# Patient Record
Sex: Female | Born: 1935 | Race: White | Hispanic: No | Smoking: Former smoker
Health system: Southern US, Community
[De-identification: ages and names within clinical notes are randomized; demographics above are authoritative.]

## PROBLEM LIST (undated history)

## (undated) DIAGNOSIS — T7840XA Allergy, unspecified, initial encounter: Secondary | ICD-10-CM

## (undated) DIAGNOSIS — J45909 Unspecified asthma, uncomplicated: Secondary | ICD-10-CM

## (undated) DIAGNOSIS — K5792 Diverticulitis of intestine, part unspecified, without perforation or abscess without bleeding: Secondary | ICD-10-CM

## (undated) DIAGNOSIS — R7881 Bacteremia: Secondary | ICD-10-CM

## (undated) DIAGNOSIS — B9620 Unspecified Escherichia coli [E. coli] as the cause of diseases classified elsewhere: Secondary | ICD-10-CM

## (undated) DIAGNOSIS — C801 Malignant (primary) neoplasm, unspecified: Secondary | ICD-10-CM

## (undated) DIAGNOSIS — S92354A Nondisplaced fracture of fifth metatarsal bone, right foot, initial encounter for closed fracture: Secondary | ICD-10-CM

## (undated) DIAGNOSIS — M069 Rheumatoid arthritis, unspecified: Secondary | ICD-10-CM

## (undated) DIAGNOSIS — J449 Chronic obstructive pulmonary disease, unspecified: Secondary | ICD-10-CM

## (undated) DIAGNOSIS — N390 Urinary tract infection, site not specified: Secondary | ICD-10-CM

## (undated) DIAGNOSIS — J189 Pneumonia, unspecified organism: Secondary | ICD-10-CM

## (undated) DIAGNOSIS — M797 Fibromyalgia: Secondary | ICD-10-CM

## (undated) DIAGNOSIS — J069 Acute upper respiratory infection, unspecified: Secondary | ICD-10-CM

## (undated) DIAGNOSIS — M1990 Unspecified osteoarthritis, unspecified site: Secondary | ICD-10-CM

## (undated) DIAGNOSIS — E2749 Other adrenocortical insufficiency: Secondary | ICD-10-CM

## (undated) DIAGNOSIS — L509 Urticaria, unspecified: Secondary | ICD-10-CM

## (undated) DIAGNOSIS — M810 Age-related osteoporosis without current pathological fracture: Secondary | ICD-10-CM

## (undated) DIAGNOSIS — H269 Unspecified cataract: Secondary | ICD-10-CM

## (undated) DIAGNOSIS — A419 Sepsis, unspecified organism: Secondary | ICD-10-CM

## (undated) HISTORY — DX: Allergy, unspecified, initial encounter: T78.40XA

## (undated) HISTORY — PX: BILATERAL CARPAL TUNNEL RELEASE: SHX6508

## (undated) HISTORY — PX: ROTATOR CUFF REPAIR: SHX139

## (undated) HISTORY — DX: Diverticulitis of intestine, part unspecified, without perforation or abscess without bleeding: K57.92

## (undated) HISTORY — DX: Acute upper respiratory infection, unspecified: J06.9

## (undated) HISTORY — DX: Urinary tract infection, site not specified: N39.0

## (undated) HISTORY — DX: Unspecified osteoarthritis, unspecified site: M19.90

## (undated) HISTORY — DX: Nondisplaced fracture of fifth metatarsal bone, right foot, initial encounter for closed fracture: S92.354A

## (undated) HISTORY — DX: Age-related osteoporosis without current pathological fracture: M81.0

## (undated) HISTORY — DX: Unspecified asthma, uncomplicated: J45.909

## (undated) HISTORY — DX: Urticaria, unspecified: L50.9

## (undated) HISTORY — DX: Malignant (primary) neoplasm, unspecified: C80.1

## (undated) HISTORY — DX: Other adrenocortical insufficiency: E27.49

## (undated) HISTORY — PX: SQUAMOUS CELL CARCINOMA EXCISION: SHX2433

## (undated) HISTORY — DX: Sepsis, unspecified organism: A41.9

## (undated) HISTORY — PX: TOTAL SHOULDER ARTHROPLASTY: SHX126

## (undated) HISTORY — DX: Rheumatoid arthritis, unspecified: M06.9

## (undated) HISTORY — PX: BACK SURGERY: SHX140

## (undated) HISTORY — PX: CERVICAL FUSION: SHX112

## (undated) HISTORY — DX: Fibromyalgia: M79.7

## (undated) HISTORY — DX: Pneumonia, unspecified organism: J18.9

## (undated) HISTORY — DX: Unspecified cataract: H26.9

---
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
## 2002-12-11 HISTORY — PX: CATARACT EXTRACTION, BILATERAL: SHX1313

## 2002-12-11 HISTORY — PX: HEEL SPUR SURGERY: SHX665

## 2003-12-12 HISTORY — PX: BILATERAL CARPAL TUNNEL RELEASE: SHX6508

## 2004-09-07 ENCOUNTER — Encounter: Admission: RE | Admit: 2004-09-07 | Discharge: 2004-09-07 | Payer: Self-pay | Admitting: Internal Medicine

## 2005-02-06 ENCOUNTER — Encounter: Admission: RE | Admit: 2005-02-06 | Discharge: 2005-02-06 | Payer: Self-pay | Admitting: Gastroenterology

## 2005-02-15 ENCOUNTER — Other Ambulatory Visit: Admission: RE | Admit: 2005-02-15 | Discharge: 2005-02-15 | Payer: Self-pay | Admitting: Obstetrics and Gynecology

## 2005-10-11 ENCOUNTER — Encounter: Admission: RE | Admit: 2005-10-11 | Discharge: 2005-10-11 | Payer: Self-pay | Admitting: Internal Medicine

## 2006-03-21 ENCOUNTER — Other Ambulatory Visit: Admission: RE | Admit: 2006-03-21 | Discharge: 2006-03-21 | Payer: Self-pay | Admitting: Obstetrics and Gynecology

## 2006-10-22 ENCOUNTER — Encounter: Admission: RE | Admit: 2006-10-22 | Discharge: 2006-10-22 | Payer: Self-pay | Admitting: Internal Medicine

## 2006-12-11 DIAGNOSIS — M797 Fibromyalgia: Secondary | ICD-10-CM

## 2006-12-11 HISTORY — DX: Fibromyalgia: M79.7

## 2007-10-24 ENCOUNTER — Encounter: Admission: RE | Admit: 2007-10-24 | Discharge: 2007-10-24 | Payer: Self-pay | Admitting: Internal Medicine

## 2009-07-21 ENCOUNTER — Encounter: Admission: RE | Admit: 2009-07-21 | Discharge: 2009-07-21 | Payer: Self-pay

## 2009-08-06 ENCOUNTER — Encounter: Admission: RE | Admit: 2009-08-06 | Discharge: 2009-08-06 | Payer: Self-pay | Admitting: Neurosurgery

## 2009-10-14 ENCOUNTER — Encounter: Admission: RE | Admit: 2009-10-14 | Discharge: 2009-10-14 | Payer: Self-pay | Admitting: Neurosurgery

## 2009-12-11 HISTORY — PX: OTHER SURGICAL HISTORY: SHX169

## 2009-12-27 ENCOUNTER — Encounter: Admission: RE | Admit: 2009-12-27 | Discharge: 2009-12-27 | Payer: Self-pay | Admitting: Rheumatology

## 2010-04-19 ENCOUNTER — Inpatient Hospital Stay (HOSPITAL_COMMUNITY): Admission: RE | Admit: 2010-04-19 | Discharge: 2010-04-22 | Payer: Self-pay | Admitting: Neurosurgery

## 2010-07-25 ENCOUNTER — Encounter: Admission: RE | Admit: 2010-07-25 | Discharge: 2010-10-23 | Payer: Self-pay | Admitting: Neurosurgery

## 2010-09-16 ENCOUNTER — Encounter
Admission: RE | Admit: 2010-09-16 | Discharge: 2010-09-16 | Payer: Self-pay | Source: Home / Self Care | Admitting: Anesthesiology

## 2010-12-08 ENCOUNTER — Ambulatory Visit (HOSPITAL_COMMUNITY)
Admission: RE | Admit: 2010-12-08 | Discharge: 2010-12-08 | Payer: Self-pay | Source: Home / Self Care | Attending: Anesthesiology | Admitting: Anesthesiology

## 2010-12-29 ENCOUNTER — Encounter
Admission: RE | Admit: 2010-12-29 | Discharge: 2011-01-10 | Payer: Self-pay | Source: Home / Self Care | Attending: Neurosurgery | Admitting: Neurosurgery

## 2011-01-12 ENCOUNTER — Encounter: Payer: Self-pay | Admitting: Physical Therapy

## 2011-01-16 ENCOUNTER — Encounter: Payer: Medicare Other | Admitting: Physical Therapy

## 2011-01-18 ENCOUNTER — Encounter: Payer: Medicare Other | Admitting: Physical Therapy

## 2011-01-23 ENCOUNTER — Encounter: Payer: Medicare Other | Admitting: Physical Therapy

## 2011-01-25 ENCOUNTER — Encounter: Payer: Medicare Other | Admitting: Physical Therapy

## 2011-01-30 ENCOUNTER — Encounter: Payer: Medicare Other | Admitting: Physical Therapy

## 2011-02-01 ENCOUNTER — Encounter: Payer: Medicare Other | Admitting: Physical Therapy

## 2011-02-28 LAB — DIFFERENTIAL
Basophils Absolute: 0.1 10*3/uL (ref 0.0–0.1)
Basophils Relative: 1 % (ref 0–1)
Eosinophils Absolute: 0.2 10*3/uL (ref 0.0–0.7)
Eosinophils Relative: 2 % (ref 0–5)
Lymphocytes Relative: 18 % (ref 12–46)
Lymphs Abs: 1.9 10*3/uL (ref 0.7–4.0)
Monocytes Absolute: 0.8 10*3/uL (ref 0.1–1.0)
Monocytes Relative: 7 % (ref 3–12)
Neutro Abs: 8 10*3/uL — ABNORMAL HIGH (ref 1.7–7.7)
Neutrophils Relative %: 73 % (ref 43–77)

## 2011-02-28 LAB — SURGICAL PCR SCREEN
MRSA, PCR: NEGATIVE
Staphylococcus aureus: NEGATIVE

## 2011-02-28 LAB — URINALYSIS, ROUTINE W REFLEX MICROSCOPIC
Bilirubin Urine: NEGATIVE
Glucose, UA: NEGATIVE mg/dL
Ketones, ur: NEGATIVE mg/dL
Leukocytes, UA: NEGATIVE
Nitrite: NEGATIVE
Protein, ur: NEGATIVE mg/dL
Specific Gravity, Urine: 1.007 (ref 1.005–1.030)
Urobilinogen, UA: 0.2 mg/dL (ref 0.0–1.0)
pH: 7.5 (ref 5.0–8.0)

## 2011-02-28 LAB — CBC
HCT: 37 % (ref 36.0–46.0)
Hemoglobin: 13 g/dL (ref 12.0–15.0)
MCHC: 35 g/dL (ref 30.0–36.0)
MCV: 96.7 fL (ref 78.0–100.0)
Platelets: 298 10*3/uL (ref 150–400)
RBC: 3.83 MIL/uL — ABNORMAL LOW (ref 3.87–5.11)
RDW: 14.9 % (ref 11.5–15.5)
WBC: 10.9 10*3/uL — ABNORMAL HIGH (ref 4.0–10.5)

## 2011-02-28 LAB — MRSA PCR SCREENING: MRSA by PCR: NEGATIVE

## 2011-02-28 LAB — COMPREHENSIVE METABOLIC PANEL
ALT: 21 U/L (ref 0–35)
AST: 26 U/L (ref 0–37)
Albumin: 4 g/dL (ref 3.5–5.2)
Alkaline Phosphatase: 69 U/L (ref 39–117)
BUN: 17 mg/dL (ref 6–23)
CO2: 36 mEq/L — ABNORMAL HIGH (ref 19–32)
Calcium: 9.5 mg/dL (ref 8.4–10.5)
Chloride: 100 mEq/L (ref 96–112)
Creatinine, Ser: 0.81 mg/dL (ref 0.4–1.2)
GFR calc Af Amer: >60 mL/min (ref >60)
GFR calc non Af Amer: >60 mL/min (ref >60)
Glucose, Bld: 102 mg/dL — ABNORMAL HIGH (ref 70–99)
Potassium: 4.1 mEq/L (ref 3.5–5.1)
Sodium: 139 mEq/L (ref 135–145)
Total Bilirubin: 0.8 mg/dL (ref 0.3–1.2)
Total Protein: 7.6 g/dL (ref 6.0–8.3)

## 2011-02-28 LAB — PROTIME-INR
INR: 0.94 (ref 0.00–1.49)
Prothrombin Time: 12.5 seconds (ref 11.6–15.2)

## 2011-02-28 LAB — URINE MICROSCOPIC-ADD ON

## 2011-02-28 LAB — NO BLOOD PRODUCTS

## 2011-02-28 LAB — APTT: aPTT: 29 seconds (ref 24–37)

## 2011-03-28 ENCOUNTER — Ambulatory Visit: Payer: Medicare Other | Attending: Neurosurgery

## 2011-03-28 DIAGNOSIS — M545 Low back pain, unspecified: Secondary | ICD-10-CM | POA: Insufficient documentation

## 2011-03-28 DIAGNOSIS — M2560 Stiffness of unspecified joint, not elsewhere classified: Secondary | ICD-10-CM | POA: Insufficient documentation

## 2011-03-28 DIAGNOSIS — IMO0001 Reserved for inherently not codable concepts without codable children: Secondary | ICD-10-CM | POA: Insufficient documentation

## 2011-03-28 DIAGNOSIS — M542 Cervicalgia: Secondary | ICD-10-CM | POA: Insufficient documentation

## 2011-03-28 DIAGNOSIS — R5381 Other malaise: Secondary | ICD-10-CM | POA: Insufficient documentation

## 2011-03-29 ENCOUNTER — Ambulatory Visit: Payer: Medicare Other | Admitting: Physical Therapy

## 2011-04-06 ENCOUNTER — Ambulatory Visit: Payer: Medicare Other

## 2011-04-11 ENCOUNTER — Ambulatory Visit: Payer: Medicare Other | Attending: Neurosurgery

## 2011-04-11 DIAGNOSIS — M545 Low back pain, unspecified: Secondary | ICD-10-CM | POA: Insufficient documentation

## 2011-04-11 DIAGNOSIS — R5381 Other malaise: Secondary | ICD-10-CM | POA: Insufficient documentation

## 2011-04-11 DIAGNOSIS — IMO0001 Reserved for inherently not codable concepts without codable children: Secondary | ICD-10-CM | POA: Insufficient documentation

## 2011-04-11 DIAGNOSIS — M542 Cervicalgia: Secondary | ICD-10-CM | POA: Insufficient documentation

## 2011-04-11 DIAGNOSIS — M2560 Stiffness of unspecified joint, not elsewhere classified: Secondary | ICD-10-CM | POA: Insufficient documentation

## 2011-04-13 ENCOUNTER — Ambulatory Visit: Payer: Medicare Other

## 2011-04-18 ENCOUNTER — Ambulatory Visit: Payer: Medicare Other

## 2011-04-20 ENCOUNTER — Ambulatory Visit: Payer: Medicare Other

## 2011-04-25 ENCOUNTER — Ambulatory Visit: Payer: Medicare Other

## 2011-04-27 ENCOUNTER — Ambulatory Visit: Payer: Medicare Other

## 2011-05-03 ENCOUNTER — Ambulatory Visit: Payer: Medicare Other

## 2011-05-04 ENCOUNTER — Ambulatory Visit: Payer: Medicare Other

## 2011-05-09 ENCOUNTER — Ambulatory Visit: Payer: Medicare Other

## 2011-05-11 ENCOUNTER — Ambulatory Visit: Payer: Medicare Other | Attending: Rheumatology

## 2011-05-11 ENCOUNTER — Encounter: Payer: Medicare Other | Admitting: Physical Therapy

## 2011-05-11 DIAGNOSIS — IMO0001 Reserved for inherently not codable concepts without codable children: Secondary | ICD-10-CM | POA: Insufficient documentation

## 2011-05-11 DIAGNOSIS — M25659 Stiffness of unspecified hip, not elsewhere classified: Secondary | ICD-10-CM | POA: Insufficient documentation

## 2011-05-11 DIAGNOSIS — M25569 Pain in unspecified knee: Secondary | ICD-10-CM | POA: Insufficient documentation

## 2011-05-11 DIAGNOSIS — R5381 Other malaise: Secondary | ICD-10-CM | POA: Insufficient documentation

## 2011-05-15 ENCOUNTER — Ambulatory Visit: Payer: Medicare Other | Attending: Neurosurgery | Admitting: Physical Therapy

## 2011-05-15 DIAGNOSIS — M542 Cervicalgia: Secondary | ICD-10-CM | POA: Insufficient documentation

## 2011-05-15 DIAGNOSIS — IMO0001 Reserved for inherently not codable concepts without codable children: Secondary | ICD-10-CM | POA: Insufficient documentation

## 2011-05-15 DIAGNOSIS — M545 Low back pain, unspecified: Secondary | ICD-10-CM | POA: Insufficient documentation

## 2011-05-15 DIAGNOSIS — R5381 Other malaise: Secondary | ICD-10-CM | POA: Insufficient documentation

## 2011-05-15 DIAGNOSIS — M2560 Stiffness of unspecified joint, not elsewhere classified: Secondary | ICD-10-CM | POA: Insufficient documentation

## 2011-05-18 ENCOUNTER — Ambulatory Visit: Payer: Medicare Other

## 2011-05-25 ENCOUNTER — Ambulatory Visit: Payer: Medicare Other

## 2011-05-29 ENCOUNTER — Ambulatory Visit: Payer: Medicare Other | Admitting: Physical Therapy

## 2011-06-01 ENCOUNTER — Ambulatory Visit: Payer: Medicare Other | Admitting: Physical Therapy

## 2011-06-05 ENCOUNTER — Ambulatory Visit: Payer: Medicare Other | Admitting: Physical Therapy

## 2011-06-08 ENCOUNTER — Ambulatory Visit: Payer: Medicare Other

## 2011-06-12 ENCOUNTER — Encounter (HOSPITAL_COMMUNITY): Payer: Medicare Other | Attending: Rheumatology

## 2011-06-12 DIAGNOSIS — M069 Rheumatoid arthritis, unspecified: Secondary | ICD-10-CM | POA: Insufficient documentation

## 2011-06-26 ENCOUNTER — Encounter (HOSPITAL_COMMUNITY): Payer: Medicare Other

## 2011-07-10 ENCOUNTER — Encounter (HOSPITAL_COMMUNITY): Payer: Medicare Other

## 2011-08-10 ENCOUNTER — Encounter (HOSPITAL_COMMUNITY): Payer: Medicare Other | Attending: Rheumatology

## 2011-08-10 DIAGNOSIS — M069 Rheumatoid arthritis, unspecified: Secondary | ICD-10-CM | POA: Insufficient documentation

## 2011-08-31 ENCOUNTER — Other Ambulatory Visit: Payer: Self-pay | Admitting: Internal Medicine

## 2011-08-31 DIAGNOSIS — Z1231 Encounter for screening mammogram for malignant neoplasm of breast: Secondary | ICD-10-CM

## 2011-09-11 ENCOUNTER — Encounter (HOSPITAL_COMMUNITY): Payer: Medicare Other | Attending: Rheumatology

## 2011-09-11 DIAGNOSIS — M069 Rheumatoid arthritis, unspecified: Secondary | ICD-10-CM | POA: Insufficient documentation

## 2011-09-19 ENCOUNTER — Ambulatory Visit
Admission: RE | Admit: 2011-09-19 | Discharge: 2011-09-19 | Disposition: A | Discharge status: Home / Self Care | Payer: Medicare Other | Source: Ambulatory Visit | Attending: Internal Medicine | Admitting: Internal Medicine

## 2011-09-19 DIAGNOSIS — Z1231 Encounter for screening mammogram for malignant neoplasm of breast: Secondary | ICD-10-CM

## 2011-10-09 ENCOUNTER — Encounter (HOSPITAL_COMMUNITY): Payer: Medicare Other

## 2011-10-31 ENCOUNTER — Other Ambulatory Visit (HOSPITAL_COMMUNITY): Payer: Self-pay | Admitting: *Deleted

## 2011-11-06 ENCOUNTER — Encounter (HOSPITAL_COMMUNITY): Payer: Medicare Other

## 2012-03-12 ENCOUNTER — Encounter: Payer: Self-pay | Admitting: Physical Therapy

## 2012-03-12 ENCOUNTER — Ambulatory Visit: Payer: Medicare Other | Attending: Rheumatology | Admitting: *Deleted

## 2012-03-12 DIAGNOSIS — IMO0001 Reserved for inherently not codable concepts without codable children: Secondary | ICD-10-CM | POA: Insufficient documentation

## 2012-03-12 DIAGNOSIS — M2560 Stiffness of unspecified joint, not elsewhere classified: Secondary | ICD-10-CM | POA: Insufficient documentation

## 2012-03-12 DIAGNOSIS — R5381 Other malaise: Secondary | ICD-10-CM | POA: Insufficient documentation

## 2012-03-12 DIAGNOSIS — M6281 Muscle weakness (generalized): Secondary | ICD-10-CM | POA: Insufficient documentation

## 2012-03-14 ENCOUNTER — Ambulatory Visit: Payer: Medicare Other | Admitting: *Deleted

## 2012-03-18 ENCOUNTER — Ambulatory Visit: Payer: Medicare Other | Admitting: *Deleted

## 2012-03-20 ENCOUNTER — Ambulatory Visit: Payer: Medicare Other | Admitting: *Deleted

## 2012-03-25 ENCOUNTER — Ambulatory Visit: Payer: Medicare Other | Admitting: Physical Therapy

## 2012-03-28 ENCOUNTER — Ambulatory Visit: Payer: Medicare Other | Admitting: *Deleted

## 2012-04-01 ENCOUNTER — Ambulatory Visit: Payer: Medicare Other | Admitting: Physical Therapy

## 2012-04-03 ENCOUNTER — Ambulatory Visit: Payer: Medicare Other | Admitting: Physical Therapy

## 2012-04-08 ENCOUNTER — Ambulatory Visit: Payer: Medicare Other | Admitting: Physical Therapy

## 2012-04-10 ENCOUNTER — Ambulatory Visit: Payer: Medicare Other | Attending: Rheumatology | Admitting: Physical Therapy

## 2012-04-10 DIAGNOSIS — M6281 Muscle weakness (generalized): Secondary | ICD-10-CM | POA: Insufficient documentation

## 2012-04-10 DIAGNOSIS — IMO0001 Reserved for inherently not codable concepts without codable children: Secondary | ICD-10-CM | POA: Insufficient documentation

## 2012-04-10 DIAGNOSIS — R5381 Other malaise: Secondary | ICD-10-CM | POA: Insufficient documentation

## 2012-04-10 DIAGNOSIS — M2560 Stiffness of unspecified joint, not elsewhere classified: Secondary | ICD-10-CM | POA: Insufficient documentation

## 2012-04-15 ENCOUNTER — Ambulatory Visit: Payer: Medicare Other | Admitting: Physical Therapy

## 2012-04-17 ENCOUNTER — Ambulatory Visit: Payer: Medicare Other | Admitting: Physical Therapy

## 2012-04-22 ENCOUNTER — Ambulatory Visit: Payer: Medicare Other | Admitting: Physical Therapy

## 2012-04-24 ENCOUNTER — Ambulatory Visit: Payer: Medicare Other | Admitting: Physical Therapy

## 2012-04-29 ENCOUNTER — Ambulatory Visit: Payer: Medicare Other | Admitting: Physical Therapy

## 2012-05-01 ENCOUNTER — Ambulatory Visit: Payer: Medicare Other | Admitting: Physical Therapy

## 2012-08-30 ENCOUNTER — Other Ambulatory Visit: Payer: Self-pay | Admitting: Internal Medicine

## 2012-08-30 DIAGNOSIS — Z1231 Encounter for screening mammogram for malignant neoplasm of breast: Secondary | ICD-10-CM

## 2012-09-24 ENCOUNTER — Ambulatory Visit
Admission: RE | Admit: 2012-09-24 | Discharge: 2012-09-24 | Disposition: A | Discharge status: Home / Self Care | Payer: Medicare Other | Source: Ambulatory Visit | Attending: Internal Medicine | Admitting: Internal Medicine

## 2012-09-24 DIAGNOSIS — Z1231 Encounter for screening mammogram for malignant neoplasm of breast: Secondary | ICD-10-CM

## 2012-10-30 ENCOUNTER — Telehealth: Payer: Self-pay | Admitting: Internal Medicine

## 2012-10-30 NOTE — Telephone Encounter (Signed)
S/W PT IN REF TO NP APPT. ON 11/13/12@1 :30 REFERRING DR Corliss Skains DX-LYMPHOCYTOSIS MAILED NP PACKET

## 2012-10-31 ENCOUNTER — Telehealth: Payer: Self-pay | Admitting: Internal Medicine

## 2012-10-31 NOTE — Telephone Encounter (Signed)
C/D 10/31/12 for appt.11/23/12

## 2012-11-13 ENCOUNTER — Encounter: Payer: Self-pay | Admitting: Internal Medicine

## 2012-11-13 ENCOUNTER — Other Ambulatory Visit (HOSPITAL_BASED_OUTPATIENT_CLINIC_OR_DEPARTMENT_OTHER): Payer: Medicare Other | Admitting: Lab

## 2012-11-13 ENCOUNTER — Ambulatory Visit: Payer: Medicare Other

## 2012-11-13 ENCOUNTER — Ambulatory Visit (HOSPITAL_BASED_OUTPATIENT_CLINIC_OR_DEPARTMENT_OTHER): Payer: Medicare Other | Admitting: Internal Medicine

## 2012-11-13 VITALS — BP 134/86 | HR 83 | Temp 97.2°F | Resp 18 | Ht 65.0 in | Wt 166.2 lb

## 2012-11-13 DIAGNOSIS — M069 Rheumatoid arthritis, unspecified: Secondary | ICD-10-CM

## 2012-11-13 DIAGNOSIS — M810 Age-related osteoporosis without current pathological fracture: Secondary | ICD-10-CM

## 2012-11-13 DIAGNOSIS — D72820 Lymphocytosis (symptomatic): Secondary | ICD-10-CM

## 2012-11-13 DIAGNOSIS — D72829 Elevated white blood cell count, unspecified: Secondary | ICD-10-CM | POA: Insufficient documentation

## 2012-11-13 LAB — CBC WITH DIFFERENTIAL/PLATELET
BASO%: 0.9 % (ref 0.0–2.0)
Basophils Absolute: 0.1 10*3/uL (ref 0.0–0.1)
EOS%: 0.1 % (ref 0.0–7.0)
Eosinophils Absolute: 0 10*3/uL (ref 0.0–0.5)
HCT: 38.4 % (ref 34.8–46.6)
HGB: 12.9 g/dL (ref 11.6–15.9)
LYMPH%: 21.6 % (ref 14.0–49.7)
MCH: 34.2 pg — ABNORMAL HIGH (ref 25.1–34.0)
MCHC: 33.4 g/dL (ref 31.5–36.0)
MCV: 102.2 fL — ABNORMAL HIGH (ref 79.5–101.0)
MONO#: 0.3 10*3/uL (ref 0.1–0.9)
MONO%: 3.4 % (ref 0.0–14.0)
NEUT#: 6.5 10*3/uL (ref 1.5–6.5)
NEUT%: 74 % (ref 38.4–76.8)
Platelets: 310 10*3/uL (ref 145–400)
RBC: 3.76 10*6/uL (ref 3.70–5.45)
RDW: 14.2 % (ref 11.2–14.5)
WBC: 8.8 10*3/uL (ref 3.9–10.3)
lymph#: 1.9 10*3/uL (ref 0.9–3.3)

## 2012-11-13 NOTE — Progress Notes (Signed)
Checked in new pt with no financial concerns. °

## 2012-11-13 NOTE — Patient Instructions (Signed)
Your lymphocytosis resolved. Followup with your family doctor

## 2012-11-13 NOTE — Progress Notes (Signed)
[facility] Telephone:([phone]) [phone]   Fax:([phone]) [phone]  CONSULT NOTE  REASON FOR CONSULTATION:  78 years old white female here for evaluation of lymphocytosis.  HPI Eileen Wilkerson is a 78 y.o. female was past medical history significant for rheumatoid arthritis, fibromyalgia, adrenal insufficiency, dyslipidemia and osteoporosis. The patient was followed by Dr. Corliss Skains for rheumatoid arthritis and fibromyalgia. The patient has been on treatment with methotrexate as well as Humira for several months. 2 months ago she had bronchitis and her treatment for the rheumatoid arthritis was discontinued. She had flare of her symptoms and was started again on treatment with methotrexate and Enbrel for the last 6 weeks. Routine CBC on 08/13/2012 and it showed normal total white blood count of 7.4 but the absolute lymphocyte count was 5000 with normal neutrophil count of 1700 but lower total percentage of 23%. Repeat CBC on 10/22/2012 showed that total white blood count of 9.0 with absolute lymphocyte count of 6400 representing 71% of the total white blood count. The patient was referred to me today for evaluation and recommendation regarding her lymphocytosis. She is currently on treatment with methotrexate,Ebrel and prednisone for her rheumatoid arthritis. The patient also takes folic acid on daily basis. She still complaining of joint pain all over her body. She also has lack of energy. She also has some intermittent chest pain but she related mainly to her hiatal hernia. The patient denied having any significant weight loss or night sweats. She has no bleeding issues. Her family history significant for a father with heart disease and diabetes mellitus. Her mother had breast cancer. The patient is married and has 3 children. 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 She has a remote history of smoking for few years. She denied having any history of alcohol or drug abuse. The patient has a  big listt of allergy to medication as listed below.   @SFHPI @  Past Medical History  Diagnosis Date  . Cataract   . Osteoporosis   . Arthritis   . Adrenal failure   . Fibromyalgia 2008    Past Surgical History  Procedure Date  . Cervical fusion 2011,2010,2008    c 3--4,4-5, lower back  . Carpel tunnel release 2005    bilateral wrists  . Heel spur surgery 2004  . Cataract extraction, bilateral 2004  . Rotater cuff [id number]  . Tonsillectomy and adenoidectomy 1947  . Back surgery     No family history on file.  Social History History  Substance Use Topics  . Smoking status: Not on file  . Smokeless tobacco: Not on file  . Alcohol Use: Not on file    Allergies  Allergen Reactions  . Adhesive (Tape) Rash  . Celebrex (Celecoxib) Hives  . Cymbalta (Duloxetine Hcl) Swelling  . Gabitril (Tiagabine) Swelling  . Keflex (Cephalexin) Nausea And Vomiting  . Lyrica (Pregabalin) Swelling  . Neurontin (Gabapentin) Swelling  . Nexium (Esomeprazole) Rash  . Nsaids Rash  . Penicillins Rash    Injection site reaction  . Shrimp (Shellfish Allergy) Anaphylaxis  . Sulfa Antibiotics Other (See Comments)    "deathly ill"/nausea    Current Outpatient Prescriptions  Medication Sig Dispense Refill  . acetaminophen (TYLENOL) 325 MG tablet Take 650 mg by mouth 2 (two) times daily.      Marland Kitchen atorvastatin (LIPITOR) 20 MG tablet Take 20 mg by mouth At bedtime.      Marland Kitchen BIOTIN 5000 PO Take 1 tablet by mouth 2 (two) times daily.      Marland Kitchen  calcium carbonate (OS-CAL) 600 MG TABS Take 600 mg by mouth 2 (two) times daily with a meal.      . co-enzyme Q-10 30 MG capsule Take 100 mg by mouth daily. Take 2 tablets daily      . ENBREL SURECLICK 50 MG/ML injection Inject 50 mg into the muscle Once a week.      . folic acid (FOLVITE) 1 MG tablet Take 1 mg by mouth Daily.      . furosemide (LASIX) 40 MG tablet Take 60 mg by mouth Daily.      . methocarbamol (ROBAXIN) 500 MG tablet Take 500 mg by mouth 3  (three) times daily.       . methotrexate 25 MG/ML injection Inject 0.8 mLs into the skin Once a week.      . predniSONE (DELTASONE) 5 MG tablet Take 10 mg by mouth Daily.      Marland Kitchen PREVACID 24HR 15 MG capsule Take 15 mg by mouth Daily.      Marland Kitchen SAVELLA 50 MG TABS Take 50 mg by mouth 2 (two) times daily.       . traMADol (ULTRAM) 50 MG tablet Take 50 mg by mouth Twice daily.      . Zoledronic Acid (RECLAST IV) Inject into the vein. Once a year        Review of Systems  A comprehensive review of systems was negative except for: Constitutional: positive for fatigue Musculoskeletal: positive for arthralgias, myalgias and stiff joints  Physical Exam  ZOX:WRUEA, healthy, no distress, well nourished and well developed SKIN: skin color, texture, turgor are normal HEAD: Normocephalic, No masses, lesions, tenderness or abnormalities EYES: normal, PERRLA EARS: External ears normal OROPHARYNX:no exudate and no erythema  NECK: supple, no adenopathy LYMPH:  no palpable lymphadenopathy, no hepatosplenomegaly BREAST:not examined LUNGS: clear to auscultation  HEART: regular rate & rhythm and no murmurs ABDOMEN:abdomen soft and non-tender BACK: Back symmetric, no curvature. EXTREMITIES:no edema, no skin discoloration, no clubbing  NEURO: alert & oriented x 3 with fluent speech, no focal motor/sensory deficits  PERFORMANCE STATUS: ECOG 1  LABORATORY DATA: Lab Results  Component Value Date   WBC 8.8 11/13/2012   HGB 12.9 11/13/2012   HCT 38.4 11/13/2012   MCV 102.2* 11/13/2012   PLT 310 11/13/2012      Chemistry      Component Value Date/Time   NA 139 04/12/2010 1120   K 4.1 04/12/2010 1120   CL 100 04/12/2010 1120   CO2 36* 04/12/2010 1120   BUN 17 04/12/2010 1120   CREATININE 0.81 04/12/2010 1120      Component Value Date/Time   CALCIUM 9.5 04/12/2010 1120   ALKPHOS 69 04/12/2010 1120   AST 26 04/12/2010 1120   ALT 21 04/12/2010 1120   BILITOT 0.8 04/12/2010 1120       RADIOGRAPHIC STUDIES: No  results found.  ASSESSMENT: This is a very pleasant 78 years old white female who presented for evaluation of lymphocytosis which completely resolved at this point and this was most likely secondary to her recent upper respiratory infection plus/minus drug-induced from her rheumatoid arthritis treatment. Her lymphocytosis completely resolved at this time.  PLAN: I discussed the lab result with the patient. I recommended for her to continue her routine care with Dr. Corliss Skains and her primary care physician. I don't see a need for any further intervention at this point as her condition resolved. I would be happy to see her in the future if needed. The patient was  advised to call me immediately if she has any other significant abnormalities that require hematologic evaluation. The patient agreed to the current plan. She was given a copy of her blood work today.  All questions were answered. The patient knows to call the clinic with any problems, questions or concerns. We can certainly see the patient much sooner if necessary.  Thank you so much for allowing me to participate in the care of Eileen Wilkerson. I will continue to follow up the patient with you and assist in her care.  I spent 25 minutes counseling the patient face to face. The total time spent in the appointment was 50 minutes.  Rodolfo Gaster K. 11/13/2012, 2:59 PM

## 2013-07-22 ENCOUNTER — Other Ambulatory Visit: Payer: Self-pay | Admitting: Endocrinology

## 2013-07-22 DIAGNOSIS — E049 Nontoxic goiter, unspecified: Secondary | ICD-10-CM

## 2013-07-29 ENCOUNTER — Ambulatory Visit
Admission: RE | Admit: 2013-07-29 | Discharge: 2013-07-29 | Disposition: A | Discharge status: Home / Self Care | Payer: Medicare Other | Source: Ambulatory Visit | Attending: Endocrinology | Admitting: Endocrinology

## 2013-07-29 DIAGNOSIS — E049 Nontoxic goiter, unspecified: Secondary | ICD-10-CM

## 2013-08-27 ENCOUNTER — Other Ambulatory Visit: Payer: Self-pay

## 2013-08-27 DIAGNOSIS — Z1231 Encounter for screening mammogram for malignant neoplasm of breast: Secondary | ICD-10-CM

## 2013-09-25 ENCOUNTER — Ambulatory Visit
Admission: RE | Admit: 2013-09-25 | Discharge: 2013-09-25 | Disposition: A | Discharge status: Home / Self Care | Payer: Medicare Other | Source: Ambulatory Visit

## 2013-09-25 DIAGNOSIS — Z1231 Encounter for screening mammogram for malignant neoplasm of breast: Secondary | ICD-10-CM

## 2014-09-14 ENCOUNTER — Other Ambulatory Visit: Payer: Self-pay

## 2014-09-14 DIAGNOSIS — Z1239 Encounter for other screening for malignant neoplasm of breast: Secondary | ICD-10-CM

## 2014-09-29 ENCOUNTER — Other Ambulatory Visit: Payer: Self-pay

## 2014-09-29 DIAGNOSIS — Z1231 Encounter for screening mammogram for malignant neoplasm of breast: Secondary | ICD-10-CM

## 2014-10-01 ENCOUNTER — Ambulatory Visit
Admission: RE | Admit: 2014-10-01 | Discharge: 2014-10-01 | Disposition: A | Discharge status: Home / Self Care | Payer: 59 | Source: Ambulatory Visit

## 2014-10-01 DIAGNOSIS — Z1231 Encounter for screening mammogram for malignant neoplasm of breast: Secondary | ICD-10-CM

## 2014-10-26 ENCOUNTER — Other Ambulatory Visit (HOSPITAL_COMMUNITY): Payer: Self-pay | Admitting: Internal Medicine

## 2014-10-26 ENCOUNTER — Ambulatory Visit (HOSPITAL_COMMUNITY)
Admission: RE | Admit: 2014-10-26 | Discharge: 2014-10-26 | Disposition: A | Discharge status: Home / Self Care | Payer: Medicare Other | Source: Ambulatory Visit | Attending: Internal Medicine | Admitting: Internal Medicine

## 2014-10-26 DIAGNOSIS — H538 Other visual disturbances: Secondary | ICD-10-CM | POA: Diagnosis present

## 2014-10-26 DIAGNOSIS — I739 Peripheral vascular disease, unspecified: Secondary | ICD-10-CM | POA: Insufficient documentation

## 2014-10-26 DIAGNOSIS — R42 Dizziness and giddiness: Secondary | ICD-10-CM | POA: Diagnosis present

## 2014-10-26 DIAGNOSIS — G319 Degenerative disease of nervous system, unspecified: Secondary | ICD-10-CM | POA: Diagnosis not present

## 2014-10-26 DIAGNOSIS — H9190 Unspecified hearing loss, unspecified ear: Secondary | ICD-10-CM | POA: Diagnosis present

## 2014-10-26 DIAGNOSIS — S0990XA Unspecified injury of head, initial encounter: Secondary | ICD-10-CM

## 2015-01-06 ENCOUNTER — Other Ambulatory Visit: Payer: Self-pay | Admitting: Specialist

## 2015-01-06 DIAGNOSIS — M545 Low back pain: Secondary | ICD-10-CM

## 2015-01-06 DIAGNOSIS — M542 Cervicalgia: Secondary | ICD-10-CM

## 2015-01-19 ENCOUNTER — Ambulatory Visit
Admission: RE | Admit: 2015-01-19 | Discharge: 2015-01-19 | Disposition: A | Discharge status: Home / Self Care | Payer: PRIVATE HEALTH INSURANCE | Source: Ambulatory Visit | Attending: Specialist | Admitting: Specialist

## 2015-01-19 ENCOUNTER — Other Ambulatory Visit: Payer: Self-pay

## 2015-01-19 DIAGNOSIS — M545 Low back pain: Secondary | ICD-10-CM

## 2015-01-19 DIAGNOSIS — M542 Cervicalgia: Secondary | ICD-10-CM

## 2015-01-19 MED ORDER — GADOBENATE DIMEGLUMINE 529 MG/ML IV SOLN
17.0000 mL | Freq: Once | INTRAVENOUS | Status: AC | PRN
  Administered 2015-01-19: 17 mL via INTRAVENOUS

## 2015-02-08 ENCOUNTER — Other Ambulatory Visit: Payer: Self-pay | Admitting: Orthopedic Surgery

## 2015-02-08 DIAGNOSIS — M25512 Pain in left shoulder: Secondary | ICD-10-CM

## 2015-02-13 ENCOUNTER — Ambulatory Visit
Admission: RE | Admit: 2015-02-13 | Discharge: 2015-02-13 | Disposition: A | Discharge status: Home / Self Care | Payer: PRIVATE HEALTH INSURANCE | Source: Ambulatory Visit | Attending: Orthopedic Surgery | Admitting: Orthopedic Surgery

## 2015-02-13 DIAGNOSIS — M25512 Pain in left shoulder: Secondary | ICD-10-CM

## 2015-04-19 ENCOUNTER — Other Ambulatory Visit: Payer: Self-pay | Admitting: Neurosurgery

## 2015-04-19 DIAGNOSIS — M48061 Spinal stenosis, lumbar region without neurogenic claudication: Secondary | ICD-10-CM

## 2015-04-21 ENCOUNTER — Ambulatory Visit
Admission: RE | Admit: 2015-04-21 | Discharge: 2015-04-21 | Disposition: A | Discharge status: Home / Self Care | Payer: PRIVATE HEALTH INSURANCE | Source: Ambulatory Visit | Attending: Neurosurgery | Admitting: Neurosurgery

## 2015-04-21 DIAGNOSIS — M48061 Spinal stenosis, lumbar region without neurogenic claudication: Secondary | ICD-10-CM

## 2015-08-30 ENCOUNTER — Other Ambulatory Visit: Payer: Self-pay

## 2015-08-30 DIAGNOSIS — Z1231 Encounter for screening mammogram for malignant neoplasm of breast: Secondary | ICD-10-CM

## 2015-10-05 ENCOUNTER — Ambulatory Visit
Admission: RE | Admit: 2015-10-05 | Discharge: 2015-10-05 | Disposition: A | Discharge status: Home / Self Care | Payer: Medicare Other | Source: Ambulatory Visit

## 2015-10-05 DIAGNOSIS — Z1231 Encounter for screening mammogram for malignant neoplasm of breast: Secondary | ICD-10-CM

## 2015-12-28 ENCOUNTER — Emergency Department (HOSPITAL_COMMUNITY): Payer: Medicare Other

## 2015-12-28 ENCOUNTER — Encounter (HOSPITAL_COMMUNITY): Payer: Self-pay | Admitting: Emergency Medicine

## 2015-12-28 ENCOUNTER — Inpatient Hospital Stay (HOSPITAL_COMMUNITY)
Admission: EM | Admit: 2015-12-28 | Discharge: 2016-01-02 | DRG: 195 | Disposition: A | Discharge status: Home Health | Payer: Medicare Other | Attending: Family Medicine | Admitting: Family Medicine

## 2015-12-28 DIAGNOSIS — M810 Age-related osteoporosis without current pathological fracture: Secondary | ICD-10-CM | POA: Diagnosis present

## 2015-12-28 DIAGNOSIS — Z79899 Other long term (current) drug therapy: Secondary | ICD-10-CM

## 2015-12-28 DIAGNOSIS — Z7952 Long term (current) use of systemic steroids: Secondary | ICD-10-CM

## 2015-12-28 DIAGNOSIS — R509 Fever, unspecified: Secondary | ICD-10-CM | POA: Diagnosis not present

## 2015-12-28 DIAGNOSIS — Z981 Arthrodesis status: Secondary | ICD-10-CM | POA: Diagnosis not present

## 2015-12-28 DIAGNOSIS — R05 Cough: Secondary | ICD-10-CM

## 2015-12-28 DIAGNOSIS — R059 Cough, unspecified: Secondary | ICD-10-CM

## 2015-12-28 DIAGNOSIS — E860 Dehydration: Secondary | ICD-10-CM | POA: Diagnosis present

## 2015-12-28 DIAGNOSIS — M069 Rheumatoid arthritis, unspecified: Secondary | ICD-10-CM | POA: Diagnosis present

## 2015-12-28 DIAGNOSIS — J189 Pneumonia, unspecified organism: Secondary | ICD-10-CM | POA: Diagnosis present

## 2015-12-28 LAB — COMPREHENSIVE METABOLIC PANEL
ALT: 28 U/L (ref 14–54)
AST: 36 U/L (ref 15–41)
Albumin: 4.4 g/dL (ref 3.5–5.0)
Alkaline Phosphatase: 64 U/L (ref 38–126)
Anion gap: 11 (ref 5–15)
BUN: 17 mg/dL (ref 6–20)
CO2: 29 mmol/L (ref 22–32)
Calcium: 9.3 mg/dL (ref 8.9–10.3)
Chloride: 100 mmol/L — ABNORMAL LOW (ref 101–111)
Creatinine, Ser: 0.86 mg/dL (ref 0.44–1.00)
GFR calc Af Amer: >60 mL/min (ref >60)
GFR calc non Af Amer: >60 mL/min (ref >60)
Glucose, Bld: 132 mg/dL — ABNORMAL HIGH (ref 65–99)
Potassium: 3.1 mmol/L — ABNORMAL LOW (ref 3.5–5.1)
Sodium: 140 mmol/L (ref 135–145)
Total Bilirubin: 1.1 mg/dL (ref 0.3–1.2)
Total Protein: 8.4 g/dL — ABNORMAL HIGH (ref 6.5–8.1)

## 2015-12-28 LAB — CBC WITH DIFFERENTIAL/PLATELET
Basophils Absolute: 0 10*3/uL (ref 0.0–0.1)
Basophils Relative: 0 %
Eosinophils Absolute: 0 10*3/uL (ref 0.0–0.7)
Eosinophils Relative: 1 %
HCT: 43.3 % (ref 36.0–46.0)
Hemoglobin: 13.7 g/dL (ref 12.0–15.0)
Lymphocytes Relative: 17 %
Lymphs Abs: 1.5 10*3/uL (ref 0.7–4.0)
MCH: 33.4 pg (ref 26.0–34.0)
MCHC: 31.6 g/dL (ref 30.0–36.0)
MCV: 105.6 fL — ABNORMAL HIGH (ref 78.0–100.0)
Monocytes Absolute: 0.6 10*3/uL (ref 0.1–1.0)
Monocytes Relative: 7 %
Neutro Abs: 6.3 10*3/uL (ref 1.7–7.7)
Neutrophils Relative %: 75 %
Platelets: 285 10*3/uL (ref 150–400)
RBC: 4.1 MIL/uL (ref 3.87–5.11)
RDW: 15.1 % (ref 11.5–15.5)
WBC: 8.4 10*3/uL (ref 4.0–10.5)

## 2015-12-28 LAB — URINALYSIS, ROUTINE W REFLEX MICROSCOPIC
Bilirubin Urine: NEGATIVE
Glucose, UA: NEGATIVE mg/dL
Ketones, ur: NEGATIVE mg/dL
Leukocytes, UA: NEGATIVE
Nitrite: NEGATIVE
Protein, ur: NEGATIVE mg/dL
Specific Gravity, Urine: 1.012 (ref 1.005–1.030)
pH: 7.5 (ref 5.0–8.0)

## 2015-12-28 LAB — URINE MICROSCOPIC-ADD ON

## 2015-12-28 LAB — I-STAT CG4 LACTIC ACID, ED
Lactic Acid, Venous: 1.92 mmol/L (ref 0.5–2.0)
Lactic Acid, Venous: 1.92 mmol/L (ref 0.5–2.0)

## 2015-12-28 LAB — TROPONIN I
Troponin I: 0.1 ng/mL — ABNORMAL HIGH (ref <0.031)
Troponin I: 0.16 ng/mL — ABNORMAL HIGH (ref <0.031)

## 2015-12-28 LAB — CBG MONITORING, ED: Glucose-Capillary: 82 mg/dL (ref 65–99)

## 2015-12-28 MED ORDER — OXYCODONE HCL 5 MG PO TABS
5.0000 mg | ORAL_TABLET | ORAL | Status: DC | PRN
  Administered 2015-12-28 – 2015-12-31 (×6): 5 mg via ORAL
  Filled 2015-12-28 (×7): qty 1

## 2015-12-28 MED ORDER — ACETAMINOPHEN 325 MG PO TABS
650.0000 mg | ORAL_TABLET | Freq: Four times a day (QID) | ORAL | Status: DC | PRN
  Administered 2015-12-29 – 2015-12-30 (×5): 650 mg via ORAL
  Filled 2015-12-28 (×5): qty 2

## 2015-12-28 MED ORDER — LEVOFLOXACIN IN D5W 750 MG/150ML IV SOLN
750.0000 mg | INTRAVENOUS | Status: DC
  Administered 2015-12-29 – 2016-01-01 (×4): 750 mg via INTRAVENOUS
  Filled 2015-12-28 (×4): qty 150

## 2015-12-28 MED ORDER — ACETAMINOPHEN 650 MG RE SUPP
650.0000 mg | Freq: Once | RECTAL | Status: AC
  Administered 2015-12-28: 650 mg via RECTAL
  Filled 2015-12-28: qty 1

## 2015-12-28 MED ORDER — POTASSIUM CHLORIDE CRYS ER 20 MEQ PO TBCR
40.0000 meq | EXTENDED_RELEASE_TABLET | Freq: Once | ORAL | Status: AC
  Administered 2015-12-28: 40 meq via ORAL
  Filled 2015-12-28: qty 2

## 2015-12-28 MED ORDER — MORPHINE SULFATE (PF) 2 MG/ML IV SOLN
1.0000 mg | INTRAVENOUS | Status: DC | PRN
  Administered 2015-12-28 – 2015-12-31 (×4): 1 mg via INTRAVENOUS
  Filled 2015-12-28 (×4): qty 1

## 2015-12-28 MED ORDER — DEXTROSE 5 % IV SOLN
2.0000 g | Freq: Three times a day (TID) | INTRAVENOUS | Status: DC
  Administered 2015-12-28 – 2015-12-30 (×6): 2 g via INTRAVENOUS
  Filled 2015-12-28 (×7): qty 2

## 2015-12-28 MED ORDER — VANCOMYCIN HCL IN DEXTROSE 1-5 GM/200ML-% IV SOLN
1000.0000 mg | Freq: Once | INTRAVENOUS | Status: AC
  Administered 2015-12-28: 1000 mg via INTRAVENOUS
  Filled 2015-12-28: qty 200

## 2015-12-28 MED ORDER — DOCUSATE SODIUM 100 MG PO CAPS
100.0000 mg | ORAL_CAPSULE | Freq: Two times a day (BID) | ORAL | Status: DC
  Administered 2015-12-28 – 2015-12-29 (×2): 100 mg via ORAL
  Filled 2015-12-28 (×12): qty 1

## 2015-12-28 MED ORDER — LEVOFLOXACIN IN D5W 750 MG/150ML IV SOLN
750.0000 mg | Freq: Once | INTRAVENOUS | Status: AC
  Administered 2015-12-28: 750 mg via INTRAVENOUS
  Filled 2015-12-28: qty 150

## 2015-12-28 MED ORDER — LEVALBUTEROL HCL 0.63 MG/3ML IN NEBU
0.6300 mg | INHALATION_SOLUTION | Freq: Four times a day (QID) | RESPIRATORY_TRACT | Status: DC | PRN
  Administered 2015-12-30 – 2016-01-02 (×3): 0.63 mg via RESPIRATORY_TRACT
  Filled 2015-12-28 (×3): qty 3

## 2015-12-28 MED ORDER — VANCOMYCIN HCL IN DEXTROSE 750-5 MG/150ML-% IV SOLN
750.0000 mg | Freq: Two times a day (BID) | INTRAVENOUS | Status: DC
  Administered 2015-12-29 – 2015-12-30 (×4): 750 mg via INTRAVENOUS
  Filled 2015-12-28 (×4): qty 150

## 2015-12-28 MED ORDER — ONDANSETRON HCL 4 MG PO TABS: 4.0000 mg | ORAL_TABLET | Freq: Four times a day (QID) | ORAL | Status: DC | PRN

## 2015-12-28 MED ORDER — ENOXAPARIN SODIUM 40 MG/0.4ML ~~LOC~~ SOLN
40.0000 mg | SUBCUTANEOUS | Status: DC
  Administered 2015-12-28 – 2016-01-01 (×5): 40 mg via SUBCUTANEOUS
  Filled 2015-12-28 (×6): qty 0.4

## 2015-12-28 MED ORDER — ACETAMINOPHEN 325 MG PO TABS
650.0000 mg | ORAL_TABLET | Freq: Once | ORAL | Status: AC
  Administered 2015-12-28: 650 mg via ORAL
  Filled 2015-12-28: qty 2

## 2015-12-28 MED ORDER — SODIUM CHLORIDE 0.9 % IV SOLN
INTRAVENOUS | Status: DC
  Administered 2015-12-28 – 2015-12-29 (×3): via INTRAVENOUS

## 2015-12-28 MED ORDER — ACETAMINOPHEN 650 MG RE SUPP
650.0000 mg | Freq: Four times a day (QID) | RECTAL | Status: DC | PRN
Start: 2015-12-28 — End: 2015-12-30

## 2015-12-28 MED ORDER — ONDANSETRON HCL 4 MG/2ML IJ SOLN
4.0000 mg | Freq: Four times a day (QID) | INTRAMUSCULAR | Status: DC | PRN
  Administered 2015-12-28: 4 mg via INTRAVENOUS
  Filled 2015-12-28: qty 2

## 2015-12-28 MED ORDER — METHYLPREDNISOLONE SODIUM SUCC 40 MG IJ SOLR
40.0000 mg | Freq: Two times a day (BID) | INTRAMUSCULAR | Status: DC
  Administered 2015-12-28 – 2015-12-30 (×4): 40 mg via INTRAVENOUS
  Filled 2015-12-28 (×6): qty 1

## 2015-12-28 MED ORDER — OSELTAMIVIR PHOSPHATE 75 MG PO CAPS
75.0000 mg | ORAL_CAPSULE | Freq: Two times a day (BID) | ORAL | Status: DC
Start: 2015-12-28 — End: 2015-12-29
  Administered 2015-12-28: 75 mg via ORAL
  Filled 2015-12-28 (×3): qty 1

## 2015-12-28 MED ORDER — DEXTROSE 5 % IV SOLN
2.0000 g | Freq: Once | INTRAVENOUS | Status: AC
  Administered 2015-12-28: 2 g via INTRAVENOUS
  Filled 2015-12-28: qty 2

## 2015-12-28 MED ORDER — SODIUM CHLORIDE 0.9 % IJ SOLN
3.0000 mL | Freq: Two times a day (BID) | INTRAMUSCULAR | Status: DC
  Administered 2015-12-28 – 2016-01-02 (×8): 3 mL via INTRAVENOUS

## 2015-12-28 NOTE — ED Notes (Signed)
Bed: WA15 Expected date:  Expected time:  Means of arrival:  Comments: RES B 

## 2015-12-28 NOTE — ED Notes (Signed)
MD at bedside. 

## 2015-12-28 NOTE — ED Notes (Signed)
Cough, nausea, and fever since Sunday. Son has noticed increased confusion, states she's become close to unresponsive at this time. Pt not wanting to talk very much, states she hasn't been eating and drinking as much. Denies vomiting or diarrhea since this started, cough sounds wet and has been productive, Lung sounds diminished bilaterally, rhonchi at bases.

## 2015-12-28 NOTE — ED Notes (Signed)
Code sepsis called but order not put in yet.

## 2015-12-28 NOTE — Progress Notes (Signed)
ANTIBIOTIC CONSULT NOTE - INITIAL  Pharmacy Consult for Vancomycin, Levaquin, & Aztreonam Indication: sepsis  Allergies  Allergen Reactions  . Adhesive [Tape] Rash  . Celebrex [Celecoxib] Hives  . Cymbalta [Duloxetine Hcl] Swelling  . Gabitril [Tiagabine] Swelling  . Keflex [Cephalexin] Nausea And Vomiting  . Lyrica [Pregabalin] Swelling  . Neurontin [Gabapentin] Swelling  . Nexium [Esomeprazole] Rash  . Nsaids Rash  . Penicillins Rash    Injection site reaction  . Shrimp [Shellfish Allergy] Anaphylaxis  . Sulfa Antibiotics Other (See Comments)    "deathly ill"/nausea    Patient Measurements: Height: 5\' 5"  (165.1 cm) Weight: 208 lb (94.348 kg) IBW/kg (Calculated) : 57  Vital Signs: Temp: 103.3 F (39.6 C) (01/17 1218) Temp Source: Rectal (01/17 1218) BP: 189/112 mmHg (01/17 1218) Pulse Rate: 117 (01/17 1218) Intake/Output from previous day:   Intake/Output from this shift:    Labs:  Recent Labs  12/28/15 1227  WBC 8.4  HGB 13.7  PLT 285  CREATININE 0.86   Estimated Creatinine Clearance: 58.2 mL/min (by C-G formula based on Cr of 0.86). No results for input(s): VANCOTROUGH, VANCOPEAK, VANCORANDOM, GENTTROUGH, GENTPEAK, GENTRANDOM, TOBRATROUGH, TOBRAPEAK, TOBRARND, AMIKACINPEAK, AMIKACINTROU, AMIKACIN in the last 72 hours.   Microbiology: No results found for this or any previous visit (from the past 720 hour(s)).  Medical History: Past Medical History  Diagnosis Date  . Cataract   . Osteoporosis   . Arthritis   . Adrenal failure ([place])   . Fibromyalgia 2008    Medications:  Anti-infectives    Start     Dose/Rate Route Frequency Ordered Stop   12/28/15 1330  levofloxacin (LEVAQUIN) IVPB 750 mg     750 mg 100 mL/hr over 90 Minutes Intravenous  Once 12/28/15 1320     12/28/15 1330  aztreonam (AZACTAM) 2 g in dextrose 5 % 50 mL IVPB     2 g 100 mL/hr over 30 Minutes Intravenous  Once 12/28/15 1320     12/28/15 1330  vancomycin (VANCOCIN) IVPB 1000  mg/200 mL premix     1,000 mg 200 mL/hr over 60 Minutes Intravenous  Once 12/28/15 1320       Assessment: 82 yo F admitted with AMS, cough, nausea, and fever.  Code Sepsis initiated.  PMH significant for RA and fibromyalgia for which she is on Enbrel and MTX.    Tm 103.3 F  WBC and LA wnl  Scr 0.86; CrCl ~ 58 (CG/N)   CXR = no acute dz; UA pending  Antimicrobials this admission: 1/17 Vanc >>  1/17 Aztreonam >>  1/17 Levaquin>>  Levels/dose changes this admission:  Microbiology Results: 1/17 BCx:  1/17 UCx:    Goal of Therapy:  Vancomycin trough level 15-20 mcg/ml  Plan:  Aztreonam 2gm IV q8h Levaquin 750mg  IV q24h Vancomycin 1gm IV x1 now then 750mg  IV q12h Check Vancomycin trough at steady state Monitor renal function and cx data   Biagio Borg 12/28/2015,1:27 PM

## 2015-12-28 NOTE — Progress Notes (Signed)
Utilization Review completed.  Emiliya Chretien RN CM  

## 2015-12-28 NOTE — H&P (Signed)
[facility] History and Physical  Eileen Wilkerson ZOX:[id number] DOB: 1932/09/16 DOA: 12/28/2015   PCP: Thressa Sheller, MD  Specialists: Patient is followed by Dr. Estanislado Pandy for her rheumatoid arthritis. She is followed by Dr. Chalmers Cater with endocrinology.  Chief Complaint: Fever and cough   HPI: Eileen Wilkerson is a 82 y.o. female with a past medical history of rheumatoid arthritis on chronic steroids who was in her usual state of health until this past Sunday when she started developing a cough. She was in her doctor's office last week and she said that the waiting room was full of people wearing masks. She denies any other sick contacts. Today, she was lethargic and slumped over her chair. No history of syncopal episode. She had fever. She was brought into the hospital. She's had a few episodes of nausea and vomiting. Dry heaves since she has been in the hospital. She also is complaining of central chest pain which has been present since Sunday. According to the daughter this has been on and off. No diarrhea. She is up-to-date on her immunizations. History is somewhat limited as she is mildly confused.  Home Medications: This list has not been verified yet. Await pharmacy tech to reconcile. Prior to Admission medications   Medication Sig Start Date End Date Taking? Authorizing Provider  Tofacitinib Citrate (XELJANZ) 5 MG TABS Take 5 mg by mouth.   Yes Historical Provider, MD  acetaminophen (TYLENOL) 325 MG tablet Take 650 mg by mouth 2 (two) times daily.    Historical Provider, MD  atorvastatin (LIPITOR) 20 MG tablet Take 20 mg by mouth At bedtime. 09/12/12   Historical Provider, MD  BIOTIN 5000 PO Take 1 tablet by mouth 2 (two) times daily.    Historical Provider, MD  calcium carbonate (OS-CAL) 600 MG TABS Take 600 mg by mouth 2 (two) times daily with a meal.    Historical Provider, MD  co-enzyme Q-10 30 MG capsule Take 100 mg by mouth daily. Take 2 tablets daily    Historical  Provider, MD  ENBREL SURECLICK 50 MG/ML injection Inject 50 mg into the muscle Once a week. 10/22/12   Historical Provider, MD  folic acid (FOLVITE) 1 MG tablet Take 1 mg by mouth Daily. 11/03/12   Historical Provider, MD  furosemide (LASIX) 40 MG tablet Take 60 mg by mouth Daily. 09/10/12   Historical Provider, MD  methocarbamol (ROBAXIN) 500 MG tablet Take 500 mg by mouth 3 (three) times daily.  10/28/12   Historical Provider, MD  methotrexate 25 MG/ML injection Inject 0.8 mLs into the skin Once a week. 10/29/12   Historical Provider, MD  predniSONE (DELTASONE) 5 MG tablet Take 10 mg by mouth Daily. 10/29/12   Historical Provider, MD  PREVACID 24HR 15 MG capsule Take 15 mg by mouth Daily. 10/07/12   Historical Provider, MD  SAVELLA 50 MG TABS Take 50 mg by mouth 2 (two) times daily.  10/18/12   Historical Provider, MD  traMADol (ULTRAM) 50 MG tablet Take 50 mg by mouth Twice daily. 10/31/12   Historical Provider, MD  Zoledronic Acid (RECLAST IV) Inject into the vein. Once a year    Historical Provider, MD    Allergies:  Allergies  Allergen Reactions  . Adhesive [Tape] Rash  . Celebrex [Celecoxib] Hives  . Cymbalta [Duloxetine Hcl] Swelling  . Gabitril [Tiagabine] Swelling  . Keflex [Cephalexin] Nausea And Vomiting  . Lyrica [Pregabalin] Swelling  . Neurontin [Gabapentin] Swelling  . Nexium [Esomeprazole] Rash  . Nsaids Rash  .  Penicillins Rash    Injection site reaction Has patient had a PCN reaction causing immediate rash, facial/tongue/throat swelling, SOB or lightheadedness with hypotension: No Has patient had a PCN reaction causing severe rash involving mucus membranes or skin necrosis: No Has patient had a PCN reaction that required hospitalization No Has patient had a PCN reaction occurring within the last 10 years: No If all of the above answers are "NO", then may proceed with Cephalosporin use.   . Shrimp [Shellfish Allergy] Anaphylaxis  . Sulfa Antibiotics Other (See  Comments)    "deathly ill"/nausea    Past Medical History: Past Medical History  Diagnosis Date  . Cataract   . Osteoporosis   . Arthritis   . Adrenal failure ([place])   . Fibromyalgia 2008    Past Surgical History  Procedure Laterality Date  . Cervical fusion  2011,2010,2008    c 3--4,4-5, lower back  . Carpel tunnel release  2005    bilateral wrists  . Heel spur surgery  2004  . Cataract extraction, bilateral  2004  . Rotater cuff  [id number]  . Tonsillectomy and adenoidectomy  1947  . Back surgery      Social History: Lives in [place] with her husband. She is the primary caregiver for her husband. No history of smoking or alcohol use currently. Usually independent with daily activities.  Family History:  Family History  Problem Relation Age of Onset  . Breast cancer Mother   . Diabetes Father      Review of Systems - unable to do due to mental confusion and acute illness  Physical Examination  Filed Vitals:   12/28/15 1144 12/28/15 1218 12/28/15 1221 12/28/15 1411  BP: 178/101 189/112  140/69  Pulse: 119 117  113  Temp: 99.7 F (37.6 C) 103.3 F (39.6 C)    TempSrc: Oral Rectal    Resp:  25  22  Height:   _0  (1.651 m)   Weight:   94.348 kg (208 lb)   SpO2: 92% 92%  100%    BP 140/69 mmHg  Pulse 113  Temp(Src) 103.3 F (39.6 C) (Rectal)  Resp 22  Ht _1  (1.651 m)  Wt 94.348 kg (208 lb)  BMI 34.61 kg/m2  SpO2 100%  General appearance: alert, cooperative, distracted and no distress Head: Normocephalic, without obvious abnormality, atraumatic Eyes: conjunctivae/corneas clear. PERRL, EOM's intact.  Throat: Extremely dry mucous membranes. No oral lesions are identified. Neck: no adenopathy, no carotid bruit, no JVD, supple, symmetrical, trachea midline and thyroid not enlarged, symmetric, no tenderness/mass/nodules Resp: Ors breath sounds bilaterally. Few crackles heard at the right base. No wheezing. No rhonchi. Cardio: regular rate and rhythm,  S1, S2 normal, no murmur, click, rub or gallop GI: soft, non-tender; bowel sounds normal; no masses,  no organomegaly Extremities: extremities normal, atraumatic, no cyanosis or edema Pulses: 2+ and symmetric Skin: Skin color, texture, turgor normal. No rashes or lesions Lymph nodes: Cervical, supraclavicular, and axillary nodes normal. Neurologic: Awake, alert. Slightly distracted. Oriented to person. Confused about where she was. No facial asymmetry. Tongue is midline. Moving all her extremities. No focal deficits appreciated.  Laboratory Data: Results for orders placed or performed during the hospital encounter of 12/28/15 (from the past 48 hour(s))  CBG monitoring, ED     Status: None   Collection Time: 12/28/15 11:57 AM  Result Value Ref Range   Glucose-Capillary 82 65 - 99 mg/dL  Comprehensive metabolic panel     Status: Abnormal   Collection  Time: 12/28/15 12:27 PM  Result Value Ref Range   Sodium 140 135 - 145 mmol/L   Potassium 3.1 (L) 3.5 - 5.1 mmol/L   Chloride 100 (L) 101 - 111 mmol/L   CO2 29 22 - 32 mmol/L   Glucose, Bld 132 (H) 65 - 99 mg/dL   BUN 17 6 - 20 mg/dL   Creatinine, Ser 0.86 0.44 - 1.00 mg/dL   Calcium 9.3 8.9 - 10.3 mg/dL   Total Protein 8.4 (H) 6.5 - 8.1 g/dL   Albumin 4.4 3.5 - 5.0 g/dL   AST 36 15 - 41 U/L   ALT 28 14 - 54 U/L   Alkaline Phosphatase 64 38 - 126 U/L   Total Bilirubin 1.1 0.3 - 1.2 mg/dL   GFR calc non Af Amer >60 >60 mL/min   GFR calc Af Amer >60 >60 mL/min    Comment: (NOTE) The eGFR has been calculated using the CKD EPI equation. This calculation has not been validated in all clinical situations. eGFR's persistently <60 mL/min signify possible Chronic Kidney Disease.    Anion gap 11 5 - 15  CBC with Differential     Status: Abnormal   Collection Time: 12/28/15 12:27 PM  Result Value Ref Range   WBC 8.4 4.0 - 10.5 K/uL   RBC 4.10 3.87 - 5.11 MIL/uL   Hemoglobin 13.7 12.0 - 15.0 g/dL   HCT 43.3 36.0 - 46.0 %   MCV 105.6 (H)  78.0 - 100.0 fL   MCH 33.4 26.0 - 34.0 pg   MCHC 31.6 30.0 - 36.0 g/dL   RDW 15.1 11.5 - 15.5 %   Platelets 285 150 - 400 K/uL   Neutrophils Relative % 75 %   Neutro Abs 6.3 1.7 - 7.7 K/uL   Lymphocytes Relative 17 %   Lymphs Abs 1.5 0.7 - 4.0 K/uL   Monocytes Relative 7 %   Monocytes Absolute 0.6 0.1 - 1.0 K/uL   Eosinophils Relative 1 %   Eosinophils Absolute 0.0 0.0 - 0.7 K/uL   Basophils Relative 0 %   Basophils Absolute 0.0 0.0 - 0.1 K/uL  I-Stat CG4 Lactic Acid, ED (Not at [facility])     Status: None   Collection Time: 12/28/15 12:29 PM  Result Value Ref Range   Lactic Acid, Venous 1.92 0.5 - 2.0 mmol/L  Urinalysis, Routine w reflex microscopic (not at [facility])     Status: Abnormal   Collection Time: 12/28/15 12:55 PM  Result Value Ref Range   Color, Urine YELLOW YELLOW   APPearance CLEAR CLEAR   Specific Gravity, Urine 1.012 1.005 - 1.030   pH 7.5 5.0 - 8.0   Glucose, UA NEGATIVE NEGATIVE mg/dL   Hgb urine dipstick MODERATE (A) NEGATIVE   Bilirubin Urine NEGATIVE NEGATIVE   Ketones, ur NEGATIVE NEGATIVE mg/dL   Protein, ur NEGATIVE NEGATIVE mg/dL   Nitrite NEGATIVE NEGATIVE   Leukocytes, UA NEGATIVE NEGATIVE  Urine microscopic-add on     Status: Abnormal   Collection Time: 12/28/15 12:55 PM  Result Value Ref Range   Squamous Epithelial / LPF 0-5 (A) NONE SEEN   WBC, UA 0-5 0 - 5 WBC/hpf   RBC / HPF 6-30 0 - 5 RBC/hpf   Bacteria, UA RARE (A) NONE SEEN  Troponin I     Status: Abnormal   Collection Time: 12/28/15  3:17 PM  Result Value Ref Range   Troponin I 0.10 (H) <0.031 ng/mL    Comment:  PERSISTENTLY INCREASED TROPONIN VALUES IN THE RANGE OF 0.04-0.49 ng/mL CAN BE SEEN IN:       -UNSTABLE ANGINA       -CONGESTIVE HEART FAILURE       -MYOCARDITIS       -CHEST TRAUMA       -ARRYHTHMIAS       -LATE PRESENTING MYOCARDIAL INFARCTION       -COPD   CLINICAL FOLLOW-UP RECOMMENDED.   I-Stat CG4 Lactic Acid, ED (Not at [facility])     Status: None   Collection Time:  12/28/15  3:27 PM  Result Value Ref Range   Lactic Acid, Venous 1.92 0.5 - 2.0 mmol/L    Radiology Reports: Dg Chest Port 1 View  12/28/2015  CLINICAL DATA:  Cough EXAM: PORTABLE CHEST 1 VIEW COMPARISON:  12/14/2015 FINDINGS: Lung volumes are low leading to bronchovascular crowding at the bases. Allowing for this, the lungs are clear. No pleural effusion or pneumothorax. Cardiac silhouette is normal in size. Normal mediastinal and hilar contours. Stable changes from previous anterior cervical spine fusion surgery. IMPRESSION: 1. No acute cardiopulmonary disease. Electronically Signed   By: Lajean Manes M.D.   On: 12/28/2015 13:09    My interpretation of Electrocardiogram: Sinus tachycardia at 106 bpm. Left axis deviation. PVC is noted. No concerning ST or T-wave changes.  Problem List  Principal Problem:   Fever Active Problems:   Cough   Dehydration   Rheumatoid arthritis ([place])   Long term current use of systemic steroids   Assessment: This is a 82 year old Caucasian female with past medical history of rheumatoid arthritis on chronic steroids who presents with fever and cough. Chest x-ray does not show any pneumonia. However, she is quite dehydrated. Chest pain is most likely due to her acute illness. EKG does not show any ischemic changes.  Plan: #1 Fever and cough/presumed pneumonia: She is mildly confused. However, there are no meningeal signs. Her mental status has significantly improved per her family since her fever has improved. She will be hydrated and chest x-ray will be repeated tomorrow morning. Influenza PCR is pending. In the meantime, continue with broad-spectrum antibiotics as ordered by the ED staff, including vancomycin, Levaquin and aztreonam. Will also give her Tamiflu till influenza PCR results are available. Blood cultures have been obtained. Swallow evaluation. Lactic acid level is normal.  #2 chest pain with mildly elevated troponin: EKG does not show any ischemic  changes. Pain is most likely due to her cough. Elevated troponin could be reflective of demand ischemia. Troponin will be trended. EKG will be repeated in the morning. Consider echocardiogram if there is a significant rise in the troponin.  #3 history of rheumatoid arthritis on chronic steroids: Hold her methotrexate. Give her a higher dose of steroid for now to help with acute stress especially since her records suggest a history of adrenal insufficiency in the past. Should be tapered down if remains stable.  #4 Dehydration: IV fluids will be ordered. Follow clinically. Potassium will be repleted.   DVT Prophylaxis: Lovenox Code Status: Discussed with the patient and family. She is full code Family Communication: Discussed with patient and her daughter and son  Disposition Plan: Admit to telemetry   Further management decisions will depend on results of further testing and patient's response to treatment.   [facility]  [facility] Pager [phone]  If 7PM-7AM, please contact night-coverage [email or web address] Password [facility]  12/28/2015, 4:41 PM

## 2015-12-28 NOTE — ED Notes (Signed)
NURSE IS IN THE ROOM COLLECT LABS

## 2015-12-28 NOTE — ED Provider Notes (Signed)
CSN: [id number]     Arrival date & time 12/28/15  1124 History   First MD Initiated Contact with Patient 12/28/15 1221     Chief Complaint  Patient presents with  . Altered Mental Status  . Cough     (Consider location/radiation/quality/duration/timing/severity/associated sxs/prior Treatment) HPI Comments: Patient here with cough and fever and nausea since Sunday. She is also has some increased confusion the past 24 hours. When the son started this morning, she was not her usual self. No reported vomiting. No urinary symptoms. She's not eating or drinking very well. No recent sick exposures. Was using over-the-counter medications without relief. Son drove the patient here.  Patient is a 82 y.o. female presenting with altered mental status and cough. The history is provided by the patient and a relative.  Altered Mental Status Presenting symptoms: lethargy   Severity:  Moderate Most recent episode:  Yesterday Episode history:  Single Timing:  Constant Progression:  Worsening Chronicity:  New Cough   Past Medical History  Diagnosis Date  . Cataract   . Osteoporosis   . Arthritis   . Adrenal failure ([place])   . Fibromyalgia 2008   Past Surgical History  Procedure Laterality Date  . Cervical fusion  2011,2010,2008    c 3--4,4-5, lower back  . Carpel tunnel release  2005    bilateral wrists  . Heel spur surgery  2004  . Cataract extraction, bilateral  2004  . Rotater cuff  [id number]  . Tonsillectomy and adenoidectomy  1947  . Back surgery     History reviewed. No pertinent family history. Social History  Substance Use Topics  . Smoking status: Former Research scientist (life sciences)  . Smokeless tobacco: None  . Alcohol Use: None   OB History    No data available     Review of Systems  Respiratory: Positive for cough.   All other systems reviewed and are negative.     Allergies  Adhesive; Celebrex; Cymbalta; Gabitril; Keflex; Lyrica; Neurontin; Nexium; Nsaids; Penicillins; Shrimp; and  Sulfa antibiotics  Home Medications   Prior to Admission medications   Medication Sig Start Date End Date Taking? Authorizing Provider  Tofacitinib Citrate (XELJANZ) 5 MG TABS Take 5 mg by mouth.   Yes Historical Provider, MD  acetaminophen (TYLENOL) 325 MG tablet Take 650 mg by mouth 2 (two) times daily.    Historical Provider, MD  atorvastatin (LIPITOR) 20 MG tablet Take 20 mg by mouth At bedtime. 09/12/12   Historical Provider, MD  BIOTIN 5000 PO Take 1 tablet by mouth 2 (two) times daily.    Historical Provider, MD  calcium carbonate (OS-CAL) 600 MG TABS Take 600 mg by mouth 2 (two) times daily with a meal.    Historical Provider, MD  co-enzyme Q-10 30 MG capsule Take 100 mg by mouth daily. Take 2 tablets daily    Historical Provider, MD  ENBREL SURECLICK 50 MG/ML injection Inject 50 mg into the muscle Once a week. 10/22/12   Historical Provider, MD  folic acid (FOLVITE) 1 MG tablet Take 1 mg by mouth Daily. 11/03/12   Historical Provider, MD  furosemide (LASIX) 40 MG tablet Take 60 mg by mouth Daily. 09/10/12   Historical Provider, MD  methocarbamol (ROBAXIN) 500 MG tablet Take 500 mg by mouth 3 (three) times daily.  10/28/12   Historical Provider, MD  methotrexate 25 MG/ML injection Inject 0.8 mLs into the skin Once a week. 10/29/12   Historical Provider, MD  predniSONE (DELTASONE) 5 MG tablet Take 10 mg  by mouth Daily. 10/29/12   Historical Provider, MD  PREVACID 24HR 15 MG capsule Take 15 mg by mouth Daily. 10/07/12   Historical Provider, MD  SAVELLA 50 MG TABS Take 50 mg by mouth 2 (two) times daily.  10/18/12   Historical Provider, MD  traMADol (ULTRAM) 50 MG tablet Take 50 mg by mouth Twice daily. 10/31/12   Historical Provider, MD  Zoledronic Acid (RECLAST IV) Inject into the vein. Once a year    Historical Provider, MD   BP 189/112 mmHg  Pulse 117  Temp(Src) 103.3 F (39.6 C) (Rectal)  Resp 25  Ht 5\' 5"  (1.651 m)  Wt 94.348 kg  BMI 34.61 kg/m2  SpO2 92% Physical Exam   Constitutional: She appears well-developed and well-nourished. She appears lethargic.  Non-toxic appearance. No distress.  HENT:  Head: Normocephalic and atraumatic.  Eyes: Conjunctivae, EOM and lids are normal. Pupils are equal, round, and reactive to light.  Neck: Normal range of motion. Neck supple. No tracheal deviation present. No thyroid mass present.  Cardiovascular: Regular rhythm and normal heart sounds.  Tachycardia present.  Exam reveals no gallop.   No murmur heard. Pulmonary/Chest: No stridor. She is in respiratory distress. She has decreased breath sounds. She has wheezes. She has no rhonchi. She has no rales.  Abdominal: Soft. Normal appearance and bowel sounds are normal. She exhibits no distension. There is no tenderness. There is no rebound and no CVA tenderness.  Musculoskeletal: Normal range of motion. She exhibits no edema or tenderness.  Neurological: She appears lethargic. No cranial nerve deficit or sensory deficit. GCS eye subscore is 4. GCS verbal subscore is 4. GCS motor subscore is 5.  Skin: Skin is warm and dry. No abrasion and no rash noted.  Nursing note and vitals reviewed.   ED Course  Procedures (including critical care time) Labs Review Labs Reviewed  CULTURE, BLOOD (ROUTINE X 2)  CULTURE, BLOOD (ROUTINE X 2)  URINE CULTURE  COMPREHENSIVE METABOLIC PANEL  CBC WITH DIFFERENTIAL/PLATELET  URINALYSIS, ROUTINE W REFLEX MICROSCOPIC (NOT AT [facility])  I-STAT CG4 LACTIC ACID, ED  CBG MONITORING, ED  CBG MONITORING, ED    Imaging Review No results found. I have personally reviewed and evaluated these images and lab results as part of my medical decision-making.   EKG Interpretation None      MDM   Final diagnoses:  None    Patient clinically with evidence of pneumonia although chest x-ray without infiltrate. Given Tylenol and her mental status is greatly improved. She has no nuchal rigidity. Patient questions a properly. Empirically treated with  antibiotics. Was just informed that patient had been taking ciprofloxacin for UTI although her urinalysis here shows no white cells. Patient to be admitted to telemetry  Lacretia Leigh, MD 12/28/15 1445

## 2015-12-29 ENCOUNTER — Encounter: Payer: Medicare Other | Admitting: Physician Assistant

## 2015-12-29 ENCOUNTER — Inpatient Hospital Stay (HOSPITAL_COMMUNITY): Payer: Medicare Other

## 2015-12-29 DIAGNOSIS — E860 Dehydration: Secondary | ICD-10-CM

## 2015-12-29 DIAGNOSIS — M069 Rheumatoid arthritis, unspecified: Secondary | ICD-10-CM

## 2015-12-29 DIAGNOSIS — R509 Fever, unspecified: Secondary | ICD-10-CM

## 2015-12-29 LAB — COMPREHENSIVE METABOLIC PANEL
ALT: 29 U/L (ref 14–54)
AST: 45 U/L — ABNORMAL HIGH (ref 15–41)
Albumin: 3.8 g/dL (ref 3.5–5.0)
Alkaline Phosphatase: 54 U/L (ref 38–126)
Anion gap: 12 (ref 5–15)
BUN: 9 mg/dL (ref 6–20)
CO2: 26 mmol/L (ref 22–32)
Calcium: 8.8 mg/dL — ABNORMAL LOW (ref 8.9–10.3)
Chloride: 100 mmol/L — ABNORMAL LOW (ref 101–111)
Creatinine, Ser: 0.68 mg/dL (ref 0.44–1.00)
GFR calc Af Amer: >60 mL/min (ref >60)
GFR calc non Af Amer: >60 mL/min (ref >60)
Glucose, Bld: 154 mg/dL — ABNORMAL HIGH (ref 65–99)
Potassium: 3.7 mmol/L (ref 3.5–5.1)
Sodium: 138 mmol/L (ref 135–145)
Total Bilirubin: 0.4 mg/dL (ref 0.3–1.2)
Total Protein: 7.6 g/dL (ref 6.5–8.1)

## 2015-12-29 LAB — CBC
HCT: 44.3 % (ref 36.0–46.0)
Hemoglobin: 14.2 g/dL (ref 12.0–15.0)
MCH: 33.5 pg (ref 26.0–34.0)
MCHC: 32.1 g/dL (ref 30.0–36.0)
MCV: 104.5 fL — ABNORMAL HIGH (ref 78.0–100.0)
Platelets: 228 10*3/uL (ref 150–400)
RBC: 4.24 MIL/uL (ref 3.87–5.11)
RDW: 15.2 % (ref 11.5–15.5)
WBC: 10 10*3/uL (ref 4.0–10.5)

## 2015-12-29 LAB — INFLUENZA PANEL BY PCR (TYPE A & B)
H1N1 flu by pcr: NOT DETECTED
Influenza A By PCR: NEGATIVE
Influenza B By PCR: NEGATIVE

## 2015-12-29 LAB — TROPONIN I: Troponin I: 0.13 ng/mL — ABNORMAL HIGH (ref <0.031)

## 2015-12-29 NOTE — Evaluation (Signed)
Clinical/Bedside Swallow Evaluation Patient Details  Name: Eileen Wilkerson MRN: [id number] Date of Birth: 02/02/1932  Today's Date: 12/29/2015 Time: SLP Start Time (ACUTE ONLY): [id number] SLP Stop Time (ACUTE ONLY): 1324 SLP Time Calculation (min) (ACUTE ONLY): 19 min  Past Medical History:  Past Medical History  Diagnosis Date  . Cataract   . Osteoporosis   . Arthritis   . Adrenal failure ([place])   . Fibromyalgia 2008   Past Surgical History:  Past Surgical History  Procedure Laterality Date  . Cervical fusion  2011,2010,2008    c 3--4,4-5, lower back  . Carpel tunnel release  2005    bilateral wrists  . Heel spur surgery  2004  . Cataract extraction, bilateral  2004  . Rotater cuff  [id number]  . Tonsillectomy and adenoidectomy  1947  . Back surgery     HPI:  82 yo female adm to [facility] with cough over the last few days.  PMH + for cervical fusion in 2008, 2010, 2011.  She admits she has a hiatal hernia and some reflux but denies dysphagia. Pt admits to difficulty swallowing pills due to feeling that stuck in the throat today - attributing to secretions.     Assessment / Plan / Recommendation Clinical Impression  Pt presents wtih functional oropharyngeal swallow abiltiy based on clinical swallow evaluation.  CN exam was unremarkable and pt with strong productive cuogh.  Pt admits to h/o GERD and hiatal hernia but states she takes medications for reflux.  She also admits they eat spicy foods.    Recommend pt have a regular/thin diet with general aspiration and reflux precautions.  SLP to sign off.      Aspiration Risk  Mild aspiration risk    Diet Recommendation Regular;Thin liquid   Liquid Administration via: Cup;Straw Medication Administration: Whole meds with liquid Supervision: Patient able to self feed Compensations: Slow rate;Small sips/bites Postural Changes: Seated upright at 90 degrees;Remain upright for at least 30 minutes after po intake    Other  Recommendations  Oral Care Recommendations: Oral care BID   Follow up Recommendations    n/a   Frequency and Duration   n/a         Prognosis   n/a     Swallow Study   General Date of Onset: 12/29/15 HPI: 82 yo female adm to [facility] with cough over the last few days.  PMH + for cervical fusion in 2008, 2010, 2011.  She admits she has a hiatal hernia and some reflux but denies dysphagia. Pt admits to difficulty swallowing pills due to feeling that stuck in the throat today - attributing to secretions.   Type of Study: Bedside Swallow Evaluation Diet Prior to this Study: Dysphagia 3 (soft);Thin liquids Temperature Spikes Noted: No Respiratory Status: Room air History of Recent Intubation: No Behavior/Cognition: Alert;Cooperative;Pleasant mood Oral Cavity Assessment: Within Functional Limits Oral Care Completed by SLP: No Oral Cavity - Dentition: Adequate natural dentition Vision: Functional for self-feeding Self-Feeding Abilities: Able to feed self Patient Positioning: Upright in bed Baseline Vocal Quality: Normal Volitional Cough: Strong Volitional Swallow: Able to elicit    Oral/Motor/Sensory Function Overall Oral Motor/Sensory Function: Within functional limits   Ice Chips Ice chips: Not tested   Thin Liquid Thin Liquid: Within functional limits Presentation: Straw;Cup;Self Fed    Nectar Thick Nectar Thick Liquid: Not tested   Honey Thick Honey Thick Liquid: Not tested   Puree Puree: Within functional limits Presentation: Self Fed;Spoon   Solid   GO  Solid: Within functional limits Presentation: [place], [place], [place] [facility] SLP [phone]

## 2015-12-29 NOTE — Progress Notes (Addendum)
PT Cancellation Note  Patient Details Name: Eileen Wilkerson MRN: [id number] DOB: [birth year]   Cancelled Treatment:    Reason Eval/Treat Not Completed: Fatigue/lethargy limiting ability to participate (declines PT this morning, would like PT to check back this afternoon)  Addition: PT checked back on pt this afternoon however she was sleeping and arousable however quickly fell back asleep.  Eugean Arnott,KATHrine E 12/29/2015, 10:25 AM Carmelia Bake, PT, DPT 12/29/2015 Pager: [phone]

## 2015-12-29 NOTE — Evaluation (Signed)
Occupational Therapy Evaluation Patient Details Name: Eileen Wilkerson MRN: [id number] DOB: December 07, 1932 Today's Date: 12/29/2015    History of Present Illness Pt is an 82 yo female admitted with fever, cough, N/V and chest pain.  Pt - for pneumonia. Pt with dehydration.   Clinical Impression   Pt admitted for the above diagnosis and has the deficits listed below. Pt would benefit from cont OT to reach mod I to independent level of functioning with her basic adls so she can return home and help care for her husband.  Pt has supportive family and caregivers available to assist with husband.  Feel she will not need HHOT OT but will continue with acute services to ensure safe d/c home in regards to adls.     Follow Up Recommendations  No OT follow up;Supervision - Intermittent    Equipment Recommendations  None recommended by OT    Recommendations for Other Services       Precautions / Restrictions Precautions Precautions: Other (comment) (droplet precautions) Restrictions Weight Bearing Restrictions: No      Mobility Bed Mobility Overal bed mobility: Needs Assistance Bed Mobility: Supine to Sit     Supine to sit: Min assist     General bed mobility comments: min assist to come to a full sitting position.  Transfers Overall transfer level: Needs assistance Equipment used: 1 person hand held assist Transfers: Sit to/from [facility] Sit to Stand: Supervision Stand pivot transfers: Min guard       General transfer comment: Pt generally does not use AD and did not want to use one today.    Balance Overall balance assessment: Needs assistance Sitting-balance support: Feet supported Sitting balance-Leahy Scale: Good     Standing balance support: Bilateral upper extremity supported;During functional activity Standing balance-Leahy Scale: Fair Standing balance comment: Pt able to let go for short amounts of time but felt weak and therefore needed  outside assist to stand.                            ADL Overall ADL's : Needs assistance/impaired Eating/Feeding: Independent;Sitting   Grooming: Wash/dry hands;Wash/dry face;Oral care;Set up;Sitting Grooming Details (indicate cue type and reason): pt declined standing.  First time up and wanted to just go to chair. Upper Body Bathing: Set up;Sitting   Lower Body Bathing: Min guard;Sit to/from stand   Upper Body Dressing : Set up;Sitting   Lower Body Dressing: Minimal assistance;Sit to/from stand;Cueing for back precautions Lower Body Dressing Details (indicate cue type and reason): pt has had long standing low back pain.  Reviewed back precautions just for pain control. Toilet Transfer: Min guard;Ambulation;Comfort height toilet;Grab bars   Toileting- Clothing Manipulation and Hygiene: Minimal assistance;Sit to/from stand       Functional mobility during ADLs: Min guard General ADL Comments: This was pts first time up and she is just feeling weak but did well with adls. Pt mildly unsteady walking to bathroom but able to do most adls for herself with very little help.  Pt has had some incontinence with her cough and therefore had required some extra assist for toileting.     Vision Vision Assessment?: No apparent visual deficits   Perception     Praxis      Pertinent Vitals/Pain Pain Assessment: 0-10 Pain Score: 4  Pain Location: low back pain Pain Descriptors / Indicators: Aching Pain Intervention(s): Limited activity within patient's tolerance;Monitored during session;Repositioned     Hand Dominance  Right   Extremity/Trunk Assessment Upper Extremity Assessment Upper Extremity Assessment: Overall WFL for tasks assessed   Lower Extremity Assessment Lower Extremity Assessment: Defer to PT evaluation   Cervical / Trunk Assessment Cervical / Trunk Assessment: Normal   Communication Communication Communication: No difficulties   Cognition  Arousal/Alertness: Awake/alert Behavior During Therapy: WFL for tasks assessed/performed Overall Cognitive Status: Within Functional Limits for tasks assessed                     General Comments       Exercises       Shoulder Instructions      Home Living Family/patient expects to be discharged to:: Private residence Living Arrangements: Spouse/significant other Available Help at Discharge: Family;Available 24 hours/day Type of Home: House Home Access: Ramped entrance Entrance Stairs-Number of Steps: 3   Home Layout: One level     Bathroom Shower/Tub: Occupational psychologist: Standard     Home Equipment: Bedside commode;Shower seat - built in;Hand held shower head   Additional Comments: Pt cares for husband who is in a w/c and he also has care attendants.      Prior Functioning/Environment Level of Independence: Independent        Comments: Pt drives    OT Diagnosis: Generalized weakness   OT Problem List: Decreased strength;Decreased knowledge of use of DME or AE;Impaired balance (sitting and/or standing)   OT Treatment/Interventions: Self-care/ADL training;DME and/or AE instruction;Therapeutic activities    OT Goals(Current goals can be found in the care plan section) Acute Rehab OT Goals Patient Stated Goal: to go home OT Goal Formulation: With patient Time For Goal Achievement: 01/05/16 Potential to Achieve Goals: Good ADL Goals Pt Will Perform Grooming: with modified independence;standing Pt Will Perform Lower Body Dressing: with modified independence;sit to/from stand Pt Will Perform Tub/Shower Transfer: Shower transfer;with modified independence;shower seat;grab bars;ambulating Additional ADL Goal #1: Pt will walk to bathroom and toilet on 3:1 over commode with mod I.   OT Frequency: Min 2X/week   Barriers to D/C:            Co-evaluation              End of Session Nurse Communication: Mobility status  Activity  Tolerance: Patient tolerated treatment well Patient left: in chair;with call bell/phone within reach;with family/visitor present   Time: 1225-1300 OT Time Calculation (min): 35 min Charges:  OT General Charges $OT Visit: 1 Procedure OT Evaluation $OT Eval Moderate Complexity: 1 Procedure OT Treatments $Self Care/Home Management : 8-22 mins G-Codes:    Glenford Peers January 01, 2016, 1:07 PM  [id number]

## 2015-12-29 NOTE — Progress Notes (Signed)
[facility] PROGRESS NOTE  Eileen Wilkerson [id number] DOB: March 26, 1932 DOA: 12/28/2015 PCP: Thressa Sheller, MD  Assessment/Plan: Principal Problem:   Fever: presumed pneumonia - Pt currently on levaquin, Vancomyn, aztreonam - presumed pneumonia  Active Problems:   Dehydration - resolved    Rheumatoid arthritis ([place]) - holding methotrexate and xeljanz    Long term current use of systemic steroids - Continue solumedrol   Code Status: full Family Communication: None at bedside Disposition Plan: Pending resolution of pna   Consultants:  None  Procedures:  None  Antibiotics:  Vancomycin  Levaquin  Azactam  HPI/Subjective: Pt has no new complaints. No acute issues overnight  Objective: Filed Vitals:   12/29/15 0429 12/29/15 1351  BP: 130/81 138/67  Pulse: 98 95  Temp: 98.7 F (37.1 C) 98.5 F (36.9 C)  Resp: 18 19   No intake or output data in the 24 hours ending 12/29/15 1638 Filed Weights   12/28/15 1221  Weight: 94.348 kg (208 lb)    Exam:   General:  Pt in nad, alert and awake  Cardiovascular: rrr, no mrg  Respiratory: no wheezes, equal chest rise, no increased wob  Abdomen: soft, nd, nt  Musculoskeletal: no cyanosis or clubbing   Data Reviewed: Basic Metabolic Panel:  Recent Labs Lab 12/28/15 1227 12/29/15 0525  NA 140 138  K 3.1* 3.7  CL 100* 100*  CO2 29 26  GLUCOSE 132* 154*  BUN 17 9  CREATININE 0.86 0.68  CALCIUM 9.3 8.8*   Liver Function Tests:  Recent Labs Lab 12/28/15 1227 12/29/15 0525  AST 36 45*  ALT 28 29  ALKPHOS 64 54  BILITOT 1.1 0.4  PROT 8.4* 7.6  ALBUMIN 4.4 3.8   No results for input(s): LIPASE, AMYLASE in the last 168 hours. No results for input(s): AMMONIA in the last 168 hours. CBC:  Recent Labs Lab 12/28/15 1227 12/29/15 0525  WBC 8.4 10.0  NEUTROABS 6.3  --   HGB 13.7 14.2  HCT 43.3 44.3  MCV 105.6* 104.5*  PLT 285 228   Cardiac Enzymes:  Recent Labs Lab  12/28/15 1517 12/28/15 2125 12/29/15 0525  TROPONINI 0.10* 0.16* 0.13*   BNP (last 3 results) No results for input(s): BNP in the last 8760 hours.  ProBNP (last 3 results) No results for input(s): PROBNP in the last 8760 hours.  CBG:  Recent Labs Lab 12/28/15 1157  GLUCAP 82    Recent Results (from the past 240 hour(s))  Culture, blood (routine x 2)     Status: None (Preliminary result)   Collection Time: 12/28/15 12:15 PM  Result Value Ref Range Status   Specimen Description BLOOD LEFT ANTECUBITAL  Final   Special Requests BOTTLES DRAWN AEROBIC AND ANAEROBIC 5CC  Final   Culture   Final    NO GROWTH < 24 HOURS Performed at [facility]    Report Status PENDING  Incomplete  Culture, blood (routine x 2)     Status: None (Preliminary result)   Collection Time: 12/28/15 12:25 PM  Result Value Ref Range Status   Specimen Description BLOOD RIGHT ANTECUBITAL  Final   Special Requests BOTTLES DRAWN AEROBIC AND ANAEROBIC 5CC  Final   Culture   Final    NO GROWTH < 24 HOURS Performed at [facility]    Report Status PENDING  Incomplete  Urine culture     Status: None (Preliminary result)   Collection Time: 12/28/15 12:55 PM  Result Value Ref Range Status   Specimen  Description URINE, CLEAN CATCH  Final   Special Requests NONE  Final   Culture   Final    TOO YOUNG TO READ Performed at [facility]    Report Status PENDING  Incomplete     Studies: Dg Chest 2 View  12/29/2015  CLINICAL DATA:  Coughing for 1 week. Dry cough. Chest pain mid to right chest. EXAM: CHEST  2 VIEW COMPARISON:  12/28/2015 FINDINGS: Mild peribronchial thickening. Minimal density at the right lung base. Left lung is clear. Heart is normal size. No acute bony abnormality. IMPRESSION: Mild bronchitic changes. Minimal density in the right lung base could reflect atelectasis or infiltrate. Recommend follow-up in 3-6 weeks to ensure resolution. Electronically Signed   By: Rolm Baptise  M.D.   On: 12/29/2015 09:34   Dg Chest Port 1 View  12/28/2015  CLINICAL DATA:  Cough EXAM: PORTABLE CHEST 1 VIEW COMPARISON:  12/14/2015 FINDINGS: Lung volumes are low leading to bronchovascular crowding at the bases. Allowing for this, the lungs are clear. No pleural effusion or pneumothorax. Cardiac silhouette is normal in size. Normal mediastinal and hilar contours. Stable changes from previous anterior cervical spine fusion surgery. IMPRESSION: 1. No acute cardiopulmonary disease. Electronically Signed   By: Lajean Manes M.D.   On: 12/28/2015 13:09    Scheduled Meds: . aztreonam  2 g Intravenous 3 times per day  . docusate sodium  100 mg Oral BID  . enoxaparin (LOVENOX) injection  40 mg Subcutaneous Q24H  . levofloxacin (LEVAQUIN) IV  750 mg Intravenous Q24H  . methylPREDNISolone (SOLU-MEDROL) injection  40 mg Intravenous Q12H  . sodium chloride  3 mL Intravenous Q12H  . vancomycin  750 mg Intravenous Q12H   Continuous Infusions: . sodium chloride 100 mL/hr at 12/29/15 1145    Time spent: > 35 minutes    Velvet Bathe  [facility] Pager [phone]. If 7PM-7AM, please contact night-coverage at [email or web address], password [facility] 12/29/2015, 4:38 PM  LOS: 1 day

## 2015-12-30 LAB — URINE CULTURE

## 2015-12-30 MED ORDER — ACETAMINOPHEN 500 MG PO TABS
1000.0000 mg | ORAL_TABLET | Freq: Four times a day (QID) | ORAL | Status: DC | PRN
  Administered 2015-12-30 – 2016-01-02 (×8): 1000 mg via ORAL
  Filled 2015-12-30 (×8): qty 2

## 2015-12-30 MED ORDER — HYDROCOD POLST-CPM POLST ER 10-8 MG/5ML PO SUER
5.0000 mL | Freq: Once | ORAL | Status: AC
  Administered 2015-12-30: 5 mL via ORAL
  Filled 2015-12-30: qty 5

## 2015-12-30 MED ORDER — METHYLPREDNISOLONE SODIUM SUCC 40 MG IJ SOLR
40.0000 mg | Freq: Every day | INTRAMUSCULAR | Status: DC
  Administered 2015-12-31 – 2016-01-02 (×3): 40 mg via INTRAVENOUS
  Filled 2015-12-30 (×3): qty 1

## 2015-12-30 MED ORDER — BENZONATATE 100 MG PO CAPS
100.0000 mg | ORAL_CAPSULE | Freq: Two times a day (BID) | ORAL | Status: DC | PRN
  Administered 2015-12-30 (×2): 100 mg via ORAL
  Filled 2015-12-30 (×2): qty 1

## 2015-12-30 MED ORDER — ACETAMINOPHEN 650 MG RE SUPP: 650.0000 mg | Freq: Four times a day (QID) | RECTAL | Status: DC | PRN

## 2015-12-30 NOTE — Progress Notes (Signed)
[facility] PROGRESS NOTE  Eileen Wilkerson [id number] DOB: [birth year] DOA: 12/28/2015 PCP: Thressa Sheller, MD  Assessment/Plan: Principal Problem:   Fever: presumed pneumonia - Pt was on levaquin, Vancomyn, aztreonam. Will narrow to levaquin today however - presumed pneumonia  Active Problems:   Dehydration - resolved    Rheumatoid arthritis ([place]) - holding methotrexate and xeljanz    Long term current use of systemic steroids - Continue solumedrol but will change to 40 mg IV daily   Code Status: full Family Communication: None at bedside Disposition Plan: Pending resolution of pna   Consultants:  None  Procedures:  None  Antibiotics:  Vancomycin  Levaquin  Azactam  HPI/Subjective: Pt states she feels only minimally better today  Objective: Filed Vitals:   12/30/15 1424 12/30/15 1659  BP: 140/66 142/76  Pulse: 110 80  Temp: 97.9 F (36.6 C) 98.1 F (36.7 C)  Resp: 20 20    Intake/Output Summary (Last 24 hours) at 12/30/15 1745 Last data filed at 12/30/15 0939  Gross per 24 hour  Intake    340 ml  Output      0 ml  Net    340 ml   Filed Weights   12/28/15 1221  Weight: 94.348 kg (208 lb)    Exam:   General:  Pt in nad, alert and awake  Cardiovascular: rrr, no mrg  Respiratory: no wheezes, equal chest rise, no increased wob  Abdomen: soft, nd, nt  Musculoskeletal: no cyanosis or clubbing   Data Reviewed: Basic Metabolic Panel:  Recent Labs Lab 12/28/15 1227 12/29/15 0525  NA 140 138  K 3.1* 3.7  CL 100* 100*  CO2 29 26  GLUCOSE 132* 154*  BUN 17 9  CREATININE 0.86 0.68  CALCIUM 9.3 8.8*   Liver Function Tests:  Recent Labs Lab 12/28/15 1227 12/29/15 0525  AST 36 45*  ALT 28 29  ALKPHOS 64 54  BILITOT 1.1 0.4  PROT 8.4* 7.6  ALBUMIN 4.4 3.8   No results for input(s): LIPASE, AMYLASE in the last 168 hours. No results for input(s): AMMONIA in the last 168 hours. CBC:  Recent Labs Lab  12/28/15 1227 12/29/15 0525  WBC 8.4 10.0  NEUTROABS 6.3  --   HGB 13.7 14.2  HCT 43.3 44.3  MCV 105.6* 104.5*  PLT 285 228   Cardiac Enzymes:  Recent Labs Lab 12/28/15 1517 12/28/15 2125 12/29/15 0525  TROPONINI 0.10* 0.16* 0.13*   BNP (last 3 results) No results for input(s): BNP in the last 8760 hours.  ProBNP (last 3 results) No results for input(s): PROBNP in the last 8760 hours.  CBG:  Recent Labs Lab 12/28/15 1157  GLUCAP 82    Recent Results (from the past 240 hour(s))  Culture, blood (routine x 2)     Status: None (Preliminary result)   Collection Time: 12/28/15 12:15 PM  Result Value Ref Range Status   Specimen Description BLOOD LEFT ANTECUBITAL  Final   Special Requests BOTTLES DRAWN AEROBIC AND ANAEROBIC 5CC  Final   Culture   Final    NO GROWTH 2 DAYS Performed at [facility]    Report Status PENDING  Incomplete  Culture, blood (routine x 2)     Status: None (Preliminary result)   Collection Time: 12/28/15 12:25 PM  Result Value Ref Range Status   Specimen Description BLOOD RIGHT ANTECUBITAL  Final   Special Requests BOTTLES DRAWN AEROBIC AND ANAEROBIC 5CC  Final   Culture   Final  NO GROWTH 2 DAYS Performed at [facility]    Report Status PENDING  Incomplete  Urine culture     Status: None   Collection Time: 12/28/15 12:55 PM  Result Value Ref Range Status   Specimen Description URINE, CLEAN CATCH  Final   Special Requests NONE  Final   Culture   Final    MULTIPLE SPECIES PRESENT, SUGGEST RECOLLECTION Performed at [facility]    Report Status 12/30/2015 FINAL  Final     Studies: Dg Chest 2 View  12/29/2015  CLINICAL DATA:  Coughing for 1 week. Dry cough. Chest pain mid to right chest. EXAM: CHEST  2 VIEW COMPARISON:  12/28/2015 FINDINGS: Mild peribronchial thickening. Minimal density at the right lung base. Left lung is clear. Heart is normal size. No acute bony abnormality. IMPRESSION: Mild bronchitic  changes. Minimal density in the right lung base could reflect atelectasis or infiltrate. Recommend follow-up in 3-6 weeks to ensure resolution. Electronically Signed   By: Rolm Baptise M.D.   On: 12/29/2015 09:34    Scheduled Meds: . aztreonam  2 g Intravenous 3 times per day  . docusate sodium  100 mg Oral BID  . enoxaparin (LOVENOX) injection  40 mg Subcutaneous Q24H  . levofloxacin (LEVAQUIN) IV  750 mg Intravenous Q24H  . methylPREDNISolone (SOLU-MEDROL) injection  40 mg Intravenous Q12H  . sodium chloride  3 mL Intravenous Q12H  . vancomycin  750 mg Intravenous Q12H   Continuous Infusions:    Time spent: > 35 minutes    Velvet Bathe  [facility] Pager [phone]. If 7PM-7AM, please contact night-coverage at [email or web address], password [facility] 12/30/2015, 5:45 PM  LOS: 2 days             [facility] PROGRESS NOTE  Eileen Wilkerson [id number] DOB: 1932/12/10 DOA: 12/28/2015 PCP: Thressa Sheller, MD  Assessment/Plan: Principal Problem:   Fever: presumed pneumonia - Pt currently on levaquin, Vancomyn, aztreonam - presumed pneumonia  Active Problems:   Dehydration - resolved    Rheumatoid arthritis ([place]) - holding methotrexate and xeljanz    Long term current use of systemic steroids - Continue solumedrol   Code Status: full Family Communication: None at bedside Disposition Plan: Pending resolution of pna   Consultants:  None  Procedures:  None  Antibiotics:  Vancomycin  Levaquin  Azactam  HPI/Subjective: Pt has no new complaints. No acute issues overnight  Objective: Filed Vitals:   12/30/15 1424 12/30/15 1659  BP: 140/66 142/76  Pulse: 110 80  Temp: 97.9 F (36.6 C) 98.1 F (36.7 C)  Resp: 20 20    Intake/Output Summary (Last 24 hours) at 12/30/15 1746 Last data filed at 12/30/15 0939  Gross per 24 hour  Intake    340 ml  Output      0 ml  Net    340 ml   Filed Weights   12/28/15 1221  Weight: 94.348 kg (208  lb)    Exam:   General:  Pt in nad, alert and awake  Cardiovascular: rrr, no mrg  Respiratory: no wheezes, equal chest rise, no increased wob  Abdomen: soft, nd, nt  Musculoskeletal: no cyanosis or clubbing   Data Reviewed: Basic Metabolic Panel:  Recent Labs Lab 12/28/15 1227 12/29/15 0525  NA 140 138  K 3.1* 3.7  CL 100* 100*  CO2 29 26  GLUCOSE 132* 154*  BUN 17 9  CREATININE 0.86 0.68  CALCIUM 9.3 8.8*   Liver Function Tests:  Recent Labs Lab  12/28/15 1227 12/29/15 0525  AST 36 45*  ALT 28 29  ALKPHOS 64 54  BILITOT 1.1 0.4  PROT 8.4* 7.6  ALBUMIN 4.4 3.8   No results for input(s): LIPASE, AMYLASE in the last 168 hours. No results for input(s): AMMONIA in the last 168 hours. CBC:  Recent Labs Lab 12/28/15 1227 12/29/15 0525  WBC 8.4 10.0  NEUTROABS 6.3  --   HGB 13.7 14.2  HCT 43.3 44.3  MCV 105.6* 104.5*  PLT 285 228   Cardiac Enzymes:  Recent Labs Lab 12/28/15 1517 12/28/15 2125 12/29/15 0525  TROPONINI 0.10* 0.16* 0.13*   BNP (last 3 results) No results for input(s): BNP in the last 8760 hours.  ProBNP (last 3 results) No results for input(s): PROBNP in the last 8760 hours.  CBG:  Recent Labs Lab 12/28/15 1157  GLUCAP 82    Recent Results (from the past 240 hour(s))  Culture, blood (routine x 2)     Status: None (Preliminary result)   Collection Time: 12/28/15 12:15 PM  Result Value Ref Range Status   Specimen Description BLOOD LEFT ANTECUBITAL  Final   Special Requests BOTTLES DRAWN AEROBIC AND ANAEROBIC 5CC  Final   Culture   Final    NO GROWTH 2 DAYS Performed at [facility]    Report Status PENDING  Incomplete  Culture, blood (routine x 2)     Status: None (Preliminary result)   Collection Time: 12/28/15 12:25 PM  Result Value Ref Range Status   Specimen Description BLOOD RIGHT ANTECUBITAL  Final   Special Requests BOTTLES DRAWN AEROBIC AND ANAEROBIC 5CC  Final   Culture   Final    NO GROWTH 2  DAYS Performed at [facility]    Report Status PENDING  Incomplete  Urine culture     Status: None   Collection Time: 12/28/15 12:55 PM  Result Value Ref Range Status   Specimen Description URINE, CLEAN CATCH  Final   Special Requests NONE  Final   Culture   Final    MULTIPLE SPECIES PRESENT, SUGGEST RECOLLECTION Performed at [facility]    Report Status 12/30/2015 FINAL  Final     Studies: Dg Chest 2 View  12/29/2015  CLINICAL DATA:  Coughing for 1 week. Dry cough. Chest pain mid to right chest. EXAM: CHEST  2 VIEW COMPARISON:  12/28/2015 FINDINGS: Mild peribronchial thickening. Minimal density at the right lung base. Left lung is clear. Heart is normal size. No acute bony abnormality. IMPRESSION: Mild bronchitic changes. Minimal density in the right lung base could reflect atelectasis or infiltrate. Recommend follow-up in 3-6 weeks to ensure resolution. Electronically Signed   By: Rolm Baptise M.D.   On: 12/29/2015 09:34    Scheduled Meds: . aztreonam  2 g Intravenous 3 times per day  . docusate sodium  100 mg Oral BID  . enoxaparin (LOVENOX) injection  40 mg Subcutaneous Q24H  . levofloxacin (LEVAQUIN) IV  750 mg Intravenous Q24H  . methylPREDNISolone (SOLU-MEDROL) injection  40 mg Intravenous Q12H  . sodium chloride  3 mL Intravenous Q12H  . vancomycin  750 mg Intravenous Q12H   Continuous Infusions:    Time spent: > 35 minutes    Velvet Bathe  [facility] Pager [phone]. If 7PM-7AM, please contact night-coverage at [email or web address], password [facility] 12/30/2015, 5:46 PM  LOS: 2 days

## 2015-12-30 NOTE — Evaluation (Signed)
Physical Therapy Evaluation Patient Details Name: Eileen Wilkerson MRN: [id number] DOB: 1932-07-02 Today's Date: 12/30/2015   History of Present Illness  Pt is an 82 yo female admitted with fever, cough, N/V and chest pain.  Dx of PNA and dehydration.  Clinical Impression  Pt admitted with above diagnosis. Pt currently with functional limitations due to the deficits listed below (see PT Problem List). Pt ambulated 43' holding IV pole with min/guard assist, SaO2 94% on RA walking, HR 129 walking,  distance limited by fatigue Pt will benefit from skilled PT to increase their independence and safety with mobility to allow discharge to the venue listed below.       Follow Up Recommendations Home health PT    Equipment Recommendations  None recommended by PT    Recommendations for Other Services       Precautions / Restrictions Precautions Precautions: Fall Restrictions Weight Bearing Restrictions: No      Mobility  Bed Mobility               General bed mobility comments: NT up on commode  Transfers Overall transfer level: Needs assistance Equipment used: 1 person hand held assist Transfers: Sit to/from Stand Sit to Stand: Min guard         General transfer comment: min/guard for balance  Ambulation/Gait Ambulation/Gait assistance: Supervision Ambulation Distance (Feet): 70 Feet Assistive device: 1 person hand held assist Gait Pattern/deviations: WFL(Within Functional Limits)   Gait velocity interpretation: Below normal speed for age/gender General Gait Details: steady holding IV pole; HR 129 walking, SaO2 94% on RA walking, 2/4 dyspnea; fatigue limited activity tolerance  Stairs            Wheelchair Mobility    Modified Rankin (Stroke Patients Only)       Balance     Sitting balance-Leahy Scale: Good       Standing balance-Leahy Scale: Fair Standing balance comment: can stand unsupported for short periods, feels more secure with single  UE support                             Pertinent Vitals/Pain Pain Assessment: No/denies pain    Home Living Family/patient expects to be discharged to:: Private residence Living Arrangements: Spouse/significant other Available Help at Discharge: Family;Available 24 hours/day Type of Home: House Home Access: Ramped entrance   Entrance Stairs-Number of Steps: 3 Home Layout: One level Home Equipment: Bedside commode;Shower seat - built in;Hand held shower head Additional Comments: Pt cares for husband who is in a w/c and he also has care attendants.    Prior Function Level of Independence: Independent         Comments: Pt drives     Hand Dominance   Dominant Hand: Right    Extremity/Trunk Assessment   Upper Extremity Assessment: Overall WFL for tasks assessed           Lower Extremity Assessment: Overall WFL for tasks assessed      Cervical / Trunk Assessment: Normal  Communication   Communication: No difficulties  Cognition Arousal/Alertness: Awake/alert Behavior During Therapy: WFL for tasks assessed/performed Overall Cognitive Status: Within Functional Limits for tasks assessed                      General Comments      Exercises        Assessment/Plan    PT Assessment Patient needs continued PT services  PT Diagnosis Generalized  weakness   PT Problem List Decreased activity tolerance;Cardiopulmonary status limiting activity;Decreased mobility  PT Treatment Interventions Gait training;Functional mobility training;Therapeutic activities;Therapeutic exercise;Patient/family education;Balance training   PT Goals (Current goals can be found in the Care Plan section) Acute Rehab PT Goals Patient Stated Goal: to go home PT Goal Formulation: With patient Time For Goal Achievement: 01/13/16 Potential to Achieve Goals: Good    Frequency Min 3X/week   Barriers to discharge        Co-evaluation               End of  Session Equipment Utilized During Treatment: Gait belt Activity Tolerance: Patient limited by fatigue Patient left: in chair;with call bell/phone within reach Nurse Communication: Mobility status         Time: [id number] PT Time Calculation (min) (ACUTE ONLY): 15 min   Charges:   PT Evaluation $PT Eval Low Complexity: 1 Procedure     PT G CodesPhilomena Doheny 12/30/2015, 12:05 PM [phone]

## 2015-12-30 NOTE — Care Management Note (Signed)
Case Management Note  Patient Details  Name: KAYLEANN SAPIA MRN: [id number] Date of Birth: Jun 08, 1932  Subjective/Objective:        82 yo admitted with Fever            Action/Plan: From home with spouse  Expected Discharge Date:   (unknown)               Expected Discharge Plan:  [place]  In-House Referral:     Discharge planning Services  CM Consult  Post Acute Care Choice:    Choice offered to:     DME Arranged:    DME Agency:     HH Arranged:  RN, PT HH Agency:  [place]  Status of Service:  In process, will continue to follow  Medicare Important Message Given:    Date Medicare IM Given:    Medicare IM give by:    Date Additional Medicare IM Given:    Additional Medicare Important Message give by:     If discussed at [place] of Stay Meetings, dates discussed:    Additional Comments: PT recommendations discussed with pt. Pt states her husband had [place] previously and she would like to use them at discharge as well. Will need MD orders for HHPT/RN at discharge. [employer] rep given referral. CM will continue to follow. Lynnell Catalan, RN 12/30/2015, 2:24 PM

## 2015-12-31 ENCOUNTER — Inpatient Hospital Stay (HOSPITAL_COMMUNITY): Payer: Medicare Other

## 2015-12-31 MED ORDER — BENZONATATE 100 MG PO CAPS
200.0000 mg | ORAL_CAPSULE | Freq: Three times a day (TID) | ORAL | Status: DC | PRN
  Administered 2015-12-31 – 2016-01-02 (×4): 200 mg via ORAL
  Filled 2015-12-31 (×4): qty 2

## 2015-12-31 MED ORDER — BENZONATATE 100 MG PO CAPS: 200.0000 mg | ORAL_CAPSULE | Freq: Two times a day (BID) | ORAL | Status: DC | PRN

## 2015-12-31 MED ORDER — HYDROCODONE-HOMATROPINE 5-1.5 MG/5ML PO SYRP
5.0000 mL | ORAL_SOLUTION | Freq: Four times a day (QID) | ORAL | Status: DC | PRN
  Administered 2015-12-31 – 2016-01-02 (×5): 5 mL via ORAL
  Filled 2015-12-31 (×5): qty 5

## 2015-12-31 NOTE — Progress Notes (Signed)
PT Cancellation Note  Patient Details Name: Eileen Wilkerson MRN: [id number] DOB: 16-Sep-1932   Cancelled Treatment:    Reason Eval/Treat Not Completed: Fatigue/lethargy limiting ability to participate (attempted to see pt x 2 today, both times pt was resting and stated she's too fatigued to ambulate and needs to rest. Encouraged pt to walk with nursing later today.  Will follow. )   [name] 12/31/2015, 1:48 PM [phone]

## 2015-12-31 NOTE — Progress Notes (Signed)
Pharmacy Antibiotic Follow-up Note  Eileen Wilkerson is a 82 y.o. year-old female admitted on 12/28/2015.  The patient is currently on day 4 of Levaquin 750 mg IV q24h for probable pneumonia. On immunosuppressant therapy PTA (methotrexate and Xeljanz) for RA, now being held.  Afebrile and WBC WNL Reportedly remains fatigued  Assessment: Slow improvement of presumed pneumonia on antibiotic therapy, de-escalated to single-agent Levaquin 1/19 Current Levaquin dosage remains appropriate for renal function.  Plan: Continue Levaquin 750 mg q24h Consider transition to PO route Await word on anticipated LOS and duration of therapy   Temp (24hrs), Avg:97.9 F (36.6 C), Min:97.5 F (36.4 C), Max:98.2 F (36.8 C)   Recent Labs Lab 12/28/15 1227 12/29/15 0525  WBC 8.4 10.0    Recent Labs Lab 12/28/15 1227 12/29/15 0525  CREATININE 0.86 0.68   Estimated Creatinine Clearance: 62.6 mL/min (by C-G formula based on Cr of 0.68).    Allergies  Allergen Reactions  . Adhesive [Tape] Rash  . Celebrex [Celecoxib] Hives  . Cymbalta [Duloxetine Hcl] Swelling  . Gabitril [Tiagabine] Swelling  . Keflex [Cephalexin] Nausea And Vomiting  . Lyrica [Pregabalin] Swelling  . Neurontin [Gabapentin] Swelling  . Nexium [Esomeprazole] Rash  . Nsaids Rash  . Penicillins Rash    Injection site reaction Has patient had a PCN reaction causing immediate rash, facial/tongue/throat swelling, SOB or lightheadedness with hypotension: No Has patient had a PCN reaction causing severe rash involving mucus membranes or skin necrosis: No Has patient had a PCN reaction that required hospitalization No Has patient had a PCN reaction occurring within the last 10 years: No If all of the above answers are "NO", then may proceed with Cephalosporin use.   . Shrimp [Shellfish Allergy] Anaphylaxis  . Sulfa Antibiotics Other (See Comments)    "deathly ill"/nausea    Antimicrobials this admission: 1/17 Vanc  >>1/19 1/17 Aztreonam >> 1/19   1/17 Levaquin>>   Levels/dose changes this admission: N/A  Microbiology results: 1/17 BCx:  NGTD 1/17 UCx:  multiple species present, suggest recollection    Thank you for allowing pharmacy to be a part of this patient's care.  Clayburn Pert, PharmD, BCPS Pager: [phone] 12/31/2015  11:50 AM

## 2015-12-31 NOTE — Progress Notes (Signed)
Occupational Therapy Treatment Patient Details Name: Eileen Wilkerson MRN: [id number] DOB: [birth year] Today's Date: 12/31/2015    History of present illness Pt is an 82 yo female admitted with fever, cough, N/V and chest pain.  Dx of PNA and dehydration.   OT comments  Pt making very slow progress with adls.  Pt limited with activity tolerance due to pain in ribs and low back but is able to do most adls given extra time.  Pt's BP 159/61 and O2 sats were 98%.    Follow Up Recommendations  No OT follow up;Supervision - Intermittent    Equipment Recommendations  None recommended by OT    Recommendations for Other Services      Precautions / Restrictions Precautions Precautions: Fall Restrictions Weight Bearing Restrictions: No       Mobility Bed Mobility Overal bed mobility: Needs Assistance Bed Mobility: Supine to Sit     Supine to sit: Supervision;HOB elevated     General bed mobility comments: Pt moved well in bed but HOB was all the way up.  Transfers Overall transfer level: Needs assistance Equipment used: 1 person hand held assist Transfers: Sit to/from Stand Sit to Stand: Supervision Stand pivot transfers: Supervision       General transfer comment: Pt having pain with mobility but was steady on her feet.    Balance Overall balance assessment: Needs assistance Sitting-balance support: Feet supported Sitting balance-Leahy Scale: Good     Standing balance support: During functional activity;Single extremity supported Standing balance-Leahy Scale: Fair Standing balance comment: PT stood in bathroom and toileted and groomed without assist.                     ADL Overall ADL's : Needs assistance/impaired Eating/Feeding: Independent;Sitting   Grooming: Wash/dry hands;Wash/dry face;Oral care;Supervision/safety;Standing Grooming Details (indicate cue type and reason): pt agreed to stand in bathroom.  Balance good in bathroom.              Lower Body Dressing: Minimal assistance;Sit to/from stand;Cueing for back precautions Lower Body Dressing Details (indicate cue type and reason): cont to require min assist with LE dressing.  If this becomes the norm, will introduce sock aid. Toilet Transfer: Supervision/safety;Ambulation;Comfort height toilet;Grab bars   Toileting- Clothing Manipulation and Hygiene: Supervision/safety;Sit to/from stand Toileting - Clothing Manipulation Details (indicate cue type and reason): close supervision when pt in standing due to low back pain.     Functional mobility during ADLs: Supervision/safety General ADL Comments: Pt walked in room with hand held assist to start and then moved to just Supervision after up for a few steps.  Pt declined going in hallway due to fatigue.       Vision                     Perception     Praxis      Cognition   Behavior During Therapy: WFL for tasks assessed/performed Overall Cognitive Status: Within Functional Limits for tasks assessed                       Extremity/Trunk Assessment               Exercises     Shoulder Instructions       General Comments      Pertinent Vitals/ Pain       Pain Assessment: 0-10 Pain Score: 5  Pain Location: low back and ribs from coughing Pain Descriptors / Indicators:  Aching Pain Intervention(s): Monitored during session;Limited activity within patient's tolerance;Premedicated before session;Repositioned  Home Living                                          Prior Functioning/Environment              Frequency Min 2X/week     Progress Toward Goals  OT Goals(current goals can now be found in the care plan section)  Progress towards OT goals: Progressing toward goals  Acute Rehab OT Goals Patient Stated Goal: to go home OT Goal Formulation: With patient Time For Goal Achievement: 01/05/16 Potential to Achieve Goals: Good ADL Goals Pt Will Perform  Grooming: with modified independence;standing Pt Will Perform Lower Body Dressing: with modified independence;sit to/from stand Pt Will Perform Tub/Shower Transfer: Shower transfer;with modified independence;shower seat;grab bars;ambulating Additional ADL Goal #1: Pt will walk to bathroom and toilet on 3:1 over commode with mod I.   Plan Discharge plan remains appropriate    Co-evaluation                 End of Session     Activity Tolerance Patient tolerated treatment well   Patient Left in chair;with call bell/phone within reach;with family/visitor present   Nurse Communication Mobility status        Time: [id number] OT Time Calculation (min): 26 min  Charges: OT General Charges $OT Visit: 1 Procedure OT Treatments $Self Care/Home Management : 23-37 mins  Glenford Peers 12/31/2015, 10:24 AM [phone]

## 2015-12-31 NOTE — Progress Notes (Signed)
[facility] PROGRESS NOTE  Eileen Wilkerson [id number] DOB: 05-27-1932 DOA: 12/28/2015 PCP: Thressa Sheller, MD  Assessment/Plan: Principal Problem:   Fever: presumed pneumonia - Continue Levaquin - presumed pneumonia  Active Problems:   Dehydration - resolved    Rheumatoid arthritis ([place]) - holding methotrexate and xeljanz    Long term current use of systemic steroids - Continue solumedrol but will change to 40 mg IV daily   Code Status: full Family Communication: None at bedside Disposition Plan: Pending resolution of pna   Consultants:  None  Procedures:  None  Antibiotics:  Levaquin  HPI/Subjective: Pt states she feels better currently. Was requesting more cough medication  Objective: Filed Vitals:   12/31/15 1121 12/31/15 1445  BP:  154/81  Pulse:  99  Temp:  98.7 F (37.1 C)  Resp: 17     Intake/Output Summary (Last 24 hours) at 12/31/15 1708 Last data filed at 12/31/15 1300  Gross per 24 hour  Intake    960 ml  Output      0 ml  Net    960 ml   Filed Weights   12/28/15 1221  Weight: 94.348 kg (208 lb)    Exam:   General:  Pt in nad, alert and awake  Cardiovascular: rrr, no mrg  Respiratory: no wheezes, equal chest rise, no increased wob, + rhales  Abdomen: soft, nd, nt  Musculoskeletal: no cyanosis or clubbing   Data Reviewed: Basic Metabolic Panel:  Recent Labs Lab 12/28/15 1227 12/29/15 0525  NA 140 138  K 3.1* 3.7  CL 100* 100*  CO2 29 26  GLUCOSE 132* 154*  BUN 17 9  CREATININE 0.86 0.68  CALCIUM 9.3 8.8*   Liver Function Tests:  Recent Labs Lab 12/28/15 1227 12/29/15 0525  AST 36 45*  ALT 28 29  ALKPHOS 64 54  BILITOT 1.1 0.4  PROT 8.4* 7.6  ALBUMIN 4.4 3.8   No results for input(s): LIPASE, AMYLASE in the last 168 hours. No results for input(s): AMMONIA in the last 168 hours. CBC:  Recent Labs Lab 12/28/15 1227 12/29/15 0525  WBC 8.4 10.0  NEUTROABS 6.3  --   HGB 13.7 14.2  HCT  43.3 44.3  MCV 105.6* 104.5*  PLT 285 228   Cardiac Enzymes:  Recent Labs Lab 12/28/15 1517 12/28/15 2125 12/29/15 0525  TROPONINI 0.10* 0.16* 0.13*   BNP (last 3 results) No results for input(s): BNP in the last 8760 hours.  ProBNP (last 3 results) No results for input(s): PROBNP in the last 8760 hours.  CBG:  Recent Labs Lab 12/28/15 1157  GLUCAP 82    Recent Results (from the past 240 hour(s))  Culture, blood (routine x 2)     Status: None (Preliminary result)   Collection Time: 12/28/15 12:15 PM  Result Value Ref Range Status   Specimen Description BLOOD LEFT ANTECUBITAL  Final   Special Requests BOTTLES DRAWN AEROBIC AND ANAEROBIC 5CC  Final   Culture   Final    NO GROWTH 3 DAYS Performed at [facility]    Report Status PENDING  Incomplete  Culture, blood (routine x 2)     Status: None (Preliminary result)   Collection Time: 12/28/15 12:25 PM  Result Value Ref Range Status   Specimen Description BLOOD RIGHT ANTECUBITAL  Final   Special Requests BOTTLES DRAWN AEROBIC AND ANAEROBIC 5CC  Final   Culture   Final    NO GROWTH 3 DAYS Performed at [facility]  Report Status PENDING  Incomplete  Urine culture     Status: None   Collection Time: 12/28/15 12:55 PM  Result Value Ref Range Status   Specimen Description URINE, CLEAN CATCH  Final   Special Requests NONE  Final   Culture   Final    MULTIPLE SPECIES PRESENT, SUGGEST RECOLLECTION Performed at [facility]    Report Status 12/30/2015 FINAL  Final     Studies: Dg Chest 2 View  12/31/2015  CLINICAL DATA:  Cough, shortness of Breath.  Recent fever. EXAM: CHEST  2 VIEW COMPARISON:  12/29/2015 FINDINGS: Patchy airspace disease in the right lung base compatible with pneumonia as seen on prior study. No real change. Left lung is clear. Heart is normal size. No effusions. No acute bony abnormality. No visualized displaced rib fracture. No pneumothorax. IMPRESSION: Stable right basilar  airspace opacity concerning for pneumonia. Electronically Signed   By: Rolm Baptise M.D.   On: 12/31/2015 15:46    Scheduled Meds: . docusate sodium  100 mg Oral BID  . enoxaparin (LOVENOX) injection  40 mg Subcutaneous Q24H  . levofloxacin (LEVAQUIN) IV  750 mg Intravenous Q24H  . methylPREDNISolone (SOLU-MEDROL) injection  40 mg Intravenous Daily  . sodium chloride  3 mL Intravenous Q12H   Continuous Infusions:    Time spent: > 35 minutes    Velvet Bathe  [facility] Pager [phone]. If 7PM-7AM, please contact night-coverage at [email or web address], password [facility] 12/31/2015, 5:08 PM  LOS: 3 days

## 2015-12-31 NOTE — Progress Notes (Signed)
[id number] Davis,BSN,CCM,RN: [phone] Chart review \\ed  for needs and UR

## 2016-01-01 LAB — CBC
HCT: 34.4 % — ABNORMAL LOW (ref 36.0–46.0)
Hemoglobin: 11.2 g/dL — ABNORMAL LOW (ref 12.0–15.0)
MCH: 32.8 pg (ref 26.0–34.0)
MCHC: 32.6 g/dL (ref 30.0–36.0)
MCV: 100.9 fL — ABNORMAL HIGH (ref 78.0–100.0)
Platelets: 246 10*3/uL (ref 150–400)
RBC: 3.41 MIL/uL — ABNORMAL LOW (ref 3.87–5.11)
RDW: 15.1 % (ref 11.5–15.5)
WBC: 11 10*3/uL — ABNORMAL HIGH (ref 4.0–10.5)

## 2016-01-01 LAB — BASIC METABOLIC PANEL
Anion gap: 11 (ref 5–15)
BUN: 19 mg/dL (ref 6–20)
CO2: 26 mmol/L (ref 22–32)
Calcium: 9.1 mg/dL (ref 8.9–10.3)
Chloride: 107 mmol/L (ref 101–111)
Creatinine, Ser: 0.79 mg/dL (ref 0.44–1.00)
GFR calc Af Amer: >60 mL/min (ref >60)
GFR calc non Af Amer: >60 mL/min (ref >60)
Glucose, Bld: 95 mg/dL (ref 65–99)
Potassium: 3 mmol/L — ABNORMAL LOW (ref 3.5–5.1)
Sodium: 144 mmol/L (ref 135–145)

## 2016-01-01 MED ORDER — LEVOFLOXACIN 750 MG PO TABS
750.0000 mg | ORAL_TABLET | Freq: Every day | ORAL | Status: DC
  Administered 2016-01-02: 750 mg via ORAL
  Filled 2016-01-01: qty 1

## 2016-01-01 NOTE — Progress Notes (Signed)
[facility] PROGRESS NOTE  Eileen Wilkerson [id number] DOB: [birth year] DOA: 12/28/2015 PCP: Thressa Sheller, MD  Assessment/Plan: Principal Problem:   Fever: presumed pneumonia - Continue Levaquin - presumed pneumonia - Pt continues to have significant cough and sob with activity. Of note her WBC went back up (mildly). No fevers. Will reassess wbc levels next am. If worse will plan on changing antibiotic regimen.  Active Problems:   Dehydration - resolved    Rheumatoid arthritis ([place]) - holding methotrexate and xeljanz    Long term current use of systemic steroids - Continue solumedrol but will change to 40 mg IV daily   Code Status: full Family Communication: None at bedside Disposition Plan: Pending resolution of pna   Consultants:  None  Procedures:  None  Antibiotics:  Levaquin  HPI/Subjective: Pt still complaining of significant cough  Objective: Filed Vitals:   01/01/16 0620 01/01/16 1400  BP: 160/91 150/71  Pulse:  97  Temp: 97.7 F (36.5 C) 98.2 F (36.8 C)  Resp:  20    Intake/Output Summary (Last 24 hours) at 01/01/16 1705 Last data filed at 01/01/16 1300  Gross per 24 hour  Intake   1320 ml  Output      0 ml  Net   1320 ml   Filed Weights   12/28/15 1221  Weight: 94.348 kg (208 lb)    Exam:   General:  Pt in nad, alert and awake  Cardiovascular: rrr, no mrg  Respiratory: no wheezes, equal chest rise, no increased wob, + rhales  Abdomen: soft, nd, nt  Musculoskeletal: no cyanosis or clubbing   Data Reviewed: Basic Metabolic Panel:  Recent Labs Lab 12/28/15 1227 12/29/15 0525 01/01/16 0530  NA 140 138 144  K 3.1* 3.7 3.0*  CL 100* 100* 107  CO2 29 26 26   GLUCOSE 132* 154* 95  BUN 17 9 19   CREATININE 0.86 0.68 0.79  CALCIUM 9.3 8.8* 9.1   Liver Function Tests:  Recent Labs Lab 12/28/15 1227 12/29/15 0525  AST 36 45*  ALT 28 29  ALKPHOS 64 54  BILITOT 1.1 0.4  PROT 8.4* 7.6  ALBUMIN 4.4 3.8    No results for input(s): LIPASE, AMYLASE in the last 168 hours. No results for input(s): AMMONIA in the last 168 hours. CBC:  Recent Labs Lab 12/28/15 1227 12/29/15 0525 01/01/16 0530  WBC 8.4 10.0 11.0*  NEUTROABS 6.3  --   --   HGB 13.7 14.2 11.2*  HCT 43.3 44.3 34.4*  MCV 105.6* 104.5* 100.9*  PLT 285 228 246   Cardiac Enzymes:  Recent Labs Lab 12/28/15 1517 12/28/15 2125 12/29/15 0525  TROPONINI 0.10* 0.16* 0.13*   BNP (last 3 results) No results for input(s): BNP in the last 8760 hours.  ProBNP (last 3 results) No results for input(s): PROBNP in the last 8760 hours.  CBG:  Recent Labs Lab 12/28/15 1157  GLUCAP 82    Recent Results (from the past 240 hour(s))  Culture, blood (routine x 2)     Status: None (Preliminary result)   Collection Time: 12/28/15 12:15 PM  Result Value Ref Range Status   Specimen Description BLOOD LEFT ANTECUBITAL  Final   Special Requests BOTTLES DRAWN AEROBIC AND ANAEROBIC 5CC  Final   Culture   Final    NO GROWTH 4 DAYS Performed at [facility]    Report Status PENDING  Incomplete  Culture, blood (routine x 2)     Status: None (Preliminary result)   Collection Time:  12/28/15 12:25 PM  Result Value Ref Range Status   Specimen Description BLOOD RIGHT ANTECUBITAL  Final   Special Requests BOTTLES DRAWN AEROBIC AND ANAEROBIC 5CC  Final   Culture   Final    NO GROWTH 4 DAYS Performed at [facility]    Report Status PENDING  Incomplete  Urine culture     Status: None   Collection Time: 12/28/15 12:55 PM  Result Value Ref Range Status   Specimen Description URINE, CLEAN CATCH  Final   Special Requests NONE  Final   Culture   Final    MULTIPLE SPECIES PRESENT, SUGGEST RECOLLECTION Performed at [facility]    Report Status 12/30/2015 FINAL  Final     Studies: Dg Chest 2 View  12/31/2015  CLINICAL DATA:  Cough, shortness of Breath.  Recent fever. EXAM: CHEST  2 VIEW COMPARISON:  12/29/2015  FINDINGS: Patchy airspace disease in the right lung base compatible with pneumonia as seen on prior study. No real change. Left lung is clear. Heart is normal size. No effusions. No acute bony abnormality. No visualized displaced rib fracture. No pneumothorax. IMPRESSION: Stable right basilar airspace opacity concerning for pneumonia. Electronically Signed   By: Rolm Baptise M.D.   On: 12/31/2015 15:46    Scheduled Meds: . docusate sodium  100 mg Oral BID  . enoxaparin (LOVENOX) injection  40 mg Subcutaneous Q24H  . [START ON 01/02/2016] levofloxacin  750 mg Oral Daily  . methylPREDNISolone (SOLU-MEDROL) injection  40 mg Intravenous Daily  . sodium chloride  3 mL Intravenous Q12H   Continuous Infusions:    Time spent: > 35 minutes    Velvet Bathe  [facility] Pager [phone]. If 7PM-7AM, please contact night-coverage at [email or web address], password [facility] 01/01/2016, 5:05 PM  LOS: 4 days

## 2016-01-02 LAB — CULTURE, BLOOD (ROUTINE X 2)
Culture: NO GROWTH
Culture: NO GROWTH

## 2016-01-02 LAB — CBC
HCT: 36.5 % (ref 36.0–46.0)
Hemoglobin: 11.9 g/dL — ABNORMAL LOW (ref 12.0–15.0)
MCH: 33.3 pg (ref 26.0–34.0)
MCHC: 32.6 g/dL (ref 30.0–36.0)
MCV: 102.2 fL — ABNORMAL HIGH (ref 78.0–100.0)
Platelets: 276 10*3/uL (ref 150–400)
RBC: 3.57 MIL/uL — ABNORMAL LOW (ref 3.87–5.11)
RDW: 15.2 % (ref 11.5–15.5)
WBC: 9 10*3/uL (ref 4.0–10.5)

## 2016-01-02 MED ORDER — BENZONATATE 200 MG PO CAPS: 200.0000 mg | ORAL_CAPSULE | Freq: Three times a day (TID) | ORAL | Status: DC | PRN

## 2016-01-02 MED ORDER — LEVOFLOXACIN 500 MG PO TABS: 500.0000 mg | ORAL_TABLET | Freq: Every day | ORAL | Status: DC

## 2016-01-02 MED ORDER — DEXTROMETHORPHAN HBR 10 MG/15ML PO SYRP: 10.0000 mg | ORAL_SOLUTION | ORAL | Status: DC | PRN

## 2016-01-02 NOTE — Progress Notes (Signed)
Discharge teaching completed with teach back. Discharge instructions given and reviewed with pt. Prescriptions given for Tessalon and Levaquin. Answered questions. Pt. has [place] for home health nursing and physical therapy.  Arville Go still needs a discharge summary and final orders. I attempted to contact case management and left a message for case management, but pt. unwilling to wait to see case management.

## 2016-01-02 NOTE — Discharge Summary (Signed)
Physician Discharge Summary  Eileen Wilkerson [id number] DOB: 04-Aug-1932 DOA: 12/28/2015  PCP: Thressa Sheller, MD  Admit date: 12/28/2015 Discharge date: 01/02/2016  Time spent: *> 35 minutes  Recommendations for Outpatient Follow-up:  Please monitor breathing status and decide whether or not to continue antibiotics. Will discharge with for more days of antibiotics to complete a ten-day treatment regimen  Discharge Diagnoses:  Principal Problem:   Fever Active Problems:   Cough   Dehydration   Rheumatoid arthritis ([place])   Long term current use of systemic steroids   Discharge Condition: Stable  Diet recommendation: Regular diet  Filed Weights   12/28/15 1221  Weight: 94.348 kg (208 lb)    History of present illness:  From original HPI: 82 y.o. female with a past medical history of rheumatoid arthritis on chronic steroids who was in her usual state of health until this past Sunday when she started developing a cough. She was in her doctor's office last week and she said that the waiting room was full of people wearing masks. She denies any other sick contacts. Today, she was lethargic and slumped over her chair. No history of syncopal episode. She had fever. She was brought into the hospital. She's had a few episodes of nausea and vomiting. Dry heaves since she has been in the hospital. She also is complaining of central chest pain which has been present since Sunday. According to the daughter this has been on and off. No diarrhea. She is up-to-date on her immunizations. History is somewhat limited as she is mildly confused.  Hospital Course:  Fever: presumed pneumonia - Continue Levaquin. Treat for 4 more days to complete a 10 day treatment course - presumed pneumonia  Active Problems:  Dehydration - resolved   Rheumatoid arthritis ([place]) - holding methotrexate and xeljanz on discharge   Long term current use of systemic steroids - Continue home  regimen   Procedures:  None  Consultations:  None  Discharge Exam: Filed Vitals:   01/02/16 0446 01/02/16 1415  BP: 155/90 157/75  Pulse: 108 102  Temp: 97.9 F (36.6 C) 98.2 F (36.8 C)  Resp: 20 20    General: Pt in nad, alert and awake Cardiovascular: rrr, no mrg Respiratory: cta bl, no wheezes, + dry cough  Discharge Instructions   Discharge Instructions    Call MD for:  difficulty breathing, headache or visual disturbances    Complete by:  As directed      Call MD for:  temperature >100.4    Complete by:  As directed      Diet - low sodium heart healthy    Complete by:  As directed      Discharge instructions    Complete by:  As directed   Please follow up with your primary care physician in 1-2 weeks.  Also discuss with your rheumatologist when to continue your RA medication.     Increase activity slowly    Complete by:  As directed           Current Discharge Medication List    START taking these medications   Details  benzonatate (TESSALON) 200 MG capsule Take 1 capsule (200 mg total) by mouth 3 (three) times daily as needed for cough. Qty: 30 capsule, Refills: 0    Dextromethorphan HBr 10 MG/15ML SYRP Take 15 mLs (10 mg total) by mouth every 4 (four) hours as needed (cough). Qty: 1260 mL, Refills: 0    levofloxacin (LEVAQUIN) 500 MG tablet Take 1 tablet (  500 mg total) by mouth daily. Qty: 4 tablet, Refills: 0      CONTINUE these medications which have NOT CHANGED   Details  acetaminophen (TYLENOL) 650 MG CR tablet Take 1,300 mg by mouth 2 (two) times daily.    atorvastatin (LIPITOR) 20 MG tablet Take 20 mg by mouth At bedtime.    Biotin (BIOTIN MAXIMUM STRENGTH) 10 MG TABS Take 10 mg by mouth daily.    Cholecalciferol (VITAMIN D3) 5000 units CAPS Take 5,000 Units by mouth daily.    fexofenadine (ALLEGRA) 180 MG tablet Take 180 mg by mouth daily.    folic acid (FOLVITE) 1 MG tablet Take 1 mg by mouth 2 (two) times daily.     furosemide  (LASIX) 40 MG tablet Take 60 mg by mouth Daily.    predniSONE (DELTASONE) 10 MG tablet Take 5 mg by mouth daily with breakfast.    PREVACID 24HR 15 MG capsule Take 15 mg by mouth Daily.    SAVELLA 50 MG TABS Take 50 mg by mouth 2 (two) times daily.     traMADol (ULTRAM) 50 MG tablet Take 50 mg by mouth 3 (three) times daily as needed.     Zoledronic Acid (RECLAST IV) Inject into the vein. Once a year      STOP taking these medications     ciprofloxacin (CIPRO) 250 MG tablet      Dextromethorphan-Guaifenesin (MUCINEX FAST-MAX DM MAX) 5-100 MG/5ML LIQD      methocarbamol (ROBAXIN) 500 MG tablet      methotrexate 25 MG/ML injection      Tofacitinib Citrate (XELJANZ) 5 MG TABS        Allergies  Allergen Reactions  . Adhesive [Tape] Rash  . Celebrex [Celecoxib] Hives  . Cymbalta [Duloxetine Hcl] Swelling  . Gabitril [Tiagabine] Swelling  . Keflex [Cephalexin] Nausea And Vomiting  . Lyrica [Pregabalin] Swelling  . Neurontin [Gabapentin] Swelling  . Nexium [Esomeprazole] Rash  . Nsaids Rash  . Penicillins Rash    Injection site reaction Has patient had a PCN reaction causing immediate rash, facial/tongue/throat swelling, SOB or lightheadedness with hypotension: No Has patient had a PCN reaction causing severe rash involving mucus membranes or skin necrosis: No Has patient had a PCN reaction that required hospitalization No Has patient had a PCN reaction occurring within the last 10 years: No If all of the above answers are "NO", then may proceed with Cephalosporin use.   . Shrimp [Shellfish Allergy] Anaphylaxis  . Sulfa Antibiotics Other (See Comments)    "deathly ill"/nausea   Follow-up Information    Follow up with [facility]   Contact information:   [place] [address] [phone]        The results of significant diagnostics from this hospitalization (including imaging, microbiology, ancillary and laboratory) are listed  below for reference.    Significant Diagnostic Studies: Dg Chest 2 View  12/31/2015  CLINICAL DATA:  Cough, shortness of Breath.  Recent fever. EXAM: CHEST  2 VIEW COMPARISON:  12/29/2015 FINDINGS: Patchy airspace disease in the right lung base compatible with pneumonia as seen on prior study. No real change. Left lung is clear. Heart is normal size. No effusions. No acute bony abnormality. No visualized displaced rib fracture. No pneumothorax. IMPRESSION: Stable right basilar airspace opacity concerning for pneumonia. Electronically Signed   By: Rolm Baptise M.D.   On: 12/31/2015 15:46   Dg Chest 2 View  12/29/2015  CLINICAL DATA:  Coughing for 1  week. Dry cough. Chest pain mid to right chest. EXAM: CHEST  2 VIEW COMPARISON:  12/28/2015 FINDINGS: Mild peribronchial thickening. Minimal density at the right lung base. Left lung is clear. Heart is normal size. No acute bony abnormality. IMPRESSION: Mild bronchitic changes. Minimal density in the right lung base could reflect atelectasis or infiltrate. Recommend follow-up in 3-6 weeks to ensure resolution. Electronically Signed   By: Rolm Baptise M.D.   On: 12/29/2015 09:34   Dg Chest Port 1 View  12/28/2015  CLINICAL DATA:  Cough EXAM: PORTABLE CHEST 1 VIEW COMPARISON:  12/14/2015 FINDINGS: Lung volumes are low leading to bronchovascular crowding at the bases. Allowing for this, the lungs are clear. No pleural effusion or pneumothorax. Cardiac silhouette is normal in size. Normal mediastinal and hilar contours. Stable changes from previous anterior cervical spine fusion surgery. IMPRESSION: 1. No acute cardiopulmonary disease. Electronically Signed   By: Lajean Manes M.D.   On: 12/28/2015 13:09    Microbiology: Recent Results (from the past 240 hour(s))  Culture, blood (routine x 2)     Status: None   Collection Time: 12/28/15 12:15 PM  Result Value Ref Range Status   Specimen Description BLOOD LEFT ANTECUBITAL  Final   Special Requests BOTTLES  DRAWN AEROBIC AND ANAEROBIC 5CC  Final   Culture   Final    NO GROWTH 5 DAYS Performed at [facility]    Report Status 01/02/2016 FINAL  Final  Culture, blood (routine x 2)     Status: None   Collection Time: 12/28/15 12:25 PM  Result Value Ref Range Status   Specimen Description BLOOD RIGHT ANTECUBITAL  Final   Special Requests BOTTLES DRAWN AEROBIC AND ANAEROBIC 5CC  Final   Culture   Final    NO GROWTH 5 DAYS Performed at [facility]    Report Status 01/02/2016 FINAL  Final  Urine culture     Status: None   Collection Time: 12/28/15 12:55 PM  Result Value Ref Range Status   Specimen Description URINE, CLEAN CATCH  Final   Special Requests NONE  Final   Culture   Final    MULTIPLE SPECIES PRESENT, SUGGEST RECOLLECTION Performed at [facility]    Report Status 12/30/2015 FINAL  Final     Labs: Basic Metabolic Panel:  Recent Labs Lab 12/28/15 1227 12/29/15 0525 01/01/16 0530  NA 140 138 144  K 3.1* 3.7 3.0*  CL 100* 100* 107  CO2 29 26 26   GLUCOSE 132* 154* 95  BUN 17 9 19   CREATININE 0.86 0.68 0.79  CALCIUM 9.3 8.8* 9.1   Liver Function Tests:  Recent Labs Lab 12/28/15 1227 12/29/15 0525  AST 36 45*  ALT 28 29  ALKPHOS 64 54  BILITOT 1.1 0.4  PROT 8.4* 7.6  ALBUMIN 4.4 3.8   No results for input(s): LIPASE, AMYLASE in the last 168 hours. No results for input(s): AMMONIA in the last 168 hours. CBC:  Recent Labs Lab 12/28/15 1227 12/29/15 0525 01/01/16 0530 01/02/16 0023  WBC 8.4 10.0 11.0* 9.0  NEUTROABS 6.3  --   --   --   HGB 13.7 14.2 11.2* 11.9*  HCT 43.3 44.3 34.4* 36.5  MCV 105.6* 104.5* 100.9* 102.2*  PLT 285 228 246 276   Cardiac Enzymes:  Recent Labs Lab 12/28/15 1517 12/28/15 2125 12/29/15 0525  TROPONINI 0.10* 0.16* 0.13*   BNP: BNP (last 3 results) No results for input(s): BNP in the last 8760 hours.  ProBNP (last  3 results) No results for input(s): PROBNP in the last 8760  hours.  CBG:  Recent Labs Lab 12/28/15 1157  GLUCAP 82    Signed:  Velvet Bathe MD.  [facility] 01/02/2016, 3:52 PM

## 2016-01-02 NOTE — Progress Notes (Signed)
Placed an order for care management to follow up with home health needs. Pt. discharged to home. Left via wheelchair with NT and family. No respiratory distress noted.

## 2016-01-12 ENCOUNTER — Encounter: Payer: Medicare Other | Admitting: Physician Assistant

## 2016-05-01 ENCOUNTER — Other Ambulatory Visit (HOSPITAL_COMMUNITY): Payer: Self-pay | Admitting: Internal Medicine

## 2016-05-01 DIAGNOSIS — I509 Heart failure, unspecified: Secondary | ICD-10-CM

## 2016-05-15 ENCOUNTER — Other Ambulatory Visit: Payer: Self-pay

## 2016-05-15 ENCOUNTER — Ambulatory Visit (HOSPITAL_COMMUNITY): Payer: Medicare Other | Attending: Cardiology

## 2016-05-15 DIAGNOSIS — I509 Heart failure, unspecified: Secondary | ICD-10-CM | POA: Insufficient documentation

## 2016-05-15 DIAGNOSIS — I340 Nonrheumatic mitral (valve) insufficiency: Secondary | ICD-10-CM | POA: Diagnosis not present

## 2016-05-15 LAB — ECHOCARDIOGRAM COMPLETE
Ao-asc: 30 cm
E decel time: 296 msec
E/e' ratio: 9.27
FS: 39 % (ref 28–44)
IVS/LV PW RATIO, ED: 1.11
LA ID, A-P, ES: 29 cm
LA diam end sys: 29 cm
LA diam index: 1.37 cm/m2
LA vol A4C: 17 ml
LV E/e' medial: 9.27
LV E/e'average: 9.27
LV PW d: 9.17 mm — AB (ref 0.6–1.1)
LV dias vol index: 28 mL/m2
LV dias vol: 59 mL (ref 46–106)
LV e' LATERAL: 6.82 cm/s
LV sys vol index: 13 mL/m2
LV sys vol: 27 mL (ref 14–42)
LVOT SV: 41 cm3
LVOT VTI: 14.3 cm
LVOT area: 2.84 cm2
LVOT diameter: 19 mm
LVOT peak grad rest: 2 mmHg
LVOT peak vel: 75.2 cm/s
Lateral S' vel: 14.4 cm/s
MV Dec: 296
MV pk A vel: 104 m/s
MV pk E vel: 63.2 m/s
RV sys press: 22 mmHg
Reg peak vel: 216 cm/s
Simpson's disk: 54
Stroke v: 32 ml
TDI e' lateral: 6.82
TDI e' medial: 6.04
TR max vel: 216 m/s

## 2016-09-20 ENCOUNTER — Ambulatory Visit (INDEPENDENT_AMBULATORY_CARE_PROVIDER_SITE_OTHER): Payer: Medicare Other | Admitting: Rheumatology

## 2016-09-20 DIAGNOSIS — G4709 Other insomnia: Secondary | ICD-10-CM | POA: Diagnosis not present

## 2016-09-20 DIAGNOSIS — M797 Fibromyalgia: Secondary | ICD-10-CM

## 2016-09-20 DIAGNOSIS — Z09 Encounter for follow-up examination after completed treatment for conditions other than malignant neoplasm: Secondary | ICD-10-CM

## 2016-09-20 DIAGNOSIS — M0579 Rheumatoid arthritis with rheumatoid factor of multiple sites without organ or systems involvement: Secondary | ICD-10-CM | POA: Diagnosis not present

## 2016-09-21 ENCOUNTER — Other Ambulatory Visit: Payer: Self-pay | Admitting: Neurosurgery

## 2016-09-21 DIAGNOSIS — M5136 Other intervertebral disc degeneration, lumbar region: Secondary | ICD-10-CM

## 2016-10-02 ENCOUNTER — Other Ambulatory Visit: Payer: Self-pay | Admitting: Internal Medicine

## 2016-10-02 DIAGNOSIS — Z1231 Encounter for screening mammogram for malignant neoplasm of breast: Secondary | ICD-10-CM

## 2016-10-03 ENCOUNTER — Ambulatory Visit
Admission: RE | Admit: 2016-10-03 | Discharge: 2016-10-03 | Disposition: A | Discharge status: Home / Self Care | Payer: Medicare Other | Source: Ambulatory Visit | Attending: Neurosurgery | Admitting: Neurosurgery

## 2016-10-03 DIAGNOSIS — M5136 Other intervertebral disc degeneration, lumbar region: Secondary | ICD-10-CM

## 2016-10-31 ENCOUNTER — Ambulatory Visit
Admission: RE | Admit: 2016-10-31 | Discharge: 2016-10-31 | Disposition: A | Discharge status: Home / Self Care | Payer: Medicare Other | Source: Ambulatory Visit | Attending: Internal Medicine | Admitting: Internal Medicine

## 2016-10-31 DIAGNOSIS — Z1231 Encounter for screening mammogram for malignant neoplasm of breast: Secondary | ICD-10-CM

## 2016-11-01 ENCOUNTER — Other Ambulatory Visit: Payer: Self-pay | Admitting: Internal Medicine

## 2016-11-01 DIAGNOSIS — Z1231 Encounter for screening mammogram for malignant neoplasm of breast: Secondary | ICD-10-CM

## 2016-11-05 ENCOUNTER — Other Ambulatory Visit: Payer: Self-pay | Admitting: Rheumatology

## 2016-11-06 NOTE — Telephone Encounter (Signed)
09/20/16 last visit  08/24/16 labs WNL  Next visit 12/19/16 Eye exam 09/18/16  Ok to refill per Dr Estanislado Pandy

## 2016-11-27 ENCOUNTER — Other Ambulatory Visit: Payer: Self-pay | Admitting: Radiology

## 2016-11-27 DIAGNOSIS — Z79899 Other long term (current) drug therapy: Secondary | ICD-10-CM

## 2016-11-27 LAB — CBC WITH DIFFERENTIAL/PLATELET
Basophils Absolute: 108 cells/uL (ref 0–200)
Basophils Relative: 1 %
Eosinophils Absolute: 108 cells/uL (ref 15–500)
Eosinophils Relative: 1 %
HCT: 40.1 % (ref 35.0–45.0)
Hemoglobin: 13 g/dL (ref 11.7–15.5)
Lymphocytes Relative: 42 %
Lymphs Abs: 4536 cells/uL — ABNORMAL HIGH (ref 850–3900)
MCH: 33.1 pg — ABNORMAL HIGH (ref 27.0–33.0)
MCHC: 32.4 g/dL (ref 32.0–36.0)
MCV: 102 fL — ABNORMAL HIGH (ref 80.0–100.0)
MPV: 9.5 fL (ref 7.5–12.5)
Monocytes Absolute: 540 cells/uL (ref 200–950)
Monocytes Relative: 5 %
Neutro Abs: 5508 cells/uL (ref 1500–7800)
Neutrophils Relative %: 51 %
Platelets: 374 10*3/uL (ref 140–400)
RBC: 3.93 MIL/uL (ref 3.80–5.10)
RDW: 13.7 % (ref 11.0–15.0)
WBC: 10.8 10*3/uL (ref 3.8–10.8)

## 2016-11-28 LAB — COMPLETE METABOLIC PANEL WITH GFR
ALT: 21 U/L (ref 6–29)
AST: 24 U/L (ref 10–35)
Albumin: 4.3 g/dL (ref 3.6–5.1)
Alkaline Phosphatase: 44 U/L (ref 33–130)
BUN: 22 mg/dL (ref 7–25)
CO2: 32 mmol/L — ABNORMAL HIGH (ref 20–31)
Calcium: 10.3 mg/dL (ref 8.6–10.4)
Chloride: 101 mmol/L (ref 98–110)
Creat: 1.25 mg/dL — ABNORMAL HIGH (ref 0.60–0.88)
GFR, Est African American: 46 mL/min — ABNORMAL LOW (ref >=60)
GFR, Est Non African American: 40 mL/min — ABNORMAL LOW (ref >=60)
Glucose, Bld: 98 mg/dL (ref 65–99)
Potassium: 3.8 mmol/L (ref 3.5–5.3)
Sodium: 145 mmol/L (ref 135–146)
Total Bilirubin: 0.6 mg/dL (ref 0.2–1.2)
Total Protein: 7.4 g/dL (ref 6.1–8.1)

## 2016-11-28 NOTE — Progress Notes (Signed)
Reduce Otrexup to 15 mg sq qwk. Repeat BMP with GFR in 1 month.

## 2016-11-29 ENCOUNTER — Telehealth: Payer: Self-pay | Admitting: *Deleted

## 2016-11-29 MED ORDER — METHOTREXATE (PF) 15 MG/0.4ML ~~LOC~~ SOAJ: 15.0000 mg | SUBCUTANEOUS | 0 refills | Status: DC

## 2016-11-29 NOTE — Telephone Encounter (Signed)
Medication Samples have been provided to the patient.  Drug name: Otrexup     Strength: 15 mg        Qty: 2  LOTHT:[id number]  Exp.Date: 03/2017  Dosing instructions: Inject one pen SQ weekly.  The patient has been instructed regarding the correct time, dose, and frequency of taking this medication, including desired effects and most common side effects.   Gwenlyn Perking 11:33 AM 11/29/2016   Prescription sent to the pharmacy as well

## 2016-12-12 ENCOUNTER — Ambulatory Visit
Admission: RE | Admit: 2016-12-12 | Discharge: 2016-12-12 | Disposition: A | Discharge status: Home / Self Care | Payer: Medicare Other | Source: Ambulatory Visit | Attending: Internal Medicine | Admitting: Internal Medicine

## 2016-12-12 DIAGNOSIS — Z1231 Encounter for screening mammogram for malignant neoplasm of breast: Secondary | ICD-10-CM

## 2016-12-18 ENCOUNTER — Other Ambulatory Visit: Payer: Self-pay | Admitting: Rheumatology

## 2016-12-18 DIAGNOSIS — M810 Age-related osteoporosis without current pathological fracture: Secondary | ICD-10-CM | POA: Insufficient documentation

## 2016-12-18 DIAGNOSIS — M797 Fibromyalgia: Secondary | ICD-10-CM | POA: Insufficient documentation

## 2016-12-18 DIAGNOSIS — M65331 Trigger finger, right middle finger: Secondary | ICD-10-CM | POA: Insufficient documentation

## 2016-12-18 DIAGNOSIS — K219 Gastro-esophageal reflux disease without esophagitis: Secondary | ICD-10-CM

## 2016-12-18 DIAGNOSIS — M19071 Primary osteoarthritis, right ankle and foot: Secondary | ICD-10-CM | POA: Insufficient documentation

## 2016-12-18 DIAGNOSIS — M170 Bilateral primary osteoarthritis of knee: Secondary | ICD-10-CM | POA: Insufficient documentation

## 2016-12-18 DIAGNOSIS — M47812 Spondylosis without myelopathy or radiculopathy, cervical region: Secondary | ICD-10-CM | POA: Insufficient documentation

## 2016-12-18 DIAGNOSIS — M47816 Spondylosis without myelopathy or radiculopathy, lumbar region: Secondary | ICD-10-CM | POA: Insufficient documentation

## 2016-12-18 DIAGNOSIS — M19072 Primary osteoarthritis, left ankle and foot: Secondary | ICD-10-CM

## 2016-12-18 HISTORY — DX: Gastro-esophageal reflux disease without esophagitis: K21.9

## 2016-12-18 NOTE — Telephone Encounter (Signed)
09/20/16 last visit  08/24/16 labs WNL  Next visit 12/19/16  Okay to refill Prednisone?

## 2016-12-18 NOTE — Progress Notes (Signed)
Office Visit Note  Patient: Eileen Wilkerson             Date of Birth: 02/26/1932           MRN: [id number]             PCP: Thressa Sheller, MD Referring: Thressa Sheller, MD Visit Date: 12/19/2016 Occupation: '@GUAROCC' @    Subjective:  Follow-up (last set of labs/ Otrexup was changed to 22m) and Muscle Pain (states pain all over thinks FMS is flaring ) Follow-up on rheumatoid arthritis, Plaquenil and Otrexup, fibromyalgia.  History of Present Illness: Eileen Wilkerson a 83y.o. female  Last seen 09/20/2016. Patient feels that she is not doing well with her rheumatoid arthritis . She complains of hand pain bilaterally. She is using Plaquenil 200 mg daily She is taking Otrexup but we decreased her from 20 mg weekly to 15 mg weekly. This was done because she had frequent UTIs. So for short period of time, early October to mid November, she discontinued Otrexup on her own. Then when her urine infection cleared, she restarted the Otrexup at the lower dose. Currently she states that she has 3 month supply of Otrexup at home since she was just recently mailed per refill.  Patient suffers from fibromyalgia as well. She states that she hurts all over. She also experiences severe fatigue. She had this complaint back in October as well as today. She also states that since January 2017, after she was hospitalized, she continues to have ongoing chest wall pain. She uses the word costochondritis to describe her pain. She has not had a chance to discuss this with her PCP. Her PCP, Dr. BTrilby Drummer is no longer working. She also was assigned a new doctor and she keeps forgetting to tell the new doctor about her costochondritis concerns.  She also has a history of adrenal insufficiency. She sees Dr. BChalmers Caterfor this. Although we've been filling her prescription for prednisone for her, she should be getting the prednisone from her endocrinologist. Unfortunately she was taking a 10 mg pill and  splitting it in half to get her 5 mg dosage. And she was having a flare at the last visit. As result of this, I thought it best to prescribe her temporary prednisone to alleviate her flare and minimize risk of not taking the right dose of prednisone. Therefore I gave her 5 mg prednisone and 1 mg prednisone with the idea of using 7 mg and pearly. Eventually she was to taper down to 5 mg. Currently she is on 5 mg now. She is asking for refill which I'll be happy to do but future prednisone refills will need to come from Dr. BVic Ripperoffice.  She states that her fibromyalgia is not well-controlled at the moment with Savella. She wants to know if there is any other medication. Unfortunately the Cymbalta which would be a good substitute for the patient is not an option since she was allergic to Cymbalta. Please see her allergy list for full details. She has multiple allergies. It is important to note that she is also getting inadequate response from Humira and Orencia.  Activities of Daily Living:  Patient reports morning stiffness for 30 minutes.   Patient Reports nocturnal pain.  Difficulty dressing/grooming: Reports Difficulty climbing stairs: Reports Difficulty getting out of chair: Reports Difficulty using hands for taps, buttons, cutlery, and/or writing: Reports   No Rheumatology ROS completed.   PMFS History:  Patient Active Problem List   Diagnosis  Date Noted  . Fibromyalgia 12/18/2016  . DJD (degenerative joint disease), cervical 12/18/2016  . Spondylosis of lumbar region without myelopathy or radiculopathy 12/18/2016  . Other fatigue 12/18/2016  . Trigger finger, right middle finger 12/18/2016  . Primary osteoarthritis of both hands 12/18/2016  . Primary osteoarthritis of both feet 12/18/2016  . Primary osteoarthritis of both knees 12/18/2016  . History of gastroesophageal reflux (GERD) 12/18/2016  . Age-related osteoporosis without current pathological fracture 12/18/2016  . High  risk medication use 12/18/2016  . Fever 12/28/2015  . Cough 12/28/2015  . Dehydration 12/28/2015  . Rheumatoid arthritis ([place]) 12/28/2015  . Long term current use of systemic steroids 12/28/2015  . Lymphocytosis 11/13/2012    Past Medical History:  Diagnosis Date  . Adrenal failure ([place])   . Arthritis   . Cataract   . Fibromyalgia 2008  . Osteoporosis     Family History  Problem Relation Age of Onset  . Breast cancer Mother   . Diabetes Father    Past Surgical History:  Procedure Laterality Date  . BACK SURGERY    . carpel tunnel release  2005   bilateral wrists  . CATARACT EXTRACTION, BILATERAL  2004  . CERVICAL FUSION  2011,2010,2008   c 3--4,4-5, lower back  . HEEL SPUR SURGERY  2004  . rotater cuff  [id number]  . TONSILLECTOMY AND ADENOIDECTOMY  1947   Social History   Social History Narrative  . No narrative on file     Objective: Vital Signs: BP 136/84   Pulse 92   Resp 14   Ht 5' 4.5" (1.638 m)   Wt 176 lb (79.8 kg)   BMI 29.74 kg/m    Physical Exam   Musculoskeletal Exam:  Full range of motion of all joints except patient is hurting so she's doing them carefully and slowly. Grip strength is equal and strong bilaterally For myalgia tender points are 18 out of 18 positive  CDAI Exam: No CDAI exam completed.  Examination of the hands and wrists were done by myself as well as Dr. Estanislado Pandy. There is no synovitis on examination Please note that we did ultrasound of bilateral hands on 08/23/2016 visit. Please see below for full details. It is important to note that she had very mild synovitis and it was decided not to change her current treatment plan of Plaquenil and Otrexup since they were adequately addressing the patient's needs. Based on today's examination, she should continue the Plaquenil at 200 mg daily and Otrexup at 15 mg weekly.   Investigation: Findings:  08/23/2016 Ultrasound examination of bilateral hands today due to ongoing pain and  discomfort in her hands.   PROCEDURE:  After informed consent was obtained per EULAR recommendation, ultrasound examination of the bilateral hands was performed.  Using 12 MHz transducer, grayscale and power Doppler, bilateral 2nd, 3rd and 5th MCP joints and bilateral wrist joints both volar and dorsal aspects were evaluated to look for synovitis and tenosynovitis.  She had some MCP joint narrowing and synovial thickening.  Very mild synovitis was noted in the left 2nd MCP joint and right 5th MCP joint and right wrist joint.  The right median nerve was 0.08 cm square and left median nerve was 0.09 cm square which was bifid.  The median nerves were within normal limits.   IMPRESSION AND PLAN:  Rheumatoid arthritis.  She had synovial thickening, very mild synovitis.  I do not believe her disease is very active currently.  Her median nerves were also  within normal limits.  At this point she has been off [place] due to recent pneumonia.  She is on Otrexup and Plaquenil which she will continue.  We have decided not to change her medication.    08/31/2015 negative TB gold   12/02/2009  comprehensive metabolic panel showed GFR of 49.  Her glucose was slightly elevated at 118 and AST was 40.  ACE level, anti-CCP, CBC with diff, hepatitis panel, uric acid, rheumatoid factor, ANA, and serum protein electrophoresis were all normal.  Her sed rate was 64.  CK was 468.  Vitamin D was 22 and Parvo titer was 4.1.    Orders Only on 11/27/2016  Component Date Value Ref Range Status  . WBC 11/27/2016 10.8  3.8 - 10.8 K/uL Final  . RBC 11/27/2016 3.93  3.80 - 5.10 MIL/uL Final  . Hemoglobin 11/27/2016 13.0  11.7 - 15.5 g/dL Final  . HCT 11/27/2016 40.1  35.0 - 45.0 % Final  . MCV 11/27/2016 102.0* 80.0 - 100.0 fL Final  . MCH 11/27/2016 33.1* 27.0 - 33.0 pg Final  . MCHC 11/27/2016 32.4  32.0 - 36.0 g/dL Final  . RDW 11/27/2016 13.7  11.0 - 15.0 % Final  . Platelets 11/27/2016 374  140 - 400 K/uL Final  . MPV  11/27/2016 9.5  7.5 - 12.5 fL Final  . Neutro Abs 11/27/2016 5508  1,500 - 7,800 cells/uL Final  . Lymphs Abs 11/27/2016 4536* 850 - 3,900 cells/uL Final  . Monocytes Absolute 11/27/2016 540  200 - 950 cells/uL Final  . Eosinophils Absolute 11/27/2016 108  15 - 500 cells/uL Final  . Basophils Absolute 11/27/2016 108  0 - 200 cells/uL Final  . Neutrophils Relative % 11/27/2016 51  % Final  . Lymphocytes Relative 11/27/2016 42  % Final  . Monocytes Relative 11/27/2016 5  % Final  . Eosinophils Relative 11/27/2016 1  % Final  . Basophils Relative 11/27/2016 1  % Final  . Smear Review 11/27/2016 Criteria for review not met   Final  . Sodium 11/28/2016 145  135 - 146 mmol/L Final  . Potassium 11/28/2016 3.8  3.5 - 5.3 mmol/L Final  . Chloride 11/28/2016 101  98 - 110 mmol/L Final  . CO2 11/28/2016 32* 20 - 31 mmol/L Final  . Glucose, Bld 11/28/2016 98  65 - 99 mg/dL Final  . BUN 11/28/2016 22  7 - 25 mg/dL Final  . Creat 11/28/2016 1.25* 0.60 - 0.88 mg/dL Final   Comment:   For patients > or = 81 years of age: The upper reference limit for Creatinine is approximately 13% higher for people identified as African-American.     . Total Bilirubin 11/28/2016 0.6  0.2 - 1.2 mg/dL Final  . Alkaline Phosphatase 11/28/2016 44  33 - 130 U/L Final  . AST 11/28/2016 24  10 - 35 U/L Final  . ALT 11/28/2016 21  6 - 29 U/L Final  . Total Protein 11/28/2016 7.4  6.1 - 8.1 g/dL Final  . Albumin 11/28/2016 4.3  3.6 - 5.1 g/dL Final  . Calcium 11/28/2016 10.3  8.6 - 10.4 mg/dL Final  . GFR, Est African American 11/28/2016 46* >=60 mL/min Final  . GFR, Est Non African American 11/28/2016 40* >=60 mL/min Final     Imaging: Mm Screening Breast Tomo Bilateral  Result Date: 12/13/2016 CLINICAL DATA:  Screening. EXAM: 2D DIGITAL SCREENING BILATERAL MAMMOGRAM WITH CAD AND ADJUNCT TOMO COMPARISON:  Previous exam(s). ACR Breast Density Category b: There are scattered  areas of fibroglandular density. FINDINGS:  There are no findings suspicious for malignancy. Images were processed with CAD. IMPRESSION: No mammographic evidence of malignancy. A result letter of this screening mammogram will be mailed directly to the patient. RECOMMENDATION: Screening mammogram in one year. (Code:SM-B-01Y) BI-RADS CATEGORY  1: Negative. Electronically Signed   By: Lajean Manes M.D.   On: 12/13/2016 10:21    Speciality Comments: No specialty comments available.    Procedures:  No procedures performed Allergies: Adhesive [tape]; Celebrex [celecoxib]; Cymbalta [duloxetine hcl]; Gabitril [tiagabine]; Keflex [cephalexin]; Lyrica [pregabalin]; Neurontin [gabapentin]; Nexium [esomeprazole]; Nsaids; Penicillins; Shrimp [shellfish allergy]; and Sulfa antibiotics   Assessment / Plan:     Visit Diagnoses: Rheumatoid arthritis of multiple sites with negative rheumatoid factor (HCC) - Negative RF, negative CCP  High risk medication use - PLQ 200 mg d / Otrexup 20 mg subcutaneous every week , has had inadequate response to Humira and Arlana Pouch dcd after pneumonia in 03/2016, low GFR12/17  Fibromyalgia  Other fatigue  DJD cervical spine  DJD lumbar spine  Trigger finger, right middle finger  Primary osteoarthritis of both hands  Primary osteoarthritis of both feet  Primary osteoarthritis of both knees  History of gastroesophageal reflux (GERD)  Age-related osteoporosis without current pathological fracture   Plan:  #1: Rheumatoid arthritis is stable. No synovitis on examination of the hands or wrists. Examination was done by myself as well as Dr. Estanislado Pandy.  #2: High-risk prescription. Patient should continue Plaquenil 200 mg daily. She is on Otrexup 15 mg weekly. She should continue this dose for now. Note that on 20 mg of Otrexup, she was having repeat UTIs. She is not having those symptoms at the 15 mg dose at the moment.  #3: History of fibromyalgia syndrome. Hurting all over. Having costochondritis  probably as a result of the fibromyalgia. Patient is unable to switch therapy from [facility] to Cymbalta because she has a history of Cymbalta allergy. Therefore she should continue the Savella at the current dose. She should see her PCP for the costochondritis symptoms. It is important to note that she feels that the costochondritis started after her hospitalization back in January 2017. Please note that she does not have any associated shortness of breath, chest pain that radiates to the left shoulder or jaw, diaphoresis, other symptoms consistent with myocardial infarction. Her symptoms are consistent with costochondritis.  #4: Patient is on long-term prednisone. Patient would do well if she could taper off of the prednisone. She has adrenal insufficiency. She should see Dr. Chalmers Cater soon and discuss tapering off of the prednisone if appropriate. Currently she is on 5 mg every morning and she should continue that. As a courtesy to the patient, I will refill her prednisone 5 mg every morning on give her 90 day supply. I'm hopeful that in the next 2-3 months she'll be able to see her endocrinologist who could then take over prescribing the prednisone for her  #5: Return to clinic in 5 months.  #6: Patient is complaining of eye issues. We will refer her to an ophthalmologist for evaluation and treatment. She states that at times she is not able to see well. Note that her last Plaquenil eye exam was normal and her symptoms are not consistent with Plaquenil toxicity.  Orders: No orders of the defined types were placed in this encounter.  No orders of the defined types were placed in this encounter.   Face-to-face time spent with patient was 30 minutes. 50% of time was spent  in counseling and coordination of care.  Follow-Up Instructions: Return in about 5 months (around 05/19/2017), or RA,PLQ.   Eliezer Lofts, PA-C  Note - This record has been created using [employer].  Chart creation errors have  been sought, but may not always  have been located. Such creation errors do not reflect on  the standard of medical care.

## 2016-12-19 ENCOUNTER — Other Ambulatory Visit: Payer: Self-pay | Admitting: *Deleted

## 2016-12-19 ENCOUNTER — Ambulatory Visit (INDEPENDENT_AMBULATORY_CARE_PROVIDER_SITE_OTHER): Payer: Medicare Other | Admitting: Rheumatology

## 2016-12-19 ENCOUNTER — Encounter: Payer: Self-pay | Admitting: Rheumatology

## 2016-12-19 VITALS — BP 136/84 | HR 92 | Resp 14 | Ht 64.5 in | Wt 176.0 lb

## 2016-12-19 DIAGNOSIS — M797 Fibromyalgia: Secondary | ICD-10-CM

## 2016-12-19 DIAGNOSIS — M19071 Primary osteoarthritis, right ankle and foot: Secondary | ICD-10-CM

## 2016-12-19 DIAGNOSIS — M47816 Spondylosis without myelopathy or radiculopathy, lumbar region: Secondary | ICD-10-CM

## 2016-12-19 DIAGNOSIS — Z79899 Other long term (current) drug therapy: Secondary | ICD-10-CM | POA: Diagnosis not present

## 2016-12-19 DIAGNOSIS — R5383 Other fatigue: Secondary | ICD-10-CM

## 2016-12-19 DIAGNOSIS — M170 Bilateral primary osteoarthritis of knee: Secondary | ICD-10-CM

## 2016-12-19 DIAGNOSIS — M19072 Primary osteoarthritis, left ankle and foot: Secondary | ICD-10-CM

## 2016-12-19 DIAGNOSIS — M47812 Spondylosis without myelopathy or radiculopathy, cervical region: Secondary | ICD-10-CM

## 2016-12-19 DIAGNOSIS — M0609 Rheumatoid arthritis without rheumatoid factor, multiple sites: Secondary | ICD-10-CM

## 2016-12-19 DIAGNOSIS — Z8719 Personal history of other diseases of the digestive system: Secondary | ICD-10-CM

## 2016-12-19 DIAGNOSIS — M810 Age-related osteoporosis without current pathological fracture: Secondary | ICD-10-CM

## 2016-12-19 DIAGNOSIS — M19042 Primary osteoarthritis, left hand: Secondary | ICD-10-CM

## 2016-12-19 DIAGNOSIS — M19041 Primary osteoarthritis, right hand: Secondary | ICD-10-CM

## 2016-12-19 DIAGNOSIS — M65331 Trigger finger, right middle finger: Secondary | ICD-10-CM

## 2016-12-19 DIAGNOSIS — M5030 Other cervical disc degeneration, unspecified cervical region: Secondary | ICD-10-CM

## 2016-12-19 MED ORDER — PREDNISONE 5 MG PO TABS: 5.0000 mg | ORAL_TABLET | Freq: Every day | ORAL | 0 refills | Status: DC

## 2016-12-20 ENCOUNTER — Other Ambulatory Visit: Payer: Self-pay | Admitting: *Deleted

## 2016-12-20 ENCOUNTER — Telehealth: Payer: Self-pay | Admitting: *Deleted

## 2016-12-20 NOTE — Telephone Encounter (Signed)
Prior Authorization completed on cover my meds for Voltaren Gel .

## 2016-12-20 NOTE — Progress Notes (Unsigned)
vol

## 2016-12-27 ENCOUNTER — Telehealth: Payer: Self-pay | Admitting: Pharmacist

## 2016-12-27 NOTE — Telephone Encounter (Signed)
Received Denial of Medicare Prescription Drug Coverage for Voltaren Gel 1%.  Called patient to advised.  Informed her that she can use GoodRx coupon to get 1 tube of diclofenac 1% gel for approximately $22 at [facility].  Patient wrote down the information for how to access goodrx.com and voiced understanding.  Patient denied any further questions at this time.    [name], Pharm.D., BCPS, CPP Clinical Pharmacist Pager: [phone] Phone: [phone] 12/27/2016 9:10 AM

## 2017-01-18 ENCOUNTER — Other Ambulatory Visit: Payer: Self-pay | Admitting: Rheumatology

## 2017-01-18 NOTE — Telephone Encounter (Signed)
Last Visit: 12/19/16 Next Visit: 04/19/17 Labs: 11/27/16 Creat 1.25 GFR 40  Okay to refill Folic Acid?

## 2017-02-02 ENCOUNTER — Other Ambulatory Visit: Payer: Self-pay | Admitting: Rheumatology

## 2017-02-02 NOTE — Telephone Encounter (Signed)
200 mg po qd

## 2017-02-02 NOTE — Telephone Encounter (Signed)
Last Visit: 12/19/16 Next Visit: 04/19/17 LAbs: 11/27/16 Creat 1.25 GFR 40  PLQ Eye Exam: 09/18/16  Okay to refill PLQ?

## 2017-02-20 ENCOUNTER — Other Ambulatory Visit: Payer: Self-pay | Admitting: Rheumatology

## 2017-02-20 NOTE — Telephone Encounter (Signed)
Last Visit: 12/19/16 Next Visit: 04/19/17 UDS: 08/2015 Narc Agreement: 08/2015  Patient coming Thursday to update Narc agreement and UDS  Okay to refill 30 supply tramadol?

## 2017-02-22 ENCOUNTER — Other Ambulatory Visit: Payer: Self-pay | Admitting: Rheumatology

## 2017-02-22 ENCOUNTER — Other Ambulatory Visit: Payer: Self-pay | Admitting: *Deleted

## 2017-02-22 DIAGNOSIS — Z79899 Other long term (current) drug therapy: Secondary | ICD-10-CM

## 2017-02-22 DIAGNOSIS — Z5181 Encounter for therapeutic drug level monitoring: Secondary | ICD-10-CM

## 2017-02-22 LAB — CBC WITH DIFFERENTIAL/PLATELET
Basophils Absolute: 87 cells/uL (ref 0–200)
Basophils Relative: 1 %
Eosinophils Absolute: 87 cells/uL (ref 15–500)
Eosinophils Relative: 1 %
HCT: 38.4 % (ref 35.0–45.0)
Hemoglobin: 12.4 g/dL (ref 11.7–15.5)
Lymphocytes Relative: 43 %
Lymphs Abs: 3741 cells/uL (ref 850–3900)
MCH: 31.9 pg (ref 27.0–33.0)
MCHC: 32.3 g/dL (ref 32.0–36.0)
MCV: 98.7 fL (ref 80.0–100.0)
MPV: 9.9 fL (ref 7.5–12.5)
Monocytes Absolute: 870 cells/uL (ref 200–950)
Monocytes Relative: 10 %
Neutro Abs: 3915 cells/uL (ref 1500–7800)
Neutrophils Relative %: 45 %
Platelets: 308 10*3/uL (ref 140–400)
RBC: 3.89 MIL/uL (ref 3.80–5.10)
RDW: 15.5 % — ABNORMAL HIGH (ref 11.0–15.0)
WBC: 8.7 10*3/uL (ref 3.8–10.8)

## 2017-02-22 LAB — COMPLETE METABOLIC PANEL WITH GFR
ALT: 15 U/L (ref 6–29)
AST: 20 U/L (ref 10–35)
Albumin: 4.1 g/dL (ref 3.6–5.1)
Alkaline Phosphatase: 57 U/L (ref 33–130)
BUN: 16 mg/dL (ref 7–25)
CO2: 31 mmol/L (ref 20–31)
Calcium: 9.7 mg/dL (ref 8.6–10.4)
Chloride: 100 mmol/L (ref 98–110)
Creat: 1.14 mg/dL — ABNORMAL HIGH (ref 0.60–0.88)
GFR, Est African American: 52 mL/min — ABNORMAL LOW (ref >=60)
GFR, Est Non African American: 45 mL/min — ABNORMAL LOW (ref >=60)
Glucose, Bld: 94 mg/dL (ref 65–99)
Potassium: 3.3 mmol/L — ABNORMAL LOW (ref 3.5–5.3)
Sodium: 142 mmol/L (ref 135–146)
Total Bilirubin: 0.6 mg/dL (ref 0.2–1.2)
Total Protein: 7 g/dL (ref 6.1–8.1)

## 2017-02-22 NOTE — Telephone Encounter (Signed)
Last Visit: 12/19/16 Next Visit: 04/19/17 Labs: 11/27/16 Creat 1.25 GFR 40 previous creat 1.01 GFR 52  Okay to refill Otrexup?

## 2017-02-23 LAB — PAIN MGMT, PROFILE 5 W/CONF, U
Amphetamines: NEGATIVE ng/mL (ref <500)
Barbiturates: NEGATIVE ng/mL (ref <300)
Benzodiazepines: NEGATIVE ng/mL (ref <100)
Cocaine Metabolite: NEGATIVE ng/mL (ref <150)
Creatinine: 37.2 mg/dL (ref >=20.0)
Marijuana Metabolite: NEGATIVE ng/mL (ref <20)
Methadone Metabolite: NEGATIVE ng/mL (ref <100)
Opiates: NEGATIVE ng/mL (ref <100)
Oxidant: NEGATIVE ug/mL (ref <200)
Oxycodone: NEGATIVE ng/mL (ref <100)
pH: 7.14 (ref 4.5–9.0)

## 2017-02-25 NOTE — Progress Notes (Signed)
Labs stable

## 2017-03-02 ENCOUNTER — Other Ambulatory Visit: Payer: Self-pay | Admitting: Rheumatology

## 2017-03-02 NOTE — Telephone Encounter (Signed)
Ok to refill per Dr Estanislado Pandy

## 2017-03-08 ENCOUNTER — Other Ambulatory Visit: Payer: Self-pay | Admitting: Rheumatology

## 2017-03-08 NOTE — Telephone Encounter (Signed)
Last Visit: 12/19/16 Next Visit: 04/19/17  Okay to refill Robaxin?

## 2017-03-15 ENCOUNTER — Other Ambulatory Visit: Payer: Self-pay | Admitting: Rheumatology

## 2017-03-15 NOTE — Telephone Encounter (Signed)
Last Visit: 12/19/16 Next Visit: 04/19/17 UDS: 02/22/17 Narc Agreement: 02/22/17  Okay to refill Tramadol?

## 2017-03-16 ENCOUNTER — Telehealth: Payer: Self-pay | Admitting: Pharmacist

## 2017-03-16 NOTE — Telephone Encounter (Signed)
Received fax from [employer] regarding potential clinical concern.  Patient is currently prescribed Savella by Dr. Noah Delaine and tramadol from our office.  Concurrent use of tramadol and SSRI or SNRIs may increase the risk of seizures and serotonin syndrome.    Reviewed patient's chart and noted she has been on these medications since 2012.  Letter was reviewed by Mr. Carlyon Shadow (see attached).  I counseled patient on the risk of seizures and serotonin syndrome with tramadol and Savella.  Patient confirms she is tolerating the medication well and denies any adverse effects.  Patient reports taking the tramadol TID.  I counseled patient that tramadol should be used as needed.  Patient voiced understanding.  Patient denies any questions or concerns regarding her medications at this time.    [name], Pharm.D., BCPS, CPP Clinical Pharmacist Pager: [phone] Phone: [phone] 03/16/2017 12:02 PM

## 2017-03-29 ENCOUNTER — Ambulatory Visit (INDEPENDENT_AMBULATORY_CARE_PROVIDER_SITE_OTHER): Payer: Medicare Other

## 2017-03-29 ENCOUNTER — Ambulatory Visit (INDEPENDENT_AMBULATORY_CARE_PROVIDER_SITE_OTHER): Payer: Medicare Other | Admitting: Orthopedic Surgery

## 2017-03-29 ENCOUNTER — Encounter (INDEPENDENT_AMBULATORY_CARE_PROVIDER_SITE_OTHER): Payer: Self-pay | Admitting: Orthopedic Surgery

## 2017-03-29 ENCOUNTER — Encounter (INDEPENDENT_AMBULATORY_CARE_PROVIDER_SITE_OTHER): Payer: Self-pay

## 2017-03-29 VITALS — Ht 64.5 in | Wt 176.0 lb

## 2017-03-29 DIAGNOSIS — B351 Tinea unguium: Secondary | ICD-10-CM

## 2017-03-29 DIAGNOSIS — S92354A Nondisplaced fracture of fifth metatarsal bone, right foot, initial encounter for closed fracture: Secondary | ICD-10-CM

## 2017-03-29 DIAGNOSIS — M79671 Pain in right foot: Secondary | ICD-10-CM | POA: Diagnosis not present

## 2017-03-29 NOTE — Progress Notes (Signed)
Office Visit Note   Patient: Eileen Wilkerson           Date of Birth: [birth year]           MRN: [id number] Visit Date: 03/29/2017              Requested by: Thressa Sheller, MD [address] [place], [place] [address] PCP: Thressa Sheller, MD  Chief Complaint  Patient presents with  . Right Foot - Pain      HPI: The patient is an 83 year old woman who presents today complaining of swelling and pain over the base of the fifth metatarsal. She states she is up between bed which she must 2 stairs to climb in and out of. Was climbing out of bed and slipped off the stairs had a eversion injury of right foot. Has been having pain for the last month. Recently has begun swelling again. Complains of pain with palpation and with ambulation. Complains of fungal infection bilateral great toe nails. This causes pain. Is unable to wear closed toed shoes without pain.  Assessment & Plan: Visit Diagnoses:  1. Pain in right foot   2. Closed nondisplaced fracture of fifth metatarsal bone of right foot, initial encounter   3. Onychomycosis     Plan: States her Wood[employer] Ganja comforts and Birkenstocks are not comfortable. Will provide a post op shoe. Ambulate in this. May use ice. Activities as tolerated. Follow up in office in 2 more weeks.   Follow-Up Instructions: Return in about 2 weeks (around 04/12/2017).   Ortho Exam  Patient is alert, oriented, no adenopathy, well-dressed, normal affect, normal respiratory effort. Right foot is plantigrade. No open areas. No erythema. Does have point tenderness over 5th metatarsal. Ankle ligaments are nontender. Anterior drawer is stable. Trace edema right foot. Thickened and discolored onychomycotic nails bilateral great toes. These are tender. Slight erythema of surrounding skin. No drainage or cellulitis.   Imaging: Xr Foot Complete Right  Result Date: 03/29/2017 Radiographs of right foot show faint lucency of metaphysis of 5th metatarsal  consistent with fracture.   Labs: Lab Results  Component Value Date   REPTSTATUS 12/30/2015 FINAL 12/28/2015   CULT  12/28/2015    MULTIPLE SPECIES PRESENT, SUGGEST RECOLLECTION Performed at [facility]     Orders:  Orders Placed This Encounter  Procedures  . XR Foot Complete Right   No orders of the defined types were placed in this encounter.    Procedures: No procedures performed  Clinical Data: No additional findings.  ROS:  All other systems negative, except as noted in the HPI. Review of Systems  Constitutional: Negative for chills and fever.  Musculoskeletal: Positive for arthralgias.  Skin: Positive for color change and wound.    Objective: Vital Signs: Ht 5' 4.5" (1.638 m)   Wt 176 lb (79.8 kg)   BMI 29.74 kg/m   Specialty Comments:  No specialty comments available.  PMFS History: Patient Active Problem List   Diagnosis Date Noted  . Fibromyalgia 12/18/2016  . DJD (degenerative joint disease), cervical 12/18/2016  . Spondylosis of lumbar region without myelopathy or radiculopathy 12/18/2016  . Other fatigue 12/18/2016  . Trigger finger, right middle finger 12/18/2016  . Primary osteoarthritis of both hands 12/18/2016  . Primary osteoarthritis of both feet 12/18/2016  . Primary osteoarthritis of both knees 12/18/2016  . History of gastroesophageal reflux (GERD) 12/18/2016  . Age-related osteoporosis without current pathological fracture 12/18/2016  . High risk medication use 12/18/2016  . Fever 12/28/2015  .  Cough 12/28/2015  . Dehydration 12/28/2015  . Rheumatoid arthritis ([place]) 12/28/2015  . Long term current use of systemic steroids 12/28/2015  . Lymphocytosis 11/13/2012   Past Medical History:  Diagnosis Date  . Adrenal failure ([place])   . Arthritis   . Cataract   . Fibromyalgia 2008  . Osteoporosis     Family History  Problem Relation Age of Onset  . Breast cancer Mother   . Diabetes Father     Past Surgical History:    Procedure Laterality Date  . BACK SURGERY    . carpel tunnel release  2005   bilateral wrists  . CATARACT EXTRACTION, BILATERAL  2004  . CERVICAL FUSION  2011,2010,2008   c 3--4,4-5, lower back  . HEEL SPUR SURGERY  2004  . rotater cuff  [id number]  . TONSILLECTOMY AND ADENOIDECTOMY  1947   Social History   Occupational History  . Not on file.   Social History Main Topics  . Smoking status: Former Research scientist (life sciences)  . Smokeless tobacco: Never Used  . Alcohol use No  . Drug use: No  . Sexual activity: Not on file

## 2017-04-02 ENCOUNTER — Telehealth (INDEPENDENT_AMBULATORY_CARE_PROVIDER_SITE_OTHER): Payer: Self-pay | Admitting: Family

## 2017-04-02 NOTE — Telephone Encounter (Signed)
Patient is with her husband, but she was seen on April 19 and diagnosed with a broken foot.  She wants to know what she needs to do as far as weight bearing or keeping it elevated.  Thank you

## 2017-04-03 NOTE — Telephone Encounter (Signed)
I tried calling patient from home number listed in chart [phone]. There is no answer. I will try again later today. She is to be weightbearing as tolerated. If putting weight on her foot is too painful, minimize weightbearing and increase elevation. She should be elevating her foot the same number of hours she is on her feet for. Elevating to the level of her heart. She will follow up in two weeks for repeat evaluation.

## 2017-04-03 NOTE — Telephone Encounter (Signed)
Tried calling again and there is no answer.

## 2017-04-04 NOTE — Telephone Encounter (Signed)
I called and spoke with patient this morning to advise of message below.

## 2017-04-12 ENCOUNTER — Ambulatory Visit (INDEPENDENT_AMBULATORY_CARE_PROVIDER_SITE_OTHER): Payer: Medicare Other | Admitting: Orthopedic Surgery

## 2017-04-12 ENCOUNTER — Encounter (INDEPENDENT_AMBULATORY_CARE_PROVIDER_SITE_OTHER): Payer: Self-pay | Admitting: Orthopedic Surgery

## 2017-04-12 ENCOUNTER — Ambulatory Visit (INDEPENDENT_AMBULATORY_CARE_PROVIDER_SITE_OTHER): Payer: Medicare Other

## 2017-04-12 DIAGNOSIS — S92354D Nondisplaced fracture of fifth metatarsal bone, right foot, subsequent encounter for fracture with routine healing: Secondary | ICD-10-CM | POA: Diagnosis not present

## 2017-04-12 NOTE — Progress Notes (Signed)
Office Visit Note   Patient: Eileen Wilkerson           Date of Birth: [birth year]           MRN: [id number] Visit Date: 04/12/2017              Requested by: Thressa Sheller, MD [address], [place], [place] [address] PCP: Thressa Sheller, MD  Chief Complaint  Patient presents with  . Right Foot - Follow-up    Nondisplaced fracture 5th metatarsal      HPI: Patient states she's been having some increasing pain recently. He states it willing her toes are painful she complains of pain from her onychomycotic great toenails. She states she's used Super Glue in the past without relief.  Assessment & Plan: Visit Diagnoses:  1. Closed nondisplaced fracture of fifth metatarsal bone of right foot with routine healing, subsequent encounter     Plan: Recommend that she continue wearing the postoperative shoe will repeat 3 view radiographs of follow-up 4 weeks. Discussed the possibility of nail ablation as an inpatient surgery with the risk of infection potential risk of toe amputation. Patient states she understands would like to consider a nail ablation in the future.  Follow-Up Instructions: No Follow-up on file.   Ortho Exam  Patient is alert, oriented, no adenopathy, well-dressed, normal affect, normal respiratory effort. Examination patient has an antalgic gait she has thick and discolored onychomycotic nails but no evidence of a paronychial infection. The base of the fifth metatarsal is tender to palpation. There are no skin color or temperature changes no signs of infection.  Imaging: Xr Foot Complete Right  Result Date: 04/12/2017 Three-view radiographs of the right foot shows stable alignment of the metaphyseal diaphyseal fracture base of the fifth metatarsal right foot no evidence of any malunion or nonunion. The fracture appears to be healing as expected.   Labs: Lab Results  Component Value Date   REPTSTATUS 12/30/2015 FINAL 12/28/2015   CULT   12/28/2015    MULTIPLE SPECIES PRESENT, SUGGEST RECOLLECTION Performed at [facility]     Orders:  Orders Placed This Encounter  Procedures  . XR Foot Complete Right   No orders of the defined types were placed in this encounter.    Procedures: No procedures performed  Clinical Data: No additional findings.  ROS:  All other systems negative, except as noted in the HPI. Review of Systems  Objective: Vital Signs: There were no vitals taken for this visit.  Specialty Comments:  No specialty comments available.  PMFS History: Patient Active Problem List   Diagnosis Date Noted  . Nondisplaced fracture of fifth right metatarsal bone with routine healing 04/12/2017  . Fibromyalgia 12/18/2016  . DJD (degenerative joint disease), cervical 12/18/2016  . Spondylosis of lumbar region without myelopathy or radiculopathy 12/18/2016  . Other fatigue 12/18/2016  . Trigger finger, right middle finger 12/18/2016  . Primary osteoarthritis of both hands 12/18/2016  . Primary osteoarthritis of both feet 12/18/2016  . Primary osteoarthritis of both knees 12/18/2016  . History of gastroesophageal reflux (GERD) 12/18/2016  . Age-related osteoporosis without current pathological fracture 12/18/2016  . High risk medication use 12/18/2016  . Fever 12/28/2015  . Cough 12/28/2015  . Dehydration 12/28/2015  . Rheumatoid arthritis ([place]) 12/28/2015  . Long term current use of systemic steroids 12/28/2015  . Lymphocytosis 11/13/2012   Past Medical History:  Diagnosis Date  . Adrenal failure ([place])   . Arthritis   . Cataract   .  Fibromyalgia 2008  . Osteoporosis     Family History  Problem Relation Age of Onset  . Breast cancer Mother   . Diabetes Father     Past Surgical History:  Procedure Laterality Date  . BACK SURGERY    . carpel tunnel release  2005   bilateral wrists  . CATARACT EXTRACTION, BILATERAL  2004  . CERVICAL FUSION  2011,2010,2008   c 3--4,4-5, lower  back  . HEEL SPUR SURGERY  2004  . rotater cuff  [id number]  . TONSILLECTOMY AND ADENOIDECTOMY  1947   Social History   Occupational History  . Not on file.   Social History Main Topics  . Smoking status: Former Research scientist (life sciences)  . Smokeless tobacco: Never Used  . Alcohol use No  . Drug use: No  . Sexual activity: Not on file

## 2017-04-19 ENCOUNTER — Encounter: Payer: Self-pay | Admitting: Rheumatology

## 2017-04-19 ENCOUNTER — Ambulatory Visit (INDEPENDENT_AMBULATORY_CARE_PROVIDER_SITE_OTHER): Payer: Medicare Other | Admitting: Rheumatology

## 2017-04-19 ENCOUNTER — Other Ambulatory Visit: Payer: Self-pay | Admitting: Rheumatology

## 2017-04-19 VITALS — BP 124/70 | HR 78 | Resp 14 | Ht 64.5 in | Wt 175.0 lb

## 2017-04-19 DIAGNOSIS — G4709 Other insomnia: Secondary | ICD-10-CM

## 2017-04-19 DIAGNOSIS — R5383 Other fatigue: Secondary | ICD-10-CM | POA: Diagnosis not present

## 2017-04-19 DIAGNOSIS — M79645 Pain in left finger(s): Secondary | ICD-10-CM | POA: Diagnosis not present

## 2017-04-19 DIAGNOSIS — M0609 Rheumatoid arthritis without rheumatoid factor, multiple sites: Secondary | ICD-10-CM | POA: Diagnosis not present

## 2017-04-19 DIAGNOSIS — M797 Fibromyalgia: Secondary | ICD-10-CM

## 2017-04-19 DIAGNOSIS — Z79899 Other long term (current) drug therapy: Secondary | ICD-10-CM | POA: Diagnosis not present

## 2017-04-19 LAB — CBC WITH DIFFERENTIAL/PLATELET
Basophils Absolute: 91 cells/uL (ref 0–200)
Basophils Relative: 1 %
Eosinophils Absolute: 91 cells/uL (ref 15–500)
Eosinophils Relative: 1 %
HCT: 37.6 % (ref 35.0–45.0)
Hemoglobin: 12.3 g/dL (ref 11.7–15.5)
Lymphocytes Relative: 23 %
Lymphs Abs: 2093 cells/uL (ref 850–3900)
MCH: 32.7 pg (ref 27.0–33.0)
MCHC: 32.7 g/dL (ref 32.0–36.0)
MCV: 100 fL (ref 80.0–100.0)
MPV: 9.3 fL (ref 7.5–12.5)
Monocytes Absolute: 910 cells/uL (ref 200–950)
Monocytes Relative: 10 %
Neutro Abs: 5915 cells/uL (ref 1500–7800)
Neutrophils Relative %: 65 %
Platelets: 293 10*3/uL (ref 140–400)
RBC: 3.76 MIL/uL — ABNORMAL LOW (ref 3.80–5.10)
RDW: 15.4 % — ABNORMAL HIGH (ref 11.0–15.0)
WBC: 9.1 10*3/uL (ref 3.8–10.8)

## 2017-04-19 MED ORDER — METHOTREXATE (PF) 15 MG/0.4ML ~~LOC~~ SOAJ: SUBCUTANEOUS | 0 refills | Status: DC

## 2017-04-19 NOTE — Telephone Encounter (Signed)
Last Visit: 12/19/16 Next Visit: 04/19/17 UDS: 02/22/17 Narc Agreement: 02/22/17  Okay to refill Tramadol?

## 2017-04-19 NOTE — Progress Notes (Signed)
Office Visit Note  Patient: Eileen Wilkerson             Date of Birth: 1932/10/10           MRN: [id number]             PCP: Thressa Sheller, MD Referring: Thressa Sheller, MD Visit Date: 04/19/2017 Occupation: '@GUAROCC'$ @    Subjective: Right foot pain  History of Present Illness: Eileen Wilkerson is a 83 y.o. female  Last seen 12/19/2016  History of rheumatoid arthritis and fibromyalgia.  On Otrexup 20 mg weekly was reduced to 15 mg weekly because she had a history of frequent UTIs. On Plaquenil 200 mg daily Patient usually has a flare of rheumatoid arthritis during the winter months. She happily states that this winter she did not have a flare. She attributes doing better this winter because she was placed on Plaquenil in addition to her methotrexate. BASELINE PLQ EYE EXAM WAS OCT 2018; WILL BE DUE AGAIN OCT 2018.  Patient recently saw Dr. Sharol Given on 04/12/2017 for closed nondisplaced fifth metatarsal fracture right foot.  [address] as she was climbing into her bed (using bed stairs to climb into [facility] [place] bed).  Activities of Daily Living:  Patient reports morning stiffness for 15 minutes.   Patient Reports nocturnal pain.  Difficulty dressing/grooming: Denies Difficulty climbing stairs: Denies Difficulty getting out of chair: Denies Difficulty using hands for taps, buttons, cutlery, and/or writing: Reports   Review of Systems  Constitutional: Negative for fatigue.  HENT: Negative for mouth sores and mouth dryness.   Eyes: Negative for dryness.  Respiratory: Negative for shortness of breath.   Gastrointestinal: Negative for constipation and diarrhea.  Musculoskeletal: Negative for myalgias and myalgias.  Skin: Negative for sensitivity to sunlight.  Psychiatric/Behavioral: Negative for decreased concentration and sleep disturbance.    PMFS History:  Patient Active Problem List   Diagnosis Date Noted  . Nondisplaced fracture of fifth right metatarsal bone with  routine healing 04/12/2017  . Fibromyalgia 12/18/2016  . DJD (degenerative joint disease), cervical 12/18/2016  . Spondylosis of lumbar region without myelopathy or radiculopathy 12/18/2016  . Other fatigue 12/18/2016  . Trigger finger, right middle finger 12/18/2016  . Primary osteoarthritis of both hands 12/18/2016  . Primary osteoarthritis of both feet 12/18/2016  . Primary osteoarthritis of both knees 12/18/2016  . History of gastroesophageal reflux (GERD) 12/18/2016  . Age-related osteoporosis without current pathological fracture 12/18/2016  . High risk medication use 12/18/2016  . Fever 12/28/2015  . Cough 12/28/2015  . Dehydration 12/28/2015  . Rheumatoid arthritis ([place]) 12/28/2015  . Long term current use of systemic steroids 12/28/2015  . Lymphocytosis 11/13/2012    Past Medical History:  Diagnosis Date  . Adrenal failure ([place])   . Arthritis   . Cataract   . Fibromyalgia 2008  . Osteoporosis     Family History  Problem Relation Age of Onset  . Breast cancer Mother   . Diabetes Father    Past Surgical History:  Procedure Laterality Date  . BACK SURGERY    . carpel tunnel release  2005   bilateral wrists  . CATARACT EXTRACTION, BILATERAL  2004  . CERVICAL FUSION  2011,2010,2008   c 3--4,4-5, lower back  . HEEL SPUR SURGERY  2004  . rotater cuff  [id number]  . TONSILLECTOMY AND ADENOIDECTOMY  1947   Social History   Social History Narrative  . No narrative on file     Objective: Vital Signs: BP 124/70  Pulse 78   Resp 14   Ht 5' 4.5" (1.638 m)   Wt 175 lb (79.4 kg)   BMI 29.57 kg/m    Physical Exam  Constitutional: She is oriented to person, place, and time. She appears well-developed and well-nourished.  HENT:  Head: Normocephalic and atraumatic.  Eyes: EOM are normal. Pupils are equal, round, and reactive to light.  Cardiovascular: Normal rate, regular rhythm and normal heart sounds.  Exam reveals no gallop and no friction rub.   No murmur  heard. Pulmonary/Chest: Effort normal and breath sounds normal. She has no wheezes. She has no rales.  Abdominal: Soft. Bowel sounds are normal. She exhibits no distension. There is no tenderness. There is no guarding. No hernia.  Musculoskeletal: Normal range of motion. She exhibits no edema, tenderness or deformity.  Lymphadenopathy:    She has no cervical adenopathy.  Neurological: She is alert and oriented to person, place, and time. Coordination normal.  Skin: Skin is warm and dry. Capillary refill takes less than 2 seconds. No rash noted.  Psychiatric: She has a normal mood and affect. Her behavior is normal.  Nursing note and vitals reviewed.    Musculoskeletal Exam:  Full range of motion of all joints Grip strength is equal and strong bilaterally Fiber myalgia tender points are all absent  CDAI Exam: CDAI Homunculus Exam:   Tenderness:  Left hand: 1st PIP  Swelling:  Left hand: 1st PIP  Joint Counts:  CDAI Tender Joint count: 1 CDAI Swollen Joint count: 1  Global Assessments:  Patient Global Assessment: 6 Provider Global Assessment: 6  CDAI Calculated Score: 14    Investigation: No additional findings. Orders Only on 02/22/2017  Component Date Value Ref Range Status  . WBC 02/22/2017 8.7  3.8 - 10.8 K/uL Final  . RBC 02/22/2017 3.89  3.80 - 5.10 MIL/uL Final  . Hemoglobin 02/22/2017 12.4  11.7 - 15.5 g/dL Final  . HCT 02/22/2017 38.4  35.0 - 45.0 % Final  . MCV 02/22/2017 98.7  80.0 - 100.0 fL Final  . MCH 02/22/2017 31.9  27.0 - 33.0 pg Final  . MCHC 02/22/2017 32.3  32.0 - 36.0 g/dL Final  . RDW 02/22/2017 15.5* 11.0 - 15.0 % Final  . Platelets 02/22/2017 308  140 - 400 K/uL Final  . MPV 02/22/2017 9.9  7.5 - 12.5 fL Final  . Neutro Abs 02/22/2017 3915  1,500 - 7,800 cells/uL Final  . Lymphs Abs 02/22/2017 3741  850 - 3,900 cells/uL Final  . Monocytes Absolute 02/22/2017 870  200 - 950 cells/uL Final  . Eosinophils Absolute 02/22/2017 87  15 - 500  cells/uL Final  . Basophils Absolute 02/22/2017 87  0 - 200 cells/uL Final  . Neutrophils Relative % 02/22/2017 45  % Final  . Lymphocytes Relative 02/22/2017 43  % Final  . Monocytes Relative 02/22/2017 10  % Final  . Eosinophils Relative 02/22/2017 1  % Final  . Basophils Relative 02/22/2017 1  % Final  . Smear Review 02/22/2017 Criteria for review not met   Final  . Sodium 02/22/2017 142  135 - 146 mmol/L Final  . Potassium 02/22/2017 3.3* 3.5 - 5.3 mmol/L Final  . Chloride 02/22/2017 100  98 - 110 mmol/L Final  . CO2 02/22/2017 31  20 - 31 mmol/L Final  . Glucose, Bld 02/22/2017 94  65 - 99 mg/dL Final  . BUN 02/22/2017 16  7 - 25 mg/dL Final  . Creat 02/22/2017 1.14* 0.60 - 0.88 mg/dL  Final   Comment:   For patients > or = 81 years of age: The upper reference limit for Creatinine is approximately 13% higher for people identified as African-American.     . Total Bilirubin 02/22/2017 0.6  0.2 - 1.2 mg/dL Final  . Alkaline Phosphatase 02/22/2017 57  33 - 130 U/L Final  . AST 02/22/2017 20  10 - 35 U/L Final  . ALT 02/22/2017 15  6 - 29 U/L Final  . Total Protein 02/22/2017 7.0  6.1 - 8.1 g/dL Final  . Albumin 02/22/2017 4.1  3.6 - 5.1 g/dL Final  . Calcium 02/22/2017 9.7  8.6 - 10.4 mg/dL Final  . GFR, Est African American 02/22/2017 52* >=60 mL/min Final  . GFR, Est Non African American 02/22/2017 45* >=60 mL/min Final  . Prescribed Drug 1 02/22/2017 Tramadol   Final  . Creatinine 02/22/2017 37.2  > or = 20.0 mg/dL Final  . pH 02/22/2017 7.14  4.5 - 9.0 Final  . Oxidant 02/22/2017 NEGATIVE  <200 mcg/mL Final  . Amphetamines 02/22/2017 NEGATIVE  <500 ng/mL Final  . medMATCH Amphetamines 02/22/2017 CONSISTENT   Final  . Barbiturates 02/22/2017 NEGATIVE  <300 ng/mL Final  . medMATCH Barbiturates 02/22/2017 CONSISTENT   Final  . Benzodiazepines 02/22/2017 NEGATIVE  <100 ng/mL Final  . medMATCH Benzodiazepines 02/22/2017 CONSISTENT   Final  . Marijuana Metabolite 02/22/2017  NEGATIVE  <20 ng/mL Final  . medMATCH Marijuana Metab 02/22/2017 CONSISTENT   Final  . Cocaine Metabolite 02/22/2017 NEGATIVE  <150 ng/mL Final  . medMATCH Cocaine Metab 02/22/2017 CONSISTENT   Final  . Methadone Metabolite 02/22/2017 NEGATIVE  <100 ng/mL Final  . [facility] Methadone Metab 02/22/2017 CONSISTENT   Final  . Opiates 02/22/2017 NEGATIVE  <100 ng/mL Final  . medMATCH Opiates 02/22/2017 CONSISTENT   Final  . Oxycodone 02/22/2017 NEGATIVE  <100 ng/mL Final  . medMATCH Oxycodone 02/22/2017 CONSISTENT   Final  . See Note: 02/22/2017     Final   Comment: This drug testing is for medical treatment only.   Analysis was performed as non-forensic testing and  these results should be used only by healthcare  providers to render diagnosis or treatment, or to  monitor progress of medical conditions.   [facility] comments are:  - present when drug test results may be the result of     metabolism of one or more drugs or when results are     inconsistent with prescribed medication(s) listed.  - may be blank when drug results are consistent with     prescribed medication(s) listed.   For assistance with interpreting these drug results,  please contact a [employer] Toxicology  Specialist: [phone] [place] [phone]), M-F,  8am-6pm EST.   This drug testing is for medical treatment only. Analysis was performed as non-forensic testing and these results should be used only by healthcare providers to render diagnosis or treatment, or to monitor progress of medical conditions.   For assistance with interpreting these dr                          ug results, please contact a [employer] Toxicology Specialist: [phone] [place] [phone]), M-F, 8am-6pm EST.     Orders Only on 11/27/2016  Component Date Value Ref Range Status  . WBC 11/27/2016 10.8  3.8 - 10.8 K/uL Final  . RBC 11/27/2016 3.93  3.80 - 5.10 MIL/uL Final  . Hemoglobin 11/27/2016 13.0  11.7 - 15.5 g/dL  Final  .  HCT 11/27/2016 40.1  35.0 - 45.0 % Final  . MCV 11/27/2016 102.0* 80.0 - 100.0 fL Final  . MCH 11/27/2016 33.1* 27.0 - 33.0 pg Final  . MCHC 11/27/2016 32.4  32.0 - 36.0 g/dL Final  . RDW 11/27/2016 13.7  11.0 - 15.0 % Final  . Platelets 11/27/2016 374  140 - 400 K/uL Final  . MPV 11/27/2016 9.5  7.5 - 12.5 fL Final  . Neutro Abs 11/27/2016 5508  1,500 - 7,800 cells/uL Final  . Lymphs Abs 11/27/2016 4536* 850 - 3,900 cells/uL Final  . Monocytes Absolute 11/27/2016 540  200 - 950 cells/uL Final  . Eosinophils Absolute 11/27/2016 108  15 - 500 cells/uL Final  . Basophils Absolute 11/27/2016 108  0 - 200 cells/uL Final  . Neutrophils Relative % 11/27/2016 51  % Final  . Lymphocytes Relative 11/27/2016 42  % Final  . Monocytes Relative 11/27/2016 5  % Final  . Eosinophils Relative 11/27/2016 1  % Final  . Basophils Relative 11/27/2016 1  % Final  . Smear Review 11/27/2016 Criteria for review not met   Final  . Sodium 11/27/2016 145  135 - 146 mmol/L Final  . Potassium 11/27/2016 3.8  3.5 - 5.3 mmol/L Final  . Chloride 11/27/2016 101  98 - 110 mmol/L Final  . CO2 11/27/2016 32* 20 - 31 mmol/L Final  . Glucose, Bld 11/27/2016 98  65 - 99 mg/dL Final  . BUN 11/27/2016 22  7 - 25 mg/dL Final  . Creat 11/27/2016 1.25* 0.60 - 0.88 mg/dL Final   Comment:   For patients > or = 81 years of age: The upper reference limit for Creatinine is approximately 13% higher for people identified as African-American.     . Total Bilirubin 11/27/2016 0.6  0.2 - 1.2 mg/dL Final  . Alkaline Phosphatase 11/27/2016 44  33 - 130 U/L Final  . AST 11/27/2016 24  10 - 35 U/L Final  . ALT 11/27/2016 21  6 - 29 U/L Final  . Total Protein 11/27/2016 7.4  6.1 - 8.1 g/dL Final  . Albumin 11/27/2016 4.3  3.6 - 5.1 g/dL Final  . Calcium 11/27/2016 10.3  8.6 - 10.4 mg/dL Final  . GFR, Est African American 11/27/2016 46* >=60 mL/min Final  . GFR, Est Non African American 11/27/2016 40* >=60 mL/min Final      Imaging: Xr Foot Complete Right  Result Date: 04/12/2017 Three-view radiographs of the right foot shows stable alignment of the metaphyseal diaphyseal fracture base of the fifth metatarsal right foot no evidence of any malunion or nonunion. The fracture appears to be healing as expected.  Xr Foot Complete Right  Result Date: 03/29/2017 Radiographs of right foot show faint lucency of metaphysis of 5th metatarsal consistent with fracture.   Speciality Comments: No specialty comments available.   Procedures:  No procedures performed Allergies: Adhesive [tape]; Celebrex [celecoxib]; Cymbalta [duloxetine hcl]; Gabitril [tiagabine]; Keflex [cephalexin]; Lyrica [pregabalin]; Neurontin [gabapentin]; Nexium [esomeprazole]; Nsaids; Penicillins; Shrimp [shellfish allergy]; and Sulfa antibiotics   Assessment / Plan:     Visit Diagnoses: High risk medications (not anticoagulants) long-term use - 04/19/2017: Otrexup 15 mg every week; folic acid 2 mg daily; PLQ 200 mg QD(adequate response) except left first DIP w/ possible synovitis vs. severe OA. - Plan: CBC with Differential/Platelet, COMPLETE METABOLIC PANEL WITH GFR  Rheumatoid arthritis of multiple sites with negative rheumatoid factor (HCC)  Thumb pain, left - 04/19/2017: Left first DIP with possible synovitis versus severe OA  Fibromyalgia  Other fatigue  Other insomnia   Plan: #1: Rheumatoid arthritis No joint pain, swelling, stiffness except left first DIP with swelling and pain. Differential includes possible synovitis (we will do ultrasound to rule out) versus severe OA. At this time we will continue Otrexup at 15 mg per week; folic acid 2 mg daily; Plaquenil 200 mg daily  Plan: Schedule ultrasound and office visit at the next available to do bilateral ultrasound of hands and wrist; specifically, rule out synovitis of the left first DIP joint. If positive for synovitis, consider increasing Otrexup 20 mg.  ( Note: Patient has  a history of UTIs with elevated Otrexup in the past and has done really well with combination of Otrexup and Plaquenil.)  Plaquenil eye exam was normal baseline October 2017 and Will be due October 2018  #2: High risk prescription Otrexup 15 mg every week Folic acid 2 mg daily Plaquenil 200 mg daily Plaquenil eye exam normal October 2017  #3: Left hand pain at the left first DIP.Differential includes possible synovitis (we will do ultrasound to rule out) versus severe OA.  #4: Fibromyalgia. Active disease with generalized pain and positive tender points 18/18  #5 insomnia and fatigue. Ongoing.  #6: Right foot fracture. Saw Dr. Sharol Given for this. Please see Epic for full details. Patient is currently wearing a boot for this.  #7: Return to clinic in mid July for ultrasound of bilateral hands and specifically look at the left first DIP joint for synovitis and an office visit to adjust medication if needed.  #8: Last labs were March 2018 and were within normal limits except for slight elevation of creatinine and slight decrease in GFR. (Not as bad compared to December 2017 kidney function.)  CBC with differential, CMP with GFR today in office  Orders: Orders Placed This Encounter  Procedures  . CBC with Differential/Platelet  . COMPLETE METABOLIC PANEL WITH GFR   Meds ordered this encounter  Medications  . Methotrexate, PF, (OTREXUP) 15 MG/0.4ML SOAJ    Sig: INJECT ONE PEN (15 MG) SUBCUTANEOUSLY ONCE EVERY WEEK. STORE AT ROOM TEMPERATURE BETWEEN 68 - 45 DEGREES F.    Dispense:  12 pen    Refill:  0    Order Specific Question:   Supervising Provider    Answer:   Bo Merino [phone]    Face-to-face time spent with patient was 30 minutes. 50% of time was spent in counseling and coordination of care.  Follow-Up Instructions: Return WILL HAVE OFFICE VISIT W/ ULTRASOUND, for RA//OTREXUP 15MG//PLQ 200 MG QD//RT FOOT FX//LEFT 1ST DIP SYNOVITIS.   Eliezer Lofts, PA-C  Note -  This record has been created using [employer].  Chart creation errors have been sought, but may not always  have been located. Such creation errors do not reflect on  the standard of medical care.

## 2017-04-20 LAB — COMPLETE METABOLIC PANEL WITH GFR
ALT: 14 U/L (ref 6–29)
AST: 19 U/L (ref 10–35)
Albumin: 3.9 g/dL (ref 3.6–5.1)
Alkaline Phosphatase: 56 U/L (ref 33–130)
BUN: 18 mg/dL (ref 7–25)
CO2: 32 mmol/L — ABNORMAL HIGH (ref 20–31)
Calcium: 9.8 mg/dL (ref 8.6–10.4)
Chloride: 101 mmol/L (ref 98–110)
Creat: 1.12 mg/dL — ABNORMAL HIGH (ref 0.60–0.88)
GFR, Est African American: 53 mL/min — ABNORMAL LOW (ref >=60)
GFR, Est Non African American: 46 mL/min — ABNORMAL LOW (ref >=60)
Glucose, Bld: 95 mg/dL (ref 65–99)
Potassium: 3.3 mmol/L — ABNORMAL LOW (ref 3.5–5.3)
Sodium: 144 mmol/L (ref 135–146)
Total Bilirubin: 0.5 mg/dL (ref 0.2–1.2)
Total Protein: 6.7 g/dL (ref 6.1–8.1)

## 2017-04-25 ENCOUNTER — Encounter (HOSPITAL_COMMUNITY): Payer: Self-pay | Admitting: Emergency Medicine

## 2017-04-25 ENCOUNTER — Inpatient Hospital Stay (HOSPITAL_COMMUNITY)
Admission: EM | Admit: 2017-04-25 | Discharge: 2017-04-27 | DRG: 871 | Disposition: A | Discharge status: Home Health | Payer: Medicare Other | Attending: Internal Medicine | Admitting: Internal Medicine

## 2017-04-25 ENCOUNTER — Emergency Department (HOSPITAL_COMMUNITY): Payer: Medicare Other

## 2017-04-25 DIAGNOSIS — M0609 Rheumatoid arthritis without rheumatoid factor, multiple sites: Secondary | ICD-10-CM | POA: Diagnosis not present

## 2017-04-25 DIAGNOSIS — E876 Hypokalemia: Secondary | ICD-10-CM | POA: Diagnosis not present

## 2017-04-25 DIAGNOSIS — Y95 Nosocomial condition: Secondary | ICD-10-CM | POA: Diagnosis present

## 2017-04-25 DIAGNOSIS — Z7952 Long term (current) use of systemic steroids: Secondary | ICD-10-CM | POA: Diagnosis not present

## 2017-04-25 DIAGNOSIS — N179 Acute kidney failure, unspecified: Secondary | ICD-10-CM | POA: Diagnosis present

## 2017-04-25 DIAGNOSIS — Z91048 Other nonmedicinal substance allergy status: Secondary | ICD-10-CM

## 2017-04-25 DIAGNOSIS — R600 Localized edema: Secondary | ICD-10-CM | POA: Diagnosis present

## 2017-04-25 DIAGNOSIS — E2740 Unspecified adrenocortical insufficiency: Secondary | ICD-10-CM | POA: Diagnosis present

## 2017-04-25 DIAGNOSIS — Z91013 Allergy to seafood: Secondary | ICD-10-CM | POA: Diagnosis not present

## 2017-04-25 DIAGNOSIS — E872 Acidosis: Secondary | ICD-10-CM | POA: Diagnosis present

## 2017-04-25 DIAGNOSIS — M05721 Rheumatoid arthritis with rheumatoid factor of right elbow without organ or systems involvement: Secondary | ICD-10-CM | POA: Diagnosis not present

## 2017-04-25 DIAGNOSIS — A419 Sepsis, unspecified organism: Secondary | ICD-10-CM | POA: Diagnosis not present

## 2017-04-25 DIAGNOSIS — M810 Age-related osteoporosis without current pathological fracture: Secondary | ICD-10-CM | POA: Diagnosis present

## 2017-04-25 DIAGNOSIS — J189 Pneumonia, unspecified organism: Secondary | ICD-10-CM | POA: Diagnosis not present

## 2017-04-25 DIAGNOSIS — Z888 Allergy status to other drugs, medicaments and biological substances status: Secondary | ICD-10-CM

## 2017-04-25 DIAGNOSIS — Z882 Allergy status to sulfonamides status: Secondary | ICD-10-CM | POA: Diagnosis not present

## 2017-04-25 DIAGNOSIS — M797 Fibromyalgia: Secondary | ICD-10-CM | POA: Diagnosis present

## 2017-04-25 DIAGNOSIS — Z87891 Personal history of nicotine dependence: Secondary | ICD-10-CM | POA: Diagnosis not present

## 2017-04-25 DIAGNOSIS — Z981 Arthrodesis status: Secondary | ICD-10-CM

## 2017-04-25 DIAGNOSIS — M069 Rheumatoid arthritis, unspecified: Secondary | ICD-10-CM | POA: Diagnosis present

## 2017-04-25 DIAGNOSIS — G9341 Metabolic encephalopathy: Secondary | ICD-10-CM | POA: Diagnosis present

## 2017-04-25 DIAGNOSIS — Z881 Allergy status to other antibiotic agents status: Secondary | ICD-10-CM

## 2017-04-25 DIAGNOSIS — K219 Gastro-esophageal reflux disease without esophagitis: Secondary | ICD-10-CM | POA: Diagnosis present

## 2017-04-25 DIAGNOSIS — Z880 Allergy status to penicillin: Secondary | ICD-10-CM | POA: Diagnosis not present

## 2017-04-25 DIAGNOSIS — Z79899 Other long term (current) drug therapy: Secondary | ICD-10-CM | POA: Diagnosis not present

## 2017-04-25 DIAGNOSIS — M170 Bilateral primary osteoarthritis of knee: Secondary | ICD-10-CM | POA: Diagnosis present

## 2017-04-25 DIAGNOSIS — M05722 Rheumatoid arthritis with rheumatoid factor of left elbow without organ or systems involvement: Secondary | ICD-10-CM | POA: Diagnosis not present

## 2017-04-25 LAB — COMPREHENSIVE METABOLIC PANEL
ALT: 23 U/L (ref 14–54)
AST: 45 U/L — ABNORMAL HIGH (ref 15–41)
Albumin: 4 g/dL (ref 3.5–5.0)
Alkaline Phosphatase: 60 U/L (ref 38–126)
Anion gap: 14 (ref 5–15)
BUN: 14 mg/dL (ref 6–20)
CO2: 27 mmol/L (ref 22–32)
Calcium: 9.1 mg/dL (ref 8.9–10.3)
Chloride: 96 mmol/L — ABNORMAL LOW (ref 101–111)
Creatinine, Ser: 1.02 mg/dL — ABNORMAL HIGH (ref 0.44–1.00)
GFR calc Af Amer: 58 mL/min — ABNORMAL LOW (ref >60)
GFR calc non Af Amer: 50 mL/min — ABNORMAL LOW (ref >60)
Glucose, Bld: 122 mg/dL — ABNORMAL HIGH (ref 65–99)
Potassium: 2.8 mmol/L — ABNORMAL LOW (ref 3.5–5.1)
Sodium: 137 mmol/L (ref 135–145)
Total Bilirubin: 0.8 mg/dL (ref 0.3–1.2)
Total Protein: 8.2 g/dL — ABNORMAL HIGH (ref 6.5–8.1)

## 2017-04-25 LAB — CBC WITH DIFFERENTIAL/PLATELET
Basophils Absolute: 0 10*3/uL (ref 0.0–0.1)
Basophils Relative: 0 %
Eosinophils Absolute: 0 10*3/uL (ref 0.0–0.7)
Eosinophils Relative: 0 %
HCT: 44.1 % (ref 36.0–46.0)
Hemoglobin: 14.9 g/dL (ref 12.0–15.0)
Lymphocytes Relative: 19 %
Lymphs Abs: 2.1 10*3/uL (ref 0.7–4.0)
MCH: 33.2 pg (ref 26.0–34.0)
MCHC: 33.8 g/dL (ref 30.0–36.0)
MCV: 98.2 fL (ref 78.0–100.0)
Monocytes Absolute: 1.8 10*3/uL — ABNORMAL HIGH (ref 0.1–1.0)
Monocytes Relative: 17 %
Neutro Abs: 7 10*3/uL (ref 1.7–7.7)
Neutrophils Relative %: 64 %
Platelets: 252 10*3/uL (ref 150–400)
RBC: 4.49 MIL/uL (ref 3.87–5.11)
RDW: 15.4 % (ref 11.5–15.5)
WBC: 10.9 10*3/uL — ABNORMAL HIGH (ref 4.0–10.5)

## 2017-04-25 LAB — URINALYSIS, ROUTINE W REFLEX MICROSCOPIC
Bacteria, UA: NONE SEEN
Bilirubin Urine: NEGATIVE
Glucose, UA: NEGATIVE mg/dL
Ketones, ur: 20 mg/dL — AB
Leukocytes, UA: NEGATIVE
Nitrite: NEGATIVE
Protein, ur: >=300 mg/dL — AB
Specific Gravity, Urine: 1.015 (ref 1.005–1.030)
Squamous Epithelial / LPF: NONE SEEN
pH: 6 (ref 5.0–8.0)

## 2017-04-25 LAB — I-STAT CG4 LACTIC ACID, ED: Lactic Acid, Venous: 2.21 mmol/L (ref 0.5–1.9)

## 2017-04-25 MED ORDER — ONDANSETRON HCL 4 MG/2ML IJ SOLN: 4.0000 mg | Freq: Four times a day (QID) | INTRAMUSCULAR | Status: DC | PRN

## 2017-04-25 MED ORDER — VANCOMYCIN HCL IN DEXTROSE 750-5 MG/150ML-% IV SOLN
750.0000 mg | Freq: Two times a day (BID) | INTRAVENOUS | Status: DC
  Administered 2017-04-25 – 2017-04-27 (×4): 750 mg via INTRAVENOUS
  Filled 2017-04-25 (×5): qty 150

## 2017-04-25 MED ORDER — OXYMETAZOLINE HCL 0.05 % NA SOLN
1.0000 | Freq: Two times a day (BID) | NASAL | Status: DC
  Administered 2017-04-26 (×2): 1 via NASAL
  Filled 2017-04-25: qty 15

## 2017-04-25 MED ORDER — ATORVASTATIN CALCIUM 20 MG PO TABS
20.0000 mg | ORAL_TABLET | Freq: Every day | ORAL | Status: DC
  Administered 2017-04-25 – 2017-04-26 (×2): 20 mg via ORAL
  Filled 2017-04-25 (×2): qty 1

## 2017-04-25 MED ORDER — IPRATROPIUM-ALBUTEROL 0.5-2.5 (3) MG/3ML IN SOLN: 3.0000 mL | Freq: Four times a day (QID) | RESPIRATORY_TRACT | Status: DC | PRN

## 2017-04-25 MED ORDER — METHOCARBAMOL 500 MG PO TABS: 500.0000 mg | ORAL_TABLET | Freq: Three times a day (TID) | ORAL | Status: DC | PRN

## 2017-04-25 MED ORDER — LORATADINE 10 MG PO TABS
10.0000 mg | ORAL_TABLET | Freq: Every day | ORAL | Status: DC
  Administered 2017-04-25 – 2017-04-27 (×3): 10 mg via ORAL
  Filled 2017-04-25 (×3): qty 1

## 2017-04-25 MED ORDER — VANCOMYCIN HCL IN DEXTROSE 1-5 GM/200ML-% IV SOLN
1000.0000 mg | Freq: Once | INTRAVENOUS | Status: AC
  Administered 2017-04-25: 1000 mg via INTRAVENOUS
  Filled 2017-04-25: qty 200

## 2017-04-25 MED ORDER — LEVOFLOXACIN IN D5W 750 MG/150ML IV SOLN: 750.0000 mg | INTRAVENOUS | Status: DC

## 2017-04-25 MED ORDER — PANTOPRAZOLE SODIUM 40 MG PO TBEC
40.0000 mg | DELAYED_RELEASE_TABLET | Freq: Every day | ORAL | Status: DC
  Administered 2017-04-25 – 2017-04-27 (×3): 40 mg via ORAL
  Filled 2017-04-25 (×3): qty 1

## 2017-04-25 MED ORDER — ACETAMINOPHEN 325 MG PO TABS
650.0000 mg | ORAL_TABLET | Freq: Four times a day (QID) | ORAL | Status: DC | PRN
  Administered 2017-04-26 – 2017-04-27 (×2): 650 mg via ORAL
  Filled 2017-04-25 (×2): qty 2

## 2017-04-25 MED ORDER — FLUTICASONE PROPIONATE 50 MCG/ACT NA SUSP
2.0000 | Freq: Every day | NASAL | Status: DC
  Administered 2017-04-25 – 2017-04-27 (×3): 2 via NASAL
  Filled 2017-04-25: qty 16

## 2017-04-25 MED ORDER — SODIUM CHLORIDE 0.9 % IV SOLN
INTRAVENOUS | Status: AC
  Administered 2017-04-25 – 2017-04-26 (×2): via INTRAVENOUS

## 2017-04-25 MED ORDER — BENZONATATE 100 MG PO CAPS
200.0000 mg | ORAL_CAPSULE | Freq: Three times a day (TID) | ORAL | Status: DC | PRN
  Administered 2017-04-26 – 2017-04-27 (×3): 200 mg via ORAL
  Filled 2017-04-25 (×3): qty 2

## 2017-04-25 MED ORDER — POTASSIUM CHLORIDE CRYS ER 20 MEQ PO TBCR
40.0000 meq | EXTENDED_RELEASE_TABLET | Freq: Once | ORAL | Status: AC
Start: 2017-04-25 — End: 2017-04-25
  Administered 2017-04-25: 40 meq via ORAL
  Filled 2017-04-25: qty 2

## 2017-04-25 MED ORDER — FOLIC ACID 1 MG PO TABS
1.0000 mg | ORAL_TABLET | Freq: Every day | ORAL | Status: DC
  Administered 2017-04-25 – 2017-04-27 (×3): 1 mg via ORAL
  Filled 2017-04-25 (×3): qty 1

## 2017-04-25 MED ORDER — POTASSIUM CHLORIDE CRYS ER 20 MEQ PO TBCR
40.0000 meq | EXTENDED_RELEASE_TABLET | Freq: Once | ORAL | Status: AC
  Administered 2017-04-25: 40 meq via ORAL
  Filled 2017-04-25: qty 2

## 2017-04-25 MED ORDER — BIOTIN 10 MG PO TABS: 10.0000 mg | ORAL_TABLET | Freq: Every day | ORAL | Status: DC

## 2017-04-25 MED ORDER — ACETAMINOPHEN 325 MG PO TABS
650.0000 mg | ORAL_TABLET | Freq: Once | ORAL | Status: AC
  Administered 2017-04-25: 650 mg via ORAL
  Filled 2017-04-25: qty 2

## 2017-04-25 MED ORDER — VITAMIN D3 25 MCG (1000 UNIT) PO TABS
5000.0000 [IU] | ORAL_TABLET | Freq: Every day | ORAL | Status: DC
  Administered 2017-04-25 – 2017-04-27 (×3): 5000 [IU] via ORAL
  Filled 2017-04-25 (×3): qty 5

## 2017-04-25 MED ORDER — LEVOFLOXACIN IN D5W 750 MG/150ML IV SOLN
750.0000 mg | Freq: Once | INTRAVENOUS | Status: AC
  Administered 2017-04-25: 750 mg via INTRAVENOUS
  Filled 2017-04-25: qty 150

## 2017-04-25 MED ORDER — ENOXAPARIN SODIUM 40 MG/0.4ML ~~LOC~~ SOLN
40.0000 mg | SUBCUTANEOUS | Status: DC
  Administered 2017-04-25 – 2017-04-26 (×2): 40 mg via SUBCUTANEOUS
  Filled 2017-04-25 (×2): qty 0.4

## 2017-04-25 MED ORDER — FUROSEMIDE 40 MG PO TABS
60.0000 mg | ORAL_TABLET | Freq: Every day | ORAL | Status: DC
  Administered 2017-04-26: 60 mg via ORAL
  Administered 2017-04-27: 11:00:00 via ORAL
  Filled 2017-04-25 (×2): qty 2

## 2017-04-25 MED ORDER — VITAMIN D3 125 MCG (5000 UT) PO CAPS: 5000.0000 [IU] | ORAL_CAPSULE | Freq: Every day | ORAL | Status: DC

## 2017-04-25 MED ORDER — DICLOFENAC SODIUM 1 % TD GEL: 2.0000 g | Freq: Four times a day (QID) | TRANSDERMAL | Status: DC | PRN

## 2017-04-25 MED ORDER — GUAIFENESIN ER 600 MG PO TB12
600.0000 mg | ORAL_TABLET | Freq: Two times a day (BID) | ORAL | Status: DC
  Administered 2017-04-25 – 2017-04-27 (×4): 600 mg via ORAL
  Filled 2017-04-25 (×5): qty 1

## 2017-04-25 MED ORDER — PREDNISONE 20 MG PO TABS
10.0000 mg | ORAL_TABLET | Freq: Every day | ORAL | Status: DC
  Administered 2017-04-25 – 2017-04-27 (×3): 10 mg via ORAL
  Filled 2017-04-25 (×3): qty 1

## 2017-04-25 MED ORDER — TRAMADOL HCL 50 MG PO TABS
50.0000 mg | ORAL_TABLET | Freq: Three times a day (TID) | ORAL | Status: DC
  Administered 2017-04-25 – 2017-04-27 (×6): 50 mg via ORAL
  Filled 2017-04-25 (×6): qty 1

## 2017-04-25 NOTE — Progress Notes (Signed)
PHARMACIST - PHYSICIAN ORDER COMMUNICATION  CONCERNING: P&T Medication Policy on Herbal Medications  DESCRIPTION:  This patient's order for:  Biotin has been noted.  This product(s) is classified as an "herbal" or natural product. Due to a lack of definitive safety studies or FDA approval, nonstandard manufacturing practices, plus the potential risk of unknown drug-drug interactions while on inpatient medications, the Pharmacy and Therapeutics Committee does not permit the use of "herbal" or natural products of this type within [facility].   ACTION TAKEN: The pharmacy department is unable to verify this order at this time and your patient has been informed of this safety policy. Please reevaluate patient's clinical condition at discharge and address if the herbal or natural product(s) should be resumed at that time.  Romeo Rabon, PharmD, pager [phone]. 04/25/2017,2:20 PM.

## 2017-04-25 NOTE — ED Notes (Signed)
Bed: WA15 Expected date:  Expected time:  Means of arrival:  Comments: EMS-fever 

## 2017-04-25 NOTE — ED Provider Notes (Signed)
[facility] DEPT Provider Note   CSN: [id number] Arrival date & time: 04/25/17  1105     History   Chief Complaint Chief Complaint  Patient presents with  . Weakness  . Altered Mental Status    HPI Eileen Wilkerson is a 83 y.o. female.  83 year old female presents with altered mental status. Was seen by her doctor yesterday for cough and congestion and started on doxycycline for presumptive pneumonia. According to EMS, patient's more confused today. She is normally alert and oriented 4. Able to do her ADLs. Patient states that she has had some vomiting as well as diarrhea. Is also noted dyspnea as well as productive cough of green sputum. Does note dysuria as well 2. Denies abdominal or chest pain. EMS called and patient transported here      Past Medical History:  Diagnosis Date  . Adrenal failure ([place])   . Arthritis   . Cataract   . Fibromyalgia 2008  . Osteoporosis     Patient Active Problem List   Diagnosis Date Noted  . Nondisplaced fracture of fifth right metatarsal bone with routine healing 04/12/2017  . Fibromyalgia 12/18/2016  . DJD (degenerative joint disease), cervical 12/18/2016  . Spondylosis of lumbar region without myelopathy or radiculopathy 12/18/2016  . Other fatigue 12/18/2016  . Trigger finger, right middle finger 12/18/2016  . Primary osteoarthritis of both hands 12/18/2016  . Primary osteoarthritis of both feet 12/18/2016  . Primary osteoarthritis of both knees 12/18/2016  . History of gastroesophageal reflux (GERD) 12/18/2016  . Age-related osteoporosis without current pathological fracture 12/18/2016  . High risk medication use 12/18/2016  . Fever 12/28/2015  . Cough 12/28/2015  . Dehydration 12/28/2015  . Rheumatoid arthritis ([facility]) 12/28/2015  . Long term current use of systemic steroids 12/28/2015  . Lymphocytosis 11/13/2012    Past Surgical History:  Procedure Laterality Date  . BACK SURGERY    . carpel tunnel release  2005     bilateral wrists  . CATARACT EXTRACTION, BILATERAL  2004  . CERVICAL FUSION  2011,2010,2008   c 3--4,4-5, lower back  . HEEL SPUR SURGERY  2004  . rotater cuff  [id number]  . TONSILLECTOMY AND ADENOIDECTOMY  1947    OB History    No data available       Home Medications    Prior to Admission medications   Medication Sig Start Date End Date Taking? Authorizing Provider  acetaminophen (TYLENOL) 650 MG CR tablet Take 1,300 mg by mouth 2 (two) times daily.    [provider]  atorvastatin (LIPITOR) 20 MG tablet Take 20 mg by mouth At bedtime. 09/12/12   [provider]  Biotin (BIOTIN MAXIMUM STRENGTH) 10 MG TABS Take 10 mg by mouth daily.    [provider]  Cholecalciferol (VITAMIN D3) 5000 units CAPS Take 5,000 Units by mouth daily.    [provider]  diclofenac sodium (VOLTAREN) 1 % GEL APPLY 3 GRAMS TO 3 LARGEST JOINTS THREE TIMES DAILY AS NEEDED 03/02/17   Bo Merino, MD  fluticasone Asencion Islam) 50 MCG/ACT nasal spray  09/14/16   [provider]  folic acid (FOLVITE) 1 MG tablet TAKE TWO TABLETS BY MOUTH ONCE DAILY 01/18/17   Panwala, Naitik, PA-C  furosemide (LASIX) 40 MG tablet Take 60 mg by mouth Daily. 09/10/12   [provider]  hydroxychloroquine (PLAQUENIL) 200 MG tablet TAKE ONE TABLET BY MOUTH ONCE DAILY 02/02/17   Bo Merino, MD  methocarbamol (ROBAXIN) 500 MG tablet TAKE ONE TABLET BY  MOUTH EVERY 6 TO 8 HOURS AS NEEDED FOR PAIN 03/08/17   Panwala, Naitik, PA-C  Methotrexate, PF, (OTREXUP) 15 MG/0.4ML SOAJ INJECT ONE PEN (15 MG) SUBCUTANEOUSLY ONCE EVERY WEEK. STORE AT ROOM TEMPERATURE BETWEEN 68 - 77 DEGREES F. 04/19/17   Panwala, Naitik, PA-C  predniSONE (DELTASONE) 5 MG tablet Take 1 tablet (5 mg total) by mouth daily with breakfast. 12/19/16   Panwala, Naitik, PA-C  PREVACID 24HR 15 MG capsule Take 15 mg by mouth Daily. 10/07/12   [provider]  SAVELLA 50 MG TABS Take 50 mg by mouth 2 (two) times  daily.  10/18/12   [provider]  traMADol (ULTRAM) 50 MG tablet TAKE 1 TABLET BY MOUTH THREE TIMES DAILY 04/19/17   Panwala, Naitik, PA-C  Zoledronic Acid (RECLAST IV) Inject into the vein. Once a year    [provider]    Family History Family History  Problem Relation Age of Onset  . Breast cancer Mother   . Diabetes Father     Social History Social History  Substance Use Topics  . Smoking status: Former Research scientist (life sciences)  . Smokeless tobacco: Never Used  . Alcohol use No     Allergies   Adhesive [tape]; Celebrex [celecoxib]; Cymbalta [duloxetine hcl]; Gabitril [tiagabine]; Keflex [cephalexin]; Lyrica [pregabalin]; Neurontin [gabapentin]; Nexium [esomeprazole]; Nsaids; Penicillins; Shrimp [shellfish allergy]; and Sulfa antibiotics   Review of Systems Review of Systems  All other systems reviewed and are negative.    Physical Exam Updated Vital Signs BP (!) 141/84 (BP Location: Left Arm)   Pulse (!) 113   Temp (!) 103.2 F (39.6 C) (Rectal)   Resp 16   SpO2 92%   Physical Exam  Constitutional: She is oriented to person, place, and time. She appears well-developed and well-nourished. She appears lethargic.  Non-toxic appearance. No distress.  HENT:  Head: Normocephalic and atraumatic.  Eyes: Conjunctivae, EOM and lids are normal. Pupils are equal, round, and reactive to light.  Neck: Normal range of motion. Neck supple. No tracheal deviation present. No thyroid mass present.  Cardiovascular: Normal rate, regular rhythm and normal heart sounds.  Exam reveals no gallop.   No murmur heard. Pulmonary/Chest: No stridor. Tachypnea noted. She is in respiratory distress. She has decreased breath sounds in the right lower field and the left lower field. She has no wheezes. She has rhonchi in the right lower field and the left lower field. She has no rales.  Abdominal: Soft. Normal appearance and bowel sounds are normal. She exhibits no distension. There is no  tenderness. There is no rebound and no CVA tenderness.  Musculoskeletal: Normal range of motion. She exhibits no edema or tenderness.  Neurological: She is oriented to person, place, and time. She appears lethargic. She displays no tremor. No cranial nerve deficit or sensory deficit. GCS eye subscore is 4. GCS verbal subscore is 5. GCS motor subscore is 6.  Skin: Skin is warm and dry. No abrasion and no rash noted.  Psychiatric: Her affect is blunt. Her speech is delayed. She is slowed. She is not actively hallucinating. She is inattentive.  Nursing note and vitals reviewed.    ED Treatments / Results  Labs (all labs ordered are listed, but only abnormal results are displayed) Labs Reviewed  CULTURE, BLOOD (ROUTINE X 2)  CULTURE, BLOOD (ROUTINE X 2)  COMPREHENSIVE METABOLIC PANEL  CBC WITH DIFFERENTIAL/PLATELET  URINALYSIS, ROUTINE W REFLEX MICROSCOPIC  I-STAT CG4 LACTIC ACID, ED    EKG  EKG Interpretation None  Radiology No results found.  Procedures Procedures (including critical care time)  Medications Ordered in ED Medications - No data to display   Initial Impression / Assessment and Plan / ED Course  I have reviewed the triage vital signs and the nursing notes.  Pertinent labs & imaging results that were available during my care of the patient were reviewed by me and considered in my medical decision making (see chart for details).     Patient started on IV antibiotics for her apparent pneumonia. She had been medicated with Tylenol for her temperature. Patient's lactate was noted. Blood cultures are pending. Discussed with the hospitalist will admit the patient  Final Clinical Impressions(s) / ED Diagnoses   Final diagnoses:  None    New Prescriptions New Prescriptions   No medications on file     Lacretia Leigh, MD 04/25/17 1343

## 2017-04-25 NOTE — ED Notes (Signed)
Hospitalist at bedside 

## 2017-04-25 NOTE — ED Notes (Signed)
MD Zenia Resides and RN Frederico Hamman made aware of elevated lactic

## 2017-04-25 NOTE — Progress Notes (Signed)
Pharmacy Antibiotic Note  Eileen Wilkerson is a 83 y.o. female admitted on 04/25/2017 with pneumonia. Patient started on doxycyline by PCP 5/15.  Pharmacy has been consulted for levofloxacin and vancomycin dosing. Patient hypotensive, vancomycin appropriate in CAP if septic shock and/or needs mechanical ventilation - Note patient with rash to PCN and N/V to cephalosporin.  Suspect cephalosporin would be tolerated if needed.   Today, 04/25/2017  Tm 103.2  Renal: slightly elevated  LA elevated  Plan:  Levofloxacin 750mg  IV q48h for CrCl < 50 ml/min  Vancomycin 1gm IV x 1 in ED then 750mg  IV q12h  Follow renal function  Trough at steady state  De-escalate as microbiology results return  Suggest MRSA PCR, respiratory virus panel, and strep urine Ag.   Height: 5\' 4"  (162.6 cm) Weight: 175 lb (79.4 kg) IBW/kg (Calculated) : 54.7  Temp (24hrs), Avg:103.2 F (39.6 C), Min:103.2 F (39.6 C), Max:103.2 F (39.6 C)   Recent Labs Lab 04/19/17 1219 04/25/17 1124 04/25/17 1143 04/25/17 1206  WBC 9.1 10.9*  --   --   CREATININE 1.12*  --   --  1.02*  LATICACIDVEN  --   --  2.21*  --     Estimated Creatinine Clearance: 43.4 mL/min (A) (by C-G formula based on SCr of 1.02 mg/dL (H)).    Allergies  Allergen Reactions  . Adhesive [Tape] Rash  . Celebrex [Celecoxib] Hives  . Cymbalta [Duloxetine Hcl] Swelling  . Gabitril [Tiagabine] Swelling  . Keflex [Cephalexin] Nausea And Vomiting  . Lyrica [Pregabalin] Swelling  . Neurontin [Gabapentin] Swelling  . Nexium [Esomeprazole] Rash  . Nsaids Rash  . Penicillins Rash    Injection site reaction Has patient had a PCN reaction causing immediate rash, facial/tongue/throat swelling, SOB or lightheadedness with hypotension: No Has patient had a PCN reaction causing severe rash involving mucus membranes or skin necrosis: No Has patient had a PCN reaction that required hospitalization No Has patient had a PCN reaction occurring  within the last 10 years: No If all of the above answers are "NO", then may proceed with Cephalosporin use.   . Shrimp [Shellfish Allergy] Anaphylaxis  . Sulfa Antibiotics Other (See Comments)    "deathly ill"/nausea    Antimicrobials this admission: 5/16 >> vancomycin  5/16 >> levofloxacin  Dose adjustments this admission:  Microbiology results: 5/16 BCx:   Thank you for allowing pharmacy to be a part of this patient's care.  Doreene Eland, PharmD, BCPS.   Pager: [phone] 04/25/2017 2:05 PM

## 2017-04-25 NOTE — ED Triage Notes (Signed)
Per EMS, patient from home, c/o non productive cough and fatigue since Sunday. Seen by PCP yesterday and prescribed abx. Reports today she is so weak she cannot stand. A&Ox1. Baseline A&Ox4.  O2 95% 2l HR 114 BP 144/80 CBG 144

## 2017-04-25 NOTE — ED Notes (Signed)
ED Provider at bedside. 

## 2017-04-25 NOTE — H&P (Signed)
HISTORY AND PHYSICAL       PATIENT DETAILS Name: Eileen Wilkerson Age: 83 y.o. Sex: female Date of Birth: [birth year] Admit Date: 04/25/2017 [id number], Aaron Edelman, MD   Patient coming from: Home   CHIEF COMPLAINT:  Confusion this morning Cough/fever-for the past 2-3 days.  HPI: Eileen Wilkerson is a 83 y.o. female with medical history significant of rheumatoid arthritis (on chronic prednisone, methotrexate and Plaquenil), chronic lower extremity edema on Lasix-brought to the ED for evaluation of confusion, cough. Note, patient is not a best historian-most of this history is obtained after speaking with the patient's daughter at bedside. Apparently this past Sunday, patient's spouse started having URI symptoms, subsequent to this the patient then started having similar symptoms. She initially had nasal congestion and bilateral maxillary sinus fullness and mild tenderness. This was followed by sore throat. Over the past few days, the symptoms have improved and she started having cough-which is mostly productive with yellow issue white phlegm. Yesterday, her daughter took her to a local urgent care, she was started on doxycycline. This morning, her home health aide found her to be very confused and weak, she was then referred to the emergency room.  During my evaluation in the emergency room, patient was much more awake and alert-and was actually able to participate in the history taking process. She denied any headache, chest pain or shortness of breath. She appeared very weak but otherwise stable.  There is no history of headaches, nausea, vomiting or diarrhea.  ED Course:  In the emergency room-patientwasfoundtohaveafeverof103Frectally, mild leukocytosis at 10.9, hypokalemia. Chest x-ray was suggestive of pneumonia. Patient was given empiric vancomycin and Levaquin, I was subsequently asked to admit this patient for further evaluation and treatment.  Note: Lives at:  Home Mobility: Independent Chronic Indwelling Foley:no   REVIEW OF SYSTEMS:  Constitutional:   No  weight loss, night sweat  HEENT:    No headaches, Dysphagia,Tooth/dental problems No sneezing, itching, ear ache,  Cardio-vascular: No chest pain,Orthopnea, PND,lower extremity edema, anasarca, palpitations  GI:  No heartburn, indigestion, abdominal pain, nausea, vomiting, diarrhea, melena or hematochezia  Resp: No shortness of breath, hemoptysis,plueritic chest pain.   Skin:  No rash or lesions.  GU:  No dysuria, change in color of urine, no urgency or frequency.  No flank pain.  Musculoskeletal: No joint pain or swelling.  No decreased range of motion.  No back pain.  Endocrine: No heat intolerance, no cold intolerance, no polyuria, no polydipsia  Psych: No change in mood or affect. No depression or anxiety.     ALLERGIES:   Allergies  Allergen Reactions  . Adhesive [Tape] Rash  . Celebrex [Celecoxib] Hives  . Cymbalta [Duloxetine Hcl] Swelling  . Gabitril [Tiagabine] Swelling  . Keflex [Cephalexin] Nausea And Vomiting  . Lyrica [Pregabalin] Swelling  . Neurontin [Gabapentin] Swelling  . Nexium [Esomeprazole] Rash  . Nsaids Rash  . Penicillins Rash    Injection site reaction Has patient had a PCN reaction causing immediate rash, facial/tongue/throat swelling, SOB or lightheadedness with hypotension: No Has patient had a PCN reaction causing severe rash involving mucus membranes or skin necrosis: No Has patient had a PCN reaction that required hospitalization No Has patient had a PCN reaction occurring within the last 10 years: No If all of the above answers are "NO", then may proceed with Cephalosporin use.   . Shrimp [Shellfish Allergy] Anaphylaxis  . Sulfa Antibiotics Other (See Comments)    "deathly ill"/nausea  PAST MEDICAL HISTORY: Past Medical History:  Diagnosis Date  . Adrenal failure ([place])   . Arthritis   . Cataract   . Fibromyalgia 2008   . Osteoporosis     PAST SURGICAL HISTORY: Past Surgical History:  Procedure Laterality Date  . BACK SURGERY    . carpel tunnel release  2005   bilateral wrists  . CATARACT EXTRACTION, BILATERAL  2004  . CERVICAL FUSION  2011,2010,2008   c 3--4,4-5, lower back  . HEEL SPUR SURGERY  2004  . rotater cuff  [id number]  . TONSILLECTOMY AND ADENOIDECTOMY  1947    MEDICATIONS AT HOME: Prior to Admission medications   Medication Sig Start Date End Date Taking? Authorizing Provider  acetaminophen (TYLENOL) 650 MG CR tablet Take 1,300 mg by mouth 2 (two) times daily.   Yes [provider]  atorvastatin (LIPITOR) 20 MG tablet Take 20 mg by mouth At bedtime. 09/12/12  Yes [provider]  Biotin (BIOTIN MAXIMUM STRENGTH) 10 MG TABS Take 10 mg by mouth daily.   Yes [provider]  Cholecalciferol (VITAMIN D3) 5000 units CAPS Take 5,000 Units by mouth daily.   Yes [provider]  diclofenac sodium (VOLTAREN) 1 % GEL APPLY 3 GRAMS TO 3 LARGEST JOINTS THREE TIMES DAILY AS NEEDED 03/02/17  Yes Deveshwar, Abel Presto, MD  fluticasone (FLONASE) 50 MCG/ACT nasal spray  09/14/16  Yes [provider]  folic acid (FOLVITE) 1 MG tablet TAKE TWO TABLETS BY MOUTH ONCE DAILY 01/18/17  Yes Panwala, Naitik, PA-C  furosemide (LASIX) 40 MG tablet Take 60 mg by mouth Daily. 09/10/12  Yes [provider]  hydroxychloroquine (PLAQUENIL) 200 MG tablet TAKE ONE TABLET BY MOUTH ONCE DAILY 02/02/17  Yes Deveshwar, Abel Presto, MD  methocarbamol (ROBAXIN) 500 MG tablet TAKE ONE TABLET BY MOUTH EVERY 6 TO 8 HOURS AS NEEDED FOR PAIN 03/08/17  Yes Panwala, Naitik, PA-C  Methotrexate, PF, (OTREXUP) 15 MG/0.4ML SOAJ INJECT ONE PEN (15 MG) SUBCUTANEOUSLY ONCE EVERY WEEK. STORE AT ROOM TEMPERATURE BETWEEN 68 - 77 DEGREES F. 04/19/17  Yes Panwala, Naitik, PA-C  predniSONE (DELTASONE) 5 MG tablet Take 1 tablet (5 mg total) by mouth daily with breakfast. 12/19/16  Yes Panwala, Naitik, PA-C   PRESCRIPTION MEDICATION Take 1 tablet by mouth 2 (two) times daily. Antibiotic started on 04/24/17   Yes [provider]  PREVACID 24HR 15 MG capsule Take 15 mg by mouth Daily. 10/07/12  Yes [provider]  SAVELLA 50 MG TABS Take 50 mg by mouth 2 (two) times daily.  10/18/12  Yes [provider]  traMADol (ULTRAM) 50 MG tablet TAKE 1 TABLET BY MOUTH THREE TIMES DAILY 04/19/17  Yes Panwala, Naitik, PA-C  Zoledronic Acid (RECLAST IV) Inject into the vein. Once a year   Yes [provider]    FAMILY HISTORY: Family History  Problem Relation Age of Onset  . Breast cancer Mother   . Diabetes Father      SOCIAL HISTORY:  reports that she has quit smoking. She has never used smokeless tobacco. She reports that she does not drink alcohol or use drugs.  PHYSICAL EXAM: Blood pressure (!) 97/55, pulse (!) 108, temperature (!) 101.5 F (38.6 C), temperature source Rectal, resp. rate 20, height 5\' 4"  (1.626 m), weight 79.4 kg (175 lb), SpO2 93 %.  General appearance :Awake, alert, not in any distress. Appears chronically sick appearing and weak. Eyes:, pupils equally reactive to light and accomodation,no scleral icterus.Pink conjunctiva HEENT: Atraumatic and Normocephalic Throat: Erythema  in the posterior pharynx area-no exudate. Tonsils not enlarged. Neck: supple, no JVD. No cervical lymphadenopathy. No thyromegaly Resp:Good air entry bilaterally, few bibasilar rales CVS: S1 S2 regular, no murmurs.  GI: Bowel sounds present, Non tender and not distended with no gaurding, rigidity or rebound.No organomegaly Extremities: B/L Lower Ext shows trace edema, both legs are warm to touch Neurology:  speech clear,Non focal, sensation is grossly intact. Psychiatric: Normal judgment and insight. Alert and oriented x 3. Normal mood. Musculoskeletal:gait appears to be normal.No digital cyanosis Skin:No Rash, warm and dry Wounds:N/A  LABS ON ADMISSION:  I have  personally reviewed following labs and imaging studies  CBC:  Recent Labs Lab 04/19/17 1219 04/25/17 1124  WBC 9.1 10.9*  NEUTROABS 5,915 7.0  HGB 12.3 14.9  HCT 37.6 44.1  MCV 100.0 98.2  PLT 293 458    Basic Metabolic Panel:  Recent Labs Lab 04/19/17 1219 04/25/17 1206  NA 144 137  K 3.3* 2.8*  CL 101 96*  CO2 32* 27  GLUCOSE 95 122*  BUN 18 14  CREATININE 1.12* 1.02*  CALCIUM 9.8 9.1    GFR: Estimated Creatinine Clearance: 43.4 mL/min (A) (by C-G formula based on SCr of 1.02 mg/dL (H)).  Liver Function Tests:  Recent Labs Lab 04/19/17 1219 04/25/17 1206  AST 19 45*  ALT 14 23  ALKPHOS 56 60  BILITOT 0.5 0.8  PROT 6.7 8.2*  ALBUMIN 3.9 4.0   No results for input(s): LIPASE, AMYLASE in the last 168 hours. No results for input(s): AMMONIA in the last 168 hours.  Coagulation Profile: No results for input(s): INR, PROTIME in the last 168 hours.  Cardiac Enzymes: No results for input(s): CKTOTAL, CKMB, CKMBINDEX, TROPONINI in the last 168 hours.  BNP (last 3 results) No results for input(s): PROBNP in the last 8760 hours.  HbA1C: No results for input(s): HGBA1C in the last 72 hours.  CBG: No results for input(s): GLUCAP in the last 168 hours.  Lipid Profile: No results for input(s): CHOL, HDL, LDLCALC, TRIG, CHOLHDL, LDLDIRECT in the last 72 hours.  Thyroid Function Tests: No results for input(s): TSH, T4TOTAL, FREET4, T3FREE, THYROIDAB in the last 72 hours.  Anemia Panel: No results for input(s): VITAMINB12, FOLATE, FERRITIN, TIBC, IRON, RETICCTPCT in the last 72 hours.  Urine analysis:    Component Value Date/Time   COLORURINE YELLOW 04/25/2017 1124   APPEARANCEUR HAZY (A) 04/25/2017 1124   LABSPEC 1.015 04/25/2017 1124   PHURINE 6.0 04/25/2017 1124   GLUCOSEU NEGATIVE 04/25/2017 1124   HGBUR LARGE (A) 04/25/2017 1124   BILIRUBINUR NEGATIVE 04/25/2017 1124   KETONESUR 20 (A) 04/25/2017 1124   PROTEINUR >=300 (A) 04/25/2017 1124    UROBILINOGEN 0.2 04/12/2010 1121   NITRITE NEGATIVE 04/25/2017 1124   LEUKOCYTESUR NEGATIVE 04/25/2017 1124    Sepsis Labs: Lactic Acid, Venous    Component Value Date/Time   LATICACIDVEN 2.21 (HH) 04/25/2017 1143     Microbiology: No results found for this or any previous visit (from the past 240 hour(s)).    RADIOLOGIC STUDIES ON ADMISSION: Dg Chest Port 1 View  Result Date: 04/25/2017 CLINICAL DATA:  Nonproductive cough.  Fatigue. EXAM: PORTABLE CHEST 1 VIEW COMPARISON:  01/23/2017 . FINDINGS: Mediastinum and hilar structures normal. Heart size normal. Low lung volumes with mild bibasilar atelectasis. Mild bibasilar infiltrates cannot be excluded. Tiny left pleural effusion cannot be excluded. No pneumothorax. No acute bony abnormality . IMPRESSION: Low lung volumes with mild bibasilar atelectasis. Mild bibasilar infiltrates cannot be excluded.  Small left pleural effusion cannot be excluded. Electronically Signed   By: Marcello Moores  Register   On: 04/25/2017 11:48    I have personally reviewed images of chest xray   ASSESSMENT AND PLAN: Sepsis likely secondary to pneumonia: Although no clear-cut pneumonia seen on x-ray-no symptoms are very suggestive of pneumonia. Patient is immunocompromised (on chronic prednisone and methotrexate). Her husband has had similar symptoms. We will continue empiric vancomycin and levofloxacin pending culture data. We will double her dose of prednisone for the next few days-if clinical improvement continues, suspect she could be transitioned back to her usual dosing in a day or so. We will check a respiratory virus panel. She will be monitored closely on a telemetry unit.  Acute metabolic encephalopathy: Likely secondary to above, seems to have resolved following initiation of antibiotics and other supportive care in the emergency room  Hypokalemia: Suspect secondary to Lasix use, replete and recheck tomorrow morning.  Mild acute kidney injury: Likely  hemodynamically mediated-gently hydrate and recheck electrolytes tomorrow morning.  History of rheumatoid arthritis: Hold methotrexate and Plaquenil for now- double her dose of prednisone as noted above.  Chronic lower extremity edema: Unsure what the etiology is-patient is maintained on Lasix. Currently being hydrated with IV fluids-please assess volume status closely over the hospital course. Suspect Lasix could be resumed tomorrow if clinical improvement continues.  Generalized weakness: Likely secondary to acute illness-she is nonfocal on exam-PT evaluation has been ordered.  Further plan will depend as patient's clinical course evolves and further radiologic and laboratory data become available. Patient will be monitored closely.  Above noted plan was discussed with patient/daughter face to face at bedside, they were in agreement.   CONSULTS: None  DVT Prophylaxis: Prophylactic Lovenox   Code Status: Full Code  Disposition Plan:  Discharge back home possibly in 2-3 days, depending on clinical course  Admission status: Inpatient  going to tele  The medical decision making on this patient was of high complexity and the patient is at high risk for clinical deterioration, therefore this is a level 3 visit.  Total time spent  55 minutes.Greater than 50% of this time was spent in counseling, explanation of diagnosis, planning of further management, and coordination of care.  Oren Binet [facility] Pager [phone]  If 7PM-7AM, please contact night-coverage [email or web address] Password [email or web address] 04/25/2017, 2:13 PM

## 2017-04-25 NOTE — ED Notes (Signed)
Pt's family requesting an update from MD. MD made aware

## 2017-04-25 NOTE — ED Notes (Signed)
First set of blood cultures sent to lab.

## 2017-04-25 NOTE — ED Notes (Signed)
Second blood culture sent to lab.

## 2017-04-25 NOTE — ED Notes (Signed)
Patient given ginger ale. 

## 2017-04-26 LAB — RESPIRATORY PANEL BY PCR

## 2017-04-26 LAB — MRSA PCR SCREENING: MRSA by PCR: NEGATIVE

## 2017-04-26 LAB — PROCALCITONIN: Procalcitonin: 0.24 ng/mL

## 2017-04-26 LAB — HIV ANTIBODY (ROUTINE TESTING W REFLEX): HIV Screen 4th Generation wRfx: NONREACTIVE

## 2017-04-26 LAB — LACTIC ACID, PLASMA
Lactic Acid, Venous: 2.1 mmol/L (ref 0.5–1.9)
Lactic Acid, Venous: 1.6 mmol/L (ref 0.5–1.9)

## 2017-04-26 LAB — LEGIONELLA PNEUMOPHILA SEROGP 1 UR AG: L. pneumophila Serogp 1 Ur Ag: NEGATIVE

## 2017-04-26 LAB — STREP PNEUMONIAE URINARY ANTIGEN: Strep Pneumo Urinary Antigen: NEGATIVE

## 2017-04-26 MED ORDER — SODIUM CHLORIDE 0.9 % IV SOLN
INTRAVENOUS | Status: DC
  Administered 2017-04-26 – 2017-04-27 (×3): via INTRAVENOUS

## 2017-04-26 MED ORDER — DEXTROSE 5 % IV SOLN
1.0000 g | Freq: Two times a day (BID) | INTRAVENOUS | Status: DC
  Administered 2017-04-26: 1 g via INTRAVENOUS
  Filled 2017-04-26 (×4): qty 1

## 2017-04-26 MED ORDER — PHENOL 1.4 % MT LIQD
1.0000 | OROMUCOSAL | Status: DC | PRN
  Administered 2017-04-26 (×2): 1 via OROMUCOSAL
  Filled 2017-04-26: qty 177

## 2017-04-26 NOTE — Progress Notes (Signed)
Pharmacy Antibiotic Note  Eileen Wilkerson is a 83 y.o. female admitted on 04/25/2017 with pneumonia. Patient started on doxycyline by PCP 5/15.  Pharmacy has been consulted for levofloxacin and vancomycin dosing. 5/17 discussed with [facility] antibiotic regimen, levofloxacin changed to cefepime for continued fevers and immunosuppression.  - Note patient with rash to PCN and N/V to cephalosporin.  Suspect cephalosporin would be tolerated if needed.   Today, 04/26/2017  No fevers since 5/16 afternoon  Renal: slightly elevated  LA returned to WNL  Respiratory virus panel pending  Plan:  Cefepime 1gm IV q12h  Vancomycin 750mg  IV q12h  Follow renal function - SCr in am (check at least q48h)  Trough at steady state  De-escalate as microbiology results return  Check MRSA PCR  Await respiratory virus panel results   Height: 5\' 4"  (162.6 cm) Weight: 173 lb 11.6 oz (78.8 kg) IBW/kg (Calculated) : 54.7  Temp (24hrs), Avg:98.6 F (37 C), Min:98.3 F (36.8 C), Max:99 F (37.2 C)   Recent Labs Lab 04/25/17 1124 04/25/17 1143 04/25/17 1206 04/26/17 1032 04/26/17 1315  WBC 10.9*  --   --   --   --   CREATININE  --   --  1.02*  --   --   LATICACIDVEN  --  2.21*  --  2.1* 1.6    Estimated Creatinine Clearance: 43.2 mL/min (A) (by C-G formula based on SCr of 1.02 mg/dL (H)).    Allergies  Allergen Reactions  . Adhesive [Tape] Rash  . Celebrex [Celecoxib] Hives  . Cymbalta [Duloxetine Hcl] Swelling  . Gabitril [Tiagabine] Swelling  . Keflex [Cephalexin] Nausea And Vomiting  . Lyrica [Pregabalin] Swelling  . Neurontin [Gabapentin] Swelling  . Nexium [Esomeprazole] Rash  . Nsaids Rash  . Penicillins Rash    Injection site reaction Has patient had a PCN reaction causing immediate rash, facial/tongue/throat swelling, SOB or lightheadedness with hypotension: No Has patient had a PCN reaction causing severe rash involving mucus membranes or skin necrosis: No Has patient had a  PCN reaction that required hospitalization No Has patient had a PCN reaction occurring within the last 10 years: No If all of the above answers are "NO", then may proceed with Cephalosporin use.   . Shrimp [Shellfish Allergy] Anaphylaxis  . Sulfa Antibiotics Other (See Comments)    "deathly ill"/nausea    Antimicrobials this admission: 5/16 >> vancomycin  5/16 >> levofloxacin >> 5/17 5/17 >> cefepime >>  Dose adjustments this admission:  Microbiology results: 5/16 BCx: NGTD 5/16 Resp virus panel: pending 5/  sputum: not collected 5/16: strep Ag: neg 5/16: legionella Ag: pending  Thank you for allowing pharmacy to be a part of this patient's care.  Doreene Eland, PharmD, BCPS.   Pager: [phone] 04/26/2017 2:02 PM

## 2017-04-26 NOTE — Progress Notes (Signed)
PROGRESS NOTE  Eileen Wilkerson MMH:[id number] DOB: [birth year] DOA: 04/25/2017 PCP: Thressa Sheller, MD  HPI/Recap of past 79 hours:  83 year old female with past mental history of rheumatoid arthritis on prednisone, methotrexate and Plaquenil brought in to the emergency room on 5/15 for several days of confusion and cough as well as husband who had been recently sick with URI symptoms. She was put on doxycycline the day prior. Found to have sepsis (fever plus lactic acidosis) secondary to suspected pneumonia and patient admitted to hospitalist service. Started on IV Levaquin and vancomycin.  This morning, patient feeling a little better. Still productive cough with yellowish sputum and sore throat. Also continues to spike fever.  Assessment/Plan: Principal Problem:   Sepsis ([place]) secondary to pneumonia (given immunosuppression, treating as healthcare associated pneumonia): Patient met criteria for sepsis on admission given fever, lactic acidosis, tachycardia and pneumonia source. Treated with IV fluids and antibiotics. Given minimal change and lactic acidosis plus fever persisting, we'll change Levaquin to cefepime. Active Problems:   Rheumatoid arthritis ([place]): Methotrexate on hold. Steroid changed to stress dose steroids.   Primary osteoarthritis of both knees   History of gastroesophageal reflux (GERD): Stable    Hypokalemia: Replacing   Code Status: Full code   Family Communication: Son at the bedside   Disposition Plan: Likely will be here for several more days until she is significantly improved    Consultants:  None   Procedures:  None   Antimicrobials:  IV vancomycin 5/16-present  IV Levaquin 5/16-5/17  IV cefepime 5/17-present   DVT prophylaxis:  Lovenox   Objective: Vitals:   04/25/17 1430 04/25/17 1511 04/25/17 2103 04/26/17 0626  BP: 100/64 95/62 106/65 (!) 145/70  Pulse: (!) 105 (!) 101 94 93  Resp: 20 (!) 22 (!) 21 20  Temp:  98.3 F (36.8 C)  99 F (37.2 C) 98.4 F (36.9 C)  TempSrc:  Oral Oral Oral  SpO2: 91% 95% 96% 96%  Weight:  78.8 kg (173 lb 11.6 oz)    Height:  5' 4" (1.626 m)      Intake/Output Summary (Last 24 hours) at 04/26/17 1403 Last data filed at 04/26/17 1100  Gross per 24 hour  Intake          1986.67 ml  Output              550 ml  Net          1436.67 ml   Filed Weights   04/25/17 1130 04/25/17 1511  Weight: 79.4 kg (175 lb) 78.8 kg (173 lb 11.6 oz)    Exam:   General:  Alert and oriented 3, no acute distress   Cardiovascular: Regular rate and rhythm, S1-S2, borderline tachycardia   Respiratory: Decreased breath sounds bibasilar   Abdomen: Soft, nontender, nondistended, normal active bowel sounds   Musculoskeletal: \No clubbing or cyanosis, trace pitting edema bilaterally   Skin: No skin breaks, tears or lesions  Psychiatry: Patient is appropriate, no evidence of psychoses    Data Reviewed: CBC:  Recent Labs Lab 04/25/17 1124  WBC 10.9*  NEUTROABS 7.0  HGB 14.9  HCT 44.1  MCV 98.2  PLT 159   Basic Metabolic Panel:  Recent Labs Lab 04/25/17 1206  NA 137  K 2.8*  CL 96*  CO2 27  GLUCOSE 122*  BUN 14  CREATININE 1.02*  CALCIUM 9.1   GFR: Estimated Creatinine Clearance: 43.2 mL/min (A) (by C-G formula based on SCr of 1.02 mg/dL (H)). Liver Function Tests:  Recent Labs Lab 04/25/17 1206  AST 45*  ALT 23  ALKPHOS 60  BILITOT 0.8  PROT 8.2*  ALBUMIN 4.0   No results for input(s): LIPASE, AMYLASE in the last 168 hours. No results for input(s): AMMONIA in the last 168 hours. Coagulation Profile: No results for input(s): INR, PROTIME in the last 168 hours. Cardiac Enzymes: No results for input(s): CKTOTAL, CKMB, CKMBINDEX, TROPONINI in the last 168 hours. BNP (last 3 results) No results for input(s): PROBNP in the last 8760 hours. HbA1C: No results for input(s): HGBA1C in the last 72 hours. CBG: No results for input(s): GLUCAP in the last 168  hours. Lipid Profile: No results for input(s): CHOL, HDL, LDLCALC, TRIG, CHOLHDL, LDLDIRECT in the last 72 hours. Thyroid Function Tests: No results for input(s): TSH, T4TOTAL, FREET4, T3FREE, THYROIDAB in the last 72 hours. Anemia Panel: No results for input(s): VITAMINB12, FOLATE, FERRITIN, TIBC, IRON, RETICCTPCT in the last 72 hours. Urine analysis:    Component Value Date/Time   COLORURINE YELLOW 04/25/2017 1124   APPEARANCEUR HAZY (A) 04/25/2017 1124   LABSPEC 1.015 04/25/2017 1124   PHURINE 6.0 04/25/2017 1124   GLUCOSEU NEGATIVE 04/25/2017 1124   HGBUR LARGE (A) 04/25/2017 1124   BILIRUBINUR NEGATIVE 04/25/2017 1124   KETONESUR 20 (A) 04/25/2017 1124   PROTEINUR >=300 (A) 04/25/2017 1124   UROBILINOGEN 0.2 04/12/2010 1121   NITRITE NEGATIVE 04/25/2017 1124   LEUKOCYTESUR NEGATIVE 04/25/2017 1124   Sepsis Labs: _0 (procalcitonin:4,lacticidven:4)  ) Recent Results (from the past 240 hour(s))  Blood Culture (routine x 2)     Status: None (Preliminary result)   Collection Time: 04/25/17 11:37 AM  Result Value Ref Range Status   Specimen Description BLOOD LEFT ARM  Final   Special Requests   Final    BOTTLES DRAWN AEROBIC AND ANAEROBIC Blood Culture adequate volume   Culture   Final    NO GROWTH < 24 HOURS Performed at [facility], [place] [address], [place], [place] [address]    Report Status PENDING  Incomplete  Blood Culture (routine x 2)     Status: None (Preliminary result)   Collection Time: 04/25/17 11:45 AM  Result Value Ref Range Status   Specimen Description BLOOD LEFT FOREARM  Final   Special Requests IN PEDIATRIC BOTTLE Blood Culture adequate volume  Final   Culture   Final    NO GROWTH < 24 HOURS Performed at [facility], [place] [address] [place],  [address]    Report Status PENDING  Incomplete      Studies: No results found.  Scheduled Meds: . atorvastatin  20 mg Oral q1800  . cholecalciferol  5,000 Units Oral Daily  .  enoxaparin (LOVENOX) injection  40 mg Subcutaneous Q24H  . fluticasone  2 spray Each Nare Daily  . folic acid  1 mg Oral Daily  . furosemide  60 mg Oral Daily  . guaiFENesin  600 mg Oral BID  . loratadine  10 mg Oral Daily  . oxymetazoline  1 spray Each Nare BID  . pantoprazole  40 mg Oral Daily  . predniSONE  10 mg Oral Q breakfast  . traMADol  50 mg Oral TID    Continuous Infusions: . sodium chloride 100 mL/hr at 04/26/17 0245  . sodium chloride    . vancomycin Stopped (04/26/17 1126)     LOS: 1 day     Annita Brod, MD [facility] Pager [phone]  If 7PM-7AM, please contact night-coverage [email or web address] Password [email or web address]  04/26/2017, 2:03 PM

## 2017-04-26 NOTE — Evaluation (Signed)
Physical Therapy Evaluation Patient Details Name: Eileen Wilkerson MRN: [id number] DOB: December 23, 1931 Today's Date: 04/26/2017   History of Present Illness  83 y.o. female with medical history significant of rheumatoid arthritis (on chronic prednisone, methotrexate and Plaquenil), chronic lower extremity edema on Lasix and admitted for sepsis likely secondary to pneumonia  Clinical Impression  Pt admitted with above diagnosis. Pt currently with functional limitations due to the deficits listed below (see PT Problem List).  Pt will benefit from skilled PT to increase their independence and safety with mobility to allow discharge to the venue listed below.  Pt assisted OOB to [facility] and then recliner. Pt declined ambulating today however mobilizing well in room.  Pt anticipated to make good progress and d/c home.     Follow Up Recommendations Home health PT (may progress to no f/u needs)    Equipment Recommendations  Rolling walker with 5" wheels    Recommendations for Other Services       Precautions / Restrictions Precautions Precautions: Fall      Mobility  Bed Mobility Overal bed mobility: Modified Independent                Transfers Overall transfer level: Needs assistance Equipment used: None Transfers: Sit to/from Stand;Stand Pivot Transfers Sit to Stand: Min guard Stand pivot transfers: Min guard       General transfer comment: pt utilized bed rail and armrests for transfers, bed to [facility] to recliner, no LOB observed, min/guard for safety, SPO2 97% on room air after transfers  Ambulation/Gait             General Gait Details: pt declined ambulating today  Stairs            Wheelchair Mobility    Modified Rankin (Stroke Patients Only)       Balance Overall balance assessment: Needs assistance           Standing balance-Leahy Scale: Fair Standing balance comment: able to stand and perform pericare without support                              Pertinent Vitals/Pain Pain Assessment: No/denies pain    Home Living Family/patient expects to be discharged to:: Private residence Living Arrangements: Spouse/significant other Available Help at Discharge: Family Type of Home: House Home Access: Ramped entrance     Home Layout: One level Home Equipment: Bedside commode;Cane - single point Additional Comments: pt assists with care for her husband; he also has caregivers    Prior Function Level of Independence: Independent               Hand Dominance        Extremity/Trunk Assessment        Lower Extremity Assessment Lower Extremity Assessment: Generalized weakness       Communication   Communication: No difficulties  Cognition Arousal/Alertness: Awake/alert Behavior During Therapy: WFL for tasks assessed/performed Overall Cognitive Status: Within Functional Limits for tasks assessed                                        General Comments      Exercises     Assessment/Plan    PT Assessment Patient needs continued PT services  PT Problem List Decreased strength;Decreased activity tolerance;Decreased mobility;Decreased knowledge of use of DME  PT Treatment Interventions Gait training;DME instruction;Therapeutic activities;Therapeutic exercise;Functional mobility training;Patient/family education;Stair training    PT Goals (Current goals can be found in the Care Plan section)  Acute Rehab PT Goals PT Goal Formulation: With patient Time For Goal Achievement: 05/03/17 Potential to Achieve Goals: Good    Frequency Min 3X/week   Barriers to discharge        Co-evaluation               AM-PAC PT "6 Clicks" Daily Activity  Outcome Measure Difficulty turning over in bed (including adjusting bedclothes, sheets and blankets)?: None Difficulty moving from lying on back to sitting on the side of the bed? : None Difficulty sitting down on and standing up from  a chair with arms (Wilkerson.g., wheelchair, bedside commode, etc,.)?: A Little Help needed moving to and from a bed to chair (including a wheelchair)?: A Little Help needed walking in hospital room?: A Little Help needed climbing 3-5 steps with a railing? : A Little 6 Click Score: 20    End of Session   Activity Tolerance: Patient tolerated treatment well Patient left: in chair;with call bell/phone within reach;with family/visitor present (agrees to call for assist out of recliner) Nurse Communication: Mobility status PT Visit Diagnosis: Other abnormalities of gait and mobility (R26.89)    Time: 0940-7680 PT Time Calculation (min) (ACUTE ONLY): 23 min   Charges:   PT Evaluation $PT Eval Low Complexity: 1 Procedure     PT G CodesCarmelia Wilkerson, PT, DPT 04/26/2017 Pager: [phone]   Eileen Wilkerson 04/26/2017, 11:53 AM

## 2017-04-27 DIAGNOSIS — M05722 Rheumatoid arthritis with rheumatoid factor of left elbow without organ or systems involvement: Secondary | ICD-10-CM

## 2017-04-27 DIAGNOSIS — M05721 Rheumatoid arthritis with rheumatoid factor of right elbow without organ or systems involvement: Secondary | ICD-10-CM

## 2017-04-27 LAB — BASIC METABOLIC PANEL
Anion gap: 10 (ref 5–15)
BUN: 18 mg/dL (ref 6–20)
CO2: 24 mmol/L (ref 22–32)
Calcium: 8.6 mg/dL — ABNORMAL LOW (ref 8.9–10.3)
Chloride: 108 mmol/L (ref 101–111)
Creatinine, Ser: 0.88 mg/dL (ref 0.44–1.00)
GFR calc Af Amer: >60 mL/min (ref >60)
GFR calc non Af Amer: 60 mL/min — ABNORMAL LOW (ref >60)
Glucose, Bld: 87 mg/dL (ref 65–99)
Potassium: 3 mmol/L — ABNORMAL LOW (ref 3.5–5.1)
Sodium: 142 mmol/L (ref 135–145)

## 2017-04-27 LAB — CBC
HCT: 38.6 % (ref 36.0–46.0)
Hemoglobin: 12.6 g/dL (ref 12.0–15.0)
MCH: 32.8 pg (ref 26.0–34.0)
MCHC: 32.6 g/dL (ref 30.0–36.0)
MCV: 100.5 fL — ABNORMAL HIGH (ref 78.0–100.0)
Platelets: 236 10*3/uL (ref 150–400)
RBC: 3.84 MIL/uL — ABNORMAL LOW (ref 3.87–5.11)
RDW: 15.6 % — ABNORMAL HIGH (ref 11.5–15.5)
WBC: 9.1 10*3/uL (ref 4.0–10.5)

## 2017-04-27 LAB — LACTIC ACID, PLASMA: Lactic Acid, Venous: 1.3 mmol/L (ref 0.5–1.9)

## 2017-04-27 MED ORDER — GUAIFENESIN ER 600 MG PO TB12: 600.0000 mg | ORAL_TABLET | Freq: Two times a day (BID) | ORAL | Status: DC

## 2017-04-27 MED ORDER — POTASSIUM CHLORIDE CRYS ER 20 MEQ PO TBCR
40.0000 meq | EXTENDED_RELEASE_TABLET | Freq: Once | ORAL | Status: AC
  Administered 2017-04-27: 40 meq via ORAL
  Filled 2017-04-27: qty 2

## 2017-04-27 MED ORDER — LEVOFLOXACIN 750 MG PO TABS: 750.0000 mg | ORAL_TABLET | Freq: Every day | ORAL | 0 refills | Status: AC

## 2017-04-27 NOTE — Progress Notes (Signed)
Discharge plan:  Pt offered choice for home health services and [facility] was chosen. [facility] rep contacted for referral. Pt declines rolling walker for home.  Marney Doctor RN,BSN,NCM [phone]

## 2017-04-27 NOTE — Progress Notes (Signed)
Pt d/c to home. Discharge instructions, AVS, PT plans, and reason to return to ED/MD reviewed with pt. PIV removed without complication. Pt endorsed understanding with teachback method. Pt to be picked up by daughter.

## 2017-04-27 NOTE — Discharge Summary (Signed)
Physician Discharge Summary  Eileen Wilkerson OPF:[id number] DOB: 1932/04/06 DOA: 04/25/2017  PCP: Thressa Sheller, MD  Admit date: 04/25/2017 Discharge date: 04/27/2017  Time spent: 45 minutes  Recommendations for Outpatient Follow-up:  -Will be discharged home today. -HH PT will be arranged prior to DC. -6 more days of Levaquin for treatment of her CAP. -Advised to follow up with PCP in 2 weeks.   Discharge Diagnoses:  Principal Problem:   Sepsis ([place]) Active Problems:   Rheumatoid arthritis ([place])   Primary osteoarthritis of both knees   History of gastroesophageal reflux (GERD)   PNA (pneumonia)   Hypokalemia   Discharge Condition: Stable and improved  Filed Weights   04/25/17 1130 04/25/17 1511  Weight: 79.4 kg (175 lb) 78.8 kg (173 lb 11.6 oz)    History of present illness:  As per Dr. Sloan Leiter on 5/16: Eileen Wilkerson is a 83 y.o. female with medical history significant of rheumatoid arthritis (on chronic prednisone, methotrexate and Plaquenil), chronic lower extremity edema on Lasix-brought to the ED for evaluation of confusion, cough. Note, patient is not a best historian-most of this history is obtained after speaking with the patient's daughter at bedside. Apparently this past Sunday, patient's spouse started having URI symptoms, subsequent to this the patient then started having similar symptoms. She initially had nasal congestion and bilateral maxillary sinus fullness and mild tenderness. This was followed by sore throat. Over the past few days, the symptoms have improved and she started having cough-which is mostly productive with yellow issue white phlegm. Yesterday, her daughter took her to a local urgent care, she was started on doxycycline. This morning, her home health aide found her to be very confused and weak, she was then referred to the emergency room.  During my evaluation in the emergency room, patient was much more awake and alert-and was  actually able to participate in the history taking process. She denied any headache, chest pain or shortness of breath. She appeared very weak but otherwise stable.  There is no history of headaches, nausea, vomiting or diarrhea.  Hospital Course:   Sepsis -Secondary to CAP. -Sepsis parameters have resolved. -Cx data remains negative. -Respiratory virus panel with a Parainfluenza 3 virus.  -CAP is most likely viral, however given her immunosuppression (RA on chronic steroids and MTX), will complete a course of levaquin.  RA -Continue home agents.  Hypokalemia -Replaced prior to DC.  Procedures:  None   Consultations:  None  Discharge Instructions  Discharge Instructions    Diet - low sodium heart healthy    Complete by:  As directed    Increase activity slowly    Complete by:  As directed      Allergies as of 04/27/2017      Reactions   Adhesive [tape] Rash   Celebrex [celecoxib] Hives   Cymbalta [duloxetine Hcl] Swelling   Gabitril [tiagabine] Swelling   Keflex [cephalexin] Nausea And Vomiting   Lyrica [pregabalin] Swelling   Neurontin [gabapentin] Swelling   Nexium [esomeprazole] Rash   Nsaids Rash   Penicillins Rash   Injection site reaction Has patient had a PCN reaction causing immediate rash, facial/tongue/throat swelling, SOB or lightheadedness with hypotension: No Has patient had a PCN reaction causing severe rash involving mucus membranes or skin necrosis: No Has patient had a PCN reaction that required hospitalization No Has patient had a PCN reaction occurring within the last 10 years: No If all of the above answers are "NO", then may proceed with  Cephalosporin use.   Shrimp [shellfish Allergy] Anaphylaxis   Sulfa Antibiotics Other (See Comments)   "deathly ill"/nausea      Medication List    STOP taking these medications   PRESCRIPTION MEDICATION   RECLAST IV     TAKE these medications   acetaminophen 650 MG CR tablet Commonly known as:   TYLENOL Take 1,300 mg by mouth 2 (two) times daily.   atorvastatin 20 MG tablet Commonly known as:  LIPITOR Take 20 mg by mouth At bedtime.   BIOTIN MAXIMUM STRENGTH 10 MG Tabs Generic drug:  Biotin Take 10 mg by mouth daily.   diclofenac sodium 1 % Gel Commonly known as:  VOLTAREN APPLY 3 GRAMS TO 3 LARGEST JOINTS THREE TIMES DAILY AS NEEDED   fluticasone 50 MCG/ACT nasal spray Commonly known as:  FLONASE   folic acid 1 MG tablet Commonly known as:  FOLVITE TAKE TWO TABLETS BY MOUTH ONCE DAILY   furosemide 40 MG tablet Commonly known as:  LASIX Take 60 mg by mouth Daily.   guaiFENesin 600 MG 12 hr tablet Commonly known as:  MUCINEX Take 1 tablet (600 mg total) by mouth 2 (two) times daily.   hydroxychloroquine 200 MG tablet Commonly known as:  PLAQUENIL TAKE ONE TABLET BY MOUTH ONCE DAILY   levofloxacin 750 MG tablet Commonly known as:  LEVAQUIN Take 1 tablet (750 mg total) by mouth daily.   methocarbamol 500 MG tablet Commonly known as:  ROBAXIN TAKE ONE TABLET BY MOUTH EVERY 6 TO 8 HOURS AS NEEDED FOR PAIN   Methotrexate (PF) 15 MG/0.4ML Soaj Commonly known as:  OTREXUP INJECT ONE PEN (15 MG) SUBCUTANEOUSLY ONCE EVERY WEEK. STORE AT ROOM TEMPERATURE BETWEEN 68 - 77 DEGREES F.   predniSONE 5 MG tablet Commonly known as:  DELTASONE Take 1 tablet (5 mg total) by mouth daily with breakfast.   PREVACID 24HR 15 MG capsule Generic drug:  lansoprazole Take 15 mg by mouth Daily.   SAVELLA 50 MG Tabs tablet Generic drug:  Milnacipran Take 50 mg by mouth 2 (two) times daily.   traMADol 50 MG tablet Commonly known as:  ULTRAM TAKE 1 TABLET BY MOUTH THREE TIMES DAILY   Vitamin D3 5000 units Caps Take 5,000 Units by mouth daily.      Allergies  Allergen Reactions  . Adhesive [Tape] Rash  . Celebrex [Celecoxib] Hives  . Cymbalta [Duloxetine Hcl] Swelling  . Gabitril [Tiagabine] Swelling  . Keflex [Cephalexin] Nausea And Vomiting  . Lyrica [Pregabalin]  Swelling  . Neurontin [Gabapentin] Swelling  . Nexium [Esomeprazole] Rash  . Nsaids Rash  . Penicillins Rash    Injection site reaction Has patient had a PCN reaction causing immediate rash, facial/tongue/throat swelling, SOB or lightheadedness with hypotension: No Has patient had a PCN reaction causing severe rash involving mucus membranes or skin necrosis: No Has patient had a PCN reaction that required hospitalization No Has patient had a PCN reaction occurring within the last 10 years: No If all of the above answers are "NO", then may proceed with Cephalosporin use.   . Shrimp [Shellfish Allergy] Anaphylaxis  . Sulfa Antibiotics Other (See Comments)    "deathly ill"/nausea   Follow-up Information    Thressa Sheller, MD. Schedule an appointment as soon as possible for a visit in 2 week(s).   Specialty:  Internal Medicine Contact information: [place], [place] [address] [phone]            The results of significant diagnostics  from this hospitalization (including imaging, microbiology, ancillary and laboratory) are listed below for reference.    Significant Diagnostic Studies: Dg Chest Port 1 View  Result Date: 04/25/2017 CLINICAL DATA:  Nonproductive cough.  Fatigue. EXAM: PORTABLE CHEST 1 VIEW COMPARISON:  01/23/2017 . FINDINGS: Mediastinum and hilar structures normal. Heart size normal. Low lung volumes with mild bibasilar atelectasis. Mild bibasilar infiltrates cannot be excluded. Tiny left pleural effusion cannot be excluded. No pneumothorax. No acute bony abnormality . IMPRESSION: Low lung volumes with mild bibasilar atelectasis. Mild bibasilar infiltrates cannot be excluded. Small left pleural effusion cannot be excluded. Electronically Signed   By: Marcello Moores  Register   On: 04/25/2017 11:48   Xr Foot Complete Right  Result Date: 04/12/2017 Three-view radiographs of the right foot shows stable alignment of the metaphyseal diaphyseal fracture  base of the fifth metatarsal right foot no evidence of any malunion or nonunion. The fracture appears to be healing as expected.  Xr Foot Complete Right  Result Date: 03/29/2017 Radiographs of right foot show faint lucency of metaphysis of 5th metatarsal consistent with fracture.   Microbiology: Recent Results (from the past 240 hour(s))  Blood Culture (routine x 2)     Status: None (Preliminary result)   Collection Time: 04/25/17 11:37 AM  Result Value Ref Range Status   Specimen Description BLOOD LEFT ARM  Final   Special Requests   Final    BOTTLES DRAWN AEROBIC AND ANAEROBIC Blood Culture adequate volume   Culture   Final    NO GROWTH 1 DAY Performed at [facility], [address] [place], [place] [address]    Report Status PENDING  Incomplete  Blood Culture (routine x 2)     Status: None (Preliminary result)   Collection Time: 04/25/17 11:45 AM  Result Value Ref Range Status   Specimen Description BLOOD LEFT FOREARM  Final   Special Requests IN PEDIATRIC BOTTLE Blood Culture adequate volume  Final   Culture   Final    NO GROWTH 1 DAY Performed at [facility], [place] [address] [place], [place] [address]    Report Status PENDING  Incomplete  Respiratory Panel by PCR     Status: Abnormal   Collection Time: 04/25/17  5:01 PM  Result Value Ref Range Status   Adenovirus NOT DETECTED NOT DETECTED Final   Coronavirus 229E NOT DETECTED NOT DETECTED Final   Coronavirus HKU1 NOT DETECTED NOT DETECTED Final   Coronavirus NL63 NOT DETECTED NOT DETECTED Final   Coronavirus OC43 NOT DETECTED NOT DETECTED Final   Metapneumovirus NOT DETECTED NOT DETECTED Final   Rhinovirus / Enterovirus NOT DETECTED NOT DETECTED Final   Influenza A NOT DETECTED NOT DETECTED Final   Influenza B NOT DETECTED NOT DETECTED Final   Parainfluenza Virus 1 NOT DETECTED NOT DETECTED Final   Parainfluenza Virus 2 NOT DETECTED NOT DETECTED Final   Parainfluenza Virus 3 DETECTED (A) NOT DETECTED  Final   Parainfluenza Virus 4 NOT DETECTED NOT DETECTED Final   Respiratory Syncytial Virus NOT DETECTED NOT DETECTED Final   Bordetella pertussis NOT DETECTED NOT DETECTED Final   Chlamydophila pneumoniae NOT DETECTED NOT DETECTED Final   Mycoplasma pneumoniae NOT DETECTED NOT DETECTED Final    Comment: Performed at [facility], [place] [address] [place], [place] [address]  MRSA PCR Screening     Status: None   Collection Time: 04/26/17  2:14 PM  Result Value Ref Range Status   MRSA by PCR NEGATIVE NEGATIVE Final  Comment:        The GeneXpert MRSA Assay (FDA approved for NASAL specimens only), is one component of a comprehensive MRSA colonization surveillance program. It is not intended to diagnose MRSA infection nor to guide or monitor treatment for MRSA infections.      Labs: Basic Metabolic Panel:  Recent Labs Lab 04/25/17 1206 04/27/17 0520  NA 137 142  K 2.8* 3.0*  CL 96* 108  CO2 27 24  GLUCOSE 122* 87  BUN 14 18  CREATININE 1.02* 0.88  CALCIUM 9.1 8.6*   Liver Function Tests:  Recent Labs Lab 04/25/17 1206  AST 45*  ALT 23  ALKPHOS 60  BILITOT 0.8  PROT 8.2*  ALBUMIN 4.0   No results for input(s): LIPASE, AMYLASE in the last 168 hours. No results for input(s): AMMONIA in the last 168 hours. CBC:  Recent Labs Lab 04/25/17 1124 04/27/17 0520  WBC 10.9* 9.1  NEUTROABS 7.0  --   HGB 14.9 12.6  HCT 44.1 38.6  MCV 98.2 100.5*  PLT 252 236   Cardiac Enzymes: No results for input(s): CKTOTAL, CKMB, CKMBINDEX, TROPONINI in the last 168 hours. BNP: BNP (last 3 results) No results for input(s): BNP in the last 8760 hours.  ProBNP (last 3 results) No results for input(s): PROBNP in the last 8760 hours.  CBG: No results for input(s): GLUCAP in the last 168 hours.     SignedLelon Wilkerson  [facility] Pager: [phone] 04/27/2017, 10:49 AM

## 2017-04-27 NOTE — Progress Notes (Signed)
Physical Therapy Treatment Patient Details Name: Eileen Wilkerson MRN: [id number] DOB: [birth year] Today's Date: 04/27/2017    History of Present Illness 83 y.o. female with medical history significant of rheumatoid arthritis, chronic lower extremity edema on Lasix and admitted for sepsis likely secondary to pneumonia; update to hx 5/18--pt has Nondisplaced fracture 5th metatarsal d/t fall in Feb, to wear post op shoe when amb per past orhto notes (pt concurs)    PT Comments    Pt able to amb today, feeling slightly better; continue to recommend HHPT   Follow Up Recommendations  Home health PT     Equipment Recommendations  Rolling walker with 5" wheels (if pt agrees)    Recommendations for Other Services       Precautions / Restrictions Precautions Precautions: Fall Restrictions Weight Bearing Restrictions: No    Mobility  Bed Mobility Overal bed mobility: Modified Independent                Transfers Overall transfer level: Needs assistance Equipment used: None Transfers: Sit to/from Stand Sit to Stand: Supervision;Min guard         General transfer comment: min/guard on initial stand, pt reaching for PT's shoulder and bedrail but refused use of RW  Ambulation/Gait Ambulation/Gait assistance: Min guard Ambulation Distance (Feet): 60 Feet Assistive device: None (IV pole) Gait Pattern/deviations: Step-through pattern;Step-to pattern;Decreased stride length     General Gait Details: pt with guarded gait, step length improved with incr distance; pt refuses the use of RW although this PT feels it would improve pt's gait efficiency and balance; pt uses SPC at times, allowed pt to use IV pole and stride length improved; pt is unsteady but no overt LOB   Stairs            Wheelchair Mobility    Modified Rankin (Stroke Patients Only)       Balance Overall balance assessment: Needs assistance         Standing balance support: During functional  activity;Single extremity supported;No upper extremity supported Standing balance-Leahy Scale: Fair Standing balance comment: pt is unsteady during dynamic activities without UE support; attempts to hold onto PT and rails in hallway                            Cognition Arousal/Alertness: Awake/alert Behavior During Therapy: [facility] for tasks assessed/performed Overall Cognitive Status: Within Functional Limits for tasks assessed                                        Exercises      General Comments        Pertinent Vitals/Pain Pain Assessment: No/denies pain    Home Living                      Prior Function            PT Goals (current goals can now be found in the care plan section) Acute Rehab PT Goals PT Goal Formulation: With patient Time For Goal Achievement: 05/03/17 Potential to Achieve Goals: Good Progress towards PT goals: Progressing toward goals    Frequency    Min 3X/week      PT Plan Current plan remains appropriate    Co-evaluation              AM-PAC PT "6 Clicks" Daily  Activity  Outcome Measure  Difficulty turning over in bed (including adjusting bedclothes, sheets and blankets)?: None Difficulty moving from lying on back to sitting on the side of the bed? : None Difficulty sitting down on and standing up from a chair with arms (e.g., wheelchair, bedside commode, etc,.)?: A Little Help needed moving to and from a bed to chair (including a wheelchair)?: A Little Help needed walking in hospital room?: A Little Help needed climbing 3-5 steps with a railing? : A Little 6 Click Score: 20    End of Session Equipment Utilized During Treatment: Gait belt Activity Tolerance: Patient tolerated treatment well Patient left: in chair;with call bell/phone within reach (bed alarm not activated on arrival)   PT Visit Diagnosis: Other abnormalities of gait and mobility (R26.89)     Time: 6147-0929 PT Time  Calculation (min) (ACUTE ONLY): 17 min  Charges:  $Gait Training: 8-22 mins                    G CodesKenyon Ana, PT Pager: [phone] 04/27/2017    Kenyon Ana 04/27/2017, 12:12 PM

## 2017-04-30 LAB — CULTURE, BLOOD (ROUTINE X 2)
Culture: NO GROWTH
Culture: NO GROWTH
Special Requests: ADEQUATE
Special Requests: ADEQUATE

## 2017-05-03 ENCOUNTER — Other Ambulatory Visit: Payer: Self-pay | Admitting: Rheumatology

## 2017-05-03 NOTE — Telephone Encounter (Signed)
ok 

## 2017-05-03 NOTE — Telephone Encounter (Signed)
Last Visit: 04/19/17 Next Visit: 06/27/17 Labs: 04/19/17 C/w previous labs PLQ Eye Exam: 09/26/16  Okay to refill PLQ?

## 2017-05-08 ENCOUNTER — Ambulatory Visit (INDEPENDENT_AMBULATORY_CARE_PROVIDER_SITE_OTHER): Payer: Medicare Other | Admitting: Family Medicine

## 2017-05-08 ENCOUNTER — Encounter: Payer: Self-pay | Admitting: Family Medicine

## 2017-05-08 ENCOUNTER — Other Ambulatory Visit: Payer: Self-pay

## 2017-05-08 ENCOUNTER — Ambulatory Visit (INDEPENDENT_AMBULATORY_CARE_PROVIDER_SITE_OTHER): Payer: Medicare Other

## 2017-05-08 DIAGNOSIS — M810 Age-related osteoporosis without current pathological fracture: Secondary | ICD-10-CM

## 2017-05-08 DIAGNOSIS — J189 Pneumonia, unspecified organism: Secondary | ICD-10-CM

## 2017-05-08 DIAGNOSIS — R05 Cough: Secondary | ICD-10-CM

## 2017-05-08 DIAGNOSIS — M05722 Rheumatoid arthritis with rheumatoid factor of left elbow without organ or systems involvement: Secondary | ICD-10-CM

## 2017-05-08 DIAGNOSIS — M797 Fibromyalgia: Secondary | ICD-10-CM

## 2017-05-08 DIAGNOSIS — M05721 Rheumatoid arthritis with rheumatoid factor of right elbow without organ or systems involvement: Secondary | ICD-10-CM | POA: Diagnosis not present

## 2017-05-08 DIAGNOSIS — R059 Cough, unspecified: Secondary | ICD-10-CM

## 2017-05-08 LAB — CBC WITH DIFFERENTIAL/PLATELET
Basophils Absolute: 0.1 10*3/uL (ref 0.0–0.1)
Basophils Relative: 0.9 % (ref 0.0–3.0)
Eosinophils Absolute: 0.1 10*3/uL (ref 0.0–0.7)
Eosinophils Relative: 0.5 % (ref 0.0–5.0)
HCT: 39.1 % (ref 36.0–46.0)
Hemoglobin: 12.8 g/dL (ref 12.0–15.0)
Lymphocytes Relative: 15.4 % (ref 12.0–46.0)
Lymphs Abs: 1.9 10*3/uL (ref 0.7–4.0)
MCHC: 32.8 g/dL (ref 30.0–36.0)
MCV: 98.5 fl (ref 78.0–100.0)
Monocytes Absolute: 1.3 10*3/uL — ABNORMAL HIGH (ref 0.1–1.0)
Monocytes Relative: 10.3 % (ref 3.0–12.0)
Neutro Abs: 9 10*3/uL — ABNORMAL HIGH (ref 1.4–7.7)
Neutrophils Relative %: 72.9 % (ref 43.0–77.0)
Platelets: 397 10*3/uL (ref 150.0–400.0)
RBC: 3.97 Mil/uL (ref 3.87–5.11)
RDW: 15 % (ref 11.5–15.5)
WBC: 12.3 10*3/uL — ABNORMAL HIGH (ref 4.0–10.5)

## 2017-05-08 LAB — COMPREHENSIVE METABOLIC PANEL
ALT: 13 U/L (ref 0–35)
AST: 20 U/L (ref 0–37)
Albumin: 4.3 g/dL (ref 3.5–5.2)
Alkaline Phosphatase: 51 U/L (ref 39–117)
BUN: 16 mg/dL (ref 6–23)
CO2: 32 mEq/L (ref 19–32)
Calcium: 10.3 mg/dL (ref 8.4–10.5)
Chloride: 101 mEq/L (ref 96–112)
Creatinine, Ser: 1 mg/dL (ref 0.40–1.20)
GFR: 56.32 mL/min — ABNORMAL LOW (ref >60.00)
Glucose, Bld: 110 mg/dL — ABNORMAL HIGH (ref 70–99)
Potassium: 3.9 mEq/L (ref 3.5–5.1)
Sodium: 142 mEq/L (ref 135–145)
Total Bilirubin: 0.4 mg/dL (ref 0.2–1.2)
Total Protein: 8.2 g/dL (ref 6.0–8.3)

## 2017-05-08 MED ORDER — LEVOFLOXACIN 500 MG PO TABS: 500.0000 mg | ORAL_TABLET | Freq: Every day | ORAL | 0 refills | Status: DC

## 2017-05-08 NOTE — Progress Notes (Signed)
Eileen Wilkerson is a 83 y.o. female is here to [place].   Patient Care Team: Briscoe Deutscher, DO as PCP - General (Family Medicine)   History of Present Illness:   Eileen Wilkerson, CMA, acting as scribe for Dr. Juleen China.  CC:  Patient comes in today for hospital follow up.  Would also like to establish care.  Recently hospitalized for pneumonia.  Still has cough.  Worse at night.  Denies shortness of breath.  Taking Mucinex for the cough.  Has osteoporosis and gets Reclast once a year.  Currently wearing a post surgical shoe on her right foot for fracture.  States she follows with orthopedics for this and has an appointment in the next few days.    HPI: This patient is an 83 year old female with a past medical history significant for rheumatoid arthritis that was recently discharged from the hospital after diagnosis and treatment of sepsis secondary to community-acquired pneumonia. Though her community-acquired pneumonia was thought to be viral, her immunosuppression from rheumatoid arthritis medications prompted the hospitalist to give her a course of Levaquin. She improved and was discharged with 1 week's worth of medication. She finished that medication 4 days ago. She has continued to take all of her other medications during this time. Unfortunately, the patient continues to have a cough that has been getting worse and is described as wet. She denies chest pain, shortness of breath, wheeze, headaches, dizziness, or lower extremity edema.  Health Maintenance Due  Topic Date Due  . TETANUS/TDAP  09/07/1951  . DEXA SCAN  09/08/1999  . PNA vac Low Risk Adult (1 of 2 - PCV13) 09/08/1999   PMHx, SurgHx, SocialHx, Medications, and Allergies were reviewed in the Visit Navigator and updated as appropriate.   Past Medical History:  Diagnosis Date  . Adrenal failure ([place])   . Arthritis   . Cataract   . Fibromyalgia 2008  . Osteoporosis   . RA (rheumatoid arthritis) ([place])    Past  Surgical History:  Procedure Laterality Date  . BACK SURGERY    . BILATERAL CARPAL TUNNEL RELEASE    . CATARACT EXTRACTION, BILATERAL  2004  . CERVICAL FUSION  2011,2010,2008  . HEEL SPUR SURGERY  2004  . ROTATOR CUFF REPAIR    . TONSILLECTOMY AND ADENOIDECTOMY  1947   Family History  Problem Relation Age of Onset  . Breast cancer Mother   . Diabetes Father    Social History  Substance Use Topics  . Smoking status: Former Research scientist (life sciences)  . Smokeless tobacco: Never Used  . Alcohol use No   Current Medications and Allergies:   .  acetaminophen (TYLENOL) 650 MG CR tablet, Take 1,300 mg by mouth 2 (two) times daily., Disp: , Rfl:  .  atorvastatin (LIPITOR) 20 MG tablet, Take 20 mg by mouth At bedtime., Disp: , Rfl:  .  Biotin (BIOTIN MAXIMUM STRENGTH) 10 MG TABS, Take 10 mg by mouth daily., Disp: , Rfl:  .  Cholecalciferol (VITAMIN D3) 5000 units CAPS, Take 5,000 Units by mouth daily., Disp: , Rfl:  .  fluticasone (FLONASE) 50 MCG/ACT nasal spray, , Disp: , Rfl:  .  folic acid (FOLVITE) 1 MG tablet, TAKE TWO TABLETS BY MOUTH ONCE DAILY, Disp: 180 tablet, Rfl: 3 .  furosemide (LASIX) 40 MG tablet, Take 60 mg by mouth Daily., Disp: , Rfl:  .  guaiFENesin (MUCINEX) 600 MG 12 hr tablet, Take 1 tablet (600 mg total) by mouth 2 (two) times daily., Disp: , Rfl:  .  hydroxychloroquine (PLAQUENIL) 200 MG tablet, TAKE 1 TABLET BY MOUTH ONCE DAILY, Disp: 90 tablet, Rfl: 0 .  Methotrexate, PF, (OTREXUP) 15 MG/0.4ML SOAJ, INJECT ONE PEN (15 MG) SUBCUTANEOUSLY ONCE EVERY WEEK. STORE AT ROOM TEMPERATURE BETWEEN 30 - 56 DEGREES F., Disp: 12 pen, Rfl: 0 .  predniSONE (DELTASONE) 5 MG tablet, Take 1 tablet (5 mg total) by mouth daily with breakfast., Disp: 90 tablet, Rfl: 0 .  PREVACID 24HR 15 MG capsule, Take 15 mg by mouth Daily., Disp: , Rfl:  .  SAVELLA 50 MG TABS, Take 50 mg by mouth 2 (two) times daily. , Disp: , Rfl:  .  traMADol (ULTRAM) 50 MG tablet, TAKE 1 TABLET BY MOUTH THREE TIMES DAILY, Disp: 90  tablet, Rfl: 0  Allergies  Allergen Reactions  . Adhesive [Tape] Rash  . Celebrex [Celecoxib] Hives  . Cymbalta [Duloxetine Hcl] Swelling  . Gabitril [Tiagabine] Swelling  . Keflex [Cephalexin] Nausea And Vomiting  . Lyrica [Pregabalin] Swelling  . Neurontin [Gabapentin] Swelling  . Nexium [Esomeprazole] Rash  . Nsaids Rash  . Penicillins Rash    Injection site reaction Has patient had a PCN reaction causing immediate rash, facial/tongue/throat swelling, SOB or lightheadedness with hypotension: No Has patient had a PCN reaction causing severe rash involving mucus membranes or skin necrosis: No Has patient had a PCN reaction that required hospitalization No Has patient had a PCN reaction occurring within the last 10 years: No If all of the above answers are "NO", then may proceed with Cephalosporin use.   . Shrimp [Shellfish Allergy] Anaphylaxis  . Sulfa Antibiotics Other (See Comments)    "deathly ill"/nausea   Review of Systems:   Review of Systems  Constitutional: Negative for chills and fever.  HENT: Negative for congestion, ear pain and sore throat.   Eyes: Negative for blurred vision and pain.  Respiratory: Positive for cough and sputum production. Negative for shortness of breath.        Recent hospitalization for pneumonia.  Cardiovascular: Negative for chest pain and palpitations.  Gastrointestinal: Negative for abdominal pain, nausea and vomiting.  Genitourinary: Negative for frequency.  Musculoskeletal: Negative for back pain and neck pain.  Skin: Negative for rash.  Neurological: Positive for dizziness, weakness and headaches. Negative for loss of consciousness.  Endo/Heme/Allergies: Does not bruise/bleed easily.  Psychiatric/Behavioral: Negative for depression. The patient is not nervous/anxious.    Vitals:   Vitals:   05/08/17 1328  BP: 132/78  Pulse: 81  Temp: 98.3 F (36.8 C)  TempSrc: Oral  SpO2: 95%  Weight: 173 lb (78.5 kg)  Height: 5' 4.5"  (1.638 m)     Body mass index is 29.24 kg/m.  Physical Exam:   Physical Exam  Constitutional: She appears well-developed and well-nourished. No distress.  HENT:  Head: Normocephalic and atraumatic.  Eyes: EOM are normal. Pupils are equal, round, and reactive to light.  Neck: Normal range of motion. Neck supple.  Cardiovascular: Normal rate, regular rhythm, normal heart sounds and intact distal pulses.   Pulmonary/Chest: Effort normal. She has rhonchi.  Abdominal: Soft.  Skin: Skin is warm.  Psychiatric: She has a normal mood and affect. Her behavior is normal.  Nursing note and vitals reviewed.  EXAM: CHEST  2 VIEW  COMPARISON:  Radiograph of Apr 25, 2017.  FINDINGS: The heart size and mediastinal contours are within normal limits. No pneumothorax or pleural effusion is noted. Multilevel degenerative disc disease is noted in the lower thoracic spine. No acute pulmonary disease is noted.  IMPRESSION: No active cardiopulmonary disease.  Results for orders placed or performed in visit on 05/08/17  CBC with Differential/Platelet  Result Value Ref Range   WBC 12.3 (H) 4.0 - 10.5 K/uL   RBC 3.97 3.87 - 5.11 Mil/uL   Hemoglobin 12.8 12.0 - 15.0 g/dL   HCT 39.1 36.0 - 46.0 %   MCV 98.5 78.0 - 100.0 fl   MCHC 32.8 30.0 - 36.0 g/dL   RDW 15.0 11.5 - 15.5 %   Platelets 397.0 150.0 - 400.0 K/uL   Neutrophils Relative % 72.9 43.0 - 77.0 %   Lymphocytes Relative 15.4 12.0 - 46.0 %   Monocytes Relative 10.3 3.0 - 12.0 %   Eosinophils Relative 0.5 0.0 - 5.0 %   Basophils Relative 0.9 0.0 - 3.0 %   Neutro Abs 9.0 (H) 1.4 - 7.7 K/uL   Lymphs Abs 1.9 0.7 - 4.0 K/uL   Monocytes Absolute 1.3 (H) 0.1 - 1.0 K/uL   Eosinophils Absolute 0.1 0.0 - 0.7 K/uL   Basophils Absolute 0.1 0.0 - 0.1 K/uL  Comprehensive metabolic panel  Result Value Ref Range   Sodium 142 135 - 145 mEq/L   Potassium 3.9 3.5 - 5.1 mEq/L   Chloride 101 96 - 112 mEq/L   CO2 32 19 - 32 mEq/L   Glucose, Bld  110 (H) 70 - 99 mg/dL   BUN 16 6 - 23 mg/dL   Creatinine, Ser 1.00 0.40 - 1.20 mg/dL   Total Bilirubin 0.4 0.2 - 1.2 mg/dL   Alkaline Phosphatase 51 39 - 117 U/L   AST 20 0 - 37 U/L   ALT 13 0 - 35 U/L   Total Protein 8.2 6.0 - 8.3 g/dL   Albumin 4.3 3.5 - 5.2 g/dL   Calcium 10.3 8.4 - 10.5 mg/dL   GFR 56.32 (L) >60.00 mL/min    Assessment and Plan:   Sherena was seen today for establish care and pneumonia.  Diagnoses and all orders for this visit:  Cough Comments: Concern for continued consolidation with elevation in white count. Will continue a 5 day course of Levaquin. We will contact her rheumatologist to see if she should stop methotrexate during this infection. Orders: -     DG Chest 2 View; Future -     CBC with Differential/Platelet -     Comprehensive metabolic panel -     levofloxacin (LEVAQUIN) 500 MG tablet; Take 1 tablet (500 mg total) by mouth daily.  Fibromyalgia Comments: Stable on current medications. Followed by rheumatology.  Rheumatoid arthritis involving both elbows with positive rheumatoid factor (HCC) Comments: The patient is followed by rheumatology. She is still currently taking all of her medications. See above for medication concerns.  Age-related osteoporosis without current pathological fracture Comments: The patient is due for her yearly injection of Reclast. She has tolerated this in the past without any complications.   . Reviewed expectations re: course of current medical issues. . Discussed self-management of symptoms. . Outlined signs and symptoms indicating need for more acute intervention. . Patient verbalized understanding and all questions were answered. Marland Kitchen Health Maintenance issues including appropriate healthy diet, exercise, and smoking avoidance were discussed with patient. . See orders for this visit as documented in the electronic medical record. . Patient received an After Visit Summary.  CMA served as Education administrator during this  visit. History, Physical, and Plan performed by medical provider. The above documentation has been reviewed and is accurate and complete. Briscoe Deutscher, D.O.  Briscoe Deutscher,  DO [place], [facility] 05/11/2017  Future Appointments Date Time Provider [place]  06/27/2017 2:30 PM Bo Merino, MD [facility] None   Records requested if needed. Time spent with the patient: 30 minutes, of which >50% was spent in obtaining information about her symptoms, reviewing her previous labs, evaluations, and treatments, counseling her about her condition (please see the discussed topics above), and developing a plan to further investigate it; she had a number of questions which I addressed. Reviewed her current medical issues as well as her last hospitalization. Discussed management of her acute issues in light of her chronic rheumatoid arthritis and immune suppressing medications.

## 2017-05-09 ENCOUNTER — Telehealth: Payer: Self-pay | Admitting: Rheumatology

## 2017-05-09 NOTE — Telephone Encounter (Signed)
Patient was recently hospital for Sepsis and is needing to know if she is suppose to stop taking her MTX and PLQ.  She states before that when she has had an infection that they had stopped her taking them.  CB#[phone].

## 2017-05-09 NOTE — Telephone Encounter (Signed)
Patient was in the hospital in May 16-18, 2018 for pneumonia. Patient was off her MTX while in the hospital. Patient states she was continued on the PLQ in the hospital. Patient contacted our office to find out if she should still continue the PLQ. Patient advised to continue the PLQ and to stay off the MTX until well. Patient verbalized understanding.

## 2017-05-10 ENCOUNTER — Ambulatory Visit (INDEPENDENT_AMBULATORY_CARE_PROVIDER_SITE_OTHER): Payer: Medicare Other | Admitting: Orthopedic Surgery

## 2017-05-10 ENCOUNTER — Encounter (INDEPENDENT_AMBULATORY_CARE_PROVIDER_SITE_OTHER): Payer: Self-pay | Admitting: Orthopedic Surgery

## 2017-05-10 ENCOUNTER — Ambulatory Visit (INDEPENDENT_AMBULATORY_CARE_PROVIDER_SITE_OTHER): Payer: Medicare Other

## 2017-05-10 DIAGNOSIS — S92354D Nondisplaced fracture of fifth metatarsal bone, right foot, subsequent encounter for fracture with routine healing: Secondary | ICD-10-CM

## 2017-05-10 NOTE — Progress Notes (Signed)
Office Visit Note   Patient: Eileen Wilkerson           Date of Birth: 1932-08-12           MRN: [id number] Visit Date: 05/10/2017              Requested by: Thressa Sheller, MD [address], [place], [place] [address] PCP: Briscoe Deutscher, DO  Chief Complaint  Patient presents with  . Right Foot - Follow-up, Fracture, Pain      HPI: Patient states she just got out of the hospital for pneumonia. Patient states that she has aches and pains all over her foot and ankle she is not sure if this is due to her stress fracture due to her fibromyalgia or due to her rheumatoid arthritis.  Assessment & Plan: Visit Diagnoses:  1. Closed nondisplaced fracture of fifth metatarsal bone of right foot with routine healing, subsequent encounter     Plan: Recommend that she increase her activities as tolerated with weightbearing as tolerated. The radiographs shows a well-healed fracture and do not see any reason to decrease her activities.  Follow-Up Instructions: Return if symptoms worsen or fail to improve.   Ortho Exam  Patient is alert, oriented, no adenopathy, well-dressed, normal affect, normal respiratory effort. Examination patient has difficulty getting from a sitting to a standing position she has a productive cough. Patient's foot is plantigrade she is globally tender to palpation around the foot but she also has pain palpation the base of the fifth metatarsal. She is also globally tender to palpation around the ankle. There is no redness no cellulitis no signs of an acute inflammatory process.  Imaging: Xr Foot Complete Right  Result Date: 05/10/2017 Three-view radiographs the right foot shows a well-healing base of the fifth metatarsal right foot. Patient has no evidence of any new stress fractures. She does have decreased bone mineral density.   Labs: Lab Results  Component Value Date   REPTSTATUS 04/30/2017 FINAL 04/25/2017   CULT  04/25/2017    NO GROWTH 5  DAYS Performed at [facility], [place] [address] [place], [place] [address]     Orders:  Orders Placed This Encounter  Procedures  . XR Foot Complete Right   No orders of the defined types were placed in this encounter.    Procedures: No procedures performed  Clinical Data: No additional findings.  ROS:  All other systems negative, except as noted in the HPI. Review of Systems  Objective: Vital Signs: There were no vitals taken for this visit.  Specialty Comments:  No specialty comments available.  PMFS History: Patient Active Problem List   Diagnosis Date Noted  . Sepsis ([place]) 04/25/2017  . PNA (pneumonia) 04/25/2017  . Hypokalemia 04/25/2017  . Nondisplaced fracture of fifth right metatarsal bone with routine healing 04/12/2017  . Fibromyalgia 12/18/2016  . DJD (degenerative joint disease), cervical 12/18/2016  . Spondylosis of lumbar region without myelopathy or radiculopathy 12/18/2016  . Other fatigue 12/18/2016  . Trigger finger, right middle finger 12/18/2016  . Primary osteoarthritis of both hands 12/18/2016  . Primary osteoarthritis of both feet 12/18/2016  . Primary osteoarthritis of both knees 12/18/2016  . History of gastroesophageal reflux (GERD) 12/18/2016  . Age-related osteoporosis without current pathological fracture 12/18/2016  . High risk medication use 12/18/2016  . Rheumatoid arthritis ([place]) 12/28/2015  . Long term current use of systemic steroids 12/28/2015  . Lymphocytosis 11/13/2012   Past Medical History:  Diagnosis Date  .  Adrenal failure ([place])   . Arthritis   . Cataract   . Fibromyalgia 2008  . Osteoporosis     Family History  Problem Relation Age of Onset  . Breast cancer Mother   . Diabetes Father     Past Surgical History:  Procedure Laterality Date  . BACK SURGERY    . carpel tunnel release  2005   bilateral wrists  . CATARACT EXTRACTION, BILATERAL  2004  . CERVICAL FUSION  2011,2010,2008   c 3--4,4-5, lower  back  . HEEL SPUR SURGERY  2004  . rotater cuff  [id number]  . TONSILLECTOMY AND ADENOIDECTOMY  1947   Social History   Occupational History  . Not on file.   Social History Main Topics  . Smoking status: Former Research scientist (life sciences)  . Smokeless tobacco: Never Used  . Alcohol use No  . Drug use: No  . Sexual activity: Not on file

## 2017-05-11 ENCOUNTER — Encounter: Payer: Self-pay | Admitting: Family Medicine

## 2017-05-16 ENCOUNTER — Telehealth: Payer: Self-pay | Admitting: Family Medicine

## 2017-05-16 NOTE — Telephone Encounter (Signed)
Scheduled acute for cough, low grade fever 06/07 1:45pm.

## 2017-05-16 NOTE — Telephone Encounter (Signed)
Noted  

## 2017-05-17 ENCOUNTER — Ambulatory Visit (INDEPENDENT_AMBULATORY_CARE_PROVIDER_SITE_OTHER): Payer: Medicare Other

## 2017-05-17 ENCOUNTER — Encounter: Payer: Self-pay | Admitting: Family Medicine

## 2017-05-17 ENCOUNTER — Ambulatory Visit (INDEPENDENT_AMBULATORY_CARE_PROVIDER_SITE_OTHER): Payer: Medicare Other | Admitting: Family Medicine

## 2017-05-17 VITALS — BP 128/78 | HR 102 | Temp 98.8°F | Ht 64.5 in | Wt 173.0 lb

## 2017-05-17 DIAGNOSIS — R938 Abnormal findings on diagnostic imaging of other specified body structures: Secondary | ICD-10-CM | POA: Diagnosis not present

## 2017-05-17 DIAGNOSIS — R05 Cough: Secondary | ICD-10-CM

## 2017-05-17 DIAGNOSIS — R9389 Abnormal findings on diagnostic imaging of other specified body structures: Secondary | ICD-10-CM

## 2017-05-17 DIAGNOSIS — R059 Cough, unspecified: Secondary | ICD-10-CM

## 2017-05-17 MED ORDER — METHYLPREDNISOLONE ACETATE 80 MG/ML IJ SUSP
80.0000 mg | Freq: Once | INTRAMUSCULAR | Status: AC
  Administered 2017-05-17: 80 mg via INTRAMUSCULAR

## 2017-05-17 MED ORDER — PREDNISONE 5 MG PO TABS: ORAL_TABLET | ORAL | 0 refills | Status: DC

## 2017-05-17 MED ORDER — AZITHROMYCIN 250 MG PO TABS: ORAL_TABLET | ORAL | 0 refills | Status: DC

## 2017-05-17 MED ORDER — LEVALBUTEROL HCL 1.25 MG/3ML IN NEBU
1.2500 mg | INHALATION_SOLUTION | Freq: Once | RESPIRATORY_TRACT | Status: AC
  Administered 2017-05-17: 1.25 mg via RESPIRATORY_TRACT

## 2017-05-17 NOTE — Progress Notes (Signed)
Eileen Wilkerson is a 83 y.o. female here for an acute visit.  History of Present Illness:   Water quality scientist, CMA, acting as scribe for Dr. Juleen Wilkerson.  HPI This patient is an 83 year old female with a past medical history significant for rheumatoid arthritis that was recently discharged from the hospital after diagnosis and treatment of sepsis secondary to community-acquired pneumonia. Though her community-acquired pneumonia was thought to be viral, her immunosuppression from rheumatoid arthritis medications prompted the hospitalist to give her a course of Levaquin. She improved and was discharged with 1 week's worth of medication. She finished that medication and followed up with me for a hospital follow up/new patient visit. Unfortunately, her cough worsened. CXR was clear, but WBC elevated, so I continued Levaquin for 5 more days. She finished 4 days ago. She stopped MTX, but has continued all other medications. Today, the patient reports that her cough has been getting worse and is described as more productive. She denies chest pain, shortness of breath, wheeze, headaches, dizziness, or lower extremity edema. She is getting more fatigued. Her oxygen was 88% upon arrival at the room, improving quickly to 96%.   PMHx, SurgHx, SocialHx, Medications, and Allergies were reviewed in the Visit Navigator and updated as appropriate.  Current Medications:   .  acetaminophen (TYLENOL) 650 MG CR tablet, Take 1,300 mg by mouth 2 (two) times daily., Disp: , Rfl:  .  atorvastatin (LIPITOR) 20 MG tablet, Take 20 mg by mouth At bedtime., Disp: , Rfl:  .  Biotin (BIOTIN MAXIMUM STRENGTH) 10 MG TABS, Take 10 mg by mouth daily., Disp: , Rfl:  .  Cholecalciferol (VITAMIN D3) 5000 units CAPS, Take 5,000 Units by mouth daily., Disp: , Rfl:  .  fluticasone (FLONASE) 50 MCG/ACT nasal spray, , Disp: , Rfl:  .  folic acid (FOLVITE) 1 MG tablet, TAKE TWO TABLETS BY MOUTH ONCE DAILY, Disp: 180 tablet, Rfl: 3 .  furosemide  (LASIX) 40 MG tablet, Take 60 mg by mouth Daily., Disp: , Rfl:  .  guaiFENesin (MUCINEX) 600 MG 12 hr tablet, Take 1 tablet (600 mg total) by mouth 2 (two) times daily., Disp: , Rfl:  .  hydroxychloroquine (PLAQUENIL) 200 MG tablet, TAKE 1 TABLET BY MOUTH ONCE DAILY, Disp: 90 tablet, Rfl: 0 .  Methotrexate, PF, (OTREXUP) 15 MG/0.4ML SOAJ, INJECT ONE PEN (15 MG) SUBCUTANEOUSLY ONCE EVERY WEEK. STORE AT ROOM TEMPERATURE BETWEEN 68 - 97 DEGREES F., Disp: 12 pen, Rfl: 0 .  PREVACID 24HR 15 MG capsule, Take 15 mg by mouth Daily., Disp: , Rfl:  .  SAVELLA 50 MG TABS, Take 50 mg by mouth 2 (two) times daily. , Disp: , Rfl:  .  traMADol (ULTRAM) 50 MG tablet, TAKE 1 TABLET BY MOUTH THREE TIMES DAILY, Disp: 90 tablet, Rfl: 0 .  Zoledronic Acid (RECLAST IV), Inject into the vein., Disp: , Rfl:   Allergies  Allergen Reactions  . Adhesive [Tape] Rash  . Celebrex [Celecoxib] Hives  . Cymbalta [Duloxetine Hcl] Swelling  . Gabitril [Tiagabine] Swelling  . Keflex [Cephalexin] Nausea And Vomiting  . Lyrica [Pregabalin] Swelling  . Neurontin [Gabapentin] Swelling  . Nexium [Esomeprazole] Rash  . Nsaids Rash  . Penicillins Rash    Injection site reaction Has patient had a PCN reaction causing immediate rash, facial/tongue/throat swelling, SOB or lightheadedness with hypotension: No Has patient had a PCN reaction causing severe rash involving mucus membranes or skin necrosis: No Has patient had a PCN reaction that required hospitalization No Has  patient had a PCN reaction occurring within the last 10 years: No If all of the above answers are "NO", then may proceed with Cephalosporin use.   . Shrimp [Shellfish Allergy] Anaphylaxis  . Sulfa Antibiotics Nausea And Vomiting   Review of Systems:   Review of Systems  Constitutional: Positive for malaise/fatigue.  Respiratory: Positive for sputum production.   Cardiovascular: Negative for palpitations and leg swelling.  Gastrointestinal: Negative for  abdominal pain, constipation, diarrhea, nausea and vomiting.  Genitourinary: Negative for dysuria and urgency.  Musculoskeletal: Negative for joint pain.  Neurological: Negative for dizziness.  Psychiatric/Behavioral: Negative for depression, substance abuse and suicidal ideas. The patient is not nervous/anxious.    Vitals:   Vitals:   05/17/17 1351 05/17/17 1509  BP: 128/78   Pulse: (!) 102   Temp: 98.8 F (37.1 C)   TempSrc: Oral   SpO2: (!) 88% 96%  Weight: 173 lb (78.5 kg)   Height: 5' 4.5" (1.638 m)      Body mass index is 29.24 kg/m.  Physical Exam:   Physical Exam  Constitutional: She appears well-developed and well-nourished. No distress.  HENT:  Head: Normocephalic and atraumatic.  Eyes: Conjunctivae and EOM are normal. Pupils are equal, round, and reactive to light.  Neck: Normal range of motion. Neck supple.  Cardiovascular: Normal rate, regular rhythm and intact distal pulses.   Pulmonary/Chest: Effort normal. No respiratory distress. She has rhonchi in the right lower field and the left lower field.  Abdominal: Soft. Bowel sounds are normal.  Neurological: She is alert.  Skin: Skin is warm.  Psychiatric: She has a normal mood and affect. Her behavior is normal.  Nursing note and vitals reviewed.  EXAM: CHEST  2 VIEW  COMPARISON:  05/08/2017  FINDINGS: Cardiac shadow is stable. Stable interstitial changes are noted bilaterally. Patchy bibasilar infiltrates are noted left slightly greater than right. No sizable effusion is seen. No acute bony abnormality is noted. Postsurgical changes in the cervical spine are noted.  IMPRESSION: Bibasilar infiltrates as described. Followup PA and lateral chest X-ray is recommended in 3-4 weeks following trial of antibiotic therapy to ensure resolution and exclude underlying malignancy.  Assessment and Plan:   Eileen Wilkerson was seen today for acute visit and cough.  Diagnoses and all orders for this  visit:  Cough Comments: Worsened. Noticeable changes on CXR. She has been on Levaquin for > 2 weeks. Hx of multiple antibiotic allergies, so held off on CTX. No overt wheeze, SOB, or respiratory distress today, but I am concerned that this patient could decompensate quickly. Labs pending. Orders as below. Referral to Pulmonology. Red flags reviewed for going to the ER.  Orders: -     DG Chest 2 View -     CBC with Differential/Platelet -     Comprehensive metabolic panel -     Sedimentation rate -     C-reactive protein -     methylPREDNISolone acetate (DEPO-MEDROL) injection 80 mg; Inject 1 mL (80 mg total) into the muscle once. -     Ambulatory referral to Pulmonology -     levalbuterol (XOPENEX) nebulizer solution 1.25 mg; Take 1.25 mg by nebulization once. -     predniSONE (DELTASONE) 5 MG tablet; 6-5-4-3-2-1-off -     azithromycin (ZITHROMAX) 250 MG tablet; 2 po on day 1, then 1 po daily until gone -     Brain natriuretic peptide  Abnormal CXR Comments: With repeat recommended. To Pulmonolgy. Consider CT.   Marland Kitchen Reviewed expectations re:  course of current medical issues. . Discussed self-management of symptoms. . Outlined signs and symptoms indicating need for more acute intervention. . Patient verbalized understanding and all questions were answered. Marland Kitchen Health Maintenance issues including appropriate healthy diet, exercise, and smoking avoidance were discussed with patient. . See orders for this visit as documented in the electronic medical record. . Patient received an After Visit Summary.  CMA served as Education administrator during this visit. History, Physical, and Plan performed by medical provider. The above documentation has been reviewed and is accurate and complete. Eileen Wilkerson, D.O.  Eileen Deutscher, DO [place], [facility] [place] [facility] 05/17/2017  Future Appointments Date Time Provider [place]  06/27/2017 2:30 PM Bo Merino, MD [facility] None

## 2017-05-18 ENCOUNTER — Telehealth: Payer: Self-pay | Admitting: Family Medicine

## 2017-05-18 LAB — CBC WITH DIFFERENTIAL/PLATELET
Basophils Absolute: 0.1 10*3/uL (ref 0.0–0.1)
Basophils Relative: 0.7 % (ref 0.0–3.0)
Eosinophils Absolute: 0.1 10*3/uL (ref 0.0–0.7)
Eosinophils Relative: 0.5 % (ref 0.0–5.0)
HCT: 40.9 % (ref 36.0–46.0)
Hemoglobin: 13.4 g/dL (ref 12.0–15.0)
Lymphocytes Relative: 19.9 % (ref 12.0–46.0)
Lymphs Abs: 2.7 10*3/uL (ref 0.7–4.0)
MCHC: 32.9 g/dL (ref 30.0–36.0)
MCV: 100.2 fl — ABNORMAL HIGH (ref 78.0–100.0)
Monocytes Absolute: 1.1 10*3/uL — ABNORMAL HIGH (ref 0.1–1.0)
Monocytes Relative: 7.9 % (ref 3.0–12.0)
Neutro Abs: 9.6 10*3/uL — ABNORMAL HIGH (ref 1.4–7.7)
Neutrophils Relative %: 71 % (ref 43.0–77.0)
Platelets: 342 10*3/uL (ref 150.0–400.0)
RBC: 4.08 Mil/uL (ref 3.87–5.11)
RDW: 14.7 % (ref 11.5–15.5)
WBC: 13.5 10*3/uL — ABNORMAL HIGH (ref 4.0–10.5)

## 2017-05-18 LAB — BRAIN NATRIURETIC PEPTIDE: Brain Natriuretic Peptide: 16.8 pg/mL (ref <100)

## 2017-05-18 LAB — C-REACTIVE PROTEIN: CRP: 2 mg/dL (ref 0.5–20.0)

## 2017-05-18 LAB — COMPREHENSIVE METABOLIC PANEL
ALT: 16 U/L (ref 0–35)
AST: 27 U/L (ref 0–37)
Albumin: 4.3 g/dL (ref 3.5–5.2)
Alkaline Phosphatase: 59 U/L (ref 39–117)
BUN: 17 mg/dL (ref 6–23)
CO2: 32 mEq/L (ref 19–32)
Calcium: 10.8 mg/dL — ABNORMAL HIGH (ref 8.4–10.5)
Chloride: 97 mEq/L (ref 96–112)
Creatinine, Ser: 1.04 mg/dL (ref 0.40–1.20)
GFR: 53.83 mL/min — ABNORMAL LOW (ref >60.00)
Glucose, Bld: 129 mg/dL — ABNORMAL HIGH (ref 70–99)
Potassium: 3.3 mEq/L — ABNORMAL LOW (ref 3.5–5.1)
Sodium: 141 mEq/L (ref 135–145)
Total Bilirubin: 0.5 mg/dL (ref 0.2–1.2)
Total Protein: 8.6 g/dL — ABNORMAL HIGH (ref 6.0–8.3)

## 2017-05-18 LAB — SEDIMENTATION RATE: Sed Rate: 74 mm/hr — ABNORMAL HIGH (ref 0–30)

## 2017-05-18 NOTE — Telephone Encounter (Signed)
Please advise on results

## 2017-05-18 NOTE — Telephone Encounter (Signed)
Chest xray shows possible infection, she needs to continue the antibiotic and follow-up for a repeat chest xray in 3-4 weeks to make sure the changes have improved, otherwise she will need further imaging. She needs to keep her appointment with Dr. Melvyn Novas in pulmonology for 06/29/17 so they can discuss repeat xray results and any need for further imaging.

## 2017-05-18 NOTE — Telephone Encounter (Signed)
Patient calling to try and get result of chest XRAY.

## 2017-05-18 NOTE — Telephone Encounter (Signed)
Spoke to pt, told her Dr. Juleen China is out of the office so Inda Coke, [place] reviewed results and she said, Chest xray shows possible infection, she needs to continue the antibiotic and follow-up for a repeat chest xray in 3-4 weeks to make sure the changes have improved, otherwise she will need further imaging. She needs to keep her appointment with Dr. Melvyn Novas in pulmonology for 06/29/17 so they can discuss repeat xray results and any need for further imaging. Pt verbalized understanding.

## 2017-05-18 NOTE — Telephone Encounter (Signed)
Reviewed. Note for chart: Attempted to call patient before noon to review labs and CXR but she was "out." Will check in on patient on Monday as well.

## 2017-05-23 ENCOUNTER — Other Ambulatory Visit: Payer: Self-pay | Admitting: Rheumatology

## 2017-05-23 NOTE — Telephone Encounter (Signed)
Okay to refill the prescription as written. Dr. Koleen Nimrod has discussed this interaction with the patient and patient is aware and she is not having any problems: See below==> Reviewed patient's chart and noted she has been on these medications since 2012.  Letter was reviewed by Mr. Carlyon Shadow (see attached).  I counseled patient on the risk of seizures and serotonin syndrome with tramadol and Savella.  Patient confirms she is tolerating the medication well and denies any adverse effects.  Patient reports taking the tramadol TID.  I counseled patient that tramadol should be used as needed.  Patient voiced understanding.  Patient denies any questions or concerns regarding her medications at this time.

## 2017-05-23 NOTE — Telephone Encounter (Signed)
04/19/17 last visit  06/27/17 next visit  UDS and narcotic agreement signed  02/22/17   You advised patient to decrease her Tramadol to decrease chances of seratonin syndrome 03/26/17 she is also on [place] to refill Tramadol?  Change dose?  Currently you have her on one tid

## 2017-05-30 ENCOUNTER — Encounter: Payer: Self-pay | Admitting: Internal Medicine

## 2017-05-30 ENCOUNTER — Telehealth: Payer: Self-pay | Admitting: Internal Medicine

## 2017-05-30 ENCOUNTER — Ambulatory Visit (INDEPENDENT_AMBULATORY_CARE_PROVIDER_SITE_OTHER): Payer: Medicare Other | Admitting: Internal Medicine

## 2017-05-30 VITALS — BP 126/80 | HR 88 | Ht 64.0 in | Wt 173.8 lb

## 2017-05-30 DIAGNOSIS — Z8701 Personal history of pneumonia (recurrent): Secondary | ICD-10-CM

## 2017-05-30 DIAGNOSIS — R5381 Other malaise: Secondary | ICD-10-CM

## 2017-05-30 DIAGNOSIS — B348 Other viral infections of unspecified site: Secondary | ICD-10-CM | POA: Insufficient documentation

## 2017-05-30 DIAGNOSIS — B338 Other specified viral diseases: Secondary | ICD-10-CM

## 2017-05-30 DIAGNOSIS — D849 Immunodeficiency, unspecified: Secondary | ICD-10-CM

## 2017-05-30 DIAGNOSIS — D899 Disorder involving the immune mechanism, unspecified: Secondary | ICD-10-CM

## 2017-05-30 NOTE — Progress Notes (Signed)
   Subjective:    Patient ID: Eileen Wilkerson, female    DOB: [birth year], 83 y.o.   MRN: [id number]  HPI    Review of Systems  Constitutional: Negative for fever and unexpected weight change.  HENT: Positive for congestion. Negative for dental problem, ear pain, nosebleeds, postnasal drip, rhinorrhea, sinus pressure, sneezing, sore throat and trouble swallowing.   Eyes: Negative for redness and itching.  Respiratory: Positive for cough, chest tightness, shortness of breath and wheezing.   Cardiovascular: Negative for palpitations and leg swelling.  Gastrointestinal: Negative for nausea and vomiting.  Genitourinary: Negative for dysuria.  Musculoskeletal: Negative for joint swelling.  Skin: Negative for rash.  Allergic/Immunologic: Negative.  Negative for environmental allergies, food allergies and immunocompromised state.  Neurological: Negative for headaches.  Hematological: Bruises/bleeds easily.  Psychiatric/Behavioral: Negative for dysphoric mood. The patient is not nervous/anxious.        Objective:   Physical Exam        Assessment & Plan:

## 2017-05-30 NOTE — Telephone Encounter (Signed)
Patient advised to discontinue her MTX until her infection is clear and she has clearance from Dr. Chase Caller. Patient verbalized understanding.

## 2017-05-30 NOTE — Patient Instructions (Addendum)
ICD-10-CM   1. Parainfluenza virus infection B33.8   2. History of bacterial pneumonia Z87.01   3. Physical deconditioning R53.81   4. Immunosuppressed status ([place]) D89.9     I think mid may 2018 you had PIV-3 infection for which you got inpatient treatment Then despite preventative antibiotic levaquin you still ended up getting secondar bacterial pneumonia 05/17/17  This bacterial pneumonia 05/17/17 has been treated successfully as outpatient with Z PAK Now you have physical deconditioning because of above. THis makes you feel short of breath and tired However, you are improving  PLAN  - hold off methotrexate or reduce dose if possible - will send messsage to Dr Bo Merino  - ok for prednisone and plaquenil - repeat cxr in 6 weeks  FOllowup Return in 6 weeks with see APP or me but after repeat cxr You will need CT chest +/- echo if things are not improving

## 2017-05-30 NOTE — Progress Notes (Signed)
Subjective:     Patient ID: Eileen Wilkerson, female   DOB: 01/30/1932, 83 y.o.   MRN: [id number]  PCP Briscoe Deutscher, DO   HPI   IOV 05/30/2017  Chief Complaint  Patient presents with  . CONSULT COUGH    Referring doctor is Dr. Brain Hilts patient states that her doctor wanted her to be seen by  pulmonary do to she still has a infection in her lungs   83 year old female with rheumatoid arthritis on methotrexate since 2009 and chronic prednisone since then [partly due to relative adrenal insufficiency] . At baseline she did not suffer any shortness of breath cough or wheezing. Then on 04/25/2017 she was admitted for 2 days with parainfluenza virus 3 infection. White count at at admission was 10,900 with a relatively clear chest x-ray that I personally visualized other than some basal atelectasis. At discharge her white count was 9100. She was given 10 day Levaquin presumably due to prophylaxis against secondary bacterial infection. When she followed up with a primary care physician on 05/08/2017 white count was 12,300 and her Levaquin was extended by 4 days. A chest x-ray done at this time was clear that I personally visualized. Around this time she also stopped her methotrexate. She then returned on 05/17/2017 to see her primary care physician. At this time she was reporting increased cough and fatigue. She was noticed to be 88% walking inside the office on room air but quickly rebalance her normal pulse ox after resting. White count at this time was 13,000. She had a repeat chest x-ray and she had new bilateral right greater than left lower lobe infiltrates. She was given Z-Pak and also Pred burst. After this she says she is continued to improve. Her fatigue is significantly better. Although she is working with home physical. Shortness of breath is improved. Cough is improved. She does have some mild dyspnea at night. Says effo intolerancest with PT ordered rt tolerance is improving. OVerall  though improved still fatigued and short of breath. sHe is now back on Q-Wednesday methotrexate  Walking desaturation test on 05/30/2017 185 feet x 3 laps on RA - did only 2 laps and stopped due to dyspnea and hip pain:  did NOT desaturate. Rest pulse ox was 99%, final pulse ox was 98%. HR response 88/min at rest to 109/min at peak exertion.  No vomiting since admit mid may 22018   last echocardiogram was in June 2017 with normal ejection fraction and possible diastolic dysfunction   has a past medical history of Adrenal failure ([place]); Arthritis; Cataract; Fibromyalgia (2008); Osteoporosis; and RA (rheumatoid arthritis) ([place]).   reports that she has quit smoking. She has never used smokeless tobacco.  Past Surgical History:  Procedure Laterality Date  . BACK SURGERY    . BILATERAL CARPAL TUNNEL RELEASE    . CATARACT EXTRACTION, BILATERAL  2004  . CERVICAL FUSION  2011,2010,2008  . HEEL SPUR SURGERY  2004  . ROTATOR CUFF REPAIR    . TONSILLECTOMY AND ADENOIDECTOMY  1947    Allergies  Allergen Reactions  . Adhesive [Tape] Rash  . Celebrex [Celecoxib] Hives  . Cymbalta [Duloxetine Hcl] Swelling  . Gabitril [Tiagabine] Swelling  . Keflex [Cephalexin] Nausea And Vomiting  . Lyrica [Pregabalin] Swelling  . Neurontin [Gabapentin] Swelling  . Nexium [Esomeprazole] Rash  . Nsaids Rash  . Penicillins Rash    Injection site reaction Has patient had a PCN reaction causing immediate rash, facial/tongue/throat swelling, SOB or lightheadedness with hypotension: No Has  patient had a PCN reaction causing severe rash involving mucus membranes or skin necrosis: No Has patient had a PCN reaction that required hospitalization No Has patient had a PCN reaction occurring within the last 10 years: No If all of the above answers are "NO", then may proceed with Cephalosporin use.   . Shrimp [Shellfish Allergy] Anaphylaxis  . Sulfa Antibiotics Nausea And Vomiting    Immunization History   Administered Date(s) Administered  . Influenza,inj,Quad PF,36+ Mos 08/11/2016    Family History  Problem Relation Age of Onset  . Breast cancer Mother   . Diabetes Father      Current Outpatient Prescriptions:  .  acetaminophen (TYLENOL) 650 MG CR tablet, Take 1,300 mg by mouth 2 (two) times daily., Disp: , Rfl:  .  atorvastatin (LIPITOR) 20 MG tablet, Take 20 mg by mouth At bedtime., Disp: , Rfl:  .  azithromycin (ZITHROMAX) 250 MG tablet, 2 po on day 1, then 1 po daily until gone, Disp: 6 tablet, Rfl: 0 .  Biotin (BIOTIN MAXIMUM STRENGTH) 10 MG TABS, Take 10 mg by mouth daily., Disp: , Rfl:  .  Cholecalciferol (VITAMIN D3) 5000 units CAPS, Take 5,000 Units by mouth daily., Disp: , Rfl:  .  fluticasone (FLONASE) 50 MCG/ACT nasal spray, , Disp: , Rfl:  .  folic acid (FOLVITE) 1 MG tablet, TAKE TWO TABLETS BY MOUTH ONCE DAILY, Disp: 180 tablet, Rfl: 3 .  furosemide (LASIX) 40 MG tablet, Take 60 mg by mouth Daily., Disp: , Rfl:  .  guaiFENesin (MUCINEX) 600 MG 12 hr tablet, Take 1 tablet (600 mg total) by mouth 2 (two) times daily., Disp: , Rfl:  .  hydroxychloroquine (PLAQUENIL) 200 MG tablet, TAKE 1 TABLET BY MOUTH ONCE DAILY, Disp: 90 tablet, Rfl: 0 .  Methotrexate, PF, (OTREXUP) 15 MG/0.4ML SOAJ, INJECT ONE PEN (15 MG) SUBCUTANEOUSLY ONCE EVERY WEEK. STORE AT ROOM TEMPERATURE BETWEEN 47 - 31 DEGREES F., Disp: 12 pen, Rfl: 0 .  predniSONE (DELTASONE) 5 MG tablet, 6-5-4-3-2-1-off, Disp: 21 tablet, Rfl: 0 .  PREVACID 24HR 15 MG capsule, Take 15 mg by mouth Daily., Disp: , Rfl:  .  SAVELLA 50 MG TABS, Take 50 mg by mouth 2 (two) times daily. , Disp: , Rfl:  .  traMADol (ULTRAM) 50 MG tablet, TAKE 1 TABLET BY MOUTH THREE TIMES DAILY, Disp: 90 tablet, Rfl: 0 .  Zoledronic Acid (RECLAST IV), Inject into the vein., Disp: , Rfl:    Review of Systems     Objective:   Physical Exam  Constitutional: She is oriented to person, place, and time. She appears well-developed and well-nourished.  No distress.  HENT:  Head: Normocephalic and atraumatic.  Right Ear: External ear normal.  Left Ear: External ear normal.  Mouth/Throat: Oropharynx is clear and moist. No oropharyngeal exudate.  HOH Restricted neck movement due to RA/OA  Eyes: Conjunctivae and EOM are normal. Pupils are equal, round, and reactive to light. Right eye exhibits no discharge. Left eye exhibits no discharge. No scleral icterus.  Neck: Normal range of motion. Neck supple. No JVD present. No tracheal deviation present. No thyromegaly present.  Cardiovascular: Normal rate, regular rhythm, normal heart sounds and intact distal pulses.  Exam reveals no gallop and no friction rub.   No murmur heard. Pulmonary/Chest: Effort normal. No respiratory distress. She has no wheezes. She has rales. She exhibits no tenderness.  Abdominal: Soft. Bowel sounds are normal. She exhibits no distension and no mass. There is no tenderness. There is  no rebound and no guarding.  Musculoskeletal: Normal range of motion. She exhibits no edema or tenderness.  Varicose veins +  Lymphadenopathy:    She has no cervical adenopathy.  Neurological: She is alert and oriented to person, place, and time. She has normal reflexes. No cranial nerve deficit. She exhibits normal muscle tone. Coordination normal.  Skin: Skin is warm and dry. No rash noted. She is not diaphoretic. No erythema. No pallor.  Psychiatric: She has a normal mood and affect. Her behavior is normal. Judgment and thought content normal.  Vitals reviewed.  Vitals:   05/30/17 1049 05/30/17 1050  BP:  126/80  Pulse:  88  SpO2:  100%  Weight: 173 lb 12.8 oz (78.8 kg)   Height: 5\' 4"  (1.626 m)     Estimated body mass index is 29.83 kg/m as calculated from the following:   Height as of this encounter: 5\' 4"  (1.626 m).   Weight as of this encounter: 173 lb 12.8 oz (78.8 kg).     Assessment:       ICD-10-CM   1. Parainfluenza virus infection B33.8   2. History of bacterial  pneumonia Z87.01   3. Physical deconditioning R53.81   4. Immunosuppressed status ([place]) D89.9        Plan:      I think mid may 2018 you had PIV-3 infection for which you got inpatient treatment Then despite preventative antibiotic levaquin you still ended up getting secondar bacterial pneumonia 05/17/17  This bacterial pneumonia 05/17/17 has been treated successfully as outpatient with Z PAK Now you have physical deconditioning because of above. THis makes you feel short of breath and tired However, you are improving  PLAN  - hold off methotrexate or reduce dose if possible - will send messsage to Dr Bo Merino  - ok for prednisone and plaquenil - repeat cxr in 6 weeks  FOllowup Return in 6 weeks with see APP or me but after repeat cxr You will need CT chest +/- echo if things are not improving   Dr. Brand Males, M.D., [facility].C.P Pulmonary and Critical Care Medicine Staff Physician [facility] Pager: [phone], If no answer or between  15:00h - 7:00h: call [phone]  05/30/2017 11:26 AM

## 2017-05-30 NOTE — Addendum Note (Signed)
Addended by: Chase Picket A on: 05/30/2017 11:31 AM   Modules accepted: Orders

## 2017-05-30 NOTE — Telephone Encounter (Signed)
Eileen Wilkerson, please ask patient to discontinue methotrexate. She may restart methotrexate once her infection is cleared up and she gets clearance from Dr. Chase Caller.

## 2017-05-30 NOTE — Telephone Encounter (Signed)
HI Eileen Wilkerson  She had viral and then secondary bacterial pneumonia - any chance she can come off or reduce methotrexate temporarily. SHe told me if she stops it RA pain gets significantly worse . I amn ok with lower dose atleast next 8 weeks  Thanks  Dr. Brand Males, M.D., [facility].C.P Pulmonary and Critical Care Medicine Staff Physician [place] [facility] Pager: [phone], If no answer or between  15:00h - 7:00h: call [phone]  05/30/2017 11:28 AM

## 2017-06-01 ENCOUNTER — Other Ambulatory Visit: Payer: Self-pay | Admitting: Rheumatology

## 2017-06-01 NOTE — Telephone Encounter (Signed)
Last Visit: 04/19/17 Next visit: 06/27/17  Okay to refill Methocarbamol?

## 2017-06-01 NOTE — Telephone Encounter (Signed)
ok 

## 2017-06-05 ENCOUNTER — Ambulatory Visit (INDEPENDENT_AMBULATORY_CARE_PROVIDER_SITE_OTHER): Payer: Medicare Other

## 2017-06-05 ENCOUNTER — Ambulatory Visit (INDEPENDENT_AMBULATORY_CARE_PROVIDER_SITE_OTHER): Payer: Medicare Other | Admitting: Orthopaedic Surgery

## 2017-06-05 ENCOUNTER — Encounter (INDEPENDENT_AMBULATORY_CARE_PROVIDER_SITE_OTHER): Payer: Self-pay | Admitting: Orthopaedic Surgery

## 2017-06-05 DIAGNOSIS — M25571 Pain in right ankle and joints of right foot: Secondary | ICD-10-CM

## 2017-06-05 NOTE — Progress Notes (Signed)
Office Visit Note   Patient: Eileen Wilkerson           Date of Birth: August 03, 1932           MRN: [id number] Visit Date: 06/05/2017              Requested by: Briscoe Deutscher, [address] [place], [place] [address] PCP: Briscoe Deutscher, DO   Assessment & Plan: Visit Diagnoses:  1. Pain in right ankle and joints of right foot     Plan: Overall impression is right ankle sprain. Cam boot was given for support. Wean as tolerated. NSAIDs as needed. Follow-up with me as needed.  Follow-Up Instructions: Return if symptoms worsen or fail to improve.   Orders:  Orders Placed This Encounter  Procedures  . XR Ankle Complete Right   No orders of the defined types were placed in this encounter.     Procedures: No procedures performed   Clinical Data: No additional findings.   Subjective: Chief Complaint  Patient presents with  . Right Ankle - Injury    Had a fall on 06/04/17    Patient is a 44-year-old female comes in with right ankle injury for a day. She sprained her ankle when she fell. She endorses pain on the lateral aspect of the ankle. She denies any radiation of pain. Pain is worse with activity.    Review of Systems  Constitutional: Negative.   HENT: Negative.   Eyes: Negative.   Respiratory: Negative.   Cardiovascular: Negative.   Endocrine: Negative.   Musculoskeletal: Negative.   Neurological: Negative.   Hematological: Negative.   Psychiatric/Behavioral: Negative.   All other systems reviewed and are negative.    Objective: Vital Signs: There were no vitals taken for this visit.  Physical Exam  Constitutional: She is oriented to person, place, and time. She appears well-developed and well-nourished.  HENT:  Head: Normocephalic and atraumatic.  Eyes: EOM are normal.  Neck: Neck supple.  Pulmonary/Chest: Effort normal.  Abdominal: Soft.  Neurological: She is alert and oriented to person, place, and time.  Skin: Skin is warm. Capillary  refill takes less than 2 seconds.  Psychiatric: She has a normal mood and affect. Her behavior is normal. Judgment and thought content normal.  Nursing note and vitals reviewed.   Ortho Exam Right ankle exam shows tenderness along the distal fibula. His ankle joint is stable. Subtalar motion is normal. Medial ankle is nontender. Specialty Comments:  No specialty comments available.  Imaging: Xr Ankle Complete Right  Result Date: 06/05/2017 No acute findings    PMFS History: Patient Active Problem List   Diagnosis Date Noted  . Parainfluenza virus infection 05/30/2017  . History of bacterial pneumonia 05/30/2017  . Physical deconditioning 05/30/2017  . Immunosuppressed status ([place]) 05/30/2017  . Abnormal CXR 05/17/2017  . Sepsis ([place]) 04/25/2017  . PNA (pneumonia) 04/25/2017  . Hypokalemia 04/25/2017  . Nondisplaced fracture of fifth right metatarsal bone with routine healing 04/12/2017  . Fibromyalgia 12/18/2016  . DJD (degenerative joint disease), cervical 12/18/2016  . Spondylosis of lumbar region without myelopathy or radiculopathy 12/18/2016  . Other fatigue 12/18/2016  . Trigger finger, right middle finger 12/18/2016  . Primary osteoarthritis of both hands 12/18/2016  . Primary osteoarthritis of both feet 12/18/2016  . Primary osteoarthritis of both knees 12/18/2016  . History of gastroesophageal reflux (GERD) 12/18/2016  . Age-related osteoporosis without current pathological fracture 12/18/2016  . High risk medication use 12/18/2016  . Rheumatoid arthritis ([place])  12/28/2015  . Long term current use of systemic steroids 12/28/2015  . Lymphocytosis 11/13/2012   Past Medical History:  Diagnosis Date  . Adrenal failure ([place])   . Arthritis   . Cataract   . Fibromyalgia 2008  . Osteoporosis   . RA (rheumatoid arthritis) ([facility])     Family History  Problem Relation Age of Onset  . Breast cancer Mother   . Diabetes Father     Past Surgical History:  Procedure  Laterality Date  . BACK SURGERY    . BILATERAL CARPAL TUNNEL RELEASE    . CATARACT EXTRACTION, BILATERAL  2004  . CERVICAL FUSION  2011,2010,2008  . HEEL SPUR SURGERY  2004  . ROTATOR CUFF REPAIR    . TONSILLECTOMY AND ADENOIDECTOMY  1947   Social History   Occupational History  . Not on file.   Social History Main Topics  . Smoking status: Former Research scientist (life sciences)  . Smokeless tobacco: Never Used  . Alcohol use No  . Drug use: No  . Sexual activity: Not on file

## 2017-06-06 ENCOUNTER — Telehealth: Payer: Self-pay | Admitting: Family Medicine

## 2017-06-06 DIAGNOSIS — M797 Fibromyalgia: Secondary | ICD-10-CM

## 2017-06-06 NOTE — Telephone Encounter (Signed)
Gwinda Passe (physical therapist for [facility]) called to advise that the patient fell Monday night and saw an orthopedic doctor who told her she had a R ankle fracture and is in a walking boot for 2 weeks. PT would like to extend their services to have a verbal order for: 0x week for 1 week 2x week for 2 weeks 1x week for 1 week  Call [facility] to advise and give verbal orders at the number provided.

## 2017-06-07 NOTE — Telephone Encounter (Signed)
Spoke with [facility] and gave verbal orders for PT. When speaking with Gwinda Passe she stated that the patient needed a walker as well. Per Dr. Juleen China I have faxed over an order for the Walker to [place] at [facility].

## 2017-06-19 ENCOUNTER — Ambulatory Visit: Payer: Medicare Other | Admitting: Family Medicine

## 2017-06-20 ENCOUNTER — Ambulatory Visit (INDEPENDENT_AMBULATORY_CARE_PROVIDER_SITE_OTHER): Payer: Medicare Other | Admitting: Physician Assistant

## 2017-06-20 ENCOUNTER — Encounter (INDEPENDENT_AMBULATORY_CARE_PROVIDER_SITE_OTHER): Payer: Self-pay | Admitting: Physician Assistant

## 2017-06-20 ENCOUNTER — Ambulatory Visit (INDEPENDENT_AMBULATORY_CARE_PROVIDER_SITE_OTHER): Payer: Medicare Other

## 2017-06-20 ENCOUNTER — Other Ambulatory Visit: Payer: Self-pay | Admitting: Rheumatology

## 2017-06-20 DIAGNOSIS — M7062 Trochanteric bursitis, left hip: Secondary | ICD-10-CM

## 2017-06-20 DIAGNOSIS — S93401D Sprain of unspecified ligament of right ankle, subsequent encounter: Secondary | ICD-10-CM | POA: Diagnosis not present

## 2017-06-20 MED ORDER — METHYLPREDNISOLONE ACETATE 40 MG/ML IJ SUSP
40.0000 mg | INTRAMUSCULAR | Status: AC | PRN
  Administered 2017-06-20: 40 mg via INTRA_ARTICULAR

## 2017-06-20 MED ORDER — LIDOCAINE HCL 2 % IJ SOLN
4.0000 mL | INTRAMUSCULAR | Status: AC | PRN
  Administered 2017-06-20: 4 mL

## 2017-06-20 NOTE — Progress Notes (Signed)
Office Visit Note   Patient: Eileen Wilkerson           Date of Birth: 10-Dec-1932           MRN: [id number] Visit Date: 06/20/2017              Requested by: Eileen Wilkerson, [address] [place], [place] [address] PCP: Eileen Deutscher, DO   Assessment & Plan: Visit Diagnoses:  1. Sprain of right ankle, unspecified ligament, subsequent encounter   2. Trochanteric bursitis, left hip     Plan: Explained to patient that it may take up to 3 months for the swelling from the ankle sprain to totally resolve. Recommended compression hose. Elevation. In regards to the left hip trochanteric bursitis she knows to do IT band stretching exercises that she's dealt with this for a long time. See her back on an as-needed basis if there is any questions or concerns.  Follow-Up Instructions: No Follow-up on file.   Orders:  Orders Placed This Encounter  Procedures  . Large Joint Injection/Arthrocentesis  . XR Ankle Complete Right   No orders of the defined types were placed in this encounter.     Procedures: Large Joint Inj Date/Time: 06/20/2017 5:22 PM Performed by: Eileen Wilkerson Authorized by: Eileen Wilkerson   Consent Given by:  Patient Indications:  Pain Location:  Hip Site:  L greater trochanter Needle Size:  22 G Needle Length:  1.5 inches Approach:  Lateral Ultrasound Guidance: No   Fluoroscopic Guidance: No   Arthrogram: No   Medications:  40 mg methylPREDNISolone acetate 40 MG/ML; 4 mL lidocaine 2 % Aspiration Attempted: No   Patient tolerance:  Patient tolerated the procedure well with no immediate complications     Clinical Data: No additional findings.   Subjective: Chief Complaint  Patient presents with  . Right Ankle - Pain    HPI [name] returns today due to the fact that her right ankle has remained swollen and painful. His she said last seen by Dr. Wyline Wilkerson on 06/05/2017 diagnosed with an ankle sprain. She is requesting radiographs again  of the ankle and she was told that she may have a cold fracture. She is also having pain in her left hip she feels is due to infection and walking with the cam walker boot. She's had no new injury to the foot or ankle. She has a history of trochanteric bursitis she's been treated for with cortisone injections over the years and is requesting cortisone injection in the left hip today. Review of Systems Denies any mechanical symptoms of the right ankle. Please see history of present illness otherwise negative Objective: Vital Signs: There were no vitals taken for this visit.  Physical Exam  Constitutional: She appears well-developed and well-nourished. No distress.  Cardiovascular: Intact distal pulses.   Skin: She is not diaphoretic.  Psychiatric: She has a normal mood and affect. Her behavior is normal.    Ortho Exam Left hip she has good range of motion of the hip without significant pain. She has tenderness over the trochanteric region of both hips. Bilateral ankles was pedal pulses are 2+. She has slight swelling globally of the right ankle compared to left. There is no rashes skin lesions ulcerations or ecchymosis of either ankle. Right ankle good dorsiflexion plantar flexion without pain. Inversion eversion against resistance right foot. Causes no discomfort she has 5 out of 5 strengths. Nontender over Achilles. Right calf supple nontender. She has tenderness over  the proximal tibia also mid shaft of the right midshaft tibia. Specialty Comments:  No specialty comments available.  Imaging: Xr Ankle Complete Right  Result Date: 06/20/2017 Right ankle 3 views: Talus located within the ankle mortise no diastases. Well healed fifth metatarsal fracture. No other bony abnormalities acute fractures noted.    PMFS History: Patient Active Problem List   Diagnosis Date Noted  . Parainfluenza virus infection 05/30/2017  . History of bacterial pneumonia 05/30/2017  . Physical deconditioning  05/30/2017  . Immunosuppressed status ([place]) 05/30/2017  . Abnormal CXR 05/17/2017  . Sepsis ([place]) 04/25/2017  . PNA (pneumonia) 04/25/2017  . Hypokalemia 04/25/2017  . Nondisplaced fracture of fifth right metatarsal bone with routine healing 04/12/2017  . Fibromyalgia 12/18/2016  . DJD (degenerative joint disease), cervical 12/18/2016  . Spondylosis of lumbar region without myelopathy or radiculopathy 12/18/2016  . Other fatigue 12/18/2016  . Trigger finger, right middle finger 12/18/2016  . Primary osteoarthritis of both hands 12/18/2016  . Primary osteoarthritis of both feet 12/18/2016  . Primary osteoarthritis of both knees 12/18/2016  . History of gastroesophageal reflux (GERD) 12/18/2016  . Age-related osteoporosis without current pathological fracture 12/18/2016  . High risk medication use 12/18/2016  . Rheumatoid arthritis ([place]) 12/28/2015  . Long term current use of systemic steroids 12/28/2015  . Lymphocytosis 11/13/2012   Past Medical History:  Diagnosis Date  . Adrenal failure ([place])   . Arthritis   . Cataract   . Fibromyalgia 2008  . Osteoporosis   . RA (rheumatoid arthritis) ([facility])     Family History  Problem Relation Age of Onset  . Breast cancer Mother   . Diabetes Father     Past Surgical History:  Procedure Laterality Date  . BACK SURGERY    . BILATERAL CARPAL TUNNEL RELEASE    . CATARACT EXTRACTION, BILATERAL  2004  . CERVICAL FUSION  2011,2010,2008  . HEEL SPUR SURGERY  2004  . ROTATOR CUFF REPAIR    . TONSILLECTOMY AND ADENOIDECTOMY  1947   Social History   Occupational History  . Not on file.   Social History Main Topics  . Smoking status: Former Research scientist (life sciences)  . Smokeless tobacco: Never Used  . Alcohol use No  . Drug use: No  . Sexual activity: Not on file

## 2017-06-21 NOTE — Telephone Encounter (Signed)
Ok to fill bid prn

## 2017-06-21 NOTE — Telephone Encounter (Signed)
04/19/17 last visit  06/27/17 next visit  02/22/17 UDS and Narcotic agreement signed  Ok to refill Tramadol ?  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 (written for tid prn) Ok to refill Tramadol ?

## 2017-06-26 ENCOUNTER — Ambulatory Visit (INDEPENDENT_AMBULATORY_CARE_PROVIDER_SITE_OTHER): Payer: Medicare Other | Admitting: Family Medicine

## 2017-06-26 ENCOUNTER — Encounter: Payer: Self-pay | Admitting: Family Medicine

## 2017-06-26 ENCOUNTER — Telehealth: Payer: Self-pay

## 2017-06-26 VITALS — BP 128/84 | HR 99 | Temp 97.8°F | Ht 64.0 in | Wt 172.2 lb

## 2017-06-26 DIAGNOSIS — R600 Localized edema: Secondary | ICD-10-CM | POA: Diagnosis not present

## 2017-06-26 DIAGNOSIS — E2839 Other primary ovarian failure: Secondary | ICD-10-CM | POA: Diagnosis not present

## 2017-06-26 DIAGNOSIS — M797 Fibromyalgia: Secondary | ICD-10-CM | POA: Diagnosis not present

## 2017-06-26 DIAGNOSIS — Z23 Encounter for immunization: Secondary | ICD-10-CM

## 2017-06-26 DIAGNOSIS — M810 Age-related osteoporosis without current pathological fracture: Secondary | ICD-10-CM

## 2017-06-26 DIAGNOSIS — R300 Dysuria: Secondary | ICD-10-CM

## 2017-06-26 LAB — POCT URINALYSIS DIPSTICK
Bilirubin, UA: NEGATIVE
Blood, UA: 10
Glucose, UA: NEGATIVE
Ketones, UA: NEGATIVE
Leukocytes, UA: NEGATIVE
Nitrite, UA: NEGATIVE
Protein, UA: NEGATIVE
Spec Grav, UA: 1.015 (ref 1.010–1.025)
Urobilinogen, UA: 0.2 E.U./dL
pH, UA: 6.5 (ref 5.0–8.0)

## 2017-06-26 MED ORDER — FUROSEMIDE 80 MG PO TABS: 80.0000 mg | ORAL_TABLET | Freq: Every day | ORAL | 5 refills | Status: DC

## 2017-06-26 MED ORDER — PNEUMOCOCCAL 13-VAL CONJ VACC IM SUSP: 0.5000 mL | INTRAMUSCULAR | Status: DC

## 2017-06-26 MED ORDER — POTASSIUM CHLORIDE CRYS ER 20 MEQ PO TBCR: 20.0000 meq | EXTENDED_RELEASE_TABLET | Freq: Every day | ORAL | 11 refills | Status: DC

## 2017-06-26 MED ORDER — MILNACIPRAN HCL 50 MG PO TABS: 50.0000 mg | ORAL_TABLET | Freq: Two times a day (BID) | ORAL | 3 refills | Status: DC

## 2017-06-26 NOTE — Telephone Encounter (Signed)
Patient needs to be initiated on osteoporosis medication Reclast.  Could you look into this please?

## 2017-06-26 NOTE — Progress Notes (Signed)
Eileen Wilkerson is a 83 y.o. female is here for follow up.  History of Present Illness:   Water quality scientist, CMA, acting as scribe for Dr. Juleen China.  HPI:  Patient comes in today for follow up.  Her cough has resolved.  Would like to discuss Reclast today.  Also would like refills on her medications, including Savella.  States she is doing well.  Complains of some intermittent burning with urination.  Asks about scheduling bone density testing and would like Prevnar vaccine today.  Health Maintenance Due  Topic Date Due  . TETANUS/TDAP  09/07/1953  . DEXA SCAN  09/08/1999   Depression screen PHQ 2/9 05/08/2017  Decreased Interest 0  Down, Depressed, Hopeless 0  PHQ - 2 Score 0   PMHx, SurgHx, SocialHx, FamHx, Medications, and Allergies were reviewed in the Visit Navigator and updated as appropriate.   Patient Active Problem List   Diagnosis Date Noted  . Bilateral lower extremity edema 06/30/2017  . Physical deconditioning 05/30/2017  . Immunosuppressed status ([place]) 05/30/2017  . Fibromyalgia 12/18/2016  . DJD (degenerative joint disease), cervical 12/18/2016  . Spondylosis of lumbar region without myelopathy or radiculopathy 12/18/2016  . Trigger finger, right middle finger 12/18/2016  . Primary osteoarthritis of both hands 12/18/2016  . Primary osteoarthritis of both feet 12/18/2016  . Primary osteoarthritis of both knees 12/18/2016  . GERD (gastroesophageal reflux disease) 12/18/2016  . Age-related osteoporosis without current pathological fracture 12/18/2016  . High risk medication use 12/18/2016  . Rheumatoid arthritis ([place]) 12/28/2015  . Long term current use of systemic steroids 12/28/2015  . Lymphocytosis 11/13/2012   Social History  Substance Use Topics  . Smoking status: Former Smoker    Packs/day: 0.75    Years: 10.00    Types: Cigarettes    Quit date: 1968  . Smokeless tobacco: Never Used  . Alcohol use No   Current Medications and Allergies:   .   acetaminophen (TYLENOL) 650 MG CR tablet, Take 1,300 mg by mouth 2 (two) times daily., Disp: , Rfl:  .  atorvastatin (LIPITOR) 20 MG tablet, Take 20 mg by mouth At bedtime., Disp: , Rfl:  .  Biotin (BIOTIN MAXIMUM STRENGTH) 10 MG TABS, Take 10 mg by mouth daily., Disp: , Rfl:  .  Cholecalciferol (VITAMIN D3) 5000 units CAPS, Take 5,000 Units by mouth daily., Disp: , Rfl:  .  fluticasone (FLONASE) 50 MCG/ACT nasal spray, , Disp: , Rfl:  .  folic acid (FOLVITE) 1 MG tablet, TAKE TWO TABLETS BY MOUTH ONCE DAILY, Disp: 180 tablet, Rfl: 3 .  furosemide (LASIX) 40 MG tablet, Take 60 mg by mouth Daily., Disp: , Rfl:  .  guaiFENesin (MUCINEX) 600 MG 12 hr tablet, Take 1 tablet (600 mg total) by mouth 2 (two) times daily., Disp: , Rfl:  .  hydroxychloroquine (PLAQUENIL) 200 MG tablet, TAKE 1 TABLET BY MOUTH ONCE DAILY, Disp: 90 tablet, Rfl: 0 .  methocarbamol (ROBAXIN) 500 MG tablet, TAKE 1 TABLET BY MOUTH EVERY 6 TO 8 HOURS AS NEEDED FOR PAIN, Disp: 90 tablet, Rfl: 2 .  predniSONE (DELTASONE) 5 MG tablet, Take 5 mg by mouth daily with breakfast., Disp: , Rfl:  .  PREVACID 24HR 15 MG capsule, Take 15 mg by mouth Daily., Disp: , Rfl:  .  SAVELLA 50 MG TABS, Take 50 mg by mouth 2 (two) times daily. , Disp: , Rfl:  .  traMADol (ULTRAM) 50 MG tablet, Take 1 tablet (50 mg total) by mouth 2 (two)  times daily. prn, Disp: 60 tablet, Rfl: 0 .  Zoledronic Acid (RECLAST IV), Inject into the vein., Disp: , Rfl:   Allergies  Allergen Reactions  . Adhesive [Tape] Rash  . Celebrex [Celecoxib] Hives  . Ciprofibrate Nausea Only  . Cymbalta [Duloxetine Hcl] Swelling  . Gabitril [Tiagabine] Swelling  . Keflex [Cephalexin] Nausea And Vomiting  . Lyrica [Pregabalin] Swelling  . Neurontin [Gabapentin] Swelling  . Nexium [Esomeprazole] Rash  . Nsaids Rash  . Penicillins Rash    Injection site reaction Has patient had a PCN reaction causing immediate rash, facial/tongue/throat swelling, SOB or lightheadedness with  hypotension: No Has patient had a PCN reaction causing severe rash involving mucus membranes or skin necrosis: No Has patient had a PCN reaction that required hospitalization No Has patient had a PCN reaction occurring within the last 10 years: No If all of the above answers are "NO", then may proceed with Cephalosporin use.   . Shrimp [Shellfish Allergy] Anaphylaxis  . Sulfa Antibiotics Nausea And Vomiting  . Claritin-D 12 Hour [Loratadine-Pseudoephedrine Er] Anxiety   Review of Systems   Pertinent items are noted in the HPI. Otherwise, ROS is negative.  Vitals:   Vitals:   06/26/17 1310  BP: 128/84  Pulse: 99  Temp: 97.8 F (36.6 C)  TempSrc: Oral  SpO2: 97%  Weight: 172 lb 3.2 oz (78.1 kg)  Height: 5\' 4"  (1.626 m)     Body mass index is 29.56 kg/m.   Physical Exam:   Physical Exam  Constitutional: She appears well-developed and well-nourished. No distress.  HENT:  Head: Normocephalic and atraumatic.  Eyes: Pupils are equal, round, and reactive to light. EOM are normal.  Neck: Normal range of motion. Neck supple.  Cardiovascular: Normal rate, regular rhythm, normal heart sounds and intact distal pulses.   Pulmonary/Chest: Effort normal and breath sounds normal.  Abdominal: Soft. Bowel sounds are normal. She exhibits no distension. There is no tenderness. There is no guarding.  Skin: Skin is warm.  Psychiatric: She has a normal mood and affect. Her behavior is normal.  Nursing note and vitals reviewed.    Results for orders placed or performed in visit on 06/26/17  POCT Urinalysis Dipstick  Result Value Ref Range   Color, UA Yellow    Clarity, UA Clear    Glucose, UA Negative    Bilirubin, UA Negative    Ketones, UA Negative    Spec Grav, UA 1.015 1.010 - 1.025   Blood, UA 10    pH, UA 6.5 5.0 - 8.0   Protein, UA Negative    Urobilinogen, UA 0.2 0.2 or 1.0 E.U./dL   Nitrite, UA Negative    Leukocytes, UA Negative Negative   Assessment and Plan:    Eileen Wilkerson was seen today for follow-up.  Diagnoses and all orders for this visit:  Burning with urination Comments: Negative Udip. Patient will call if symptoms continue. Possible history of IC upon further discussion. Consider [place]. Orders: -     POCT Urinalysis Dipstick  Need for pneumococcal vaccination -     Discontinue: pneumococcal 13-valent conjugate vaccine (PREVNAR 13) injection 0.5 mL; Inject 0.5 mLs into the muscle tomorrow at 10 am. -     Pneumococcal conjugate vaccine 13-valent  Estrogen deficiency Comments: Due for DEXA. Reclast ordered. Orders: -     DG Bone Density; Future  Fibromyalgia Comments: Will continue Savella as this works well for her.  Orders: -     Milnacipran (SAVELLA) 50 MG TABS tablet; Take  1 tablet (50 mg total) by mouth 2 (two) times daily.  Bilateral lower extremity edema Comments:  Controlled with Lasix. Not currently on KCL, so added to regimen today. Hx of hypokalemia. 2017 ECHO: Normal LV size with EF 55-60%. Norma RV size and systolic function. No significant valvular abnormalities. Orders: -     potassium chloride SA (K-DUR,KLOR-CON) 20 MEQ tablet; Take 1 tablet (20 mEq total) by mouth daily. -     furosemide (LASIX) 80 MG tablet; Take 1 tablet (80 mg total) by mouth daily.   . Reviewed expectations re: course of current medical issues. . Discussed self-management of symptoms. . Outlined signs and symptoms indicating need for more acute intervention. . Patient verbalized understanding and all questions were answered. Marland Kitchen Health Maintenance issues including appropriate healthy diet, exercise, and smoking avoidance were discussed with patient. . See orders for this visit as documented in the electronic medical record. . Patient received an After Visit Summary.  CMA served as Education administrator during this visit. History, Physical, and Plan performed by medical provider. The above documentation has been reviewed and is accurate and complete.  Briscoe Deutscher, D.O.  Briscoe Deutscher, DO [place] [place] 06/30/2017  Future Appointments Date Time Provider [place]  07/11/2017 11:30 AM Parrett, Fonnie Mu, NP [facility] None  11/28/2017 11:00 AM Bo Merino, MD [facility] None

## 2017-06-26 NOTE — Progress Notes (Deleted)
Assessment / Plan:     Visit Diagnoses: High risk medications (not anticoagulants) long-term use - 04/19/2017: Otrexup 15 mg every week; folic acid 2 mg daily; PLQ 200 mg QD(adequate response) except left first DIP w/ possible synovitis vs. severe OA. - Plan: CBC with Differential/Platelet, COMPLETE METABOLIC PANEL WITH GFR  Rheumatoid arthritis of multiple sites with negative rheumatoid factor (HCC)  Thumb pain, left - 04/19/2017: Left first DIP with possible synovitis versus severe OA  Fibromyalgia  Other fatigue  Other insomnia   Plan: #1: Rheumatoid arthritis No joint pain, swelling, stiffness except left first DIP with swelling and pain. Differential includes possible synovitis (we will do ultrasound to rule out) versus severe OA. At this time we will continue Otrexup at 15 mg per week; folic acid 2 mg daily; Plaquenil 200 mg daily  Plan: Schedule ultrasound and office visit at the next available to do bilateral ultrasound of hands and wrist; specifically, rule out synovitis of the left first DIP joint. If positive for synovitis, consider increasing Otrexup 20 mg.  ( Note: Patient has a history of UTIs with elevated Otrexup in the past and has done really well with combination of Otrexup and Plaquenil.)  Plaquenil eye exam was normal baseline October 2017 and Will be due October 2018  #2: High risk prescription Otrexup 15 mg every week Folic acid 2 mg daily Plaquenil 200 mg daily Plaquenil eye exam normal October 2017  #3: Left hand pain at the left first DIP.Differential includes possible synovitis (we will do ultrasound to rule out) versus severe OA.  #4: Fibromyalgia. Active disease with generalized pain and positive tender points 18/18  #5 insomnia and fatigue. Ongoing.  #6: Right foot fracture. Saw Dr. Sharol Given for this. Please see Epic for full details. Patient is currently wearing a boot for this.  #7: Return to clinic in mid July for ultrasound of  bilateral hands and specifically look at the left first DIP joint for synovitis and an office visit to adjust medication if needed.  #8: Last labs were March 2018 and were within normal limits except for slight elevation of creatinine and slight decrease in GFR. (Not as bad compared to December 2017 kidney function.)

## 2017-06-27 ENCOUNTER — Inpatient Hospital Stay (INDEPENDENT_AMBULATORY_CARE_PROVIDER_SITE_OTHER): Payer: Medicare Other

## 2017-06-27 ENCOUNTER — Encounter: Payer: Self-pay | Admitting: Pharmacist

## 2017-06-27 ENCOUNTER — Encounter: Payer: Self-pay | Admitting: Rheumatology

## 2017-06-27 ENCOUNTER — Ambulatory Visit (INDEPENDENT_AMBULATORY_CARE_PROVIDER_SITE_OTHER): Payer: Medicare Other | Admitting: Rheumatology

## 2017-06-27 VITALS — BP 139/75 | HR 85 | Resp 18 | Ht 64.5 in | Wt 174.0 lb

## 2017-06-27 DIAGNOSIS — M810 Age-related osteoporosis without current pathological fracture: Secondary | ICD-10-CM

## 2017-06-27 DIAGNOSIS — M19042 Primary osteoarthritis, left hand: Secondary | ICD-10-CM

## 2017-06-27 DIAGNOSIS — M0579 Rheumatoid arthritis with rheumatoid factor of multiple sites without organ or systems involvement: Secondary | ICD-10-CM | POA: Diagnosis not present

## 2017-06-27 DIAGNOSIS — M79642 Pain in left hand: Secondary | ICD-10-CM

## 2017-06-27 DIAGNOSIS — M79641 Pain in right hand: Secondary | ICD-10-CM

## 2017-06-27 DIAGNOSIS — M19071 Primary osteoarthritis, right ankle and foot: Secondary | ICD-10-CM

## 2017-06-27 DIAGNOSIS — M797 Fibromyalgia: Secondary | ICD-10-CM

## 2017-06-27 DIAGNOSIS — M19041 Primary osteoarthritis, right hand: Secondary | ICD-10-CM | POA: Diagnosis not present

## 2017-06-27 DIAGNOSIS — M170 Bilateral primary osteoarthritis of knee: Secondary | ICD-10-CM

## 2017-06-27 DIAGNOSIS — R5383 Other fatigue: Secondary | ICD-10-CM

## 2017-06-27 DIAGNOSIS — D72820 Lymphocytosis (symptomatic): Secondary | ICD-10-CM | POA: Diagnosis not present

## 2017-06-27 DIAGNOSIS — Z7952 Long term (current) use of systemic steroids: Secondary | ICD-10-CM

## 2017-06-27 DIAGNOSIS — Z8619 Personal history of other infectious and parasitic diseases: Secondary | ICD-10-CM

## 2017-06-27 DIAGNOSIS — Z79899 Other long term (current) drug therapy: Secondary | ICD-10-CM | POA: Diagnosis not present

## 2017-06-27 DIAGNOSIS — M19072 Primary osteoarthritis, left ankle and foot: Secondary | ICD-10-CM

## 2017-06-27 NOTE — Patient Instructions (Signed)
     [facility] [address], [place], [place], [place] [address] Telephone: [phone]  Fax: [phone]  Our mutual patient _____________________ (DOB: _____ / _____ / _____) is in need of a BASELINE hydroxychloroquine (Plaquenil) toxicity eye exam or FOLLOW UP Plaquenil eye exam.   Please fill out the form below and fax to our office when the Plaquenil eye exam is completed.   Thank you for your assistance in this matter.   Sincerely,   Bo Merino, MD         Date of Plaquenil Toxicity Eye Exam: _____ / _____ / _____  Plaquenil Toxicity Eye Exam Was:   Normal   ABNORMAL  Plaquenil Should Be:     Continued  DISCONTINUED  Date of Follow-up Plaquenil Eye Exam:  6 mo.          12 mo.     Other: ___ / ___ / ___    Practice Name:       Name of Eye Doctor:      ___________________________   ___________________________     Practice Phone Number:     Signature of Eye Doctor:      ___________________________              ___________________________

## 2017-06-27 NOTE — Progress Notes (Signed)
Office Visit Note  Patient: Eileen Wilkerson             Date of Birth: [birth year]           MRN: [id number]             PCP: Briscoe Deutscher, DO Referring: Thressa Sheller, MD Visit Date: 06/27/2017 Occupation: @GUAROCC @    Subjective:  Medication Management   History of Present Illness: Eileen Wilkerson is a 83 y.o. female with history of rheumatoid arthritis. She states that she was hospitalized in May 2018 for community-acquired pneumonia. At the time she was on methotrexate and Plaquenil combination. She was taken off the methotrexate. She went for follow-up visit with Dr. Chase Caller who after evaluation felt that she still had elevation of her WBC count and physical deconditioning. He wanted to keep her off methotrexate. She's been off methotrexate since her hospitalization. She's been on Plaquenil 200 mg by mouth daily only. She felt that her right wrist was somewhat puffy. None of the other joints have been painful.   Activities of Daily Liiving:  Patient reports morning stiffness for 10 minutes.   Patient Denies nocturnal pain.  Difficulty dressing/grooming: Denies Difficulty climbing stairs: Reports Difficulty getting out of chair: Reports Difficulty using hands for taps, buttons, cutlery, and/or writing: Denies   Review of Systems  Constitutional: Positive for fatigue. Negative for night sweats, weight gain, weight loss and weakness.  HENT: Negative for mouth sores, trouble swallowing, trouble swallowing, mouth dryness and nose dryness.   Eyes: Negative for pain, redness, visual disturbance and dryness.  Respiratory: Positive for shortness of breath. Negative for cough and difficulty breathing.   Cardiovascular: Negative for chest pain, palpitations, hypertension, irregular heartbeat and swelling in legs/feet.  Gastrointestinal: Negative for blood in stool, constipation and diarrhea.  Endocrine: Negative for increased urination.  Genitourinary: Negative for vaginal  dryness.  Musculoskeletal: Positive for arthralgias and joint pain. Negative for joint swelling, myalgias, muscle weakness, morning stiffness, muscle tenderness and myalgias.  Skin: Negative for color change, rash, hair loss, skin tightness, ulcers and sensitivity to sunlight.  Allergic/Immunologic: Negative for susceptible to infections.  Neurological: Negative for dizziness, memory loss and night sweats.  Hematological: Negative for swollen glands.  Psychiatric/Behavioral: Negative for depressed mood and sleep disturbance. The patient is not nervous/anxious.     PMFS History:  Patient Active Problem List   Diagnosis Date Noted  . Parainfluenza virus infection 05/30/2017  . History of bacterial pneumonia 05/30/2017  . Physical deconditioning 05/30/2017  . Immunosuppressed status ([place]) 05/30/2017  . Abnormal CXR 05/17/2017  . Sepsis ([place]) 04/25/2017  . PNA (pneumonia) 04/25/2017  . Hypokalemia 04/25/2017  . Nondisplaced fracture of fifth right metatarsal bone with routine healing 04/12/2017  . Fibromyalgia 12/18/2016  . DJD (degenerative joint disease), cervical 12/18/2016  . Spondylosis of lumbar region without myelopathy or radiculopathy 12/18/2016  . Other fatigue 12/18/2016  . Trigger finger, right middle finger 12/18/2016  . Primary osteoarthritis of both hands 12/18/2016  . Primary osteoarthritis of both feet 12/18/2016  . Primary osteoarthritis of both knees 12/18/2016  . History of gastroesophageal reflux (GERD) 12/18/2016  . Age-related osteoporosis without current pathological fracture 12/18/2016  . High risk medication use 12/18/2016  . Rheumatoid arthritis ([place]) 12/28/2015  . Long term current use of systemic steroids 12/28/2015  . Lymphocytosis 11/13/2012    Past Medical History:  Diagnosis Date  . Adrenal failure ([place])   . Arthritis   . Cataract   . Fibromyalgia 2008  .  Osteoporosis   . RA (rheumatoid arthritis) ([facility])     Family History  Problem Relation  Age of Onset  . Breast cancer Mother   . Diabetes Father    Past Surgical History:  Procedure Laterality Date  . BACK SURGERY    . BILATERAL CARPAL TUNNEL RELEASE    . CATARACT EXTRACTION, BILATERAL  2004  . CERVICAL FUSION  2011,2010,2008  . HEEL SPUR SURGERY  2004  . ROTATOR CUFF REPAIR    . TONSILLECTOMY AND ADENOIDECTOMY  1947  . TOTAL SHOULDER ARTHROPLASTY     Social History   Social History Narrative  . No narrative on file     Objective: Vital Signs: BP 139/75 (BP Location: Left Arm, Patient Position: Sitting, Cuff Size: Normal)   Pulse 85   Resp 18   Ht 5' 4.5" (1.638 m)   Wt 174 lb (78.9 kg)   BMI 29.41 kg/m    Physical Exam  Constitutional: She is oriented to person, place, and time. She appears well-developed and well-nourished.  HENT:  Head: Normocephalic and atraumatic.  Eyes: Conjunctivae and EOM are normal.  Neck: Normal range of motion.  Cardiovascular: Normal rate, regular rhythm, normal heart sounds and intact distal pulses.   Pulmonary/Chest: Effort normal and breath sounds normal.  Abdominal: Soft. Bowel sounds are normal.  Lymphadenopathy:    She has no cervical adenopathy.  Neurological: She is alert and oriented to person, place, and time.  Skin: Skin is warm and dry. Capillary refill takes less than 2 seconds.  Psychiatric: She has a normal mood and affect. Her behavior is normal.  Nursing note and vitals reviewed.    Musculoskeletal Exam: C-spine and thoracic lumbar spine good range of motion. She does have thoracic kyphosis. Shoulder joints although joints wrist joint MCPs PIPs DIPs with good range of motion with no synovitis. Hip joints knee joints ankles MTPs PIPs DIPs with good range of motion with no synovitis. She does have DIP PIP thickening in her hands and feet consistent with osteoarthritis.  CDAI Exam: CDAI Homunculus Exam:   Joint Counts:  CDAI Tender Joint count: 0 CDAI Swollen Joint count: 0  Global Assessments:  Patient  Global Assessment: 6 Provider Global Assessment: 2  CDAI Calculated Score: 8    Investigation: Findings:  October 2017 normal Plaquenil eye exam   UDS 02/22/2017 Narcotic agreement   05/17/2017 CMP potassium 3.3 glucose 129 calcium 10.8 GFR 53.8, CBC W BC 13.5  Imaging: [place] Extrem Up Bilat Comp  Result Date: 06/27/2017 Ultrasound examination of bilateral hands was performed per EULAR recommendations. Using 12 MHz transducer, grayscale and power Doppler bilateral second, third, and fifth MCP joints and bilateral wrist joints both dorsal and volar aspects were evaluated to look for synovitis or tenosynovitis. The findings were there was no synovitis or tenosynovitis on ultrasound examination. Right median nerve was 0.9 cm squares which was within normal limits and left median nerve was 0.9 cm squares and bifid which was within normal limits. Impression: Ultrasound examination was negative for synovitis.  Xr Ankle Complete Right  Result Date: 06/20/2017 Right ankle 3 views: Talus located within the ankle mortise no diastases. Well healed fifth metatarsal fracture. No other bony abnormalities acute fractures noted.  Xr Ankle Complete Right  Result Date: 06/05/2017 No acute findings   Speciality Comments: No specialty comments available.    Procedures:  No procedures performed Allergies: Adhesive [tape]; Celebrex [celecoxib]; Ciprofibrate; Cymbalta [duloxetine hcl]; Gabitril [tiagabine]; Keflex [cephalexin]; Lyrica [pregabalin]; Neurontin [gabapentin]; Nexium [esomeprazole]; Nsaids;  Penicillins; Shrimp [shellfish allergy]; Sulfa antibiotics; and Claritin-d 12 hour [loratadine-pseudoephedrine er]   Assessment / Plan:     Visit Diagnoses: Rheumatoid arthritis involving multiple sites with positive rheumatoid factor ([place]): She has no synovitis on examination today. Ultrasound examination today in the office today did not show any synovitis as well. She's been off methotrexate since May  after she developed pneumonia.  High risk medication use - Plaquenil 200 mg po qd. We discussed that the Plaquenil dose can be increased to twice a day in case she has a flare she is doing really good right now and had no synovitis. I've advised her to have labs in a month.  Pain in both hands - Plan: [place] Extrem Up Bilat Comp  Primary osteoarthritis of both hands: She is chronic pain in her hands due to osteoarthritis.  Primary osteoarthritis of both knees: Chronic pain and knee joints due to underlying osteoarthritis.  Primary osteoarthritis of both feet: Proper fitting shoes were discussed.  Age-related osteoporosis without current pathological fracture: She is on Reclast infusions.  Long term current use of systemic steroids - Due to adrenal insufficiency. She has appointment coming up with endocrinologist in September.  Fibromyalgia: She continues to have some generalized pain.  Fatigue due to deconditioning.    Orders: Orders Placed This Encounter  Procedures  . [place] Extrem Up Bilat Comp   No orders of the defined types were placed in this encounter.   Face-to-face time spent with patient was 30 minutes. 50% of time was spent in counseling and coordination of care.  Follow-Up Instructions: Return in about 5 months (around 11/27/2017) for Rheumatoid arthritis.   Bo Merino, MD  Note - This record has been created using Editor, commissioning.  Chart creation errors have been sought, but may not always  have been located. Such creation errors do not reflect on  the standard of medical care.

## 2017-06-27 NOTE — Progress Notes (Signed)
Rheumatology Medication Review by a Pharmacist Does the patient feel that his/her medications are working for him/her?  Yes Has the patient been experiencing any side effects to the medications prescribed?  No Does the patient have any problems obtaining medications?  No  Issues to address at subsequent visits: None   Pharmacist comments:  Eileen Wilkerson is a pleasant 83 yo F who presents for follow up of rheumatoid arthritis.  She is currently taking hydroxychloroquine 200 mg daily.  She was previously on Otrexup 15 mg weekly, but patient reports this medication was discontinued in May 2018 after she was hospitalized for pneumonia.  Patient had recent standing labs in June 2018.  Wilkerson recent hydroxychloroquine eye exam was on 09/18/16 which was normal.  Provided patient with eye exam form.  Patient denies any questions or concerns regarding her medications at this time.   Eileen Wilkerson, Pharm.D., BCPS, CPP Clinical Pharmacist Pager: [phone] Phone: [phone] 06/27/2017 3:36 PM

## 2017-06-29 ENCOUNTER — Telehealth: Payer: Self-pay | Admitting: Family Medicine

## 2017-06-29 ENCOUNTER — Institutional Professional Consult (permissible substitution): Payer: Medicare Other | Admitting: Internal Medicine

## 2017-06-29 ENCOUNTER — Other Ambulatory Visit: Payer: Self-pay

## 2017-06-29 MED ORDER — ATORVASTATIN CALCIUM 20 MG PO TABS: 20.0000 mg | ORAL_TABLET | Freq: Every evening | ORAL | 3 refills | Status: DC | PRN

## 2017-06-29 NOTE — Telephone Encounter (Signed)
Still pending gathering information on IV infusion of medication.

## 2017-06-29 NOTE — Telephone Encounter (Signed)
Refill sent.

## 2017-06-29 NOTE — Telephone Encounter (Signed)
Please advise on refill.

## 2017-06-29 NOTE — Telephone Encounter (Signed)
**  Remind patient they can make refill requests via MyChart**  Medication refill request (Name & Dosage):  atorvastatin (LIPITOR) 20 MG tablet  Preferred pharmacy (Name & Address):  [facility], [place] [phone] (Phone) [phone] (Fax)   Other comments (if applicable):

## 2017-06-30 DIAGNOSIS — R600 Localized edema: Secondary | ICD-10-CM | POA: Insufficient documentation

## 2017-07-02 NOTE — Telephone Encounter (Signed)
Was made aware rheumatology may be able to administer reclast for patient. Reached out to [facility] around potential referral to them for medication management. Waiting for return call from them.

## 2017-07-02 NOTE — Addendum Note (Signed)
Addended by: Precious Gilding on: 07/02/2017 02:48 PM   Modules accepted: Orders

## 2017-07-03 ENCOUNTER — Telehealth: Payer: Self-pay | Admitting: Family Medicine

## 2017-07-03 NOTE — Telephone Encounter (Signed)
Left verbal order on confidential voicemail.

## 2017-07-03 NOTE — Telephone Encounter (Signed)
Calling in reference to getting verbal orders to continue PT 1x a week for 1 week, 2x week for 3 weeks and 1x a week for 2 weeks.  Please call and advise Betsy from [facility]

## 2017-07-04 ENCOUNTER — Telehealth (INDEPENDENT_AMBULATORY_CARE_PROVIDER_SITE_OTHER): Payer: Self-pay | Admitting: Rheumatology

## 2017-07-04 NOTE — Telephone Encounter (Signed)
Patient would like to know since doctor took her off the MTX does she need to dc the folic acid. Please call patient to advise.

## 2017-07-04 NOTE — Telephone Encounter (Signed)
Patient advised she may discontinue Folic Acid since she is no longer on MTX.

## 2017-07-06 NOTE — Telephone Encounter (Signed)
Awaiting response back from Rheumatology to determine if patient can have reclast transfusion at their office.

## 2017-07-11 ENCOUNTER — Ambulatory Visit (INDEPENDENT_AMBULATORY_CARE_PROVIDER_SITE_OTHER): Payer: Medicare Other | Admitting: Adult Health

## 2017-07-11 ENCOUNTER — Telehealth: Payer: Self-pay

## 2017-07-11 ENCOUNTER — Other Ambulatory Visit: Payer: Self-pay

## 2017-07-11 ENCOUNTER — Encounter: Payer: Self-pay | Admitting: Adult Health

## 2017-07-11 ENCOUNTER — Ambulatory Visit (INDEPENDENT_AMBULATORY_CARE_PROVIDER_SITE_OTHER)
Admission: RE | Admit: 2017-07-11 | Discharge: 2017-07-11 | Disposition: A | Discharge status: Home / Self Care | Payer: Medicare Other | Source: Ambulatory Visit | Attending: Adult Health | Admitting: Adult Health

## 2017-07-11 VITALS — BP 126/74 | HR 80 | Ht 64.5 in | Wt 173.4 lb

## 2017-07-11 DIAGNOSIS — B338 Other specified viral diseases: Secondary | ICD-10-CM

## 2017-07-11 DIAGNOSIS — J189 Pneumonia, unspecified organism: Secondary | ICD-10-CM | POA: Diagnosis not present

## 2017-07-11 DIAGNOSIS — R938 Abnormal findings on diagnostic imaging of other specified body structures: Secondary | ICD-10-CM | POA: Diagnosis not present

## 2017-07-11 DIAGNOSIS — R9389 Abnormal findings on diagnostic imaging of other specified body structures: Secondary | ICD-10-CM

## 2017-07-11 DIAGNOSIS — B348 Other viral infections of unspecified site: Secondary | ICD-10-CM

## 2017-07-11 DIAGNOSIS — E2839 Other primary ovarian failure: Secondary | ICD-10-CM

## 2017-07-11 DIAGNOSIS — M810 Age-related osteoporosis without current pathological fracture: Secondary | ICD-10-CM

## 2017-07-11 MED ORDER — ATORVASTATIN CALCIUM 20 MG PO TABS: 20.0000 mg | ORAL_TABLET | Freq: Every evening | ORAL | 1 refills | Status: DC | PRN

## 2017-07-11 NOTE — Telephone Encounter (Signed)
I received notification that Eileen Wilkerson did not read the My Chart message sent on 06/27/17.  I called patient to inform her she is due for labs this month.  Patient voiced understanding.   [name], Pharm.D., BCPS, CPP Clinical Pharmacist Pager: [phone] Phone: [phone] 07/11/2017 9:08 AM

## 2017-07-11 NOTE — Assessment & Plan Note (Signed)
clniically improved with resolution on cXR  No further abx indicated.

## 2017-07-11 NOTE — Telephone Encounter (Signed)
Patient would like to know update regarding her Reclast infusion.  Advised that Dr. Paulla Fore has reached out to Dr. Arlean Hopping office regarding Reclast infusion and we are awaiting a response.  Patient verbalized understanding and I told her she would receive a phone call when we have more information.  Patient also states a bone density test was ordered for her but no one has contacted her to schedule her for this.  Advised I will look into this and will personally call with an appointment or have the imaging center contact her to schedule.  Patient calling asking that her atorvastatin be re-sent to her pharmacy as a 90-day supply.  Re-sent Rx to [facility] on [address].

## 2017-07-11 NOTE — Patient Instructions (Signed)
Continue on current regimen  Follow up Dr. Chase Caller As needed

## 2017-07-11 NOTE — Progress Notes (Signed)
@Patient  ID: Eileen Wilkerson, female    DOB: November 15, 1932, 83 y.o.   MRN: [id number]  Chief Complaint  Patient presents with  . Acute Visit    cough     Referring provider: Briscoe Deutscher, DO  HPI: 83 year old female never smoker with known rheumatoid arthritis on methotrexate and chronic steroids since 2009 seen for pulmonary consult 05/30/2017 for chronic cough and abnormal chest x-ray. Admitted May 2018 with acute respiratory illness. Positive for parainfluenza virus. 3/   TEST  04/2017 RVP >+ parainfluenza virus 3  Chest x-ray May 2018 showed bibasilar infiltrates  07/11/2017 Follow up : Cough, pneumonia, abnormal chest x-ray. Patient returns for a 6 week follow-up. Patient was seen for reconsult 05/30/2017 for cough and abnormal chest x-ray. Patient was admitted with an acute respiratory illness in May. She was positive for parainfluenza virus. 3. She subsequent developed an abnormal chest x-ray in June that showed bilateral infiltrates. She was treated for a pneumonia with antibiotics. Patient was recommended to stop her methotrexate. Patient returns today improved. Cough has resolved. Chest x-ray today shows clearance of bilateral infiltrates.. Patient says her breathing is doing okay. She denies any chest pain, orthopnea, PND, or increased leg swelling.   Allergies  Allergen Reactions  . Adhesive [Tape] Rash  . Celebrex [Celecoxib] Hives  . Ciprofibrate Nausea Only  . Cymbalta [Duloxetine Hcl] Swelling  . Gabitril [Tiagabine] Swelling  . Keflex [Cephalexin] Nausea And Vomiting  . Lyrica [Pregabalin] Swelling  . Neurontin [Gabapentin] Swelling  . Nexium [Esomeprazole] Rash  . Nsaids Rash  . Penicillins Rash    Injection site reaction Has patient had a PCN reaction causing immediate rash, facial/tongue/throat swelling, SOB or lightheadedness with hypotension: No Has patient had a PCN reaction causing severe rash involving mucus membranes or skin necrosis: No Has patient  had a PCN reaction that required hospitalization No Has patient had a PCN reaction occurring within the last 10 years: No If all of the above answers are "NO", then may proceed with Cephalosporin use.   . Shrimp [Shellfish Allergy] Anaphylaxis    Per patient "shrimp only"  . Sulfa Antibiotics Nausea And Vomiting  . Claritin-D 12 Hour [Loratadine-Pseudoephedrine Er] Anxiety    Immunization History  Administered Date(s) Administered  . Influenza,inj,Quad PF,36+ Mos 08/11/2016  . Pneumococcal Conjugate-13 06/26/2017    Past Medical History:  Diagnosis Date  . Adrenal failure ([place])   . Arthritis   . Cataract   . Fibromyalgia 2008  . Osteoporosis   . RA (rheumatoid arthritis) ([facility])     Tobacco History: History  Smoking Status  . Former Smoker  . Packs/day: 0.75  . Years: 10.00  . Types: Cigarettes  . Quit date: 1968  Smokeless Tobacco  . Never Used   Counseling given: Not Answered   Outpatient Encounter Prescriptions as of 07/11/2017  Medication Sig  . acetaminophen (TYLENOL) 650 MG CR tablet Take 1,300 mg by mouth 2 (two) times daily.  Marland Kitchen atorvastatin (LIPITOR) 20 MG tablet Take 1 tablet (20 mg total) by mouth at bedtime as needed.  . Biotin (BIOTIN MAXIMUM STRENGTH) 10 MG TABS Take 10 mg by mouth daily.  . Cholecalciferol (VITAMIN D3) 5000 units CAPS Take 5,000 Units by mouth daily.  . fluticasone (FLONASE) 50 MCG/ACT nasal spray   . folic acid (FOLVITE) 1 MG tablet TAKE TWO TABLETS BY MOUTH ONCE DAILY  . furosemide (LASIX) 80 MG tablet Take 1 tablet (80 mg total) by mouth daily.  . hydroxychloroquine (PLAQUENIL) 200 MG tablet  TAKE 1 TABLET BY MOUTH ONCE DAILY  . methocarbamol (ROBAXIN) 500 MG tablet TAKE 1 TABLET BY MOUTH EVERY 6 TO 8 HOURS AS NEEDED FOR PAIN  . Milnacipran (SAVELLA) 50 MG TABS tablet Take 1 tablet (50 mg total) by mouth 2 (two) times daily.  . potassium chloride SA (K-DUR,KLOR-CON) 20 MEQ tablet Take 1 tablet (20 mEq total) by mouth daily.  .  predniSONE (DELTASONE) 5 MG tablet Take 5 mg by mouth daily with breakfast.  . PREVACID 24HR 15 MG capsule Take 15 mg by mouth Daily.  . traMADol (ULTRAM) 50 MG tablet Take 1 tablet (50 mg total) by mouth 2 (two) times daily. prn  . Zoledronic Acid (RECLAST IV) Inject into the vein.   No facility-administered encounter medications on file as of 07/11/2017.      Review of Systems  Constitutional:   No  weight loss, night sweats,  Fevers, chills, + fatigue, or  lassitude.  HEENT:   No headaches,  Difficulty swallowing,  Tooth/dental problems, or  Sore throat,                No sneezing, itching, ear ache, nasal congestion, post nasal drip,   CV:  No chest pain,  Orthopnea, PND, swelling in lower extremities, anasarca, dizziness, palpitations, syncope.   GI  No heartburn, indigestion, abdominal pain, nausea, vomiting, diarrhea, change in bowel habits, loss of appetite, bloody stools.   Resp: No shortness of breath with exertion or at rest.  No excess mucus, no productive cough,  No non-productive cough,  No coughing up of blood.  No change in color of mucus.  No wheezing.  No chest wall deformity  Skin: no rash or lesions.  GU: no dysuria, change in color of urine, no urgency or frequency.  No flank pain, no hematuria   MS:  Chronic joint pain     Physical Exam  BP 126/74 (BP Location: Left Arm, Cuff Size: Normal)   Pulse 80   Ht 5' 4.5" (1.638 m)   Wt 173 lb 6.4 oz (78.7 kg)   SpO2 98%   BMI 29.30 kg/m   GEN: A/Ox3; pleasant , NAD, elderly    HEENT:  [place]/AT,  EACs-clear, TMs-wnl, NOSE-clear, THROAT-clear, no lesions, no postnasal drip or exudate noted.   NECK:  Supple w/ fair ROM; no JVD; normal carotid impulses w/o bruits; no thyromegaly or nodules palpated; no lymphadenopathy.    RESP  Clear  P & A; w/o, wheezes/ rales/ or rhonchi. no accessory muscle use, no dullness to percussion  CARD:  RRR, no m/r/g, no peripheral edema, pulses intact, no cyanosis or clubbing.  GI:    Soft & nt; nml bowel sounds; no organomegaly or masses detected.   Musco: Warm bil, arthritic changes in hands   Neuro: alert, no focal deficits noted.    Skin: Warm, no lesions or rashes    Lab Results:  CBC  ProBNP No results found for: PROBNP  Imaging: Dg Chest 2 View  Result Date: 07/11/2017 CLINICAL DATA:  Pneumonia. EXAM: CHEST  2 VIEW COMPARISON:  05/17/2017 and 01/11/2016 and 12/29/2015 and 01/23/2017 FINDINGS: There is been clearing of the patchy bibasilar pulmonary infiltrates. The lungs are now clear except for chronic changes in the lingula of the left lung. Heart size and vascularity are normal. No effusions. No acute bone abnormality. IMPRESSION: Clearing of the patchy infiltrates at the lung bases. Residual scarring in the lingula. Electronically Signed   By: Lorriane Shire M.D.   On:  07/11/2017 12:00   [place] Extrem Up Bilat Comp  Result Date: 06/27/2017 Ultrasound examination of bilateral hands was performed per EULAR recommendations. Using 12 MHz transducer, grayscale and power Doppler bilateral second, third, and fifth MCP joints and bilateral wrist joints both dorsal and volar aspects were evaluated to look for synovitis or tenosynovitis. The findings were there was no synovitis or tenosynovitis on ultrasound examination. Right median nerve was 0.9 cm squares which was within normal limits and left median nerve was 0.9 cm squares and bifid which was within normal limits. Impression: Ultrasound examination was negative for synovitis.  Xr Ankle Complete Right  Result Date: 06/20/2017 Right ankle 3 views: Talus located within the ankle mortise no diastases. Well healed fifth metatarsal fracture. No other bony abnormalities acute fractures noted.    Assessment & Plan:   No problem-specific Assessment & Plan notes found for this encounter.     Rexene Edison, NP 07/11/2017

## 2017-07-11 NOTE — Assessment & Plan Note (Signed)
Bilateral infiltrates in setting of Viral illness along PNA and MTX . She is much improved and resolution on cxr.  follow up .As needed

## 2017-07-16 ENCOUNTER — Other Ambulatory Visit: Payer: Self-pay

## 2017-07-16 DIAGNOSIS — Z79899 Other long term (current) drug therapy: Secondary | ICD-10-CM

## 2017-07-16 LAB — CBC WITH DIFFERENTIAL/PLATELET
Basophils Absolute: 87 cells/uL (ref 0–200)
Basophils Relative: 1 %
Eosinophils Absolute: 87 cells/uL (ref 15–500)
Eosinophils Relative: 1 %
HCT: 39.6 % (ref 35.0–45.0)
Hemoglobin: 12.9 g/dL (ref 11.7–15.5)
Lymphocytes Relative: 26 %
Lymphs Abs: 2262 cells/uL (ref 850–3900)
MCH: 31.8 pg (ref 27.0–33.0)
MCHC: 32.6 g/dL (ref 32.0–36.0)
MCV: 97.5 fL (ref 80.0–100.0)
MPV: 9.8 fL (ref 7.5–12.5)
Monocytes Absolute: 696 cells/uL (ref 200–950)
Monocytes Relative: 8 %
Neutro Abs: 5568 cells/uL (ref 1500–7800)
Neutrophils Relative %: 64 %
Platelets: 309 10*3/uL (ref 140–400)
RBC: 4.06 MIL/uL (ref 3.80–5.10)
RDW: 14.1 % (ref 11.0–15.0)
WBC: 8.7 10*3/uL (ref 3.8–10.8)

## 2017-07-17 LAB — COMPLETE METABOLIC PANEL WITH GFR
ALT: 14 U/L (ref 6–29)
AST: 21 U/L (ref 10–35)
Albumin: 3.8 g/dL (ref 3.6–5.1)
Alkaline Phosphatase: 65 U/L (ref 33–130)
BUN: 17 mg/dL (ref 7–25)
CO2: 29 mmol/L (ref 20–32)
Calcium: 9.5 mg/dL (ref 8.6–10.4)
Chloride: 100 mmol/L (ref 98–110)
Creat: 0.93 mg/dL — ABNORMAL HIGH (ref 0.60–0.88)
GFR, Est African American: 66 mL/min (ref >=60)
GFR, Est Non African American: 57 mL/min — ABNORMAL LOW (ref >=60)
Glucose, Bld: 102 mg/dL — ABNORMAL HIGH (ref 65–99)
Potassium: 4.2 mmol/L (ref 3.5–5.3)
Sodium: 140 mmol/L (ref 135–146)
Total Bilirubin: 0.4 mg/dL (ref 0.2–1.2)
Total Protein: 7.3 g/dL (ref 6.1–8.1)

## 2017-07-17 NOTE — Progress Notes (Signed)
stable °

## 2017-07-17 NOTE — Addendum Note (Signed)
Addended by: Precious Gilding on: 07/17/2017 12:58 PM   Modules accepted: Orders

## 2017-07-17 NOTE — Telephone Encounter (Signed)
Spoke with Dr. Paulla Fore. Advised to place referral to [facility] to see Dr. Estanislado Pandy for reclast infusion. Referral placed.

## 2017-07-18 NOTE — Telephone Encounter (Signed)
Referral made on 07/17/17.  Patient should hear from rheumatology for scheduling

## 2017-07-19 ENCOUNTER — Other Ambulatory Visit: Payer: Self-pay | Admitting: Rheumatology

## 2017-07-19 NOTE — Telephone Encounter (Signed)
Last Visit: 06/27/17 Next Visit: 11/28/17  Okay to refill per Dr. Deveshwar 

## 2017-07-20 ENCOUNTER — Other Ambulatory Visit: Payer: Self-pay | Admitting: Rheumatology

## 2017-07-20 ENCOUNTER — Telehealth: Payer: Self-pay | Admitting: *Deleted

## 2017-07-20 NOTE — Telephone Encounter (Signed)
Called pt to reschedule AWV for 01/2018 due to [facility] reporting AWV on 01/11/2017. Pt would like to schedule a physical exam with Dr Juleen China and have her lipids checked. Pt had to get off the phone to help her husband but she will call back and schedule a physical when she time.

## 2017-07-20 NOTE — Telephone Encounter (Signed)
Last Visit: 06/27/17 Next Visit: 11/28/17 UDS:02/22/17 Narc Agreement: 02/22/17  Okay to refill tramadol? 

## 2017-07-20 NOTE — Telephone Encounter (Signed)
ok 

## 2017-07-20 NOTE — Progress Notes (Deleted)
Pre visit review using our clinic review tool, if applicable. No additional management support is needed unless otherwise documented below in the visit note. 

## 2017-07-20 NOTE — Telephone Encounter (Signed)
Noted  

## 2017-07-23 ENCOUNTER — Ambulatory Visit (INDEPENDENT_AMBULATORY_CARE_PROVIDER_SITE_OTHER): Payer: Medicare Other | Admitting: Family

## 2017-07-23 ENCOUNTER — Ambulatory Visit (INDEPENDENT_AMBULATORY_CARE_PROVIDER_SITE_OTHER): Payer: Medicare Other

## 2017-07-23 ENCOUNTER — Encounter (INDEPENDENT_AMBULATORY_CARE_PROVIDER_SITE_OTHER): Payer: Self-pay | Admitting: Family

## 2017-07-23 VITALS — Ht 64.5 in | Wt 173.0 lb

## 2017-07-23 DIAGNOSIS — M79671 Pain in right foot: Secondary | ICD-10-CM

## 2017-07-23 DIAGNOSIS — S92354A Nondisplaced fracture of fifth metatarsal bone, right foot, initial encounter for closed fracture: Secondary | ICD-10-CM

## 2017-07-23 NOTE — Progress Notes (Signed)
Office Visit Note   Patient: Eileen Wilkerson           Date of Birth: 03/14/1932           MRN: [id number] Visit Date: 07/23/2017              Requested by: Briscoe Deutscher, [address] [place], [place] [address] PCP: Briscoe Deutscher, DO  Chief Complaint  Patient presents with  . Right Foot - Fracture      HPI: The patient is an 83 year old woman who states that she was home with her husband and he began to fall she tried to break his fall and catch him and twisted her foot and ankle. Felt immediate onset of pain. Presents today concerned for fracture of her right foot. Is ambulating with a walker full weightbearing. States this is worsening her pain. A cam boot on that she has from a previous injury.  Assessment & Plan: Visit Diagnoses:  1. Nondisplaced fracture of fifth metatarsal bone, left foot, initial encounter for closed fracture   2. Pain in right foot     Plan: Continue the CAM walker. She does have a postop shoe at home may advance to this as tolerated. Follow-up in office in 2 more weeks for repeat radiographs.  Follow-Up Instructions: Return in about 2 weeks (around 08/06/2017).   Right Ankle Exam  Swelling: mild  Tenderness  The patient is experiencing no tenderness.  Comments:  Mild swelling to the lateral aspect of the foot.     tender to palpation to the fifth metatarsal base.  Patient is alert, oriented, no adenopathy, well-dressed, normal affect, normal respiratory effort.   Imaging: Xr Foot Complete Right  Result Date: 07/23/2017 Radiographs of the right foot show a metaphyseal 5th metatarsal fracture. Is nondisplaced.   No images are attached to the encounter.  Labs: Lab Results  Component Value Date   ESRSEDRATE 74 (H) 05/17/2017   CRP 2.0 05/17/2017   REPTSTATUS 04/30/2017 FINAL 04/25/2017   CULT  04/25/2017    NO GROWTH 5 DAYS Performed at [facility], [place] [address], [place],  [address]     Orders:    Orders Placed This Encounter  Procedures  . XR Foot Complete Right   No orders of the defined types were placed in this encounter.    Procedures: No procedures performed  Clinical Data: No additional findings.  ROS:  All other systems negative, except as noted in the HPI. Review of Systems  Constitutional: Negative for chills and fever.  Musculoskeletal: Positive for arthralgias and joint swelling.    Objective: Vital Signs: Ht 5' 4.5" (1.638 m)   Wt 173 lb (78.5 kg)   BMI 29.24 kg/m   Specialty Comments:  No specialty comments available.  PMFS History: Patient Active Problem List   Diagnosis Date Noted  . Bilateral lower extremity edema 06/30/2017  . Physical deconditioning 05/30/2017  . Immunosuppressed status ([place]) 05/30/2017  . Abnormal chest x-ray 05/17/2017  . PNA (pneumonia) 04/25/2017  . Fibromyalgia 12/18/2016  . DJD (degenerative joint disease), cervical 12/18/2016  . Spondylosis of lumbar region without myelopathy or radiculopathy 12/18/2016  . Trigger finger, right middle finger 12/18/2016  . Primary osteoarthritis of both hands 12/18/2016  . Primary osteoarthritis of both feet 12/18/2016  . Primary osteoarthritis of both knees 12/18/2016  . GERD (gastroesophageal reflux disease) 12/18/2016  . Age-related osteoporosis without current pathological fracture 12/18/2016  . High risk medication use 12/18/2016  . Rheumatoid arthritis (  [place]) 12/28/2015  . Long term current use of systemic steroids 12/28/2015  . Lymphocytosis 11/13/2012   Past Medical History:  Diagnosis Date  . Adrenal failure ([place])   . Arthritis   . Cataract   . Fibromyalgia 2008  . Osteoporosis   . RA (rheumatoid arthritis) (HCC)     Family History  Problem Relation Age of Onset  . Breast cancer Mother   . Diabetes Father     Past Surgical History:  Procedure Laterality Date  . BACK SURGERY    . BILATERAL CARPAL TUNNEL RELEASE    . CATARACT EXTRACTION, BILATERAL  2004  .  CERVICAL FUSION  2011,2010,2008  . HEEL SPUR SURGERY  2004  . ROTATOR CUFF REPAIR    . TONSILLECTOMY AND ADENOIDECTOMY  1947  . TOTAL SHOULDER ARTHROPLASTY     Social History   Occupational History  . Not on file.   Social History Main Topics  . Smoking status: Former Smoker    Packs/day: 0.75    Years: 10.00    Types: Cigarettes    Quit date: 1968  . Smokeless tobacco: Never Used  . Alcohol use No  . Drug use: No  . Sexual activity: Not on file

## 2017-07-24 ENCOUNTER — Ambulatory Visit: Payer: Medicare Other | Admitting: *Deleted

## 2017-07-31 ENCOUNTER — Telehealth: Payer: Self-pay | Admitting: Family Medicine

## 2017-07-31 NOTE — Telephone Encounter (Signed)
Betsy, PT from [facility] needs verbal orders to extend PT services for 2x a week for 3 weeks. Okay to leave a detailed message on number provided.

## 2017-08-01 NOTE — Telephone Encounter (Signed)
Left message on Betsy voicemail giving verbal order to extend PT services for 2 X a week for 3 weeks.

## 2017-08-06 ENCOUNTER — Ambulatory Visit (INDEPENDENT_AMBULATORY_CARE_PROVIDER_SITE_OTHER): Payer: Medicare Other | Admitting: Orthopedic Surgery

## 2017-08-06 ENCOUNTER — Ambulatory Visit (INDEPENDENT_AMBULATORY_CARE_PROVIDER_SITE_OTHER): Payer: Medicare Other

## 2017-08-06 DIAGNOSIS — M25571 Pain in right ankle and joints of right foot: Secondary | ICD-10-CM | POA: Diagnosis not present

## 2017-08-06 DIAGNOSIS — S92354A Nondisplaced fracture of fifth metatarsal bone, right foot, initial encounter for closed fracture: Secondary | ICD-10-CM

## 2017-08-06 NOTE — Progress Notes (Signed)
Office Visit Note   Patient: Eileen Wilkerson           Date of Birth: Apr 11, 1932           MRN: [id number] Visit Date: 08/06/2017              Requested by: Briscoe Deutscher, [address], [place] [address] PCP: Briscoe Deutscher, DO  Chief Complaint  Patient presents with  . Right Foot - Pain      HPI: Patient is an 83 year old woman who presents status post a second fracture through the base of the fifth metatarsal right foot. Patient states that her previous fracture did not hurt she states that this one does.  Assessment & Plan: Visit Diagnoses:  1. Pain in right ankle and joints of right foot   2. Closed nondisplaced fracture of fifth metatarsal bone of right foot, initial encounter     Plan: Continue the postop shoe. Patient states that with a fracture boot she feels like it throws her out of balance and she previously had a bursal injection in her hip from wearing a fracture boot before.  Plan to follow-up in 3 weeks with repeat radiographs of the right foot.  Follow-Up Instructions: Return in about 3 weeks (around 08/27/2017).   Ortho Exam  Patient is alert, oriented, no adenopathy, well-dressed, normal affect, normal respiratory effort. Examination patient has difficulty getting from a sitting to a standing position she uses a cane in the left hand. Examination the right foot there is no cellulitis no signs of infection. She still has tenderness to palpation over the fracture site.  Imaging: Xr Foot Complete Right  Result Date: 08/06/2017 Three-view radiographs of the right foot shows stable healing metaphyseal diaphyseal fracture base of the fifth metatarsal right foot.  No images are attached to the encounter.  Labs: Lab Results  Component Value Date   ESRSEDRATE 74 (H) 05/17/2017   CRP 2.0 05/17/2017   REPTSTATUS 04/30/2017 FINAL 04/25/2017   CULT  04/25/2017    NO GROWTH 5 DAYS Performed at [facility], [place] [address]  [place], [place] [address]     Orders:  Orders Placed This Encounter  Procedures  . XR Foot Complete Right   No orders of the defined types were placed in this encounter.    Procedures: No procedures performed  Clinical Data: No additional findings.  ROS:  All other systems negative, except as noted in the HPI. Review of Systems  Objective: Vital Signs: There were no vitals taken for this visit.  Specialty Comments:  No specialty comments available.  PMFS History: Patient Active Problem List   Diagnosis Date Noted  . Bilateral lower extremity edema 06/30/2017  . Physical deconditioning 05/30/2017  . Immunosuppressed status ([place]) 05/30/2017  . Abnormal chest x-ray 05/17/2017  . PNA (pneumonia) 04/25/2017  . Fibromyalgia 12/18/2016  . DJD (degenerative joint disease), cervical 12/18/2016  . Spondylosis of lumbar region without myelopathy or radiculopathy 12/18/2016  . Trigger finger, right middle finger 12/18/2016  . Primary osteoarthritis of both hands 12/18/2016  . Primary osteoarthritis of both feet 12/18/2016  . Primary osteoarthritis of both knees 12/18/2016  . GERD (gastroesophageal reflux disease) 12/18/2016  . Age-related osteoporosis without current pathological fracture 12/18/2016  . High risk medication use 12/18/2016  . Rheumatoid arthritis ([place]) 12/28/2015  . Long term current use of systemic steroids 12/28/2015  . Lymphocytosis 11/13/2012   Past Medical History:  Diagnosis Date  . Adrenal failure ([place])   .  Arthritis   . Cataract   . Fibromyalgia 2008  . Osteoporosis   . RA (rheumatoid arthritis) (HCC)     Family History  Problem Relation Age of Onset  . Breast cancer Mother   . Diabetes Father     Past Surgical History:  Procedure Laterality Date  . BACK SURGERY    . BILATERAL CARPAL TUNNEL RELEASE    . CATARACT EXTRACTION, BILATERAL  2004  . CERVICAL FUSION  2011,2010,2008  . HEEL SPUR SURGERY  2004  . ROTATOR CUFF REPAIR    .  TONSILLECTOMY AND ADENOIDECTOMY  1947  . TOTAL SHOULDER ARTHROPLASTY     Social History   Occupational History  . Not on file.   Social History Main Topics  . Smoking status: Former Smoker    Packs/day: 0.75    Years: 10.00    Types: Cigarettes    Quit date: 1968  . Smokeless tobacco: Never Used  . Alcohol use No  . Drug use: No  . Sexual activity: Not on file

## 2017-08-11 ENCOUNTER — Other Ambulatory Visit: Payer: Self-pay | Admitting: Rheumatology

## 2017-08-14 NOTE — Telephone Encounter (Addendum)
Last Visit: 06/27/17 Next Visit: 11/28/17 Labs: 07/16/17 Stable  PLQ Eye Exam: 09/18/16 WNL  Okay to refill per Dr. Estanislado Pandy

## 2017-08-15 ENCOUNTER — Telehealth: Payer: Self-pay | Admitting: *Deleted

## 2017-08-15 NOTE — Telephone Encounter (Signed)
Drug Utilization Review received from [employer]. Patient is on Savella and Tramadol. Patient is at increased risk of Serotonin Syndrome.or seizures. Patient should decrease the Tramadol to 50 mg 1 po BID

## 2017-08-16 NOTE — Telephone Encounter (Signed)
Patient states she currently has a broken foot and is unable to decrease the medication at this time. Patient states she can not use less than 3 Tramadol a day. Patient states she has been on the two medications together for a long time without any issues. Please advise.

## 2017-08-16 NOTE — Telephone Encounter (Signed)
She should try to taper tramadol after her foot heals.

## 2017-08-17 ENCOUNTER — Other Ambulatory Visit: Payer: Self-pay | Admitting: Rheumatology

## 2017-08-17 ENCOUNTER — Telehealth: Payer: Self-pay | Admitting: Radiology

## 2017-08-17 NOTE — Telephone Encounter (Signed)
ok 

## 2017-08-17 NOTE — Telephone Encounter (Signed)
PLQ was refill on 08/14/17  Last Visit: 06/27/17 Next Visit: 11/28/17 UDS:02/22/17 Narc Agreement: 02/22/17  Okay to refill tramadol?

## 2017-08-17 NOTE — Telephone Encounter (Signed)
Patient advised to taper Tramadol after foot heals.

## 2017-08-20 NOTE — Telephone Encounter (Signed)
No phone call I faxed Tramadol, opened this in error

## 2017-08-28 ENCOUNTER — Encounter (INDEPENDENT_AMBULATORY_CARE_PROVIDER_SITE_OTHER): Payer: Self-pay | Admitting: Orthopedic Surgery

## 2017-08-28 ENCOUNTER — Ambulatory Visit (INDEPENDENT_AMBULATORY_CARE_PROVIDER_SITE_OTHER): Payer: Medicare Other | Admitting: Orthopedic Surgery

## 2017-08-28 ENCOUNTER — Ambulatory Visit (INDEPENDENT_AMBULATORY_CARE_PROVIDER_SITE_OTHER): Payer: Medicare Other

## 2017-08-28 DIAGNOSIS — M79671 Pain in right foot: Secondary | ICD-10-CM

## 2017-08-28 DIAGNOSIS — S92354A Nondisplaced fracture of fifth metatarsal bone, right foot, initial encounter for closed fracture: Secondary | ICD-10-CM

## 2017-08-28 NOTE — Progress Notes (Signed)
Office Visit Note   Patient: Eileen Wilkerson           Date of Birth: Nov 23, 1932           MRN: [id number] Visit Date: 08/28/2017              Requested by: Briscoe Deutscher, [address] [place], [place] [address] PCP: Briscoe Deutscher, DO  Chief Complaint  Patient presents with  . Right Foot - Follow-up      HPI: Patient is an 83 year old woman who presents status post fracture base of the fifth metatarsal right foot. Patient states that this still hurts more than it did for her previous fractures.  Assessment & Plan: Visit Diagnoses:  1. Pain in right foot   2. Closed nondisplaced fracture of fifth metatarsal bone of right foot, initial encounter     Plan: Recommend continue with the postoperative shoe continue with a cane to unload pressure follow-up in the office in 3 weeks with repeat 3 view radiographs of the right foot at follow-up.  Follow-Up Instructions: Return in about 3 weeks (around 09/18/2017).   Ortho Exam  Patient is alert, oriented, no adenopathy, well-dressed, normal affect, normal respiratory effort. Examination patient has antalgic gait using a cane postoperative shoe. She has no tenderness to palpation the foot other than over the base of the fifth metatarsal there is some swelling there is no redness no open wounds no signs of infection she has good pulses. She does have varicose veins but no venous ulcers.  Imaging: Xr Foot Complete Right  Result Date: 08/28/2017 Three-view radiographs of the right foot shows interval healing of the metaphyseal fracture base of the fifth metatarsal right foot  No images are attached to the encounter.  Labs: Lab Results  Component Value Date   ESRSEDRATE 74 (H) 05/17/2017   CRP 2.0 05/17/2017   REPTSTATUS 04/30/2017 FINAL 04/25/2017   CULT  04/25/2017    NO GROWTH 5 DAYS Performed at [facility], [place] [address] [place], [place] [address]     Orders:  Orders Placed This Encounter  Procedures   . XR Foot Complete Right   No orders of the defined types were placed in this encounter.    Procedures: No procedures performed  Clinical Data: No additional findings.  ROS:  All other systems negative, except as noted in the HPI. Review of Systems  Objective: Vital Signs: There were no vitals taken for this visit.  Specialty Comments:  No specialty comments available.  PMFS History: Patient Active Problem List   Diagnosis Date Noted  . Bilateral lower extremity edema 06/30/2017  . Physical deconditioning 05/30/2017  . Immunosuppressed status ([place]) 05/30/2017  . Abnormal chest x-ray 05/17/2017  . PNA (pneumonia) 04/25/2017  . Fibromyalgia 12/18/2016  . DJD (degenerative joint disease), cervical 12/18/2016  . Spondylosis of lumbar region without myelopathy or radiculopathy 12/18/2016  . Trigger finger, right middle finger 12/18/2016  . Primary osteoarthritis of both hands 12/18/2016  . Primary osteoarthritis of both feet 12/18/2016  . Primary osteoarthritis of both knees 12/18/2016  . GERD (gastroesophageal reflux disease) 12/18/2016  . Age-related osteoporosis without current pathological fracture 12/18/2016  . High risk medication use 12/18/2016  . Rheumatoid arthritis ([place]) 12/28/2015  . Long term current use of systemic steroids 12/28/2015  . Lymphocytosis 11/13/2012   Past Medical History:  Diagnosis Date  . Adrenal failure ([place])   . Arthritis   . Cataract   . Fibromyalgia 2008  . Osteoporosis   .  RA (rheumatoid arthritis) (H[facility])     Family History  Problem Relation Age of Onset  . Breast cancer Mother   . Diabetes Father     Past Surgical History:  Procedure Laterality Date  . BACK SURGERY    . BILATERAL CARPAL TUNNEL RELEASE    . CATARACT EXTRACTION, BILATERAL  2004  . CERVICAL FUSION  2011,2010,2008  . HEEL SPUR SURGERY  2004  . ROTATOR CUFF REPAIR    . TONSILLECTOMY AND ADENOIDECTOMY  1947  . TOTAL SHOULDER ARTHROPLASTY     Social History     Occupational History  . Not on file.   Social History Main Topics  . Smoking status: Former Smoker    Packs/day: 0.75    Years: 10.00    Types: Cigarettes    Quit date: 1968  . Smokeless tobacco: Never Used  . Alcohol use No  . Drug use: No  . Sexual activity: Not on file

## 2017-09-15 ENCOUNTER — Other Ambulatory Visit: Payer: Self-pay | Admitting: Rheumatology

## 2017-09-17 NOTE — Telephone Encounter (Signed)
Last Visit: 06/27/17 Next Visit: 11/28/17  Okay to refill per Dr. Estanislado Pandy

## 2017-09-18 ENCOUNTER — Encounter (INDEPENDENT_AMBULATORY_CARE_PROVIDER_SITE_OTHER): Payer: Self-pay | Admitting: Orthopedic Surgery

## 2017-09-18 ENCOUNTER — Ambulatory Visit (INDEPENDENT_AMBULATORY_CARE_PROVIDER_SITE_OTHER): Payer: Medicare Other

## 2017-09-18 ENCOUNTER — Ambulatory Visit (INDEPENDENT_AMBULATORY_CARE_PROVIDER_SITE_OTHER): Payer: Medicare Other | Admitting: Orthopedic Surgery

## 2017-09-18 ENCOUNTER — Other Ambulatory Visit: Payer: Self-pay | Admitting: *Deleted

## 2017-09-18 DIAGNOSIS — S92354G Nondisplaced fracture of fifth metatarsal bone, right foot, subsequent encounter for fracture with delayed healing: Secondary | ICD-10-CM

## 2017-09-18 DIAGNOSIS — L97919 Non-pressure chronic ulcer of unspecified part of right lower leg with unspecified severity: Secondary | ICD-10-CM

## 2017-09-18 DIAGNOSIS — L97929 Non-pressure chronic ulcer of unspecified part of left lower leg with unspecified severity: Secondary | ICD-10-CM

## 2017-09-18 DIAGNOSIS — S92354A Nondisplaced fracture of fifth metatarsal bone, right foot, initial encounter for closed fracture: Secondary | ICD-10-CM

## 2017-09-18 DIAGNOSIS — I87333 Chronic venous hypertension (idiopathic) with ulcer and inflammation of bilateral lower extremity: Secondary | ICD-10-CM

## 2017-09-18 DIAGNOSIS — I872 Venous insufficiency (chronic) (peripheral): Secondary | ICD-10-CM | POA: Insufficient documentation

## 2017-09-18 HISTORY — DX: Nondisplaced fracture of fifth metatarsal bone, right foot, initial encounter for closed fracture: S92.354A

## 2017-09-18 MED ORDER — TRAMADOL HCL 50 MG PO TABS: 50.0000 mg | ORAL_TABLET | Freq: Two times a day (BID) | ORAL | 0 refills | Status: DC | PRN

## 2017-09-18 NOTE — Telephone Encounter (Signed)
Last Visit: 06/27/17 Next Visit: 11/28/17 UDS:02/22/17 Narc Agreement: 02/22/17  Okay to refill tramadol?

## 2017-09-18 NOTE — Telephone Encounter (Signed)
ok 

## 2017-09-18 NOTE — Progress Notes (Signed)
Office Visit Note   Patient: Eileen Wilkerson           Date of Birth: 1932-06-11           MRN: [id number] Visit Date: 09/18/2017              Requested by: Briscoe Deutscher, [address] [place] [address] [place], [place] [address] PCP: Briscoe Deutscher, DO  Chief Complaint  Patient presents with  . Right Foot - Follow-up      HPI: Patient presents follow-up status post stress fracture through the metaphyseal bone base of the fifth metatarsal right foot. Patient states she is feeling better but still has some tenderness.  Assessment & Plan: Visit Diagnoses:  1. Closed nondisplaced fracture of fifth metatarsal bone of right foot with delayed healing, subsequent encounter   2. Idiopathic chronic venous hypertension of both lower extremities with ulcer and inflammation (HCC)     Plan: Recommended vitamin D3 supplements with recommended weightbearing exercises continue with the postoperative shoe follow-up in 4 weeks with repeat 3 view radiographs of the right foot.  Follow-Up Instructions: Return in about 4 weeks (around 10/16/2017).   Ortho Exam  Patient is alert, oriented, no adenopathy, well-dressed, normal affect, normal respiratory effort. Examination patient does have varicose veins with swelling and brawny skin color changes to both legs. She has a venous ulcer proximal lateral aspect of the right calf. She is status post a precancerous excision of a lesion in the medial aspect of her calf and this has completely healed. The fracture site is minimally tender to palpation.  Imaging: No results found. No images are attached to the encounter.  Labs: Lab Results  Component Value Date   ESRSEDRATE 74 (H) 05/17/2017   CRP 2.0 05/17/2017   REPTSTATUS 04/30/2017 FINAL 04/25/2017   CULT  04/25/2017    NO GROWTH 5 DAYS Performed at [facility], [place] [address] [place], [place] [address]     Orders:  Orders Placed This Encounter  Procedures  . XR Foot Complete Right   No  orders of the defined types were placed in this encounter.    Procedures: No procedures performed  Clinical Data: No additional findings.  ROS:  All other systems negative, except as noted in the HPI. Review of Systems  Objective: Vital Signs: There were no vitals taken for this visit.  Specialty Comments:  No specialty comments available.  PMFS History: Patient Active Problem List   Diagnosis Date Noted  . 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 Closed nondisplaced fracture of fifth right metatarsal bone 09/18/2017  . Bilateral lower extremity edema 06/30/2017  . Physical deconditioning 05/30/2017  . Immunosuppressed status ([place]) 05/30/2017  . Abnormal chest x-ray 05/17/2017  . PNA (pneumonia) 04/25/2017  . Fibromyalgia 12/18/2016  . DJD (degenerative joint disease), cervical 12/18/2016  . Spondylosis of lumbar region without myelopathy or radiculopathy 12/18/2016  . Trigger finger, right middle finger 12/18/2016  . Primary osteoarthritis of both hands 12/18/2016  . Primary osteoarthritis of both feet 12/18/2016  . Primary osteoarthritis of both knees 12/18/2016  . GERD (gastroesophageal reflux disease) 12/18/2016  . Age-related osteoporosis without current pathological fracture 12/18/2016  . High risk medication use 12/18/2016  . Rheumatoid arthritis ([place]) 12/28/2015  . Long term current use of systemic steroids 12/28/2015  . Lymphocytosis 11/13/2012   Past Medical History:  Diagnosis Date  . Adrenal failure ([place])   . Arthritis   .  Cataract   . Fibromyalgia 2008  . Osteoporosis   . RA (rheumatoid arthritis) (H[facility])     Family History  Problem Relation Age of Onset  . Breast cancer Mother   . Diabetes Father     Past Surgical History:  Procedure Laterality Date  . BACK SURGERY    . BILATERAL CARPAL TUNNEL RELEASE    . CATARACT EXTRACTION, BILATERAL  2004  . CERVICAL FUSION   2011,2010,2008  . HEEL SPUR SURGERY  2004  . ROTATOR CUFF REPAIR    . TONSILLECTOMY AND ADENOIDECTOMY  1947  . TOTAL SHOULDER ARTHROPLASTY     Social History   Occupational History  . Not on file.   Social History Main Topics  . Smoking status: Former Smoker    Packs/day: 0.75    Years: 10.00    Types: Cigarettes    Quit date: 1968  . Smokeless tobacco: Never Used  . Alcohol use No  . Drug use: No  . Sexual activity: Not on file

## 2017-09-30 ENCOUNTER — Encounter: Payer: Self-pay | Admitting: Internal Medicine

## 2017-09-30 ENCOUNTER — Encounter: Payer: Self-pay | Admitting: Family Medicine

## 2017-10-01 NOTE — Telephone Encounter (Signed)
She saw NP 07/11/17 and cxr cleared per NP notes. SO, ok frommy stand point to =restart methotrexate under rheum guidance. Can you please ensure followup with me  Thanks  Dr. Brand Males, M.D., [facility]C.P Pulmonary and Critical Care Medicine Staff Physician [facility] Pager: [phone], If no answer or between  15:00h - 7:00h: call [phone]  10/01/2017 5:00 PM

## 2017-10-01 NOTE — Telephone Encounter (Signed)
Received the following email from the patient:   "I stopped Methatrexate in May, on your advice, and have not restarted but am having a great deal of RA pain.  Can I restart it?"  Per her AVS from 05/30/17 "PLAN  - hold off methotrexate or reduce dose if possible - will send messsage to Dr Bo Merino  - ok for prednisone and plaquenil - repeat cxr in 6 weeks"  MR, please advise if she will need to follow up with Dr. Estanislado Pandy about this? Thanks!

## 2017-10-02 ENCOUNTER — Encounter: Payer: Self-pay | Admitting: Rheumatology

## 2017-10-03 ENCOUNTER — Other Ambulatory Visit: Payer: Self-pay | Admitting: Surgical

## 2017-10-03 MED ORDER — KETOCONAZOLE 2 % EX CREA: 1.0000 "application " | TOPICAL_CREAM | Freq: Every day | CUTANEOUS | 0 refills | Status: DC

## 2017-10-03 NOTE — Telephone Encounter (Signed)
Okay to resume methotrexate

## 2017-10-09 MED ORDER — METHOTREXATE (PF) 15 MG/0.4ML ~~LOC~~ SOAJ: 15.0000 mg | SUBCUTANEOUS | 0 refills | Status: DC

## 2017-10-09 NOTE — Telephone Encounter (Signed)
Last Visit: 06/27/17 Next Visit: 11/28/17 Labs: 07/16/17 stable   Okay to refill per Dr. Estanislado Pandy

## 2017-10-12 ENCOUNTER — Other Ambulatory Visit: Payer: Self-pay | Admitting: Rheumatology

## 2017-10-12 NOTE — Telephone Encounter (Signed)
Last visit: 06/27/2017 Next visit: 11/28/2017 UDS: 02/22/2017 Narc agreement 02/22/2017  Okay to refill Tramadol?

## 2017-10-12 NOTE — Telephone Encounter (Signed)
ok 

## 2017-10-16 ENCOUNTER — Encounter (INDEPENDENT_AMBULATORY_CARE_PROVIDER_SITE_OTHER): Payer: Self-pay | Admitting: Orthopedic Surgery

## 2017-10-16 ENCOUNTER — Ambulatory Visit (INDEPENDENT_AMBULATORY_CARE_PROVIDER_SITE_OTHER): Payer: Medicare Other | Admitting: Orthopedic Surgery

## 2017-10-16 ENCOUNTER — Ambulatory Visit (INDEPENDENT_AMBULATORY_CARE_PROVIDER_SITE_OTHER): Payer: Medicare Other

## 2017-10-16 DIAGNOSIS — S92354G Nondisplaced fracture of fifth metatarsal bone, right foot, subsequent encounter for fracture with delayed healing: Secondary | ICD-10-CM

## 2017-10-16 NOTE — Progress Notes (Signed)
Office Visit Note   Patient: Eileen Wilkerson           Date of Birth: 1932-06-21           MRN: [id number] Visit Date: 10/16/2017              Requested by: Briscoe Deutscher, [place], [place] [address] PCP: Briscoe Deutscher, DO  Chief Complaint  Patient presents with  . Right Foot - Follow-up      HPI: Patient is a 83 year old woman status post nondisplaced fracture base of the fifth metatarsal right foot she is currently wearing regular shoe wear is asymptomatic.  Assessment & Plan: Visit Diagnoses:  1. Closed nondisplaced fracture of fifth metatarsal bone of right foot with delayed healing, subsequent encounter     Plan: Continue with regular shoewear no restrictions  Follow-Up Instructions: Return if symptoms worsen or fail to improve.   Ortho Exam  Patient is alert, oriented, no adenopathy, well-dressed, normal affect, normal respiratory effort. Patient has a normal gait no pain with weightbearing.  Imaging: No results found. No images are attached to the encounter.  Labs: Lab Results  Component Value Date   ESRSEDRATE 74 (H) 05/17/2017   CRP 2.0 05/17/2017   REPTSTATUS 04/30/2017 FINAL 04/25/2017   CULT  04/25/2017    NO GROWTH 5 DAYS Performed at [facility], [place] [address], [place], [place] [address]     Orders:  Orders Placed This Encounter  Procedures  . XR Foot Complete Right   No orders of the defined types were placed in this encounter.    Procedures: No procedures performed  Clinical Data: No additional findings.  ROS:  All other systems negative, except as noted in the HPI. Review of Systems  Objective: Vital Signs: There were no vitals taken for this visit.  Specialty Comments:  No specialty comments available.  PMFS History: Patient Active Problem List   Diagnosis Date Noted  . Idiopathic chronic venous hypertension of both lower extremities with ulcer and inflammation ([place]) 09/18/2017  . Closed  nondisplaced fracture of fifth right metatarsal bone 09/18/2017  . Bilateral lower extremity edema 06/30/2017  . Physical deconditioning 05/30/2017  . Immunosuppressed status ([place]) 05/30/2017  . Abnormal chest x-ray 05/17/2017  . PNA (pneumonia) 04/25/2017  . Fibromyalgia 12/18/2016  . DJD (degenerative joint disease), cervical 12/18/2016  . Spondylosis of lumbar region without myelopathy or radiculopathy 12/18/2016  . Trigger finger, right middle finger 12/18/2016  . Primary osteoarthritis of both hands 12/18/2016  . Primary osteoarthritis of both feet 12/18/2016  . Primary osteoarthritis of both knees 12/18/2016  . GERD (gastroesophageal reflux disease) 12/18/2016  . Age-related osteoporosis without current pathological fracture 12/18/2016  . High risk medication use 12/18/2016  . Rheumatoid arthritis ([place]) 12/28/2015  . Long term current use of systemic steroids 12/28/2015  . Lymphocytosis 11/13/2012   Past Medical History:  Diagnosis Date  . Adrenal failure ([place])   . Arthritis   . Cataract   . Fibromyalgia 2008  . Osteoporosis   . RA (rheumatoid arthritis) ([facility])     Family History  Problem Relation Age of Onset  . Breast cancer Mother   . Diabetes Father     Past Surgical History:  Procedure Laterality Date  . BACK SURGERY    . BILATERAL CARPAL TUNNEL RELEASE    . CATARACT EXTRACTION, BILATERAL  2004  . CERVICAL FUSION  2011,2010,2008  . HEEL SPUR SURGERY  2004  . ROTATOR CUFF REPAIR    .  TONSILLECTOMY AND ADENOIDECTOMY  1947  . TOTAL SHOULDER ARTHROPLASTY     Social History   Occupational History  . Not on file  Tobacco Use  . Smoking status: Former Smoker    Packs/day: 0.75    Years: 10.00    Pack years: 7.50    Types: Cigarettes    Last attempt to quit: 1968    Years since quitting: 50.8  . Smokeless tobacco: Never Used  Substance and Sexual Activity  . Alcohol use: No  . Drug use: No  . Sexual activity: Not on file

## 2017-10-17 ENCOUNTER — Other Ambulatory Visit: Payer: Self-pay | Admitting: Family Medicine

## 2017-10-17 DIAGNOSIS — M797 Fibromyalgia: Secondary | ICD-10-CM

## 2017-11-13 ENCOUNTER — Other Ambulatory Visit: Payer: Self-pay

## 2017-11-13 DIAGNOSIS — Z79899 Other long term (current) drug therapy: Secondary | ICD-10-CM

## 2017-11-13 LAB — CBC WITH DIFFERENTIAL/PLATELET
Basophils Absolute: 59 cells/uL (ref 0–200)
Basophils Relative: 0.5 %
Eosinophils Absolute: 35 cells/uL (ref 15–500)
Eosinophils Relative: 0.3 %
HCT: 37.1 % (ref 35.0–45.0)
Hemoglobin: 12.8 g/dL (ref 11.7–15.5)
Lymphs Abs: 1522 cells/uL (ref 850–3900)
MCH: 32.2 pg (ref 27.0–33.0)
MCHC: 34.5 g/dL (ref 32.0–36.0)
MCV: 93.2 fL (ref 80.0–100.0)
MPV: 9.9 fL (ref 7.5–12.5)
Monocytes Relative: 5.2 %
Neutro Abs: 9570 cells/uL — ABNORMAL HIGH (ref 1500–7800)
Neutrophils Relative %: 81.1 %
Platelets: 345 10*3/uL (ref 140–400)
RBC: 3.98 10*6/uL (ref 3.80–5.10)
RDW: 13.1 % (ref 11.0–15.0)
Total Lymphocyte: 12.9 %
WBC mixed population: 614 cells/uL (ref 200–950)
WBC: 11.8 10*3/uL — ABNORMAL HIGH (ref 3.8–10.8)

## 2017-11-13 LAB — COMPLETE METABOLIC PANEL WITH GFR
AG Ratio: 1.3 (calc) (ref 1.0–2.5)
ALT: 18 U/L (ref 6–29)
AST: 25 U/L (ref 10–35)
Albumin: 4.3 g/dL (ref 3.6–5.1)
Alkaline phosphatase (APISO): 57 U/L (ref 33–130)
BUN/Creatinine Ratio: 21 (calc) (ref 6–22)
BUN: 20 mg/dL (ref 7–25)
CO2: 34 mmol/L — ABNORMAL HIGH (ref 20–32)
Calcium: 10 mg/dL (ref 8.6–10.4)
Chloride: 101 mmol/L (ref 98–110)
Creat: 0.95 mg/dL — ABNORMAL HIGH (ref 0.60–0.88)
GFR, Est African American: 64 mL/min/{1.73_m2} (ref >=60)
GFR, Est Non African American: 55 mL/min/{1.73_m2} — ABNORMAL LOW (ref >=60)
Globulin: 3.4 g/dL (calc) (ref 1.9–3.7)
Glucose, Bld: 125 mg/dL — ABNORMAL HIGH (ref 65–99)
Potassium: 4.2 mmol/L (ref 3.5–5.3)
Sodium: 145 mmol/L (ref 135–146)
Total Bilirubin: 0.6 mg/dL (ref 0.2–1.2)
Total Protein: 7.7 g/dL (ref 6.1–8.1)

## 2017-11-14 NOTE — Progress Notes (Signed)
Labs are stable.

## 2017-11-15 NOTE — Progress Notes (Signed)
Office Visit Note  Patient: Eileen Wilkerson             Date of Birth: [birth year]           MRN: [id number]             PCP: Briscoe Deutscher, DO Referring: Briscoe Deutscher, DO Visit Date: 11/28/2017 Occupation: @GUAROCC @    Subjective:  Bilateral knee pain    History of Present Illness: Eileen Wilkerson is a 83 y.o. female with a history of rheumatoid arthritis, osteoarthritis, and fibromyalgia.  Patient states she fractured her right fifth metatarsal twice this year. Her last x-ray was 10/16/17 which showed a stable healing metaphyseal fracture.  She states she still experiences pain in this foot.  She states she is concerned that her osteoporosis is worsening. She has not had a bone density scan recently due to scheduling conflicts.  She is going to contact her PCP for scheduling and to restart Reclast.  She was due for her reclast infusion in May 2018 but she did not get it because she had just been discharged from the hospital with pneumonia.  She states that she was taken off her Methotrexate by Dr. Chase Caller due to the pneumonia.  She was restarted on MTX 15 mg week about 6 weeks ago.  She feels her RA in her hands has been not under control.  She states she is currently on Prednisone 8 mg.  She takes Tramadol twice daily for pain relief.  She states her fibromyalgia has been flaring and she is having worsening fatigue.  She continues to have bilateral knee pain but denies any swelling.  She also has pain in her feet.     Activities of Daily Living:  Patient reports morning stiffness for 3-4 hours.   Patient Reports nocturnal pain.  Difficulty dressing/grooming: Denies Difficulty climbing stairs: Reports Difficulty getting out of chair: Denies Difficulty using hands for taps, buttons, cutlery, and/or writing: Reports   Review of Systems  Constitutional: Positive for fatigue. Negative for weakness.  HENT: Positive for mouth sores. Negative for mouth dryness and nose dryness.     Eyes: Positive for dryness. Negative for pain.  Respiratory: Negative for cough, hemoptysis, shortness of breath and difficulty breathing.   Cardiovascular: Negative for chest pain, palpitations, hypertension, irregular heartbeat and swelling in legs/feet.  Gastrointestinal: Positive for constipation and diarrhea. Negative for blood in stool.  Endocrine: Negative for increased urination.  Genitourinary: Negative for painful urination.  Musculoskeletal: Positive for arthralgias, joint pain, muscle weakness, morning stiffness and muscle tenderness. Negative for joint swelling, myalgias and myalgias.  Skin: Negative for color change, pallor, rash, hair loss, nodules/bumps, redness, skin tightness, ulcers and sensitivity to sunlight.  Neurological: Negative for dizziness, numbness and headaches.  Hematological: Negative for swollen glands.  Psychiatric/Behavioral: Positive for depressed mood. Negative for sleep disturbance. The patient is not nervous/anxious.     PMFS History:  Patient Active Problem List   Diagnosis Date Noted  . Idiopathic chronic venous hypertension of both lower extremities with ulcer and inflammation ([place]) 09/18/2017  . Closed nondisplaced fracture of fifth right metatarsal bone 09/18/2017  . Bilateral lower extremity edema 06/30/2017  . Physical deconditioning 05/30/2017  . Immunosuppressed status ([place]) 05/30/2017  . Abnormal chest x-ray 05/17/2017  . PNA (pneumonia) 04/25/2017  . Fibromyalgia 12/18/2016  . DJD (degenerative joint disease), cervical 12/18/2016  . Spondylosis of lumbar region without myelopathy or radiculopathy 12/18/2016  . Trigger finger, right middle finger 12/18/2016  . Primary osteoarthritis  of both hands 12/18/2016  . Primary osteoarthritis of both feet 12/18/2016  . Primary osteoarthritis of both knees 12/18/2016  . GERD (gastroesophageal reflux disease) 12/18/2016  . Age-related osteoporosis without current pathological fracture 12/18/2016   . High risk medication use 12/18/2016  . Rheumatoid arthritis ([place]) 12/28/2015  . Long term current use of systemic steroids 12/28/2015  . Lymphocytosis 11/13/2012    Past Medical History:  Diagnosis Date  . Adrenal failure ([place])   . Arthritis   . Cataract   . Fibromyalgia 2008  . Osteoporosis   . RA (rheumatoid arthritis) ([facility])     Family History  Problem Relation Age of Onset  . Breast cancer Mother   . Diabetes Father    Past Surgical History:  Procedure Laterality Date  . BACK SURGERY    . BILATERAL CARPAL TUNNEL RELEASE    . CATARACT EXTRACTION, BILATERAL  2004  . CERVICAL FUSION  2011,2010,2008  . HEEL SPUR SURGERY  2004  . ROTATOR CUFF REPAIR    . TONSILLECTOMY AND ADENOIDECTOMY  1947  . TOTAL SHOULDER ARTHROPLASTY     Social History   Social History Narrative  . Not on file     Objective: Vital Signs: BP 118/64 (BP Location: Left Arm, Patient Position: Sitting, Cuff Size: Normal)   Pulse 72   Resp 18   Ht 5' 4.5" (1.638 m)   Wt 175 lb (79.4 kg)   BMI 29.57 kg/m    Physical Exam  Constitutional: She is oriented to person, place, and time. She appears well-developed and well-nourished.  HENT:  Head: Normocephalic and atraumatic.  Eyes: Conjunctivae and EOM are normal.  Neck: Normal range of motion.  Cardiovascular: Normal rate, regular rhythm, normal heart sounds and intact distal pulses.  Pulmonary/Chest: Effort normal and breath sounds normal.  Abdominal: Soft. Bowel sounds are normal.  Lymphadenopathy:    She has no cervical adenopathy.  Neurological: She is alert and oriented to person, place, and time.  Skin: Skin is warm and dry. Capillary refill takes less than 2 seconds.  Psychiatric: She has a normal mood and affect. Her behavior is normal.  Nursing note and vitals reviewed.    Musculoskeletal Exam: C-spine, thoracic, and lumbar spine good ROM.  Shoulder joints, elbow joints, and wrists good ROM.  MCPs, PIPs, and DIPs good ROM with no  synovitis.  Hip joints good ROM.  Bilateral trochanteric bursa tenderness.  Bilateral knee pain with discomfort.  No knee effusion or warmth.  MTPs, PIPs, and DIPs good ROM.  No midline spinal tenderness.    CDAI Exam: CDAI Homunculus Exam:   Joint Counts:  CDAI Tender Joint count: 0 CDAI Swollen Joint count: 0     Investigation: No additional findings.PLQ eye exam: 10/18/2017 UDS: 02/22/2017 Narc agreement 02/22/2017 CBC Latest Ref Rng & Units 11/13/2017 07/16/2017 05/17/2017  WBC 3.8 - 10.8 Thousand/uL 11.8(H) 8.7 13.5(H)  Hemoglobin 11.7 - 15.5 g/dL 12.8 12.9 13.4  Hematocrit 35.0 - 45.0 % 37.1 39.6 40.9  Platelets 140 - 400 Thousand/uL 345 309 342.0   CMP Latest Ref Rng & Units 11/13/2017 07/16/2017 05/17/2017  Glucose 65 - 99 mg/dL 125(H) 102(H) 129(H)  BUN 7 - 25 mg/dL 20 17 17   Creatinine 0.60 - 0.88 mg/dL 0.95(H) 0.93(H) 1.04  Sodium 135 - 146 mmol/L 145 140 141  Potassium 3.5 - 5.3 mmol/L 4.2 4.2 3.3(L)  Chloride 98 - 110 mmol/L 101 100 97  CO2 20 - 32 mmol/L 34(H) 29 32  Calcium 8.6 - 10.4  mg/dL 10.0 9.5 10.8(H)  Total Protein 6.1 - 8.1 g/dL 7.7 7.3 8.6(H)  Total Bilirubin 0.2 - 1.2 mg/dL 0.6 0.4 0.5  Alkaline Phos 33 - 130 U/L - 65 59  AST 10 - 35 U/L 25 21 27   ALT 6 - 29 U/L 18 14 16     Imaging: Xr Hand 2 View Left  Result Date: 11/28/2017 Juxta-articular osteopenia noted.  DIP, PIP, and CMC joint space narrowing.  No MCP joint space narrowing.  No erosive changes.  Impression: Findings consistent with osteoarthritis and rheumatoid arthritis overlap     Xr Hand 2 View Right  Result Date: 11/28/2017 Juxta-articular osteopenia noted.  DIP, PIP, and CMC joint space narrowing.  No MCP joint space narrowing.  No erosive changes.  Impression: Findings consistent with osteoarthritis and rheumatoid arthritis overlap    Xr Knee 3 View Left  Result Date: 11/28/2017 Severe medial joint.  Medial osteophytes present.  Severe patellofemoral narrowing. No chondrocalcinosis.   Impression: Findings consistent with severe osteoarthritis   Xr Knee 3 View Right  Result Date: 11/28/2017 Severe medial joint.  Medial osteophytes present.  Severe patellofemoral narrowing. No chondrocalcinosis.  Impression: Findings consistent with severe osteoarthritis    Speciality Comments: PLQ eye exam: 10/18/2017 Normal. Dr. Ricki Miller. Follow up in 6 months.    Procedures:  Large Joint Inj: R knee on 11/28/2017 12:06 PM Indications: pain Details: 1.5 in medial approach  Arthrogram: No  Medications: 1.5 mL lidocaine 1 %; 40 mg triamcinolone acetonide 40 MG/ML Aspirate: 0 mL Outcome: tolerated well, no immediate complications Consent was given by the patient. Immediately prior to procedure a time out was called to verify the correct patient, procedure, equipment, support staff and site/side marked as required. Patient was prepped and draped in the usual sterile fashion.     Allergies: Adhesive [tape]; Celebrex [celecoxib]; Ciprofibrate; Cymbalta [duloxetine hcl]; Gabitril [tiagabine]; Keflex [cephalexin]; Lyrica [pregabalin]; Neurontin [gabapentin]; Nexium [esomeprazole]; Nsaids; Penicillins; Shrimp [shellfish allergy]; Sulfa antibiotics; Ciprofloxacin; and Claritin-d 12 hour [loratadine-pseudoephedrine er]   Assessment / Plan:     Visit Diagnoses: Rheumatoid arthritis involving multiple sites with positive rheumatoid factor ([place]): Patient has no synovitis on exam.  She will continue on Plaquenil 200 mg once daily, Methotrexate 15 mg injection, and Prednisone 8 mg.    High risk medication use - PLQ, MTX, Reclast, prednisone, tramadol eye exam: 11/8/2018UDS: 02/22/2017 Narc agreement 02/22/2017.  CBC and CMP will be drawn in March and every 3 months following to monitor for drug toxicity.     Primary osteoarthritis of both hands: PIP and DIP synovial thickening bilaterally.  X-rays reveal PIP and DIP and CMC joint space narrowing.  Discussed joint protection and muscle  strengthening.    Primary osteoarthritis of both knees: Bilateral pain.  No effusion or warmth on exam.  A cortisone injection of her right knee was performed today.  She will be returning in 2-3 weeks for a left knee cortisone injection.    Primary osteoarthritis of both feet: Proper fitting shoes discussed.    DDD (degenerative disc disease), cervical: Good ROM with minimal discomfort.    Age-related osteoporosis without current pathological fracture - She is going to call her PCP to schedule bone density.  She needs to restart her reclast infusions.  Her last infusion was supposed to be May 2018, but she did not go to the infusion due to being discharged from the hospital around that time.    Fibromyalgia: She state she has been having a fibromyalgia flare the past  2 days.  She has significant fatigue.  She has been sleeping better.  Discussed good sleep hygiene and the importance of exercise.    Long term current use of systemic steroids - Due to adrenal insufficiency. She is currently taking Prednisone 8 mg daily.    History of fatigue - due to deconditioning.  Pain in both hands - X-ray reveal findings of juxta-articular osteopenia and DIP/PIP joint space narrowing consistent with osteoarthritis and rheumatoid arthritis overlap. Plan: XR Hand 2 View Left, XR Hand 2 View Right  Chronic pain of both knees - X-rays reveal bilateral severe osteoarthritis.  Her right knee was injected with cortisone today in the office.  The risks and benefits were discussed and she tolerated the procedure well.  She is going to return in 2-3 weeks for the left knee cortisone injection due to her adrenal insufficiency. Plan: XR KNEE 3 VIEW LEFT, XR KNEE 3 VIEW RIGHT    Orders: Orders Placed This Encounter  Procedures  . Large Joint Inj: R knee  . XR KNEE 3 VIEW LEFT  . XR KNEE 3 VIEW RIGHT  . XR Hand 2 View Left  . XR Hand 2 View Right   No orders of the defined types were placed in this  encounter.    Follow-Up Instructions: Return for Rheumatoid arthritis, Osteoarthritis, Fibromyalgia.  Bo Merino, MD  Note - This record has been created using Editor, commissioning.  Chart creation errors have been sought, but may not always  have been located. Such creation errors do not reflect on  the standard of medical care.

## 2017-11-16 ENCOUNTER — Other Ambulatory Visit: Payer: Self-pay | Admitting: Rheumatology

## 2017-11-16 NOTE — Telephone Encounter (Signed)
ok 

## 2017-11-16 NOTE — Telephone Encounter (Signed)
Last visit: 06/27/2017 Next visit: 11/28/2017 UDS: 02/22/2017 Narc agreement 02/22/2017  Okay to refill Tramadol and Prednisone?

## 2017-11-28 ENCOUNTER — Ambulatory Visit (INDEPENDENT_AMBULATORY_CARE_PROVIDER_SITE_OTHER): Payer: Self-pay

## 2017-11-28 ENCOUNTER — Ambulatory Visit: Payer: Medicare Other | Admitting: Rheumatology

## 2017-11-28 ENCOUNTER — Other Ambulatory Visit: Payer: Self-pay | Admitting: Family Medicine

## 2017-11-28 ENCOUNTER — Encounter: Payer: Self-pay | Admitting: Rheumatology

## 2017-11-28 VITALS — BP 118/64 | HR 72 | Resp 18 | Ht 64.5 in | Wt 175.0 lb

## 2017-11-28 DIAGNOSIS — Z7952 Long term (current) use of systemic steroids: Secondary | ICD-10-CM

## 2017-11-28 DIAGNOSIS — M25562 Pain in left knee: Secondary | ICD-10-CM | POA: Diagnosis not present

## 2017-11-28 DIAGNOSIS — Z87898 Personal history of other specified conditions: Secondary | ICD-10-CM

## 2017-11-28 DIAGNOSIS — M810 Age-related osteoporosis without current pathological fracture: Secondary | ICD-10-CM

## 2017-11-28 DIAGNOSIS — Z79899 Other long term (current) drug therapy: Secondary | ICD-10-CM

## 2017-11-28 DIAGNOSIS — M19071 Primary osteoarthritis, right ankle and foot: Secondary | ICD-10-CM | POA: Diagnosis not present

## 2017-11-28 DIAGNOSIS — M19041 Primary osteoarthritis, right hand: Secondary | ICD-10-CM

## 2017-11-28 DIAGNOSIS — G8929 Other chronic pain: Secondary | ICD-10-CM | POA: Diagnosis not present

## 2017-11-28 DIAGNOSIS — M19072 Primary osteoarthritis, left ankle and foot: Secondary | ICD-10-CM

## 2017-11-28 DIAGNOSIS — M0579 Rheumatoid arthritis with rheumatoid factor of multiple sites without organ or systems involvement: Secondary | ICD-10-CM | POA: Diagnosis not present

## 2017-11-28 DIAGNOSIS — Z1231 Encounter for screening mammogram for malignant neoplasm of breast: Secondary | ICD-10-CM

## 2017-11-28 DIAGNOSIS — M170 Bilateral primary osteoarthritis of knee: Secondary | ICD-10-CM | POA: Diagnosis not present

## 2017-11-28 DIAGNOSIS — M5030 Other cervical disc degeneration, unspecified cervical region: Secondary | ICD-10-CM | POA: Diagnosis not present

## 2017-11-28 DIAGNOSIS — M797 Fibromyalgia: Secondary | ICD-10-CM | POA: Diagnosis not present

## 2017-11-28 DIAGNOSIS — M79641 Pain in right hand: Secondary | ICD-10-CM

## 2017-11-28 DIAGNOSIS — M79642 Pain in left hand: Secondary | ICD-10-CM

## 2017-11-28 DIAGNOSIS — M25561 Pain in right knee: Secondary | ICD-10-CM

## 2017-11-28 DIAGNOSIS — M19042 Primary osteoarthritis, left hand: Secondary | ICD-10-CM

## 2017-11-28 MED ORDER — LIDOCAINE HCL 1 % IJ SOLN
1.5000 mL | INTRAMUSCULAR | Status: AC | PRN
  Administered 2017-11-28: 1.5 mL

## 2017-11-28 MED ORDER — TRIAMCINOLONE ACETONIDE 40 MG/ML IJ SUSP
40.0000 mg | INTRAMUSCULAR | Status: AC | PRN
  Administered 2017-11-28: 40 mg via INTRA_ARTICULAR

## 2017-11-28 NOTE — Patient Instructions (Signed)
Standing Labs We placed an order today for your standing lab work.    Please come back and get your standing labs in March and every 3 months following  We have open lab Monday through Friday from 8:30-11:30 AM and 1:30-4 PM at the office of Dr. Bo Merino.   The office is located at [address], [employer], [place], [place] [address] No appointment is necessary.   Labs are drawn by [facility].  You may receive a bill from [facility] for your lab work. If you have any questions regarding directions or hours of operation,  please call [phone].

## 2017-12-17 ENCOUNTER — Other Ambulatory Visit: Payer: Self-pay | Admitting: Rheumatology

## 2017-12-17 NOTE — Telephone Encounter (Signed)
ok 

## 2017-12-17 NOTE — Telephone Encounter (Signed)
Last visit: 11/28/2017 Next visit: 12/19/2017 UDS: 02/22/2017 Narc agreement: 02/22/2017  Okay to refill Tramadol?

## 2017-12-19 ENCOUNTER — Ambulatory Visit: Payer: Medicare Other | Admitting: Rheumatology

## 2017-12-19 VITALS — BP 130/72 | HR 81

## 2017-12-19 DIAGNOSIS — M170 Bilateral primary osteoarthritis of knee: Secondary | ICD-10-CM

## 2017-12-19 MED ORDER — LIDOCAINE HCL (PF) 1 % IJ SOLN
1.5000 mL | INTRAMUSCULAR | Status: AC | PRN
  Administered 2017-12-19: 1.5 mL

## 2017-12-19 MED ORDER — TRIAMCINOLONE ACETONIDE 40 MG/ML IJ SUSP
40.0000 mg | INTRAMUSCULAR | Status: AC | PRN
  Administered 2017-12-19: 40 mg via INTRA_ARTICULAR

## 2017-12-19 NOTE — Progress Notes (Signed)
   Procedure Note  Patient: Eileen Wilkerson             Date of Birth: 1932/03/16           MRN: [id number]             Visit Date: 12/19/2017  Procedures: Visit Diagnoses: Primary osteoarthritis of both knees  Large Joint Inj: L knee on 12/19/2017 3:43 PM Indications: pain Details: 27 G 1.5 in needle, medial approach  Arthrogram: No  Medications: 1.5 mL lidocaine (PF) 1 %; 40 mg triamcinolone acetonide 40 MG/ML Aspirate: 0 mL Outcome: tolerated well, no immediate complications Procedure, treatment alternatives, risks and benefits explained, specific risks discussed. Consent was given by the patient. Immediately prior to procedure a time out was called to verify the correct patient, procedure, equipment, support staff and site/side marked as required. Patient was prepped and draped in the usual sterile fashion.     Bo Merino, MD

## 2017-12-26 ENCOUNTER — Other Ambulatory Visit: Payer: Self-pay

## 2017-12-26 MED ORDER — FLUTICASONE PROPIONATE 50 MCG/ACT NA SUSP: 2.0000 | Freq: Every day | NASAL | 0 refills | Status: DC

## 2017-12-27 ENCOUNTER — Ambulatory Visit
Admission: RE | Admit: 2017-12-27 | Discharge: 2017-12-27 | Disposition: A | Discharge status: Home / Self Care | Payer: Medicare Other | Source: Ambulatory Visit | Attending: Family Medicine | Admitting: Family Medicine

## 2017-12-27 ENCOUNTER — Telehealth: Payer: Self-pay | Admitting: Surgical

## 2017-12-27 ENCOUNTER — Ambulatory Visit: Payer: Medicare Other

## 2017-12-27 DIAGNOSIS — M810 Age-related osteoporosis without current pathological fracture: Secondary | ICD-10-CM

## 2017-12-27 DIAGNOSIS — E2839 Other primary ovarian failure: Secondary | ICD-10-CM

## 2017-12-27 DIAGNOSIS — Z1231 Encounter for screening mammogram for malignant neoplasm of breast: Secondary | ICD-10-CM

## 2017-12-27 NOTE — Telephone Encounter (Signed)
I spoke with patient about medication Tramadol and Savella. Insurance sent in a letter concerned for patient taking both. Patient stated that they wrote Dr. Estanislado Pandy as well and she cut down on the Tramadol. I explained that if she starts having any headaches or palpations she should stop taking per Dr. Juleen China. Patient verbalized understanding and stated that she is not having any of the symptoms.

## 2018-01-03 ENCOUNTER — Other Ambulatory Visit: Payer: Self-pay | Admitting: Rheumatology

## 2018-01-03 NOTE — Telephone Encounter (Signed)
Last Visit: 12/19/17 Next Visit: 04/04/18 Labs: 11/13/17 stable  Okay to refill per Dr. Estanislado Pandy

## 2018-01-08 ENCOUNTER — Other Ambulatory Visit: Payer: Self-pay | Admitting: Family Medicine

## 2018-01-14 ENCOUNTER — Ambulatory Visit: Payer: Medicare Other | Admitting: Family Medicine

## 2018-01-14 ENCOUNTER — Ambulatory Visit: Payer: Medicare Other | Admitting: *Deleted

## 2018-01-14 ENCOUNTER — Encounter: Payer: Self-pay | Admitting: Family Medicine

## 2018-01-14 VITALS — BP 140/80 | HR 66 | Temp 97.7°F | Wt 167.6 lb

## 2018-01-14 DIAGNOSIS — I87333 Chronic venous hypertension (idiopathic) with ulcer and inflammation of bilateral lower extremity: Secondary | ICD-10-CM

## 2018-01-14 DIAGNOSIS — M0579 Rheumatoid arthritis with rheumatoid factor of multiple sites without organ or systems involvement: Secondary | ICD-10-CM | POA: Diagnosis not present

## 2018-01-14 DIAGNOSIS — L97919 Non-pressure chronic ulcer of unspecified part of right lower leg with unspecified severity: Secondary | ICD-10-CM | POA: Diagnosis not present

## 2018-01-14 DIAGNOSIS — E782 Mixed hyperlipidemia: Secondary | ICD-10-CM

## 2018-01-14 DIAGNOSIS — D899 Disorder involving the immune mechanism, unspecified: Secondary | ICD-10-CM | POA: Diagnosis not present

## 2018-01-14 DIAGNOSIS — D849 Immunodeficiency, unspecified: Secondary | ICD-10-CM

## 2018-01-14 DIAGNOSIS — N3000 Acute cystitis without hematuria: Secondary | ICD-10-CM

## 2018-01-14 DIAGNOSIS — L97929 Non-pressure chronic ulcer of unspecified part of left lower leg with unspecified severity: Secondary | ICD-10-CM | POA: Diagnosis not present

## 2018-01-14 DIAGNOSIS — R5081 Fever presenting with conditions classified elsewhere: Secondary | ICD-10-CM | POA: Diagnosis not present

## 2018-01-14 LAB — POC URINALSYSI DIPSTICK (AUTOMATED)
Bilirubin, UA: NEGATIVE
Glucose, UA: NEGATIVE
Ketones, UA: NEGATIVE
Nitrite, UA: NEGATIVE
Protein, UA: NEGATIVE
Spec Grav, UA: 1.015 (ref 1.010–1.025)
Urobilinogen, UA: 0.2 E.U./dL
pH, UA: 7 (ref 5.0–8.0)

## 2018-01-14 LAB — POC INFLUENZA A&B (BINAX/QUICKVUE)
Influenza A, POC: NEGATIVE
Influenza B, POC: NEGATIVE

## 2018-01-14 MED ORDER — NITROFURANTOIN MONOHYD MACRO 100 MG PO CAPS: 100.0000 mg | ORAL_CAPSULE | Freq: Two times a day (BID) | ORAL | 0 refills | Status: DC

## 2018-01-14 NOTE — Patient Instructions (Signed)
WEANING SAVELLA:  GO SLOWLY. STAY AT [facility] DOSE REDUCTION X 1-2 WEEKS.   TAKE 1 TAB IN THE MORNING, AND 1/2 TAB IN THE EVENING. TAKE 1/2 TAB IN THE MORNING, AND 1/2 IN THE EVENING. TAKE 1/2 TAB IN THE MORNING, AND NONE IN THE EVENING. TAKE 1/2 TAB EVERY OTHER DAY. OFF.  FOLLOW UP IN ONE TO TWO MONTHS.

## 2018-01-14 NOTE — Progress Notes (Signed)
Eileen Wilkerson is a 84 y.o. female is here for follow up.  History of Present Illness:   Eileen Wilkerson, CMA acting as scribe for Dr. Briscoe Deutscher.   Fever   This is a new problem. The current episode started yesterday. The problem occurs daily. The problem has been gradually worsening. The maximum temperature noted was 101 to 101.9 F. The temperature was taken using a tympanic thermometer. Associated symptoms include nausea. Pertinent negatives include no coughing or vomiting. She has tried acetaminophen for the symptoms. The treatment provided no relief.   Health Maintenance Due  Topic Date Due  . TETANUS/TDAP  09/07/1951  . INFLUENZA VACCINE  07/11/2017   Depression screen PHQ 2/9 05/08/2017  Decreased Interest 0  Down, Depressed, Hopeless 0  PHQ - 2 Score 0   PMHx, SurgHx, SocialHx, FamHx, Medications, and Allergies were reviewed in the Visit Navigator and updated as appropriate.   Patient Active Problem List   Diagnosis Date Noted  . Idiopathic chronic venous hypertension of both lower extremities with ulcer and inflammation ([place]) 09/18/2017  . Closed nondisplaced fracture of fifth right metatarsal bone 09/18/2017  . Bilateral lower extremity edema 06/30/2017  . Physical deconditioning 05/30/2017  . Immunosuppressed status ([place]) 05/30/2017  . Abnormal chest x-ray 05/17/2017  . PNA (pneumonia) 04/25/2017  . Fibromyalgia 12/18/2016  . DJD (degenerative joint disease), cervical 12/18/2016  . Spondylosis of lumbar region without myelopathy or radiculopathy 12/18/2016  . Trigger finger, right middle finger 12/18/2016  . Primary osteoarthritis of both hands 12/18/2016  . Primary osteoarthritis of both feet 12/18/2016  . Primary osteoarthritis of both knees 12/18/2016  . GERD (gastroesophageal reflux disease) 12/18/2016  . Age-related osteoporosis without current pathological fracture 12/18/2016  . High risk medication use 12/18/2016  . Rheumatoid arthritis ([place])  12/28/2015  . Long term current use of systemic steroids 12/28/2015  . Lymphocytosis 11/13/2012   Social History   Tobacco Use  . Smoking status: Former Smoker    Packs/day: 0.75    Years: 10.00    Pack years: 7.50    Types: Cigarettes    Last attempt to quit: 1968    Years since quitting: 51.1  . Smokeless tobacco: Never Used  Substance Use Topics  . Alcohol use: No  . Drug use: No   Current Medications and Allergies:   Current Outpatient Medications:  .  acetaminophen (TYLENOL) 650 MG CR tablet, Take 500 mg by mouth 3 (three) times daily. , Disp: , Rfl:  .  atorvastatin (LIPITOR) 20 MG tablet, TAKE 1 TABLET BY MOUTH ONCE DAILY AT BEDTIME AS NEEDED, Disp: 90 tablet, Rfl: 1 .  Biotin (BIOTIN MAXIMUM STRENGTH) 10 MG TABS, Take 10 mg by mouth daily., Disp: , Rfl:  .  Cholecalciferol (VITAMIN D3) 5000 units CAPS, Take 5,000 Units by mouth daily., Disp: , Rfl:  .  CRANBERRY PO, Take by mouth daily., Disp: , Rfl:  .  esomeprazole (NEXIUM) 20 MG packet, Take 20 mg by mouth 2 (two) times daily., Disp: , Rfl:  .  fluticasone (FLONASE) 50 MCG/ACT nasal spray, Place 2 sprays into both nostrils daily., Disp: 16 g, Rfl: 0 .  folic acid (FOLVITE) 1 MG tablet, Take 1 mg by mouth daily., Disp: , Rfl:  .  furosemide (LASIX) 80 MG tablet, Take 1 tablet (80 mg total) by mouth daily., Disp: 30 tablet, Rfl: 5 .  hydroxychloroquine (PLAQUENIL) 200 MG tablet, TAKE 1 TABLET BY MOUTH ONCE DAILY, Disp: 90 tablet, Rfl: 0 .  hydroxychloroquine (  PLAQUENIL) 200 MG tablet, TAKE 1 TABLET BY MOUTH ONCE DAILY (Patient not taking: Reported on 11/28/2017), Disp: 90 tablet, Rfl: 0 .  ketoconazole (NIZORAL) 2 % cream, Apply 1 application topically daily. (Patient not taking: Reported on 11/28/2017), Disp: 30 g, Rfl: 0 .  methocarbamol (ROBAXIN) 500 MG tablet, TAKE 1 TABLET BY MOUTH EVERY 6 TO 8 HOURS AS NEEDED FOR PAIN (Patient not taking: Reported on 11/28/2017), Disp: 90 tablet, Rfl: 2 .  OTREXUP 15 MG/0.4ML SOAJ,  INJECT ONE PEN SUBCUTANEOUSLY ONCE EVERY WEEK. STORE AT ROOM TEMPERATURE BETWEEN 68 - 94 DEGREES F., Disp: 12 pen, Rfl: 0 .  potassium chloride SA (K-DUR,KLOR-CON) 20 MEQ tablet, Take 1 tablet (20 mEq total) by mouth daily. (Patient not taking: Reported on 11/28/2017), Disp: 30 tablet, Rfl: 11 .  predniSONE (DELTASONE) 1 MG tablet, TAKE TWO TABLETS BY MOUTH ONCE DAILY, Disp: 180 tablet, Rfl: 0 .  predniSONE (DELTASONE) 5 MG tablet, TAKE 1 TABLET BY MOUTH ONCE DAILY WITH BREAKFAST, Disp: 90 tablet, Rfl: 0 .  PREVACID 24HR 15 MG capsule, Take 15 mg by mouth Daily., Disp: , Rfl:  .  SAVELLA 50 MG TABS tablet, TAKE 1 TABLET BY MOUTH TWICE DAILY, Disp: 60 tablet, Rfl: 3 .  traMADol (ULTRAM) 50 MG tablet, TAKE 1 TABLET BY MOUTH TWICE DAILY AS NEEDED, Disp: 60 tablet, Rfl: 0 .  Zoledronic Acid (RECLAST IV), Inject into the vein., Disp: , Rfl:    Allergies  Allergen Reactions  . Adhesive [Tape] Rash  . Celebrex [Celecoxib] Hives  . Ciprofibrate Nausea Only  . Cymbalta [Duloxetine Hcl] Swelling  . Gabitril [Tiagabine] Swelling  . Keflex [Cephalexin] Nausea And Vomiting  . Lyrica [Pregabalin] Swelling  . Neurontin [Gabapentin] Swelling  . Nexium [Esomeprazole] Rash  . Nsaids Rash  . Penicillins Rash    Injection site reaction Has patient had a PCN reaction causing immediate rash, facial/tongue/throat swelling, SOB or lightheadedness with hypotension: No Has patient had a PCN reaction causing severe rash involving mucus membranes or skin necrosis: No Has patient had a PCN reaction that required hospitalization No Has patient had a PCN reaction occurring within the last 10 years: No If all of the above answers are "NO", then may proceed with Cephalosporin use.   . Shrimp [Shellfish Allergy] Anaphylaxis    Per patient "shrimp only"  . Sulfa Antibiotics Nausea And Vomiting  . Ciprofloxacin Other (See Comments)    dizziness  . Claritin-D 12 Hour [Loratadine-Pseudoephedrine Er] Anxiety   Review  of Systems   Pertinent items are noted in the HPI. Otherwise, ROS is negative.  Vitals:   Vitals:   01/14/18 1400  BP: 140/80  Pulse: 66  Temp: 97.7 F (36.5 C)  TempSrc: Oral  SpO2: 98%  Weight: 167 lb 9.6 oz (76 kg)     Body mass index is 28.32 kg/m.  Physical Exam:   Physical Exam  Constitutional: She is oriented to person, place, and time. She appears well-developed and well-nourished. No distress.  HENT:  Head: Normocephalic and atraumatic.  Eyes: EOM are normal. Pupils are equal, round, and reactive to light.  Neck: Normal range of motion. Neck supple.  Cardiovascular: Normal rate, regular rhythm and intact distal pulses.  Pulmonary/Chest: Effort normal.  Abdominal: Soft.  Neurological: She is alert and oriented to person, place, and time.  Skin: Skin is warm.  Psychiatric: She has a normal mood and affect. Her behavior is normal.  Nursing note and vitals reviewed.   Results for orders placed or performed  in visit on 01/14/18  Urine Culture  Result Value Ref Range   MICRO NUMBER: [id number]    SPECIMEN QUALITY: ADEQUATE    Sample Source URINE    STATUS: FINAL    ISOLATE 1: Escherichia coli (A)       Susceptibility   Escherichia coli - URINE CULTURE, REFLEX    AMOX/CLAVULANIC 4 Sensitive     AMPICILLIN 8 Sensitive     AMPICILLIN/SULBACTAM 4 Sensitive     CEFAZOLIN* <=4 Not Reportable      * For infections other than uncomplicated UTIcaused by E. coli, K. pneumoniae or P. mirabilis:Cefazolin is resistant if MIC > or = 8 mcg/mL.(Distinguishing susceptible versus intermediatefor isolates with MIC < or = 4 mcg/mL requiresadditional testing.)For uncomplicated UTI caused by E. coli,K. pneumoniae or P. mirabilis: Cefazolin issusceptible if MIC <32 mcg/mL and predictssusceptible to the oral agents cefaclor, cefdinir,cefpodoxime, cefprozil, cefuroxime, cephalexinand loracarbef.    CEFEPIME <=1 Sensitive     CEFTRIAXONE <=1 Sensitive     CIPROFLOXACIN >=4 Resistant      LEVOFLOXACIN >=8 Resistant     ERTAPENEM <=0.5 Sensitive     GENTAMICIN <=1 Sensitive     IMIPENEM <=0.25 Sensitive     NITROFURANTOIN 32 Sensitive     PIP/TAZO <=4 Sensitive     TOBRAMYCIN <=1 Sensitive     TRIMETH/SULFA* <=20 Sensitive      * For infections other than uncomplicated UTIcaused by E. coli, K. pneumoniae or P. mirabilis:Cefazolin is resistant if MIC > or = 8 mcg/mL.(Distinguishing susceptible versus intermediatefor isolates with MIC < or = 4 mcg/mL requiresadditional testing.)For uncomplicated UTI caused by E. coli,K. pneumoniae or P. mirabilis: Cefazolin issusceptible if MIC <32 mcg/mL and predictssusceptible to the oral agents cefaclor, cefdinir,cefpodoxime, cefprozil, cefuroxime, cephalexinand loracarbef.Legend:S = Susceptible  I = IntermediateR = Resistant  NS = Not susceptible* = Not tested  NR = Not reported**NN = See antimicrobic comments  POC Influenza A&B (Binax test)  Result Value Ref Range   Influenza A, POC Negative Negative   Influenza B, POC Negative Negative  POCT Urinalysis Dipstick (Automated)  Result Value Ref Range   Color, UA Yellow    Clarity, UA Hazy    Glucose, UA Negative    Bilirubin, UA Negative    Ketones, UA Negative    Spec Grav, UA 1.015 1.010 - 1.025   Blood, UA Small    pH, UA 7.0 5.0 - 8.0   Protein, UA Negative    Urobilinogen, UA 0.2 0.2 or 1.0 E.U./dL   Nitrite, UA Negative    Leukocytes, UA Moderate (2+) (A) Negative   Assessment and Plan:   1. Fever in other diseases - POC Influenza A&B (Binax test) - POCT Urinalysis Dipstick (Automated) - Urine Culture  2. Acute cystitis without hematuria - nitrofurantoin, macrocrystal-monohydrate, (MACROBID) 100 MG capsule; Take 1 capsule (100 mg total) by mouth 2 (two) times daily.  Dispense: 20 capsule; Refill: 0  3. Mixed hyperlipidemia - Comprehensive metabolic panel; Future - Lipid panel; Future  4. Idiopathic chronic venous hypertension of both lower extremities with ulcer and  inflammation (HCC) Stable.  5. Immunosuppressed status ([place]) Stable.  6. Rheumatoid arthritis involving multiple sites with positive rheumatoid factor (HCC) Pain is more severe lately. On limited Tramadol due to SSRI Ocie Cornfield). Patient would like to wean the Ocie Cornfield so that she may increase the Tramadol. States that it takes 2 hours to be able to move in the morning. See AVS for instructions to wean.    . Reviewed  expectations re: course of current medical issues. . Discussed self-management of symptoms. . Outlined signs and symptoms indicating need for more acute intervention. . Patient verbalized understanding and all questions were answered. Marland Kitchen Health Maintenance issues including appropriate healthy diet, exercise, and smoking avoidance were discussed with patient. . See orders for this visit as documented in the electronic medical record. . Patient received an After Visit Summary.  CMA served as Education administrator during this visit. History, Physical, and Plan performed by medical provider. The above documentation has been reviewed and is accurate and complete. Briscoe Deutscher, D.O.  Briscoe Deutscher, DO [place], [place] [facility] 01/17/2018

## 2018-01-16 ENCOUNTER — Encounter: Payer: Self-pay | Admitting: *Deleted

## 2018-01-16 NOTE — Progress Notes (Deleted)
Subjective:   Eileen Wilkerson is a 84 y.o. female who presents for Medicare Annual (Subsequent) preventive examination.  Reports health as   Diet   Exercise  Health Maintenance Due  Topic Date Due  . TETANUS/TDAP  09/07/1951  . INFLUENZA VACCINE  07/11/2017   Mammogram 12/2017 Dexa 12/2017  -1.7  Educated regarding shingrix       Objective:     Vitals: There were no vitals taken for this visit.  There is no height or weight on file to calculate BMI.  Advanced Directives 04/25/2017 12/28/2015  Does Patient Have a Medical Advance Directive? Yes Yes  Type of Paramedic of [place];Living will [place];Living will  Does patient want to make changes to medical advance directive? No - Patient declined No - Patient declined  Copy of [place] in Chart? No - copy requested Yes    Tobacco Social History   Tobacco Use  Smoking Status Former Smoker  . Packs/day: 0.75  . Years: 10.00  . Pack years: 7.50  . Types: Cigarettes  . Last attempt to quit: 1968  . Years since quitting: 51.1  Smokeless Tobacco Never Used     Counseling given: Not Answered   Clinical Intake:     Past Medical History:  Diagnosis Date  . Adrenal failure ([place])   . Arthritis   . Cataract   . Fibromyalgia 2008  . Osteoporosis   . RA (rheumatoid arthritis) ([place])    Past Surgical History:  Procedure Laterality Date  . BACK SURGERY    . BILATERAL CARPAL TUNNEL RELEASE    . CATARACT EXTRACTION, BILATERAL  2004  . CERVICAL FUSION  2011,2010,2008  . HEEL SPUR SURGERY  2004  . ROTATOR CUFF REPAIR    . TONSILLECTOMY AND ADENOIDECTOMY  1947  . TOTAL SHOULDER ARTHROPLASTY     Family History  Problem Relation Age of Onset  . Breast cancer Mother   . Diabetes Father    Social History   Socioeconomic History  . Marital status: Married    Spouse name: Not on file  . Number of children: Not on file  . Years of education: Not  on file  . Highest education level: Not on file  Social Needs  . Financial resource strain: Not on file  . Food insecurity - worry: Not on file  . Food insecurity - inability: Not on file  . Transportation needs - medical: Not on file  . Transportation needs - non-medical: Not on file  Occupational History  . Not on file  Tobacco Use  . Smoking status: Former Smoker    Packs/day: 0.75    Years: 10.00    Pack years: 7.50    Types: Cigarettes    Last attempt to quit: 1968    Years since quitting: 51.1  . Smokeless tobacco: Never Used  Substance and Sexual Activity  . Alcohol use: No  . Drug use: No  . Sexual activity: Not on file  Other Topics Concern  . Not on file  Social History Narrative  . Not on file    Outpatient Encounter Medications as of 01/17/2018  Medication Sig  . acetaminophen (TYLENOL) 650 MG CR tablet Take 500 mg by mouth 3 (three) times daily.   Marland Kitchen atorvastatin (LIPITOR) 20 MG tablet TAKE 1 TABLET BY MOUTH ONCE DAILY AT BEDTIME AS NEEDED  . Biotin (BIOTIN MAXIMUM STRENGTH) 10 MG TABS Take 10 mg by mouth daily.  . Cholecalciferol (  VITAMIN D3) 5000 units CAPS Take 5,000 Units by mouth daily.  Marland Kitchen CRANBERRY PO Take by mouth daily.  Marland Kitchen esomeprazole (NEXIUM) 20 MG packet Take 20 mg by mouth 2 (two) times daily.  . fluticasone (FLONASE) 50 MCG/ACT nasal spray Place 2 sprays into both nostrils daily.  . folic acid (FOLVITE) 1 MG tablet Take 1 mg by mouth daily.  . furosemide (LASIX) 80 MG tablet Take 1 tablet (80 mg total) by mouth daily.  . hydroxychloroquine (PLAQUENIL) 200 MG tablet TAKE 1 TABLET BY MOUTH ONCE DAILY  . hydroxychloroquine (PLAQUENIL) 200 MG tablet TAKE 1 TABLET BY MOUTH ONCE DAILY  . ketoconazole (NIZORAL) 2 % cream Apply 1 application topically daily.  . methocarbamol (ROBAXIN) 500 MG tablet TAKE 1 TABLET BY MOUTH EVERY 6 TO 8 HOURS AS NEEDED FOR PAIN  . nitrofurantoin, macrocrystal-monohydrate, (MACROBID) 100 MG capsule Take 1 capsule (100 mg total)  by mouth 2 (two) times daily.  . OTREXUP 15 MG/0.4ML SOAJ INJECT ONE PEN SUBCUTANEOUSLY ONCE EVERY WEEK. STORE AT ROOM TEMPERATURE BETWEEN 68 - 66 DEGREES F.  . potassium chloride SA (K-DUR,KLOR-CON) 20 MEQ tablet Take 1 tablet (20 mEq total) by mouth daily.  . predniSONE (DELTASONE) 1 MG tablet TAKE TWO TABLETS BY MOUTH ONCE DAILY  . predniSONE (DELTASONE) 5 MG tablet TAKE 1 TABLET BY MOUTH ONCE DAILY WITH BREAKFAST  . PREVACID 24HR 15 MG capsule Take 15 mg by mouth Daily.  Marland Kitchen SAVELLA 50 MG TABS tablet TAKE 1 TABLET BY MOUTH TWICE DAILY  . traMADol (ULTRAM) 50 MG tablet TAKE 1 TABLET BY MOUTH TWICE DAILY AS NEEDED  . Zoledronic Acid (RECLAST IV) Inject into the vein.   No facility-administered encounter medications on file as of 01/17/2018.     Activities of Daily Living In your present state of health, do you have any difficulty performing the following activities: 04/25/2017  Hearing? Y  Comment wears hearing aids  Vision? N  Difficulty concentrating or making decisions? N  Walking or climbing stairs? Y  Dressing or bathing? N  Doing errands, shopping? N  Some recent data might be hidden    Patient Care Team: Briscoe Deutscher, DO as PCP - General (Family Medicine)    Assessment:   This is a routine wellness examination for Eileen Wilkerson.  Exercise Activities and Dietary recommendations    Goals    None      Fall Risk Fall Risk  05/08/2017  Falls in the past year? No     Depression Screen PHQ 2/9 Scores 05/08/2017  PHQ - 2 Score 0     Cognitive Function     Ad8 score reviewed for issues:  Issues making decisions:  Less interest in hobbies / activities:  Repeats questions, stories (family complaining):  Trouble using ordinary gadgets (microwave, computer, phone):  Forgets the month or year:   Mismanaging finances:   Remembering appts:  Daily problems with thinking and/or memory: Ad8 score is=        Immunization History  Administered Date(s)  Administered  . Influenza,inj,Quad PF,6+ Mos 08/11/2016  . Pneumococcal Conjugate-13 06/26/2017     Screening Tests Health Maintenance  Topic Date Due  . TETANUS/TDAP  09/07/1951  . INFLUENZA VACCINE  07/11/2017  . PNA vac Low Risk Adult (2 of 2 - PPSV23) 06/26/2018  . DEXA SCAN  Completed         Plan:      PCP Notes ***  Health Maintenance ***  Abnormal Screens  ***  Referrals  ***  Patient concerns; ***  Nurse Concerns; ***  Next PCP apt ***      I have personally reviewed and noted the following in the patient's chart:   . Medical and social history . Use of alcohol, tobacco or illicit drugs  . Current medications and supplements . Functional ability and status . Nutritional status . Physical activity . Advanced directives . List of other physicians . Hospitalizations, surgeries, and ER visits in previous 12 months . Vitals . Screenings to include cognitive, depression, and falls . Referrals and appointments  In addition, I have reviewed and discussed with patient certain preventive protocols, quality metrics, and best practice recommendations. A written personalized care plan for preventive services as well as general preventive health recommendations were provided to patient.     Wynetta Fines, RN  01/16/2018

## 2018-01-17 ENCOUNTER — Ambulatory Visit: Payer: Self-pay | Admitting: *Deleted

## 2018-01-17 ENCOUNTER — Telehealth: Payer: Self-pay

## 2018-01-17 ENCOUNTER — Ambulatory Visit: Payer: Medicare Other | Admitting: *Deleted

## 2018-01-17 ENCOUNTER — Emergency Department (HOSPITAL_COMMUNITY)
Admission: EM | Admit: 2018-01-17 | Discharge: 2018-01-17 | Discharge status: Against Medical Advice | Payer: Medicare Other

## 2018-01-17 LAB — URINE CULTURE
MICRO NUMBER:: 90147437
SPECIMEN QUALITY:: ADEQUATE

## 2018-01-17 NOTE — Telephone Encounter (Signed)
Please be advised.  °

## 2018-01-17 NOTE — Telephone Encounter (Signed)
Left message to return call to our office.  

## 2018-01-17 NOTE — ED Notes (Signed)
Pt called from lobby to triage with no response.

## 2018-01-17 NOTE — ED Notes (Signed)
Called from lobby with no response  

## 2018-01-17 NOTE — Telephone Encounter (Signed)
My other message was clear. This patient needs to be seen and in a setting that would allow her to receive fluids if needed. I cannot see her Friday due to being overbooked. She is at risk for urosepsis. I WILL NOT call in an antiemetic. TO ER OR URGENT CARE.

## 2018-01-17 NOTE — ED Notes (Signed)
Patient called for triage  X 2 with no response.

## 2018-01-17 NOTE — Telephone Encounter (Signed)
Spoke with patient's daughter.  She states patient's fever has come down with the assistance of Tylenol.  She is feeling better.  She has been drinking "some" but has not eaten anything yet.  Did not go to UC or ED per message below.  Daughter asking that antibiotic be changed.  They do not think it is working.  They believe it is causing the patient to vomit.  Patient did not take antibiotic today, but still had a lot of vomiting this morning, per daughter.  Explained that vomiting may not be caused by antibiotic, especially if patient did not take antibiotic this morning and continued to have vomiting.  Advised that patient should still seek care at [facility] or ED.  Daughter verbalized understanding but states patient is refusing to go.  Advised to continue to push fluids and will have Juleen China team contact patient in morning when they return.  Advised that patient go to ED or call EMS immediately if her condition begins to worsen at all.  Daughter verbalized understanding.

## 2018-01-17 NOTE — Telephone Encounter (Signed)
I have spoke with patient this am and gave Dr. Juleen China instructions and she agreed to go to ED for evaluation as soon as she could get ride. Daughter called to let me know that as soon as mom called she went to her home. Patient did not remember calling her. She was very lethargic and she was not able to get her in the car so she called EMS to get her. States that the EMS basically talked mom out of going to ED telling her that she would just have to wait for 14 hours or more to be seen. They helped patient get in the car and suggested that they go to walk in. She took her mom to urgent care and went in to see if someone could help her out of the car. She was informed that if she was not able to make it in she would need to go to ED for treatment. She then took mom to [facility] ED and asked to get help getting mom out of car. Was told that some one would be out to help her. They waited for a while and no one ever came out to help. Mom refused to wait any longer and made her go home.   Patient is still having same symptoms. Fever is now 101.0. Informed daughter that she needs to go to ED. She states that she does not think she will be able to get in the car but she will call EMS and make them take her this time.

## 2018-01-17 NOTE — Telephone Encounter (Signed)
Second message on patient.

## 2018-01-17 NOTE — Telephone Encounter (Signed)
Please advise 

## 2018-01-17 NOTE — Telephone Encounter (Signed)
Pt;s daughter called to say that she felt like her mom was getting worse after being diagnosed with a UTI on Monday. She stated that she was having some nausea and vomiting and fatigue. She was having some difficulty walking. She called the EMTs to assist her.  They helped her to the car gave her Zofran for her vomiting.  Unable to get her into UC and ED, she was taken home by her daughter.  She stated her temp was 100.5 and 101 today.  Her daughter is requesting an anti emetic called in for her.  [place] notified.  Reason for Disposition . [1] Vomiting 2 or more times AND [2] interferes with taking oral antibiotic  Answer Assessment - Initial Assessment Questions 1. ANTIBIOTIC: "What antibiotic are you taking?" "How many times per day?"     macrobid twice a day 2. DURATION: "When was the antibiotic started?"      Started on Monday 3. MAIN SYMPTOM: "What is the main symptom you are concerned about?"     vomiting 4. FEVER: "Do you have a fever?" If so, ask: "What is it, how was it measured, and when did it start?"     Yes 100.5 and 101 5. OTHER SYMPTOMS: "Do you have any other symptoms?" (e.g., flank pain, vaginal discharge, blood in urine)     no  Protocols used: URINARY TRACT INFECTION ON ANTIBIOTIC FOLLOW-UP CALL - FEMALE-A-AH

## 2018-01-17 NOTE — Telephone Encounter (Signed)
Daughter returning call, call back [phone]

## 2018-01-18 ENCOUNTER — Telehealth: Payer: Self-pay | Admitting: Family Medicine

## 2018-01-18 ENCOUNTER — Encounter (HOSPITAL_COMMUNITY): Payer: Self-pay | Admitting: Emergency Medicine

## 2018-01-18 ENCOUNTER — Inpatient Hospital Stay (HOSPITAL_COMMUNITY)
Admission: EM | Admit: 2018-01-18 | Discharge: 2018-01-21 | DRG: 871 | Disposition: A | Discharge status: Home Health | Payer: Medicare Other | Attending: Internal Medicine | Admitting: Internal Medicine

## 2018-01-18 ENCOUNTER — Emergency Department (HOSPITAL_COMMUNITY): Payer: Medicare Other

## 2018-01-18 DIAGNOSIS — K219 Gastro-esophageal reflux disease without esophagitis: Secondary | ICD-10-CM | POA: Diagnosis present

## 2018-01-18 DIAGNOSIS — Z91013 Allergy to seafood: Secondary | ICD-10-CM

## 2018-01-18 DIAGNOSIS — M797 Fibromyalgia: Secondary | ICD-10-CM | POA: Diagnosis present

## 2018-01-18 DIAGNOSIS — Z882 Allergy status to sulfonamides status: Secondary | ICD-10-CM

## 2018-01-18 DIAGNOSIS — N390 Urinary tract infection, site not specified: Secondary | ICD-10-CM | POA: Diagnosis present

## 2018-01-18 DIAGNOSIS — E876 Hypokalemia: Secondary | ICD-10-CM | POA: Diagnosis present

## 2018-01-18 DIAGNOSIS — R531 Weakness: Secondary | ICD-10-CM | POA: Diagnosis present

## 2018-01-18 DIAGNOSIS — M810 Age-related osteoporosis without current pathological fracture: Secondary | ICD-10-CM | POA: Diagnosis present

## 2018-01-18 DIAGNOSIS — A419 Sepsis, unspecified organism: Principal | ICD-10-CM | POA: Diagnosis present

## 2018-01-18 DIAGNOSIS — Z87891 Personal history of nicotine dependence: Secondary | ICD-10-CM

## 2018-01-18 DIAGNOSIS — D849 Immunodeficiency, unspecified: Secondary | ICD-10-CM | POA: Diagnosis present

## 2018-01-18 DIAGNOSIS — Z888 Allergy status to other drugs, medicaments and biological substances status: Secondary | ICD-10-CM

## 2018-01-18 DIAGNOSIS — J9601 Acute respiratory failure with hypoxia: Secondary | ICD-10-CM | POA: Diagnosis present

## 2018-01-18 DIAGNOSIS — Z79899 Other long term (current) drug therapy: Secondary | ICD-10-CM

## 2018-01-18 DIAGNOSIS — I5032 Chronic diastolic (congestive) heart failure: Secondary | ICD-10-CM

## 2018-01-18 DIAGNOSIS — B9620 Unspecified Escherichia coli [E. coli] as the cause of diseases classified elsewhere: Secondary | ICD-10-CM | POA: Diagnosis present

## 2018-01-18 DIAGNOSIS — R0902 Hypoxemia: Secondary | ICD-10-CM

## 2018-01-18 DIAGNOSIS — M069 Rheumatoid arthritis, unspecified: Secondary | ICD-10-CM | POA: Diagnosis present

## 2018-01-18 DIAGNOSIS — M0579 Rheumatoid arthritis with rheumatoid factor of multiple sites without organ or systems involvement: Secondary | ICD-10-CM

## 2018-01-18 DIAGNOSIS — R112 Nausea with vomiting, unspecified: Secondary | ICD-10-CM | POA: Diagnosis present

## 2018-01-18 DIAGNOSIS — Z880 Allergy status to penicillin: Secondary | ICD-10-CM

## 2018-01-18 DIAGNOSIS — Z87440 Personal history of urinary (tract) infections: Secondary | ICD-10-CM | POA: Diagnosis not present

## 2018-01-18 DIAGNOSIS — R5381 Other malaise: Secondary | ICD-10-CM | POA: Diagnosis not present

## 2018-01-18 DIAGNOSIS — D899 Disorder involving the immune mechanism, unspecified: Secondary | ICD-10-CM

## 2018-01-18 DIAGNOSIS — R3129 Other microscopic hematuria: Secondary | ICD-10-CM | POA: Diagnosis present

## 2018-01-18 DIAGNOSIS — E785 Hyperlipidemia, unspecified: Secondary | ICD-10-CM | POA: Diagnosis present

## 2018-01-18 DIAGNOSIS — Z7952 Long term (current) use of systemic steroids: Secondary | ICD-10-CM

## 2018-01-18 DIAGNOSIS — Z91048 Other nonmedicinal substance allergy status: Secondary | ICD-10-CM

## 2018-01-18 DIAGNOSIS — J9811 Atelectasis: Secondary | ICD-10-CM | POA: Diagnosis present

## 2018-01-18 HISTORY — DX: Sepsis, unspecified organism: A41.9

## 2018-01-18 HISTORY — DX: Sepsis, unspecified organism: N39.0

## 2018-01-18 LAB — URINALYSIS, ROUTINE W REFLEX MICROSCOPIC
Bilirubin Urine: NEGATIVE
Glucose, UA: NEGATIVE mg/dL
Ketones, ur: NEGATIVE mg/dL
Nitrite: NEGATIVE
Protein, ur: NEGATIVE mg/dL
Specific Gravity, Urine: 1.014 (ref 1.005–1.030)
pH: 6 (ref 5.0–8.0)

## 2018-01-18 LAB — BASIC METABOLIC PANEL
Anion gap: 13 (ref 5–15)
BUN: 21 mg/dL — ABNORMAL HIGH (ref 6–20)
CO2: 29 mmol/L (ref 22–32)
Calcium: 10.2 mg/dL (ref 8.9–10.3)
Chloride: 96 mmol/L — ABNORMAL LOW (ref 101–111)
Creatinine, Ser: 1.09 mg/dL — ABNORMAL HIGH (ref 0.44–1.00)
GFR calc Af Amer: 53 mL/min — ABNORMAL LOW (ref >60)
GFR calc non Af Amer: 46 mL/min — ABNORMAL LOW (ref >60)
Glucose, Bld: 110 mg/dL — ABNORMAL HIGH (ref 65–99)
Potassium: 2.8 mmol/L — ABNORMAL LOW (ref 3.5–5.1)
Sodium: 138 mmol/L (ref 135–145)

## 2018-01-18 LAB — CBC
HCT: 39.2 % (ref 36.0–46.0)
Hemoglobin: 13.1 g/dL (ref 12.0–15.0)
MCH: 33 pg (ref 26.0–34.0)
MCHC: 33.4 g/dL (ref 30.0–36.0)
MCV: 98.7 fL (ref 78.0–100.0)
Platelets: 282 10*3/uL (ref 150–400)
RBC: 3.97 MIL/uL (ref 3.87–5.11)
RDW: 15.7 % — ABNORMAL HIGH (ref 11.5–15.5)
WBC: 10.2 10*3/uL (ref 4.0–10.5)

## 2018-01-18 LAB — I-STAT CG4 LACTIC ACID, ED: Lactic Acid, Venous: 1.64 mmol/L (ref 0.5–1.9)

## 2018-01-18 MED ORDER — METOPROLOL TARTRATE 5 MG/5ML IV SOLN
5.0000 mg | Freq: Once | INTRAVENOUS | Status: AC
  Administered 2018-01-18: 5 mg via INTRAVENOUS
  Filled 2018-01-18: qty 5

## 2018-01-18 MED ORDER — VANCOMYCIN HCL IN DEXTROSE 1-5 GM/200ML-% IV SOLN
1000.0000 mg | Freq: Once | INTRAVENOUS | Status: AC
  Administered 2018-01-18: 1000 mg via INTRAVENOUS
  Filled 2018-01-18: qty 200

## 2018-01-18 MED ORDER — SODIUM CHLORIDE 0.9 % IV BOLUS (SEPSIS)
1000.0000 mL | Freq: Once | INTRAVENOUS | Status: AC
  Administered 2018-01-18: 1000 mL via INTRAVENOUS

## 2018-01-18 MED ORDER — FLUTICASONE PROPIONATE 50 MCG/ACT NA SUSP
2.0000 | Freq: Every day | NASAL | Status: DC
  Administered 2018-01-19 – 2018-01-21 (×3): 2 via NASAL
  Filled 2018-01-18: qty 16

## 2018-01-18 MED ORDER — ACETAMINOPHEN 325 MG PO TABS
650.0000 mg | ORAL_TABLET | Freq: Once | ORAL | Status: AC
  Administered 2018-01-18: 650 mg via ORAL
  Filled 2018-01-18: qty 2

## 2018-01-18 MED ORDER — PREDNISONE 1 MG PO TABS
7.0000 mg | ORAL_TABLET | Freq: Every day | ORAL | Status: DC
  Administered 2018-01-19 – 2018-01-21 (×3): 7 mg via ORAL
  Filled 2018-01-18 (×3): qty 2

## 2018-01-18 MED ORDER — FUROSEMIDE 40 MG PO TABS
80.0000 mg | ORAL_TABLET | Freq: Every day | ORAL | Status: DC
  Administered 2018-01-19 – 2018-01-21 (×3): 80 mg via ORAL
  Filled 2018-01-18 (×3): qty 2

## 2018-01-18 MED ORDER — SODIUM CHLORIDE 0.9 % IV SOLN
1000.0000 mL | INTRAVENOUS | Status: DC
  Administered 2018-01-18: 1000 mL via INTRAVENOUS

## 2018-01-18 MED ORDER — SODIUM CHLORIDE 0.9% FLUSH: 3.0000 mL | INTRAVENOUS | Status: DC | PRN

## 2018-01-18 MED ORDER — ACETAMINOPHEN 325 MG PO TABS
650.0000 mg | ORAL_TABLET | Freq: Four times a day (QID) | ORAL | Status: DC | PRN
  Administered 2018-01-18: 650 mg via ORAL
  Filled 2018-01-18: qty 2

## 2018-01-18 MED ORDER — ONDANSETRON HCL 4 MG/2ML IJ SOLN
4.0000 mg | Freq: Four times a day (QID) | INTRAMUSCULAR | Status: DC | PRN
  Administered 2018-01-19: 4 mg via INTRAVENOUS
  Filled 2018-01-18: qty 2

## 2018-01-18 MED ORDER — ATORVASTATIN CALCIUM 20 MG PO TABS
20.0000 mg | ORAL_TABLET | Freq: Every day | ORAL | Status: DC
  Administered 2018-01-19 – 2018-01-20 (×2): 20 mg via ORAL
  Filled 2018-01-18 (×3): qty 1

## 2018-01-18 MED ORDER — ONDANSETRON HCL 4 MG PO TABS
4.0000 mg | ORAL_TABLET | Freq: Four times a day (QID) | ORAL | Status: DC | PRN
Start: 2018-01-18 — End: 2018-01-21

## 2018-01-18 MED ORDER — POTASSIUM CHLORIDE CRYS ER 20 MEQ PO TBCR
40.0000 meq | EXTENDED_RELEASE_TABLET | Freq: Once | ORAL | Status: AC
  Administered 2018-01-18: 40 meq via ORAL
  Filled 2018-01-18: qty 2

## 2018-01-18 MED ORDER — ACETAMINOPHEN 650 MG RE SUPP: 650.0000 mg | Freq: Four times a day (QID) | RECTAL | Status: DC | PRN

## 2018-01-18 MED ORDER — SODIUM CHLORIDE 0.9% FLUSH
3.0000 mL | Freq: Two times a day (BID) | INTRAVENOUS | Status: DC
  Administered 2018-01-18 – 2018-01-21 (×4): 3 mL via INTRAVENOUS

## 2018-01-18 MED ORDER — SODIUM CHLORIDE 0.9 % IV SOLN: 250.0000 mL | INTRAVENOUS | Status: DC | PRN

## 2018-01-18 MED ORDER — SODIUM CHLORIDE 0.9 % IV BOLUS (SEPSIS)
500.0000 mL | Freq: Once | INTRAVENOUS | Status: AC
  Administered 2018-01-18: 500 mL via INTRAVENOUS

## 2018-01-18 MED ORDER — DEXTROSE 5 % IV SOLN
2.0000 g | Freq: Once | INTRAVENOUS | Status: AC
  Administered 2018-01-18: 2 g via INTRAVENOUS
  Filled 2018-01-18: qty 2

## 2018-01-18 MED ORDER — AZTREONAM IN DEXTROSE 2 GM/50ML IV SOLN
2.0000 g | Freq: Once | INTRAVENOUS | Status: AC
  Administered 2018-01-18: 2 g via INTRAVENOUS
  Filled 2018-01-18: qty 50

## 2018-01-18 MED ORDER — PANTOPRAZOLE SODIUM 40 MG PO TBEC: 40.0000 mg | DELAYED_RELEASE_TABLET | Freq: Two times a day (BID) | ORAL | Status: DC | PRN

## 2018-01-18 MED ORDER — TRAMADOL HCL 50 MG PO TABS
50.0000 mg | ORAL_TABLET | Freq: Two times a day (BID) | ORAL | Status: DC | PRN
  Administered 2018-01-18 – 2018-01-21 (×4): 50 mg via ORAL
  Filled 2018-01-18 (×4): qty 1

## 2018-01-18 MED ORDER — ENOXAPARIN SODIUM 40 MG/0.4ML ~~LOC~~ SOLN
40.0000 mg | SUBCUTANEOUS | Status: DC
Start: 2018-01-18 — End: 2018-01-21
  Administered 2018-01-18 – 2018-01-20 (×3): 40 mg via SUBCUTANEOUS
  Filled 2018-01-18 (×3): qty 0.4

## 2018-01-18 MED ORDER — DEXTROSE 5 % IV SOLN
2.0000 g | Freq: Once | INTRAVENOUS | Status: DC
  Filled 2018-01-18: qty 2

## 2018-01-18 MED ORDER — MILNACIPRAN HCL 50 MG PO TABS
50.0000 mg | ORAL_TABLET | Freq: Two times a day (BID) | ORAL | Status: DC
  Administered 2018-01-19 – 2018-01-21 (×5): 50 mg via ORAL
  Filled 2018-01-18 (×9): qty 1

## 2018-01-18 MED ORDER — DEXTROSE 5 % IV SOLN
1.0000 g | INTRAVENOUS | Status: DC
  Administered 2018-01-19 – 2018-01-20 (×2): 1 g via INTRAVENOUS
  Filled 2018-01-18 (×2): qty 1

## 2018-01-18 NOTE — ED Notes (Signed)
Bed: SW10 Expected date:  Expected time:  Means of arrival:  Comments: Transfer from [facility]

## 2018-01-18 NOTE — ED Notes (Signed)
Pt made aware a urine sample is needed. 

## 2018-01-18 NOTE — Telephone Encounter (Signed)
Please be advised.  °

## 2018-01-18 NOTE — ED Notes (Signed)
Bed: WTR5 Expected date:  Expected time:  Means of arrival:  Comments: 

## 2018-01-18 NOTE — Telephone Encounter (Signed)
Left voicemail on daughters cell phone to call office back as soon as possible.

## 2018-01-18 NOTE — ED Triage Notes (Signed)
Per EMS-states EMS got called for a fall-no injury transfering-states she is on antibiotics which she has stopped due to them making her nauseated

## 2018-01-18 NOTE — H&P (Signed)
History and Physical   Eileen Wilkerson:[id number] DOB: 11-07-1932 DOA: 01/18/2018  Referring MD/NP/PA: Eulis Foster, MD, EDP PCP: Briscoe Deutscher, DO  Patient coming from: Home  Chief Complaint: Fever and vomiting  HPI: Eileen Wilkerson is an 84 y.o. female with a history of RA on plaquenil and prednisone, recurrent UTI, chronic diastolic CHF, hyperlipidemia, fibromyalgia who presented to the ED with fever and vomiting with associated worsening weakness. She was found on the ground near the sofa today by her home health aide, stating she slipped trying to get up to let the aide in and was assisted up by the aide. She started with fever 5 days ago and saw her PCP the next day where flu swab was negative, urinalysis indicated UTI, started on macrobid. She started taking this but did not feel much better over the next 2 days. She subsequently developed nausea and vomiting, no po intake, and stopped taking macrobid. She continued to have fevers. Because of this, her PCP urged her to go to the ED or urgent care.   On arrival here she was febrile and tachycardic with documented hypoxia and tachypnea. Urinalysis shows significant pyuria, bacteriuria, and microscopic hematuria. WBC 10.3, lactic acid 1.64, creatinine near baseline at 1.09, potassium 2.8. CXR shows no infiltrate or pulmonary edema. Vanc and aztreonam, 2.5L NS were given, oxygen applied and hospitalists called for admission.    Review of Systems: No chest pain, and per HPI. All others reviewed and are negative.   Past Medical History:  Diagnosis Date  . Adrenal failure ([place])   . Arthritis   . Cataract   . Fibromyalgia 2008  . Osteoporosis   . RA (rheumatoid arthritis) ([place])    Past Surgical History:  Procedure Laterality Date  . BACK SURGERY    . BILATERAL CARPAL TUNNEL RELEASE    . CATARACT EXTRACTION, BILATERAL  2004  . CERVICAL FUSION  2011,2010,2008  . HEEL SPUR SURGERY  2004  . ROTATOR CUFF REPAIR    . TONSILLECTOMY AND  ADENOIDECTOMY  1947  . TOTAL SHOULDER ARTHROPLASTY     - Minimal remote smoking history, no EtOH or illicit drugs. Husband died in 10-11-2017, feels she's been deconditioned ever since.   reports that she quit smoking about 51 years ago. Her smoking use included cigarettes. She has a 7.50 pack-year smoking history. she has never used smokeless tobacco. She reports that she does not drink alcohol or use drugs. Allergies  Allergen Reactions  . Adhesive [Tape] Rash  . Celebrex [Celecoxib] Hives  . Ciprofibrate Nausea Only  . Cymbalta [Duloxetine Hcl] Swelling  . Gabitril [Tiagabine] Swelling  . Keflex [Cephalexin] Nausea And Vomiting  . Lyrica [Pregabalin] Swelling  . Neurontin [Gabapentin] Swelling  . Nexium [Esomeprazole] Rash  . Nsaids Rash  . Penicillins Rash    Injection site reaction. Tolerated cefepime in past Has patient had a PCN reaction causing immediate rash, facial/tongue/throat swelling, SOB or lightheadedness with hypotension: No Has patient had a PCN reaction causing severe rash involving mucus membranes or skin necrosis: No Has patient had a PCN reaction that required hospitalization No Has patient had a PCN reaction occurring within the last 10 years: No If all of the above answers are "NO", then may proceed with Cephalosporin use.   . Shrimp [Shellfish Allergy] Anaphylaxis    Per patient "shrimp only"  . Sulfa Antibiotics Nausea And Vomiting  . Ciprofloxacin Other (See Comments)    dizziness  . Claritin-D 12 Hour [Loratadine-Pseudoephedrine Er] Anxiety  Family History  Problem Relation Age of Onset  . Breast cancer Mother   . Diabetes Father    - Family history otherwise reviewed and not pertinent.  Prior to Admission medications   Medication Sig Start Date End Date Taking? Authorizing Provider  acetaminophen (TYLENOL) 650 MG CR tablet Take 500 mg by mouth 3 (three) times daily.    Yes [provider]  atorvastatin (LIPITOR) 20 MG tablet TAKE 1 TABLET  BY MOUTH ONCE DAILY AT BEDTIME AS NEEDED 01/08/18  Yes Briscoe Deutscher, DO  Biotin (BIOTIN MAXIMUM STRENGTH) 10 MG TABS Take 10 mg by mouth daily.   Yes [provider]  Cholecalciferol (VITAMIN D3) 5000 units CAPS Take 5,000 Units by mouth daily.   Yes [provider]  CRANBERRY PO Take by mouth daily.   Yes [provider]  esomeprazole (NEXIUM) 20 MG packet Take 20 mg by mouth 2 (two) times daily.   Yes [provider]  fluticasone (FLONASE) 50 MCG/ACT nasal spray Place 2 sprays into both nostrils daily. 12/26/17  Yes Briscoe Deutscher, DO  folic acid (FOLVITE) 1 MG tablet Take 1 mg by mouth daily.   Yes [provider]  furosemide (LASIX) 80 MG tablet Take 1 tablet (80 mg total) by mouth daily. 06/26/17  Yes Briscoe Deutscher, DO  hydroxychloroquine (PLAQUENIL) 200 MG tablet TAKE 1 TABLET BY MOUTH ONCE DAILY 08/14/17  Yes Deveshwar, Abel Presto, MD  nitrofurantoin, macrocrystal-monohydrate, (MACROBID) 100 MG capsule Take 1 capsule (100 mg total) by mouth 2 (two) times daily. 01/14/18  Yes Briscoe Deutscher, DO  OTREXUP 15 MG/0.4ML SOAJ INJECT ONE PEN SUBCUTANEOUSLY ONCE EVERY WEEK. STORE AT ROOM TEMPERATURE BETWEEN 68 - 18 DEGREES F. 01/03/18  Yes Deveshwar, Abel Presto, MD  predniSONE (DELTASONE) 1 MG tablet TAKE TWO TABLETS BY MOUTH ONCE DAILY 09/17/17  Yes Deveshwar, Abel Presto, MD  predniSONE (DELTASONE) 5 MG tablet TAKE 1 TABLET BY MOUTH ONCE DAILY WITH BREAKFAST 11/16/17  Yes Deveshwar, Abel Presto, MD  SAVELLA 50 MG TABS tablet TAKE 1 TABLET BY MOUTH TWICE DAILY 10/17/17  Yes Briscoe Deutscher, DO  traMADol (ULTRAM) 50 MG tablet TAKE 1 TABLET BY MOUTH TWICE DAILY AS NEEDED 12/18/17  Yes Deveshwar, Abel Presto, MD  hydroxychloroquine (PLAQUENIL) 200 MG tablet TAKE 1 TABLET BY MOUTH ONCE DAILY Patient not taking: Reported on 01/18/2018 08/17/17   Bo Merino, MD  ketoconazole (NIZORAL) 2 % cream Apply 1 application topically daily. Patient not taking: Reported on 01/18/2018 10/03/17   Briscoe Deutscher, DO  methocarbamol (ROBAXIN) 500 MG tablet TAKE 1 TABLET BY MOUTH EVERY 6 TO 8 HOURS AS NEEDED FOR PAIN Patient not taking: Reported on 01/18/2018 06/01/17   Bo Merino, MD  potassium chloride SA (K-DUR,KLOR-CON) 20 MEQ tablet Take 1 tablet (20 mEq total) by mouth daily. Patient not taking: Reported on 01/18/2018 06/26/17   Briscoe Deutscher, DO    Physical Exam: Vitals:   01/18/18 1530 01/18/18 1600 01/18/18 1630 01/18/18 1700  BP: 132/66 130/65 (!) 114/59 118/71  Pulse: 87 83 80 79  Resp: 19 18 18  (!) 21  Temp:      TempSrc:      SpO2: 93% 90% 93% 92%  Weight:      Height:       Constitutional: Elderly female in no distress, calm demeanor Eyes: Lids and conjunctivae normal, PERRL ENMT: Mucous membranes are moist. Posterior pharynx clear of any exudate or lesions. Fair dentition.  Neck: normal, supple, no masses, no thyromegaly Respiratory: Non-labored without accessory muscle use with supplemental  oxygen, 100% SpO2. Clear breath sounds to auscultation bilaterally Cardiovascular: Regular rate and rhythm, no murmurs, rubs, or gallops. No carotid bruits. No JVD. No LE edema. + pedal pulses. Abdomen: Normoactive bowel sounds. + suprapubic tenderness, no other tenderness including CVA, non-distended, and no masses palpated. No hepatosplenomegaly. GU: No indwelling catheter Musculoskeletal: No clubbing / cyanosis. No joint deformity upper and lower extremities. Good ROM, no contractures. Normal muscle tone. LE's with venous stasis dermatitis, varicosities. Skin: Warm, dry. No rashes, wounds, no ulcers.  Neurologic: CN II-XII grossly intact. Gait not assessed. Speech normal. No focal deficits in motor strength or sensation in all extremities.  Psychiatric: Alert and oriented x3. Normal judgment and insight. Mood anxious with broad affect.   Labs on Admission: I have personally reviewed following labs and imaging studies  CBC: Recent Labs  Lab 01/18/18 1358  WBC 10.2  HGB 13.1    HCT 39.2  MCV 98.7  PLT 627   Basic Metabolic Panel: Recent Labs  Lab 01/18/18 1358  NA 138  K 2.8*  CL 96*  CO2 29  GLUCOSE 110*  BUN 21*  CREATININE 1.09*  CALCIUM 10.2   GFR: Estimated Creatinine Clearance: 38.1 mL/min (A) (by C-G formula based on SCr of 1.09 mg/dL (H)). Liver Function Tests: No results for input(s): AST, ALT, ALKPHOS, BILITOT, PROT, ALBUMIN in the last 168 hours. No results for input(s): LIPASE, AMYLASE in the last 168 hours. No results for input(s): AMMONIA in the last 168 hours. Coagulation Profile: No results for input(s): INR, PROTIME in the last 168 hours. Cardiac Enzymes: No results for input(s): CKTOTAL, CKMB, CKMBINDEX, TROPONINI in the last 168 hours. BNP (last 3 results) No results for input(s): PROBNP in the last 8760 hours. HbA1C: No results for input(s): HGBA1C in the last 72 hours. CBG: No results for input(s): GLUCAP in the last 168 hours. Lipid Profile: No results for input(s): CHOL, HDL, LDLCALC, TRIG, CHOLHDL, LDLDIRECT in the last 72 hours. Thyroid Function Tests: No results for input(s): TSH, T4TOTAL, FREET4, T3FREE, THYROIDAB in the last 72 hours. Anemia Panel: No results for input(s): VITAMINB12, FOLATE, FERRITIN, TIBC, IRON, RETICCTPCT in the last 72 hours. Urine analysis:    Component Value Date/Time   COLORURINE YELLOW 01/18/2018 1530   APPEARANCEUR CLOUDY (A) 01/18/2018 1530   LABSPEC 1.014 01/18/2018 1530   PHURINE 6.0 01/18/2018 1530   GLUCOSEU NEGATIVE 01/18/2018 1530   HGBUR MODERATE (A) 01/18/2018 1530   BILIRUBINUR NEGATIVE 01/18/2018 1530   BILIRUBINUR Negative 01/14/2018 1454   KETONESUR NEGATIVE 01/18/2018 1530   PROTEINUR NEGATIVE 01/18/2018 1530   UROBILINOGEN 0.2 01/14/2018 1454   UROBILINOGEN 0.2 04/12/2010 1121   NITRITE NEGATIVE 01/18/2018 1530   LEUKOCYTESUR LARGE (A) 01/18/2018 1530    Recent Results (from the past 240 hour(s))  Urine Culture     Status: Abnormal   Collection Time: 01/14/18   2:43 PM  Result Value Ref Range Status   MICRO NUMBER: [id number]  Final   SPECIMEN QUALITY: ADEQUATE  Final   Sample Source URINE  Final   STATUS: FINAL  Final   ISOLATE 1: Escherichia coli (A)  Final      Susceptibility   Escherichia coli - URINE CULTURE, REFLEX    AMOX/CLAVULANIC 4 Sensitive     AMPICILLIN 8 Sensitive     AMPICILLIN/SULBACTAM 4 Sensitive     CEFAZOLIN* <=4 Not Reportable      * For infections other than uncomplicated UTIcaused by E. coli, K. pneumoniae or P. mirabilis:Cefazolin is resistant if  MIC > or = 8 mcg/mL.(Distinguishing susceptible versus intermediatefor isolates with MIC < or = 4 mcg/mL requiresadditional testing.)For uncomplicated UTI caused by E. coli,K. pneumoniae or P. mirabilis: Cefazolin issusceptible if MIC <32 mcg/mL and predictssusceptible to the oral agents cefaclor, cefdinir,cefpodoxime, cefprozil, cefuroxime, cephalexinand loracarbef.    CEFEPIME <=1 Sensitive     CEFTRIAXONE <=1 Sensitive     CIPROFLOXACIN >=4 Resistant     LEVOFLOXACIN >=8 Resistant     ERTAPENEM <=0.5 Sensitive     GENTAMICIN <=1 Sensitive     IMIPENEM <=0.25 Sensitive     NITROFURANTOIN 32 Sensitive     PIP/TAZO <=4 Sensitive     TOBRAMYCIN <=1 Sensitive     TRIMETH/SULFA* <=20 Sensitive      * For infections other than uncomplicated UTIcaused by E. coli, K. pneumoniae or P. mirabilis:Cefazolin is resistant if MIC > or = 8 mcg/mL.(Distinguishing susceptible versus intermediatefor isolates with MIC < or = 4 mcg/mL requiresadditional testing.)For uncomplicated UTI caused by E. coli,K. pneumoniae or P. mirabilis: Cefazolin issusceptible if MIC <32 mcg/mL and predictssusceptible to the oral agents cefaclor, cefdinir,cefpodoxime, cefprozil, cefuroxime, cephalexinand loracarbef.Legend:S = Susceptible  I = IntermediateR = Resistant  NS = Not susceptible* = Not tested  NR = Not reported**NN = See antimicrobic comments     Radiological Exams on Admission: Dg Chest 2 View  Result Date:  01/18/2018 CLINICAL DATA:  Shortness of breath, productive cough. EXAM: CHEST  2 VIEW COMPARISON:  Radiographs of July 11, 2017. FINDINGS: The heart size and mediastinal contours are within normal limits. No pneumothorax or pleural effusion is noted. Right lung is clear. Elevated left hemidiaphragm is noted with mild left basilar subsegmental atelectasis or scarring. The visualized skeletal structures are unremarkable. IMPRESSION: Elevated left hemidiaphragm with mild left basilar subsegmental atelectasis or scarring. Electronically Signed   By: Marijo Conception, M.D.   On: 01/18/2018 11:50    EKG: Independently reviewed. NSR, QTc 470msec. Mild diminution of T waves diffusely, suggestive of LVH with repolarization abnormality identical to prior tracings.   Assessment/Plan Active Problems:   * No active hospital problems. *   Acute hypoxia: No respiratory distress, no significant findings on exam or CXR. Likely due to sepsis. Not currently appearing volume overloaded.  - Repeat CXR in AM. Could have UTI and aspiration pneumonia with vomiting, though suspicion for this is low at admission. - Continue supplemental oxygen as needed - Was given broad abx to cover pneumonia  Sepsis due to E. coli UTI: Urine culture from PCP visit grew E. coli resistant only to fluoroquinolones. Suspect either inadequate treatment due to insufficient intake of medication due to vomiting or upper tract disease not treated by macrobid. Treatment failure more likely due to age and immunosuppressed status. - Narrow to cefepime based on past tolerance (multiple drug allergies) and urine cultures data - Monitor blood and urine cultures - DC IVF's and encourage po intake w/antiemetic support, soft diet.   Chronic diastolic CHF: Echo June 3329 showed EF 55-60%, G1DD, normal RV, no WMA's.  - Plan to restart lasix 2/9 s/p IVF's for sepsis  - Weights, I/O  Hypokalemia: Due to poor per oral intake.  - Replace and recheck in  AM  Deconditioning/weakness: Found on floor without injury. Has been getting around less since husband passed in Oct 2018. Lives alone with aides.  - PT eval start tmrw  Fibromyalgia: Stable - Continue savella, tramadol prn  Rheumatoid arthritis:  - Will hold plaquenil while hospitalized with sepsis - Continue prednisone.  BP appears stable. Will hold on stress steroids for now.  DVT prophylaxis: Lovenox  Code Status: Full  Family Communication: Daughter Disposition Plan: Uncertain Consults called: None  Admission status: Inpatient    Vance Gather, MD [facility] Pager [phone]  If 7PM-7AM, please contact night-coverage [email or web address] Password [facility] 01/18/2018, 6:04 PM

## 2018-01-18 NOTE — ED Notes (Signed)
Attempted to call report for second time 

## 2018-01-18 NOTE — Telephone Encounter (Signed)
fyi

## 2018-01-18 NOTE — ED Provider Notes (Signed)
The patient was initially evaluated by Dr. Zenia Resides and he asked me to see her and evaluate after return of urinary testing.  Urinalysis is consistent with urinary tract infection.  Patient also has significant hypokalemia, 2.8.  She was initially empirically treated for presumed respiratory infection/pneumonia.  Initial imaging, chest x-ray did not indicate pneumonia.  Patient has multiple allergies.  BUN  Date Value Ref Range Status  01/18/2018 21 (H) 6 - 20 mg/dL Final  11/13/2017 20 7 - 25 mg/dL Final  07/16/2017 17 7 - 25 mg/dL Final  05/17/2017 17 6 - 23 mg/dL Final      Patient Vitals for the past 24 hrs:  BP Temp Temp src Pulse Resp SpO2 Height Weight  01/18/18 1530 132/66 - - 87 19 93 % - -  01/18/18 1511 - - - - - - 5' 3.25" (1.607 m) 74.8 kg (165 lb)  01/18/18 1435 - (!) 100.8 F (38.2 C) Rectal - - - - -  01/18/18 1432 - 99.3 F (37.4 C) Oral - - - - -  01/18/18 1430 137/70 - - 96 18 93 % - -  01/18/18 1428 132/78 - - 99 (!) 29 90 % - -  01/18/18 0934 139/77 99.8 F (37.7 C) Oral (!) 109 16 94 % - -    4:58 PM Reevaluation with update and discussion. After initial assessment and treatment, an updated evaluation reveals currently, the patient is complaining of lower abdominal pain.  The abdomen is nontender to palpation.  She has had a mild cough, and pneumonia several times in the last couple of years.  She does not currently take respiratory treatments. Daleen Bo   MDM-acute infectious process, likely urinary tract, with malaise, vomiting and weakness.  Hypoxia is nonspecific and will require management and likely repeat imaging.  Consider pulmonary consultation.  Oxygen improved with nasal cannula. she will require treatment, for stabilization in the hospital, and is appropriate for observation status.  5:08 PM-Consult complete with hospitalist. Patient case explained and discussed.  He agrees to admit patient for further evaluation and treatment. Call ended at 60:  42     Daleen Bo, MD 01/18/18 1743

## 2018-01-18 NOTE — ED Provider Notes (Signed)
[facility] DEPT Provider Note   CSN: [id number] Arrival date & time: 01/18/18  8786     History   Chief Complaint Chief Complaint  Patient presents with  . Recurrent UTI    HPI Eileen Wilkerson is a 84 y.o. female.  84 year old female presents with 4 days of weakness.  Symptoms began on Monday and was diagnosed with UTI and was placed on Macrobid.  She also had a negative influenza test in the office 4 days ago.  Since that time she has had temperature at home up to 102.  She has had some nonproductive cough without diarrhea but has had some emesis.  Denies any abdominal chest pain.  No neck pain or photophobia.  Mild frontal headache noted.  Does have a history of sepsis in the past.  Called EMS 2 days ago for similar symptoms and was convinced to stay home.  Today called her doctor and was told to come here      Past Medical History:  Diagnosis Date  . Adrenal failure ([place])   . Arthritis   . Cataract   . Fibromyalgia 2008  . Osteoporosis   . RA (rheumatoid arthritis) [facility])     Patient Active Problem List   Diagnosis Date Noted  . Idiopathic chronic venous hypertension of both lower extremities with ulcer and inflammation ([place]) 09/18/2017  . Closed nondisplaced fracture of fifth right metatarsal bone 09/18/2017  . Bilateral lower extremity edema 06/30/2017  . Physical deconditioning 05/30/2017  . Immunosuppressed status ([place]) 05/30/2017  . Abnormal chest x-ray 05/17/2017  . PNA (pneumonia) 04/25/2017  . Fibromyalgia 12/18/2016  . DJD (degenerative joint disease), cervical 12/18/2016  . Spondylosis of lumbar region without myelopathy or radiculopathy 12/18/2016  . Trigger finger, right middle finger 12/18/2016  . Primary osteoarthritis of both hands 12/18/2016  . Primary osteoarthritis of both feet 12/18/2016  . Primary osteoarthritis of both knees 12/18/2016  . GERD (gastroesophageal reflux disease) 12/18/2016  . Age-related  osteoporosis without current pathological fracture 12/18/2016  . High risk medication use 12/18/2016  . Rheumatoid arthritis ([place]) 12/28/2015  . Long term current use of systemic steroids 12/28/2015  . Lymphocytosis 11/13/2012    Past Surgical History:  Procedure Laterality Date  . BACK SURGERY    . BILATERAL CARPAL TUNNEL RELEASE    . CATARACT EXTRACTION, BILATERAL  2004  . CERVICAL FUSION  2011,2010,2008  . HEEL SPUR SURGERY  2004  . ROTATOR CUFF REPAIR    . TONSILLECTOMY AND ADENOIDECTOMY  1947  . TOTAL SHOULDER ARTHROPLASTY      OB History    No data available       Home Medications    Prior to Admission medications   Medication Sig Start Date End Date Taking? Authorizing Provider  acetaminophen (TYLENOL) 650 MG CR tablet Take 500 mg by mouth 3 (three) times daily.     [provider]  atorvastatin (LIPITOR) 20 MG tablet TAKE 1 TABLET BY MOUTH ONCE DAILY AT BEDTIME AS NEEDED 01/08/18   Briscoe Deutscher, DO  Biotin (BIOTIN MAXIMUM STRENGTH) 10 MG TABS Take 10 mg by mouth daily.    [provider]  Cholecalciferol (VITAMIN D3) 5000 units CAPS Take 5,000 Units by mouth daily.    [provider]  CRANBERRY PO Take by mouth daily.    [provider]  esomeprazole (NEXIUM) 20 MG packet Take 20 mg by mouth 2 (two) times daily.    [provider]  fluticasone (FLONASE) 50 MCG/ACT  nasal spray Place 2 sprays into both nostrils daily. 12/26/17   Briscoe Deutscher, DO  folic acid (FOLVITE) 1 MG tablet Take 1 mg by mouth daily.    [provider]  furosemide (LASIX) 80 MG tablet Take 1 tablet (80 mg total) by mouth daily. 06/26/17   Briscoe Deutscher, DO  hydroxychloroquine (PLAQUENIL) 200 MG tablet TAKE 1 TABLET BY MOUTH ONCE DAILY 08/14/17   Bo Merino, MD  hydroxychloroquine (PLAQUENIL) 200 MG tablet TAKE 1 TABLET BY MOUTH ONCE DAILY 08/17/17   Bo Merino, MD  ketoconazole (NIZORAL) 2 % cream Apply 1 application topically daily.  10/03/17   Briscoe Deutscher, DO  methocarbamol (ROBAXIN) 500 MG tablet TAKE 1 TABLET BY MOUTH EVERY 6 TO 8 HOURS AS NEEDED FOR PAIN 06/01/17   Bo Merino, MD  nitrofurantoin, macrocrystal-monohydrate, (MACROBID) 100 MG capsule Take 1 capsule (100 mg total) by mouth 2 (two) times daily. 01/14/18   Briscoe Deutscher, DO  OTREXUP 15 MG/0.4ML SOAJ INJECT ONE PEN SUBCUTANEOUSLY ONCE EVERY WEEK. STORE AT ROOM TEMPERATURE BETWEEN 68 - 51 DEGREES F. 01/03/18   Bo Merino, MD  potassium chloride SA (K-DUR,KLOR-CON) 20 MEQ tablet Take 1 tablet (20 mEq total) by mouth daily. 06/26/17   Briscoe Deutscher, DO  predniSONE (DELTASONE) 1 MG tablet TAKE TWO TABLETS BY MOUTH ONCE DAILY 09/17/17   Bo Merino, MD  predniSONE (DELTASONE) 5 MG tablet TAKE 1 TABLET BY MOUTH ONCE DAILY WITH BREAKFAST 11/16/17   Bo Merino, MD  PREVACID 24HR 15 MG capsule Take 15 mg by mouth Daily. 10/07/12   [provider]  SAVELLA 50 MG TABS tablet TAKE 1 TABLET BY MOUTH TWICE DAILY 10/17/17   Briscoe Deutscher, DO  traMADol (ULTRAM) 50 MG tablet TAKE 1 TABLET BY MOUTH TWICE DAILY AS NEEDED 12/18/17   Bo Merino, MD  Zoledronic Acid (RECLAST IV) Inject into the vein.    [provider]    Family History Family History  Problem Relation Age of Onset  . Breast cancer Mother   . Diabetes Father     Social History Social History   Tobacco Use  . Smoking status: Former Smoker    Packs/day: 0.75    Years: 10.00    Pack years: 7.50    Types: Cigarettes    Last attempt to quit: 1968    Years since quitting: 51.1  . Smokeless tobacco: Never Used  Substance Use Topics  . Alcohol use: No  . Drug use: No     Allergies   Adhesive [tape]; Celebrex [celecoxib]; Ciprofibrate; Cymbalta [duloxetine hcl]; Gabitril [tiagabine]; Keflex [cephalexin]; Lyrica [pregabalin]; Neurontin [gabapentin]; Nexium [esomeprazole]; Nsaids; Penicillins; Shrimp [shellfish allergy]; Sulfa antibiotics; Ciprofloxacin; and  Claritin-d 12 hour [loratadine-pseudoephedrine er]   Review of Systems Review of Systems  All other systems reviewed and are negative.    Physical Exam Updated Vital Signs BP 139/77 (BP Location: Right Arm)   Pulse (!) 109   Temp 99.8 F (37.7 C) (Oral)   Resp 16   SpO2 94%   Physical Exam  Constitutional: She is oriented to person, place, and time. She appears well-developed and well-nourished.  Non-toxic appearance. No distress.  HENT:  Head: Normocephalic and atraumatic.  Eyes: Conjunctivae, EOM and lids are normal. Pupils are equal, round, and reactive to light.  Neck: Normal range of motion. Neck supple. No tracheal deviation present. No thyroid mass present.  Cardiovascular: Normal rate, regular rhythm and normal heart sounds. Exam reveals no gallop.  No murmur heard. Pulmonary/Chest: Effort normal and breath  sounds normal. No stridor. No respiratory distress. She has no decreased breath sounds. She has no wheezes. She has no rhonchi. She has no rales.  Abdominal: Soft. Normal appearance and bowel sounds are normal. She exhibits no distension. There is no tenderness. There is no rebound and no CVA tenderness.  Musculoskeletal: Normal range of motion. She exhibits no edema or tenderness.  Neurological: She is alert and oriented to person, place, and time. She has normal strength. No cranial nerve deficit or sensory deficit. GCS eye subscore is 4. GCS verbal subscore is 5. GCS motor subscore is 6.  Skin: Skin is warm and dry. No abrasion and no rash noted.  Psychiatric: She has a normal mood and affect. Her speech is normal and behavior is normal.  Nursing note and vitals reviewed.    ED Treatments / Results  Labs (all labs ordered are listed, but only abnormal results are displayed) Labs Reviewed  CULTURE, BLOOD (ROUTINE X 2)  CULTURE, BLOOD (ROUTINE X 2)  URINALYSIS, ROUTINE W REFLEX MICROSCOPIC  BASIC METABOLIC PANEL  CBC  I-STAT CG4 LACTIC ACID, ED    EKG  EKG  Interpretation None       Radiology No results found.  Procedures Procedures (including critical care time)  Medications Ordered in ED Medications  sodium chloride 0.9 % bolus 1,000 mL (not administered)    And  sodium chloride 0.9 % bolus 1,000 mL (not administered)    And  sodium chloride 0.9 % bolus 500 mL (not administered)  0.9 %  sodium chloride infusion (not administered)     Initial Impression / Assessment and Plan / ED Course  I have reviewed the triage vital signs and the nursing notes.  Pertinent labs & imaging results that were available during my care of the patient were reviewed by me and considered in my medical decision making (see chart for details).     Patient given IV fluids and started on empiric antibiotics.  Chest x-ray without infiltrate.  Labs are pending and care signed over to Dr. Eulis Foster  Final Clinical Impressions(s) / ED Diagnoses   Final diagnoses:  None    ED Discharge Orders    None       Lacretia Leigh, MD 01/18/18 1438

## 2018-01-18 NOTE — Progress Notes (Addendum)
Consults were received from an ED physician for vancomycin and aztreonam per pharmacy dosing.  The patient's profile has been reviewed for ht/wt/allergies/indication/available labs.   Note - PCN and keflex allergies/intolerances documented but she appears to have tolerated cefepime in 2018  one time orders have been placed by EDP for vancomycin and aztreonam, no changes needed.   Further antibiotics/pharmacy consults should be ordered by admitting physician if indicated.    - Suggest using cefepime over aztreonam if antibiotics continued at admission                    Thank you, Doreene Eland, PharmD, BCPS.   01/18/2018 3:01 PM

## 2018-01-18 NOTE — Progress Notes (Signed)
Pharmacy Antibiotic Note  Eileen Wilkerson is a 84 y.o. female admitted on 01/18/2018 with UTI.  Pharmacy has been consulted for cefepime dosing. Urine culture from 2/4 (outpatient) reveals quinolone resistant E. Coli (susc to other abx tested)  Plan:  Cefepime 2gm IV x 1 then 1gm IV q24h  Follow renal function  Since tolerated cefepime, suspect would tolerate ceftriaxone.  Consider narrowing antibiotic  No allergy to cephalexin, intolerance of N/V  Height: 5' 3.25" (160.7 cm) Weight: 165 lb (74.8 kg) IBW/kg (Calculated) : 52.98  Temp (24hrs), Avg:100 F (37.8 C), Min:99.3 F (37.4 C), Max:100.8 F (38.2 C)  Recent Labs  Lab 01/18/18 1358 01/18/18 1407  WBC 10.2  --   CREATININE 1.09*  --   LATICACIDVEN  --  1.64    Estimated Creatinine Clearance: 38.1 mL/min (A) (by C-G formula based on SCr of 1.09 mg/dL (H)).    Allergies  Allergen Reactions  . Adhesive [Tape] Rash  . Celebrex [Celecoxib] Hives  . Ciprofibrate Nausea Only  . Cymbalta [Duloxetine Hcl] Swelling  . Gabitril [Tiagabine] Swelling  . Keflex [Cephalexin] Nausea And Vomiting  . Lyrica [Pregabalin] Swelling  . Neurontin [Gabapentin] Swelling  . Nexium [Esomeprazole] Rash  . Nsaids Rash  . Penicillins Rash    Injection site reaction. Tolerated cefepime in past Has patient had a PCN reaction causing immediate rash, facial/tongue/throat swelling, SOB or lightheadedness with hypotension: No Has patient had a PCN reaction causing severe rash involving mucus membranes or skin necrosis: No Has patient had a PCN reaction that required hospitalization No Has patient had a PCN reaction occurring within the last 10 years: No If all of the above answers are "NO", then may proceed with Cephalosporin use.   . Shrimp [Shellfish Allergy] Anaphylaxis    Per patient "shrimp only"  . Sulfa Antibiotics Nausea And Vomiting  . Ciprofloxacin Other (See Comments)    dizziness  . Claritin-D 12 Hour  [Loratadine-Pseudoephedrine Er] Anxiety    Antimicrobials this admission: 2/8 vanco/aztreonam x 1 2/8 cefepime >>  Dose adjustments this admission:  Microbiology results: 2/8 UCx 2/8 BCx  2/4 UCx (outpatient): E. Coli (R to levoflox, cipro,  susc to amp, amox/clav, ceftriaxone, cefepime, tmp/smz, NTF)  Thank you for allowing pharmacy to be a part of this patient's care.  Doreene Eland, PharmD, BCPS.   Pager: [phone] 01/18/2018 7:04 PM

## 2018-01-18 NOTE — Telephone Encounter (Signed)
Pt's daughter called reporting EMS at home; called after finding pt on floor. Pt questioning if she needs to go to ED. See previous encounters. Advised to go to ED. Informed this was direct order from her MD.

## 2018-01-18 NOTE — ED Notes (Signed)
ED TO INPATIENT HANDOFF REPORT  Name/Age/Gender Eileen Wilkerson 84 y.o. female  Code Status Code Status History    Date Active Date Inactive Code Status Order ID Comments User Context   04/25/2017 14:13 04/27/2017 18:43 Full Code [id number]  Jonetta Osgood, MD ED   12/28/2015 18:55 01/02/2016 20:15 Full Code [id number]  Bonnielee Haff, MD Inpatient      Home/SNF/Other Home  Chief Complaint Possible UTI  Level of Care/Admitting Diagnosis ED Disposition    ED Disposition Condition [facility] Area: [facility] [[id number]]  Level of Care: Med-Surg [16]  Diagnosis: Sepsis due to urinary tract infection [facility]) [[id number]]  Admitting Physician: Patrecia Pour [phone]  Attending Physician: Patrecia Pour [phone]  Estimated length of stay: past midnight tomorrow  Certification:: I certify this patient will need inpatient services for at least 2 midnights  PT Class (Do Not Modify): Inpatient [101]  PT Acc Code (Do Not Modify): Private [1]       Medical History Past Medical History:  Diagnosis Date  . Adrenal failure ([place])   . Arthritis   . Cataract   . Fibromyalgia 2008  . Osteoporosis   . RA (rheumatoid arthritis) ([facility])     Allergies Allergies  Allergen Reactions  . Adhesive [Tape] Rash  . Celebrex [Celecoxib] Hives  . Ciprofibrate Nausea Only  . Cymbalta [Duloxetine Hcl] Swelling  . Gabitril [Tiagabine] Swelling  . Keflex [Cephalexin] Nausea And Vomiting  . Lyrica [Pregabalin] Swelling  . Neurontin [Gabapentin] Swelling  . Nexium [Esomeprazole] Rash  . Nsaids Rash  . Penicillins Rash    Injection site reaction. Tolerated cefepime in past Has patient had a PCN reaction causing immediate rash, facial/tongue/throat swelling, SOB or lightheadedness with hypotension: No Has patient had a PCN reaction causing severe rash involving mucus membranes or skin necrosis: No Has patient had a PCN reaction that required hospitalization No Has  patient had a PCN reaction occurring within the last 10 years: No If all of the above answers are "NO", then may proceed with Cephalosporin use.   . Shrimp [Shellfish Allergy] Anaphylaxis    Per patient "shrimp only"  . Sulfa Antibiotics Nausea And Vomiting  . Ciprofloxacin Other (See Comments)    dizziness  . Claritin-D 12 Hour [Loratadine-Pseudoephedrine Er] Anxiety    IV Location/Drains/Wounds Patient Lines/Drains/Airways Status   Active Line/Drains/Airways    Name:   Placement date:   Placement time:   Site:   Days:   Peripheral IV 01/18/18 Left Antecubital   01/18/18    1350    Antecubital   less than 1   Peripheral IV 01/18/18 Left;Posterior Forearm   01/18/18    1415    Forearm   less than 1          Labs/Imaging Results for orders placed or performed during the hospital encounter of 01/18/18 (from the past 48 hour(s))  Basic metabolic panel     Status: Abnormal   Collection Time: 01/18/18  1:58 PM  Result Value Ref Range   Sodium 138 135 - 145 mmol/L   Potassium 2.8 (L) 3.5 - 5.1 mmol/L   Chloride 96 (L) 101 - 111 mmol/L   CO2 29 22 - 32 mmol/L   Glucose, Bld 110 (H) 65 - 99 mg/dL   BUN 21 (H) 6 - 20 mg/dL   Creatinine, Ser 1.09 (H) 0.44 - 1.00 mg/dL   Calcium 10.2 8.9 - 10.3 mg/dL   GFR calc non Af Wyvonnia Lora  46 (L) >60 mL/min   GFR calc Af Amer 53 (L) >60 mL/min    Comment: (NOTE) The eGFR has been calculated using the CKD EPI equation. This calculation has not been validated in all clinical situations. eGFR's persistently <60 mL/min signify possible Chronic Kidney Disease.    Anion gap 13 5 - 15    Comment: Performed at [facility], [place] [address], [place], [place] [address]  CBC     Status: Abnormal   Collection Time: 01/18/18  1:58 PM  Result Value Ref Range   WBC 10.2 4.0 - 10.5 K/uL   RBC 3.97 3.87 - 5.11 MIL/uL   Hemoglobin 13.1 12.0 - 15.0 g/dL   HCT 39.2 36.0 - 46.0 %   MCV 98.7 78.0 - 100.0 fL   MCH 33.0 26.0 - 34.0 pg   MCHC 33.4  30.0 - 36.0 g/dL   RDW 15.7 (H) 11.5 - 15.5 %   Platelets 282 150 - 400 K/uL    Comment: Performed at [facility], [place] [address] [place], [place] [address]  I-Stat CG4 Lactic Acid, ED  (not at  [facility])     Status: None   Collection Time: 01/18/18  2:07 PM  Result Value Ref Range   Lactic Acid, Venous 1.64 0.5 - 1.9 mmol/L  Urinalysis, Routine w reflex microscopic- may I&O cath if menses     Status: Abnormal   Collection Time: 01/18/18  3:30 PM  Result Value Ref Range   Color, Urine YELLOW YELLOW   APPearance CLOUDY (A) CLEAR   Specific Gravity, Urine 1.014 1.005 - 1.030   pH 6.0 5.0 - 8.0   Glucose, UA NEGATIVE NEGATIVE mg/dL   Hgb urine dipstick MODERATE (A) NEGATIVE   Bilirubin Urine NEGATIVE NEGATIVE   Ketones, ur NEGATIVE NEGATIVE mg/dL   Protein, ur NEGATIVE NEGATIVE mg/dL   Nitrite NEGATIVE NEGATIVE   Leukocytes, UA LARGE (A) NEGATIVE   RBC / HPF 6-30 0 - 5 RBC/hpf   WBC, UA TOO NUMEROUS TO COUNT 0 - 5 WBC/hpf   Bacteria, UA MANY (A) NONE SEEN   Squamous Epithelial / LPF 0-5 (A) NONE SEEN   Mucus PRESENT    Hyaline Casts, UA PRESENT    Non Squamous Epithelial 0-5 (A) NONE SEEN    Comment: Performed at [facility], [place] [address], [place],  [address]   Dg Chest 2 View  Result Date: 01/18/2018 CLINICAL DATA:  Shortness of breath, productive cough. EXAM: CHEST  2 VIEW COMPARISON:  Radiographs of July 11, 2017. FINDINGS: The heart size and mediastinal contours are within normal limits. No pneumothorax or pleural effusion is noted. Right lung is clear. Elevated left hemidiaphragm is noted with mild left basilar subsegmental atelectasis or scarring. The visualized skeletal structures are unremarkable. IMPRESSION: Elevated left hemidiaphragm with mild left basilar subsegmental atelectasis or scarring. Electronically Signed   By: Marijo Conception, M.D.   On: 01/18/2018 11:50    Pending Labs Unresulted Labs (From admission, onward)    Start     Ordered   01/18/18 1656  Urine culture  STAT,   STAT     01/18/18 1655   01/18/18 1108  Blood Culture (routine x 2)  BLOOD CULTURE X 2,   STAT     01/18/18 1108   Signed and Held  Creatinine, serum  (enoxaparin (LOVENOX)    CrCl >/= 30 ml/min)  Weekly,   R    Comments:  while on enoxaparin therapy  Signed and Held   Signed and Held  Basic metabolic panel  Tomorrow morning,   R     Signed and Held   Signed and Held  Magnesium  Tomorrow morning,   R     Signed and Held      Vitals/Pain Today's Vitals   01/18/18 1730 01/18/18 1800 01/18/18 1829 01/18/18 1901  BP: 118/68 133/67  135/82  Pulse: 77 80  86  Resp: _0 Temp:      TempSrc:      SpO2: 98% 100%  (!) 86%  Weight:      Height:      PainSc:   0-No pain     Isolation Precautions No active isolations  Medications Medications  ceFEPIme (MAXIPIME) 1 g in dextrose 5 % 50 mL IVPB (not administered)  sodium chloride 0.9 % bolus 1,000 mL (0 mLs Intravenous Stopped 01/18/18 1506)    And  sodium chloride 0.9 % bolus 1,000 mL (0 mLs Intravenous Stopped 01/18/18 1506)    And  sodium chloride 0.9 % bolus 500 mL (0 mLs Intravenous Stopped 01/18/18 1601)  acetaminophen (TYLENOL) tablet 650 mg (650 mg Oral Given 01/18/18 1452)  vancomycin (VANCOCIN) IVPB 1000 mg/200 mL premix (0 mg Intravenous Stopped 01/18/18 1658)  aztreonam (AZACTAM) 2 GM IVPB (0 g Intravenous Stopped 01/18/18 1537)  ceFEPIme (MAXIPIME) 2 g in dextrose 5 % 50 mL IVPB (2 g Intravenous New Bag/Given 01/18/18 1902)    Mobility walks with device

## 2018-01-18 NOTE — ED Notes (Signed)
Attempted to call report at scheduled time

## 2018-01-18 NOTE — Telephone Encounter (Signed)
See new message pt found in floor EMS called

## 2018-01-19 ENCOUNTER — Inpatient Hospital Stay (HOSPITAL_COMMUNITY): Payer: Medicare Other

## 2018-01-19 ENCOUNTER — Other Ambulatory Visit: Payer: Self-pay

## 2018-01-19 LAB — BASIC METABOLIC PANEL
Anion gap: 9 (ref 5–15)
BUN: 16 mg/dL (ref 6–20)
CO2: 26 mmol/L (ref 22–32)
Calcium: 8.9 mg/dL (ref 8.9–10.3)
Chloride: 107 mmol/L (ref 101–111)
Creatinine, Ser: 0.93 mg/dL (ref 0.44–1.00)
GFR calc Af Amer: >60 mL/min (ref >60)
GFR calc non Af Amer: 55 mL/min — ABNORMAL LOW (ref >60)
Glucose, Bld: 109 mg/dL — ABNORMAL HIGH (ref 65–99)
Potassium: 3.2 mmol/L — ABNORMAL LOW (ref 3.5–5.1)
Sodium: 142 mmol/L (ref 135–145)

## 2018-01-19 LAB — URINE CULTURE

## 2018-01-19 LAB — MAGNESIUM: Magnesium: 1.7 mg/dL (ref 1.7–2.4)

## 2018-01-19 MED ORDER — POTASSIUM CHLORIDE CRYS ER 20 MEQ PO TBCR: 60.0000 meq | EXTENDED_RELEASE_TABLET | Freq: Once | ORAL | Status: DC

## 2018-01-19 MED ORDER — ACETAMINOPHEN 325 MG PO TABS
650.0000 mg | ORAL_TABLET | Freq: Once | ORAL | Status: AC
  Administered 2018-01-19: 650 mg via ORAL
  Filled 2018-01-19: qty 2

## 2018-01-19 MED ORDER — POTASSIUM CHLORIDE CRYS ER 10 MEQ PO TBCR
30.0000 meq | EXTENDED_RELEASE_TABLET | ORAL | Status: AC
  Administered 2018-01-19 (×2): 30 meq via ORAL
  Filled 2018-01-19 (×2): qty 1

## 2018-01-19 MED ORDER — ORAL CARE MOUTH RINSE
15.0000 mL | Freq: Two times a day (BID) | OROMUCOSAL | Status: DC
  Administered 2018-01-19 – 2018-01-21 (×3): 15 mL via OROMUCOSAL

## 2018-01-19 NOTE — Progress Notes (Signed)
At 2220 pt appeared to be spiking fever. VS obtained, temp 102.9, HR 128, resp 40, sats 87 r/a. BP 178/84 MD notified, tylenol given along with 500cc bolus and Lopressor 5 x 1. 2230 BP 152/67, resp 34, 91% 3L [place] HR 91. At 2300 fever still 102.8, MD notified, did not want ibuprofen due to renal function. Ordered ice. Iced pt groins and axillas. MN temp 103.2, MD notified, ordered Tylenol be repeated early. Tylenol given 0055. 0200 temp 100.1, pt more alert, changed linen due to leaking ice bags. Pt tolerated well. Will continue to monitor. Hoyle Barr, RN-C

## 2018-01-19 NOTE — Progress Notes (Signed)
Patient ID: Eileen Wilkerson, female   DOB: [birth year], 84 y.o.   MRN: [id number]  PROGRESS NOTE    Eileen Wilkerson  XHB:[id number] DOB: 12/05/1932 DOA: 01/18/2018 PCP: Briscoe Deutscher, DO   Brief Narrative:  84 year old female with history of RA on Plaquenil and prednisone, recurrent UTI, chronic diastolic CHF, hyperlipidemia, fibromyalgia presented with fever and vomiting with weakness.  Patient was admitted with sepsis secondary to UTI and hypoxia and started on intravenous antibiotics.   Assessment & Plan:   Principal Problem:   Sepsis due to urinary tract infection ([place]) Active Problems:   Rheumatoid arthritis ([place])   Long term current use of systemic steroids   Fibromyalgia   GERD (gastroesophageal reflux disease)   Hypokalemia   Physical deconditioning   Immunosuppressed status (HCC)   E. coli UTI   Hypoxia   Chronic diastolic CHF (congestive heart failure) ([place])   Acute hypoxia: -May be secondary to sepsis.  No clear-cut evidence of pneumonia on chest x-ray this morning.  -Still requiring oxygen via nasal cannula at 3 L/min -Wean off oxygen as able -Incentive spirometry -SLP evaluation  -If hypoxia does not improve, might need CT scan of the chest  Sepsis due to E. coli UTI: Urine culture from PCP visit grew E. coli resistant only to fluoroquinolones. -Hemodynamically improving.  Antibiotic plan as below  Probable E. coli UTI -Continue cefepime.  Follow cultures.  Chronic diastolic CHF: Echo June 8101 showed EF 75-10%, grade 1 diastolic dysfunction -Stable.  Restart home Lasix.  Strict input and output.  Daily weights  Hypokalemia: Due to poor  oral intake.  - Replace and recheck in AM  Deconditioning/weakness: Found on floor without injury. Has been getting around less since husband passed in Oct 2018. Lives alone with aides.  - PT eval  Fibromyalgia: Stable - Continue savella, tramadol prn  Rheumatoid arthritis:  - Will hold plaquenil while  hospitalized with sepsis - Continue prednisone. BP appears stable.  Outpatient follow-up  DVT prophylaxis: Lovenox  Code Status: Full  Family Communication:  None at bedside Disposition Plan:  Depends on clinical outcome   Consultants: None Procedures: None  Antimicrobials:   Cefepime from 01/18/2018 onwards Patient received 1 dose of aztreonam and vancomycin on 01/18/2018   Subjective: Patient seen and examined at bedside.  She feels very weak and tired.  She is having temperature spikes overnight.  No current nausea or vomiting.  Objective: Vitals:   01/19/18 0000 01/19/18 0052 01/19/18 0200 01/19/18 0529  BP:    139/69  Pulse:   90 92  Resp: (!) 28  (!) 28 20  Temp: (!) 103.2 F (39.6 C) (!) 103.1 F (39.5 C) 100.1 F (37.8 C) 98.4 F (36.9 C)  TempSrc: Oral  Oral Oral  SpO2: 94%  94% 98%  Weight:    79.9 kg (176 lb 2.4 oz)  Height:        Intake/Output Summary (Last 24 hours) at 01/19/2018 1137 Last data filed at 01/19/2018 1044 Gross per 24 hour  Intake 743 ml  Output 200 ml  Net 543 ml   Filed Weights   01/18/18 1511 01/18/18 2113 01/19/18 0529  Weight: 74.8 kg (165 lb) 78.3 kg (172 lb 9.9 oz) 79.9 kg (176 lb 2.4 oz)    Examination:  General exam: Elderly female lying in bed no acute distress. Respiratory system: Bilateral decreased breath sound at bases Cardiovascular system: S1 & S2 heard, rate controlled  gastrointestinal system: Abdomen is nondistended, soft and nontender. Normal bowel  sounds heard. Central nervous system: Alert and oriented. No focal neurological deficits. Moving extremities Extremities: No cyanosis, clubbing, edema  Skin: No rashes, lesions or ulcers Lymph: No cervical lymph adenopathy    Data Reviewed: I have personally reviewed following labs and imaging studies  CBC: Recent Labs  Lab 01/18/18 1358  WBC 10.2  HGB 13.1  HCT 39.2  MCV 98.7  PLT 144   Basic Metabolic Panel: Recent Labs  Lab 01/18/18 1358 01/19/18 0342    NA 138 142  K 2.8* 3.2*  CL 96* 107  CO2 29 26  GLUCOSE 110* 109*  BUN 21* 16  CREATININE 1.09* 0.93  CALCIUM 10.2 8.9  MG  --  1.7   GFR: Estimated Creatinine Clearance: 45.9 mL/min (by C-G formula based on SCr of 0.93 mg/dL). Liver Function Tests: No results for input(s): AST, ALT, ALKPHOS, BILITOT, PROT, ALBUMIN in the last 168 hours. No results for input(s): LIPASE, AMYLASE in the last 168 hours. No results for input(s): AMMONIA in the last 168 hours. Coagulation Profile: No results for input(s): INR, PROTIME in the last 168 hours. Cardiac Enzymes: No results for input(s): CKTOTAL, CKMB, CKMBINDEX, TROPONINI in the last 168 hours. BNP (last 3 results) No results for input(s): PROBNP in the last 8760 hours. HbA1C: No results for input(s): HGBA1C in the last 72 hours. CBG: No results for input(s): GLUCAP in the last 168 hours. Lipid Profile: No results for input(s): CHOL, HDL, LDLCALC, TRIG, CHOLHDL, LDLDIRECT in the last 72 hours. Thyroid Function Tests: No results for input(s): TSH, T4TOTAL, FREET4, T3FREE, THYROIDAB in the last 72 hours. Anemia Panel: No results for input(s): VITAMINB12, FOLATE, FERRITIN, TIBC, IRON, RETICCTPCT in the last 72 hours. Sepsis Labs: Recent Labs  Lab 01/18/18 1407  LATICACIDVEN 1.64    Recent Results (from the past 240 hour(s))  Urine Culture     Status: Abnormal   Collection Time: 01/14/18  2:43 PM  Result Value Ref Range Status   MICRO NUMBER: [id number]  Final   SPECIMEN QUALITY: ADEQUATE  Final   Sample Source URINE  Final   STATUS: FINAL  Final   ISOLATE 1: Escherichia coli (A)  Final      Susceptibility   Escherichia coli - URINE CULTURE, REFLEX    AMOX/CLAVULANIC 4 Sensitive     AMPICILLIN 8 Sensitive     AMPICILLIN/SULBACTAM 4 Sensitive     CEFAZOLIN* <=4 Not Reportable      * For infections other than uncomplicated UTIcaused by E. coli, K. pneumoniae or P. mirabilis:Cefazolin is resistant if MIC > or = 8  mcg/mL.(Distinguishing susceptible versus intermediatefor isolates with MIC < or = 4 mcg/mL requiresadditional testing.)For uncomplicated UTI caused by E. coli,K. pneumoniae or P. mirabilis: Cefazolin issusceptible if MIC <32 mcg/mL and predictssusceptible to the oral agents cefaclor, cefdinir,cefpodoxime, cefprozil, cefuroxime, cephalexinand loracarbef.    CEFEPIME <=1 Sensitive     CEFTRIAXONE <=1 Sensitive     CIPROFLOXACIN >=4 Resistant     LEVOFLOXACIN >=8 Resistant     ERTAPENEM <=0.5 Sensitive     GENTAMICIN <=1 Sensitive     IMIPENEM <=0.25 Sensitive     NITROFURANTOIN 32 Sensitive     PIP/TAZO <=4 Sensitive     TOBRAMYCIN <=1 Sensitive     TRIMETH/SULFA* <=20 Sensitive      * For infections other than uncomplicated UTIcaused by E. coli, K. pneumoniae or P. mirabilis:Cefazolin is resistant if MIC > or = 8 mcg/mL.(Distinguishing susceptible versus intermediatefor isolates with MIC < or = 4  mcg/mL requiresadditional testing.)For uncomplicated UTI caused by E. coli,K. pneumoniae or P. mirabilis: Cefazolin issusceptible if MIC <32 mcg/mL and predictssusceptible to the oral agents cefaclor, cefdinir,cefpodoxime, cefprozil, cefuroxime, cephalexinand loracarbef.Legend:S = Susceptible  I = IntermediateR = Resistant  NS = Not susceptible* = Not tested  NR = Not reported**NN = See antimicrobic comments  Blood Culture (routine x 2)     Status: None (Preliminary result)   Collection Time: 01/18/18  2:00 PM  Result Value Ref Range Status   Specimen Description   Final    BLOOD LEFT ANTECUBITAL Performed at [facility] [address], [place], [place] [address]    Special Requests   Final    BOTTLES DRAWN AEROBIC AND ANAEROBIC Blood Culture adequate volume Performed at [facility] [address], [place], [place] [address]    Culture   Final    NO GROWTH < 24 HOURS Performed at [facility] [address] [place], [place] [address]    Report  Status PENDING  Incomplete  Blood Culture (routine x 2)     Status: None (Preliminary result)   Collection Time: 01/18/18  2:27 PM  Result Value Ref Range Status   Specimen Description   Final    BLOOD LEFT FOREARM Performed at [facility] [address], [place], [place] [address]    Special Requests   Final    BOTTLES DRAWN AEROBIC AND ANAEROBIC Blood Culture adequate volume Performed at [facility] [address] [place], [place] [address]    Culture   Final    NO GROWTH < 24 HOURS Performed at [facility] [address] [place],  [address]    Report Status PENDING  Incomplete         Radiology Studies: Dg Chest 2 View  Result Date: 01/18/2018 CLINICAL DATA:  Shortness of breath, productive cough. EXAM: CHEST  2 VIEW COMPARISON:  Radiographs of July 11, 2017. FINDINGS: The heart size and mediastinal contours are within normal limits. No pneumothorax or pleural effusion is noted. Right lung is clear. Elevated left hemidiaphragm is noted with mild left basilar subsegmental atelectasis or scarring. The visualized skeletal structures are unremarkable. IMPRESSION: Elevated left hemidiaphragm with mild left basilar subsegmental atelectasis or scarring. Electronically Signed   By: Marijo Conception, M.D.   On: 01/18/2018 11:50   Dg Chest Port 1 View  Result Date: 01/19/2018 CLINICAL DATA:  84 y/o  F; acute respiratory failure with hypoxia. EXAM: PORTABLE CHEST 1 VIEW COMPARISON:  01/18/2018 chest radiograph FINDINGS: Normal cardiac silhouette. Aortic atherosclerosis with calcification. Elevated left hemidiaphragm with stable chronic scarring/atelectasis at left lung base. No new consolidation, effusion, or pneumothorax. Anterior cervical discectomy and fusion hardware. Multilevel degenerative changes of the thoracic spine. No pleural effusion or pneumothorax. No acute osseous abnormality is evident. IMPRESSION: Stable chronic  scarring/atelectasis at left lung base and elevated left hemidiaphragm. No acute pulmonary process identified. Electronically Signed   By: Kristine Garbe M.D.   On: 01/19/2018 05:18        Scheduled Meds: . atorvastatin  20 mg Oral q1800  . enoxaparin (LOVENOX) injection  40 mg Subcutaneous Q24H  . fluticasone  2 spray Each Nare Daily  . furosemide  80 mg Oral Daily  . mouth rinse  15 mL Mouth Rinse BID  . Milnacipran  50 mg Oral BID  . predniSONE  7 mg Oral Q breakfast  . sodium chloride flush  3  mL Intravenous Q12H   Continuous Infusions: . sodium chloride    . ceFEPime (MAXIPIME) IV       LOS: 1 day        Aline August, MD [facility] Pager [phone]  If 7PM-7AM, please contact night-coverage [email or web address] Password [facility] 01/19/2018, 11:37 AM

## 2018-01-19 NOTE — Evaluation (Signed)
Physical Therapy Evaluation Patient Details Name: Eileen Wilkerson MRN: [id number] DOB: Nov 17, 1932 Today's Date: 01/19/2018   History of Present Illness  Pt admitted with generalized weakness and sepsit 2* UTI.  Pt with hx of RA, fibromyalgia, osteoporosis, TSR and cervical fusion  Clinical Impression  Pt admitted as above and presenting with functional mobility limitations 2* generalized weakness, deconditioning, and ambulatory balance deficits.  Pt would benefit from follow up rehab at SNF level to maximize IND and safety prior to return home with limited assist.    Follow Up Recommendations SNF    Equipment Recommendations  None recommended by PT    Recommendations for Other Services OT consult     Precautions / Restrictions Precautions Precautions: Fall Restrictions Weight Bearing Restrictions: No      Mobility  Bed Mobility Overal bed mobility: Needs Assistance Bed Mobility: Sit to Supine       Sit to supine: Mod assist   General bed mobility comments: assist with bil LEs  Transfers Overall transfer level: Needs assistance Equipment used: Rolling walker (2 wheeled) Transfers: Sit to/from Stand Sit to Stand: Min assist;Mod assist         General transfer comment: cues for transition position and use of UEs to self assist  Ambulation/Gait Ambulation/Gait assistance: Min assist;+2 physical assistance;+2 safety/equipment Ambulation Distance (Feet): 5 Feet Assistive device: Rolling walker (2 wheeled) Gait Pattern/deviations: Step-to pattern;Step-through pattern;Decreased step length - right;Decreased step length - left;Shuffle;Trunk flexed Gait velocity: decr Gait velocity interpretation: Below normal speed for age/gender General Gait Details: chair to bed only 2* ++ pt fatigue and nausea.  Cues for posture and position from RW  Stairs            Wheelchair Mobility    Modified Rankin (Stroke Patients Only)       Balance Overall balance  assessment: Needs assistance Sitting-balance support: No upper extremity supported;Feet supported Sitting balance-Leahy Scale: Good     Standing balance support: Bilateral upper extremity supported Standing balance-Leahy Scale: Poor                               Pertinent Vitals/Pain Pain Assessment: No/denies pain    Home Living Family/patient expects to be discharged to:: Private residence Living Arrangements: Alone Available Help at Discharge: Family Type of Home: House Home Access: Ramped entrance     Home Layout: One level Home Equipment: Bedside commode;Cane - single point;Walker - 2 wheels Additional Comments: Pt has intermittent assist of aides    Prior Function Level of Independence: Independent with assistive device(s)         Comments: Dtr reports pt continues to drive and uses cane outside of home and occasionally RW inside home     Hand Dominance        Extremity/Trunk Assessment   Upper Extremity Assessment Upper Extremity Assessment: Generalized weakness    Lower Extremity Assessment Lower Extremity Assessment: Generalized weakness       Communication   Communication: No difficulties  Cognition Arousal/Alertness: Awake/alert Behavior During Therapy: WFL for tasks assessed/performed Overall Cognitive Status: Within Functional Limits for tasks assessed                                        General Comments      Exercises     Assessment/Plan    PT Assessment Patient needs continued  PT services  PT Problem List Decreased strength;Decreased activity tolerance;Decreased balance;Decreased mobility;Decreased cognition;Decreased knowledge of use of DME;Obesity       PT Treatment Interventions DME instruction;Gait training;Functional mobility training;Therapeutic activities;Therapeutic exercise;Patient/family education    PT Goals (Current goals can be found in the Care Plan section)  Acute Rehab PT  Goals Patient Stated Goal: Feel better PT Goal Formulation: With patient Time For Goal Achievement: 02/02/18 Potential to Achieve Goals: Fair    Frequency Min 3X/week   Barriers to discharge        Co-evaluation               AM-PAC PT "6 Clicks" Daily Activity  Outcome Measure Difficulty turning over in bed (including adjusting bedclothes, sheets and blankets)?: A Lot Difficulty moving from lying on back to sitting on the side of the bed? : Unable Difficulty sitting down on and standing up from a chair with arms (e.g., wheelchair, bedside commode, etc,.)?: Unable Help needed moving to and from a bed to chair (including a wheelchair)?: A Lot Help needed walking in hospital room?: A Little Help needed climbing 3-5 steps with a railing? : A Lot 6 Click Score: 11    End of Session Equipment Utilized During Treatment: Gait belt;Oxygen Activity Tolerance: Patient limited by fatigue Patient left: in bed;with call bell/phone within reach;with family/visitor present Nurse Communication: Mobility status PT Visit Diagnosis: Unsteadiness on feet (R26.81);Muscle weakness (generalized) (M62.81);Difficulty in walking, not elsewhere classified (R26.2)    Time: 3818-4037 PT Time Calculation (min) (ACUTE ONLY): 20 min   Charges:   PT Evaluation $PT Eval Low Complexity: 1 Low     PT G Codes:        Pg [phone]    Gaia Gullikson 01/19/2018, 1:42 PM

## 2018-01-20 LAB — CBC WITH DIFFERENTIAL/PLATELET
Basophils Absolute: 0 10*3/uL (ref 0.0–0.1)
Basophils Relative: 0 %
Eosinophils Absolute: 0.3 10*3/uL (ref 0.0–0.7)
Eosinophils Relative: 2 %
HCT: 37 % (ref 36.0–46.0)
Hemoglobin: 11.9 g/dL — ABNORMAL LOW (ref 12.0–15.0)
Lymphocytes Relative: 20 %
Lymphs Abs: 2.3 10*3/uL (ref 0.7–4.0)
MCH: 32.2 pg (ref 26.0–34.0)
MCHC: 32.2 g/dL (ref 30.0–36.0)
MCV: 100.3 fL — ABNORMAL HIGH (ref 78.0–100.0)
Monocytes Absolute: 1.1 10*3/uL — ABNORMAL HIGH (ref 0.1–1.0)
Monocytes Relative: 10 %
Neutro Abs: 7.9 10*3/uL — ABNORMAL HIGH (ref 1.7–7.7)
Neutrophils Relative %: 68 %
Platelets: 248 10*3/uL (ref 150–400)
RBC: 3.69 MIL/uL — ABNORMAL LOW (ref 3.87–5.11)
RDW: 15.7 % — ABNORMAL HIGH (ref 11.5–15.5)
WBC: 11.7 10*3/uL — ABNORMAL HIGH (ref 4.0–10.5)

## 2018-01-20 LAB — COMPREHENSIVE METABOLIC PANEL
ALT: 24 U/L (ref 14–54)
AST: 34 U/L (ref 15–41)
Albumin: 3 g/dL — ABNORMAL LOW (ref 3.5–5.0)
Alkaline Phosphatase: 38 U/L (ref 38–126)
Anion gap: 9 (ref 5–15)
BUN: 16 mg/dL (ref 6–20)
CO2: 27 mmol/L (ref 22–32)
Calcium: 9.3 mg/dL (ref 8.9–10.3)
Chloride: 102 mmol/L (ref 101–111)
Creatinine, Ser: 0.96 mg/dL (ref 0.44–1.00)
GFR calc Af Amer: >60 mL/min (ref >60)
GFR calc non Af Amer: 53 mL/min — ABNORMAL LOW (ref >60)
Glucose, Bld: 100 mg/dL — ABNORMAL HIGH (ref 65–99)
Potassium: 3 mmol/L — ABNORMAL LOW (ref 3.5–5.1)
Sodium: 138 mmol/L (ref 135–145)
Total Bilirubin: 0.7 mg/dL (ref 0.3–1.2)
Total Protein: 6.6 g/dL (ref 6.5–8.1)

## 2018-01-20 LAB — MAGNESIUM: Magnesium: 1.6 mg/dL — ABNORMAL LOW (ref 1.7–2.4)

## 2018-01-20 MED ORDER — POTASSIUM CHLORIDE CRYS ER 20 MEQ PO TBCR
40.0000 meq | EXTENDED_RELEASE_TABLET | ORAL | Status: AC
  Administered 2018-01-20 (×2): 40 meq via ORAL
  Filled 2018-01-20 (×2): qty 2

## 2018-01-20 MED ORDER — MAGNESIUM SULFATE 2 GM/50ML IV SOLN
2.0000 g | Freq: Once | INTRAVENOUS | Status: AC
  Administered 2018-01-20: 2 g via INTRAVENOUS
  Filled 2018-01-20: qty 50

## 2018-01-20 NOTE — Evaluation (Signed)
Clinical/Bedside Swallow Evaluation Patient Details  Name: Eileen Wilkerson MRN: [id number] Date of Birth: 1932/03/01  Today's Date: 01/20/2018 Time: SLP Start Time (ACUTE ONLY): 8921 SLP Stop Time (ACUTE ONLY): 0843 SLP Time Calculation (min) (ACUTE ONLY): 19 min  Past Medical History:  Past Medical History:  Diagnosis Date  . Adrenal failure ([place])   . Arthritis   . Cataract   . Fibromyalgia 2008  . Osteoporosis   . RA (rheumatoid arthritis) ([place])    Past Surgical History:  Past Surgical History:  Procedure Laterality Date  . BACK SURGERY    . BILATERAL CARPAL TUNNEL RELEASE    . CATARACT EXTRACTION, BILATERAL  2004  . CERVICAL FUSION  2011,2010,2008  . HEEL SPUR SURGERY  2004  . ROTATOR CUFF REPAIR    . TONSILLECTOMY AND ADENOIDECTOMY  1947  . TOTAL SHOULDER ARTHROPLASTY     HPI:  84 year old female with history of RA, GERD, recurrent UTI, chronic diastolic CHF, hyperlipidemia, fibromyalgia presented with fever and vomiting with weakness. Patient was admitted with sepsis secondary to UTI and hypoxia. Pt with hx of RA, fibromyalgia, osteoporosis, TSR and cervical fusion. Seen by SLP during prior admission 12/29/15 with findings of functional oropharyngeal swallow, mild aspiration risk due to hx of GERD. CXR 01/19/18 shows stable chronic scarring/atelectasis at left lung base, no acute findings.   Assessment / Plan / Recommendation Clinical Impression    Patient presents with oropharyngeal swallow which appears at bedside to be within functional limits with adequate airway protection. No overt signs of aspiration observed despite challenging with consecutive straw sips of thin liquids in excess of 3oz. Subtle belching noted after rapid intake. Pt does have a history of GERD and is thus at mild risk for aspiration. She reports this is well-controlled; at times she avoids tomato-based products. Reinforced esophageal precautions including upright during and after meals, alternate  solids and liquids should this ease symptoms. Recommend regular diet with thin liquids, no further skilled ST needs identified. Will s/o.   SLP Visit Diagnosis: Dysphagia, unspecified (R13.10)    Aspiration Risk  Mild aspiration risk    Diet Recommendation Regular;Thin liquid   Liquid Administration via: Cup;Straw Medication Administration: Whole meds with liquid Supervision: Patient able to self feed Compensations: Follow solids with liquid Postural Changes: Seated upright at 90 degrees;Remain upright for at least 30 minutes after po intake    Other  Recommendations Oral Care Recommendations: Oral care BID   Follow up Recommendations None      Frequency and Duration            Prognosis Prognosis for Safe Diet Advancement: Good      Swallow Study   General Date of Onset: 01/18/18 HPI: 84 year old female with history of RA, GERD, recurrent UTI, chronic diastolic CHF, hyperlipidemia, fibromyalgia presented with fever and vomiting with weakness. Patient was admitted with sepsis secondary to UTI and hypoxia. Pt with hx of RA, fibromyalgia, osteoporosis, TSR and cervical fusion. Seen by SLP during prior admission 12/29/15 with findings of functional oropharyngeal swallow, mild aspiration risk due to hx of GERD. CXR 01/19/18 shows stable chronic scarring/atelectasis at left lung base, no acute findings. Type of Study: Bedside Swallow Evaluation Previous Swallow Assessment: see HPI Diet Prior to this Study: Dysphagia 3 (soft);Thin liquids Temperature Spikes Noted: Yes(99.6) Respiratory Status: Room air History of Recent Intubation: No Behavior/Cognition: Alert;Cooperative;Pleasant mood Oral Cavity Assessment: Within Functional Limits Oral Care Completed by SLP: No Oral Cavity - Dentition: Adequate natural dentition Vision: Functional for self-feeding Self-Feeding  Abilities: Able to feed self Patient Positioning: Upright in bed Baseline Vocal Quality: Normal Volitional Cough:  Strong Volitional Swallow: Able to elicit    Oral/Motor/Sensory Function Overall Oral Motor/Sensory Function: Within functional limits   Ice Chips Ice chips: Not tested   Thin Liquid Thin Liquid: Within functional limits Presentation: Cup;Self Fed;Straw    Nectar Thick Nectar Thick Liquid: Not tested   Honey Thick Honey Thick Liquid: Not tested   Puree Puree: Within functional limits Presentation: Spoon;Self Fed   Solid   GO   Solid: Within functional limits Presentation: [place], [place], [place] Speech-Language Pathologist  Aliene Altes 01/20/2018,8:48 AM

## 2018-01-20 NOTE — Progress Notes (Signed)
Patient ID: Eileen Wilkerson, female   DOB: 01/02/1932, 84 y.o.   MRN: [id number]  PROGRESS NOTE    SHANTIA SANFORD  FMB:[id number] DOB: 24-Jun-1932 DOA: 01/18/2018 PCP: Briscoe Deutscher, DO   Brief Narrative:  84 year old female with history of RA on Plaquenil and prednisone, recurrent UTI, chronic diastolic CHF, hyperlipidemia, fibromyalgia presented with fever and vomiting with weakness.  Patient was admitted with sepsis secondary to UTI and hypoxia and started on intravenous antibiotics.   Assessment & Plan:   Principal Problem:   Sepsis due to urinary tract infection ([place]) Active Problems:   Rheumatoid arthritis ([place])   Long term current use of systemic steroids   Fibromyalgia   GERD (gastroesophageal reflux disease)   Hypokalemia   Physical deconditioning   Immunosuppressed status ([facility])   E. coli UTI   Hypoxia   Chronic diastolic CHF (congestive heart failure) ([place])   Acute hypoxia: -May be secondary to sepsis.  No clear-cut evidence of pneumonia on chest x-ray.  -Improving.  Oxygen being weaned off. -Incentive spirometry -Diet as per SLP recommendations  Sepsis due to E. coli UTI: Urine culture from PCP visit grew E. coli resistant only to fluoroquinolones. -Hemodynamically improving.  Antibiotic plan as below  Probable E. coli UTI -Continue cefepime.  Blood cultures negative so far.  Urine culture showed multiple species.  Chronic diastolic CHF: Echo June 7017 showed EF 79-39%, grade 1 diastolic dysfunction -Stable.  Continue Lasix.  Strict input and output.  Daily weights.  No signs of fluid overload  Hypokalemia:  - Replace and recheck in AM  Hypomagnesemia -Replace.  Repeat a.m. labs  Deconditioning/weakness: Found on floor without injury. Has been getting around less since husband passed in Oct 2018. Lives alone with aides.  - PT recommends nursing home placement.  Will consult social worker.  Patient is currently refusing nursing home placement.  Care  management consult  Fibromyalgia: Stable - Continue savella; tramadol prn.  Patient follow-up  Rheumatoid arthritis:  - Will hold plaquenil while hospitalized with sepsis - Continue prednisone. BP appears stable.  Outpatient follow-up  DVT prophylaxis: Lovenox  Code Status: Full  Family Communication:  None at bedside Disposition Plan:  Nursing home versus home with home care in the next 1-3 days   Consultants: None Procedures: None  Antimicrobials:   Cefepime from 01/18/2018 onwards Patient received 1 dose of aztreonam and vancomycin on 01/18/2018   Subjective: Patient seen and examined at bedside.  She feels slightly better than yesterday..  No overnight fever, nausea or vomiting. Objective: Vitals:   01/19/18 1212 01/19/18 1257 01/19/18 2007 01/20/18 0522  BP:  (!) 141/79 120/60 128/67  Pulse:  100 99 88  Resp:  20 20 18   Temp: 99.6 F (37.6 C) 99.3 F (37.4 C) (!) 97.5 F (36.4 C) 99.3 F (37.4 C)  TempSrc: Oral Oral Oral Oral  SpO2:  97% 96% 96%  Weight:      Height:        Intake/Output Summary (Last 24 hours) at 01/20/2018 1054 Last data filed at 01/20/2018 1020 Gross per 24 hour  Intake 598 ml  Output 1200 ml  Net -602 ml   Filed Weights   01/18/18 1511 01/18/18 2113 01/19/18 0529  Weight: 74.8 kg (165 lb) 78.3 kg (172 lb 9.9 oz) 79.9 kg (176 lb 2.4 oz)    Examination:  General exam: Elderly female lying in bed no acute distress.  Awake and answering questions Respiratory system: Bilateral decreased breath sound at bases Cardiovascular system:  S1 & S2 heard, rate controlled  gastrointestinal system: Abdomen is nondistended, soft and nontender. Normal bowel sounds heard. Extremities: No cyanosis, clubbing, edema    Data Reviewed: I have personally reviewed following labs and imaging studies  CBC: Recent Labs  Lab 01/18/18 1358 01/20/18 0327  WBC 10.2 11.7*  NEUTROABS  --  7.9*  HGB 13.1 11.9*  HCT 39.2 37.0  MCV 98.7 100.3*  PLT 282 248     Basic Metabolic Panel: Recent Labs  Lab 01/18/18 1358 01/19/18 0342 01/20/18 0327  NA 138 142 138  K 2.8* 3.2* 3.0*  CL 96* 107 102  CO2 29 26 27   GLUCOSE 110* 109* 100*  BUN 21* 16 16  CREATININE 1.09* 0.93 0.96  CALCIUM 10.2 8.9 9.3  MG  --  1.7 1.6*   GFR: Estimated Creatinine Clearance: 44.4 mL/min (by C-G formula based on SCr of 0.96 mg/dL). Liver Function Tests: Recent Labs  Lab 01/20/18 0327  AST 34  ALT 24  ALKPHOS 38  BILITOT 0.7  PROT 6.6  ALBUMIN 3.0*   No results for input(s): LIPASE, AMYLASE in the last 168 hours. No results for input(s): AMMONIA in the last 168 hours. Coagulation Profile: No results for input(s): INR, PROTIME in the last 168 hours. Cardiac Enzymes: No results for input(s): CKTOTAL, CKMB, CKMBINDEX, TROPONINI in the last 168 hours. BNP (last 3 results) No results for input(s): PROBNP in the last 8760 hours. HbA1C: No results for input(s): HGBA1C in the last 72 hours. CBG: No results for input(s): GLUCAP in the last 168 hours. Lipid Profile: No results for input(s): CHOL, HDL, LDLCALC, TRIG, CHOLHDL, LDLDIRECT in the last 72 hours. Thyroid Function Tests: No results for input(s): TSH, T4TOTAL, FREET4, T3FREE, THYROIDAB in the last 72 hours. Anemia Panel: No results for input(s): VITAMINB12, FOLATE, FERRITIN, TIBC, IRON, RETICCTPCT in the last 72 hours. Sepsis Labs: Recent Labs  Lab 01/18/18 1407  LATICACIDVEN 1.64    Recent Results (from the past 240 hour(s))  Urine Culture     Status: Abnormal   Collection Time: 01/14/18  2:43 PM  Result Value Ref Range Status   MICRO NUMBER: [id number]  Final   SPECIMEN QUALITY: ADEQUATE  Final   Sample Source URINE  Final   STATUS: FINAL  Final   ISOLATE 1: Escherichia coli (A)  Final      Susceptibility   Escherichia coli - URINE CULTURE, REFLEX    AMOX/CLAVULANIC 4 Sensitive     AMPICILLIN 8 Sensitive     AMPICILLIN/SULBACTAM 4 Sensitive     CEFAZOLIN* <=4 Not Reportable      *  For infections other than uncomplicated UTIcaused by E. coli, K. pneumoniae or P. mirabilis:Cefazolin is resistant if MIC > or = 8 mcg/mL.(Distinguishing susceptible versus intermediatefor isolates with MIC < or = 4 mcg/mL requiresadditional testing.)For uncomplicated UTI caused by E. coli,K. pneumoniae or P. mirabilis: Cefazolin issusceptible if MIC <32 mcg/mL and predictssusceptible to the oral agents cefaclor, cefdinir,cefpodoxime, cefprozil, cefuroxime, cephalexinand loracarbef.    CEFEPIME <=1 Sensitive     CEFTRIAXONE <=1 Sensitive     CIPROFLOXACIN >=4 Resistant     LEVOFLOXACIN >=8 Resistant     ERTAPENEM <=0.5 Sensitive     GENTAMICIN <=1 Sensitive     IMIPENEM <=0.25 Sensitive     NITROFURANTOIN 32 Sensitive     PIP/TAZO <=4 Sensitive     TOBRAMYCIN <=1 Sensitive     TRIMETH/SULFA* <=20 Sensitive      * For infections other than uncomplicated  UTIcaused by E. coli, K. pneumoniae or P. mirabilis:Cefazolin is resistant if MIC > or = 8 mcg/mL.(Distinguishing susceptible versus intermediatefor isolates with MIC < or = 4 mcg/mL requiresadditional testing.)For uncomplicated UTI caused by E. coli,K. pneumoniae or P. mirabilis: Cefazolin issusceptible if MIC <32 mcg/mL and predictssusceptible to the oral agents cefaclor, cefdinir,cefpodoxime, cefprozil, cefuroxime, cephalexinand loracarbef.Legend:S = Susceptible  I = IntermediateR = Resistant  NS = Not susceptible* = Not tested  NR = Not reported**NN = See antimicrobic comments  Blood Culture (routine x 2)     Status: None (Preliminary result)   Collection Time: 01/18/18  2:00 PM  Result Value Ref Range Status   Specimen Description   Final    BLOOD LEFT ANTECUBITAL Performed at [facility] [address], [place], [place] [address]    Special Requests   Final    BOTTLES DRAWN AEROBIC AND ANAEROBIC Blood Culture adequate volume Performed at [facility] [address], [place], [place] [address]     Culture   Final    NO GROWTH 2 DAYS Performed at [facility] [address], [place], [place] [address]    Report Status PENDING  Incomplete  Blood Culture (routine x 2)     Status: None (Preliminary result)   Collection Time: 01/18/18  2:27 PM  Result Value Ref Range Status   Specimen Description   Final    BLOOD LEFT FOREARM Performed at [facility] [address], [place], [place] [address]    Special Requests   Final    BOTTLES DRAWN AEROBIC AND ANAEROBIC Blood Culture adequate volume Performed at [facility] [address], [place], [place] [address]    Culture   Final    NO GROWTH 2 DAYS Performed at [facility] [address], [place], [place] [address]    Report Status PENDING  Incomplete  Urine culture     Status: Abnormal   Collection Time: 01/18/18  3:30 PM  Result Value Ref Range Status   Specimen Description   Final    URINE, CATHETERIZED Performed at [facility] [address] [place], [place] [address]    Special Requests   Final    NONE Performed at [facility], [place] [address] [place], [place] [address]    Culture MULTIPLE SPECIES PRESENT, SUGGEST RECOLLECTION (A)  Final   Report Status 01/19/2018 FINAL  Final         Radiology Studies: Dg Chest 2 View  Result Date: 01/18/2018 CLINICAL DATA:  Shortness of breath, productive cough. EXAM: CHEST  2 VIEW COMPARISON:  Radiographs of July 11, 2017. FINDINGS: The heart size and mediastinal contours are within normal limits. No pneumothorax or pleural effusion is noted. Right lung is clear. Elevated left hemidiaphragm is noted with mild left basilar subsegmental atelectasis or scarring. The visualized skeletal structures are unremarkable. IMPRESSION: Elevated left hemidiaphragm with mild left basilar subsegmental atelectasis or scarring. Electronically Signed   By: Marijo Conception, M.D.   On: 01/18/2018 11:50   Dg  Chest Port 1 View  Result Date: 01/19/2018 CLINICAL DATA:  84 y/o  F; acute respiratory failure with hypoxia. EXAM: PORTABLE CHEST 1 VIEW COMPARISON:  01/18/2018 chest radiograph FINDINGS: Normal cardiac silhouette. Aortic atherosclerosis with calcification. Elevated left hemidiaphragm with stable chronic scarring/atelectasis at left lung base. No new consolidation, effusion, or pneumothorax. Anterior cervical discectomy and fusion hardware. Multilevel degenerative changes of the thoracic spine.  No pleural effusion or pneumothorax. No acute osseous abnormality is evident. IMPRESSION: Stable chronic scarring/atelectasis at left lung base and elevated left hemidiaphragm. No acute pulmonary process identified. Electronically Signed   By: Kristine Garbe M.D.   On: 01/19/2018 05:18        Scheduled Meds: . atorvastatin  20 mg Oral q1800  . enoxaparin (LOVENOX) injection  40 mg Subcutaneous Q24H  . fluticasone  2 spray Each Nare Daily  . furosemide  80 mg Oral Daily  . mouth rinse  15 mL Mouth Rinse BID  . Milnacipran  50 mg Oral BID  . potassium chloride  40 mEq Oral Q4H  . predniSONE  7 mg Oral Q breakfast  . sodium chloride flush  3 mL Intravenous Q12H   Continuous Infusions: . sodium chloride    . ceFEPime (MAXIPIME) IV Stopped (01/19/18 2150)     LOS: 2 days        Aline August, MD [facility] Pager [phone]  If 7PM-7AM, please contact night-coverage [email or web address] Password [facility] 01/20/2018, 10:54 AM

## 2018-01-21 LAB — CBC WITH DIFFERENTIAL/PLATELET
Basophils Absolute: 0.1 10*3/uL (ref 0.0–0.1)
Basophils Relative: 1 %
Eosinophils Absolute: 0.4 10*3/uL (ref 0.0–0.7)
Eosinophils Relative: 4 %
HCT: 34.8 % — ABNORMAL LOW (ref 36.0–46.0)
Hemoglobin: 11.4 g/dL — ABNORMAL LOW (ref 12.0–15.0)
Lymphocytes Relative: 26 %
Lymphs Abs: 2.6 10*3/uL (ref 0.7–4.0)
MCH: 32.9 pg (ref 26.0–34.0)
MCHC: 32.8 g/dL (ref 30.0–36.0)
MCV: 100.3 fL — ABNORMAL HIGH (ref 78.0–100.0)
Monocytes Absolute: 1.1 10*3/uL — ABNORMAL HIGH (ref 0.1–1.0)
Monocytes Relative: 11 %
Neutro Abs: 6 10*3/uL (ref 1.7–7.7)
Neutrophils Relative %: 58 %
Platelets: 265 10*3/uL (ref 150–400)
RBC: 3.47 MIL/uL — ABNORMAL LOW (ref 3.87–5.11)
RDW: 15.7 % — ABNORMAL HIGH (ref 11.5–15.5)
WBC: 10.1 10*3/uL (ref 4.0–10.5)

## 2018-01-21 LAB — BASIC METABOLIC PANEL
Anion gap: 11 (ref 5–15)
BUN: 20 mg/dL (ref 6–20)
CO2: 23 mmol/L (ref 22–32)
Calcium: 8.9 mg/dL (ref 8.9–10.3)
Chloride: 100 mmol/L — ABNORMAL LOW (ref 101–111)
Creatinine, Ser: 0.89 mg/dL (ref 0.44–1.00)
GFR calc Af Amer: >60 mL/min (ref >60)
GFR calc non Af Amer: 58 mL/min — ABNORMAL LOW (ref >60)
Glucose, Bld: 95 mg/dL (ref 65–99)
Potassium: 3.3 mmol/L — ABNORMAL LOW (ref 3.5–5.1)
Sodium: 134 mmol/L — ABNORMAL LOW (ref 135–145)

## 2018-01-21 LAB — MAGNESIUM: Magnesium: 2 mg/dL (ref 1.7–2.4)

## 2018-01-21 MED ORDER — CEFDINIR 300 MG PO CAPS: 300.0000 mg | ORAL_CAPSULE | Freq: Two times a day (BID) | ORAL | 0 refills | Status: DC

## 2018-01-21 MED ORDER — SODIUM CHLORIDE 0.9 % IV SOLN
1.0000 g | INTRAVENOUS | Status: DC
  Administered 2018-01-21: 1 g via INTRAVENOUS
  Filled 2018-01-21: qty 10

## 2018-01-21 MED ORDER — SODIUM CHLORIDE 0.9 % IV SOLN: 1.0000 g | INTRAVENOUS | Status: DC

## 2018-01-21 MED ORDER — POTASSIUM CHLORIDE CRYS ER 20 MEQ PO TBCR
40.0000 meq | EXTENDED_RELEASE_TABLET | Freq: Once | ORAL | Status: AC
  Administered 2018-01-21: 40 meq via ORAL
  Filled 2018-01-21: qty 2

## 2018-01-21 NOTE — Progress Notes (Signed)
Physical Therapy Treatment Patient Details Name: Eileen Wilkerson MRN: [id number] DOB: 12/26/1931 Today's Date: 01/21/2018    History of Present Illness Pt admitted with generalized weakness and sepsit 2* UTI.  Pt with hx of RA, fibromyalgia, osteoporosis, TSR and cervical fusion    PT Comments    Assisted OOB required increased time and effort.  Assisted with amb a limited distance due to weakness and fatigue.    Follow Up Recommendations  SNF     Equipment Recommendations  None recommended by PT    Recommendations for Other Services       Precautions / Restrictions Precautions Precautions: Fall Restrictions Weight Bearing Restrictions: No    Mobility  Bed Mobility Overal bed mobility: Needs Assistance Bed Mobility: Supine to Sit     Supine to sit: Mod assist     General bed mobility comments: assist with bil LEs and upper body using bed pad to complete scooting to EOB  Transfers Overall transfer level: Needs assistance Equipment used: Rolling walker (2 wheeled) Transfers: Sit to/from Stand Sit to Stand: Min assist;Mod assist         General transfer comment: increased time and assist to power up  Ambulation/Gait Ambulation/Gait assistance: Min assist Ambulation Distance (Feet): 22 Feet Assistive device: Rolling walker (2 wheeled) Gait Pattern/deviations: Step-to pattern;Step-through pattern;Decreased step length - right;Decreased step length - left;Shuffle;Trunk flexed Gait velocity: decr   General Gait Details: very unsteady gait with limited distance due to weakness/fatigue   Stairs            Wheelchair Mobility    Modified Rankin (Stroke Patients Only)       Balance                                            Cognition Arousal/Alertness: Awake/alert Behavior During Therapy: WFL for tasks assessed/performed Overall Cognitive Status: Within Functional Limits for tasks assessed                                        Exercises      General Comments        Pertinent Vitals/Pain Pain Assessment: No/denies pain    Home Living                      Prior Function            PT Goals (current goals can now be found in the care plan section) Progress towards PT goals: Progressing toward goals    Frequency    Min 3X/week      PT Plan Current plan remains appropriate    Co-evaluation              AM-PAC PT "6 Clicks" Daily Activity  Outcome Measure  Difficulty turning over in bed (including adjusting bedclothes, sheets and blankets)?: A Lot Difficulty moving from lying on back to sitting on the side of the bed? : Unable Difficulty sitting down on and standing up from a chair with arms (e.g., wheelchair, bedside commode, etc,.)?: Unable Help needed moving to and from a bed to chair (including a wheelchair)?: A Lot Help needed walking in hospital room?: A Little Help needed climbing 3-5 steps with a railing? : A Lot 6 Click Score: 11    End  of Session Equipment Utilized During Treatment: Gait belt Activity Tolerance: Patient limited by fatigue Patient left: in chair;with chair alarm set;with family/visitor present Nurse Communication: Mobility status PT Visit Diagnosis: Unsteadiness on feet (R26.81);Muscle weakness (generalized) (M62.81);Difficulty in walking, not elsewhere classified (R26.2)     Time: 1025-1050 PT Time Calculation (min) (ACUTE ONLY): 25 min  Charges:  $Gait Training: 8-22 mins $Therapeutic Activity: 8-22 mins                    G Codes:       {Ashlan Dignan  PTA [facility]  Acute  Rehab Pager      [phone]

## 2018-01-21 NOTE — Progress Notes (Signed)
Pharmacy Antibiotic Note  Retina Eileen Wilkerson is a 84 y.o. female admitted on 01/18/2018 with UTI.  Pharmacy has been consulted for cefepime dosing. Urine culture from 2/4 (outpatient) reveals quinolone resistant E. Coli (susc to other abx tested) 01/21/2018 ABX D#4, AF, WBC WNL.  Plan:  Cefepime 1 then 1gm IV q24h  Follow renal function  Since tolerated cefepime, suspect would tolerate ceftriaxone.  Consider narrowing antibiotic  No allergy to cephalexin, intolerance of N/V  Consider ceftriaxone or PO ceftin, vantin or omnicef  Height: 5\' 3"  (160 cm) Weight: 172 lb 9.9 oz (78.3 kg) IBW/kg (Calculated) : 52.4  Temp (24hrs), Avg:98.5 F (36.9 C), Min:98.2 F (36.8 C), Max:98.6 F (37 C)  Recent Labs  Lab 01/18/18 1358 01/18/18 1407 01/19/18 0342 01/20/18 0327 01/21/18 0344  WBC 10.2  --   --  11.7* 10.1  CREATININE 1.09*  --  0.93 0.96 0.89  LATICACIDVEN  --  1.64  --   --   --     Estimated Creatinine Clearance: 47.5 mL/min (by C-G formula based on SCr of 0.89 mg/dL).    Allergies  Allergen Reactions  . Adhesive [Tape] Rash  . Celebrex [Celecoxib] Hives  . Ciprofibrate Nausea Only  . Cymbalta [Duloxetine Hcl] Swelling  . Gabitril [Tiagabine] Swelling  . Keflex [Cephalexin] Nausea And Vomiting  . Lyrica [Pregabalin] Swelling  . Neurontin [Gabapentin] Swelling  . Nexium [Esomeprazole] Rash  . Nsaids Rash  . Penicillins Rash    Injection site reaction. Tolerated cefepime in past Has patient had a PCN reaction causing immediate rash, facial/tongue/throat swelling, SOB or lightheadedness with hypotension: No Has patient had a PCN reaction causing severe rash involving mucus membranes or skin necrosis: No Has patient had a PCN reaction that required hospitalization No Has patient had a PCN reaction occurring within the last 10 years: No If all of the above answers are "NO", then may proceed with Cephalosporin use.   . Shrimp [Shellfish Allergy] Anaphylaxis    Per  patient "shrimp only"  . Sulfa Antibiotics Nausea And Vomiting  . Ciprofloxacin Other (See Comments)    dizziness  . Claritin-D 12 Hour [Loratadine-Pseudoephedrine Er] Anxiety    Antimicrobials this admission: 2/8 vanco/aztreonam x 1 2/8 cefepime >>  Dose adjustments this admission:  Microbiology results: 2/8 UCx> mult species present, suggest re-collect 2/8 BCx: NGTD  2/4 UCx (outpatient): E. Coli (R to levoflox, cipro,  susc to amp, amox/clav, ceftriaxone, cefepime, tmp/smz, NTF)  Thank you for allowing pharmacy to be a part of this patient's care.  Eudelia Bunch, Pharm.D. [phone] 01/21/2018 1:04 PM

## 2018-01-21 NOTE — Discharge Summary (Addendum)
Physician Discharge Summary  Eileen Wilkerson NLZ:[id number] DOB: Jun 29, 1932 DOA: 01/18/2018  PCP: Briscoe Deutscher, DO  Admit date: 01/18/2018 Discharge date: 01/21/2018  Admitted From: Home Disposition:  Home with [place] [facility]:Yes  Discharge Condition:Stable CODE STATUS:Full Diet recommendation: Heart Healthy  Brief/Interim Summary: 84 year old female with history of RA on Plaquenil and prednisone, recurrent UTI, chronic diastolic CHF, hyperlipidemia, fibromyalgia presented with fever and vomiting with weakness.  Patient was admitted with sepsis secondary to UTI and hypoxia and started on intravenous antibiotics. Patient has history of recurrent UTIs in the past.  She was initially started on cefepime.  Her urine culture showed mixed organism.  Patient currently is not septic.  She is afebrile.  Her white cell counts are stable. She was also noted to be hypoxic on presentation likely secondary to sepsis.  Currently her respiratory status stable and she is saturating fine on room air.  Patient was evaluated by physical therapy and recommended to be discharged to skilled nursing facility  but she denied so home health was arranged . Patient is medically stable to be discharged today to home with home health.  Following problems were addressed during hospitalization:   Acute hypoxia: -May be secondary to sepsis.  No clear-cut evidence of pneumonia on chest x-ray.  -Resolved.  Currently saturating fine on room air.  Sepsis due  UTI: Urine culture from PCP visit grew E. coli resistant only to fluoroquinolones. Hemodynamically stable.  Antibiotic plan as below  Probable E. coli UTI: Was started on  cefepime.  Blood cultures negative so far.  Urine culture showed multiple species.  Antibiotics changed to ceftriaxone today and was deescalated to [facility] on discharge.  Chronic diastolic CHF:  -Echo June 2017 showed EF 90-24%, grade 1 diastolic dysfunction -Stable.  Continue  Lasix.  No signs of fluid overload  Hypokalemia:  - Replaced .  Continue potassium supplementation at home.Check potassium level in a week .  hypomagnesemia -Replace and corrected  Deconditioning/weakness:  Found on floor without injury. Has been getting around less since husband passed in Oct 2018. Lives alone with aides.  - PT recommends nursing home placement.  Social worker consulted.  Patient is currently refusing nursing home placement.  Care management consulted and arranged home health  Fibromyalgia:  -Stable - Continue savella; tramadol prn.    Rheumatoid arthritis:  -Continue home medications - Outpatient follow-up     Discharge Diagnoses:  Principal Problem:   Sepsis due to urinary tract infection ([place]) Active Problems:   Rheumatoid arthritis ([place])   Long term current use of systemic steroids   Fibromyalgia   GERD (gastroesophageal reflux disease)   Hypokalemia   Physical deconditioning   Immunosuppressed status ([facility])   E. coli UTI   Hypoxia   Chronic diastolic CHF (congestive heart failure) ([place])    Discharge Instructions  Discharge Instructions    Diet - low sodium heart healthy   Complete by:  As directed    Discharge instructions   Complete by:  As directed    1) Follow up with your PMD within a week. 2) Take prescribed medications as instructed. 3) Do a BMP test to check your kidney function in a week when he visit your PCP.   Increase activity slowly   Complete by:  As directed      Allergies as of 01/21/2018      Reactions   Adhesive [tape] Rash   Celebrex [celecoxib] Hives   Ciprofibrate Nausea Only   Cymbalta [duloxetine Hcl] Swelling  Gabitril [tiagabine] Swelling   Keflex [cephalexin] Nausea And Vomiting   Lyrica [pregabalin] Swelling   Neurontin [gabapentin] Swelling   Nexium [esomeprazole] Rash   Nsaids Rash   Penicillins Rash   Injection site reaction. Tolerated cefepime in past Has patient had a PCN reaction causing  immediate rash, facial/tongue/throat swelling, SOB or lightheadedness with hypotension: No Has patient had a PCN reaction causing severe rash involving mucus membranes or skin necrosis: No Has patient had a PCN reaction that required hospitalization No Has patient had a PCN reaction occurring within the last 10 years: No If all of the above answers are "NO", then may proceed with Cephalosporin use.   Shrimp [shellfish Allergy] Anaphylaxis   Per patient "shrimp only"   Sulfa Antibiotics Nausea And Vomiting   Ciprofloxacin Other (See Comments)   dizziness   Claritin-d 12 Hour [loratadine-pseudoephedrine Er] Anxiety      Medication List    TAKE these medications   acetaminophen 650 MG CR tablet Commonly known as:  TYLENOL Take 500 mg by mouth 3 (three) times daily.   atorvastatin 20 MG tablet Commonly known as:  LIPITOR TAKE 1 TABLET BY MOUTH ONCE DAILY AT BEDTIME AS NEEDED   BIOTIN MAXIMUM STRENGTH 10 MG Tabs Generic drug:  Biotin Take 10 mg by mouth daily.   cefdinir 300 MG capsule Commonly known as:  OMNICEF Take 1 capsule (300 mg total) by mouth 2 (two) times daily.   CRANBERRY PO Take by mouth daily.   esomeprazole 20 MG packet Commonly known as:  NEXIUM Take 20 mg by mouth 2 (two) times daily.   fluticasone 50 MCG/ACT nasal spray Commonly known as:  FLONASE Place 2 sprays into both nostrils daily.   folic acid 1 MG tablet Commonly known as:  FOLVITE Take 1 mg by mouth daily.   furosemide 80 MG tablet Commonly known as:  LASIX Take 1 tablet (80 mg total) by mouth daily.   hydroxychloroquine 200 MG tablet Commonly known as:  PLAQUENIL TAKE 1 TABLET BY MOUTH ONCE DAILY   hydroxychloroquine 200 MG tablet Commonly known as:  PLAQUENIL TAKE 1 TABLET BY MOUTH ONCE DAILY   ketoconazole 2 % cream Commonly known as:  NIZORAL Apply 1 application topically daily.   methocarbamol 500 MG tablet Commonly known as:  ROBAXIN TAKE 1 TABLET BY MOUTH EVERY 6 TO 8 HOURS  AS NEEDED FOR PAIN   OTREXUP 15 MG/0.4ML [place] Generic drug:  Methotrexate (PF) INJECT ONE PEN SUBCUTANEOUSLY ONCE EVERY WEEK. STORE AT ROOM TEMPERATURE BETWEEN 68 - 77 DEGREES F.   potassium chloride SA 20 MEQ tablet Commonly known as:  K-DUR,KLOR-CON Take 1 tablet (20 mEq total) by mouth daily.   predniSONE 1 MG tablet Commonly known as:  DELTASONE TAKE TWO TABLETS BY MOUTH ONCE DAILY   predniSONE 5 MG tablet Commonly known as:  DELTASONE TAKE 1 TABLET BY MOUTH ONCE DAILY WITH BREAKFAST   SAVELLA 50 MG Tabs tablet Generic drug:  Milnacipran TAKE 1 TABLET BY MOUTH TWICE DAILY   traMADol 50 MG tablet Commonly known as:  ULTRAM TAKE 1 TABLET BY MOUTH TWICE DAILY AS NEEDED   Vitamin D3 5000 units Caps Take 5,000 Units by mouth daily.      Follow-up Information    Briscoe Deutscher, DO. Schedule an appointment as soon as possible for a visit in 1 week(s).   Specialty:  Family Medicine Contact information: [place] [address] [phone]          Allergies  Allergen Reactions  . Adhesive [Tape] Rash  . Celebrex [Celecoxib] Hives  . Ciprofibrate Nausea Only  . Cymbalta [Duloxetine Hcl] Swelling  . Gabitril [Tiagabine] Swelling  . Keflex [Cephalexin] Nausea And Vomiting  . Lyrica [Pregabalin] Swelling  . Neurontin [Gabapentin] Swelling  . Nexium [Esomeprazole] Rash  . Nsaids Rash  . Penicillins Rash    Injection site reaction. Tolerated cefepime in past Has patient had a PCN reaction causing immediate rash, facial/tongue/throat swelling, SOB or lightheadedness with hypotension: No Has patient had a PCN reaction causing severe rash involving mucus membranes or skin necrosis: No Has patient had a PCN reaction that required hospitalization No Has patient had a PCN reaction occurring within the last 10 years: No If all of the above answers are "NO", then may proceed with Cephalosporin use.   . Shrimp [Shellfish Allergy] Anaphylaxis    Per  patient "shrimp only"  . Sulfa Antibiotics Nausea And Vomiting  . Ciprofloxacin Other (See Comments)    dizziness  . Claritin-D 12 Hour [Loratadine-Pseudoephedrine Er] Anxiety    Consultations: None  Procedures/Studies: Dg Chest 2 View  Result Date: 01/18/2018 CLINICAL DATA:  Shortness of breath, productive cough. EXAM: CHEST  2 VIEW COMPARISON:  Radiographs of July 11, 2017. FINDINGS: The heart size and mediastinal contours are within normal limits. No pneumothorax or pleural effusion is noted. Right lung is clear. Elevated left hemidiaphragm is noted with mild left basilar subsegmental atelectasis or scarring. The visualized skeletal structures are unremarkable. IMPRESSION: Elevated left hemidiaphragm with mild left basilar subsegmental atelectasis or scarring. Electronically Signed   By: Marijo Conception, M.D.   On: 01/18/2018 11:50   Dg Bone Density  Result Date: 12/27/2017 EXAM: DUAL X-RAY ABSORPTIOMETRY (DXA) FOR BONE MINERAL DENSITY IMPRESSION: Referring Physician:  Briscoe Deutscher PATIENT: Name: Eileen Wilkerson, Eileen Wilkerson Patient ID: [id number] Birth Date: December 04, 1932 Height: 63.2 in. Sex: Female Measured: 12/27/2017 Weight: 168.0 lbs. Indications: Adrenal Insufficiency, Advanced Age, Caucasian, Estrogen Deficient, Glucocorticoids (Chronic) (255.41), Height Loss (781.91), High risk medication use, History of Fracture (Adult) (V15.51), History of Osteoporosis, Hx of tobacco use, Low Calcium Intake (269.3), Nexium, Postmenopausal, Rheumatoid Arthritis (714.0), Secondary Osteoporosis Fractures: Foot Treatments: Vitamin D (E933.5) ASSESSMENT: The BMD measured at Femur Neck Left is 0.807 g/cm2 with a T-score of -1.7. This patient is considered osteopenic according to [facility]) criteria. Lumbar spine was not utilized due to multiple surgeries and advanced degenerative changes. Patient is not eligible for FRAX due to Reclast given within the last two years. Site Region Measured Date  Measured Age YA BMD Significant CHANGE T-score DualFemur Neck Left 12/27/2017 83.3 -1.7 0.807 g/cm2 Left Forearm Radius 33% 12/27/2017 83.3 2.0 0.887 g/cm2 World Health Organization [facility]) criteria for post-menopausal, Caucasian Women: Normal       T-score at or above -1 SD Osteopenia   T-score between -1 and -2.5 SD Osteoporosis T-score at or below -2.5 SD RECOMMENDATION: [place] recommends that FDA-approved medical therapies be considered in postmenopausal women and men age 41 or older with a: 1. Hip or vertebral (clinical or morphometric) fracture. 2. T-score of <-2.5 at the spine or hip. 3. Ten-year fracture probability by FRAX of 3% or greater for hip fracture or 20% or greater for major osteoporotic fracture. All treatment decisions require clinical judgment and consideration of individual patient factors, including patient preferences, co-morbidities, previous drug use, risk factors not captured in the FRAX model (e.g. falls, vitamin D deficiency, increased bone turnover, interval significant decline in bone density) and possible under -  or over-estimation of fracture risk by FRAX. All patients should ensure an adequate intake of dietary calcium (1200 mg/d) and vitamin D (800 IU daily) unless contraindicated. FOLLOW-UP: People with diagnosed cases of osteoporosis or at high risk for fracture should have regular bone mineral density tests. For patients eligible for Medicare, routine testing is allowed once every 2 years. The testing frequency can be increased to one year for patients who have rapidly progressing disease, those who are receiving or discontinuing medical therapy to restore bone mass, or have additional risk factors. I have reviewed this report, and agree with the above findings. [facility] Electronically Signed   By: Earle Gell M.D.   On: 12/27/2017 12:43   Dg Chest Port 1 View  Result Date: 01/19/2018 CLINICAL DATA:  84 y/o  F; acute respiratory failure  with hypoxia. EXAM: PORTABLE CHEST 1 VIEW COMPARISON:  01/18/2018 chest radiograph FINDINGS: Normal cardiac silhouette. Aortic atherosclerosis with calcification. Elevated left hemidiaphragm with stable chronic scarring/atelectasis at left lung base. No new consolidation, effusion, or pneumothorax. Anterior cervical discectomy and fusion hardware. Multilevel degenerative changes of the thoracic spine. No pleural effusion or pneumothorax. No acute osseous abnormality is evident. IMPRESSION: Stable chronic scarring/atelectasis at left lung base and elevated left hemidiaphragm. No acute pulmonary process identified. Electronically Signed   By: Kristine Garbe M.D.   On: 01/19/2018 05:18   Mm Screening Breast Tomo Bilateral  Result Date: 12/27/2017 CLINICAL DATA:  Screening. EXAM: 2D DIGITAL SCREENING BILATERAL MAMMOGRAM WITH 3D TOMO WITH CAD COMPARISON:  Previous exam(s). ACR Breast Density Category b: There are scattered areas of fibroglandular density. FINDINGS: There are no findings suspicious for malignancy. Images were processed with CAD. IMPRESSION: No mammographic evidence of malignancy. A result letter of this screening mammogram will be mailed directly to the patient. RECOMMENDATION: Screening mammogram in one year. (Code:SM-B-01Y) BI-RADS CATEGORY  1: Negative. Electronically Signed   By: Lajean Manes M.D.   On: 12/27/2017 13:12       Subjective: Patient seen and examined the bedside this morning. Vitals  Stable.  Currently she is not on any respiratory distress.  Saturating fine on room air.  She does not have any urinary symptoms at present.Afebrile Patient was recommended skilled nursing facility but she denied so she is  being discharged to home with home health.  Discharge Exam: Vitals:   01/20/18 2330 01/21/18 0405  BP: 116/62 (!) 136/56  Pulse: 95 89  Resp: 16 18  Temp: 98.6 F (37 C) 98.6 F (37 C)  SpO2: (!) 86% 90%   Vitals:   01/20/18 1405 01/20/18 2330 01/21/18  0405 01/21/18 0424  BP: 105/70 116/62 (!) 136/56   Pulse: 100 95 89   Resp: 18 16 18    Temp: 98.2 F (36.8 C) 98.6 F (37 C) 98.6 F (37 C)   TempSrc: Oral Oral Oral   SpO2: 98% (!) 86% 90%   Weight: 80.3 kg (177 lb 0.5 oz)   78.3 kg (172 lb 9.9 oz)  Height:        General: Pt is alert, awake, not in acute distress Cardiovascular: RRR, S1/S2 +, no rubs, no gallops Respiratory: CTA bilaterally, no wheezing, no rhonchi Abdominal: Soft, NT, ND, bowel sounds + Extremities: no edema, no cyanosis    The results of significant diagnostics from this hospitalization (including imaging, microbiology, ancillary and laboratory) are listed below for reference.     Microbiology: Recent Results (from the past 240 hour(s))  Urine Culture     Status:  Abnormal   Collection Time: 01/14/18  2:43 PM  Result Value Ref Range Status   MICRO NUMBER: [id number]  Final   SPECIMEN QUALITY: ADEQUATE  Final   Sample Source URINE  Final   STATUS: FINAL  Final   ISOLATE 1: Escherichia coli (A)  Final      Susceptibility   Escherichia coli - URINE CULTURE, REFLEX    AMOX/CLAVULANIC 4 Sensitive     AMPICILLIN 8 Sensitive     AMPICILLIN/SULBACTAM 4 Sensitive     CEFAZOLIN* <=4 Not Reportable      * For infections other than uncomplicated UTIcaused by E. coli, K. pneumoniae or P. mirabilis:Cefazolin is resistant if MIC > or = 8 mcg/mL.(Distinguishing susceptible versus intermediatefor isolates with MIC < or = 4 mcg/mL requiresadditional testing.)For uncomplicated UTI caused by E. coli,K. pneumoniae or P. mirabilis: Cefazolin issusceptible if MIC <32 mcg/mL and predictssusceptible to the oral agents cefaclor, cefdinir,cefpodoxime, cefprozil, cefuroxime, cephalexinand loracarbef.    CEFEPIME <=1 Sensitive     CEFTRIAXONE <=1 Sensitive     CIPROFLOXACIN >=4 Resistant     LEVOFLOXACIN >=8 Resistant     ERTAPENEM <=0.5 Sensitive     GENTAMICIN <=1 Sensitive     IMIPENEM <=0.25 Sensitive     NITROFURANTOIN 32  Sensitive     PIP/TAZO <=4 Sensitive     TOBRAMYCIN <=1 Sensitive     TRIMETH/SULFA* <=20 Sensitive      * For infections other than uncomplicated UTIcaused by E. coli, K. pneumoniae or P. mirabilis:Cefazolin is resistant if MIC > or = 8 mcg/mL.(Distinguishing susceptible versus intermediatefor isolates with MIC < or = 4 mcg/mL requiresadditional testing.)For uncomplicated UTI caused by E. coli,K. pneumoniae or P. mirabilis: Cefazolin issusceptible if MIC <32 mcg/mL and predictssusceptible to the oral agents cefaclor, cefdinir,cefpodoxime, cefprozil, cefuroxime, cephalexinand loracarbef.Legend:S = Susceptible  I = IntermediateR = Resistant  NS = Not susceptible* = Not tested  NR = Not reported**NN = See antimicrobic comments  Blood Culture (routine x 2)     Status: None (Preliminary result)   Collection Time: 01/18/18  2:00 PM  Result Value Ref Range Status   Specimen Description   Final    BLOOD LEFT ANTECUBITAL Performed at [facility] [address], [place], [place] [address]    Special Requests   Final    BOTTLES DRAWN AEROBIC AND ANAEROBIC Blood Culture adequate volume Performed at [facility] [address] [place], [place] [address]    Culture   Final    NO GROWTH 3 DAYS Performed at [facility], [place] [address], [place], [place] [address]    Report Status PENDING  Incomplete  Blood Culture (routine x 2)     Status: None (Preliminary result)   Collection Time: 01/18/18  2:27 PM  Result Value Ref Range Status   Specimen Description   Final    BLOOD LEFT FOREARM Performed at [facility] [address] [place], [place] [address]    Special Requests   Final    BOTTLES DRAWN AEROBIC AND ANAEROBIC Blood Culture adequate volume Performed at [facility] [address], [place], [place] [address]    Culture   Final    NO GROWTH 3 DAYS Performed at [facility], [place] [address]  [place], [place] [address]    Report Status PENDING  Incomplete  Urine culture     Status: Abnormal   Collection Time: 01/18/18  3:30 PM  Result Value  Ref Range Status   Specimen Description   Final    URINE, CATHETERIZED Performed at [facility] [address] [place], [place] [address]    Special Requests   Final    NONE Performed at [facility], [place] [address] [place], [place] [address]    Culture MULTIPLE SPECIES PRESENT, SUGGEST RECOLLECTION (A)  Final   Report Status 01/19/2018 FINAL  Final     Labs: BNP (last 3 results) Recent Labs    05/17/17 1525  BNP 09.8   Basic Metabolic Panel: Recent Labs  Lab 01/18/18 1358 01/19/18 0342 01/20/18 0327 01/21/18 0344  NA 138 142 138 134*  K 2.8* 3.2* 3.0* 3.3*  CL 96* 107 102 100*  CO2 29 26 27 23   GLUCOSE 110* 109* 100* 95  BUN 21* 16 16 20   CREATININE 1.09* 0.93 0.96 0.89  CALCIUM 10.2 8.9 9.3 8.9  MG  --  1.7 1.6* 2.0   Liver Function Tests: Recent Labs  Lab 01/20/18 0327  AST 34  ALT 24  ALKPHOS 38  BILITOT 0.7  PROT 6.6  ALBUMIN 3.0*   No results for input(s): LIPASE, AMYLASE in the last 168 hours. No results for input(s): AMMONIA in the last 168 hours. CBC: Recent Labs  Lab 01/18/18 1358 01/20/18 0327 01/21/18 0344  WBC 10.2 11.7* 10.1  NEUTROABS  --  7.9* 6.0  HGB 13.1 11.9* 11.4*  HCT 39.2 37.0 34.8*  MCV 98.7 100.3* 100.3*  PLT 282 248 265   Cardiac Enzymes: No results for input(s): CKTOTAL, CKMB, CKMBINDEX, TROPONINI in the last 168 hours. BNP: Invalid input(s): POCBNP CBG: No results for input(s): GLUCAP in the last 168 hours. D-Dimer No results for input(s): DDIMER in the last 72 hours. Hgb A1c No results for input(s): HGBA1C in the last 72 hours. Lipid Profile No results for input(s): CHOL, HDL, LDLCALC, TRIG, CHOLHDL, LDLDIRECT in the last 72 hours. Thyroid function studies No results for input(s): TSH, T4TOTAL, T3FREE, THYROIDAB in the last  72 hours.  Invalid input(s): FREET3 Anemia work up No results for input(s): VITAMINB12, FOLATE, FERRITIN, TIBC, IRON, RETICCTPCT in the last 72 hours. Urinalysis    Component Value Date/Time   COLORURINE YELLOW 01/18/2018 1530   APPEARANCEUR CLOUDY (A) 01/18/2018 1530   LABSPEC 1.014 01/18/2018 1530   PHURINE 6.0 01/18/2018 1530   GLUCOSEU NEGATIVE 01/18/2018 1530   HGBUR MODERATE (A) 01/18/2018 1530   BILIRUBINUR NEGATIVE 01/18/2018 1530   BILIRUBINUR Negative 01/14/2018 1454   KETONESUR NEGATIVE 01/18/2018 1530   PROTEINUR NEGATIVE 01/18/2018 1530   UROBILINOGEN 0.2 01/14/2018 1454   UROBILINOGEN 0.2 04/12/2010 1121   NITRITE NEGATIVE 01/18/2018 1530   LEUKOCYTESUR LARGE (A) 01/18/2018 1530   Sepsis Labs Invalid input(s): PROCALCITONIN,  WBC,  LACTICIDVEN Microbiology Recent Results (from the past 240 hour(s))  Urine Culture     Status: Abnormal   Collection Time: 01/14/18  2:43 PM  Result Value Ref Range Status   MICRO NUMBER: [id number]  Final   SPECIMEN QUALITY: ADEQUATE  Final   Sample Source URINE  Final   STATUS: FINAL  Final   ISOLATE 1: Escherichia coli (A)  Final      Susceptibility   Escherichia coli - URINE CULTURE, REFLEX    AMOX/CLAVULANIC 4 Sensitive     AMPICILLIN 8 Sensitive     AMPICILLIN/SULBACTAM 4 Sensitive     CEFAZOLIN* <=4 Not Reportable      * For infections other than uncomplicated UTIcaused by E. coli,  K. pneumoniae or P. mirabilis:Cefazolin is resistant if MIC > or = 8 mcg/mL.(Distinguishing susceptible versus intermediatefor isolates with MIC < or = 4 mcg/mL requiresadditional testing.)For uncomplicated UTI caused by E. coli,K. pneumoniae or P. mirabilis: Cefazolin issusceptible if MIC <32 mcg/mL and predictssusceptible to the oral agents cefaclor, cefdinir,cefpodoxime, cefprozil, cefuroxime, cephalexinand loracarbef.    CEFEPIME <=1 Sensitive     CEFTRIAXONE <=1 Sensitive     CIPROFLOXACIN >=4 Resistant     LEVOFLOXACIN >=8 Resistant      ERTAPENEM <=0.5 Sensitive     GENTAMICIN <=1 Sensitive     IMIPENEM <=0.25 Sensitive     NITROFURANTOIN 32 Sensitive     PIP/TAZO <=4 Sensitive     TOBRAMYCIN <=1 Sensitive     TRIMETH/SULFA* <=20 Sensitive      * For infections other than uncomplicated UTIcaused by E. coli, K. pneumoniae or P. mirabilis:Cefazolin is resistant if MIC > or = 8 mcg/mL.(Distinguishing susceptible versus intermediatefor isolates with MIC < or = 4 mcg/mL requiresadditional testing.)For uncomplicated UTI caused by E. coli,K. pneumoniae or P. mirabilis: Cefazolin issusceptible if MIC <32 mcg/mL and predictssusceptible to the oral agents cefaclor, cefdinir,cefpodoxime, cefprozil, cefuroxime, cephalexinand loracarbef.Legend:S = Susceptible  I = IntermediateR = Resistant  NS = Not susceptible* = Not tested  NR = Not reported**NN = See antimicrobic comments  Blood Culture (routine x 2)     Status: None (Preliminary result)   Collection Time: 01/18/18  2:00 PM  Result Value Ref Range Status   Specimen Description   Final    BLOOD LEFT ANTECUBITAL Performed at [facility] [address], [place], [place] [address]    Special Requests   Final    BOTTLES DRAWN AEROBIC AND ANAEROBIC Blood Culture adequate volume Performed at [facility] [address] [place], [place] [address]    Culture   Final    NO GROWTH 3 DAYS Performed at [facility], [place] [address] [place], [place] [address]    Report Status PENDING  Incomplete  Blood Culture (routine x 2)     Status: None (Preliminary result)   Collection Time: 01/18/18  2:27 PM  Result Value Ref Range Status   Specimen Description   Final    BLOOD LEFT FOREARM Performed at [facility] [address], [place], [place] [address]    Special Requests   Final    BOTTLES DRAWN AEROBIC AND ANAEROBIC Blood Culture adequate volume Performed at [facility] [address] [place],  [place] [address]    Culture   Final    NO GROWTH 3 DAYS Performed at [facility], [place] [address], [place], [place] [address]    Report Status PENDING  Incomplete  Urine culture     Status: Abnormal   Collection Time: 01/18/18  3:30 PM  Result Value Ref Range Status   Specimen Description   Final    URINE, CATHETERIZED Performed at [facility] [address], [place], [place] [address]    Special Requests   Final    NONE Performed at [facility], [place] [address] [place], [place] [address]    Culture MULTIPLE SPECIES PRESENT, SUGGEST RECOLLECTION (A)  Final   Report Status 01/19/2018 FINAL  Final     Time coordinating discharge: Over 30 minutes  SIGNED:   Marene Lenz, MD  [facility] 01/21/2018, 2:21 PM Pager [phone]  If 7PM-7AM, please contact night-coverage [email or web address] Password [email or web address]

## 2018-01-21 NOTE — Progress Notes (Signed)
Pts O2 sat 83% on RA upon awakening. Placed on 2L O2, sat increased to 95%. Continue to monitor. Hortencia Conradi RN

## 2018-01-21 NOTE — Progress Notes (Signed)
Pt to dc back home with caregivers and home health services. Pt offered choice and [facility] chosen. [facility] rep contacted for referral and MD asked for orders. Marney Doctor RN,BSN,NCM [phone]

## 2018-01-21 NOTE — Clinical Social Work Note (Signed)
Clinical Social Work Assessment  Patient Details  Name: Eileen Wilkerson MRN: [id number] Date of Birth: 1932/11/01  Date of referral:  01/21/18               Reason for consult:  Facility Placement, Discharge Planning                Permission sought to share information with:  Family Supports Permission granted to share information::  Yes, Verbal Permission Granted  Name::     daughter [name]  Agency::     Relationship::     Contact Information:     Housing/Transportation Living arrangements for the past 2 months:  Single Family Home Source of Information:  Patient, Adult Children Patient Interpreter Needed:  None Criminal Activity/Legal Involvement Pertinent to Current Situation/Hospitalization:  No - Comment as needed Significant Relationships:  Adult Children, Community Support, Friend Lives with:  Self Do you feel safe going back to the place where you live?  Yes Need for family participation in patient care:  No (Coment)(pt is own decision maker but appreciates daughter's input)  Care giving concerns:  Pt admitted from home where she resides alone. PTA was independent (sometimes used a walker and BC that she had from when her husband needed it in the past). Pt has lived alone since husband passed away Sep 30, 2017. Daughter lives nearby and is helpful and supportive per pt. Also has friends and neighbors for support.    Social Worker assessment / plan:  CSW consulted to assess SNF placement. Met with pt to discuss- daughter at bedside as well. Pt very reluctant to pursue SNF rehab as she states she feels her experiences with her husband being at SNFs in the past was not positive.  Both daughter and CSW expressed concern that pt will need additional support at home. Pt is wokring to schedule private caregivers to assist her, states she has relationships with several who assisted her with her husband's care. Daughter expressed concern also that pt may not progress well with PT  at home. Pt maintains she would like the chance to try as she feels that the mental/emotional benefit she will get from being at home is her main concern. CSW discussed home health services and pt agreed to referrals to PT/RN/SW.  Plan: pt returning to her home at DC with hiring caregivers and [facility] referrals. Daughter also looking into Life[employer] system to have in place in case of another fall.   Employment status:  Retired Nurse, adult PT Recommendations:  [place] / Referral to community resources:     Patient/Family's Response to care:  appreciative  Patient/Family's Understanding of and Emotional Response to Diagnosis, Current Treatment, and Prognosis:  Pt is anxious re the decisions she is making about her DC plans. However, she states she feels sure that her decision to go home is the best one for her needs right now. Demonstrates good understanding of her care needs and acknowledges the recommendations made and her decision to decline SNF. Daughter emotionally appropriate and demonstrates very good understanding of care as well. Raises appropriate questions to pt and CSW and demonstrates support for her mother's wishes  Emotional Assessment Appearance:  Appears younger than stated age Attitude/Demeanor/Rapport:  Engaged Affect (typically observed):  Anxious Orientation:  Oriented to Self, Oriented to Place, Oriented to  Time, Oriented to Situation Alcohol / Substance use:  Not Applicable Psych involvement (Current and /or in the community):  No (Comment)  Discharge Needs  Concerns  to be addressed:    Readmission within the last 30 days:  No Current discharge risk:  Dependent with Mobility Barriers to Discharge:  No Barriers Identified   Nila Nephew, LCSW 01/21/2018, 1:47 PM  [phone]

## 2018-01-22 ENCOUNTER — Telehealth: Payer: Self-pay | Admitting: *Deleted

## 2018-01-22 ENCOUNTER — Other Ambulatory Visit: Payer: Self-pay | Admitting: Rheumatology

## 2018-01-22 LAB — URINE CULTURE: Culture: NO GROWTH

## 2018-01-22 NOTE — Telephone Encounter (Signed)
Last Visit: 12/19/17 Next Visit: 04/04/18 UDS: 02/22/2017 Narc agreement: 02/22/2017  Okay to refill Tramadol?

## 2018-01-22 NOTE — Telephone Encounter (Signed)
Per Chart review:  Admit date: 01/18/2018 Discharge date: 01/21/2018  Admitted From: Home Disposition:  Home with [facility]:Yes  Discharge Condition:Stable CODE STATUS:Full Diet recommendation: Heart Healthy  _____________________________________________________________ Per telephone call: Transition Care Management Follow-up Telephone Call   Date discharged? 01/21/2018   How have you been since you were released from the hospital? "Fair"   Do you understand why you were in the hospital? yes   Do you understand the discharge instructions? yes   Where were you discharged to? Home   Items Reviewed:  Medications reviewed: yes  Allergies reviewed: yes  Dietary changes reviewed: yes  Referrals reviewed: yes   Functional Questionnaire:   Activities of Daily Living (ADLs):   She states they are independent in the following: Able to come ADLs with assistance States they require assistance with the following: Able to complete ADL's with assistance   Any transportation issues/concerns?: no   Any patient concerns? Yes. Patient states that her appetite has been better than usual. She states she has felt nauseous. Denies vomiting. I suggested eating crackers or toast with her antibiotics. She states that she is still feeling dizzy when she moves around. Same as it was in the hospital. She states that she is not concerned about this. I encouraged her to make sure she is drinking plenty of water.    Confirmed importance and date/time of follow-up visits scheduled yes  Provider Appointment booked with Dr Juleen China 01/28/18 10:40.  Confirmed with patient if condition begins to worsen call PCP or go to the ER.  Patient was given the office number and encouraged to call back with question or concerns.  : yes

## 2018-01-23 LAB — CULTURE, BLOOD (ROUTINE X 2)
Culture: NO GROWTH
Culture: NO GROWTH
Special Requests: ADEQUATE
Special Requests: ADEQUATE

## 2018-01-24 ENCOUNTER — Telehealth: Payer: Self-pay | Admitting: Family Medicine

## 2018-01-24 NOTE — Telephone Encounter (Signed)
Please advise 

## 2018-01-24 NOTE — Telephone Encounter (Unsigned)
Copied from [place]. Topic: Quick Communication - See Telephone Encounter >> Jan 24, 2018  4:37 PM Percell Belt A wrote: CRM for notification. See Telephone encounter for: Will from [facility] home [facility]  [phone] Need [place] OT  2 week 3   01/24/18.

## 2018-01-25 NOTE — Telephone Encounter (Signed)
Ok to give verbal 

## 2018-01-27 NOTE — Telephone Encounter (Signed)
Okay 

## 2018-01-28 ENCOUNTER — Inpatient Hospital Stay: Payer: Medicare Other | Admitting: Family Medicine

## 2018-01-28 NOTE — Telephone Encounter (Signed)
Called and spoke to Will verbal given will call if any questions.

## 2018-01-30 ENCOUNTER — Inpatient Hospital Stay: Payer: Medicare Other | Admitting: Family Medicine

## 2018-02-02 ENCOUNTER — Inpatient Hospital Stay (HOSPITAL_COMMUNITY)
Admission: EM | Admit: 2018-02-02 | Discharge: 2018-02-06 | DRG: 871 | Disposition: A | Discharge status: Home Health | Payer: Medicare Other | Attending: Internal Medicine | Admitting: Internal Medicine

## 2018-02-02 ENCOUNTER — Emergency Department (HOSPITAL_COMMUNITY): Payer: Medicare Other

## 2018-02-02 DIAGNOSIS — A419 Sepsis, unspecified organism: Secondary | ICD-10-CM | POA: Diagnosis not present

## 2018-02-02 DIAGNOSIS — N179 Acute kidney failure, unspecified: Secondary | ICD-10-CM | POA: Diagnosis present

## 2018-02-02 DIAGNOSIS — I5032 Chronic diastolic (congestive) heart failure: Secondary | ICD-10-CM | POA: Diagnosis present

## 2018-02-02 DIAGNOSIS — M810 Age-related osteoporosis without current pathological fracture: Secondary | ICD-10-CM | POA: Diagnosis present

## 2018-02-02 DIAGNOSIS — Z981 Arthrodesis status: Secondary | ICD-10-CM

## 2018-02-02 DIAGNOSIS — R1111 Vomiting without nausea: Secondary | ICD-10-CM

## 2018-02-02 DIAGNOSIS — R410 Disorientation, unspecified: Secondary | ICD-10-CM

## 2018-02-02 DIAGNOSIS — Z7951 Long term (current) use of inhaled steroids: Secondary | ICD-10-CM

## 2018-02-02 DIAGNOSIS — Z888 Allergy status to other drugs, medicaments and biological substances status: Secondary | ICD-10-CM

## 2018-02-02 DIAGNOSIS — E876 Hypokalemia: Secondary | ICD-10-CM | POA: Diagnosis present

## 2018-02-02 DIAGNOSIS — Z91013 Allergy to seafood: Secondary | ICD-10-CM

## 2018-02-02 DIAGNOSIS — Z9842 Cataract extraction status, left eye: Secondary | ICD-10-CM

## 2018-02-02 DIAGNOSIS — J189 Pneumonia, unspecified organism: Secondary | ICD-10-CM | POA: Diagnosis present

## 2018-02-02 DIAGNOSIS — Y95 Nosocomial condition: Secondary | ICD-10-CM | POA: Diagnosis present

## 2018-02-02 DIAGNOSIS — M797 Fibromyalgia: Secondary | ICD-10-CM | POA: Diagnosis present

## 2018-02-02 DIAGNOSIS — Z9841 Cataract extraction status, right eye: Secondary | ICD-10-CM

## 2018-02-02 DIAGNOSIS — Z87891 Personal history of nicotine dependence: Secondary | ICD-10-CM

## 2018-02-02 DIAGNOSIS — D539 Nutritional anemia, unspecified: Secondary | ICD-10-CM | POA: Diagnosis present

## 2018-02-02 DIAGNOSIS — Z882 Allergy status to sulfonamides status: Secondary | ICD-10-CM

## 2018-02-02 DIAGNOSIS — E861 Hypovolemia: Secondary | ICD-10-CM | POA: Diagnosis present

## 2018-02-02 DIAGNOSIS — Z79899 Other long term (current) drug therapy: Secondary | ICD-10-CM

## 2018-02-02 DIAGNOSIS — R112 Nausea with vomiting, unspecified: Secondary | ICD-10-CM | POA: Diagnosis present

## 2018-02-02 DIAGNOSIS — M069 Rheumatoid arthritis, unspecified: Secondary | ICD-10-CM | POA: Diagnosis present

## 2018-02-02 DIAGNOSIS — Z96619 Presence of unspecified artificial shoulder joint: Secondary | ICD-10-CM | POA: Diagnosis present

## 2018-02-02 DIAGNOSIS — Z880 Allergy status to penicillin: Secondary | ICD-10-CM

## 2018-02-02 DIAGNOSIS — Z881 Allergy status to other antibiotic agents status: Secondary | ICD-10-CM

## 2018-02-02 DIAGNOSIS — Z7952 Long term (current) use of systemic steroids: Secondary | ICD-10-CM

## 2018-02-02 DIAGNOSIS — R6520 Severe sepsis without septic shock: Secondary | ICD-10-CM | POA: Diagnosis present

## 2018-02-02 MED ORDER — SODIUM CHLORIDE 0.9 % IV BOLUS (SEPSIS)
1000.0000 mL | Freq: Once | INTRAVENOUS | Status: AC
  Administered 2018-02-03: 1000 mL via INTRAVENOUS

## 2018-02-02 MED ORDER — SODIUM CHLORIDE 0.9 % IV SOLN: 2.0000 g | Freq: Once | INTRAVENOUS | Status: DC

## 2018-02-02 MED ORDER — SODIUM CHLORIDE 0.9 % IV BOLUS (SEPSIS)
500.0000 mL | Freq: Once | INTRAVENOUS | Status: AC
  Administered 2018-02-03: 500 mL via INTRAVENOUS

## 2018-02-02 MED ORDER — VANCOMYCIN HCL IN DEXTROSE 1-5 GM/200ML-% IV SOLN: 1000.0000 mg | Freq: Once | INTRAVENOUS | Status: DC

## 2018-02-02 MED ORDER — LEVOFLOXACIN IN D5W 750 MG/150ML IV SOLN
750.0000 mg | Freq: Once | INTRAVENOUS | Status: AC
  Administered 2018-02-03: 750 mg via INTRAVENOUS
  Filled 2018-02-02: qty 150

## 2018-02-02 MED ORDER — VANCOMYCIN HCL 10 G IV SOLR
1500.0000 mg | Freq: Once | INTRAVENOUS | Status: AC
  Administered 2018-02-03: 1500 mg via INTRAVENOUS
  Filled 2018-02-02: qty 1000

## 2018-02-02 MED ORDER — AZTREONAM IN DEXTROSE 2 GM/50ML IV SOLN
2.0000 g | Freq: Once | INTRAVENOUS | Status: AC
Start: 2018-02-03 — End: 2018-02-03
  Administered 2018-02-03: 2 g via INTRAVENOUS
  Filled 2018-02-02: qty 50

## 2018-02-02 MED ORDER — ACETAMINOPHEN 650 MG RE SUPP
650.0000 mg | Freq: Once | RECTAL | Status: AC
  Administered 2018-02-03: 650 mg via RECTAL
  Filled 2018-02-02: qty 1

## 2018-02-02 NOTE — Progress Notes (Signed)
A consult was received from an ED physician for azactam, levaquin and vancomycin per pharmacy dosing.  The patient's profile has been reviewed for ht/wt/allergies/indication/available labs.   A one time order has been placed for Azactam 2 Gm, Levaquin 750 mg and Vancomycin 1500 mg.  Further antibiotics/pharmacy consults should be ordered by admitting physician if indicated.    Please note patient has tolerated  Cefepime, Rocephin and Cefdinir in the past.                  Thank you, Dorrene German 02/02/2018  11:57 PM

## 2018-02-02 NOTE — ED Notes (Signed)
EKG given to EDP, Horton,MD., for review. 

## 2018-02-03 ENCOUNTER — Other Ambulatory Visit: Payer: Self-pay

## 2018-02-03 ENCOUNTER — Encounter (HOSPITAL_COMMUNITY): Payer: Self-pay | Admitting: *Deleted

## 2018-02-03 ENCOUNTER — Emergency Department (HOSPITAL_COMMUNITY): Payer: Medicare Other

## 2018-02-03 DIAGNOSIS — M810 Age-related osteoporosis without current pathological fracture: Secondary | ICD-10-CM | POA: Diagnosis present

## 2018-02-03 DIAGNOSIS — D539 Nutritional anemia, unspecified: Secondary | ICD-10-CM | POA: Diagnosis present

## 2018-02-03 DIAGNOSIS — Z87891 Personal history of nicotine dependence: Secondary | ICD-10-CM | POA: Diagnosis not present

## 2018-02-03 DIAGNOSIS — J189 Pneumonia, unspecified organism: Secondary | ICD-10-CM | POA: Diagnosis present

## 2018-02-03 DIAGNOSIS — R1111 Vomiting without nausea: Secondary | ICD-10-CM | POA: Diagnosis not present

## 2018-02-03 DIAGNOSIS — R112 Nausea with vomiting, unspecified: Secondary | ICD-10-CM | POA: Diagnosis present

## 2018-02-03 DIAGNOSIS — M797 Fibromyalgia: Secondary | ICD-10-CM | POA: Diagnosis present

## 2018-02-03 DIAGNOSIS — E876 Hypokalemia: Secondary | ICD-10-CM | POA: Diagnosis present

## 2018-02-03 DIAGNOSIS — Z882 Allergy status to sulfonamides status: Secondary | ICD-10-CM | POA: Diagnosis not present

## 2018-02-03 DIAGNOSIS — R6520 Severe sepsis without septic shock: Secondary | ICD-10-CM | POA: Diagnosis present

## 2018-02-03 DIAGNOSIS — A419 Sepsis, unspecified organism: Secondary | ICD-10-CM | POA: Diagnosis present

## 2018-02-03 DIAGNOSIS — Z881 Allergy status to other antibiotic agents status: Secondary | ICD-10-CM | POA: Diagnosis not present

## 2018-02-03 DIAGNOSIS — I5032 Chronic diastolic (congestive) heart failure: Secondary | ICD-10-CM | POA: Diagnosis present

## 2018-02-03 DIAGNOSIS — Z9842 Cataract extraction status, left eye: Secondary | ICD-10-CM | POA: Diagnosis not present

## 2018-02-03 DIAGNOSIS — Z7951 Long term (current) use of inhaled steroids: Secondary | ICD-10-CM | POA: Diagnosis not present

## 2018-02-03 DIAGNOSIS — Z9841 Cataract extraction status, right eye: Secondary | ICD-10-CM | POA: Diagnosis not present

## 2018-02-03 DIAGNOSIS — Z7952 Long term (current) use of systemic steroids: Secondary | ICD-10-CM | POA: Diagnosis not present

## 2018-02-03 DIAGNOSIS — Z79899 Other long term (current) drug therapy: Secondary | ICD-10-CM | POA: Diagnosis not present

## 2018-02-03 DIAGNOSIS — Z981 Arthrodesis status: Secondary | ICD-10-CM | POA: Diagnosis not present

## 2018-02-03 DIAGNOSIS — M0579 Rheumatoid arthritis with rheumatoid factor of multiple sites without organ or systems involvement: Secondary | ICD-10-CM

## 2018-02-03 DIAGNOSIS — R410 Disorientation, unspecified: Secondary | ICD-10-CM | POA: Diagnosis present

## 2018-02-03 DIAGNOSIS — N179 Acute kidney failure, unspecified: Secondary | ICD-10-CM | POA: Diagnosis present

## 2018-02-03 DIAGNOSIS — Z888 Allergy status to other drugs, medicaments and biological substances status: Secondary | ICD-10-CM | POA: Diagnosis not present

## 2018-02-03 DIAGNOSIS — Z880 Allergy status to penicillin: Secondary | ICD-10-CM | POA: Diagnosis not present

## 2018-02-03 DIAGNOSIS — E861 Hypovolemia: Secondary | ICD-10-CM | POA: Diagnosis present

## 2018-02-03 DIAGNOSIS — Y95 Nosocomial condition: Secondary | ICD-10-CM | POA: Diagnosis present

## 2018-02-03 DIAGNOSIS — Z91013 Allergy to seafood: Secondary | ICD-10-CM | POA: Diagnosis not present

## 2018-02-03 DIAGNOSIS — M069 Rheumatoid arthritis, unspecified: Secondary | ICD-10-CM | POA: Diagnosis present

## 2018-02-03 HISTORY — DX: Pneumonia, unspecified organism: J18.9

## 2018-02-03 LAB — CBC WITH DIFFERENTIAL/PLATELET
Basophils Absolute: 0 10*3/uL (ref 0.0–0.1)
Basophils Relative: 0 %
Eosinophils Absolute: 0 10*3/uL (ref 0.0–0.7)
Eosinophils Relative: 0 %
HCT: 46.4 % — ABNORMAL HIGH (ref 36.0–46.0)
Hemoglobin: 15.1 g/dL — ABNORMAL HIGH (ref 12.0–15.0)
Lymphocytes Relative: 5 %
Lymphs Abs: 1.1 10*3/uL (ref 0.7–4.0)
MCH: 33.5 pg (ref 26.0–34.0)
MCHC: 32.5 g/dL (ref 30.0–36.0)
MCV: 102.9 fL — ABNORMAL HIGH (ref 78.0–100.0)
Monocytes Absolute: 1.1 10*3/uL — ABNORMAL HIGH (ref 0.1–1.0)
Monocytes Relative: 5 %
Neutro Abs: 18.3 10*3/uL — ABNORMAL HIGH (ref 1.7–7.7)
Neutrophils Relative %: 90 %
Platelets: 364 10*3/uL (ref 150–400)
RBC: 4.51 MIL/uL (ref 3.87–5.11)
RDW: 15.4 % (ref 11.5–15.5)
WBC: 20.5 10*3/uL — ABNORMAL HIGH (ref 4.0–10.5)

## 2018-02-03 LAB — URINALYSIS, ROUTINE W REFLEX MICROSCOPIC
Bilirubin Urine: NEGATIVE
Glucose, UA: NEGATIVE mg/dL
Ketones, ur: NEGATIVE mg/dL
Leukocytes, UA: NEGATIVE
Nitrite: NEGATIVE
Protein, ur: NEGATIVE mg/dL
Specific Gravity, Urine: 1.009 (ref 1.005–1.030)
pH: 7 (ref 5.0–8.0)

## 2018-02-03 LAB — EXPECTORATED SPUTUM ASSESSMENT W REFEX TO RESP CULTURE

## 2018-02-03 LAB — COMPREHENSIVE METABOLIC PANEL
ALT: 19 U/L (ref 14–54)
AST: 31 U/L (ref 15–41)
Albumin: 4 g/dL (ref 3.5–5.0)
Alkaline Phosphatase: 54 U/L (ref 38–126)
Anion gap: 17 — ABNORMAL HIGH (ref 5–15)
BUN: 31 mg/dL — ABNORMAL HIGH (ref 6–20)
CO2: 27 mmol/L (ref 22–32)
Calcium: 10.6 mg/dL — ABNORMAL HIGH (ref 8.9–10.3)
Chloride: 97 mmol/L — ABNORMAL LOW (ref 101–111)
Creatinine, Ser: 1.2 mg/dL — ABNORMAL HIGH (ref 0.44–1.00)
GFR calc Af Amer: 47 mL/min — ABNORMAL LOW (ref >60)
GFR calc non Af Amer: 41 mL/min — ABNORMAL LOW (ref >60)
Glucose, Bld: 179 mg/dL — ABNORMAL HIGH (ref 65–99)
Potassium: 3.4 mmol/L — ABNORMAL LOW (ref 3.5–5.1)
Sodium: 141 mmol/L (ref 135–145)
Total Bilirubin: 0.8 mg/dL (ref 0.3–1.2)
Total Protein: 8.3 g/dL — ABNORMAL HIGH (ref 6.5–8.1)

## 2018-02-03 LAB — PROCALCITONIN: Procalcitonin: 3.7 ng/mL

## 2018-02-03 LAB — STREP PNEUMONIAE URINARY ANTIGEN: Strep Pneumo Urinary Antigen: NEGATIVE

## 2018-02-03 LAB — I-STAT CG4 LACTIC ACID, ED
Lactic Acid, Venous: 3 mmol/L (ref 0.5–1.9)
Lactic Acid, Venous: 3.05 mmol/L (ref 0.5–1.9)

## 2018-02-03 LAB — EXPECTORATED SPUTUM ASSESSMENT W GRAM STAIN, RFLX TO RESP C

## 2018-02-03 LAB — LACTIC ACID, PLASMA: Lactic Acid, Venous: 1.7 mmol/L (ref 0.5–1.9)

## 2018-02-03 LAB — TROPONIN I: Troponin I: <0.03 ng/mL (ref <0.03)

## 2018-02-03 LAB — INFLUENZA PANEL BY PCR (TYPE A & B)
Influenza A By PCR: NEGATIVE
Influenza B By PCR: NEGATIVE

## 2018-02-03 LAB — MRSA PCR SCREENING: MRSA by PCR: POSITIVE — AB

## 2018-02-03 MED ORDER — HYDROCORTISONE NA SUCCINATE PF 100 MG IJ SOLR
100.0000 mg | Freq: Three times a day (TID) | INTRAMUSCULAR | Status: DC
  Administered 2018-02-03 – 2018-02-04 (×4): 100 mg via INTRAVENOUS
  Filled 2018-02-03 (×5): qty 2

## 2018-02-03 MED ORDER — ENOXAPARIN SODIUM 40 MG/0.4ML ~~LOC~~ SOLN
40.0000 mg | SUBCUTANEOUS | Status: DC
  Administered 2018-02-03 – 2018-02-06 (×4): 40 mg via SUBCUTANEOUS
  Filled 2018-02-03 (×5): qty 0.4

## 2018-02-03 MED ORDER — ESOMEPRAZOLE MAGNESIUM 20 MG PO PACK: 20.0000 mg | PACK | Freq: Two times a day (BID) | ORAL | Status: DC

## 2018-02-03 MED ORDER — AZTREONAM IN DEXTROSE 2 GM/50ML IV SOLN
2.0000 g | Freq: Three times a day (TID) | INTRAVENOUS | Status: DC
  Administered 2018-02-03 – 2018-02-04 (×4): 2 g via INTRAVENOUS
  Filled 2018-02-03 (×5): qty 50

## 2018-02-03 MED ORDER — MILNACIPRAN HCL 50 MG PO TABS
50.0000 mg | ORAL_TABLET | Freq: Two times a day (BID) | ORAL | Status: DC
  Administered 2018-02-03 – 2018-02-04 (×3): 50 mg via ORAL
  Filled 2018-02-03 (×8): qty 1

## 2018-02-03 MED ORDER — FLUTICASONE PROPIONATE 50 MCG/ACT NA SUSP
2.0000 | Freq: Every day | NASAL | Status: DC
  Administered 2018-02-03 – 2018-02-06 (×4): 2 via NASAL
  Filled 2018-02-03: qty 16

## 2018-02-03 MED ORDER — CHLORHEXIDINE GLUCONATE CLOTH 2 % EX PADS
6.0000 | MEDICATED_PAD | Freq: Every day | CUTANEOUS | Status: DC
  Administered 2018-02-04 – 2018-02-06 (×3): 6 via TOPICAL

## 2018-02-03 MED ORDER — PANTOPRAZOLE SODIUM 40 MG PO TBEC
40.0000 mg | DELAYED_RELEASE_TABLET | Freq: Two times a day (BID) | ORAL | Status: DC
  Administered 2018-02-03 – 2018-02-06 (×7): 40 mg via ORAL
  Filled 2018-02-03 (×7): qty 1

## 2018-02-03 MED ORDER — LEVOFLOXACIN IN D5W 750 MG/150ML IV SOLN
750.0000 mg | INTRAVENOUS | Status: DC
  Administered 2018-02-04: 750 mg via INTRAVENOUS
  Filled 2018-02-03: qty 150

## 2018-02-03 MED ORDER — ATORVASTATIN CALCIUM 20 MG PO TABS
20.0000 mg | ORAL_TABLET | Freq: Every day | ORAL | Status: DC
  Administered 2018-02-03 – 2018-02-05 (×3): 20 mg via ORAL
  Filled 2018-02-03 (×3): qty 1

## 2018-02-03 MED ORDER — SODIUM CHLORIDE 0.9 % IV SOLN
INTRAVENOUS | Status: DC
  Administered 2018-02-03: 06:00:00 via INTRAVENOUS
  Administered 2018-02-03: 100 mL/h via INTRAVENOUS
  Administered 2018-02-03 – 2018-02-04 (×2): via INTRAVENOUS

## 2018-02-03 MED ORDER — SODIUM CHLORIDE 0.9 % IV BOLUS (SEPSIS)
500.0000 mL | Freq: Once | INTRAVENOUS | Status: AC
  Administered 2018-02-03: 500 mL via INTRAVENOUS

## 2018-02-03 MED ORDER — FOLIC ACID 1 MG PO TABS
1.0000 mg | ORAL_TABLET | Freq: Every day | ORAL | Status: DC
  Administered 2018-02-03 – 2018-02-06 (×4): 1 mg via ORAL
  Filled 2018-02-03 (×4): qty 1

## 2018-02-03 MED ORDER — PREDNISONE 20 MG PO TABS
24.0000 mg | ORAL_TABLET | Freq: Every day | ORAL | Status: DC
  Filled 2018-02-03: qty 4

## 2018-02-03 MED ORDER — VANCOMYCIN HCL IN DEXTROSE 1-5 GM/200ML-% IV SOLN: 1000.0000 mg | INTRAVENOUS | Status: DC

## 2018-02-03 MED ORDER — MUPIROCIN 2 % EX OINT
1.0000 "application " | TOPICAL_OINTMENT | Freq: Two times a day (BID) | CUTANEOUS | Status: DC
  Administered 2018-02-03 – 2018-02-06 (×6): 1 via NASAL
  Filled 2018-02-03: qty 22

## 2018-02-03 MED ORDER — HYDROCORTISONE NA SUCCINATE PF 100 MG IJ SOLR: 50.0000 mg | Freq: Three times a day (TID) | INTRAMUSCULAR | Status: DC

## 2018-02-03 NOTE — ED Triage Notes (Signed)
Pt arrived via EMS from home with c/o fever and vomiting tonight.

## 2018-02-03 NOTE — ED Notes (Addendum)
Lactic acid 3.05 mmol/L. Thayer Jew, MD, and Armanda Heritage, RN, notified.

## 2018-02-03 NOTE — ED Notes (Signed)
Patient transported to CT 

## 2018-02-03 NOTE — ED Notes (Signed)
Notified EDP,Horton,MD. Pt. I-stat CG4 results 3.00 and RN,Kelly, G. Made aware.

## 2018-02-03 NOTE — ED Notes (Signed)
Hospitalist at bedside updating/checking patient.

## 2018-02-03 NOTE — ED Notes (Signed)
Pt daughter Sueanne Margarita [phone] can be contacted with pt disposition/updates.

## 2018-02-03 NOTE — ED Notes (Signed)
ED TO INPATIENT HANDOFF REPORT  Name/Age/Gender Eileen Wilkerson 84 y.o. female  Code Status    Code Status Orders  (From admission, onward)        Start     Ordered   02/03/18 0300  Full code  Continuous     02/03/18 0302    Code Status History    Date Active Date Inactive Code Status Order ID Comments User Context   01/18/2018 21:11 01/21/2018 19:51 Full Code [id number]  Patrecia Pour, MD Inpatient   04/25/2017 14:13 04/27/2017 18:43 Full Code [id number]  Jonetta Osgood, MD ED   12/28/2015 18:55 01/02/2016 20:15 Full Code [id number]  Bonnielee Haff, MD Inpatient      Home/SNF/Other Home  Chief Complaint nvd fever  Level of Care/Admitting Diagnosis ED Disposition    ED Disposition Condition [facility] Area: [facility] [[id number]]  Level of Care: Telemetry [5]  Admit to tele based on following criteria: Complex arrhythmia (Bradycardia/Tachycardia)  Diagnosis: Severe sepsis [facility]) [[id number]]  Admitting Physician: Doreatha Massed  Attending Physician: Etta Quill [phone]  Estimated length of stay: past midnight tomorrow  Certification:: I certify this patient will need inpatient services for at least 2 midnights  PT Class (Do Not Modify): Inpatient [101]  PT Acc Code (Do Not Modify): Private [1]       Medical History Past Medical History:  Diagnosis Date  . Adrenal failure ([place])   . Arthritis   . Cataract   . Fibromyalgia 2008  . Osteoporosis   . RA (rheumatoid arthritis) ([facility])     Allergies Allergies  Allergen Reactions  . Adhesive [Tape] Rash  . Celebrex [Celecoxib] Hives  . Ciprofibrate Nausea Only  . Cymbalta [Duloxetine Hcl] Swelling  . Gabitril [Tiagabine] Swelling  . Keflex [Cephalexin] Nausea And Vomiting  . Lyrica [Pregabalin] Swelling  . Neurontin [Gabapentin] Swelling  . Nexium [Esomeprazole] Rash  . Nsaids Rash  . Penicillins Rash    Injection site reaction. Tolerated cefepime in past Has  patient had a PCN reaction causing immediate rash, facial/tongue/throat swelling, SOB or lightheadedness with hypotension: No Has patient had a PCN reaction causing severe rash involving mucus membranes or skin necrosis: No Has patient had a PCN reaction that required hospitalization No Has patient had a PCN reaction occurring within the last 10 years: No If all of the above answers are "NO", then may proceed with Cephalosporin use.   . Shrimp [Shellfish Allergy] Anaphylaxis    Per patient "shrimp only"  . Sulfa Antibiotics Nausea And Vomiting  . Ciprofloxacin Other (See Comments)    dizziness  . Claritin-D 12 Hour [Loratadine-Pseudoephedrine Er] Anxiety    IV Location/Drains/Wounds Patient Lines/Drains/Airways Status   Active Line/Drains/Airways    Name:   Placement date:   Placement time:   Site:   Days:   Peripheral IV 02/02/18 Right Hand   02/02/18    0035    Hand   1   Peripheral IV 02/03/18 Right;Lateral Antecubital   02/03/18    0005    Antecubital   less than 1   Peripheral IV 02/03/18 Left Forearm   02/03/18    0145    Forearm   less than 1   External Urinary Catheter   01/18/18    2100    -   16          Labs/Imaging Results for orders placed or performed during the hospital encounter of 02/02/18 (from the past  48 hour(s))  Comprehensive metabolic panel     Status: Abnormal   Collection Time: 02/03/18 12:04 AM  Result Value Ref Range   Sodium 141 135 - 145 mmol/L   Potassium 3.4 (L) 3.5 - 5.1 mmol/L   Chloride 97 (L) 101 - 111 mmol/L   CO2 27 22 - 32 mmol/L   Glucose, Bld 179 (H) 65 - 99 mg/dL   BUN 31 (H) 6 - 20 mg/dL   Creatinine, Ser 1.20 (H) 0.44 - 1.00 mg/dL   Calcium 10.6 (H) 8.9 - 10.3 mg/dL   Total Protein 8.3 (H) 6.5 - 8.1 g/dL   Albumin 4.0 3.5 - 5.0 g/dL   AST 31 15 - 41 U/L   ALT 19 14 - 54 U/L   Alkaline Phosphatase 54 38 - 126 U/L   Total Bilirubin 0.8 0.3 - 1.2 mg/dL   GFR calc non Af Amer 41 (L) >60 mL/min   GFR calc Af Amer 47 (L) >60 mL/min     Comment: (NOTE) The eGFR has been calculated using the CKD EPI equation. This calculation has not been validated in all clinical situations. eGFR's persistently <60 mL/min signify possible Chronic Kidney Disease.    Anion gap 17 (H) 5 - 15    Comment: Performed at [facility], [place] [address], [place], [place] [address]  CBC WITH DIFFERENTIAL     Status: Abnormal   Collection Time: 02/03/18 12:04 AM  Result Value Ref Range   WBC 20.5 (H) 4.0 - 10.5 K/uL   RBC 4.51 3.87 - 5.11 MIL/uL   Hemoglobin 15.1 (H) 12.0 - 15.0 g/dL   HCT 46.4 (H) 36.0 - 46.0 %   MCV 102.9 (H) 78.0 - 100.0 fL   MCH 33.5 26.0 - 34.0 pg   MCHC 32.5 30.0 - 36.0 g/dL   RDW 15.4 11.5 - 15.5 %   Platelets 364 150 - 400 K/uL   Neutrophils Relative % 90 %   Neutro Abs 18.3 (H) 1.7 - 7.7 K/uL   Lymphocytes Relative 5 %   Lymphs Abs 1.1 0.7 - 4.0 K/uL   Monocytes Relative 5 %   Monocytes Absolute 1.1 (H) 0.1 - 1.0 K/uL   Eosinophils Relative 0 %   Eosinophils Absolute 0.0 0.0 - 0.7 K/uL   Basophils Relative 0 %   Basophils Absolute 0.0 0.0 - 0.1 K/uL    Comment: Performed at [facility], [place] [address] [place], [place] [address]  Troponin I     Status: None   Collection Time: 02/03/18 12:04 AM  Result Value Ref Range   Troponin I <0.03 <0.03 ng/mL    Comment: Performed at [facility], [place] [address], [place], [place] [address]  I-Stat CG4 Lactic Acid, ED  (not at  [facility])     Status: Abnormal   Collection Time: 02/03/18 12:17 AM  Result Value Ref Range   Lactic Acid, Venous 3.05 (HH) 0.5 - 1.9 mmol/L   Comment NOTIFIED PHYSICIAN   Influenza panel by PCR (type A & B)     Status: None   Collection Time: 02/03/18 12:24 AM  Result Value Ref Range   Influenza A By PCR NEGATIVE NEGATIVE   Influenza B By PCR NEGATIVE NEGATIVE    Comment: (NOTE) The Xpert Xpress Flu assay is intended as an aid in the diagnosis of  influenza and should not be used as a  sole basis for treatment.  This  assay is FDA approved for nasopharyngeal  swab specimens only. Nasal  washings and aspirates are unacceptable for Xpert Xpress Flu testing. Performed at [facility], [place] [address], [place], [place] [address]   Urinalysis, Routine w reflex microscopic     Status: Abnormal   Collection Time: 02/03/18  1:10 AM  Result Value Ref Range   Color, Urine STRAW (A) YELLOW   APPearance CLEAR CLEAR   Specific Gravity, Urine 1.009 1.005 - 1.030   pH 7.0 5.0 - 8.0   Glucose, UA NEGATIVE NEGATIVE mg/dL   Hgb urine dipstick MODERATE (A) NEGATIVE   Bilirubin Urine NEGATIVE NEGATIVE   Ketones, ur NEGATIVE NEGATIVE mg/dL   Protein, ur NEGATIVE NEGATIVE mg/dL   Nitrite NEGATIVE NEGATIVE   Leukocytes, UA NEGATIVE NEGATIVE   RBC / HPF 6-30 0 - 5 RBC/hpf   WBC, UA 0-5 0 - 5 WBC/hpf   Bacteria, UA RARE (A) NONE SEEN   Squamous Epithelial / LPF 0-5 (A) NONE SEEN    Comment: Performed at [facility], [place] [address], [place], [place] [address]  I-Stat CG4 Lactic Acid, ED  (not at  [facility])     Status: Abnormal   Collection Time: 02/03/18  1:48 AM  Result Value Ref Range   Lactic Acid, Venous 3.00 (HH) 0.5 - 1.9 mmol/L   Comment NOTIFIED PHYSICIAN   Strep pneumoniae urinary antigen     Status: None   Collection Time: 02/03/18  5:27 AM  Result Value Ref Range   Strep Pneumo Urinary Antigen NEGATIVE NEGATIVE    Comment:        Infection due to S. pneumoniae cannot be absolutely ruled out since the antigen present may be below the detection limit of the test. Performed at [facility], [place] [address], [place], [place] [address]   Culture, sputum-assessment     Status: None   Collection Time: 02/03/18  9:04 AM  Result Value Ref Range   Specimen Description SPUTUM    Special Requests NONE    Sputum evaluation      THIS SPECIMEN IS ACCEPTABLE FOR SPUTUM CULTURE Performed at [facility], [place] [address], [place], [place] [address]    Report Status 02/03/2018 FINAL   Lactic acid, plasma     Status: None   Collection Time: 02/03/18  9:04 AM  Result Value Ref Range   Lactic Acid, Venous 1.7 0.5 - 1.9 mmol/L    Comment: Performed at [facility], [place] [address] [place], [place] [address]  Procalcitonin - Baseline     Status: None   Collection Time: 02/03/18  9:04 AM  Result Value Ref Range   Procalcitonin 3.70 ng/mL    Comment:        Interpretation: PCT > 2 ng/mL: Systemic infection (sepsis) is likely, unless other causes are known. (NOTE)       Sepsis PCT Algorithm           Lower Respiratory Tract                                      Infection PCT Algorithm    ----------------------------     ----------------------------         PCT < 0.25 ng/mL                PCT < 0.10 ng/mL         Strongly encourage  Strongly discourage   discontinuation of antibiotics    initiation of antibiotics    ----------------------------     -----------------------------       PCT 0.25 - 0.50 ng/mL            PCT 0.10 - 0.25 ng/mL               OR       >80% decrease in PCT            Discourage initiation of                                            antibiotics      Encourage discontinuation           of antibiotics    ----------------------------     -----------------------------         PCT >= 0.50 ng/mL              PCT 0.26 - 0.50 ng/mL               AND       <80% decrease in PCT              Encourage initiation of                                             antibiotics       Encourage continuation           of antibiotics    ----------------------------     -----------------------------        PCT >= 0.50 ng/mL                  PCT > 0.50 ng/mL               AND         increase in PCT                  Strongly encourage                                      initiation of antibiotics    Strongly encourage escalation           of antibiotics                                      -----------------------------                                           PCT <= 0.25 ng/mL                                                 OR                                        >  80% decrease in PCT                                     Discontinue / Do not initiate                                             antibiotics Performed at [place] [address], [place], [place] [address]    Ct Abdomen Pelvis Wo Contrast  Result Date: 02/03/2018 CLINICAL DATA:  Nausea and vomiting EXAM: CT ABDOMEN AND PELVIS WITHOUT CONTRAST TECHNIQUE: Multidetector CT imaging of the abdomen and pelvis was performed following the standard protocol without IV contrast. COMPARISON:  CT lumbar spine 04/21/2015 FINDINGS: Lower chest: Lung bases demonstrate patchy dependent consolidations. This most likely reflects atelectasis. Mild tree-in-bud densities in the lingula, right middle lobe and bilateral lung bases. No pleural effusion. Normal heart size. Hepatobiliary: No focal liver abnormality is seen. No gallstones, gallbladder wall thickening, or biliary dilatation. Pancreas: Unremarkable. No pancreatic ductal dilatation or surrounding inflammatory changes. Spleen: Normal in size without focal abnormality. Adrenals/Urinary Tract: Adrenal glands are unremarkable. Kidneys are normal, without renal calculi, focal lesion, or hydronephrosis. Bladder is unremarkable. Stomach/Bowel: Stomach is within normal limits. Appendix appears normal. No evidence of bowel wall thickening, distention, or inflammatory changes. Vascular/Lymphatic: Mild aortic atherosclerosis. No aneurysmal dilatation. No significantly enlarged lymph nodes Reproductive: Uterus and bilateral adnexa are unremarkable. Other: Negative for free air or free fluid. Tiny fat in the umbilical region Musculoskeletal: Degenerative changes with interbody devices at L3-L4 and L4-L5. IMPRESSION: 1. No CT evidence for acute  intra-abdominal or pelvic abnormality. Negative for bowel obstruction or bowel wall thickening 2. Findings suggesting bronchiolitis or atypical infection at the bilateral lung bases, lingula and right middle lobe. Electronically Signed   By: Donavan Foil M.D.   On: 02/03/2018 02:15   Dg Chest Port 1 View  Result Date: 02/03/2018 CLINICAL DATA:  Sepsis EXAM: PORTABLE CHEST 1 VIEW COMPARISON:  01/19/2018 FINDINGS: Postsurgical changes in the cervical spine. Elevation of left diaphragm with hazy atelectasis at the left base. No consolidation. Stable cardiomediastinal silhouette with atherosclerosis. No pneumothorax. IMPRESSION: No active disease. Stable elevation of left diaphragm with atelectasis or scarring at the left base Electronically Signed   By: Donavan Foil M.D.   On: 02/03/2018 00:10    Pending Labs Unresulted Labs (From admission, onward)   Start     Ordered   02/04/18 0500  CBC  Tomorrow morning,   R     02/03/18 0815   02/04/18 0500  Comprehensive metabolic panel  Tomorrow morning,   R     02/03/18 0815   02/04/18 0500  Procalcitonin  Daily,   R     02/03/18 0816   02/03/18 1233  MRSA PCR Screening  Once,   R     02/03/18 1232   02/03/18 0904  Culture, respiratory (NON-Expectorated)  Once,   R     02/03/18 0904   02/03/18 0257  HIV antibody  Once,   R     02/03/18 0302   02/02/18 2341  Blood Culture (routine x 2)  BLOOD CULTURE X 2,   STAT     02/02/18 2342      Vitals/Pain Today's Vitals   02/03/18 1300 02/03/18 1314 02/03/18 1330 02/03/18  1400  BP: 108/67 108/67 (!) 103/57 (!) 103/57  Pulse: 93 88 87 88  Resp: (!) '21 17 17 19  '$ Temp:      TempSrc:      SpO2: 94% 95% 96% 95%  PainSc:        Isolation Precautions Droplet precaution  Medications Medications  enoxaparin (LOVENOX) injection 40 mg (not administered)  0.9 %  sodium chloride infusion (100 mL/hr Intravenous New Bag/Given 02/03/18 0858)  atorvastatin (LIPITOR) tablet 20 mg (not administered)   fluticasone (FLONASE) 50 MCG/ACT nasal spray 2 spray (2 sprays Each Nare Given 9/59/74 7185)  folic acid (FOLVITE) tablet 1 mg (1 mg Oral Given 02/03/18 0902)  Milnacipran (SAVELLA) tablet TABS 50 mg (50 mg Oral Given 02/03/18 0902)  aztreonam (AZACTAM) 2 GM IVPB (0 g Intravenous Stopped 02/03/18 0927)  levofloxacin (LEVAQUIN) IVPB 750 mg (not administered)  vancomycin (VANCOCIN) IVPB 1000 mg/200 mL premix (not administered)  hydrocortisone sodium succinate (SOLU-CORTEF) 100 MG injection 100 mg (100 mg Intravenous Given 02/03/18 0850)  pantoprazole (PROTONIX) EC tablet 40 mg (40 mg Oral Given 02/03/18 1124)  sodium chloride 0.9 % bolus 1,000 mL (0 mLs Intravenous Stopped 02/03/18 0055)    And  sodium chloride 0.9 % bolus 1,000 mL (0 mLs Intravenous Stopped 02/03/18 0114)    And  sodium chloride 0.9 % bolus 500 mL (0 mLs Intravenous Stopped 02/03/18 0110)  levofloxacin (LEVAQUIN) IVPB 750 mg (0 mg Intravenous Stopped 02/03/18 0229)  aztreonam (AZACTAM) 2 GM IVPB (0 g Intravenous Stopped 02/03/18 0044)  acetaminophen (TYLENOL) suppository 650 mg (650 mg Rectal Given 02/03/18 0040)  vancomycin (VANCOCIN) 1,500 mg in sodium chloride 0.9 % 500 mL IVPB (0 mg Intravenous Stopped 02/03/18 0217)  sodium chloride 0.9 % bolus 500 mL (0 mLs Intravenous Stopped 02/03/18 0721)    Mobility walks with person assist

## 2018-02-03 NOTE — H&P (Signed)
History and Physical    Eileen Wilkerson:[id number] DOB: Sep 28, 1932 DOA: 02/02/2018  PCP: Briscoe Deutscher, DO  Patient coming from: Home  I have personally briefly reviewed patient's old medical records in [place]  Chief Complaint: N/V, Fever  HPI: Eileen Wilkerson is a 84 y.o. female with medical history significant of UTIs, rheumatoid arthritis on immunosuppressants, diastolic heart failure who presents with fever, confusion, nausea and vomiting.  Patient recently admitted to our service with E.Coli UTI earlier this month, finished course of ABx.  Had been feeling well until today when she developed increasing confusion, Tm 103, NBNB emesis.  No cough no respiratory symptoms.   ED Course: Tm 103.8, Tachy 138, RR 27, SBP running 100-110.  Fever down to 100.5, HR 102, RR 22 after treatment.  WBC 20.5k.  Desated to 86% on RA after vomiting episode so got put on 2L via [place] but now 98%.  Got 2.5L NS bolus in ED, aztreonam, levaquin, vanc.  CXR neg, UA neg, CT abd/pelvis suggests atypical PNA or bronchiolitis in lower lungs.   Review of Systems: As per HPI otherwise 10 point review of systems negative.   Past Medical History:  Diagnosis Date  . Adrenal failure ([place])   . Arthritis   . Cataract   . Fibromyalgia 2008  . Osteoporosis   . RA (rheumatoid arthritis) ([place])     Past Surgical History:  Procedure Laterality Date  . BACK SURGERY    . BILATERAL CARPAL TUNNEL RELEASE    . CATARACT EXTRACTION, BILATERAL  2004  . CERVICAL FUSION  2011,2010,2008  . HEEL SPUR SURGERY  2004  . ROTATOR CUFF REPAIR    . TONSILLECTOMY AND ADENOIDECTOMY  1947  . TOTAL SHOULDER ARTHROPLASTY       reports that she quit smoking about 51 years ago. Her smoking use included cigarettes. She has a 7.50 pack-year smoking history. she has never used smokeless tobacco. She reports that she does not drink alcohol or use drugs.  Allergies  Allergen Reactions  . Adhesive [Tape] Rash  .  Celebrex [Celecoxib] Hives  . Ciprofibrate Nausea Only  . Cymbalta [Duloxetine Hcl] Swelling  . Gabitril [Tiagabine] Swelling  . Keflex [Cephalexin] Nausea And Vomiting  . Lyrica [Pregabalin] Swelling  . Neurontin [Gabapentin] Swelling  . Nexium [Esomeprazole] Rash  . Nsaids Rash  . Penicillins Rash    Injection site reaction. Tolerated cefepime in past Has patient had a PCN reaction causing immediate rash, facial/tongue/throat swelling, SOB or lightheadedness with hypotension: No Has patient had a PCN reaction causing severe rash involving mucus membranes or skin necrosis: No Has patient had a PCN reaction that required hospitalization No Has patient had a PCN reaction occurring within the last 10 years: No If all of the above answers are "NO", then may proceed with Cephalosporin use.   . Shrimp [Shellfish Allergy] Anaphylaxis    Per patient "shrimp only"  . Sulfa Antibiotics Nausea And Vomiting  . Ciprofloxacin Other (See Comments)    dizziness  . Claritin-D 12 Hour [Loratadine-Pseudoephedrine Er] Anxiety    Family History  Problem Relation Age of Onset  . Breast cancer Mother   . Diabetes Father      Prior to Admission medications   Medication Sig Start Date End Date Taking? Authorizing Provider  acetaminophen (TYLENOL) 650 MG CR tablet Take 500 mg by mouth 3 (three) times daily.    Yes [provider]  atorvastatin (LIPITOR) 20 MG tablet TAKE 1 TABLET BY MOUTH ONCE  DAILY AT BEDTIME AS NEEDED Patient taking differently: TAKE 1 TABLET BY MOUTH ONCE DAILY AT BEDTIME 01/08/18  Yes Briscoe Deutscher, DO  Biotin (BIOTIN MAXIMUM STRENGTH) 10 MG TABS Take 10 mg by mouth daily.   Yes [provider]  Cholecalciferol (VITAMIN D3) 5000 units CAPS Take 5,000 Units by mouth daily.   Yes [provider]  esomeprazole (NEXIUM) 20 MG packet Take 20 mg by mouth 2 (two) times daily.   Yes [provider]  fluticasone (FLONASE) 50 MCG/ACT nasal spray Place 2  sprays into both nostrils daily. 12/26/17  Yes Briscoe Deutscher, DO  folic acid (FOLVITE) 1 MG tablet Take 1 mg by mouth daily.   Yes [provider]  furosemide (LASIX) 80 MG tablet Take 1 tablet (80 mg total) by mouth daily. 06/26/17  Yes Briscoe Deutscher, DO  hydroxychloroquine (PLAQUENIL) 200 MG tablet TAKE 1 TABLET BY MOUTH ONCE DAILY 08/14/17  Yes Deveshwar, Abel Presto, MD  OTREXUP 15 MG/0.4ML SOAJ INJECT ONE PEN SUBCUTANEOUSLY ONCE EVERY WEEK. STORE AT ROOM TEMPERATURE BETWEEN 68 - 14 DEGREES F. 01/03/18  Yes Deveshwar, Abel Presto, MD  predniSONE (DELTASONE) 1 MG tablet TAKE TWO TABLETS BY MOUTH ONCE DAILY Patient taking differently: TAKE THREE TABLETS BY MOUTH ONCE DAILY 09/17/17  Yes Deveshwar, Abel Presto, MD  predniSONE (DELTASONE) 5 MG tablet TAKE 1 TABLET BY MOUTH ONCE DAILY WITH BREAKFAST 11/16/17  Yes Deveshwar, Abel Presto, MD  SAVELLA 50 MG TABS tablet TAKE 1 TABLET BY MOUTH TWICE DAILY Patient taking differently: TAKE 0.5 TABLET BY MOUTH TWICE DAILY 10/17/17  Yes Briscoe Deutscher, DO  traMADol (ULTRAM) 50 MG tablet TAKE 1 TABLET BY MOUTH TWICE DAILY AS NEEDED Patient taking differently: TAKE 1 TABLET BY MOUTH TWICE DAILY AS NEEDED PAIN 01/22/18  Yes Ofilia Neas, PA-C    Physical Exam: Vitals:   02/03/18 0115 02/03/18 0130 02/03/18 0216 02/03/18 0230  BP: (!) 153/76 128/74 116/62 (!) 115/59  Pulse: (!) 124 (!) 118 (!) 110 (!) 105  Resp: (!) 27 (!) 22 (!) 21 20  Temp:   (!) 100.5 F (38.1 C)   TempSrc:      SpO2: 96% 96% 99% 96%    Constitutional: NAD, calm, comfortable Eyes: PERRL, lids and conjunctivae normal ENMT: Mucous membranes are moist. Posterior pharynx clear of any exudate or lesions.Normal dentition.  Neck: normal, supple, no masses, no thyromegaly Respiratory: clear to auscultation bilaterally, no wheezing, no crackles. Normal respiratory effort. No accessory muscle use.  Cardiovascular: Regular rate and rhythm, no murmurs / rubs / gallops. No extremity edema. 2+ pedal pulses.  No carotid bruits.  Abdomen: no tenderness, no masses palpated. No hepatosplenomegaly. Bowel sounds positive.  Musculoskeletal: no clubbing / cyanosis. No joint deformity upper and lower extremities. Good ROM, no contractures. Normal muscle tone.  Skin: no rashes, lesions, ulcers. No induration Neurologic: CN 2-12 grossly intact. Sensation intact, DTR normal. Strength 5/5 in all 4.  Psychiatric: Normal judgment and insight. Alert and oriented x 3. Normal mood.    Labs on Admission: I have personally reviewed following labs and imaging studies  CBC: Recent Labs  Lab 02/03/18 0004  WBC 20.5*  NEUTROABS 18.3*  HGB 15.1*  HCT 46.4*  MCV 102.9*  PLT 825   Basic Metabolic Panel: Recent Labs  Lab 02/03/18 0004  NA 141  K 3.4*  CL 97*  CO2 27  GLUCOSE 179*  BUN 31*  CREATININE 1.20*  CALCIUM 10.6*   GFR: Estimated Creatinine Clearance: 35.2 mL/min (A) (by C-G formula based on  SCr of 1.2 mg/dL (H)). Liver Function Tests: Recent Labs  Lab 02/03/18 0004  AST 31  ALT 19  ALKPHOS 54  BILITOT 0.8  PROT 8.3*  ALBUMIN 4.0   No results for input(s): LIPASE, AMYLASE in the last 168 hours. No results for input(s): AMMONIA in the last 168 hours. Coagulation Profile: No results for input(s): INR, PROTIME in the last 168 hours. Cardiac Enzymes: Recent Labs  Lab 02/03/18 0004  TROPONINI <0.03   BNP (last 3 results) No results for input(s): PROBNP in the last 8760 hours. HbA1C: No results for input(s): HGBA1C in the last 72 hours. CBG: No results for input(s): GLUCAP in the last 168 hours. Lipid Profile: No results for input(s): CHOL, HDL, LDLCALC, TRIG, CHOLHDL, LDLDIRECT in the last 72 hours. Thyroid Function Tests: No results for input(s): TSH, T4TOTAL, FREET4, T3FREE, THYROIDAB in the last 72 hours. Anemia Panel: No results for input(s): VITAMINB12, FOLATE, FERRITIN, TIBC, IRON, RETICCTPCT in the last 72 hours. Urine analysis:    Component Value Date/Time    COLORURINE STRAW (A) 02/03/2018 0110   APPEARANCEUR CLEAR 02/03/2018 0110   LABSPEC 1.009 02/03/2018 0110   PHURINE 7.0 02/03/2018 0110   GLUCOSEU NEGATIVE 02/03/2018 0110   HGBUR MODERATE (A) 02/03/2018 0110   BILIRUBINUR NEGATIVE 02/03/2018 0110   BILIRUBINUR Negative 01/14/2018 1454   KETONESUR NEGATIVE 02/03/2018 0110   PROTEINUR NEGATIVE 02/03/2018 0110   UROBILINOGEN 0.2 01/14/2018 1454   UROBILINOGEN 0.2 04/12/2010 1121   NITRITE NEGATIVE 02/03/2018 0110   LEUKOCYTESUR NEGATIVE 02/03/2018 0110    Radiological Exams on Admission: Ct Abdomen Pelvis Wo Contrast  Result Date: 02/03/2018 CLINICAL DATA:  Nausea and vomiting EXAM: CT ABDOMEN AND PELVIS WITHOUT CONTRAST TECHNIQUE: Multidetector CT imaging of the abdomen and pelvis was performed following the standard protocol without IV contrast. COMPARISON:  CT lumbar spine 04/21/2015 FINDINGS: Lower chest: Lung bases demonstrate patchy dependent consolidations. This most likely reflects atelectasis. Mild tree-in-bud densities in the lingula, right middle lobe and bilateral lung bases. No pleural effusion. Normal heart size. Hepatobiliary: No focal liver abnormality is seen. No gallstones, gallbladder wall thickening, or biliary dilatation. Pancreas: Unremarkable. No pancreatic ductal dilatation or surrounding inflammatory changes. Spleen: Normal in size without focal abnormality. Adrenals/Urinary Tract: Adrenal glands are unremarkable. Kidneys are normal, without renal calculi, focal lesion, or hydronephrosis. Bladder is unremarkable. Stomach/Bowel: Stomach is within normal limits. Appendix appears normal. No evidence of bowel wall thickening, distention, or inflammatory changes. Vascular/Lymphatic: Mild aortic atherosclerosis. No aneurysmal dilatation. No significantly enlarged lymph nodes Reproductive: Uterus and bilateral adnexa are unremarkable. Other: Negative for free air or free fluid. Tiny fat in the umbilical region Musculoskeletal:  Degenerative changes with interbody devices at L3-L4 and L4-L5. IMPRESSION: 1. No CT evidence for acute intra-abdominal or pelvic abnormality. Negative for bowel obstruction or bowel wall thickening 2. Findings suggesting bronchiolitis or atypical infection at the bilateral lung bases, lingula and right middle lobe. Electronically Signed   By: Donavan Foil M.D.   On: 02/03/2018 02:15   Dg Chest Port 1 View  Result Date: 02/03/2018 CLINICAL DATA:  Sepsis EXAM: PORTABLE CHEST 1 VIEW COMPARISON:  01/19/2018 FINDINGS: Postsurgical changes in the cervical spine. Elevation of left diaphragm with hazy atelectasis at the left base. No consolidation. Stable cardiomediastinal silhouette with atherosclerosis. No pneumothorax. IMPRESSION: No active disease. Stable elevation of left diaphragm with atelectasis or scarring at the left base Electronically Signed   By: Donavan Foil M.D.   On: 02/03/2018 00:10    EKG: Independently reviewed.  Assessment/Plan Principal Problem:   Severe sepsis (HCC) Active Problems:   Rheumatoid arthritis ([place])   Long term current use of systemic steroids   HCAP (healthcare-associated pneumonia)   Nausea and vomiting    1. Severe sepsis - 1. Possibly due to the atypical PNA / bronchiolitis seen on CT scan. 2. Odd though that she isnt having more respiratory symptoms with this. 3. Never the less, will continue aztreonam, levaquin, vanc for now 4. BCx, sputum Cx pending 5. IVF: 2.5L bolus in ED and 125 cc/hr; hold home lasix 6. Tele monitor for tachycardia which has improved 7. Tylenol PRN fever 2. Long term steroid use - 1. Will increase steroid dosing to "sick dose" by trippling up: so 24mg  prednisone Qday for now. 2. Given no hypotension, and initial positive response to fluids, will hold off on stress dose steroids for right now. 3. RA - 1. Steroid increase as above 2. Holding MTX and plaquenil during admission for severe sepsis. 4. N/V - 1. zofran PRN  nausea 2. Clear liquid diet  DVT prophylaxis: Lovenox Code Status: Full Family Communication: No family in room Disposition Plan: Home after admit Consults called: None Admission status: Admit to inpatient   [place], [place] [facility] Pager [phone]  If 7AM-7PM, please contact day team taking care of patient [email or web address] Password [email or web address]  02/03/2018, 3:24 AM

## 2018-02-03 NOTE — Progress Notes (Signed)
Pharmacy Antibiotic Note  Eileen Wilkerson is a 84 y.o. female admitted on 02/02/2018 with sepsis.  Pharmacy has been consulted for azactam, levaquin and vancomycin dosing.  Plan: Azactam 2 Gm IV q8h Levaquin 750 mg IV q48h Vancomycin 1500 mg x1 then 1 Gm IV q48h for est AUC=425 Goal AUC = 400-500 F/u scr/cultures/levels *Note patient has tolerated cephalosporins in the past**      Temp (24hrs), Avg:102.2 F (39 C), Min:100.5 F (38.1 C), Max:103.8 F (39.9 C)  Recent Labs  Lab 02/03/18 0004 02/03/18 0017 02/03/18 0148  WBC 20.5*  --   --   CREATININE 1.20*  --   --   LATICACIDVEN  --  3.05* 3.00*    Estimated Creatinine Clearance: 35.2 mL/min (A) (by C-G formula based on SCr of 1.2 mg/dL (H)).    Allergies  Allergen Reactions  . Adhesive [Tape] Rash  . Celebrex [Celecoxib] Hives  . Ciprofibrate Nausea Only  . Cymbalta [Duloxetine Hcl] Swelling  . Gabitril [Tiagabine] Swelling  . Keflex [Cephalexin] Nausea And Vomiting  . Lyrica [Pregabalin] Swelling  . Neurontin [Gabapentin] Swelling  . Nexium [Esomeprazole] Rash  . Nsaids Rash  . Penicillins Rash    Injection site reaction. Tolerated cefepime in past Has patient had a PCN reaction causing immediate rash, facial/tongue/throat swelling, SOB or lightheadedness with hypotension: No Has patient had a PCN reaction causing severe rash involving mucus membranes or skin necrosis: No Has patient had a PCN reaction that required hospitalization No Has patient had a PCN reaction occurring within the last 10 years: No If all of the above answers are "NO", then may proceed with Cephalosporin use.   . Shrimp [Shellfish Allergy] Anaphylaxis    Per patient "shrimp only"  . Sulfa Antibiotics Nausea And Vomiting  . Ciprofloxacin Other (See Comments)    dizziness  . Claritin-D 12 Hour [Loratadine-Pseudoephedrine Er] Anxiety    Antimicrobials this admission: 2/23 azactam >>  2/23 levaquin >>  2/23 vancomycin >>  Dose  adjustments this admission:   Microbiology results:  BCx:   UCx:    Sputum:    MRSA PCR:   Thank you for allowing pharmacy to be a part of this patient's care.  Dorrene German 02/03/2018 3:35 AM

## 2018-02-03 NOTE — Progress Notes (Signed)
MD [place] Pink paged to be made aware of positive MRSA PCR result.

## 2018-02-03 NOTE — ED Provider Notes (Signed)
[facility] DEPT Provider Note   CSN: [id number] Arrival date & time: 02/02/18  2314     History   Chief Complaint Chief Complaint  Patient presents with  . Fever    HPI Eileen Wilkerson is a 84 y.o. female.  HPI  This is an 84 year old female with history of UTIs, rheumatoid arthritis on immunosuppressants, diastolic heart failure who presents with fever, confusion, nausea and vomiting.  Patient reports with her daughter who provides most of the history.  Daughter reports that she was diagnosed with a UTI in early February.  She finished a course of antibiotics.  She had been feeling well until today when she had increasing confusion this evening and temperatures to 103 at home.  She also had nonbilious, nonbloody emesis.  Patient provides minimal history.  She does appear oriented.  Daughter denies any recent cough or upper respiratory symptoms.  Patient is actively vomiting on exam.  Level 5 caveat for acuity of condition  Past Medical History:  Diagnosis Date  . Adrenal failure ([place])   . Arthritis   . Cataract   . Fibromyalgia 2008  . Osteoporosis   . RA (rheumatoid arthritis) [facility])     Patient Active Problem List   Diagnosis Date Noted  . Severe sepsis ([place]) 02/03/2018  . HCAP (healthcare-associated pneumonia) 02/03/2018  . Nausea and vomiting 02/03/2018  . Sepsis due to urinary tract infection ([place]) 01/18/2018  . E. coli UTI 01/18/2018  . Hypoxia 01/18/2018  . Chronic diastolic CHF (congestive heart failure) ([place]) 01/18/2018  . Idiopathic chronic venous hypertension of both lower extremities with ulcer and inflammation ([place]) 09/18/2017  . Closed nondisplaced fracture of fifth right metatarsal bone 09/18/2017  . Bilateral lower extremity edema 06/30/2017  . Physical deconditioning 05/30/2017  . Immunosuppressed status ([place]) 05/30/2017  . Abnormal chest x-ray 05/17/2017  . PNA (pneumonia) 04/25/2017  . Hypokalemia 04/25/2017  .  Fibromyalgia 12/18/2016  . DJD (degenerative joint disease), cervical 12/18/2016  . Spondylosis of lumbar region without myelopathy or radiculopathy 12/18/2016  . Trigger finger, right middle finger 12/18/2016  . Primary osteoarthritis of both hands 12/18/2016  . Primary osteoarthritis of both feet 12/18/2016  . Primary osteoarthritis of both knees 12/18/2016  . GERD (gastroesophageal reflux disease) 12/18/2016  . Age-related osteoporosis without current pathological fracture 12/18/2016  . High risk medication use 12/18/2016  . Rheumatoid arthritis ([place]) 12/28/2015  . Long term current use of systemic steroids 12/28/2015  . Lymphocytosis 11/13/2012    Past Surgical History:  Procedure Laterality Date  . BACK SURGERY    . BILATERAL CARPAL TUNNEL RELEASE    . CATARACT EXTRACTION, BILATERAL  2004  . CERVICAL FUSION  2011,2010,2008  . HEEL SPUR SURGERY  2004  . ROTATOR CUFF REPAIR    . TONSILLECTOMY AND ADENOIDECTOMY  1947  . TOTAL SHOULDER ARTHROPLASTY      OB History    No data available       Home Medications    Prior to Admission medications   Medication Sig Start Date End Date Taking? Authorizing Provider  acetaminophen (TYLENOL) 650 MG CR tablet Take 500 mg by mouth 3 (three) times daily.    Yes [provider]  atorvastatin (LIPITOR) 20 MG tablet TAKE 1 TABLET BY MOUTH ONCE DAILY AT BEDTIME AS NEEDED Patient taking differently: TAKE 1 TABLET BY MOUTH ONCE DAILY AT BEDTIME 01/08/18  Yes Briscoe Deutscher, DO  Biotin (BIOTIN MAXIMUM STRENGTH) 10 MG TABS Take 10 mg by mouth daily.  Yes [provider]  Cholecalciferol (VITAMIN D3) 5000 units CAPS Take 5,000 Units by mouth daily.   Yes [provider]  esomeprazole (NEXIUM) 20 MG packet Take 20 mg by mouth 2 (two) times daily.   Yes [provider]  fluticasone (FLONASE) 50 MCG/ACT nasal spray Place 2 sprays into both nostrils daily. 12/26/17  Yes Briscoe Deutscher, DO  folic acid (FOLVITE) 1 MG  tablet Take 1 mg by mouth daily.   Yes [provider]  furosemide (LASIX) 80 MG tablet Take 1 tablet (80 mg total) by mouth daily. 06/26/17  Yes Briscoe Deutscher, DO  hydroxychloroquine (PLAQUENIL) 200 MG tablet TAKE 1 TABLET BY MOUTH ONCE DAILY 08/14/17  Yes Deveshwar, Abel Presto, MD  OTREXUP 15 MG/0.4ML SOAJ INJECT ONE PEN SUBCUTANEOUSLY ONCE EVERY WEEK. STORE AT ROOM TEMPERATURE BETWEEN 16 - 79 DEGREES F. 01/03/18  Yes Deveshwar, Abel Presto, MD  predniSONE (DELTASONE) 1 MG tablet TAKE TWO TABLETS BY MOUTH ONCE DAILY Patient taking differently: TAKE THREE TABLETS BY MOUTH ONCE DAILY 09/17/17  Yes Deveshwar, Abel Presto, MD  predniSONE (DELTASONE) 5 MG tablet TAKE 1 TABLET BY MOUTH ONCE DAILY WITH BREAKFAST 11/16/17  Yes Deveshwar, Abel Presto, MD  SAVELLA 50 MG TABS tablet TAKE 1 TABLET BY MOUTH TWICE DAILY Patient taking differently: TAKE 0.5 TABLET BY MOUTH TWICE DAILY 10/17/17  Yes Briscoe Deutscher, DO  traMADol (ULTRAM) 50 MG tablet TAKE 1 TABLET BY MOUTH TWICE DAILY AS NEEDED Patient taking differently: TAKE 1 TABLET BY MOUTH TWICE DAILY AS NEEDED PAIN 01/22/18  Yes Ofilia Neas, PA-C    Family History Family History  Problem Relation Age of Onset  . Breast cancer Mother   . Diabetes Father     Social History Social History   Tobacco Use  . Smoking status: Former Smoker    Packs/day: 0.75    Years: 10.00    Pack years: 7.50    Types: Cigarettes    Last attempt to quit: 1968    Years since quitting: 51.1  . Smokeless tobacco: Never Used  Substance Use Topics  . Alcohol use: No  . Drug use: No     Allergies   Adhesive [tape]; Celebrex [celecoxib]; Ciprofibrate; Cymbalta [duloxetine hcl]; Gabitril [tiagabine]; Keflex [cephalexin]; Lyrica [pregabalin]; Neurontin [gabapentin]; Nexium [esomeprazole]; Nsaids; Penicillins; Shrimp [shellfish allergy]; Sulfa antibiotics; Ciprofloxacin; and Claritin-d 12 hour [loratadine-pseudoephedrine er]   Review of Systems Review of Systems  Constitutional:  Positive for fever.  Respiratory: Negative for cough and shortness of breath.   Cardiovascular: Positive for chest pain.  Gastrointestinal: Positive for nausea and vomiting. Negative for abdominal pain and diarrhea.  Genitourinary: Negative for dysuria.  Full review of systems cannot be obtained secondary to acuity of condition   Physical Exam Updated Vital Signs BP (!) 108/58   Pulse (!) 102   Temp (!) 100.5 F (38.1 C)   Resp (!) 22   SpO2 98%   Physical Exam  Constitutional:  Ill-appearing, actively vomiting  HENT:  Head: Normocephalic and atraumatic.  Mucous membranes dry  Eyes: Pupils are equal, round, and reactive to light.  Cardiovascular: Regular rhythm and normal heart sounds.  Tachycardia  Pulmonary/Chest: Effort normal and breath sounds normal. No respiratory distress. She has no wheezes.  Abdominal: Soft. Bowel sounds are normal. There is tenderness. There is no guarding.  Diffuse mild tenderness without rebound or guarding  Neurological: She is alert.  Somnolent but arousable and oriented  Skin: Skin is warm and dry.  Psychiatric: She has a normal mood and affect.  Nursing note and vitals reviewed.    ED Treatments / Results  Labs (all labs ordered are listed, but only abnormal results are displayed) Labs Reviewed  COMPREHENSIVE METABOLIC PANEL - Abnormal; Notable for the following components:      Result Value   Potassium 3.4 (*)    Chloride 97 (*)    Glucose, Bld 179 (*)    BUN 31 (*)    Creatinine, Ser 1.20 (*)    Calcium 10.6 (*)    Total Protein 8.3 (*)    GFR calc non Af Amer 41 (*)    GFR calc Af Amer 47 (*)    Anion gap 17 (*)    All other components within normal limits  CBC WITH DIFFERENTIAL/PLATELET - Abnormal; Notable for the following components:   WBC 20.5 (*)    Hemoglobin 15.1 (*)    HCT 46.4 (*)    MCV 102.9 (*)    Neutro Abs 18.3 (*)    Monocytes Absolute 1.1 (*)    All other components within normal limits  URINALYSIS,  ROUTINE W REFLEX MICROSCOPIC - Abnormal; Notable for the following components:   Color, Urine STRAW (*)    Hgb urine dipstick MODERATE (*)    Bacteria, UA RARE (*)    Squamous Epithelial / LPF 0-5 (*)    All other components within normal limits  I-STAT CG4 LACTIC ACID, ED - Abnormal; Notable for the following components:   Lactic Acid, Venous 3.05 (*)    All other components within normal limits  I-STAT CG4 LACTIC ACID, ED - Abnormal; Notable for the following components:   Lactic Acid, Venous 3.00 (*)    All other components within normal limits  CULTURE, BLOOD (ROUTINE X 2)  CULTURE, BLOOD (ROUTINE X 2)  CULTURE, EXPECTORATED SPUTUM-ASSESSMENT  GRAM STAIN  INFLUENZA PANEL BY PCR (TYPE A & B)  TROPONIN I  HIV ANTIBODY (ROUTINE TESTING)  STREP PNEUMONIAE URINARY ANTIGEN  I-STAT TROPONIN, ED    EKG  EKG Interpretation  Date/Time:  Saturday February 02 2018 23:45:56 EST Ventricular Rate:  134 PR Interval:    QRS Duration: 108 QT Interval:  401 QTC Calculation: 599 R Axis:   -53 Text Interpretation:  Sinus tachycardia Left anterior fascicular block LVH with secondary repolarization abnormality Anterior infarct, old Borderline ST elevation, lateral leads Prolonged QT interval Confirmed by Thayer Jew [phone]) on 02/03/2018 12:09:37 AM       Radiology Ct Abdomen Pelvis Wo Contrast  Result Date: 02/03/2018 CLINICAL DATA:  Nausea and vomiting EXAM: CT ABDOMEN AND PELVIS WITHOUT CONTRAST TECHNIQUE: Multidetector CT imaging of the abdomen and pelvis was performed following the standard protocol without IV contrast. COMPARISON:  CT lumbar spine 04/21/2015 FINDINGS: Lower chest: Lung bases demonstrate patchy dependent consolidations. This most likely reflects atelectasis. Mild tree-in-bud densities in the lingula, right middle lobe and bilateral lung bases. No pleural effusion. Normal heart size. Hepatobiliary: No focal liver abnormality is seen. No gallstones, gallbladder wall  thickening, or biliary dilatation. Pancreas: Unremarkable. No pancreatic ductal dilatation or surrounding inflammatory changes. Spleen: Normal in size without focal abnormality. Adrenals/Urinary Tract: Adrenal glands are unremarkable. Kidneys are normal, without renal calculi, focal lesion, or hydronephrosis. Bladder is unremarkable. Stomach/Bowel: Stomach is within normal limits. Appendix appears normal. No evidence of bowel wall thickening, distention, or inflammatory changes. Vascular/Lymphatic: Mild aortic atherosclerosis. No aneurysmal dilatation. No significantly enlarged lymph nodes Reproductive: Uterus and bilateral adnexa are unremarkable. Other: Negative for free air or free fluid. Tiny fat in the umbilical  region Musculoskeletal: Degenerative changes with interbody devices at L3-L4 and L4-L5. IMPRESSION: 1. No CT evidence for acute intra-abdominal or pelvic abnormality. Negative for bowel obstruction or bowel wall thickening 2. Findings suggesting bronchiolitis or atypical infection at the bilateral lung bases, lingula and right middle lobe. Electronically Signed   By: Donavan Foil M.D.   On: 02/03/2018 02:15   Dg Chest Port 1 View  Result Date: 02/03/2018 CLINICAL DATA:  Sepsis EXAM: PORTABLE CHEST 1 VIEW COMPARISON:  01/19/2018 FINDINGS: Postsurgical changes in the cervical spine. Elevation of left diaphragm with hazy atelectasis at the left base. No consolidation. Stable cardiomediastinal silhouette with atherosclerosis. No pneumothorax. IMPRESSION: No active disease. Stable elevation of left diaphragm with atelectasis or scarring at the left base Electronically Signed   By: Donavan Foil M.D.   On: 02/03/2018 00:10    Procedures Procedures (including critical care time)  CRITICAL CARE Performed by: Merryl Hacker   Total critical care time: 45 minutes  Critical care time was exclusive of separately billable procedures and treating other patients.  Critical care was necessary to  treat or prevent imminent or life-threatening deterioration.  Critical care was time spent personally by me on the following activities: development of treatment plan with patient and/or surrogate as well as nursing, discussions with consultants, evaluation of patient's response to treatment, examination of patient, obtaining history from patient or surrogate, ordering and performing treatments and interventions, ordering and review of laboratory studies, ordering and review of radiographic studies, pulse oximetry and re-evaluation of patient's condition.   Medications Ordered in ED Medications  enoxaparin (LOVENOX) injection 40 mg (not administered)  0.9 %  sodium chloride infusion (not administered)  predniSONE (DELTASONE) tablet 24 mg (not administered)  atorvastatin (LIPITOR) tablet 20 mg (not administered)  esomeprazole (NEXIUM) suspension 20 mg (not administered)  fluticasone (FLONASE) 50 MCG/ACT nasal spray 2 spray (not administered)  folic acid (FOLVITE) tablet 1 mg (not administered)  Milnacipran (SAVELLA) tablet TABS 50 mg (not administered)  aztreonam (AZACTAM) 2 GM IVPB (not administered)  levofloxacin (LEVAQUIN) IVPB 750 mg (not administered)  vancomycin (VANCOCIN) IVPB 1000 mg/200 mL premix (not administered)  sodium chloride 0.9 % bolus 1,000 mL (0 mLs Intravenous Stopped 02/03/18 0055)    And  sodium chloride 0.9 % bolus 1,000 mL (0 mLs Intravenous Stopped 02/03/18 0114)    And  sodium chloride 0.9 % bolus 500 mL (0 mLs Intravenous Stopped 02/03/18 0110)  levofloxacin (LEVAQUIN) IVPB 750 mg (0 mg Intravenous Stopped 02/03/18 0229)  aztreonam (AZACTAM) 2 GM IVPB (0 g Intravenous Stopped 02/03/18 0044)  acetaminophen (TYLENOL) suppository 650 mg (650 mg Rectal Given 02/03/18 0040)  vancomycin (VANCOCIN) 1,500 mg in sodium chloride 0.9 % 500 mL IVPB (0 mg Intravenous Stopped 02/03/18 0217)     Initial Impression / Assessment and Plan / ED Course  I have reviewed the triage vital  signs and the nursing notes.  Pertinent labs & imaging results that were available during my care of the patient were reviewed by me and considered in my medical decision making (see chart for details).     Patient presents with fever and confusion.  Found to be febrile to 103.  Sepsis workup initiated.  She is otherwise fairly tachycardic.  Not hypotensive.  She is confused but nonfocal.  Mild tenderness to the abdomen.  Actively vomiting.  Patient given fluids.  She was already given Zofran by EMS.  30 cc/kg fluids ordered.  She was covered broadly with antibiotics.  Unknown source.  Presume urinary also may be upper respiratory given time of year.  Influenza screen negative.  She does have a leukocytosis to 20.  Urinalysis without obvious infection.  CT scan of the abdomen obtained given abdominal pain.  This is largely unrevealing with the exception of possible infiltrate of the right lower lung base.  Patient was actively vomiting and is also at risk for aspiration.  On recheck, heart rate has improved to 102.  Temperature is now 100.5.  Plan for admission to the hospitalist for further workup.  Unclear source of sepsis at this time.  Final Clinical Impressions(s) / ED Diagnoses   Final diagnoses:  Severe sepsis ([place])  Confusion  Non-intractable vomiting without nausea, unspecified vomiting type    ED Discharge Orders    None       Merryl Hacker, MD 02/03/18 [phone]

## 2018-02-03 NOTE — Progress Notes (Signed)
[facility] PROGRESS NOTE  SKARLETH DELMONICO WPY:[id number] DOB: 06/05/1932 DOA: 02/02/2018  PCP: Briscoe Deutscher, DO  Brief History/Interval Summary: 84 year old Caucasian female with a past medical history of rheumatoid arthritis on immunosuppressants and chronic steroids, history of chronic diastolic CHF, history of UTI who was hospitalized earlier this month for E. coli UTI and completed course of antibiotics.  Symptomatically the patient improved.  She was feeling better and then over the last 24 hours started developing fever confusion nausea and vomiting.  She was brought back into the hospital.  She was noted to be septic with elevated lactic acid levels.  No clear source of infection was identified except for perhaps a right lower lobe pneumonia noted on CT.  Patient did not have any respiratory symptoms.  She will need hospitalization for further management.  Reason for Visit: Sepsis  Consultants: None  Procedures: None  Antibiotics: Currently on vancomycin, aztreonam and Levaquin  Subjective/Interval History: Patient feels fatigued.  Denies any cough shortness of breath chest pain.  Has not had any nausea vomiting since she has been in.  Denies any diarrhea recently.  No skin rashes.  No joint pains more than usual.  ROS: Denies any headaches  Objective:  Vital Signs  Vitals:   02/03/18 0930 02/03/18 1001 02/03/18 1100 02/03/18 1139  BP: 118/63 118/66 110/64 124/66  Pulse: (!) 103 100 94 94  Resp: 20 (!) 24 20 19   Temp:      TempSrc:      SpO2: 96% 94% 95% 94%    Intake/Output Summary (Last 24 hours) at 02/03/2018 1141 Last data filed at 02/03/2018 0539 Gross per 24 hour  Intake 3750 ml  Output -  Net 3750 ml   There were no vitals filed for this visit.  General appearance: alert, cooperative, appears stated age, distracted and no distress Head: Normocephalic, without obvious abnormality, atraumatic Neck: No obvious lymphadenopathy.  Neck is soft and  supple.  No thyromegaly. Back: symmetric, no curvature. ROM normal. No CVA tenderness. Resp: Diminished air entry at the bases with a few crackles on the right more than left.  No wheezing.  No rhonchi.  Normal effort at rest. Cardio: regular rate and rhythm, S1, S2 normal, no murmur, click, rub or gallop GI: soft, non-tender; bowel sounds normal; no masses,  no organomegaly Extremities: extremities normal, atraumatic, no cyanosis or edema Pulses: 2+ and symmetric Skin: No rashes noted anywhere including the lower back Neurologic: No obvious focal neurological deficits.  Lab Results:  Data Reviewed: I have personally reviewed following labs and imaging studies  CBC: Recent Labs  Lab 02/03/18 0004  WBC 20.5*  NEUTROABS 18.3*  HGB 15.1*  HCT 46.4*  MCV 102.9*  PLT 767    Basic Metabolic Panel: Recent Labs  Lab 02/03/18 0004  NA 141  K 3.4*  CL 97*  CO2 27  GLUCOSE 179*  BUN 31*  CREATININE 1.20*  CALCIUM 10.6*    GFR: Estimated Creatinine Clearance: 35.2 mL/min (A) (by C-G formula based on SCr of 1.2 mg/dL (H)).  Liver Function Tests: Recent Labs  Lab 02/03/18 0004  AST 31  ALT 19  ALKPHOS 54  BILITOT 0.8  PROT 8.3*  ALBUMIN 4.0    Cardiac Enzymes: Recent Labs  Lab 02/03/18 0004  TROPONINI <0.03     Recent Results (from the past 240 hour(s))  Culture, sputum-assessment     Status: None   Collection Time: 02/03/18  9:04 AM  Result Value Ref Range Status  Specimen Description SPUTUM  Final   Special Requests NONE  Final   Sputum evaluation   Final    THIS SPECIMEN IS ACCEPTABLE FOR SPUTUM CULTURE Performed at [facility], [place] [address], [place],  [address]    Report Status 02/03/2018 FINAL  Final      Radiology Studies: Ct Abdomen Pelvis Wo Contrast  Result Date: 02/03/2018 CLINICAL DATA:  Nausea and vomiting EXAM: CT ABDOMEN AND PELVIS WITHOUT CONTRAST TECHNIQUE: Multidetector CT imaging of the abdomen and  pelvis was performed following the standard protocol without IV contrast. COMPARISON:  CT lumbar spine 04/21/2015 FINDINGS: Lower chest: Lung bases demonstrate patchy dependent consolidations. This most likely reflects atelectasis. Mild tree-in-bud densities in the lingula, right middle lobe and bilateral lung bases. No pleural effusion. Normal heart size. Hepatobiliary: No focal liver abnormality is seen. No gallstones, gallbladder wall thickening, or biliary dilatation. Pancreas: Unremarkable. No pancreatic ductal dilatation or surrounding inflammatory changes. Spleen: Normal in size without focal abnormality. Adrenals/Urinary Tract: Adrenal glands are unremarkable. Kidneys are normal, without renal calculi, focal lesion, or hydronephrosis. Bladder is unremarkable. Stomach/Bowel: Stomach is within normal limits. Appendix appears normal. No evidence of bowel wall thickening, distention, or inflammatory changes. Vascular/Lymphatic: Mild aortic atherosclerosis. No aneurysmal dilatation. No significantly enlarged lymph nodes Reproductive: Uterus and bilateral adnexa are unremarkable. Other: Negative for free air or free fluid. Tiny fat in the umbilical region Musculoskeletal: Degenerative changes with interbody devices at L3-L4 and L4-L5. IMPRESSION: 1. No CT evidence for acute intra-abdominal or pelvic abnormality. Negative for bowel obstruction or bowel wall thickening 2. Findings suggesting bronchiolitis or atypical infection at the bilateral lung bases, lingula and right middle lobe. Electronically Signed   By: Donavan Foil M.D.   On: 02/03/2018 02:15   Dg Chest Port 1 View  Result Date: 02/03/2018 CLINICAL DATA:  Sepsis EXAM: PORTABLE CHEST 1 VIEW COMPARISON:  01/19/2018 FINDINGS: Postsurgical changes in the cervical spine. Elevation of left diaphragm with hazy atelectasis at the left base. No consolidation. Stable cardiomediastinal silhouette with atherosclerosis. No pneumothorax. IMPRESSION: No active  disease. Stable elevation of left diaphragm with atelectasis or scarring at the left base Electronically Signed   By: Donavan Foil M.D.   On: 02/03/2018 00:10     Medications:  Scheduled: . atorvastatin  20 mg Oral QHS  . enoxaparin (LOVENOX) injection  40 mg Subcutaneous Q24H  . fluticasone  2 spray Each Nare Daily  . folic acid  1 mg Oral Daily  . hydrocortisone sod succinate (SOLU-CORTEF) inj  100 mg Intravenous Q8H  . Milnacipran  50 mg Oral BID  . pantoprazole  40 mg Oral BID   Continuous: . sodium chloride 100 mL/hr (02/03/18 0858)  . aztreonam Stopped (02/03/18 1856)  . [START ON 02/04/2018] levofloxacin (LEVAQUIN) IV    . [START ON 02/05/2018] vancomycin     PRN:  Assessment/Plan:  Principal Problem:   Severe sepsis ([place]) Active Problems:   Rheumatoid arthritis ([place])   Long term current use of systemic steroids   HCAP (healthcare-associated pneumonia)   Nausea and vomiting    Severe sepsis No clear source identified as yet.  CT scan does suggest atypical infection in the lung bases.  However patient does not have any respiratory symptoms.  No rashes identified.  Mental status appears to be close to baseline.  Neck is soft and supple.  Her WBC is significantly elevated.  UA does not show any infection.  Follow-up on cultures.  Pro-calcitonin level noted to be 3.  Lactic acid level has improved with IV hydration.  We will continue broad-spectrum antibiotics with vancomycin and aztreonam and Levaquin for now.  De-escalate antibiotics based on culture data.  Influenza PCR negative.  Strep pneumo urinary antigen negative.  Long-term steroid use for rheumatoid arthritis Patient's blood pressure noted to be borderline low.  We will give her high dose stress steroids for 24 hours.  Initiate hydrocortisone.  History of rheumatoid arthritis On steroids on a chronic basis.  She is also on methotrexate and Plaquenil which are being held due to severe sepsis.  Nausea and  vomiting No clear etiology identified for same.  LFTs are normal.  CT abdomen pelvis did not show any acute process.  She reports no diarrhea.  Continue to monitor for now.  Mild acute renal failure Creatinine noted to be slightly higher than her baseline.  Probably due to hypovolemia.  Continue IV fluids.  Monitor urine output.  Repeat labs tomorrow.  DVT Prophylaxis: Lovenox    Code Status: Full code Family Communication: Discussed with the patient Disposition Plan: Management as outlined above.    LOS: 0 days   [facility] Pager [phone] 02/03/2018, 11:41 AM  If 7PM-7AM, please contact night-coverage at [email or web address], password [facility]

## 2018-02-04 DIAGNOSIS — E876 Hypokalemia: Secondary | ICD-10-CM

## 2018-02-04 LAB — COMPREHENSIVE METABOLIC PANEL
ALT: 17 U/L (ref 14–54)
AST: 22 U/L (ref 15–41)
Albumin: 2.5 g/dL — ABNORMAL LOW (ref 3.5–5.0)
Alkaline Phosphatase: 30 U/L — ABNORMAL LOW (ref 38–126)
Anion gap: 11 (ref 5–15)
BUN: 16 mg/dL (ref 6–20)
CO2: 23 mmol/L (ref 22–32)
Calcium: 8 mg/dL — ABNORMAL LOW (ref 8.9–10.3)
Chloride: 110 mmol/L (ref 101–111)
Creatinine, Ser: 0.75 mg/dL (ref 0.44–1.00)
GFR calc Af Amer: >60 mL/min (ref >60)
GFR calc non Af Amer: >60 mL/min (ref >60)
Glucose, Bld: 119 mg/dL — ABNORMAL HIGH (ref 65–99)
Potassium: 2.4 mmol/L — CL (ref 3.5–5.1)
Sodium: 144 mmol/L (ref 135–145)
Total Bilirubin: 0.6 mg/dL (ref 0.3–1.2)
Total Protein: 5.6 g/dL — ABNORMAL LOW (ref 6.5–8.1)

## 2018-02-04 LAB — BASIC METABOLIC PANEL
Anion gap: 8 (ref 5–15)
BUN: 12 mg/dL (ref 6–20)
CO2: 21 mmol/L — ABNORMAL LOW (ref 22–32)
Calcium: 7.8 mg/dL — ABNORMAL LOW (ref 8.9–10.3)
Chloride: 111 mmol/L (ref 101–111)
Creatinine, Ser: 0.88 mg/dL (ref 0.44–1.00)
GFR calc Af Amer: >60 mL/min (ref >60)
GFR calc non Af Amer: 59 mL/min — ABNORMAL LOW (ref >60)
Glucose, Bld: 165 mg/dL — ABNORMAL HIGH (ref 65–99)
Potassium: 3.6 mmol/L (ref 3.5–5.1)
Sodium: 140 mmol/L (ref 135–145)

## 2018-02-04 LAB — CBC
HCT: 33 % — ABNORMAL LOW (ref 36.0–46.0)
Hemoglobin: 10.5 g/dL — ABNORMAL LOW (ref 12.0–15.0)
MCH: 33.1 pg (ref 26.0–34.0)
MCHC: 31.8 g/dL (ref 30.0–36.0)
MCV: 104.1 fL — ABNORMAL HIGH (ref 78.0–100.0)
Platelets: 252 10*3/uL (ref 150–400)
RBC: 3.17 MIL/uL — ABNORMAL LOW (ref 3.87–5.11)
RDW: 15.5 % (ref 11.5–15.5)
WBC: 10.3 10*3/uL (ref 4.0–10.5)

## 2018-02-04 LAB — HIV ANTIBODY (ROUTINE TESTING W REFLEX): HIV Screen 4th Generation wRfx: NONREACTIVE

## 2018-02-04 LAB — MAGNESIUM: Magnesium: 1.4 mg/dL — ABNORMAL LOW (ref 1.7–2.4)

## 2018-02-04 LAB — PROCALCITONIN: Procalcitonin: 2.28 ng/mL

## 2018-02-04 MED ORDER — HYDROCORTISONE NA SUCCINATE PF 100 MG IJ SOLR
50.0000 mg | Freq: Three times a day (TID) | INTRAMUSCULAR | Status: DC
  Administered 2018-02-04 – 2018-02-05 (×3): 50 mg via INTRAVENOUS
  Filled 2018-02-04 (×4): qty 2

## 2018-02-04 MED ORDER — HYDROCODONE-ACETAMINOPHEN 5-325 MG PO TABS
1.0000 | ORAL_TABLET | Freq: Four times a day (QID) | ORAL | Status: DC | PRN
  Administered 2018-02-04 – 2018-02-05 (×2): 1 via ORAL
  Filled 2018-02-04 (×2): qty 1

## 2018-02-04 MED ORDER — ENSURE ENLIVE PO LIQD
237.0000 mL | Freq: Two times a day (BID) | ORAL | Status: DC
  Administered 2018-02-04 – 2018-02-05 (×2): 237 mL via ORAL

## 2018-02-04 MED ORDER — POTASSIUM CHLORIDE 10 MEQ/100ML IV SOLN
10.0000 meq | INTRAVENOUS | Status: AC
  Administered 2018-02-04 (×4): 10 meq via INTRAVENOUS
  Filled 2018-02-04 (×4): qty 100

## 2018-02-04 MED ORDER — POTASSIUM CHLORIDE CRYS ER 20 MEQ PO TBCR
40.0000 meq | EXTENDED_RELEASE_TABLET | ORAL | Status: AC
Start: 2018-02-04 — End: 2018-02-04
  Administered 2018-02-04 (×3): 40 meq via ORAL
  Filled 2018-02-04 (×3): qty 2

## 2018-02-04 MED ORDER — VANCOMYCIN HCL 10 G IV SOLR
1500.0000 mg | INTRAVENOUS | Status: DC
  Administered 2018-02-05: 1500 mg via INTRAVENOUS
  Filled 2018-02-04: qty 1500

## 2018-02-04 MED ORDER — MAGNESIUM SULFATE 2 GM/50ML IV SOLN
2.0000 g | Freq: Once | INTRAVENOUS | Status: AC
  Administered 2018-02-04: 2 g via INTRAVENOUS
  Filled 2018-02-04: qty 50

## 2018-02-04 MED ORDER — SODIUM CHLORIDE 0.9 % IV SOLN
2.0000 g | Freq: Two times a day (BID) | INTRAVENOUS | Status: DC
  Administered 2018-02-04 – 2018-02-05 (×2): 2 g via INTRAVENOUS
  Filled 2018-02-04 (×3): qty 2

## 2018-02-04 NOTE — Progress Notes (Signed)
Initial Nutrition Assessment  DOCUMENTATION CODES:   Obesity unspecified  INTERVENTION:   Ensure Enlive po BID, each supplement provides 350 kcal and 20 grams of protein  Monitor magnesium, potassium, and phosphorus daily for at least 3 days, MD to replete as needed.   NUTRITION DIAGNOSIS:   Inadequate oral intake related to vomiting, nausea as evidenced by per patient/family report.  GOAL:   Patient will meet greater than or equal to 90% of their needs  MONITOR:   PO intake, Supplement acceptance, Weight trends, Labs, Diet advancement  REASON FOR ASSESSMENT:   Malnutrition Screening Tool    ASSESSMENT:   Pt with PMH significant for osteoporosis, RA on immunosuppressants, and CHF. Presents this admission with confusion, nausea, and vomiting. Admitted for severe sepsis possibly due to atypical PNA/ bronchiolitis seen on CT scan.    Spoke with pt at bedside. Reports having poor appetite since October due to recent passing of her husband. States she typically eats three times per day but of smaller portions. Pt began having persistent vomiting two days ago causing a decrease in PO intake to bites and sips. Pt placed on clear liquid diet, RD observed tray at bedside. Pt ate 100% of her italian ice but did not care for anything else. Diet advanced to soft this afternoon. Pt would like to try Ensure this hospital stay.   Pt endorses a UBW of 175 lb. Weight noted to fluctuate between 170-174 lb over the last six months. Do not suspect any significant weight loss. Nutrition-Focused physical exam completed.   Medications reviewed and include: folic acid, solucortef, 40 mEq KCl, IV abx, 2 g Mg sulfate Labs reviewed: K 2.4 (L) Mg 1.4 (L)   NUTRITION - FOCUSED PHYSICAL EXAM:    Most Recent Value  Orbital Region  No depletion  Upper Arm Region  No depletion  Thoracic and Lumbar Region  Unable to assess  Buccal Region  No depletion  Temple Region  No depletion  Clavicle Bone Region   No depletion  Clavicle and Acromion Bone Region  No depletion  Scapular Bone Region  Unable to assess  Dorsal Hand  No depletion  Patellar Region  No depletion  Anterior Thigh Region  No depletion  Posterior Calf Region  No depletion  Edema (RD Assessment)  Mild     Diet Order:  DIET SOFT Room service appropriate? Yes; Fluid consistency: Thin  EDUCATION NEEDS:   Education needs have been addressed  Skin:  Skin Assessment: Reviewed RN Assessment  Last BM:  02/03/18  Height:   Ht Readings from Last 1 Encounters:  02/03/18 5\' 3"  (1.6 m)    Weight:   Wt Readings from Last 1 Encounters:  02/03/18 170 lb 13.7 oz (77.5 kg)    Ideal Body Weight:  52.3 kg  BMI:  Body mass index is 30.27 kg/m.  Estimated Nutritional Needs:   Kcal:  1600-1800 kcal/day  Protein:  80-90 g/day  Fluid:  >1.6 L/day    Mariana Single RD, LDN Clinical Nutrition Pager # [phone]

## 2018-02-04 NOTE — Progress Notes (Signed)
CRITICAL VALUE ALERT  Critical Value:  K. 2.4  Date & Time Notied:  02/04/18@0552   Provider Notified: Yes  Orders Received/Actions taken: New order received

## 2018-02-04 NOTE — Progress Notes (Signed)
[facility] PROGRESS NOTE  Eileen Wilkerson JKK:[id number] DOB: 1932-09-25 DOA: 02/02/2018  PCP: Briscoe Deutscher, DO  Brief History/Interval Summary: 84 year old Caucasian female with a past medical history of rheumatoid arthritis on immunosuppressants and chronic steroids, history of chronic diastolic CHF, history of UTI who was hospitalized earlier this month for E. coli UTI and completed course of antibiotics.  Symptomatically the patient improved.  She was feeling better and then over the last 24 hours started developing fever confusion nausea and vomiting.  She was brought back into the hospital.  She was noted to be septic with elevated lactic acid levels.  No clear source of infection was identified except for perhaps a right lower lobe pneumonia noted on CT.  Patient did not have any respiratory symptoms.  She will need hospitalization for further management.  Reason for Visit: Sepsis  Consultants: None  Procedures: None  Antibiotics: Initiated on vancomycin, aztreonam and Levaquin Aztreonam changed over to cefepime today  Subjective/Interval History: Patient feels well.  Does not report any nausea vomiting.  No cough.  She does have a history of back pain ever since 2015 when she had a fall.  Complains of some discomfort in her back due to position of the bed.  Otherwise no other complaints.    ROS: Denies any headaches  Objective:  Vital Signs  Vitals:   02/03/18 1400 02/03/18 1433 02/03/18 2126 02/04/18 0504  BP: (!) 103/57 (!) 102/57 111/61 123/62  Pulse: 88 85 76 82  Resp: 19 20 18 18   Temp:  98.2 F (36.8 C) 97.7 F (36.5 C) 98.4 F (36.9 C)  TempSrc:  Oral Oral Oral  SpO2: 95% 97% 100% 100%  Weight:  77.5 kg (170 lb 13.7 oz)    Height:  5\' 3"  (1.6 m)      Intake/Output Summary (Last 24 hours) at 02/04/2018 0907 Last data filed at 02/04/2018 7169 Gross per 24 hour  Intake 3000.42 ml  Output 1200 ml  Net 1800.42 ml   Filed Weights   02/03/18 1433   Weight: 77.5 kg (170 lb 13.7 oz)    General appearance: Awake alert.  In no distress. Back: symmetric, no curvature. ROM normal. No CVA tenderness. Resp: Few crackles in the right base.  No wheezing no rhonchi.  Normal effort at rest.   Cardio: S1-S2 is normal regular.  No S3-S4.  No rubs murmurs or bruit GI: Abdomen soft.  Nontender nondistended.  Bowel sounds are present.  No masses organomegaly. Extremities: No edema.  Able to lift both her legs off the bed. Skin: No rashes noted anywhere including the lower back Neurologic: Oriented x3.  No focal neurological deficits.  Lab Results:  Data Reviewed: I have personally reviewed following labs and imaging studies  CBC: Recent Labs  Lab 02/03/18 0004 02/04/18 0446  WBC 20.5* 10.3  NEUTROABS 18.3*  --   HGB 15.1* 10.5*  HCT 46.4* 33.0*  MCV 102.9* 104.1*  PLT 364 678    Basic Metabolic Panel: Recent Labs  Lab 02/03/18 0004 02/04/18 0446  NA 141 144  K 3.4* 2.4*  CL 97* 110  CO2 27 23  GLUCOSE 179* 119*  BUN 31* 16  CREATININE 1.20* 0.75  CALCIUM 10.6* 8.0*  MG  --  1.4*    GFR: Estimated Creatinine Clearance: 52.5 mL/min (by C-G formula based on SCr of 0.75 mg/dL).  Liver Function Tests: Recent Labs  Lab 02/03/18 0004 02/04/18 0446  AST 31 22  ALT 19 17  ALKPHOS  54 30*  BILITOT 0.8 0.6  PROT 8.3* 5.6*  ALBUMIN 4.0 2.5*    Cardiac Enzymes: Recent Labs  Lab 02/03/18 0004  TROPONINI <0.03     Recent Results (from the past 240 hour(s))  Culture, sputum-assessment     Status: None   Collection Time: 02/03/18  9:04 AM  Result Value Ref Range Status   Specimen Description SPUTUM  Final   Special Requests NONE  Final   Sputum evaluation   Final    THIS SPECIMEN IS ACCEPTABLE FOR SPUTUM CULTURE Performed at [facility], [place] [address], [place], [place] [address]    Report Status 02/03/2018 FINAL  Final  Culture, respiratory (NON-Expectorated)     Status: None (Preliminary  result)   Collection Time: 02/03/18  9:04 AM  Result Value Ref Range Status   Specimen Description   Final    SPUTUM Performed at [facility] [address], [place], [place] [address]    Special Requests   Final    NONE Reflexed from [id number] Performed at [facility], [place] [address] [place], [place] [address]    Gram Stain   Final    MODERATE WBC PRESENT, PREDOMINANTLY MONONUCLEAR MODERATE SQUAMOUS EPITHELIAL CELLS PRESENT MODERATE GRAM POSITIVE RODS MODERATE GRAM POSITIVE COCCI IN PAIRS IN CLUSTERS FEW GRAM NEGATIVE COCCI IN PAIRS RARE GRAM NEGATIVE RODS Performed at [facility], [place] [address] [place], [place] [address]    Culture PENDING  Incomplete   Report Status PENDING  Incomplete  MRSA PCR Screening     Status: Abnormal   Collection Time: 02/03/18 12:33 PM  Result Value Ref Range Status   MRSA by PCR POSITIVE (A) NEGATIVE Final    Comment:        The GeneXpert MRSA Assay (FDA approved for NASAL specimens only), is one component of a comprehensive MRSA colonization surveillance program. It is not intended to diagnose MRSA infection nor to guide or monitor treatment for MRSA infections. RESULT CALLED TO, READ BACK BY AND VERIFIED WITH: K.SEAY,RN 02/03/18 @1648  BY V.WILKINS Performed at [facility], [place] [address], [place], [place] [address]       Radiology Studies: Ct Abdomen Pelvis Wo Contrast  Result Date: 02/03/2018 CLINICAL DATA:  Nausea and vomiting EXAM: CT ABDOMEN AND PELVIS WITHOUT CONTRAST TECHNIQUE: Multidetector CT imaging of the abdomen and pelvis was performed following the standard protocol without IV contrast. COMPARISON:  CT lumbar spine 04/21/2015 FINDINGS: Lower chest: Lung bases demonstrate patchy dependent consolidations. This most likely reflects atelectasis. Mild tree-in-bud densities in the lingula, right middle lobe and bilateral lung bases. No pleural effusion. Normal heart  size. Hepatobiliary: No focal liver abnormality is seen. No gallstones, gallbladder wall thickening, or biliary dilatation. Pancreas: Unremarkable. No pancreatic ductal dilatation or surrounding inflammatory changes. Spleen: Normal in size without focal abnormality. Adrenals/Urinary Tract: Adrenal glands are unremarkable. Kidneys are normal, without renal calculi, focal lesion, or hydronephrosis. Bladder is unremarkable. Stomach/Bowel: Stomach is within normal limits. Appendix appears normal. No evidence of bowel wall thickening, distention, or inflammatory changes. Vascular/Lymphatic: Mild aortic atherosclerosis. No aneurysmal dilatation. No significantly enlarged lymph nodes Reproductive: Uterus and bilateral adnexa are unremarkable. Other: Negative for free air or free fluid. Tiny fat in the umbilical region Musculoskeletal: Degenerative changes with interbody devices at L3-L4 and L4-L5. IMPRESSION: 1. No CT evidence for acute intra-abdominal or pelvic abnormality. Negative for bowel obstruction or bowel wall thickening 2. Findings suggesting bronchiolitis or atypical infection at the  bilateral lung bases, lingula and right middle lobe. Electronically Signed   By: Donavan Foil M.D.   On: 02/03/2018 02:15   Dg Chest Port 1 View  Result Date: 02/03/2018 CLINICAL DATA:  Sepsis EXAM: PORTABLE CHEST 1 VIEW COMPARISON:  01/19/2018 FINDINGS: Postsurgical changes in the cervical spine. Elevation of left diaphragm with hazy atelectasis at the left base. No consolidation. Stable cardiomediastinal silhouette with atherosclerosis. No pneumothorax. IMPRESSION: No active disease. Stable elevation of left diaphragm with atelectasis or scarring at the left base Electronically Signed   By: Donavan Foil M.D.   On: 02/03/2018 00:10     Medications:  Scheduled: . atorvastatin  20 mg Oral QHS  . Chlorhexidine Gluconate Cloth  6 each Topical Q0600  . enoxaparin (LOVENOX) injection  40 mg Subcutaneous Q24H  . fluticasone   2 spray Each Nare Daily  . folic acid  1 mg Oral Daily  . hydrocortisone sod succinate (SOLU-CORTEF) inj  50 mg Intravenous Q8H  . Milnacipran  50 mg Oral BID  . mupirocin ointment  1 application Nasal BID  . pantoprazole  40 mg Oral BID  . potassium chloride  40 mEq Oral Q4H   Continuous: . sodium chloride 100 mL/hr at 02/04/18 0056  . levofloxacin (LEVAQUIN) IV    . potassium chloride 10 mEq (02/04/18 0800)  . [START ON 02/05/2018] vancomycin     PRN:  Assessment/Plan:  Principal Problem:   Severe sepsis (HCC) Active Problems:   Rheumatoid arthritis ([place])   Long term current use of systemic steroids   HCAP (healthcare-associated pneumonia)   Nausea and vomiting    Severe sepsis/possible atypical pneumonia No clear source has been identified yet except for the abnormalities noted in the right lung.  Blood cultures negative so far.  Sputum culture pending.  Due to unclear source patient was started on broad-spectrum antibiotics with vancomycin and aztreonam and Levaquin.  Aztreonam changed over to cefepime since patient has tolerated cephalosporins previously.  We will leave her on Levaquin for atypical coverage.  Lactic acid level has improved.  Pro-calcitonin level improved from 3.7-2.28 today.  WBC is normal today.  No diarrhea. UA does not show any infection. Influenza PCR negative.  Strep pneumo urinary antigen negative.  De-escalate antibiotics in the next 24 hours if patient's cultures remain negative.  Long-term steroid use for rheumatoid arthritis Patient's blood pressure noted to be borderline low.  Patient was given stress dose steroids.  Start tapering hydrocortisone.   History of rheumatoid arthritis On steroids on a chronic basis.  She is also on methotrexate and Plaquenil which are being held due to severe sepsis.  Nausea and vomiting No clear etiology identified for same.  LFTs are normal.  CT abdomen pelvis did not show any acute process.  She reports no  diarrhea.  Continue to monitor.  Advance diet.  Mild acute renal failure Creatinine was noted to be slightly higher than her baseline.  Most likely due to hypovolemia.  She was given IV fluids.  Renal function is now back to baseline.  Monitor urine output.    Hypokalemia and hypomagnesemia Severe hypokalemia noted today.  This will be aggressively repleted.  She will also be given magnesium.  Repeat labs later today.  DVT Prophylaxis: Lovenox    Code Status: Full code Family Communication: Discussed with the patient Disposition Plan: Management as outlined above.  PT and OT evaluation.    LOS: 1 day   [facility] Pager [phone] 02/04/2018, 9:07 AM  If 7PM-7AM, please contact night-coverage at [email or web address], password [email or web address]   

## 2018-02-04 NOTE — Plan of Care (Signed)
  Nutrition: Adequate nutrition will be maintained 02/04/2018 0801 - Progressing by Dorene Sorrow, RN   Pain Managment: General experience of comfort will improve 02/04/2018 0801 - Progressing by Dorene Sorrow, RN   Safety: Ability to remain free from injury will improve 02/04/2018 0801 - Progressing by Dorene Sorrow, RN

## 2018-02-04 NOTE — Progress Notes (Addendum)
Pharmacy Antibiotic Note  Eileen Wilkerson is a 84 y.o. female admitted on 02/02/2018 with sepsis.  Pharmacy has been consulted for azactam, levaquin and vancomycin dosing. On 2/25, noted that patient's has tolerated cephalosporins in past so aztreonam is being changed to cefepime and vanc and Levaquin are being continued for now  Plan: 1) Cefepime 2g IV q12 2) Due to improved renal function, will change vanc from 1g q48 to 1500mg  q48 - goal AUC still 400-500 3) Recommend to discontinue Levaquin as well - double gram negative coverage is not necessary   Height: 5\' 3"  (160 cm) Weight: 170 lb 13.7 oz (77.5 kg) IBW/kg (Calculated) : 52.4  Temp (24hrs), Avg:98.1 F (36.7 C), Min:97.7 F (36.5 C), Max:98.4 F (36.9 C)  Recent Labs  Lab 02/03/18 0004 02/03/18 0017 02/03/18 0148 02/03/18 0904 02/04/18 0446  WBC 20.5*  --   --   --  10.3  CREATININE 1.20*  --   --   --  0.75  LATICACIDVEN  --  3.05* 3.00* 1.7  --     Estimated Creatinine Clearance: 52.5 mL/min (by C-G formula based on SCr of 0.75 mg/dL).    Allergies  Allergen Reactions  . Adhesive [Tape] Rash  . Celebrex [Celecoxib] Hives  . Ciprofibrate Nausea Only  . Cymbalta [Duloxetine Hcl] Swelling  . Gabitril [Tiagabine] Swelling  . Keflex [Cephalexin] Nausea And Vomiting  . Lyrica [Pregabalin] Swelling  . Neurontin [Gabapentin] Swelling  . Nexium [Esomeprazole] Rash  . Nsaids Rash  . Penicillins Rash    Injection site reaction. Tolerated cefepime in past Has patient had a PCN reaction causing immediate rash, facial/tongue/throat swelling, SOB or lightheadedness with hypotension: No Has patient had a PCN reaction causing severe rash involving mucus membranes or skin necrosis: No Has patient had a PCN reaction that required hospitalization No Has patient had a PCN reaction occurring within the last 10 years: No If all of the above answers are "NO", then may proceed with Cephalosporin use.   . Shrimp [Shellfish  Allergy] Anaphylaxis    Per patient "shrimp only"  . Sulfa Antibiotics Nausea And Vomiting  . Ciprofloxacin Other (See Comments)    dizziness  . Claritin-D 12 Hour [Loratadine-Pseudoephedrine Er] Anxiety    Thank you for allowing pharmacy to be a part of this patient's care.   Adrian Saran, PharmD, BCPS Pager [phone] 02/04/2018 10:08 AM

## 2018-02-05 ENCOUNTER — Inpatient Hospital Stay: Payer: Medicare Other | Admitting: Family Medicine

## 2018-02-05 DIAGNOSIS — D539 Nutritional anemia, unspecified: Secondary | ICD-10-CM

## 2018-02-05 LAB — BASIC METABOLIC PANEL
Anion gap: 8 (ref 5–15)
BUN: 13 mg/dL (ref 6–20)
CO2: 21 mmol/L — ABNORMAL LOW (ref 22–32)
Calcium: 7.8 mg/dL — ABNORMAL LOW (ref 8.9–10.3)
Chloride: 112 mmol/L — ABNORMAL HIGH (ref 101–111)
Creatinine, Ser: 0.75 mg/dL (ref 0.44–1.00)
GFR calc Af Amer: >60 mL/min (ref >60)
GFR calc non Af Amer: >60 mL/min (ref >60)
Glucose, Bld: 113 mg/dL — ABNORMAL HIGH (ref 65–99)
Potassium: 3.4 mmol/L — ABNORMAL LOW (ref 3.5–5.1)
Sodium: 141 mmol/L (ref 135–145)

## 2018-02-05 LAB — CBC
HCT: 30.3 % — ABNORMAL LOW (ref 36.0–46.0)
Hemoglobin: 9.6 g/dL — ABNORMAL LOW (ref 12.0–15.0)
MCH: 32.1 pg (ref 26.0–34.0)
MCHC: 31.7 g/dL (ref 30.0–36.0)
MCV: 101.3 fL — ABNORMAL HIGH (ref 78.0–100.0)
Platelets: 158 10*3/uL (ref 150–400)
RBC: 2.99 MIL/uL — ABNORMAL LOW (ref 3.87–5.11)
RDW: 15.1 % (ref 11.5–15.5)
WBC: 9.7 10*3/uL (ref 4.0–10.5)

## 2018-02-05 LAB — PROCALCITONIN: Procalcitonin: 1.3 ng/mL

## 2018-02-05 LAB — MAGNESIUM: Magnesium: 2 mg/dL (ref 1.7–2.4)

## 2018-02-05 MED ORDER — PREDNISONE 20 MG PO TABS
40.0000 mg | ORAL_TABLET | Freq: Two times a day (BID) | ORAL | Status: DC
  Administered 2018-02-05 – 2018-02-06 (×2): 40 mg via ORAL
  Filled 2018-02-05 (×2): qty 2

## 2018-02-05 MED ORDER — POTASSIUM CHLORIDE CRYS ER 20 MEQ PO TBCR
40.0000 meq | EXTENDED_RELEASE_TABLET | Freq: Once | ORAL | Status: AC
  Administered 2018-02-05: 40 meq via ORAL
  Filled 2018-02-05: qty 2

## 2018-02-05 MED ORDER — LEVOFLOXACIN 750 MG PO TABS: 750.0000 mg | ORAL_TABLET | ORAL | Status: DC

## 2018-02-05 MED ORDER — LEVOFLOXACIN 750 MG PO TABS
750.0000 mg | ORAL_TABLET | Freq: Every day | ORAL | Status: DC
  Administered 2018-02-05: 750 mg via ORAL
  Filled 2018-02-05: qty 1

## 2018-02-05 NOTE — Evaluation (Signed)
Occupational Therapy Evaluation Patient Details Name: Eileen Wilkerson MRN: [id number] DOB: 03-28-1932 Today's Date: 02/05/2018    History of Present Illness 84 year old Caucasian female with a past medical history of rheumatoid arthritis on immunosuppressants and chronic steroids, history of chronic diastolic CHF, history of UTI who was hospitalized earlier this month for E. coli UTI and completed course of antibiotics.   She was feeling better and then started developing fever confusion nausea and vomiting. Dx of sepsis, PNA.    Clinical Impression   This 84 y/o F presents with the above. Pt presenting with generalized weakness and decreased dynamic balance. Pt lives alone, has a home care aide who assists 12 hrs/day, 7 days/wk. Pt completing room level functional mobility at RW level with MinA this session; currently requires MaxA for LB ADLs. Pt will benefit from continued acute OT services and recommend [place] services after discharge to maximize her overall safety and independence with ADLs and mobility.     Follow Up Recommendations  Home health OT    Equipment Recommendations  None recommended by OT           Precautions / Restrictions Precautions Precautions: Fall Restrictions Weight Bearing Restrictions: No      Mobility Bed Mobility Overal bed mobility: Needs Assistance Bed Mobility: Supine to Sit     Supine to sit: HOB elevated;Supervision Sit to supine: Min assist   General bed mobility comments: min A for LEs into bed  Transfers Overall transfer level: Needs assistance Equipment used: Rolling walker (2 wheeled) Transfers: Sit to/from Stand Sit to Stand: Min assist         General transfer comment: increased time and assist to power up, VCs hand placement    Balance Overall balance assessment: Needs assistance Sitting-balance support: No upper extremity supported;Feet supported Sitting balance-Leahy Scale: Good     Standing balance support:  Bilateral upper extremity supported Standing balance-Leahy Scale: Poor                             ADL either performed or assessed with clinical judgement   ADL Overall ADL's : Needs assistance/impaired Eating/Feeding: Set up;Sitting Eating/Feeding Details (indicate cue type and reason): to open containers  Grooming: Set up;Sitting   Upper Body Bathing: Min guard;Sitting   Lower Body Bathing: Minimal assistance;Sit to/from stand   Upper Body Dressing : Min guard;Sitting   Lower Body Dressing: Moderate assistance;Sit to/from stand   Toilet Transfer: Minimal assistance;Ambulation;RW;Regular Glass blower/designer Details (indicate cue type and reason): simulated in transfer to recliner  Toileting- Clothing Manipulation and Hygiene: Minimal assistance;Sit to/from stand       Functional mobility during ADLs: Minimal assistance;Rolling walker                           Pertinent Vitals/Pain Pain Assessment: No/denies pain          Extremity/Trunk Assessment Upper Extremity Assessment Upper Extremity Assessment: Generalized weakness   Lower Extremity Assessment Lower Extremity Assessment: Defer to PT evaluation   Cervical / Trunk Assessment Cervical / Trunk Assessment: Normal   Communication Communication Communication: No difficulties   Cognition Arousal/Alertness: Awake/alert Behavior During Therapy: WFL for tasks assessed/performed Overall Cognitive Status: Within Functional Limits for tasks assessed  Home Living Family/patient expects to be discharged to:: Private residence Living Arrangements: Alone Available Help at Discharge: Family;Personal care attendant(private aide 9am-9pm/7 days a week) Type of Home: House Home Access: [place]: One level     Bathroom Shower/Tub: Occupational psychologist: [place]:  Bedside commode;Cane - single point;Walker - 2 wheels   Additional Comments: aides come 12 hours/day, 7 days/week      Prior Functioning/Environment Level of Independence: Independent with assistive device(s)        Comments: daughter assists with getting groceries and driving, had HHPT prior to this admission        OT Problem List: Decreased strength;Decreased activity tolerance;Impaired balance (sitting and/or standing)      OT Treatment/Interventions: Self-care/ADL training;DME and/or AE instruction;Therapeutic activities;Balance training;Therapeutic exercise;Energy conservation;Patient/family education    OT Goals(Current goals can be found in the care plan section) Acute Rehab OT Goals Patient Stated Goal: to get stronger OT Goal Formulation: With patient Time For Goal Achievement: 02/19/18 Potential to Achieve Goals: Good  OT Frequency: Min 2X/week                             AM-PAC PT "6 Clicks" Daily Activity     Outcome Measure Help from another person eating meals?: A Little Help from another person taking care of personal grooming?: A Little Help from another person toileting, which includes using toliet, bedpan, or urinal?: A Little Help from another person bathing (including washing, rinsing, drying)?: A Lot Help from another person to put on and taking off regular upper body clothing?: A Little Help from another person to put on and taking off regular lower body clothing?: A Lot 6 Click Score: 16   End of Session Equipment Utilized During Treatment: Gait belt;Rolling walker Nurse Communication: Mobility status  Activity Tolerance: Patient tolerated treatment well Patient left: in chair;with call bell/phone within reach;with chair alarm set  OT Visit Diagnosis: Unsteadiness on feet (R26.81);Muscle weakness (generalized) (M62.81)                Time: 1410-1436 OT Time Calculation (min): 26 min Charges:  OT General Charges $OT Visit: 1 Visit OT  Evaluation $OT Eval Moderate Complexity: 1 Mod OT Treatments $Self Care/Home Management : 8-22 mins G-Codes:     Lou Cal, OT Pager [phone] 02/05/2018   Eileen Wilkerson 02/05/2018, 4:58 PM

## 2018-02-05 NOTE — Evaluation (Signed)
Physical Therapy Evaluation Patient Details Name: Eileen Wilkerson MRN: [id number] DOB: 10/28/1932 Today's Date: 02/05/2018   History of Present Illness  84 year old Caucasian female with a past medical history of rheumatoid arthritis on immunosuppressants and chronic steroids, history of chronic diastolic CHF, history of UTI who was hospitalized earlier this month for E. coli UTI and completed course of antibiotics.   She was feeling better and then started developing fever confusion nausea and vomiting. Dx of sepsis, PNA.   Clinical Impression  Pt admitted with above diagnosis. Pt currently with functional limitations due to the deficits listed below (see PT Problem List). Pt ambulated 13' x 2 with RW, SaO2 97% on room air, HR 113 with activity. HHPT recommended. She will have private aides at home  9am-9pm every day.  Pt will benefit from skilled PT to increase their independence and safety with mobility to allow discharge to the venue listed below.       Follow Up Recommendations Home health PT    Equipment Recommendations  None recommended by PT    Recommendations for Other Services       Precautions / Restrictions Precautions Precautions: Fall Restrictions Weight Bearing Restrictions: No      Mobility  Bed Mobility Overal bed mobility: Needs Assistance Bed Mobility: Supine to Sit     Supine to sit: Modified independent (Device/Increase time) Sit to supine: Min assist   General bed mobility comments: min A for LEs into bed  Transfers Overall transfer level: Needs assistance Equipment used: Rolling walker (2 wheeled) Transfers: Sit to/from Stand Sit to Stand: Min assist         General transfer comment: increased time and assist to power up, VCs hand placement  Ambulation/Gait Ambulation/Gait assistance: Min guard Ambulation Distance (Feet): 26 Feet Assistive device: Rolling walker (2 wheeled) Gait Pattern/deviations: Step-to pattern;Decreased step length -  right;Decreased step length - left;Shuffle Gait velocity: WFL   General Gait Details: 70' x 2 from bed to commode then back to bed, HR 113 max, SaO2 97% on RA, 1/4 dyspnea, distance limited by fatigue  Stairs            Wheelchair Mobility    Modified Rankin (Stroke Patients Only)       Balance Overall balance assessment: Needs assistance Sitting-balance support: No upper extremity supported;Feet supported Sitting balance-Leahy Scale: Good     Standing balance support: Bilateral upper extremity supported Standing balance-Leahy Scale: Poor                               Pertinent Vitals/Pain Pain Assessment: No/denies pain    Home Living Family/patient expects to be discharged to:: Private residence Living Arrangements: Alone Available Help at Discharge: Family;Personal care attendant(private aide 9am-9pm 7 days/week) Type of Home: House Home Access: Ramped entrance     Home Layout: One level Home Equipment: Bedside commode;Cane - single point;Walker - 2 wheels Additional Comments: aides come 12 hours/day, 7 days/week    Prior Function Level of Independence: Independent with assistive device(s)         Comments: daughter assists with getting groceries and driving, had HHPT prior to this admission     Hand Dominance        Extremity/Trunk Assessment   Upper Extremity Assessment Upper Extremity Assessment: Generalized weakness    Lower Extremity Assessment Lower Extremity Assessment: Generalized weakness    Cervical / Trunk Assessment Cervical / Trunk Assessment: Normal  Communication  Communication: No difficulties  Cognition Arousal/Alertness: Awake/alert Behavior During Therapy: WFL for tasks assessed/performed Overall Cognitive Status: Within Functional Limits for tasks assessed                                        General Comments      Exercises     Assessment/Plan    PT Assessment Patient needs  continued PT services  PT Problem List Decreased strength;Decreased activity tolerance;Decreased balance;Decreased mobility;Decreased cognition;Decreased knowledge of use of DME       PT Treatment Interventions DME instruction;Gait training;Functional mobility training;Therapeutic activities;Therapeutic exercise;Patient/family education    PT Goals (Current goals can be found in the Care Plan section)  Acute Rehab PT Goals Patient Stated Goal: to get stronger PT Goal Formulation: With patient Time For Goal Achievement: 02/19/18 Potential to Achieve Goals: Fair    Frequency Min 3X/week   Barriers to discharge        Co-evaluation               AM-PAC PT "6 Clicks" Daily Activity  Outcome Measure Difficulty turning over in bed (including adjusting bedclothes, sheets and blankets)?: A Little Difficulty moving from lying on back to sitting on the side of the bed? : A Little Difficulty sitting down on and standing up from a chair with arms (e.g., wheelchair, bedside commode, etc,.)?: Unable Help needed moving to and from a bed to chair (including a wheelchair)?: A Little Help needed walking in hospital room?: A Little Help needed climbing 3-5 steps with a railing? : A Lot 6 Click Score: 15    End of Session Equipment Utilized During Treatment: Gait belt Activity Tolerance: Patient limited by fatigue Patient left: in bed;with bed alarm set Nurse Communication: Mobility status PT Visit Diagnosis: Unsteadiness on feet (R26.81);Muscle weakness (generalized) (M62.81);Difficulty in walking, not elsewhere classified (R26.2)    Time: 2505-3976 PT Time Calculation (min) (ACUTE ONLY): 22 min   Charges:   PT Evaluation $PT Eval Low Complexity: 1 Low     PT G Codes:          [name] 02/05/2018, 3:52 PM [phone]

## 2018-02-05 NOTE — Progress Notes (Signed)
[facility] PROGRESS NOTE  COLETTE DICAMILLO XBM:[id number] DOB: [birth year] DOA: 02/02/2018  PCP: Briscoe Deutscher, DO  Brief History/Interval Summary: 84 year old Caucasian female with a past medical history of rheumatoid arthritis on immunosuppressants and chronic steroids, history of chronic diastolic CHF, history of UTI who was hospitalized earlier this month for E. coli UTI and completed course of antibiotics.  Symptomatically the patient improved.  She was feeling better and then over the last 24 hours started developing fever confusion nausea and vomiting.  She was brought back into the hospital.  She was noted to be septic with elevated lactic acid levels.  No clear source of infection was identified except for perhaps a right lower lobe pneumonia noted on CT.  Patient did not have any respiratory symptoms.  Cultures have all been negative so far.  Antibiotics to be de-escalated today.  Reason for Visit: Sepsis  Consultants: None  Procedures: None  Antibiotics: Initiated on vancomycin, aztreonam and Levaquin Aztreonam changed over to cefepime 2/25 Discontinue vancomycin and cefepime on 2/26 Change Levaquin to oral on 2/26  Subjective/Interval History: Patient continues to feel well.  Denies any cough.  No further episodes of nausea vomiting.  No diarrhea.  Requesting physical therapy.     ROS: Denies any headaches.  Objective:  Vital Signs  Vitals:   02/04/18 1257 02/04/18 2016 02/05/18 0029 02/05/18 0621  BP:  117/64  (!) 143/82  Pulse:  82  87  Resp:  18  18  Temp: 98.3 F (36.8 C) 98 F (36.7 C) 98.2 F (36.8 C) 98 F (36.7 C)  TempSrc: Oral Oral Oral Oral  SpO2:  100%  97%  Weight:      Height:        Intake/Output Summary (Last 24 hours) at 02/05/2018 0924 Last data filed at 02/05/2018 0272 Gross per 24 hour  Intake 1479.83 ml  Output 1600 ml  Net -120.17 ml   Filed Weights   02/03/18 1433  Weight: 77.5 kg (170 lb 13.7 oz)    General  appearance: Awake alert.  In no distress. Resp: Diminished air entry at the bases.  few crackles but no wheezing or rhonchi. Cardio: S1-S2 is normal regular.  No S3-S4.  No rubs murmurs or bruit GI: Abdomen soft.  Nontender nondistended.  Bowel sounds are present.  No masses organomegaly  Extremities: No edema Neurologic: Oriented x3.  No focal neurological deficits.  Lab Results:  Data Reviewed: I have personally reviewed following labs and imaging studies  CBC: Recent Labs  Lab 02/03/18 0004 02/04/18 0446 02/05/18 0426  WBC 20.5* 10.3 9.7  NEUTROABS 18.3*  --   --   HGB 15.1* 10.5* 9.6*  HCT 46.4* 33.0* 30.3*  MCV 102.9* 104.1* 101.3*  PLT 364 252 536    Basic Metabolic Panel: Recent Labs  Lab 02/03/18 0004 02/04/18 0446 02/04/18 2033 02/05/18 0426  NA 141 144 140 141  K 3.4* 2.4* 3.6 3.4*  CL 97* 110 111 112*  CO2 27 23 21* 21*  GLUCOSE 179* 119* 165* 113*  BUN 31* 16 12 13   CREATININE 1.20* 0.75 0.88 0.75  CALCIUM 10.6* 8.0* 7.8* 7.8*  MG  --  1.4*  --   --     GFR: Estimated Creatinine Clearance: 52.5 mL/min (by C-G formula based on SCr of 0.75 mg/dL).  Liver Function Tests: Recent Labs  Lab 02/03/18 0004 02/04/18 0446  AST 31 22  ALT 19 17  ALKPHOS 54 30*  BILITOT 0.8 0.6  PROT  8.3* 5.6*  ALBUMIN 4.0 2.5*    Cardiac Enzymes: Recent Labs  Lab 02/03/18 0004  TROPONINI <0.03     Recent Results (from the past 240 hour(s))  Blood Culture (routine x 2)     Status: None (Preliminary result)   Collection Time: 02/02/18 11:55 PM  Result Value Ref Range Status   Specimen Description   Final    BLOOD BLOOD LEFT FOREARM Performed at [facility] [address] [place], [place] [address]    Special Requests   Final    IN PEDIATRIC BOTTLE Blood Culture adequate volume Performed at [facility] [address], [place], [place] [address]    Culture   Final    NO GROWTH 1 DAY Performed at [facility], [place] [address] [place], [place] [address]    Report Status PENDING  Incomplete  Blood Culture (routine x 2)     Status: None (Preliminary result)   Collection Time: 02/03/18 12:05 AM  Result Value Ref Range Status   Specimen Description   Final    BLOOD LEFT HAND Performed at [facility] [address] [place], [place] [address]    Special Requests   Final    IN PEDIATRIC BOTTLE Blood Culture adequate volume Performed at [facility] [address], [place], [place] [address]    Culture   Final    NO GROWTH 1 DAY Performed at [facility], [place] [address] [place], [place] [address]    Report Status PENDING  Incomplete  Culture, sputum-assessment     Status: None   Collection Time: 02/03/18  9:04 AM  Result Value Ref Range Status   Specimen Description SPUTUM  Final   Special Requests NONE  Final   Sputum evaluation   Final    THIS SPECIMEN IS ACCEPTABLE FOR SPUTUM CULTURE Performed at [facility], [place] [address] [place], [place] [address]    Report Status 02/03/2018 FINAL  Final  Culture, respiratory (NON-Expectorated)     Status: None (Preliminary result)   Collection Time: 02/03/18  9:04 AM  Result Value Ref Range Status   Specimen Description   Final    SPUTUM Performed at [facility] [address] [place], [place] [address]    Special Requests   Final    NONE Reflexed from [id number] Performed at [facility], [place] [address] [place], [place] [address]    Gram Stain   Final    MODERATE WBC PRESENT, PREDOMINANTLY MONONUCLEAR MODERATE SQUAMOUS EPITHELIAL CELLS PRESENT MODERATE GRAM POSITIVE RODS MODERATE GRAM POSITIVE COCCI IN PAIRS IN CLUSTERS FEW GRAM NEGATIVE COCCI IN PAIRS RARE GRAM NEGATIVE RODS    Culture   Final    CULTURE REINCUBATED FOR BETTER GROWTH Performed at [facility], [place] [address], [place], [place] [address]    Report  Status PENDING  Incomplete  MRSA PCR Screening     Status: Abnormal   Collection Time: 02/03/18 12:33 PM  Result Value Ref Range Status   MRSA by PCR POSITIVE (A) NEGATIVE Final    Comment:        The GeneXpert MRSA Assay (FDA approved for NASAL specimens only), is one component of a comprehensive MRSA colonization surveillance program. It is not intended to diagnose MRSA infection nor to guide or monitor treatment for MRSA infections. RESULT CALLED TO, READ BACK BY AND VERIFIED WITH: K.SEAY,RN 02/03/18 @1648  BY V.WILKINS Performed  at [facility], [place] [address] [place], [place] [address]       Radiology Studies: No results found.   Medications:  Scheduled: . atorvastatin  20 mg Oral QHS  . Chlorhexidine Gluconate Cloth  6 each Topical Q0600  . enoxaparin (LOVENOX) injection  40 mg Subcutaneous Q24H  . feeding supplement (ENSURE ENLIVE)  237 mL Oral BID BM  . fluticasone  2 spray Each Nare Daily  . folic acid  1 mg Oral Daily  . [START ON 02/06/2018] levofloxacin  750 mg Oral Q48H  . Milnacipran  50 mg Oral BID  . mupirocin ointment  1 application Nasal BID  . pantoprazole  40 mg Oral BID  . predniSONE  40 mg Oral BID WC   Continuous: . sodium chloride 30 mL/hr at 02/04/18 1237   PRN:  Assessment/Plan:  Principal Problem:   Severe sepsis ([place]) Active Problems:   Rheumatoid arthritis ([place])   Long term current use of systemic steroids   HCAP (healthcare-associated pneumonia)   Nausea and vomiting    Severe sepsis/possible atypical pneumonia No clear source has been identified yet except for the abnormalities noted in the right lung.  Blood cultures are negative so far.  Sputum cultures pending.  Due to unclear source patient was started on broad-spectrum antibiotics with vancomycin and aztreonam and Levaquin.  Aztreonam changed over to cefepime since patient has tolerated cephalosporins previously.  Since cultures remain negative vancomycin  and cefepime will be discontinued.  Change Levaquin to oral.  Lactic acid level was normal.  Pro-calcitonin level continues to improve.  No diarrhea. UA does not show any infection. Influenza PCR negative.  Strep pneumo urinary antigen negative. WBC is normal.  No new symptoms.    Long-term steroid use for rheumatoid arthritis Patient was given stress dose steroids due to borderline low blood pressures.  She has stabilized.  Change to oral prednisone and taper down to her usual dose.     History of rheumatoid arthritis On steroids on a chronic basis.  She is also on methotrexate and Plaquenil which are being held due to severe sepsis.  Nausea and vomiting No clear etiology identified for same.  LFTs are normal.  CT abdomen pelvis did not show any acute process.  She reports no diarrhea.  Nausea and vomiting has resolved.  Diet has been advanced.  Mild acute renal failure Creatinine was noted to be slightly higher than her baseline.  Most likely due to hypovolemia.  She was given IV fluids.  Renal function is now back to baseline.  Monitor urine output.    Macrocytic anemia Drop in hemoglobin is dilutional.  No overt bleeding.  Check anemia panel  Hypokalemia and hypomagnesemia Potassium level has improved.  Will be repleted again today.  Magnesium was also repleted.  Repeat magnesium level.  DVT Prophylaxis: Lovenox    Code Status: Full code Family Communication: Discussed with the patient Disposition Plan: Management as outlined above.  PT and OT evaluation.    LOS: 2 days   [facility] Pager [phone] 02/05/2018, 9:24 AM  If 7PM-7AM, please contact night-coverage at [email or web address], password [facility]

## 2018-02-05 NOTE — Progress Notes (Signed)
PHARMACY NOTE:  ANTIMICROBIAL RENAL DOSAGE ADJUSTMENT  Current antimicrobial regimen includes a mismatch between antimicrobial dosage and estimated renal function.  As per policy approved by the Pharmacy & Therapeutics and Medical Executive Committees, the antimicrobial dosage will be adjusted accordingly.  Current antimicrobial dosage:  Levaquin 750mg  PO q48  Indication: PNA  Renal Function:  Estimated Creatinine Clearance: 52.5 mL/min (by C-G formula based on SCr of 0.75 mg/dL). []      On intermittent HD, scheduled: []      On CRRT    Antimicrobial dosage has been changed to:  Levaquin 750mg  PO daily  Additional comments:   Thank you for allowing pharmacy to be a part of this patient's care.  Arlyn Dunning [place], [facility] 02/05/2018 10:24 AM

## 2018-02-05 NOTE — Progress Notes (Signed)
Paged MD to make aware of pt's c/o of pain RT: to IV infiltrating and request to not have another IV placed, MD returned call and said it was fine to leave IV out

## 2018-02-06 DIAGNOSIS — R410 Disorientation, unspecified: Secondary | ICD-10-CM

## 2018-02-06 DIAGNOSIS — R1111 Vomiting without nausea: Secondary | ICD-10-CM

## 2018-02-06 LAB — IRON AND TIBC
Iron: 89 ug/dL (ref 28–170)
Saturation Ratios: 32 % — ABNORMAL HIGH (ref 10.4–31.8)
TIBC: 281 ug/dL (ref 250–450)
UIBC: 192 ug/dL

## 2018-02-06 LAB — CBC
HCT: 35 % — ABNORMAL LOW (ref 36.0–46.0)
Hemoglobin: 11.5 g/dL — ABNORMAL LOW (ref 12.0–15.0)
MCH: 33.1 pg (ref 26.0–34.0)
MCHC: 32.9 g/dL (ref 30.0–36.0)
MCV: 100.9 fL — ABNORMAL HIGH (ref 78.0–100.0)
Platelets: 276 10*3/uL (ref 150–400)
RBC: 3.47 MIL/uL — ABNORMAL LOW (ref 3.87–5.11)
RDW: 15.3 % (ref 11.5–15.5)
WBC: 10.6 10*3/uL — ABNORMAL HIGH (ref 4.0–10.5)

## 2018-02-06 LAB — CULTURE, RESPIRATORY W GRAM STAIN: Culture: NORMAL

## 2018-02-06 LAB — FERRITIN: Ferritin: 74 ng/mL (ref 11–307)

## 2018-02-06 LAB — BASIC METABOLIC PANEL
Anion gap: 8 (ref 5–15)
BUN: 10 mg/dL (ref 6–20)
CO2: 26 mmol/L (ref 22–32)
Calcium: 8.3 mg/dL — ABNORMAL LOW (ref 8.9–10.3)
Chloride: 110 mmol/L (ref 101–111)
Creatinine, Ser: 0.67 mg/dL (ref 0.44–1.00)
GFR calc Af Amer: >60 mL/min (ref >60)
GFR calc non Af Amer: >60 mL/min (ref >60)
Glucose, Bld: 120 mg/dL — ABNORMAL HIGH (ref 65–99)
Potassium: 2.8 mmol/L — ABNORMAL LOW (ref 3.5–5.1)
Sodium: 144 mmol/L (ref 135–145)

## 2018-02-06 LAB — RETICULOCYTES
RBC.: 3.47 MIL/uL — ABNORMAL LOW (ref 3.87–5.11)
Retic Count, Absolute: 52.1 10*3/uL (ref 19.0–186.0)
Retic Ct Pct: 1.5 % (ref 0.4–3.1)

## 2018-02-06 LAB — CULTURE, RESPIRATORY

## 2018-02-06 LAB — MAGNESIUM: Magnesium: 1.8 mg/dL (ref 1.7–2.4)

## 2018-02-06 LAB — FOLATE: Folate: 26.8 ng/mL (ref >5.9)

## 2018-02-06 LAB — VITAMIN B12: Vitamin B-12: 211 pg/mL (ref 180–914)

## 2018-02-06 MED ORDER — PREDNISONE 20 MG PO TABS: 40.0000 mg | ORAL_TABLET | Freq: Two times a day (BID) | ORAL | 0 refills | Status: DC

## 2018-02-06 MED ORDER — DOXYCYCLINE HYCLATE 100 MG PO TABS: 100.0000 mg | ORAL_TABLET | Freq: Two times a day (BID) | ORAL | Status: DC

## 2018-02-06 MED ORDER — POTASSIUM CHLORIDE CRYS ER 20 MEQ PO TBCR
40.0000 meq | EXTENDED_RELEASE_TABLET | Freq: Two times a day (BID) | ORAL | Status: DC
  Administered 2018-02-06: 40 meq via ORAL
  Filled 2018-02-06: qty 2

## 2018-02-06 MED ORDER — DOXYCYCLINE HYCLATE 100 MG PO TABS: 100.0000 mg | ORAL_TABLET | Freq: Two times a day (BID) | ORAL | 0 refills | Status: DC

## 2018-02-06 MED ORDER — POTASSIUM CHLORIDE CRYS ER 20 MEQ PO TBCR: 40.0000 meq | EXTENDED_RELEASE_TABLET | Freq: Every day | ORAL | 0 refills | Status: DC

## 2018-02-06 MED ORDER — CEFUROXIME AXETIL 500 MG PO TABS: 500.0000 mg | ORAL_TABLET | Freq: Two times a day (BID) | ORAL | 0 refills | Status: DC

## 2018-02-06 MED ORDER — CEFUROXIME AXETIL 500 MG PO TABS
500.0000 mg | ORAL_TABLET | Freq: Two times a day (BID) | ORAL | Status: DC
  Filled 2018-02-06: qty 1

## 2018-02-06 MED ORDER — HYDROXYCHLOROQUINE SULFATE 200 MG PO TABS: 200.0000 mg | ORAL_TABLET | Freq: Every day | ORAL | 0 refills | Status: DC

## 2018-02-06 MED ORDER — ENSURE ENLIVE PO LIQD: 237.0000 mL | Freq: Two times a day (BID) | ORAL | 12 refills | Status: DC

## 2018-02-06 MED ORDER — MUPIROCIN 2 % EX OINT: 1.0000 "application " | TOPICAL_OINTMENT | Freq: Two times a day (BID) | CUTANEOUS | 0 refills | Status: AC

## 2018-02-06 NOTE — Progress Notes (Signed)
Pt discharging home with [facility] HHPT/OT/RN.

## 2018-02-06 NOTE — Progress Notes (Signed)
Patient called nurse to room at  this time that she thinks  she is having a reaction to Levaquin given to her at 2236. Patient was showing  how her fingers are hard to bend. Michela Pitcher it is the same reaction she had yesterday night when she thinks it was from  IV infiltration and the nurse have to start a new IV.  Patient fingers assess, ask about her rheumatoid arthritis which patient  said does not hit her like that.  Patient alert and verbally responsive, in no  acute distress. Denies any difficulty breathing. Stated she wants to make sure she does not get that medicine again tonight. Reassure patient MD will be notified about her concern.

## 2018-02-06 NOTE — Care Management Important Message (Addendum)
Important Message  Patient Details IM Letter given to Cookie/Case Manager to present to the Patient Name: Eileen Wilkerson MRN: [id number] Date of Birth: Jan 18, 1932   Medicare Important Message Given:  Yes    Kerin Salen 02/06/2018, 12:27 [place] Message  Patient Details  Name: Eileen Wilkerson MRN: [id number] Date of Birth: 12/10/1932   Medicare Important Message Given:  Yes    Kerin Salen 02/06/2018, 12:27 PM

## 2018-02-06 NOTE — Discharge Summary (Signed)
Physician Discharge Summary  RABECKA BRENDEL EQA:[id number] DOB: [birth year] DOA: 02/02/2018  PCP: Briscoe Deutscher, DO  Admit date: 02/02/2018 Discharge date: 02/06/2018  Admitted From: Home Disposition: Home with Home Health  Recommendations for Outpatient Follow-up:  1. Follow up with PCP in 1-2 weeks 2. Follow up with Rheumatology within 1 week 3. Please obtain CMP/CBC, Mag, Phos in one week 4. Repeat CXR in 3-6 weeks 5. Please follow up on the following pending results:  Home Health: YES  Equipment/Devices: None recommended by PT/OT  Discharge Condition: Stable  CODE STATUS: FULL CODE Diet recommendation: As before  Brief/Interim Summary: The patient is an  84 year old Caucasian female with a past medical history of rheumatoid arthritis on immunosuppressants and chronic steroids, history of chronic diastolic CHF, history of UTI who was hospitalized earlier this month for E. coli UTI and completed course of antibiotics.  Symptomatically the patient improved.  She was feeling better and then over the last 24 hours started developing fever confusion nausea and vomiting.  She was brought back into the hospital.  She was noted to be septic with elevated lactic acid levels.  No clear source of infection was identified except for perhaps a right lower lobe pneumonia noted on CT.  Patient did not have any respiratory symptoms.  Cultures have all been negative so far.  Antibiotics to be de-escalated and patient improved. She was deemed medically stable to D/C Home with Home Health after PT Evaluated. She will need to follow up with PCP and Rheumatology as an outpatient.   Discharge Diagnoses:  Principal Problem:   Severe sepsis ([place]) Active Problems:   Rheumatoid arthritis ([place])   Long term current use of systemic steroids   HCAP (healthcare-associated pneumonia)   Nausea and vomiting  Severe Sepsis/possible atypical pneumonia -No clear source has been identified yet except for the  abnormalities noted in the right lung.   -Blood cultures are negative so far.   -Sputum cultures pending.   -Due to unclear source patient was started on broad-spectrum antibiotics with vancomycin and aztreonam and Levaquin.  Aztreonam changed over to cefepime since patient has tolerated cephalosporins previously.  Since cultures remain negative vancomycin and cefepime will be discontinued.  Change Levaquin to oral but patient had a reaction so will change to Cefuroxime and Doxycycline to complete course -Lactic acid level was normal.  Pro-calcitonin level continues to improve.   -No diarrhea. UA does not show any infection. Influenza PCR negative.  - Strep pneumo urinary antigen negative. WBC is normal.  No new symptoms.   -Repeat CXR as an outpatient   Long-term steroid use for rheumatoid arthritis -Patient was given stress dose steroids due to borderline low blood pressures.  She has stabilized.  Change to oral prednisone and taper down to her usual dose. -Follow up with PCP to Taper Down     History of rheumatoid arthritis -On steroids on a chronic basis.  She is also on methotrexate and Plaquenil which are being held due to Severe sepsis. -Follow up with Rheumatology to Resume  Nausea and vomiting -No clear etiology identified for same.   -LFTs are normal.   -CT abdomen pelvis did not show any acute process.  She reports no diarrhea.  Nausea and vomiting has resolved.   -Diet has been advanced and patient tolerated it well  Mild acute renal failure -Creatinine was noted to be slightly higher than her baseline.   -Most likely due to hypovolemia.  She was given IV fluids.  Renal function  is now back to baseline.  Monitor urine output.  -Follow up with PCP to repeat CMP   Macrocytic anemia -Drop in hemoglobin is dilutional.  No overt bleeding. -Anemia Panel showed Iron level of 89, UIBC of 192, TIBC of 281, Saturation ratio of 32, Ferritin of 74, Folate of 26.8 -Hb/Hct improved  and is now 11.5/35.0  -Repeat CBC as an outpatient   Hypokalemia and hypomagnesemia -K+ was 2.8 and Replete prior to D/C -Mag was 1.8 -Repeat CMP as an outpatient   Discharge Instructions  Discharge Instructions    Call MD for:  difficulty breathing, headache or visual disturbances   Complete by:  As directed    Call MD for:  extreme fatigue   Complete by:  As directed    Call MD for:  hives   Complete by:  As directed    Call MD for:  persistant dizziness or light-headedness   Complete by:  As directed    Call MD for:  persistant nausea and vomiting   Complete by:  As directed    Call MD for:  redness, tenderness, or signs of infection (pain, swelling, redness, odor or green/yellow discharge around incision site)   Complete by:  As directed    Call MD for:  severe uncontrolled pain   Complete by:  As directed    Call MD for:  temperature >100.4   Complete by:  As directed    Diet - low sodium heart healthy   Complete by:  As directed    Discharge instructions   Complete by:  As directed    Follow up with PCP, Rheumatology, and Pulmonary as an outpatient. Take all medications as prescribed. If symptoms change or worsen please return to the ED for evaluation.   Increase activity slowly   Complete by:  As directed      Allergies as of 02/06/2018      Reactions   Adhesive [tape] Rash   Celebrex [celecoxib] Hives   Ciprofibrate Nausea Only   Cymbalta [duloxetine Hcl] Swelling   Gabitril [tiagabine] Swelling   Keflex [cephalexin] Nausea And Vomiting   Lyrica [pregabalin] Swelling   Neurontin [gabapentin] Swelling   Nexium [esomeprazole] Rash   Nsaids Rash   Penicillins Rash   Injection site reaction. Tolerated cefepime in past Has patient had a PCN reaction causing immediate rash, facial/tongue/throat swelling, SOB or lightheadedness with hypotension: No Has patient had a PCN reaction causing severe rash involving mucus membranes or skin necrosis: No Has patient had a  PCN reaction that required hospitalization No Has patient had a PCN reaction occurring within the last 10 years: No If all of the above answers are "NO", then may proceed with Cephalosporin use.   Shrimp [shellfish Allergy] Anaphylaxis   Per patient "shrimp only"   Sulfa Antibiotics Nausea And Vomiting   Ciprofloxacin Other (See Comments)   dizziness   Claritin-d 12 Hour [loratadine-pseudoephedrine Er] Anxiety      Medication List    TAKE these medications   acetaminophen 650 MG CR tablet Commonly known as:  TYLENOL Take 500 mg by mouth 3 (three) times daily.   atorvastatin 20 MG tablet Commonly known as:  LIPITOR TAKE 1 TABLET BY MOUTH ONCE DAILY AT BEDTIME AS NEEDED What changed:  See the new instructions.   BIOTIN MAXIMUM STRENGTH 10 MG Tabs Generic drug:  Biotin Take 10 mg by mouth daily.   cefUROXime 500 MG tablet Commonly known as:  CEFTIN Take 1 tablet (500 mg total) by  mouth 2 (two) times daily with a meal.   doxycycline 100 MG tablet Commonly known as:  VIBRA-TABS Take 1 tablet (100 mg total) by mouth every 12 (twelve) hours.   esomeprazole 20 MG packet Commonly known as:  NEXIUM Take 20 mg by mouth 2 (two) times daily.   feeding supplement (ENSURE ENLIVE) Liqd Take 237 mLs by mouth 2 (two) times daily between meals. Start taking on:  02/07/2018   fluticasone 50 MCG/ACT nasal spray Commonly known as:  FLONASE Place 2 sprays into both nostrils daily.   folic acid 1 MG tablet Commonly known as:  FOLVITE Take 1 mg by mouth daily.   furosemide 80 MG tablet Commonly known as:  LASIX Take 1 tablet (80 mg total) by mouth daily.   hydroxychloroquine 200 MG tablet Commonly known as:  PLAQUENIL Take 1 tablet (200 mg total) by mouth daily. Resume when ok by Rheumatology as an outpatient What changed:  additional instructions   mupirocin ointment 2 % Commonly known as:  BACTROBAN Place 1 application into the nose 2 (two) times daily for 2 days.   OTREXUP 15  MG/0.4ML So[place] Generic drug:  Methotrexate (PF) INJECT ONE PEN SUBCUTANEOUSLY ONCE EVERY WEEK. STORE AT ROOM TEMPERATURE BETWEEN 68 - 77 DEGREES F.   potassium chloride SA 20 MEQ tablet Commonly known as:  K-DUR,KLOR-CON Take 2 tablets (40 mEq total) by mouth daily.   predniSONE 20 MG tablet Commonly known as:  DELTASONE Take 2 tablets (40 mg total) by mouth 2 (two) times daily with a meal. What changed:    medication strength  how much to take  when to take this  Another medication with the same name was removed. Continue taking this medication, and follow the directions you see here.   SAVELLA 50 MG Tabs tablet Generic drug:  Milnacipran TAKE 1 TABLET BY MOUTH TWICE DAILY What changed:    how much to take  how to take this  when to take this   traMADol 50 MG tablet Commonly known as:  ULTRAM TAKE 1 TABLET BY MOUTH TWICE DAILY AS NEEDED What changed:    how much to take  how to take this  when to take this   Vitamin D3 5000 units Caps Take 5,000 Units by mouth daily.       Allergies  Allergen Reactions  . Adhesive [Tape] Rash  . Celebrex [Celecoxib] Hives  . Ciprofibrate Nausea Only  . Cymbalta [Duloxetine Hcl] Swelling  . Gabitril [Tiagabine] Swelling  . Keflex [Cephalexin] Nausea And Vomiting  . Lyrica [Pregabalin] Swelling  . Neurontin [Gabapentin] Swelling  . Nexium [Esomeprazole] Rash  . Nsaids Rash  . Penicillins Rash    Injection site reaction. Tolerated cefepime in past Has patient had a PCN reaction causing immediate rash, facial/tongue/throat swelling, SOB or lightheadedness with hypotension: No Has patient had a PCN reaction causing severe rash involving mucus membranes or skin necrosis: No Has patient had a PCN reaction that required hospitalization No Has patient had a PCN reaction occurring within the last 10 years: No If all of the above answers are "NO", then may proceed with Cephalosporin use.   . Shrimp [Shellfish Allergy]  Anaphylaxis    Per patient "shrimp only"  . Sulfa Antibiotics Nausea And Vomiting  . Ciprofloxacin Other (See Comments)    dizziness  . Claritin-D 12 Hour [Loratadine-Pseudoephedrine Er] Anxiety   Consultations: None  Procedures/Studies: Ct Abdomen Pelvis Wo Contrast  Result Date: 02/03/2018 CLINICAL DATA:  Nausea and vomiting EXAM: CT  ABDOMEN AND PELVIS WITHOUT CONTRAST TECHNIQUE: Multidetector CT imaging of the abdomen and pelvis was performed following the standard protocol without IV contrast. COMPARISON:  CT lumbar spine 04/21/2015 FINDINGS: Lower chest: Lung bases demonstrate patchy dependent consolidations. This most likely reflects atelectasis. Mild tree-in-bud densities in the lingula, right middle lobe and bilateral lung bases. No pleural effusion. Normal heart size. Hepatobiliary: No focal liver abnormality is seen. No gallstones, gallbladder wall thickening, or biliary dilatation. Pancreas: Unremarkable. No pancreatic ductal dilatation or surrounding inflammatory changes. Spleen: Normal in size without focal abnormality. Adrenals/Urinary Tract: Adrenal glands are unremarkable. Kidneys are normal, without renal calculi, focal lesion, or hydronephrosis. Bladder is unremarkable. Stomach/Bowel: Stomach is within normal limits. Appendix appears normal. No evidence of bowel wall thickening, distention, or inflammatory changes. Vascular/Lymphatic: Mild aortic atherosclerosis. No aneurysmal dilatation. No significantly enlarged lymph nodes Reproductive: Uterus and bilateral adnexa are unremarkable. Other: Negative for free air or free fluid. Tiny fat in the umbilical region Musculoskeletal: Degenerative changes with interbody devices at L3-L4 and L4-L5. IMPRESSION: 1. No CT evidence for acute intra-abdominal or pelvic abnormality. Negative for bowel obstruction or bowel wall thickening 2. Findings suggesting bronchiolitis or atypical infection at the bilateral lung bases, lingula and right middle  lobe. Electronically Signed   By: Donavan Foil M.D.   On: 02/03/2018 02:15   Dg Chest 2 View  Result Date: 01/18/2018 CLINICAL DATA:  Shortness of breath, productive cough. EXAM: CHEST  2 VIEW COMPARISON:  Radiographs of July 11, 2017. FINDINGS: The heart size and mediastinal contours are within normal limits. No pneumothorax or pleural effusion is noted. Right lung is clear. Elevated left hemidiaphragm is noted with mild left basilar subsegmental atelectasis or scarring. The visualized skeletal structures are unremarkable. IMPRESSION: Elevated left hemidiaphragm with mild left basilar subsegmental atelectasis or scarring. Electronically Signed   By: Marijo Conception, M.D.   On: 01/18/2018 11:50   Dg Chest Port 1 View  Result Date: 02/03/2018 CLINICAL DATA:  Sepsis EXAM: PORTABLE CHEST 1 VIEW COMPARISON:  01/19/2018 FINDINGS: Postsurgical changes in the cervical spine. Elevation of left diaphragm with hazy atelectasis at the left base. No consolidation. Stable cardiomediastinal silhouette with atherosclerosis. No pneumothorax. IMPRESSION: No active disease. Stable elevation of left diaphragm with atelectasis or scarring at the left base Electronically Signed   By: Donavan Foil M.D.   On: 02/03/2018 00:10   Dg Chest Port 1 View  Result Date: 01/19/2018 CLINICAL DATA:  84 y/o  F; acute respiratory failure with hypoxia. EXAM: PORTABLE CHEST 1 VIEW COMPARISON:  01/18/2018 chest radiograph FINDINGS: Normal cardiac silhouette. Aortic atherosclerosis with calcification. Elevated left hemidiaphragm with stable chronic scarring/atelectasis at left lung base. No new consolidation, effusion, or pneumothorax. Anterior cervical discectomy and fusion hardware. Multilevel degenerative changes of the thoracic spine. No pleural effusion or pneumothorax. No acute osseous abnormality is evident. IMPRESSION: Stable chronic scarring/atelectasis at left lung base and elevated left hemidiaphragm. No acute pulmonary process  identified. Electronically Signed   By: Kristine Garbe M.D.   On: 01/19/2018 05:18     Subjective: The patient was seen and examined at bedside and was improved. Had complaints of hand pain overnight. No CP or SOB. Ready to go home.  Discharge Exam: Vitals:   02/05/18 2219 02/06/18 0529  BP: (!) 143/79 (!) 165/84  Pulse: 77 77  Resp: 20 20  Temp: 98.4 F (36.9 C) 98.4 F (36.9 C)  SpO2: 96% 99%   Vitals:   02/05/18 1546 02/05/18 1617 02/05/18 2219 02/06/18 9983  BP:  139/72 (!) 143/79 (!) 165/84  Pulse: 100 100 77 77  Resp:   20 20  Temp:  97.8 F (36.6 C) 98.4 F (36.9 C) 98.4 F (36.9 C)  TempSrc:  Oral Oral Oral  SpO2: 97% 98% 96% 99%  Weight:      Height:       General: Pt is alert, awake, not in acute distress Cardiovascular: RRR, S1/S2 +, no rubs, no gallops Respiratory: CTA bilaterally, no wheezing, no rhonchi Abdominal: Soft, NT, ND, bowel sounds + Extremities: no edema, no cyanosis  The results of significant diagnostics from this hospitalization (including imaging, microbiology, ancillary and laboratory) are listed below for reference.    Microbiology: Recent Results (from the past 240 hour(s))  Blood Culture (routine x 2)     Status: None (Preliminary result)   Collection Time: 02/02/18 11:55 PM  Result Value Ref Range Status   Specimen Description   Final    BLOOD BLOOD LEFT FOREARM Performed at [facility] [address], [place], [place] [address]    Special Requests   Final    IN PEDIATRIC BOTTLE Blood Culture adequate volume Performed at [facility] [address] [place], [place] [address]    Culture   Final    NO GROWTH 3 DAYS Performed at [facility], [place] [address] [place], [place] [address]    Report Status PENDING  Incomplete  Blood Culture (routine x 2)     Status: None (Preliminary result)   Collection Time: 02/03/18 12:05 AM  Result Value Ref Range Status   Specimen  Description   Final    BLOOD LEFT HAND Performed at [facility] [address] [place], [place] [address]    Special Requests   Final    IN PEDIATRIC BOTTLE Blood Culture adequate volume Performed at [facility] [address], [place], [place] [address]    Culture   Final    NO GROWTH 3 DAYS Performed at [facility], [place] [address] [place] [address], [place], [place] [address]    Report Status PENDING  Incomplete  Culture, sputum-assessment     Status: None   Collection Time: 02/03/18  9:04 AM  Result Value Ref Range Status   Specimen Description SPUTUM  Final   Special Requests NONE  Final   Sputum evaluation   Final    THIS SPECIMEN IS ACCEPTABLE FOR SPUTUM CULTURE Performed at [facility], [place] [address], [place], [place] [address]    Report Status 02/03/2018 FINAL  Final  Culture, respiratory (NON-Expectorated)     Status: None   Collection Time: 02/03/18  9:04 AM  Result Value Ref Range Status   Specimen Description   Final    SPUTUM Performed at [facility] [address] [place],  [address]    Special Requests   Final    NONE Reflexed from [phone] [id number] Performed at [facility], [place] [address] [place], [place] [address]    Gram Stain   Final    MODERATE WBC PRESENT, PREDOMINANTLY MONONUCLEAR MODERATE SQUAMOUS EPITHELIAL CELLS PRESENT MODERATE GRAM POSITIVE RODS MODERATE GRAM POSITIVE COCCI IN PAIRS IN CLUSTERS FEW GRAM NEGATIVE COCCI IN PAIRS RARE GRAM NEGATIVE RODS    Culture   Final    ABUNDANT Consistent with normal respiratory flora. Performed at [facility], [place] [address] [place],  [address]    Report Status 02/06/2018 FINAL  Final  MRSA PCR Screening     Status: Abnormal   Collection Time: 02/03/18 12:33 PM  Result Value Ref Range Status   MRSA by PCR POSITIVE (A) NEGATIVE Final    Comment:        The GeneXpert MRSA Assay (FDA approved for  NASAL specimens only), is one component of a comprehensive MRSA colonization surveillance program. It is not intended to diagnose MRSA infection nor to guide or monitor treatment for MRSA infections. RESULT CALLED TO, READ BACK BY AND VERIFIED WITH: K.SEAY,RN 02/03/18 @1648  BY V.WILKINS Performed at [facility] [address], [place], [place] [address]    Labs: BNP (last 3 results) Recent Labs    05/17/17 1525  BNP 81.2   Basic Metabolic Panel: Recent Labs  Lab 02/03/18 0004 02/04/18 0446 02/04/18 2033 02/05/18 0426 02/06/18 0430  NA 141 144 140 141 144  K 3.4* 2.4* 3.6 3.4* 2.8*  CL 97* 110 111 112* 110  CO2 27 23 21* 21* 26  GLUCOSE 179* 119* 165* 113* 120*  BUN 31* 16 12 13 10   CREATININE 1.20* 0.75 0.88 0.75 0.67  CALCIUM 10.6* 8.0* 7.8* 7.8* 8.3*  MG  --  1.4*  --  2.0 1.8   Liver Function Tests: Recent Labs  Lab 02/03/18 0004 02/04/18 0446  AST 31 22  ALT 19 17  ALKPHOS 54 30*  BILITOT 0.8 0.6  PROT 8.3* 5.6*  ALBUMIN 4.0 2.5*   No results for input(s): LIPASE, AMYLASE in the last 168 hours. No results for input(s): AMMONIA in the last 168 hours. CBC: Recent Labs  Lab 02/03/18 0004 02/04/18 0446 02/05/18 0426 02/06/18 0430  WBC 20.5* 10.3 9.7 10.6*  NEUTROABS 18.3*  --   --   --   HGB 15.1* 10.5* 9.6* 11.5*  HCT 46.4* 33.0* 30.3* 35.0*  MCV 102.9* 104.1* 101.3* 100.9*  PLT 364 252 158 276   Cardiac Enzymes: Recent Labs  Lab 02/03/18 0004  TROPONINI <0.03   BNP: Invalid input(s): POCBNP CBG: No results for input(s): GLUCAP in the last 168 hours. D-Dimer No results for input(s): DDIMER in the last 72 hours. Hgb A1c No results for input(s): HGBA1C in the last 72 hours. Lipid Profile No results for input(s): CHOL, HDL, LDLCALC, TRIG, CHOLHDL, LDLDIRECT in the last 72 hours. Thyroid function studies No results for input(s): TSH, T4TOTAL, T3FREE, THYROIDAB in the last 72 hours.  Invalid input(s): FREET3 Anemia  work up Recent Labs    02/06/18 0430  VITAMINB12 211  FOLATE 26.8  FERRITIN 74  TIBC 281  IRON 89  RETICCTPCT 1.5   Urinalysis    Component Value Date/Time   COLORURINE STRAW (A) 02/03/2018 0110   APPEARANCEUR CLEAR 02/03/2018 0110   LABSPEC 1.009 02/03/2018 0110   PHURINE 7.0 02/03/2018 0110   GLUCOSEU NEGATIVE 02/03/2018 0110   HGBUR MODERATE (A) 02/03/2018 0110   BILIRUBINUR NEGATIVE 02/03/2018 0110   BILIRUBINUR Negative 01/14/2018 1454   KETONESUR NEGATIVE 02/03/2018 0110   PROTEINUR NEGATIVE 02/03/2018 0110   UROBILINOGEN 0.2 01/14/2018 1454   UROBILINOGEN 0.2 04/12/2010 1121   NITRITE NEGATIVE 02/03/2018 0110   LEUKOCYTESUR NEGATIVE 02/03/2018 0110   Sepsis Labs Invalid input(s): PROCALCITONIN,  WBC,  LACTICIDVEN Microbiology Recent Results (from the past 240 hour(s))  Blood Culture (routine x 2)     Status: None (Preliminary result)   Collection Time: 02/02/18 11:55 PM  Result Value Ref Range Status   Specimen Description   Final    BLOOD BLOOD LEFT FOREARM  Performed at [facility], [place] [address], [place], [place] [address]    Special Requests   Final    IN PEDIATRIC BOTTLE Blood Culture adequate volume Performed at [facility] [address] [place], [place] [address]    Culture   Final    NO GROWTH 3 DAYS Performed at [facility], [place] [address], [place], [place] [address]    Report Status PENDING  Incomplete  Blood Culture (routine x 2)     Status: None (Preliminary result)   Collection Time: 02/03/18 12:05 AM  Result Value Ref Range Status   Specimen Description   Final    BLOOD LEFT HAND Performed at [facility] [address], [place], [place] [address]    Special Requests   Final    IN PEDIATRIC BOTTLE Blood Culture adequate volume Performed at [facility] [address] [place], [place] [address]    Culture   Final    NO GROWTH 3 DAYS Performed at  [facility], [place] [address], [place], [place] [address]    Report Status PENDING  Incomplete  Culture, sputum-assessment     Status: None   Collection Time: 02/03/18  9:04 AM  Result Value Ref Range Status   Specimen Description SPUTUM  Final   Special Requests NONE  Final   Sputum evaluation   Final    THIS SPECIMEN IS ACCEPTABLE FOR SPUTUM CULTURE Performed at [facility], [place] [address] [place] [address], [place], [place] [address]    Report Status 02/03/2018 FINAL  Final  Culture, respiratory (NON-Expectorated)     Status: None   Collection Time: 02/03/18  9:04 AM  Result Value Ref Range Status   Specimen Description   Final    SPUTUM Performed at [facility] [address] [place], [place] [address]    Special Requests   Final    NONE Reflexed from [id number] Performed at [facility], [place] [address], [place], [place] [address]    Gram Stain   Final    MODERATE WBC PRESENT, PREDOMINANTLY MONONUCLEAR MODERATE SQUAMOUS EPITHELIAL CELLS PRESENT MODERATE GRAM POSITIVE RODS MODERATE GRAM POSITIVE COCCI IN PAIRS IN CLUSTERS FEW GRAM NEGATIVE COCCI IN PAIRS RARE GRAM NEGATIVE RODS    Culture   Final    ABUNDANT Consistent with normal respiratory flora. Performed at [facility], [place] [address] [place],  [address]    Report Status 02/06/2018 FINAL  Final  MRSA PCR Screening     Status: Abnormal   Collection Time: 02/03/18 12:33 PM  Result Value Ref Range Status   MRSA by PCR POSITIVE (A) NEGATIVE Final    Comment:        The GeneXpert MRSA Assay (FDA approved for NASAL specimens only), is one component of a comprehensive MRSA colonization surveillance program. It is not intended to diagnose MRSA infection nor to guide or monitor treatment for MRSA infections. RESULT CALLED TO, READ BACK BY AND VERIFIED WITH: K.SEAY,RN 02/03/18 @1648  BY V.WILKINS Performed at [facility], [place] [address] [place] [address], [place],  [address]    Time coordinating discharge: 35 minutes  SIGNED:  Kerney Elbe, DO [facility] 02/06/2018, 2:24 PM Pager [phone]  If 7PM-7AM, please contact night-coverage [email or web address] Password [email or web address]

## 2018-02-07 ENCOUNTER — Telehealth: Payer: Self-pay | Admitting: Family Medicine

## 2018-02-07 ENCOUNTER — Telehealth: Payer: Self-pay | Admitting: *Deleted

## 2018-02-07 NOTE — Telephone Encounter (Signed)
Copied from [place] [phone]. Topic: Quick Communication - See Telephone Encounter >> Feb 07, 2018  4:14 PM Ether Griffins B wrote: CRM for notification. See Telephone encounter for:  Mickel Baas with [facility] did a skilled nursing evaluation and would like verbal orders for 2x a week for 6 weeks. Call back number [phone].  02/07/18.

## 2018-02-07 NOTE — Telephone Encounter (Signed)
See note

## 2018-02-07 NOTE — Telephone Encounter (Signed)
Per telephone call:  Admit date: 02/02/2018 Discharge date: 02/06/2018  Admitted From: Home Disposition: Home with Home Health  Recommendations for Outpatient Follow-up:  1. Follow up with PCP in 1-2 weeks 2. Follow up with Rheumatology within 1 week 3. Please obtain CMP/CBC, Mag, Phos in one week 4. Repeat CXR in 3-6 weeks 5. Please follow up on the following pending results:  Home Health: YES  Equipment/Devices: None recommended by PT/OT  Discharge Condition: Stable  CODE STATUS: FULL CODE Diet recommendation: As before _______________________________________________________________________ Transition Care Management Follow-up Telephone Call   Date discharged? 02/06/18   How have you been since you were released from the hospital? "just terrible"   Do you understand why you were in the hospital? yes   Do you understand the discharge instructions? yes   Where were you discharged to? Home with Home Health. She states home health is coming this morning.   Items Reviewed:  Medications reviewed: yes  Allergies reviewed: yes  Dietary changes reviewed: yes  Referrals reviewed: yes   Functional Questionnaire:   Activities of Daily Living (ADLs):   She states they are independent in the following: ambulation, bathing and hygiene, feeding, continence, grooming, toileting and dressing States they require assistance with the following: none   Any transportation issues/concerns?: yes. Patient relies on transportation.   Any patient concerns? Yes. Patient was very concerned that her initial follow up appt was 02/18/18. She wanted to come in sooner since she is on several antibiotics and steroids. I rescheduled her follow up for 02/13/18.    Confirmed importance and date/time of follow-up visits scheduled yes  Provider Appointment booked with Dr Juleen China 02/13/18 10:40  Confirmed with patient if condition begins to worsen call PCP or go to the ER.  Patient was given  the office number and encouraged to call back with question or concerns.  : yes

## 2018-02-08 LAB — CULTURE, BLOOD (ROUTINE X 2)
Culture: NO GROWTH
Culture: NO GROWTH
Special Requests: ADEQUATE
Special Requests: ADEQUATE

## 2018-02-08 NOTE — Telephone Encounter (Signed)
Spoke with Mickel Baas at [facility] and gave verbal.

## 2018-02-13 ENCOUNTER — Ambulatory Visit (INDEPENDENT_AMBULATORY_CARE_PROVIDER_SITE_OTHER): Payer: Medicare Other

## 2018-02-13 ENCOUNTER — Encounter: Payer: Self-pay | Admitting: Family Medicine

## 2018-02-13 ENCOUNTER — Ambulatory Visit: Payer: Medicare Other | Admitting: Family Medicine

## 2018-02-13 VITALS — BP 150/90 | HR 74 | Ht 60.0 in | Wt 170.0 lb

## 2018-02-13 DIAGNOSIS — T380X5S Adverse effect of glucocorticoids and synthetic analogues, sequela: Secondary | ICD-10-CM | POA: Diagnosis not present

## 2018-02-13 DIAGNOSIS — Z87440 Personal history of urinary (tract) infections: Secondary | ICD-10-CM | POA: Diagnosis not present

## 2018-02-13 DIAGNOSIS — D72820 Lymphocytosis (symptomatic): Secondary | ICD-10-CM | POA: Diagnosis not present

## 2018-02-13 DIAGNOSIS — R300 Dysuria: Secondary | ICD-10-CM | POA: Diagnosis not present

## 2018-02-13 DIAGNOSIS — R5383 Other fatigue: Secondary | ICD-10-CM | POA: Diagnosis not present

## 2018-02-13 DIAGNOSIS — R05 Cough: Secondary | ICD-10-CM

## 2018-02-13 DIAGNOSIS — N179 Acute kidney failure, unspecified: Secondary | ICD-10-CM

## 2018-02-13 DIAGNOSIS — R059 Cough, unspecified: Secondary | ICD-10-CM

## 2018-02-13 DIAGNOSIS — Z7952 Long term (current) use of systemic steroids: Secondary | ICD-10-CM

## 2018-02-13 DIAGNOSIS — Z09 Encounter for follow-up examination after completed treatment for conditions other than malignant neoplasm: Secondary | ICD-10-CM

## 2018-02-13 LAB — URINALYSIS, ROUTINE W REFLEX MICROSCOPIC
Bilirubin Urine: NEGATIVE
Hgb urine dipstick: NEGATIVE
Ketones, ur: NEGATIVE
Leukocytes, UA: NEGATIVE
Nitrite: NEGATIVE
Specific Gravity, Urine: <=1.005 — AB (ref 1.000–1.030)
Total Protein, Urine: NEGATIVE
Urine Glucose: NEGATIVE
Urobilinogen, UA: 0.2 (ref 0.0–1.0)
pH: 6.5 (ref 5.0–8.0)

## 2018-02-13 LAB — CBC WITH DIFFERENTIAL/PLATELET
Basophils Absolute: 0.1 10*3/uL (ref 0.0–0.1)
Basophils Relative: 0.3 % (ref 0.0–3.0)
Eosinophils Absolute: 0 10*3/uL (ref 0.0–0.7)
Eosinophils Relative: 0.1 % (ref 0.0–5.0)
HCT: 41.2 % (ref 36.0–46.0)
Hemoglobin: 13.4 g/dL (ref 12.0–15.0)
Lymphocytes Relative: 11.8 % — ABNORMAL LOW (ref 12.0–46.0)
Lymphs Abs: 2.2 10*3/uL (ref 0.7–4.0)
MCHC: 32.5 g/dL (ref 30.0–36.0)
MCV: 100.8 fl — ABNORMAL HIGH (ref 78.0–100.0)
Monocytes Absolute: 1.5 10*3/uL — ABNORMAL HIGH (ref 0.1–1.0)
Monocytes Relative: 8 % (ref 3.0–12.0)
Neutro Abs: 15.1 10*3/uL — ABNORMAL HIGH (ref 1.4–7.7)
Neutrophils Relative %: 79.8 % — ABNORMAL HIGH (ref 43.0–77.0)
Platelets: 307 10*3/uL (ref 150.0–400.0)
RBC: 4.08 Mil/uL (ref 3.87–5.11)
RDW: 16.1 % — ABNORMAL HIGH (ref 11.5–15.5)
WBC: 19 10*3/uL (ref 4.0–10.5)

## 2018-02-13 LAB — COMPREHENSIVE METABOLIC PANEL
ALT: 25 U/L (ref 0–35)
AST: 20 U/L (ref 0–37)
Albumin: 4.1 g/dL (ref 3.5–5.2)
Alkaline Phosphatase: 42 U/L (ref 39–117)
BUN: 37 mg/dL — ABNORMAL HIGH (ref 6–23)
CO2: 30 mEq/L (ref 19–32)
Calcium: 11.7 mg/dL — ABNORMAL HIGH (ref 8.4–10.5)
Chloride: 99 mEq/L (ref 96–112)
Creatinine, Ser: 1.26 mg/dL — ABNORMAL HIGH (ref 0.40–1.20)
GFR: 43.06 mL/min — ABNORMAL LOW (ref >60.00)
Glucose, Bld: 95 mg/dL (ref 70–99)
Potassium: 4.9 mEq/L (ref 3.5–5.1)
Sodium: 138 mEq/L (ref 135–145)
Total Bilirubin: 0.7 mg/dL (ref 0.2–1.2)
Total Protein: 7.8 g/dL (ref 6.0–8.3)

## 2018-02-13 LAB — PHOSPHORUS: Phosphorus: 4.5 mg/dL (ref 2.3–4.6)

## 2018-02-13 LAB — MAGNESIUM: Magnesium: 2.1 mg/dL (ref 1.5–2.5)

## 2018-02-13 NOTE — Progress Notes (Signed)
Office Visit Note  Patient: Eileen Wilkerson             Date of Birth: 01-Sep-1932           MRN: [id number]             PCP: Briscoe Deutscher, DO Referring: Briscoe Deutscher, DO Visit Date: 02/14/2018 Occupation: @GUAROCC @    Subjective:  Medication Management (discuss meds )   History of Present Illness: Eileen Wilkerson is a 84 y.o. female with history of rheumatoid arthritis osteoarthritis and adrenal insufficiency.  Patient states that in the last month she was hospitalized twice for presumptive sepsis.  She was treated with IV antibiotics and then discharged on prednisone 80 mg a day.  She has decreased prednisone to 50 mg a day currently.  She states she is not having any joint symptoms.  She was taken off the methotrexate and Plaquenil due to sepsis.  Although all her cultures were negative.  She states she has been under care of Dr. Chalmers Cater for multiple years due to adrenal insufficiency although she has not seen her recently.  Activities of Daily Living:  Patient reports morning stiffness for 0 minute.   Patient Denies nocturnal pain.  Difficulty dressing/grooming: Denies Difficulty climbing stairs: Reports Difficulty getting out of chair: Reports Difficulty using hands for taps, buttons, cutlery, and/or writing: Denies   Review of Systems  Constitutional: Positive for fatigue. Negative for night sweats, weight gain, weight loss and weakness.  HENT: Positive for mouth dryness. Negative for mouth sores, trouble swallowing, trouble swallowing and nose dryness.   Eyes: Negative for pain, redness, visual disturbance and dryness.  Respiratory: Negative for cough, shortness of breath and difficulty breathing.   Cardiovascular: Negative for chest pain, palpitations, hypertension, irregular heartbeat and swelling in legs/feet.  Gastrointestinal: Negative for blood in stool, constipation and diarrhea.  Endocrine: Negative for increased urination.  Genitourinary: Negative for  vaginal dryness.  Musculoskeletal: Negative for arthralgias, joint pain, joint swelling, myalgias, muscle weakness, morning stiffness, muscle tenderness and myalgias.  Skin: Negative for color change, rash, hair loss, skin tightness, ulcers and sensitivity to sunlight.  Allergic/Immunologic: Negative for susceptible to infections.  Neurological: Negative for dizziness, memory loss and night sweats.  Hematological: Negative for swollen glands.  Psychiatric/Behavioral: Positive for sleep disturbance. Negative for depressed mood. The patient is not nervous/anxious.     PMFS History:  Patient Active Problem List   Diagnosis Date Noted  . Severe sepsis ([place]) 02/03/2018  . HCAP (healthcare-associated pneumonia) 02/03/2018  . Nausea and vomiting 02/03/2018  . Sepsis due to urinary tract infection ([place]) 01/18/2018  . E. coli UTI 01/18/2018  . Hypoxia 01/18/2018  . Chronic diastolic CHF (congestive heart failure) ([place]) 01/18/2018  . Idiopathic chronic venous hypertension of both lower extremities with ulcer and inflammation ([place]) 09/18/2017  . Closed nondisplaced fracture of fifth right metatarsal bone 09/18/2017  . Bilateral lower extremity edema 06/30/2017  . Physical deconditioning 05/30/2017  . Immunosuppressed status ([place]) 05/30/2017  . Abnormal chest x-ray 05/17/2017  . PNA (pneumonia) 04/25/2017  . Hypokalemia 04/25/2017  . Fibromyalgia 12/18/2016  . DJD (degenerative joint disease), cervical 12/18/2016  . Spondylosis of lumbar region without myelopathy or radiculopathy 12/18/2016  . Trigger finger, right middle finger 12/18/2016  . Primary osteoarthritis of both hands 12/18/2016  . Primary osteoarthritis of both feet 12/18/2016  . Primary osteoarthritis of both knees 12/18/2016  . GERD (gastroesophageal reflux disease) 12/18/2016  . Age-related osteoporosis without current pathological fracture 12/18/2016  .  High risk medication use 12/18/2016  . Rheumatoid arthritis ([place])  12/28/2015  . Long term current use of systemic steroids 12/28/2015  . Lymphocytosis 11/13/2012    Past Medical History:  Diagnosis Date  . Adrenal failure ([place])   . Arthritis   . Cataract   . Fibromyalgia 2008  . Osteoporosis   . RA (rheumatoid arthritis) ([facility])     Family History  Problem Relation Age of Onset  . Breast cancer Mother   . Diabetes Father    Past Surgical History:  Procedure Laterality Date  . BACK SURGERY    . BILATERAL CARPAL TUNNEL RELEASE    . CATARACT EXTRACTION, BILATERAL  2004  . CERVICAL FUSION  2011,2010,2008  . HEEL SPUR SURGERY  2004  . ROTATOR CUFF REPAIR    . TONSILLECTOMY AND ADENOIDECTOMY  1947  . TOTAL SHOULDER ARTHROPLASTY     Social History   Social History Narrative  . Not on file     Objective: Vital Signs: BP 128/78 (BP Location: Left Arm, Patient Position: Sitting, Cuff Size: Normal)   Pulse 75   Resp 16   Ht 5\' 3"  (1.6 m)   Wt 163 lb (73.9 kg)   BMI 28.87 kg/m    Physical Exam  Constitutional: She is oriented to person, place, and time. She appears well-developed and well-nourished.  HENT:  Head: Normocephalic and atraumatic.  Eyes: Conjunctivae and EOM are normal.  Neck: Normal range of motion.  Cardiovascular: Normal rate, regular rhythm, normal heart sounds and intact distal pulses.  Pulmonary/Chest: Effort normal and breath sounds normal.  Abdominal: Soft. Bowel sounds are normal.  Lymphadenopathy:    She has no cervical adenopathy.  Neurological: She is alert and oriented to person, place, and time.  Skin: Skin is warm and dry. Capillary refill takes less than 2 seconds.  Psychiatric: She has a normal mood and affect. Her behavior is normal.  Nursing note and vitals reviewed.    Musculoskeletal Exam: C-spine thoracic lumbar spine limited range of motion.  Shoulder joints elbow joints with good range of motion.  She is synovial thickening over MCP joints but no synovitis was noted.  She has PIP/DIP thickening in  her hands.  She did good range of motion of her knee joints without warmth or effusion.  Joints were difficult to assess as she was sitting in the chair and was unable to get on the exam table.  CDAI Exam: CDAI Homunculus Exam:   Joint Counts:  CDAI Tender Joint count: 0 CDAI Swollen Joint count: 0  Global Assessments:  Patient Global Assessment: 1 Provider Global Assessment: 1  CDAI Calculated Score: 2    Investigation: No additional findings. eye exam: 10/18/2017 UDS: 02/22/2017  Narc agreement 02/22/2017. CBC Latest Ref Rng & Units 02/13/2018 02/06/2018 02/05/2018  WBC 4.0 - 10.5 K/uL 19.0 Repeated and verified X2.([place]) 10.6(H) 9.7  Hemoglobin 12.0 - 15.0 g/dL 13.4 11.5(L) 9.6(L)  Hematocrit 36.0 - 46.0 % 41.2 35.0(L) 30.3(L)  Platelets 150.0 - 400.0 K/uL 307.0 276 158   CMP Latest Ref Rng & Units 02/13/2018 02/06/2018 02/05/2018  Glucose 70 - 99 mg/dL 95 120(H) 113(H)  BUN 6 - 23 mg/dL 37(H) 10 13  Creatinine 0.40 - 1.20 mg/dL 1.26(H) 0.67 0.75  Sodium 135 - 145 mEq/L 138 144 141  Potassium 3.5 - 5.1 mEq/L 4.9 2.8(L) 3.4(L)  Chloride 96 - 112 mEq/L 99 110 112(H)  CO2 19 - 32 mEq/L 30 26 21(L)  Calcium 8.4 - 10.5 mg/dL 11.7(H) 8.3(L)  7.8(L)  Total Protein 6.0 - 8.3 g/dL 7.8 - -  Total Bilirubin 0.2 - 1.2 mg/dL 0.7 - -  Alkaline Phos 39 - 117 U/L 42 - -  AST 0 - 37 U/L 20 - -  ALT 0 - 35 U/L 25 - -    Imaging: Ct Abdomen Pelvis Wo Contrast  Result Date: 02/03/2018 CLINICAL DATA:  Nausea and vomiting EXAM: CT ABDOMEN AND PELVIS WITHOUT CONTRAST TECHNIQUE: Multidetector CT imaging of the abdomen and pelvis was performed following the standard protocol without IV contrast. COMPARISON:  CT lumbar spine 04/21/2015 FINDINGS: Lower chest: Lung bases demonstrate patchy dependent consolidations. This most likely reflects atelectasis. Mild tree-in-bud densities in the lingula, right middle lobe and bilateral lung bases. No pleural effusion. Normal heart size. Hepatobiliary: No focal liver  abnormality is seen. No gallstones, gallbladder wall thickening, or biliary dilatation. Pancreas: Unremarkable. No pancreatic ductal dilatation or surrounding inflammatory changes. Spleen: Normal in size without focal abnormality. Adrenals/Urinary Tract: Adrenal glands are unremarkable. Kidneys are normal, without renal calculi, focal lesion, or hydronephrosis. Bladder is unremarkable. Stomach/Bowel: Stomach is within normal limits. Appendix appears normal. No evidence of bowel wall thickening, distention, or inflammatory changes. Vascular/Lymphatic: Mild aortic atherosclerosis. No aneurysmal dilatation. No significantly enlarged lymph nodes Reproductive: Uterus and bilateral adnexa are unremarkable. Other: Negative for free air or free fluid. Tiny fat in the umbilical region Musculoskeletal: Degenerative changes with interbody devices at L3-L4 and L4-L5. IMPRESSION: 1. No CT evidence for acute intra-abdominal or pelvic abnormality. Negative for bowel obstruction or bowel wall thickening 2. Findings suggesting bronchiolitis or atypical infection at the bilateral lung bases, lingula and right middle lobe. Electronically Signed   By: Donavan Foil M.D.   On: 02/03/2018 02:15   Dg Chest 2 View  Result Date: 02/13/2018 CLINICAL DATA:  Shortness of breath, cough. EXAM: CHEST - 2 VIEW COMPARISON:  Radiograph of February 02, 2018. FINDINGS: The heart size and mediastinal contours are within normal limits. Both lungs are clear. No pneumothorax or pleural effusion is noted. The visualized skeletal structures are unremarkable. IMPRESSION: No active cardiopulmonary disease. Electronically Signed   By: Marijo Conception, M.D.   On: 02/13/2018 15:48   Dg Chest 2 View  Result Date: 01/18/2018 CLINICAL DATA:  Shortness of breath, productive cough. EXAM: CHEST  2 VIEW COMPARISON:  Radiographs of July 11, 2017. FINDINGS: The heart size and mediastinal contours are within normal limits. No pneumothorax or pleural effusion is  noted. Right lung is clear. Elevated left hemidiaphragm is noted with mild left basilar subsegmental atelectasis or scarring. The visualized skeletal structures are unremarkable. IMPRESSION: Elevated left hemidiaphragm with mild left basilar subsegmental atelectasis or scarring. Electronically Signed   By: Marijo Conception, M.D.   On: 01/18/2018 11:50   Dg Chest Port 1 View  Result Date: 02/03/2018 CLINICAL DATA:  Sepsis EXAM: PORTABLE CHEST 1 VIEW COMPARISON:  01/19/2018 FINDINGS: Postsurgical changes in the cervical spine. Elevation of left diaphragm with hazy atelectasis at the left base. No consolidation. Stable cardiomediastinal silhouette with atherosclerosis. No pneumothorax. IMPRESSION: No active disease. Stable elevation of left diaphragm with atelectasis or scarring at the left base Electronically Signed   By: Donavan Foil M.D.   On: 02/03/2018 00:10   Dg Chest Port 1 View  Result Date: 01/19/2018 CLINICAL DATA:  84 y/o  F; acute respiratory failure with hypoxia. EXAM: PORTABLE CHEST 1 VIEW COMPARISON:  01/18/2018 chest radiograph FINDINGS: Normal cardiac silhouette. Aortic atherosclerosis with calcification. Elevated left hemidiaphragm with stable chronic  scarring/atelectasis at left lung base. No new consolidation, effusion, or pneumothorax. Anterior cervical discectomy and fusion hardware. Multilevel degenerative changes of the thoracic spine. No pleural effusion or pneumothorax. No acute osseous abnormality is evident. IMPRESSION: Stable chronic scarring/atelectasis at left lung base and elevated left hemidiaphragm. No acute pulmonary process identified. Electronically Signed   By: Kristine Garbe M.D.   On: 01/19/2018 05:18    Speciality Comments: PLQ eye exam: 10/18/2017 Normal. Dr. Ricki Miller. Follow up in 6 months.    Procedures:  No procedures performed Allergies: Adhesive [tape]; Celebrex [celecoxib]; Ciprofibrate; Cymbalta [duloxetine hcl]; Gabitril [tiagabine]; Keflex  [cephalexin]; Lyrica [pregabalin]; Neurontin [gabapentin]; Nexium [esomeprazole]; Nsaids; Penicillins; Shrimp [shellfish allergy]; Sulfa antibiotics; Azactam [aztreonam]; Ciprofloxacin; and Claritin-d 12 hour [loratadine-pseudoephedrine er]   Assessment / Plan:     Visit Diagnoses: Rheumatoid arthritis involving multiple sites with positive rheumatoid factor [facility]): Patient has no active synovitis on examination today.  She has been off methotrexate and Plaquenil due to possible sepsis.  She was treated with IV antibiotics in the hospital.  She was discharged on high-dose prednisone due to history of adrenal insufficiency.  She is still on prednisone 50 mg a day.  I will advised to taper by 5 mg every week.  I will also try to schedule an urgent visit for her with endocrinology.  She may need hydrocortisone.  She was treated by endocrinologist for adrenal insufficiency for many years.  I would resume methotrexate and Plaquenil at the follow-up visit if her creatinine gets better.  High risk medication use - PLQ, MTX,-are on hold currently.  Reclast-was given by her PCP in the past.  Prednisone-50 mg p.o. daily., tramadol eye exam: 11/8/2018UDS: 02/22/2017 Narc agreement 02/22/2017.  We will renew her narcotic agreement today and collect UDS.  Primary osteoarthritis of both hands :she has chronic pain and stiffness.  Primary osteoarthritis of both knees: Doing better  Primary osteoarthritis of both feet: Currently she is not having discomfort.  Age-related osteoporosis without current pathological fracture: Her DEXA is followed up by her PCP and treated by PCP with Reclast.  DDD (degenerative disc disease), cervical  Fibromyalgia: She does experience generalized pain. Long term current use of systemic steroids - Due to adrenal insufficiency. .  History of fatigue - due to deconditioning.  History of CHF (congestive heart failure)    Orders: Orders Placed This Encounter  Procedures  .  Ambulatory referral to Endocrinology   No orders of the defined types were placed in this encounter.   Face-to-face time spent with patient was 30 minutes. Greater than 50% of time was spent in counseling and coordination of care.  Follow-Up Instructions: Return in about 4 weeks (around 03/14/2018) for Rheumatoid arthritis.   Bo Merino, MD  Note - This record has been created using Editor, commissioning.  Chart creation errors have been sought, but may not always  have been located. Such creation errors do not reflect on  the standard of medical care.

## 2018-02-13 NOTE — Progress Notes (Signed)
Eileen Wilkerson is a 84 y.o. female is here for a hospital follow up.  History of Present Illness:   Eileen Wilkerson CMA acting as scribe for Dr. Juleen China.  HPI: Patient comes in today for Hospital follow up. She admitted x 2 over the last month for sepsis, with the first being urosepsis and the second was thought to be due to pneumonia. Her RA medications were held due to immunosuppression. She has been on Prednisone since leaving the hospital. Current dose is 30 in the am and 20 in the afternoon. She has had trouble sleeping at night.   Bone density: Patient has been on reclast for osteopetrosis. Last DEXA showing osteopenia.   Admit date:02/02/2018 Discharge date:02/06/2018  Admitted From:Home Disposition:Home with Home Health  Recommendations for Outpatient Follow-up: 1. Follow up with PCP in 1-2 weeks 2. Follow up with Rheumatology within 1 week 3. Please obtainCMP/CBC, Mag, Phosin one week 4. Repeat CXR in 3-6 weeks 5. Please follow up on the following pending results:  Home Health:YES Equipment/Devices:None recommended by PT/OT  Review of Systems  Constitutional: Negative for chills and fever.  HENT: Positive for congestion.   Eyes: Negative for blurred vision and double vision.  Respiratory: Positive for shortness of breath.   Cardiovascular: Negative for chest pain and palpitations.  Gastrointestinal: Negative for nausea and vomiting.  Musculoskeletal: Positive for joint pain.       Hospital stopped RA medication.   Neurological: Negative for headaches.  Psychiatric/Behavioral: Negative for depression.    Health Maintenance Due  Topic Date Due  . TETANUS/TDAP  09/07/1951  . INFLUENZA VACCINE  07/11/2017   Depression screen PHQ 2/9 05/08/2017  Decreased Interest 0  Down, Depressed, Hopeless 0  PHQ - 2 Score 0   PMHx, SurgHx, SocialHx, FamHx, Medications, and Allergies were reviewed in the Visit Navigator and updated as appropriate.   Patient  Active Problem List   Diagnosis Date Noted  . AKI (acute kidney injury) ([place]) 02/14/2018  . Idiopathic chronic venous hypertension of both lower extremities with ulcer and inflammation ([place]) 09/18/2017  . Bilateral lower extremity edema 06/30/2017  . Physical deconditioning 05/30/2017  . Immunosuppressed status ([place]) 05/30/2017  . Abnormal chest x-ray 05/17/2017  . Hypokalemia 04/25/2017  . Fibromyalgia 12/18/2016  . DJD (degenerative joint disease), cervical 12/18/2016  . Spondylosis of lumbar region without myelopathy or radiculopathy 12/18/2016  . Trigger finger, right middle finger 12/18/2016  . Primary osteoarthritis of both hands 12/18/2016  . Primary osteoarthritis of both feet 12/18/2016  . Primary osteoarthritis of both knees 12/18/2016  . GERD (gastroesophageal reflux disease) 12/18/2016  . Age-related osteoporosis without current pathological fracture 12/18/2016  . Rheumatoid arthritis ([place]) 12/28/2015  . Long term current use of systemic steroids, for RA 12/28/2015  . Lymphocytosis 11/13/2012   Social History   Tobacco Use  . Smoking status: Former Smoker    Packs/day: 0.75    Years: 10.00    Pack years: 7.50    Types: Cigarettes    Last attempt to quit: 1968    Years since quitting: 51.2  . Smokeless tobacco: Never Used  Substance Use Topics  . Alcohol use: No  . Drug use: No   Current Medications and Allergies:   .  acetaminophen (TYLENOL) 650 MG CR tablet, Take 500 mg by mouth 3 (three) times daily. , Disp: , Rfl:  .  atorvastatin (LIPITOR) 20 MG tablet, TAKE 1 TABLET BY MOUTH ONCE DAILY AT BEDTIME AS NEEDED (Patient taking differently: TAKE 1 TABLET  BY MOUTH ONCE DAILY AT BEDTIME), Disp: 90 tablet, Rfl: 1 .  Cholecalciferol (VITAMIN D3) 5000 units CAPS, Take 5,000 Units by mouth daily., Disp: , Rfl:  .  esomeprazole (NEXIUM) 20 MG packet, Take 20 mg by mouth 2 (two) times daily., Disp: , Rfl:  .  fluticasone (FLONASE) 50 MCG/ACT nasal spray, Place 2 sprays  into both nostrils daily., Disp: 16 g, Rfl: 0 .  folic acid (FOLVITE) 1 MG tablet, Take 1 mg by mouth daily., Disp: , Rfl:  .  furosemide (LASIX) 80 MG tablet, Take 1 tablet (80 mg total) by mouth daily., Disp: 30 tablet, Rfl: 5 .  potassium chloride SA (K-DUR,KLOR-CON) 20 MEQ tablet, Take 2 tablets (40 mEq total) by mouth daily., Disp: 30 tablet, Rfl: 0 .  traMADol (ULTRAM) 50 MG tablet, TAKE 1 TABLET BY MOUTH TWICE DAILY AS NEEDED (Patient taking differently: TAKE 1 TABLET BY MOUTH TWICE DAILY AS NEEDED PAIN), Disp: 60 tablet, Rfl: 0  Allergies  Allergen Reactions  . Adhesive [Tape] Rash  . Celebrex [Celecoxib] Hives  . Ciprofibrate Nausea Only  . Cymbalta [Duloxetine Hcl] Swelling  . Gabitril [Tiagabine] Swelling  . Keflex [Cephalexin] Nausea And Vomiting  . Lyrica [Pregabalin] Swelling  . Neurontin [Gabapentin] Swelling  . Nexium [Esomeprazole] Rash  . Nsaids Rash  . Penicillins Rash    Injection site reaction. Tolerated cefepime in past Has patient had a PCN reaction causing immediate rash, facial/tongue/throat swelling, SOB or lightheadedness with hypotension: No Has patient had a PCN reaction causing severe rash involving mucus membranes or skin necrosis: No Has patient had a PCN reaction that required hospitalization No Has patient had a PCN reaction occurring within the last 10 years: No If all of the above answers are "NO", then may proceed with Cephalosporin use.   . Shrimp [Shellfish Allergy] Anaphylaxis    Per patient "shrimp only"  . Sulfa Antibiotics Nausea And Vomiting  . Azactam [Aztreonam]     Hand swelling   . Ciprofloxacin Other (See Comments)    dizziness  . Claritin-D 12 Hour [Loratadine-Pseudoephedrine Er] Anxiety   Review of Systems   Pertinent items are noted in the HPI. Otherwise, ROS is negative.  Vitals:   Vitals:   02/13/18 1008  BP: (!) 150/90  Pulse: 74  SpO2: 100%  Weight: 170 lb (77.1 kg)  Height: 5' (1.524 m)     Body mass index is  33.2 kg/m.  Physical Exam:   Physical Exam  Constitutional: She is oriented to person, place, and time. She appears well-developed and well-nourished. No distress.  HENT:  Head: Normocephalic and atraumatic.  Eyes: Conjunctivae and EOM are normal. Pupils are equal, round, and reactive to light.  Neck: Normal range of motion. Neck supple. No thyromegaly present.  Cardiovascular: Normal rate, regular rhythm and intact distal pulses.  Pulmonary/Chest: Effort normal and breath sounds normal.  Abdominal: Soft. Bowel sounds are normal.  Neurological: She is alert and oriented to person, place, and time.  Skin: Skin is warm.  Psychiatric: She has a normal mood and affect. Her behavior is normal.  Nursing note and vitals reviewed.   Results for orders placed or performed in visit on 02/13/18  CBC with Differential/Platelet  Result Value Ref Range   WBC 19.0 Repeated and verified X2. (HH) 4.0 - 10.5 K/uL   RBC 4.08 3.87 - 5.11 Mil/uL   Hemoglobin 13.4 12.0 - 15.0 g/dL   HCT 41.2 36.0 - 46.0 %   MCV 100.8 (H) 78.0 - 100.0  fl   MCHC 32.5 30.0 - 36.0 g/dL   RDW 16.1 (H) 11.5 - 15.5 %   Platelets 307.0 150.0 - 400.0 K/uL   Neutrophils Relative % 79.8 (H) 43.0 - 77.0 %   Lymphocytes Relative 11.8 (L) 12.0 - 46.0 %   Monocytes Relative 8.0 3.0 - 12.0 %   Eosinophils Relative 0.1 0.0 - 5.0 %   Basophils Relative 0.3 0.0 - 3.0 %   Neutro Abs 15.1 (H) 1.4 - 7.7 K/uL   Lymphs Abs 2.2 0.7 - 4.0 K/uL   Monocytes Absolute 1.5 (H) 0.1 - 1.0 K/uL   Eosinophils Absolute 0.0 0.0 - 0.7 K/uL   Basophils Absolute 0.1 0.0 - 0.1 K/uL  Comprehensive metabolic panel  Result Value Ref Range   Sodium 138 135 - 145 mEq/L   Potassium 4.9 3.5 - 5.1 mEq/L   Chloride 99 96 - 112 mEq/L   CO2 30 19 - 32 mEq/L   Glucose, Bld 95 70 - 99 mg/dL   BUN 37 (H) 6 - 23 mg/dL   Creatinine, Ser 1.26 (H) 0.40 - 1.20 mg/dL   Total Bilirubin 0.7 0.2 - 1.2 mg/dL   Alkaline Phosphatase 42 39 - 117 U/L   AST 20 0 - 37 U/L     ALT 25 0 - 35 U/L   Total Protein 7.8 6.0 - 8.3 g/dL   Albumin 4.1 3.5 - 5.2 g/dL   Calcium 11.7 (H) 8.4 - 10.5 mg/dL   GFR 43.06 (L) >60.00 mL/min  Magnesium  Result Value Ref Range   Magnesium 2.1 1.5 - 2.5 mg/dL  Phosphorus  Result Value Ref Range   Phosphorus 4.5 2.3 - 4.6 mg/dL  Urinalysis, Routine w reflex microscopic  Result Value Ref Range   Color, Urine YELLOW Yellow;Lt. Yellow   APPearance CLEAR Clear   Specific Gravity, Urine <=1.005 (A) 1.000 - 1.030   pH 6.5 5.0 - 8.0   Total Protein, Urine NEGATIVE Negative   Urine Glucose NEGATIVE Negative   Ketones, ur NEGATIVE Negative   Bilirubin Urine NEGATIVE Negative   Hgb urine dipstick NEGATIVE Negative   Urobilinogen, UA 0.2 0.0 - 1.0   Leukocytes, UA NEGATIVE Negative   Nitrite NEGATIVE Negative   WBC, UA 0-2/hpf 0-2/hpf   RBC / HPF 0-2/hpf 0-2/hpf   Mucus, UA Presence of (A) None   Squamous Epithelial / LPF Rare(0-4/hpf) Rare(0-4/hpf)   Bacteria, UA Rare(<10/hpf) (A) None   A Chest X-Ray was ordered. My reading of this film is no acute findings.  Assessment and Plan:   Current Problem List updated with notes today.   Patient Active Problem List   Diagnosis Date Noted  . AKI (acute kidney injury) ([place]) 02/14/2018  . Idiopathic chronic venous hypertension of both lower extremities with ulcer and inflammation ([place]) 09/18/2017  . Bilateral lower extremity edema 06/30/2017  . Physical deconditioning 05/30/2017  . Immunosuppressed status ([place]) 05/30/2017  . Abnormal chest x-ray 05/17/2017  . Hypokalemia 04/25/2017  . Fibromyalgia 12/18/2016  . DJD (degenerative joint disease), cervical 12/18/2016  . Spondylosis of lumbar region without myelopathy or radiculopathy 12/18/2016  . Trigger finger, right middle finger 12/18/2016  . Primary osteoarthritis of both hands 12/18/2016  . Primary osteoarthritis of both feet 12/18/2016  . Primary osteoarthritis of both knees 12/18/2016  . GERD (gastroesophageal reflux  disease) 12/18/2016  . Age-related osteoporosis without current pathological fracture 12/18/2016  . Rheumatoid arthritis ([place]) 12/28/2015  . Long term current use of systemic steroids, for RA 12/28/2015  .  Lymphocytosis 11/13/2012   Diagnoses and all orders for this visit:  Hospital discharge follow-up Comments: Medication reconciliation:  [x]   Medication list updated [x]   New medication list given to patient/family/caregiver  Referrals: []   None needed [x]   Referrals made to: will need to see Rheumatology and Endocrinology  Community resources identified for patient/family:  []   None needed  []   Home health agency []   Assisted living  []   Hospice  []   Support group  []   Education program  Durable medical equipment ordered:  []   None needed  []   DME ordered:   Additional communication delivered or planned:  []   Family/Caregiver:  []   Specialists:  []   Other:  Patient education: Topics discussed: AS ABOVE Handouts given: SEE AVS  Initial transitional care contact was made on 02/07/18 (see separate note).  History of UTI Comments: With recent hospital stay for urosepsis. Will recheck UA today. Orders: -     Urinalysis, Routine w reflex microscopic -     Urine Culture  Fatigue Comments: Likely due to recent illness x 2 and current deconditioning. Reviewed PT, hydration. Expectations reviewed.  Orders: -     CBC with Differential/Platelet -     Comprehensive metabolic panel -     Magnesium -     Phosphorus -     Urinalysis, Routine w reflex microscopic -     Urine Culture  Cough Comments: Repeat CXR today without acute findings. It is noted that the presumed PNA was diagnosed via CT abdomen findings.  Orders: -     DG Chest 2 View; Future  Long term current use of systemic steroids, for RA Comments: Patient will call to see her Rheumatologist for guidance re: prednisone and RA medications.   AKI (acute kidney injury) ([place]) Comments:  Lab Results    Component Value Date   CREATININE 1.26 (H) 02/13/2018   CREATININE 0.67 02/06/2018   CREATININE 0.75 02/05/2018   Lymphocytosis Comments: Patient is taking high doses of prednisone.   Orders Placed This Encounter  Procedures  . Urine Culture  . DG Chest 2 View  . CBC with Differential/Platelet  . Comprehensive metabolic panel  . Magnesium  . Phosphorus  . Urinalysis, Routine w reflex microscopic   Meds ordered this encounter  Medications  . atorvastatin (LIPITOR) 20 MG tablet    Sig: Take 1 tablet (20 mg total) by mouth at bedtime.    Dispense:  90 tablet    Refill:  3    . Reviewed expectations re: course of current medical issues. . Discussed self-management of symptoms. . Outlined signs and symptoms indicating need for more acute intervention. . Patient verbalized understanding and all questions were answered. Marland Kitchen Health Maintenance issues including appropriate healthy diet, exercise, and smoking avoidance were discussed with patient. . See orders for this visit as documented in the electronic medical record. . Patient received an After Visit Summary.  CMA served as Education administrator during this visit. History, Physical, and Plan performed by medical provider. The above documentation has been reviewed and is accurate and complete. Briscoe Deutscher, D.O.  Briscoe Deutscher, DO [place], [facility] 02/14/2018

## 2018-02-14 ENCOUNTER — Encounter: Payer: Self-pay | Admitting: Rheumatology

## 2018-02-14 ENCOUNTER — Encounter: Payer: Self-pay | Admitting: Family Medicine

## 2018-02-14 ENCOUNTER — Ambulatory Visit: Payer: Medicare Other | Admitting: Rheumatology

## 2018-02-14 VITALS — BP 128/78 | HR 75 | Resp 16 | Ht 63.0 in | Wt 163.0 lb

## 2018-02-14 DIAGNOSIS — M19041 Primary osteoarthritis, right hand: Secondary | ICD-10-CM | POA: Diagnosis not present

## 2018-02-14 DIAGNOSIS — M797 Fibromyalgia: Secondary | ICD-10-CM

## 2018-02-14 DIAGNOSIS — M0579 Rheumatoid arthritis with rheumatoid factor of multiple sites without organ or systems involvement: Secondary | ICD-10-CM | POA: Diagnosis not present

## 2018-02-14 DIAGNOSIS — M19042 Primary osteoarthritis, left hand: Secondary | ICD-10-CM

## 2018-02-14 DIAGNOSIS — M810 Age-related osteoporosis without current pathological fracture: Secondary | ICD-10-CM | POA: Diagnosis not present

## 2018-02-14 DIAGNOSIS — M19072 Primary osteoarthritis, left ankle and foot: Secondary | ICD-10-CM

## 2018-02-14 DIAGNOSIS — M5030 Other cervical disc degeneration, unspecified cervical region: Secondary | ICD-10-CM

## 2018-02-14 DIAGNOSIS — Z79899 Other long term (current) drug therapy: Secondary | ICD-10-CM | POA: Diagnosis not present

## 2018-02-14 DIAGNOSIS — Z8679 Personal history of other diseases of the circulatory system: Secondary | ICD-10-CM

## 2018-02-14 DIAGNOSIS — M19071 Primary osteoarthritis, right ankle and foot: Secondary | ICD-10-CM | POA: Diagnosis not present

## 2018-02-14 DIAGNOSIS — Z5181 Encounter for therapeutic drug level monitoring: Secondary | ICD-10-CM | POA: Diagnosis not present

## 2018-02-14 DIAGNOSIS — Z87898 Personal history of other specified conditions: Secondary | ICD-10-CM

## 2018-02-14 DIAGNOSIS — N183 Chronic kidney disease, stage 3 unspecified: Secondary | ICD-10-CM | POA: Insufficient documentation

## 2018-02-14 DIAGNOSIS — Z7952 Long term (current) use of systemic steroids: Secondary | ICD-10-CM | POA: Diagnosis not present

## 2018-02-14 DIAGNOSIS — M170 Bilateral primary osteoarthritis of knee: Secondary | ICD-10-CM

## 2018-02-14 DIAGNOSIS — N179 Acute kidney failure, unspecified: Secondary | ICD-10-CM | POA: Insufficient documentation

## 2018-02-14 LAB — URINE CULTURE
MICRO NUMBER:: 90288504
Result:: NO GROWTH
SPECIMEN QUALITY:: ADEQUATE

## 2018-02-14 MED ORDER — ATORVASTATIN CALCIUM 20 MG PO TABS: 20.0000 mg | ORAL_TABLET | Freq: Every day | ORAL | 3 refills | Status: DC

## 2018-02-14 MED ORDER — PREDNISONE 5 MG PO TABS: ORAL_TABLET | ORAL | 0 refills | Status: DC

## 2018-02-14 NOTE — Patient Instructions (Signed)
Prednisone Taper Schedule  Prednisone 45 mg for 1 week February 15, 2018-February 21, 2018 Prednisone 40 mg for 1 week February 22, 2018- February 28, 2018 Prednisone 35 mg for 1 week March 01, 2018- March 07, 2018

## 2018-02-17 ENCOUNTER — Other Ambulatory Visit: Payer: Self-pay | Admitting: Family Medicine

## 2018-02-18 ENCOUNTER — Inpatient Hospital Stay: Payer: Medicare Other | Admitting: Family Medicine

## 2018-02-18 LAB — PAIN MGMT, PROFILE 5 W/CONF, U
Amphetamines: NEGATIVE ng/mL (ref <500)
Barbiturates: NEGATIVE ng/mL (ref <300)
Benzodiazepines: NEGATIVE ng/mL (ref <100)
Cocaine Metabolite: NEGATIVE ng/mL (ref <150)
Creatinine: 21.4 mg/dL
Marijuana Metabolite: NEGATIVE ng/mL (ref <20)
Methadone Metabolite: NEGATIVE ng/mL (ref <100)
Opiates: NEGATIVE ng/mL (ref <100)
Oxidant: NEGATIVE ug/mL (ref <200)
Oxycodone: NEGATIVE ng/mL (ref <100)
pH: 7.1 (ref 4.5–9.0)

## 2018-02-18 LAB — PAIN MGMT, TRAMADOL W/MEDMATCH, U
Desmethyltramadol: 2135 ng/mL — ABNORMAL HIGH (ref <100)
Tramadol: 5798 ng/mL — ABNORMAL HIGH (ref <100)

## 2018-02-19 ENCOUNTER — Other Ambulatory Visit: Payer: Self-pay | Admitting: Family Medicine

## 2018-02-19 NOTE — Telephone Encounter (Signed)
Okay to change as below.

## 2018-02-19 NOTE — Telephone Encounter (Signed)
Okay to do?

## 2018-02-19 NOTE — Telephone Encounter (Signed)
Ok to make change per patient request?

## 2018-02-19 NOTE — Progress Notes (Signed)
C/w tt

## 2018-02-19 NOTE — Telephone Encounter (Signed)
Please advise on refill.

## 2018-02-20 ENCOUNTER — Other Ambulatory Visit: Payer: Self-pay | Admitting: Physician Assistant

## 2018-02-20 NOTE — Telephone Encounter (Signed)
I am getting an alert that she had this refilled by Dr. Juleen China on 02/14/18.  Please call pharmacy to see if this prescription was filled. Do not refill if it was refilled on 02/14/18.

## 2018-02-20 NOTE — Telephone Encounter (Signed)
Spoke with April at [facility] and she states this medication has not been filled by Briscoe Deutscher, DO. She states is was filled by you last month and Dr. Estanislado Pandy in January.   Last Visit: 02/14/18 Next Visit: 03/14/18 UDS: 02/14/18 Narc Agreement: 02/14/18  Okay to refill tramadol?

## 2018-02-21 ENCOUNTER — Encounter: Payer: Self-pay | Admitting: Endocrinology

## 2018-02-21 ENCOUNTER — Ambulatory Visit: Payer: Medicare Other | Admitting: Endocrinology

## 2018-02-21 ENCOUNTER — Other Ambulatory Visit: Payer: Self-pay | Admitting: Family Medicine

## 2018-02-21 DIAGNOSIS — R600 Localized edema: Secondary | ICD-10-CM

## 2018-02-21 DIAGNOSIS — T380X5A Adverse effect of glucocorticoids and synthetic analogues, initial encounter: Secondary | ICD-10-CM | POA: Diagnosis not present

## 2018-02-21 DIAGNOSIS — E2740 Unspecified adrenocortical insufficiency: Secondary | ICD-10-CM

## 2018-02-21 LAB — RENAL FUNCTION PANEL
Albumin: 4.2 g/dL (ref 3.5–5.2)
BUN: 42 mg/dL — ABNORMAL HIGH (ref 6–23)
CO2: 33 mEq/L — ABNORMAL HIGH (ref 19–32)
Calcium: 10.7 mg/dL — ABNORMAL HIGH (ref 8.4–10.5)
Chloride: 98 mEq/L (ref 96–112)
Creatinine, Ser: 1.45 mg/dL — ABNORMAL HIGH (ref 0.40–1.20)
GFR: 36.61 mL/min — ABNORMAL LOW (ref >60.00)
Glucose, Bld: 125 mg/dL — ABNORMAL HIGH (ref 70–99)
Phosphorus: 2.7 mg/dL (ref 2.3–4.6)
Potassium: 4.8 mEq/L (ref 3.5–5.1)
Sodium: 137 mEq/L (ref 135–145)

## 2018-02-21 LAB — VITAMIN D 25 HYDROXY (VIT D DEFICIENCY, FRACTURES): VITD: 51.95 ng/mL (ref 30.00–100.00)

## 2018-02-21 NOTE — Progress Notes (Signed)
Patient ID: Eileen Wilkerson, female   DOB: June 30, 1932, 84 y.o.   MRN: [id number]          Referring physician: Briscoe Deutscher, S. Deveshwar  Chief complaint: Adjust steroid dosage  History of Present Illness:   Several years ago the patient was having recurrent steroid injections for her back and was told that she had persistent adrenal suppression but no details are available She was told that her cortisol levels were low and she was started on prednisone and kept on a maintenance dose of 7 mg daily Subsequently she developed rheumatoid arthritis and was told to continue prednisone for this reason also  She had been followed periodically by an endocrinologist until about a year and a half ago for monitoring her prednisone doses  Generally she had been feeling fairly good with no lightheadedness, weakness, nausea or change in appetite However she did have problems with depression and decreased appetite with weight loss last fall after her husband's death  More recently she had 2 admissions to the hospital in February, once for UTI and another one for unknown febrile illness with GI symptoms Although she was not hypotensive at the admission time he had low normal blood pressure readings and was given higher doses of steroids, which doing and on discharge from the hospital on 02/06/18  Although she was discharged on 80 mg of prednisone this caused severe insomnia and this was reduced in a few days down to 50 mg More recently her rheumatologist has told her to reduce her dose by 5 mg weekly She has now taken starting this Tuesday 20 mg twice daily With this she had not had any withdrawal symptoms of weakness but still tending to have insomnia and increased appetite which she has done   Wt Readings from Last 3 Encounters:  02/21/18 161 lb (73 kg)  02/14/18 163 lb (73.9 kg)  02/13/18 170 lb (77.1 kg)    Past Medical History:  Diagnosis Date  . Adrenal failure ([place])   . Arthritis   .  Cataract   . Closed nondisplaced fracture of fifth right metatarsal bone 09/18/2017  . Fibromyalgia 2008  . HCAP (healthcare-associated pneumonia) 02/03/2018  . Osteoporosis   . RA (rheumatoid arthritis) ([place])   . Sepsis due to urinary tract infection ([place]) 01/18/2018    Past Surgical History:  Procedure Laterality Date  . BACK SURGERY    . BILATERAL CARPAL TUNNEL RELEASE    . CATARACT EXTRACTION, BILATERAL  2004  . CERVICAL FUSION  2011,2010,2008  . HEEL SPUR SURGERY  2004  . ROTATOR CUFF REPAIR    . TONSILLECTOMY AND ADENOIDECTOMY  1947  . TOTAL SHOULDER ARTHROPLASTY      Family History  Problem Relation Age of Onset  . Breast cancer Mother   . Diabetes Father     Social History:  reports that she quit smoking about 51 years ago. Her smoking use included cigarettes. She has a 7.50 pack-year smoking history. she has never used smokeless tobacco. She reports that she does not drink alcohol or use drugs.  Allergies:  Allergies  Allergen Reactions  . Adhesive [Tape] Rash  . Celebrex [Celecoxib] Hives  . Ciprofibrate Nausea Only  . Cymbalta [Duloxetine Hcl] Swelling  . Gabitril [Tiagabine] Swelling  . Keflex [Cephalexin] Nausea And Vomiting  . Lyrica [Pregabalin] Swelling  . Neurontin [Gabapentin] Swelling  . Nexium [Esomeprazole] Rash  . Nsaids Rash  . Penicillins Rash    Injection site reaction. Tolerated cefepime in past Has  patient had a PCN reaction causing immediate rash, facial/tongue/throat swelling, SOB or lightheadedness with hypotension: No Has patient had a PCN reaction causing severe rash involving mucus membranes or skin necrosis: No Has patient had a PCN reaction that required hospitalization No Has patient had a PCN reaction occurring within the last 10 years: No If all of the above answers are "NO", then may proceed with Cephalosporin use.   . Shrimp [Shellfish Allergy] Anaphylaxis    Per patient "shrimp only"  . Sulfa Antibiotics Nausea And Vomiting  .  Azactam [Aztreonam]     Hand swelling   . Ciprofloxacin Other (See Comments)    dizziness  . Claritin-D 12 Hour [Loratadine-Pseudoephedrine Er] Anxiety    Allergies as of 02/21/2018      Reactions   Adhesive [tape] Rash   Celebrex [celecoxib] Hives   Ciprofibrate Nausea Only   Cymbalta [duloxetine Hcl] Swelling   Gabitril [tiagabine] Swelling   Keflex [cephalexin] Nausea And Vomiting   Lyrica [pregabalin] Swelling   Neurontin [gabapentin] Swelling   Nexium [esomeprazole] Rash   Nsaids Rash   Penicillins Rash   Injection site reaction. Tolerated cefepime in past Has patient had a PCN reaction causing immediate rash, facial/tongue/throat swelling, SOB or lightheadedness with hypotension: No Has patient had a PCN reaction causing severe rash involving mucus membranes or skin necrosis: No Has patient had a PCN reaction that required hospitalization No Has patient had a PCN reaction occurring within the last 10 years: No If all of the above answers are "NO", then may proceed with Cephalosporin use.   Shrimp [shellfish Allergy] Anaphylaxis   Per patient "shrimp only"   Sulfa Antibiotics Nausea And Vomiting   Azactam [aztreonam]    Hand swelling    Ciprofloxacin Other (See Comments)   dizziness   Claritin-d 12 Hour [loratadine-pseudoephedrine Er] Anxiety      Medication List        Accurate as of 02/21/18 10:08 AM. Always use your most recent med list.          acetaminophen 650 MG CR tablet Commonly known as:  TYLENOL Take 500 mg by mouth 3 (three) times daily.   atorvastatin 20 MG tablet Commonly known as:  LIPITOR Take 1 tablet (20 mg total) by mouth at bedtime.   BIOTIN MAXIMUM STRENGTH 10 MG Tabs Generic drug:  Biotin Take 10 mg by mouth daily.   esomeprazole 20 MG packet Commonly known as:  NEXIUM Take 20 mg by mouth 2 (two) times daily.   fluticasone 50 MCG/ACT nasal spray Commonly known as:  FLONASE Place 2 sprays into both nostrils daily.   folic acid 1  MG tablet Commonly known as:  FOLVITE Take 1 mg by mouth daily.   furosemide 80 MG tablet Commonly known as:  LASIX Take 1 tablet (80 mg total) by mouth daily.   hydroxychloroquine 200 MG tablet Commonly known as:  PLAQUENIL Take 1 tablet (200 mg total) by mouth daily. Resume when ok by Rheumatology as an outpatient   OTREXUP 15 MG/0.4ML [place] Generic drug:  Methotrexate (PF) INJECT ONE PEN SUBCUTANEOUSLY ONCE EVERY WEEK. STORE AT ROOM TEMPERATURE BETWEEN 68 - 77 DEGREES F.   potassium chloride SA 20 MEQ tablet Commonly known as:  K-DUR,KLOR-CON Take 2 tablets (40 mEq total) by mouth daily.   predniSONE 10 MG tablet Commonly known as:  DELTASONE 20 mg in am and 20 mg in afternoon for RA   traMADol 50 MG tablet Commonly known as:  ULTRAM TAKE 1 TABLET  BY MOUTH TWICE DAILY AS NEEDED   Vitamin D3 5000 units Caps Take 5,000 Units by mouth daily.       LABS:   No results found for: VD25OH       Review of Systems  Constitutional: Negative for weight gain.  HENT: Negative for trouble swallowing.   Respiratory: Negative for shortness of breath.        Rarely may have trouble breathing.  Has a history of childhood asthma  Cardiovascular: Positive for leg swelling.       Erica: Has history of leg swelling, taking Lasix for this  Gastrointestinal: Negative for abdominal pain.  Endocrine: Negative for fatigue.  Genitourinary: Negative for dysuria.  Musculoskeletal: Positive for joint pain and back pain.       Rheumatoid arthritis that affects mostly her hands and some in her feet She has osteopenia but she thinks she has lost significant amount of height  Neurological: Negative for numbness.  Psychiatric/Behavioral: Negative for nervousness.   , Likely is getting excessive doses of Lasix 80 mg now  PHYSICAL EXAM:  BP 130/80 (BP Location: Left Arm, Patient Position: Sitting, Cuff Size: Normal)   Pulse (!) 104   Ht 5\' 3"  (1.6 m)   Wt 161 lb (73 kg)   BMI 28.52 kg/m     GENERAL: Well-built and nourished Has minimal supraclavicular fat pad visible on the left but no buffalo hump  No pallor, clubbing, lymphadenopathy or edema.  Skin:  no rash or pigmentation.  Only minimal ecchymoses on the distal left forearm seen  EYES:  Externally normal.  ENT: Oral mucosa and tongue normal.  THYROID:  Not palpable.  HEART:  Normal  S1 and S2; no murmur or click.  CHEST:  Normal shape.  Lungs: Vescicular breath sounds heard equally.  No crepitations/ wheeze.  ABDOMEN:  No distention.  Liver and spleen not palpable.  No other mass or tenderness.  NEUROLOGICAL: .Reflexes are bilaterally normal at biceps   JOINTS: She has some swelling and mild deformity of the second and third proximal interphalangeal joints bilaterally   ASSESSMENT:    Long-term use of corticosteroids with history of adrenal suppression.  Details of previous history are not available.  Currently patient is on high-dose steroids since her recent hospitalization.  Discussed that it is unlikely that she needs to be on such a high dose of steroids and since she was previously only on a maintenance dose of 7 mg she should be able to taper down her prednisone fairly rapidly from her current dose of 40 mg daily  Insomnia related to high-dose steroids, she is taking 20 mg twice daily and may benefit from both reduction in the dosage and using a larger amount in the morning compared to the afternoon  Recent hypercalcemia of unclear etiology, she does take relatively high dose vitamin D but no calcium supplements   PLAN:    Tapering dose of prednisone starting today.  She knows to call if she has symptoms of unusual weakness, lightheadedness, nausea or loss of appetite  Final dose of prednisone will be 7 mg daily using 5 mg in the morning and 2 mg in the afternoon  She was made aware of the fact that when she is on the maintenance dose she will need stress doses if she has any acute illness with  2-3 times normal dose  Check vitamin D and repeat calcium, may check PTH if calcium is still high   Consultation note sent to the referring  physician  Prednisone tapering schedule:  March 14-17: Take 20 mg in the morning and 15 mg in the afternoon  March 18-21: Take 20 mg in the morning and 10 mg in the afternoon  March 21-24: Take 15 mg in the morning and 10 mg in the afternoon  March 25-28: Take 10 mg in the morning and 7 mg in the afternoon  March 29 - April 2: Take 10 mg in the morning and 5 mg in the afternoon  April 2-April 7: Take 10 mg in the morning and 2 mg in the afternoon  April 7-April 10 =7 mg in the morning and 2 mg in the afternoon  April 15 onwards: Take 5 mg in the morning and 2 mg in the afternoon  If you start feeling weak/lightheaded, losing appetite or feeling nauseated please let me know and we can slow down the prednisone taper   Patient Instructions  Increase fluid intake  Prednisone tapering schedule:  March 14-17: Take 20 mg in the morning and 15 mg in the afternoon  March 18-21: Take 20 mg in the morning and 10 mg in the afternoon  March 21-24: Take 15 mg in the morning and 10 mg in the afternoon  March 25-28: Take 10 mg in the morning and 7 mg in the afternoon  March 29 - April 2: Take 10 mg in the morning and 5 mg in the afternoon  April 2-April 7: Take 10 mg in the morning and 2 mg in the afternoon  April 7-April 10 =7 mg in the morning and 2 mg in the afternoon  April 15 onwards: Take 5 mg in the morning and 2 mg in the afternoon  If you start feeling weak/lightheaded, losing appetite or feeling nauseated please let me know and we can slow down the prednisone taper   Elayne Snare 02/21/2018, 10:08 AM   Addendum: Calcium is 10.7, will check PTH, also she needs to follow-up with PCP regarding increased creatinine; likely is getting excessive doses of Lasix  Vitamin D normal

## 2018-02-21 NOTE — Patient Instructions (Addendum)
Increase fluid intake  Prednisone tapering schedule:  March 14-17: Take 20 mg in the morning and 15 mg in the afternoon  March 18-21: Take 20 mg in the morning and 10 mg in the afternoon  March 21-24: Take 15 mg in the morning and 10 mg in the afternoon  March 25-28: Take 10 mg in the morning and 7 mg in the afternoon  March 29 - April 2: Take 10 mg in the morning and 5 mg in the afternoon  April 2-April 7: Take 10 mg in the morning and 2 mg in the afternoon  April 7-April 10 =7 mg in the morning and 2 mg in the afternoon  April 15 onwards: Take 5 mg in the morning and 2 mg in the afternoon  If you start feeling weak/lightheaded, losing appetite or feeling nauseated please let me know and we can slow down the prednisone taper

## 2018-02-22 ENCOUNTER — Telehealth: Payer: Self-pay

## 2018-02-22 LAB — PARATHYROID HORMONE, INTACT (NO CA): PTH: 27 pg/mL (ref 15–65)

## 2018-02-22 MED ORDER — TRAMADOL HCL 50 MG PO TABS: ORAL_TABLET | ORAL | 0 refills | Status: DC

## 2018-02-22 NOTE — Telephone Encounter (Signed)
Called patient to advise she d/c MTX and patient states she has not taken MTX since January. Patient advised to have labs rechecked in 3 weeks to check Cr. Patient verbalized understanding.

## 2018-02-22 NOTE — Progress Notes (Signed)
Please ask the patient to decrease her Lasix by 1/2 for the next week and come in for a recheck. Labs indicate that she is too dry. Ask her to monitor her weight.

## 2018-02-22 NOTE — Telephone Encounter (Signed)
OK to refill

## 2018-02-22 NOTE — Progress Notes (Signed)
Called pt, appt 02/28/18.

## 2018-02-22 NOTE — Telephone Encounter (Signed)
Marcie Bal Faxed

## 2018-02-22 NOTE — Telephone Encounter (Signed)
See other message re: medication dosage decrease due to elevating creatinine. Okay refill. Cut in half.

## 2018-02-22 NOTE — Telephone Encounter (Signed)
-----   Message from Bo Merino, MD sent at 02/22/2018  1:20 PM EDT ----- Please, have patient discontinue MTX due to elevation of Cr. WE should repeat BMP in 3 weeks after discontinuing MTX.  SD ----- Message ----- From: Elayne Snare, MD Sent: 02/21/2018   9:04 PM To: Bo Merino, MD, Briscoe Deutscher, DO

## 2018-02-27 NOTE — Telephone Encounter (Signed)
Spoke with patient and let her know that she should be taking half of the lasix instead of full dose. Patient stated that someone had called her about this. Refill sent to the pharmacy.

## 2018-02-28 ENCOUNTER — Other Ambulatory Visit: Payer: Medicare Other

## 2018-02-28 ENCOUNTER — Telehealth: Payer: Self-pay

## 2018-02-28 ENCOUNTER — Telehealth: Payer: Self-pay | Admitting: Family Medicine

## 2018-02-28 ENCOUNTER — Other Ambulatory Visit: Payer: Self-pay

## 2018-02-28 MED ORDER — POTASSIUM CHLORIDE ER 10 MEQ PO TBCR: 10.0000 meq | EXTENDED_RELEASE_TABLET | Freq: Two times a day (BID) | ORAL | 0 refills | Status: DC

## 2018-02-28 NOTE — Telephone Encounter (Signed)
Copied from [place] [phone]. Topic: Quick Communication - See Telephone Encounter >> Feb 28, 2018 10:17 AM Boyd Kerbs wrote: CRM for notification. See Telephone encounter for: 02/28/18.  [facility] - [phone]  - resumed services on 2/28 after hospitalization,  the next week pt. refused service and refused visit the following week - week of 11th.  Upset with service - she says no one out since 28th, and how long appt on 28th took.  Not happy with time they could have visit and hung up them.  Maybe someone at office can rectify this,  they take ownership on [facility] end on the time  Asking if should discharge her or if doctor wants to speak to pt. First.

## 2018-02-28 NOTE — Telephone Encounter (Signed)
Pt has app tomorrow.

## 2018-02-28 NOTE — Telephone Encounter (Signed)
Called patient reviewed information script called in. Will be in office tomorrow.

## 2018-02-28 NOTE — Telephone Encounter (Signed)
Patient came to office today thought that she was to have office visit with you today but was on schedule for labs. I did not see any lab orders for you but she was very upset and had several things that she wanted to address with you.  One being she finished potassium last week and has started having foot cramps wanted to know if she should start back.   She was told to 1/2 lasix and keep up with weight she has done and on Sunday Monday and Tuesday she had a two lb wt increase after that it leveled off. Wt today when she came in office is 164lb    . She has had some swelling in ankle last night inside b/l. With improvement when she woke up.   Pt has app with you tomorrow.

## 2018-02-28 NOTE — Telephone Encounter (Signed)
See note

## 2018-02-28 NOTE — Telephone Encounter (Signed)
Yes - restart potassium - okay to call in 10 mEq if too big - take BID. I'll speak with her tomorrow about the Lasix and HH.

## 2018-03-01 ENCOUNTER — Encounter: Payer: Self-pay | Admitting: Family Medicine

## 2018-03-01 ENCOUNTER — Ambulatory Visit: Payer: Medicare Other | Admitting: Family Medicine

## 2018-03-01 VITALS — BP 112/74 | HR 85 | Temp 97.8°F | Ht 63.0 in | Wt 165.0 lb

## 2018-03-01 DIAGNOSIS — N179 Acute kidney failure, unspecified: Secondary | ICD-10-CM | POA: Diagnosis not present

## 2018-03-01 DIAGNOSIS — R600 Localized edema: Secondary | ICD-10-CM | POA: Diagnosis not present

## 2018-03-01 DIAGNOSIS — M810 Age-related osteoporosis without current pathological fracture: Secondary | ICD-10-CM

## 2018-03-01 DIAGNOSIS — Z111 Encounter for screening for respiratory tuberculosis: Secondary | ICD-10-CM

## 2018-03-01 DIAGNOSIS — M0579 Rheumatoid arthritis with rheumatoid factor of multiple sites without organ or systems involvement: Secondary | ICD-10-CM | POA: Diagnosis not present

## 2018-03-01 DIAGNOSIS — Z7952 Long term (current) use of systemic steroids: Secondary | ICD-10-CM

## 2018-03-01 DIAGNOSIS — K219 Gastro-esophageal reflux disease without esophagitis: Secondary | ICD-10-CM | POA: Diagnosis not present

## 2018-03-01 MED ORDER — PANTOPRAZOLE SODIUM 40 MG PO TBEC: 40.0000 mg | DELAYED_RELEASE_TABLET | Freq: Every day | ORAL | 3 refills | Status: DC

## 2018-03-01 NOTE — Progress Notes (Signed)
Eileen Wilkerson is a 84 y.o. female is here for follow up.  History of Present Illness:   Eileen Wilkerson, CMA acting as scribe for Dr. Briscoe Deutscher.   HPI:   B-12 patient never started on B -12 injections. Will give today.  GERD: patient wanted to know if she can start Protonix the medication she was on in the past in on back order.  Lasix: we had told her to change to 1/2 tab daily. We will check her kidney function today and let her know if we can increase.  UTI: she has been having burning in her bladder at night for the last few nights.  Patient states that she is going to [facility] and brought paper work to be filled out.   Appointment with Rheum on 4/4. Appointment with Endocrine on 4/12.  Health Maintenance Due  Topic Date Due  . TETANUS/TDAP  09/07/1951   Depression screen PHQ 2/9 05/08/2017  Decreased Interest 0  Down, Depressed, Hopeless 0  PHQ - 2 Score 0   PMHx, SurgHx, SocialHx, FamHx, Medications, and Allergies were reviewed in the Visit Navigator and updated as appropriate.   Patient Active Problem List   Diagnosis Date Noted  . AKI (acute kidney injury) ([place]) 02/14/2018  . Idiopathic chronic venous hypertension of both lower extremities with ulcer and inflammation ([place]) 09/18/2017  . Bilateral lower extremity edema 06/30/2017  . Physical deconditioning 05/30/2017  . Immunosuppressed status ([place]) 05/30/2017  . Abnormal chest x-ray 05/17/2017  . Hypokalemia 04/25/2017  . Fibromyalgia 12/18/2016  . DJD (degenerative joint disease), cervical 12/18/2016  . Spondylosis of lumbar region without myelopathy or radiculopathy 12/18/2016  . Trigger finger, right middle finger 12/18/2016  . Primary osteoarthritis of both hands 12/18/2016  . Primary osteoarthritis of both feet 12/18/2016  . Primary osteoarthritis of both knees 12/18/2016  . GERD (gastroesophageal reflux disease) 12/18/2016  . Age-related osteoporosis without current pathological fracture  12/18/2016  . Rheumatoid arthritis ([place]) 12/28/2015  . Long term current use of systemic steroids, for RA 12/28/2015  . Lymphocytosis 11/13/2012   Social History   Tobacco Use  . Smoking status: Former Smoker    Packs/day: 0.75    Years: 10.00    Pack years: 7.50    Types: Cigarettes    Last attempt to quit: 1968    Years since quitting: 51.2  . Smokeless tobacco: Never Used  Substance Use Topics  . Alcohol use: No  . Drug use: No   Current Medications and Allergies:   .  acetaminophen (TYLENOL) 650 MG CR tablet, Take 500 mg by mouth 3 (three) times daily. , Disp: , Rfl:  .  atorvastatin (LIPITOR) 20 MG tablet, Take 1 tablet (20 mg total) by mouth at bedtime., Disp: 90 tablet, Rfl: 3 .  Biotin (BIOTIN MAXIMUM STRENGTH) 10 MG TABS, Take 10 mg by mouth daily., Disp: , Rfl:  .  Cholecalciferol (VITAMIN D3) 5000 units CAPS, Take 5,000 Units by mouth daily., Disp: , Rfl:  .  fluticasone (FLONASE) 50 MCG/ACT nasal spray, Place 2 sprays into both nostrils daily., Disp: 16 g, Rfl: 0 .  folic acid (FOLVITE) 1 MG tablet, Take 1 mg by mouth daily., Disp: , Rfl:  .  furosemide (LASIX) 40 MG tablet, Take 40 mg by mouth daily., Disp: , Rfl:  .  potassium chloride (K-DUR) 10 MEQ tablet, Take 1 tablet (10 mEq total) by mouth 2 (two) times daily., Disp: 60 tablet, Rfl: 0 .  predniSONE (DELTASONE) 10 MG  tablet, 20 mg in am and 20 mg in afternoon for RA, Disp: 100 tablet, Rfl: 1 .  traMADol (ULTRAM) 50 MG tablet, TAKE 1 TABLET BY MOUTH THREE DAILY AS NEEDED PAIN, Disp: 90 tablet, Rfl: 0   Allergies  Allergen Reactions  . Adhesive [Tape] Rash  . Celebrex [Celecoxib] Hives  . Ciprofibrate Nausea Only  . Cymbalta [Duloxetine Hcl] Swelling  . Gabitril [Tiagabine] Swelling  . Keflex [Cephalexin] Nausea And Vomiting  . Lyrica [Pregabalin] Swelling  . Neurontin [Gabapentin] Swelling  . Nexium [Esomeprazole] Rash  . Nsaids Rash  . Penicillins Rash    Injection site reaction. Tolerated cefepime in  past Has patient had a PCN reaction causing immediate rash, facial/tongue/throat swelling, SOB or lightheadedness with hypotension: No Has patient had a PCN reaction causing severe rash involving mucus membranes or skin necrosis: No Has patient had a PCN reaction that required hospitalization No Has patient had a PCN reaction occurring within the last 10 years: No If all of the above answers are "NO", then may proceed with Cephalosporin use.   . Shrimp [Shellfish Allergy] Anaphylaxis    Per patient "shrimp only"  . Sulfa Antibiotics Nausea And Vomiting  . Azactam [Aztreonam]     Hand swelling   . Ciprofloxacin Other (See Comments)    dizziness  . Zantac [Ranitidine Hcl]   . Claritin-D 12 Hour [Loratadine-Pseudoephedrine Er] Anxiety   Review of Systems   Pertinent items are noted in the HPI. Otherwise, ROS is negative.  Vitals:   Vitals:   03/01/18 1517  BP: 112/74  Pulse: 85  Temp: 97.8 F (36.6 C)  TempSrc: Oral  SpO2: 94%  Weight: 165 lb (74.8 kg)  Height: 5\' 3"  (1.6 m)     Body mass index is 29.23 kg/m.  Physical Exam:   Physical Exam  Constitutional: She is oriented to person, place, and time. She appears well-developed and well-nourished. No distress.  HENT:  Head: Normocephalic and atraumatic.  Eyes: Pupils are equal, round, and reactive to light. Conjunctivae and EOM are normal.  Neck: Normal range of motion. Neck supple. No thyromegaly present.  Cardiovascular: Normal rate, regular rhythm and intact distal pulses.  Pulmonary/Chest: Effort normal and breath sounds normal.  Abdominal: Soft. Bowel sounds are normal.  Neurological: She is alert and oriented to person, place, and time.  Skin: Skin is warm.  Psychiatric: She has a normal mood and affect. Her behavior is normal.  Nursing note and vitals reviewed.   Results for orders placed or performed in visit on 03/01/18  Urine Culture  Result Value Ref Range   MICRO NUMBER: [id number]    SPECIMEN QUALITY:  ADEQUATE    Sample Source NOT GIVEN    STATUS: FINAL    Result: No Growth   CBC with Differential/Platelet  Result Value Ref Range   WBC 15.7 (H) 3.8 - 10.8 Thousand/uL   RBC 4.02 3.80 - 5.10 Million/uL   Hemoglobin 13.1 11.7 - 15.5 g/dL   HCT 38.6 35.0 - 45.0 %   MCV 96.0 80.0 - 100.0 fL   MCH 32.6 27.0 - 33.0 pg   MCHC 33.9 32.0 - 36.0 g/dL   RDW 13.7 11.0 - 15.0 %   Platelets 262 140 - 400 Thousand/uL   MPV 10.1 7.5 - 12.5 fL   Neutro Abs 13,675 (H) 1,500 - 7,800 cells/uL   Lymphs Abs 1,492 850 - 3,900 cells/uL   WBC mixed population 502 200 - 950 cells/uL   Eosinophils Absolute 16 15 -  500 cells/uL   Basophils Absolute 16 0 - 200 cells/uL   Neutrophils Relative % 87.1 %   Total Lymphocyte 9.5 %   Monocytes Relative 3.2 %   Eosinophils Relative 0.1 %   Basophils Relative 0.1 %  Comprehensive metabolic panel  Result Value Ref Range   Glucose, Bld 158 (H) 65 - 99 mg/dL   BUN 37 (H) 7 - 25 mg/dL   Creat 1.23 (H) 0.60 - 0.88 mg/dL   BUN/Creatinine Ratio 30 (H) 6 - 22 (calc)   Sodium 138 135 - 146 mmol/L   Potassium 4.5 3.5 - 5.3 mmol/L   Chloride 99 98 - 110 mmol/L   CO2 29 20 - 32 mmol/L   Calcium 9.6 8.6 - 10.4 mg/dL   Total Protein 6.9 6.1 - 8.1 g/dL   Albumin 4.0 3.6 - 5.1 g/dL   Globulin 2.9 1.9 - 3.7 g/dL (calc)   AG Ratio 1.4 1.0 - 2.5 (calc)   Total Bilirubin 0.7 0.2 - 1.2 mg/dL   Alkaline phosphatase (APISO) 42 33 - 130 U/L   AST 20 10 - 35 U/L   ALT 25 6 - 29 U/L  Brain natriuretic peptide  Result Value Ref Range   Brain Natriuretic Peptide 94 <100 pg/mL  Urinalysis, Routine w reflex microscopic  Result Value Ref Range   Color, Urine YELLOW YELLOW   APPearance CLEAR CLEAR   Specific Gravity, Urine 1.014 1.001 - 1.03   pH 7.0 5.0 - 8.0   Glucose, UA NEGATIVE NEGATIVE   Bilirubin Urine NEGATIVE NEGATIVE   Ketones, ur NEGATIVE NEGATIVE   Hgb urine dipstick TRACE (A) NEGATIVE   Protein, ur NEGATIVE NEGATIVE   Nitrite NEGATIVE NEGATIVE   Leukocytes, UA  1+ (A) NEGATIVE   WBC, UA 6-10 (A) 0 - 5 /HPF   RBC / HPF 0-2 0 - 2 /HPF   Squamous Epithelial / LPF NONE SEEN < OR = 5 /HPF   Bacteria, UA NONE SEEN NONE SEEN /HPF   Hyaline Cast NONE SEEN NONE SEEN /LPF   Assessment and Plan:   1. AKI (acute kidney injury) ([place]) Lasix has been decreased and methotrexate was held. Creatinine with some improvement. BNP up slightly.  - CBC with Differential/Platelet - Comprehensive metabolic panel - Brain natriuretic peptide - Urinalysis, Routine w reflex microscopic - Urine Culture  2. Screening for tuberculosis - PPD  3. Gastroesophageal reflux disease, esophagitis presence not specified Change medication to Protonix.  Patient states that she had an intolerance to Zantac.  We will need to add that to her intolerance list.  4. Bilateral lower extremity edema Worsened slightly with the decrease in Lasix.  She is monitoring her weight daily.  5. Rheumatoid arthritis involving multiple sites with positive rheumatoid factor ([place]) Followed by rheumatology.  Long-term use of systemic steroids.  She is now also followed by endocrinology to help to monitor the weaning.  Pain is increasing with weaning.  She does have follow-up with her rheumatologist.  6. Long term current use of systemic steroids, for RA As above.  7. Age-related osteoporosis without current pathological fracture Status post Reclast. Followed by rheumatology.   . Reviewed expectations re: course of current medical issues. . Discussed self-management of symptoms. . Outlined signs and symptoms indicating need for more acute intervention. . Patient verbalized understanding and all questions were answered. Marland Kitchen Health Maintenance issues including appropriate healthy diet, exercise, and smoking avoidance were discussed with patient. . See orders for this visit as documented in the electronic  medical record. . Patient received an After Visit Summary.  CMA served as Education administrator during this  visit. History, Physical, and Plan performed by medical provider. The above documentation has been reviewed and is accurate and complete. Briscoe Deutscher, D.O.  Briscoe Deutscher, DO [place], [facility] 03/04/2018

## 2018-03-02 LAB — COMPREHENSIVE METABOLIC PANEL
AG Ratio: 1.4 (calc) (ref 1.0–2.5)
ALT: 25 U/L (ref 6–29)
AST: 20 U/L (ref 10–35)
Albumin: 4 g/dL (ref 3.6–5.1)
Alkaline phosphatase (APISO): 42 U/L (ref 33–130)
BUN/Creatinine Ratio: 30 (calc) — ABNORMAL HIGH (ref 6–22)
BUN: 37 mg/dL — ABNORMAL HIGH (ref 7–25)
CO2: 29 mmol/L (ref 20–32)
Calcium: 9.6 mg/dL (ref 8.6–10.4)
Chloride: 99 mmol/L (ref 98–110)
Creat: 1.23 mg/dL — ABNORMAL HIGH (ref 0.60–0.88)
Globulin: 2.9 g/dL (calc) (ref 1.9–3.7)
Glucose, Bld: 158 mg/dL — ABNORMAL HIGH (ref 65–99)
Potassium: 4.5 mmol/L (ref 3.5–5.3)
Sodium: 138 mmol/L (ref 135–146)
Total Bilirubin: 0.7 mg/dL (ref 0.2–1.2)
Total Protein: 6.9 g/dL (ref 6.1–8.1)

## 2018-03-02 LAB — URINALYSIS, ROUTINE W REFLEX MICROSCOPIC
Bacteria, UA: NONE SEEN /HPF
Bilirubin Urine: NEGATIVE
Glucose, UA: NEGATIVE
Hyaline Cast: NONE SEEN /LPF
Ketones, ur: NEGATIVE
Nitrite: NEGATIVE
Protein, ur: NEGATIVE
Specific Gravity, Urine: 1.014 (ref 1.001–1.03)
Squamous Epithelial / LPF: NONE SEEN /HPF (ref <=5)
pH: 7 (ref 5.0–8.0)

## 2018-03-02 LAB — CBC WITH DIFFERENTIAL/PLATELET
Basophils Absolute: 16 cells/uL (ref 0–200)
Basophils Relative: 0.1 %
Eosinophils Absolute: 16 cells/uL (ref 15–500)
Eosinophils Relative: 0.1 %
HCT: 38.6 % (ref 35.0–45.0)
Hemoglobin: 13.1 g/dL (ref 11.7–15.5)
Lymphs Abs: 1492 cells/uL (ref 850–3900)
MCH: 32.6 pg (ref 27.0–33.0)
MCHC: 33.9 g/dL (ref 32.0–36.0)
MCV: 96 fL (ref 80.0–100.0)
MPV: 10.1 fL (ref 7.5–12.5)
Monocytes Relative: 3.2 %
Neutro Abs: 13675 cells/uL — ABNORMAL HIGH (ref 1500–7800)
Neutrophils Relative %: 87.1 %
Platelets: 262 10*3/uL (ref 140–400)
RBC: 4.02 10*6/uL (ref 3.80–5.10)
RDW: 13.7 % (ref 11.0–15.0)
Total Lymphocyte: 9.5 %
WBC mixed population: 502 cells/uL (ref 200–950)
WBC: 15.7 10*3/uL — ABNORMAL HIGH (ref 3.8–10.8)

## 2018-03-02 LAB — URINE CULTURE
MICRO NUMBER:: 90363233
Result:: NO GROWTH
SPECIMEN QUALITY:: ADEQUATE

## 2018-03-02 LAB — BRAIN NATRIURETIC PEPTIDE: Brain Natriuretic Peptide: 94 pg/mL (ref <100)

## 2018-03-04 ENCOUNTER — Encounter: Payer: Self-pay | Admitting: Family Medicine

## 2018-03-04 ENCOUNTER — Ambulatory Visit (INDEPENDENT_AMBULATORY_CARE_PROVIDER_SITE_OTHER): Payer: Medicare Other | Admitting: Family Medicine

## 2018-03-04 DIAGNOSIS — E538 Deficiency of other specified B group vitamins: Secondary | ICD-10-CM

## 2018-03-04 LAB — TB SKIN TEST: TB Skin Test: NEGATIVE

## 2018-03-04 MED ORDER — CYANOCOBALAMIN 1000 MCG/ML IJ SOLN
1000.0000 ug | Freq: Once | INTRAMUSCULAR | Status: AC
  Administered 2018-03-04: 1000 ug via INTRAMUSCULAR

## 2018-03-04 NOTE — Progress Notes (Signed)
Patient comes in today for B12 injection. Injection given in left deltoid. Patient tolerated well.

## 2018-03-04 NOTE — Progress Notes (Addendum)
Office Visit Note  Patient: Eileen Wilkerson             Date of Birth: 11/13/1932           MRN: [id number]             PCP: Briscoe Deutscher, DO Referring: Briscoe Deutscher, DO Visit Date: 03/14/2018 Occupation: @GUAROCC @    Subjective:  Follow-up (RIGHT FOOT PAIN 3-4 DAYS NO INJURY, KNEES HURT CONSTANT)   History of Present Illness: Eileen Wilkerson is a 84 y.o. female with history of rheumatoid arthritis and osteoarthritis.  Patient states that she was hospitalized in February with urosepsis and discharged but readmitted due to fever of unknown origin.  She was taken off the Plaquenil and methotrexate.  She has been off Plaquenil and methotrexate since January.  She was on prednisone 7 mg a day which she continued.  She was discharged from the hospital on high-dose prednisone.  She has been tapering her prednisone but had difficulty tapering.  She was seen by Dr. Dwyane Dee for possible adrenal insufficiency who is gradually tapering her prednisone now.  She has been having a lot of pain and discomfort in her bilateral knee joints.  She has some stiffness in her hands.  Patient states that she has had fracture in her right foot twice in the past.  She has been having pain and discomfort in her right foot now for the last 3-4 days.  Activities of Daily Living:  Patient reports morning stiffness for 1-2 hours.   Patient Reports nocturnal pain.  Difficulty dressing/grooming: Reports Difficulty climbing stairs: Reports Difficulty getting out of chair: Reports Difficulty using hands for taps, buttons, cutlery, and/or writing: Denies   Review of Systems  Constitutional: Positive for fatigue. Negative for fever, night sweats, weight gain and weight loss.  HENT: Negative for ear pain, mouth sores, trouble swallowing, trouble swallowing, mouth dryness and nose dryness.   Eyes: Negative for pain, redness, visual disturbance and dryness.  Respiratory: Negative for cough, shortness of breath and  difficulty breathing.   Cardiovascular: Negative for chest pain, palpitations, hypertension, irregular heartbeat and swelling in legs/feet.  Gastrointestinal: Negative for blood in stool, constipation and diarrhea.  Endocrine: Negative for increased urination.  Genitourinary: Negative for difficulty urinating and vaginal dryness.  Musculoskeletal: Positive for arthralgias, joint pain, myalgias, muscle weakness and myalgias. Negative for joint swelling, morning stiffness and muscle tenderness.  Skin: Negative for color change, rash, hair loss, skin tightness, ulcers and sensitivity to sunlight.  Allergic/Immunologic: Negative for susceptible to infections.  Neurological: Negative for dizziness, numbness, headaches, memory loss, night sweats and weakness.  Hematological: Positive for bruising/bleeding tendency. Negative for swollen glands.  Psychiatric/Behavioral: Positive for sleep disturbance. Negative for depressed mood. The patient is not nervous/anxious.     PMFS History:  Patient Active Problem List   Diagnosis Date Noted  . B12 deficiency 03/11/2018  . AKI (acute kidney injury) ([place]) 02/14/2018  . Idiopathic chronic venous hypertension of both lower extremities with ulcer and inflammation ([place]) 09/18/2017  . Bilateral lower extremity edema 06/30/2017  . Physical deconditioning 05/30/2017  . Immunosuppressed status ([place]) 05/30/2017  . Abnormal chest x-ray 05/17/2017  . Hypokalemia 04/25/2017  . Fibromyalgia 12/18/2016  . DJD (degenerative joint disease), cervical 12/18/2016  . Spondylosis of lumbar region without myelopathy or radiculopathy 12/18/2016  . Trigger finger, right middle finger 12/18/2016  . Primary osteoarthritis of both feet 12/18/2016  . Primary osteoarthritis of both knees 12/18/2016  . GERD (gastroesophageal reflux disease) 12/18/2016  .  Age-related osteoporosis without current pathological fracture 12/18/2016  . Rheumatoid arthritis ([place]) 12/28/2015  . Long term  current use of systemic steroids, for RA 12/28/2015  . Lymphocytosis 11/13/2012    Past Medical History:  Diagnosis Date  . Adrenal failure ([place])   . Arthritis   . Cataract   . Closed nondisplaced fracture of fifth right metatarsal bone 09/18/2017  . Fibromyalgia 2008  . HCAP (healthcare-associated pneumonia) 02/03/2018  . Osteoporosis   . RA (rheumatoid arthritis) ([place])   . Sepsis due to urinary tract infection ([place]) 01/18/2018    Family History  Problem Relation Age of Onset  . Breast cancer Mother   . Diabetes Father    Past Surgical History:  Procedure Laterality Date  . BACK SURGERY    . BILATERAL CARPAL TUNNEL RELEASE    . CATARACT EXTRACTION, BILATERAL  2004  . CERVICAL FUSION  2011,2010,2008  . HEEL SPUR SURGERY  2004  . ROTATOR CUFF REPAIR    . TONSILLECTOMY AND ADENOIDECTOMY  1947  . TOTAL SHOULDER ARTHROPLASTY     Social History   Social History Narrative  . Not on file     Objective: Vital Signs: BP 126/74 (BP Location: Left Arm, Patient Position: Sitting, Cuff Size: Normal)   Pulse 70   Resp 16   Ht 5\' 3"  (1.6 m)   Wt 166 lb (75.3 kg)   BMI 29.41 kg/m    Physical Exam  Constitutional: She is oriented to person, place, and time. She appears well-developed and well-nourished.  HENT:  Head: Normocephalic and atraumatic.  Eyes: Conjunctivae and EOM are normal.  Neck: Normal range of motion.  Cardiovascular: Normal rate, regular rhythm, normal heart sounds and intact distal pulses.  Pulmonary/Chest: Effort normal and breath sounds normal.  Abdominal: Soft. Bowel sounds are normal.  Lymphadenopathy:    She has no cervical adenopathy.  Neurological: She is alert and oriented to person, place, and time.  Skin: Skin is warm and dry. Capillary refill takes less than 2 seconds.  Psychiatric: She has a normal mood and affect. Her behavior is normal.  Nursing note and vitals reviewed.    Musculoskeletal Exam: C-spine thoracic lumbar spine limited range of  motion.  Shoulder joints elbow joints wrist joints are good range of motion.  She has DIP PIP thickening but no active synovitis in her MCPs.  She has painful range of motion of bilateral knee joints without any warmth.  She has tenderness over the lateral aspect of right foot over the base of fifth metatarsal.  CDAI Exam: CDAI Homunculus Exam:   Tenderness:  RLE: tibiofemoral LLE: tibiofemoral  Joint Counts:  CDAI Tender Joint count: 2 CDAI Swollen Joint count: 0  Global Assessments:  Patient Global Assessment: 5 Provider Global Assessment: 5  CDAI Calculated Score: 12    Investigation: No additional findings. CBC Latest Ref Rng & Units 03/01/2018 02/13/2018 02/06/2018  WBC 3.8 - 10.8 Thousand/uL 15.7(H) 19.0 Repeated and verified X2.([place]) 10.6(H)  Hemoglobin 11.7 - 15.5 g/dL 13.1 13.4 11.5(L)  Hematocrit 35.0 - 45.0 % 38.6 41.2 35.0(L)  Platelets 140 - 400 Thousand/uL 262 307.0 276   CMP Latest Ref Rng & Units 03/01/2018 02/21/2018 02/13/2018  Glucose 65 - 99 mg/dL 158(H) 125(H) 95  BUN 7 - 25 mg/dL 37(H) 42(H) 37(H)  Creatinine 0.60 - 0.88 mg/dL 1.23(H) 1.45(H) 1.26(H)  Sodium 135 - 146 mmol/L 138 137 138  Potassium 3.5 - 5.3 mmol/L 4.5 4.8 4.9  Chloride 98 - 110 mmol/L 99 98 99  CO2 20 - 32 mmol/L 29 33(H) 30  Calcium 8.6 - 10.4 mg/dL 9.6 10.7(H) 11.7(H)  Total Protein 6.1 - 8.1 g/dL 6.9 - 7.8  Total Bilirubin 0.2 - 1.2 mg/dL 0.7 - 0.7  Alkaline Phos 39 - 117 U/L - - 42  AST 10 - 35 U/L 20 - 20  ALT 6 - 29 U/L 25 - 25    Imaging: Dg Chest 2 View  Result Date: 02/13/2018 CLINICAL DATA:  Shortness of breath, cough. EXAM: CHEST - 2 VIEW COMPARISON:  Radiograph of February 02, 2018. FINDINGS: The heart size and mediastinal contours are within normal limits. Both lungs are clear. No pneumothorax or pleural effusion is noted. The visualized skeletal structures are unremarkable. IMPRESSION: No active cardiopulmonary disease. Electronically Signed   By: Marijo Conception, M.D.   On:  02/13/2018 15:48   Xr Foot Complete Right  Result Date: 03/14/2018 No MTP narrowing was noted.  No PIP DIP narrowing was noted.  There is possible old callus and the second metatarsal.  At the base of the fifth metatarsal there is some irregularity in the cortex which could be a chip fracture.   Speciality Comments: PLQ eye exam: 10/18/2017 Normal. Dr. Ricki Miller. Follow up in 6 months.    Procedures:  No procedures performed Allergies: Adhesive [tape]; Celebrex [celecoxib]; Ciprofibrate; Cymbalta [duloxetine hcl]; Gabitril [tiagabine]; Keflex [cephalexin]; Lyrica [pregabalin]; Neurontin [gabapentin]; Nexium [esomeprazole]; Nsaids; Penicillins; Shrimp [shellfish allergy]; Sulfa antibiotics; Azactam [aztreonam]; Ciprofloxacin; Zantac [ranitidine hcl]; and Claritin-d 12 hour [loratadine-pseudoephedrine er]   Assessment / Plan:     Visit Diagnoses: Rheumatoid arthritis involving multiple sites with positive rheumatoid factor [facility]): Patient has no synovitis on examination today.  She is on high-dose prednisone still for adrenal insufficiency.  She developed sepsis while she was on Plaquenil and methotrexate.  She is very hesitant to go on methotrexate.  I will restart Plaquenil today.  Due to low GFR we will give her Plaquenil 200 mg p.o. daily .  Patient has Plaquenil prescription at home which she will restart.  She has been advised to get labs every 3 months to monitor for drug toxicity.   High risk medication use - PLQ, MTX,-are on hold currently.  Reclast-was given by her PCP in the past.  Prednisone 10 mgAM and 5 mg PM  Primary osteoarthritis of both hands she does have osteoarthritis in her hands which causes discomfort.  Primary osteoarthritis of both knees: She has severe osteoarthritis in her bilateral knee joints which causes discomfort with mobility.  Patient is requesting Visco supplement injections.  We for Euflexxa to bilateral knee joints.  Pain in right foot -she gives history of  right foot fracture x2 in the past.  She is concerned that she might have fractured again.  Plan: XR Foot Complete Right there is possible chip fracture of the base of fifth metatarsal.  I offered a shoe.  Patient states she has a shoe at home which she can use.  I have also advised her to schedule an appointment with Dr. Durward Fortes.  Primary osteoarthritis of both feet: Chronic pain and discomfort.  Age-related osteoporosis without current pathological fracture -  Her DEXA is followed up by her PCP and treated by PCP with Reclast.  Fibromyalgia and she continues to have generalized pain and discomfort. Long term current use of systemic steroids, for RA - Due to adrenal insufficiency  DDD (degenerative disc disease), cervical: She has limited range of motion.   History of fatigue - due to deconditioning.  Adrenal insufficiency ([place])    Orders: Orders Placed This Encounter  Procedures  . XR Foot Complete Right   No orders of the defined types were placed in this encounter.   Face-to-face time spent with patient was 30 minutes.  Greater than 50% of time was spent in counseling and coordination of care.  Follow-Up Instructions: Return in about 5 months (around 08/14/2018) for Rheumatoid arthritis, Osteoarthritis, Osteoporosis.   Bo Merino, MD  Note - This record has been created using Editor, commissioning.  Chart creation errors have been sought, but may not always  have been located. Such creation errors do not reflect on  the standard of medical care.

## 2018-03-05 ENCOUNTER — Telehealth: Payer: Self-pay | Admitting: Family Medicine

## 2018-03-05 NOTE — Telephone Encounter (Signed)
Copied from [place] [id number]. Topic: Quick Communication - See Telephone Encounter >> Feb 28, 2018 10:17 AM Boyd Kerbs wrote: CRM for notification. See Telephone encounter for: 02/28/18.  [place] - [phone]  - resumed services on 2/28 after hospitalization,  the next week pt. refused service and refused visit the following week - week of 11th.  Upset with service - she says no one out since 28th, and how long appt on 28th took.  Not happy with time they could have visit and hung up them.  Maybe someone at office can rectify this,  they take ownership on [facility] end on the time  Asking if should discharge her or if doctor wants to speak to pt. First.  >> Mar 05, 2018  3:04 PM Vernona Rieger wrote: Sherron Flemings is calling for an update [phone]

## 2018-03-06 ENCOUNTER — Other Ambulatory Visit: Payer: Self-pay | Admitting: Surgical

## 2018-03-06 DIAGNOSIS — R2689 Other abnormalities of gait and mobility: Secondary | ICD-10-CM

## 2018-03-06 NOTE — Telephone Encounter (Signed)
DC [facility] and refer to another service if patient wants - though I think that she was past the time of need.

## 2018-03-06 NOTE — Telephone Encounter (Signed)
Please advise I now you talked to patient about this at her last office visit.

## 2018-03-08 ENCOUNTER — Encounter: Payer: Self-pay | Admitting: Rheumatology

## 2018-03-08 NOTE — Telephone Encounter (Signed)
Patient stated that she told the facility that they was fired. She does not want to continue with another service either. Notified Elizabeth at [facility] that Ms. Middlebrooks wanted to DC service.

## 2018-03-11 ENCOUNTER — Ambulatory Visit (INDEPENDENT_AMBULATORY_CARE_PROVIDER_SITE_OTHER): Payer: Medicare Other

## 2018-03-11 DIAGNOSIS — E538 Deficiency of other specified B group vitamins: Secondary | ICD-10-CM

## 2018-03-11 MED ORDER — CYANOCOBALAMIN 1000 MCG/ML IJ SOLN
1000.0000 ug | Freq: Once | INTRAMUSCULAR | Status: AC
  Administered 2018-03-11: 1000 ug via INTRAMUSCULAR

## 2018-03-11 NOTE — Progress Notes (Signed)
Patient comes in today for B12 injection.  Given in right deltoid.  Patient tolerated without difficulty.  Scheduled appointment for another B12 injection in 1 week.

## 2018-03-13 ENCOUNTER — Encounter: Payer: Self-pay | Admitting: Endocrinology

## 2018-03-13 ENCOUNTER — Other Ambulatory Visit: Payer: Self-pay | Admitting: Rheumatology

## 2018-03-14 ENCOUNTER — Ambulatory Visit (INDEPENDENT_AMBULATORY_CARE_PROVIDER_SITE_OTHER): Payer: Self-pay

## 2018-03-14 ENCOUNTER — Ambulatory Visit: Payer: Medicare Other | Admitting: Rheumatology

## 2018-03-14 ENCOUNTER — Encounter: Payer: Self-pay | Admitting: Rheumatology

## 2018-03-14 VITALS — BP 126/74 | HR 70 | Resp 16 | Ht 63.0 in | Wt 166.0 lb

## 2018-03-14 DIAGNOSIS — M5030 Other cervical disc degeneration, unspecified cervical region: Secondary | ICD-10-CM

## 2018-03-14 DIAGNOSIS — M19072 Primary osteoarthritis, left ankle and foot: Secondary | ICD-10-CM

## 2018-03-14 DIAGNOSIS — Z87898 Personal history of other specified conditions: Secondary | ICD-10-CM | POA: Diagnosis not present

## 2018-03-14 DIAGNOSIS — M810 Age-related osteoporosis without current pathological fracture: Secondary | ICD-10-CM | POA: Diagnosis not present

## 2018-03-14 DIAGNOSIS — M19042 Primary osteoarthritis, left hand: Secondary | ICD-10-CM

## 2018-03-14 DIAGNOSIS — M19071 Primary osteoarthritis, right ankle and foot: Secondary | ICD-10-CM | POA: Diagnosis not present

## 2018-03-14 DIAGNOSIS — Z7952 Long term (current) use of systemic steroids: Secondary | ICD-10-CM

## 2018-03-14 DIAGNOSIS — M797 Fibromyalgia: Secondary | ICD-10-CM

## 2018-03-14 DIAGNOSIS — Z8679 Personal history of other diseases of the circulatory system: Secondary | ICD-10-CM

## 2018-03-14 DIAGNOSIS — M170 Bilateral primary osteoarthritis of knee: Secondary | ICD-10-CM | POA: Diagnosis not present

## 2018-03-14 DIAGNOSIS — M19041 Primary osteoarthritis, right hand: Secondary | ICD-10-CM

## 2018-03-14 DIAGNOSIS — M79671 Pain in right foot: Secondary | ICD-10-CM | POA: Diagnosis not present

## 2018-03-14 DIAGNOSIS — M0579 Rheumatoid arthritis with rheumatoid factor of multiple sites without organ or systems involvement: Secondary | ICD-10-CM

## 2018-03-14 DIAGNOSIS — Z79899 Other long term (current) drug therapy: Secondary | ICD-10-CM

## 2018-03-14 DIAGNOSIS — E2740 Unspecified adrenocortical insufficiency: Secondary | ICD-10-CM

## 2018-03-14 NOTE — Patient Instructions (Signed)
Standing Labs We placed an order today for your standing lab work.    Please come back and get your standing labs in 3 months and then every 3 months  We have open lab Monday through Friday from 8:30-11:30 AM and 1:30-4:00 PM  at the office of Dr. Quamel Fitzmaurice.   You may experience shorter wait times on Monday and Friday afternoons. The office is located at [address], [address], [place],  [address] No appointment is necessary.   Labs are drawn by [facility].  You may receive a bill from [facility] for your lab work. If you have any questions regarding directions or hours of operation,  please call [phone].    

## 2018-03-14 NOTE — Telephone Encounter (Signed)
Last Visit: 02/14/18 Next Visit: 03/14/18  Okay to refill per Dr. Estanislado Pandy

## 2018-03-15 ENCOUNTER — Encounter: Payer: Self-pay | Admitting: Endocrinology

## 2018-03-15 ENCOUNTER — Telehealth: Payer: Self-pay

## 2018-03-15 ENCOUNTER — Encounter: Payer: Self-pay | Admitting: Family Medicine

## 2018-03-15 NOTE — Telephone Encounter (Signed)
Received a fax from [employer] regarding a prior authorization approval for DICLOFENAC GEL 1% through 12/10/2018.   Reference number: [id number] Phone number: [phone]  Will send document to scan center.  Called pt to update. Patient voices understanding and denies any questions at this time.  Pavel Gadd, [place], CPhT 9:48 AM

## 2018-03-15 NOTE — Telephone Encounter (Signed)
Received a prior authorization for Diclofenac gel 3% from [facility]. Authorization has been submitted to pts insruance via cover my meds.   Will update once we have a response.   Eliza Green, [place], CPhT 8:56 AM

## 2018-03-18 ENCOUNTER — Ambulatory Visit (INDEPENDENT_AMBULATORY_CARE_PROVIDER_SITE_OTHER): Payer: Medicare Other

## 2018-03-18 ENCOUNTER — Other Ambulatory Visit: Payer: Self-pay | Admitting: Endocrinology

## 2018-03-18 DIAGNOSIS — E538 Deficiency of other specified B group vitamins: Secondary | ICD-10-CM | POA: Diagnosis not present

## 2018-03-18 MED ORDER — PREDNISONE 1 MG PO TABS: 1.0000 mg | ORAL_TABLET | Freq: Every day | ORAL | 2 refills | Status: DC

## 2018-03-18 MED ORDER — CYANOCOBALAMIN 1000 MCG/ML IJ SOLN
1000.0000 ug | Freq: Once | INTRAMUSCULAR | Status: AC
  Administered 2018-03-18: 1000 ug via INTRAMUSCULAR

## 2018-03-18 MED ORDER — PREDNISONE 5 MG PO TABS: ORAL_TABLET | ORAL | 3 refills | Status: DC

## 2018-03-18 NOTE — Progress Notes (Signed)
Patient received vitamin B12 1000 mcg in left deltoid.  Tolerated without difficulty.  Will schedule B12 injection in 1 month.

## 2018-03-19 ENCOUNTER — Encounter (INDEPENDENT_AMBULATORY_CARE_PROVIDER_SITE_OTHER): Payer: Self-pay | Admitting: Orthopaedic Surgery

## 2018-03-19 ENCOUNTER — Ambulatory Visit (INDEPENDENT_AMBULATORY_CARE_PROVIDER_SITE_OTHER): Payer: Medicare Other | Admitting: Rheumatology

## 2018-03-19 ENCOUNTER — Ambulatory Visit (INDEPENDENT_AMBULATORY_CARE_PROVIDER_SITE_OTHER): Payer: Medicare Other | Admitting: Orthopaedic Surgery

## 2018-03-19 VITALS — BP 133/72 | HR 68 | Resp 16 | Ht 63.25 in | Wt 165.0 lb

## 2018-03-19 DIAGNOSIS — M1711 Unilateral primary osteoarthritis, right knee: Secondary | ICD-10-CM

## 2018-03-19 DIAGNOSIS — M1712 Unilateral primary osteoarthritis, left knee: Secondary | ICD-10-CM | POA: Diagnosis not present

## 2018-03-19 DIAGNOSIS — M79671 Pain in right foot: Secondary | ICD-10-CM

## 2018-03-19 DIAGNOSIS — M170 Bilateral primary osteoarthritis of knee: Secondary | ICD-10-CM

## 2018-03-19 MED ORDER — SODIUM HYALURONATE (VISCOSUP) 20 MG/2ML IX SOSY
20.0000 mg | PREFILLED_SYRINGE | INTRA_ARTICULAR | Status: AC | PRN
  Administered 2018-03-19: 20 mg via INTRA_ARTICULAR

## 2018-03-19 MED ORDER — LIDOCAINE HCL 1 % IJ SOLN
1.5000 mL | INTRAMUSCULAR | Status: AC | PRN
  Administered 2018-03-19: 1.5 mL

## 2018-03-19 NOTE — Progress Notes (Signed)
   Procedure Note  Patient: DANASHA MELMAN             Date of Birth: [birth year]           MRN: [id number]             Visit Date: 03/19/2018  Procedures: Visit Diagnoses: Primary osteoarthritis of both knees Euflexxa #1 BIL P/P  Large Joint Inj: bilateral knee on 03/19/2018 11:50 AM Indications: pain Details: 27 G 1.5 in needle, medial approach  Arthrogram: No  Medications (Right): 20 mg Sodium Hyaluronate 20 MG/2ML; 1.5 mL lidocaine 1 % Aspirate (Right): 0 mL Medications (Left): 20 mg Sodium Hyaluronate 20 MG/2ML; 1.5 mL lidocaine 1 % Aspirate (Left): 0 mL Outcome: tolerated well, no immediate complications Procedure, treatment alternatives, risks and benefits explained, specific risks discussed. Consent was given by the patient. Immediately prior to procedure a time out was called to verify the correct patient, procedure, equipment, support staff and site/side marked as required. Patient was prepped and draped in the usual sterile fashion.    Bo Merino, MD

## 2018-03-19 NOTE — Progress Notes (Signed)
Office Visit Note   Patient: Eileen Wilkerson           Date of Birth: 02-Aug-1932           MRN: [id number] Visit Date: 03/19/2018              Requested by: Briscoe Deutscher, [address], [place] [address] PCP: Briscoe Deutscher, DO   Assessment & Plan: Visit Diagnoses:  1. Right foot pain     Plan: Probable stress fracture second or third metatarsal right foot.  Comfortable in wooden shoe.  Continue with shoe 3-4 weeks and if no improvement return for repeat films Follow-Up Instructions: Return if symptoms worsen or fail to improve.   Orders:  No orders of the defined types were placed in this encounter.  No orders of the defined types were placed in this encounter.     Procedures: No procedures performed   Clinical Data: No additional findings.   Subjective: Chief Complaint  Patient presents with  . Right Foot - Pain  . New Patient (Initial Visit)    RIGHT FOOT PAIN GETTING WORS, PT STATES 3RD TIME BEEN BROKEN  Mrs. Genrich relates recent insidious onset of right foot pain after trying to catch her husband from falling.  She is having some pain in the area of the dorsum of her right foot without any obvious visual changes.  It is hard for her to localize her pain but it seems to be along the base of the fifth metatarsal and in the middle of the forefoot.  She has had a prior history of stress fracture of her foot.  I reviewed her old films and it appears that she has had a fracture at the base of the fifth metatarsal and a stress fracture of the distal portion of the second metatarsal.. I reviewed films on the PACS system from Dr. Estanislado Pandy from her evaluation last week and Dr. Jess Barters evaluation in November.  There does not appear to be any significant change in the appearance of the fractures.  No evidence of a new fracture.  HPI  Review of Systems  Constitutional: Positive for fatigue.  HENT: Negative for ear pain.   Eyes: Negative for pain.    Respiratory: Negative for cough and shortness of breath.   Cardiovascular: Negative for leg swelling.  Gastrointestinal: Negative for constipation and diarrhea.  Genitourinary: Negative for difficulty urinating.  Musculoskeletal: Positive for back pain and neck pain.  Skin: Negative for rash.  Allergic/Immunologic: Positive for food allergies.  Neurological: Positive for weakness. Negative for numbness.  Hematological: Bruises/bleeds easily.  Psychiatric/Behavioral: Negative for sleep disturbance.     Objective: Vital Signs: BP 133/72 (BP Location: Left Arm, Patient Position: Sitting, Cuff Size: Normal)   Pulse 68   Resp 16   Ht 5' 3.25" (1.607 m)   Wt 165 lb (74.8 kg)   BMI 29.00 kg/m   Physical Exam  Ortho Exam awake alert and oriented x3.  Comfortable sitting.  Uses a cane to aid in ambulate patient.  Right foot exam without ecchymosis or swelling.  Some mild pain at the base of the fifth metatarsal without deformity.  Some mild pain in the middle of the forefoot dorsally without any skin changes.  No pain in the heel ankle or plantar aspect of the foot  Specialty Comments:  No specialty comments available.  Imaging: No results found.   PMFS History: Patient Active Problem List   Diagnosis Date Noted  . B12  deficiency 03/11/2018  . AKI (acute kidney injury) ([place]) 02/14/2018  . Idiopathic chronic venous hypertension of both lower extremities with ulcer and inflammation ([place]) 09/18/2017  . Bilateral lower extremity edema 06/30/2017  . Physical deconditioning 05/30/2017  . Immunosuppressed status ([place]) 05/30/2017  . Abnormal chest x-ray 05/17/2017  . Hypokalemia 04/25/2017  . Fibromyalgia 12/18/2016  . DJD (degenerative joint disease), cervical 12/18/2016  . Spondylosis of lumbar region without myelopathy or radiculopathy 12/18/2016  . Trigger finger, right middle finger 12/18/2016  . Primary osteoarthritis of both feet 12/18/2016  . Primary osteoarthritis of both  knees 12/18/2016  . GERD (gastroesophageal reflux disease) 12/18/2016  . Age-related osteoporosis without current pathological fracture 12/18/2016  . Rheumatoid arthritis ([place]) 12/28/2015  . Long term current use of systemic steroids, for RA 12/28/2015  . Lymphocytosis 11/13/2012   Past Medical History:  Diagnosis Date  . Adrenal failure ([place])   . Arthritis   . Cataract   . Closed nondisplaced fracture of fifth right metatarsal bone 09/18/2017  . Fibromyalgia 2008  . HCAP (healthcare-associated pneumonia) 02/03/2018  . Osteoporosis   . RA (rheumatoid arthritis) ([place])   . Sepsis due to urinary tract infection ([place]) 01/18/2018    Family History  Problem Relation Age of Onset  . Breast cancer Mother   . Diabetes Father     Past Surgical History:  Procedure Laterality Date  . BACK SURGERY    . BILATERAL CARPAL TUNNEL RELEASE    . CATARACT EXTRACTION, BILATERAL  2004  . CERVICAL FUSION  2011,2010,2008  . HEEL SPUR SURGERY  2004  . ROTATOR CUFF REPAIR    . TONSILLECTOMY AND ADENOIDECTOMY  1947  . TOTAL SHOULDER ARTHROPLASTY     Social History   Occupational History  . Not on file  Tobacco Use  . Smoking status: Former Smoker    Packs/day: 0.75    Years: 10.00    Pack years: 7.50    Types: Cigarettes    Last attempt to quit: 1968    Years since quitting: 51.3  . Smokeless tobacco: Never Used  Substance and Sexual Activity  . Alcohol use: No  . Drug use: No  . Sexual activity: Not on file

## 2018-03-21 ENCOUNTER — Encounter: Payer: Self-pay | Admitting: Rheumatology

## 2018-03-22 ENCOUNTER — Encounter: Payer: Self-pay | Admitting: Endocrinology

## 2018-03-22 ENCOUNTER — Ambulatory Visit: Payer: Medicare Other | Admitting: Endocrinology

## 2018-03-22 VITALS — BP 120/82 | HR 79 | Ht 63.25 in | Wt 165.0 lb

## 2018-03-22 DIAGNOSIS — E2740 Unspecified adrenocortical insufficiency: Secondary | ICD-10-CM

## 2018-03-22 DIAGNOSIS — T380X5A Adverse effect of glucocorticoids and synthetic analogues, initial encounter: Secondary | ICD-10-CM | POA: Diagnosis not present

## 2018-03-22 NOTE — Progress Notes (Signed)
Patient ID: Eileen Wilkerson, female   DOB: 1932-04-12, 84 y.o.   MRN: [id number]          Referring physician: Briscoe Deutscher, S. Deveshwar  Chief complaint: Adjust steroid dosage  History of Present Illness:   Several years ago the patient was having recurrent steroid injections for her back and was told that she had persistent adrenal suppression but no details are available She was told that her cortisol levels were low and she was started on prednisone and kept on a maintenance dose of 7 mg daily Subsequently she developed rheumatoid arthritis and was told to continue prednisone for this reason also  She had been followed periodically by an endocrinologist until about a year and a half ago for monitoring her prednisone doses  Generally she had been feeling fairly good with no lightheadedness, weakness, nausea or change in appetite  However she did have problems with depression and decreased appetite with weight loss last fall after her husband's death  More recently she had 2 admissions to the hospital in February, once for UTI and another one for unknown febrile illness with GI symptoms Although she was not hypotensive at the admission time he had low normal blood pressure readings and was given higher doses of steroids, which doing and on discharge from the hospital on 02/06/18  Although she was discharged on 80 mg of prednisone this caused severe insomnia and this was reduced in a few days down to 50 mg On her initial consultation in 3/19 she was taking 20 mg twice daily  She was given a tapering schedule on reducing her prednisone in March with rapid tapering schedule  With this she did well the middle of last week when she started feeling some form of malaise and slight weakness but no nausea or lightheadedness.  At this time she was on a total of 12 mg a day using 10 mg in the morning  She continued the same dose and did not reduce the dose and has felt better since then No  unusual fatigue in the last few days or nausea   Wt Readings from Last 3 Encounters:  03/22/18 165 lb (74.8 kg)  03/19/18 165 lb (74.8 kg)  03/14/18 166 lb (75.3 kg)    Past Medical History:  Diagnosis Date  . Adrenal failure ([place])   . Arthritis   . Cataract   . Closed nondisplaced fracture of fifth right metatarsal bone 09/18/2017  . Fibromyalgia 2008  . HCAP (healthcare-associated pneumonia) 02/03/2018  . Osteoporosis   . RA (rheumatoid arthritis) ([facility])   . Sepsis due to urinary tract infection ([place]) 01/18/2018    Past Surgical History:  Procedure Laterality Date  . BACK SURGERY    . BILATERAL CARPAL TUNNEL RELEASE    . CATARACT EXTRACTION, BILATERAL  2004  . CERVICAL FUSION  2011,2010,2008  . HEEL SPUR SURGERY  2004  . ROTATOR CUFF REPAIR    . TONSILLECTOMY AND ADENOIDECTOMY  1947  . TOTAL SHOULDER ARTHROPLASTY      Family History  Problem Relation Age of Onset  . Breast cancer Mother   . Diabetes Father     Social History:  reports that she quit smoking about 51 years ago. Her smoking use included cigarettes. She has a 7.50 pack-year smoking history. She has never used smokeless tobacco. She reports that she does not drink alcohol or use drugs.  Allergies:  Allergies  Allergen Reactions  . Adhesive [Tape] Rash  . Celebrex [Celecoxib] Hives  .  Ciprofibrate Nausea Only  . Cymbalta [Duloxetine Hcl] Swelling  . Gabitril [Tiagabine] Swelling  . Keflex [Cephalexin] Nausea And Vomiting  . Lyrica [Pregabalin] Swelling  . Neurontin [Gabapentin] Swelling  . Nexium [Esomeprazole] Rash  . Nsaids Rash  . Penicillins Rash    Injection site reaction. Tolerated cefepime in past Has patient had a PCN reaction causing immediate rash, facial/tongue/throat swelling, SOB or lightheadedness with hypotension: No Has patient had a PCN reaction causing severe rash involving mucus membranes or skin necrosis: No Has patient had a PCN reaction that required hospitalization No Has  patient had a PCN reaction occurring within the last 10 years: No If all of the above answers are "NO", then may proceed with Cephalosporin use.   . Shrimp [Shellfish Allergy] Anaphylaxis    Per patient "shrimp only"  . Sulfa Antibiotics Nausea And Vomiting  . Azactam [Aztreonam]     Hand swelling   . Ciprofloxacin Other (See Comments)    dizziness  . Zantac [Ranitidine Hcl]   . Claritin-D 12 Hour [Loratadine-Pseudoephedrine Er] Anxiety    Allergies as of 03/22/2018      Reactions   Adhesive [tape] Rash   Celebrex [celecoxib] Hives   Ciprofibrate Nausea Only   Cymbalta [duloxetine Hcl] Swelling   Gabitril [tiagabine] Swelling   Keflex [cephalexin] Nausea And Vomiting   Lyrica [pregabalin] Swelling   Neurontin [gabapentin] Swelling   Nexium [esomeprazole] Rash   Nsaids Rash   Penicillins Rash   Injection site reaction. Tolerated cefepime in past Has patient had a PCN reaction causing immediate rash, facial/tongue/throat swelling, SOB or lightheadedness with hypotension: No Has patient had a PCN reaction causing severe rash involving mucus membranes or skin necrosis: No Has patient had a PCN reaction that required hospitalization No Has patient had a PCN reaction occurring within the last 10 years: No If all of the above answers are "NO", then may proceed with Cephalosporin use.   Shrimp [shellfish Allergy] Anaphylaxis   Per patient "shrimp only"   Sulfa Antibiotics Nausea And Vomiting   Azactam [aztreonam]    Hand swelling    Ciprofloxacin Other (See Comments)   dizziness   Zantac [ranitidine Hcl]    Claritin-d 12 Hour [loratadine-pseudoephedrine Er] Anxiety      Medication List        Accurate as of 03/22/18 11:59 PM. Always use your most recent med list.          acetaminophen 500 MG tablet Commonly known as:  TYLENOL Take 500 mg by mouth every 6 (six) hours as needed.   atorvastatin 20 MG tablet Commonly known as:  LIPITOR Take 1 tablet (20 mg total) by mouth  at bedtime.   BIOTIN MAXIMUM STRENGTH 10 MG Tabs Generic drug:  Biotin Take 10 mg by mouth daily.   diclofenac sodium 1 % Gel Commonly known as:  VOLTAREN APPLY 3 GRAMS TO 3 LARGEST JOINTS THREE TIMES DAILY AS NEEDED   EUFLEXXA 20 MG/2ML Sosy Generic drug:  Sodium Hyaluronate   fluticasone 50 MCG/ACT nasal spray Commonly known as:  FLONASE Place 2 sprays into both nostrils daily.   LASIX 40 MG tablet Generic drug:  furosemide Take 40 mg by mouth daily.   pantoprazole 40 MG tablet Commonly known as:  PROTONIX Take 1 tablet (40 mg total) by mouth daily.   potassium chloride 10 MEQ tablet Commonly known as:  K-DUR Take 1 tablet (10 mEq total) by mouth 2 (two) times daily.   predniSONE 5 MG tablet Commonly known as:  DELTASONE 1 tablet at breakfast and taper as instructed   predniSONE 1 MG tablet Commonly known as:  DELTASONE Take 1 tablet (1 mg total) by mouth daily with breakfast. Take as directed on tapering dose   traMADol 50 MG tablet Commonly known as:  ULTRAM TAKE 1 TABLET BY MOUTH THREE DAILY AS NEEDED PAIN   Vitamin D3 5000 units Caps Take 5,000 Units by mouth daily.       LABS:   Lab Results  Component Value Date   VD25OH 51.95 02/21/2018         Review of Systems   On vitamin D description, taking 50,000 units weekly, followed by rheumatologist   PHYSICAL EXAM:  BP 120/82 (BP Location: Left Arm, Patient Position: Sitting, Cuff Size: Normal)   Pulse 79   Ht 5' 3.25" (1.607 m)   Wt 165 lb (74.8 kg)   SpO2 96%   BMI 29.00 kg/m     ASSESSMENT:    Long-term use of corticosteroids with history of adrenal suppression.    Currently taking 12 mg a day and she was doing well until last week with her rapid tapering schedule Baseline dose on discharge was 80 mg prior to her last months consultation  She is doing well today and has no orthostatic hypotension Labs last month were normal  HYPERCALCEMIA: She had a transient increase in  calcium with normal PTH and this was normal back on 03/01/18 and may have been a false increase in calcium    PLAN:   Tapering dose of prednisone to be restarted with the following schedule:  April 13-April 20 =8 mg in the morning and 2 mg in the afternoon  April 20-27: Take 6 mg in the morning and 2 mg in the afternoon  Then she will be on 5 am and 2 in pm which may be her long-term dose  She will let us know if she is feeling weak/lightheaded, losing appetite or feeling nauseated  She will follow-up in about 4 weeks and repeat electrolytes at that time  Also reassured her that she does not need to take 2 calcium tablets a day since her last level was 4.5, but she will reduce to 1 tablet a day and follow-up with PCP    Patient Instructions  Potassium 1 daily  April 13-April 20 =8 mg in the morning and 2 mg in the afternoon  April 20-27: Take 6 mg in the morning and 2 mg in the afternoon  Then 5 am and 2 in pm   Elayne Snare 03/24/2018, 9:03 PM

## 2018-03-22 NOTE — Patient Instructions (Addendum)
Potassium 1 daily  April 13-April 20 =8 mg in the morning and 2 mg in the afternoon  April 20-27: Take 6 mg in the morning and 2 mg in the afternoon  Then 5 am and 2 in pm

## 2018-03-26 ENCOUNTER — Ambulatory Visit: Payer: Medicare Other | Admitting: Physician Assistant

## 2018-03-26 DIAGNOSIS — M170 Bilateral primary osteoarthritis of knee: Secondary | ICD-10-CM

## 2018-03-26 MED ORDER — LIDOCAINE HCL 1 % IJ SOLN
1.5000 mL | INTRAMUSCULAR | Status: AC | PRN
  Administered 2018-03-26: 1.5 mL

## 2018-03-26 MED ORDER — SODIUM HYALURONATE (VISCOSUP) 20 MG/2ML IX SOSY
20.0000 mg | PREFILLED_SYRINGE | INTRA_ARTICULAR | Status: AC | PRN
Start: 2018-03-26 — End: 2018-03-26
  Administered 2018-03-26: 20 mg via INTRA_ARTICULAR

## 2018-03-26 MED ORDER — SODIUM HYALURONATE (VISCOSUP) 20 MG/2ML IX SOSY
20.0000 mg | PREFILLED_SYRINGE | INTRA_ARTICULAR | Status: AC | PRN
  Administered 2018-03-26: 20 mg via INTRA_ARTICULAR

## 2018-03-26 NOTE — Progress Notes (Signed)
   Procedure Note  Patient: Eileen Wilkerson             Date of Birth: [birth year]           MRN: [id number]             Visit Date: 03/26/2018  Procedures: Visit Diagnoses: Primary osteoarthritis of both knees Euflexxa #2 bilateral P/P Large Joint Inj: bilateral knee on 03/26/2018 11:12 AM Indications: pain Details: 27 G 1.5 in needle, medial approach  Arthrogram: No  Medications (Right): 20 mg Sodium Hyaluronate 20 MG/2ML; 1.5 mL lidocaine 1 % Aspirate (Right): 0 mL Medications (Left): 20 mg Sodium Hyaluronate 20 MG/2ML; 1.5 mL lidocaine 1 % Aspirate (Left): 0 mL Outcome: tolerated well, no immediate complications Procedure, treatment alternatives, risks and benefits explained, specific risks discussed. Consent was given by the patient. Immediately prior to procedure a time out was called to verify the correct patient, procedure, equipment, support staff and site/side marked as required. Patient was prepped and draped in the usual sterile fashion.     Patient tolerated procedure well.    Hazel Sams, PA-C

## 2018-03-27 ENCOUNTER — Other Ambulatory Visit: Payer: Self-pay | Admitting: Rheumatology

## 2018-03-27 NOTE — Telephone Encounter (Signed)
Last Visit: 03/14/18 Next Visit: 08/15/18 Labs: 03/01/18 stable  Okay to refill per Dr. Estanislado Pandy

## 2018-04-01 ENCOUNTER — Encounter: Payer: Self-pay | Admitting: Family Medicine

## 2018-04-02 ENCOUNTER — Ambulatory Visit: Payer: Medicare Other | Admitting: Physician Assistant

## 2018-04-02 DIAGNOSIS — M1711 Unilateral primary osteoarthritis, right knee: Secondary | ICD-10-CM

## 2018-04-02 DIAGNOSIS — M1712 Unilateral primary osteoarthritis, left knee: Secondary | ICD-10-CM | POA: Diagnosis not present

## 2018-04-02 DIAGNOSIS — M170 Bilateral primary osteoarthritis of knee: Secondary | ICD-10-CM

## 2018-04-02 MED ORDER — LIDOCAINE HCL 1 % IJ SOLN
1.5000 mL | INTRAMUSCULAR | Status: AC | PRN
  Administered 2018-04-02: 1.5 mL

## 2018-04-02 MED ORDER — SODIUM HYALURONATE (VISCOSUP) 20 MG/2ML IX SOSY
20.0000 mg | PREFILLED_SYRINGE | INTRA_ARTICULAR | Status: AC | PRN
  Administered 2018-04-02: 20 mg via INTRA_ARTICULAR

## 2018-04-02 NOTE — Progress Notes (Signed)
   Procedure Note  Patient: Eileen Wilkerson             Date of Birth: [birth year]           MRN: [id number]             Visit Date: 04/02/2018  Procedures: Visit Diagnoses: Primary osteoarthritis of both knees Euflexxa bilateral knees #3 Large Joint Inj: bilateral knee on 04/02/2018 2:11 PM Indications: pain Details: 27 G 1.5 in needle, medial approach  Arthrogram: No  Medications (Right): 1.5 mL lidocaine 1 %; 20 mg Sodium Hyaluronate 20 MG/2ML Aspirate (Right): 0 mL Medications (Left): 1.5 mL lidocaine 1 %; 20 mg Sodium Hyaluronate 20 MG/2ML Aspirate (Left): 0 mL Outcome: tolerated well, no immediate complications Procedure, treatment alternatives, risks and benefits explained, specific risks discussed. Consent was given by the patient. Immediately prior to procedure a time out was called to verify the correct patient, procedure, equipment, support staff and site/side marked as required. Patient was prepped and draped in the usual sterile fashion.     Patient tolerated the procedure well.   Hazel Sams, PA-C

## 2018-04-04 ENCOUNTER — Ambulatory Visit: Payer: Medicare Other | Admitting: Rheumatology

## 2018-04-10 ENCOUNTER — Encounter: Payer: Self-pay | Admitting: Family Medicine

## 2018-04-10 ENCOUNTER — Ambulatory Visit: Payer: Medicare Other | Admitting: Family Medicine

## 2018-04-10 ENCOUNTER — Other Ambulatory Visit: Payer: Self-pay | Admitting: Neurosurgery

## 2018-04-10 VITALS — BP 138/68 | HR 68 | Temp 97.7°F | Ht 63.25 in | Wt 166.4 lb

## 2018-04-10 DIAGNOSIS — R300 Dysuria: Secondary | ICD-10-CM | POA: Diagnosis not present

## 2018-04-10 DIAGNOSIS — N949 Unspecified condition associated with female genital organs and menstrual cycle: Secondary | ICD-10-CM

## 2018-04-10 LAB — POC URINALSYSI DIPSTICK (AUTOMATED)
Bilirubin, UA: NEGATIVE
Glucose, UA: NEGATIVE
Ketones, UA: NEGATIVE
Leukocytes, UA: NEGATIVE
Nitrite, UA: NEGATIVE
Protein, UA: NEGATIVE
Spec Grav, UA: 1.015 (ref 1.010–1.025)
Urobilinogen, UA: 0.2 E.U./dL
pH, UA: 6.5 (ref 5.0–8.0)

## 2018-04-10 MED ORDER — POTASSIUM CHLORIDE ER 10 MEQ PO TBCR: 10.0000 meq | EXTENDED_RELEASE_TABLET | Freq: Two times a day (BID) | ORAL | 0 refills | Status: DC

## 2018-04-10 MED ORDER — CEPHALEXIN 500 MG PO CAPS: 500.0000 mg | ORAL_CAPSULE | Freq: Three times a day (TID) | ORAL | 0 refills | Status: AC

## 2018-04-10 NOTE — Progress Notes (Signed)
Eileen Wilkerson is a 84 y.o. female is here for an acute visit.   History of Present Illness:   [name], CMA acting as scribe for Dr. Briscoe Deutscher.   HPI: Patient in for possible UTI. She has had abdominal pain at night. No burning with urination but she is having frequency.  No fever or chills. Recent urosepsis. Test at home + for nitrites.   Health Maintenance Due  Topic Date Due  . TETANUS/TDAP  09/07/1951   Depression screen PHQ 2/9 05/08/2017  Decreased Interest 0  Down, Depressed, Hopeless 0  PHQ - 2 Score 0   PMHx, SurgHx, SocialHx, FamHx, Medications, and Allergies were reviewed in the Visit Navigator and updated as appropriate.   Patient Active Problem List   Diagnosis Date Noted  . B12 deficiency 03/11/2018  . AKI (acute kidney injury) ([place]) 02/14/2018  . Idiopathic chronic venous hypertension of both lower extremities with ulcer and inflammation ([place]) 09/18/2017  . Bilateral lower extremity edema 06/30/2017  . Physical deconditioning 05/30/2017  . Immunosuppressed status ([place]) 05/30/2017  . Abnormal chest x-ray 05/17/2017  . Hypokalemia 04/25/2017  . Fibromyalgia 12/18/2016  . DJD (degenerative joint disease), cervical 12/18/2016  . Spondylosis of lumbar region without myelopathy or radiculopathy 12/18/2016  . Trigger finger, right middle finger 12/18/2016  . Primary osteoarthritis of both feet 12/18/2016  . Primary osteoarthritis of both knees 12/18/2016  . GERD (gastroesophageal reflux disease) 12/18/2016  . Age-related osteoporosis without current pathological fracture 12/18/2016  . Rheumatoid arthritis ([place]) 12/28/2015  . Long term current use of systemic steroids, for RA 12/28/2015  . Lymphocytosis 11/13/2012   Social History   Tobacco Use  . Smoking status: Former Smoker    Packs/day: 0.75    Years: 10.00    Pack years: 7.50    Types: Cigarettes    Last attempt to quit: 1968    Years since quitting: 51.3  . Smokeless tobacco: Never  Used  Substance Use Topics  . Alcohol use: No  . Drug use: No   Current Medications and Allergies:   Current Outpatient Medications:  .  acetaminophen (TYLENOL) 500 MG tablet, Take 500 mg by mouth every 6 (six) hours as needed., Disp: , Rfl:  .  atorvastatin (LIPITOR) 20 MG tablet, Take 1 tablet (20 mg total) by mouth at bedtime., Disp: 90 tablet, Rfl: 3 .  Biotin (BIOTIN MAXIMUM STRENGTH) 10 MG TABS, Take 10 mg by mouth daily., Disp: , Rfl:  .  Cholecalciferol (VITAMIN D3) 5000 units CAPS, Take 5,000 Units by mouth daily., Disp: , Rfl:  .  diclofenac sodium (VOLTAREN) 1 % GEL, APPLY 3 GRAMS TO 3 LARGEST JOINTS THREE TIMES DAILY AS NEEDED, Disp: 300 g, Rfl: 5 .  EUFLEXXA 20 MG/2ML SOSY, , Disp: , Rfl:  .  fluticasone (FLONASE) 50 MCG/ACT nasal spray, Place 2 sprays into both nostrils daily., Disp: 16 g, Rfl: 0 .  furosemide (LASIX) 40 MG tablet, Take 40 mg by mouth daily., Disp: , Rfl:  .  OTREXUP 15 MG/0.4ML SOAJ, INJECT ONE PEN SUBCUTANEOUSLY ONCE EVERY WEEK. STORE AT ROOM TEMPERATURE BETWEEN 68 - 74 DEGREES F., Disp: 12 pen, Rfl: 0 .  pantoprazole (PROTONIX) 40 MG tablet, Take 1 tablet (40 mg total) by mouth daily., Disp: 30 tablet, Rfl: 3 .  potassium chloride (K-DUR) 10 MEQ tablet, Take 1 tablet (10 mEq total) by mouth 2 (two) times daily., Disp: 60 tablet, Rfl: 0 .  predniSONE (DELTASONE) 1 MG tablet, Take 1 tablet (1  mg total) by mouth daily with breakfast. Take as directed on tapering dose (Patient taking differently: Take 2 mg by mouth daily with lunch. Take as directed on tapering dose), Disp: 60 tablet, Rfl: 2 .  predniSONE (DELTASONE) 5 MG tablet, 1 tablet at breakfast and taper as instructed (Patient taking differently: Take 10 mg by mouth daily with breakfast. 1 tablet at breakfast and taper as instructed), Disp: 30 tablet, Rfl: 3 .  traMADol (ULTRAM) 50 MG tablet, TAKE 1 TABLET BY MOUTH THREE DAILY AS NEEDED PAIN, Disp: 90 tablet, Rfl: 0   Allergies  Allergen Reactions  .  Adhesive [Tape] Rash  . Celebrex [Celecoxib] Hives  . Ciprofibrate Nausea Only  . Cymbalta [Duloxetine Hcl] Swelling  . Gabitril [Tiagabine] Swelling  . Keflex [Cephalexin] Nausea And Vomiting  . Lyrica [Pregabalin] Swelling  . Neurontin [Gabapentin] Swelling  . Nexium [Esomeprazole] Rash  . Nsaids Rash  . Penicillins Rash    Injection site reaction. Tolerated cefepime in past Has patient had a PCN reaction causing immediate rash, facial/tongue/throat swelling, SOB or lightheadedness with hypotension: No Has patient had a PCN reaction causing severe rash involving mucus membranes or skin necrosis: No Has patient had a PCN reaction that required hospitalization No Has patient had a PCN reaction occurring within the last 10 years: No If all of the above answers are "NO", then may proceed with Cephalosporin use.   . Shrimp [Shellfish Allergy] Anaphylaxis    Per patient "shrimp only"  . Sulfa Antibiotics Nausea And Vomiting  . Azactam [Aztreonam]     Hand swelling   . Ciprofloxacin Other (See Comments)    dizziness  . Zantac [Ranitidine Hcl]   . Claritin-D 12 Hour [Loratadine-Pseudoephedrine Er] Anxiety   Review of Systems   Pertinent items are noted in the HPI. Otherwise, ROS is negative.  Vitals:   Vitals:   04/10/18 1318  BP: 138/68  Pulse: 68  Temp: 97.7 F (36.5 C)  TempSrc: Oral  SpO2: 94%  Weight: 166 lb 6.4 oz (75.5 kg)  Height: 5' 3.25" (1.607 m)     Body mass index is 29.24 kg/m.   Physical Exam:   Physical Exam  Constitutional: She appears well-nourished.  HENT:  Head: Normocephalic and atraumatic.  Eyes: Pupils are equal, round, and reactive to light. EOM are normal.  Neck: Normal range of motion. Neck supple.  Cardiovascular: Normal rate, regular rhythm, normal heart sounds and intact distal pulses.  Pulmonary/Chest: Effort normal.  Abdominal: Soft.  Skin: Skin is warm.  Psychiatric: She has a normal mood and affect. Her behavior is normal.    Nursing note and vitals reviewed.  Results for orders placed or performed in visit on 04/10/18  POCT Urinalysis Dipstick (Automated)  Result Value Ref Range   Color, UA Yellow    Clarity, UA Clear    Glucose, UA Negative    Bilirubin, UA Negative    Ketones, UA Negative    Spec Grav, UA 1.015 1.010 - 1.025   Blood, UA Trace    pH, UA 6.5 5.0 - 8.0   Protein, UA Negative    Urobilinogen, UA 0.2 0.2 or 1.0 E.U./dL   Nitrite, UA Negative    Leukocytes, UA Negative Negative    Assessment and Plan:   S[place]uri was seen today for urinary tract infection.  Diagnoses and all orders for this visit:  Dysuria -     Urine Culture -     POCT Urinalysis Dipstick (Automated) -     cephALEXin (  KEFLEX) 500 MG capsule; Take 1 capsule (500 mg total) by mouth 3 (three) times daily for 5 days.   . Reviewed expectations re: course of current medical issues. . Discussed self-management of symptoms. . Outlined signs and symptoms indicating need for more acute intervention. . Patient verbalized understanding and all questions were answered. Marland Kitchen Health Maintenance issues including appropriate healthy diet, exercise, and smoking avoidance were discussed with patient. . See orders for this visit as documented in the electronic medical record. . Patient received an After Visit Summary.  Briscoe Deutscher, DO [place] [place] 04/10/2018  Future Appointments  Date Time Provider [place]  04/17/2018 10:30 AM [facility] NURSE [facility]  04/22/2018 11:00 AM [facility] [place] [facility]  05/09/2018 11:45 AM [facility] LAB [facility] None  05/13/2018  1:15 PM Elayne Snare, MD [facility] None  08/15/2018  1:30 PM Bo Merino, MD [facility] None

## 2018-04-12 LAB — URINE CULTURE
MICRO NUMBER:: 90531579
SPECIMEN QUALITY:: ADEQUATE

## 2018-04-17 ENCOUNTER — Ambulatory Visit (INDEPENDENT_AMBULATORY_CARE_PROVIDER_SITE_OTHER): Payer: Medicare Other | Admitting: *Deleted

## 2018-04-17 ENCOUNTER — Encounter: Payer: Self-pay | Admitting: *Deleted

## 2018-04-17 DIAGNOSIS — E538 Deficiency of other specified B group vitamins: Secondary | ICD-10-CM | POA: Diagnosis not present

## 2018-04-17 MED ORDER — CYANOCOBALAMIN 1000 MCG/ML IJ SOLN
1000.0000 ug | Freq: Once | INTRAMUSCULAR | Status: AC
  Administered 2018-04-17: 1000 ug via INTRAMUSCULAR

## 2018-04-17 NOTE — Progress Notes (Signed)
Per orders of Dr. Juleen China, injection of Cyanocobalamin 1000 mcg given in Right Deltoid by Anselmo Pickler, LPN Patient tolerated injection well.

## 2018-04-18 ENCOUNTER — Other Ambulatory Visit: Payer: Self-pay | Admitting: Family Medicine

## 2018-04-19 ENCOUNTER — Other Ambulatory Visit: Payer: Self-pay | Admitting: Rheumatology

## 2018-04-19 ENCOUNTER — Other Ambulatory Visit: Payer: Self-pay | Admitting: Family Medicine

## 2018-04-19 NOTE — Telephone Encounter (Signed)
Please advise on refill.

## 2018-04-22 ENCOUNTER — Ambulatory Visit
Admission: RE | Admit: 2018-04-22 | Discharge: 2018-04-22 | Disposition: A | Discharge status: Home / Self Care | Payer: Medicare Other | Source: Ambulatory Visit | Attending: Neurosurgery | Admitting: Neurosurgery

## 2018-04-22 ENCOUNTER — Other Ambulatory Visit: Payer: Self-pay | Admitting: Neurosurgery

## 2018-04-22 DIAGNOSIS — N949 Unspecified condition associated with female genital organs and menstrual cycle: Secondary | ICD-10-CM

## 2018-04-22 NOTE — Telephone Encounter (Signed)
ok 

## 2018-04-22 NOTE — Telephone Encounter (Signed)
Last Visit: 04/02/18 Next visit: 08/15/18 Labs: 03/01/18 WBC 15.7 Creat. 1.23 previously 1.45 (On prednisone) PLQ eye exam:04/16/18 Normal  Okay to refill PLQ?

## 2018-04-27 ENCOUNTER — Encounter: Payer: Self-pay | Admitting: Endocrinology

## 2018-04-29 ENCOUNTER — Other Ambulatory Visit: Payer: Self-pay

## 2018-04-29 MED ORDER — PREDNISONE 1 MG PO TABS: 2.0000 mg | ORAL_TABLET | Freq: Every day | ORAL | 1 refills | Status: DC

## 2018-05-08 ENCOUNTER — Telehealth: Payer: Self-pay | Admitting: Family Medicine

## 2018-05-08 NOTE — Telephone Encounter (Signed)
Called the the patient to reschedule Medicare wellness visit from June 4th to June 27th. Her AMV is scheduled on a Tuesday and the next available is June 27th. She said that she would be dead by then. I reschedule her appointment and she agreed. Before I could say anything else she hung up on me.

## 2018-05-08 NOTE — Telephone Encounter (Signed)
Tried to call the patient to discuss. Patient did not answer and no voice mail.

## 2018-05-08 NOTE — Telephone Encounter (Signed)
I attempted to contact the patient to speak to her about any other concerns she may have due to the statement the patient made "that she would be dead by 06/24/2023 for her AWV."   I was unable to leave a voicemail as the phone continued to ring and unfortunately no answer.

## 2018-05-09 ENCOUNTER — Other Ambulatory Visit (INDEPENDENT_AMBULATORY_CARE_PROVIDER_SITE_OTHER): Payer: Medicare Other

## 2018-05-09 LAB — BASIC METABOLIC PANEL
BUN: 23 mg/dL (ref 6–23)
CO2: 32 mEq/L (ref 19–32)
Calcium: 10.4 mg/dL (ref 8.4–10.5)
Chloride: 102 mEq/L (ref 96–112)
Creatinine, Ser: 1.11 mg/dL (ref 0.40–1.20)
GFR: 49.81 mL/min — ABNORMAL LOW (ref >60.00)
Glucose, Bld: 79 mg/dL (ref 70–99)
Potassium: 3.5 mEq/L (ref 3.5–5.1)
Sodium: 142 mEq/L (ref 135–145)

## 2018-05-10 NOTE — Telephone Encounter (Addendum)
I spoke with patient to make sure that every thing is ok with the comment that was made. Patient stated that she was fine and she did not mean the comment. She stated that she was frustraed due to the appointment be rescheduled a couple times since February. I apologized for the inconvenience. Patient was fine at the end of conversation.   During the phone call patient stated that she did fall last week and hurt her back. She was not seen by a physician for this. She did discuss with her Physical therapist and he told her to put ice on it. She will call us next week to schedule an appointment if it does not get better.

## 2018-05-12 NOTE — Progress Notes (Signed)
Patient ID: Eileen Wilkerson, female   DOB: 12-10-1932, 84 y.o.   MRN: [id number]            Chief complaint: Adjust steroid dosage  History of Present Illness:   Several years ago the patient was having recurrent steroid injections for her back and was told that she had persistent adrenal suppression but no details are available She was told that her cortisol levels were low and she was started on prednisone and kept on a maintenance dose of 7 mg daily Subsequently she developed rheumatoid arthritis and was told to continue prednisone for this reason also  She had been followed periodically by an endocrinologist until about a year and a half ago for monitoring her prednisone doses  She had 2 admissions to the hospital in February, once for UTI and another one for unknown febrile illness with GI symptoms Although she was not hypotensive at the admission time he had low normal blood pressure readings and was given higher doses of steroids, which doing and on discharge from the hospital on 02/06/18  Although she was discharged on 80 mg of prednisone this caused severe insomnia and this was reduced in a few days down to 50 mg On her initial consultation in 3/19 she was taking 20 mg twice daily  She was given a tapering schedule on reducing her prednisone in March with rapid tapering schedule Subsequently in April she was also given a new tapering schedules for prednisone when she was taking about 12 mg daily total  Now she is taking 5 mg in the morning and 2 mg the evening for over a month without any difficulty  Subsequently she has been feeling fairly good with no lightheadedness, weakness, nausea or change in appetite Weight is stable and no change in electrolytes  Wt Readings from Last 3 Encounters:  05/13/18 164 lb 12.8 oz (74.8 kg)  04/10/18 166 lb 6.4 oz (75.5 kg)  03/22/18 165 lb (74.8 kg)   . Lab Results  Component Value Date   CREATININE 1.11 05/09/2018   BUN 23  05/09/2018   NA 142 05/09/2018   K 3.5 05/09/2018   CL 102 05/09/2018   CO2 32 05/09/2018     Past Medical History:  Diagnosis Date  . Adrenal failure ([place])   . Arthritis   . Cataract   . Closed nondisplaced fracture of fifth right metatarsal bone 09/18/2017  . Fibromyalgia 2008  . HCAP (healthcare-associated pneumonia) 02/03/2018  . Osteoporosis   . RA (rheumatoid arthritis) ([place])   . Sepsis due to urinary tract infection ([place]) 01/18/2018    Past Surgical History:  Procedure Laterality Date  . BACK SURGERY    . BILATERAL CARPAL TUNNEL RELEASE    . CATARACT EXTRACTION, BILATERAL  2004  . CERVICAL FUSION  2011,2010,2008  . HEEL SPUR SURGERY  2004  . ROTATOR CUFF REPAIR    . TONSILLECTOMY AND ADENOIDECTOMY  1947  . TOTAL SHOULDER ARTHROPLASTY      Family History  Problem Relation Age of Onset  . Breast cancer Mother   . Diabetes Father     Social History:  reports that she quit smoking about 51 years ago. Her smoking use included cigarettes. She has a 7.50 pack-year smoking history. She has never used smokeless tobacco. She reports that she does not drink alcohol or use drugs.  Allergies:  Allergies  Allergen Reactions  . Adhesive [Tape] Rash  . Celebrex [Celecoxib] Hives  . Ciprofibrate Nausea Only  . Cymbalta [  Duloxetine Hcl] Swelling  . Gabitril [Tiagabine] Swelling  . Keflex [Cephalexin] Nausea And Vomiting  . Lyrica [Pregabalin] Swelling  . Neurontin [Gabapentin] Swelling  . Nexium [Esomeprazole] Rash  . Nsaids Rash  . Penicillins Rash    Injection site reaction. Tolerated cefepime in past Has patient had a PCN reaction causing immediate rash, facial/tongue/throat swelling, SOB or lightheadedness with hypotension: No Has patient had a PCN reaction causing severe rash involving mucus membranes or skin necrosis: No Has patient had a PCN reaction that required hospitalization No Has patient had a PCN reaction occurring within the last 10 years: No If all of the  above answers are "NO", then may proceed with Cephalosporin use.   . Shrimp [Shellfish Allergy] Anaphylaxis    Per patient "shrimp only"  . Sulfa Antibiotics Nausea And Vomiting  . Azactam [Aztreonam]     Hand swelling   . Ciprofloxacin Other (See Comments)    dizziness  . Zantac [Ranitidine Hcl]   . Claritin-D 12 Hour [Loratadine-Pseudoephedrine Er] Anxiety    Allergies as of 05/13/2018      Reactions   Adhesive [tape] Rash   Celebrex [celecoxib] Hives   Ciprofibrate Nausea Only   Cymbalta [duloxetine Hcl] Swelling   Gabitril [tiagabine] Swelling   Keflex [cephalexin] Nausea And Vomiting   Lyrica [pregabalin] Swelling   Neurontin [gabapentin] Swelling   Nexium [esomeprazole] Rash   Nsaids Rash   Penicillins Rash   Injection site reaction. Tolerated cefepime in past Has patient had a PCN reaction causing immediate rash, facial/tongue/throat swelling, SOB or lightheadedness with hypotension: No Has patient had a PCN reaction causing severe rash involving mucus membranes or skin necrosis: No Has patient had a PCN reaction that required hospitalization No Has patient had a PCN reaction occurring within the last 10 years: No If all of the above answers are "NO", then may proceed with Cephalosporin use.   Shrimp [shellfish Allergy] Anaphylaxis   Per patient "shrimp only"   Sulfa Antibiotics Nausea And Vomiting   Azactam [aztreonam]    Hand swelling    Ciprofloxacin Other (See Comments)   dizziness   Zantac [ranitidine Hcl]    Claritin-d 12 Hour [loratadine-pseudoephedrine Er] Anxiety      Medication List        Accurate as of 05/13/18  1:57 PM. Always use your most recent med list.          acetaminophen 500 MG tablet Commonly known as:  TYLENOL Take 500 mg by mouth every 6 (six) hours as needed.   atorvastatin 20 MG tablet Commonly known as:  LIPITOR Take 1 tablet (20 mg total) by mouth at bedtime.   BIOTIN MAXIMUM STRENGTH 10 MG Tabs Generic drug:  Biotin Take 10  mg by mouth daily.   diclofenac sodium 1 % Gel Commonly known as:  VOLTAREN APPLY 3 GRAMS TO 3 LARGEST JOINTS THREE TIMES DAILY AS NEEDED   fluticasone 50 MCG/ACT nasal spray Commonly known as:  FLONASE USE 2 SPRAY(S) IN EACH NOSTRIL ONCE DAILY   hydroxychloroquine 200 MG tablet Commonly known as:  PLAQUENIL TAKE 1 TABLET BY MOUTH ONCE DAILY   LASIX 40 MG tablet Generic drug:  furosemide Take 40 mg by mouth daily.   OTREXUP 15 MG/0.4ML Soaj Generic drug:  Methotrexate (PF) INJECT ONE PEN SUBCUTANEOUSLY ONCE EVERY WEEK. STORE AT ROOM TEMPERATURE BETWEEN 68 - 77 DEGREES F.   pantoprazole 40 MG tablet Commonly known as:  PROTONIX Take 1 tablet (40 mg total) by mouth daily.  potassium chloride 10 MEQ tablet Commonly known as:  K-DUR TAKE 1 TABLET BY MOUTH TWICE DAILY   predniSONE 5 MG tablet Commonly known as:  DELTASONE 1 tablet at breakfast and taper as instructed   predniSONE 1 MG tablet Commonly known as:  DELTASONE Take 2 tablets (2 mg total) by mouth daily with lunch. Take as directed on tapering dose   traMADol 50 MG tablet Commonly known as:  ULTRAM TAKE 1 TABLET BY MOUTH THREE TIMES DAILY AS NEEDED   Vitamin D3 5000 units Caps Take 5,000 Units by mouth daily.       LABS: Last vitamin D level:   Lab Results  Component Value Date   VD25OH 51.95 02/21/2018         Review of Systems   On vitamin D description, taking 50,000 units weekly, followed by rheumatologist   PHYSICAL EXAM:  BP 124/79 (BP Location: Left Arm, Patient Position: Standing, Cuff Size: Normal)   Pulse 100   Ht 5' 3.25" (1.607 m)   Wt 164 lb 12.8 oz (74.8 kg)   SpO2 92%   BMI 28.96 kg/m     ASSESSMENT:   Long-term use of corticosteroids with history of adrenal suppression.    Currently taking her maintenance dose of 5 mg in the morning and 2 mg the evening She is doing very well subjectively and has no orthostatic change in blood pressure today  HYPERCALCEMIA: She  had a transient increase in calcium with normal PTH and this was normal back on 03/01/18  However her calcium is upper normal again May have mild hyperparathyroidism and this may need to be followed periodically     PLAN:   Continue 5 mg prednisone in the morning and 2 mg in the early evening She was advised to take stress doses up to 20 mg a day when she has any acute illness or going for any procedure She is on a stable dose and doing well she can continue to follow-up with her rheumatologist  Also need to follow-up with rheumatologist for osteopenia periodic monitoring of calcium levels She can take 500 mg of calcium supplement a day with her main meal   There are no Patient Instructions on file for this visit.  Elayne Snare 05/13/2018, 1:57 PM     Note: This office note was prepared with Dragon voice recognition system technology. Any transcriptional errors that result from this process are unintentional.

## 2018-05-13 ENCOUNTER — Ambulatory Visit: Payer: Medicare Other | Admitting: Endocrinology

## 2018-05-13 ENCOUNTER — Other Ambulatory Visit: Payer: Self-pay | Admitting: Family Medicine

## 2018-05-13 ENCOUNTER — Encounter: Payer: Self-pay | Admitting: Endocrinology

## 2018-05-13 VITALS — BP 124/79 | HR 100 | Ht 63.25 in | Wt 164.8 lb

## 2018-05-13 DIAGNOSIS — E2740 Unspecified adrenocortical insufficiency: Secondary | ICD-10-CM | POA: Diagnosis not present

## 2018-05-13 DIAGNOSIS — T380X5A Adverse effect of glucocorticoids and synthetic analogues, initial encounter: Secondary | ICD-10-CM

## 2018-05-13 NOTE — Patient Instructions (Signed)
No change 

## 2018-05-14 ENCOUNTER — Ambulatory Visit: Payer: Medicare Other | Admitting: *Deleted

## 2018-05-15 ENCOUNTER — Encounter: Payer: Self-pay | Admitting: Rheumatology

## 2018-05-15 NOTE — Progress Notes (Signed)
Office Visit Note  Patient: Eileen Wilkerson             Date of Birth: October 25, 1932           MRN: [id number]             PCP: Briscoe Deutscher, DO Referring: Briscoe Deutscher, DO Visit Date: 05/16/2018 Occupation: @GUAROCC @    Subjective:  Right wrist pain    History of Present Illness: Eileen Wilkerson is a 84 y.o. female with history of seropositive rheumatoid arthritis, osteoarthritis, fibromyalgia, and osteoporosis.  She is on Plaquenil 200 mg 1 tablet daily and Prednisone 7 mg daily. She has been off of Otrexup 15 mg/0.4 ml since January 2019 due to not resuming after being hospitalized for sepsis x2.  She reports on Tuesday night she developed right wrist pain and swelling.  She also noticed swelling and pain in right 2nd and 3rd MCP joints.  She states she wore her right wrist carpal tunnel splint at night to help limit the ROM.  She states she has noticed some swelling and pain in the left hand.  She has been taking Tramadol and tylenol.  She denies any other joint pain or joint swelling.  She reports her knee joints have been doing well.     Activities of Daily Living:  Patient reports morning stiffness for 2  hours.   Patient Denies nocturnal pain.  Difficulty dressing/grooming: Denies Difficulty climbing stairs: Reports Difficulty getting out of chair: Denies Difficulty using hands for taps, buttons, cutlery, and/or writing: Denies   Review of Systems  Constitutional: Positive for fatigue.  HENT: Positive for mouth dryness. Negative for mouth sores and nose dryness.   Eyes: Positive for dryness. Negative for pain and visual disturbance.  Respiratory: Negative for cough, hemoptysis, shortness of breath and difficulty breathing.   Cardiovascular: Negative for chest pain, palpitations, hypertension and swelling in legs/feet.  Gastrointestinal: Positive for constipation. Negative for blood in stool and diarrhea.  Endocrine: Negative for increased urination.  Genitourinary:  Negative for painful urination.  Musculoskeletal: Positive for arthralgias, joint pain, joint swelling and morning stiffness. Negative for myalgias, muscle weakness, muscle tenderness and myalgias.  Skin: Negative for color change, pallor, rash, hair loss, nodules/bumps, skin tightness, ulcers and sensitivity to sunlight.  Allergic/Immunologic: Negative for susceptible to infections.  Neurological: Negative for dizziness, numbness, headaches and weakness.  Hematological: Negative for swollen glands.  Psychiatric/Behavioral: Negative for depressed mood and sleep disturbance. The patient is not nervous/anxious.     PMFS History:  Patient Active Problem List   Diagnosis Date Noted  . B12 deficiency 03/11/2018  . AKI (acute kidney injury) ([place]) 02/14/2018  . Idiopathic chronic venous hypertension of both lower extremities with ulcer and inflammation ([place]) 09/18/2017  . Bilateral lower extremity edema 06/30/2017  . Physical deconditioning 05/30/2017  . Immunosuppressed status ([place]) 05/30/2017  . Abnormal chest x-ray 05/17/2017  . Hypokalemia 04/25/2017  . Fibromyalgia 12/18/2016  . DJD (degenerative joint disease), cervical 12/18/2016  . Spondylosis of lumbar region without myelopathy or radiculopathy 12/18/2016  . Trigger finger, right middle finger 12/18/2016  . Primary osteoarthritis of both feet 12/18/2016  . Primary osteoarthritis of both knees 12/18/2016  . GERD (gastroesophageal reflux disease) 12/18/2016  . Age-related osteoporosis without current pathological fracture 12/18/2016  . Rheumatoid arthritis ([place]) 12/28/2015  . Long term current use of systemic steroids, for RA 12/28/2015  . Lymphocytosis 11/13/2012    Past Medical History:  Diagnosis Date  . Adrenal failure ([place])   .  Arthritis   . Cataract   . Closed nondisplaced fracture of fifth right metatarsal bone 09/18/2017  . Fibromyalgia 2008  . HCAP (healthcare-associated pneumonia) 02/03/2018  . Osteoporosis   . RA  (rheumatoid arthritis) ([place])   . Sepsis due to urinary tract infection ([place]) 01/18/2018    Family History  Problem Relation Age of Onset  . Breast cancer Mother   . Diabetes Father    Past Surgical History:  Procedure Laterality Date  . BACK SURGERY    . BILATERAL CARPAL TUNNEL RELEASE    . CATARACT EXTRACTION, BILATERAL  2004  . CERVICAL FUSION  2011,2010,2008  . HEEL SPUR SURGERY  2004  . ROTATOR CUFF REPAIR    . TONSILLECTOMY AND ADENOIDECTOMY  1947  . TOTAL SHOULDER ARTHROPLASTY     Social History   Social History Narrative  . Not on file     Objective: Vital Signs: BP 131/72 (BP Location: Left Arm, Patient Position: Sitting, Cuff Size: Normal)   Pulse 62   Resp 15   Ht 5' 4.25" (1.632 m)   Wt 168 lb (76.2 kg)   BMI 28.61 kg/m    Physical Exam  Constitutional: She is oriented to person, place, and time. She appears well-developed and well-nourished.  HENT:  Head: Normocephalic and atraumatic.  Eyes: Conjunctivae and EOM are normal.  Neck: Normal range of motion.  Cardiovascular: Normal rate, regular rhythm, normal heart sounds and intact distal pulses.  Pulmonary/Chest: Effort normal and breath sounds normal.  Abdominal: Soft. Bowel sounds are normal.  Lymphadenopathy:    She has no cervical adenopathy.  Neurological: She is alert and oriented to person, place, and time.  Skin: Skin is warm and dry. Capillary refill takes less than 2 seconds.  Psychiatric: She has a normal mood and affect. Her behavior is normal.  Nursing note and vitals reviewed.    Musculoskeletal Exam: C-spine very limited range of motion with discomfort.  Mild thoracic kyphosis.  She has no midline spinal tenderness.  No SI joint tenderness.  Shoulder joints, elbow joints, wrist joints, MCPs, PIPs, and DIPs good ROM with no synovitis.  She has synovial thickening of right 2nd and 3rd MCP joints.  Left 2nd MCP joint synovial thickening.  PIP and DIP synovial thickening consistent with  osteoarthritis.  Hip joints, knee joints, ankle joints, MTPs, PIPs, DIPs good range of motion no synovitis.  No warmth or effusion knee joints.  She has bilateral knee crepitus.  She has PIP and DIP synovial thickening consistent with osteoarthritis of bilateral hands.  CDAI Exam: CDAI Homunculus Exam:   Tenderness:  RUE: wrist Right hand: 2nd MCP, 3rd MCP and 4th MCP  Joint Counts:  CDAI Tender Joint count: 4 CDAI Swollen Joint count: 0  Global Assessments:  Patient Global Assessment: 8 Provider Global Assessment: 8  CDAI Calculated Score: 20    Investigation: No additional findings. CBC Latest Ref Rng & Units 03/01/2018 02/13/2018 02/06/2018  WBC 3.8 - 10.8 Thousand/uL 15.7(H) 19.0 Repeated and verified X2.([place]) 10.6(H)  Hemoglobin 11.7 - 15.5 g/dL 13.1 13.4 11.5(L)  Hematocrit 35.0 - 45.0 % 38.6 41.2 35.0(L)  Platelets 140 - 400 Thousand/uL 262 307.0 276   CMP Latest Ref Rng & Units 05/09/2018 03/01/2018 02/21/2018  Glucose 70 - 99 mg/dL 79 158(H) 125(H)  BUN 6 - 23 mg/dL 23 37(H) 42(H)  Creatinine 0.40 - 1.20 mg/dL 1.11 1.23(H) 1.45(H)  Sodium 135 - 145 mEq/L 142 138 137  Potassium 3.5 - 5.1 mEq/L 3.5 4.5 4.8  Chloride 96 - 112 mEq/L 102 99 98  CO2 19 - 32 mEq/L 32 29 33(H)  Calcium 8.4 - 10.5 mg/dL 10.4 9.6 10.7(H)  Total Protein 6.1 - 8.1 g/dL - 6.9 -  Total Bilirubin 0.2 - 1.2 mg/dL - 0.7 -  Alkaline Phos 39 - 117 U/L - - -  AST 10 - 35 U/L - 20 -  ALT 6 - 29 U/L - 25 -    Imaging: US Pelvis (transabdominal Only)  Result Date: 04/22/2018 CLINICAL DATA:  84 year old female with left adnexal cyst noted on recent MR. Subsequent encounter. EXAM: TRANSABDOMINAL ULTRASOUND OF PELVIS TECHNIQUE: Transabdominal ultrasound examination of the pelvis was performed including evaluation of the uterus, ovaries, adnexal regions, and pelvic cul-de-sac. Patient declined transvaginal imaging. COMPARISON:  03/27/2015 MR. 02/03/2018 CT. FINDINGS: Uterus Measurements: 5.3 x 1.6 x 2.2 cm. No  fibroids or other mass visualized. Endometrium Thickness: 2 mm.  No focal abnormality visualized. Right ovary Not visualized. Left ovary Measurements: 4.7 x 3.1 x 3.4 cm. 2.8 x 2.6 x 3 cm simple appearing ovarian cyst. Other findings:  No abnormal free fluid. IMPRESSION: 3 cm simple appearing left ovarian cyst. Consensus guidelines recommend yearly follow-up. Right ovary not visualized. Electronically Signed   By: Genia Del M.D.   On: 04/22/2018 18:47    Speciality Comments: PLQ eye exam:04/16/18 Normal. [facility] Follow up in 6 months.    Procedures:  No procedures performed Allergies: Adhesive [tape]; Celebrex [celecoxib]; Ciprofibrate; Cymbalta [duloxetine hcl]; Gabitril [tiagabine]; Keflex [cephalexin]; Lyrica [pregabalin]; Neurontin [gabapentin]; Nexium [esomeprazole]; Nsaids; Penicillins; Shrimp [shellfish allergy]; Sulfa antibiotics; Azactam [aztreonam]; Ciprofloxacin; Zantac [ranitidine hcl]; and Claritin-d 12 hour [loratadine-pseudoephedrine er]   Assessment / Plan:     Visit Diagnoses: Rheumatoid arthritis involving multiple sites with positive rheumatoid factor ([place]): She has no synovitis.  She has synovial thickening of right 2nd and 3rd MCP and left 2nd MCP joints.  She is in the process of moving and has been more active and under more stress, and she developed increased pain and swelling in her right wrist and hand on Tuesday.  She has no synovitis on exam today.  She will continue taking Plaquenil 200 mg 1 tablet by mouth daily and prednisone 7 mg daily. We will not start her back on Otrexup due to the immunosuppressive effects leading to 2 hospitalizations for management of sepsis in February 2019.  She will continue on the current treatment regimen.  She has been taking tylenol and tramadol for pain relief.   High risk medication use -  PLQ 200 mg 1 tablet by mouth daily, prednisone 7 mg daily. BMP was ordered on   Primary osteoarthritis of both hands: She has PIP and  DIP synovial thickening consistent with osteoarthritis of both hands.  Joint protection and muscle strengthening were discussed.    Primary osteoarthritis of both knees: No warmth or effusion of knee joints.  She has bilateral knee crepitus.   She had bilateral Euflexxa injections on 03/26/18.  Primary osteoarthritis of both feet: She has PIP and DIP synovial thickening consistent with osteoarthritis of bilateral feet.  She experiences discomfort in bilateral feet.  She has no synovitis on exam.  The importance of wearing proper fitting shoes was discussed.  Age-related osteoporosis without current pathological fracture - Her DEXA is followed up by her PCP.  She is no longer having reclast infusions due to improvement in her DEXA scan results.   Fibromyalgia: She continues to have generalized muscle tenderness and muscle aches.  DDD (degenerative disc disease), cervical: She has very limited ROM with discomfort.    Adrenal insufficiency ([place]): She is on Prednisone 7 mg daily.   Other medical conditions are listed as follows:   History of CHF (congestive heart failure)  History of fatigue  Long term current use of systemic steroids, for RA    Orders: No orders of the defined types were placed in this encounter.  No orders of the defined types were placed in this encounter.     Follow-Up Instructions: Return for Rheumatoid arthritis, Osteoarthritis, Fibromyalgia, Osteoporosis.   Ofilia Neas, PA-C   I examined and evaluated the patient with Hazel Sams PA.  Patient had no synovitis currently.  We had detailed discussion she will hold off methotrexate for right now since.  The plan of care was discussed as noted above.  Bo Merino, MD  Note - This record has been created using Editor, commissioning.  Chart creation errors have been sought, but may not always  have been located. Such creation errors do not reflect on  the standard of medical care.

## 2018-05-15 NOTE — Telephone Encounter (Signed)
Patient has an appointment 05/16/18

## 2018-05-16 ENCOUNTER — Encounter: Payer: Self-pay | Admitting: Physician Assistant

## 2018-05-16 ENCOUNTER — Ambulatory Visit: Payer: Medicare Other | Admitting: Physician Assistant

## 2018-05-16 VITALS — BP 131/72 | HR 62 | Resp 15 | Ht 64.25 in | Wt 168.0 lb

## 2018-05-16 DIAGNOSIS — E2740 Unspecified adrenocortical insufficiency: Secondary | ICD-10-CM | POA: Diagnosis not present

## 2018-05-16 DIAGNOSIS — M797 Fibromyalgia: Secondary | ICD-10-CM | POA: Diagnosis not present

## 2018-05-16 DIAGNOSIS — M19041 Primary osteoarthritis, right hand: Secondary | ICD-10-CM | POA: Diagnosis not present

## 2018-05-16 DIAGNOSIS — M19072 Primary osteoarthritis, left ankle and foot: Secondary | ICD-10-CM

## 2018-05-16 DIAGNOSIS — Z87898 Personal history of other specified conditions: Secondary | ICD-10-CM | POA: Diagnosis not present

## 2018-05-16 DIAGNOSIS — M810 Age-related osteoporosis without current pathological fracture: Secondary | ICD-10-CM

## 2018-05-16 DIAGNOSIS — M170 Bilateral primary osteoarthritis of knee: Secondary | ICD-10-CM | POA: Diagnosis not present

## 2018-05-16 DIAGNOSIS — Z7952 Long term (current) use of systemic steroids: Secondary | ICD-10-CM | POA: Diagnosis not present

## 2018-05-16 DIAGNOSIS — Z79899 Other long term (current) drug therapy: Secondary | ICD-10-CM | POA: Diagnosis not present

## 2018-05-16 DIAGNOSIS — M0579 Rheumatoid arthritis with rheumatoid factor of multiple sites without organ or systems involvement: Secondary | ICD-10-CM | POA: Diagnosis not present

## 2018-05-16 DIAGNOSIS — M19042 Primary osteoarthritis, left hand: Secondary | ICD-10-CM

## 2018-05-16 DIAGNOSIS — Z8679 Personal history of other diseases of the circulatory system: Secondary | ICD-10-CM | POA: Diagnosis not present

## 2018-05-16 DIAGNOSIS — M5030 Other cervical disc degeneration, unspecified cervical region: Secondary | ICD-10-CM | POA: Diagnosis not present

## 2018-05-16 DIAGNOSIS — M19071 Primary osteoarthritis, right ankle and foot: Secondary | ICD-10-CM

## 2018-05-20 ENCOUNTER — Other Ambulatory Visit: Payer: Self-pay | Admitting: Family Medicine

## 2018-05-21 ENCOUNTER — Ambulatory Visit (INDEPENDENT_AMBULATORY_CARE_PROVIDER_SITE_OTHER): Payer: Medicare Other

## 2018-05-21 DIAGNOSIS — E538 Deficiency of other specified B group vitamins: Secondary | ICD-10-CM | POA: Diagnosis not present

## 2018-05-21 MED ORDER — CYANOCOBALAMIN 1000 MCG/ML IJ SOLN
1000.0000 ug | Freq: Once | INTRAMUSCULAR | Status: AC
  Administered 2018-05-21: 1000 ug via INTRAMUSCULAR

## 2018-05-21 NOTE — Progress Notes (Signed)
Per orders of Dr. Juleen China , injection of B-12 given by Francella Solian. Injection given in left deltoid  Patient tolerated injection well. Patient will make appointment for next injection in 1 month.

## 2018-05-27 ENCOUNTER — Encounter: Payer: Self-pay | Admitting: Rheumatology

## 2018-05-30 MED ORDER — PREDNISONE 1 MG PO TABS: 2.0000 mg | ORAL_TABLET | Freq: Every day | ORAL | 1 refills | Status: DC

## 2018-05-30 NOTE — Telephone Encounter (Signed)
Last Visit: 05/16/18 Next visit: 08/15/18  Okay to refill per D.r Estanislado Pandy

## 2018-06-06 ENCOUNTER — Ambulatory Visit: Payer: Medicare Other | Admitting: *Deleted

## 2018-06-11 ENCOUNTER — Other Ambulatory Visit: Payer: Self-pay | Admitting: Family Medicine

## 2018-06-12 NOTE — Telephone Encounter (Signed)
Ok to refill tramadol 

## 2018-06-17 ENCOUNTER — Other Ambulatory Visit: Payer: Self-pay | Admitting: Family Medicine

## 2018-06-18 NOTE — Telephone Encounter (Signed)
Please advise on refill.

## 2018-06-25 ENCOUNTER — Encounter: Payer: Self-pay | Admitting: Rheumatology

## 2018-06-26 ENCOUNTER — Ambulatory Visit: Payer: Medicare Other | Admitting: Physician Assistant

## 2018-06-26 ENCOUNTER — Encounter: Payer: Self-pay | Admitting: Physician Assistant

## 2018-06-26 ENCOUNTER — Ambulatory Visit (INDEPENDENT_AMBULATORY_CARE_PROVIDER_SITE_OTHER): Payer: Medicare Other

## 2018-06-26 VITALS — BP 120/68 | HR 76 | Temp 97.8°F | Ht 64.25 in | Wt 163.0 lb

## 2018-06-26 DIAGNOSIS — R300 Dysuria: Secondary | ICD-10-CM | POA: Diagnosis not present

## 2018-06-26 DIAGNOSIS — E538 Deficiency of other specified B group vitamins: Secondary | ICD-10-CM

## 2018-06-26 LAB — POCT URINALYSIS DIPSTICK
Bilirubin, UA: NEGATIVE
Glucose, UA: NEGATIVE
Ketones, UA: NEGATIVE
Nitrite, UA: NEGATIVE
Protein, UA: NEGATIVE
Spec Grav, UA: 1.015 (ref 1.010–1.025)
Urobilinogen, UA: 0.2 E.U./dL
pH, UA: 6 (ref 5.0–8.0)

## 2018-06-26 MED ORDER — CEPHALEXIN 500 MG PO CAPS: 500.0000 mg | ORAL_CAPSULE | Freq: Three times a day (TID) | ORAL | 0 refills | Status: AC

## 2018-06-26 MED ORDER — CYANOCOBALAMIN 1000 MCG/ML IJ SOLN
1000.0000 ug | Freq: Once | INTRAMUSCULAR | Status: AC
  Administered 2018-06-26: 1000 ug via INTRAMUSCULAR

## 2018-06-26 NOTE — Progress Notes (Signed)
Eileen Wilkerson is a 84 y.o. female here for a new problem.  I acted as a Education administrator for [employer], PA-C Anselmo Pickler, LPN  History of Present Illness:   Chief Complaint  Patient presents with  . Dysuria  . Urinary Frequency    Dysuria   This is a new problem. Episode onset: Started few days. The problem occurs every urination. The problem has been gradually worsening. The quality of the pain is described as aching and burning. The pain is at a severity of 8/10. The pain is moderate. There has been no fever. She is not sexually active. There is no history of pyelonephritis. Associated symptoms include frequency and urgency. Pertinent negatives include no chills, discharge, hematuria, nausea, possible pregnancy or vomiting. Associated symptoms comments: Pt has low back pain chronic due to herniated disk. She has tried acetaminophen and increased fluids for the symptoms. Her past medical history is significant for recurrent UTIs. There is no history of kidney stones.   She reports that she took a home urine test that was positive for "something", but she does not remember what it was.  She is taking her cranberry pills.  She has multiple medication allergies and intolerances.  Per chart review she had a UTI back in May 2019, and was prescribed Keflex.  She states that she tolerated this well.  This medication however is on her allergy list.   Past Medical History:  Diagnosis Date  . Adrenal failure ([place])   . Arthritis   . Cataract   . Closed nondisplaced fracture of fifth right metatarsal bone 09/18/2017  . Fibromyalgia 2008  . HCAP (healthcare-associated pneumonia) 02/03/2018  . Osteoporosis   . RA (rheumatoid arthritis) ([place])   . Sepsis due to urinary tract infection ([place]) 01/18/2018     Social History   Socioeconomic History  . Marital status: Widowed    Spouse name: Not on file  . Number of children: Not on file  . Years of education: Not on file  . Highest education  level: Not on file  Occupational History  . Not on file  Social Needs  . Financial resource strain: Not on file  . Food insecurity:    Worry: Not on file    Inability: Not on file  . Transportation needs:    Medical: Not on file    Non-medical: Not on file  Tobacco Use  . Smoking status: Former Smoker    Packs/day: 0.75    Years: 10.00    Pack years: 7.50    Types: Cigarettes    Last attempt to quit: 1968    Years since quitting: 51.5  . Smokeless tobacco: Never Used  Substance and Sexual Activity  . Alcohol use: No  . Drug use: No  . Sexual activity: Not on file  Lifestyle  . Physical activity:    Days per week: Not on file    Minutes per session: Not on file  . Stress: Not on file  Relationships  . Social connections:    Talks on phone: Not on file    Gets together: Not on file    Attends religious service: Not on file    Active member of club or organization: Not on file    Attends meetings of clubs or organizations: Not on file    Relationship status: Not on file  . Intimate partner violence:    Fear of current or ex partner: Not on file    Emotionally abused: Not on file  Physically abused: Not on file    Forced sexual activity: Not on file  Other Topics Concern  . Not on file  Social History Narrative  . Not on file    Past Surgical History:  Procedure Laterality Date  . BACK SURGERY    . BILATERAL CARPAL TUNNEL RELEASE    . CATARACT EXTRACTION, BILATERAL  2004  . CERVICAL FUSION  2011,2010,2008  . HEEL SPUR SURGERY  2004  . ROTATOR CUFF REPAIR    . TONSILLECTOMY AND ADENOIDECTOMY  1947  . TOTAL SHOULDER ARTHROPLASTY      Family History  Problem Relation Age of Onset  . Breast cancer Mother   . Diabetes Father     Allergies  Allergen Reactions  . Adhesive [Tape] Rash  . Celebrex [Celecoxib] Hives  . Ciprofibrate Nausea Only  . Cymbalta [Duloxetine Hcl] Swelling  . Gabitril [Tiagabine] Swelling  . Keflex [Cephalexin] Nausea And Vomiting   . Lyrica [Pregabalin] Swelling  . Neurontin [Gabapentin] Swelling  . Nexium [Esomeprazole] Rash  . Nsaids Rash  . Penicillins Rash    Injection site reaction. Tolerated cefepime in past Has patient had a PCN reaction causing immediate rash, facial/tongue/throat swelling, SOB or lightheadedness with hypotension: No Has patient had a PCN reaction causing severe rash involving mucus membranes or skin necrosis: No Has patient had a PCN reaction that required hospitalization No Has patient had a PCN reaction occurring within the last 10 years: No If all of the above answers are "NO", then may proceed with Cephalosporin use.   . Shrimp [Shellfish Allergy] Anaphylaxis    Per patient "shrimp only"  . Sulfa Antibiotics Nausea And Vomiting  . Azactam [Aztreonam]     Hand swelling   . Ciprofloxacin Other (See Comments)    dizziness  . Zantac [Ranitidine Hcl]   . Claritin-D 12 Hour [Loratadine-Pseudoephedrine Er] Anxiety    Current Medications:   Current Outpatient Medications:  .  acetaminophen (TYLENOL) 500 MG tablet, Take 500 mg by mouth every 6 (six) hours as needed., Disp: , Rfl:  .  atorvastatin (LIPITOR) 20 MG tablet, TAKE 1 TABLET BY MOUTH AT BEDTIME AS NEEDED, Disp: 90 tablet, Rfl: 1 .  Biotin (BIOTIN MAXIMUM STRENGTH) 10 MG TABS, Take 10 mg by mouth daily., Disp: , Rfl:  .  Cholecalciferol (VITAMIN D3) 5000 units CAPS, Take 5,000 Units by mouth daily., Disp: , Rfl:  .  CRANBERRY PO, Take 1 capsule by mouth daily., Disp: , Rfl:  .  diclofenac sodium (VOLTAREN) 1 % GEL, APPLY 3 GRAMS TO 3 LARGEST JOINTS THREE TIMES DAILY AS NEEDED, Disp: 300 g, Rfl: 5 .  fexofenadine (ALLEGRA) 180 MG tablet, Take 180 mg by mouth daily., Disp: , Rfl:  .  fluticasone (FLONASE) 50 MCG/ACT nasal spray, USE 2 SPRAY(S) IN EACH NOSTRIL ONCE DAILY, Disp: 16 g, Rfl: 0 .  furosemide (LASIX) 40 MG tablet, Take 40 mg by mouth daily., Disp: , Rfl:  .  hydroxychloroquine (PLAQUENIL) 200 MG tablet, TAKE 1 TABLET BY  MOUTH ONCE DAILY, Disp: 90 tablet, Rfl: 0 .  ketoconazole (NIZORAL) 2 % cream, APPLY  CREAM TOPICALLY TO AFFECTED AREA ONCE DAILY, Disp: 30 g, Rfl: 0 .  pantoprazole (PROTONIX) 40 MG tablet, Take 1 tablet (40 mg total) by mouth daily., Disp: 30 tablet, Rfl: 3 .  potassium chloride (K-DUR) 10 MEQ tablet, TAKE 1 TABLET BY MOUTH TWICE DAILY, Disp: 60 tablet, Rfl: 0 .  predniSONE (DELTASONE) 1 MG tablet, Take 2 tablets (2 mg total) by  mouth daily with lunch. Take as directed on tapering dose, Disp: 60 tablet, Rfl: 1 .  predniSONE (DELTASONE) 5 MG tablet, 1 tablet at breakfast and taper as instructed (Patient taking differently: Take 5 mg by mouth daily with breakfast. 1 tablet at breakfast and taper as instructed), Disp: 30 tablet, Rfl: 3 .  traMADol (ULTRAM) 50 MG tablet, TAKE 1 TABLET BY MOUTH THREE TIMES DAILY AS NEEDED, Disp: 90 tablet, Rfl: 0 .  cephALEXin (KEFLEX) 500 MG capsule, Take 1 capsule (500 mg total) by mouth 3 (three) times daily for 5 days., Disp: 15 capsule, Rfl: 0   Review of Systems:   Review of Systems  Constitutional: Negative for chills.  Gastrointestinal: Negative for nausea and vomiting.  Genitourinary: Positive for dysuria, frequency and urgency. Negative for hematuria.    Vitals:   Vitals:   06/26/18 1050  BP: 120/68  Pulse: 76  Temp: 97.8 F (36.6 C)  TempSrc: Oral  SpO2: 96%  Weight: 163 lb (73.9 kg)  Height: 5' 4.25" (1.632 m)     Body mass index is 27.76 kg/m.  Physical Exam:   Physical Exam  Constitutional: She appears well-developed. She is cooperative.  Non-toxic appearance. She does not have a sickly appearance. She does not appear ill. No distress.  Cardiovascular: Normal rate, regular rhythm, S1 normal, S2 normal, normal heart sounds and normal pulses.  No LE edema  Pulmonary/Chest: Effort normal and breath sounds normal.  Abdominal: Normal appearance and bowel sounds are normal. There is no tenderness. There is no rigidity, no rebound, no  guarding and no CVA tenderness.  Neurological: She is alert. GCS eye subscore is 4. GCS verbal subscore is 5. GCS motor subscore is 6.  Skin: Skin is warm, dry and intact.  Psychiatric: She has a normal mood and affect. Her speech is normal and behavior is normal.  Nursing note and vitals reviewed.  Results for orders placed or performed in visit on 06/26/18  POCT urinalysis dipstick  Result Value Ref Range   Color, UA Yellow    Clarity, UA Hazy    Glucose, UA Negative Negative   Bilirubin, UA Negative    Ketones, UA Negative    Spec Grav, UA 1.015 1.010 - 1.025   Blood, UA Trace    pH, UA 6.0 5.0 - 8.0   Protein, UA Negative Negative   Urobilinogen, UA 0.2 0.2 or 1.0 E.U./dL   Nitrite, UA Negative    Leukocytes, UA Small (1+) (A) Negative   Appearance     Odor       Assessment and Plan:    Elynn was seen today for dysuria and urinary frequency.  Diagnoses and all orders for this visit:  Dysuria -     POCT urinalysis dipstick -     Urine Culture  Frequency of urination -     POCT urinalysis dipstick -     Urine Culture  Other orders -     cephALEXin (KEFLEX) 500 MG capsule; Take 1 capsule (500 mg total) by mouth 3 (three) times daily for 5 days.   We will send her urine for culture.  She does have a significant history of urosepsis so there is a low threshold to treat.  I did obtain verbal confirmation that she can tolerate Keflex, despite her allergy list.  We will call her to let her know her urine culture results.  Follow-up if symptoms worsen or persist despite treatment or any other concerns.  Push fluids. Patient is agreeable  to plan.  . Reviewed expectations re: course of current medical issues. . Discussed self-management of symptoms. . Outlined signs and symptoms indicating need for more acute intervention. . Patient verbalized understanding and all questions were answered. . See orders for this visit as documented in the electronic medical  record. . Patient received an After-Visit Summary.  CMA or LPN served as scribe during this visit. History, Physical, and Plan performed by medical provider. Documentation and orders reviewed and attested to.   Inda Coke, PA-C

## 2018-06-26 NOTE — Progress Notes (Signed)
Per orders of Dr.Walace, injection of b-12 given by Francella Solian. Patient tolerated injection well. Injection given in right deltoid.

## 2018-06-27 ENCOUNTER — Other Ambulatory Visit: Payer: Self-pay | Admitting: Physician Assistant

## 2018-06-27 LAB — URINE CULTURE
MICRO NUMBER:: 90846845
SPECIMEN QUALITY:: ADEQUATE

## 2018-06-27 MED ORDER — PREDNISONE 1 MG PO TABS: 1.0000 mg | ORAL_TABLET | Freq: Every day | ORAL | 2 refills | Status: DC

## 2018-07-09 ENCOUNTER — Encounter: Payer: Self-pay | Admitting: Family Medicine

## 2018-07-09 ENCOUNTER — Ambulatory Visit: Payer: Medicare Other | Admitting: Family Medicine

## 2018-07-09 ENCOUNTER — Ambulatory Visit (INDEPENDENT_AMBULATORY_CARE_PROVIDER_SITE_OTHER): Payer: Medicare Other

## 2018-07-09 VITALS — BP 126/80 | HR 75 | Temp 98.0°F | Ht 64.25 in | Wt 163.6 lb

## 2018-07-09 VITALS — BP 126/80 | HR 75 | Temp 98.0°F | Ht 64.25 in | Wt 163.0 lb

## 2018-07-09 DIAGNOSIS — E538 Deficiency of other specified B group vitamins: Secondary | ICD-10-CM

## 2018-07-09 DIAGNOSIS — H04123 Dry eye syndrome of bilateral lacrimal glands: Secondary | ICD-10-CM | POA: Diagnosis not present

## 2018-07-09 DIAGNOSIS — R300 Dysuria: Secondary | ICD-10-CM

## 2018-07-09 DIAGNOSIS — Z13220 Encounter for screening for lipoid disorders: Secondary | ICD-10-CM | POA: Diagnosis not present

## 2018-07-09 DIAGNOSIS — H1013 Acute atopic conjunctivitis, bilateral: Secondary | ICD-10-CM

## 2018-07-09 DIAGNOSIS — L03115 Cellulitis of right lower limb: Secondary | ICD-10-CM | POA: Diagnosis not present

## 2018-07-09 DIAGNOSIS — Z0000 Encounter for general adult medical examination without abnormal findings: Secondary | ICD-10-CM

## 2018-07-09 DIAGNOSIS — J309 Allergic rhinitis, unspecified: Secondary | ICD-10-CM

## 2018-07-09 LAB — LIPID PANEL
Cholesterol: 140 mg/dL (ref 0–200)
HDL: 68.7 mg/dL (ref >39.00)
LDL Cholesterol: 40 mg/dL (ref 0–99)
NonHDL: 71.74
Total CHOL/HDL Ratio: 2
Triglycerides: 157 mg/dL — ABNORMAL HIGH (ref 0.0–149.0)
VLDL: 31.4 mg/dL (ref 0.0–40.0)

## 2018-07-09 LAB — VITAMIN B12: Vitamin B-12: 407 pg/mL (ref 211–911)

## 2018-07-09 MED ORDER — CEPHALEXIN 500 MG PO CAPS: 500.0000 mg | ORAL_CAPSULE | Freq: Four times a day (QID) | ORAL | 0 refills | Status: DC

## 2018-07-09 MED ORDER — MONTELUKAST SODIUM 10 MG PO TABS: 10.0000 mg | ORAL_TABLET | Freq: Every day | ORAL | 3 refills | Status: DC

## 2018-07-09 NOTE — Progress Notes (Signed)
PCP notes:see OV note from 07/09/18   Health maintenance:Up to Date   Abnormal screenings: None   Patient concerns: Patient wondering about cholesterol and B12. Levels were drawn today to be assessed   Nurse concerns:None   Next PCP appt: Follow Up as Needed

## 2018-07-09 NOTE — Progress Notes (Signed)
Subjective:   Eileen Wilkerson is a 84 y.o. female who presents for Medicare Annual (Subsequent) preventive examination.  Review of Systems:  No ROS.  Medicare Wellness Visit. Additional risk factors are reflected in the social history. Cardiac Risk Factors include: advanced age (>51men, >19 women)   Patient currently lives in a single story apartment alone as she is recently widowed (October). She moved in to [facility] No pets. Has not started participating in activities offered at [facility] yet.  Likes to play computer games: Estate manager/land agent, WordScape, Free Cell. Interested in photography and enjoys taking pictures of her family. Recently traveled to the beach with her oldest son and his family. Enjoys sewing.  Sleeps about 8 hours a night. Takes melatonin. Feels rested when she wakes up. Objective:     Vitals: BP 126/80   Pulse 75   Temp 98 F (36.7 C)   Ht 5' 4.25" (1.632 m)   Wt 163 lb (73.9 kg)   SpO2 94% Comment: All vitals from visit today with Dr. Juleen China  BMI 27.76 kg/m   Body mass index is 27.76 kg/m.  Advanced Directives 07/09/2018 02/03/2018 01/18/2018 04/25/2017 12/28/2015  Does Patient Have a Medical Advance Directive? Yes No No Yes Yes  Type of Paramedic of [place];Living will - - [place];Living will [place];Living will  Does patient want to make changes to medical advance directive? No - Patient declined - - No - Patient declined No - Patient declined  Copy of [place] in Chart? No - copy requested - - No - copy requested Yes  Would patient like information on creating a medical advance directive? - No - Patient declined No - Patient declined - -    Tobacco Social History   Tobacco Use  Smoking Status Former Smoker  . Packs/day: 0.75  . Years: 10.00  . Pack years: 7.50  . Types: Cigarettes  . Last attempt to quit: 1968  . Years since quitting: 51.6  Smokeless  Tobacco Never Used     Counseling given: Not Answered       Past Medical History:  Diagnosis Date  . Adrenal failure ([place])   . Arthritis   . Cataract   . Closed nondisplaced fracture of fifth right metatarsal bone 09/18/2017  . Fibromyalgia 2008  . HCAP (healthcare-associated pneumonia) 02/03/2018  . Osteoporosis   . RA (rheumatoid arthritis) ([place])   . Sepsis due to urinary tract infection ([place]) 01/18/2018   Past Surgical History:  Procedure Laterality Date  . BACK SURGERY    . BILATERAL CARPAL TUNNEL RELEASE    . CATARACT EXTRACTION, BILATERAL  2004  . CERVICAL FUSION  2011,2010,2008  . HEEL SPUR SURGERY  2004  . ROTATOR CUFF REPAIR    . TONSILLECTOMY AND ADENOIDECTOMY  1947  . TOTAL SHOULDER ARTHROPLASTY     Family History  Problem Relation Age of Onset  . Heart attack Maternal Grandmother   . Heart attack Paternal Grandfather   . Breast cancer Mother   . Diabetes Father   . Heart disease Father    Social History   Socioeconomic History  . Marital status: Widowed    Spouse name: Not on file  . Number of children: Not on file  . Years of education: Not on file  . Highest education level: Not on file  Occupational History  . Not on file  Social Needs  . Financial resource strain: Not on file  .  Food insecurity:    Worry: Not on file    Inability: Not on file  . Transportation needs:    Medical: Not on file    Non-medical: Not on file  Tobacco Use  . Smoking status: Former Smoker    Packs/day: 0.75    Years: 10.00    Pack years: 7.50    Types: Cigarettes    Last attempt to quit: 1968    Years since quitting: 51.6  . Smokeless tobacco: Never Used  Substance and Sexual Activity  . Alcohol use: No  . Drug use: No  . Sexual activity: Not Currently  Lifestyle  . Physical activity:    Days per week: Not on file    Minutes per session: Not on file  . Stress: Not on file  Relationships  . Social connections:    Talks on phone: Not on file    Gets  together: Not on file    Attends religious service: Not on file    Active member of club or organization: Not on file    Attends meetings of clubs or organizations: Not on file    Relationship status: Not on file  Other Topics Concern  . Not on file  Social History Narrative  . Not on file    Outpatient Encounter Medications as of 07/09/2018  Medication Sig  . acetaminophen (TYLENOL) 500 MG tablet Take 500 mg by mouth every 6 (six) hours as needed.  Marland Kitchen atorvastatin (LIPITOR) 20 MG tablet TAKE 1 TABLET BY MOUTH AT BEDTIME AS NEEDED  . Biotin (BIOTIN MAXIMUM STRENGTH) 10 MG TABS Take 10 mg by mouth daily.  . cephALEXin (KEFLEX) 500 MG capsule Take 1 capsule (500 mg total) by mouth 4 (four) times daily.  . Cholecalciferol (VITAMIN D3) 5000 units CAPS Take 5,000 Units by mouth daily.  Marland Kitchen CRANBERRY PO Take 1 capsule by mouth daily.  . diclofenac sodium (VOLTAREN) 1 % GEL APPLY 3 GRAMS TO 3 LARGEST JOINTS THREE TIMES DAILY AS NEEDED  . fexofenadine (ALLEGRA) 180 MG tablet Take 180 mg by mouth daily.  . fluticasone (FLONASE) 50 MCG/ACT nasal spray USE 2 SPRAY(S) IN EACH NOSTRIL ONCE DAILY  . furosemide (LASIX) 40 MG tablet Take 40 mg by mouth daily.  . hydroxychloroquine (PLAQUENIL) 200 MG tablet TAKE 1 TABLET BY MOUTH ONCE DAILY  . ketoconazole (NIZORAL) 2 % cream APPLY  CREAM TOPICALLY TO AFFECTED AREA ONCE DAILY  . montelukast (SINGULAIR) 10 MG tablet Take 1 tablet (10 mg total) by mouth at bedtime.  . pantoprazole (PROTONIX) 40 MG tablet Take 1 tablet (40 mg total) by mouth daily.  . potassium chloride (K-DUR) 10 MEQ tablet TAKE 1 TABLET BY MOUTH TWICE DAILY  . predniSONE (DELTASONE) 1 MG tablet Take 2 tablets (2 mg total) by mouth daily with lunch. Take as directed on tapering dose  . predniSONE (DELTASONE) 5 MG tablet 1 tablet at breakfast and taper as instructed (Patient taking differently: Take 5 mg by mouth daily with breakfast. 1 tablet at breakfast and taper as instructed)  . traMADol  (ULTRAM) 50 MG tablet TAKE 1 TABLET BY MOUTH THREE TIMES DAILY AS NEEDED   No facility-administered encounter medications on file as of 07/09/2018.     Activities of Daily Living In your present state of health, do you have any difficulty performing the following activities: 07/09/2018 02/03/2018  Hearing? Tempie Donning  Comment Wears hearing aids bilaterally -  Vision? N N  Difficulty concentrating or making decisions? N Y  Walking  or climbing stairs? Y Y  Comment Uses a cane to walk -  Dressing or bathing? N Y  Doing errands, shopping? N Y  Conservation officer, nature and eating ? N -  Using the Toilet? N -  In the past six months, have you accidently leaked urine? Y -  Comment Saw Dr. Juleen China this morning to discuss -  Do you have problems with loss of bowel control? N -  Managing your Medications? N -  Managing your Finances? N -  Housekeeping or managing your Housekeeping? N -  Some recent data might be hidden    Patient Care Team: Briscoe Deutscher, DO as PCP - General (Family Medicine)    Assessment:   This is a routine wellness examination for [place].  Exercise Activities and Dietary recommendations Current Exercise Habits: Home exercise routine(Patient about to start PT), Type of exercise: walking, Time (Minutes): 20, Frequency (Times/Week): 7, Weekly Exercise (Minutes/Week): 140, Intensity: Mild, Exercise limited by: orthopedic condition(s)   Breakfast: Fruit salad, graham crackers, Coffee-decaffinated (black) Lunch: salad with Diet Pepsi Dinner: lasagna and a salad  Prefers salty over sweet Drinks about 8 glasses of water a day Goals    . Increase physical activity     Working with physical therapist to continue to improve strength. Patient would like to be able to ambulate without using the cane       Fall Risk Fall Risk  07/09/2018 07/09/2018 06/26/2018 05/08/2017  Falls in the past year? Yes No Yes No  Number falls in past yr: 2 or more - 1 -  Comment Fell backwards ona  3 step  stool - - -  Injury with Fall? Yes - Yes -  Comment Fractured Ankle in 1st fall - Pt said she landed on her bottom and was sore for a few days. -  Risk Factor Category  - - High Fall Risk -  Risk for fall due to : Other (Comment) - Impaired balance/gait -  Risk for fall due to: Comment - - Pt uses a cane -  Follow up - - Falls prevention discussed -   Depression Screen PHQ 2/9 Scores 07/09/2018 07/09/2018 05/08/2017  PHQ - 2 Score 0 0 0  PHQ- 9 Score - 0 -     Cognitive Function     6CIT Screen 07/09/2018  What Year? 0 points  What month? 0 points  What time? 0 points  Count back from 20 0 points  Months in reverse 0 points    Immunization History  Administered Date(s) Administered  . Influenza,inj,Quad PF,6+ Mos 08/11/2016  . Influenza-Unspecified 11/12/2017  . PPD Test 03/01/2018  . Pneumococcal Conjugate-13 06/26/2017     Screening Tests Health Maintenance  Topic Date Due  . PNA vac Low Risk Adult (2 of 2 - PPSV23) 12/27/2018 (Originally 06/26/2018)  . TETANUS/TDAP  06/27/2019 (Originally 09/07/1951)  . INFLUENZA VACCINE  07/11/2018  . DEXA SCAN  Completed        Plan:    Follow Up with PCP as advised   I have personally reviewed and noted the following in the patient's chart:   . Medical and social history . Use of alcohol, tobacco or illicit drugs  . Current medications and supplements . Functional ability and status . Nutritional status . Physical activity . Advanced directives . List of other physicians . Vitals . Screenings to include cognitive, depression, and falls . Referrals and appointments  In addition, I have reviewed and discussed with patient certain preventive protocols, quality  metrics, and best practice recommendations. A written personalized care plan for preventive services as well as general preventive health recommendations were provided to patient.     [place], [place]  01/09/2908

## 2018-07-09 NOTE — Patient Instructions (Signed)
Eileen Wilkerson , Thank you for taking time to come for your Medicare Wellness Visit. I appreciate your ongoing commitment to your health goals. Please review the following plan we discussed and let me know if I can assist you in the future.   These are the goals we discussed: Goals    . Increase physical activity     Working with physical therapist to continue to improve strength. Patient would like to be able to ambulate without using the cane       This is a list of the screening recommended for you and due dates:  Health Maintenance  Topic Date Due  . Pneumonia vaccines (2 of 2 - PPSV23) 12/27/2018*  . Tetanus Vaccine  06/27/2019*  . Flu Shot  07/11/2018  . DEXA scan (bone density measurement)  Completed  *Topic was postponed. The date shown is not the original due date.    Preventive Care for Adults  A healthy lifestyle and preventive care can promote health and wellness. Preventive health guidelines for adults include the following key practices.  . A routine yearly physical is a good way to check with your health care provider about your health and preventive screening. It is a chance to share any concerns and updates on your health and to receive a thorough exam.  . Visit your dentist for a routine exam and preventive care every 6 months. Brush your teeth twice a day and floss once a day. Good oral hygiene prevents tooth decay and gum disease.  . The frequency of eye exams is based on your age, health, family medical history, use  of contact lenses, and other factors. Follow your health care provider's recommendations for frequency of eye exams.  . Eat a healthy diet. Foods like vegetables, fruits, whole grains, low-fat dairy products, and lean protein foods contain the nutrients you need without too many calories. Decrease your intake of foods high in solid fats, added sugars, and salt. Eat the right amount of calories for you. Get information about a proper diet from your health  care provider, if necessary.  . Regular physical exercise is one of the most important things you can do for your health. Most adults should get at least 150 minutes of moderate-intensity exercise (any activity that increases your heart rate and causes you to sweat) each week. In addition, most adults need muscle-strengthening exercises on 2 or more days a week.  Silver Sneakers may be a benefit available to you. To determine eligibility, you may visit the website: www.silversneakers.com or contact program at (319) 140-9440 Mon-Fri between 8AM-8PM.   . Maintain a healthy weight. The body mass index (BMI) is a screening tool to identify possible weight problems. It provides an estimate of body fat based on height and weight. Your health care provider can find your BMI and can help you achieve or maintain a healthy weight.   For adults 20 years and older: ? A BMI below 18.5 is considered underweight. ? A BMI of 18.5 to 24.9 is normal. ? A BMI of 25 to 29.9 is considered overweight. ? A BMI of 30 and above is considered obese.   . Maintain normal blood lipids and cholesterol levels by exercising and minimizing your intake of saturated fat. Eat a balanced diet with plenty of fruit and vegetables. Blood tests for lipids and cholesterol should begin at age 66 and be repeated every 5 years. If your lipid or cholesterol levels are high, you are over 50, or you are at  high risk for heart disease, you may need your cholesterol levels checked more frequently. Ongoing high lipid and cholesterol levels should be treated with medicines if diet and exercise are not working.  . If you smoke, find out from your health care provider how to quit. If you do not use tobacco, please do not start.  . If you choose to drink alcohol, please do not consume more than 2 drinks per day. One drink is considered to be 12 ounces (355 mL) of beer, 5 ounces (148 mL) of wine, or 1.5 ounces (44 mL) of liquor.  . If you are 53-36  years old, ask your health care provider if you should take aspirin to prevent strokes.  . Use sunscreen. Apply sunscreen liberally and repeatedly throughout the day. You should seek shade when your shadow is shorter than you. Protect yourself by wearing long sleeves, pants, a wide-brimmed hat, and sunglasses year round, whenever you are outdoors.  . Once a month, do a whole body skin exam, using a mirror to look at the skin on your back. Tell your health care provider of new moles, moles that have irregular borders, moles that are larger than a pencil eraser, or moles that have changed in shape or color.

## 2018-07-09 NOTE — Progress Notes (Signed)
Eileen Wilkerson is a 84 y.o. female is here for follow up.  History of Present Illness:   [name], CMA acting as scribe for Dr. Briscoe Deutscher.   HPI: Patient in office for follow up. She was seen in office on 06/26/18 and had urine culture done. She did complete the antibiotic but she still having burning at night when she is in the bed. No problems with urination. She is not having any change in color and odor.   She has been having ear pain right grater then left for over a month. She feels like pain is in her teeth at times. She has been having increased  congestion and face selling.   Patient has been on B-12 injections monthly for few months and she would like to have b-12 checked to see if she can go on oral medication.   Health Maintenance Due  Topic Date Due  . INFLUENZA VACCINE  07/11/2018   Depression screen [facility] 2/9 07/09/2018 07/09/2018 05/08/2017  Decreased Interest 0 0 0  Down, Depressed, Hopeless 0 0 0  PHQ - 2 Score 0 0 0  Altered sleeping - 0 -  Tired, decreased energy - 0 -  Change in appetite - 0 -  Feeling bad or failure about yourself  - 0 -  Trouble concentrating - 0 -  Moving slowly or fidgety/restless - 0 -  Suicidal thoughts - 0 -  PHQ-9 Score - 0 -  Difficult doing work/chores - Not difficult at all -   PMHx, SurgHx, SocialHx, FamHx, Medications, and Allergies were reviewed in the Visit Navigator and updated as appropriate.   Patient Active Problem List   Diagnosis Date Noted  . B12 deficiency 03/11/2018  . AKI (acute kidney injury) ([place]) 02/14/2018  . 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 Bilateral lower extremity edema 06/30/2017  . Physical deconditioning 05/30/2017  . Immunosuppressed status ([place]) 05/30/2017  . Abnormal chest x-ray 05/17/2017  . Hypokalemia 04/25/2017  . Fibromyalgia 12/18/2016  . DJD (degenerative joint disease), cervical 12/18/2016  .  Spondylosis of lumbar region without myelopathy or radiculopathy 12/18/2016  . Trigger finger, right middle finger 12/18/2016  . Primary osteoarthritis of both feet 12/18/2016  . Primary osteoarthritis of both knees 12/18/2016  . GERD (gastroesophageal reflux disease) 12/18/2016  . Age-related osteoporosis without current pathological fracture 12/18/2016  . Rheumatoid arthritis ([place]) 12/28/2015  . Long term current use of systemic steroids, for RA 12/28/2015  . Lymphocytosis 11/13/2012   Social History   Tobacco Use  . Smoking status: Former Smoker    Packs/day: 0.75    Years: 10.00    Pack years: 7.50    Types: Cigarettes    Last attempt to quit: 1968    Years since quitting: 51.6  . Smokeless tobacco: Never Used  Substance Use Topics  . Alcohol use: No  . Drug use: No   Current Medications and Allergies:   Current Outpatient Medications:  .  acetaminophen (TYLENOL) 500 MG tablet, Take 500 mg by mouth every 6 (six) hours as needed., Disp: , Rfl:  .  atorvastatin (LIPITOR) 20 MG tablet, TAKE 1 TABLET BY MOUTH AT BEDTIME AS NEEDED, Disp: 90 tablet, Rfl: 1 .  Biotin (BIOTIN MAXIMUM STRENGTH) 10 MG TABS, Take 10 mg by mouth daily., Disp: , Rfl:  .  Cholecalciferol (VITAMIN D3) 5000 units CAPS, Take 5,000 Units by mouth daily., Disp: , Rfl:  .  CRANBERRY PO,  Take 1 capsule by mouth daily., Disp: , Rfl:  .  diclofenac sodium (VOLTAREN) 1 % GEL, APPLY 3 GRAMS TO 3 LARGEST JOINTS THREE TIMES DAILY AS NEEDED, Disp: 300 g, Rfl: 5 .  fexofenadine (ALLEGRA) 180 MG tablet, Take 180 mg by mouth daily., Disp: , Rfl:  .  fluticasone (FLONASE) 50 MCG/ACT nasal spray, USE 2 SPRAY(S) IN EACH NOSTRIL ONCE DAILY, Disp: 16 g, Rfl: 0 .  furosemide (LASIX) 40 MG tablet, Take 40 mg by mouth daily., Disp: , Rfl:  .  hydroxychloroquine (PLAQUENIL) 200 MG tablet, TAKE 1 TABLET BY MOUTH ONCE DAILY, Disp: 90 tablet, Rfl: 0 .  ketoconazole (NIZORAL) 2 % cream, APPLY  CREAM TOPICALLY TO AFFECTED AREA ONCE  DAILY, Disp: 30 g, Rfl: 0 .  pantoprazole (PROTONIX) 40 MG tablet, Take 1 tablet (40 mg total) by mouth daily., Disp: 30 tablet, Rfl: 3 .  potassium chloride (K-DUR) 10 MEQ tablet, TAKE 1 TABLET BY MOUTH TWICE DAILY, Disp: 60 tablet, Rfl: 0 .  predniSONE (DELTASONE) 1 MG tablet, Take 2 tablets (2 mg total) by mouth daily with lunch. Take as directed on tapering dose, Disp: 60 tablet, Rfl: 1 .  predniSONE (DELTASONE) 1 MG tablet, Take 1 tablet (1 mg total) by mouth daily., Disp: 60 tablet, Rfl: 2 .  predniSONE (DELTASONE) 5 MG tablet, 1 tablet at breakfast and taper as instructed (Patient taking differently: Take 5 mg by mouth daily with breakfast. 1 tablet at breakfast and taper as instructed), Disp: 30 tablet, Rfl: 3 .  traMADol (ULTRAM) 50 MG tablet, TAKE 1 TABLET BY MOUTH THREE TIMES DAILY AS NEEDED, Disp: 90 tablet, Rfl: 0   Allergies  Allergen Reactions  . Adhesive [Tape] Rash  . Celebrex [Celecoxib] Hives  . Ciprofibrate Nausea Only  . Cymbalta [Duloxetine Hcl] Swelling  . Gabitril [Tiagabine] Swelling  . Keflex [Cephalexin] Nausea And Vomiting  . Lyrica [Pregabalin] Swelling  . Neurontin [Gabapentin] Swelling  . Nexium [Esomeprazole] Rash  . Nsaids Rash  . Penicillins Rash    Injection site reaction. Tolerated cefepime in past Has patient had a PCN reaction causing immediate rash, facial/tongue/throat swelling, SOB or lightheadedness with hypotension: No Has patient had a PCN reaction causing severe rash involving mucus membranes or skin necrosis: No Has patient had a PCN reaction that required hospitalization No Has patient had a PCN reaction occurring within the last 10 years: No If all of the above answers are "NO", then may proceed with Cephalosporin use.   . Shrimp [Shellfish Allergy] Anaphylaxis    Per patient "shrimp only"  . Sulfa Antibiotics Nausea And Vomiting  . Azactam [Aztreonam]     Hand swelling   . Ciprofloxacin Other (See Comments)    dizziness  . Zantac  [Ranitidine Hcl]   . Claritin-D 12 Hour [Loratadine-Pseudoephedrine Er] Anxiety   Review of Systems   Pertinent items are noted in the HPI. Otherwise, ROS is negative.  Vitals:   Vitals:   07/09/18 1022  BP: 126/80  Pulse: 75  Temp: 98 F (36.7 C)  TempSrc: Oral  SpO2: 94%  Weight: 163 lb 9.6 oz (74.2 kg)  Height: 5' 4.25" (1.632 m)     Body mass index is 27.86 kg/m.  Physical Exam:   Physical Exam  Constitutional: She appears well-nourished.  HENT:  Head: Normocephalic and atraumatic.  Eyes: Pupils are equal, round, and reactive to light. EOM are normal.  Neck: Normal range of motion. Neck supple.  Cardiovascular: Normal rate, regular rhythm, normal heart sounds  and intact distal pulses.  Pulmonary/Chest: Effort normal.  Abdominal: Soft.  Skin: Skin is warm.  Psychiatric: She has a normal mood and affect. Her behavior is normal.  Nursing note and vitals reviewed.  Results for orders placed or performed in visit on 07/09/18  Urine Culture  Result Value Ref Range   MICRO NUMBER: [id number]    SPECIMEN QUALITY: ADEQUATE    Sample Source URINE    STATUS: PRELIMINARY    ISOLATE 1: Gram negative bacilli isolated (A)   Lipid panel  Result Value Ref Range   Cholesterol 140 0 - 200 mg/dL   Triglycerides 157.0 (H) 0.0 - 149.0 mg/dL   HDL 68.70 >39.00 mg/dL   VLDL 31.4 0.0 - 40.0 mg/dL   LDL Cholesterol 40 0 - 99 mg/dL   Total CHOL/HDL Ratio 2    NonHDL 71.74   Vitamin B12  Result Value Ref Range   Vitamin B-12 407 211 - 911 pg/mL    Assessment and Plan:   Cynthia was seen today for follow-up.  Diagnoses and all orders for this visit:  Cellulitis of leg, right -     Cancel: Urinalysis, Routine w reflex microscopic -     cephALEXin (KEFLEX) 500 MG capsule; Take 1 capsule (500 mg total) by mouth 4 (four) times daily.  Dry eyes, bilateral -     Cancel: Urinalysis, Routine w reflex microscopic  Dysuria -     cephALEXin (KEFLEX) 500 MG capsule; Take 1  capsule (500 mg total) by mouth 4 (four) times daily. -     Urine Culture  Screening for lipid disorders -     Lipid panel  B12 deficiency -     Vitamin B12  Allergic conjunctivitis and rhinitis, bilateral -     montelukast (SINGULAIR) 10 MG tablet; Take 1 tablet (10 mg total) by mouth at bedtime. -     Ambulatory referral to Allergy    . Reviewed expectations re: course of current medical issues. . Discussed self-management of symptoms. . Outlined signs and symptoms indicating need for more acute intervention. . Patient verbalized understanding and all questions were answered. Marland Kitchen Health Maintenance issues including appropriate healthy diet, exercise, and smoking avoidance were discussed with patient. . See orders for this visit as documented in the electronic medical record. . Patient received an After Visit Summary.  Briscoe Deutscher, DO [place], [place] 07/11/2018  Future Appointments  Date Time Provider [place]  08/15/2018  1:30 PM Bo Merino, MD [facility] None  07/22/2019 10:00 AM [facility] HEALTH COACH [facility]   CMA served as Education administrator during this visit. History, Physical, and Plan performed by medical provider. The above documentation has been reviewed and is accurate and complete. Briscoe Deutscher, D.O.

## 2018-07-09 NOTE — Progress Notes (Signed)
I have personally reviewed the Medicare Annual Wellness questionnaire and have noted 1. The patient's medical and social history 2. Their use of alcohol, tobacco or illicit drugs 3. Their current medications and supplements 4. The patient's functional ability including ADL's, fall risks, home safety risks and hearing or visual impairment. 5. Diet and physical activities 6. Evidence for depression or mood disorders 7. Reviewed Updated provider list, see scanned forms and [facility] Snapshot.   The patients weight, height, BMI and visual acuity have been recorded in the chart I have made referrals, counseling and provided education to the patient based review of the above and I have provided the pt with a written personalized care plan for preventive services.  I have provided the patient with a copy of your personalized plan for preventive services. Instructed to take the time to review along with their updated medication list.  Remington Skalsky, DO  

## 2018-07-11 ENCOUNTER — Encounter: Payer: Self-pay | Admitting: Family Medicine

## 2018-07-11 LAB — URINE CULTURE
MICRO NUMBER:: 90899405
SPECIMEN QUALITY:: ADEQUATE

## 2018-07-12 ENCOUNTER — Other Ambulatory Visit: Payer: Self-pay | Admitting: Family Medicine

## 2018-07-14 ENCOUNTER — Other Ambulatory Visit: Payer: Self-pay | Admitting: Family Medicine

## 2018-07-15 MED ORDER — NITROFURANTOIN MONOHYD MACRO 100 MG PO CAPS
100.0000 mg | ORAL_CAPSULE | Freq: Two times a day (BID) | ORAL | 0 refills | Status: DC
Start: 2018-07-15 — End: 2018-08-08

## 2018-07-15 NOTE — Telephone Encounter (Signed)
Please advise 

## 2018-07-15 NOTE — Addendum Note (Signed)
Addended by: Briscoe Deutscher R on: 07/15/2018 03:18 PM   Modules accepted: Orders

## 2018-07-18 ENCOUNTER — Encounter: Payer: Self-pay | Admitting: Family Medicine

## 2018-07-18 ENCOUNTER — Encounter: Payer: Self-pay | Admitting: Rheumatology

## 2018-07-19 ENCOUNTER — Other Ambulatory Visit: Payer: Self-pay | Admitting: Rheumatology

## 2018-07-19 ENCOUNTER — Other Ambulatory Visit: Payer: Self-pay | Admitting: Family Medicine

## 2018-07-19 DIAGNOSIS — K219 Gastro-esophageal reflux disease without esophagitis: Secondary | ICD-10-CM

## 2018-07-19 DIAGNOSIS — Z79899 Other long term (current) drug therapy: Secondary | ICD-10-CM

## 2018-07-19 MED ORDER — PREDNISONE 5 MG PO TABS: ORAL_TABLET | ORAL | 1 refills | Status: DC

## 2018-07-19 NOTE — Telephone Encounter (Signed)
Last visit: 05/16/2018  Next visit: 08/15/2018 Labs: 03/01/2018 WBC 15.7 Creat. 1.23 previously 1.26 (On prednisone) Eye exam: 04/16/2018  Attempted to call patient and left message on machine to advise patient she is due to update labs this month. Lab orders have been placed.   Okay to refill plaquenil?

## 2018-07-19 NOTE — Telephone Encounter (Signed)
Ok to refill 

## 2018-07-30 ENCOUNTER — Ambulatory Visit (INDEPENDENT_AMBULATORY_CARE_PROVIDER_SITE_OTHER): Payer: Medicare Other | Admitting: Surgical

## 2018-07-30 ENCOUNTER — Other Ambulatory Visit: Payer: Self-pay

## 2018-07-30 ENCOUNTER — Encounter: Payer: Self-pay | Admitting: Surgical

## 2018-07-30 DIAGNOSIS — E538 Deficiency of other specified B group vitamins: Secondary | ICD-10-CM

## 2018-07-30 DIAGNOSIS — Z79899 Other long term (current) drug therapy: Secondary | ICD-10-CM

## 2018-07-30 MED ORDER — CYANOCOBALAMIN 1000 MCG/ML IJ SOLN
1000.0000 ug | Freq: Once | INTRAMUSCULAR | Status: AC
  Administered 2018-07-30: 1000 ug via INTRAMUSCULAR

## 2018-07-30 NOTE — Progress Notes (Signed)
Patient comes in today for her monthly B 12 injection. B 12 given in left deltoid. Patient tolerated well.

## 2018-07-31 LAB — COMPLETE METABOLIC PANEL WITH GFR
AG Ratio: 1.6 (calc) (ref 1.0–2.5)
ALT: 14 U/L (ref 6–29)
AST: 18 U/L (ref 10–35)
Albumin: 4.2 g/dL (ref 3.6–5.1)
Alkaline phosphatase (APISO): 52 U/L (ref 33–130)
BUN/Creatinine Ratio: 18 (calc) (ref 6–22)
BUN: 21 mg/dL (ref 7–25)
CO2: 32 mmol/L (ref 20–32)
Calcium: 10.2 mg/dL (ref 8.6–10.4)
Chloride: 102 mmol/L (ref 98–110)
Creat: 1.17 mg/dL — ABNORMAL HIGH (ref 0.60–0.88)
GFR, Est African American: 50 mL/min/{1.73_m2} — ABNORMAL LOW (ref >=60)
GFR, Est Non African American: 43 mL/min/{1.73_m2} — ABNORMAL LOW (ref >=60)
Globulin: 2.7 g/dL (calc) (ref 1.9–3.7)
Glucose, Bld: 133 mg/dL — ABNORMAL HIGH (ref 65–99)
Potassium: 4.1 mmol/L (ref 3.5–5.3)
Sodium: 144 mmol/L (ref 135–146)
Total Bilirubin: 0.5 mg/dL (ref 0.2–1.2)
Total Protein: 6.9 g/dL (ref 6.1–8.1)

## 2018-07-31 LAB — CBC WITH DIFFERENTIAL/PLATELET
Basophils Absolute: 62 cells/uL (ref 0–200)
Basophils Relative: 0.7 %
Eosinophils Absolute: 9 cells/uL — ABNORMAL LOW (ref 15–500)
Eosinophils Relative: 0.1 %
HCT: 37.6 % (ref 35.0–45.0)
Hemoglobin: 12.3 g/dL (ref 11.7–15.5)
Lymphs Abs: 1371 cells/uL (ref 850–3900)
MCH: 31 pg (ref 27.0–33.0)
MCHC: 32.7 g/dL (ref 32.0–36.0)
MCV: 94.7 fL (ref 80.0–100.0)
MPV: 10.4 fL (ref 7.5–12.5)
Monocytes Relative: 3.9 %
Neutro Abs: 7111 cells/uL (ref 1500–7800)
Neutrophils Relative %: 79.9 %
Platelets: 283 10*3/uL (ref 140–400)
RBC: 3.97 10*6/uL (ref 3.80–5.10)
RDW: 11.2 % (ref 11.0–15.0)
Total Lymphocyte: 15.4 %
WBC mixed population: 347 cells/uL (ref 200–950)
WBC: 8.9 10*3/uL (ref 3.8–10.8)

## 2018-08-01 NOTE — Progress Notes (Signed)
Office Visit Note  Patient: Eileen Wilkerson             Date of Birth: May 19, 1932           MRN: [id number]             PCP: Briscoe Deutscher, DO Referring: Briscoe Deutscher, DO Visit Date: 08/15/2018 Occupation: @GUAROCC @  Subjective:  Pain in multiple joints  History of Present Illness: Eileen Wilkerson is a 84 y.o. female with history of rheumatoid arthritis and osteoarthritis overlap.  She also has underlying history of fibromyalgia and degenerative disc disease.  She states that she came off methotrexate in January due to frequent infections she has been taking Plaquenil 1 tablet a day.  She is a still on prednisone 7 mg p.o. daily due to adrenal insufficiency.  She has been experiencing increased joint pain all over.  She states her knee joints have been hurting a lot.  She had good results to Visco supplement injections in the past.  She would like to have another set of Visco supplement injections.  She is also going to physical therapy for osteoarthritis in her knee joints.  She believes her fibromyalgia is flaring as her skin has been quite tender.  Activities of Daily Living:  Patient reports morning stiffness for 2 hours.   Patient Denies nocturnal pain.  Difficulty dressing/grooming: Denies Difficulty climbing stairs: Denies Difficulty getting out of chair: Denies Difficulty using hands for taps, buttons, cutlery, and/or writing: Denies  Review of Systems  Constitutional: Positive for fatigue. Negative for night sweats, weight gain and weight loss.  HENT: Positive for mouth dryness. Negative for mouth sores, trouble swallowing, trouble swallowing and nose dryness.   Eyes: Positive for dryness. Negative for pain, redness and visual disturbance.  Respiratory: Negative for cough, shortness of breath and difficulty breathing.   Cardiovascular: Negative for chest pain, palpitations, hypertension, irregular heartbeat and swelling in legs/feet.  Gastrointestinal: Negative for  blood in stool, constipation and diarrhea.  Endocrine: Negative for increased urination.  Genitourinary: Negative for difficulty urinating and vaginal dryness.  Musculoskeletal: Positive for arthralgias, joint pain, myalgias, morning stiffness and myalgias. Negative for joint swelling, muscle weakness and muscle tenderness.  Skin: Positive for hair loss. Negative for color change, rash, skin tightness, ulcers and sensitivity to sunlight.  Allergic/Immunologic: Negative for susceptible to infections.  Neurological: Negative for dizziness, numbness, headaches, memory loss, night sweats and weakness.  Hematological: Negative for swollen glands.  Psychiatric/Behavioral: Positive for depressed mood and sleep disturbance. The patient is not nervous/anxious.     PMFS History:  Patient Active Problem List   Diagnosis Date Noted  . B12 deficiency 03/11/2018  . AKI (acute kidney injury) ([place]) 02/14/2018  . Idiopathic chronic venous hypertension of both lower extremities with ulcer and inflammation ([place]) 09/18/2017  . Bilateral lower extremity edema 06/30/2017  . Physical deconditioning 05/30/2017  . Immunosuppressed status ([place]) 05/30/2017  . Abnormal chest x-ray 05/17/2017  . Hypokalemia 04/25/2017  . Fibromyalgia 12/18/2016  . DJD (degenerative joint disease), cervical 12/18/2016  . Spondylosis of lumbar region without myelopathy or radiculopathy 12/18/2016  . Trigger finger, right middle finger 12/18/2016  . Primary osteoarthritis of both feet 12/18/2016  . Primary osteoarthritis of both knees 12/18/2016  . GERD (gastroesophageal reflux disease) 12/18/2016  . Age-related osteoporosis without current pathological fracture 12/18/2016  . Rheumatoid arthritis ([place]) 12/28/2015  . Long term current use of systemic steroids, for RA 12/28/2015  . Lymphocytosis 11/13/2012    Past Medical History:  Diagnosis Date  . Adrenal failure ([place])   . Arthritis   . Asthma   . Cataract   . Closed  nondisplaced fracture of fifth right metatarsal bone 09/18/2017  . Fibromyalgia 2008  . HCAP (healthcare-associated pneumonia) 02/03/2018  . Osteoporosis   . RA (rheumatoid arthritis) ([place])   . Recurrent upper respiratory infection (URI)   . Sepsis due to urinary tract infection ([place]) 01/18/2018  . Urticaria     Family History  Problem Relation Age of Onset  . Heart attack Maternal Grandmother   . Heart attack Paternal Grandfather   . Breast cancer Mother   . Diabetes Father   . Heart disease Father   . Allergic rhinitis Neg Hx   . Asthma Neg Hx   . Eczema Neg Hx   . Urticaria Neg Hx    Past Surgical History:  Procedure Laterality Date  . BACK SURGERY    . BILATERAL CARPAL TUNNEL RELEASE    . CATARACT EXTRACTION, BILATERAL  2004  . CERVICAL FUSION  2011,2010,2008  . HEEL SPUR SURGERY  2004  . ROTATOR CUFF REPAIR    . TONSILLECTOMY AND ADENOIDECTOMY  1947  . TOTAL SHOULDER ARTHROPLASTY     Social History   Social History Narrative  . Not on file    Objective: Vital Signs: BP 137/68 (BP Location: Left Arm, Patient Position: Sitting, Cuff Size: Normal)   Pulse 60   Ht 5\' 3"  (1.6 m)   Wt 166 lb 12.8 oz (75.7 kg)   BMI 29.55 kg/m    Physical Exam  Constitutional: She is oriented to person, place, and time. She appears well-developed and well-nourished.  HENT:  Head: Normocephalic and atraumatic.  Eyes: Conjunctivae and EOM are normal.  Neck: Normal range of motion.  Cardiovascular: Normal rate, regular rhythm, normal heart sounds and intact distal pulses.  Pulmonary/Chest: Effort normal and breath sounds normal.  Abdominal: Soft. Bowel sounds are normal.  Lymphadenopathy:    She has no cervical adenopathy.  Neurological: She is alert and oriented to person, place, and time.  Skin: Skin is warm and dry. Capillary refill takes less than 2 seconds.  Psychiatric: She has a normal mood and affect. Her behavior is normal.  Nursing note and vitals reviewed.     Musculoskeletal Exam: C-spine limited range of motion.  Lumbar spine limited range of motion.  Shoulder joints elbow joints wrist joint MCPs PIPs DIPs were in good range of motion.  She has DIP PIP thickening in her bilateral hands and bilateral feet.  She has crepitus in her bilateral knee joints without any warmth swelling or effusion.  Has generalized hyperalgesia and positive tender points.  CDAI Exam: CDAI Score: 0.9  Patient Global Assessment: 6 (mm); Provider Global Assessment: 3 (mm) Swollen: 0 ; Tender: 0  Joint Exam   Not documented   There is currently no information documented on the homunculus. Go to the Rheumatology activity and complete the homunculus joint exam.  Investigation: No additional findings.  Imaging: No results found.  Recent Labs: Lab Results  Component Value Date   WBC 11.7 (H) 08/08/2018   HGB 12.7 08/08/2018   PLT 301 08/08/2018   NA 145 (H) 08/08/2018   K 4.4 08/08/2018   CL 100 08/08/2018   CO2 27 08/08/2018   GLUCOSE 112 (H) 08/08/2018   BUN 25 08/08/2018   CREATININE 1.08 (H) 08/08/2018   BILITOT 0.6 08/08/2018   ALKPHOS 57 08/08/2018   AST 19 08/08/2018   ALT 13 08/08/2018  PROT 7.8 08/08/2018   ALBUMIN 4.6 08/08/2018   CALCIUM 10.5 (H) 08/08/2018   GFRAA 55 (L) 08/08/2018    Speciality Comments: PLQ eye exam:04/16/18 Normal. [facility] Follow up in 6 months.  Procedures:  No procedures performed Allergies: Adhesive [tape]; Celebrex [celecoxib]; Ciprofibrate; Cymbalta [duloxetine hcl]; Gabitril [tiagabine]; Keflex [cephalexin]; Lyrica [pregabalin]; Neurontin [gabapentin]; Nexium [esomeprazole]; Nsaids; Penicillins; Shrimp [shellfish allergy]; Sulfa antibiotics; Azactam [aztreonam]; Ciprofloxacin; Zantac [ranitidine hcl]; and Claritin-d 12 hour [loratadine-pseudoephedrine er]   Assessment / Plan:     Visit Diagnoses: Rheumatoid arthritis involving multiple sites with positive rheumatoid factor (HCC)-she currently does  not have any synovitis.  She has been on low-dose Plaquenil with prednisone.  She discontinued methotrexate due to frequent infections.  High risk medication use - PLQ 200 mg po qd. ( discontinued MTX in 12/2017 due to frequent infections) prednisone 7 mg p.o. dailyeye exam: 04/16/2018  Primary osteoarthritis of both hands-joint protection muscle strengthening was discussed.  Primary osteoarthritis of both knees-she has been having some discomfort in her knee joints.  She has had good response to Visco supplement injections in the past.  She would like to have repeat injections.  We will apply for Visco.  She is currently going to physical therapy for knee joint discomfort.  Primary osteoarthritis of both feet-she is not having much discomfort in her feet currently.  Age-related osteoporosis without current pathological fracture-she has been treated with Reclast in the past.  Her last bone density was in the osteopenia range.  Fibromyalgia-she has some generalized pain and discomfort and currently having a flare of fibromyalgia.  DDD (degenerative disc disease), cervical-she has limited range of motion with some discomfort.  Long term current use of systemic steroids, due to adrenal insufficiency.  Adrenal insufficiency ([place]) - She is on Prednisone 7 mg daily.   History of CHF (congestive heart failure)  History of fatigue   Orders: No orders of the defined types were placed in this encounter.  No orders of the defined types were placed in this encounter.   Face-to-face time spent with patient was 30 minutes. Greater than 50% of time was spent in counseling and coordination of care.  Follow-Up Instructions: Return in about 5 months (around 01/15/2019) for Rheumatoid arthritis, Osteoarthritis.   Bo Merino, MD  Note - This record has been created using Editor, commissioning.  Chart creation errors have been sought, but may not always  have been located. Such creation errors do not  reflect on  the standard of medical care.

## 2018-08-08 ENCOUNTER — Telehealth (INDEPENDENT_AMBULATORY_CARE_PROVIDER_SITE_OTHER): Payer: Self-pay

## 2018-08-08 ENCOUNTER — Encounter: Payer: Self-pay | Admitting: Allergy

## 2018-08-08 ENCOUNTER — Ambulatory Visit: Payer: Medicare Other | Admitting: Allergy

## 2018-08-08 VITALS — BP 114/60 | HR 73 | Temp 97.8°F | Resp 20 | Ht 62.2 in | Wt 165.8 lb

## 2018-08-08 DIAGNOSIS — J310 Chronic rhinitis: Secondary | ICD-10-CM

## 2018-08-08 DIAGNOSIS — B999 Unspecified infectious disease: Secondary | ICD-10-CM | POA: Diagnosis not present

## 2018-08-08 NOTE — Telephone Encounter (Signed)
Laureen Ochs would like a copy of patient's lab results faxed to [phone].  Cb# is [phone].  Please advise. Thank You.

## 2018-08-08 NOTE — Patient Instructions (Signed)
Allergies   - will obtain environmental allergy panel   - continue allegra 180mg  daily   - for nasal congestion use fluticasone 2 sprays each nostril daily as needed.  Use for 1-2 weeks at a time for nasal congestion and can stop once symptoms improve   - for nasal drainage recommend use of nasal antihistamines 2 sprays each 1-2 times a day   - continue singulair 10mg  at bedtime   - for allergy symptoms can use olopatadine 0.1% 1 drop each eye 1-2 times a day as needed  Recurrent infections   - will obtain immunocompetence screen today given infections this year requiring hospitalizations with IV antibiotics.  You are at risk for infections given medications requiring to manage your autoimmune disease including prednisone   We will call you with these results  Follow-up 3-4 months or sooner if needed

## 2018-08-08 NOTE — Progress Notes (Signed)
New Patient Note  RE: Eileen Wilkerson MRN: [id number] DOB: November 09, 1932 Date of Office Visit: 08/08/2018  Referring provider: Bo Merino, MD Primary care provider: Briscoe Deutscher, DO  Chief Complaint: Nasal congestion and puffy eyes  History of present illness: Eileen Wilkerson is a 84 y.o. female presenting today for consultation for allergy symptoms.  She has a complex medical history including rheumatoid arthritis, CHF, osteoporosis, adrenal insufficiency and fibromyalgia.  She does follow with rheumatologist Dr. Estanislado Pandy.    She moved [place] to a [place] center.  She states since this move she has had more allergy symptoms.  Symptoms include puffy eyes, nasal congestion, right ear pain, sinus pain and sore throat.  Denies any itchy eyes.  Her PCP started singulair that she takes a bedtime.  She also has been taking allegra daily. She has flonase as well.  She stopped these allergy medications for visit today and she does feel her symptoms have been worse.     She is on daily prednisone 7mg  daily at this time for her rheumatoid arthritis she also takes Plaquenil.  She was on methotrexate however earlier this year around February she was hospitalized for UTI leading to sepsis (urosepsis due to E. coli) requiring IV antibiotics.  She states after she improved from this infection she developed a another infection that led to sepsis thought to be due to pneumonia requiring hospitalization and IV antibiotics.  She reports she has had cellulitis before where she required IV antibiotics requiring hospitalization.  She is no longer on MTX since earlier this year.    She denies a history of eczema, asthma.  She does report having a food allergy to shrimp which she has developed facial swelling and eye swelling.  Review of systems: Review of Systems  Constitutional: Negative for chills, fever and malaise/fatigue.  HENT: Positive for congestion, ear pain, sinus pain and sore  throat. Negative for ear discharge, nosebleeds and tinnitus.   Eyes: Negative for pain, discharge and redness.  Respiratory: Negative for cough, shortness of breath, wheezing and stridor.   Cardiovascular: Negative for chest pain.  Gastrointestinal: Negative for abdominal pain, constipation, diarrhea, nausea and vomiting.  Musculoskeletal: Positive for joint pain.  Skin: Negative for itching and rash.  Neurological: Negative for headaches.    All other systems negative unless noted above in HPI  Past medical history: Past Medical History:  Diagnosis Date  . Adrenal failure ([place])   . Arthritis   . Asthma   . Cataract   . Closed nondisplaced fracture of fifth right metatarsal bone 09/18/2017  . Fibromyalgia 2008  . HCAP (healthcare-associated pneumonia) 02/03/2018  . Osteoporosis   . RA (rheumatoid arthritis) ([place])   . Recurrent upper respiratory infection (URI)   . Sepsis due to urinary tract infection ([place]) 01/18/2018  . Urticaria     Past surgical history: Past Surgical History:  Procedure Laterality Date  . BACK SURGERY    . BILATERAL CARPAL TUNNEL RELEASE    . CATARACT EXTRACTION, BILATERAL  2004  . CERVICAL FUSION  2011,2010,2008  . HEEL SPUR SURGERY  2004  . ROTATOR CUFF REPAIR    . TONSILLECTOMY AND ADENOIDECTOMY  1947  . TOTAL SHOULDER ARTHROPLASTY      Family history:  Family History  Problem Relation Age of Onset  . Heart attack Maternal Grandmother   . Heart attack Paternal Grandfather   . Breast cancer Mother   . Diabetes Father   . Heart disease Father   .  Allergic rhinitis Neg Hx   . Asthma Neg Hx   . Eczema Neg Hx   . Urticaria Neg Hx     Social history: She lives in an apartment in a retirement community.  There is carpeting in the apartment there is electric heating and central cooling.  There are no pets.  There is no concern for water damage, mildew or roaches.  She is retired.  She denies a smoking history.  Medication List: Allergies as of  08/08/2018      Reactions   Adhesive [tape] Rash   Celebrex [celecoxib] Hives   Ciprofibrate Nausea Only   Cymbalta [duloxetine Hcl] Swelling   Gabitril [tiagabine] Swelling   Keflex [cephalexin] Nausea And Vomiting   Lyrica [pregabalin] Swelling   Neurontin [gabapentin] Swelling   Nexium [esomeprazole] Rash   Nsaids Rash   Penicillins Rash   Injection site reaction. Tolerated cefepime in past Has patient had a PCN reaction causing immediate rash, facial/tongue/throat swelling, SOB or lightheadedness with hypotension: No Has patient had a PCN reaction causing severe rash involving mucus membranes or skin necrosis: No Has patient had a PCN reaction that required hospitalization No Has patient had a PCN reaction occurring within the last 10 years: No If all of the above answers are "NO", then may proceed with Cephalosporin use.   Shrimp [shellfish Allergy] Anaphylaxis   Per patient "shrimp only"   Sulfa Antibiotics Nausea And Vomiting   Azactam [aztreonam]    Hand swelling    Ciprofloxacin Other (See Comments)   dizziness   Zantac [ranitidine Hcl]    Claritin-d 12 Hour [loratadine-pseudoephedrine Er] Anxiety      Medication List        Accurate as of 08/08/18  7:07 PM. Always use your most recent med list.          acetaminophen 500 MG tablet Commonly known as:  TYLENOL Take 500 mg by mouth every 6 (six) hours as needed.   atorvastatin 20 MG tablet Commonly known as:  LIPITOR TAKE 1 TABLET BY MOUTH AT BEDTIME AS NEEDED   BIOTIN MAXIMUM STRENGTH 10 MG Tabs Generic drug:  Biotin Take 10 mg by mouth daily.   CRANBERRY PO Take 1 capsule by mouth daily.   fexofenadine 180 MG tablet Commonly known as:  ALLEGRA Take 180 mg by mouth daily.   fluticasone 50 MCG/ACT nasal spray Commonly known as:  FLONASE USE 2 SPRAY(S) IN EACH NOSTRIL ONCE DAILY   hydroxychloroquine 200 MG tablet Commonly known as:  PLAQUENIL TAKE 1 TABLET BY MOUTH ONCE DAILY   LASIX 40 MG  tablet Generic drug:  furosemide Take 40 mg by mouth daily.   montelukast 10 MG tablet Commonly known as:  SINGULAIR Take 1 tablet (10 mg total) by mouth at bedtime.   potassium chloride 10 MEQ tablet Commonly known as:  K-DUR,KLOR-CON TAKE 1 TABLET BY MOUTH TWICE DAILY   predniSONE 1 MG tablet Commonly known as:  DELTASONE Take 2 tablets (2 mg total) by mouth daily with lunch. Take as directed on tapering dose   predniSONE 5 MG tablet Commonly known as:  DELTASONE 1 tablet at breakfast and taper as instructed   traMADol 50 MG tablet Commonly known as:  ULTRAM TAKE 1 TABLET BY MOUTH THREE TIMES DAILY AS NEEDED   Vitamin D3 5000 units Caps Take 5,000 Units by mouth daily.       Known medication allergies: Allergies  Allergen Reactions  . Adhesive [Tape] Rash  . Celebrex [Celecoxib] Hives  .  Ciprofibrate Nausea Only  . Cymbalta [Duloxetine Hcl] Swelling  . Gabitril [Tiagabine] Swelling  . Keflex [Cephalexin] Nausea And Vomiting  . Lyrica [Pregabalin] Swelling  . Neurontin [Gabapentin] Swelling  . Nexium [Esomeprazole] Rash  . Nsaids Rash  . Penicillins Rash    Injection site reaction. Tolerated cefepime in past Has patient had a PCN reaction causing immediate rash, facial/tongue/throat swelling, SOB or lightheadedness with hypotension: No Has patient had a PCN reaction causing severe rash involving mucus membranes or skin necrosis: No Has patient had a PCN reaction that required hospitalization No Has patient had a PCN reaction occurring within the last 10 years: No If all of the above answers are "NO", then may proceed with Cephalosporin use.   . Shrimp [Shellfish Allergy] Anaphylaxis    Per patient "shrimp only"  . Sulfa Antibiotics Nausea And Vomiting  . Azactam [Aztreonam]     Hand swelling   . Ciprofloxacin Other (See Comments)    dizziness  . Zantac [Ranitidine Hcl]   . Claritin-D 12 Hour [Loratadine-Pseudoephedrine Er] Anxiety     Physical  examination: Blood pressure 114/60, pulse 73, temperature 97.8 F (36.6 C), temperature source Oral, resp. rate 20, height 5' 2.2" (1.58 m), weight 165 lb 12.8 oz (75.2 kg), SpO2 97 %.  General: Alert, interactive, in no acute distress. HEENT: PERRLA, TMs pearly gray, turbinates mildly edematous without discharge, post-pharynx non erythematous. Neck: Supple without lymphadenopathy. Lungs: Clear to auscultation without wheezing, rhonchi or rales. {no increased work of breathing. CV: Normal S1, S2 without murmurs. Abdomen: Nondistended, nontender. Skin: Multiple ecchymotic lesions on her arms bilaterally. Extremities:  No clubbing, cyanosis or edema. Neuro:   Grossly intact.  Diagnositics/Labs: Labs: Reviewed labs in EMR from hospitalization from February and March 2019 with unremarkable infectious work-up  Assessment and plan: Rhinitis, presumed allergic   - will obtain environmental allergy panel   - continue allegra 180mg  daily   - for nasal congestion use fluticasone 2 sprays each nostril daily as needed.  Use for 1-2 weeks at a time for nasal congestion and can stop once symptoms improve   - for nasal drainage recommend use of nasal antihistamines 2 sprays each 1-2 times a day   - continue singulair 10mg  at bedtime   - for allergy symptoms can use olopatadine 0.1% 1 drop each eye 1-2 times a day as needed  Recurrent infections   - will obtain immunocompetence screen today given infections this year requiring hospitalizations with IV antibiotics.  She is at risk for infections given degree of immunosuppression for management of her RA.  However she may have component of poor antibody response or less likely an underlying immunodeficiency also making her susceptible to infections.  We will call you with these results  Follow-up 3-4 months or sooner if needed  I appreciate the opportunity to take part in Serenah's care. Please do not hesitate to contact me with  questions.  Sincerely,   Prudy Feeler, MD Allergy/Immunology [facility]

## 2018-08-09 ENCOUNTER — Telehealth: Payer: Self-pay | Admitting: Allergy

## 2018-08-09 MED ORDER — OLOPATADINE HCL 0.1 % OP SOLN: 1.0000 [drp] | Freq: Two times a day (BID) | OPHTHALMIC | 5 refills | Status: DC | PRN

## 2018-08-09 NOTE — Telephone Encounter (Signed)
Called and informed patient that I have sent in Olopatadine 0.1% 1 drop each eye 1-2 times daily as needed. I also inform patient that the nasal antihistamine is OTC nasal spray. Patient understood and thank me for getting back to her so quick.

## 2018-08-09 NOTE — Telephone Encounter (Signed)
Attempted to contact the patient and left message for patient to call the office.  

## 2018-08-09 NOTE — Telephone Encounter (Signed)
Patient was seen yesterday, 08-08-18, and she said prescriptions were to be sent in and they were not. She needs a nasal antihistimine and eyedrops. [facility] on [address]. She also uses Allegra (not a prescription) and fluticasone, but does not need that one filled, she has a prescription for that.

## 2018-08-13 ENCOUNTER — Other Ambulatory Visit: Payer: Self-pay | Admitting: Family Medicine

## 2018-08-13 NOTE — Telephone Encounter (Signed)
Please advise on refills.  

## 2018-08-14 LAB — ALLERGENS W/TOTAL IGE AREA 2

## 2018-08-14 LAB — T + B-LYMPHOCYTE DIFFERENTIAL
% CD 3 Pos. Lymph.: 71.3 % (ref 57.5–86.2)
% CD 4 Pos. Lymph.: 40.2 % (ref 30.8–58.5)
Absolute CD 3: 1355 /uL (ref 622–2402)
Absolute CD 4 Helper: 764 /uL (ref 359–1519)
Basophils Absolute: 0 10*3/uL (ref 0.0–0.2)
Basos: 0 %
CD19 % B Cell: 2.7 % — ABNORMAL LOW (ref 3.3–25.4)
CD19 Abs: 51 /uL (ref 12–645)
CD4/CD8 Ratio: 1.22 (ref 0.92–3.72)
CD8 % Suppressor T Cell: 32.9 % (ref 12.0–35.5)
CD8 T Cell Abs: 625 /uL (ref 109–897)
EOS (ABSOLUTE): 0 10*3/uL (ref 0.0–0.4)
Eos: 0 %
Hematocrit: 38.3 % (ref 34.0–46.6)
Hemoglobin: 12.7 g/dL (ref 11.1–15.9)
Immature Grans (Abs): 0 10*3/uL (ref 0.0–0.1)
Immature Granulocytes: 0 %
Lymphocytes Absolute: 1.9 10*3/uL (ref 0.7–3.1)
Lymphs: 17 %
MCH: 31.4 pg (ref 26.6–33.0)
MCHC: 33.2 g/dL (ref 31.5–35.7)
MCV: 95 fL (ref 79–97)
Monocytes Absolute: 0.5 10*3/uL (ref 0.1–0.9)
Monocytes: 4 %
Neutrophils Absolute: 9.1 10*3/uL — ABNORMAL HIGH (ref 1.4–7.0)
Neutrophils: 79 %
Platelets: 301 10*3/uL (ref 150–450)
RBC: 4.04 x10E6/uL (ref 3.77–5.28)
RDW: 12.9 % (ref 12.3–15.4)
WBC: 11.7 10*3/uL — ABNORMAL HIGH (ref 3.4–10.8)

## 2018-08-14 LAB — STREP PNEUMONIAE 23 SEROTYPES IGG
Pneumo Ab Type 1*: 6.9 ug/mL (ref >1.3)
Pneumo Ab Type 12 (12F)*: 1.5 ug/mL (ref >1.3)
Pneumo Ab Type 14*: 1.1 ug/mL — ABNORMAL LOW (ref >1.3)
Pneumo Ab Type 17 (17F)*: 1.2 ug/mL — ABNORMAL LOW (ref >1.3)
Pneumo Ab Type 19 (19F)*: 2.6 ug/mL (ref >1.3)
Pneumo Ab Type 2*: 1.2 ug/mL — ABNORMAL LOW (ref >1.3)
Pneumo Ab Type 20*: 7.2 ug/mL (ref >1.3)
Pneumo Ab Type 22 (22F)*: 3.6 ug/mL (ref >1.3)
Pneumo Ab Type 23 (23F)*: 5 ug/mL (ref >1.3)
Pneumo Ab Type 26 (6B)*: 2.4 ug/mL (ref >1.3)
Pneumo Ab Type 3*: 2.3 ug/mL (ref >1.3)
Pneumo Ab Type 34 (10A)*: 5.7 ug/mL (ref >1.3)
Pneumo Ab Type 4*: 1.3 ug/mL — ABNORMAL LOW (ref >1.3)
Pneumo Ab Type 43 (11A)*: 1.2 ug/mL — ABNORMAL LOW (ref >1.3)
Pneumo Ab Type 5*: 2.6 ug/mL (ref >1.3)
Pneumo Ab Type 51 (7F)*: 0.9 ug/mL — ABNORMAL LOW (ref >1.3)
Pneumo Ab Type 54 (15B)*: 6.4 ug/mL (ref >1.3)
Pneumo Ab Type 56 (18C)*: 6.4 ug/mL (ref >1.3)
Pneumo Ab Type 57 (19A)*: 0.8 ug/mL — ABNORMAL LOW (ref >1.3)
Pneumo Ab Type 68 (9V)*: 1.8 ug/mL (ref >1.3)
Pneumo Ab Type 70 (33F)*: 0.7 ug/mL — ABNORMAL LOW (ref >1.3)
Pneumo Ab Type 8*: 2 ug/mL (ref >1.3)
Pneumo Ab Type 9 (9N)*: 0.4 ug/mL — ABNORMAL LOW (ref >1.3)

## 2018-08-14 LAB — DIPHTHERIA / TETANUS ANTIBODY PANEL
Diphtheria Ab: 0.26 IU/mL (ref <0.10)
Tetanus Ab, IgG: 0.52 IU/mL (ref <0.10)

## 2018-08-14 LAB — COMPREHENSIVE METABOLIC PANEL
ALT: 13 IU/L (ref 0–32)
AST: 19 IU/L (ref 0–40)
Albumin/Globulin Ratio: 1.4 (ref 1.2–2.2)
Albumin: 4.6 g/dL (ref 3.5–4.7)
Alkaline Phosphatase: 57 IU/L (ref 39–117)
BUN/Creatinine Ratio: 23 (ref 12–28)
BUN: 25 mg/dL (ref 8–27)
Bilirubin Total: 0.6 mg/dL (ref 0.0–1.2)
CO2: 27 mmol/L (ref 20–29)
Calcium: 10.5 mg/dL — ABNORMAL HIGH (ref 8.7–10.3)
Chloride: 100 mmol/L (ref 96–106)
Creatinine, Ser: 1.08 mg/dL — ABNORMAL HIGH (ref 0.57–1.00)
GFR calc Af Amer: 55 mL/min/{1.73_m2} — ABNORMAL LOW (ref >59)
GFR calc non Af Amer: 48 mL/min/{1.73_m2} — ABNORMAL LOW (ref >59)
Globulin, Total: 3.2 g/dL (ref 1.5–4.5)
Glucose: 112 mg/dL — ABNORMAL HIGH (ref 65–99)
Potassium: 4.4 mmol/L (ref 3.5–5.2)
Sodium: 145 mmol/L — ABNORMAL HIGH (ref 134–144)
Total Protein: 7.8 g/dL (ref 6.0–8.5)

## 2018-08-14 LAB — IGG, IGA, IGM
IgA/Immunoglobulin A, Serum: 227 mg/dL (ref 64–422)
IgG (Immunoglobin G), Serum: 1186 mg/dL (ref 700–1600)
IgM (Immunoglobulin M), Srm: 60 mg/dL (ref 26–217)

## 2018-08-14 LAB — COMPLEMENT, TOTAL: Compl, Total (CH50): >60 U/mL (ref 42–999999)

## 2018-08-15 ENCOUNTER — Ambulatory Visit: Payer: Medicare Other | Admitting: Rheumatology

## 2018-08-15 ENCOUNTER — Encounter: Payer: Self-pay | Admitting: Rheumatology

## 2018-08-15 ENCOUNTER — Telehealth: Payer: Self-pay

## 2018-08-15 VITALS — BP 137/68 | HR 60 | Ht 63.0 in | Wt 166.8 lb

## 2018-08-15 DIAGNOSIS — M810 Age-related osteoporosis without current pathological fracture: Secondary | ICD-10-CM

## 2018-08-15 DIAGNOSIS — M19072 Primary osteoarthritis, left ankle and foot: Secondary | ICD-10-CM

## 2018-08-15 DIAGNOSIS — M19041 Primary osteoarthritis, right hand: Secondary | ICD-10-CM | POA: Diagnosis not present

## 2018-08-15 DIAGNOSIS — Z79899 Other long term (current) drug therapy: Secondary | ICD-10-CM | POA: Diagnosis not present

## 2018-08-15 DIAGNOSIS — M19071 Primary osteoarthritis, right ankle and foot: Secondary | ICD-10-CM

## 2018-08-15 DIAGNOSIS — E2740 Unspecified adrenocortical insufficiency: Secondary | ICD-10-CM

## 2018-08-15 DIAGNOSIS — M19042 Primary osteoarthritis, left hand: Secondary | ICD-10-CM

## 2018-08-15 DIAGNOSIS — M0579 Rheumatoid arthritis with rheumatoid factor of multiple sites without organ or systems involvement: Secondary | ICD-10-CM

## 2018-08-15 DIAGNOSIS — M797 Fibromyalgia: Secondary | ICD-10-CM

## 2018-08-15 DIAGNOSIS — Z87898 Personal history of other specified conditions: Secondary | ICD-10-CM

## 2018-08-15 DIAGNOSIS — M170 Bilateral primary osteoarthritis of knee: Secondary | ICD-10-CM | POA: Diagnosis not present

## 2018-08-15 DIAGNOSIS — Z7952 Long term (current) use of systemic steroids: Secondary | ICD-10-CM

## 2018-08-15 DIAGNOSIS — Z8679 Personal history of other diseases of the circulatory system: Secondary | ICD-10-CM

## 2018-08-15 DIAGNOSIS — M5030 Other cervical disc degeneration, unspecified cervical region: Secondary | ICD-10-CM

## 2018-08-15 NOTE — Progress Notes (Signed)
Labs are stable.

## 2018-08-15 NOTE — Telephone Encounter (Signed)
Please apply for bilateral knee visco, per Dr. Deveshwar. Thanks!  

## 2018-08-15 NOTE — Patient Instructions (Signed)
Standing Labs We placed an order today for your standing lab work.    Please come back and get your standing labs in November  We have open lab Monday through Friday from 8:30-11:30 AM and 1:30-4:00 PM  at the office of Dr. Bo Merino.   You may experience shorter wait times on Monday and Friday afternoons. The office is located at [address], [place], [place], [place] [address] No appointment is necessary.   Labs are drawn by [facility].  You may receive a bill from [facility] for your lab work. If you have any questions regarding directions or hours of operation,  please call [phone].

## 2018-08-19 ENCOUNTER — Telehealth (INDEPENDENT_AMBULATORY_CARE_PROVIDER_SITE_OTHER): Payer: Self-pay

## 2018-08-19 NOTE — Telephone Encounter (Signed)
Submitted VOB for Synvisc series, bilateral knee. Euflexxa is not a preferred product through patient's insurance.

## 2018-08-19 NOTE — Telephone Encounter (Signed)
Noted  

## 2018-08-20 ENCOUNTER — Telehealth (INDEPENDENT_AMBULATORY_CARE_PROVIDER_SITE_OTHER): Payer: Self-pay

## 2018-08-20 NOTE — Telephone Encounter (Signed)
Please schedule patient appointment with Dr. Estanislado Pandy. Appointment needs to be scheduled after 09/18/2018. Thank You.  Patient is approved for Synvisc series, bilateral knee [place] Covered at 100% after Co-pay $85.00 Co-pay each visit No PA required

## 2018-08-23 NOTE — Telephone Encounter (Signed)
LMOM for patient to call and schedule injections. °

## 2018-08-29 ENCOUNTER — Telehealth: Payer: Self-pay | Admitting: Rheumatology

## 2018-08-29 MED ORDER — PREDNISONE 1 MG PO TABS: 2.0000 mg | ORAL_TABLET | Freq: Every day | ORAL | 1 refills | Status: DC

## 2018-08-29 NOTE — Telephone Encounter (Signed)
Patient called requesting prescription refill of Prednisone 1 mg tablets to [facility] on [facility].  Patient states she is out of medication.

## 2018-08-29 NOTE — Telephone Encounter (Signed)
Last Visit: 08/15/18 Next Visit: 01/08/19  Okay to refill per Dr. Deveshwar 

## 2018-09-03 ENCOUNTER — Ambulatory Visit: Payer: Medicare Other

## 2018-09-09 ENCOUNTER — Ambulatory Visit (INDEPENDENT_AMBULATORY_CARE_PROVIDER_SITE_OTHER): Payer: Medicare Other

## 2018-09-09 ENCOUNTER — Telehealth: Payer: Self-pay | Admitting: Allergy

## 2018-09-09 DIAGNOSIS — E538 Deficiency of other specified B group vitamins: Secondary | ICD-10-CM | POA: Diagnosis not present

## 2018-09-09 MED ORDER — CYANOCOBALAMIN 1000 MCG/ML IJ SOLN
1000.0000 ug | Freq: Once | INTRAMUSCULAR | Status: AC
  Administered 2018-09-09: 1000 ug via INTRAMUSCULAR

## 2018-09-09 NOTE — Progress Notes (Signed)
Per orders of Dr. Wallace, injection of vitamin B12 1000 mcg given in right deltoid by Amber Agner, CMA. Patient tolerated injection well.  

## 2018-09-09 NOTE — Telephone Encounter (Signed)
Pt called and said that she needs some one to call her back about her labs. She said that she did not get any phone calls or message . [phone]

## 2018-09-09 NOTE — Telephone Encounter (Signed)
Advise patient of labs

## 2018-09-10 ENCOUNTER — Other Ambulatory Visit: Payer: Self-pay | Admitting: Family Medicine

## 2018-09-11 NOTE — Telephone Encounter (Signed)
Ok to refill 

## 2018-09-23 ENCOUNTER — Ambulatory Visit (INDEPENDENT_AMBULATORY_CARE_PROVIDER_SITE_OTHER): Payer: Medicare Other | Admitting: Physician Assistant

## 2018-09-23 DIAGNOSIS — M170 Bilateral primary osteoarthritis of knee: Secondary | ICD-10-CM | POA: Diagnosis not present

## 2018-09-23 MED ORDER — HYLAN G-F 20 16 MG/2ML IX SOSY
16.0000 mg | PREFILLED_SYRINGE | INTRA_ARTICULAR | Status: AC | PRN
  Administered 2018-09-23: 16 mg via INTRA_ARTICULAR

## 2018-09-23 MED ORDER — LIDOCAINE HCL 1 % IJ SOLN
1.5000 mL | INTRAMUSCULAR | Status: AC | PRN
  Administered 2018-09-23: 1.5 mL

## 2018-09-23 NOTE — Progress Notes (Signed)
   Procedure Note  Patient: Eileen Wilkerson             Date of Birth: Nov 02, 1932           MRN: [id number]             Visit Date: 09/23/2018  Procedures: Visit Diagnoses: Primary osteoarthritis of both knees - Plan: Large Joint Inj: bilateral knee Synvisc #1 bilateral B/B Large Joint Inj: bilateral knee on 09/23/2018 1:28 PM Indications: pain Details: 27 G 1.5 in needle, medial approach  Arthrogram: No  Medications (Right): 16 mg Hylan 16 MG/2ML; 1.5 mL lidocaine 1 % Aspirate (Right): 0 mL Medications (Left): 16 mg Hylan 16 MG/2ML; 1.5 mL lidocaine 1 % Aspirate (Left): 0 mL Outcome: tolerated well, no immediate complications Procedure, treatment alternatives, risks and benefits explained, specific risks discussed. Consent was given by the patient. Immediately prior to procedure a time out was called to verify the correct patient, procedure, equipment, support staff and site/side marked as required. Patient was prepped and draped in the usual sterile fashion.      Patient tolerated the procedure well.   Hazel Sams, PA-C

## 2018-09-30 ENCOUNTER — Ambulatory Visit: Payer: Medicare Other | Admitting: Physician Assistant

## 2018-09-30 DIAGNOSIS — M170 Bilateral primary osteoarthritis of knee: Secondary | ICD-10-CM

## 2018-09-30 MED ORDER — HYLAN G-F 20 16 MG/2ML IX SOSY
16.0000 mg | PREFILLED_SYRINGE | INTRA_ARTICULAR | Status: AC | PRN
  Administered 2018-09-30: 16 mg via INTRA_ARTICULAR

## 2018-09-30 MED ORDER — LIDOCAINE HCL 1 % IJ SOLN
1.5000 mL | INTRAMUSCULAR | Status: AC | PRN
  Administered 2018-09-30: 1.5 mL

## 2018-09-30 NOTE — Progress Notes (Signed)
   Procedure Note  Patient: AIGNER HORSEMAN             Date of Birth: 1932-01-02           MRN: [id number]             Visit Date: 09/30/2018  Procedures: Visit Diagnoses: Primary osteoarthritis of both knees   Synvisc #2 bilateral knee joint injections   Large Joint Inj: bilateral knee on 09/30/2018 1:40 PM Indications: pain Details: 27 G 1.5 in needle, medial approach  Arthrogram: No  Medications (Right): 16 mg Hylan 16 MG/2ML; 1.5 mL lidocaine 1 % Aspirate (Right): 0 mL Medications (Left): 16 mg Hylan 16 MG/2ML; 1.5 mL lidocaine 1 % Aspirate (Left): 0 mL Outcome: tolerated well, no immediate complications Procedure, treatment alternatives, risks and benefits explained, specific risks discussed. Consent was given by the patient. Immediately prior to procedure a time out was called to verify the correct patient, procedure, equipment, support staff and site/side marked as required. Patient was prepped and draped in the usual sterile fashion.     Patient tolerated the procedure well.  Hazel Sams, PA-C

## 2018-10-07 ENCOUNTER — Ambulatory Visit: Payer: Medicare Other | Admitting: Physician Assistant

## 2018-10-07 DIAGNOSIS — M170 Bilateral primary osteoarthritis of knee: Secondary | ICD-10-CM

## 2018-10-07 IMAGING — CT CT ABD-PELV W/O CM
2 of 4 series · 16 of 46 positions shown, 18 images · non-contrast
Comparison: CT lumbar spine 04/21/2015

CLINICAL DATA: Nausea and vomiting

EXAM:
CT ABDOMEN AND PELVIS WITHOUT CONTRAST
TECHNIQUE: Multidetector CT imaging of the abdomen and pelvis was performed
following the standard protocol without IV contrast.

[Series 2: axial st · axial · 0.92mm/px · z∈[-509,-104]mm · 13 of 93 slices shown, 15 images]
[im 6/93  soft-tissue]
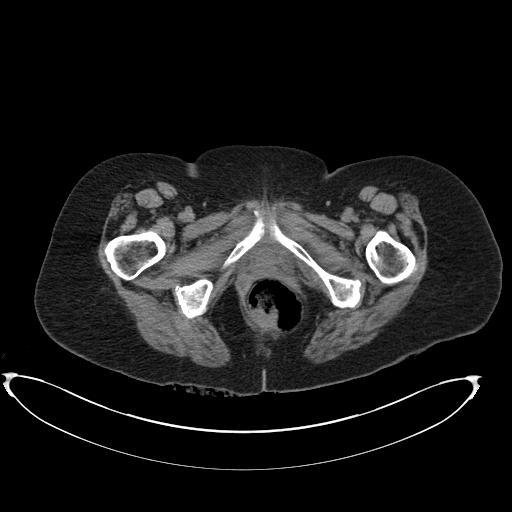
[im 6/93  bone]
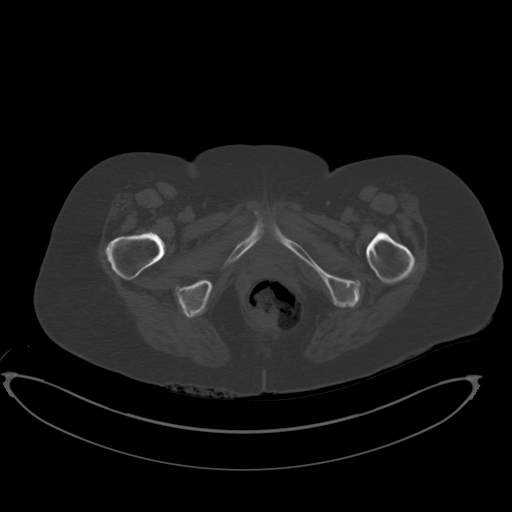
[im 11/93  soft-tissue]
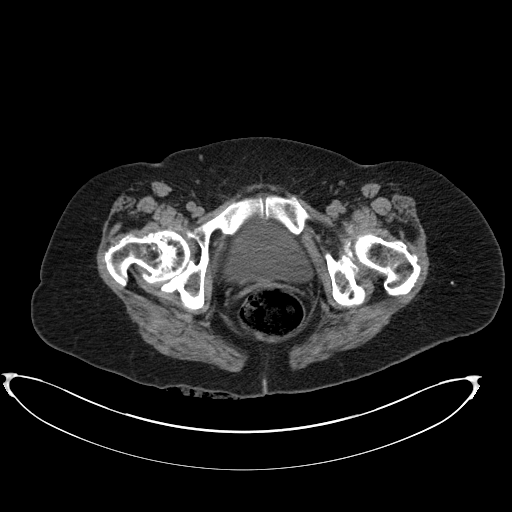
[im 21/93  soft-tissue]
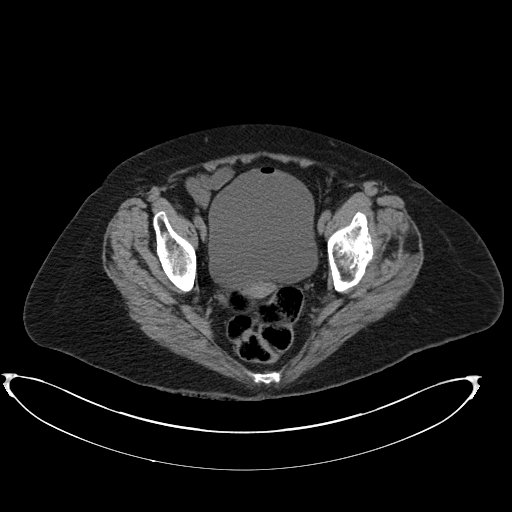
[im 26/93  soft-tissue]
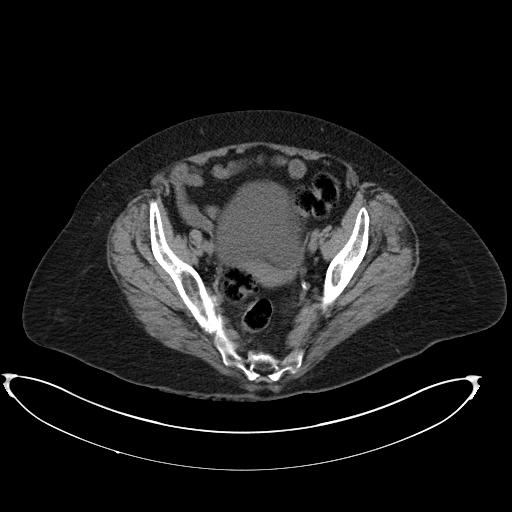
[im 31/93  soft-tissue]
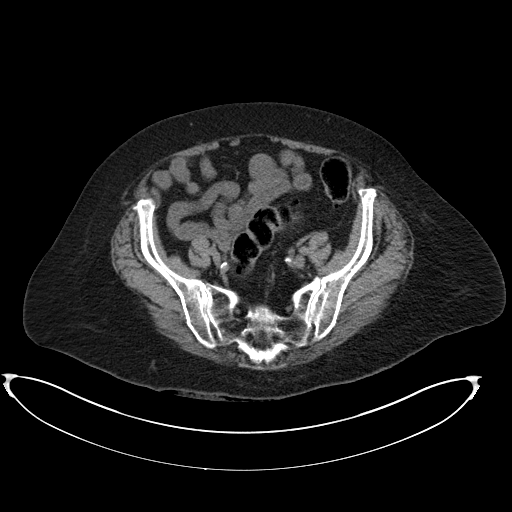
[im 41/93  soft-tissue]
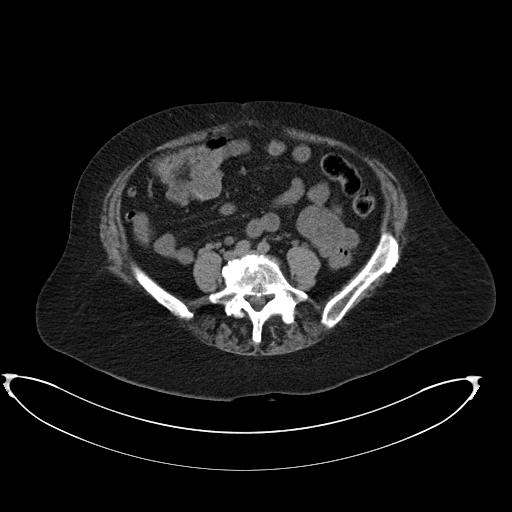
[im 47/93  soft-tissue]
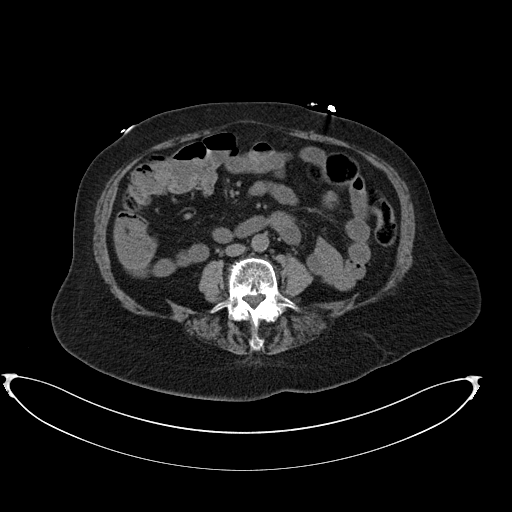
[im 52/93  soft-tissue]
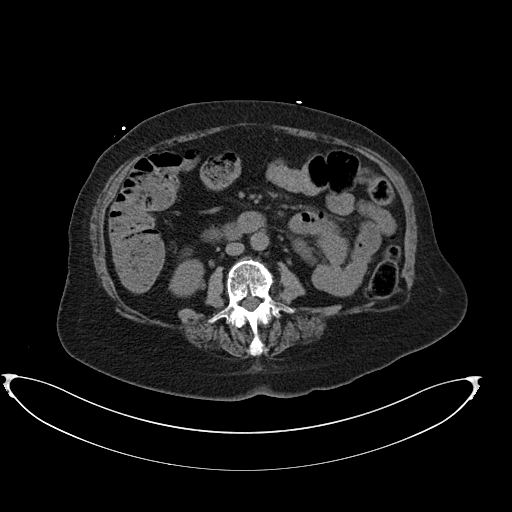
[im 62/93  soft-tissue]
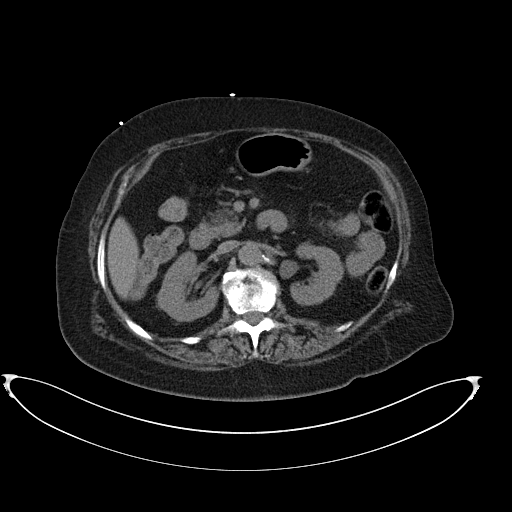
[im 62/93  bone]
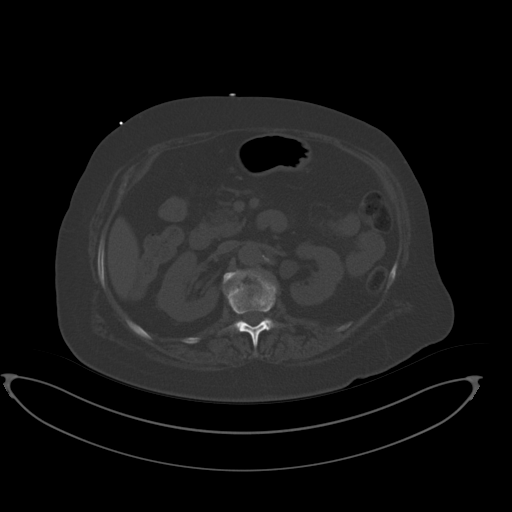
[im 67/93  soft-tissue]
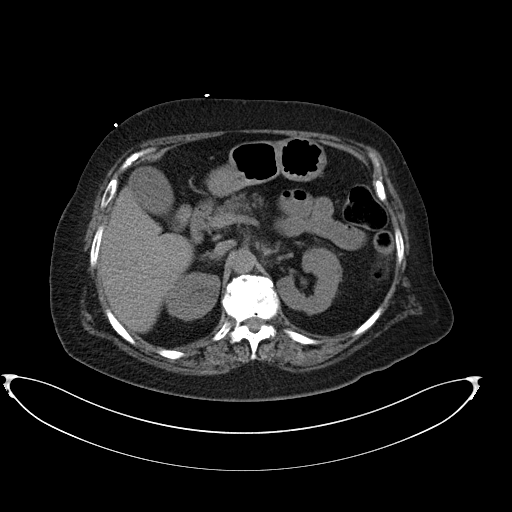
[im 72/93  soft-tissue]
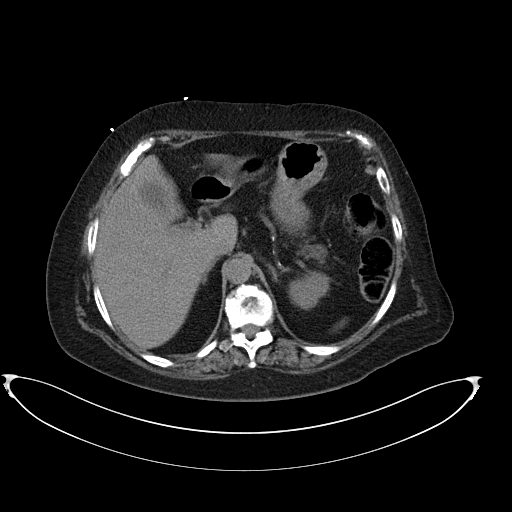
[im 82/93  soft-tissue]
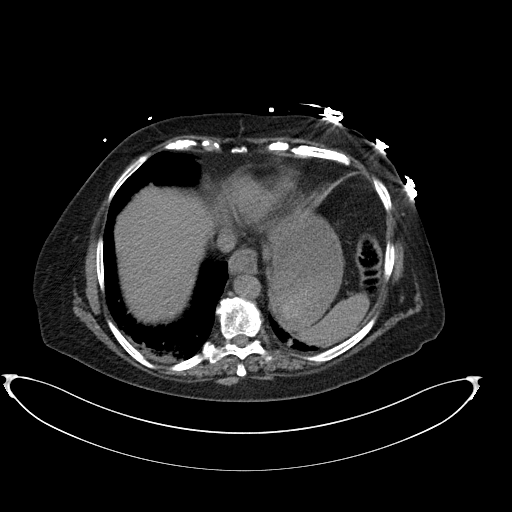
[im 87/93  soft-tissue]
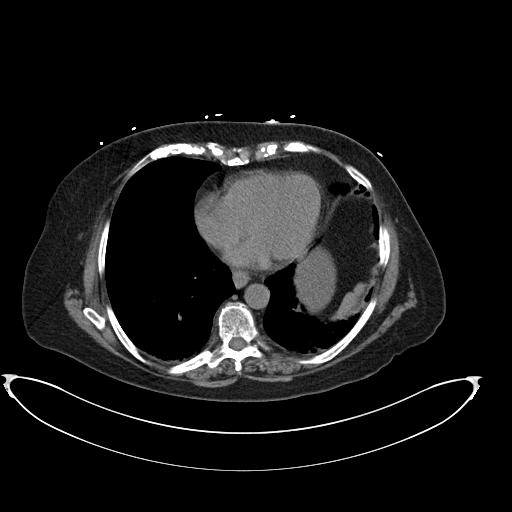

[Series 5: coronal st · coronal · 0.80mm/px · 3 of 89 slices shown]
[im 30/89  soft-tissue]
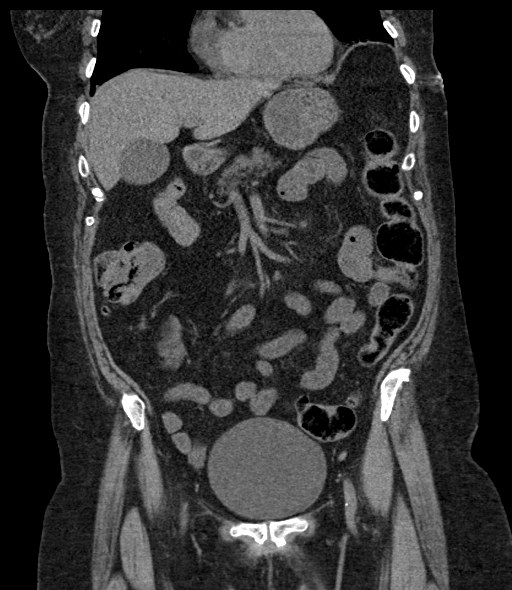
[im 40/89  soft-tissue]
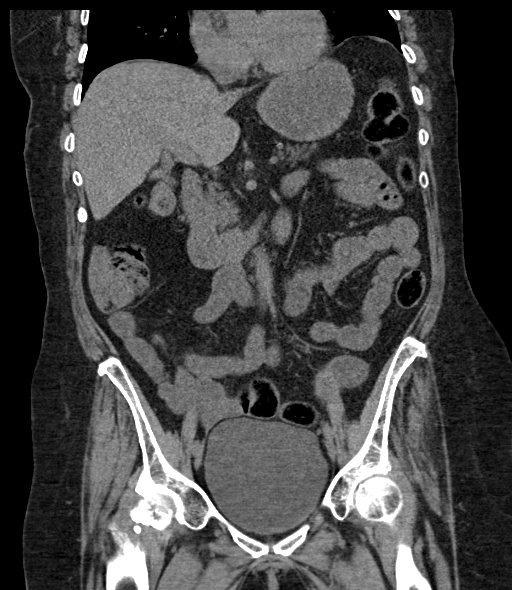
[im 49/89  soft-tissue]
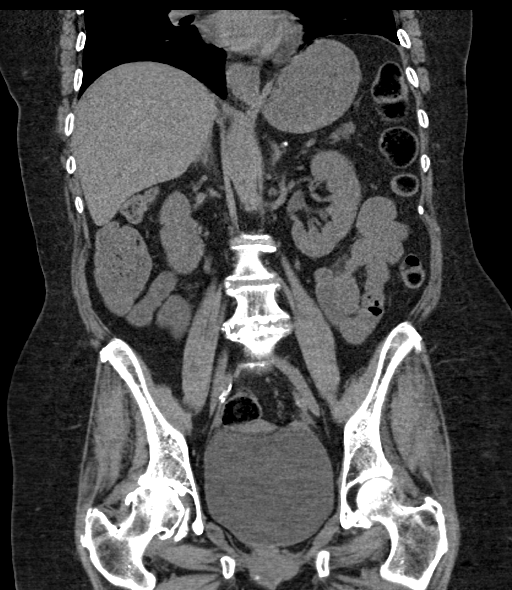

[16 of 46 positions shown; findings below may reference images not displayed]

FINDINGS: Lower chest: Lung bases demonstrate patchy dependent consolidations.
This most likely reflects atelectasis. Mild tree-in-bud densities in
the lingula, right middle lobe and bilateral lung bases. No pleural
effusion. Normal heart size.

Hepatobiliary: No focal liver abnormality is seen. No gallstones,
gallbladder wall thickening, or biliary dilatation.

Pancreas: Unremarkable. No pancreatic ductal dilatation or
surrounding inflammatory changes.

Spleen: Normal in size without focal abnormality.

Adrenals/Urinary Tract: Adrenal glands are unremarkable. Kidneys are
normal, without renal calculi, focal lesion, or hydronephrosis.
Bladder is unremarkable.

Stomach/Bowel: Stomach is within normal limits. Appendix appears
normal. No evidence of bowel wall thickening, distention, or
inflammatory changes.

Vascular/Lymphatic: Mild aortic atherosclerosis. No aneurysmal
dilatation. No significantly enlarged lymph nodes

Reproductive: Uterus and bilateral adnexa are unremarkable.

Other: Negative for free air or free fluid. Tiny fat in the
umbilical region

Musculoskeletal: Degenerative changes with interbody devices at
L3-L4 and L4-L5.
IMPRESSION: 1. No CT evidence for acute intra-abdominal or pelvic abnormality.
Negative for bowel obstruction or bowel wall thickening
2. Findings suggesting bronchiolitis or atypical infection at the
bilateral lung bases, lingula and right middle lobe.

## 2018-10-07 MED ORDER — LIDOCAINE HCL 1 % IJ SOLN
1.5000 mL | INTRAMUSCULAR | Status: AC | PRN
  Administered 2018-10-07: 1.5 mL

## 2018-10-07 MED ORDER — HYLAN G-F 20 16 MG/2ML IX SOSY
16.0000 mg | PREFILLED_SYRINGE | INTRA_ARTICULAR | Status: AC | PRN
  Administered 2018-10-07: 16 mg via INTRA_ARTICULAR

## 2018-10-07 NOTE — Progress Notes (Signed)
   Procedure Note  Patient: Eileen Wilkerson             Date of Birth: 06-19-1932           MRN: [id number]             Visit Date: 10/07/2018  Procedures: Visit Diagnoses: Primary osteoarthritis of both knees - Plan: Large Joint Inj: bilateral knee Synvisc #3 bilateral B/B Large Joint Inj: bilateral knee on 10/07/2018 9:11 AM Indications: pain Details: 25 G 1.5 in needle, medial approach  Arthrogram: No  Medications (Right): 16 mg Hylan 16 MG/2ML; 1.5 mL lidocaine 1 % Aspirate (Right): 0 mL Medications (Left): 16 mg Hylan 16 MG/2ML; 1.5 mL lidocaine 1 % Aspirate (Left): 0 mL Outcome: tolerated well, no immediate complications Procedure, treatment alternatives, risks and benefits explained, specific risks discussed. Consent was given by the patient. Immediately prior to procedure a time out was called to verify the correct patient, procedure, equipment, support staff and site/side marked as required. Patient was prepped and draped in the usual sterile fashion.     Patient tolerated the procedure well.  Hazel Sams, PA-C

## 2018-10-09 ENCOUNTER — Ambulatory Visit: Payer: Medicare Other | Admitting: Allergy

## 2018-10-09 ENCOUNTER — Encounter: Payer: Self-pay | Admitting: Allergy

## 2018-10-09 ENCOUNTER — Encounter: Payer: Self-pay | Admitting: Surgical

## 2018-10-09 ENCOUNTER — Ambulatory Visit (INDEPENDENT_AMBULATORY_CARE_PROVIDER_SITE_OTHER): Payer: Medicare Other | Admitting: Surgical

## 2018-10-09 ENCOUNTER — Other Ambulatory Visit: Payer: Self-pay | Admitting: Family Medicine

## 2018-10-09 VITALS — BP 112/72 | HR 67 | Resp 18

## 2018-10-09 DIAGNOSIS — J310 Chronic rhinitis: Secondary | ICD-10-CM | POA: Diagnosis not present

## 2018-10-09 DIAGNOSIS — E538 Deficiency of other specified B group vitamins: Secondary | ICD-10-CM | POA: Diagnosis not present

## 2018-10-09 DIAGNOSIS — Z91018 Allergy to other foods: Secondary | ICD-10-CM | POA: Diagnosis not present

## 2018-10-09 DIAGNOSIS — B999 Unspecified infectious disease: Secondary | ICD-10-CM | POA: Diagnosis not present

## 2018-10-09 MED ORDER — EPINEPHRINE 0.3 MG/0.3ML IJ SOAJ: 0.3000 mg | Freq: Once | INTRAMUSCULAR | 2 refills | Status: AC

## 2018-10-09 MED ORDER — CYANOCOBALAMIN 1000 MCG/ML IJ SOLN
1000.0000 ug | Freq: Once | INTRAMUSCULAR | Status: AC
  Administered 2018-10-09: 1000 ug via INTRAMUSCULAR

## 2018-10-09 MED ORDER — FLUTICASONE PROPIONATE 50 MCG/ACT NA SUSP: 1.0000 | Freq: Every day | NASAL | 5 refills | Status: DC

## 2018-10-09 MED ORDER — AZELASTINE HCL 0.1 % NA SOLN: 1.0000 | Freq: Two times a day (BID) | NASAL | 5 refills | Status: DC

## 2018-10-09 NOTE — Progress Notes (Signed)
Follow-up Note  RE: Eileen Wilkerson MRN: [id number] DOB: 05-31-1932 Date of Office Visit: 10/09/2018   History of present illness: Eileen Wilkerson is a 84 y.o. female presenting today for follow-up of rhinitis and recurrent infections.  She was last seen in the office on 08/08/18 by myself for initial evaluation.  She states that her nasal and ocular symptoms are the same.  After last visit she did triel use of olopatadine but she states this drop as well as nasacort (provided with sample after last visit) made her feel lethargic and that her arthritis seemed to be worse.  These were the only 2 new medications thus she stopped these and states within 2 days she felt better.  She states that Fluticasone has worked for her in the past but states with branded Flonase it also made her nasal congestion worse.  She did not get Astelin after last visit.  She does continue on singulair without an issue.   She has not had any further infections or antibiotic needs since last visit.  She also states that her residential home serves a lot of shrimp primarily of which she has a long-standing shrimp allergy.  She states in the first 13 days she was there they served shrimp dishes 4 times.  She also states the shrimp dishes are served buffet style.  She states she has been able to stay away from the shrimp buffet by eating at the deli option they have those days but she still have exposure to shrimp and this is not sure if this may be impacting her allergy symptoms.  She does not have an epipen and states in the past with reactions she would just take benadryl.  She recalls having swelling especially around the eyes in the past.   Review of systems: Review of Systems  Constitutional: Negative for chills, fever and malaise/fatigue.  HENT: Positive for congestion. Negative for ear discharge, nosebleeds and sore throat.   Eyes: Negative for pain, discharge and redness.  Respiratory: Negative for cough,  shortness of breath and wheezing.   Cardiovascular: Negative for chest pain.  Gastrointestinal: Negative for abdominal pain, constipation, diarrhea, heartburn, nausea and vomiting.  Musculoskeletal: Negative for joint pain.  Skin: Negative for itching and rash.  Neurological: Negative for headaches.    All other systems negative unless noted above in HPI  Past medical/social/surgical/family history have been reviewed and are unchanged unless specifically indicated below.  No changes  Medication List: Allergies as of 10/09/2018      Reactions   Adhesive [tape] Rash   Celebrex [celecoxib] Hives   Ciprofibrate Nausea Only   Cymbalta [duloxetine Hcl] Swelling   Gabitril [tiagabine] Swelling   Keflex [cephalexin] Nausea And Vomiting   Lyrica [pregabalin] Swelling   Neurontin [gabapentin] Swelling   Nexium [esomeprazole] Rash   Nsaids Rash   Penicillins Rash   Injection site reaction. Tolerated cefepime in past Has patient had a PCN reaction causing immediate rash, facial/tongue/throat swelling, SOB or lightheadedness with hypotension: No Has patient had a PCN reaction causing severe rash involving mucus membranes or skin necrosis: No Has patient had a PCN reaction that required hospitalization No Has patient had a PCN reaction occurring within the last 10 years: No If all of the above answers are "NO", then may proceed with Cephalosporin use.   Shrimp [shellfish Allergy] Anaphylaxis   Per patient "shrimp only"   Sulfa Antibiotics Nausea And Vomiting   Azactam [aztreonam]    Hand swelling  Ciprofloxacin Other (See Comments)   dizziness   Zantac [ranitidine Hcl]    Claritin-d 12 Hour [loratadine-pseudoephedrine Er] Anxiety      Medication List        Accurate as of 10/09/18 11:59 PM. Always use your most recent med list.          acetaminophen 500 MG tablet Commonly known as:  TYLENOL Take 500 mg by mouth every 6 (six) hours as needed.   atorvastatin 20 MG  tablet Commonly known as:  LIPITOR TAKE 1 TABLET BY MOUTH AT BEDTIME AS NEEDED   azelastine 0.1 % nasal spray Commonly known as:  ASTELIN Place 1-2 sprays into both nostrils 2 (two) times daily.   BIOTIN MAXIMUM STRENGTH 10 MG Tabs Generic drug:  Biotin Take 10 mg by mouth daily.   CRANBERRY PO Take 1 capsule by mouth daily.   EPINEPHrine 0.3 mg/0.3 mL Soaj injection Commonly known as:  EPI-PEN Inject 0.3 mLs (0.3 mg total) into the muscle once for 1 dose.   fexofenadine 180 MG tablet Commonly known as:  ALLEGRA Take 180 mg by mouth daily.   fluticasone 50 MCG/ACT nasal spray Commonly known as:  FLONASE USE 2 SPRAY(S) IN EACH NOSTRIL ONCE DAILY   fluticasone 50 MCG/ACT nasal spray Commonly known as:  FLONASE Place 1-2 sprays into both nostrils daily.   hydroxychloroquine 200 MG tablet Commonly known as:  PLAQUENIL TAKE 1 TABLET BY MOUTH ONCE DAILY   LASIX 40 MG tablet Generic drug:  furosemide Take 40 mg by mouth daily.   montelukast 10 MG tablet Commonly known as:  SINGULAIR Take 1 tablet (10 mg total) by mouth at bedtime.   pantoprazole 40 MG tablet Commonly known as:  PROTONIX Take 40 mg by mouth daily.   potassium chloride 10 MEQ tablet Commonly known as:  K-DUR,KLOR-CON TAKE 1 TABLET BY MOUTH TWICE DAILY   predniSONE 5 MG tablet Commonly known as:  DELTASONE 1 tablet at breakfast and taper as instructed   predniSONE 1 MG tablet Commonly known as:  DELTASONE Take 2 tablets (2 mg total) by mouth daily with lunch. Take as directed on tapering dose   traMADol 50 MG tablet Commonly known as:  ULTRAM TAKE 1 TABLET BY MOUTH THREE TIMES DAILY AS NEEDED   Vitamin D3 5000 units Caps Take 5,000 Units by mouth daily.       Known medication allergies: Allergies  Allergen Reactions  . Adhesive [Tape] Rash  . Celebrex [Celecoxib] Hives  . Ciprofibrate Nausea Only  . Cymbalta [Duloxetine Hcl] Swelling  . Gabitril [Tiagabine] Swelling  . Keflex  [Cephalexin] Nausea And Vomiting  . Lyrica [Pregabalin] Swelling  . Neurontin [Gabapentin] Swelling  . Nexium [Esomeprazole] Rash  . Nsaids Rash  . Penicillins Rash    Injection site reaction. Tolerated cefepime in past Has patient had a PCN reaction causing immediate rash, facial/tongue/throat swelling, SOB or lightheadedness with hypotension: No Has patient had a PCN reaction causing severe rash involving mucus membranes or skin necrosis: No Has patient had a PCN reaction that required hospitalization No Has patient had a PCN reaction occurring within the last 10 years: No If all of the above answers are "NO", then may proceed with Cephalosporin use.   . Shrimp [Shellfish Allergy] Anaphylaxis    Per patient "shrimp only"  . Sulfa Antibiotics Nausea And Vomiting  . Azactam [Aztreonam]     Hand swelling   . Ciprofloxacin Other (See Comments)    dizziness  . Zantac [Ranitidine Hcl]   .  Claritin-D 12 Hour [Loratadine-Pseudoephedrine Er] Anxiety     Physical examination: Blood pressure 112/72, pulse 67, resp. rate 18, SpO2 92 %.  General: Alert, interactive, in no acute distress. HEENT: PERRLA, mild edema of lower lids b/l, TMs pearly gray, turbinates mildly edematous without discharge, post-pharynx non erythematous. Neck: Supple without lymphadenopathy. Lungs: Clear to auscultation without wheezing, rhonchi or rales. {no increased work of breathing. CV: Normal S1, S2 without murmurs. Abdomen: Nondistended, nontender. Skin: Warm and dry, without lesions or rashes. Extremities:  No clubbing, cyanosis or edema. Neuro:   Grossly intact.  Diagnositics/Labs: Labs:  Component     Latest Ref Rng & Units 08/08/2018  CD19 Abs     12 - 645 /uL 51  Absolute CD3     622 - 2,402 /uL 1,355  Absolute CD 4 Helper     359 - 1,519 /uL 764  CD8 T Cell Abs     109 - 897 /uL 625  CD19 % B Cell     3.3 - 25.4 % 2.7 (L)  % CD 3 Pos. Lymph.     57.5 - 86.2 % 71.3  % CD 4 Pos. Lymph.      30.8 - 58.5 % 40.2  CD8 % Suppressor T Cell     12.0 - 35.5 % 32.9  CD4/CD8 Ratio     0.92 - 3.72 1.22  WBC     3.4 - 10.8 x10E3/uL 11.7 (H)  RBC     3.77 - 5.28 x10E6/uL 4.04  Hemoglobin     11.1 - 15.9 g/dL 12.7  HCT     34.0 - 46.6 % 38.3  MCV     79 - 97 fL 95  MCH     26.6 - 33.0 pg 31.4  MCHC     31.5 - 35.7 g/dL 33.2  RDW     12.3 - 15.4 % 12.9  Platelets     150 - 450 x10E3/uL 301  Neutrophils     Not Estab. % 79  Lymphs     Not Estab. % 17  Monocytes     Not Estab. % 4  Eos     Not Estab. % 0  Basos     Not Estab. % 0  NEUT#     1.4 - 7.0 x10E3/uL 9.1 (H)  Lymphocyte #     0.7 - 3.1 x10E3/uL 1.9  Monocytes Absolute     0.1 - 0.9 x10E3/uL 0.5  EOS (ABSOLUTE)     0.0 - 0.4 x10E3/uL 0.0  Basophils Absolute     0.0 - 0.2 x10E3/uL 0.0  Immature Granulocytes     Not Estab. % 0  Immature Grans (Abs)     0.0 - 0.1 x10E3/uL 0.0  Class Description      Comment  IgE (Immunoglobulin E), Serum     6 - 495 IU/mL 14  D Pteronyssinus IgE     Class 0 kU/L <0.10  D Farinae IgE     Class 0 kU/L <0.10  Cat Dander IgE     Class 0 kU/L <0.10  Dog Dander IgE     Class 0 kU/L <0.10  [place] Grass IgE     Class 0 kU/L <0.10  Timothy Grass IgE     Class 0 kU/L <0.10  Johnson Grass IgE     Class 0 kU/L <0.10  Cockroach, German IgE     Class 0 kU/L <0.10  Penicillium Chrysogen IgE     Class 0 kU/L <  0.10  Cladosporium Herbarum IgE     Class 0 kU/L <0.10  Aspergillus Fumigatus IgE     Class 0 kU/L <0.10  Alternaria Alternata IgE     Class 0 kU/L <0.10  Maple/Box Elder IgE     Class 0 kU/L <0.10  Common Silver Wendee Copp IgE     Class 0 kU/L <0.10  Cedar, [place] IgE     Class 0 kU/L <0.10  Oak, White IgE     Class 0 kU/L <0.10  Elm, American IgE     Class 0 kU/L <0.10  Cottonwood IgE     Class 0 kU/L <0.10  Pecan, Hickory IgE     Class 0 kU/L <0.10  White Mulberry IgE     Class 0 kU/L <0.10  Ragweed, Short IgE     Class 0 kU/L <0.10  Pigweed, Rough  IgE     Class 0 kU/L <0.10  Sheep Sorrel IgE Qn     Class 0 kU/L <0.10  Mouse Urine IgE     Class 0 kU/L <0.10  Pneumo Ab Type 1*     >1.3 ug/mL 6.9  Pneumo Ab Type 3*     >1.3 ug/mL 2.3  Pneumo Ab Type 4*     >1.3 ug/mL 1.3 (L)  Pneumo Ab Type 8*     >1.3 ug/mL 2.0  Pneumo Ab Type 9 (9N)*     >1.3 ug/mL 0.4 (L)  Pneumo Ab Type 12 (51F)*     >1.3 ug/mL 1.5  Pneumo Ab Type 14*     >1.3 ug/mL 1.1 (L)  Pneumo Ab Type 17 (88F)*     >1.3 ug/mL 1.2 (L)  Pneumo Ab Type 19 (88F)*     >1.3 ug/mL 2.6  Pneumo Ab Type 2*     >1.3 ug/mL 1.2 (L)  Pneumo Ab Type 20*     >1.3 ug/mL 7.2  Pneumo Ab Type 22 (74F)*     >1.3 ug/mL 3.6  Pneumo Ab Type 23 (54F)*     >1.3 ug/mL 5.0  Pneumo Ab Type 26 (6B)*     >1.3 ug/mL 2.4  Pneumo Ab Type 34 (10A)*     >1.3 ug/mL 5.7  Pneumo Ab Type 43 (11A)*     >1.3 ug/mL 1.2 (L)  Pneumo Ab Type 5*     >1.3 ug/mL 2.6  Pneumo Ab Type 51 (46F)*     >1.3 ug/mL 0.9 (L)  Pneumo Ab Type 54 (15B)*     >1.3 ug/mL 6.4  Pneumo Ab Type 56 (18C)*     >1.3 ug/mL 6.4  Pneumo Ab Type 57 (19A)*     >1.3 ug/mL 0.8 (L)  Pneumo Ab Type 68 (9V)*     >1.3 ug/mL 1.8  Pneumo Ab Type 70 (84F)*     >1.3 ug/mL 0.7 (L)  Glucose     65 - 99 mg/dL 112 (H)  BUN     8 - 27 mg/dL 25  Creatinine     0.57 - 1.00 mg/dL 1.08 (H)  GFR, Est Non African American     >59 mL/min/1.73 48 (L)  GFR, Est African American     >59 mL/min/1.73 55 (L)  BUN/Creatinine Ratio     12 - 28 23  Sodium     134 - 144 mmol/L 145 (H)  Potassium     3.5 - 5.2 mmol/L 4.4  Chloride     96 - 106 mmol/L 100  CO2     20 - 29 mmol/L  27  Calcium     8.7 - 10.3 mg/dL 10.5 (H)  Total Protein     6.0 - 8.5 g/dL 7.8  Albumin     3.5 - 4.7 g/dL 4.6  Globulin, Total     1.5 - 4.5 g/dL 3.2  Albumin/Globulin Ratio     1.2 - 2.2 1.4  Total Bilirubin     0.0 - 1.2 mg/dL 0.6  Alkaline Phosphatase     39 - 117 IU/L 57  AST     0 - 40 IU/L 19  ALT     0 - 32 IU/L 13  IgG (Immunoglobin G),  Serum     700 - 1,600 mg/dL 111  IgA/Immunoglobulin A, Serum     64 - 422 mg/dL 227  IgM (Immunoglobulin M), Srm     26 - 217 mg/dL 60  Tetanus Ab, IgG     <0.10 IU/mL 0.52  Diphtheria Ab     <0.10 IU/mL 0.26  Compl, Total (CH50)     42 - 999999 U/mL >60    Assessment and plan:   Non-allergic rhinitis and conjunctivitis   - environmental allergy testing via serum IgE levels was negative.     - you most likely have non-allergic (irritant) triggers that can cause similar symptoms as allergic triggers   - continue allegra 180mg  daily   - for nasal congestion use fluticasone (generic) 2 sprays each nostril daily as needed as this has not caused symptoms.   Recommend avoidance of Flonase (brand) and Nasacort (brand) as these have caused side effects.   - for nasal drainage or post-nasal drip recommend use of nasal antihistamine, Astelin, 1-2 sprays each nostril up 2 times a day.     - continue singulair 10mg  at bedtime   - stop the olopatadine  Recurrent infections   - immunocompetence work-up was reassuring  Shrimp allergy   - concerned there may be cross-contamination or potentially inhalation exposure to shrimp at your residential home.     - would like for you to have access to Epipen in case of a severe allergic reaction   Follow-up 4-6 months or sooner if needed  I appreciate the opportunity to take part in Early's care. Please do not hesitate to contact me with questions.  Sincerely,   Prudy Feeler, MD Allergy/Immunology [facility]

## 2018-10-09 NOTE — Patient Instructions (Addendum)
Non-allergic rhinitis and conjunctivitis   - environmental allergy testing via serum IgE levels was negative.     - you most likely have non-allergic triggers that can cause similar symptoms as allergic triggers   - continue allegra 180mg  daily   - for nasal congestion use fluticasone (generic) 2 sprays each nostril daily as needed as this has not caused symptoms.   Recommend avoidance of Flonase (brand) and Nasacort (brand) as these have caused side effects.   - for nasal drainage or post-nasal drip recommend use of nasal antihistamine, Astelin, 1-2 sprays each nostril up 2 times a day.     - continue singulair 10mg  at bedtime   - stop the olopatadine  Recurrent infections   - immunocompetence work-up was reassuring  Shrimp allergy   - concerned there may be cross-contamination or potentially inhalation exposure to shrimp at your residential home.     - would like for you to have access to Epipen in case of a severe allergic reaction   Follow-up 4-6 months or sooner if needed

## 2018-10-09 NOTE — Telephone Encounter (Signed)
Please advise on refill.

## 2018-10-09 NOTE — Progress Notes (Signed)
Patient comes in today for her monthly B 12 injection. B 12 was given in left deltoid. Patient tolerated well.

## 2018-10-17 ENCOUNTER — Other Ambulatory Visit: Payer: Self-pay | Admitting: Family Medicine

## 2018-10-24 ENCOUNTER — Other Ambulatory Visit: Payer: Self-pay | Admitting: Rheumatology

## 2018-10-24 NOTE — Telephone Encounter (Signed)
Last Visit: 08/15/18 Next Visit: 01/08/19 Labs: 07/30/18 stable PLQ eye exam:04/16/18 Normal.   Okay to refill per Dr. Estanislado Pandy

## 2018-10-31 ENCOUNTER — Other Ambulatory Visit: Payer: Self-pay | Admitting: Family Medicine

## 2018-10-31 DIAGNOSIS — J309 Allergic rhinitis, unspecified: Secondary | ICD-10-CM

## 2018-10-31 DIAGNOSIS — H1013 Acute atopic conjunctivitis, bilateral: Secondary | ICD-10-CM

## 2018-11-04 ENCOUNTER — Ambulatory Visit (INDEPENDENT_AMBULATORY_CARE_PROVIDER_SITE_OTHER): Payer: Self-pay

## 2018-11-04 ENCOUNTER — Encounter (INDEPENDENT_AMBULATORY_CARE_PROVIDER_SITE_OTHER): Payer: Self-pay | Admitting: Orthopedic Surgery

## 2018-11-04 ENCOUNTER — Ambulatory Visit (INDEPENDENT_AMBULATORY_CARE_PROVIDER_SITE_OTHER): Payer: Medicare Other | Admitting: Orthopedic Surgery

## 2018-11-04 VITALS — Ht 63.0 in | Wt 166.0 lb

## 2018-11-04 DIAGNOSIS — M79671 Pain in right foot: Secondary | ICD-10-CM | POA: Diagnosis not present

## 2018-11-04 NOTE — Progress Notes (Signed)
Office Visit Note   Patient: Eileen Wilkerson           Date of Birth: [birth year]           MRN: [id number] Visit Date: 11/04/2018              Requested by: Briscoe Deutscher, [address] [place], [place] [address] PCP: Briscoe Deutscher, DO  Chief Complaint  Patient presents with  . Right Foot - Pain      HPI: Patient is an 84 year old woman who complains of pain through the midfoot.  She states that she has pain also beneath the second and third metatarsal heads.  She states she is tried shoewear modifications without relief.  Assessment & Plan: Visit Diagnoses:  1. Pain in right foot     Plan: Recommended a stiff soled walking or Trail running sneaker.  Patient was given a prescription to work on Achilles stretching to stretch out the gastrocnemius muscle with her therapy sessions.    Follow-Up Instructions: Return if symptoms worsen or fail to improve.   Ortho Exam  Patient is alert, oriented, no adenopathy, well-dressed, normal affect, normal respiratory effort. Examination patient has a good dorsalis pedis pulse she does have Achilles contracture with dorsiflexion just short of neutral with her knee extended with her knee flexed she has dorsiflexion of about 30 degrees.  Patient is tender to palpation beneath the second and third metatarsal heads distraction across the Lisfranc complex is painful.  She also has some tenderness over the fifth metatarsal from her previous stress fracture.  Imaging: Xr Foot Complete Right  Result Date: 11/04/2018 3 view radiographs of the right foot shows a old stress fracture through the base of the fifth metatarsal she has no new stress fractures she does have a long second and third metatarsal with joint space narrowing through the midfoot consistent with arthritic changes through the midfoot.  No images are attached to the encounter.  Labs: Lab Results  Component Value Date   ESRSEDRATE 74 (H) 05/17/2017   CRP 2.0  05/17/2017   REPTSTATUS 02/03/2018 FINAL 02/03/2018   REPTSTATUS 02/06/2018 FINAL 02/03/2018   GRAMSTAIN  02/03/2018    MODERATE WBC PRESENT, PREDOMINANTLY MONONUCLEAR MODERATE SQUAMOUS EPITHELIAL CELLS PRESENT MODERATE GRAM POSITIVE RODS MODERATE GRAM POSITIVE COCCI IN PAIRS IN CLUSTERS FEW GRAM NEGATIVE COCCI IN PAIRS RARE GRAM NEGATIVE RODS    CULT  02/03/2018    ABUNDANT Consistent with normal respiratory flora. Performed at [facility], [place] [address], [place], [place] [address]      Lab Results  Component Value Date   ALBUMIN 4.6 08/08/2018   ALBUMIN 4.2 02/21/2018   ALBUMIN 4.1 02/13/2018    Body mass index is 29.41 kg/m.  Orders:  Orders Placed This Encounter  Procedures  . XR Foot Complete Right   No orders of the defined types were placed in this encounter.    Procedures: No procedures performed  Clinical Data: No additional findings.  ROS:  All other systems negative, except as noted in the HPI. Review of Systems  Objective: Vital Signs: Ht 5\' 3"  (1.6 m)   Wt 166 lb (75.3 kg)   BMI 29.41 kg/m   Specialty Comments:  No specialty comments available.  PMFS History: Patient Active Problem List   Diagnosis Date Noted  . B12 deficiency 03/11/2018  . AKI (acute kidney injury) ([place]) 02/14/2018  . Idiopathic chronic venous hypertension of both lower extremities with ulcer and inflammation ([place]) 09/18/2017  .  Bilateral lower extremity edema 06/30/2017  . Physical deconditioning 05/30/2017  . Immunosuppressed status ([place]) 05/30/2017  . Abnormal chest x-ray 05/17/2017  . Hypokalemia 04/25/2017  . Fibromyalgia 12/18/2016  . DJD (degenerative joint disease), cervical 12/18/2016  . Spondylosis of lumbar region without myelopathy or radiculopathy 12/18/2016  . Trigger finger, right middle finger 12/18/2016  . Primary osteoarthritis of both feet 12/18/2016  . Primary osteoarthritis of both knees 12/18/2016  . GERD (gastroesophageal reflux  disease) 12/18/2016  . Age-related osteoporosis without current pathological fracture 12/18/2016  . Rheumatoid arthritis ([place]) 12/28/2015  . Long term current use of systemic steroids, for RA 12/28/2015  . Lymphocytosis 11/13/2012   Past Medical History:  Diagnosis Date  . Adrenal failure ([place])   . Arthritis   . Asthma   . Cataract   . Closed nondisplaced fracture of fifth right metatarsal bone 09/18/2017  . Fibromyalgia 2008  . HCAP (healthcare-associated pneumonia) 02/03/2018  . Osteoporosis   . RA (rheumatoid arthritis) ([place])   . Recurrent upper respiratory infection (URI)   . Sepsis due to urinary tract infection ([place]) 01/18/2018  . Urticaria     Family History  Problem Relation Age of Onset  . Heart attack Maternal Grandmother   . Heart attack Paternal Grandfather   . Breast cancer Mother   . Diabetes Father   . Heart disease Father   . Allergic rhinitis Neg Hx   . Asthma Neg Hx   . Eczema Neg Hx   . Urticaria Neg Hx     Past Surgical History:  Procedure Laterality Date  . BACK SURGERY    . BILATERAL CARPAL TUNNEL RELEASE    . CATARACT EXTRACTION, BILATERAL  2004  . CERVICAL FUSION  2011,2010,2008  . HEEL SPUR SURGERY  2004  . ROTATOR CUFF REPAIR    . TONSILLECTOMY AND ADENOIDECTOMY  1947  . TOTAL SHOULDER ARTHROPLASTY     Social History   Occupational History  . Not on file  Tobacco Use  . Smoking status: Former Smoker    Packs/day: 0.75    Years: 10.00    Pack years: 7.50    Types: Cigarettes    Last attempt to quit: 1968    Years since quitting: 51.9  . Smokeless tobacco: Never Used  Substance and Sexual Activity  . Alcohol use: No  . Drug use: No  . Sexual activity: Not Currently

## 2018-11-12 ENCOUNTER — Ambulatory Visit (INDEPENDENT_AMBULATORY_CARE_PROVIDER_SITE_OTHER): Payer: Medicare Other

## 2018-11-12 ENCOUNTER — Encounter: Payer: Self-pay | Admitting: Family Medicine

## 2018-11-12 DIAGNOSIS — E538 Deficiency of other specified B group vitamins: Secondary | ICD-10-CM | POA: Diagnosis not present

## 2018-11-12 MED ORDER — CYANOCOBALAMIN 1000 MCG/ML IJ SOLN
1000.0000 ug | Freq: Once | INTRAMUSCULAR | Status: AC
  Administered 2018-11-12: 1000 ug via INTRAMUSCULAR

## 2018-11-12 NOTE — Progress Notes (Addendum)
Per orders of Dr. Juleen China, injection of Vitamin B12 1000 mcg given right deltoid IM given by Kevan Ny, CMA Patient tolerated injection well.  Pt will schedule her next injection for 1 month out.

## 2018-11-15 ENCOUNTER — Other Ambulatory Visit: Payer: Self-pay

## 2018-11-15 MED ORDER — CYANOCOBALAMIN 1000 MCG/ML IJ SOLN: INTRAMUSCULAR | 0 refills | Status: DC

## 2018-11-15 MED ORDER — "SYRINGE 25G X 1"" 3 ML MISC": 1.0000 "application " | 0 refills | Status: DC

## 2018-11-18 ENCOUNTER — Other Ambulatory Visit: Payer: Self-pay | Admitting: Family Medicine

## 2018-11-18 NOTE — Telephone Encounter (Signed)
Copied from [place] [id number]. Topic: Quick Communication - Rx Refill/Question >> Nov 18, 2018  9:18 AM Rayann Heman wrote: Medication: traMADol (ULTRAM) 50 MG tablet [[id number]]   Has the patient contacted their pharmacy? yes Preferred Pharmacy (with phone number or street name):[facility], [place] [phone] (Phone) [phone] (Fax)   Agent: Please be advised that RX refills may take up to 3 business days. We ask that you follow-up with your pharmacy.

## 2018-11-19 MED ORDER — TRAMADOL HCL 50 MG PO TABS: 50.0000 mg | ORAL_TABLET | Freq: Three times a day (TID) | ORAL | 0 refills | Status: DC | PRN

## 2018-11-19 NOTE — Telephone Encounter (Signed)
Last OV 07/09/18 Last refill 10/09/18 #90/0 Next OV - not scheduled

## 2018-11-21 ENCOUNTER — Other Ambulatory Visit: Payer: Self-pay

## 2018-11-22 ENCOUNTER — Other Ambulatory Visit: Payer: Self-pay | Admitting: Family Medicine

## 2018-11-25 ENCOUNTER — Other Ambulatory Visit: Payer: Self-pay

## 2018-11-25 DIAGNOSIS — Z79899 Other long term (current) drug therapy: Secondary | ICD-10-CM

## 2018-11-26 LAB — CBC WITH DIFFERENTIAL/PLATELET
Absolute Monocytes: 410 cells/uL (ref 200–950)
Basophils Absolute: 46 cells/uL (ref 0–200)
Basophils Relative: 0.4 %
Eosinophils Absolute: 34 cells/uL (ref 15–500)
Eosinophils Relative: 0.3 %
HCT: 39.4 % (ref 35.0–45.0)
Hemoglobin: 13.1 g/dL (ref 11.7–15.5)
Lymphs Abs: 1744 cells/uL (ref 850–3900)
MCH: 31.3 pg (ref 27.0–33.0)
MCHC: 33.2 g/dL (ref 32.0–36.0)
MCV: 94 fL (ref 80.0–100.0)
MPV: 10.2 fL (ref 7.5–12.5)
Monocytes Relative: 3.6 %
Neutro Abs: 9166 cells/uL — ABNORMAL HIGH (ref 1500–7800)
Neutrophils Relative %: 80.4 %
Platelets: 298 10*3/uL (ref 140–400)
RBC: 4.19 10*6/uL (ref 3.80–5.10)
RDW: 12.1 % (ref 11.0–15.0)
Total Lymphocyte: 15.3 %
WBC: 11.4 10*3/uL — ABNORMAL HIGH (ref 3.8–10.8)

## 2018-11-26 LAB — COMPLETE METABOLIC PANEL WITH GFR
AG Ratio: 1.3 (calc) (ref 1.0–2.5)
ALT: 10 U/L (ref 6–29)
AST: 16 U/L (ref 10–35)
Albumin: 4.3 g/dL (ref 3.6–5.1)
Alkaline phosphatase (APISO): 48 U/L (ref 33–130)
BUN/Creatinine Ratio: 21 (calc) (ref 6–22)
BUN: 24 mg/dL (ref 7–25)
CO2: 29 mmol/L (ref 20–32)
Calcium: 10.4 mg/dL (ref 8.6–10.4)
Chloride: 100 mmol/L (ref 98–110)
Creat: 1.16 mg/dL — ABNORMAL HIGH (ref 0.60–0.88)
GFR, Est African American: 50 mL/min/{1.73_m2} — ABNORMAL LOW (ref >=60)
GFR, Est Non African American: 43 mL/min/{1.73_m2} — ABNORMAL LOW (ref >=60)
Globulin: 3.3 g/dL (calc) (ref 1.9–3.7)
Glucose, Bld: 104 mg/dL — ABNORMAL HIGH (ref 65–99)
Potassium: 4.8 mmol/L (ref 3.5–5.3)
Sodium: 139 mmol/L (ref 135–146)
Total Bilirubin: 0.5 mg/dL (ref 0.2–1.2)
Total Protein: 7.6 g/dL (ref 6.1–8.1)

## 2018-11-26 NOTE — Progress Notes (Signed)
Labs are stable.  Please forward copies to her PCP.

## 2018-11-29 ENCOUNTER — Other Ambulatory Visit: Payer: Self-pay | Admitting: Endocrinology

## 2018-12-02 NOTE — Telephone Encounter (Signed)
Last filled by another provider refill or refuse please advise

## 2018-12-02 NOTE — Telephone Encounter (Signed)
She needs to have this filled through her rheumatologist

## 2018-12-06 ENCOUNTER — Other Ambulatory Visit: Payer: Self-pay | Admitting: Endocrinology

## 2018-12-06 ENCOUNTER — Telehealth: Payer: Self-pay | Admitting: Rheumatology

## 2018-12-06 MED ORDER — PREDNISONE 1 MG PO TABS: 2.0000 mg | ORAL_TABLET | Freq: Every day | ORAL | 1 refills | Status: DC

## 2018-12-06 NOTE — Telephone Encounter (Signed)
Patient informed. 

## 2018-12-06 NOTE — Telephone Encounter (Signed)
Ok to refill please advise 

## 2018-12-06 NOTE — Telephone Encounter (Signed)
Patient called requesting prescription refill of Prednisone 1 mg to be sent to [facility].

## 2018-12-06 NOTE — Telephone Encounter (Signed)
Previous telephone note indicates that patient needs to call rheumatologist for any refills since she is not coming back

## 2018-12-06 NOTE — Telephone Encounter (Signed)
Last Visit: 08/15/18 Next Visit: 01/08/19  Okay to refill per Dr. Estanislado Pandy

## 2018-12-11 HISTORY — PX: RADIOFREQUENCY ABLATION: SHX2290

## 2018-12-16 ENCOUNTER — Encounter: Payer: Self-pay | Admitting: Family Medicine

## 2018-12-16 ENCOUNTER — Other Ambulatory Visit: Payer: Self-pay | Admitting: Family Medicine

## 2018-12-16 NOTE — Telephone Encounter (Signed)
Ok to fill 

## 2018-12-18 ENCOUNTER — Ambulatory Visit: Payer: Medicare Other

## 2018-12-24 ENCOUNTER — Other Ambulatory Visit: Payer: Self-pay | Admitting: Endocrinology

## 2018-12-24 ENCOUNTER — Other Ambulatory Visit: Payer: Self-pay | Admitting: Family Medicine

## 2018-12-24 NOTE — Telephone Encounter (Signed)
Please refill if appropriate

## 2018-12-24 NOTE — Telephone Encounter (Signed)
Patient informed Rx denied

## 2018-12-24 NOTE — Telephone Encounter (Signed)
As mentioned before she needs to get this from her rheumatologist

## 2018-12-25 NOTE — Progress Notes (Addendum)
Office Visit Note  Patient: Eileen Wilkerson             Date of Birth: 07/13/1932           MRN: [id number]             PCP: Briscoe Deutscher, DO Referring: Briscoe Deutscher, DO Visit Date: 01/08/2019 Occupation: @GUAROCC @  Subjective:  Pain in hands and feet.    History of Present Illness: Eileen Wilkerson is a 85 y.o. female 3 of rheumatoid arthritis, osteoarthritis, fibromyalgia and osteoporosis.  She states she continues to have some discomfort in her hands.  She states her fibromyalgia is also flaring with increased pain all over.  She has been experiencing hyperalgesia.  He states she had problems with Achilles tendinitis.  She was seen by Dr. Sharol Given.  She states she was given band for plantar fasciitis as well.  Activities of Daily Living:  Patient reports morning stiffness for 2 hours.   Patient Denies nocturnal pain.  Difficulty dressing/grooming: Reports Difficulty climbing stairs: Reports Difficulty getting out of chair: Reports Difficulty using hands for taps, buttons, cutlery, and/or writing: Reports  Review of Systems  Constitutional: Positive for fatigue. Negative for night sweats, weight gain and weight loss.  HENT: Positive for mouth dryness. Negative for mouth sores, trouble swallowing, trouble swallowing and nose dryness.   Eyes: Positive for dryness. Negative for pain, redness and visual disturbance.  Respiratory: Negative for cough, shortness of breath and difficulty breathing.   Cardiovascular: Negative for chest pain, palpitations, hypertension, irregular heartbeat and swelling in legs/feet.  Gastrointestinal: Negative for blood in stool, constipation and diarrhea.  Endocrine: Negative for increased urination.  Genitourinary: Negative for vaginal dryness.  Musculoskeletal: Positive for arthralgias, joint pain, myalgias, morning stiffness and myalgias. Negative for joint swelling, muscle weakness and muscle tenderness.  Skin: Negative for color change, rash,  hair loss, skin tightness, ulcers and sensitivity to sunlight.  Allergic/Immunologic: Negative for susceptible to infections.  Neurological: Negative for dizziness, memory loss, night sweats and weakness.  Hematological: Negative for swollen glands.  Psychiatric/Behavioral: Negative for depressed mood and sleep disturbance. The patient is not nervous/anxious.     PMFS History:  Patient Active Problem List   Diagnosis Date Noted  . Abdominal pain 12/30/2018  . B12 deficiency 03/11/2018  . AKI (acute kidney injury) ([place]) 02/14/2018  . Idiopathic chronic venous hypertension of both lower extremities with ulcer and inflammation ([place]) 09/18/2017  . Bilateral lower extremity edema 06/30/2017  . Physical deconditioning 05/30/2017  . Immunosuppressed status ([place]) 05/30/2017  . Abnormal chest x-ray 05/17/2017  . Hypokalemia 04/25/2017  . Fibromyalgia 12/18/2016  . DJD (degenerative joint disease), cervical 12/18/2016  . Spondylosis of lumbar region without myelopathy or radiculopathy 12/18/2016  . Trigger finger, right middle finger 12/18/2016  . Primary osteoarthritis of both feet 12/18/2016  . Primary osteoarthritis of both knees 12/18/2016  . GERD (gastroesophageal reflux disease) 12/18/2016  . Age-related osteoporosis without current pathological fracture 12/18/2016  . Rheumatoid arthritis ([place]) 12/28/2015  . Long term current use of systemic steroids, for RA 12/28/2015  . Lymphocytosis 11/13/2012    Past Medical History:  Diagnosis Date  . Adrenal failure ([place])   . Arthritis   . Asthma   . Cataract   . Closed nondisplaced fracture of fifth right metatarsal bone 09/18/2017  . Fibromyalgia 2008  . HCAP (healthcare-associated pneumonia) 02/03/2018  . Osteoporosis   . RA (rheumatoid arthritis) ([place])   . Recurrent upper respiratory infection (URI)   . Sepsis  due to urinary tract infection ([place]) 01/18/2018  . Urticaria     Family History  Problem Relation Age of Onset  . Heart  attack Maternal Grandmother   . Heart attack Paternal Grandfather   . Breast cancer Mother   . Diabetes Father   . Heart disease Father   . Allergic rhinitis Neg Hx   . Asthma Neg Hx   . Eczema Neg Hx   . Urticaria Neg Hx    Past Surgical History:  Procedure Laterality Date  . BACK SURGERY    . BILATERAL CARPAL TUNNEL RELEASE    . CATARACT EXTRACTION, BILATERAL  2004  . CERVICAL FUSION  2011,2010,2008  . HEEL SPUR SURGERY  2004  . ROTATOR CUFF REPAIR    . TONSILLECTOMY AND ADENOIDECTOMY  1947  . TOTAL SHOULDER ARTHROPLASTY     Social History   Social History Narrative  . Not on file   Immunization History  Administered Date(s) Administered  . Influenza, High Dose Seasonal PF 08/16/2018  . Influenza,inj,Quad PF,6+ Mos 08/11/2016  . Influenza-Unspecified 11/12/2017  . PPD Test 03/01/2018  . Pneumococcal Conjugate-13 06/26/2017     Objective: Vital Signs: BP 122/80 (BP Location: Left Arm, Patient Position: Sitting, Cuff Size: Normal)   Pulse 74   Resp 14   Ht 5\' 4"  (1.626 m)   Wt 171 lb (77.6 kg)   BMI 29.35 kg/m    Physical Exam Vitals signs and nursing note reviewed.  Constitutional:      Appearance: She is well-developed.  HENT:     Head: Normocephalic and atraumatic.  Eyes:     Conjunctiva/sclera: Conjunctivae normal.  Neck:     Musculoskeletal: Normal range of motion.  Cardiovascular:     Rate and Rhythm: Normal rate and regular rhythm.     Heart sounds: Normal heart sounds.  Pulmonary:     Effort: Pulmonary effort is normal.     Breath sounds: Normal breath sounds.  Abdominal:     General: Bowel sounds are normal.     Palpations: Abdomen is soft.  Lymphadenopathy:     Cervical: No cervical adenopathy.  Skin:    General: Skin is warm and dry.     Capillary Refill: Capillary refill takes less than 2 seconds.  Neurological:     Mental Status: She is alert and oriented to person, place, and time.  Psychiatric:        Behavior: Behavior normal.       Musculoskeletal Exam: Patient has limited range of motion of her cervical spine and lumbar spine.  Shoulder joints elbow joints and wrist joints with good range of motion.  She is showed no synovitis on examination.  She has DIP and PIP thickening with some bilateral first PIP subluxation.  Hip joints knee joints ankles were in good range of motion.  She has osteoarthritic changes in her feet.  CDAI Exam: CDAI Score: 0.8  Patient Global Assessment: 6 (mm); Provider Global Assessment: 2 (mm) Swollen: 0 ; Tender: 0  Joint Exam   Not documented   There is currently no information documented on the homunculus. Go to the Rheumatology activity and complete the homunculus joint exam.  Investigation: No additional findings.  Imaging: No results found.  Recent Labs: Lab Results  Component Value Date   WBC 9.6 12/30/2018   HGB 13.6 12/30/2018   PLT 286.0 12/30/2018   NA 141 12/30/2018   K 3.8 12/30/2018   CL 101 12/30/2018   CO2 31 12/30/2018   GLUCOSE  108 (H) 12/30/2018   BUN 20 12/30/2018   CREATININE 1.01 12/30/2018   BILITOT 0.5 12/30/2018   ALKPHOS 50 12/30/2018   AST 19 12/30/2018   ALT 11 12/30/2018   PROT 8.3 12/30/2018   ALBUMIN 4.7 12/30/2018   CALCIUM 10.5 12/30/2018   GFRAA 50 (L) 11/25/2018    Speciality Comments: PLQ eye exam:10/22/18 Normal. [facility] Follow up in 6 months.  Procedures:  No procedures performed Allergies: Adhesive [tape]; Celebrex [celecoxib]; Ciprofibrate; Cymbalta [duloxetine hcl]; Gabitril [tiagabine]; Keflex [cephalexin]; Lyrica [pregabalin]; Neurontin [gabapentin]; Nexium [esomeprazole]; Nsaids; Penicillins; Shrimp [shellfish allergy]; Sulfa antibiotics; Azactam [aztreonam]; Azelastine hcl; Ciprofloxacin; Methotrexate derivatives; Nasacort [triamcinolone]; Olopatadine; Zantac [ranitidine hcl]; and Claritin-d 12 hour [loratadine-pseudoephedrine er]   Assessment / Plan:     Visit Diagnoses: Rheumatoid arthritis involving  multiple sites with positive rheumatoid factor (HCC)-patient has no synovitis on examination.  She is on low-dose Plaquenil which she is tolerating well.  She is unable to come off prednisone due to adrenal insufficiency.  High risk medication use -  ( discontinued MTX in 12/2017 due to frequent infections)Plaquenil 200 mg daily and prednisone 7 mg daily.  Last Plaquenil eye exam normal on 10/22/2018.  Most recent CBC/CMP within normal limits except elevated white blood cells and slightly decreased GFR on 12/30/2018.  Will monitor CBC/CMP every 5 months and standing orders placed.  She received flu vaccine in September and previously have Prevnar 13.  Recommend Pneumovax 23 and Shingrix vaccine as indicated.- Plan: CBC with Differential/Platelet, COMPLETE METABOLIC PANEL WITH GFR  Primary osteoarthritis of both hands-she has severe osteoarthritis in her hands which causes discomfort.  She is also developed subluxation of the bilateral first PIP joints.  Joint protection muscle strengthening was discussed.  Primary osteoarthritis of both knees-she has osteoarthritis in her bilateral knee joints which causes discomfort.  Visco supplement injections had been helpful.  Primary osteoarthritis of both feet-has discomfort in her feet.  Age-related osteoporosis without current pathological fracture-patient reports that her bone density shows only osteopenia.  Fibromyalgia-she continues to have some generalized pain and discomfort.  Need for regular exercise was discussed.  DDD (degenerative disc disease), cervical-she has limited range of motion of her cervical spine.  Long term current use of systemic steroids, for RA -patient states she had been on prednisone for more than 20 years and then developed adrenal insufficiency.  Now she is on prednisone due to adrenal insufficiency.  Adrenal insufficiency ([place]) - She is on Prednisone 7 mg daily.   History of fatigue    Orders: Orders Placed This  Encounter  Procedures  . CBC with Differential/Platelet  . COMPLETE METABOLIC PANEL WITH GFR   No orders of the defined types were placed in this encounter.   Face-to-face time spent with patient was 30 minutes. Greater than 50% of time was spent in counseling and coordination of care.  Follow-Up Instructions: Return in about 5 months (around 06/09/2019) for Rheumatoid arthritis, Osteoarthritis.   Bo Merino, MD  Note - This record has been created using Editor, commissioning.  Chart creation errors have been sought, but may not always  have been located. Such creation errors do not reflect on  the standard of medical care.

## 2018-12-28 ENCOUNTER — Other Ambulatory Visit: Payer: Self-pay | Admitting: Rheumatology
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
## 2018-12-30 ENCOUNTER — Ambulatory Visit: Payer: Medicare Other | Admitting: Family Medicine

## 2018-12-30 ENCOUNTER — Other Ambulatory Visit: Payer: Self-pay | Admitting: Family Medicine

## 2018-12-30 ENCOUNTER — Encounter: Payer: Self-pay | Admitting: Family Medicine

## 2018-12-30 VITALS — BP 130/74 | HR 84 | Temp 98.7°F | Ht 63.0 in | Wt 171.0 lb

## 2018-12-30 DIAGNOSIS — E7800 Pure hypercholesterolemia, unspecified: Secondary | ICD-10-CM

## 2018-12-30 DIAGNOSIS — E538 Deficiency of other specified B group vitamins: Secondary | ICD-10-CM

## 2018-12-30 DIAGNOSIS — R350 Frequency of micturition: Secondary | ICD-10-CM | POA: Diagnosis not present

## 2018-12-30 DIAGNOSIS — D849 Immunodeficiency, unspecified: Secondary | ICD-10-CM

## 2018-12-30 DIAGNOSIS — K219 Gastro-esophageal reflux disease without esophagitis: Secondary | ICD-10-CM

## 2018-12-30 DIAGNOSIS — M0579 Rheumatoid arthritis with rheumatoid factor of multiple sites without organ or systems involvement: Secondary | ICD-10-CM

## 2018-12-30 DIAGNOSIS — Z7952 Long term (current) use of systemic steroids: Secondary | ICD-10-CM

## 2018-12-30 DIAGNOSIS — M797 Fibromyalgia: Secondary | ICD-10-CM | POA: Diagnosis not present

## 2018-12-30 DIAGNOSIS — D899 Disorder involving the immune mechanism, unspecified: Secondary | ICD-10-CM

## 2018-12-30 DIAGNOSIS — R109 Unspecified abdominal pain: Secondary | ICD-10-CM

## 2018-12-30 LAB — POC URINALSYSI DIPSTICK (AUTOMATED)
Bilirubin, UA: NEGATIVE
Glucose, UA: NEGATIVE
Ketones, UA: NEGATIVE
Leukocytes, UA: NEGATIVE
Nitrite, UA: NEGATIVE
Protein, UA: NEGATIVE
Spec Grav, UA: 1.015 (ref 1.010–1.025)
Urobilinogen, UA: 0.2 E.U./dL
pH, UA: 6 (ref 5.0–8.0)

## 2018-12-30 MED ORDER — LANSOPRAZOLE 30 MG PO CPDR: 30.0000 mg | DELAYED_RELEASE_CAPSULE | Freq: Every day | ORAL | 3 refills | Status: DC

## 2018-12-30 MED ORDER — TRAMADOL HCL 50 MG PO TABS: 50.0000 mg | ORAL_TABLET | Freq: Four times a day (QID) | ORAL | 3 refills | Status: DC | PRN

## 2018-12-30 NOTE — Patient Instructions (Signed)
Health Maintenance Due  Topic Date Due  . PNA vac Low Risk Adult (2 of 2 - PPSV23) 06/26/2018

## 2018-12-30 NOTE — Telephone Encounter (Signed)
Last Visit: 08/15/18 Next Visit: 01/08/19  Okay to refill per Dr. Estanislado Pandy

## 2018-12-31 ENCOUNTER — Encounter: Payer: Self-pay | Admitting: Family Medicine

## 2018-12-31 LAB — LIPID PANEL
Cholesterol: 156 mg/dL (ref 0–200)
HDL: 69.8 mg/dL (ref >39.00)
LDL Cholesterol: 57 mg/dL (ref 0–99)
NonHDL: 86.54
Total CHOL/HDL Ratio: 2
Triglycerides: 146 mg/dL (ref 0.0–149.0)
VLDL: 29.2 mg/dL (ref 0.0–40.0)

## 2018-12-31 LAB — COMPREHENSIVE METABOLIC PANEL
ALT: 11 U/L (ref 0–35)
AST: 19 U/L (ref 0–37)
Albumin: 4.7 g/dL (ref 3.5–5.2)
Alkaline Phosphatase: 50 U/L (ref 39–117)
BUN: 20 mg/dL (ref 6–23)
CO2: 31 mEq/L (ref 19–32)
Calcium: 10.5 mg/dL (ref 8.4–10.5)
Chloride: 101 mEq/L (ref 96–112)
Creatinine, Ser: 1.01 mg/dL (ref 0.40–1.20)
GFR: 52.18 mL/min — ABNORMAL LOW (ref >60.00)
Glucose, Bld: 108 mg/dL — ABNORMAL HIGH (ref 70–99)
Potassium: 3.8 mEq/L (ref 3.5–5.1)
Sodium: 141 mEq/L (ref 135–145)
Total Bilirubin: 0.5 mg/dL (ref 0.2–1.2)
Total Protein: 8.3 g/dL (ref 6.0–8.3)

## 2018-12-31 LAB — CBC WITH DIFFERENTIAL/PLATELET
Basophils Absolute: 0.1 10*3/uL (ref 0.0–0.1)
Basophils Relative: 0.7 % (ref 0.0–3.0)
Eosinophils Absolute: 0 10*3/uL (ref 0.0–0.7)
Eosinophils Relative: 0.2 % (ref 0.0–5.0)
HCT: 41.3 % (ref 36.0–46.0)
Hemoglobin: 13.6 g/dL (ref 12.0–15.0)
Lymphocytes Relative: 18.3 % (ref 12.0–46.0)
Lymphs Abs: 1.8 10*3/uL (ref 0.7–4.0)
MCHC: 32.9 g/dL (ref 30.0–36.0)
MCV: 95.4 fl (ref 78.0–100.0)
Monocytes Absolute: 0.5 10*3/uL (ref 0.1–1.0)
Monocytes Relative: 4.7 % (ref 3.0–12.0)
Neutro Abs: 7.3 10*3/uL (ref 1.4–7.7)
Neutrophils Relative %: 76.1 % (ref 43.0–77.0)
Platelets: 286 10*3/uL (ref 150.0–400.0)
RBC: 4.33 Mil/uL (ref 3.87–5.11)
RDW: 13.7 % (ref 11.5–15.5)
WBC: 9.6 10*3/uL (ref 4.0–10.5)

## 2018-12-31 LAB — URINE CULTURE
MICRO NUMBER:: 78035
Result:: NO GROWTH
SPECIMEN QUALITY:: ADEQUATE

## 2019-01-08 ENCOUNTER — Ambulatory Visit: Payer: Medicare Other | Admitting: Rheumatology

## 2019-01-08 ENCOUNTER — Encounter: Payer: Self-pay | Admitting: Rheumatology

## 2019-01-08 VITALS — BP 122/80 | HR 74 | Resp 14 | Ht 64.0 in | Wt 171.0 lb

## 2019-01-08 DIAGNOSIS — Z87898 Personal history of other specified conditions: Secondary | ICD-10-CM

## 2019-01-08 DIAGNOSIS — M170 Bilateral primary osteoarthritis of knee: Secondary | ICD-10-CM | POA: Diagnosis not present

## 2019-01-08 DIAGNOSIS — M19041 Primary osteoarthritis, right hand: Secondary | ICD-10-CM

## 2019-01-08 DIAGNOSIS — M19072 Primary osteoarthritis, left ankle and foot: Secondary | ICD-10-CM

## 2019-01-08 DIAGNOSIS — Z7952 Long term (current) use of systemic steroids: Secondary | ICD-10-CM

## 2019-01-08 DIAGNOSIS — Z79899 Other long term (current) drug therapy: Secondary | ICD-10-CM | POA: Diagnosis not present

## 2019-01-08 DIAGNOSIS — M797 Fibromyalgia: Secondary | ICD-10-CM

## 2019-01-08 DIAGNOSIS — M19071 Primary osteoarthritis, right ankle and foot: Secondary | ICD-10-CM

## 2019-01-08 DIAGNOSIS — M5030 Other cervical disc degeneration, unspecified cervical region: Secondary | ICD-10-CM

## 2019-01-08 DIAGNOSIS — M19042 Primary osteoarthritis, left hand: Secondary | ICD-10-CM

## 2019-01-08 DIAGNOSIS — M0579 Rheumatoid arthritis with rheumatoid factor of multiple sites without organ or systems involvement: Secondary | ICD-10-CM

## 2019-01-08 DIAGNOSIS — M810 Age-related osteoporosis without current pathological fracture: Secondary | ICD-10-CM

## 2019-01-08 DIAGNOSIS — Z8679 Personal history of other diseases of the circulatory system: Secondary | ICD-10-CM

## 2019-01-08 DIAGNOSIS — E2740 Unspecified adrenocortical insufficiency: Secondary | ICD-10-CM

## 2019-01-08 NOTE — Patient Instructions (Signed)
Standing Labs We placed an order today for your standing lab work.    Please come back and get your standing labs in June  We have open lab Monday through Friday from 8:30-11:30 AM and 1:30-4:00 PM  at the office of Dr. Bo Merino.   You may experience shorter wait times on Monday and Friday afternoons. The office is located at [address], [place], [place],  [address] No appointment is necessary.   Labs are drawn by [facility].  You may receive a bill from [facility] for your lab work.  If you wish to have your labs drawn at another location, please call the office 24 hours in advance to send orders.  If you have any questions regarding directions or hours of operation,  please call [phone].   Just as a reminder please drink plenty of water prior to coming for your lab work. Thanks!

## 2019-01-09 ENCOUNTER — Encounter: Payer: Self-pay | Admitting: Family Medicine

## 2019-01-12 ENCOUNTER — Encounter: Payer: Self-pay | Admitting: Rheumatology

## 2019-01-13 ENCOUNTER — Ambulatory Visit: Payer: Medicare Other | Admitting: Family Medicine

## 2019-01-13 ENCOUNTER — Ambulatory Visit (INDEPENDENT_AMBULATORY_CARE_PROVIDER_SITE_OTHER): Payer: Medicare Other

## 2019-01-13 ENCOUNTER — Encounter: Payer: Self-pay | Admitting: Family Medicine

## 2019-01-13 ENCOUNTER — Ambulatory Visit: Payer: Self-pay | Admitting: Family Medicine

## 2019-01-13 VITALS — BP 140/62 | HR 61 | Temp 97.4°F | Ht 64.0 in | Wt 171.0 lb

## 2019-01-13 DIAGNOSIS — R079 Chest pain, unspecified: Secondary | ICD-10-CM

## 2019-01-13 LAB — CBC WITH DIFFERENTIAL/PLATELET
Basophils Absolute: 0.1 10*3/uL (ref 0.0–0.1)
Basophils Relative: 0.7 % (ref 0.0–3.0)
Eosinophils Absolute: 0.1 10*3/uL (ref 0.0–0.7)
Eosinophils Relative: 1.3 % (ref 0.0–5.0)
HCT: 39.9 % (ref 36.0–46.0)
Hemoglobin: 13 g/dL (ref 12.0–15.0)
Lymphocytes Relative: 40.7 % (ref 12.0–46.0)
Lymphs Abs: 3.6 10*3/uL (ref 0.7–4.0)
MCHC: 32.6 g/dL (ref 30.0–36.0)
MCV: 94.7 fl (ref 78.0–100.0)
Monocytes Absolute: 0.9 10*3/uL (ref 0.1–1.0)
Monocytes Relative: 10.5 % (ref 3.0–12.0)
Neutro Abs: 4.1 10*3/uL (ref 1.4–7.7)
Neutrophils Relative %: 46.8 % (ref 43.0–77.0)
Platelets: 252 10*3/uL (ref 150.0–400.0)
RBC: 4.22 Mil/uL (ref 3.87–5.11)
RDW: 13.5 % (ref 11.5–15.5)
WBC: 8.8 10*3/uL (ref 4.0–10.5)

## 2019-01-13 LAB — COMPREHENSIVE METABOLIC PANEL
ALT: 14 U/L (ref 0–35)
AST: 18 U/L (ref 0–37)
Albumin: 4.3 g/dL (ref 3.5–5.2)
Alkaline Phosphatase: 44 U/L (ref 39–117)
BUN: 24 mg/dL — ABNORMAL HIGH (ref 6–23)
CO2: 30 mEq/L (ref 19–32)
Calcium: 10.2 mg/dL (ref 8.4–10.5)
Chloride: 104 mEq/L (ref 96–112)
Creatinine, Ser: 0.9 mg/dL (ref 0.40–1.20)
GFR: 59.6 mL/min — ABNORMAL LOW (ref >60.00)
Glucose, Bld: 88 mg/dL (ref 70–99)
Potassium: 3.3 mEq/L — ABNORMAL LOW (ref 3.5–5.1)
Sodium: 142 mEq/L (ref 135–145)
Total Bilirubin: 0.4 mg/dL (ref 0.2–1.2)
Total Protein: 7.8 g/dL (ref 6.0–8.3)

## 2019-01-13 LAB — LIPASE: Lipase: 14 U/L (ref 11.0–59.0)

## 2019-01-13 LAB — TROPONIN I: TNIDX: 0 ug/l (ref 0.00–0.06)

## 2019-01-13 MED ORDER — ASPIRIN 81 MG PO CHEW
81.0000 mg | CHEWABLE_TABLET | Freq: Once | ORAL | Status: AC
  Administered 2019-01-13: 81 mg via ORAL

## 2019-01-13 NOTE — Telephone Encounter (Signed)
See note
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
## 2019-01-13 NOTE — Patient Instructions (Addendum)
My recommendation today was to go to the hospital for further evaluation of your chest pain.  You declined this option-we specifically discussed the risk of heart attack or even death resulting from this decision.  We are going to rule out an active heart attack by checking a troponin today.  Please please please pick up all phone calls today due to the importance of this test.  We are also going get a chest x-ray and some other bloodwork.   Our team is going to verify we have the right cell # listed for you  Make sure no rash with chaperone   If you have worsening pain or new symptoms- please call 911 immediately   Please stop by lab before you go If you do not have mychart- we will call you about results within 5 business days of [place] receiving them.  If you have mychart- we will send your results within 3 business days of [place] receiving them.  If abnormal or we want to clarify a result, we will call or mychart you to make sure you receive the message.  If you have questions or concerns or don't hear within 5-7 days, please send [place] a message or call us.

## 2019-01-13 NOTE — Telephone Encounter (Signed)
Pt seen by Dr.Hunter

## 2019-01-13 NOTE — Telephone Encounter (Signed)
Pt. Reports she saw Dr. Juleen China 2 weeks ago with chest pain. She was started on Prevacid. States pain "never went away." Pain is on left side of chest under breast. Does not radiate.No nausea or sweating. Has shortness of breath which she says is not related. Thinks she could have "silent pneumonia." Appointment made for today. No availability with Dr. Juleen China. Instructed if symptoms worsen, to go to ED. Verbalizes understanding. Reason for Disposition . [1] Chest pain lasting <= 5 minutes AND [2] NO chest pain or cardiac symptoms now(Exceptions: pains lasting a few seconds)  Answer Assessment - Initial Assessment Questions 1. LOCATION: "Where does it hurt?"       Left side under breast 2. RADIATION: "Does the pain go anywhere else?" (e.g., into neck, jaw, arms, back)     No 3. ONSET: "When did the chest pain begin?" (Minutes, hours or days)      Started 2 weeks ago 4. PATTERN "Does the pain come and go, or has it been constant since it started?"  "Does it get worse with exertion?"      Constant, but worse 5. DURATION: "How long does it last" (e.g., seconds, minutes, hours)     Constant 6. SEVERITY: "How bad is the pain?"  (e.g., Scale 1-10; mild, moderate, or severe)    - MILD (1-3): doesn't interfere with normal activities     - MODERATE (4-7): interferes with normal activities or awakens from sleep    - SEVERE (8-10): excruciating pain, unable to do any normal activities        8 7. CARDIAC RISK FACTORS: "Do you have any history of heart problems or risk factors for heart disease?" (e.g., prior heart attack, angina; high blood pressure, diabetes, being overweight, high cholesterol, smoking, or strong family history of heart disease)     No 8. PULMONARY RISK FACTORS: "Do you have any history of lung disease?"  (e.g., blood clots in lung, asthma, emphysema, birth control pills)     Pneumonia 3-4 times, Asthma as a child 9. CAUSE: "What do you think is causing the chest pain?"     Maybe  pneumonia 10. OTHER SYMPTOMS: "Do you have any other symptoms?" (e.g., dizziness, nausea, vomiting, sweating, fever, difficulty breathing, cough)       Some shortness of breath 11. PREGNANCY: "Is there any chance you are pregnant?" "When was your last menstrual period?"       No  Protocols used: CHEST PAIN-A-AH

## 2019-01-14 ENCOUNTER — Encounter: Payer: Self-pay | Admitting: Family Medicine

## 2019-01-14 ENCOUNTER — Ambulatory Visit: Payer: Medicare Other | Admitting: Family Medicine

## 2019-01-14 VITALS — BP 134/62 | HR 77 | Temp 98.1°F | Wt 170.0 lb

## 2019-01-14 DIAGNOSIS — D899 Disorder involving the immune mechanism, unspecified: Secondary | ICD-10-CM | POA: Diagnosis not present

## 2019-01-14 DIAGNOSIS — M0579 Rheumatoid arthritis with rheumatoid factor of multiple sites without organ or systems involvement: Secondary | ICD-10-CM | POA: Diagnosis not present

## 2019-01-14 DIAGNOSIS — R05 Cough: Secondary | ICD-10-CM

## 2019-01-14 DIAGNOSIS — Z7952 Long term (current) use of systemic steroids: Secondary | ICD-10-CM

## 2019-01-14 DIAGNOSIS — R059 Cough, unspecified: Secondary | ICD-10-CM

## 2019-01-14 DIAGNOSIS — M940 Chondrocostal junction syndrome [Tietze]: Secondary | ICD-10-CM

## 2019-01-14 DIAGNOSIS — D849 Immunodeficiency, unspecified: Secondary | ICD-10-CM

## 2019-01-14 MED ORDER — PREDNISONE 10 MG PO TABS: 10.0000 mg | ORAL_TABLET | Freq: Every day | ORAL | 0 refills | Status: DC

## 2019-01-14 MED ORDER — DOXYCYCLINE HYCLATE 100 MG PO TABS: 100.0000 mg | ORAL_TABLET | Freq: Two times a day (BID) | ORAL | 0 refills | Status: DC

## 2019-01-14 NOTE — Progress Notes (Signed)
Eileen Wilkerson is a 85 y.o. female is here for follow up.  History of Present Illness:   HPI: Patient presents for follow-up after visit with Dr. Yong Wilkerson yesterday.  She presented with chest wall pain and cough.  His thorough work-up included labs, including troponin, chest x-ray, and EKG.  She does have a history of pneumonia that caused hospital stay.  She has rheumatoid arthritis and is on chronic daily prednisone.  She denies any new fevers, chest pain, shortness of breath, wheeze, lower extremity edema.  Dg Chest 2 View  Result Date: 01/13/2019 CLINICAL DATA:  Intermittent chest pain for the past 2-3 weeks. EXAM: CHEST - 2 VIEW COMPARISON:  Chest x-ray dated February 13, 2018. FINDINGS: The heart size and mediastinal contours are within normal limits. Normal pulmonary vascularity. Mild diffuse peribronchial thickening. Mild subsegmental atelectasis at the left lung base. No focal consolidation, pleural effusion, or pneumothorax. No acute osseous abnormality. Prior lower cervical ACDF. IMPRESSION: 1. Bronchitic changes.  Left basilar atelectasis. Electronically Signed   By: Eileen Wilkerson M.D.   On: 01/13/2019 17:10   Health Maintenance Due  Topic Date Due  . PNA vac Low Risk Adult (2 of 2 - PPSV23) 06/26/2018   Depression screen [facility] 2/9 12/30/2018 07/09/2018 07/09/2018  Decreased Interest 0 0 0  Down, Depressed, Hopeless 0 0 0  PHQ - 2 Score 0 0 0  Altered sleeping 0 - 0  Tired, decreased energy 3 - 0  Change in appetite 1 - 0  Feeling bad or failure about yourself  0 - 0  Trouble concentrating 0 - 0  Moving slowly or fidgety/restless 0 - 0  Suicidal thoughts 0 - 0  PHQ-9 Score 4 - 0  Difficult doing work/chores Not difficult at all - Not difficult at all   PMHx, SurgHx, SocialHx, FamHx, Medications, and Allergies were reviewed in the Visit Navigator and updated as appropriate.   Patient Active Problem List   Diagnosis Date Noted  . B12 deficiency 03/11/2018  . Idiopathic  chronic venous hypertension of both lower extremities with ulcer and inflammation ([place]) 09/18/2017  . Bilateral lower extremity edema 06/30/2017  . Physical deconditioning 05/30/2017  . Immunosuppressed status ([place]) 05/30/2017  . Abnormal chest x-ray 05/17/2017  . Fibromyalgia 12/18/2016  . DJD (degenerative joint disease), cervical 12/18/2016  . Spondylosis of lumbar region without myelopathy or radiculopathy 12/18/2016  . Trigger finger, right middle finger 12/18/2016  . Primary osteoarthritis of both feet 12/18/2016  . Primary osteoarthritis of both knees 12/18/2016  . GERD (gastroesophageal reflux disease) 12/18/2016  . Age-related osteoporosis without current pathological fracture 12/18/2016  . Rheumatoid arthritis ([place]) 12/28/2015  . Long term current use of systemic steroids, for RA 12/28/2015  . Lymphocytosis 11/13/2012   Social History   Tobacco Use  . Smoking status: Former Smoker    Packs/day: 0.75    Years: 10.00    Pack years: 7.50    Types: Cigarettes    Last attempt to quit: 1968    Years since quitting: 52.1  . Smokeless tobacco: Never Used  Substance Use Topics  . Alcohol use: No  . Drug use: No   Current Medications and Allergies   .  acetaminophen (TYLENOL) 500 MG tablet, Take 500 mg by mouth every 6 (six) hours as needed., Disp: , Rfl:  .  atorvastatin (LIPITOR) 20 MG tablet, TAKE 1 TABLET BY MOUTH AT BEDTIME AS NEEDED, Disp: 90 tablet, Rfl: 0 .  Biotin (BIOTIN MAXIMUM STRENGTH) 10 MG TABS,  Take 10 mg by mouth daily., Disp: , Rfl:  .  Cholecalciferol (VITAMIN D3) 5000 units CAPS, Take 5,000 Units by mouth daily., Disp: , Rfl:  .  CRANBERRY PO, Take 1 capsule by mouth daily., Disp: , Rfl:  .  cyanocobalamin (,VITAMIN B-12,) 1000 MCG/ML injection, 1000 mcg injection once per month., Disp: 6 mL, Rfl: 0 .  diclofenac sodium (VOLTAREN) 1 % GEL, APPLY 3 GRAM TOPICALLY TO THE 3 LARGEST JOINTS THREE TIMES DAILY AS NEEDED, Disp: , Rfl: 5 .  EPINEPHrine 0.3 mg/0.3  mL IJ SOAJ injection, INJECT 0.3MLS INTO THE MUSCLE ONCE FOR A ONE TIME DOSE, Disp: , Rfl: 2 .  fexofenadine (ALLEGRA) 180 MG tablet, Take 180 mg by mouth daily., Disp: , Rfl:  .  fluticasone (FLONASE) 50 MCG/ACT nasal spray, USE 2 SPRAY(S) IN EACH NOSTRIL ONCE DAILY, Disp: 16 g, Rfl: 3 .  furosemide (LASIX) 40 MG tablet, Take 40 mg by mouth daily., Disp: , Rfl:  .  hydroxychloroquine (PLAQUENIL) 200 MG tablet, TAKE 1 TABLET BY MOUTH ONCE DAILY, Disp: 90 tablet, Rfl: 0 .  KLOR-CON M10 10 MEQ tablet, TAKE 1 TABLET BY MOUTH TWICE DAILY, Disp: 60 tablet, Rfl: 0 .  lansoprazole (PREVACID) 30 MG capsule, Take 1 capsule (30 mg total) by mouth daily at 12 noon., Disp: 90 capsule, Rfl: 3 .  montelukast (SINGULAIR) 10 MG tablet, TAKE 1 TABLET BY MOUTH AT BEDTIME, Disp: 30 tablet, Rfl: 3 .  pantoprazole (PROTONIX) 40 MG tablet, TAKE 1 TABLET BY MOUTH ONCE DAILY, Disp: 30 tablet, Rfl: 0 .  predniSONE (DELTASONE) 1 MG tablet, Take 2 tablets (2 mg total) by mouth daily with lunch. Take as directed on tapering dose, Disp: 60 tablet, Rfl: 1 .  predniSONE (DELTASONE) 5 MG tablet, TAKE 1 TABLET BY MOUTH WITH BREAKFAST AND  TAPER  AS  INSTRUCTED, Disp: 30 tablet, Rfl: 0 .  Syringe/Needle, Disp, (SYRINGE 3CC/25GX1") 25G X 1" 3 ML MISC, 1 application by Does not apply route once a week., Disp: 12 each, Rfl: 0 .  traMADol (ULTRAM) 50 MG tablet, Take 1 tablet (50 mg total) by mouth 4 (four) times daily as needed., Disp: 120 tablet, Rfl: 3   Allergies  Allergen Reactions  . Adhesive [Tape] Rash  . Celebrex [Celecoxib] Hives  . Ciprofibrate Nausea Only  . Cymbalta [Duloxetine Hcl] Swelling  . Gabitril [Tiagabine] Swelling  . Keflex [Cephalexin] Nausea And Vomiting  . Lyrica [Pregabalin] Swelling  . Neurontin [Gabapentin] Swelling  . Nexium [Esomeprazole] Rash  . Nsaids Rash  . Penicillins Rash    Injection site reaction. Tolerated cefepime in past Has patient had a PCN reaction causing immediate rash,  facial/tongue/throat swelling, SOB or lightheadedness with hypotension: No Has patient had a PCN reaction causing severe rash involving mucus membranes or skin necrosis: No Has patient had a PCN reaction that required hospitalization No Has patient had a PCN reaction occurring within the last 10 years: No If all of the above answers are "NO", then may proceed with Cephalosporin use.   . Shrimp [Shellfish Allergy] Anaphylaxis    Per patient "shrimp only"  . Sulfa Antibiotics Nausea And Vomiting  . Azactam [Aztreonam]     Hand swelling   . Azelastine Hcl     Rash   . Ciprofloxacin Other (See Comments)    dizziness  . Methotrexate Derivatives   . Nasacort [Triamcinolone]     Dizzy   . Olopatadine Other (See Comments)    Pain and lethargy   .  Zantac [Ranitidine Hcl]   . Claritin-D 12 Hour [Loratadine-Pseudoephedrine Er] Anxiety   Review of Systems   Pertinent items are noted in the HPI. Otherwise, a complete ROS is negative.  Vitals   Vitals:   01/14/19 1547  BP: 134/62  Pulse: 77  Temp: 98.1 F (36.7 C)  TempSrc: Oral  SpO2: 93%  Weight: 170 lb (77.1 kg)     Body mass index is 29.18 kg/m.  Physical Exam   Physical Exam Vitals signs and nursing note reviewed.  Constitutional:      General: She is not in acute distress.    Appearance: Normal appearance.  HENT:     Head: Normocephalic and atraumatic.     Right Ear: Tympanic membrane, ear canal and external ear normal.     Left Ear: Tympanic membrane, ear canal and external ear normal.     Nose: Nose normal.     Mouth/Throat:     Mouth: Mucous membranes are moist.  Eyes:     Pupils: Pupils are equal, round, and reactive to light.  Neck:     Musculoskeletal: Normal range of motion and neck supple.  Cardiovascular:     Rate and Rhythm: Normal rate and regular rhythm.  Pulmonary:     Effort: Pulmonary effort is normal.     Breath sounds: Decreased breath sounds and rhonchi present.  Abdominal:     Palpations:  Abdomen is soft.  Skin:    General: Skin is warm.  Neurological:     Mental Status: She is alert.  Psychiatric:        Behavior: Behavior normal.    Results for orders placed or performed in visit on 01/13/19  CBC with Differential/Platelet  Result Value Ref Range   WBC 8.8 4.0 - 10.5 K/uL   RBC 4.22 3.87 - 5.11 Mil/uL   Hemoglobin 13.0 12.0 - 15.0 g/dL   HCT 39.9 36.0 - 46.0 %   MCV 94.7 78.0 - 100.0 fl   MCHC 32.6 30.0 - 36.0 g/dL   RDW 13.5 11.5 - 15.5 %   Platelets 252.0 150.0 - 400.0 K/uL   Neutrophils Relative % 46.8 43.0 - 77.0 %   Lymphocytes Relative 40.7 12.0 - 46.0 %   Monocytes Relative 10.5 3.0 - 12.0 %   Eosinophils Relative 1.3 0.0 - 5.0 %   Basophils Relative 0.7 0.0 - 3.0 %   Neutro Abs 4.1 1.4 - 7.7 K/uL   Lymphs Abs 3.6 0.7 - 4.0 K/uL   Monocytes Absolute 0.9 0.1 - 1.0 K/uL   Eosinophils Absolute 0.1 0.0 - 0.7 K/uL   Basophils Absolute 0.1 0.0 - 0.1 K/uL  Comprehensive metabolic panel  Result Value Ref Range   Sodium 142 135 - 145 mEq/L   Potassium 3.3 (L) 3.5 - 5.1 mEq/L   Chloride 104 96 - 112 mEq/L   CO2 30 19 - 32 mEq/L   Glucose, Bld 88 70 - 99 mg/dL   BUN 24 (H) 6 - 23 mg/dL   Creatinine, Ser 0.90 0.40 - 1.20 mg/dL   Total Bilirubin 0.4 0.2 - 1.2 mg/dL   Alkaline Phosphatase 44 39 - 117 U/L   AST 18 0 - 37 U/L   ALT 14 0 - 35 U/L   Total Protein 7.8 6.0 - 8.3 g/dL   Albumin 4.3 3.5 - 5.2 g/dL   Calcium 10.2 8.4 - 10.5 mg/dL   GFR 59.60 (L) >60.00 mL/min  Lipase  Result Value Ref Range   Lipase 14.0 11.0 -  59.0 U/L  Troponin I  Result Value Ref Range   TNIDX 0.00 0.00 - 0.06 ug/l   EKG: unchanged from previous tracings.  Assessment and Plan   Eileen Wilkerson was seen today for follow-up.  Diagnoses and all orders for this visit:  Cough Because the patient has a history of hospitalization from pneumonia and is on chronic immunosuppression for rheumatoid arthritis, will go ahead and treat with doxycycline 100 mg p.o. twice daily for 7  days.  We will advance her prednisone to 15 mg daily for the next few weeks and she may decrease after.  She will make rheumatologist aware of this change.  We did discuss red flags for following up or going to the emergency room. -     predniSONE (DELTASONE) 10 MG tablet; Take 1 tablet (10 mg total) by mouth daily with breakfast. -     doxycycline (VIBRA-TABS) 100 MG tablet; Take 1 tablet (100 mg total) by mouth 2 (two) times daily.  Immunosuppressed status ([place])  Rheumatoid arthritis involving multiple sites with positive rheumatoid factor ([place])  Long term current use of systemic steroids, for RA  Costochondritis   . Orders and follow up as documented in [place], reviewed diet, exercise and weight control, cardiovascular risk and specific lipid/LDL goals reviewed, reviewed medications and side effects in detail.  . Reviewed expectations re: course of current medical issues. . Outlined signs and symptoms indicating need for more acute intervention. . Patient verbalized understanding and all questions were answered. . Patient received an After Visit Summary.  Briscoe Deutscher, DO [place] 01/19/2019

## 2019-01-20 ENCOUNTER — Encounter: Payer: Self-pay | Admitting: Family Medicine

## 2019-01-23 ENCOUNTER — Other Ambulatory Visit: Payer: Self-pay | Admitting: Rheumatology

## 2019-01-23 ENCOUNTER — Other Ambulatory Visit: Payer: Self-pay | Admitting: Family Medicine

## 2019-01-23 DIAGNOSIS — R600 Localized edema: Secondary | ICD-10-CM

## 2019-01-23 NOTE — Telephone Encounter (Signed)
Last Visit: 01/08/19 Next visit: 06/10/19 Labs: 01/13/19 Potassium 3.3 BUN 24 GFR 59.6  PLQ eye exam:10/22/18 Normal.   Okay to refill per Dr. Estanislado Pandy

## 2019-01-24 ENCOUNTER — Ambulatory Visit: Payer: Medicare Other | Admitting: Physician Assistant

## 2019-01-24 ENCOUNTER — Telehealth: Payer: Self-pay | Admitting: Family Medicine

## 2019-01-24 ENCOUNTER — Ambulatory Visit (INDEPENDENT_AMBULATORY_CARE_PROVIDER_SITE_OTHER): Payer: Medicare Other

## 2019-01-24 ENCOUNTER — Encounter: Payer: Self-pay | Admitting: Physician Assistant

## 2019-01-24 VITALS — BP 136/80 | HR 66 | Temp 97.7°F | Ht 64.0 in | Wt 166.2 lb

## 2019-01-24 DIAGNOSIS — R509 Fever, unspecified: Secondary | ICD-10-CM

## 2019-01-24 DIAGNOSIS — R5383 Other fatigue: Secondary | ICD-10-CM

## 2019-01-24 LAB — CBC WITH DIFFERENTIAL/PLATELET
Basophils Absolute: 0 10*3/uL (ref 0.0–0.1)
Basophils Relative: 0.1 % (ref 0.0–3.0)
Eosinophils Absolute: 0 10*3/uL (ref 0.0–0.7)
Eosinophils Relative: 0.1 % (ref 0.0–5.0)
HCT: 42.4 % (ref 36.0–46.0)
Hemoglobin: 13.7 g/dL (ref 12.0–15.0)
Lymphocytes Relative: 10.8 % — ABNORMAL LOW (ref 12.0–46.0)
Lymphs Abs: 1.5 10*3/uL (ref 0.7–4.0)
MCHC: 32.3 g/dL (ref 30.0–36.0)
MCV: 94.8 fl (ref 78.0–100.0)
Monocytes Absolute: 0.5 10*3/uL (ref 0.1–1.0)
Monocytes Relative: 3.5 % (ref 3.0–12.0)
Neutro Abs: 12.2 10*3/uL — ABNORMAL HIGH (ref 1.4–7.7)
Neutrophils Relative %: 85.5 % — ABNORMAL HIGH (ref 43.0–77.0)
Platelets: 339 10*3/uL (ref 150.0–400.0)
RBC: 4.47 Mil/uL (ref 3.87–5.11)
RDW: 14 % (ref 11.5–15.5)
WBC: 14.3 10*3/uL — ABNORMAL HIGH (ref 4.0–10.5)

## 2019-01-24 LAB — COMPREHENSIVE METABOLIC PANEL
ALT: 14 U/L (ref 0–35)
AST: 18 U/L (ref 0–37)
Albumin: 4.4 g/dL (ref 3.5–5.2)
Alkaline Phosphatase: 58 U/L (ref 39–117)
BUN: 38 mg/dL — ABNORMAL HIGH (ref 6–23)
CO2: 29 mEq/L (ref 19–32)
Calcium: 10.2 mg/dL (ref 8.4–10.5)
Chloride: 100 mEq/L (ref 96–112)
Creatinine, Ser: 1.15 mg/dL (ref 0.40–1.20)
GFR: 44.91 mL/min — ABNORMAL LOW (ref >60.00)
Glucose, Bld: 106 mg/dL — ABNORMAL HIGH (ref 70–99)
Potassium: 4 mEq/L (ref 3.5–5.1)
Sodium: 139 mEq/L (ref 135–145)
Total Bilirubin: 1 mg/dL (ref 0.2–1.2)
Total Protein: 7.8 g/dL (ref 6.0–8.3)

## 2019-01-24 LAB — POC URINALSYSI DIPSTICK (AUTOMATED)
Bilirubin, UA: NEGATIVE
Blood, UA: NEGATIVE
Glucose, UA: NEGATIVE
Ketones, UA: NEGATIVE
Leukocytes, UA: NEGATIVE
Nitrite, UA: NEGATIVE
Protein, UA: NEGATIVE
Spec Grav, UA: 1.015 (ref 1.010–1.025)
Urobilinogen, UA: 0.2 E.U./dL
pH, UA: 6 (ref 5.0–8.0)

## 2019-01-24 LAB — POCT INFLUENZA A/B
Influenza A, POC: NEGATIVE
Influenza B, POC: NEGATIVE

## 2019-01-24 NOTE — Patient Instructions (Signed)
It was great to see you!  We will be in touch with lab and urine results.  Please follow-up with Dr. Juleen China or myself on Monday.  Please go to the ER if anything concerning over the weekend.  Take care,  Inda Coke PA-C

## 2019-01-24 NOTE — Progress Notes (Signed)
Eileen Wilkerson is a 85 y.o. female here for a follow up of a pre-existing problem.  I acted as a Education administrator for [employer], PA-C Anselmo Pickler, LPN  History of Present Illness:   Chief Complaint  Patient presents with  . low grade fever    HPI  Pt here for follow up on Bronchitis. Pt  She is here to make sure that she doesn't have PNA. says she is feeling a little better since yesterday. Pt is not coughing but has been running low grade fevers every night. Pt finished antibiotic, doxycycline, today. She is on an increased prednisone dose of 15 mg daily per PCP note. She states that it normally causes her to have increase in appetite but this isn't the case.  Has been dealing with costochondritis. Had really thorough work up on 01/14/19 that was essentially negative.  Denies: worsening chest pain, unusual SOB, changes in vision  BP Readings from Last 3 Encounters:  01/24/19 136/80  01/14/19 134/62  01/13/19 140/62      Past Medical History:  Diagnosis Date  . Adrenal failure ([place])   . Arthritis   . Asthma   . Cataract   . Closed nondisplaced fracture of fifth right metatarsal bone 09/18/2017  . Fibromyalgia 2008  . HCAP (healthcare-associated pneumonia) 02/03/2018  . Osteoporosis   . RA (rheumatoid arthritis) ([place])   . Recurrent upper respiratory infection (URI)   . Sepsis due to urinary tract infection ([place]) 01/18/2018  . Urticaria      Social History   Socioeconomic History  . Marital status: Widowed    Spouse name: Not on file  . Number of children: Not on file  . Years of education: Not on file  . Highest education level: Not on file  Occupational History  . Not on file  Social Needs  . Financial resource strain: Not on file  . Food insecurity:    Worry: Not on file    Inability: Not on file  . Transportation needs:    Medical: Not on file    Non-medical: Not on file  Tobacco Use  . Smoking status: Former Smoker    Packs/day: 0.75    Years: 10.00   Pack years: 7.50    Types: Cigarettes    Last attempt to quit: 1968    Years since quitting: 52.1  . Smokeless tobacco: Never Used  Substance and Sexual Activity  . Alcohol use: No  . Drug use: No  . Sexual activity: Not Currently  Lifestyle  . Physical activity:    Days per week: Not on file    Minutes per session: Not on file  . Stress: Not on file  Relationships  . Social connections:    Talks on phone: Not on file    Gets together: Not on file    Attends religious service: Not on file    Active member of club or organization: Not on file    Attends meetings of clubs or organizations: Not on file    Relationship status: Not on file  . Intimate partner violence:    Fear of current or ex partner: Not on file    Emotionally abused: Not on file    Physically abused: Not on file    Forced sexual activity: Not on file  Other Topics Concern  . Not on file  Social History Narrative  . Not on file    Past Surgical History:  Procedure Laterality Date  . BACK SURGERY    .  BILATERAL CARPAL TUNNEL RELEASE    . CATARACT EXTRACTION, BILATERAL  2004  . CERVICAL FUSION  2011,2010,2008  . HEEL SPUR SURGERY  2004  . ROTATOR CUFF REPAIR    . TONSILLECTOMY AND ADENOIDECTOMY  1947  . TOTAL SHOULDER ARTHROPLASTY      Family History  Problem Relation Age of Onset  . Heart attack Maternal Grandmother   . Heart attack Paternal Grandfather   . Breast cancer Mother   . Diabetes Father   . Heart disease Father   . Allergic rhinitis Neg Hx   . Asthma Neg Hx   . Eczema Neg Hx   . Urticaria Neg Hx     Allergies  Allergen Reactions  . Adhesive [Tape] Rash  . Celebrex [Celecoxib] Hives  . Ciprofibrate Nausea Only  . Cymbalta [Duloxetine Hcl] Swelling  . Gabitril [Tiagabine] Swelling  . Keflex [Cephalexin] Nausea And Vomiting  . Lyrica [Pregabalin] Swelling  . Neurontin [Gabapentin] Swelling  . Nexium [Esomeprazole] Rash  . Nsaids Rash  . Penicillins Rash    Injection site  reaction. Tolerated cefepime in past Has patient had a PCN reaction causing immediate rash, facial/tongue/throat swelling, SOB or lightheadedness with hypotension: No Has patient had a PCN reaction causing severe rash involving mucus membranes or skin necrosis: No Has patient had a PCN reaction that required hospitalization No Has patient had a PCN reaction occurring within the last 10 years: No If all of the above answers are "NO", then may proceed with Cephalosporin use.   . Shrimp [Shellfish Allergy] Anaphylaxis    Per patient "shrimp only"  . Sulfa Antibiotics Nausea And Vomiting  . Azactam [Aztreonam]     Hand swelling   . Azelastine Hcl     Rash   . Ciprofloxacin Other (See Comments)    dizziness  . Methotrexate Derivatives   . Nasacort [Triamcinolone]     Dizzy   . Olopatadine Other (See Comments)    Pain and lethargy   . Zantac [Ranitidine Hcl]   . Claritin-D 12 Hour [Loratadine-Pseudoephedrine Er] Anxiety    Current Medications:   Current Outpatient Medications:  .  acetaminophen (TYLENOL) 500 MG tablet, Take 500 mg by mouth every 6 (six) hours as needed., Disp: , Rfl:  .  atorvastatin (LIPITOR) 20 MG tablet, TAKE 1 TABLET BY MOUTH AT BEDTIME AS NEEDED, Disp: 90 tablet, Rfl: 0 .  Biotin (BIOTIN MAXIMUM STRENGTH) 10 MG TABS, Take 10 mg by mouth daily., Disp: , Rfl:  .  Cholecalciferol (VITAMIN D3) 5000 units CAPS, Take 5,000 Units by mouth daily., Disp: , Rfl:  .  CRANBERRY PO, Take 1 capsule by mouth daily., Disp: , Rfl:  .  cyanocobalamin (,VITAMIN B-12,) 1000 MCG/ML injection, 1000 mcg injection once per month., Disp: 6 mL, Rfl: 0 .  diclofenac sodium (VOLTAREN) 1 % GEL, APPLY 3 GRAM TOPICALLY TO THE 3 LARGEST JOINTS THREE TIMES DAILY AS NEEDED, Disp: , Rfl: 5 .  EPINEPHrine 0.3 mg/0.3 mL IJ SOAJ injection, INJECT 0.3MLS INTO THE MUSCLE ONCE FOR A ONE TIME DOSE, Disp: , Rfl: 2 .  fexofenadine (ALLEGRA) 180 MG tablet, Take 180 mg by mouth daily., Disp: , Rfl:  .   fluticasone (FLONASE) 50 MCG/ACT nasal spray, USE 2 SPRAY(S) IN EACH NOSTRIL ONCE DAILY, Disp: 16 g, Rfl: 3 .  furosemide (LASIX) 40 MG tablet, Take 40 mg by mouth daily., Disp: , Rfl:  .  furosemide (LASIX) 80 MG tablet, TAKE 1 TABLET BY MOUTH ONCE DAILY, Disp: 30 tablet, Rfl:  0 .  hydroxychloroquine (PLAQUENIL) 200 MG tablet, TAKE 1 TABLET BY MOUTH ONCE DAILY, Disp: 90 tablet, Rfl: 0 .  lansoprazole (PREVACID) 30 MG capsule, Take 1 capsule (30 mg total) by mouth daily at 12 noon., Disp: 90 capsule, Rfl: 3 .  montelukast (SINGULAIR) 10 MG tablet, TAKE 1 TABLET BY MOUTH AT BEDTIME, Disp: 30 tablet, Rfl: 3 .  potassium chloride (K-DUR,KLOR-CON) 10 MEQ tablet, TAKE 1 TABLET BY MOUTH TWICE DAILY, Disp: 60 tablet, Rfl: 0 .  predniSONE (DELTASONE) 10 MG tablet, Take 1 tablet (10 mg total) by mouth daily with breakfast., Disp: 30 tablet, Rfl: 0 .  predniSONE (DELTASONE) 5 MG tablet, TAKE 1 TABLET BY MOUTH WITH BREAKFAST AND  TAPER  AS  INSTRUCTED, Disp: 30 tablet, Rfl: 0 .  Syringe/Needle, Disp, (SYRINGE 3CC/25GX1") 25G X 1" 3 ML MISC, 1 application by Does not apply route once a week., Disp: 12 each, Rfl: 0 .  traMADol (ULTRAM) 50 MG tablet, Take 1 tablet (50 mg total) by mouth 4 (four) times daily as needed., Disp: 120 tablet, Rfl: 3   Review of Systems:   Review of Systems  Constitutional: Positive for malaise/fatigue. Negative for chills, fever and weight loss.  Respiratory: Positive for cough. Negative for shortness of breath.   Cardiovascular: Negative for chest pain, orthopnea, claudication and leg swelling.  Gastrointestinal: Negative for heartburn, nausea and vomiting.  Genitourinary: Negative for dysuria.  Neurological: Negative for dizziness, tingling and headaches.    Vitals:   Vitals:   01/24/19 1407  BP: 136/80  Pulse: 66  Temp: 97.7 F (36.5 C)  TempSrc: Oral  SpO2: 95%  Weight: 166 lb 4 oz (75.4 kg)  Height: 5\' 4"  (1.626 m)     Body mass index is 28.54 kg/m.  Physical  Exam:   Physical Exam Vitals signs and nursing note reviewed.  Constitutional:      General: She is not in acute distress.    Appearance: She is well-developed. She is not ill-appearing or toxic-appearing.  HENT:     Head: Normocephalic and atraumatic.     Right Ear: Tympanic membrane, ear canal and external ear normal. Tympanic membrane is not erythematous, retracted or bulging.     Left Ear: Tympanic membrane, ear canal and external ear normal. Tympanic membrane is not erythematous, retracted or bulging.     Nose: Nose normal.     Right Sinus: No maxillary sinus tenderness or frontal sinus tenderness.     Left Sinus: No maxillary sinus tenderness or frontal sinus tenderness.     Mouth/Throat:     Pharynx: Uvula midline. No posterior oropharyngeal erythema.  Eyes:     General: Lids are normal.     Conjunctiva/sclera: Conjunctivae normal.  Neck:     Trachea: Trachea normal.  Cardiovascular:     Rate and Rhythm: Normal rate and regular rhythm.     Pulses: Normal pulses.     Heart sounds: Normal heart sounds, S1 normal and S2 normal.     Comments: No LE edema Pulmonary:     Effort: Pulmonary effort is normal.     Breath sounds: Normal breath sounds. No decreased breath sounds, wheezing, rhonchi or rales.  Lymphadenopathy:     Cervical: No cervical adenopathy.  Skin:    General: Skin is warm and dry.  Neurological:     Mental Status: She is alert.     GCS: GCS eye subscore is 4. GCS verbal subscore is 5. GCS motor subscore is 6.  Psychiatric:  Speech: Speech normal.        Behavior: Behavior normal. Behavior is cooperative.    Results for orders placed or performed in visit on 01/24/19  Comprehensive metabolic panel  Result Value Ref Range   Sodium 139 135 - 145 mEq/L   Potassium 4.0 3.5 - 5.1 mEq/L   Chloride 100 96 - 112 mEq/L   CO2 29 19 - 32 mEq/L   Glucose, Bld 106 (H) 70 - 99 mg/dL   BUN 38 (H) 6 - 23 mg/dL   Creatinine, Ser 1.15 0.40 - 1.20 mg/dL   Total  Bilirubin 1.0 0.2 - 1.2 mg/dL   Alkaline Phosphatase 58 39 - 117 U/L   AST 18 0 - 37 U/L   ALT 14 0 - 35 U/L   Total Protein 7.8 6.0 - 8.3 g/dL   Albumin 4.4 3.5 - 5.2 g/dL   Calcium 10.2 8.4 - 10.5 mg/dL   GFR 44.91 (L) >60.00 mL/min  CBC with Differential/Platelet  Result Value Ref Range   WBC 14.3 (H) 4.0 - 10.5 K/uL   RBC 4.47 3.87 - 5.11 Mil/uL   Hemoglobin 13.7 12.0 - 15.0 g/dL   HCT 42.4 36.0 - 46.0 %   MCV 94.8 78.0 - 100.0 fl   MCHC 32.3 30.0 - 36.0 g/dL   RDW 14.0 11.5 - 15.5 %   Platelets 339.0 150.0 - 400.0 K/uL   Neutrophils Relative % 85.5 Repeated and verified X2. (H) 43.0 - 77.0 %   Lymphocytes Relative 10.8 (L) 12.0 - 46.0 %   Monocytes Relative 3.5 3.0 - 12.0 %   Eosinophils Relative 0.1 0.0 - 5.0 %   Basophils Relative 0.1 0.0 - 3.0 %   Neutro Abs 12.2 (H) 1.4 - 7.7 K/uL   Lymphs Abs 1.5 0.7 - 4.0 K/uL   Monocytes Absolute 0.5 0.1 - 1.0 K/uL   Eosinophils Absolute 0.0 0.0 - 0.7 K/uL   Basophils Absolute 0.0 0.0 - 0.1 K/uL  POCT Urinalysis Dipstick (Automated)  Result Value Ref Range   Color, UA Yellow    Clarity, UA Clear    Glucose, UA Negative Negative   Bilirubin, UA Negative    Ketones, UA Negative    Spec Grav, UA 1.015 1.010 - 1.025   Blood, UA Negative    pH, UA 6.0 5.0 - 8.0   Protein, UA Negative Negative   Urobilinogen, UA 0.2 0.2 or 1.0 E.U./dL   Nitrite, UA Negative    Leukocytes, UA Negative Negative  POCT Influenza A/B  Result Value Ref Range   Influenza A, POC Negative Negative   Influenza B, POC Negative Negative   CXR -- pending  Assessment and Plan:   Eileen Wilkerson was seen today for low grade fever.  Diagnoses and all orders for this visit:  Fatigue, unspecified type and Fever, unspecified fever cause No red flags on my exam. Urine normal. CXR pending, but based on my initial read, no evidence of worsening infection. Slight worsening of kidney function -- will have patient hydrate and return to office early next week for repeat  labs -- consider reducing lasix dosage. Worsening symptoms over the weekend, needs to go to the ER. -     DG Chest 2 View; Future -     Comprehensive metabolic panel -     CBC with Differential/Platelet -     POCT Urinalysis Dipstick (Automated) -     Urine Culture -     POCT Influenza A/B -     POCT  Urinalysis Dipstick (Automated) -     Urine Culture -     POCT Influenza A/B  . Reviewed expectations re: course of current medical issues. . Discussed self-management of symptoms. . Outlined signs and symptoms indicating need for more acute intervention. . Patient verbalized understanding and all questions were answered. . See orders for this visit as documented in the electronic medical record. . Patient received an After-Visit Summary.  CMA or LPN served as scribe during this visit. History, Physical, and Plan performed by medical provider. The above documentation has been reviewed and is accurate and complete.   Inda Coke, PA-C

## 2019-01-24 NOTE — Telephone Encounter (Signed)
Pt needs a f.u next week. Tuesday around 9 am or after 2pm due to another appt.  or Wednesday. Please advise when pt can be worked in.

## 2019-01-24 NOTE — Telephone Encounter (Signed)
Can you call and get her in at the [facility] on Tuesday?

## 2019-01-25 LAB — URINE CULTURE
MICRO NUMBER:: 197386
SPECIMEN QUALITY:: ADEQUATE

## 2019-01-27 NOTE — Telephone Encounter (Signed)
Patient on schedule for tomorrow.

## 2019-01-27 NOTE — Telephone Encounter (Signed)
Called pt and left vm to schedule

## 2019-01-28 ENCOUNTER — Encounter: Payer: Self-pay | Admitting: Family Medicine

## 2019-01-28 ENCOUNTER — Ambulatory Visit: Payer: Medicare Other | Admitting: Family Medicine

## 2019-01-28 VITALS — BP 114/68 | HR 77 | Temp 97.9°F | Ht 64.0 in | Wt 167.0 lb

## 2019-01-28 DIAGNOSIS — R059 Cough, unspecified: Secondary | ICD-10-CM

## 2019-01-28 DIAGNOSIS — Z7952 Long term (current) use of systemic steroids: Secondary | ICD-10-CM | POA: Diagnosis not present

## 2019-01-28 DIAGNOSIS — M940 Chondrocostal junction syndrome [Tietze]: Secondary | ICD-10-CM | POA: Diagnosis not present

## 2019-01-28 DIAGNOSIS — M0579 Rheumatoid arthritis with rheumatoid factor of multiple sites without organ or systems involvement: Secondary | ICD-10-CM | POA: Diagnosis not present

## 2019-01-28 DIAGNOSIS — R05 Cough: Secondary | ICD-10-CM

## 2019-01-28 MED ORDER — PREDNISONE 10 MG PO TABS: 10.0000 mg | ORAL_TABLET | Freq: Every day | ORAL | 1 refills | Status: DC

## 2019-01-28 NOTE — Progress Notes (Signed)
Eileen Wilkerson is a 85 y.o. female here for an acute visit.  History of Present Illness:   I acted as a Education administrator for [employer], DO [employer], LPN  HPI:   Pt here to follow up on Bronchitis, inflamed rib cage, and fatigue. Pt is feeling more fatigue today, had injections done in her back earlier today.  PMHx, SurgHx, SocialHx, Medications, and Allergies were reviewed in the Visit Navigator and updated as appropriate.  Current Medications   Current Outpatient Medications:  .  acetaminophen (TYLENOL) 500 MG tablet, Take 500 mg by mouth every 6 (six) hours as needed., Disp: , Rfl:  .  atorvastatin (LIPITOR) 20 MG tablet, TAKE 1 TABLET BY MOUTH AT BEDTIME AS NEEDED, Disp: 90 tablet, Rfl: 0 .  Biotin (BIOTIN MAXIMUM STRENGTH) 10 MG TABS, Take 10 mg by mouth daily., Disp: , Rfl:  .  Cholecalciferol (VITAMIN D3) 5000 units CAPS, Take 5,000 Units by mouth daily., Disp: , Rfl:  .  CRANBERRY PO, Take 1 capsule by mouth daily., Disp: , Rfl:  .  cyanocobalamin (,VITAMIN B-12,) 1000 MCG/ML injection, 1000 mcg injection once per month., Disp: 6 mL, Rfl: 0 .  diclofenac sodium (VOLTAREN) 1 % GEL, APPLY 3 GRAM TOPICALLY TO THE 3 LARGEST JOINTS THREE TIMES DAILY AS NEEDED, Disp: , Rfl: 5 .  EPINEPHrine 0.3 mg/0.3 mL IJ SOAJ injection, INJECT 0.3MLS INTO THE MUSCLE ONCE FOR A ONE TIME DOSE, Disp: , Rfl: 2 .  fexofenadine (ALLEGRA) 180 MG tablet, Take 180 mg by mouth daily., Disp: , Rfl:  .  fluticasone (FLONASE) 50 MCG/ACT nasal spray, USE 2 SPRAY(S) IN EACH NOSTRIL ONCE DAILY, Disp: 16 g, Rfl: 3 .  furosemide (LASIX) 40 MG tablet, Take 40 mg by mouth daily., Disp: , Rfl:  .  furosemide (LASIX) 80 MG tablet, TAKE 1 TABLET BY MOUTH ONCE DAILY, Disp: 30 tablet, Rfl: 0 .  hydroxychloroquine (PLAQUENIL) 200 MG tablet, TAKE 1 TABLET BY MOUTH ONCE DAILY, Disp: 90 tablet, Rfl: 0 .  lansoprazole (PREVACID) 30 MG capsule, Take 1 capsule (30 mg total) by mouth daily at 12 noon., Disp: 90 capsule, Rfl:  3 .  montelukast (SINGULAIR) 10 MG tablet, TAKE 1 TABLET BY MOUTH AT BEDTIME, Disp: 30 tablet, Rfl: 3 .  potassium chloride (K-DUR,KLOR-CON) 10 MEQ tablet, TAKE 1 TABLET BY MOUTH TWICE DAILY, Disp: 60 tablet, Rfl: 0 .  predniSONE (DELTASONE) 10 MG tablet, Take 1 tablet (10 mg total) by mouth daily with breakfast., Disp: 30 tablet, Rfl: 0 .  predniSONE (DELTASONE) 5 MG tablet, TAKE 1 TABLET BY MOUTH WITH BREAKFAST AND  TAPER  AS  INSTRUCTED, Disp: 30 tablet, Rfl: 0 .  Syringe/Needle, Disp, (SYRINGE 3CC/25GX1") 25G X 1" 3 ML MISC, 1 application by Does not apply route once a week., Disp: 12 each, Rfl: 0 .  traMADol (ULTRAM) 50 MG tablet, Take 1 tablet (50 mg total) by mouth 4 (four) times daily as needed., Disp: 120 tablet, Rfl: 3   Allergies  Allergen Reactions  . Adhesive [Tape] Rash  . Celebrex [Celecoxib] Hives  . Ciprofibrate Nausea Only  . Cymbalta [Duloxetine Hcl] Swelling  . Gabitril [Tiagabine] Swelling  . Keflex [Cephalexin] Nausea And Vomiting  . Lyrica [Pregabalin] Swelling  . Neurontin [Gabapentin] Swelling  . Nexium [Esomeprazole] Rash  . Nsaids Rash  . Penicillins Rash    Injection site reaction. Tolerated cefepime in past Has patient had a PCN reaction causing immediate rash, facial/tongue/throat swelling, SOB or lightheadedness with hypotension: No  Has patient had a PCN reaction causing severe rash involving mucus membranes or skin necrosis: No Has patient had a PCN reaction that required hospitalization No Has patient had a PCN reaction occurring within the last 10 years: No If all of the above answers are "NO", then may proceed with Cephalosporin use.   . Shrimp [Shellfish Allergy] Anaphylaxis    Per patient "shrimp only"  . Sulfa Antibiotics Nausea And Vomiting  . Azactam [Aztreonam]     Hand swelling   . Azelastine Hcl     Rash   . Ciprofloxacin Other (See Comments)    dizziness  . Methotrexate Derivatives   . Nasacort [Triamcinolone]     Dizzy   .  Olopatadine Other (See Comments)    Pain and lethargy   . Zantac [Ranitidine Hcl]   . Claritin-D 12 Hour [Loratadine-Pseudoephedrine Er] Anxiety   Review of Systems   Pertinent items are noted in the HPI. Otherwise, ROS is negative.  Vitals   Vitals:   01/28/19 1514  BP: 114/68  Pulse: 77  Temp: 97.9 F (36.6 C)  TempSrc: Oral  SpO2: 95%  Weight: 167 lb (75.8 kg)  Height: 5\' 4"  (1.626 m)     Body mass index is 28.67 kg/m.  Physical Exam   Physical Exam Vitals signs and nursing note reviewed.  HENT:     Head: Normocephalic and atraumatic.  Eyes:     Pupils: Pupils are equal, round, and reactive to light.  Neck:     Musculoskeletal: Normal range of motion and neck supple.  Cardiovascular:     Rate and Rhythm: Normal rate and regular rhythm.     Heart sounds: Normal heart sounds.  Pulmonary:     Effort: Pulmonary effort is normal.  Abdominal:     Palpations: Abdomen is soft.  Skin:    General: Skin is warm.  Psychiatric:        Behavior: Behavior normal.      Results for orders placed or performed in visit on 01/24/19  Urine Culture  Result Value Ref Range   MICRO NUMBER: [id number]    SPECIMEN QUALITY: Adequate    Sample Source NOT GIVEN    STATUS: FINAL    Result:      Single organism less than 10,000 CFU/mL isolated. These organisms, commonly found on external and internal genitalia, are considered colonizers. No further testing performed.  Comprehensive metabolic panel  Result Value Ref Range   Sodium 139 135 - 145 mEq/L   Potassium 4.0 3.5 - 5.1 mEq/L   Chloride 100 96 - 112 mEq/L   CO2 29 19 - 32 mEq/L   Glucose, Bld 106 (H) 70 - 99 mg/dL   BUN 38 (H) 6 - 23 mg/dL   Creatinine, Ser 1.15 0.40 - 1.20 mg/dL   Total Bilirubin 1.0 0.2 - 1.2 mg/dL   Alkaline Phosphatase 58 39 - 117 U/L   AST 18 0 - 37 U/L   ALT 14 0 - 35 U/L   Total Protein 7.8 6.0 - 8.3 g/dL   Albumin 4.4 3.5 - 5.2 g/dL   Calcium 10.2 8.4 - 10.5 mg/dL   GFR 44.91 (L) >60.00  mL/min  CBC with Differential/Platelet  Result Value Ref Range   WBC 14.3 (H) 4.0 - 10.5 K/uL   RBC 4.47 3.87 - 5.11 Mil/uL   Hemoglobin 13.7 12.0 - 15.0 g/dL   HCT 42.4 36.0 - 46.0 %   MCV 94.8 78.0 - 100.0 fl   MCHC 32.3  30.0 - 36.0 g/dL   RDW 14.0 11.5 - 15.5 %   Platelets 339.0 150.0 - 400.0 K/uL   Neutrophils Relative % 85.5 Repeated and verified X2. (H) 43.0 - 77.0 %   Lymphocytes Relative 10.8 (L) 12.0 - 46.0 %   Monocytes Relative 3.5 3.0 - 12.0 %   Eosinophils Relative 0.1 0.0 - 5.0 %   Basophils Relative 0.1 0.0 - 3.0 %   Neutro Abs 12.2 (H) 1.4 - 7.7 K/uL   Lymphs Abs 1.5 0.7 - 4.0 K/uL   Monocytes Absolute 0.5 0.1 - 1.0 K/uL   Eosinophils Absolute 0.0 0.0 - 0.7 K/uL   Basophils Absolute 0.0 0.0 - 0.1 K/uL  POCT Urinalysis Dipstick (Automated)  Result Value Ref Range   Color, UA Yellow    Clarity, UA Clear    Glucose, UA Negative Negative   Bilirubin, UA Negative    Ketones, UA Negative    Spec Grav, UA 1.015 1.010 - 1.025   Blood, UA Negative    pH, UA 6.0 5.0 - 8.0   Protein, UA Negative Negative   Urobilinogen, UA 0.2 0.2 or 1.0 E.U./dL   Nitrite, UA Negative    Leukocytes, UA Negative Negative  POCT Influenza A/B  Result Value Ref Range   Influenza A, POC Negative Negative   Influenza B, POC Negative Negative    Assessment and Plan   Tonie was seen today for fatigue.  Diagnoses and all orders for this visit:  Rheumatoid arthritis involving multiple sites with positive rheumatoid factor ([place]) Comments: Flare with illness. On chronic prednisone. Will increase daily dose for a couple of weeks.   Long term current use of systemic steroids, for RA Comments: Will increase to 15 mg daily and wean back to 7 over the next few weeks.   Cough Comments: Improving. Now tired and with costochonditis from cough.  Costochondritis Comments: As above.   Other orders -     predniSONE (DELTASONE) 10 MG tablet; Take 1 tablet (10 mg total) by mouth daily  with breakfast.    . Reviewed expectations re: course of current medical issues. . Discussed self-management of symptoms. . Outlined signs and symptoms indicating need for more acute intervention. . Patient verbalized understanding and all questions were answered. Marland Kitchen Health Maintenance issues including appropriate healthy diet, exercise, and smoking avoidance were discussed with patient. . See orders for this visit as documented in the electronic medical record. . Patient received an After Visit Summary.  CMA served as Education administrator during this visit. History, Physical, and Plan performed by medical provider. The above documentation has been reviewed and is accurate and complete. Briscoe Deutscher, D.O.  Briscoe Deutscher, DO [place], [facility] 02/09/2019

## 2019-01-29 ENCOUNTER — Telehealth: Payer: Self-pay | Admitting: Family Medicine

## 2019-01-29 NOTE — Telephone Encounter (Signed)
See note

## 2019-01-29 NOTE — Telephone Encounter (Signed)
Copied from [place] [id number]. Topic: Quick Communication - See Telephone Encounter >> Jan 29, 2019  4:27 PM Ivar Drape wrote: CRM for notification. See Telephone encounter for: 01/29/19. Edie a Pharmacist w/[facility] [phone] would like to know if the patient is suppose to take 1 or 1 1/2 tablets of her  predniSONE (DELTASONE) 10 MG tablet medication.  The patient is saying 1 1/2 tablets but the directions that was sent to the pharmacy is one tablet a day.  Please advise.

## 2019-01-30 ENCOUNTER — Other Ambulatory Visit: Payer: Self-pay

## 2019-01-30 DIAGNOSIS — R059 Cough, unspecified: Secondary | ICD-10-CM

## 2019-01-30 DIAGNOSIS — R05 Cough: Secondary | ICD-10-CM

## 2019-01-30 MED ORDER — PREDNISONE 10 MG PO TABS: ORAL_TABLET | ORAL | 0 refills | Status: DC

## 2019-01-30 NOTE — Telephone Encounter (Signed)
Per Dr. Juleen China patient should be on 1 1/2 and tapering instructions were given to patient at time of visit. Will call pharmacy when opens to let them know.

## 2019-01-30 NOTE — Telephone Encounter (Signed)
Have called pharmacy 2 times once has not  Closed. I have sent in new script for 1 1/2 a day and that should clear up the confusion and allow her to get covered by insurance. I have called patient and let her know. She will call if any problems.

## 2019-02-04 MED FILL — SHINGRIX 50 MCG SUS: 50 | 1 days supply | Qty: 1 | Fill #0

## 2019-02-06 ENCOUNTER — Other Ambulatory Visit: Payer: Self-pay | Admitting: Rheumatology

## 2019-02-06 ENCOUNTER — Ambulatory Visit: Payer: Medicare Other | Admitting: Allergy

## 2019-02-06 ENCOUNTER — Encounter: Payer: Self-pay | Admitting: Allergy

## 2019-02-06 VITALS — BP 122/78 | HR 68 | Resp 16

## 2019-02-06 DIAGNOSIS — J310 Chronic rhinitis: Secondary | ICD-10-CM | POA: Diagnosis not present

## 2019-02-06 DIAGNOSIS — Z91018 Allergy to other foods: Secondary | ICD-10-CM | POA: Diagnosis not present

## 2019-02-06 DIAGNOSIS — B999 Unspecified infectious disease: Secondary | ICD-10-CM

## 2019-02-06 MED ORDER — IPRATROPIUM BROMIDE 0.03 % NA SOLN: 2.0000 | Freq: Three times a day (TID) | NASAL | 3 refills | Status: DC

## 2019-02-06 NOTE — Patient Instructions (Addendum)
Non-allergic rhinitis and conjunctivitis   - environmental allergy testing via serum IgE levels was negative.     - you most likely have non-allergic triggers that can cause similar symptoms as allergic triggers   - continue allegra 180mg  daily   - for nasal congestion continue use fluticasone (generic) 2 sprays each nostril daily as needed as this has not caused symptoms.     - for nasal drainage or post-nasal drip trial use of nasal Atrovent 0.03% 2 sprays each nostril up 3 times a day.     - continue singulair 10mg  at bedtime  Recurrent infections   - immunocompetence work-up was reassuring  Shrimp allergy   - continue avoidance measures for shrimp allergy   - would like for you to have access to Epipen in case of a severe allergic reaction   Follow-up 4-6 months or sooner if needed

## 2019-02-06 NOTE — Progress Notes (Signed)
Follow-up Note  RE: Eileen Wilkerson MRN: [id number] DOB: 1932/03/01 Date of Office Visit: 02/06/2019   History of present illness: Eileen Wilkerson is a 85 y.o. female presenting today for follow-up of non-allergic rhinitis and food allergy.   She was last seen in the office on 10/09/18 by myself.   She states earlier this month she developed chest pains but did not believe it was cardiac in nature.  She went to her PCP and saw a different provider who wanted to rule-out cardiac cause and had labs done that were reassuring.  She also has a CXR that showed some atelectasis in left lower base.  She had a follow-up CXR about a week later that continued to show small atelectasis in left lower basis.  She also states she was diagnosed with costchondritis.  She also was prescribed a 10 day antibiotic course(belives was doxycycline) based on initial CXR.  She states she felt no different after the antibiotic course.    Astelin use she reports made her develop "blisters" in her nose thus she discontinued use. She states the blisters in the nose started after the first several uses.  She states she still has nasal drainage and it is worse at night before bedtime.  She takes singulair at night.   She also continues on allegras as well.  Fluticasone is the only nasal steroid spray that she has been able to tolerate.    She continues to avoid shrimp and has not had any reactions since last visit.     Review of systems: Review of Systems  Constitutional: Negative for chills, fever and malaise/fatigue.  HENT: Positive for congestion. Negative for ear discharge, nosebleeds, sinus pain and sore throat.   Eyes: Negative for pain, discharge and redness.  Respiratory: Negative for cough, shortness of breath and wheezing.   Cardiovascular: Negative for chest pain.  Gastrointestinal: Negative for abdominal pain, constipation, diarrhea, nausea and vomiting.  Musculoskeletal: Negative for joint pain.    Skin: Negative for itching and rash.  Neurological: Negative for headaches.    All other systems negative unless noted above in HPI  Past medical/social/surgical/family history have been reviewed and are unchanged unless specifically indicated below.  No changes  Medication List: Allergies as of 02/06/2019      Reactions   Adhesive [tape] Rash   Celebrex [celecoxib] Hives   Ciprofibrate Nausea Only   Cymbalta [duloxetine Hcl] Swelling   Gabitril [tiagabine] Swelling   Keflex [cephalexin] Nausea And Vomiting   Lyrica [pregabalin] Swelling   Neurontin [gabapentin] Swelling   Nexium [esomeprazole] Rash   Nsaids Rash   Penicillins Rash   Injection site reaction. Tolerated cefepime in past Has patient had a PCN reaction causing immediate rash, facial/tongue/throat swelling, SOB or lightheadedness with hypotension: No Has patient had a PCN reaction causing severe rash involving mucus membranes or skin necrosis: No Has patient had a PCN reaction that required hospitalization No Has patient had a PCN reaction occurring within the last 10 years: No If all of the above answers are "NO", then may proceed with Cephalosporin use.   Shrimp [shellfish Allergy] Anaphylaxis   Per patient "shrimp only"   Sulfa Antibiotics Nausea And Vomiting   Azactam [aztreonam]    Hand swelling    Azelastine Hcl    Rash    Ciprofloxacin Other (See Comments)   dizziness   Methotrexate Derivatives    Nasacort [triamcinolone]    Dizzy    Olopatadine Other (See Comments)   Pain  and lethargy    Zantac [ranitidine Hcl]    Claritin-d 12 Hour [loratadine-pseudoephedrine Er] Anxiety      Medication List       Accurate as of February 06, 2019  3:51 PM. Always use your most recent med list.        acetaminophen 500 MG tablet Commonly known as:  TYLENOL Take 500 mg by mouth every 6 (six) hours as needed.   atorvastatin 20 MG tablet Commonly known as:  LIPITOR TAKE 1 TABLET BY MOUTH AT BEDTIME AS  NEEDED   BIOTIN MAXIMUM STRENGTH 10 MG Tabs Generic drug:  Biotin Take 10 mg by mouth daily.   CRANBERRY PO Take 1 capsule by mouth daily.   cyanocobalamin 1000 MCG/ML injection Commonly known as:  (VITAMIN B-12) 1000 mcg injection once per month.   diclofenac sodium 1 % Gel Commonly known as:  VOLTAREN APPLY 3 GRAM TOPICALLY TO THE 3 LARGEST JOINTS THREE TIMES DAILY AS NEEDED   EPINEPHrine 0.3 mg/0.3 mL Soaj injection Commonly known as:  EPI-PEN INJECT 0.3MLS INTO THE MUSCLE ONCE FOR A ONE TIME DOSE   fexofenadine 180 MG tablet Commonly known as:  ALLEGRA Take 180 mg by mouth daily.   fluticasone 50 MCG/ACT nasal spray Commonly known as:  FLONASE USE 2 SPRAY(S) IN EACH NOSTRIL ONCE DAILY   hydroxychloroquine 200 MG tablet Commonly known as:  PLAQUENIL TAKE 1 TABLET BY MOUTH ONCE DAILY   lansoprazole 30 MG capsule Commonly known as:  PREVACID Take 1 capsule (30 mg total) by mouth daily at 12 noon.   LASIX 40 MG tablet Generic drug:  furosemide Take 40 mg by mouth daily.   furosemide 80 MG tablet Commonly known as:  LASIX TAKE 1 TABLET BY MOUTH ONCE DAILY   montelukast 10 MG tablet Commonly known as:  SINGULAIR TAKE 1 TABLET BY MOUTH AT BEDTIME   potassium chloride 10 MEQ tablet Commonly known as:  K-DUR,KLOR-CON TAKE 1 TABLET BY MOUTH TWICE DAILY   predniSONE 5 MG tablet Commonly known as:  DELTASONE TAKE 1 TABLET BY MOUTH WITH BREAKFAST AND  TAPER  AS  INSTRUCTED   predniSONE 10 MG tablet Commonly known as:  DELTASONE Take 1 tablet (10 mg total) by mouth daily with breakfast.   predniSONE 10 MG tablet Commonly known as:  DELTASONE Take 1 1/2 daily as directed.   SYRINGE 3CC/25GX1" 25G X 1" 3 ML Misc 1 application by Does not apply route once a week.   traMADol 50 MG tablet Commonly known as:  ULTRAM Take 1 tablet (50 mg total) by mouth 4 (four) times daily as needed.   Vitamin D3 125 MCG (5000 UT) Caps Take 5,000 Units by mouth daily.        Known medication allergies: Allergies  Allergen Reactions  . Adhesive [Tape] Rash  . Celebrex [Celecoxib] Hives  . Ciprofibrate Nausea Only  . Cymbalta [Duloxetine Hcl] Swelling  . Gabitril [Tiagabine] Swelling  . Keflex [Cephalexin] Nausea And Vomiting  . Lyrica [Pregabalin] Swelling  . Neurontin [Gabapentin] Swelling  . Nexium [Esomeprazole] Rash  . Nsaids Rash  . Penicillins Rash    Injection site reaction. Tolerated cefepime in past Has patient had a PCN reaction causing immediate rash, facial/tongue/throat swelling, SOB or lightheadedness with hypotension: No Has patient had a PCN reaction causing severe rash involving mucus membranes or skin necrosis: No Has patient had a PCN reaction that required hospitalization No Has patient had a PCN reaction occurring within the last 10 years: No If  all of the above answers are "NO", then may proceed with Cephalosporin use.   . Shrimp [Shellfish Allergy] Anaphylaxis    Per patient "shrimp only"  . Sulfa Antibiotics Nausea And Vomiting  . Azactam [Aztreonam]     Hand swelling   . Azelastine Hcl     Rash   . Ciprofloxacin Other (See Comments)    dizziness  . Methotrexate Derivatives   . Nasacort [Triamcinolone]     Dizzy   . Olopatadine Other (See Comments)    Pain and lethargy   . Zantac [Ranitidine Hcl]   . Claritin-D 12 Hour [Loratadine-Pseudoephedrine Er] Anxiety     Physical examination: Blood pressure 122/78, pulse 68, resp. rate 16.  General: Alert, interactive, in no acute distress. HEENT: PERRLA, TMs pearly gray, turbinates minimally edematous without discharge, post-pharynx non erythematous. Neck: Supple without lymphadenopathy. Lungs: Clear to auscultation without wheezing, rhonchi or rales. {no increased work of breathing. CV: Normal S1, S2 without murmurs. Abdomen: Nondistended, nontender. Skin: Warm and dry, without lesions or rashes. Extremities:  No clubbing, cyanosis or edema. Neuro:   Grossly  intact.  Diagnositics/Labs: Labs/imaging: imaging personally reviewed CXR 01/13/19- The heart size and mediastinal contours are within normal limits. Normal pulmonary vascularity. Mild diffuse peribronchial thickening. Mild subsegmental atelectasis at the left lung base. No focal consolidation, pleural effusion, or pneumothorax. No acute osseous abnormality. Prior lower cervical ACDF.  IMPRESSION: 1. Bronchitic changes.  Left basilar atelectasis.  CXR 01/24/19- Lungs are adequately inflated with subtle persistent density over the left base likely atelectasis or vascular crowding. No lobar consolidation or effusion. Cardiomediastinal silhouette and remainder of the exam is unchanged.  IMPRESSION: Subtle left base density unchanged likely vascular crowding or atelectasis.  Assessment and plan:   Non-allergic rhinitis and conjunctivitis   - environmental allergy testing via serum IgE levels was negative.     - you most likely have non-allergic triggers that can cause similar symptoms as allergic triggers   - continue allegra 180mg  daily   - for nasal congestion continue use fluticasone (generic) 2 sprays each nostril daily as needed as this has not caused symptoms.     - for nasal drainage or post-nasal drip trial use of nasal Atrovent 0.03% 2 sprays each nostril up 3 times a day.     - continue singulair 10mg  at bedtime  Recurrent infections   - immunocompetence work-up was reassuring  Shrimp allergy   - continue avoidance measures for shrimp allergy   - would like for you to have access to Epipen in case of a severe allergic reaction   Follow-up 4-6 months or sooner if needed  I appreciate the opportunity to take part in Kosha's care. Please do not hesitate to contact me with questions.  Sincerely,   Prudy Feeler, MD Allergy/Immunology [facility] [place]

## 2019-02-07 NOTE — Telephone Encounter (Signed)
Last Visit: 01/08/19 Next visit: 06/10/19

## 2019-02-07 NOTE — Telephone Encounter (Signed)
Patient is currently on an increased dose of Prednisone by PCP. Patient advised to contact the office once she has tapered back to the 7 mg and we will send refill to the pharmacy.

## 2019-02-09 ENCOUNTER — Encounter: Payer: Self-pay | Admitting: Family Medicine

## 2019-02-11 ENCOUNTER — Encounter: Payer: Self-pay | Admitting: Rheumatology

## 2019-02-11 ENCOUNTER — Ambulatory Visit: Payer: Medicare Other | Admitting: Rheumatology

## 2019-02-11 ENCOUNTER — Ambulatory Visit (INDEPENDENT_AMBULATORY_CARE_PROVIDER_SITE_OTHER): Payer: Medicare Other

## 2019-02-11 VITALS — BP 113/72 | HR 84 | Resp 14 | Ht 64.0 in | Wt 167.2 lb

## 2019-02-11 DIAGNOSIS — M25562 Pain in left knee: Secondary | ICD-10-CM | POA: Diagnosis not present

## 2019-02-11 DIAGNOSIS — M19072 Primary osteoarthritis, left ankle and foot: Secondary | ICD-10-CM

## 2019-02-11 DIAGNOSIS — M0579 Rheumatoid arthritis with rheumatoid factor of multiple sites without organ or systems involvement: Secondary | ICD-10-CM | POA: Diagnosis not present

## 2019-02-11 DIAGNOSIS — Z79899 Other long term (current) drug therapy: Secondary | ICD-10-CM

## 2019-02-11 DIAGNOSIS — Z7952 Long term (current) use of systemic steroids: Secondary | ICD-10-CM

## 2019-02-11 DIAGNOSIS — M19041 Primary osteoarthritis, right hand: Secondary | ICD-10-CM

## 2019-02-11 DIAGNOSIS — M810 Age-related osteoporosis without current pathological fracture: Secondary | ICD-10-CM

## 2019-02-11 DIAGNOSIS — E2740 Unspecified adrenocortical insufficiency: Secondary | ICD-10-CM

## 2019-02-11 DIAGNOSIS — M170 Bilateral primary osteoarthritis of knee: Secondary | ICD-10-CM | POA: Diagnosis not present

## 2019-02-11 DIAGNOSIS — Z87898 Personal history of other specified conditions: Secondary | ICD-10-CM

## 2019-02-11 DIAGNOSIS — M19071 Primary osteoarthritis, right ankle and foot: Secondary | ICD-10-CM

## 2019-02-11 DIAGNOSIS — M5030 Other cervical disc degeneration, unspecified cervical region: Secondary | ICD-10-CM

## 2019-02-11 DIAGNOSIS — M797 Fibromyalgia: Secondary | ICD-10-CM

## 2019-02-11 DIAGNOSIS — M19042 Primary osteoarthritis, left hand: Secondary | ICD-10-CM

## 2019-02-11 MED ORDER — LIDOCAINE HCL 1 % IJ SOLN
1.5000 mL | INTRAMUSCULAR | Status: AC | PRN
  Administered 2019-02-11: 1.5 mL

## 2019-02-11 MED ORDER — TRIAMCINOLONE ACETONIDE 40 MG/ML IJ SUSP
40.0000 mg | INTRAMUSCULAR | Status: AC | PRN
  Administered 2019-02-11: 40 mg via INTRA_ARTICULAR

## 2019-02-11 NOTE — Progress Notes (Signed)
Office Visit Note  Patient: Eileen Wilkerson             Date of Birth: [birth year]           MRN: [id number]             PCP: Briscoe Deutscher, DO Referring: Briscoe Deutscher, DO Visit Date: 02/11/2019 Occupation: @GUAROCC @  Subjective:  Left knee pain.   History of Present Illness: Eileen Wilkerson is a 85 y.o. female with history of rheumatoid arthritis and fibromyalgia.  She states about 4 days ago she started having pain in her left knee.  The pain has been progressively getting worse.  She states she applied heat and rested yesterday which did not help much.  She is having difficulty walking.  None of the other joints are painful or swollen.  Been taking Plaquenil on regular basis.  She is currently on prednisone 15 mg p.o. daily due to costochondritis.  She continues to have some generalized pain from fibromyalgia.  Activities of Daily Living:  Patient reports morning stiffness for 2 hours.   Patient Reports nocturnal pain.  Difficulty dressing/grooming: Reports Difficulty climbing stairs: Reports Difficulty getting out of chair: Reports Difficulty using hands for taps, buttons, cutlery, and/or writing: Denies  Review of Systems  Constitutional: Positive for fatigue. Negative for night sweats, weight gain and weight loss.  HENT: Positive for mouth dryness. Negative for mouth sores, trouble swallowing, trouble swallowing and nose dryness.   Eyes: Negative for pain, redness, visual disturbance and dryness.  Respiratory: Negative for cough, shortness of breath and difficulty breathing.   Cardiovascular: Negative for chest pain, palpitations, hypertension, irregular heartbeat and swelling in legs/feet.  Gastrointestinal: Negative for blood in stool, constipation and diarrhea.  Endocrine: Negative for increased urination.  Genitourinary: Negative for vaginal dryness.  Musculoskeletal: Positive for arthralgias, joint pain, myalgias, morning stiffness and myalgias. Negative for  joint swelling, muscle weakness and muscle tenderness.  Skin: Negative for color change, rash, hair loss, skin tightness, ulcers and sensitivity to sunlight.  Allergic/Immunologic: Negative for susceptible to infections.  Neurological: Negative for dizziness, memory loss, night sweats and weakness.  Hematological: Negative for swollen glands.  Psychiatric/Behavioral: Positive for sleep disturbance. Negative for depressed mood. The patient is not nervous/anxious.     PMFS History:  Patient Active Problem List   Diagnosis Date Noted  . B12 deficiency 03/11/2018  . Idiopathic chronic venous hypertension of both lower extremities with ulcer and inflammation ([place]) 09/18/2017  . Bilateral lower extremity edema 06/30/2017  . Physical deconditioning 05/30/2017  . Immunosuppressed status ([place]) 05/30/2017  . Abnormal chest x-ray 05/17/2017  . Fibromyalgia 12/18/2016  . DJD (degenerative joint disease), cervical 12/18/2016  . Spondylosis of lumbar region without myelopathy or radiculopathy 12/18/2016  . Trigger finger, right middle finger 12/18/2016  . Primary osteoarthritis of both feet 12/18/2016  . Primary osteoarthritis of both knees 12/18/2016  . GERD (gastroesophageal reflux disease) 12/18/2016  . Age-related osteoporosis without current pathological fracture 12/18/2016  . Rheumatoid arthritis ([place]) 12/28/2015  . Long term current use of systemic steroids, for RA 12/28/2015  . Lymphocytosis 11/13/2012    Past Medical History:  Diagnosis Date  . Adrenal failure ([place])   . Arthritis   . Asthma   . Cataract   . Closed nondisplaced fracture of fifth right metatarsal bone 09/18/2017  . Fibromyalgia 2008  . HCAP (healthcare-associated pneumonia) 02/03/2018  . Osteoporosis   . RA (rheumatoid arthritis) ([place])   . Recurrent upper respiratory infection (URI)   .  Sepsis due to urinary tract infection ([place]) 01/18/2018  . Urticaria     Family History  Problem Relation Age of Onset  . Heart  attack Maternal Grandmother   . Heart attack Paternal Grandfather   . Breast cancer Mother   . Diabetes Father   . Heart disease Father   . Allergic rhinitis Neg Hx   . Asthma Neg Hx   . Eczema Neg Hx   . Urticaria Neg Hx    Past Surgical History:  Procedure Laterality Date  . BACK SURGERY    . BILATERAL CARPAL TUNNEL RELEASE    . CATARACT EXTRACTION, BILATERAL  2004  . CERVICAL FUSION  2011,2010,2008  . HEEL SPUR SURGERY  2004  . ROTATOR CUFF REPAIR    . TONSILLECTOMY AND ADENOIDECTOMY  1947  . TOTAL SHOULDER ARTHROPLASTY     Social History   Social History Narrative  . Not on file   Immunization History  Administered Date(s) Administered  . Influenza, High Dose Seasonal PF 08/16/2018  . Influenza,inj,Quad PF,6+ Mos 08/11/2016  . Influenza-Unspecified 11/12/2017  . PPD Test 03/01/2018  . Pneumococcal Conjugate-13 06/26/2017  . Zoster Recombinat (Shingrix) 02/05/2019     Objective: Vital Signs: BP 113/72 (BP Location: Left Arm, Patient Position: Sitting, Cuff Size: Normal)   Pulse 84   Resp 14   Ht 5\' 4"  (1.626 m)   Wt 167 lb 3.2 oz (75.8 kg)   BMI 28.70 kg/m    Physical Exam Vitals signs and nursing note reviewed.  Constitutional:      Appearance: She is well-developed.  HENT:     Head: Normocephalic and atraumatic.  Eyes:     Conjunctiva/sclera: Conjunctivae normal.  Neck:     Musculoskeletal: Normal range of motion.  Cardiovascular:     Rate and Rhythm: Normal rate and regular rhythm.     Heart sounds: Normal heart sounds.  Pulmonary:     Effort: Pulmonary effort is normal.     Breath sounds: Normal breath sounds.  Abdominal:     General: Bowel sounds are normal.     Palpations: Abdomen is soft.  Lymphadenopathy:     Cervical: No cervical adenopathy.  Skin:    General: Skin is warm and dry.     Capillary Refill: Capillary refill takes less than 2 seconds.  Neurological:     Mental Status: She is alert and oriented to person, place, and time.   Psychiatric:        Behavior: Behavior normal.      Musculoskeletal Exam: C-spine thoracic and lumbar spine limited range of motion.  Shoulder joints elbow joints wrist joints with good range of motion.  She has DIP and PIP thickening with no synovitis.  Hip joints are good range of motion.  She has discomfort with range of motion of her left knee joint without any warmth swelling or effusion.  Right knee joint was in good range of motion.  No synovitis was noted to her MTPs.  She has generalized hyperalgesia.  CDAI Exam: CDAI Score: 1.4  Patient Global Assessment: 2 (mm); Provider Global Assessment: 2 (mm) Swollen: 0 ; Tender: 1  Joint Exam      Right  Left  Knee      Tender     Investigation: No additional findings.  Imaging: Dg Chest 2 View  Result Date: 01/24/2019 CLINICAL DATA:  Fatigue and weakness with low-grade fever and cough 1 week. EXAM: CHEST - 2 VIEW COMPARISON:  01/13/2019 and 01/18/2018 FINDINGS: Lungs are  adequately inflated with subtle persistent density over the left base likely atelectasis or vascular crowding. No lobar consolidation or effusion. Cardiomediastinal silhouette and remainder of the exam is unchanged. IMPRESSION: Subtle left base density unchanged likely vascular crowding or atelectasis. Electronically Signed   By: Marin Olp M.D.   On: 01/24/2019 23:06   Dg Chest 2 View  Result Date: 01/13/2019 CLINICAL DATA:  Intermittent chest pain for the past 2-3 weeks. EXAM: CHEST - 2 VIEW COMPARISON:  Chest x-ray dated February 13, 2018. FINDINGS: The heart size and mediastinal contours are within normal limits. Normal pulmonary vascularity. Mild diffuse peribronchial thickening. Mild subsegmental atelectasis at the left lung base. No focal consolidation, pleural effusion, or pneumothorax. No acute osseous abnormality. Prior lower cervical ACDF. IMPRESSION: 1. Bronchitic changes.  Left basilar atelectasis. Electronically Signed   By: Titus Dubin M.D.   On:  01/13/2019 17:10   Xr Knee 3 View Left  Result Date: 02/11/2019 Medial intercondylar and lateral osteophytes with moderate to severe medial compartment narrowing was noted.  No chondrocalcinosis was noted.  Moderate patellofemoral narrowing was noted. Impression: These findings are consistent with moderate to severe osteoarthritis and moderate chondromalacia patella.   Recent Labs: Lab Results  Component Value Date   WBC 14.3 (H) 01/24/2019   HGB 13.7 01/24/2019   PLT 339.0 01/24/2019   NA 139 01/24/2019   K 4.0 01/24/2019   CL 100 01/24/2019   CO2 29 01/24/2019   GLUCOSE 106 (H) 01/24/2019   BUN 38 (H) 01/24/2019   CREATININE 1.15 01/24/2019   BILITOT 1.0 01/24/2019   ALKPHOS 58 01/24/2019   AST 18 01/24/2019   ALT 14 01/24/2019   PROT 7.8 01/24/2019   ALBUMIN 4.4 01/24/2019   CALCIUM 10.2 01/24/2019   GFRAA 50 (L) 11/25/2018    Speciality Comments: PLQ eye exam:10/22/18 Normal. [facility] Follow up in 6 months.  Procedures:  Large Joint Inj: L knee on 02/11/2019 2:26 PM Indications: pain Details: 27 G 1.5 in needle, medial approach  Arthrogram: No  Medications: 40 mg triamcinolone acetonide 40 MG/ML; 1.5 mL lidocaine 1 % Aspirate: 0 mL Outcome: tolerated well, no immediate complications Procedure, treatment alternatives, risks and benefits explained, specific risks discussed. Consent was given by the patient. Immediately prior to procedure a time out was called to verify the correct patient, procedure, equipment, support staff and site/side marked as required. Patient was prepped and draped in the usual sterile fashion.     Allergies: Adhesive [tape]; Celebrex [celecoxib]; Ciprofibrate; Cymbalta [duloxetine hcl]; Gabitril [tiagabine]; Keflex [cephalexin]; Lyrica [pregabalin]; Neurontin [gabapentin]; Nexium [esomeprazole]; Nsaids; Penicillins; Shrimp [shellfish allergy]; Sulfa antibiotics; Azactam [aztreonam]; Azelastine hcl; Ciprofloxacin; Methotrexate  derivatives; Nasacort [triamcinolone]; Olopatadine; Zantac [ranitidine hcl]; and Claritin-d 12 hour [loratadine-pseudoephedrine er]   Assessment / Plan:     Visit Diagnoses: Rheumatoid arthritis involving multiple sites with positive rheumatoid factor (HCC)-patient had no synovitis on examination today.  She has been having increased pain and discomfort in her left knee joint and difficulty walking.  Acute pain of left knee -patient has been experiencing pain in her left knee for the last 4 days.  No warmth swelling or effusion was noted.  Plan: XR KNEE 3 VIEW LEFT.  The x-ray of the knee joint showed moderate to severe osteoarthritis and moderate chondromalacia patella.  Per patient's request after informed consent was obtained left knee joint was injected with cortisone which she tolerated well.  Have advised her to contact me in case she has persistent symptoms.  High risk medication  use - ( discontinued MTX in 12/2017 due to frequent infections)Plaquenil 200 mg daily and prednisone. eye exam: 10/22/2018 her labs from February showed elevated white cell count.  Patient states she has been on prednisone for costochondritis.  Primary osteoarthritis of both hands-she has osteoarthritic changes with no synovitis.  Primary osteoarthritis of both knees-she has chronic discomfort in her knee joints.  Primary osteoarthritis of both feet-she has been using proper fitting shoes which is been helpful.  Age-related osteoporosis without current pathological fracture-last bone density from December 27, 2017 showed a T score of -1.7.  She was treated with Reclast in the past.  She is on a drug holiday currently.  Fibromyalgia-she has been having a flare with generalized pain and discomfort.  She is positive tender points.  DDD (degenerative disc disease), cervical-she has limited range of motion with discomfort.  Long term current use of systemic steroids, for RA  History of fatigue  Adrenal  insufficiency ([place]) - She is on Prednisone    Orders: Orders Placed This Encounter  Procedures  . Large Joint Inj  . XR KNEE 3 VIEW LEFT   No orders of the defined types were placed in this encounter.   Face-to-face time spent with patient was 30 minutes. Greater than 50% of time was spent in counseling and coordination of care.  Follow-Up Instructions: Return in about 5 months (around 07/14/2019) for Rheumatoid arthritis, Osteoarthritis.   Bo Merino, MD  Note - This record has been created using Editor, commissioning.  Chart creation errors have been sought, but may not always  have been located. Such creation errors do not reflect on  the standard of medical care.

## 2019-02-17 ENCOUNTER — Telehealth: Payer: Self-pay | Admitting: *Deleted

## 2019-02-17 DIAGNOSIS — M25562 Pain in left knee: Secondary | ICD-10-CM

## 2019-02-17 NOTE — Telephone Encounter (Signed)
Patient left message stating she is still having trouble with her left leg. She was seen in office last Tuesday and was given and injection. Unable to put weight on it. Using a walker.

## 2019-02-17 NOTE — Telephone Encounter (Signed)
Patient advised per Dr.Deveshwar since she has had no improvement with the cortisone injection the recommendation is to get a MRI of the knee joint. MRI order has been placed.

## 2019-02-17 NOTE — Telephone Encounter (Signed)
I left message on patient's answering machine.  If she had no improvement with the cortisone injection I would recommend getting MRI of her knee joint.  She has severe osteoarthritis.

## 2019-02-17 NOTE — Telephone Encounter (Signed)
Patient states she is still having trouble in her left knee. Patient is unable to put any weight left knee. Patient states the pain is no better than it was last Tuesday prior to the injection. Patient states she is still taking Prednisone 15 mg,  Tramadol and PLQ. Patient states she does not feel any warmth to the knee but does notice some swelling. Patient would like to know what else she may do for the pain in her knee as she is unable to put any weight on it.

## 2019-02-19 ENCOUNTER — Telehealth: Payer: Self-pay | Admitting: *Deleted

## 2019-02-19 ENCOUNTER — Ambulatory Visit: Payer: Medicare Other | Admitting: Family Medicine

## 2019-02-19 ENCOUNTER — Encounter: Payer: Self-pay | Admitting: Family Medicine

## 2019-02-19 ENCOUNTER — Other Ambulatory Visit: Payer: Self-pay

## 2019-02-19 VITALS — BP 126/78 | HR 93 | Temp 97.9°F | Ht 64.0 in | Wt 166.4 lb

## 2019-02-19 DIAGNOSIS — D1809 Hemangioma of other sites: Secondary | ICD-10-CM

## 2019-02-19 DIAGNOSIS — L821 Other seborrheic keratosis: Secondary | ICD-10-CM

## 2019-02-19 DIAGNOSIS — M940 Chondrocostal junction syndrome [Tietze]: Secondary | ICD-10-CM | POA: Diagnosis not present

## 2019-02-19 DIAGNOSIS — L989 Disorder of the skin and subcutaneous tissue, unspecified: Secondary | ICD-10-CM

## 2019-02-19 NOTE — Progress Notes (Signed)
Eileen Wilkerson is a 85 y.o. female is here for follow up.  History of Present Illness:   [name], CMA acting as scribe for Dr. Briscoe Deutscher.   HPI: Patient has several spots on back she would like to have removed.   She is also still having rib pain . She is currently taking 15mg  of prednisone daily and does not want to taper off until rib pain is improved.   Health Maintenance Due  Topic Date Due  . PNA vac Low Risk Adult (2 of 2 - PPSV23) 06/26/2018   Depression screen [facility] 2/9 12/30/2018 07/09/2018 07/09/2018  Decreased Interest 0 0 0  Down, Depressed, Hopeless 0 0 0  PHQ - 2 Score 0 0 0  Altered sleeping 0 - 0  Tired, decreased energy 3 - 0  Change in appetite 1 - 0  Feeling bad or failure about yourself  0 - 0  Trouble concentrating 0 - 0  Moving slowly or fidgety/restless 0 - 0  Suicidal thoughts 0 - 0  PHQ-9 Score 4 - 0  Difficult doing work/chores Not difficult at all - Not difficult at all   PMHx, SurgHx, SocialHx, FamHx, Medications, and Allergies were reviewed in the Visit Navigator and updated as appropriate.   Patient Active Problem List   Diagnosis Date Noted  . B12 deficiency 03/11/2018  . Idiopathic chronic venous hypertension of both lower extremities with ulcer and inflammation ([place]) 09/18/2017  . Bilateral lower extremity edema 06/30/2017  . Physical deconditioning 05/30/2017  . Immunosuppressed status ([place]) 05/30/2017  . Abnormal chest x-ray 05/17/2017  . Fibromyalgia 12/18/2016  . DJD (degenerative joint disease), cervical 12/18/2016  . Spondylosis of lumbar region without myelopathy or radiculopathy 12/18/2016  . Trigger finger, right middle finger 12/18/2016  . Primary osteoarthritis of both feet 12/18/2016  . Primary osteoarthritis of both knees 12/18/2016  . GERD (gastroesophageal reflux disease) 12/18/2016  . Age-related osteoporosis without current pathological fracture 12/18/2016  . Rheumatoid arthritis ([place]) 12/28/2015  . Long  term current use of systemic steroids, for RA 12/28/2015  . Lymphocytosis 11/13/2012   Social History   Tobacco Use  . Smoking status: Former Smoker    Packs/day: 0.75    Years: 10.00    Pack years: 7.50    Types: Cigarettes    Last attempt to quit: 1968    Years since quitting: 52.2  . Smokeless tobacco: Never Used  Substance Use Topics  . Alcohol use: No  . Drug use: No   Current Medications and Allergies   .  acetaminophen (TYLENOL) 500 MG tablet, Take 500 mg by mouth every 6 (six) hours as needed., Disp: , Rfl:  .  atorvastatin (LIPITOR) 20 MG tablet, TAKE 1 TABLET BY MOUTH AT BEDTIME AS NEEDED, Disp: 90 tablet, Rfl: 0 .  Biotin (BIOTIN MAXIMUM STRENGTH) 10 MG TABS, Take 10 mg by mouth daily., Disp: , Rfl:  .  Cholecalciferol (VITAMIN D3) 5000 units CAPS, Take 5,000 Units by mouth daily., Disp: , Rfl:  .  CRANBERRY PO, Take 1 capsule by mouth daily., Disp: , Rfl:  .  cyanocobalamin (,VITAMIN B-12,) 1000 MCG/ML injection, 1000 mcg injection once per month., Disp: 6 mL, Rfl: 0 .  diclofenac sodium (VOLTAREN) 1 % GEL, APPLY 3 GRAM TOPICALLY TO THE 3 LARGEST JOINTS THREE TIMES DAILY AS NEEDED, Disp: , Rfl: 5 .  EPINEPHrine 0.3 mg/0.3 mL IJ SOAJ injection, INJECT 0.3MLS INTO THE MUSCLE ONCE FOR A ONE TIME DOSE, Disp: , Rfl: 2 .  fexofenadine (ALLEGRA) 180 MG tablet, Take 180 mg by mouth daily., Disp: , Rfl:  .  fluticasone (FLONASE) 50 MCG/ACT nasal spray, USE 2 SPRAY(S) IN EACH NOSTRIL ONCE DAILY, Disp: 16 g, Rfl: 3 .  furosemide (LASIX) 80 MG tablet, TAKE 1 TABLET BY MOUTH ONCE DAILY, Disp: 30 tablet, Rfl: 0 .  hydroxychloroquine (PLAQUENIL) 200 MG tablet, TAKE 1 TABLET BY MOUTH ONCE DAILY, Disp: 90 tablet, Rfl: 0 .  ipratropium (ATROVENT) 0.03 % nasal spray, Place 2 sprays into both nostrils 3 (three) times daily., Disp: 30 mL, Rfl: 3 .  lansoprazole (PREVACID) 30 MG capsule, Take 1 capsule (30 mg total) by mouth daily at 12 noon., Disp: 90 capsule, Rfl: 3 .  montelukast  (SINGULAIR) 10 MG tablet, TAKE 1 TABLET BY MOUTH AT BEDTIME, Disp: 30 tablet, Rfl: 3 .  potassium chloride (K-DUR,KLOR-CON) 10 MEQ tablet, TAKE 1 TABLET BY MOUTH TWICE DAILY, Disp: 60 tablet, Rfl: 0 .  predniSONE (DELTASONE) 10 MG tablet, Take 1 tablet (10 mg total) by mouth daily with breakfast., Disp: 30 tablet, Rfl: 1 .  predniSONE (DELTASONE) 5 MG tablet, TAKE 1 TABLET BY MOUTH WITH BREAKFAST AND  TAPER  AS  INSTRUCTED, Disp: 30 tablet, Rfl: 0 .  Syringe/Needle, Disp, (SYRINGE 3CC/25GX1") 25G X 1" 3 ML MISC, 1 application by Does not apply route once a week., Disp: 12 each, Rfl: 0 .  traMADol (ULTRAM) 50 MG tablet, Take 1 tablet (50 mg total) by mouth 4 (four) times daily as needed., Disp: 120 tablet, Rfl: 3   Allergies  Allergen Reactions  . Adhesive [Tape] Rash  . Celebrex [Celecoxib] Hives  . Ciprofibrate Nausea Only  . Cymbalta [Duloxetine Hcl] Swelling  . Gabitril [Tiagabine] Swelling  . Keflex [Cephalexin] Nausea And Vomiting  . Lyrica [Pregabalin] Swelling  . Neurontin [Gabapentin] Swelling  . Nexium [Esomeprazole] Rash  . Nsaids Rash  . Penicillins Rash    Injection site reaction. Tolerated cefepime in past Has patient had a PCN reaction causing immediate rash, facial/tongue/throat swelling, SOB or lightheadedness with hypotension: No Has patient had a PCN reaction causing severe rash involving mucus membranes or skin necrosis: No Has patient had a PCN reaction that required hospitalization No Has patient had a PCN reaction occurring within the last 10 years: No If all of the above answers are "NO", then may proceed with Cephalosporin use.   . Shrimp [Shellfish Allergy] Anaphylaxis    Per patient "shrimp only"  . Sulfa Antibiotics Nausea And Vomiting  . Azactam [Aztreonam]     Hand swelling   . Azelastine Hcl     Rash   . Ciprofloxacin Other (See Comments)    dizziness  . Methotrexate Derivatives   . Nasacort [Triamcinolone]     Dizzy   . Olopatadine Other (See  Comments)    Pain and lethargy   . Zantac [Ranitidine Hcl]   . Claritin-D 12 Hour [Loratadine-Pseudoephedrine Er] Anxiety   Review of Systems   Pertinent items are noted in the HPI. Otherwise, a complete ROS is negative.  Vitals   Vitals:   02/19/19 1256  BP: 126/78  Pulse: 93  Temp: 97.9 F (36.6 C)  TempSrc: Oral  SpO2: 94%  Weight: 166 lb 6.4 oz (75.5 kg)  Height: 5\' 4"  (1.626 m)     Body mass index is 28.56 kg/m.  Physical Exam   Physical Exam Vitals signs and nursing note reviewed.  HENT:     Head: Normocephalic and atraumatic.  Eyes:  Pupils: Pupils are equal, round, and reactive to light.  Neck:     Musculoskeletal: Normal range of motion and neck supple.  Cardiovascular:     Rate and Rhythm: Normal rate and regular rhythm.     Heart sounds: Normal heart sounds.  Pulmonary:     Effort: Pulmonary effort is normal.  Abdominal:     Palpations: Abdomen is soft.  Musculoskeletal:       Back:     Comments: Red = SK, cryotherapy Blue = Concerning lesion, shave biopsy  Skin:    General: Skin is warm.     Findings: Lesion present.  Psychiatric:        Behavior: Behavior normal.    Assessment and Plan   Lariza was seen today for skin changes and chest pain.  Diagnoses and all orders for this visit:  Skin lesion Comments: Cryotherapy, biopsies today. See below.  Orders: -     Dermatology pathology([place]) -     Dermatology pathology([place]) -     Dermatology pathology([place])  Costochondritis Comments: Okay to continue current prednisone dose, but recommend decreasing as soon as possible.   Procedure Note:   Procedure:  Skin biopsy Indication:  Changing mole (s ),  Suspicious lesion(s)  Risks including unsuccessful procedure, bleeding, infection, bruising, scar, a need for another complete procedure and others were explained to the patient in detail as well as the benefits. Informed consent was obtained and signed.    Lesion #1 on right upper back measuring 4 mm. Lesion #2 on left mid back measuring 4 mm. Lesion #3 on left lower back measuring 4-5 mm.   Skin over lesion #1, #2, #3 prepped with Betadine and alcohol and anesthetized with 1 cc of 2% lidocaine and epinephrine, using a 25-gauge 1 inch needle.  Shave biopsy with a sterile Dermablade was carried out in the usual fashion. Drysol was used for hemostasis. Band-Aid was applied with antibiotic ointment.  . Orders and follow up as documented in [place], reviewed diet, exercise and weight control, cardiovascular risk and specific lipid/LDL goals reviewed, reviewed medications and side effects in detail.  . Reviewed expectations re: course of current medical issues. . Outlined signs and symptoms indicating need for more acute intervention. . Patient verbalized understanding and all questions were answered. . Patient received an After Visit Summary.  CMA served as Education administrator during this visit. History, Physical, and Plan performed by medical provider. The above documentation has been reviewed and is accurate and complete. Briscoe Deutscher, D.O.  Briscoe Deutscher, DO [place] [place] [facility] 02/22/2019

## 2019-02-19 NOTE — Patient Instructions (Addendum)
Hand Washing Germs such as bacteria, viruses, and parasites are found everywhere. They can be in the air and water. They can also be on surfaces like food, door handles, and your skin. Every day, your hands touch germs. Many of these germs can make you and your family sick. Washing your hands is one of the best ways to lower your risk of getting and sharing germs. When should I wash my hands? You should wash your hands whenever you think they are dirty. You should also wash your hands:  Before: ? Visiting a baby or anyone with a weakened disease-fighting system (immunesystem). ? Putting in and taking out contact lenses.  After: ? Using the bathroom or helping someone else use the bathroom. ? Working or playing outside. ? Touching or taking out the garbage. ? Touching anything dirty around your home. ? Sneezing, coughing, or blowing your nose. ? Using a phone, including your mobile phone. ? Touching an animal, animal food, animal poop, or its toys or leash. ? Touching money. ? Using household cleaners or poisonous chemicals. ? Handling dirty clothes, bedding, or rags. ? Using public transportation. ? Going shopping, especially if you use a shopping cart or basket. ? Shaking hands. ? Handling livestock, such as cows or sheep.  Before and after: ? Preparing food. ? Eating. ? Visiting or taking care of someone who is sick. This includes touching used tissues, toys, and clothes. ? Changing a bandage (dressing). ? Taking care of an injury or wound. ? Giving or taking medicine. ? Preparing a bottle for a baby. ? Feeding a baby or young child. ? Changing a diaper. What is the right way to wash my hands?  1. Wet your hands with clean, running water. Turn off the water or move your hands out of the running water. 2. Apply liquid soap or bar soap to your hands. 3. Rub your hands together quickly to create lather. 4. Keep rubbing your hands together for at least 20 seconds. Thoroughly  scrub all parts of your hands. This includes scrubbing under your fingernails and between your fingers. 5. Rinse your hands with clean, running water. Do this until all the soap is gone. 6. Dry your hands using an air dryer or a clean paper or cloth towel, or let your hands air-dry. Do not use your clothing or a dirty towel to dry your hands. If you are in a public restroom, use your towel:  To turn off the water faucet.  To open the bathroom door. How can I clean my hands if I do not have soap and water?  If soap and clean water are not available, use a hand-washing wipe, spray, or gel (hand sanitizer). Use one that contains at least 60% alcohol. If you are handling food, gels are not recommended as a replacement for hand washing with soap and water. To use these products, follow the directions on the product, and:  Apply enough product to cover your hands.  Make sure you wipe, rub, or spray the product so that it reaches every part of your hands and wrists. Include the backs of your hands, between your fingers, and under your fingernails.  Rub the product onto your hands until it dries. Summary  Every day, your hands touch germs. Many of these germs can make you and your family sick.  Washing your hands is one of the best ways to lower your risk of getting and sharing germs.  If soap and clean water are not available,   use a hand-washing wipe, spray, or gel. This information is not intended to replace advice given to you by your health care provider. Make sure you discuss any questions you have with your health care provider. Document Released: 11/09/2008 Document Revised: 09/05/2017 Document Reviewed: 09/05/2017 Elsevier Interactive Patient Education  2019 Elsevier Inc.  

## 2019-02-19 NOTE — Telephone Encounter (Signed)
Patient left message asking for a return call about the MRI of knee. Attempted to contact the patient and left message for patient to call the office.  Order has been placed for the MRI. Order was placed on 02/17/19.

## 2019-02-20 ENCOUNTER — Telehealth: Payer: Self-pay | Admitting: *Deleted

## 2019-02-20 NOTE — Telephone Encounter (Signed)
Patient advised the order has been placed for the MRI. Patient advised that Dr. Estanislado Pandy is unable to order MRI's at [facility] imagining. Advised patient I would follow up with Sabrina to see where the MRI stands.

## 2019-02-22 ENCOUNTER — Ambulatory Visit (HOSPITAL_COMMUNITY)
Admission: RE | Admit: 2019-02-22 | Discharge: 2019-02-22 | Disposition: A | Discharge status: Home / Self Care | Payer: Medicare Other | Source: Ambulatory Visit | Attending: Rheumatology | Admitting: Rheumatology

## 2019-02-22 DIAGNOSIS — M25562 Pain in left knee: Secondary | ICD-10-CM | POA: Diagnosis not present

## 2019-02-24 NOTE — Progress Notes (Signed)
Please schedule appointment with orthopedics

## 2019-02-25 ENCOUNTER — Encounter (INDEPENDENT_AMBULATORY_CARE_PROVIDER_SITE_OTHER): Payer: Self-pay | Admitting: Orthopedic Surgery

## 2019-02-25 ENCOUNTER — Ambulatory Visit (INDEPENDENT_AMBULATORY_CARE_PROVIDER_SITE_OTHER): Payer: Medicare Other | Admitting: Orthopedic Surgery

## 2019-02-25 ENCOUNTER — Other Ambulatory Visit: Payer: Self-pay

## 2019-02-25 VITALS — Ht 64.0 in | Wt 166.0 lb

## 2019-02-25 DIAGNOSIS — M1712 Unilateral primary osteoarthritis, left knee: Secondary | ICD-10-CM | POA: Diagnosis not present

## 2019-02-25 MED ORDER — LIDOCAINE HCL 1 % IJ SOLN
5.0000 mL | INTRAMUSCULAR | Status: AC | PRN
  Administered 2019-02-25: 5 mL

## 2019-02-25 MED ORDER — METHYLPREDNISOLONE ACETATE 40 MG/ML IJ SUSP
40.0000 mg | INTRAMUSCULAR | Status: AC | PRN
  Administered 2019-02-25: 40 mg via INTRA_ARTICULAR

## 2019-02-25 NOTE — Progress Notes (Signed)
Office Visit Note   Patient: Eileen Wilkerson           Date of Birth: June 05, 1932           MRN: [id number] Visit Date: 02/25/2019              Requested by: Briscoe Deutscher, [address], [place] [address] PCP: Briscoe Deutscher, DO  Chief Complaint  Patient presents with  . Left Knee - Pain      HPI: Patient is a 85 year old woman with autoimmune arthritis as well as osteoarthritis.  Patient has been having pain in her left knee she describes this that is a 7 out of 10.  Patient states she has had 2 doctors appointments today and her knee pain is getting worse.  She has had 1 previous injection in the left knee.  Patient states that the acute pain started about a month ago.  Assessment & Plan: Visit Diagnoses:  1. Unilateral primary osteoarthritis, left knee     Plan: Left knee was injected reevaluate in 3 weeks.  Discussed that we could proceed with an additional injection.  Discussed that arthroscopic intervention has about a 50% chance of improving the symptoms in her arthritic knee.  Discussed that a total knee arthroplasty is an option if we fail conservative treatment.  Follow-Up Instructions: Return in about 3 weeks (around 03/18/2019).   Ortho Exam  Patient is alert, oriented, no adenopathy, well-dressed, normal affect, normal respiratory effort. Examination patient has difficulty getting from a sitting to a standing position she uses a cane.  She has no effusion there is no redness no cellulitis collaterals and cruciates are stable she is tender to palpation over the medial and lateral joint line.  Review of the MRI scan shows tricompartmental degenerative changes with degenerative meniscal changes and a small effusion and small Baker's cyst.  Her radiographs were also reviewed which shows joint space narrowing she has valgus alignment to the left knee and she has periarticular bony spurs in all 3 compartments.  Imaging: No results found. No images are  attached to the encounter.  Labs: Lab Results  Component Value Date   ESRSEDRATE 74 (H) 05/17/2017   CRP 2.0 05/17/2017   REPTSTATUS 02/03/2018 FINAL 02/03/2018   REPTSTATUS 02/06/2018 FINAL 02/03/2018   GRAMSTAIN  02/03/2018    MODERATE WBC PRESENT, PREDOMINANTLY MONONUCLEAR MODERATE SQUAMOUS EPITHELIAL CELLS PRESENT MODERATE GRAM POSITIVE RODS MODERATE GRAM POSITIVE COCCI IN PAIRS IN CLUSTERS FEW GRAM NEGATIVE COCCI IN PAIRS RARE GRAM NEGATIVE RODS    CULT  02/03/2018    ABUNDANT Consistent with normal respiratory flora. Performed at [facility], [place] [address] [place], [place] [address]      Lab Results  Component Value Date   ALBUMIN 4.4 01/24/2019   ALBUMIN 4.3 01/13/2019   ALBUMIN 4.7 12/30/2018    Body mass index is 28.49 kg/m.  Orders:  No orders of the defined types were placed in this encounter.  No orders of the defined types were placed in this encounter.    Procedures: Large Joint Inj: L knee on 02/25/2019 5:09 PM Indications: pain and diagnostic evaluation Details: 22 G 1.5 in needle, anteromedial approach  Arthrogram: No  Medications: 5 mL lidocaine 1 %; 40 mg methylPREDNISolone acetate 40 MG/ML Outcome: tolerated well, no immediate complications Procedure, treatment alternatives, risks and benefits explained, specific risks discussed. Consent was given by the patient. Immediately prior to procedure a time out was called to verify the correct  patient, procedure, equipment, support staff and site/side marked as required. Patient was prepped and draped in the usual sterile fashion.      Clinical Data: No additional findings.  ROS:  All other systems negative, except as noted in the HPI. Review of Systems  Objective: Vital Signs: Ht 5\' 4"  (1.626 m)   Wt 166 lb (75.3 kg)   BMI 28.49 kg/m   Specialty Comments:  No specialty comments available.  PMFS History: Patient Active Problem List   Diagnosis Date Noted  . B12 deficiency  03/11/2018  . Idiopathic chronic venous hypertension of both lower extremities with ulcer and inflammation ([place]) 09/18/2017  . Bilateral lower extremity edema 06/30/2017  . Physical deconditioning 05/30/2017  . Immunosuppressed status ([place]) 05/30/2017  . Abnormal chest x-ray 05/17/2017  . Fibromyalgia 12/18/2016  . DJD (degenerative joint disease), cervical 12/18/2016  . Spondylosis of lumbar region without myelopathy or radiculopathy 12/18/2016  . Trigger finger, right middle finger 12/18/2016  . Primary osteoarthritis of both feet 12/18/2016  . Primary osteoarthritis of both knees 12/18/2016  . GERD (gastroesophageal reflux disease) 12/18/2016  . Age-related osteoporosis without current pathological fracture 12/18/2016  . Rheumatoid arthritis ([place]) 12/28/2015  . Long term current use of systemic steroids, for RA 12/28/2015  . Lymphocytosis 11/13/2012   Past Medical History:  Diagnosis Date  . Adrenal failure ([place])   . Arthritis   . Asthma   . Cataract   . Closed nondisplaced fracture of fifth right metatarsal bone 09/18/2017  . Fibromyalgia 2008  . HCAP (healthcare-associated pneumonia) 02/03/2018  . Osteoporosis   . RA (rheumatoid arthritis) ([place])   . Recurrent upper respiratory infection (URI)   . Sepsis due to urinary tract infection ([place]) 01/18/2018  . Urticaria     Family History  Problem Relation Age of Onset  . Heart attack Maternal Grandmother   . Heart attack Paternal Grandfather   . Breast cancer Mother   . Diabetes Father   . Heart disease Father   . Allergic rhinitis Neg Hx   . Asthma Neg Hx   . Eczema Neg Hx   . Urticaria Neg Hx     Past Surgical History:  Procedure Laterality Date  . BACK SURGERY    . BILATERAL CARPAL TUNNEL RELEASE    . CATARACT EXTRACTION, BILATERAL  2004  . CERVICAL FUSION  2011,2010,2008  . HEEL SPUR SURGERY  2004  . ROTATOR CUFF REPAIR    . TONSILLECTOMY AND ADENOIDECTOMY  1947  . TOTAL SHOULDER ARTHROPLASTY     Social History    Occupational History  . Not on file  Tobacco Use  . Smoking status: Former Smoker    Packs/day: 0.75    Years: 10.00    Pack years: 7.50    Types: Cigarettes    Last attempt to quit: 1968    Years since quitting: 52.2  . Smokeless tobacco: Never Used  Substance and Sexual Activity  . Alcohol use: No  . Drug use: No  . Sexual activity: Not Currently

## 2019-02-26 ENCOUNTER — Other Ambulatory Visit: Payer: Self-pay | Admitting: Family Medicine

## 2019-02-26 DIAGNOSIS — H1013 Acute atopic conjunctivitis, bilateral: Secondary | ICD-10-CM

## 2019-02-26 DIAGNOSIS — J309 Allergic rhinitis, unspecified: Secondary | ICD-10-CM

## 2019-03-06 ENCOUNTER — Other Ambulatory Visit: Payer: Self-pay | Admitting: Family Medicine

## 2019-03-06 DIAGNOSIS — R600 Localized edema: Secondary | ICD-10-CM

## 2019-03-17 ENCOUNTER — Telehealth (INDEPENDENT_AMBULATORY_CARE_PROVIDER_SITE_OTHER): Payer: Self-pay

## 2019-03-17 NOTE — Telephone Encounter (Signed)
I caled pt

## 2019-03-17 NOTE — Telephone Encounter (Signed)
I called pt and asked all prescreen COVID-19 questions and pt answered NO. Has an appt tomorrow at1:15

## 2019-03-18 ENCOUNTER — Ambulatory Visit (INDEPENDENT_AMBULATORY_CARE_PROVIDER_SITE_OTHER): Payer: Medicare Other | Admitting: Physician Assistant

## 2019-03-18 ENCOUNTER — Other Ambulatory Visit: Payer: Self-pay

## 2019-03-18 ENCOUNTER — Encounter (INDEPENDENT_AMBULATORY_CARE_PROVIDER_SITE_OTHER): Payer: Self-pay | Admitting: Orthopedic Surgery

## 2019-03-18 VITALS — Ht 64.0 in | Wt 166.0 lb

## 2019-03-18 DIAGNOSIS — M1712 Unilateral primary osteoarthritis, left knee: Secondary | ICD-10-CM | POA: Diagnosis not present

## 2019-03-18 MED ORDER — LIDOCAINE HCL 1 % IJ SOLN
5.0000 mL | INTRAMUSCULAR | Status: AC | PRN
  Administered 2019-03-18: 5 mL

## 2019-03-18 MED ORDER — METHYLPREDNISOLONE ACETATE 40 MG/ML IJ SUSP
40.0000 mg | INTRAMUSCULAR | Status: AC | PRN
  Administered 2019-03-18: 15:00:00 40 mg via INTRA_ARTICULAR

## 2019-03-18 NOTE — Progress Notes (Signed)
Office Visit Note   Patient: Eileen Wilkerson           Date of Birth: Oct 01, 1932           MRN: [id number] Visit Date: 03/18/2019              Requested by: Briscoe Deutscher, [address] [place], [place] [address] PCP: Briscoe Deutscher, DO  Chief Complaint  Patient presents with  . Left Knee - Follow-up    S/p cortisone injection 02/25/2019      HPI: The patient is a 85 yo woman who is seen for follow up of her left knee. She has had a couple of steroid injections and feels they helped a great deal and she requests another injection today. She has not had any difficulties following the injections and had good results from the injections in the past.   Assessment & Plan: Visit Diagnoses:  1. Unilateral primary osteoarthritis, left knee     Plan: After informed consent the patient underwent left knee steroid injection without difficulty.  She will follow up prn.   Follow-Up Instructions: Return if symptoms worsen or fail to improve.   Ortho Exam  Patient is alert, oriented, no adenopathy, well-dressed, normal affect, normal respiratory effort. The left knee has a small effusion, medial joint line tenderness> lateral. Crepitus with range of motion. No signs of infection or cellulitis.   Imaging: No results found. No images are attached to the encounter.  Labs: Lab Results  Component Value Date   ESRSEDRATE 74 (H) 05/17/2017   CRP 2.0 05/17/2017   REPTSTATUS 02/03/2018 FINAL 02/03/2018   REPTSTATUS 02/06/2018 FINAL 02/03/2018   GRAMSTAIN  02/03/2018    MODERATE WBC PRESENT, PREDOMINANTLY MONONUCLEAR MODERATE SQUAMOUS EPITHELIAL CELLS PRESENT MODERATE GRAM POSITIVE RODS MODERATE GRAM POSITIVE COCCI IN PAIRS IN CLUSTERS FEW GRAM NEGATIVE COCCI IN PAIRS RARE GRAM NEGATIVE RODS    CULT  02/03/2018    ABUNDANT Consistent with normal respiratory flora. Performed at [facility], [place] [address], [place], [place] [address]      Lab Results  Component  Value Date   ALBUMIN 4.4 01/24/2019   ALBUMIN 4.3 01/13/2019   ALBUMIN 4.7 12/30/2018    Body mass index is 28.49 kg/m.  Orders:  Orders Placed This Encounter  Procedures  . Large Joint Inj   No orders of the defined types were placed in this encounter.    Procedures: Large Joint Inj: L knee on 03/18/2019 2:42 PM Indications: pain and diagnostic evaluation Details: 22 G 1.5 in needle, anteromedial approach  Arthrogram: No  Medications: 5 mL lidocaine 1 %; 40 mg methylPREDNISolone acetate 40 MG/ML Outcome: tolerated well, no immediate complications Procedure, treatment alternatives, risks and benefits explained, specific risks discussed. Consent was given by the patient. Immediately prior to procedure a time out was called to verify the correct patient, procedure, equipment, support staff and site/side marked as required. Patient was prepped and draped in the usual sterile fashion.      Clinical Data: No additional findings.  ROS:  All other systems negative, except as noted in the HPI. Review of Systems  Objective: Vital Signs: Ht 5\' 4"  (1.626 m)   Wt 166 lb (75.3 kg)   BMI 28.49 kg/m   Specialty Comments:  No specialty comments available.  PMFS History: Patient Active Problem List   Diagnosis Date Noted  . B12 deficiency 03/11/2018  . Idiopathic chronic venous hypertension of both lower extremities with ulcer and inflammation ([place])  09/18/2017  . Bilateral lower extremity edema 06/30/2017  . Physical deconditioning 05/30/2017  . Immunosuppressed status ([place]) 05/30/2017  . Abnormal chest x-ray 05/17/2017  . Fibromyalgia 12/18/2016  . DJD (degenerative joint disease), cervical 12/18/2016  . Spondylosis of lumbar region without myelopathy or radiculopathy 12/18/2016  . Trigger finger, right middle finger 12/18/2016  . Primary osteoarthritis of both feet 12/18/2016  . Primary osteoarthritis of both knees 12/18/2016  . GERD (gastroesophageal reflux disease)  12/18/2016  . Age-related osteoporosis without current pathological fracture 12/18/2016  . Rheumatoid arthritis ([place]) 12/28/2015  . Long term current use of systemic steroids, for RA 12/28/2015  . Lymphocytosis 11/13/2012   Past Medical History:  Diagnosis Date  . Adrenal failure ([place])   . Arthritis   . Asthma   . Cataract   . Closed nondisplaced fracture of fifth right metatarsal bone 09/18/2017  . Fibromyalgia 2008  . HCAP (healthcare-associated pneumonia) 02/03/2018  . Osteoporosis   . RA (rheumatoid arthritis) ([place])   . Recurrent upper respiratory infection (URI)   . Sepsis due to urinary tract infection ([place]) 01/18/2018  . Urticaria     Family History  Problem Relation Age of Onset  . Heart attack Maternal Grandmother   . Heart attack Paternal Grandfather   . Breast cancer Mother   . Diabetes Father   . Heart disease Father   . Allergic rhinitis Neg Hx   . Asthma Neg Hx   . Eczema Neg Hx   . Urticaria Neg Hx     Past Surgical History:  Procedure Laterality Date  . BACK SURGERY    . BILATERAL CARPAL TUNNEL RELEASE    . CATARACT EXTRACTION, BILATERAL  2004  . CERVICAL FUSION  2011,2010,2008  . HEEL SPUR SURGERY  2004  . ROTATOR CUFF REPAIR    . TONSILLECTOMY AND ADENOIDECTOMY  1947  . TOTAL SHOULDER ARTHROPLASTY     Social History   Occupational History  . Not on file  Tobacco Use  . Smoking status: Former Smoker    Packs/day: 0.75    Years: 10.00    Pack years: 7.50    Types: Cigarettes    Last attempt to quit: 1968    Years since quitting: 52.3  . Smokeless tobacco: Never Used  Substance and Sexual Activity  . Alcohol use: No  . Drug use: No  . Sexual activity: Not Currently

## 2019-03-27 ENCOUNTER — Other Ambulatory Visit: Payer: Self-pay | Admitting: Family Medicine

## 2019-03-27 NOTE — Telephone Encounter (Signed)
Last OV 02/19/2019 Last refill 12/31/18 #90/0 Next OV 06/03/19

## 2019-04-01 ENCOUNTER — Ambulatory Visit: Payer: Medicare Other | Admitting: Family Medicine

## 2019-04-02 ENCOUNTER — Encounter: Payer: Self-pay | Admitting: Rheumatology

## 2019-04-03 MED ORDER — PREDNISONE 5 MG PO TABS: ORAL_TABLET | ORAL | 0 refills | Status: DC

## 2019-04-03 MED ORDER — PREDNISONE 1 MG PO TABS: 2.0000 mg | ORAL_TABLET | Freq: Every day | ORAL | 0 refills | Status: DC

## 2019-04-03 NOTE — Telephone Encounter (Signed)
Last visit: 02/11/19 Next Visit: 06/10/19  Okay to refill per Dr. Estanislado Pandy

## 2019-04-18 ENCOUNTER — Ambulatory Visit: Payer: Medicare Other

## 2019-04-18 ENCOUNTER — Ambulatory Visit: Payer: Medicare Other | Admitting: Physician Assistant

## 2019-04-18 ENCOUNTER — Encounter: Payer: Self-pay | Admitting: Physician Assistant

## 2019-04-18 ENCOUNTER — Other Ambulatory Visit: Payer: Self-pay

## 2019-04-18 VITALS — Ht 64.0 in | Wt 166.0 lb

## 2019-04-18 DIAGNOSIS — M25571 Pain in right ankle and joints of right foot: Secondary | ICD-10-CM

## 2019-04-18 NOTE — Progress Notes (Signed)
Office Visit Note   Patient: Eileen Wilkerson           Date of Birth: Mar 05, 1932           MRN: [id number] Visit Date: 04/18/2019              Requested by: Briscoe Deutscher, [address] [place], [place] [address] PCP: Briscoe Deutscher, DO  Chief Complaint  Patient presents with  . Right Ankle - Edema      HPI: The patient is a 85 yo woman who is seen for complaint of right ankle swelling. Reports she notices the swelling at night. She turned her right ankle/foot several weeks ago and was worried about the swelling. She denies much pain over the ankle, but does note chronic knee and back pain. She would like something to brace and support the ankle when she walks.    Assessment & Plan: Visit Diagnoses:  1. Pain in right ankle and joints of right foot     Plan: Counseled the xrays of the right ankle show arthritis, but no acute fractures.  ASO provided for the patient to try when up walking. Counseled she may also benefit from compression socks, but she is unwilling to try again right now.  She will follow up in several weeks if not improved.   Follow-Up Instructions: Return in about 4 weeks (around 05/16/2019), or if symptoms worsen or fail to improve.   Ortho Exam  Patient is alert, oriented, no adenopathy, well-dressed, normal affect, normal respiratory effort. The patient has mild edema localized to the right ankle . Non tender to palpation. Dorsiflexion slightly greater than neutral and non tender. She has no signs of infection , no signs of cellulitis, no signs of gout. No pain with range of motion of the ankle of foot . No instability with varus/valgus stress. Good pedal pulses. Varicosities noted. Patient not willing to try compression socks at this point.   Imaging: No results found. No images are attached to the encounter.  Labs: Lab Results  Component Value Date   ESRSEDRATE 74 (H) 05/17/2017   CRP 2.0 05/17/2017   REPTSTATUS 02/03/2018 FINAL 02/03/2018    REPTSTATUS 02/06/2018 FINAL 02/03/2018   GRAMSTAIN  02/03/2018    MODERATE WBC PRESENT, PREDOMINANTLY MONONUCLEAR MODERATE SQUAMOUS EPITHELIAL CELLS PRESENT MODERATE GRAM POSITIVE RODS MODERATE GRAM POSITIVE COCCI IN PAIRS IN CLUSTERS FEW GRAM NEGATIVE COCCI IN PAIRS RARE GRAM NEGATIVE RODS    CULT  02/03/2018    ABUNDANT Consistent with normal respiratory flora. Performed at [facility], [place] [address], [place], [place] [address]      Lab Results  Component Value Date   ALBUMIN 4.4 01/24/2019   ALBUMIN 4.3 01/13/2019   ALBUMIN 4.7 12/30/2018    Body mass index is 28.49 kg/m.  Orders:  Orders Placed This Encounter  Procedures  . XR Ankle 2 Views Right   No orders of the defined types were placed in this encounter.    Procedures: No procedures performed  Clinical Data: No additional findings.  ROS:  All other systems negative, except as noted in the HPI. Review of Systems  Objective: Vital Signs: Ht 5\' 4"  (1.626 m)   Wt 166 lb (75.3 kg)   BMI 28.49 kg/m   Specialty Comments:  No specialty comments available.  PMFS History: Patient Active Problem List   Diagnosis Date Noted  . B12 deficiency 03/11/2018  . Idiopathic chronic venous hypertension of both lower extremities with ulcer and  inflammation ([place]) 09/18/2017  . Bilateral lower extremity edema 06/30/2017  . Physical deconditioning 05/30/2017  . Immunosuppressed status ([place]) 05/30/2017  . Abnormal chest x-ray 05/17/2017  . Fibromyalgia 12/18/2016  . DJD (degenerative joint disease), cervical 12/18/2016  . Spondylosis of lumbar region without myelopathy or radiculopathy 12/18/2016  . Trigger finger, right middle finger 12/18/2016  . Primary osteoarthritis of both feet 12/18/2016  . Primary osteoarthritis of both knees 12/18/2016  . GERD (gastroesophageal reflux disease) 12/18/2016  . Age-related osteoporosis without current pathological fracture 12/18/2016  . Rheumatoid arthritis ([place])  12/28/2015  . Long term current use of systemic steroids, for RA 12/28/2015  . Lymphocytosis 11/13/2012   Past Medical History:  Diagnosis Date  . Adrenal failure ([place])   . Arthritis   . Asthma   . Cataract   . Closed nondisplaced fracture of fifth right metatarsal bone 09/18/2017  . Fibromyalgia 2008  . HCAP (healthcare-associated pneumonia) 02/03/2018  . Osteoporosis   . RA (rheumatoid arthritis) ([place])   . Recurrent upper respiratory infection (URI)   . Sepsis due to urinary tract infection ([place]) 01/18/2018  . Urticaria     Family History  Problem Relation Age of Onset  . Heart attack Maternal Grandmother   . Heart attack Paternal Grandfather   . Breast cancer Mother   . Diabetes Father   . Heart disease Father   . Allergic rhinitis Neg Hx   . Asthma Neg Hx   . Eczema Neg Hx   . Urticaria Neg Hx     Past Surgical History:  Procedure Laterality Date  . BACK SURGERY    . BILATERAL CARPAL TUNNEL RELEASE    . CATARACT EXTRACTION, BILATERAL  2004  . CERVICAL FUSION  2011,2010,2008  . HEEL SPUR SURGERY  2004  . ROTATOR CUFF REPAIR    . TONSILLECTOMY AND ADENOIDECTOMY  1947  . TOTAL SHOULDER ARTHROPLASTY     Social History   Occupational History  . Not on file  Tobacco Use  . Smoking status: Former Smoker    Packs/day: 0.75    Years: 10.00    Pack years: 7.50    Types: Cigarettes    Last attempt to quit: 1968    Years since quitting: 52.3  . Smokeless tobacco: Never Used  Substance and Sexual Activity  . Alcohol use: No  . Drug use: No  . Sexual activity: Not Currently

## 2019-04-23 ENCOUNTER — Other Ambulatory Visit: Payer: Self-pay | Admitting: Rheumatology

## 2019-04-23 NOTE — Telephone Encounter (Signed)
Last Visit: 02/11/19 Next Visit: 06/10/19 Labs: 01/24/19 Glucose 106 BUN 38 GFR 44.91 WBC 14.3 Neutrophils 85.5 Lymphocytes 10.8 Neutro Abs 12.2  PLQ eye exam:10/22/18 Normal.  Okay to refill PLQ?

## 2019-04-23 NOTE — Telephone Encounter (Signed)
ok 

## 2019-04-30 ENCOUNTER — Other Ambulatory Visit: Payer: Self-pay | Admitting: Rheumatology

## 2019-04-30 NOTE — Telephone Encounter (Signed)
Last Visit: 02/11/19 Next visit: 06/10/19  Okay to refill per Dr. Estanislado Pandy

## 2019-05-07 ENCOUNTER — Other Ambulatory Visit: Payer: Self-pay | Admitting: Family Medicine

## 2019-05-07 DIAGNOSIS — M797 Fibromyalgia: Secondary | ICD-10-CM

## 2019-05-07 DIAGNOSIS — M0579 Rheumatoid arthritis with rheumatoid factor of multiple sites without organ or systems involvement: Secondary | ICD-10-CM

## 2019-05-08 NOTE — Telephone Encounter (Signed)
Last OV 02/19/19 Last refill 12/30/18 #120/3 Next OV 06/03/19  Forwarding to Dr. Juleen China

## 2019-05-28 NOTE — Progress Notes (Signed)
Office Visit Note  Patient: Eileen Wilkerson             Date of Birth: [birth year]           MRN: [id number]             PCP: Briscoe Deutscher, DO Referring: Briscoe Deutscher, DO Visit Date: 06/10/2019 Occupation: '@GUAROCC' @  Subjective:  Generalized pain    History of Present Illness: Eileen Wilkerson is a 85 y.o. female with history of seropositive rheumatoid arthritis, osteoarthritis, fibromyalgia, and osteoporosis.  She is taking Plaquenil 200 mg 1 tablet by mouth daily and prednisone 7 mg po daily.  She denies any recent rheumatoid arthritis flares.  She denies any joint swelling. She reports she was previously having severe left knee joint pain, but she had a cortisone injection on 02/11/19, 02/25/19, and 03/18/19, which has resolved her discomfort.  She had an MRI of the left knee on 02/22/2019 and was referred to Dr. Sharol Given.   She continues have generalized muscle aches muscle tenderness due to fibromyalgia.  She has chronic fatigue related to insomnia. She continues to see Dr. Maryjean Ka at pain management.  She was having severe lower back pain and is undergone several radiofrequency ablations which has been improving her discomfort.  He says that she is also experiencing bilateral inner bursitis 3 to 4 weeks ago and had cortisone injections which resolved her symptoms.  She has no new concerns at this time.     Activities of Daily Living:  Patient reports morning stiffness for 2 hours.   Patient Reports nocturnal pain.  Difficulty dressing/grooming: Denies Difficulty climbing stairs: Denies Difficulty getting out of chair: Denies Difficulty using hands for taps, buttons, cutlery, and/or writing: Denies  Review of Systems  Constitutional: Positive for fatigue.  HENT: Positive for mouth dryness. Negative for mouth sores and nose dryness.   Eyes: Positive for dryness. Negative for pain and visual disturbance.  Respiratory: Negative for cough, hemoptysis, shortness of breath and  difficulty breathing.   Cardiovascular: Positive for swelling in legs/feet. Negative for chest pain, palpitations and hypertension.  Gastrointestinal: Positive for constipation and diarrhea. Negative for blood in stool.  Endocrine: Negative for increased urination.  Genitourinary: Negative for painful urination.  Musculoskeletal: Positive for arthralgias, joint pain and morning stiffness. Negative for joint swelling, myalgias, muscle weakness, muscle tenderness and myalgias.  Skin: Negative for color change, pallor, rash, hair loss, nodules/bumps, skin tightness, ulcers and sensitivity to sunlight.  Allergic/Immunologic: Negative for susceptible to infections.  Neurological: Negative for dizziness, numbness, headaches and weakness.  Hematological: Negative for swollen glands.  Psychiatric/Behavioral: Positive for depressed mood and sleep disturbance. The patient is nervous/anxious.     PMFS History:  Patient Active Problem List   Diagnosis Date Noted  . B12 deficiency 03/11/2018  . Idiopathic chronic venous hypertension of both lower extremities with ulcer and inflammation ([place]) 09/18/2017  . Bilateral lower extremity edema 06/30/2017  . Physical deconditioning 05/30/2017  . Immunosuppressed status ([place]) 05/30/2017  . Abnormal chest x-ray 05/17/2017  . Fibromyalgia 12/18/2016  . DJD (degenerative joint disease), cervical 12/18/2016  . Spondylosis of lumbar region without myelopathy or radiculopathy 12/18/2016  . Trigger finger, right middle finger 12/18/2016  . Primary osteoarthritis of both feet 12/18/2016  . Primary osteoarthritis of both knees 12/18/2016  . GERD (gastroesophageal reflux disease) 12/18/2016  . Age-related osteoporosis without current pathological fracture 12/18/2016  . Rheumatoid arthritis ([place]) 12/28/2015  . Long term current use of systemic steroids, for RA 12/28/2015  .  Lymphocytosis 11/13/2012    Past Medical History:  Diagnosis Date  . Adrenal failure ([place])    . Arthritis   . Asthma   . Cataract   . Closed nondisplaced fracture of fifth right metatarsal bone 09/18/2017  . Fibromyalgia 2008  . HCAP (healthcare-associated pneumonia) 02/03/2018  . Osteoporosis   . RA (rheumatoid arthritis) ([place])   . Recurrent upper respiratory infection (URI)   . Sepsis due to urinary tract infection ([place]) 01/18/2018  . Urticaria     Family History  Problem Relation Age of Onset  . Heart attack Maternal Grandmother   . Heart attack Paternal Grandfather   . Breast cancer Mother   . Diabetes Father   . Heart disease Father   . Allergic rhinitis Neg Hx   . Asthma Neg Hx   . Eczema Neg Hx   . Urticaria Neg Hx    Past Surgical History:  Procedure Laterality Date  . BACK SURGERY    . BILATERAL CARPAL TUNNEL RELEASE    . CATARACT EXTRACTION, BILATERAL  2004  . CERVICAL FUSION  2011,2010,2008  . HEEL SPUR SURGERY  2004  . [place]  . ROTATOR CUFF REPAIR    . TONSILLECTOMY AND ADENOIDECTOMY  1947  . TOTAL SHOULDER ARTHROPLASTY     Social History   Social History Narrative  . Not on file   Immunization History  Administered Date(s) Administered  . Influenza, High Dose Seasonal PF 08/16/2018  . Influenza,inj,Quad PF,6+ Mos 08/11/2016  . Influenza-Unspecified 11/12/2017  . PPD Test 03/01/2018  . Pneumococcal Conjugate-13 06/26/2017  . Zoster Recombinat (Shingrix) 02/04/2019     Objective: Vital Signs: BP (!) 146/89 (BP Location: Left Arm, Patient Position: Sitting, Cuff Size: Normal)   Pulse 83   Resp 13   Ht '5\' 4"'  (1.626 m)   Wt 170 lb (77.1 kg)   BMI 29.18 kg/m    Physical Exam Vitals signs and nursing note reviewed.  Constitutional:      Appearance: She is well-developed.  HENT:     Head: Normocephalic and atraumatic.  Eyes:     Conjunctiva/sclera: Conjunctivae normal.  Neck:     Musculoskeletal: Normal range of motion.  Cardiovascular:     Rate and Rhythm: Normal rate and regular rhythm.     Heart sounds:  Normal heart sounds.  Pulmonary:     Effort: Pulmonary effort is normal.     Breath sounds: Normal breath sounds.  Abdominal:     General: Bowel sounds are normal.     Palpations: Abdomen is soft.  Lymphadenopathy:     Cervical: No cervical adenopathy.  Skin:    General: Skin is warm and dry.     Capillary Refill: Capillary refill takes less than 2 seconds.  Neurological:     Mental Status: She is alert and oriented to person, place, and time.  Psychiatric:        Behavior: Behavior normal.      Musculoskeletal Exam: C-spine limited range of motion.  Thoracic kyphosis noted.  She has limited range of motion of the lumbar spine. No midline spinal tenderness.  No SI joint tenderness. Shoulder joints, elbow joints, wrist joints, MCPs, PIPs, DIPs good range of motion with no synovitis.  She has PIP and DIP synovial thickening consistent with osteoarthritis of bilateral hands.  She has complete fist motion bilaterally.  She has bilateral CMC joint synovial thickening.  Hip joints have good range of motion with no discomfort.  Left knee has good  range of motion with no warmth or effusion.  No tenderness or swelling of ankle joints.  Pedal edema noted bilaterally.   CDAI Exam: CDAI Score: - Patient Global: -; Provider Global: - Swollen: -; Tender: - Joint Exam   No joint exam has been documented for this visit   There is currently no information documented on the homunculus. Go to the Rheumatology activity and complete the homunculus joint exam.  Investigation: No additional findings.  Imaging: Dg Chest 2 View  Result Date: 06/09/2019 CLINICAL DATA:  Cough for 1 week EXAM: CHEST - 2 VIEW COMPARISON:  01/24/2011 FINDINGS: Heart is normal size. Linear densities at the left base are unchanged, likely scarring or chronic atelectasis. No acute confluent airspace opacities or effusions. No acute bony abnormality. IMPRESSION: Left base scarring or atelectasis.  No change since prior study.  Electronically Signed   By: Rolm Baptise M.D.   On: 06/09/2019 20:52    Recent Labs: Lab Results  Component Value Date   WBC 11.4 (H) 06/03/2019   HGB 12.9 06/03/2019   PLT 260.0 06/03/2019   NA 141 06/03/2019   K 4.9 06/03/2019   CL 102 06/03/2019   CO2 29 06/03/2019   GLUCOSE 146 (H) 06/03/2019   BUN 33 (H) 06/03/2019   CREATININE 0.98 06/03/2019   BILITOT 0.5 06/03/2019   ALKPHOS 35 (L) 06/03/2019   AST 19 06/03/2019   ALT 21 06/03/2019   PROT 6.5 06/03/2019   ALBUMIN 4.0 06/03/2019   CALCIUM 9.6 06/03/2019   GFRAA 50 (L) 11/25/2018    Speciality Comments: PLQ eye exam:10/22/18 Normal. [facility] Follow up in 6 months.  Procedures:  No procedures performed Allergies: Adhesive [tape], Celebrex [celecoxib], Ciprofibrate, Cymbalta [duloxetine hcl], Gabitril [tiagabine], Keflex [cephalexin], Lyrica [pregabalin], Neurontin [gabapentin], Nexium [esomeprazole], Nsaids, Penicillins, Shrimp [shellfish allergy], Sulfa antibiotics, Azactam [aztreonam], Azelastine hcl, Ciprofloxacin, Methotrexate derivatives, Nasacort [triamcinolone], Olopatadine, Zantac [ranitidine hcl], and Claritin-d 12 hour [loratadine-pseudoephedrine er]      Assessment / Plan:     Visit Diagnoses: Rheumatoid arthritis involving multiple sites with positive rheumatoid factor ([place]) - She has no synovitis on exam.  She is not had any recent rheumatoid arthritis flares.  She is clinically doing well on Plaquenil 200 mg 1 tablet by mouth daily and prednisone 7 mg by mouth daily.  She has no inflammation at this time.  She will continue on this current treatment regimen.  She is advised to notify us if she develops signs or symptoms of rheumatoid arthritis flare.  She will follow-up in the office in 5 months.  High risk medication use - She takes Plaquenil 200 mg 1 tablet by mouth daily and prednisone 7 mg po daily. (discontinued MTX in 12/2017 due to frequent infections). Last Plaquenil eye exam normal on  10/22/2018.  Most recent CBC/CMP within normal limits except for elevated WBC and decreased alk phos and GFR on 06/03/2019.  Due for CBC/CMP in November and will monitor every 5 months.  Standing orders are in place.  Primary osteoarthritis of both hands -She has PIP and DIP synovial thickening consistent with osteoarthritis of bilateral hands.  She has CMC joint synovial thickening bilaterally.  She has complete fist motion bilaterally.  No synovitis was noted on exam.  Joint protection and muscle strengthening were discussed.   Primary osteoarthritis of both knees -she is previously experiencing severe pain in her left knee joint.  She had MRI of the left knee performed on 02/22/2019 which revealed tricompartmental degenerative changes.  She had  cortisone injections in her left knee joint on 02/11/2019, 02/25/2019, and 03/18/2019.  She has been following along with Dr. Sharol Given.  She has no left knee joint pain at this time.  She continues experiencing buckling in the left knee.  She has been walking with a cane.   Primary osteoarthritis of both feet -she has osteoarthritic changes and overcrowding in her toes.  Discussed importance of wearing proper fitting shoes.  Age-related osteoporosis without current pathological fracture - last bone density from December 27, 2017 showed a T score of -1.7.  She was treated with Reclast in the past.  She is on a drug holiday currently. She is taking a vitamin D supplement.   Fibromyalgia -She has generalized muscle aches and muscle tenderness due to fibromyalgia.  DDD (degenerative disc disease), cervical - She has limited ROM on exam.  She experiences intermittent pain and stiffness.  She has no symptoms of radiculopathy at this time.   History of fatigue - Chronic and related to insomnia.   Other medical conditions are listed as follows:   Adrenal insufficiency (HCC)  History of CHF (congestive heart failure)   Orders: No orders of the defined types were placed  in this encounter.  No orders of the defined types were placed in this encounter.     Follow-Up Instructions: Return in about 5 months (around 11/10/2019) for Rheumatoid arthritis, Osteoarthritis, Fibromyalgia, Osteoporosis.   Ofilia Neas, PA-C   I examined and evaluated the patient with Hazel Sams PA.  Patient had no synovitis on my examination today.  She continues to have some discomfort from underlying osteoarthritis.  The plan of care was discussed as noted above.  Bo Merino, MD  Note - This record has been created using Editor, commissioning.  Chart creation errors have been sought, but may not always  have been located. Such creation errors do not reflect on  the standard of medical care.

## 2019-05-29 ENCOUNTER — Other Ambulatory Visit: Payer: Self-pay | Admitting: Rheumatology

## 2019-05-29 NOTE — Telephone Encounter (Signed)
Last Visit: 02/11/19 Next Visit: 06/10/19  Okay to refill per Dr. Estanislado Pandy

## 2019-06-03 ENCOUNTER — Other Ambulatory Visit (INDEPENDENT_AMBULATORY_CARE_PROVIDER_SITE_OTHER): Payer: Medicare Other

## 2019-06-03 ENCOUNTER — Other Ambulatory Visit: Payer: Self-pay

## 2019-06-03 ENCOUNTER — Ambulatory Visit: Payer: Medicare Other | Admitting: Family Medicine

## 2019-06-03 DIAGNOSIS — E538 Deficiency of other specified B group vitamins: Secondary | ICD-10-CM

## 2019-06-03 DIAGNOSIS — R609 Edema, unspecified: Secondary | ICD-10-CM

## 2019-06-03 LAB — CBC WITH DIFFERENTIAL/PLATELET
Basophils Absolute: 0 10*3/uL (ref 0.0–0.1)
Basophils Relative: 0.2 % (ref 0.0–3.0)
Eosinophils Absolute: 0 10*3/uL (ref 0.0–0.7)
Eosinophils Relative: 0.2 % (ref 0.0–5.0)
HCT: 39.1 % (ref 36.0–46.0)
Hemoglobin: 12.9 g/dL (ref 12.0–15.0)
Lymphocytes Relative: 9.9 % — ABNORMAL LOW (ref 12.0–46.0)
Lymphs Abs: 1.1 10*3/uL (ref 0.7–4.0)
MCHC: 33.1 g/dL (ref 30.0–36.0)
MCV: 100.2 fl — ABNORMAL HIGH (ref 78.0–100.0)
Monocytes Absolute: 0.5 10*3/uL (ref 0.1–1.0)
Monocytes Relative: 4.5 % (ref 3.0–12.0)
Neutro Abs: 9.7 10*3/uL — ABNORMAL HIGH (ref 1.4–7.7)
Neutrophils Relative %: 85.2 % — ABNORMAL HIGH (ref 43.0–77.0)
Platelets: 260 10*3/uL (ref 150.0–400.0)
RBC: 3.9 Mil/uL (ref 3.87–5.11)
RDW: 13.6 % (ref 11.5–15.5)
WBC: 11.4 10*3/uL — ABNORMAL HIGH (ref 4.0–10.5)

## 2019-06-03 LAB — COMPREHENSIVE METABOLIC PANEL
ALT: 21 U/L (ref 0–35)
AST: 19 U/L (ref 0–37)
Albumin: 4 g/dL (ref 3.5–5.2)
Alkaline Phosphatase: 35 U/L — ABNORMAL LOW (ref 39–117)
BUN: 33 mg/dL — ABNORMAL HIGH (ref 6–23)
CO2: 29 mEq/L (ref 19–32)
Calcium: 9.6 mg/dL (ref 8.4–10.5)
Chloride: 102 mEq/L (ref 96–112)
Creatinine, Ser: 0.98 mg/dL (ref 0.40–1.20)
GFR: 53.97 mL/min — ABNORMAL LOW (ref >60.00)
Glucose, Bld: 146 mg/dL — ABNORMAL HIGH (ref 70–99)
Potassium: 4.9 mEq/L (ref 3.5–5.1)
Sodium: 141 mEq/L (ref 135–145)
Total Bilirubin: 0.5 mg/dL (ref 0.2–1.2)
Total Protein: 6.5 g/dL (ref 6.0–8.3)

## 2019-06-03 LAB — VITAMIN B12: Vitamin B-12: 571 pg/mL (ref 211–911)

## 2019-06-03 LAB — PHOSPHORUS: Phosphorus: 2.8 mg/dL (ref 2.3–4.6)

## 2019-06-03 LAB — MAGNESIUM: Magnesium: 2 mg/dL (ref 1.5–2.5)

## 2019-06-03 MED ORDER — POTASSIUM CHLORIDE CRYS ER 10 MEQ PO TBCR: 10.0000 meq | EXTENDED_RELEASE_TABLET | Freq: Two times a day (BID) | ORAL | 3 refills | Status: DC

## 2019-06-03 MED ORDER — CYANOCOBALAMIN 1000 MCG/ML IJ SOLN: INTRAMUSCULAR | 0 refills | Status: DC

## 2019-06-04 ENCOUNTER — Ambulatory Visit (INDEPENDENT_AMBULATORY_CARE_PROVIDER_SITE_OTHER): Payer: Medicare Other | Admitting: Allergy

## 2019-06-04 ENCOUNTER — Encounter: Payer: Self-pay | Admitting: Allergy

## 2019-06-04 VITALS — Wt 166.0 lb

## 2019-06-04 DIAGNOSIS — B999 Unspecified infectious disease: Secondary | ICD-10-CM

## 2019-06-04 DIAGNOSIS — J310 Chronic rhinitis: Secondary | ICD-10-CM | POA: Diagnosis not present

## 2019-06-04 DIAGNOSIS — Z91018 Allergy to other foods: Secondary | ICD-10-CM | POA: Diagnosis not present

## 2019-06-04 NOTE — Patient Instructions (Signed)
Non-allergic rhinitis and conjunctivitis   - environmental allergy testing via serum IgE levels was negative.     - you most likely have non-allergic triggers that can cause similar symptoms as allergic triggers   - continue allegra 180mg  daily   - for nasal congestion and blockage recommend use of Over-the-counter nasal Afrin, a decongestant.  Use Afrin 2 sprays each nostril and wait 5-15 minutes or until you are breathing more freely then use your medicated nasal sprays below to help maintain patency of the nasal airway.   Do not use Afrin more than 3-5 days in a row.  If Afrin is used consistently for long periods of time it can cause worsening congestion.    - for nasal congestion continue use fluticasone (generic) 2 sprays each nostril daily as needed as this has not caused symptoms.     - for nasal drainage or post-nasal drip continue use of nasal Atrovent 0.03% 2 sprays each nostril up 3 times a day as needed     - continue singulair 10mg  at bedtime  Recurrent infections   - immunocompetence work-up was reassuring  Shrimp allergy   - continue avoidance measures for shrimp allergy   - would like for you to have access to Epipen in case of a severe allergic reaction   Follow-up 4-6 months or sooner if needed

## 2019-06-04 NOTE — Progress Notes (Signed)
RE: Eileen Wilkerson MRN: [id number] DOB: February 21, 1932 Date of Telemedicine Visit: 06/04/2019  Referring provider: Briscoe Deutscher, DO Primary care provider: Briscoe Deutscher, DO  Chief Complaint: Follow-up (allergies, worse feeling and cogestion worse)   Telemedicine Follow Up Visit via Telephone: I connected with Eileen Wilkerson for a follow up on 06/04/19 by telephone and verified that I am speaking with the correct person using two identifiers.   I discussed the limitations, risks, security and privacy concerns of performing an evaluation and management service by telephone and the availability of in person appointments. I also discussed with the patient that there may be a patient responsible charge related to this service. The patient expressed understanding and agreed to proceed.  Patient is at home. .  Provider is at the office.  Visit start time: 1638 Visit end time: [place] consent/check in by: Dorris Fetch Medical consent and medical assistant/nurse: Lenise Arena  History of Present Illness: She is a 85 y.o. female, who is being followed for non-allergic rhinitis, food allergy and recurrent infection. Her previous allergy office visit was on 02/06/2019 with Dr. Nelva Bush.   She states she has been having a lot of health issues especially of her hips, back and legs.  She states she has been having trouble walking.  She has had needed injections for her hips, knees and back.  She has an upcoming ortho appointment as she reports continued issues with walking.   In February she also reports having costochondritis and was placed on prednisone which did help.    In regards to her alleriges she states she has been having issues with nasal congestion to the point where she can not breathe out of nose and needs to mouth breathe.  She has been using both fluticasone and nasal atrovent without much relief of the congestion.  She continues to take allegra and singulair as well.   She  continues to avoid shrimp without accidental ingestion.  She denies needing any antibiotics since her last visit.    Assessment and Plan: [name] is a 85 y.o. female with: Patient Instructions  Non-allergic rhinitis and conjunctivitis   - environmental allergy testing via serum IgE levels was negative.     - you most likely have non-allergic triggers that can cause similar symptoms as allergic triggers   - continue allegra 180mg  daily   - for nasal congestion and blockage recommend use of Over-the-counter nasal Afrin, a decongestant.  Use Afrin 2 sprays each nostril and wait 5-15 minutes or until you are breathing more freely then use your medicated nasal sprays below to help maintain patency of the nasal airway.   Do not use Afrin more than 3-5 days in a row.  If Afrin is used consistently for long periods of time it can cause worsening congestion.    - for nasal congestion continue use fluticasone (generic) 2 sprays each nostril daily as needed as this has not caused symptoms.     - for nasal drainage or post-nasal drip continue use of nasal Atrovent 0.03% 2 sprays each nostril up 3 times a day as needed     - continue singulair 10mg  at bedtime  Recurrent infections   - immunocompetence work-up was reassuring  Shrimp allergy   - continue avoidance measures for shrimp allergy   - would like for you to have access to Epipen in case of a severe allergic reaction  Follow-up 4-6 months or sooner if needed  Diagnostics: None.  Medication List:  Current Outpatient  Medications  Medication Sig Dispense Refill  . acetaminophen (TYLENOL) 500 MG tablet Take 500 mg by mouth every 6 (six) hours as needed.    Marland Kitchen atorvastatin (LIPITOR) 20 MG tablet TAKE 1 TABLET BY MOUTH AT BEDTIME AS NEEDED 90 tablet 0  . Biotin (BIOTIN MAXIMUM STRENGTH) 10 MG TABS Take 10 mg by mouth daily.    . Cholecalciferol (VITAMIN D3) 5000 units CAPS Take 5,000 Units by mouth daily.    Marland Kitchen CRANBERRY PO Take 1 capsule by  mouth daily.    . cyanocobalamin (,VITAMIN B-12,) 1000 MCG/ML injection 1000 mcg injection once per month. 6 mL 0  . diclofenac sodium (VOLTAREN) 1 % GEL APPLY 3 GRAM TOPICALLY TO THE 3 LARGEST JOINTS THREE TIMES DAILY AS NEEDED  5  . EPINEPHrine 0.3 mg/0.3 mL IJ SOAJ injection INJECT 0.3MLS INTO THE MUSCLE ONCE FOR A ONE TIME DOSE  2  . fexofenadine (ALLEGRA) 180 MG tablet Take 180 mg by mouth daily.    . fluticasone (FLONASE) 50 MCG/ACT nasal spray USE 2 SPRAY(S) IN EACH NOSTRIL ONCE DAILY 16 g 3  . furosemide (LASIX) 80 MG tablet Take 1 tablet by mouth once daily 30 tablet 2  . hydroxychloroquine (PLAQUENIL) 200 MG tablet Take 1 tablet by mouth once daily 90 tablet 0  . ipratropium (ATROVENT) 0.03 % nasal spray Place 2 sprays into both nostrils 3 (three) times daily. 30 mL 3  . lansoprazole (PREVACID) 30 MG capsule Take 1 capsule (30 mg total) by mouth daily at 12 noon. 90 capsule 3  . montelukast (SINGULAIR) 10 MG tablet TAKE 1 TABLET BY MOUTH AT BEDTIME 30 tablet 3  . potassium chloride (K-DUR) 10 MEQ tablet Take 1 tablet (10 mEq total) by mouth 2 (two) times daily. 60 tablet 3  . predniSONE (DELTASONE) 1 MG tablet TAKE 2 TABLETS BY MOUTH ONCE DAILY WITH BREAKFAST 60 tablet 0  . predniSONE (DELTASONE) 5 MG tablet TAKE 1 TABLET BY MOUTH ONCE DAILY WITH BREAKFAST AND  TAPER  AS  DIRECTED 30 tablet 0  . Syringe/Needle, Disp, (SYRINGE 3CC/25GX1") 25G X 1" 3 ML MISC 1 application by Does not apply route once a week. 12 each 0  . traMADol (ULTRAM) 50 MG tablet TAKE 1 TABLET BY MOUTH 4 TIMES DAILY AS NEEDED 120 tablet 0   No current facility-administered medications for this visit.    Allergies: Allergies  Allergen Reactions  . Adhesive [Tape] Rash  . Celebrex [Celecoxib] Hives  . Ciprofibrate Nausea Only  . Cymbalta [Duloxetine Hcl] Swelling  . Gabitril [Tiagabine] Swelling  . Keflex [Cephalexin] Nausea And Vomiting  . Lyrica [Pregabalin] Swelling  . Neurontin [Gabapentin] Swelling  .  Nexium [Esomeprazole] Rash  . Nsaids Rash  . Penicillins Rash    Injection site reaction. Tolerated cefepime in past Has patient had a PCN reaction causing immediate rash, facial/tongue/throat swelling, SOB or lightheadedness with hypotension: No Has patient had a PCN reaction causing severe rash involving mucus membranes or skin necrosis: No Has patient had a PCN reaction that required hospitalization No Has patient had a PCN reaction occurring within the last 10 years: No If all of the above answers are "NO", then may proceed with Cephalosporin use.   . Shrimp [Shellfish Allergy] Anaphylaxis    Per patient "shrimp only"  . Sulfa Antibiotics Nausea And Vomiting  . Azactam [Aztreonam]     Hand swelling   . Azelastine Hcl     Rash   . Ciprofloxacin Other (See Comments)  dizziness  . Methotrexate Derivatives   . Nasacort [Triamcinolone]     Dizzy   . Olopatadine Other (See Comments)    Pain and lethargy   . Zantac [Ranitidine Hcl]   . Claritin-D 12 Hour [Loratadine-Pseudoephedrine Er] Anxiety   I reviewed her past medical history, social history, family history, and environmental history and no significant changes have been reported from previous visit on 02/06/2019.  Review of Systems  Constitutional: Negative for chills and fever.  HENT: Positive for congestion and rhinorrhea. Negative for ear pain, nosebleeds, postnasal drip, sinus pressure, sinus pain and sneezing.   Eyes: Negative.   Respiratory: Negative.   Cardiovascular: Negative.   Gastrointestinal: Negative.   Musculoskeletal: Positive for back pain and gait problem.  Skin: Negative for rash.  Neurological: Negative for headaches.   Objective: Physical Exam Not obtained as encounter was done via telephone.   Previous notes and tests were reviewed.  I discussed the assessment and treatment plan with the patient. The patient was provided an opportunity to ask questions and all were answered. The patient agreed  with the plan and demonstrated an understanding of the instructions.   The patient was advised to call back or seek an in-person evaluation if the symptoms worsen or if the condition fails to improve as anticipated.  I provided 14 minutes of non-face-to-face time during this encounter.  It was my pleasure to participate in [place] care today. Please feel free to contact me with any questions or concerns.   Sincerely,  Jeydi Klingel Charmian Muff, MD

## 2019-06-08 NOTE — Progress Notes (Signed)
Eileen Wilkerson is a 85 y.o. female is here for follow up.  History of Present Illness:   HPI:   Multiple pain complaints, has seen Orthopedics and Neurosurgery. Multiple injections. Starting PT. Taking Tramadol 3-4 times per day. Followed by Rheumatology. Taking 7 mg prednisone daily and Plaquenil for RA.   Sinus issues. Congestion, with no fever, ear pain, PND. Saw Allergist. Rx Afrin which helped but did not last. Per patient, Allergist recommended prednisone burst.   Dermatology visit a month ago, with area on right inner lower leg cryotherapy. Still with crusted area.   Edema. Bilateral LE edema, 1+ pitting. Elevating legs. Takes Lasix daily.   Health Maintenance Due  Topic Date Due  . PNA vac Low Risk Adult (2 of 2 - PPSV23) 06/26/2018   Depression screen [facility] 2/9 06/09/2019 12/30/2018 07/09/2018  Decreased Interest 0 0 0  Down, Depressed, Hopeless 0 0 0  PHQ - 2 Score 0 0 0  Altered sleeping 0 0 -  Tired, decreased energy 3 3 -  Change in appetite 0 1 -  Feeling bad or failure about yourself  0 0 -  Trouble concentrating 0 0 -  Moving slowly or fidgety/restless 0 0 -  Suicidal thoughts 0 0 -  PHQ-9 Score 3 4 -  Difficult doing work/chores Not difficult at all Not difficult at all -  Some recent data might be hidden   PMHx, SurgHx, SocialHx, FamHx, Medications, and Allergies were reviewed in the Visit Navigator and updated as appropriate.   Patient Active Problem List   Diagnosis Date Noted  . B12 deficiency 03/11/2018  . Idiopathic chronic venous hypertension of both lower extremities with ulcer and inflammation ([place]) 09/18/2017  . Bilateral lower extremity edema 06/30/2017  . Physical deconditioning 05/30/2017  . Immunosuppressed status ([place]) 05/30/2017  . Abnormal chest x-ray 05/17/2017  . Fibromyalgia 12/18/2016  . DJD (degenerative joint disease), cervical 12/18/2016  . Spondylosis of lumbar region without myelopathy or radiculopathy 12/18/2016  . Trigger  finger, right middle finger 12/18/2016  . Primary osteoarthritis of both feet 12/18/2016  . Primary osteoarthritis of both knees 12/18/2016  . GERD (gastroesophageal reflux disease) 12/18/2016  . Age-related osteoporosis without current pathological fracture 12/18/2016  . Rheumatoid arthritis ([place]) 12/28/2015  . Long term current use of systemic steroids, for RA 12/28/2015  . Lymphocytosis 11/13/2012   Social History   Tobacco Use  . Smoking status: Former Smoker    Packs/day: 0.75    Years: 10.00    Pack years: 7.50    Types: Cigarettes    Quit date: 1968    Years since quitting: 52.5  . Smokeless tobacco: Never Used  Substance Use Topics  . Alcohol use: No  . Drug use: No   Current Medications and Allergies   Current Outpatient Medications:  .  acetaminophen (TYLENOL) 500 MG tablet, Take 500 mg by mouth every 6 (six) hours. , Disp: , Rfl:  .  atorvastatin (LIPITOR) 20 MG tablet, TAKE 1 TABLET BY MOUTH AT BEDTIME AS NEEDED (Patient taking differently: Take 20 mg by mouth daily. ), Disp: 90 tablet, Rfl: 0 .  Biotin (BIOTIN MAXIMUM STRENGTH) 10 MG TABS, Take 10 mg by mouth daily., Disp: , Rfl:  .  Cholecalciferol (VITAMIN D3) 5000 units CAPS, Take 5,000 Units by mouth daily., Disp: , Rfl:  .  CRANBERRY PO, Take 1 capsule by mouth daily., Disp: , Rfl:  .  cyanocobalamin (,VITAMIN B-12,) 1000 MCG/ML injection, 1000 mcg injection once per month.,  Disp: 6 mL, Rfl: 0 .  diclofenac sodium (VOLTAREN) 1 % GEL, APPLY 3 GRAM TOPICALLY TO THE 3 LARGEST JOINTS THREE TIMES DAILY AS NEEDED, Disp: , Rfl: 5 .  EPINEPHrine 0.3 mg/0.3 mL IJ SOAJ injection, INJECT 0.3MLS INTO THE MUSCLE ONCE FOR A ONE TIME DOSE, Disp: , Rfl: 2 .  fexofenadine (ALLEGRA) 180 MG tablet, Take 180 mg by mouth daily., Disp: , Rfl:  .  fluticasone (FLONASE) 50 MCG/ACT nasal spray, USE 2 SPRAY(S) IN EACH NOSTRIL ONCE DAILY, Disp: 16 g, Rfl: 3 .  furosemide (LASIX) 80 MG tablet, Take 1 tablet by mouth once daily (Patient  taking differently: Take 80 mg by mouth daily. Pt take 1/2 a day), Disp: 30 tablet, Rfl: 2 .  hydroxychloroquine (PLAQUENIL) 200 MG tablet, Take 1 tablet by mouth once daily, Disp: 90 tablet, Rfl: 0 .  ipratropium (ATROVENT) 0.03 % nasal spray, Place 2 sprays into both nostrils 3 (three) times daily., Disp: 30 mL, Rfl: 3 .  lansoprazole (PREVACID) 30 MG capsule, Take 1 capsule (30 mg total) by mouth daily at 12 noon., Disp: 90 capsule, Rfl: 3 .  montelukast (SINGULAIR) 10 MG tablet, TAKE 1 TABLET BY MOUTH AT BEDTIME, Disp: 30 tablet, Rfl: 3 .  potassium chloride (K-DUR) 10 MEQ tablet, Take 1 tablet (10 mEq total) by mouth 2 (two) times daily., Disp: 60 tablet, Rfl: 3 .  predniSONE (DELTASONE) 1 MG tablet, TAKE 2 TABLETS BY MOUTH ONCE DAILY WITH BREAKFAST, Disp: 60 tablet, Rfl: 0 .  predniSONE (DELTASONE) 5 MG tablet, TAKE 1 TABLET BY MOUTH ONCE DAILY WITH BREAKFAST AND  TAPER  AS  DIRECTED, Disp: 30 tablet, Rfl: 0 .  Syringe/Needle, Disp, (SYRINGE 3CC/25GX1") 25G X 1" 3 ML MISC, 1 application by Does not apply route once a week., Disp: 12 each, Rfl: 0 .  traMADol (ULTRAM) 50 MG tablet, TAKE 1 TABLET BY MOUTH 4 TIMES DAILY AS NEEDED, Disp: 120 tablet, Rfl: 0   Allergies  Allergen Reactions  . Adhesive [Tape] Rash  . Celebrex [Celecoxib] Hives  . Ciprofibrate Nausea Only  . Cymbalta [Duloxetine Hcl] Swelling  . Gabitril [Tiagabine] Swelling  . Keflex [Cephalexin] Nausea And Vomiting  . Lyrica [Pregabalin] Swelling  . Neurontin [Gabapentin] Swelling  . Nexium [Esomeprazole] Rash  . Nsaids Rash  . Penicillins Rash    Injection site reaction. Tolerated cefepime in past Has patient had a PCN reaction causing immediate rash, facial/tongue/throat swelling, SOB or lightheadedness with hypotension: No Has patient had a PCN reaction causing severe rash involving mucus membranes or skin necrosis: No Has patient had a PCN reaction that required hospitalization No Has patient had a PCN reaction  occurring within the last 10 years: No If all of the above answers are "NO", then may proceed with Cephalosporin use.   . Shrimp [Shellfish Allergy] Anaphylaxis    Per patient "shrimp only"  . Sulfa Antibiotics Nausea And Vomiting  . Azactam [Aztreonam]     Hand swelling   . Azelastine Hcl     Rash   . Ciprofloxacin Other (See Comments)    dizziness  . Methotrexate Derivatives   . Nasacort [Triamcinolone]     Dizzy   . Olopatadine Other (See Comments)    Pain and lethargy   . Zantac [Ranitidine Hcl]   . Claritin-D 12 Hour [Loratadine-Pseudoephedrine Er] Anxiety   Review of Systems   Pertinent items are noted in the HPI. Otherwise, a complete ROS is negative.  Vitals   Vitals:   06/09/19  1520  BP: 130/82  Pulse: 96  Temp: 98.6 F (37 C)  TempSrc: Oral  SpO2: 97%  Weight: 168 lb 8 oz (76.4 kg)  Height: 5\' 4"  (1.626 m)     Body mass index is 28.92 kg/m.  Physical Exam   Physical Exam Vitals signs and nursing note reviewed.  Constitutional:      Comments: Cane.  HENT:     Head: Normocephalic and atraumatic.  Eyes:     Pupils: Pupils are equal, round, and reactive to light.  Neck:     Musculoskeletal: Normal range of motion and neck supple.  Cardiovascular:     Rate and Rhythm: Normal rate and regular rhythm.     Heart sounds: Normal heart sounds.  Pulmonary:     Effort: Pulmonary effort is normal.  Abdominal:     Palpations: Abdomen is soft.  Skin:    General: Skin is warm.     Comments: Healing wound right leg, with crust.  Psychiatric:        Behavior: Behavior normal.     Results for orders placed or performed in visit on 06/03/19  Phosphorus  Result Value Ref Range   Phosphorus 2.8 2.3 - 4.6 mg/dL  Magnesium  Result Value Ref Range   Magnesium 2.0 1.5 - 2.5 mg/dL  Vitamin B12  Result Value Ref Range   Vitamin B-12 571 211 - 911 pg/mL  Comprehensive metabolic panel  Result Value Ref Range   Sodium 141 135 - 145 mEq/L   Potassium 4.9 3.5 -  5.1 mEq/L   Chloride 102 96 - 112 mEq/L   CO2 29 19 - 32 mEq/L   Glucose, Bld 146 (H) 70 - 99 mg/dL   BUN 33 (H) 6 - 23 mg/dL   Creatinine, Ser 0.98 0.40 - 1.20 mg/dL   Total Bilirubin 0.5 0.2 - 1.2 mg/dL   Alkaline Phosphatase 35 (L) 39 - 117 U/L   AST 19 0 - 37 U/L   ALT 21 0 - 35 U/L   Total Protein 6.5 6.0 - 8.3 g/dL   Albumin 4.0 3.5 - 5.2 g/dL   Calcium 9.6 8.4 - 10.5 mg/dL   GFR 53.97 (L) >60.00 mL/min  CBC with Differential/Platelet  Result Value Ref Range   WBC 11.4 (H) 4.0 - 10.5 K/uL   RBC 3.90 3.87 - 5.11 Mil/uL   Hemoglobin 12.9 12.0 - 15.0 g/dL   HCT 39.1 36.0 - 46.0 %   MCV 100.2 (H) 78.0 - 100.0 fl   MCHC 33.1 30.0 - 36.0 g/dL   RDW 13.6 11.5 - 15.5 %   Platelets 260.0 150.0 - 400.0 K/uL   Neutrophils Relative % 85.2 Repeated and verified X2. (H) 43.0 - 77.0 %   Lymphocytes Relative 9.9 (L) 12.0 - 46.0 %   Monocytes Relative 4.5 3.0 - 12.0 %   Eosinophils Relative 0.2 0.0 - 5.0 %   Basophils Relative 0.2 0.0 - 3.0 %   Neutro Abs 9.7 (H) 1.4 - 7.7 K/uL   Lymphs Abs 1.1 0.7 - 4.0 K/uL   Monocytes Absolute 0.5 0.1 - 1.0 K/uL   Eosinophils Absolute 0.0 0.0 - 0.7 K/uL   Basophils Absolute 0.0 0.0 - 0.1 K/uL   Dg Chest 2 View  Result Date: 06/09/2019 CLINICAL DATA:  Cough for 1 week EXAM: CHEST - 2 VIEW COMPARISON:  01/24/2011 FINDINGS: Heart is normal size. Linear densities at the left base are unchanged, likely scarring or chronic atelectasis. No acute confluent airspace opacities or  effusions. No acute bony abnormality. IMPRESSION: Left base scarring or atelectasis.  No change since prior study. Electronically Signed   By: Rolm Baptise M.D.   On: 06/09/2019 20:52   Assessment and Plan   Eileen Wilkerson was seen today for follow-up.  Diagnoses and all orders for this visit:  Cough -     DG Chest 2 View; Future  Fibromyalgia -     traMADol (ULTRAM) 50 MG tablet; TAKE 1 TABLET BY MOUTH 4 TIMES DAILY AS NEEDED  Rheumatoid arthritis involving multiple sites with  positive rheumatoid factor (HCC) -     traMADol (ULTRAM) 50 MG tablet; TAKE 1 TABLET BY MOUTH 4 TIMES DAILY AS NEEDED  Bilateral lower extremity edema  Physical deconditioning  Immunosuppressed status (HCC)  Long term current use of systemic steroids, for RA  Costochondritis  B12 deficiency   . Orders and follow up as documented in [place], reviewed diet, exercise and weight control, cardiovascular risk and specific lipid/LDL goals reviewed, reviewed medications and side effects in detail.  . Reviewed expectations re: course of current medical issues. . Outlined signs and symptoms indicating need for more acute intervention. . Patient verbalized understanding and all questions were answered. . Patient received an After Visit Summary.  Briscoe Deutscher, DO [place] 06/10/2019

## 2019-06-09 ENCOUNTER — Ambulatory Visit (INDEPENDENT_AMBULATORY_CARE_PROVIDER_SITE_OTHER): Payer: Medicare Other

## 2019-06-09 ENCOUNTER — Other Ambulatory Visit: Payer: Self-pay

## 2019-06-09 ENCOUNTER — Encounter: Payer: Self-pay | Admitting: Family Medicine

## 2019-06-09 ENCOUNTER — Ambulatory Visit: Payer: Medicare Other | Admitting: Family Medicine

## 2019-06-09 VITALS — BP 130/82 | HR 96 | Temp 98.6°F | Ht 64.0 in | Wt 168.5 lb

## 2019-06-09 DIAGNOSIS — M0579 Rheumatoid arthritis with rheumatoid factor of multiple sites without organ or systems involvement: Secondary | ICD-10-CM

## 2019-06-09 DIAGNOSIS — R059 Cough, unspecified: Secondary | ICD-10-CM

## 2019-06-09 DIAGNOSIS — R600 Localized edema: Secondary | ICD-10-CM | POA: Diagnosis not present

## 2019-06-09 DIAGNOSIS — R5381 Other malaise: Secondary | ICD-10-CM

## 2019-06-09 DIAGNOSIS — R05 Cough: Secondary | ICD-10-CM | POA: Diagnosis not present

## 2019-06-09 DIAGNOSIS — E538 Deficiency of other specified B group vitamins: Secondary | ICD-10-CM

## 2019-06-09 DIAGNOSIS — M940 Chondrocostal junction syndrome [Tietze]: Secondary | ICD-10-CM

## 2019-06-09 DIAGNOSIS — M797 Fibromyalgia: Secondary | ICD-10-CM

## 2019-06-09 DIAGNOSIS — D899 Disorder involving the immune mechanism, unspecified: Secondary | ICD-10-CM

## 2019-06-09 DIAGNOSIS — Z7952 Long term (current) use of systemic steroids: Secondary | ICD-10-CM

## 2019-06-09 DIAGNOSIS — D849 Immunodeficiency, unspecified: Secondary | ICD-10-CM

## 2019-06-09 MED ORDER — TRAMADOL HCL 50 MG PO TABS: ORAL_TABLET | ORAL | 0 refills | Status: DC

## 2019-06-10 ENCOUNTER — Encounter: Payer: Self-pay | Admitting: Rheumatology

## 2019-06-10 ENCOUNTER — Encounter: Payer: Self-pay | Admitting: Family Medicine

## 2019-06-10 ENCOUNTER — Ambulatory Visit: Payer: Medicare Other | Admitting: Rheumatology

## 2019-06-10 VITALS — BP 146/89 | HR 83 | Resp 13 | Ht 64.0 in | Wt 170.0 lb

## 2019-06-10 DIAGNOSIS — M797 Fibromyalgia: Secondary | ICD-10-CM

## 2019-06-10 DIAGNOSIS — M0579 Rheumatoid arthritis with rheumatoid factor of multiple sites without organ or systems involvement: Secondary | ICD-10-CM

## 2019-06-10 DIAGNOSIS — M170 Bilateral primary osteoarthritis of knee: Secondary | ICD-10-CM | POA: Diagnosis not present

## 2019-06-10 DIAGNOSIS — M810 Age-related osteoporosis without current pathological fracture: Secondary | ICD-10-CM

## 2019-06-10 DIAGNOSIS — M5030 Other cervical disc degeneration, unspecified cervical region: Secondary | ICD-10-CM

## 2019-06-10 DIAGNOSIS — Z87898 Personal history of other specified conditions: Secondary | ICD-10-CM

## 2019-06-10 DIAGNOSIS — Z79899 Other long term (current) drug therapy: Secondary | ICD-10-CM

## 2019-06-10 DIAGNOSIS — Z8679 Personal history of other diseases of the circulatory system: Secondary | ICD-10-CM

## 2019-06-10 DIAGNOSIS — E2740 Unspecified adrenocortical insufficiency: Secondary | ICD-10-CM

## 2019-06-10 DIAGNOSIS — M19041 Primary osteoarthritis, right hand: Secondary | ICD-10-CM

## 2019-06-10 DIAGNOSIS — M19042 Primary osteoarthritis, left hand: Secondary | ICD-10-CM

## 2019-06-10 DIAGNOSIS — M19072 Primary osteoarthritis, left ankle and foot: Secondary | ICD-10-CM

## 2019-06-10 DIAGNOSIS — M19071 Primary osteoarthritis, right ankle and foot: Secondary | ICD-10-CM

## 2019-06-11 NOTE — Progress Notes (Signed)
Called patient she is doing ok but she wanted to know if the chest xray results could be the reason for her feeling tired all the time. She wants to let the prednisone go for now unless you think that is what is wrong with her lungs.

## 2019-06-16 ENCOUNTER — Encounter: Payer: Self-pay | Admitting: Family Medicine

## 2019-06-16 NOTE — Telephone Encounter (Signed)
Not sure if pt meant to send this to you or Dr.Wallace. Haven't seen any recent visits with you. Please let me know if I need to send to [facility] for Dr. Juleen China review.

## 2019-06-19 MED ORDER — PREDNISONE 10 MG PO TABS: 20.0000 mg | ORAL_TABLET | Freq: Every day | ORAL | 0 refills | Status: AC

## 2019-06-23 NOTE — Progress Notes (Signed)
Informed patient in phone note

## 2019-06-25 ENCOUNTER — Other Ambulatory Visit: Payer: Self-pay | Admitting: Family Medicine

## 2019-06-25 ENCOUNTER — Other Ambulatory Visit: Payer: Self-pay | Admitting: Rheumatology

## 2019-06-25 DIAGNOSIS — H1013 Acute atopic conjunctivitis, bilateral: Secondary | ICD-10-CM

## 2019-06-25 DIAGNOSIS — J309 Allergic rhinitis, unspecified: Secondary | ICD-10-CM

## 2019-06-25 NOTE — Telephone Encounter (Signed)
Last Visit: 06/10/19 Next Visit: 11/12/19  Okay to refill per Dr. Estanislado Pandy

## 2019-07-16 ENCOUNTER — Other Ambulatory Visit: Payer: Self-pay | Admitting: Rheumatology

## 2019-07-16 DIAGNOSIS — M0579 Rheumatoid arthritis with rheumatoid factor of multiple sites without organ or systems involvement: Secondary | ICD-10-CM

## 2019-07-16 NOTE — Telephone Encounter (Signed)
Last Visit: 06/10/19 Next Visit: 11/12/19  Okay to refill per Dr. Estanislado Pandy

## 2019-07-22 ENCOUNTER — Ambulatory Visit: Payer: Medicare Other

## 2019-07-24 ENCOUNTER — Other Ambulatory Visit: Payer: Self-pay | Admitting: Family Medicine

## 2019-07-24 ENCOUNTER — Other Ambulatory Visit: Payer: Self-pay | Admitting: Rheumatology

## 2019-07-24 DIAGNOSIS — H1013 Acute atopic conjunctivitis, bilateral: Secondary | ICD-10-CM

## 2019-07-24 DIAGNOSIS — M0579 Rheumatoid arthritis with rheumatoid factor of multiple sites without organ or systems involvement: Secondary | ICD-10-CM

## 2019-07-24 DIAGNOSIS — R600 Localized edema: Secondary | ICD-10-CM

## 2019-07-24 DIAGNOSIS — M797 Fibromyalgia: Secondary | ICD-10-CM

## 2019-07-24 DIAGNOSIS — J309 Allergic rhinitis, unspecified: Secondary | ICD-10-CM

## 2019-07-24 NOTE — Telephone Encounter (Signed)
Last Visit: 06/10/19 Next Visit: 11/12/19 Labs: 06/03/19 BUN 33 Glucose 33 Alk. Phos 35 GFR 53.97 WBC 11.4 MVC 100.2 Neutrophils Relative 85.2 Lymphocytes Relative 9.9 Neutro As 9.7 PLQ eye exam:10/22/18 Normal.   Okay to refill per Dr. Estanislado Pandy

## 2019-07-28 NOTE — Telephone Encounter (Signed)
Last fill 06/26/19  #30/0 Last OV 06/09/19 Next OV 08/06/19

## 2019-07-30 ENCOUNTER — Other Ambulatory Visit: Payer: Self-pay

## 2019-07-30 DIAGNOSIS — H1013 Acute atopic conjunctivitis, bilateral: Secondary | ICD-10-CM

## 2019-07-30 DIAGNOSIS — J309 Allergic rhinitis, unspecified: Secondary | ICD-10-CM

## 2019-07-30 MED ORDER — MONTELUKAST SODIUM 10 MG PO TABS: 10.0000 mg | ORAL_TABLET | Freq: Every day | ORAL | 3 refills | Status: DC

## 2019-07-30 MED ORDER — ATORVASTATIN CALCIUM 20 MG PO TABS: 20.0000 mg | ORAL_TABLET | Freq: Every day | ORAL | 3 refills | Status: DC

## 2019-07-31 ENCOUNTER — Other Ambulatory Visit: Payer: Self-pay | Admitting: Rheumatology

## 2019-07-31 ENCOUNTER — Encounter: Payer: Self-pay | Admitting: Family Medicine

## 2019-07-31 ENCOUNTER — Other Ambulatory Visit: Payer: Self-pay | Admitting: Family Medicine

## 2019-07-31 DIAGNOSIS — M0579 Rheumatoid arthritis with rheumatoid factor of multiple sites without organ or systems involvement: Secondary | ICD-10-CM

## 2019-07-31 DIAGNOSIS — M797 Fibromyalgia: Secondary | ICD-10-CM

## 2019-07-31 NOTE — Telephone Encounter (Signed)
Last Visit: 06/10/19 Next Visit: 11/12/19  Okay to refill per Dr. Estanislado Pandy

## 2019-07-31 NOTE — Telephone Encounter (Signed)
Last OV: 06/09/19 Next OV: 08/06/19 PMP website checked: 06/25/19

## 2019-08-06 ENCOUNTER — Encounter: Payer: Self-pay | Admitting: Family Medicine

## 2019-08-06 ENCOUNTER — Ambulatory Visit (INDEPENDENT_AMBULATORY_CARE_PROVIDER_SITE_OTHER): Payer: Medicare Other | Admitting: Family Medicine

## 2019-08-06 ENCOUNTER — Other Ambulatory Visit: Payer: Self-pay

## 2019-08-06 VITALS — Temp 96.0°F | Ht 64.0 in | Wt 172.0 lb

## 2019-08-06 DIAGNOSIS — M0579 Rheumatoid arthritis with rheumatoid factor of multiple sites without organ or systems involvement: Secondary | ICD-10-CM | POA: Diagnosis not present

## 2019-08-06 DIAGNOSIS — M47816 Spondylosis without myelopathy or radiculopathy, lumbar region: Secondary | ICD-10-CM

## 2019-08-06 DIAGNOSIS — R0602 Shortness of breath: Secondary | ICD-10-CM

## 2019-08-06 DIAGNOSIS — R600 Localized edema: Secondary | ICD-10-CM

## 2019-08-06 DIAGNOSIS — M797 Fibromyalgia: Secondary | ICD-10-CM | POA: Diagnosis not present

## 2019-08-06 DIAGNOSIS — E7800 Pure hypercholesterolemia, unspecified: Secondary | ICD-10-CM

## 2019-08-06 DIAGNOSIS — L659 Nonscarring hair loss, unspecified: Secondary | ICD-10-CM

## 2019-08-06 DIAGNOSIS — Z7952 Long term (current) use of systemic steroids: Secondary | ICD-10-CM

## 2019-08-06 MED ORDER — POTASSIUM CHLORIDE CRYS ER 10 MEQ PO TBCR: 20.0000 meq | EXTENDED_RELEASE_TABLET | Freq: Two times a day (BID) | ORAL | 1 refills | Status: DC

## 2019-08-06 MED ORDER — PREDNISONE 10 MG PO TABS: 10.0000 mg | ORAL_TABLET | Freq: Every day | ORAL | 1 refills | Status: DC

## 2019-08-06 MED ORDER — FUROSEMIDE 80 MG PO TABS: 80.0000 mg | ORAL_TABLET | Freq: Every day | ORAL | 1 refills | Status: DC

## 2019-08-06 MED ORDER — ROGAINE 2 % EX SOLN: Freq: Two times a day (BID) | CUTANEOUS | 0 refills | Status: DC

## 2019-08-06 MED ORDER — TRAMADOL HCL 50 MG PO TABS: 50.0000 mg | ORAL_TABLET | Freq: Four times a day (QID) | ORAL | 2 refills | Status: DC

## 2019-08-06 MED ORDER — ATORVASTATIN CALCIUM 20 MG PO TABS: 20.0000 mg | ORAL_TABLET | Freq: Every day | ORAL | 3 refills | Status: DC

## 2019-08-06 NOTE — Progress Notes (Signed)
Virtual Visit via Video   Due to the COVID-19 pandemic, this visit was completed with telemedicine (audio/video) technology to reduce patient and provider exposure as well as to preserve personal protective equipment.   I connected with Avanna T Viruet by a video enabled telemedicine application and verified that I am speaking with the correct person using two identifiers. Location patient: Home Location provider: [facility], Office Persons participating in the virtual visit: Andriana T Shone, Briscoe Deutscher, DO   I discussed the limitations of evaluation and management by telemedicine and the availability of in person appointments. The patient expressed understanding and agreed to proceed.  Care Team   Patient Care Team: Briscoe Deutscher, DO as PCP - General (Family Medicine)  Subjective:   HPI:   1. Bilateral lower extremity edema Worsened. Pitting. Previously taking Lasix 80 mg daily. Now, taking 40 mg po daily. With some SOB on exertion. Last ECHO 2017 reviewed with patient.   2. Fibromyalgia 3. Rheumatoid arthritis involving multiple sites with positive rheumatoid factor (HCC) 4. Spondylosis of lumbar region without myelopathy or radiculopathy Patient has been seen by Pain Management. [facility]. Was told that there is nothing else that they can do for her after multiple joint injections. Tramadol helps control the pain. Currently taking Prednisone 7 mg po daily.   5. Long term current use of systemic steroids, for RA Stable at 7 mg po daily.   6. Hair loss Interested in [place]. A friend recommended it.   Patient Active Problem List   Diagnosis Date Noted  . Hair loss 08/06/2019  . SOB (shortness of breath) on exertion 08/06/2019  . Pure hypercholesterolemia 08/06/2019  . B12 deficiency 03/11/2018  . Idiopathic chronic venous hypertension of both lower extremities with ulcer and inflammation ([place]) 09/18/2017  . Bilateral lower extremity edema  06/30/2017  . Physical deconditioning 05/30/2017  . Immunosuppressed status ([place]) 05/30/2017  . Abnormal chest x-ray 05/17/2017  . Fibromyalgia 12/18/2016  . DJD (degenerative joint disease), cervical 12/18/2016  . Spondylosis of lumbar region without myelopathy or radiculopathy 12/18/2016  . Trigger finger, right middle finger 12/18/2016  . Primary osteoarthritis of both feet 12/18/2016  . Primary osteoarthritis of both knees 12/18/2016  . GERD (gastroesophageal reflux disease) 12/18/2016  . Age-related osteoporosis without current pathological fracture 12/18/2016  . Rheumatoid arthritis ([place]) 12/28/2015  . Long term current use of systemic steroids, for RA 12/28/2015  . Lymphocytosis 11/13/2012    Social History   Tobacco Use  . Smoking status: Former Smoker    Packs/day: 0.75    Years: 10.00    Pack years: 7.50    Types: Cigarettes    Quit date: 1968    Years since quitting: 52.6  . Smokeless tobacco: Never Used  Substance Use Topics  . Alcohol use: No   Current Outpatient Medications:  .  acetaminophen (TYLENOL) 500 MG tablet, Take 500 mg by mouth every 6 (six) hours. , Disp: , Rfl:  .  atorvastatin (LIPITOR) 20 MG tablet, Take 1 tablet (20 mg total) by mouth daily., Disp: 90 tablet, Rfl: 3 .  Biotin (BIOTIN MAXIMUM STRENGTH) 10 MG TABS, Take 10 mg by mouth daily., Disp: , Rfl:  .  Cholecalciferol (VITAMIN D3) 5000 units CAPS, Take 5,000 Units by mouth daily., Disp: , Rfl:  .  CRANBERRY PO, Take 1 capsule by mouth daily., Disp: , Rfl:  .  cyanocobalamin (,VITAMIN B-12,) 1000 MCG/ML injection, 1000 mcg injection once per month., Disp: 6 mL, Rfl: 0 .  diclofenac  sodium (VOLTAREN) 1 % GEL, Apply 2-4 g to affected joints up to 4 times daily, Disp: 400 g, Rfl: 0 .  fexofenadine (ALLEGRA) 180 MG tablet, Take 180 mg by mouth daily., Disp: , Rfl:  .  fluticasone (FLONASE) 50 MCG/ACT nasal spray, USE 2 SPRAY(S) IN EACH NOSTRIL ONCE DAILY, Disp: 16 g, Rfl: 3 .  furosemide (LASIX) 80  MG tablet, Take 1 tablet (80 mg total) by mouth daily., Disp: 90 tablet, Rfl: 1 .  hydroxychloroquine (PLAQUENIL) 200 MG tablet, Take 1 tablet by mouth once daily, Disp: 90 tablet, Rfl: 0 .  ipratropium (ATROVENT) 0.03 % nasal spray, Place 2 sprays into both nostrils 3 (three) times daily., Disp: 30 mL, Rfl: 3 .  lansoprazole (PREVACID) 30 MG capsule, Take 1 capsule (30 mg total) by mouth daily at 12 noon., Disp: 90 capsule, Rfl: 3 .  montelukast (SINGULAIR) 10 MG tablet, Take 1 tablet (10 mg total) by mouth at bedtime., Disp: 30 tablet, Rfl: 3 .  potassium chloride (K-DUR) 10 MEQ tablet, Take 2 tablets (20 mEq total) by mouth 2 (two) times daily., Disp: 360 tablet, Rfl: 1 .  predniSONE (DELTASONE) 1 MG tablet, TAKE 2 TABLETS BY MOUTH ONCE DAILY WITH BREAKFAST, Disp: 60 tablet, Rfl: 0 .  predniSONE (DELTASONE) 5 MG tablet, TAKE 1 TABLET BY MOUTH ONCE DAILY WITH BREAKFAST AND  TAPER  AS  DIRECTED, Disp: 30 tablet, Rfl: 0 .  traMADol (ULTRAM) 50 MG tablet, Take 1 tablet (50 mg total) by mouth 4 (four) times daily., Disp: 120 tablet, Rfl: 2  Allergies  Allergen Reactions  . Adhesive [Tape] Rash  . Celebrex [Celecoxib] Hives  . Ciprofibrate Nausea Only  . Cymbalta [Duloxetine Hcl] Swelling  . Gabitril [Tiagabine] Swelling  . Keflex [Cephalexin] Nausea And Vomiting  . Lyrica [Pregabalin] Swelling  . Neurontin [Gabapentin] Swelling  . Nexium [Esomeprazole] Rash  . Nsaids Rash  . Penicillins Rash    Injection site reaction. Tolerated cefepime in past Has patient had a PCN reaction causing immediate rash, facial/tongue/throat swelling, SOB or lightheadedness with hypotension: No Has patient had a PCN reaction causing severe rash involving mucus membranes or skin necrosis: No Has patient had a PCN reaction that required hospitalization No Has patient had a PCN reaction occurring within the last 10 years: No If all of the above answers are "NO", then may proceed with Cephalosporin use.   . Shrimp  [Shellfish Allergy] Anaphylaxis    Per patient "shrimp only"  . Sulfa Antibiotics Nausea And Vomiting  . Azactam [Aztreonam]     Hand swelling   . Azelastine Hcl     Rash   . Ciprofloxacin Other (See Comments)    dizziness  . Methotrexate Derivatives   . Nasacort [Triamcinolone]     Dizzy   . Olopatadine Other (See Comments)    Pain and lethargy   . Zantac [Ranitidine Hcl]   . Claritin-D 12 Hour [Loratadine-Pseudoephedrine Er] Anxiety    Objective:   VITALS: Per patient if applicable, see vitals. GENERAL: Alert and in no acute distress. CARDIOPULMONARY: No increased WOB. Speaking in clear sentences.  PSYCH: Pleasant and cooperative. Speech normal rate and rhythm. Affect is appropriate. Insight and judgement are appropriate. Attention is focused, linear, and appropriate.  NEURO: Oriented as arrived to appointment on time with no prompting.   Depression screen [facility] 2/9 06/09/2019 12/30/2018 07/09/2018  Decreased Interest 0 0 0  Down, Depressed, Hopeless 0 0 0  PHQ - 2 Score 0 0 0  Altered sleeping  0 0 -  Tired, decreased energy 3 3 -  Change in appetite 0 1 -  Feeling bad or failure about yourself  0 0 -  Trouble concentrating 0 0 -  Moving slowly or fidgety/restless 0 0 -  Suicidal thoughts 0 0 -  PHQ-9 Score 3 4 -  Difficult doing work/chores Not difficult at all Not difficult at all -  Some recent data might be hidden   Assessment and Plan:   Markie was seen today for follow-up.  Diagnoses and all orders for this visit:  Bilateral lower extremity edema Comments: Okay to increase to 80 mg daily. Double KCl as well. Monitor for dry weight. Follow up with labs. Orders: -     furosemide (LASIX) 80 MG tablet; Take 1 tablet (80 mg total) by mouth daily. -     potassium chloride (K-DUR) 10 MEQ tablet; Take 2 tablets (20 mEq total) by mouth 2 (two) times daily.  Fibromyalgia -     traMADol (ULTRAM) 50 MG tablet; Take 1 tablet (50 mg total) by mouth 4 (four) times  daily.  Rheumatoid arthritis involving multiple sites with positive rheumatoid factor (HCC) -     predniSONE (DELTASONE) 10 MG tablet; Take 1 tablet (10 mg total) by mouth daily with breakfast. -     traMADol (ULTRAM) 50 MG tablet; Take 1 tablet (50 mg total) by mouth 4 (four) times daily.  SOB (shortness of breath) on exertion  Long term current use of systemic steroids, for RA -     predniSONE (DELTASONE) 10 MG tablet; Take 1 tablet (10 mg total) by mouth daily with breakfast.  Spondylosis of lumbar region without myelopathy or radiculopathy -     predniSONE (DELTASONE) 10 MG tablet; Take 1 tablet (10 mg total) by mouth daily with breakfast. -     traMADol (ULTRAM) 50 MG tablet; Take 1 tablet (50 mg total) by mouth 4 (four) times daily.  Hair loss -     minoxidil (ROGAINE) 2 % external solution; Apply topically 2 (two) times daily.  Pure hypercholesterolemia -     atorvastatin (LIPITOR) 20 MG tablet; Take 1 tablet (20 mg total) by mouth daily.   Marland Kitchen COVID-19 Education: The signs and symptoms of COVID-19 were discussed with the patient and how to seek care for testing if needed. The importance of social distancing was discussed today. . Reviewed expectations re: course of current medical issues. . Discussed self-management of symptoms. . Outlined signs and symptoms indicating need for more acute intervention. . Patient verbalized understanding and all questions were answered. Marland Kitchen Health Maintenance issues including appropriate healthy diet, exercise, and smoking avoidance were discussed with patient. . See orders for this visit as documented in the electronic medical record.  Briscoe Deutscher, DO  Records requested if needed. Time spent: 25 minutes, of which >50% was spent in obtaining information about her symptoms, reviewing her previous labs, evaluations, and treatments, counseling her about her condition (please see the discussed topics above), and developing a plan to further  investigate it; she had a number of questions which I addressed.

## 2019-08-18 ENCOUNTER — Encounter: Payer: Self-pay | Admitting: Family Medicine

## 2019-08-18 DIAGNOSIS — R600 Localized edema: Secondary | ICD-10-CM

## 2019-08-20 ENCOUNTER — Other Ambulatory Visit: Payer: Self-pay | Admitting: *Deleted

## 2019-08-20 DIAGNOSIS — L659 Nonscarring hair loss, unspecified: Secondary | ICD-10-CM

## 2019-08-20 DIAGNOSIS — R609 Edema, unspecified: Secondary | ICD-10-CM

## 2019-08-20 MED ORDER — CYANOCOBALAMIN 1000 MCG/ML IJ SOLN: INTRAMUSCULAR | 1 refills | Status: DC

## 2019-08-20 MED ORDER — "SYRINGE 25G X 1"" 3 ML MISC": 1.0000 "application " | 0 refills | Status: DC

## 2019-08-20 MED ORDER — FUROSEMIDE 80 MG PO TABS: 80.0000 mg | ORAL_TABLET | Freq: Every day | ORAL | 1 refills | Status: DC

## 2019-08-21 ENCOUNTER — Other Ambulatory Visit: Payer: Self-pay

## 2019-08-21 ENCOUNTER — Other Ambulatory Visit (INDEPENDENT_AMBULATORY_CARE_PROVIDER_SITE_OTHER): Payer: Medicare Other

## 2019-08-21 DIAGNOSIS — L659 Nonscarring hair loss, unspecified: Secondary | ICD-10-CM | POA: Diagnosis not present

## 2019-08-21 DIAGNOSIS — R609 Edema, unspecified: Secondary | ICD-10-CM | POA: Diagnosis not present

## 2019-08-22 LAB — COMPREHENSIVE METABOLIC PANEL
ALT: 17 U/L (ref 0–35)
AST: 17 U/L (ref 0–37)
Albumin: 4 g/dL (ref 3.5–5.2)
Alkaline Phosphatase: 45 U/L (ref 39–117)
BUN: 27 mg/dL — ABNORMAL HIGH (ref 6–23)
CO2: 28 mEq/L (ref 19–32)
Calcium: 10.1 mg/dL (ref 8.4–10.5)
Chloride: 101 mEq/L (ref 96–112)
Creatinine, Ser: 1.16 mg/dL (ref 0.40–1.20)
GFR: 44.41 mL/min — ABNORMAL LOW (ref >60.00)
Glucose, Bld: 165 mg/dL — ABNORMAL HIGH (ref 70–99)
Potassium: 3.9 mEq/L (ref 3.5–5.1)
Sodium: 140 mEq/L (ref 135–145)
Total Bilirubin: 0.5 mg/dL (ref 0.2–1.2)
Total Protein: 6.7 g/dL (ref 6.0–8.3)

## 2019-08-22 LAB — CBC WITH DIFFERENTIAL/PLATELET
Basophils Absolute: 0.1 10*3/uL (ref 0.0–0.1)
Basophils Relative: 0.8 % (ref 0.0–3.0)
Eosinophils Absolute: 0.1 10*3/uL (ref 0.0–0.7)
Eosinophils Relative: 0.4 % (ref 0.0–5.0)
HCT: 39.1 % (ref 36.0–46.0)
Hemoglobin: 12.6 g/dL (ref 12.0–15.0)
Lymphocytes Relative: 11.4 % — ABNORMAL LOW (ref 12.0–46.0)
Lymphs Abs: 1.5 10*3/uL (ref 0.7–4.0)
MCHC: 32.2 g/dL (ref 30.0–36.0)
MCV: 99.9 fl (ref 78.0–100.0)
Monocytes Absolute: 0.4 10*3/uL (ref 0.1–1.0)
Monocytes Relative: 3.3 % (ref 3.0–12.0)
Neutro Abs: 10.7 10*3/uL — ABNORMAL HIGH (ref 1.4–7.7)
Neutrophils Relative %: 84.1 % — ABNORMAL HIGH (ref 43.0–77.0)
Platelets: 342 10*3/uL (ref 150.0–400.0)
RBC: 3.92 Mil/uL (ref 3.87–5.11)
RDW: 14.2 % (ref 11.5–15.5)
WBC: 12.7 10*3/uL — ABNORMAL HIGH (ref 4.0–10.5)

## 2019-08-22 LAB — TSH: TSH: 0.59 u[IU]/mL (ref 0.35–4.50)

## 2019-08-22 LAB — MAGNESIUM: Magnesium: 1.8 mg/dL (ref 1.5–2.5)

## 2019-08-22 LAB — T4, FREE: Free T4: 2.68 ng/dL — ABNORMAL HIGH (ref 0.60–1.60)

## 2019-08-25 ENCOUNTER — Other Ambulatory Visit: Payer: Self-pay

## 2019-08-25 DIAGNOSIS — L989 Disorder of the skin and subcutaneous tissue, unspecified: Secondary | ICD-10-CM

## 2019-09-06 ENCOUNTER — Encounter: Payer: Self-pay | Admitting: Family Medicine

## 2019-09-06 ENCOUNTER — Encounter: Payer: Self-pay | Admitting: Rheumatology

## 2019-09-08 ENCOUNTER — Other Ambulatory Visit: Payer: Self-pay

## 2019-09-08 DIAGNOSIS — E7800 Pure hypercholesterolemia, unspecified: Secondary | ICD-10-CM

## 2019-09-08 MED ORDER — ATORVASTATIN CALCIUM 20 MG PO TABS: 20.0000 mg | ORAL_TABLET | Freq: Every day | ORAL | 3 refills | Status: DC

## 2019-09-08 NOTE — Telephone Encounter (Signed)
She is a still on prednisone.  Her rheumatoid arthritis is not under control.  If she wants to come off Plaquenil she can.  The hair loss with Plaquenil is minimal compared to other drugs.  Most of the other medications are going to be more aggressive with more side effects.  If she wants to discontinue Plaquenil and consider other medications in the future that she may discontinue Plaquenil without tapering.  If she starts having swelling she may have to schedule appointment to start on any medication.

## 2019-10-02 ENCOUNTER — Other Ambulatory Visit: Payer: Self-pay | Admitting: Allergy

## 2019-10-30 NOTE — Progress Notes (Signed)
Virtual Visit via Video Note  I connected with Eileen Wilkerson on 11/12/19 at 11:00 AM EST by a video enabled telemedicine application and verified that I am speaking with the correct person using two identifiers.  Location: Patient: Home  Provider: Clinic  This service was conducted via virtual visit.  Both audio and visual tools were used.  The patient was located at home. I was located in my office.  Consent was obtained prior to the virtual visit and is aware of possible charges through their insurance for this visit.  The patient is an established patient.  Dr. Estanislado Pandy, MD conducted the virtual visit and Hazel Sams, PA-C acted as scribe during the service.  Office staff helped with scheduling follow up visits after the service was conducted.   I discussed the limitations of evaluation and management by telemedicine and the availability of in person appointments. The patient expressed understanding and agreed to proceed.  CC: Pain in both thumbs History of Present Illness: Patient is a 84 year old female with a past medical history of seropositive rheumatoid arthritis, osteoarthritis, fibromyalgia, and osteoporosis.  She has been off of PLQ since 09/08/19 due to hair loss. She continues to take prednisone 10 mg po daily. She is having pain and swelling in bilateral 1st MCP joints bilaterally.  She is having a flare up of costochondritis related to christmas decorating.  She continues to have generalized muscle aches and muscle tenderness due to fibromyalgia.     Review of Systems  Constitutional: Negative for fever and malaise/fatigue.  Eyes: Negative for photophobia, pain, discharge and redness.  Respiratory: Negative for cough, shortness of breath and wheezing.   Cardiovascular: Negative for chest pain and palpitations.  Gastrointestinal: Negative for blood in stool, constipation and diarrhea.  Genitourinary: Negative for dysuria.  Musculoskeletal: Negative for back pain, joint  pain, myalgias and neck pain.  Skin: Negative for rash.  Neurological: Negative for dizziness and headaches.  Psychiatric/Behavioral: Negative for depression. The patient is not nervous/anxious and does not have insomnia.       Observations/Objective: Physical Exam  Constitutional: She is oriented to person, place, and time and well-developed, well-nourished, and in no distress.  HENT:  Head: Normocephalic and atraumatic.  Eyes: Conjunctivae are normal.  Pulmonary/Chest: Effort normal.  Neurological: She is alert and oriented to person, place, and time.  Psychiatric: Mood, memory, affect and judgment normal.   Patient reports morning stiffness for  1-2  hours.   Patient denies nocturnal pain.  Difficulty dressing/grooming: Denies Difficulty climbing stairs: Reports Difficulty getting out of chair: Reports Difficulty using hands for taps, buttons, cutlery, and/or writing: Reports   Assessment and Plan: Visit Diagnoses: Rheumatoid arthritis involving multiple sites with positive rheumatoid factor ([place]) - She has chronic pain and intermittent joint inflammation in both hands. She discontinued plaquenil in September 2020 due to hair loss.  She takes prednisone 10 mg po daily.  She previously discontinued MTX due to recurrent infections.  She does not want to take any immunosuppressive agents at this time.  We discussed the long term risks of prednisone use.  She was advised to notify us if she would like to restart on Plaquenil in the future.  She will follow up in 6 months.  High risk medication use - Prednisone 10 mg daily.  She discontinued PLQ in September 2020 due to hair loss.  Last Plaquenil eye exam normal on 10/22/2018.  She previously discontinued MTX in 12/2017 due to frequent infections.  She does  not want to take any immunosuppressive agents at this time.    Primary osteoarthritis of both hands -She has chronic pain and intermittent swelling in both hands.  She is taking  prednisone 10 mg po daily.  Primary osteoarthritis of both knees -Dr. Illene Silver has chronic pain in both knee joints. She uses a walker to assist with ambulation.   Primary osteoarthritis of both feet -She has not pain in her feet at this time.   Age-related osteoporosis without current pathological fracture - Last bone density from December 27, 2017 showed a T score of -1.7.  She was treated with Reclast in the past.  She is on a drug holiday currently. She is taking a vitamin D supplement.   Fibromyalgia -She has generalized muscle aches and muscle tenderness due to fibromyalgia.  She is having a bout of intercostal muscle tenderness, which states is related to hanging christmas decorations this past weekend.  She was encouraged to use heat compresses for symptomatic relief. She takes prednisone 10 mg po daily.   DDD (degenerative disc disease), cervical - She has chronic pain in her C-spine.  History of fatigue - Chronic and related to insomnia.   Other medical conditions are listed as follows:   Adrenal insufficiency (HCC)  History of CHF (congestive heart failure)  Follow Up Instructions: She will follow up in 6 months.    I discussed the assessment and treatment plan with the patient. The patient was provided an opportunity to ask questions and all were answered. The patient agreed with the plan and demonstrated an understanding of the instructions.   The patient was advised to call back or seek an in-person evaluation if the symptoms worsen or if the condition fails to improve as anticipated.  I provided 25 minutes of non-face-to-face time during this encounter.   Bo Merino, MD   Scribed by-  Hazel Sams, PA-C

## 2019-11-05 ENCOUNTER — Other Ambulatory Visit: Payer: Self-pay | Admitting: Allergy

## 2019-11-12 ENCOUNTER — Encounter: Payer: Self-pay | Admitting: Rheumatology

## 2019-11-12 ENCOUNTER — Other Ambulatory Visit: Payer: Self-pay

## 2019-11-12 ENCOUNTER — Telehealth (INDEPENDENT_AMBULATORY_CARE_PROVIDER_SITE_OTHER): Payer: Medicare Other | Admitting: Rheumatology

## 2019-11-12 DIAGNOSIS — M797 Fibromyalgia: Secondary | ICD-10-CM

## 2019-11-12 DIAGNOSIS — M19042 Primary osteoarthritis, left hand: Secondary | ICD-10-CM

## 2019-11-12 DIAGNOSIS — M0579 Rheumatoid arthritis with rheumatoid factor of multiple sites without organ or systems involvement: Secondary | ICD-10-CM

## 2019-11-12 DIAGNOSIS — Z79899 Other long term (current) drug therapy: Secondary | ICD-10-CM

## 2019-11-12 DIAGNOSIS — M170 Bilateral primary osteoarthritis of knee: Secondary | ICD-10-CM

## 2019-11-12 DIAGNOSIS — M19041 Primary osteoarthritis, right hand: Secondary | ICD-10-CM | POA: Diagnosis not present

## 2019-11-12 DIAGNOSIS — M19072 Primary osteoarthritis, left ankle and foot: Secondary | ICD-10-CM

## 2019-11-12 DIAGNOSIS — M19071 Primary osteoarthritis, right ankle and foot: Secondary | ICD-10-CM

## 2019-11-12 DIAGNOSIS — M5030 Other cervical disc degeneration, unspecified cervical region: Secondary | ICD-10-CM

## 2019-11-12 DIAGNOSIS — E2740 Unspecified adrenocortical insufficiency: Secondary | ICD-10-CM

## 2019-11-12 DIAGNOSIS — M810 Age-related osteoporosis without current pathological fracture: Secondary | ICD-10-CM

## 2019-11-12 DIAGNOSIS — Z8679 Personal history of other diseases of the circulatory system: Secondary | ICD-10-CM

## 2019-11-12 DIAGNOSIS — Z87898 Personal history of other specified conditions: Secondary | ICD-10-CM

## 2019-11-17 ENCOUNTER — Other Ambulatory Visit: Payer: Self-pay | Admitting: Neurosurgery

## 2019-11-17 ENCOUNTER — Other Ambulatory Visit: Payer: Self-pay | Admitting: Rheumatology

## 2019-11-17 ENCOUNTER — Other Ambulatory Visit: Payer: Self-pay | Admitting: Allergy

## 2019-11-17 DIAGNOSIS — M542 Cervicalgia: Secondary | ICD-10-CM

## 2019-11-17 DIAGNOSIS — M0579 Rheumatoid arthritis with rheumatoid factor of multiple sites without organ or systems involvement: Secondary | ICD-10-CM

## 2019-11-18 NOTE — Telephone Encounter (Signed)
Last Visit: 11/12/2019 telemedicine  Next Visit: message sent to the front desk to schedule.   Okay to refill per Dr. Estanislado Pandy.

## 2019-11-19 ENCOUNTER — Ambulatory Visit: Payer: Medicare Other | Admitting: Allergy

## 2019-11-20 ENCOUNTER — Ambulatory Visit
Admission: RE | Admit: 2019-11-20 | Discharge: 2019-11-20 | Disposition: A | Discharge status: Home / Self Care | Payer: Medicare Other | Source: Ambulatory Visit | Attending: Neurosurgery | Admitting: Neurosurgery

## 2019-11-20 ENCOUNTER — Other Ambulatory Visit: Payer: Self-pay

## 2019-11-20 DIAGNOSIS — M542 Cervicalgia: Secondary | ICD-10-CM

## 2019-11-21 ENCOUNTER — Other Ambulatory Visit: Payer: Self-pay | Admitting: Family Medicine

## 2019-11-21 DIAGNOSIS — M797 Fibromyalgia: Secondary | ICD-10-CM

## 2019-11-21 DIAGNOSIS — M0579 Rheumatoid arthritis with rheumatoid factor of multiple sites without organ or systems involvement: Secondary | ICD-10-CM

## 2019-11-21 DIAGNOSIS — M47816 Spondylosis without myelopathy or radiculopathy, lumbar region: Secondary | ICD-10-CM

## 2019-11-26 ENCOUNTER — Other Ambulatory Visit: Payer: Medicare Other

## 2019-11-26 ENCOUNTER — Telehealth: Payer: Self-pay | Admitting: Rheumatology

## 2019-11-26 NOTE — Telephone Encounter (Signed)
-----   Message from Shona Needles, RT sent at 11/13/2019  3:53 PM EST ----- Regarding: F/U IN 6 MONTHS

## 2019-11-26 NOTE — Telephone Encounter (Signed)
Attempted to contact patient twice in reference to scheduling a follow up appointment around 05/12/2020. Someone picked up, and disconnected call x2.

## 2019-11-27 ENCOUNTER — Other Ambulatory Visit: Payer: Self-pay | Admitting: Family Medicine

## 2019-11-27 DIAGNOSIS — Z7952 Long term (current) use of systemic steroids: Secondary | ICD-10-CM

## 2019-11-27 DIAGNOSIS — M0579 Rheumatoid arthritis with rheumatoid factor of multiple sites without organ or systems involvement: Secondary | ICD-10-CM

## 2019-11-27 DIAGNOSIS — M47816 Spondylosis without myelopathy or radiculopathy, lumbar region: Secondary | ICD-10-CM

## 2019-11-28 ENCOUNTER — Other Ambulatory Visit: Payer: Self-pay | Admitting: Family Medicine

## 2019-11-28 DIAGNOSIS — K219 Gastro-esophageal reflux disease without esophagitis: Secondary | ICD-10-CM

## 2019-12-02 ENCOUNTER — Telehealth: Payer: Self-pay | Admitting: Family Medicine

## 2019-12-02 ENCOUNTER — Telehealth: Payer: Self-pay

## 2019-12-02 DIAGNOSIS — M0579 Rheumatoid arthritis with rheumatoid factor of multiple sites without organ or systems involvement: Secondary | ICD-10-CM

## 2019-12-02 DIAGNOSIS — M797 Fibromyalgia: Secondary | ICD-10-CM

## 2019-12-02 DIAGNOSIS — M47816 Spondylosis without myelopathy or radiculopathy, lumbar region: Secondary | ICD-10-CM

## 2019-12-02 NOTE — Telephone Encounter (Signed)
I called the patient to schedule AWV with Eileen Wilkerson.  She said that she's now at [facility] and is considering switching to the doctor who comes there, but she hasn't quite made up her mind.  She asked for a little bit of time to make the decision.

## 2019-12-02 NOTE — Telephone Encounter (Signed)
Refill request received for tramadol. Dr Yong Channel has been added for PCP but has never seen patient. Please call patient have them set up an appointment to establish with another provider. Let me know when app has been made so that I can send for refill.

## 2019-12-03 NOTE — Telephone Encounter (Signed)
We have patient set up for [facility] with you ok ok fill?

## 2019-12-03 NOTE — Telephone Encounter (Signed)
Pt has been scheduled for TOC with Dr. Jonni Sanger.

## 2019-12-04 MED ORDER — TRAMADOL HCL 50 MG PO TABS: 50.0000 mg | ORAL_TABLET | Freq: Four times a day (QID) | ORAL | 0 refills | Status: DC

## 2019-12-04 NOTE — Telephone Encounter (Signed)
Refilled after chart review. Chronic med

## 2019-12-06 NOTE — Telephone Encounter (Signed)
Refill received should patient be getting from Dr. Estanislado Pandy?

## 2019-12-08 NOTE — Telephone Encounter (Signed)
Yes please request from deveshwar

## 2019-12-08 NOTE — Telephone Encounter (Signed)
Last Visit: 11/12/2019 telemedicine  Next Visit: message sent to the front desk to schedule.   Okay to refill prednisone 10mg  daily?

## 2019-12-08 NOTE — Telephone Encounter (Signed)
Refill was sent to PCP by mistake

## 2019-12-09 ENCOUNTER — Telehealth: Payer: Self-pay

## 2019-12-09 NOTE — Telephone Encounter (Signed)
I believe this is being prescribed by Dr. Estanislado Pandy, rheumatology.  She needs to request from her office.   And for refill requests, please set up med refill for approval or decline. Thanks.

## 2019-12-09 NOTE — Telephone Encounter (Signed)
ok 

## 2019-12-10 NOTE — Telephone Encounter (Signed)
Spoke with patient. She states that Dr. Juleen China prescribed this for her because she has renal failure.

## 2019-12-11 NOTE — Telephone Encounter (Signed)
From chart review, Dr. Estanislado Pandy is ordering this. It is for her rheumatoid arthritis. Please call her office to request refill. It is not for kidney problems.

## 2019-12-13 ENCOUNTER — Other Ambulatory Visit: Payer: Self-pay | Admitting: Family Medicine

## 2019-12-13 DIAGNOSIS — R600 Localized edema: Secondary | ICD-10-CM

## 2019-12-17 NOTE — Telephone Encounter (Signed)
Patient notified. Patient states that she will switch her care to [facility] and would like to cancel her upcoming appointment with Dr. Jonni Sanger.

## 2019-12-18 NOTE — Telephone Encounter (Signed)
Patient's appt has been canceled.

## 2019-12-19 ENCOUNTER — Other Ambulatory Visit: Payer: Self-pay

## 2019-12-19 DIAGNOSIS — R600 Localized edema: Secondary | ICD-10-CM

## 2019-12-19 MED ORDER — POTASSIUM CHLORIDE CRYS ER 10 MEQ PO TBCR: 20.0000 meq | EXTENDED_RELEASE_TABLET | Freq: Two times a day (BID) | ORAL | 1 refills | Status: DC

## 2019-12-24 ENCOUNTER — Encounter: Payer: Self-pay | Admitting: Internal Medicine

## 2019-12-31 ENCOUNTER — Other Ambulatory Visit: Payer: Self-pay | Admitting: Family Medicine

## 2019-12-31 DIAGNOSIS — H1013 Acute atopic conjunctivitis, bilateral: Secondary | ICD-10-CM

## 2019-12-31 DIAGNOSIS — J309 Allergic rhinitis, unspecified: Secondary | ICD-10-CM

## 2019-12-31 DIAGNOSIS — M0579 Rheumatoid arthritis with rheumatoid factor of multiple sites without organ or systems involvement: Secondary | ICD-10-CM

## 2019-12-31 DIAGNOSIS — M47816 Spondylosis without myelopathy or radiculopathy, lumbar region: Secondary | ICD-10-CM

## 2019-12-31 DIAGNOSIS — M797 Fibromyalgia: Secondary | ICD-10-CM

## 2020-01-01 NOTE — Telephone Encounter (Signed)
Called the patient and she wanted to let us know she is establishing care with another provider.

## 2020-01-01 NOTE — Telephone Encounter (Signed)
Pt cancelled her TOC appt: I will refill this for this month but patient needs to establish with a new provider prior to next refill. Thanks.

## 2020-01-02 ENCOUNTER — Encounter: Payer: Self-pay | Admitting: Internal Medicine

## 2020-01-02 ENCOUNTER — Non-Acute Institutional Stay: Payer: Medicare PPO | Admitting: Internal Medicine

## 2020-01-02 ENCOUNTER — Other Ambulatory Visit: Payer: Self-pay

## 2020-01-02 VITALS — BP 122/76 | HR 90 | Temp 97.7°F | Ht 64.0 in | Wt 176.2 lb

## 2020-01-02 DIAGNOSIS — Z7952 Long term (current) use of systemic steroids: Secondary | ICD-10-CM | POA: Diagnosis not present

## 2020-01-02 DIAGNOSIS — R600 Localized edema: Secondary | ICD-10-CM

## 2020-01-02 DIAGNOSIS — R2681 Unsteadiness on feet: Secondary | ICD-10-CM

## 2020-01-02 DIAGNOSIS — M810 Age-related osteoporosis without current pathological fracture: Secondary | ICD-10-CM

## 2020-01-02 DIAGNOSIS — M797 Fibromyalgia: Secondary | ICD-10-CM

## 2020-01-02 DIAGNOSIS — L659 Nonscarring hair loss, unspecified: Secondary | ICD-10-CM

## 2020-01-02 DIAGNOSIS — E785 Hyperlipidemia, unspecified: Secondary | ICD-10-CM | POA: Diagnosis not present

## 2020-01-02 DIAGNOSIS — M0579 Rheumatoid arthritis with rheumatoid factor of multiple sites without organ or systems involvement: Secondary | ICD-10-CM | POA: Diagnosis not present

## 2020-01-02 DIAGNOSIS — E538 Deficiency of other specified B group vitamins: Secondary | ICD-10-CM | POA: Diagnosis not present

## 2020-01-02 NOTE — Progress Notes (Signed)
Location:  [place] of Service:  Clinic (12)  Provider:   Code Status:  Goals of Care:  Advanced Directives 07/09/2018  Does Patient Have a Medical Advance Directive? Yes  Type of Paramedic of [place];Living will  Does patient want to make changes to medical advance directive? No - Patient declined  Copy of [place] in Chart? No - copy requested  Would patient like information on creating a medical advance directive? -     Chief Complaint  Patient presents with  . New Admit To SNF    Patient here to establish care. She reports falling last year in July and Dec. with no injury. She would aslo like to review er medications. 2017 and 2018 she was hospitaalized with pneumonia and 2019 with sepsis.     HPI: Patient is a 86 y.o. female seen today for medical management of chronic diseases.   Her active problems are  Sero positive rheumatoid arthritis Follows with Dr. Estanislado Pandy Has pain and swelling in her MCP joints bilateral.  She was initially on methotrexate is taken off it due to recurrent infections.  Then she was started on Plaquenil.  she started having hair loss and stopped it herself.  She has seen Dr. Estanislado Pandy recently Continued her on prednisone 10 mg.  Patient at this time has refused to consider any other immuno suppressive therapy. Patient wanted to know if she should decrease the dose of prednisone.  Chronic pain in her hands and both knees.  Possible fibromyalgia At this time not on anything except prednisone History of adrenal insufficiency She has seen Dr. Chalmers Cater and Dr. Dwyane Dee. Was on 7 mg pf Prednisone for Mainentence LE edema On High Dose of Lasix Seems to be stable EF of 55%  Osteoporosis Was treated with Reclast in the Past Follows with Rheumatology Allergic Rhinitis Says only Fluticasone works for her Chronic Back pain S/p Steroid injections Does not want to do it anymore  Had 2  falls in past few months . Seem Mechanical falls. Wants to know if she can do therapy to help with pain and Unstable gait    Past Medical History:  Diagnosis Date  . Adrenal failure ([place])   . Arthritis   . Asthma   . Cataract   . Closed nondisplaced fracture of fifth right metatarsal bone 09/18/2017  . Fibromyalgia 2008  . HCAP (healthcare-associated pneumonia) 02/03/2018  . Osteoporosis   . RA (rheumatoid arthritis) ([place])   . Recurrent upper respiratory infection (URI)   . Sepsis due to urinary tract infection ([place]) 01/18/2018  . Urticaria     Past Surgical History:  Procedure Laterality Date  . BACK SURGERY    . BILATERAL CARPAL TUNNEL RELEASE  2005   right and left  . CATARACT EXTRACTION, BILATERAL  2004   right and left  . CERVICAL FUSION  2011,2010,2008   2 disks  . HEEL SPUR SURGERY  2004  . lower back fusion  2011   Fusion of 3-4 and 4-5 lower back  . [place]  . ROTATOR CUFF REPAIR  [id number]  . TONSILLECTOMY AND ADENOIDECTOMY  1947  . TOTAL SHOULDER ARTHROPLASTY      Allergies  Allergen Reactions  . Adhesive [Tape] Rash  . Celebrex [Celecoxib] Hives  . Ciprofibrate Nausea Only  . Cymbalta [Duloxetine Hcl] Swelling  . Gabitril [Tiagabine] Swelling  . Keflex [Cephalexin] Nausea And Vomiting  . Lyrica [Pregabalin] Swelling  .  Neurontin [Gabapentin] Swelling  . Nexium [Esomeprazole] Rash  . Nsaids Rash  . Penicillins Rash    Injection site reaction. Tolerated cefepime in past Has patient had a PCN reaction causing immediate rash, facial/tongue/throat swelling, SOB or lightheadedness with hypotension: No Has patient had a PCN reaction causing severe rash involving mucus membranes or skin necrosis: No Has patient had a PCN reaction that required hospitalization No Has patient had a PCN reaction occurring within the last 10 years: No If all of the above answers are "NO", then may proceed with Cephalosporin use.   . Shrimp [Shellfish  Allergy] Anaphylaxis    Per patient "shrimp only"  . Sulfa Antibiotics Nausea And Vomiting  . Azactam [Aztreonam]     Hand swelling   . Azelastine Hcl     Rash   . Ciprofloxacin Other (See Comments)    dizziness  . Claritin [Loratadine]     Irritability Nervousness   . Methotrexate Derivatives   . Nasacort [Triamcinolone]     Dizzy   . Olopatadine Other (See Comments)    Pain and lethargy   . Sulfamethizole   . Zantac [Ranitidine Hcl]   . Claritin-D 12 Hour [Loratadine-Pseudoephedrine Er] Anxiety    Outpatient Encounter Medications as of 01/02/2020  Medication Sig  . acetaminophen (TYLENOL) 500 MG tablet Take 500 mg by mouth every 6 (six) hours.   Marland Kitchen atorvastatin (LIPITOR) 20 MG tablet Take 1 tablet (20 mg total) by mouth daily.  . Biotin (BIOTIN MAXIMUM STRENGTH) 10 MG TABS Take 10 mg by mouth daily.  . Cholecalciferol (VITAMIN D3) 5000 units CAPS Take 5,000 Units by mouth daily.  Marland Kitchen CRANBERRY PO Take 1 capsule by mouth daily.  . cyanocobalamin (,VITAMIN B-12,) 1000 MCG/ML injection 1000 mcg injection once per month.  . cycloSPORINE (RESTASIS) 0.05 % ophthalmic emulsion 1 drop 2 (two) times daily.  . diclofenac Sodium (VOLTAREN) 1 % GEL APPLY 2 TO 4 GRAMS  TOPICALLY TO AFFECTED JOINTS UP TO 4 TIMES DAILY  . EPINEPHrine 0.3 mg/0.3 mL IJ SOAJ injection INJECT 0.3MLS INTO THE MUSCLE ONCE FOR A ONE TIME DOSE  . fexofenadine (ALLEGRA) 180 MG tablet Take 180 mg by mouth daily.  . fluticasone (FLONASE) 50 MCG/ACT nasal spray USE 1 TO 2 SPRAY(S) IN EACH NOSTRIL ONCE DAILY  . furosemide (LASIX) 80 MG tablet Take 1 tablet (80 mg total) by mouth daily.  Marland Kitchen ipratropium (ATROVENT) 0.03 % nasal spray USE 2 SPRAY(S) IN EACH NOSTRIL THREE TIMES DAILY  . ketoconazole (NIZORAL) 2 % shampoo Apply 1 application topically 2 (two) times a week.  . lansoprazole (PREVACID) 30 MG capsule TAKE 1 CAPSULE BY MOUTH ONCE DAILY AT  12  NOON  . MELATONIN PO Take 2 mg by mouth. 2 nightly  . montelukast  (SINGULAIR) 10 MG tablet Take 1 tablet (10 mg total) by mouth at bedtime.  . potassium chloride (KLOR-CON) 10 MEQ tablet Take 2 tablets (20 mEq total) by mouth 2 (two) times daily.  . predniSONE (DELTASONE) 10 MG tablet Take 1 tablet by mouth once daily with breakfast  . Syringe/Needle, Disp, (SYRINGE 3CC/25GX1") 25G X 1" 3 ML MISC 1 application by Does not apply route every 30 (thirty) days.  . traMADol (ULTRAM) 50 MG tablet Take 1 tablet by mouth 4 times daily  . [DISCONTINUED] hydroxychloroquine (PLAQUENIL) 200 MG tablet Take 1 tablet by mouth once daily  . [DISCONTINUED] methocarbamol (ROBAXIN) 500 MG tablet Take 500 mg by mouth 3 (three) times daily.  . [DISCONTINUED] minoxidil (ROGAINE)  2 % external solution Apply topically 2 (two) times daily.  . [DISCONTINUED] predniSONE (DELTASONE) 1 MG tablet TAKE 2 TABLETS BY MOUTH ONCE DAILY WITH BREAKFAST  . [DISCONTINUED] predniSONE (DELTASONE) 5 MG tablet TAKE 1 TABLET BY MOUTH ONCE DAILY WITH BREAKFAST AND  TAPER  AS  DIRECTED   No facility-administered encounter medications on file as of 01/02/2020.    Review of Systems:  Review of Systems  Constitutional: Positive for activity change.  HENT: Negative.   Respiratory: Negative.   Cardiovascular: Positive for leg swelling.  Gastrointestinal: Negative.   Genitourinary: Positive for urgency.  Musculoskeletal: Positive for arthralgias, back pain, gait problem and myalgias.  Neurological: Positive for weakness.  Psychiatric/Behavioral: Positive for sleep disturbance.  All other systems reviewed and are negative.   Health Maintenance  Topic Date Due  . TETANUS/TDAP  09/07/1951  . PNA vac Low Risk Adult (2 of 2 - PPSV23) 06/26/2018  . INFLUENZA VACCINE  Completed  . DEXA SCAN  Completed    Physical Exam: Vitals:   01/02/20 1025  BP: 122/76  Pulse: 90  Temp: 97.7 F (36.5 C)  SpO2: 95%  Weight: 176 lb 3.2 oz (79.9 kg)  Height: 5\' 4"  (1.626 m)   Body mass index is 30.24  kg/m. Physical Exam  Constitutional: Oriented to person, place, and time. Well-developed and well-nourished.  HENT:  Head: Normocephalic.  Mouth/Throat: Oropharynx is clear and moist.  Eyes: Pupils are equal, round, and reactive to light.  Neck: Neck supple.  Cardiovascular: Normal rate and normal heart sounds.  No murmur heard. Pulmonary/Chest: Effort normal and breath sounds normal. No respiratory distress. No wheezes. She has no rales.  Abdominal: Soft. Bowel sounds are normal. No distension. There is no tenderness. There is no rebound.  Musculoskeletal: Mild edema Bilateral Does have Arthritic changes in her Knee  Hands Walks with little limp due to pain in her Knees Uses walker  Lymphadenopathy: none Neurological: Alert and oriented to person, place, and time. No Focal Deficits Skin: Skin is warm and dry.  Psychiatric: Normal mood and affect. Behavior is normal. Thought content normal.    Labs reviewed: Basic Metabolic Panel: Recent Labs    01/24/19 1437 06/03/19 1426 08/21/19 1520  NA 139 141 140  K 4.0 4.9 3.9  CL 100 102 101  CO2 29 29 28   GLUCOSE 106* 146* 165*  BUN 38* 33* 27*  CREATININE 1.15 0.98 1.16  CALCIUM 10.2 9.6 10.1  MG  --  2.0 1.8  PHOS  --  2.8  --   TSH  --   --  0.59   Liver Function Tests: Recent Labs    01/24/19 1437 06/03/19 1426 08/21/19 1520  AST 18 19 17   ALT 14 21 17   ALKPHOS 58 35* 45  BILITOT 1.0 0.5 0.5  PROT 7.8 6.5 6.7  ALBUMIN 4.4 4.0 4.0   Recent Labs    01/13/19 1148  LIPASE 14.0   No results for input(s): AMMONIA in the last 8760 hours. CBC: Recent Labs    01/24/19 1437 06/03/19 1426 08/21/19 1520  WBC 14.3* 11.4* 12.7*  NEUTROABS 12.2* 9.7* 10.7*  HGB 13.7 12.9 12.6  HCT 42.4 39.1 39.1  MCV 94.8 100.2* 99.9  PLT 339.0 260.0 342.0   Lipid Panel: No results for input(s): CHOL, HDL, LDLCALC, TRIG, CHOLHDL, LDLDIRECT in the last 8760 hours. No results found for: HGBA1C  Procedures since last visit: No  results found.  Assessment/Plan  Bilateral lower extremity edema Continue Lasix Would not  increase the dose Consider Ted hoses and Elevate legs  Rheumatoid arthritis involving multiple sites with positive rheumatoid factor ([place]) - Plan: predniSONE (DELTASONE) 10 MG tablet Need to follow up with Rheumatology before changing the dose Still continues to have Inflammation and pain in her joints  Age-related osteoporosis without current pathological fracture Follows with Rheumatology  Cortisol Def ? Follow up with Endocrinology On Prednisone Fibromyalgia Ultram PRN for Pain  Hair loss Not Using Rogaine anymore  B12 deficiency Levels Normal in 6/20 Hyperlipidemia On Statin Check Lipid Panel Unstable Gait Therapy Consult  Allergic Rhinitis Continue on Fluticasone     Labs/tests ordered:  * No order type specified * Next appt:  Visit date not found    Total time spent in this patient care encounter was  45_  minutes; greater than 50% of the visit spent counseling patient and staff, reviewing records , Labs and coordinating care for problems addressed at this encounter.

## 2020-01-04 MED ORDER — FLUTICASONE PROPIONATE 50 MCG/ACT NA SUSP: 2.0000 | Freq: Every day | NASAL | 0 refills | Status: DC

## 2020-01-04 MED ORDER — PREDNISONE 10 MG PO TABS: 10.0000 mg | ORAL_TABLET | Freq: Every day | ORAL | 0 refills | Status: DC

## 2020-01-05 NOTE — Telephone Encounter (Signed)
Please review

## 2020-01-06 NOTE — Telephone Encounter (Signed)
Needs app

## 2020-01-07 NOTE — Telephone Encounter (Signed)
Patient is seeing another provider.

## 2020-01-08 NOTE — Telephone Encounter (Signed)
Patient is seeing another provider

## 2020-01-11 ENCOUNTER — Encounter: Payer: Self-pay | Admitting: Internal Medicine

## 2020-01-12 ENCOUNTER — Other Ambulatory Visit: Payer: Self-pay | Admitting: *Deleted

## 2020-01-12 DIAGNOSIS — H1013 Acute atopic conjunctivitis, bilateral: Secondary | ICD-10-CM

## 2020-01-12 DIAGNOSIS — J309 Allergic rhinitis, unspecified: Secondary | ICD-10-CM

## 2020-01-12 MED ORDER — MONTELUKAST SODIUM 10 MG PO TABS: 10.0000 mg | ORAL_TABLET | Freq: Every day | ORAL | 0 refills | Status: DC

## 2020-01-19 ENCOUNTER — Other Ambulatory Visit: Payer: Self-pay

## 2020-01-21 DIAGNOSIS — R296 Repeated falls: Secondary | ICD-10-CM | POA: Diagnosis not present

## 2020-01-21 DIAGNOSIS — R260 Ataxic gait: Secondary | ICD-10-CM | POA: Diagnosis not present

## 2020-01-21 DIAGNOSIS — M6281 Muscle weakness (generalized): Secondary | ICD-10-CM | POA: Diagnosis not present

## 2020-01-28 DIAGNOSIS — R296 Repeated falls: Secondary | ICD-10-CM | POA: Diagnosis not present

## 2020-01-28 DIAGNOSIS — R260 Ataxic gait: Secondary | ICD-10-CM | POA: Diagnosis not present

## 2020-01-28 DIAGNOSIS — M6281 Muscle weakness (generalized): Secondary | ICD-10-CM | POA: Diagnosis not present

## 2020-02-02 ENCOUNTER — Encounter: Payer: Medicare Other | Admitting: Family Medicine

## 2020-02-04 ENCOUNTER — Other Ambulatory Visit: Payer: Self-pay | Admitting: Internal Medicine

## 2020-02-04 ENCOUNTER — Encounter: Payer: Self-pay | Admitting: Internal Medicine

## 2020-02-04 DIAGNOSIS — M0579 Rheumatoid arthritis with rheumatoid factor of multiple sites without organ or systems involvement: Secondary | ICD-10-CM

## 2020-02-04 DIAGNOSIS — Z7952 Long term (current) use of systemic steroids: Secondary | ICD-10-CM

## 2020-02-04 DIAGNOSIS — R260 Ataxic gait: Secondary | ICD-10-CM | POA: Diagnosis not present

## 2020-02-04 DIAGNOSIS — M6281 Muscle weakness (generalized): Secondary | ICD-10-CM | POA: Diagnosis not present

## 2020-02-04 DIAGNOSIS — R296 Repeated falls: Secondary | ICD-10-CM | POA: Diagnosis not present

## 2020-02-05 ENCOUNTER — Other Ambulatory Visit: Payer: Self-pay | Admitting: Internal Medicine

## 2020-02-05 DIAGNOSIS — M797 Fibromyalgia: Secondary | ICD-10-CM

## 2020-02-05 DIAGNOSIS — M47816 Spondylosis without myelopathy or radiculopathy, lumbar region: Secondary | ICD-10-CM

## 2020-02-05 DIAGNOSIS — M0579 Rheumatoid arthritis with rheumatoid factor of multiple sites without organ or systems involvement: Secondary | ICD-10-CM

## 2020-02-05 MED ORDER — METHOCARBAMOL 500 MG PO TABS: 500.0000 mg | ORAL_TABLET | Freq: Three times a day (TID) | ORAL | 0 refills | Status: DC | PRN

## 2020-02-05 MED ORDER — TRAMADOL HCL 50 MG PO TABS: ORAL_TABLET | ORAL | 0 refills | Status: DC

## 2020-02-09 ENCOUNTER — Ambulatory Visit (INDEPENDENT_AMBULATORY_CARE_PROVIDER_SITE_OTHER): Payer: Medicare PPO

## 2020-02-09 ENCOUNTER — Other Ambulatory Visit: Payer: Self-pay

## 2020-02-09 ENCOUNTER — Ambulatory Visit: Payer: Medicare PPO | Admitting: Physician Assistant

## 2020-02-09 ENCOUNTER — Encounter: Payer: Self-pay | Admitting: Physician Assistant

## 2020-02-09 DIAGNOSIS — M25571 Pain in right ankle and joints of right foot: Secondary | ICD-10-CM

## 2020-02-09 NOTE — Progress Notes (Signed)
Office Visit Note   Patient: Eileen Wilkerson           Date of Birth: 06-29-1932           MRN: [id number] Visit Date: 02/09/2020              Requested by: No referring provider defined for this encounter. PCP: Patient, No Pcp Per  Chief Complaint  Patient presents with  . Right Foot - Pain  . Right Ankle - Pain      HPI: This is a pleasant woman with a chief complaint of right lateral foot pain.  She has a history of a base of fifth metatarsal fracture twice.  Most recently was in 2018.  She actually was doing well but over the weekend she began having pain in the same area.  She denies any injury or change in activity  Assessment & Plan: Visit Diagnoses:  1. Pain in right ankle and joints of right foot     Plan: We have instructed her in Achilles stretching.  She will follow-up in 3 weeks.  We explained to her that much of her difficulties are probably second to Achilles tightness causing more stress on this area of her foot  Follow-Up Instructions: No follow-ups on file.   Ortho Exam  Patient is alert, oriented, no adenopathy, well-dressed, normal affect, normal respiratory effort. Focused examination demonstrates no soft tissue swelling no erythema.  She is tender over the base of the fifth metatarsal.  She does not quite flex her ankle to neutral.  Significant Achilles contracture  Imaging: No results found. No images are attached to the encounter.  Labs: Lab Results  Component Value Date   ESRSEDRATE 74 (H) 05/17/2017   CRP 2.0 05/17/2017   REPTSTATUS 02/03/2018 FINAL 02/03/2018   REPTSTATUS 02/06/2018 FINAL 02/03/2018   GRAMSTAIN  02/03/2018    MODERATE WBC PRESENT, PREDOMINANTLY MONONUCLEAR MODERATE SQUAMOUS EPITHELIAL CELLS PRESENT MODERATE GRAM POSITIVE RODS MODERATE GRAM POSITIVE COCCI IN PAIRS IN CLUSTERS FEW GRAM NEGATIVE COCCI IN PAIRS RARE GRAM NEGATIVE RODS    CULT  02/03/2018    ABUNDANT Consistent with normal respiratory flora. Performed  at [facility], [place] [address], [place], [place] [address]      Lab Results  Component Value Date   ALBUMIN 4.0 08/21/2019   ALBUMIN 4.0 06/03/2019   ALBUMIN 4.4 01/24/2019    Lab Results  Component Value Date   MG 1.8 08/21/2019   MG 2.0 06/03/2019   MG 2.1 02/13/2018   Lab Results  Component Value Date   VD25OH 51.95 02/21/2018    No results found for: PREALBUMIN CBC EXTENDED Latest Ref Rng & Units 08/21/2019 06/03/2019 01/24/2019  WBC 4.0 - 10.5 K/uL 12.7(H) 11.4(H) 14.3(H)  RBC 3.87 - 5.11 Mil/uL 3.92 3.90 4.47  HGB 12.0 - 15.0 g/dL 12.6 12.9 13.7  HCT 36.0 - 46.0 % 39.1 39.1 42.4  PLT 150.0 - 400.0 K/uL 342.0 260.0 339.0  NEUTROABS 1.4 - 7.7 K/uL 10.7(H) 9.7(H) 12.2(H)  LYMPHSABS 0.7 - 4.0 K/uL 1.5 1.1 1.5     There is no height or weight on file to calculate BMI.  Orders:  Orders Placed This Encounter  Procedures  . XR Ankle Complete Right  . XR Foot Complete Right   No orders of the defined types were placed in this encounter.    Procedures: No procedures performed  Clinical Data: No additional findings.  ROS:  All other systems negative, except as noted in the  HPI. Review of Systems  Objective: Vital Signs: There were no vitals taken for this visit.  Specialty Comments:  No specialty comments available.  PMFS History: Patient Active Problem List   Diagnosis Date Noted  . Hair loss 08/06/2019  . SOB (shortness of breath) on exertion 08/06/2019  . Pure hypercholesterolemia 08/06/2019  . B12 deficiency 03/11/2018  . Idiopathic chronic venous hypertension of both lower extremities with ulcer and inflammation ([place]) 09/18/2017  . Bilateral lower extremity edema 06/30/2017  . Physical deconditioning 05/30/2017  . Immunosuppressed status ([place]) 05/30/2017  . Abnormal chest x-ray 05/17/2017  . Fibromyalgia 12/18/2016  . DJD (degenerative joint disease), cervical 12/18/2016  . Spondylosis of lumbar region without myelopathy or  radiculopathy 12/18/2016  . Trigger finger, right middle finger 12/18/2016  . Primary osteoarthritis of both feet 12/18/2016  . Primary osteoarthritis of both knees 12/18/2016  . GERD (gastroesophageal reflux disease) 12/18/2016  . Age-related osteoporosis without current pathological fracture 12/18/2016  . Rheumatoid arthritis ([place]) 12/28/2015  . Long term current use of systemic steroids, for RA 12/28/2015  . Lymphocytosis 11/13/2012   Past Medical History:  Diagnosis Date  . Adrenal failure ([place])   . Arthritis   . Asthma   . Cataract   . Closed nondisplaced fracture of fifth right metatarsal bone 09/18/2017  . Fibromyalgia 2008  . HCAP (healthcare-associated pneumonia) 02/03/2018  . Osteoporosis   . RA (rheumatoid arthritis) ([place])   . Recurrent upper respiratory infection (URI)   . Sepsis due to urinary tract infection ([place]) 01/18/2018  . Urticaria     Family History  Problem Relation Age of Onset  . Heart attack Maternal Grandmother   . Heart attack Paternal Grandfather   . Breast cancer Mother 56  . Diabetes Father   . Heart disease Father   . Congestive Heart Failure Father 57       Died from  . Allergic rhinitis Neg Hx   . Asthma Neg Hx   . Eczema Neg Hx   . Urticaria Neg Hx     Past Surgical History:  Procedure Laterality Date  . BACK SURGERY    . BILATERAL CARPAL TUNNEL RELEASE  2005   right and left  . CATARACT EXTRACTION, BILATERAL  2004   right and left  . CERVICAL FUSION  2011,2010,2008   2 disks  . HEEL SPUR SURGERY  2004  . lower back fusion  2011   Fusion of 3-4 and 4-5 lower back  . [place]  . ROTATOR CUFF REPAIR  [id number]  . TONSILLECTOMY AND ADENOIDECTOMY  1947  . TOTAL SHOULDER ARTHROPLASTY     Social History   Occupational History  . Not on file  Tobacco Use  . Smoking status: Former Smoker    Packs/day: 0.75    Years: 10.00    Pack years: 7.50    Types: Cigarettes    Quit date: 1968    Years since quitting:  53.2  . Smokeless tobacco: Never Used  Substance and Sexual Activity  . Alcohol use: No  . Drug use: No  . Sexual activity: Not Currently

## 2020-02-11 ENCOUNTER — Encounter: Payer: Self-pay | Admitting: Internal Medicine

## 2020-02-11 DIAGNOSIS — R296 Repeated falls: Secondary | ICD-10-CM | POA: Diagnosis not present

## 2020-02-11 DIAGNOSIS — R260 Ataxic gait: Secondary | ICD-10-CM | POA: Diagnosis not present

## 2020-02-11 DIAGNOSIS — M6281 Muscle weakness (generalized): Secondary | ICD-10-CM | POA: Diagnosis not present

## 2020-02-12 ENCOUNTER — Other Ambulatory Visit: Payer: Self-pay | Admitting: Internal Medicine

## 2020-02-12 DIAGNOSIS — H1013 Acute atopic conjunctivitis, bilateral: Secondary | ICD-10-CM

## 2020-02-12 DIAGNOSIS — J309 Allergic rhinitis, unspecified: Secondary | ICD-10-CM

## 2020-02-12 MED ORDER — CYANOCOBALAMIN 1000 MCG/ML IJ SOLN: INTRAMUSCULAR | 1 refills | Status: DC

## 2020-02-25 ENCOUNTER — Encounter: Payer: Self-pay | Admitting: Internal Medicine

## 2020-02-25 DIAGNOSIS — R600 Localized edema: Secondary | ICD-10-CM

## 2020-02-25 DIAGNOSIS — E7800 Pure hypercholesterolemia, unspecified: Secondary | ICD-10-CM

## 2020-02-26 ENCOUNTER — Other Ambulatory Visit: Payer: Self-pay | Admitting: Internal Medicine

## 2020-02-26 DIAGNOSIS — Z961 Presence of intraocular lens: Secondary | ICD-10-CM | POA: Diagnosis not present

## 2020-02-26 DIAGNOSIS — M0579 Rheumatoid arthritis with rheumatoid factor of multiple sites without organ or systems involvement: Secondary | ICD-10-CM

## 2020-02-26 DIAGNOSIS — H52203 Unspecified astigmatism, bilateral: Secondary | ICD-10-CM | POA: Diagnosis not present

## 2020-02-26 DIAGNOSIS — H04123 Dry eye syndrome of bilateral lacrimal glands: Secondary | ICD-10-CM | POA: Diagnosis not present

## 2020-02-26 DIAGNOSIS — H35372 Puckering of macula, left eye: Secondary | ICD-10-CM | POA: Diagnosis not present

## 2020-02-26 DIAGNOSIS — Z7952 Long term (current) use of systemic steroids: Secondary | ICD-10-CM

## 2020-02-26 DIAGNOSIS — M47816 Spondylosis without myelopathy or radiculopathy, lumbar region: Secondary | ICD-10-CM

## 2020-02-26 DIAGNOSIS — M797 Fibromyalgia: Secondary | ICD-10-CM

## 2020-02-26 MED ORDER — ATORVASTATIN CALCIUM 20 MG PO TABS: 20.0000 mg | ORAL_TABLET | Freq: Every day | ORAL | 3 refills | Status: DC

## 2020-02-26 MED ORDER — FUROSEMIDE 80 MG PO TABS: 80.0000 mg | ORAL_TABLET | Freq: Every day | ORAL | 1 refills | Status: DC

## 2020-03-09 ENCOUNTER — Other Ambulatory Visit: Payer: Self-pay

## 2020-03-09 ENCOUNTER — Encounter: Payer: Self-pay | Admitting: Orthopedic Surgery

## 2020-03-09 ENCOUNTER — Ambulatory Visit: Payer: Medicare PPO | Admitting: Orthopedic Surgery

## 2020-03-09 VITALS — Ht 64.0 in | Wt 176.0 lb

## 2020-03-09 DIAGNOSIS — M25571 Pain in right ankle and joints of right foot: Secondary | ICD-10-CM

## 2020-03-11 ENCOUNTER — Other Ambulatory Visit: Payer: Self-pay | Admitting: Internal Medicine

## 2020-03-11 DIAGNOSIS — H1013 Acute atopic conjunctivitis, bilateral: Secondary | ICD-10-CM

## 2020-03-11 DIAGNOSIS — J309 Allergic rhinitis, unspecified: Secondary | ICD-10-CM

## 2020-03-24 ENCOUNTER — Encounter: Payer: Self-pay | Admitting: Internal Medicine

## 2020-03-24 DIAGNOSIS — K219 Gastro-esophageal reflux disease without esophagitis: Secondary | ICD-10-CM

## 2020-03-25 MED ORDER — LANSOPRAZOLE 30 MG PO CPDR: DELAYED_RELEASE_CAPSULE | ORAL | 1 refills | Status: DC

## 2020-03-25 NOTE — Telephone Encounter (Signed)
Patient requested refill Pended Rx and sent to Dr. Lyndel Safe for approval due to [place].

## 2020-03-26 ENCOUNTER — Encounter: Payer: Self-pay | Admitting: Orthopedic Surgery

## 2020-03-26 NOTE — Progress Notes (Signed)
Office Visit Note   Patient: Eileen Wilkerson           Date of Birth: 27-Dec-1931           MRN: [id number] Visit Date: 03/09/2020              Requested by: No referring provider defined for this encounter. PCP: [name], MD  Chief Complaint  Patient presents with  . Right Foot - Pain      HPI: Patient is an 86 year old woman who presents in follow-up for right foot pain.  She previously has had a fracture through the base of the fifth metatarsal right foot she does have Achilles contracture.  Assessment & Plan: Visit Diagnoses:  1. Pain in right ankle and joints of right foot     Plan: Recommended continued Achilles stretching, stiff soled shoes  Follow-Up Instructions: Return in about 4 weeks (around 04/06/2020), or if symptoms worsen or fail to improve.   Ortho Exam  Patient is alert, oriented, no adenopathy, well-dressed, normal affect, normal respiratory effort. Examination of the right foot patient has good pulses she does have some Achilles contracture.  There is no tenderness to palpation over the fifth metatarsal.  No redness no cellulitis no signs of infection.  Imaging: No results found. No images are attached to the encounter.  Labs: Lab Results  Component Value Date   ESRSEDRATE 74 (H) 05/17/2017   CRP 2.0 05/17/2017   REPTSTATUS 02/03/2018 FINAL 02/03/2018   REPTSTATUS 02/06/2018 FINAL 02/03/2018   GRAMSTAIN  02/03/2018    MODERATE WBC PRESENT, PREDOMINANTLY MONONUCLEAR MODERATE SQUAMOUS EPITHELIAL CELLS PRESENT MODERATE GRAM POSITIVE RODS MODERATE GRAM POSITIVE COCCI IN PAIRS IN CLUSTERS FEW GRAM NEGATIVE COCCI IN PAIRS RARE GRAM NEGATIVE RODS    CULT  02/03/2018    ABUNDANT Consistent with normal respiratory flora. Performed at [facility], [place] [address], [place], [place] [address]      Lab Results  Component Value Date   ALBUMIN 4.0 08/21/2019   ALBUMIN 4.0 06/03/2019   ALBUMIN 4.4 01/24/2019    Lab Results    Component Value Date   MG 1.8 08/21/2019   MG 2.0 06/03/2019   MG 2.1 02/13/2018   Lab Results  Component Value Date   VD25OH 51.95 02/21/2018    No results found for: PREALBUMIN CBC EXTENDED Latest Ref Rng & Units 08/21/2019 06/03/2019 01/24/2019  WBC 4.0 - 10.5 K/uL 12.7(H) 11.4(H) 14.3(H)  RBC 3.87 - 5.11 Mil/uL 3.92 3.90 4.47  HGB 12.0 - 15.0 g/dL 12.6 12.9 13.7  HCT 36.0 - 46.0 % 39.1 39.1 42.4  PLT 150.0 - 400.0 K/uL 342.0 260.0 339.0  NEUTROABS 1.4 - 7.7 K/uL 10.7(H) 9.7(H) 12.2(H)  LYMPHSABS 0.7 - 4.0 K/uL 1.5 1.1 1.5     Body mass index is 30.21 kg/m.  Orders:  No orders of the defined types were placed in this encounter.  No orders of the defined types were placed in this encounter.    Procedures: No procedures performed  Clinical Data: No additional findings.  ROS:  All other systems negative, except as noted in the HPI. Review of Systems  Objective: Vital Signs: Ht 5\' 4"  (1.626 m)   Wt 176 lb (79.8 kg)   BMI 30.21 kg/m   Specialty Comments:  No specialty comments available.  PMFS History: Patient Active Problem List   Diagnosis Date Noted  . Hair loss 08/06/2019  . SOB (shortness of breath) on exertion 08/06/2019  . Pure hypercholesterolemia  08/06/2019  . B12 deficiency 03/11/2018  . Idiopathic chronic venous hypertension of both lower extremities with ulcer and inflammation ([place]) 09/18/2017  . Bilateral lower extremity edema 06/30/2017  . Physical deconditioning 05/30/2017  . Immunosuppressed status ([place]) 05/30/2017  . Abnormal chest x-ray 05/17/2017  . Fibromyalgia 12/18/2016  . DJD (degenerative joint disease), cervical 12/18/2016  . Spondylosis of lumbar region without myelopathy or radiculopathy 12/18/2016  . Trigger finger, right middle finger 12/18/2016  . Primary osteoarthritis of both feet 12/18/2016  . Primary osteoarthritis of both knees 12/18/2016  . GERD (gastroesophageal reflux disease) 12/18/2016  . Age-related  osteoporosis without current pathological fracture 12/18/2016  . Rheumatoid arthritis ([place]) 12/28/2015  . Long term current use of systemic steroids, for RA 12/28/2015  . Lymphocytosis 11/13/2012   Past Medical History:  Diagnosis Date  . Adrenal failure ([place])   . Arthritis   . Asthma   . Cataract   . Closed nondisplaced fracture of fifth right metatarsal bone 09/18/2017  . Fibromyalgia 2008  . HCAP (healthcare-associated pneumonia) 02/03/2018  . Osteoporosis   . RA (rheumatoid arthritis) ([place])   . Recurrent upper respiratory infection (URI)   . Sepsis due to urinary tract infection ([place]) 01/18/2018  . Urticaria     Family History  Problem Relation Age of Onset  . Heart attack Maternal Grandmother   . Heart attack Paternal Grandfather   . Breast cancer Mother 27  . Diabetes Father   . Heart disease Father   . Congestive Heart Failure Father 18       Died from  . Allergic rhinitis Neg Hx   . Asthma Neg Hx   . Eczema Neg Hx   . Urticaria Neg Hx     Past Surgical History:  Procedure Laterality Date  . BACK SURGERY    . BILATERAL CARPAL TUNNEL RELEASE  2005   right and left  . CATARACT EXTRACTION, BILATERAL  2004   right and left  . CERVICAL FUSION  2011,2010,2008   2 disks  . HEEL SPUR SURGERY  2004  . lower back fusion  2011   Fusion of 3-4 and 4-5 lower back  . [place]  . ROTATOR CUFF REPAIR  [id number]  . TONSILLECTOMY AND ADENOIDECTOMY  1947  . TOTAL SHOULDER ARTHROPLASTY     Social History   Occupational History  . Not on file  Tobacco Use  . Smoking status: Former Smoker    Packs/day: 0.75    Years: 10.00    Pack years: 7.50    Types: Cigarettes    Quit date: 1968    Years since quitting: 53.3  . Smokeless tobacco: Never Used  Substance and Sexual Activity  . Alcohol use: No  . Drug use: No  . Sexual activity: Not Currently

## 2020-04-01 ENCOUNTER — Other Ambulatory Visit: Payer: Self-pay | Admitting: Internal Medicine

## 2020-04-01 DIAGNOSIS — Z7952 Long term (current) use of systemic steroids: Secondary | ICD-10-CM

## 2020-04-01 DIAGNOSIS — M0579 Rheumatoid arthritis with rheumatoid factor of multiple sites without organ or systems involvement: Secondary | ICD-10-CM

## 2020-04-01 DIAGNOSIS — M47816 Spondylosis without myelopathy or radiculopathy, lumbar region: Secondary | ICD-10-CM

## 2020-04-01 DIAGNOSIS — M797 Fibromyalgia: Secondary | ICD-10-CM

## 2020-04-06 ENCOUNTER — Other Ambulatory Visit: Payer: Self-pay

## 2020-04-06 DIAGNOSIS — E785 Hyperlipidemia, unspecified: Secondary | ICD-10-CM

## 2020-04-06 DIAGNOSIS — M810 Age-related osteoporosis without current pathological fracture: Secondary | ICD-10-CM

## 2020-04-06 DIAGNOSIS — R600 Localized edema: Secondary | ICD-10-CM

## 2020-04-07 LAB — CBC WITH DIFFERENTIAL/PLATELET
Absolute Monocytes: 946 cells/uL (ref 200–950)
Basophils Absolute: 46 cells/uL (ref 0–200)
Basophils Relative: 0.4 %
Eosinophils Absolute: 137 cells/uL (ref 15–500)
Eosinophils Relative: 1.2 %
HCT: 41.3 % (ref 35.0–45.0)
Hemoglobin: 13.7 g/dL (ref 11.7–15.5)
Lymphs Abs: 4286 cells/uL — ABNORMAL HIGH (ref 850–3900)
MCH: 31.1 pg (ref 27.0–33.0)
MCHC: 33.2 g/dL (ref 32.0–36.0)
MCV: 93.9 fL (ref 80.0–100.0)
MPV: 9.8 fL (ref 7.5–12.5)
Monocytes Relative: 8.3 %
Neutro Abs: 5985 cells/uL (ref 1500–7800)
Neutrophils Relative %: 52.5 %
Platelets: 293 10*3/uL (ref 140–400)
RBC: 4.4 10*6/uL (ref 3.80–5.10)
RDW: 12.6 % (ref 11.0–15.0)
Total Lymphocyte: 37.6 %
WBC: 11.4 10*3/uL — ABNORMAL HIGH (ref 3.8–10.8)

## 2020-04-07 LAB — COMPLETE METABOLIC PANEL WITH GFR
AG Ratio: 1.4 (calc) (ref 1.0–2.5)
ALT: 11 U/L (ref 6–29)
AST: 14 U/L (ref 10–35)
Albumin: 4.2 g/dL (ref 3.6–5.1)
Alkaline phosphatase (APISO): 47 U/L (ref 37–153)
BUN/Creatinine Ratio: 25 (calc) — ABNORMAL HIGH (ref 6–22)
BUN: 26 mg/dL — ABNORMAL HIGH (ref 7–25)
CO2: 30 mmol/L (ref 20–32)
Calcium: 10.3 mg/dL (ref 8.6–10.4)
Chloride: 103 mmol/L (ref 98–110)
Creat: 1.05 mg/dL — ABNORMAL HIGH (ref 0.60–0.88)
GFR, Est African American: 56 mL/min/{1.73_m2} — ABNORMAL LOW (ref >=60)
GFR, Est Non African American: 48 mL/min/{1.73_m2} — ABNORMAL LOW (ref >=60)
Globulin: 2.9 g/dL (calc) (ref 1.9–3.7)
Glucose, Bld: 91 mg/dL (ref 65–99)
Potassium: 4 mmol/L (ref 3.5–5.3)
Sodium: 142 mmol/L (ref 135–146)
Total Bilirubin: 0.5 mg/dL (ref 0.2–1.2)
Total Protein: 7.1 g/dL (ref 6.1–8.1)

## 2020-04-07 LAB — LIPID PANEL
Cholesterol: 176 mg/dL (ref <200)
HDL: 67 mg/dL (ref >=50)
LDL Cholesterol (Calc): 82 mg/dL (calc)
Non-HDL Cholesterol (Calc): 109 mg/dL (calc) (ref <130)
Total CHOL/HDL Ratio: 2.6 (calc) (ref <5.0)
Triglycerides: 171 mg/dL — ABNORMAL HIGH (ref <150)

## 2020-04-07 LAB — TSH: TSH: 3.16 mIU/L (ref 0.40–4.50)

## 2020-04-07 LAB — VITAMIN D 25 HYDROXY (VIT D DEFICIENCY, FRACTURES): Vit D, 25-Hydroxy: 61 ng/mL (ref 30–100)

## 2020-04-09 ENCOUNTER — Non-Acute Institutional Stay: Payer: Medicare PPO | Admitting: Internal Medicine

## 2020-04-09 ENCOUNTER — Other Ambulatory Visit: Payer: Self-pay

## 2020-04-09 ENCOUNTER — Encounter: Payer: Self-pay | Admitting: Internal Medicine

## 2020-04-09 VITALS — BP 122/78 | HR 77 | Temp 97.3°F | Ht 64.0 in | Wt 173.2 lb

## 2020-04-09 DIAGNOSIS — R600 Localized edema: Secondary | ICD-10-CM | POA: Diagnosis not present

## 2020-04-09 DIAGNOSIS — E538 Deficiency of other specified B group vitamins: Secondary | ICD-10-CM

## 2020-04-09 DIAGNOSIS — E785 Hyperlipidemia, unspecified: Secondary | ICD-10-CM

## 2020-04-09 DIAGNOSIS — H1013 Acute atopic conjunctivitis, bilateral: Secondary | ICD-10-CM

## 2020-04-09 DIAGNOSIS — J309 Allergic rhinitis, unspecified: Secondary | ICD-10-CM | POA: Diagnosis not present

## 2020-04-09 DIAGNOSIS — M797 Fibromyalgia: Secondary | ICD-10-CM

## 2020-04-09 DIAGNOSIS — M810 Age-related osteoporosis without current pathological fracture: Secondary | ICD-10-CM

## 2020-04-09 DIAGNOSIS — M0579 Rheumatoid arthritis with rheumatoid factor of multiple sites without organ or systems involvement: Secondary | ICD-10-CM

## 2020-04-09 MED ORDER — PREDNISONE 20 MG PO TABS: 20.0000 mg | ORAL_TABLET | Freq: Every day | ORAL | 0 refills | Status: DC

## 2020-04-09 NOTE — Progress Notes (Signed)
Location: [place] of Service:  Clinic (12)  Provider:   Code Status: Goals of Care:  Advanced Directives 04/09/2020  Does Patient Have a Medical Advance Directive? Yes  Type of Advance Directive Living will;Healthcare Power of Attorney  Does patient want to make changes to medical advance directive? No - Patient declined  Copy of [place] in Chart? Yes - validated most recent copy scanned in chart (See row information)  Would patient like information on creating a medical advance directive? -     Chief Complaint  Patient presents with  . Medical Management of Chronic Issues    3 month follow up  . Health Maintenance    TDAP, PNA    HPI: Patient is a 86 y.o. female seen today for an acute visit for Follow up of her Multiple Problems  Sero positive rheumatoid arthritis Follows with Dr. Estanislado Pandy Has pain and swelling in her MCP joints bilateral.  She was initially on methotrexate is taken off it due to recurrent infections.  Then she was started on Plaquenil.  she started having hair loss and stopped it herself.  She has seen Dr. Estanislado Pandy recently Continued her on prednisone 10 mg.  Patient at this time has refused to consider any other immuno suppressive therapy. Continues to have Chronic Pain Involving her Hands, and Neck  Lower Right Foot Pain C/o Pain in Right Foot. Was seen by Ortho Dr Sharol Given. Negative for fracture. But Patient says it hurts especially when she walks. Mild Swelling No Injury  Allergic rhinitis Is on Allegra fluticasone and Singulair Says she is still having issues.  Thinks that she occasionally has wheezing. Other issues which were stable today History of adrenal insufficiency  She has seen Dr. Chalmers Cater and Dr. Dwyane Dee.  Was on 7 mg pf Prednisone for [place]  LE edema  On High Dose of Lasix  Seems to be stable  EF of 55%  Osteoporosis  Was treated with Reclast in the Past  Follows with Rheumatology    Chronic Back pain  S/p Steroid injections  Does not want to do it anymore   Past Medical History:  Diagnosis Date  . Adrenal failure ([place])   . Arthritis   . Asthma   . Cataract   . Closed nondisplaced fracture of fifth right metatarsal bone 09/18/2017  . Fibromyalgia 2008  . HCAP (healthcare-associated pneumonia) 02/03/2018  . Osteoporosis   . RA (rheumatoid arthritis) ([place])   . Recurrent upper respiratory infection (URI)   . Sepsis due to urinary tract infection ([place]) 01/18/2018  . Urticaria     Past Surgical History:  Procedure Laterality Date  . BACK SURGERY    . BILATERAL CARPAL TUNNEL RELEASE  2005   right and left  . CATARACT EXTRACTION, BILATERAL  2004   right and left  . CERVICAL FUSION  2011,2010,2008   2 disks  . HEEL SPUR SURGERY  2004  . lower back fusion  2011   Fusion of 3-4 and 4-5 lower back  . [place]  . ROTATOR CUFF REPAIR  [id number]  . TONSILLECTOMY AND ADENOIDECTOMY  1947  . TOTAL SHOULDER ARTHROPLASTY      Allergies  Allergen Reactions  . Adhesive [Tape] Rash  . Celebrex [Celecoxib] Hives  . Ciprofibrate Nausea Only  . Cymbalta [Duloxetine Hcl] Swelling  . Gabitril [Tiagabine] Swelling  . Keflex [Cephalexin] Nausea And Vomiting  . Lyrica [Pregabalin] Swelling  . Neurontin [Gabapentin] Swelling  .  Nexium [Esomeprazole] Rash  . Nsaids Rash  . Penicillins Rash    Injection site reaction. Tolerated cefepime in past Has patient had a PCN reaction causing immediate rash, facial/tongue/throat swelling, SOB or lightheadedness with hypotension: No Has patient had a PCN reaction causing severe rash involving mucus membranes or skin necrosis: No Has patient had a PCN reaction that required hospitalization No Has patient had a PCN reaction occurring within the last 10 years: No If all of the above answers are "NO", then may proceed with Cephalosporin use.   . Shrimp [Shellfish Allergy] Anaphylaxis    Per patient "shrimp only"   . Sulfa Antibiotics Nausea And Vomiting  . Azactam [Aztreonam]     Hand swelling   . Azelastine Hcl     Rash   . Ciprofloxacin Other (See Comments)    dizziness  . Claritin [Loratadine]     Irritability Nervousness   . Methotrexate Derivatives   . Nasacort [Triamcinolone]     Dizzy   . Olopatadine Other (See Comments)    Pain and lethargy   . Sulfamethizole   . Zantac [Ranitidine Hcl]   . Claritin-D 12 Hour [Loratadine-Pseudoephedrine Er] Anxiety    Outpatient Encounter Medications as of 04/09/2020  Medication Sig  . acetaminophen (TYLENOL) 500 MG tablet Take 500 mg by mouth every 6 (six) hours.   Marland Kitchen atorvastatin (LIPITOR) 20 MG tablet Take 1 tablet (20 mg total) by mouth daily.  . Biotin (BIOTIN MAXIMUM STRENGTH) 10 MG TABS Take 10 mg by mouth daily.  . Cholecalciferol (VITAMIN D3) 5000 units CAPS Take 5,000 Units by mouth daily.  Marland Kitchen CRANBERRY PO Take 1 capsule by mouth daily.  . cyanocobalamin (,VITAMIN B-12,) 1000 MCG/ML injection 1000 mcg injection once per month.  . cycloSPORINE (RESTASIS) 0.05 % ophthalmic emulsion 1 drop 2 (two) times daily.  . diclofenac Sodium (VOLTAREN) 1 % GEL APPLY 2 TO 4 GRAMS  TOPICALLY TO AFFECTED JOINTS UP TO 4 TIMES DAILY  . EPINEPHrine 0.3 mg/0.3 mL IJ SOAJ injection INJECT 0.3MLS INTO THE MUSCLE ONCE FOR A ONE TIME DOSE  . fexofenadine (ALLEGRA) 180 MG tablet Take 180 mg by mouth daily.  . fluticasone (FLONASE) 50 MCG/ACT nasal spray Place 2 sprays into both nostrils daily.  . furosemide (LASIX) 80 MG tablet Take 1 tablet (80 mg total) by mouth daily.  Marland Kitchen ketoconazole (NIZORAL) 2 % shampoo Apply 1 application topically 2 (two) times a week.  . lansoprazole (PREVACID) 30 MG capsule Take one capsule by mouth once daily at 12 noon.  Marland Kitchen MELATONIN PO Take 10 mg by mouth. nightly  . methocarbamol (ROBAXIN) 500 MG tablet Take 1 tablet (500 mg total) by mouth every 8 (eight) hours as needed for muscle spasms.  . montelukast (SINGULAIR) 10 MG tablet TAKE 1  TABLET BY MOUTH AT BEDTIME  . predniSONE (DELTASONE) 10 MG tablet Take 1 tablet by mouth once daily with breakfast  . Syringe/Needle, Disp, (SYRINGE 3CC/25GX1") 25G X 1" 3 ML MISC 1 application by Does not apply route every 30 (thirty) days.  . traMADol (ULTRAM) 50 MG tablet Take 1 tablet by mouth 4 times daily  . ipratropium (ATROVENT) 0.03 % nasal spray USE 2 SPRAY(S) IN EACH NOSTRIL THREE TIMES DAILY (Patient not taking: Reported on 04/09/2020)  . potassium chloride (KLOR-CON) 10 MEQ tablet Take 2 tablets (20 mEq total) by mouth 2 (two) times daily.   No facility-administered encounter medications on file as of 04/09/2020.    Review of Systems:  Review of Systems  Constitutional: Positive for activity change.  HENT: Positive for postnasal drip and rhinorrhea.   Eyes: Positive for discharge and itching.  Respiratory: Positive for wheezing.   Cardiovascular: Positive for leg swelling.  Gastrointestinal: Negative.   Genitourinary: Negative.   Musculoskeletal: Positive for arthralgias, back pain, joint swelling, myalgias, neck pain and neck stiffness.  Skin: Negative.   Neurological: Negative.   Psychiatric/Behavioral: Negative.     Health Maintenance  Topic Date Due  . TETANUS/TDAP  Never done  . PNA vac Low Risk Adult (2 of 2 - PPSV23) 06/26/2018  . INFLUENZA VACCINE  07/11/2020  . DEXA SCAN  Completed  . COVID-19 Vaccine  Completed    Physical Exam: Vitals:   04/09/20 0916  BP: 122/78  Pulse: 77  Temp: (!) 97.3 F (36.3 C)  SpO2: 96%  Weight: 173 lb 3.2 oz (78.6 kg)  Height: 5\' 4"  (1.626 m)   Body mass index is 29.73 kg/m. Physical Exam  Constitutional: Oriented to person, place, and time. Well-developed and well-nourished.  HENT:  Head: Normocephalic.  Mouth/Throat: Oropharynx is clear and moist.  Eyes: Pupils are equal, round, and reactive to light.  Neck: Neck supple.  Cardiovascular: Normal rate and normal heart sounds.  No murmur heard. Pulmonary/Chest:  Effort normal and breath sounds normal. No respiratory distress. No wheezes. She has no rales.  Abdominal: Soft. Bowel sounds are normal. No distension. There is no tenderness. There is no rebound.  Musculoskeletal: Mild edema Bilateral Right Ankle mild swelling not red but Warm Right Wrist swelling also Lymphadenopathy: none Neurological: Alert and oriented to person, place, and time.  Skin: Skin is warm and dry.  Psychiatric: Normal mood and affect. Behavior is normal. Thought content normal.    Labs reviewed: Basic Metabolic Panel: Recent Labs    06/03/19 1426 08/21/19 1520 04/06/20 0730  NA 141 140 142  K 4.9 3.9 4.0  CL 102 101 103  CO2 29 28 30   GLUCOSE 146* 165* 91  BUN 33* 27* 26*  CREATININE 0.98 1.16 1.05*  CALCIUM 9.6 10.1 10.3  MG 2.0 1.8  --   PHOS 2.8  --   --   TSH  --  0.59 3.16   Liver Function Tests: Recent Labs    06/03/19 1426 08/21/19 1520 04/06/20 0730  AST 19 17 14   ALT 21 17 11   ALKPHOS 35* 45  --   BILITOT 0.5 0.5 0.5  PROT 6.5 6.7 7.1  ALBUMIN 4.0 4.0  --    No results for input(s): LIPASE, AMYLASE in the last 8760 hours. No results for input(s): AMMONIA in the last 8760 hours. CBC: Recent Labs    06/03/19 1426 08/21/19 1520 04/06/20 0730  WBC 11.4* 12.7* 11.4*  NEUTROABS 9.7* 10.7* 5,985  HGB 12.9 12.6 13.7  HCT 39.1 39.1 41.3  MCV 100.2* 99.9 93.9  PLT 260.0 342.0 293   Lipid Panel: Recent Labs    04/06/20 0730  CHOL 176  HDL 67  LDLCALC 82  TRIG 171*  CHOLHDL 2.6   No results found for: HGBA1C  Procedures since last visit: No results found.  Assessment/Plan  Rheumatoid arthritis involving multiple sites with positive rheumatoid factor ([place]) Has appointment with Dr. Estanislado Pandy in 3 weeks Will increase her prednisone to 20 mg for 2 weeks to see if that helps her pain.  Should decrease it back to her maintenance dose of 10 mg.  Fibromyalgia Takes tramadol 50 mg 4 times a day. Will let us know when she needs  refill Bilateral lower extremity edema Stable on Lasix BUN/creatinine stable Hyperlipidemia, unspecified hyperlipidemia type LDL 82   Age-related osteoporosis without current pathological fracture On Reclast before per rheumatology Right now on drug holiday  Allergic conjunctivitis and rhinitis, bilateral On Allegra, fluticasone and Singulair I did not hear any wheezing at this time but will start increasing the dose of prednisone will help B12 deficiency She is on monthly injections Will need level checked next visit Degenerative disc disease Continue tramadol every 6 hours  History of cortisol deficiency Is supposed to be on chronic prednisone has seen endocrinology before   Labs/tests ordered:  * No order type specified * Next appt:  07/16/2020  Total time spent in this patient care encounter was  45_  minutes; greater than 50% of the visit spent counseling patient and staff, reviewing records , Labs and coordinating care for problems addressed at this encounter.

## 2020-04-12 ENCOUNTER — Encounter: Payer: Self-pay | Admitting: Internal Medicine

## 2020-04-12 MED ORDER — FLUTICASONE PROPIONATE 50 MCG/ACT NA SUSP: 2.0000 | Freq: Every day | NASAL | 0 refills | Status: DC

## 2020-04-14 ENCOUNTER — Encounter: Payer: Self-pay | Admitting: Internal Medicine

## 2020-04-14 DIAGNOSIS — Z85828 Personal history of other malignant neoplasm of skin: Secondary | ICD-10-CM | POA: Diagnosis not present

## 2020-04-14 DIAGNOSIS — D485 Neoplasm of uncertain behavior of skin: Secondary | ICD-10-CM | POA: Diagnosis not present

## 2020-04-14 DIAGNOSIS — L720 Epidermal cyst: Secondary | ICD-10-CM | POA: Diagnosis not present

## 2020-04-14 DIAGNOSIS — L820 Inflamed seborrheic keratosis: Secondary | ICD-10-CM | POA: Diagnosis not present

## 2020-04-14 DIAGNOSIS — C44329 Squamous cell carcinoma of skin of other parts of face: Secondary | ICD-10-CM | POA: Diagnosis not present

## 2020-04-15 ENCOUNTER — Other Ambulatory Visit: Payer: Self-pay | Admitting: Internal Medicine

## 2020-04-15 MED ORDER — PREDNISONE 20 MG PO TABS: 20.0000 mg | ORAL_TABLET | Freq: Every day | ORAL | 0 refills | Status: DC

## 2020-04-20 NOTE — Progress Notes (Signed)
Office Visit Note  Patient: Eileen Wilkerson             Date of Birth: [birth year]           MRN: [id number]             PCP: [name], MD Referring: No ref. provider found Visit Date: 04/28/2020 Occupation: @GUAROCC @  Subjective:  Rheumatoid Arthritis (Not doing good)   History of Present Illness: Eileen Wilkerson is a 86 y.o. female with rheumatoid arthritis and osteoarthritis overlap.  She states she is having problems doing routine activities.  She has pain and discomfort in multiple joints.  She states she has pain and swelling in her hands and she is having difficulty holding objects.  She has been swelling in her bilateral knee joints and right ankle joint.  She has difficulty walking.  She states her PCP increased her prednisone to 20 mg p.o. daily for short time which helped her ankle.  She could not tolerate Plaquenil due to hair loss.  While she was on methotrexate she had 2 episodes of pneumonia turning into sepsis.  Activities of Daily Living:  Patient reports morning stiffness for 24 hours.   Patient Denies nocturnal pain.  Difficulty dressing/grooming: Reports Difficulty climbing stairs: Reports Difficulty getting out of chair: Reports Difficulty using hands for taps, buttons, cutlery, and/or writing: Reports  Review of Systems  Constitutional: Positive for fatigue. Negative for night sweats, weight gain and weight loss.  HENT: Positive for mouth dryness. Negative for mouth sores, trouble swallowing, trouble swallowing and nose dryness.   Eyes: Positive for dryness. Negative for pain, redness and visual disturbance.  Respiratory: Positive for shortness of breath. Negative for cough and difficulty breathing.   Cardiovascular: Positive for swelling in legs/feet. Negative for chest pain, palpitations, hypertension and irregular heartbeat.  Gastrointestinal: Negative for blood in stool, constipation and diarrhea.  Endocrine: Positive for increased urination.  Negative for heat intolerance and excessive thirst.  Genitourinary: Negative for difficulty urinating and vaginal dryness.  Musculoskeletal: Positive for arthralgias, gait problem, joint pain, joint swelling, muscle weakness and morning stiffness. Negative for myalgias, muscle tenderness and myalgias.  Skin: Negative for color change, rash, hair loss, skin tightness, ulcers and sensitivity to sunlight.  Allergic/Immunologic: Negative for susceptible to infections.  Neurological: Negative for dizziness, memory loss, night sweats and weakness.  Hematological: Positive for bruising/bleeding tendency. Negative for swollen glands.  Psychiatric/Behavioral: Negative for depressed mood and sleep disturbance. The patient is not nervous/anxious.     PMFS History:  Patient Active Problem List   Diagnosis Date Noted  . Hair loss 08/06/2019  . SOB (shortness of breath) on exertion 08/06/2019  . Pure hypercholesterolemia 08/06/2019  . B12 deficiency 03/11/2018  . Idiopathic chronic venous hypertension of both lower extremities with ulcer and inflammation ([place]) 09/18/2017  . Bilateral lower extremity edema 06/30/2017  . Physical deconditioning 05/30/2017  . Immunosuppressed status ([place]) 05/30/2017  . Abnormal chest x-ray 05/17/2017  . Fibromyalgia 12/18/2016  . DJD (degenerative joint disease), cervical 12/18/2016  . Spondylosis of lumbar region without myelopathy or radiculopathy 12/18/2016  . Trigger finger, right middle finger 12/18/2016  . Primary osteoarthritis of both feet 12/18/2016  . Primary osteoarthritis of both knees 12/18/2016  . GERD (gastroesophageal reflux disease) 12/18/2016  . Age-related osteoporosis without current pathological fracture 12/18/2016  . Rheumatoid arthritis ([place]) 12/28/2015  . Long term current use of systemic steroids, for RA 12/28/2015  . Lymphocytosis 11/13/2012    Past Medical History:  Diagnosis Date  . Adrenal failure ([place])   . Arthritis   . Asthma   .  Cataract   . Closed nondisplaced fracture of fifth right metatarsal bone 09/18/2017  . Fibromyalgia 2008  . HCAP (healthcare-associated pneumonia) 02/03/2018  . Osteoporosis   . RA (rheumatoid arthritis) ([place])   . Recurrent upper respiratory infection (URI)   . Sepsis due to urinary tract infection ([place]) 01/18/2018  . Urticaria     Family History  Problem Relation Age of Onset  . Heart attack Maternal Grandmother   . Heart attack Paternal Grandfather   . Breast cancer Mother 70  . Diabetes Father   . Heart disease Father   . Congestive Heart Failure Father 10       Died from  . Allergic rhinitis Neg Hx   . Asthma Neg Hx   . Eczema Neg Hx   . Urticaria Neg Hx    Past Surgical History:  Procedure Laterality Date  . BACK SURGERY    . BILATERAL CARPAL TUNNEL RELEASE  2005   right and left  . CATARACT EXTRACTION, BILATERAL  2004   right and left  . CERVICAL FUSION  2011,2010,2008   2 disks  . HEEL SPUR SURGERY  2004  . lower back fusion  2011   Fusion of 3-4 and 4-5 lower back  . [place]  . ROTATOR CUFF REPAIR  [id number]  . TONSILLECTOMY AND ADENOIDECTOMY  1947  . TOTAL SHOULDER ARTHROPLASTY     Social History   Social History Narrative      Diet:        Do you drink/ eat things with caffeine? Dr. Malachi Bonds 2/ day      Marital status: Widowed                              What year were you married ? 1953      Do you live in a house, apartment,assistred living, condo, trailer, etc.)? Apartment      Is it one or more stories?       How many persons live in your home ? 1      Do you have any pets in your home ?(please list) No      Highest Level of education completed: PhD       Current or past profession: Transport planner, Education officer, museum, Special Educator       Do you exercise?   No                           Type & how often       ADVANCED DIRECTIVES (Please bring copies)      Do you have a living will? Yes      Do you have a DNR form?                        If not, do you want to discuss one? Yes      Do you have signed POA?HPOA forms?                 If so, please bring to your appointment Tes      FUNCTIONAL STATUS- To be completed by Spouse / child / Staff       Do you have difficulty bathing or dressing yourself ? No      Do  you have difficulty preparing food or eating ? No      Do you have difficulty managing your mediation ? No      Do you have difficulty managing your finances ? No      Do you have difficulty affording your medication ? No      Immunization History  Administered Date(s) Administered  . Influenza, High Dose Seasonal PF 08/16/2018, 09/08/2019  . Influenza,inj,Quad PF,6+ Mos 08/11/2016  . Influenza-Unspecified 11/12/2017  . Moderna SARS-COVID-2 Vaccination 12/15/2019, 01/12/2020  . PPD Test 03/01/2018  . Pneumococcal Conjugate-13 06/26/2017  . Zoster Recombinat (Shingrix) 01/28/2019, 09/02/2019     Objective: Vital Signs: BP 122/69 (BP Location: Left Arm, Patient Position: Sitting, Cuff Size: Normal)   Pulse 81   Resp 18   Ht 5\' 4"  (1.626 m)   Wt 173 lb 9.6 oz (78.7 kg)   BMI 29.80 kg/m    Physical Exam Vitals and nursing note reviewed.  Constitutional:      Appearance: She is well-developed.  HENT:     Head: Normocephalic and atraumatic.  Eyes:     Conjunctiva/sclera: Conjunctivae normal.  Cardiovascular:     Rate and Rhythm: Normal rate and regular rhythm.     Heart sounds: Normal heart sounds.  Pulmonary:     Effort: Pulmonary effort is normal.     Breath sounds: Normal breath sounds.  Abdominal:     General: Bowel sounds are normal.     Palpations: Abdomen is soft.  Musculoskeletal:     Cervical back: Normal range of motion.  Lymphadenopathy:     Cervical: No cervical adenopathy.  Skin:    General: Skin is warm and dry.     Capillary Refill: Capillary refill takes less than 2 seconds.  Neurological:     Mental Status: She is alert and oriented to person, place, and time.    Psychiatric:        Behavior: Behavior normal.      Musculoskeletal Exam: C-spine was in good range of motion.  She has thoracic kyphosis.  Shoulder joints, elbow joints in good range of motion.  She has bilateral MCP and PIP synovitis as described below.  She has good range of motion of her hip joints.  She has swelling in her bilateral knee joints and right ankle joint.  No MTP tenderness was noted.  CDAI Exam: CDAI Score: 27.6  Patient Global: 8 mm; Provider Global: 8 mm Swollen: 14 ; Tender: 14  Joint Exam 04/28/2020      Right  Left  MCP 1  Swollen Tender  Swollen Tender  MCP 2  Swollen Tender  Swollen Tender  MCP 3  Swollen Tender  Swollen Tender  IP  Swollen Tender     PIP 2     Swollen Tender  PIP 3  Swollen Tender     PIP 5  Swollen Tender  Swollen Tender  Knee  Swollen Tender  Swollen Tender  Ankle  Swollen Tender        Investigation: No additional findings.  Imaging: No results found.  Recent Labs: Lab Results  Component Value Date   WBC 11.4 (H) 04/06/2020   HGB 13.7 04/06/2020   PLT 293 04/06/2020   NA 142 04/06/2020   K 4.0 04/06/2020   CL 103 04/06/2020   CO2 30 04/06/2020   GLUCOSE 91 04/06/2020   BUN 26 (H) 04/06/2020   CREATININE 1.05 (H) 04/06/2020   BILITOT 0.5 04/06/2020   ALKPHOS 45 08/21/2019  AST 14 04/06/2020   ALT 11 04/06/2020   PROT 7.1 04/06/2020   ALBUMIN 4.0 08/21/2019   CALCIUM 10.3 04/06/2020   GFRAA 56 (L) 04/06/2020    Speciality Comments: PLQ eye exam:10/22/18 Normal. [place] Follow up in 6 months. Prior therapy: MTX (recurrent infections) Therapy Contraindications: Anti-TNF's due to CHF  Procedures:  No procedures performed Allergies: Adhesive [tape], Celebrex [celecoxib], Ciprofibrate, Cymbalta [duloxetine hcl], Gabitril [tiagabine], Keflex [cephalexin], Lyrica [pregabalin], Neurontin [gabapentin], Nexium [esomeprazole], Nsaids, Penicillins, Shrimp [shellfish allergy], Sulfa antibiotics, Azactam  [aztreonam], Azelastine hcl, Ciprofloxacin, Claritin [loratadine], Methotrexate derivatives, Nasacort [triamcinolone], Olopatadine, Sulfamethizole, Zantac [ranitidine hcl], and Claritin-d 12 hour [loratadine-pseudoephedrine er]   Assessment / Plan:     Visit Diagnoses: Rheumatoid arthritis involving multiple sites with positive rheumatoid factor (HCC)-she is having a flare with severe joint pain and swelling in multiple joints.  She has been on prednisone 10 mg for a long time due to adrenal insufficiency.  It is not controlling her symptoms.  Patient states difficult for her to do routine activities.  [place] was discontinued in the past due to hair loss.  Patient does not want to go back on Plaquenil.  Methotrexate was discontinued due to pneumonia and sepsis.  Different treatment options were discussed.  We had detailed discussion that all the medications which we can use for the treatment of rheumatoid arthritis will suppress her immune system and will increase the risk of infection and sepsis.  She does understand the situation.  She believes that since she has been living at the friend's home her risk of infection is gone down.  And she would like to try an immunosuppressant agent.  We discussed the option of adding leflunomide.  I will try leflunomide 10 mg p.o. daily at the lower dose.  She was in agreement.  Indications side effects contraindications were reviewed handout was given and consent was taken.  We will call in the prescription today.  Then we will check labs in 2 weeks and then a month.  She has been strongly advised to stop the medication for any signs of infection.  Medication counseling:   Baseline Immunosuppressant Therapy Labs   Lab Results  Component Value Date   HIV Non Reactive 02/03/2018   HIV Non Reactive 04/26/2017    Immunoglobulin Electrophoresis Latest Ref Rng & Units 08/08/2018  IgG 700 - 1,600 mg/dL 1,186  IgM 26 - 217 mg/dL 60    December 01, 2009 hepatitis  panel negative, SPEP negative August 31, 2015 TB Gold negative  Patient was counseled on the purpose, proper use, and adverse effects of leflunomide including risk of infection, nausea/diarrhea/weight loss, increase in blood pressure, rash, hair loss, tingling in the hands and feet, and signs and symptoms of interstitial lung disease.   Also counseled on Black Box warning of liver injury and importance of avoiding alcohol while on therapy. Discussed that there is the possibility of an increased risk of malignancy but it is not well understood if this increased risk is due to the medication or the disease state.  Counseled patient to avoid live vaccines. Recommend annual influenza, Pneumovax 23, Prevnar 13, and Shingrix as indicated.   Discussed the importance of frequent monitoring of liver function and blood count.  Standing orders placed. Provided patient with educational materials on leflunomide and answered all questions.  Patient consented to [place] use, and consent will be uploaded into the media tab.   Patient dose will be 10 mg p.o. daily.\.  Prescription pending lab results and/or  insurance approval.  High risk medication use - Prednisone 10 mg daily.  She discontinued PLQ in September 2020 due to hair loss. She previously discontinued MTX in 12/2017 due to frequent infections.   Primary osteoarthritis of both hands-she has decreased grip strength.  Primary osteoarthritis of both knees - Dr. Sharol Given  Primary osteoarthritis of both feet-she has right ankle joint swelling and chronic pain.  Age-related osteoporosis without current pathological fracture - Last bone density from December 27, 2017 showed a T score of -1.7.  She was treated with Reclast in the past.  She is on a drug holiday currently.  She has been getting her DEXA scan through her PCP.  I have advised her to get repeat DEXA now.  Fibromyalgia-she continues to have some generalized pain and discomfort.  DDD (degenerative disc  disease), cervical-she has limited range of motion without much discomfort.  Adrenal insufficiency (HCC)-she is on long-term prednisone.  History of fatigue  History of CHF (congestive heart failure)  Orders: No orders of the defined types were placed in this encounter.  Meds ordered this encounter  Medications  . leflunomide (ARAVA) 10 MG tablet    Sig: Take 1 tablet (10 mg total) by mouth daily.    Dispense:  30 tablet    Refill:  0    Face-to-face time spent with patient was 30 minutes. Greater than 50% of time was spent in counseling and coordination of care.  Follow-Up Instructions: Return in about 4 weeks (around 05/26/2020) for Rheumatoid arthritis.   Bo Merino, MD  Note - This record has been created using Editor, commissioning.  Chart creation errors have been sought, but may not always  have been located. Such creation errors do not reflect on  the standard of medical care.

## 2020-04-28 ENCOUNTER — Other Ambulatory Visit: Payer: Self-pay

## 2020-04-28 ENCOUNTER — Encounter: Payer: Self-pay | Admitting: Rheumatology

## 2020-04-28 ENCOUNTER — Ambulatory Visit: Payer: Medicare PPO | Admitting: Rheumatology

## 2020-04-28 VITALS — BP 122/69 | HR 81 | Resp 18 | Ht 64.0 in | Wt 173.6 lb

## 2020-04-28 DIAGNOSIS — M19041 Primary osteoarthritis, right hand: Secondary | ICD-10-CM | POA: Diagnosis not present

## 2020-04-28 DIAGNOSIS — E2740 Unspecified adrenocortical insufficiency: Secondary | ICD-10-CM

## 2020-04-28 DIAGNOSIS — M797 Fibromyalgia: Secondary | ICD-10-CM

## 2020-04-28 DIAGNOSIS — M170 Bilateral primary osteoarthritis of knee: Secondary | ICD-10-CM

## 2020-04-28 DIAGNOSIS — Z8679 Personal history of other diseases of the circulatory system: Secondary | ICD-10-CM

## 2020-04-28 DIAGNOSIS — M19071 Primary osteoarthritis, right ankle and foot: Secondary | ICD-10-CM

## 2020-04-28 DIAGNOSIS — M0579 Rheumatoid arthritis with rheumatoid factor of multiple sites without organ or systems involvement: Secondary | ICD-10-CM | POA: Diagnosis not present

## 2020-04-28 DIAGNOSIS — Z87898 Personal history of other specified conditions: Secondary | ICD-10-CM

## 2020-04-28 DIAGNOSIS — M810 Age-related osteoporosis without current pathological fracture: Secondary | ICD-10-CM

## 2020-04-28 DIAGNOSIS — Z79899 Other long term (current) drug therapy: Secondary | ICD-10-CM

## 2020-04-28 DIAGNOSIS — M5030 Other cervical disc degeneration, unspecified cervical region: Secondary | ICD-10-CM | POA: Diagnosis not present

## 2020-04-28 DIAGNOSIS — M19042 Primary osteoarthritis, left hand: Secondary | ICD-10-CM

## 2020-04-28 DIAGNOSIS — M19072 Primary osteoarthritis, left ankle and foot: Secondary | ICD-10-CM

## 2020-04-28 MED ORDER — LEFLUNOMIDE 10 MG PO TABS
10.0000 mg | ORAL_TABLET | Freq: Every day | ORAL | 0 refills | Status: DC
Start: 2020-04-28 — End: 2020-05-24

## 2020-04-28 NOTE — Patient Instructions (Signed)
Standing Labs We placed an order today for your standing lab work.    Please come back and get your standing labs in 2 weeks then 1 month   We have open lab daily Monday through Thursday from 8:30-12:30 PM and 1:30-4:30 PM and Friday from 8:30-12:30 PM and 1:30-4:00 PM at the office of Dr. Bo Merino.   You may experience shorter wait times on Monday and Friday afternoons. The office is located at [address] [place], [place], [place] [address] No appointment is necessary.   Labs are drawn by [facility].  You may receive a bill from [facility] for your lab work.  If you wish to have your labs drawn at another location, please call the office 24 hours in advance to send orders.  If you have any questions regarding directions or hours of operation,  please call [phone].   Just as a reminder please drink plenty of water prior to coming for your lab work. Thanks!    Leflunomide tablets What is this medicine? LEFLUNOMIDE (le FLOO na mide) is for rheumatoid arthritis. This medicine may be used for other purposes; ask your health care provider or pharmacist if you have questions. COMMON BRAND NAME(S): Arava What should I tell my health care provider before I take this medicine? They need to know if you have any of these conditions:  diabetes  have a fever or infection  high blood pressure  immune system problems  kidney disease  liver disease  low blood cell counts, like low white cell, platelet, or red cell counts  lung or breathing disease, like asthma  recently received or scheduled to receive a vaccine  receiving treatment for cancer  skin conditions or sensitivity  tingling of the fingers or toes, or other nerve disorder  tuberculosis  an unusual or allergic reaction to leflunomide, teriflunomide, other medicines, food, dyes, or preservatives  pregnant or trying to get pregnant  breast-feeding How should I use this medicine? Take this medicine by  mouth with a full glass of water. Follow the directions on the prescription label. Take your medicine at regular intervals. Do not take your medicine more often than directed. Do not stop taking except on your doctor's advice. Talk to your pediatrician regarding the use of this medicine in children. Special care may be needed. Overdosage: If you think you have taken too much of this medicine contact a poison control center or emergency room at once. NOTE: This medicine is only for you. Do not share this medicine with others. What if I miss a dose? If you miss a dose, take it as soon as you can. If it is almost time for your next dose, take only that dose. Do not take double or extra doses. What may interact with this medicine? Do not take this medicine with any of the following medications:  teriflunomide This medicine may also interact with the following medications:  alosetron  birth control pills  caffeine  cefaclor  certain medicines for diabetes like nateglinide, repaglinide, rosiglitazone, pioglitazone  certain medicines for high cholesterol like atorvastatin, pravastatin, rosuvastatin, simvastatin  charcoal  cholestyramine  ciprofloxacin  duloxetine  furosemide  ketoprofen  live virus vaccines  medicines that increase your risk for infection  methotrexate  mitoxantrone  paclitaxel  penicillin  theophylline  tizanidine  warfarin This list may not describe all possible interactions. Give your health care provider a list of all the medicines, herbs, non-prescription drugs, or dietary supplements you use. Also tell them if you  smoke, drink alcohol, or use illegal drugs. Some items may interact with your medicine. What should I watch for while using this medicine? Visit your health care provider for regular checks on your progress. Tell your doctor or health care provider if your symptoms do not start to get better or if they get worse. You may need blood work  done while you are taking this medicine. This medicine may cause serious skin reactions. They can happen weeks to months after starting the medicine. Contact your health care provider right away if you notice fevers or flu-like symptoms with a rash. The rash may be red or purple and then turn into blisters or peeling of the skin. Or, you might notice a red rash with swelling of the face, lips or lymph nodes in your neck or under your arms. This medicine may stay in your body for up to 2 years after your last dose. Tell your doctor about any unusual side effects or symptoms. A medicine can be given to help lower your blood levels of this medicine more quickly. Women must use effective birth control with this medicine. There is a potential for serious side effects to an unborn child. Do not become pregnant while taking this medicine. Inform your doctor if you wish to become pregnant. This medicine remains in your blood after you stop taking it. You must continue using effective birth control until the blood levels have been checked and they are low enough. A medicine can be given to help lower your blood levels of this medicine more quickly. Immediately talk to your doctor if you think you may be pregnant. You may need a pregnancy test. Talk to your health care provider or pharmacist for more information. You should not receive certain vaccines during your treatment and for a certain time after your treatment with this medication ends. Talk to your health care provider for more information. What side effects may I notice from receiving this medicine? Side effects that you should report to your doctor or health care professional as soon as possible:  allergic reactions like skin rash, itching or hives, swelling of the face, lips, or tongue  breathing problems  cough  increased blood pressure  low blood counts - this medicine may decrease the number of white blood cells and platelets. You may be at  increased risk for infections and bleeding.  pain, tingling, numbness in the hands or feet  rash, fever, and swollen lymph nodes  redness, blistering, peeing or loosening of the skin, including inside the mouth  signs of decreased platelets or bleeding - bruising, pinpoint red spots on the skin, black, tarry stools, blood in urine  signs of infection - fever or chills, cough, sore throat, pain or trouble passing urine  signs and symptoms of liver injury like dark yellow or brown urine; general ill feeling or flu-like symptoms; light-colored stools; loss of appetite; nausea; right upper belly pain; unusually weak or tired; yellowing of the eyes or skin  trouble passing urine or change in the amount of urine  vomiting Side effects that usually do not require medical attention (report to your doctor or health care professional if they continue or are bothersome):  diarrhea  hair thinning or loss  headache  nausea  tiredness This list may not describe all possible side effects. Call your doctor for medical advice about side effects. You may report side effects to FDA at 1-800-FDA-1088. Where should I keep my medicine? Keep out of the reach of  children. Store at room temperature between 15 and 30 degrees C (59 and 86 degrees F). Protect from moisture and light. Throw away any unused medicine after the expiration date. NOTE: This sheet is a summary. It may not cover all possible information. If you have questions about this medicine, talk to your doctor, pharmacist, or health care provider.  2020 Elsevier/Gold Standard (2019-02-28 15:06:48)

## 2020-04-30 ENCOUNTER — Encounter: Payer: Self-pay | Admitting: Internal Medicine

## 2020-05-03 ENCOUNTER — Other Ambulatory Visit: Payer: Self-pay | Admitting: *Deleted

## 2020-05-03 DIAGNOSIS — M797 Fibromyalgia: Secondary | ICD-10-CM

## 2020-05-03 DIAGNOSIS — M47816 Spondylosis without myelopathy or radiculopathy, lumbar region: Secondary | ICD-10-CM

## 2020-05-03 DIAGNOSIS — M0579 Rheumatoid arthritis with rheumatoid factor of multiple sites without organ or systems involvement: Secondary | ICD-10-CM

## 2020-05-03 MED ORDER — TRAMADOL HCL 50 MG PO TABS: ORAL_TABLET | ORAL | 0 refills | Status: DC

## 2020-05-03 MED ORDER — PREDNISONE 10 MG PO TABS: 10.0000 mg | ORAL_TABLET | Freq: Every day | ORAL | 0 refills | Status: DC

## 2020-05-03 NOTE — Telephone Encounter (Signed)
Patient requested refills to be sent to [facility].  Pended Rx's and sent to Dr. Lyndel Safe for approval.

## 2020-05-13 ENCOUNTER — Other Ambulatory Visit: Payer: Self-pay

## 2020-05-13 DIAGNOSIS — Z79899 Other long term (current) drug therapy: Secondary | ICD-10-CM

## 2020-05-13 NOTE — Progress Notes (Signed)
Office Visit Note  Patient: Eileen Wilkerson             Date of Birth: Oct 03, 1932           MRN: [id number]             PCP: [name], MD Referring: [name], MD Visit Date: 05/27/2020 Occupation: @GUAROCC @  Subjective:  Rheumatoid Arthritis (Not doing good, severe pain, difficulty walking)   History of Present Illness: Eileen Wilkerson is a 86 y.o. female with history of seropositive rheumatoid arthritis.  She states she has been experiencing increased pain and discomfort in almost all of her joints.  She took a long road trip which caused lot of bumping in her joints.  She has been experiencing pain in her bilateral hands and her bilateral knee joints.  She states she is having difficulty walking.  She feels that her knees are swollen.  Activities of Daily Living:  Patient reports morning stiffness for 2 hours.   Patient Denies nocturnal pain.  Difficulty dressing/grooming: Reports Difficulty climbing stairs: Reports Difficulty getting out of chair: Reports Difficulty using hands for taps, buttons, cutlery, and/or writing: Reports  Review of Systems  Constitutional: Positive for fatigue. Negative for night sweats, weight gain and weight loss.  HENT: Positive for mouth dryness. Negative for mouth sores, trouble swallowing, trouble swallowing and nose dryness.   Eyes: Positive for dryness. Negative for pain, redness and visual disturbance.  Respiratory: Negative for cough, shortness of breath and difficulty breathing.   Cardiovascular: Negative for chest pain, palpitations, hypertension, irregular heartbeat and swelling in legs/feet.  Gastrointestinal: Negative for blood in stool, constipation and diarrhea.  Endocrine: Negative for heat intolerance, excessive thirst and increased urination.  Genitourinary: Negative for difficulty urinating and vaginal dryness.  Musculoskeletal: Positive for arthralgias, gait problem, joint pain, joint swelling, morning stiffness  and muscle tenderness. Negative for myalgias, muscle weakness and myalgias.  Skin: Negative for color change, rash, hair loss, skin tightness, ulcers and sensitivity to sunlight.  Allergic/Immunologic: Negative for susceptible to infections.  Neurological: Negative for dizziness, numbness, memory loss, night sweats and weakness.  Hematological: Negative for bruising/bleeding tendency and swollen glands.  Psychiatric/Behavioral: Negative for depressed mood and sleep disturbance. The patient is not nervous/anxious.     PMFS History:  Patient Active Problem List   Diagnosis Date Noted  . Hair loss 08/06/2019  . SOB (shortness of breath) on exertion 08/06/2019  . Pure hypercholesterolemia 08/06/2019  . B12 deficiency 03/11/2018  . Idiopathic chronic venous hypertension of both lower extremities with ulcer and inflammation ([place]) 09/18/2017  . Bilateral lower extremity edema 06/30/2017  . Physical deconditioning 05/30/2017  . Immunosuppressed status ([place]) 05/30/2017  . Abnormal chest x-ray 05/17/2017  . Fibromyalgia 12/18/2016  . DJD (degenerative joint disease), cervical 12/18/2016  . Spondylosis of lumbar region without myelopathy or radiculopathy 12/18/2016  . Trigger finger, right middle finger 12/18/2016  . Primary osteoarthritis of both feet 12/18/2016  . Primary osteoarthritis of both knees 12/18/2016  . GERD (gastroesophageal reflux disease) 12/18/2016  . Age-related osteoporosis without current pathological fracture 12/18/2016  . Rheumatoid arthritis ([place]) 12/28/2015  . Long term current use of systemic steroids, for RA 12/28/2015  . Lymphocytosis 11/13/2012    Past Medical History:  Diagnosis Date  . Adrenal failure ([place])   . Arthritis   . Asthma   . Cancer ([place])   . Cataract   . Closed nondisplaced fracture of fifth right metatarsal bone 09/18/2017  . Fibromyalgia 2008  .  HCAP (healthcare-associated pneumonia) 02/03/2018  . Osteoporosis   . RA (rheumatoid arthritis)  ([place])   . Recurrent upper respiratory infection (URI)   . Sepsis due to urinary tract infection ([place]) 01/18/2018  . Urticaria     Family History  Problem Relation Age of Onset  . Heart attack Maternal Grandmother   . Heart attack Paternal Grandfather   . Breast cancer Mother 87  . Diabetes Father   . Heart disease Father   . Congestive Heart Failure Father 50       Died from  . Allergic rhinitis Neg Hx   . Asthma Neg Hx   . Eczema Neg Hx   . Urticaria Neg Hx    Past Surgical History:  Procedure Laterality Date  . BACK SURGERY    . BILATERAL CARPAL TUNNEL RELEASE  2005   right and left  . CATARACT EXTRACTION, BILATERAL  2004   right and left  . CERVICAL FUSION  2011,2010,2008   2 disks  . HEEL SPUR SURGERY  2004  . lower back fusion  2011   Fusion of 3-4 and 4-5 lower back  . [place]  . ROTATOR CUFF REPAIR  [id number]  . SQUAMOUS CELL CARCINOMA EXCISION    . TONSILLECTOMY AND ADENOIDECTOMY  1947  . TOTAL SHOULDER ARTHROPLASTY     Social History   Social History Narrative      Diet:        Do you drink/ eat things with caffeine? Dr. Malachi Bonds 2/ day      Marital status: Widowed                              What year were you married ? 1953      Do you live in a house, apartment,assistred living, condo, trailer, etc.)? Apartment      Is it one or more stories?       How many persons live in your home ? 1      Do you have any pets in your home ?(please list) No      Highest Level of education completed: PhD       Current or past profession: Transport planner, Education officer, museum, Special Educator       Do you exercise?   No                           Type & how often       ADVANCED DIRECTIVES (Please bring copies)      Do you have a living will? Yes      Do you have a DNR form?                       If not, do you want to discuss one? Yes      Do you have signed POA?HPOA forms?                 If so, please bring to your appointment Tes       FUNCTIONAL STATUS- To be completed by Spouse / child / Staff       Do you have difficulty bathing or dressing yourself ? No      Do you have difficulty preparing food or eating ? No      Do you have difficulty managing your mediation ? No      Do  you have difficulty managing your finances ? No      Do you have difficulty affording your medication ? No      Immunization History  Administered Date(s) Administered  . Influenza, High Dose Seasonal PF 08/16/2018, 09/08/2019  . Influenza,inj,Quad PF,6+ Mos 08/11/2016  . Influenza-Unspecified 11/12/2017  . Moderna SARS-COVID-2 Vaccination 12/15/2019, 01/12/2020  . PPD Test 03/01/2018  . Pneumococcal Conjugate-13 06/26/2017  . Zoster Recombinat (Shingrix) 01/28/2019, 09/02/2019     Objective: Vital Signs: BP (!) 148/85 (BP Location: Left Arm, Patient Position: Sitting, Cuff Size: Normal)   Pulse (!) 118   Resp 20   Ht 5' 4.5" (1.638 m)   Wt 174 lb 4.8 oz (79.1 kg)   BMI 29.46 kg/m    Physical Exam Vitals and nursing note reviewed.  Constitutional:      Appearance: She is well-developed.  HENT:     Head: Normocephalic and atraumatic.  Eyes:     Conjunctiva/sclera: Conjunctivae normal.  Cardiovascular:     Rate and Rhythm: Normal rate and regular rhythm.     Heart sounds: Normal heart sounds.  Pulmonary:     Effort: Pulmonary effort is normal.     Breath sounds: Normal breath sounds.  Abdominal:     General: Bowel sounds are normal.     Palpations: Abdomen is soft.  Musculoskeletal:     Cervical back: Normal range of motion.  Lymphadenopathy:     Cervical: No cervical adenopathy.  Skin:    General: Skin is warm and dry.     Capillary Refill: Capillary refill takes less than 2 seconds.  Neurological:     Mental Status: She is alert and oriented to person, place, and time.  Psychiatric:        Behavior: Behavior normal.      Musculoskeletal Exam: C-spine with limited range of motion.  Shoulder joints, elbow joints,  wrist joints with good range of motion.  She had DIP and PIP thickening but no synovitis was noted.  Hip joints, knee joints, ankles, MTPs and PIPs with good range of motion with no synovitis.  CDAI Exam: CDAI Score: - Patient Global: -; Provider Global: - Swollen: -; Tender: - Joint Exam 05/27/2020   No joint exam has been documented for this visit   There is currently no information documented on the homunculus. Go to the Rheumatology activity and complete the homunculus joint exam.  Investigation: No additional findings.  Imaging: No results found.  Recent Labs: Lab Results  Component Value Date   WBC 13.7 (H) 05/13/2020   HGB 14.2 05/13/2020   PLT 306 05/13/2020   NA 145 05/13/2020   K 4.8 05/13/2020   CL 103 05/13/2020   CO2 32 05/13/2020   GLUCOSE 89 05/13/2020   BUN 28 (H) 05/13/2020   CREATININE 1.19 (H) 05/13/2020   BILITOT 0.6 05/13/2020   ALKPHOS 45 08/21/2019   AST 17 05/13/2020   ALT 13 05/13/2020   PROT 7.4 05/13/2020   ALBUMIN 4.0 08/21/2019   CALCIUM 10.8 (H) 05/13/2020   GFRAA 48 (L) 05/13/2020    Speciality Comments: PLQ eye exam:10/22/18 Normal. [facility] Follow up in 6 months. Prior therapy: MTX (recurrent infections) Therapy Contraindications: Anti-TNF's due to CHF  Procedures:  No procedures performed Allergies: Adhesive [tape], Celebrex [celecoxib], Ciprofibrate, Cymbalta [duloxetine hcl], Gabitril [tiagabine], Keflex [cephalexin], Lyrica [pregabalin], Neurontin [gabapentin], Nexium [esomeprazole], Nsaids, Penicillins, Shrimp [shellfish allergy], Sulfa antibiotics, Azactam [aztreonam], Azelastine hcl, Ciprofloxacin, Claritin [loratadine], Methotrexate derivatives, Nasacort [triamcinolone], Olopatadine, Sulfamethizole, Zantac [ranitidine hcl],  and Claritin-d 12 hour [loratadine-pseudoephedrine er]   Assessment / Plan:     Visit Diagnoses: Rheumatoid arthritis involving multiple sites with positive rheumatoid factor (HCC) -  leflunomide 10 mg p.o. daily.  Patient has noticed improvement in her symptoms since she has been on leflunomide.  She had no synovitis on examination.  Although she has been experiencing generalized pain and discomfort.  High risk medication use - Prednisone 10 mg daily for adrenal insufficiency..  She discontinued PLQ in September 2020 due to hair loss. She previously discontinued MTX in 12/2017 due to frequent infections.  She has been tolerating leflunomide well.  Her labs were normal in June.  We will continue to monitor labs.  Primary osteoarthritis of both hands -she complained of increased hand discomfort.  Plan: XR Hand 2 View Right, XR Hand 2 View Left.  X-ray showed overlap of rheumatoid arthritis and rheumatoid arthritis.  Primary osteoarthritis of both knees -she is followed by Dr. Sharol Given.  She has no warmth swelling or effusion in her knees.  Primary osteoarthritis of both feet -she has discomfort in her feet but no synovitis was noted.  Plan: XR Foot 2 Views Right, XR Foot 2 Views Left.  X-rays were consistent with osteoarthritis.  Age-related osteoporosis without current pathological fracture - Last bone density from December 27, 2017 showed a T score of -1.7.  She was treated with Reclast in the past.  She is on a drug holiday currently.  Fibromyalgia-she has generalized pain and discomfort from fibromyalgia.  DDD (degenerative disc disease), cervical  History of fatigue  Adrenal insufficiency (HCC)-she is on chronic prednisone therapy 10 mg p.o. daily.  History of CHF (congestive heart failure)  Orders: Orders Placed This Encounter  Procedures  . XR Hand 2 View Right  . XR Hand 2 View Left  . XR Foot 2 Views Right  . XR Foot 2 Views Left   No orders of the defined types were placed in this encounter.     Follow-Up Instructions: Return in about 3 months (around 08/27/2020) for Rheumatoid arthritis, Osteoarthritis.   Bo Merino, MD  Note - This record has  been created using Editor, commissioning.  Chart creation errors have been sought, but may not always  have been located. Such creation errors do not reflect on  the standard of medical care.

## 2020-05-14 LAB — CBC WITH DIFFERENTIAL/PLATELET
Absolute Monocytes: 1110 cells/uL — ABNORMAL HIGH (ref 200–950)
Basophils Absolute: 82 cells/uL (ref 0–200)
Basophils Relative: 0.6 %
Eosinophils Absolute: 137 cells/uL (ref 15–500)
Eosinophils Relative: 1 %
HCT: 43.9 % (ref 35.0–45.0)
Hemoglobin: 14.2 g/dL (ref 11.7–15.5)
Lymphs Abs: 2384 cells/uL (ref 850–3900)
MCH: 31.2 pg (ref 27.0–33.0)
MCHC: 32.3 g/dL (ref 32.0–36.0)
MCV: 96.5 fL (ref 80.0–100.0)
MPV: 10.4 fL (ref 7.5–12.5)
Monocytes Relative: 8.1 %
Neutro Abs: 9987 cells/uL — ABNORMAL HIGH (ref 1500–7800)
Neutrophils Relative %: 72.9 %
Platelets: 306 10*3/uL (ref 140–400)
RBC: 4.55 10*6/uL (ref 3.80–5.10)
RDW: 12.5 % (ref 11.0–15.0)
Total Lymphocyte: 17.4 %
WBC: 13.7 10*3/uL — ABNORMAL HIGH (ref 3.8–10.8)

## 2020-05-14 LAB — COMPLETE METABOLIC PANEL WITH GFR
AG Ratio: 1.5 (calc) (ref 1.0–2.5)
ALT: 13 U/L (ref 6–29)
AST: 17 U/L (ref 10–35)
Albumin: 4.4 g/dL (ref 3.6–5.1)
Alkaline phosphatase (APISO): 50 U/L (ref 37–153)
BUN/Creatinine Ratio: 24 (calc) — ABNORMAL HIGH (ref 6–22)
BUN: 28 mg/dL — ABNORMAL HIGH (ref 7–25)
CO2: 32 mmol/L (ref 20–32)
Calcium: 10.8 mg/dL — ABNORMAL HIGH (ref 8.6–10.4)
Chloride: 103 mmol/L (ref 98–110)
Creat: 1.19 mg/dL — ABNORMAL HIGH (ref 0.60–0.88)
GFR, Est African American: 48 mL/min/{1.73_m2} — ABNORMAL LOW (ref >=60)
GFR, Est Non African American: 42 mL/min/{1.73_m2} — ABNORMAL LOW (ref >=60)
Globulin: 3 g/dL (calc) (ref 1.9–3.7)
Glucose, Bld: 89 mg/dL (ref 65–99)
Potassium: 4.8 mmol/L (ref 3.5–5.3)
Sodium: 145 mmol/L (ref 135–146)
Total Bilirubin: 0.6 mg/dL (ref 0.2–1.2)
Total Protein: 7.4 g/dL (ref 6.1–8.1)

## 2020-05-14 NOTE — Progress Notes (Signed)
GFR is low, most likely due to Lasix use.  Calcium is high.  Please advise patient to cut back on calcium supplement.  She has chronic elevation of white cell count.

## 2020-05-16 ENCOUNTER — Encounter: Payer: Self-pay | Admitting: Internal Medicine

## 2020-05-18 ENCOUNTER — Other Ambulatory Visit: Payer: Self-pay | Admitting: Internal Medicine

## 2020-05-18 DIAGNOSIS — M0579 Rheumatoid arthritis with rheumatoid factor of multiple sites without organ or systems involvement: Secondary | ICD-10-CM

## 2020-05-18 DIAGNOSIS — M542 Cervicalgia: Secondary | ICD-10-CM

## 2020-05-19 ENCOUNTER — Encounter: Payer: Self-pay | Admitting: Internal Medicine

## 2020-05-19 ENCOUNTER — Encounter: Payer: Self-pay | Admitting: Rheumatology

## 2020-05-19 DIAGNOSIS — H1013 Acute atopic conjunctivitis, bilateral: Secondary | ICD-10-CM

## 2020-05-19 DIAGNOSIS — J309 Allergic rhinitis, unspecified: Secondary | ICD-10-CM

## 2020-05-19 DIAGNOSIS — M0579 Rheumatoid arthritis with rheumatoid factor of multiple sites without organ or systems involvement: Secondary | ICD-10-CM

## 2020-05-20 MED ORDER — DICLOFENAC SODIUM 1 % EX GEL: CUTANEOUS | 0 refills | Status: DC

## 2020-05-20 MED ORDER — MONTELUKAST SODIUM 10 MG PO TABS: 10.0000 mg | ORAL_TABLET | Freq: Every day | ORAL | 1 refills | Status: DC

## 2020-05-20 NOTE — Telephone Encounter (Signed)
Last Visit: 04/28/2020 Next Visit: 05/27/2020  Okay to refill per Dr. Estanislado Pandy

## 2020-05-20 NOTE — Addendum Note (Signed)
Addended by: Carole Binning on: 05/20/2020 10:19 AM   Modules accepted: Orders

## 2020-05-23 ENCOUNTER — Other Ambulatory Visit: Payer: Self-pay | Admitting: Rheumatology

## 2020-05-24 NOTE — Telephone Encounter (Signed)
Last Visit: 04/28/2020 Next Visit: 05/27/2020 Labs: 05/13/2020 GFR is low, most likely due to Lasix use. Calcium is high. chronic elevation of white cell count.  Current Dose per office note on 04/28/2020: leflunomide 10 mg p.o. daily   Okay to refill per Dr. Estanislado Pandy

## 2020-05-26 ENCOUNTER — Encounter: Payer: Self-pay | Admitting: Internal Medicine

## 2020-05-26 DIAGNOSIS — E7800 Pure hypercholesterolemia, unspecified: Secondary | ICD-10-CM

## 2020-05-27 ENCOUNTER — Ambulatory Visit: Payer: Medicare PPO | Admitting: Rheumatology

## 2020-05-27 ENCOUNTER — Other Ambulatory Visit: Payer: Self-pay

## 2020-05-27 ENCOUNTER — Encounter: Payer: Self-pay | Admitting: Rheumatology

## 2020-05-27 ENCOUNTER — Ambulatory Visit: Payer: Self-pay

## 2020-05-27 VITALS — BP 148/85 | HR 118 | Resp 20 | Ht 64.5 in | Wt 174.3 lb

## 2020-05-27 DIAGNOSIS — M797 Fibromyalgia: Secondary | ICD-10-CM

## 2020-05-27 DIAGNOSIS — M19072 Primary osteoarthritis, left ankle and foot: Secondary | ICD-10-CM

## 2020-05-27 DIAGNOSIS — M19042 Primary osteoarthritis, left hand: Secondary | ICD-10-CM

## 2020-05-27 DIAGNOSIS — E2740 Unspecified adrenocortical insufficiency: Secondary | ICD-10-CM

## 2020-05-27 DIAGNOSIS — M5030 Other cervical disc degeneration, unspecified cervical region: Secondary | ICD-10-CM

## 2020-05-27 DIAGNOSIS — M170 Bilateral primary osteoarthritis of knee: Secondary | ICD-10-CM | POA: Diagnosis not present

## 2020-05-27 DIAGNOSIS — M19041 Primary osteoarthritis, right hand: Secondary | ICD-10-CM | POA: Diagnosis not present

## 2020-05-27 DIAGNOSIS — Z79899 Other long term (current) drug therapy: Secondary | ICD-10-CM | POA: Diagnosis not present

## 2020-05-27 DIAGNOSIS — Z8679 Personal history of other diseases of the circulatory system: Secondary | ICD-10-CM

## 2020-05-27 DIAGNOSIS — Z87898 Personal history of other specified conditions: Secondary | ICD-10-CM

## 2020-05-27 DIAGNOSIS — M0579 Rheumatoid arthritis with rheumatoid factor of multiple sites without organ or systems involvement: Secondary | ICD-10-CM | POA: Diagnosis not present

## 2020-05-27 DIAGNOSIS — M19071 Primary osteoarthritis, right ankle and foot: Secondary | ICD-10-CM | POA: Diagnosis not present

## 2020-05-27 DIAGNOSIS — M810 Age-related osteoporosis without current pathological fracture: Secondary | ICD-10-CM | POA: Diagnosis not present

## 2020-05-27 MED ORDER — ATORVASTATIN CALCIUM 20 MG PO TABS: 20.0000 mg | ORAL_TABLET | Freq: Every day | ORAL | 3 refills | Status: DC

## 2020-05-27 NOTE — Patient Instructions (Signed)
Standing Labs We placed an order today for your standing lab work.   Please have your standing labs drawn in September  If possible, please have your labs drawn 2 weeks prior to your appointment so that the provider can discuss your results at your appointment.  We have open lab daily Monday through Thursday from 8:30-12:30 PM and 1:30-4:30 PM and Friday from 8:30-12:30 PM and 1:30-4:00 PM at the office of Dr. Bo Merino, [facility].   You may experience shorter wait times on Monday and Friday afternoons. The office is located at [address], [place], [place], [place] [address] No appointment is necessary.   Labs are drawn by [facility].  You may receive a bill from [facility] for your lab work.  If you wish to have your labs drawn at another location, please call the office 24 hours in advance to send orders.  If you have any questions regarding directions or hours of operation,  please call [phone].   As a reminder, please drink plenty of water prior to coming for your lab work. Thanks!

## 2020-05-30 ENCOUNTER — Other Ambulatory Visit: Payer: Self-pay | Admitting: Internal Medicine

## 2020-05-31 NOTE — Telephone Encounter (Signed)
In previous note states prednisone is a high risk medication.

## 2020-06-02 ENCOUNTER — Other Ambulatory Visit: Payer: Self-pay | Admitting: Internal Medicine

## 2020-06-02 DIAGNOSIS — M0579 Rheumatoid arthritis with rheumatoid factor of multiple sites without organ or systems involvement: Secondary | ICD-10-CM

## 2020-06-02 DIAGNOSIS — M47816 Spondylosis without myelopathy or radiculopathy, lumbar region: Secondary | ICD-10-CM

## 2020-06-02 DIAGNOSIS — M797 Fibromyalgia: Secondary | ICD-10-CM

## 2020-06-08 ENCOUNTER — Other Ambulatory Visit: Payer: Self-pay | Admitting: Rheumatology

## 2020-06-08 DIAGNOSIS — M0579 Rheumatoid arthritis with rheumatoid factor of multiple sites without organ or systems involvement: Secondary | ICD-10-CM

## 2020-06-08 NOTE — Telephone Encounter (Signed)
Last Visit: 05/27/2020 Next Visit: 09/02/2020  Okay to refill per Dr. Estanislado Pandy

## 2020-06-11 ENCOUNTER — Encounter: Payer: Self-pay | Admitting: Family

## 2020-06-11 ENCOUNTER — Ambulatory Visit: Payer: Self-pay

## 2020-06-11 ENCOUNTER — Ambulatory Visit: Payer: Medicare PPO | Admitting: Family

## 2020-06-11 VITALS — Ht 64.0 in | Wt 174.0 lb

## 2020-06-11 DIAGNOSIS — S92514A Nondisplaced fracture of proximal phalanx of right lesser toe(s), initial encounter for closed fracture: Secondary | ICD-10-CM | POA: Diagnosis not present

## 2020-06-11 DIAGNOSIS — M79671 Pain in right foot: Secondary | ICD-10-CM | POA: Diagnosis not present

## 2020-06-11 NOTE — Progress Notes (Signed)
Office Visit Note   Patient: Eileen Wilkerson           Date of Birth: 1932/02/09           MRN: [id number] Visit Date: 06/11/2020              Requested by: [name], MD [address] [place],  [place] [address] PCP: [name], MD  Chief Complaint  Patient presents with  . Right Foot - Injury      HPI: Patient is an 86 year old woman who presents today for initial evaluation of toe and foot pain along the lateral column.  She stubbed her toe on her walkers wheel 2 days ago has not had any improvement in her pain has worsening of swelling concern for foot fracture rather than her toe fracture today is full weightbearing with a four-point cane in her Birkenstock sandals little pain with ambulation  Assessment & Plan: Visit Diagnoses:  1. Pain in right foot     Plan: Fracture of the proximal phalanx of the fifth toe.  Patient will proceed with stiff shoe wear for the next 4 to 6 weeks.  May weight-bear as tolerated.  She will follow-up in the office as needed  Follow-Up Instructions: No follow-ups on file.   Ortho Exam  Patient is alert, oriented, no adenopathy, well-dressed, normal affect, normal respiratory effort. On examination of the right foot.  The fifth toe is straight there is mild erythema and edema over the MTP joint.  She is most tender over the head of the fifth metatarsal.  Painless range of motion of the fifth toe  Imaging: No results found. No images are attached to the encounter.  Labs: Lab Results  Component Value Date   ESRSEDRATE 74 (H) 05/17/2017   CRP 2.0 05/17/2017   REPTSTATUS 02/03/2018 FINAL 02/03/2018   REPTSTATUS 02/06/2018 FINAL 02/03/2018   GRAMSTAIN  02/03/2018    MODERATE WBC PRESENT, PREDOMINANTLY MONONUCLEAR MODERATE SQUAMOUS EPITHELIAL CELLS PRESENT MODERATE GRAM POSITIVE RODS MODERATE GRAM POSITIVE COCCI IN PAIRS IN CLUSTERS FEW GRAM NEGATIVE COCCI IN PAIRS RARE GRAM NEGATIVE RODS    CULT  02/03/2018    ABUNDANT  Consistent with normal respiratory flora. Performed at [facility], [facility] [address], [place], [place] [address]      Lab Results  Component Value Date   ALBUMIN 4.0 08/21/2019   ALBUMIN 4.0 06/03/2019   ALBUMIN 4.4 01/24/2019    Lab Results  Component Value Date   MG 1.8 08/21/2019   MG 2.0 06/03/2019   MG 2.1 02/13/2018   Lab Results  Component Value Date   VD25OH 61 04/06/2020   VD25OH 51.95 02/21/2018    No results found for: PREALBUMIN CBC EXTENDED Latest Ref Rng & Units 05/13/2020 04/06/2020 08/21/2019  WBC 3.8 - 10.8 Thousand/uL 13.7(H) 11.4(H) 12.7(H)  RBC 3.80 - 5.10 Million/uL 4.55 4.40 3.92  HGB 11.7 - 15.5 g/dL 14.2 13.7 12.6  HCT 35 - 45 % 43.9 41.3 39.1  PLT 140 - 400 Thousand/uL 306 293 342.0  NEUTROABS 1,500 - 7,800 cells/uL 9,987(H) 5,985 10.7(H)  LYMPHSABS 850 - 3,900 cells/uL 2,384 4,286(H) 1.5     Body mass index is 29.87 kg/m.  Orders:  Orders Placed This Encounter  Procedures  . XR Foot Complete Right   No orders of the defined types were placed in this encounter.    Procedures: No procedures performed  Clinical Data: No additional findings.  ROS:  All other systems negative,  except as noted in the HPI. Review of Systems  Constitutional: Negative for chills and fever.  Musculoskeletal: Positive for arthralgias.  Skin: Negative for wound.    Objective: Vital Signs: Ht 5\' 4"  (1.626 m)   Wt 174 lb (78.9 kg)   BMI 29.87 kg/m   Specialty Comments:  No specialty comments available.  PMFS History: Patient Active Problem List   Diagnosis Date Noted  . Hair loss 08/06/2019  . SOB (shortness of breath) on exertion 08/06/2019  . Pure hypercholesterolemia 08/06/2019  . B12 deficiency 03/11/2018  . Idiopathic chronic venous hypertension of both lower extremities with ulcer and inflammation ([place]) 09/18/2017  . Bilateral lower extremity edema 06/30/2017  . Physical deconditioning 05/30/2017  . Immunosuppressed status ([place])  05/30/2017  . Abnormal chest x-ray 05/17/2017  . Fibromyalgia 12/18/2016  . DJD (degenerative joint disease), cervical 12/18/2016  . Spondylosis of lumbar region without myelopathy or radiculopathy 12/18/2016  . Trigger finger, right middle finger 12/18/2016  . Primary osteoarthritis of both feet 12/18/2016  . Primary osteoarthritis of both knees 12/18/2016  . GERD (gastroesophageal reflux disease) 12/18/2016  . Age-related osteoporosis without current pathological fracture 12/18/2016  . Rheumatoid arthritis ([place]) 12/28/2015  . Long term current use of systemic steroids, for RA 12/28/2015  . Lymphocytosis 11/13/2012   Past Medical History:  Diagnosis Date  . Adrenal failure ([place])   . Arthritis   . Asthma   . Cancer ([place])   . Cataract   . Closed nondisplaced fracture of fifth right metatarsal bone 09/18/2017  . Fibromyalgia 2008  . HCAP (healthcare-associated pneumonia) 02/03/2018  . Osteoporosis   . RA (rheumatoid arthritis) ([place])   . Recurrent upper respiratory infection (URI)   . Sepsis due to urinary tract infection ([place]) 01/18/2018  . Urticaria     Family History  Problem Relation Age of Onset  . Heart attack Maternal Grandmother   . Heart attack Paternal Grandfather   . Breast cancer Mother 44  . Diabetes Father   . Heart disease Father   . Congestive Heart Failure Father 26       Died from  . Allergic rhinitis Neg Hx   . Asthma Neg Hx   . Eczema Neg Hx   . Urticaria Neg Hx     Past Surgical History:  Procedure Laterality Date  . BACK SURGERY    . BILATERAL CARPAL TUNNEL RELEASE  2005   right and left  . CATARACT EXTRACTION, BILATERAL  2004   right and left  . CERVICAL FUSION  2011,2010,2008   2 disks  . HEEL SPUR SURGERY  2004  . lower back fusion  2011   Fusion of 3-4 and 4-5 lower back  . [place]  . ROTATOR CUFF REPAIR  [id number]  . SQUAMOUS CELL CARCINOMA EXCISION    . TONSILLECTOMY AND ADENOIDECTOMY  1947  . TOTAL SHOULDER  ARTHROPLASTY     Social History   Occupational History  . Not on file  Tobacco Use  . Smoking status: Former Smoker    Packs/day: 0.75    Years: 10.00    Pack years: 7.50    Types: Cigarettes    Quit date: 1968    Years since quitting: 53.5  . Smokeless tobacco: Never Used  Vaping Use  . Vaping Use: Never used  Substance and Sexual Activity  . Alcohol use: No  . Drug use: No  . Sexual activity: Not Currently

## 2020-06-20 ENCOUNTER — Other Ambulatory Visit: Payer: Self-pay | Admitting: Rheumatology

## 2020-06-21 NOTE — Telephone Encounter (Signed)
Last Visit: 05/27/2020 Next Visit: 09/02/2020 Labs: 05/13/2020 GFR is low, most likely due to Lasix use. Calcium is high. She has chronic elevation of white cell count.  Current Dose per office note on 05/27/2020: leflunomide 10 mg p.o. daily. DX: Rheumatoid arthritis involving multiple sites with positive rheumatoid factor  Okay to refill leflunomide?

## 2020-06-23 ENCOUNTER — Inpatient Hospital Stay (HOSPITAL_COMMUNITY)
Admission: EM | Admit: 2020-06-23 | Discharge: 2020-06-29 | DRG: 872 | Disposition: A | Discharge status: Home Health | Payer: Medicare PPO | Attending: Internal Medicine | Admitting: Internal Medicine

## 2020-06-23 ENCOUNTER — Emergency Department (HOSPITAL_COMMUNITY): Payer: Medicare PPO

## 2020-06-23 ENCOUNTER — Encounter (HOSPITAL_COMMUNITY): Payer: Self-pay

## 2020-06-23 ENCOUNTER — Other Ambulatory Visit: Payer: Self-pay

## 2020-06-23 DIAGNOSIS — Z96619 Presence of unspecified artificial shoulder joint: Secondary | ICD-10-CM | POA: Diagnosis present

## 2020-06-23 DIAGNOSIS — M069 Rheumatoid arthritis, unspecified: Secondary | ICD-10-CM | POA: Diagnosis not present

## 2020-06-23 DIAGNOSIS — E2740 Unspecified adrenocortical insufficiency: Secondary | ICD-10-CM | POA: Diagnosis not present

## 2020-06-23 DIAGNOSIS — J9811 Atelectasis: Secondary | ICD-10-CM | POA: Diagnosis not present

## 2020-06-23 DIAGNOSIS — R52 Pain, unspecified: Secondary | ICD-10-CM | POA: Diagnosis not present

## 2020-06-23 DIAGNOSIS — R9431 Abnormal electrocardiogram [ECG] [EKG]: Secondary | ICD-10-CM | POA: Diagnosis not present

## 2020-06-23 DIAGNOSIS — I509 Heart failure, unspecified: Secondary | ICD-10-CM | POA: Diagnosis present

## 2020-06-23 DIAGNOSIS — Z881 Allergy status to other antibiotic agents status: Secondary | ICD-10-CM

## 2020-06-23 DIAGNOSIS — D849 Immunodeficiency, unspecified: Secondary | ICD-10-CM | POA: Diagnosis present

## 2020-06-23 DIAGNOSIS — M1611 Unilateral primary osteoarthritis, right hip: Secondary | ICD-10-CM | POA: Diagnosis present

## 2020-06-23 DIAGNOSIS — N83292 Other ovarian cyst, left side: Secondary | ICD-10-CM | POA: Diagnosis not present

## 2020-06-23 DIAGNOSIS — Z981 Arthrodesis status: Secondary | ICD-10-CM

## 2020-06-23 DIAGNOSIS — Z79899 Other long term (current) drug therapy: Secondary | ICD-10-CM

## 2020-06-23 DIAGNOSIS — Z8249 Family history of ischemic heart disease and other diseases of the circulatory system: Secondary | ICD-10-CM

## 2020-06-23 DIAGNOSIS — E876 Hypokalemia: Secondary | ICD-10-CM | POA: Diagnosis not present

## 2020-06-23 DIAGNOSIS — K5720 Diverticulitis of large intestine with perforation and abscess without bleeding: Secondary | ICD-10-CM

## 2020-06-23 DIAGNOSIS — Z882 Allergy status to sulfonamides status: Secondary | ICD-10-CM

## 2020-06-23 DIAGNOSIS — Z886 Allergy status to analgesic agent status: Secondary | ICD-10-CM | POA: Diagnosis not present

## 2020-06-23 DIAGNOSIS — Z87891 Personal history of nicotine dependence: Secondary | ICD-10-CM

## 2020-06-23 DIAGNOSIS — Z9841 Cataract extraction status, right eye: Secondary | ICD-10-CM

## 2020-06-23 DIAGNOSIS — M797 Fibromyalgia: Secondary | ICD-10-CM | POA: Diagnosis not present

## 2020-06-23 DIAGNOSIS — Z880 Allergy status to penicillin: Secondary | ICD-10-CM | POA: Diagnosis not present

## 2020-06-23 DIAGNOSIS — K219 Gastro-esophageal reflux disease without esophagitis: Secondary | ICD-10-CM | POA: Diagnosis present

## 2020-06-23 DIAGNOSIS — Z7952 Long term (current) use of systemic steroids: Secondary | ICD-10-CM | POA: Diagnosis not present

## 2020-06-23 DIAGNOSIS — R531 Weakness: Secondary | ICD-10-CM | POA: Diagnosis not present

## 2020-06-23 DIAGNOSIS — Z20822 Contact with and (suspected) exposure to covid-19: Secondary | ICD-10-CM | POA: Diagnosis present

## 2020-06-23 DIAGNOSIS — N83202 Unspecified ovarian cyst, left side: Secondary | ICD-10-CM | POA: Diagnosis present

## 2020-06-23 DIAGNOSIS — E785 Hyperlipidemia, unspecified: Secondary | ICD-10-CM | POA: Diagnosis present

## 2020-06-23 DIAGNOSIS — K6389 Other specified diseases of intestine: Secondary | ICD-10-CM | POA: Diagnosis not present

## 2020-06-23 DIAGNOSIS — A419 Sepsis, unspecified organism: Secondary | ICD-10-CM

## 2020-06-23 DIAGNOSIS — J45909 Unspecified asthma, uncomplicated: Secondary | ICD-10-CM | POA: Diagnosis present

## 2020-06-23 DIAGNOSIS — E538 Deficiency of other specified B group vitamins: Secondary | ICD-10-CM | POA: Diagnosis present

## 2020-06-23 DIAGNOSIS — Z91013 Allergy to seafood: Secondary | ICD-10-CM | POA: Diagnosis not present

## 2020-06-23 DIAGNOSIS — E7800 Pure hypercholesterolemia, unspecified: Secondary | ICD-10-CM | POA: Diagnosis present

## 2020-06-23 DIAGNOSIS — G5603 Carpal tunnel syndrome, bilateral upper limbs: Secondary | ICD-10-CM | POA: Diagnosis present

## 2020-06-23 DIAGNOSIS — E860 Dehydration: Secondary | ICD-10-CM | POA: Diagnosis not present

## 2020-06-23 DIAGNOSIS — Z9842 Cataract extraction status, left eye: Secondary | ICD-10-CM

## 2020-06-23 DIAGNOSIS — I700 Atherosclerosis of aorta: Secondary | ICD-10-CM | POA: Diagnosis not present

## 2020-06-23 DIAGNOSIS — Z888 Allergy status to other drugs, medicaments and biological substances status: Secondary | ICD-10-CM

## 2020-06-23 DIAGNOSIS — E039 Hypothyroidism, unspecified: Secondary | ICD-10-CM | POA: Diagnosis present

## 2020-06-23 DIAGNOSIS — Z833 Family history of diabetes mellitus: Secondary | ICD-10-CM | POA: Diagnosis not present

## 2020-06-23 DIAGNOSIS — M47812 Spondylosis without myelopathy or radiculopathy, cervical region: Secondary | ICD-10-CM | POA: Diagnosis present

## 2020-06-23 DIAGNOSIS — R1084 Generalized abdominal pain: Secondary | ICD-10-CM | POA: Diagnosis not present

## 2020-06-23 DIAGNOSIS — R000 Tachycardia, unspecified: Secondary | ICD-10-CM | POA: Diagnosis not present

## 2020-06-23 DIAGNOSIS — R110 Nausea: Secondary | ICD-10-CM | POA: Diagnosis not present

## 2020-06-23 LAB — CBC WITH DIFFERENTIAL/PLATELET
Abs Immature Granulocytes: 0.09 10*3/uL — ABNORMAL HIGH (ref 0.00–0.07)
Basophils Absolute: 0.1 10*3/uL (ref 0.0–0.1)
Basophils Relative: 1 %
Eosinophils Absolute: 0.1 10*3/uL (ref 0.0–0.5)
Eosinophils Relative: 0 %
HCT: 42.4 % (ref 36.0–46.0)
Hemoglobin: 13.6 g/dL (ref 12.0–15.0)
Immature Granulocytes: 1 %
Lymphocytes Relative: 14 %
Lymphs Abs: 2.5 10*3/uL (ref 0.7–4.0)
MCH: 32.2 pg (ref 26.0–34.0)
MCHC: 32.1 g/dL (ref 30.0–36.0)
MCV: 100.2 fL — ABNORMAL HIGH (ref 80.0–100.0)
Monocytes Absolute: 1.9 10*3/uL — ABNORMAL HIGH (ref 0.1–1.0)
Monocytes Relative: 11 %
Neutro Abs: 12.8 10*3/uL — ABNORMAL HIGH (ref 1.7–7.7)
Neutrophils Relative %: 73 %
Platelets: 272 10*3/uL (ref 150–400)
RBC: 4.23 MIL/uL (ref 3.87–5.11)
RDW: 13.7 % (ref 11.5–15.5)
WBC: 17.3 10*3/uL — ABNORMAL HIGH (ref 4.0–10.5)
nRBC: 0 % (ref 0.0–0.2)

## 2020-06-23 LAB — COMPREHENSIVE METABOLIC PANEL
ALT: 15 U/L (ref 0–44)
AST: 18 U/L (ref 15–41)
Albumin: 3.3 g/dL — ABNORMAL LOW (ref 3.5–5.0)
Alkaline Phosphatase: 49 U/L (ref 38–126)
Anion gap: 16 — ABNORMAL HIGH (ref 5–15)
BUN: 11 mg/dL (ref 8–23)
CO2: 27 mmol/L (ref 22–32)
Calcium: 9 mg/dL (ref 8.9–10.3)
Chloride: 96 mmol/L — ABNORMAL LOW (ref 98–111)
Creatinine, Ser: 0.95 mg/dL (ref 0.44–1.00)
GFR calc Af Amer: >60 mL/min (ref >60)
GFR calc non Af Amer: 55 mL/min — ABNORMAL LOW (ref >60)
Glucose, Bld: 108 mg/dL — ABNORMAL HIGH (ref 70–99)
Potassium: 3.5 mmol/L (ref 3.5–5.1)
Sodium: 139 mmol/L (ref 135–145)
Total Bilirubin: 1.2 mg/dL (ref 0.3–1.2)
Total Protein: 7.3 g/dL (ref 6.5–8.1)

## 2020-06-23 LAB — LACTIC ACID, PLASMA: Lactic Acid, Venous: 1.5 mmol/L (ref 0.5–1.9)

## 2020-06-23 LAB — SARS CORONAVIRUS 2 BY RT PCR (HOSPITAL ORDER, PERFORMED IN ~~LOC~~ HOSPITAL LAB): SARS Coronavirus 2: NEGATIVE

## 2020-06-23 LAB — LIPASE, BLOOD: Lipase: 21 U/L (ref 11–51)

## 2020-06-23 MED ORDER — FLUTICASONE PROPIONATE 50 MCG/ACT NA SUSP
2.0000 | Freq: Every day | NASAL | Status: DC
  Administered 2020-06-23 – 2020-06-29 (×6): 2 via NASAL
  Filled 2020-06-23: qty 16

## 2020-06-23 MED ORDER — SODIUM CHLORIDE 0.9 % IV SOLN
2.0000 g | Freq: Once | INTRAVENOUS | Status: AC
  Administered 2020-06-23: 2 g via INTRAVENOUS
  Filled 2020-06-23: qty 20

## 2020-06-23 MED ORDER — METRONIDAZOLE IN NACL 5-0.79 MG/ML-% IV SOLN
500.0000 mg | Freq: Three times a day (TID) | INTRAVENOUS | Status: DC
  Administered 2020-06-24 (×4): 500 mg via INTRAVENOUS
  Filled 2020-06-23 (×4): qty 100

## 2020-06-23 MED ORDER — ATORVASTATIN CALCIUM 10 MG PO TABS
20.0000 mg | ORAL_TABLET | Freq: Every day | ORAL | Status: DC
  Administered 2020-06-23 – 2020-06-29 (×6): 20 mg via ORAL
  Filled 2020-06-23 (×7): qty 2

## 2020-06-23 MED ORDER — MONTELUKAST SODIUM 10 MG PO TABS
10.0000 mg | ORAL_TABLET | Freq: Every day | ORAL | Status: DC
  Administered 2020-06-23 – 2020-06-28 (×6): 10 mg via ORAL
  Filled 2020-06-23 (×6): qty 1

## 2020-06-23 MED ORDER — MORPHINE SULFATE (PF) 2 MG/ML IV SOLN
2.0000 mg | INTRAVENOUS | Status: DC | PRN
  Administered 2020-06-23 – 2020-06-25 (×6): 2 mg via INTRAVENOUS
  Filled 2020-06-23 (×6): qty 1

## 2020-06-23 MED ORDER — IOHEXOL 300 MG/ML  SOLN
100.0000 mL | Freq: Once | INTRAMUSCULAR | Status: AC | PRN
  Administered 2020-06-23: 100 mL via INTRAVENOUS

## 2020-06-23 MED ORDER — SODIUM CHLORIDE 0.9 % IV BOLUS
1000.0000 mL | Freq: Once | INTRAVENOUS | Status: AC
  Administered 2020-06-23: 1000 mL via INTRAVENOUS

## 2020-06-23 MED ORDER — CYCLOSPORINE 0.05 % OP EMUL
1.0000 [drp] | Freq: Two times a day (BID) | OPHTHALMIC | Status: DC
  Administered 2020-06-24 – 2020-06-29 (×10): 1 [drp] via OPHTHALMIC
  Filled 2020-06-23 (×12): qty 1

## 2020-06-23 MED ORDER — SODIUM CHLORIDE 0.9 % IV SOLN: INTRAVENOUS | Status: DC

## 2020-06-23 MED ORDER — LEVOFLOXACIN IN D5W 750 MG/150ML IV SOLN: 750.0000 mg | Freq: Once | INTRAVENOUS | Status: DC

## 2020-06-23 MED ORDER — ONDANSETRON HCL 4 MG/2ML IJ SOLN
4.0000 mg | Freq: Once | INTRAMUSCULAR | Status: AC
  Administered 2020-06-23: 4 mg via INTRAVENOUS
  Filled 2020-06-23: qty 2

## 2020-06-23 MED ORDER — MORPHINE SULFATE (PF) 4 MG/ML IV SOLN
4.0000 mg | Freq: Once | INTRAVENOUS | Status: AC
  Administered 2020-06-23: 4 mg via INTRAVENOUS
  Filled 2020-06-23: qty 1

## 2020-06-23 MED ORDER — SODIUM CHLORIDE (PF) 0.9 % IJ SOLN
INTRAMUSCULAR | Status: AC
  Filled 2020-06-23: qty 50

## 2020-06-23 MED ORDER — PREDNISONE 20 MG PO TABS
10.0000 mg | ORAL_TABLET | Freq: Every day | ORAL | Status: DC
  Administered 2020-06-24 – 2020-06-29 (×6): 10 mg via ORAL
  Filled 2020-06-23 (×6): qty 1

## 2020-06-23 MED ORDER — SODIUM CHLORIDE 0.9 % IV SOLN
2.0000 g | INTRAVENOUS | Status: DC
  Administered 2020-06-24: 2 g via INTRAVENOUS
  Filled 2020-06-23: qty 20
  Filled 2020-06-23: qty 2

## 2020-06-23 MED ORDER — METRONIDAZOLE IN NACL 5-0.79 MG/ML-% IV SOLN
500.0000 mg | Freq: Once | INTRAVENOUS | Status: AC
  Administered 2020-06-23: 500 mg via INTRAVENOUS
  Filled 2020-06-23: qty 100

## 2020-06-23 NOTE — ED Provider Notes (Addendum)
[facility] DEPT Provider Note   CSN: [id number] Arrival date & time: 06/23/20  1300     History Chief Complaint  Patient presents with  . Weakness    Eileen Wilkerson is a 86 y.o. female with past medical history significant for adrenal failure on chronic prednisone, asthma, fibromyalgia, seropositive rheumatoid, CHF. Patient is not on anticoagulation. She topped taking leflunomide x 3 weeks ago because she did not like how it made her feel.   Patient received both Covid vaccinations.  HPI Patient presents to emergency department today via EMS with chief complaint of progressively worsening generalized weakness and fever x2 days.  Patient states she is a resident at a friend's home.  She has had T-max of 100.1 earlier this morning.  She thinks she is dehydrated because she has not been drinking or eating secondary to loss of appetite.  She has had nausea without emesis. She also had 3 episodes of nonbloody diarrhea that started last night. Patient is not tried any medication for symptoms prior to arrival.  She denies any recent fall or head injury.  She did use an Azo test strip that was negative for urinary tract infection.  She denies any recent antibiotic use. She denies any chills, cough, hemoptysis, shortness of breath, chest pain, back pain, urinary frequency, dysuria, gross hematuria, numbness, weakness, tingling. No history of abdominal surgeries. Last PO intake was x 2 days ago.    Past Medical History:  Diagnosis Date  . Adrenal failure ([place])   . Arthritis   . Asthma   . Cancer ([place])   . Cataract   . Closed nondisplaced fracture of fifth right metatarsal bone 09/18/2017  . Fibromyalgia 2008  . HCAP (healthcare-associated pneumonia) 02/03/2018  . Osteoporosis   . RA (rheumatoid arthritis) ([place])   . Recurrent upper respiratory infection (URI)   . Sepsis due to urinary tract infection ([place]) 01/18/2018  . Urticaria     Patient Active Problem  List   Diagnosis Date Noted  . Perforation of sigmoid colon due to diverticulitis 06/23/2020  . Hair loss 08/06/2019  . SOB (shortness of breath) on exertion 08/06/2019  . Pure hypercholesterolemia 08/06/2019  . B12 deficiency 03/11/2018  . Idiopathic chronic venous hypertension of both lower extremities with ulcer and inflammation ([place]) 09/18/2017  . Bilateral lower extremity edema 06/30/2017  . Physical deconditioning 05/30/2017  . Immunosuppressed status ([place]) 05/30/2017  . Abnormal chest x-ray 05/17/2017  . Fibromyalgia 12/18/2016  . DJD (degenerative joint disease), cervical 12/18/2016  . Spondylosis of lumbar region without myelopathy or radiculopathy 12/18/2016  . Trigger finger, right middle finger 12/18/2016  . Primary osteoarthritis of both feet 12/18/2016  . Primary osteoarthritis of both knees 12/18/2016  . GERD (gastroesophageal reflux disease) 12/18/2016  . Age-related osteoporosis without current pathological fracture 12/18/2016  . Rheumatoid arthritis ([place]) 12/28/2015  . Long term current use of systemic steroids, for RA 12/28/2015  . Lymphocytosis 11/13/2012    Past Surgical History:  Procedure Laterality Date  . BACK SURGERY    . BILATERAL CARPAL TUNNEL RELEASE  2005   right and left  . CATARACT EXTRACTION, BILATERAL  2004   right and left  . CERVICAL FUSION  2011,2010,2008   2 disks  . HEEL SPUR SURGERY  2004  . lower back fusion  2011   Fusion of 3-4 and 4-5 lower back  . [place]  . ROTATOR CUFF REPAIR  [id number]  . SQUAMOUS CELL CARCINOMA EXCISION    .  TONSILLECTOMY AND ADENOIDECTOMY  1947  . TOTAL SHOULDER ARTHROPLASTY       OB History   No obstetric history on file.     Family History  Problem Relation Age of Onset  . Heart attack Maternal Grandmother   . Heart attack Paternal Grandfather   . Breast cancer Mother 71  . Diabetes Father   . Heart disease Father   . Congestive Heart Failure Father 52       Died from    . Allergic rhinitis Neg Hx   . Asthma Neg Hx   . Eczema Neg Hx   . Urticaria Neg Hx     Social History   Tobacco Use  . Smoking status: Former Smoker    Packs/day: 0.75    Years: 10.00    Pack years: 7.50    Types: Cigarettes    Quit date: 1968    Years since quitting: 53.5  . Smokeless tobacco: Never Used  Vaping Use  . Vaping Use: Never used  Substance Use Topics  . Alcohol use: No  . Drug use: No    Home Medications Prior to Admission medications   Medication Sig Start Date End Date Taking? Authorizing Provider  acetaminophen (TYLENOL) 500 MG tablet Take 500 mg by mouth every 6 (six) hours.     [provider]  atorvastatin (LIPITOR) 20 MG tablet Take 1 tablet (20 mg total) by mouth daily. 05/27/20   [name], MD  Biotin (BIOTIN MAXIMUM STRENGTH) 10 MG TABS Take 10 mg by mouth daily.    [provider]  Cholecalciferol (VITAMIN D3) 5000 units CAPS Take 5,000 Units by mouth daily.    [provider]  CRANBERRY PO Take 1 capsule by mouth daily.    [provider]  cyanocobalamin (,VITAMIN B-12,) 1000 MCG/ML injection 1000 mcg injection once per month. 02/12/20   [name], MD  cycloSPORINE (RESTASIS) 0.05 % ophthalmic emulsion 1 drop 2 (two) times daily.    [provider]  diclofenac Sodium (VOLTAREN) 1 % GEL APPLY 2 TO 4 GRAMS TOPICALLY TO AFFECTED JOINTS UP TO 4 TIMES DAILY 06/08/20   Bo Merino, MD  EPINEPHrine 0.3 mg/0.3 mL IJ SOAJ injection INJECT 0.3MLS INTO THE MUSCLE ONCE FOR A ONE TIME DOSE 10/09/18   [provider]  fexofenadine (ALLEGRA) 180 MG tablet Take 180 mg by mouth daily.    [provider]  fluticasone (FLONASE) 50 MCG/ACT nasal spray Place 2 sprays into both nostrils daily. 04/12/20   [name], MD  furosemide (LASIX) 80 MG tablet Take 1 tablet (80 mg total) by mouth daily. 02/26/20   [name], MD  ketoconazole (NIZORAL) 2 % shampoo Apply 1 application topically 2  (two) times a week.    [provider]  lansoprazole (PREVACID) 30 MG capsule Take one capsule by mouth once daily at 12 noon. 03/25/20   [name], MD  leflunomide (ARAVA) 10 MG tablet TAKE 1 TABLET BY MOUTH EVERY DAY 06/21/20   Ofilia Neas, PA-C  MELATONIN PO Take 10 mg by mouth. nightly    [provider]  montelukast (SINGULAIR) 10 MG tablet Take 1 tablet (10 mg total) by mouth at bedtime. 05/20/20   [name], MD  potassium chloride (KLOR-CON) 10 MEQ tablet Take 2 tablets (20 mEq total) by mouth 2 (two) times daily. 12/19/19 05/27/20  Leamon Arnt, MD  predniSONE (DELTASONE) 10 MG tablet TAKE 1 TABLET (10 MG TOTAL) BY  MOUTH DAILY WITH BREAKFAST. 05/31/20   [name], MD  Syringe/Needle, Disp, (SYRINGE 3CC/25GX1") 25G X 1" 3 ML MISC 1 application by Does not apply route every 30 (thirty) days. 08/20/19   Briscoe Deutscher, DO  traMADol (ULTRAM) 50 MG tablet TAKE 1 TABLET BY MOUTH 4 TIMES A DAY 06/03/20   [name], MD    Allergies    Adhesive [tape], Celebrex [celecoxib], Ciprofibrate, Cymbalta [duloxetine hcl], Gabitril [tiagabine], Keflex [cephalexin], Lyrica [pregabalin], Neurontin [gabapentin], Nexium [esomeprazole], Nsaids, Penicillins, Shrimp [shellfish allergy], Sulfa antibiotics, Azactam [aztreonam], Azelastine hcl, Ciprofloxacin, Claritin [loratadine], Methotrexate derivatives, Nasacort [triamcinolone], Olopatadine, Sulfamethizole, Zantac [ranitidine hcl], and Claritin-d 12 hour [loratadine-pseudoephedrine er]  Review of Systems   Review of Systems  All other systems are reviewed and are negative for acute change except as noted in the HPI.   Physical Exam Updated Vital Signs BP 129/61 (BP Location: Right Arm)   Pulse (!) 109   Temp 98.9 F (37.2 C) (Oral)   Resp 20   SpO2 93%   Physical Exam Vitals and nursing note reviewed.  Constitutional:      General: She is not in acute distress.    Appearance: She is ill-appearing. She is not  toxic-appearing.  HENT:     Head: Normocephalic and atraumatic.     Right Ear: Tympanic membrane and external ear normal.     Left Ear: Tympanic membrane and external ear normal.     Nose: Nose normal.     Mouth/Throat:     Mouth: Mucous membranes are dry.     Pharynx: Oropharynx is clear.  Eyes:     General: No scleral icterus.       Right eye: No discharge.        Left eye: No discharge.     Extraocular Movements: Extraocular movements intact.     Conjunctiva/sclera: Conjunctivae normal.     Pupils: Pupils are equal, round, and reactive to light.  Neck:     Vascular: No JVD.  Cardiovascular:     Rate and Rhythm: Normal rate and regular rhythm.     Pulses: Normal pulses.          Radial pulses are 2+ on the right side and 2+ on the left side.     Heart sounds: Normal heart sounds.  Pulmonary:     Comments: Lungs clear to auscultation in all fields. Symmetric chest rise. No wheezing, rales, or rhonchi.  Oxygen saturation is 93% on room air. Abdominal:     General: Bowel sounds are normal.     Tenderness: There is no right CVA tenderness or left CVA tenderness.     Comments: Abdomen is soft, non-distended, Tenderness to palpation of left lower quadrant and suprapubic area. No rigidity, no guarding. No peritoneal signs.  Musculoskeletal:        General: Normal range of motion.     Cervical back: Normal range of motion.  Skin:    General: Skin is warm and dry.     Capillary Refill: Capillary refill takes less than 2 seconds.  Neurological:     Mental Status: She is oriented to person, place, and time.     GCS: GCS eye subscore is 4. GCS verbal subscore is 5. GCS motor subscore is 6.     Comments: Speech is clear and goal oriented, follows commands CN III-XII intact, no facial droop Normal strength in upper and lower extremities bilaterally including dorsiflexion and plantar flexion, strong and equal grip strength Sensation normal  to light and sharp touch Moves extremities  without ataxia, coordination intact Normal finger to nose and rapid alternating movements    Psychiatric:        Behavior: Behavior normal.     ED Results / Procedures / Treatments   Labs (all labs ordered are listed, but only abnormal results are displayed) Labs Reviewed  COMPREHENSIVE METABOLIC PANEL - Abnormal; Notable for the following components:      Result Value   Chloride 96 (*)    Glucose, Bld 108 (*)    Albumin 3.3 (*)    GFR calc non Af Amer 55 (*)    Anion gap 16 (*)    All other components within normal limits  CBC WITH DIFFERENTIAL/PLATELET - Abnormal; Notable for the following components:   WBC 17.3 (*)    MCV 100.2 (*)    Neutro Abs 12.8 (*)    Monocytes Absolute 1.9 (*)    Abs Immature Granulocytes 0.09 (*)    All other components within normal limits  SARS CORONAVIRUS 2 BY RT PCR (HOSPITAL ORDER, [facility])  URINE CULTURE  CULTURE, BLOOD (ROUTINE X 2)  CULTURE, BLOOD (ROUTINE X 2)  LIPASE, BLOOD  LACTIC ACID, PLASMA  LACTIC ACID, PLASMA  URINALYSIS, ROUTINE W REFLEX MICROSCOPIC    EKG None  Radiology CT ABDOMEN PELVIS W CONTRAST  Result Date: 06/23/2020 CLINICAL DATA:  Weakness, fever, dehydration EXAM: CT ABDOMEN AND PELVIS WITH CONTRAST TECHNIQUE: Multidetector CT imaging of the abdomen and pelvis was performed using the standard protocol following bolus administration of intravenous contrast. CONTRAST:  158m OMNIPAQUE IOHEXOL 300 MG/ML  SOLN COMPARISON:  02/03/2018 FINDINGS: Lower chest: Stable subpleural scarring, with scattered tree in bud opacities at the bases compatible with chronic indolent infection such as MAC. Otherwise no acute pleural or parenchymal lung disease. Hepatobiliary: No focal liver abnormality is seen. No gallstones, gallbladder wall thickening, or biliary dilatation. Pancreas: Unremarkable. No pancreatic ductal dilatation or surrounding inflammatory changes. Spleen: Normal in size without focal  abnormality. Adrenals/Urinary Tract: Adrenal glands are unremarkable. Kidneys are normal, without renal calculi, focal lesion, or hydronephrosis. Bladder is unremarkable. Stomach/Bowel: No bowel obstruction or ileus. There is wall thickening of the sigmoid colon, with surrounding pericolonic fat stranding. There is free gas within the adjacent mesentery, consistent with perforated sigmoid diverticulitis. There is no fluid collection or abscess. Normal appendix is seen in the right mid abdomen. Vascular/Lymphatic: Aortic atherosclerosis. No enlarged abdominal or pelvic lymph nodes. Reproductive: Uterus and right adnexa are unremarkable. There is a 3.4 cm simple appearing cyst within the left ovary, previous having measured 2.9 cm. Other: No free fluid. Free gas in the left lower quadrant adjacent to the sigmoid diverticulitis. No abdominal wall hernia. Musculoskeletal: No acute or destructive bony lesions. Severe osteoarthritis within the right hip. Postsurgical changes lower lumbar spine. Reconstructed images demonstrate no additional findings. IMPRESSION: 1. Acute perforated sigmoid diverticulitis, with pneumoperitoneum in the left lower quadrant. No fluid collection or abscess. 2. A 3.4 cm simple appearing left ovarian cyst, previous having measured 2.9 cm. Pelvic ultrasound recommended if not previously performed. This recommendation follows ACR consensus guidelines: White Paper of the ACR Incidental Findings Committee II on Adnexal Findings. J Am Coll Radiol 2013:10:675-681. 3. Aortic Atherosclerosis (ICD10-I70.0). These results were called by telephone at the time of interpretation on 06/23/2020 at 3:26 pm to provider KEmeterio Reeve, who verbally acknowledged these results. Electronically Signed   By: MRanda NgoM.D.   On: 06/23/2020 15:33  DG Chest Portable 1 View  Result Date: 06/23/2020 CLINICAL DATA:  Nausea and weakness. EXAM: PORTABLE CHEST 1 VIEW COMPARISON:  06/09/1999 FINDINGS:  Hypoventilation. Decreased lung volume with bibasilar atelectasis left greater than right. Negative for heart failure. No pleural effusion. ACDF cervical spine. IMPRESSION: Hypoventilation with bibasilar atelectasis. Electronically Signed   By: Franchot Gallo M.D.   On: 06/23/2020 13:56    Procedures .Critical Care Performed by: Cherre Robins, PA-C Authorized by: Cherre Robins, PA-C   Critical care provider statement:    Critical care time (minutes):  36   Critical care time was exclusive of:  Teaching time and separately billable procedures and treating other patients   Critical care was necessary to treat or prevent imminent or life-threatening deterioration of the following conditions:  Sepsis   Critical care was time spent personally by me on the following activities:  Development of treatment plan with patient or surrogate, discussions with consultants, evaluation of patient's response to treatment, examination of patient, obtaining history from patient or surrogate, ordering and performing treatments and interventions, ordering and review of laboratory studies, ordering and review of radiographic studies, pulse oximetry, re-evaluation of patient's condition and review of old charts   I assumed direction of critical care for this patient from another provider in my specialty: yes     (including critical care time)  Medications Ordered in ED Medications  sodium chloride (PF) 0.9 % injection (  Not Given 06/23/20 1529)  sodium chloride 0.9 % bolus 1,000 mL (0 mLs Intravenous Stopped 06/23/20 1606)  ondansetron (ZOFRAN) injection 4 mg (4 mg Intravenous Given 06/23/20 1345)  morphine 4 MG/ML injection 4 mg (4 mg Intravenous Given 06/23/20 1403)  metroNIDAZOLE (FLAGYL) IVPB 500 mg (500 mg Intravenous New Bag/Given (Non-Interop) 06/23/20 1524)  cefTRIAXone (ROCEPHIN) 2 g in sodium chloride 0.9 % 100 mL IVPB (0 g Intravenous Stopped 06/23/20 1629)  iohexol (OMNIPAQUE) 300 MG/ML solution  100 mL (100 mLs Intravenous Contrast Given 06/23/20 1456)    ED Course  I have reviewed the triage vital signs and the nursing notes.  Pertinent labs & imaging results that were available during my care of the patient were reviewed by me and considered in my medical decision making (see chart for details).  Clinical Course as of Jun 23 1633  Wed Jun 23, 2020  1417 WBC(!): 17.3 [KA]  1423 Code sepsis initiated. Antibiotics ordered for suspected intra abdominal infection. She has multiple allergies. Discussed with ED attending who agrees with plan for ceftriaxone and metronidazole   [KA]  1436 1.5, Within normal range  Lactic acid, plasma [KA]  1448 SARS Coronavirus 2: NEGATIVE [KA]  1548 Radiologist called-acute perforated sigmoid diverticulitis. Surgical consult ordered  CT ABDOMEN PELVIS W CONTRAST [KA]    Clinical Course User Index [KA] Eastin Swing, Harley Hallmark, PA-C   MDM Rules/Calculators/A&P                          History provided by patient with additional history obtained from chart review.     86 yo female presents with generalized weakness and abdominal pain. In triage she is afebrile, tachycardic to low 100s, normotensive, oxygen saturation 93% on room air. On exam she does look like she does not feel well however is not toxic in appearance.  She looks dehydrated, mucous membranes are dry.. she has normal work of breathing and lungs are clear to auscultation all fields.  She has tenderness palpation of left lower  quadrant and suprapubic area.  No peritoneal signs.  No CVA tenderness.  Vitals alone do not indicate sepsis. EKG simialr compared to prior.  Labs are indicative of sepsis.  Code sepsis was initiated with antibiotics ordered for possible intra-abdominal infection.  Patient was given ceftriaxone and metronidazole based on her multiple allergies.  Covid test negative. CBC with leukocytosis, no anemia. CMP without severe electrolyte derangement, no renal insufficiency, she  does have slightly elevated anion gap at 16. Lipase within normal range. Lactic acid within normal range. UA not yet collected.  Blood cultures pending. I viewed pt's chest xray and it does not suggest acute infectious processes. CT A/P shows acute sigmoid diverticulitis with perforation, gas seen in LLQ. No abscess or fluid collection.  I updated patient on CT findings, she still has tenderness palpation of left lower quadrant.  Tachycardia has resolved after fluids, heart rate in the 90s. This case was discussed with Dr. Roderic Palau who has seen the patient and agrees with plan to admit.  Case discussed with Saverio Danker surgical PA-C who is recommending medical admission with surgery following as consult. She will be down to see patient. Spoke with Dr. Wyline Copas with hospitalist service who agrees to assume care of patient and bring into the hospital for further evaluation and management.  Patient's daughter has been called and updated with plan of care.    Portions of this note were generated with [employer]. Dictation errors may occur despite best attempts at proofreading.   Final Clinical Impression(s) / ED Diagnoses Final diagnoses:  Perforation of sigmoid colon due to diverticulitis    Rx / DC Orders ED Discharge Orders    None       Cherre Robins, PA-C 06/23/20 1634    Kamylle Axelson, Harley Hallmark, PA-C 06/23/20 1700    Milton Ferguson, MD 06/24/20 0930

## 2020-06-23 NOTE — Progress Notes (Signed)
Pharmacy Note   A consult was received from an ED physician for levofloxacin per pharmacy dosing.  Per consult, " Pharmacist to investigate beta-lactam allergy. If history of intolerance, mild allergy, or documented history of use of cephalosporins, pharmacy can adjust levofloxacin to ceftriaxone."   Pt has tolerated multiple cephalopsorins in the past, including cefepime, ceftriaxone, cefuroxime, and cefdinir.   Therefore, will change levofloxacin to ceftiaxone    The patient's profile has been reviewed for ht/wt/allergies/indication/available labs.    A one time order has been placed for Ceftriaxone 2 gr IV x1 .     Further antibiotics/pharmacy consults should be ordered by admitting physician if indicated.                       Thank you,  Royetta Asal, PharmD, BCPS Pager [phone] 06/23/2020 2:30 PM

## 2020-06-23 NOTE — ED Triage Notes (Signed)
Pt came via EMS for weakness, fever, and dehydration since 7/12. Pt reports she has been having diarrhea, fever and decreased appetite since Monday.

## 2020-06-23 NOTE — ED Notes (Signed)
Pure wick has been placed. Suction set to 45mmHg.  

## 2020-06-23 NOTE — H&P (Signed)
History and Physical    Eileen Wilkerson GXQ:[id number] DOB: 1932-09-29 DOA: 06/23/2020  PCP: Eileen Dad, MD  Patient coming from: [facility]  Chief Complaint: Abd pain  HPI: Eileen Wilkerson is a 86 y.o. female with medical history significant of RA, hypothyroid, asthma who presents with abd pain over the past 2-3 days. Patient reports initially having decreased appetite on 7/12 with no other notable symptoms. On 7/13, patient experienced marked and persistent abd pain across the abdomen. Symptoms persisted overnight and patient subsequently presented to the ED for further work up. Denies chest pain or sob. Did report a temp as high as 100.1 prior to visit. Pt also reported several bouts of diarrhea associated with abd discomfort  ED Course: In the ED, pt was found to be afebrile. Presenting HR was up to 109 bpm and RR as high as 22. WBC noted to be over 17k with CT abd/pelvis demonstrating acute sigmoid diverticulitis with pneumoperitoneum, reviewed. General Surgery was consulted and hospitalist consulted for consideration for admission.  Review of Systems:  Review of Systems  Constitutional: Positive for fever. Negative for chills and weight loss.  HENT: Negative for congestion, ear discharge and ear pain.   Eyes: Negative for double vision, photophobia and discharge.  Respiratory: Negative for hemoptysis, sputum production, shortness of breath and stridor.   Cardiovascular: Negative for chest pain, claudication and leg swelling.  Gastrointestinal: Positive for abdominal pain and diarrhea.  Genitourinary: Negative for frequency, hematuria and urgency.  Musculoskeletal: Negative for back pain, falls and neck pain.  Neurological: Negative for tingling, tremors, seizures and loss of consciousness.  Psychiatric/Behavioral: Negative for depression, hallucinations and memory loss. The patient does not have insomnia.     Past Medical History:  Diagnosis Date  .  Adrenal failure ([place])   . Arthritis   . Asthma   . Cancer ([place])   . Cataract   . Closed nondisplaced fracture of fifth right metatarsal bone 09/18/2017  . Fibromyalgia 2008  . HCAP (healthcare-associated pneumonia) 02/03/2018  . Osteoporosis   . RA (rheumatoid arthritis) (Eileen Wilkerson)   . Recurrent upper respiratory infection (URI)   . Sepsis due to urinary tract infection ([place]) 01/18/2018  . Urticaria     Past Surgical History:  Procedure Laterality Date  . BACK SURGERY    . BILATERAL CARPAL TUNNEL RELEASE  2005   right and left  . CATARACT EXTRACTION, BILATERAL  2004   right and left  . CERVICAL FUSION  2011,2010,2008   2 disks  . HEEL SPUR SURGERY  2004  . lower back fusion  2011   Fusion of 3-4 and 4-5 lower back  . [place]  . ROTATOR CUFF REPAIR  [id number]  . SQUAMOUS CELL CARCINOMA EXCISION    . TONSILLECTOMY AND ADENOIDECTOMY  1947  . TOTAL SHOULDER ARTHROPLASTY       reports that she quit smoking about 53 years ago. Her smoking use included cigarettes. She has a 7.50 pack-year smoking history. She has never used smokeless tobacco. She reports that she does not drink alcohol and does not use drugs.  Allergies  Allergen Reactions  . Adhesive [Tape] Rash  . Celebrex [Celecoxib] Hives  . Ciprofibrate Nausea Only  . Cymbalta [Duloxetine Hcl] Swelling  . Gabitril [Tiagabine] Swelling  . Keflex [Cephalexin] Nausea And Vomiting  . Lyrica [Pregabalin] Swelling  . Neurontin [Gabapentin] Swelling  . Nexium [Esomeprazole] Rash  . Nsaids Rash  . Penicillins Rash    Injection site  reaction. Tolerated cefepime in past Has patient had a PCN reaction causing immediate rash, facial/tongue/throat swelling, SOB or lightheadedness with hypotension: No Has patient had a PCN reaction causing severe rash involving mucus membranes or skin necrosis: No Has patient had a PCN reaction that required hospitalization No Has patient had a PCN reaction occurring within the last  10 years: No If all of the above answers are "NO", then may proceed with Cephalosporin use.   . Shrimp [Shellfish Allergy] Anaphylaxis    Per patient "shrimp only"  . Sulfa Antibiotics Nausea And Vomiting  . Azactam [Aztreonam]     Hand swelling   . Azelastine Hcl     Rash   . Ciprofloxacin Other (See Comments)    dizziness  . Claritin [Loratadine]     Irritability Nervousness   . Methotrexate Derivatives   . Nasacort [Triamcinolone]     Dizzy   . Olopatadine Other (See Comments)    Pain and lethargy   . Sulfamethizole Other (See Comments)    unknown  . Zantac [Ranitidine Hcl] Other (See Comments)    unknown  . Claritin-D 12 Hour [Loratadine-Pseudoephedrine Er] Anxiety    Family History  Problem Relation Age of Onset  . Heart attack Maternal Grandmother   . Heart attack Paternal Grandfather   . Breast cancer Mother 1  . Diabetes Father   . Heart disease Father   . Congestive Heart Failure Father 71       Died from  . Allergic rhinitis Neg Hx   . Asthma Neg Hx   . Eczema Neg Hx   . Urticaria Neg Hx     Prior to Admission medications   Medication Sig Start Date End Date Taking? Authorizing Provider  acetaminophen (TYLENOL) 500 MG tablet Take 500 mg by mouth every 6 (six) hours.     [provider]  atorvastatin (LIPITOR) 20 MG tablet Take 1 tablet (20 mg total) by mouth daily. 05/27/20   Eileen Dad, MD  Biotin (BIOTIN MAXIMUM STRENGTH) 10 MG TABS Take 10 mg by mouth daily.    [provider]  Cholecalciferol (VITAMIN D3) 5000 units CAPS Take 5,000 Units by mouth daily.    [provider]  CRANBERRY PO Take 1 capsule by mouth daily.    [provider]  cyanocobalamin (,VITAMIN B-12,) 1000 MCG/ML injection 1000 mcg injection once per month. 02/12/20   Eileen Dad, MD  cycloSPORINE (RESTASIS) 0.05 % ophthalmic emulsion 1 drop 2 (two) times daily.    [provider]  diclofenac Sodium (VOLTAREN) 1 % GEL APPLY 2 TO 4 GRAMS  TOPICALLY TO AFFECTED JOINTS UP TO 4 TIMES DAILY 06/08/20   Bo Merino, MD  EPINEPHrine 0.3 mg/0.3 mL IJ SOAJ injection INJECT 0.3MLS INTO THE MUSCLE ONCE FOR A ONE TIME DOSE 10/09/18   [provider]  fexofenadine (ALLEGRA) 180 MG tablet Take 180 mg by mouth daily.    [provider]  fluticasone (FLONASE) 50 MCG/ACT nasal spray Place 2 sprays into both nostrils daily. 04/12/20   Eileen Dad, MD  furosemide (LASIX) 80 MG tablet Take 1 tablet (80 mg total) by mouth daily. 02/26/20   Eileen Dad, MD  ketoconazole (NIZORAL) 2 % shampoo Apply 1 application topically 2 (two) times a week.    [provider]  lansoprazole (PREVACID) 30 MG capsule Take one capsule by mouth once daily at 12 noon. 03/25/20   Eileen Dad, MD  leflunomide (ARAVA) 10 MG tablet TAKE 1  TABLET BY MOUTH EVERY DAY 06/21/20   Ofilia Neas, PA-C  MELATONIN PO Take 10 mg by mouth. nightly    [provider]  montelukast (SINGULAIR) 10 MG tablet Take 1 tablet (10 mg total) by mouth at bedtime. 05/20/20   Eileen Dad, MD  potassium chloride (KLOR-CON) 10 MEQ tablet Take 2 tablets (20 mEq total) by mouth 2 (two) times daily. 12/19/19 05/27/20  Leamon Arnt, MD  predniSONE (DELTASONE) 10 MG tablet TAKE 1 TABLET (10 MG TOTAL) BY MOUTH DAILY WITH BREAKFAST. 05/31/20   Eileen Dad, MD  Syringe/Needle, Disp, (SYRINGE 3CC/25GX1") 25G X 1" 3 ML MISC 1 application by Does not apply route every 30 (thirty) days. 08/20/19   Briscoe Deutscher, DO  traMADol (ULTRAM) 50 MG tablet TAKE 1 TABLET BY MOUTH 4 TIMES A DAY 06/03/20   Eileen Dad, MD    Physical Exam: Vitals:   06/23/20 1314 06/23/20 1322 06/23/20 1543  BP: 129/61  130/70  Pulse: (!) 109  94  Resp: 20  17  Temp: 98.9 F (37.2 C)    TempSrc: Oral    SpO2: 93%  97%  Weight:  78 kg   Height:  _0  (1.626 m)     Constitutional: NAD, calm, comfortable Vitals:   06/23/20 1314 06/23/20 1322 06/23/20 1543  BP: 129/61  130/70    Pulse: (!) 109  94  Resp: 20  17  Temp: 98.9 F (37.2 C)    TempSrc: Oral    SpO2: 93%  97%  Weight:  78 kg   Height:  _1  (1.626 m)    Eyes: PERRL, lids and conjunctivae normal ENMT: Mucous membranes are moist. Posterior pharynx clear of any exudate or lesions.Normal dentition.  Neck: normal, supple, no masses, no thyromegaly Respiratory: clear to auscultation bilaterally, no wheezing, no crackles. Normal respiratory effort. No accessory muscle use.  Cardiovascular: Regular rate and rhythm, s1, s2 Abdomen: generalized tenderness, mildly distended Musculoskeletal: no clubbing / cyanosis. No joint deformity upper and lower extremities. Good ROM, no contractures. Normal muscle tone.  Skin: no rashes, lesions Neurologic: CN 2-12 grossly intact. Sensation intact, DTR normal. Strength 5/5 in all 4.  Psychiatric: Normal judgment and insight. Alert and oriented x 3. Normal mood.    Labs on Admission: I have personally reviewed following labs and imaging studies  CBC: Recent Labs  Lab 06/23/20 1323  WBC 17.3*  NEUTROABS 12.8*  HGB 13.6  HCT 42.4  MCV 100.2*  PLT 811   Basic Metabolic Panel: Recent Labs  Lab 06/23/20 1323  NA 139  K 3.5  CL 96*  CO2 27  GLUCOSE 108*  BUN 11  CREATININE 0.95  CALCIUM 9.0   GFR: Estimated Creatinine Clearance: 43.7 mL/min (by C-G formula based on SCr of 0.95 mg/dL). Liver Function Tests: Recent Labs  Lab 06/23/20 1323  AST 18  ALT 15  ALKPHOS 49  BILITOT 1.2  PROT 7.3  ALBUMIN 3.3*   Recent Labs  Lab 06/23/20 1323  LIPASE 21   No results for input(s): AMMONIA in the last 168 hours. Coagulation Profile: No results for input(s): INR, PROTIME in the last 168 hours. Cardiac Enzymes: No results for input(s): CKTOTAL, CKMB, CKMBINDEX, TROPONINI in the last 168 hours. BNP (last 3 results) No results for input(s): PROBNP in the last 8760 hours. HbA1C: No results for input(s): HGBA1C in the last 72 hours. CBG: No results for  input(s): GLUCAP in the last 168 hours. Lipid Profile: No results  for input(s): CHOL, HDL, LDLCALC, TRIG, CHOLHDL, LDLDIRECT in the last 72 hours. Thyroid Function Tests: No results for input(s): TSH, T4TOTAL, FREET4, T3FREE, THYROIDAB in the last 72 hours. Anemia Panel: No results for input(s): VITAMINB12, FOLATE, FERRITIN, TIBC, IRON, RETICCTPCT in the last 72 hours. Urine analysis:    Component Value Date/Time   COLORURINE YELLOW 03/01/2018 1553   APPEARANCEUR CLEAR 03/01/2018 1553   LABSPEC 1.014 03/01/2018 1553   PHURINE 7.0 03/01/2018 1553   GLUCOSEU NEGATIVE 03/01/2018 1553   GLUCOSEU NEGATIVE 02/13/2018 1036   HGBUR TRACE (A) 03/01/2018 1553   BILIRUBINUR Negative 01/24/2019 1444   KETONESUR NEGATIVE 03/01/2018 1553   PROTEINUR Negative 01/24/2019 1444   PROTEINUR NEGATIVE 03/01/2018 1553   UROBILINOGEN 0.2 01/24/2019 1444   UROBILINOGEN 0.2 02/13/2018 1036   NITRITE Negative 01/24/2019 1444   NITRITE NEGATIVE 03/01/2018 1553   LEUKOCYTESUR Negative 01/24/2019 1444   Sepsis Labs: !!!!!!!!!!!!!!!!!!!!!!!!!!!!!!!!!!!!!!!!!!!! _0 (procalcitonin:4,lacticidven:4) ) Recent Results (from the past 240 hour(s))  SARS Coronavirus 2 by RT PCR (hospital order, performed in [facility] hospital lab) Nasopharyngeal Nasopharyngeal Swab     Status: None   Collection Time: 06/23/20  1:39 PM   Specimen: Nasopharyngeal Swab  Result Value Ref Range Status   SARS Coronavirus 2 NEGATIVE NEGATIVE Final    Comment: (NOTE) SARS-CoV-2 target nucleic acids are NOT DETECTED.  The SARS-CoV-2 RNA is generally detectable in upper and lower respiratory specimens during the acute phase of infection. The lowest concentration of SARS-CoV-2 viral copies this assay can detect is 250 copies / mL. A negative result does not preclude SARS-CoV-2 infection and should not be used as the sole basis for treatment or other patient management decisions.  A negative result may occur with improper  specimen collection / handling, submission of specimen other than nasopharyngeal swab, presence of viral mutation(s) within the areas targeted by this assay, and inadequate number of viral copies (<250 copies / mL). A negative result must be combined with clinical observations, patient history, and epidemiological information.  Fact Sheet for Patients:   StrictlyIdeas.no  Fact Sheet for Healthcare Providers: BankingDealers.co.za  This test is not yet approved or  cleared by the [place] FDA and has been authorized for detection and/or diagnosis of SARS-CoV-2 by FDA under an Emergency Use Authorization (EUA).  This EUA will remain in effect (meaning this test can be used) for the duration of the COVID-19 declaration under Section 564(b)(1) of the Act, 21 U.S.C. section 360bbb-3(b)(1), unless the authorization is terminated or revoked sooner.  Performed at [facility], [place] [address] [place], [place] [address]      Radiological Exams on Admission: CT ABDOMEN PELVIS W CONTRAST  Result Date: 06/23/2020 CLINICAL DATA:  Weakness, fever, dehydration EXAM: CT ABDOMEN AND PELVIS WITH CONTRAST TECHNIQUE: Multidetector CT imaging of the abdomen and pelvis was performed using the standard protocol following bolus administration of intravenous contrast. CONTRAST:  166m OMNIPAQUE IOHEXOL 300 MG/ML  SOLN COMPARISON:  02/03/2018 FINDINGS: Lower chest: Stable subpleural scarring, with scattered tree in bud opacities at the bases compatible with chronic indolent infection such as MAC. Otherwise no acute pleural or parenchymal lung disease. Hepatobiliary: No focal liver abnormality is seen. No gallstones, gallbladder wall thickening, or biliary dilatation. Pancreas: Unremarkable. No pancreatic ductal dilatation or surrounding inflammatory changes. Spleen: Normal in size without focal abnormality. Adrenals/Urinary Tract: Adrenal glands  are unremarkable. Kidneys are normal, without renal calculi, focal lesion, or hydronephrosis. Bladder is unremarkable. Stomach/Bowel: No bowel obstruction or ileus. There is wall thickening  of the sigmoid colon, with surrounding pericolonic fat stranding. There is free gas within the adjacent mesentery, consistent with perforated sigmoid diverticulitis. There is no fluid collection or abscess. Normal appendix is seen in the right mid abdomen. Vascular/Lymphatic: Aortic atherosclerosis. No enlarged abdominal or pelvic lymph nodes. Reproductive: Uterus and right adnexa are unremarkable. There is a 3.4 cm simple appearing cyst within the left ovary, previous having measured 2.9 cm. Other: No free fluid. Free gas in the left lower quadrant adjacent to the sigmoid diverticulitis. No abdominal wall hernia. Musculoskeletal: No acute or destructive bony lesions. Severe osteoarthritis within the right hip. Postsurgical changes lower lumbar spine. Reconstructed images demonstrate no additional findings. IMPRESSION: 1. Acute perforated sigmoid diverticulitis, with pneumoperitoneum in the left lower quadrant. No fluid collection or abscess. 2. A 3.4 cm simple appearing left ovarian cyst, previous having measured 2.9 cm. Pelvic ultrasound recommended if not previously performed. This recommendation follows ACR consensus guidelines: White Paper of the ACR Incidental Findings Committee II on Adnexal Findings. J Am Coll Radiol 2013:10:675-681. 3. Aortic Atherosclerosis (ICD10-I70.0). These results were called by telephone at the time of interpretation on 06/23/2020 at 3:26 pm to provider Emeterio Reeve , who verbally acknowledged these results. Electronically Signed   By: Randa Ngo M.D.   On: 06/23/2020 15:33   DG Chest Portable 1 View  Result Date: 06/23/2020 CLINICAL DATA:  Nausea and weakness. EXAM: PORTABLE CHEST 1 VIEW COMPARISON:  06/09/1999 FINDINGS: Hypoventilation. Decreased lung volume with bibasilar atelectasis  left greater than right. Negative for heart failure. No pleural effusion. ACDF cervical spine. IMPRESSION: Hypoventilation with bibasilar atelectasis. Electronically Signed   By: Franchot Gallo M.D.   On: 06/23/2020 13:56    EKG: Independently reviewed. Sinus tach  Assessment/Plan Principal Problem:   Perforation of sigmoid colon due to diverticulitis Active Problems:   Rheumatoid arthritis ([place])   Long term current use of systemic steroids, for RA   DJD (degenerative joint disease), cervical   Sepsis ([place])   Immunosuppressed status ([place])   Pure hypercholesterolemia   1. Perforated sigmoid diverticulitis with sepsis present on admit 1. CT abd personally reviewed. Findings of acute perforated sigmoid diverticulitis with pneumoperitoneum in the LLQ. No abscess seen 2. Multiple drug allergies are listed (23 total) of varying severity. Patient reports some mild reactions such as upset stomach or "dizziness" listed 3. Pt tolerated rocephin and flagyl in the ED, will continue 4. Blood cx ordered, pending 5. General Surgery is following with recommendation for conservative management for now and if pt fails to improve, then may require exploration and partial colectomy 6. Will repeat cbc and cmp in AM 7. OK for ice chips and sips with meds per General Surgery 2. Rheumatoid Arthritis 1. Seems to be stable at this time 2. On chronic prednisone prior to admi 3. DJD 1. Seems to be stable currently 4. HLD 1. Cont statin as tolerated  DVT prophylaxis: Lovenox subq  Code Status: Full, confirmed with patient Family Communication: Pt in room, family not at bedside Disposition Plan: Unceratain at this time  Consults called: General Surgery Admission status: Inpatient as would require greater than 2 midnight stay for IV antibiotics for sepsis and perforated bowel given increased risk of morbidity and mortality   Marylu Lund MD [facility] Pager On Amion  If 7PM-7AM, please contact  night-coverage  06/23/2020, 4:54 PM

## 2020-06-23 NOTE — ED Notes (Signed)
Surgery PA at bedside.  

## 2020-06-23 NOTE — Consult Note (Addendum)
[facility] Surgery Consult Note  Eileen Wilkerson 10-31-1932  [id number].    Requesting MD: Emeterio Reeve PA-C Chief Complaint/Reason for Consult: diverticulitis with perforation  HPI:  Patient is an 86 year old female who presented to [facility] with weakness/fatigue. She lives at [facility]. Weakness, poor PO intake, and fatigue have progressively worsened over the last 2 days and this morning, when a nurse at [facility] came to check on her, patient was running a temp of 100.1. She has not been eating and has been drinking minimal liquids the last two days. She reports nausea and diarrhea x3 overnight. States her diarrhea was orange. Denies similar pain in the past. Denies a known history of diverticulosis/diverticulitis. Denies chills, chest pain, SOB, urinary symptoms, numbness, recent trauma. PMH significant for adrenal failure on chronic steroids, asthma, fibromyalgia, RA, and CHF. She is not on any blood thinning medications. No past abdominal surgery. She has had both COVID vaccines. Reports her husband had a history of colon cancer but she has no personal history of cancer/colon cancer. She stopped getting colonoscopies at age 76 y/o as recommended by her GI physician. Denies tobacco, alcohol, or drug use.   ROS: Review of Systems  All other systems reviewed and are negative.  Family History  Problem Relation Age of Onset  . Heart attack Maternal Grandmother   . Heart attack Paternal Grandfather   . Breast cancer Mother 72  . Diabetes Father   . Heart disease Father   . Congestive Heart Failure Father 67       Died from  . Allergic rhinitis Neg Hx   . Asthma Neg Hx   . Eczema Neg Hx   . Urticaria Neg Hx     Past Medical History:  Diagnosis Date  . Adrenal failure ([place])   . Arthritis   . Asthma   . Cancer ([place])   . Cataract   . Closed nondisplaced fracture of fifth right metatarsal bone 09/18/2017  . Fibromyalgia 2008  . HCAP (healthcare-associated  pneumonia) 02/03/2018  . Osteoporosis   . RA (rheumatoid arthritis) ([place])   . Recurrent upper respiratory infection (URI)   . Sepsis due to urinary tract infection ([place]) 01/18/2018  . Urticaria     Past Surgical History:  Procedure Laterality Date  . BACK SURGERY    . BILATERAL CARPAL TUNNEL RELEASE  2005   right and left  . CATARACT EXTRACTION, BILATERAL  2004   right and left  . CERVICAL FUSION  2011,2010,2008   2 disks  . HEEL SPUR SURGERY  2004  . lower back fusion  2011   Fusion of 3-4 and 4-5 lower back  . [place]  . ROTATOR CUFF REPAIR  [id number]  . SQUAMOUS CELL CARCINOMA EXCISION    . TONSILLECTOMY AND ADENOIDECTOMY  1947  . TOTAL SHOULDER ARTHROPLASTY      Social History:  reports that she quit smoking about 53 years ago. Her smoking use included cigarettes. She has a 7.50 pack-year smoking history. She has never used smokeless tobacco. She reports that she does not drink alcohol and does not use drugs.  Allergies:  Allergies  Allergen Reactions  . Adhesive [Tape] Rash  . Celebrex [Celecoxib] Hives  . Ciprofibrate Nausea Only  . Cymbalta [Duloxetine Hcl] Swelling  . Gabitril [Tiagabine] Swelling  . Keflex [Cephalexin] Nausea And Vomiting  . Lyrica [Pregabalin] Swelling  . Neurontin [Gabapentin] Swelling  . Nexium [Esomeprazole] Rash  . Nsaids Rash  . Penicillins  Rash    Injection site reaction. Tolerated cefepime in past Has patient had a PCN reaction causing immediate rash, facial/tongue/throat swelling, SOB or lightheadedness with hypotension: No Has patient had a PCN reaction causing severe rash involving mucus membranes or skin necrosis: No Has patient had a PCN reaction that required hospitalization No Has patient had a PCN reaction occurring within the last 10 years: No If all of the above answers are "NO", then may proceed with Cephalosporin use.   . Shrimp [Shellfish Allergy] Anaphylaxis    Per patient "shrimp only"  . Sulfa  Antibiotics Nausea And Vomiting  . Azactam [Aztreonam]     Hand swelling   . Azelastine Hcl     Rash   . Ciprofloxacin Other (See Comments)    dizziness  . Claritin [Loratadine]     Irritability Nervousness   . Methotrexate Derivatives   . Nasacort [Triamcinolone]     Dizzy   . Olopatadine Other (See Comments)    Pain and lethargy   . Sulfamethizole   . Zantac [Ranitidine Hcl]   . Claritin-D 12 Hour [Loratadine-Pseudoephedrine Er] Anxiety    (Not in a hospital admission)   Blood pressure 130/70, pulse 94, temperature 98.9 F (37.2 C), temperature source Oral, resp. rate 17, height _0  (1.626 m), weight 78 kg, SpO2 97 %. Physical Exam:  General: pleasant, WD, WN white female who is laying in bed in NAD HEENT: head is normocephalic, atraumatic.  Sclera are noninjected.  PERRL.  Ears and nose without any masses or lesions.  Mouth is pink and moist Heart: regular, rate, and rhythm. No obvious murmurs, gallops, or rubs noted.  Palpable radial and pedal pulses bilaterally Lungs: CTAB, no wheezes, rhonchi, or rales noted.  Respiratory effort nonlabored Abd: soft, mild distention, tenderness to light palpation of LLQ without peritonitis,  Hypoactive BS, no masses, hernias, or organomegaly MS: all 4 extremities are symmetrical with no cyanosis, clubbing, or edema. Skin: warm and dry with no masses, lesions, or rashes Neuro: Cranial nerves 2-12 grossly intact, sensation grossly intact throughout Psych: A&Ox3 with an appropriate affect.   Results for orders placed or performed during the hospital encounter of 06/23/20 (from the past 48 hour(s))  Comprehensive metabolic panel     Status: Abnormal   Collection Time: 06/23/20  1:23 PM  Result Value Ref Range   Sodium 139 135 - 145 mmol/L   Potassium 3.5 3.5 - 5.1 mmol/L   Chloride 96 (L) 98 - 111 mmol/L   CO2 27 22 - 32 mmol/L   Glucose, Bld 108 (H) 70 - 99 mg/dL    Comment: Glucose reference range applies only to samples taken after  fasting for at least 8 hours.   BUN 11 8 - 23 mg/dL   Creatinine, Ser 0.95 0.44 - 1.00 mg/dL   Calcium 9.0 8.9 - 10.3 mg/dL   Total Protein 7.3 6.5 - 8.1 g/dL   Albumin 3.3 (L) 3.5 - 5.0 g/dL   AST 18 15 - 41 U/L   ALT 15 0 - 44 U/L   Alkaline Phosphatase 49 38 - 126 U/L   Total Bilirubin 1.2 0.3 - 1.2 mg/dL   GFR calc non Af Amer 55 (L) >60 mL/min   GFR calc Af Amer >60 >60 mL/min   Anion gap 16 (H) 5 - 15    Comment: Performed at [facility], [place] [address], [place], [place] [address]  Lipase, blood     Status: None   Collection Time:  06/23/20  1:23 PM  Result Value Ref Range   Lipase 21 11 - 51 U/L    Comment: Performed at [facility], [place] [address], [place], [place] [address]  Lactic acid, plasma     Status: None   Collection Time: 06/23/20  1:23 PM  Result Value Ref Range   Lactic Acid, Venous 1.5 0.5 - 1.9 mmol/L    Comment: Performed at [facility], [place] [address] [place], [place] [address]  CBC with Differential     Status: Abnormal   Collection Time: 06/23/20  1:23 PM  Result Value Ref Range   WBC 17.3 (H) 4.0 - 10.5 K/uL   RBC 4.23 3.87 - 5.11 MIL/uL   Hemoglobin 13.6 12.0 - 15.0 g/dL   HCT 42.4 36 - 46 %   MCV 100.2 (H) 80.0 - 100.0 fL   MCH 32.2 26.0 - 34.0 pg   MCHC 32.1 30.0 - 36.0 g/dL   RDW 13.7 11.5 - 15.5 %   Platelets 272 150 - 400 K/uL   nRBC 0.0 0.0 - 0.2 %   Neutrophils Relative % 73 %   Neutro Abs 12.8 (H) 1.7 - 7.7 K/uL   Lymphocytes Relative 14 %   Lymphs Abs 2.5 0.7 - 4.0 K/uL   Monocytes Relative 11 %   Monocytes Absolute 1.9 (H) 0 - 1 K/uL   Eosinophils Relative 0 %   Eosinophils Absolute 0.1 0 - 0 K/uL   Basophils Relative 1 %   Basophils Absolute 0.1 0 - 0 K/uL   Immature Granulocytes 1 %   Abs Immature Granulocytes 0.09 (H) 0.00 - 0.07 K/uL    Comment: Performed at [facility], [place] [address] [place], [place] [address]  SARS Coronavirus 2 by RT PCR  (hospital order, performed in [facility] hospital lab) Nasopharyngeal Nasopharyngeal Swab     Status: None   Collection Time: 06/23/20  1:39 PM   Specimen: Nasopharyngeal Swab  Result Value Ref Range   SARS Coronavirus 2 NEGATIVE NEGATIVE    Comment: (NOTE) SARS-CoV-2 target nucleic acids are NOT DETECTED.  The SARS-CoV-2 RNA is generally detectable in upper and lower respiratory specimens during the acute phase of infection. The lowest concentration of SARS-CoV-2 viral copies this assay can detect is 250 copies / mL. A negative result does not preclude SARS-CoV-2 infection and should not be used as the sole basis for treatment or other patient management decisions.  A negative result may occur with improper specimen collection / handling, submission of specimen other than nasopharyngeal swab, presence of viral mutation(s) within the areas targeted by this assay, and inadequate number of viral copies (<250 copies / mL). A negative result must be combined with clinical observations, patient history, and epidemiological information.  Fact Sheet for Patients:   StrictlyIdeas.no  Fact Sheet for Healthcare Providers: BankingDealers.co.za  This test is not yet approved or  cleared by the [place] FDA and has been authorized for detection and/or diagnosis of SARS-CoV-2 by FDA under an Emergency Use Authorization (EUA).  This EUA will remain in effect (meaning this test can be used) for the duration of the COVID-19 declaration under Section 564(b)(1) of the Act, 21 U.S.C. section 360bbb-3(b)(1), unless the authorization is terminated or revoked sooner.  Performed at [facility], [place] [address], [place],  [address]    CT ABDOMEN PELVIS W CONTRAST  Result Date: 06/23/2020 CLINICAL DATA:  Weakness, fever, dehydration EXAM: CT ABDOMEN AND PELVIS WITH  CONTRAST TECHNIQUE: Multidetector CT imaging of the abdomen  and pelvis was performed using the standard protocol following bolus administration of intravenous contrast. CONTRAST:  175m OMNIPAQUE IOHEXOL 300 MG/ML  SOLN COMPARISON:  02/03/2018 FINDINGS: Lower chest: Stable subpleural scarring, with scattered tree in bud opacities at the bases compatible with chronic indolent infection such as MAC. Otherwise no acute pleural or parenchymal lung disease. Hepatobiliary: No focal liver abnormality is seen. No gallstones, gallbladder wall thickening, or biliary dilatation. Pancreas: Unremarkable. No pancreatic ductal dilatation or surrounding inflammatory changes. Spleen: Normal in size without focal abnormality. Adrenals/Urinary Tract: Adrenal glands are unremarkable. Kidneys are normal, without renal calculi, focal lesion, or hydronephrosis. Bladder is unremarkable. Stomach/Bowel: No bowel obstruction or ileus. There is wall thickening of the sigmoid colon, with surrounding pericolonic fat stranding. There is free gas within the adjacent mesentery, consistent with perforated sigmoid diverticulitis. There is no fluid collection or abscess. Normal appendix is seen in the right mid abdomen. Vascular/Lymphatic: Aortic atherosclerosis. No enlarged abdominal or pelvic lymph nodes. Reproductive: Uterus and right adnexa are unremarkable. There is a 3.4 cm simple appearing cyst within the left ovary, previous having measured 2.9 cm. Other: No free fluid. Free gas in the left lower quadrant adjacent to the sigmoid diverticulitis. No abdominal wall hernia. Musculoskeletal: No acute or destructive bony lesions. Severe osteoarthritis within the right hip. Postsurgical changes lower lumbar spine. Reconstructed images demonstrate no additional findings. IMPRESSION: 1. Acute perforated sigmoid diverticulitis, with pneumoperitoneum in the left lower quadrant. No fluid collection or abscess. 2. A 3.4 cm simple appearing left ovarian cyst, previous having measured 2.9 cm. Pelvic ultrasound  recommended if not previously performed. This recommendation follows ACR consensus guidelines: White Paper of the ACR Incidental Findings Committee II on Adnexal Findings. J Am Coll Radiol 2013:10:675-681. 3. Aortic Atherosclerosis (ICD10-I70.0). These results were called by telephone at the time of interpretation on 06/23/2020 at 3:26 pm to provider KEmeterio Reeve, who verbally acknowledged these results. Electronically Signed   By: MRanda NgoM.D.   On: 06/23/2020 15:33   DG Chest Portable 1 View  Result Date: 06/23/2020 CLINICAL DATA:  Nausea and weakness. EXAM: PORTABLE CHEST 1 VIEW COMPARISON:  06/09/1999 FINDINGS: Hypoventilation. Decreased lung volume with bibasilar atelectasis left greater than right. Negative for heart failure. No pleural effusion. ACDF cervical spine. IMPRESSION: Hypoventilation with bibasilar atelectasis. Electronically Signed   By: CFranchot GalloM.D.   On: 06/23/2020 13:56      Assessment/Plan Adrenal failure on chronic steroids Asthma Fibromyalgia RA CHF 3.4 cm L ovarian cyst - seen incidentally on CT   Sigmoid diverticulitis with perforation - CT scan shows inflammation of sigmoid colon with pneumoperitoneum in LLQ but no abscess or fluid collection - WBC 17, afebrile here - clinically patient has left lower quadrant tenderness without guarding or peritonitis. She has had some sinus tachycardia but her blood pressure and respiratory rate are WNL.  - recommend abx and bowel rest, ok to have ice chips for comfort and sips with meds - pt has multiple allergies listed to medications, if unable to have zosyn would recommend cipro/flagyl, 3rd line would consider meropenem   - Do not recommend emergent surgery at this time. Hopefully this will resolve with conservative management, if patient fails to improve may require exploration and partial colectomy with possible colostomy  Admit to internal medicine and surgery will follow   EObie Dredge  PM[facility] 06/23/2020, 3:55 PM Please see Amion for pager number during day hours 7:00am-4:30pm

## 2020-06-24 LAB — BASIC METABOLIC PANEL
Anion gap: 13 (ref 5–15)
BUN: 10 mg/dL (ref 8–23)
CO2: 25 mmol/L (ref 22–32)
Calcium: 8.4 mg/dL — ABNORMAL LOW (ref 8.9–10.3)
Chloride: 106 mmol/L (ref 98–111)
Creatinine, Ser: 0.79 mg/dL (ref 0.44–1.00)
GFR calc Af Amer: >60 mL/min (ref >60)
GFR calc non Af Amer: >60 mL/min (ref >60)
Glucose, Bld: 76 mg/dL (ref 70–99)
Potassium: 3.2 mmol/L — ABNORMAL LOW (ref 3.5–5.1)
Sodium: 144 mmol/L (ref 135–145)

## 2020-06-24 LAB — URINALYSIS, ROUTINE W REFLEX MICROSCOPIC
Bilirubin Urine: NEGATIVE
Glucose, UA: NEGATIVE mg/dL
Ketones, ur: 80 mg/dL — AB
Nitrite: NEGATIVE
Protein, ur: 30 mg/dL — AB
Specific Gravity, Urine: >1.046 — ABNORMAL HIGH (ref 1.005–1.030)
pH: 6 (ref 5.0–8.0)

## 2020-06-24 LAB — CBC
HCT: 46 % (ref 36.0–46.0)
Hemoglobin: 14.3 g/dL (ref 12.0–15.0)
MCH: 31.4 pg (ref 26.0–34.0)
MCHC: 31.1 g/dL (ref 30.0–36.0)
MCV: 100.9 fL — ABNORMAL HIGH (ref 80.0–100.0)
Platelets: 208 10*3/uL (ref 150–400)
RBC: 4.56 MIL/uL (ref 3.87–5.11)
RDW: 13.8 % (ref 11.5–15.5)
WBC: 12.9 10*3/uL — ABNORMAL HIGH (ref 4.0–10.5)
nRBC: 0 % (ref 0.0–0.2)

## 2020-06-24 MED ORDER — ONDANSETRON HCL 4 MG/2ML IJ SOLN: 4.0000 mg | Freq: Four times a day (QID) | INTRAMUSCULAR | Status: DC

## 2020-06-24 MED ORDER — ONDANSETRON HCL 4 MG/2ML IJ SOLN
4.0000 mg | Freq: Four times a day (QID) | INTRAMUSCULAR | Status: DC | PRN
  Administered 2020-06-24 – 2020-06-25 (×2): 4 mg via INTRAVENOUS
  Filled 2020-06-24 (×2): qty 2

## 2020-06-24 MED ORDER — ENOXAPARIN SODIUM 40 MG/0.4ML ~~LOC~~ SOLN
40.0000 mg | SUBCUTANEOUS | Status: DC
  Administered 2020-06-24 – 2020-06-29 (×6): 40 mg via SUBCUTANEOUS
  Filled 2020-06-24 (×6): qty 0.4

## 2020-06-24 MED ORDER — PANTOPRAZOLE SODIUM 40 MG PO TBEC
40.0000 mg | DELAYED_RELEASE_TABLET | Freq: Every day | ORAL | Status: DC
  Administered 2020-06-25 – 2020-06-29 (×5): 40 mg via ORAL
  Filled 2020-06-24 (×5): qty 1

## 2020-06-24 NOTE — Progress Notes (Signed)
PROGRESS NOTE    Eileen Wilkerson  FKC:[id number] DOB: Nov 11, 1932 DOA: 06/23/2020 PCP: [name], MD   Chef Complaints: Abdominal pain x2 to 3 days  Brief Narrative: 86 y.o. female with medical history significant of RA, hypothyroid, asthma who presents with abd pain over the past 2-3 days. Patient reports initially having decreased appetite on 7/12 with no other notable symptoms. On 7/13, patient experienced marked and persistent abd pain across the abdomen. Symptoms persisted overnight and patient subsequently presented to the ED for further work up. Denies chest pain or sob. Did report a temp as high as 100.1 prior to visit. Pt also reported several bouts of diarrhea associated with abd discomfort  ED Course: In the ED, pt was found to be afebrile. Presenting HR was up to 109 bpm and RR as high as 22. WBC noted to be over 17k with CT abd/pelvis demonstrating acute sigmoid diverticulitis with pneumoperitoneum, reviewed.  General surgery was consulted and patient was admitted for further management.     Subjective:  Still c/o abdominal pain on left side, no flatus. Had nausea this am Alert,awake Not in acute distress.   Assessment & Plan:  Sigmoid colon diverticulitis with perforation and sepsis POA: Appreciate surgery input.  Continue current conservative management bowel rest/n.p.o., IV fluids, pain management, nausea management, ceftriaxone and Flagyl IV antibiotics.  Follow-up blood culture, WBC downtrending check CBC in a.m. Recent Labs  Lab 06/23/20 1323 06/24/20 0501  WBC 17.3* 12.9*   Rheumatoid arthritis stable at this time on chronic prednisone 10 MG DAILY and on Prevacid at home- cont ppi and prednisone.  DJD, cervical-stable  Pure hypercholesterolemia: Continue statin.  On lans  DVT prophylaxis: enoxaparin (LOVENOX) injection 40 mg Start: 06/24/20 0800 Code Status: Full code Family Communication: plan of care discussed with patient at bedside.  Status  is: Inpatient  Remains inpatient appropriate because:IV treatments appropriate due to intensity of illness or inability to take PO and Inpatient level of care appropriate due to severity of illness  Remains hospitalized for ongoing IV antibiotics with close follow-up on some diverticulitis   Dispo: The patient is from: Home              Anticipated d/c is to: Home              Anticipated d/c date is: 2 days              Patient currently is not medically stable to d/c.   Nutrition: Diet Order            Diet NPO time specified Except for: Ice Chips, Sips with Meds  Diet effective now                       Body mass index is 29.52 kg/m.  Consultants:see note  Procedures:see note Microbiology:see note  Medications: Scheduled Meds: . atorvastatin  20 mg Oral Daily  . cycloSPORINE  1 drop Both Eyes BID  . enoxaparin (LOVENOX) injection  40 mg Subcutaneous Q24H  . fluticasone  2 spray Each Nare Daily  . montelukast  10 mg Oral QHS  . predniSONE  10 mg Oral Q breakfast   Continuous Infusions: . sodium chloride 100 mL/hr at 06/24/20 0640  . cefTRIAXone (ROCEPHIN)  IV    . metronidazole 500 mg (06/24/20 0744)    Antimicrobials: Anti-infectives (From admission, onward)   Start     Dose/Rate Route Frequency Ordered Stop   06/24/20 1000  cefTRIAXone (  ROCEPHIN) 2 g in sodium chloride 0.9 % 100 mL IVPB     Discontinue     2 g 200 mL/hr over 30 Minutes Intravenous Every 24 hours 06/23/20 1703     06/24/20 0000  metroNIDAZOLE (FLAGYL) IVPB 500 mg     Discontinue     500 mg 100 mL/hr over 60 Minutes Intravenous Every 8 hours 06/23/20 1703     06/23/20 1445  cefTRIAXone (ROCEPHIN) 2 g in sodium chloride 0.9 % 100 mL IVPB        2 g 200 mL/hr over 30 Minutes Intravenous  Once 06/23/20 1432 06/23/20 1629   06/23/20 1430  levofloxacin (LEVAQUIN) IVPB 750 mg  Status:  Discontinued        750 mg 100 mL/hr over 90 Minutes Intravenous  Once 06/23/20 1423 06/23/20 1432   06/23/20  1430  metroNIDAZOLE (FLAGYL) IVPB 500 mg        500 mg 100 mL/hr over 60 Minutes Intravenous  Once 06/23/20 1423 06/23/20 1732       Objective: Vitals: Today's Vitals   06/24/20 0148 06/24/20 0518 06/24/20 0641 06/24/20 0805  BP: 137/62 132/66    Pulse: (!) 101 98    Resp: 18 18    Temp: 97.8 F (36.6 C) 98.7 F (37.1 C)    TempSrc:      SpO2: 98% 98%    Weight:      Height:      PainSc:   5  0-No pain    Intake/Output Summary (Last 24 hours) at 06/24/2020 0812 Last data filed at 06/24/2020 0600 Gross per 24 hour  Intake 976.79 ml  Output -  Net 976.79 ml   Filed Weights   06/23/20 1322  Weight: 78 kg   Weight change:    Intake/Output from previous day: 07/14 0701 - 07/15 0700 In: 976.8 [P.O.:50; I.V.:826.8; IV Piggyback:100] Out: -  Intake/Output this shift: No intake/output data recorded.  Examination:  General exam: AAOx3, elderly,NAD, weak appearing. HEENT:Oral mucosa moist, Ear/Nose WNL grossly,dentition normal. Respiratory system: bilaterally clear,no wheezing or crackles,no use of accessory muscle, non tender. Cardiovascular system: S1 & S2 +, regular, No JVD. Gastrointestinal system:  LLQ Tender with voluntary guarding, soft, BS+. Nervous System:Alert, awake, moving extremities and grossly nonfocal Extremities: mild edema, chronic skin changes, distal peripheral pulses palpable.  Skin: No rashes,no icterus. MSK: Normal muscle bulk,tone, power  Data Reviewed: I have personally reviewed following labs and imaging studies CBC: Recent Labs  Lab 06/23/20 1323 06/24/20 0501  WBC 17.3* 12.9*  NEUTROABS 12.8*  --   HGB 13.6 14.3  HCT 42.4 46.0  MCV 100.2* 100.9*  PLT 272 697   Basic Metabolic Panel: Recent Labs  Lab 06/23/20 1323 06/24/20 0501  NA 139 144  K 3.5 3.2*  CL 96* 106  CO2 27 25  GLUCOSE 108* 76  BUN 11 10  CREATININE 0.95 0.79  CALCIUM 9.0 8.4*   GFR: Estimated Creatinine Clearance: 51.9 mL/min (by C-G formula based on SCr  of 0.79 mg/dL). Liver Function Tests: Recent Labs  Lab 06/23/20 1323  AST 18  ALT 15  ALKPHOS 49  BILITOT 1.2  PROT 7.3  ALBUMIN 3.3*   Recent Labs  Lab 06/23/20 1323  LIPASE 21   No results for input(s): AMMONIA in the last 168 hours. Coagulation Profile: No results for input(s): INR, PROTIME in the last 168 hours. Cardiac Enzymes: No results for input(s): CKTOTAL, CKMB, CKMBINDEX, TROPONINI in the last 168 hours. BNP (last 3  results) No results for input(s): PROBNP in the last 8760 hours. HbA1C: No results for input(s): HGBA1C in the last 72 hours. CBG: No results for input(s): GLUCAP in the last 168 hours. Lipid Profile: No results for input(s): CHOL, HDL, LDLCALC, TRIG, CHOLHDL, LDLDIRECT in the last 72 hours. Thyroid Function Tests: No results for input(s): TSH, T4TOTAL, FREET4, T3FREE, THYROIDAB in the last 72 hours. Anemia Panel: No results for input(s): VITAMINB12, FOLATE, FERRITIN, TIBC, IRON, RETICCTPCT in the last 72 hours. Sepsis Labs: Recent Labs  Lab 06/23/20 1323  LATICACIDVEN 1.5    Recent Results (from the past 240 hour(s))  SARS Coronavirus 2 by RT PCR (hospital order, performed in [facility] hospital lab) Nasopharyngeal Nasopharyngeal Swab     Status: None   Collection Time: 06/23/20  1:39 PM   Specimen: Nasopharyngeal Swab  Result Value Ref Range Status   SARS Coronavirus 2 NEGATIVE NEGATIVE Final    Comment: (NOTE) SARS-CoV-2 target nucleic acids are NOT DETECTED.  The SARS-CoV-2 RNA is generally detectable in upper and lower respiratory specimens during the acute phase of infection. The lowest concentration of SARS-CoV-2 viral copies this assay can detect is 250 copies / mL. A negative result does not preclude SARS-CoV-2 infection and should not be used as the sole basis for treatment or other patient management decisions.  A negative result may occur with improper specimen collection / handling, submission of specimen other than  nasopharyngeal swab, presence of viral mutation(s) within the areas targeted by this assay, and inadequate number of viral copies (<250 copies / mL). A negative result must be combined with clinical observations, patient history, and epidemiological information.  Fact Sheet for Patients:   StrictlyIdeas.no  Fact Sheet for Healthcare Providers: BankingDealers.co.za  This test is not yet approved or  cleared by the [place] FDA and has been authorized for detection and/or diagnosis of SARS-CoV-2 by FDA under an Emergency Use Authorization (EUA).  This EUA will remain in effect (meaning this test can be used) for the duration of the COVID-19 declaration under Section 564(b)(1) of the Act, 21 U.S.C. section 360bbb-3(b)(1), unless the authorization is terminated or revoked sooner.  Performed at [facility], [place] [address], [place], [place] [address]       Radiology Studies: CT ABDOMEN PELVIS W CONTRAST  Result Date: 06/23/2020 CLINICAL DATA:  Weakness, fever, dehydration EXAM: CT ABDOMEN AND PELVIS WITH CONTRAST TECHNIQUE: Multidetector CT imaging of the abdomen and pelvis was performed using the standard protocol following bolus administration of intravenous contrast. CONTRAST:  142m OMNIPAQUE IOHEXOL 300 MG/ML  SOLN COMPARISON:  02/03/2018 FINDINGS: Lower chest: Stable subpleural scarring, with scattered tree in bud opacities at the bases compatible with chronic indolent infection such as MAC. Otherwise no acute pleural or parenchymal lung disease. Hepatobiliary: No focal liver abnormality is seen. No gallstones, gallbladder wall thickening, or biliary dilatation. Pancreas: Unremarkable. No pancreatic ductal dilatation or surrounding inflammatory changes. Spleen: Normal in size without focal abnormality. Adrenals/Urinary Tract: Adrenal glands are unremarkable. Kidneys are normal, without renal calculi, focal lesion, or  hydronephrosis. Bladder is unremarkable. Stomach/Bowel: No bowel obstruction or ileus. There is wall thickening of the sigmoid colon, with surrounding pericolonic fat stranding. There is free gas within the adjacent mesentery, consistent with perforated sigmoid diverticulitis. There is no fluid collection or abscess. Normal appendix is seen in the right mid abdomen. Vascular/Lymphatic: Aortic atherosclerosis. No enlarged abdominal or pelvic lymph nodes. Reproductive: Uterus and right adnexa are unremarkable. There is a 3.4 cm simple appearing  cyst within the left ovary, previous having measured 2.9 cm. Other: No free fluid. Free gas in the left lower quadrant adjacent to the sigmoid diverticulitis. No abdominal wall hernia. Musculoskeletal: No acute or destructive bony lesions. Severe osteoarthritis within the right hip. Postsurgical changes lower lumbar spine. Reconstructed images demonstrate no additional findings. IMPRESSION: 1. Acute perforated sigmoid diverticulitis, with pneumoperitoneum in the left lower quadrant. No fluid collection or abscess. 2. A 3.4 cm simple appearing left ovarian cyst, previous having measured 2.9 cm. Pelvic ultrasound recommended if not previously performed. This recommendation follows ACR consensus guidelines: White Paper of the ACR Incidental Findings Committee II on Adnexal Findings. J Am Coll Radiol 2013:10:675-681. 3. Aortic Atherosclerosis (ICD10-I70.0). These results were called by telephone at the time of interpretation on 06/23/2020 at 3:26 pm to provider Emeterio Reeve , who verbally acknowledged these results. Electronically Signed   By: Randa Ngo M.D.   On: 06/23/2020 15:33   DG Chest Portable 1 View  Result Date: 06/23/2020 CLINICAL DATA:  Nausea and weakness. EXAM: PORTABLE CHEST 1 VIEW COMPARISON:  06/09/1999 FINDINGS: Hypoventilation. Decreased lung volume with bibasilar atelectasis left greater than right. Negative for heart failure. No pleural effusion.  ACDF cervical spine. IMPRESSION: Hypoventilation with bibasilar atelectasis. Electronically Signed   By: Franchot Gallo M.D.   On: 06/23/2020 13:56     LOS: 1 day   Antonieta Pert, MD [facility]  06/24/2020, 8:12 AM

## 2020-06-24 NOTE — Progress Notes (Signed)
Progress Note: General Surgery Service   Chief Complaint/Subjective: Some nausea this morning, pain controlled, no flatus or BMs  Objective: Vital signs in last 24 hours: Temp:  [97.8 F (36.6 C)-99.1 F (37.3 C)] 98.7 F (37.1 C) (07/15 0518) Pulse Rate:  [92-109] 98 (07/15 0518) Resp:  [17-20] 18 (07/15 0518) BP: (125-144)/(54-70) 132/66 (07/15 0518) SpO2:  [93 %-100 %] 98 % (07/15 0518) Weight:  [78 kg] 78 kg (07/14 1322)    Intake/Output from previous day: 07/14 0701 - 07/15 0700 In: 976.8 [P.O.:50; I.V.:826.8; IV Piggyback:100] Out: -  Intake/Output this shift: No intake/output data recorded.  Gen: NAD  Resp: nonlabored  Card: RRR  Abd: soft, slight tenderness lower abdomen, no guarding  Lab Results: CBC  Recent Labs    06/23/20 1323 06/24/20 0501  WBC 17.3* 12.9*  HGB 13.6 14.3  HCT 42.4 46.0  PLT 272 208   BMET Recent Labs    06/23/20 1323 06/24/20 0501  NA 139 144  K 3.5 3.2*  CL 96* 106  CO2 27 25  GLUCOSE 108* 76  BUN 11 10  CREATININE 0.95 0.79  CALCIUM 9.0 8.4*   PT/INR No results for input(s): LABPROT, INR in the last 72 hours. ABG No results for input(s): PHART, HCO3 in the last 72 hours.  Invalid input(s): PCO2, PO2  Anti-infectives: Anti-infectives (From admission, onward)   Start     Dose/Rate Route Frequency Ordered Stop   06/24/20 1000  cefTRIAXone (ROCEPHIN) 2 g in sodium chloride 0.9 % 100 mL IVPB     Discontinue     2 g 200 mL/hr over 30 Minutes Intravenous Every 24 hours 06/23/20 1703     06/24/20 0000  metroNIDAZOLE (FLAGYL) IVPB 500 mg     Discontinue     500 mg 100 mL/hr over 60 Minutes Intravenous Every 8 hours 06/23/20 1703     06/23/20 1445  cefTRIAXone (ROCEPHIN) 2 g in sodium chloride 0.9 % 100 mL IVPB        2 g 200 mL/hr over 30 Minutes Intravenous  Once 06/23/20 1432 06/23/20 1629   06/23/20 1430  levofloxacin (LEVAQUIN) IVPB 750 mg  Status:  Discontinued        750 mg 100 mL/hr over 90 Minutes  Intravenous  Once 06/23/20 1423 06/23/20 1432   06/23/20 1430  metroNIDAZOLE (FLAGYL) IVPB 500 mg        500 mg 100 mL/hr over 60 Minutes Intravenous  Once 06/23/20 1423 06/23/20 1732      Medications: Scheduled Meds: . atorvastatin  20 mg Oral Daily  . cycloSPORINE  1 drop Both Eyes BID  . enoxaparin (LOVENOX) injection  40 mg Subcutaneous Q24H  . fluticasone  2 spray Each Nare Daily  . montelukast  10 mg Oral QHS  . predniSONE  10 mg Oral Q breakfast   Continuous Infusions: . sodium chloride 100 mL/hr at 06/24/20 0640  . cefTRIAXone (ROCEPHIN)  IV    . metronidazole 500 mg (06/24/20 0744)   PRN Meds:.morphine injection, ondansetron (ZOFRAN) IV  Assessment/Plan: Diverticulitis Henchey III, WBC downtrending today and patient less tender. Nausea today -continue bowel rest -continue abx   LOS: 1 day   Mickeal Skinner, MD [facility]

## 2020-06-25 ENCOUNTER — Other Ambulatory Visit: Payer: Self-pay | Admitting: Internal Medicine

## 2020-06-25 LAB — URINE CULTURE: Culture: NO GROWTH

## 2020-06-25 LAB — CBC WITH DIFFERENTIAL/PLATELET
Abs Immature Granulocytes: 0.03 10*3/uL (ref 0.00–0.07)
Basophils Absolute: 0.1 10*3/uL (ref 0.0–0.1)
Basophils Relative: 1 %
Eosinophils Absolute: 0.2 10*3/uL (ref 0.0–0.5)
Eosinophils Relative: 1 %
HCT: 37.1 % (ref 36.0–46.0)
Hemoglobin: 11.5 g/dL — ABNORMAL LOW (ref 12.0–15.0)
Immature Granulocytes: 0 %
Lymphocytes Relative: 17 %
Lymphs Abs: 1.9 10*3/uL (ref 0.7–4.0)
MCH: 31.7 pg (ref 26.0–34.0)
MCHC: 31 g/dL (ref 30.0–36.0)
MCV: 102.2 fL — ABNORMAL HIGH (ref 80.0–100.0)
Monocytes Absolute: 1 10*3/uL (ref 0.1–1.0)
Monocytes Relative: 10 %
Neutro Abs: 7.8 10*3/uL — ABNORMAL HIGH (ref 1.7–7.7)
Neutrophils Relative %: 71 %
Platelets: 256 10*3/uL (ref 150–400)
RBC: 3.63 MIL/uL — ABNORMAL LOW (ref 3.87–5.11)
RDW: 13.4 % (ref 11.5–15.5)
WBC: 10.9 10*3/uL — ABNORMAL HIGH (ref 4.0–10.5)
nRBC: 0 % (ref 0.0–0.2)

## 2020-06-25 LAB — COMPREHENSIVE METABOLIC PANEL
ALT: 14 U/L (ref 0–44)
AST: 20 U/L (ref 15–41)
Albumin: 2.7 g/dL — ABNORMAL LOW (ref 3.5–5.0)
Alkaline Phosphatase: 43 U/L (ref 38–126)
Anion gap: 11 (ref 5–15)
BUN: 10 mg/dL (ref 8–23)
CO2: 22 mmol/L (ref 22–32)
Calcium: 8.2 mg/dL — ABNORMAL LOW (ref 8.9–10.3)
Chloride: 111 mmol/L (ref 98–111)
Creatinine, Ser: 0.66 mg/dL (ref 0.44–1.00)
GFR calc Af Amer: >60 mL/min (ref >60)
GFR calc non Af Amer: >60 mL/min (ref >60)
Glucose, Bld: 78 mg/dL (ref 70–99)
Potassium: 3.1 mmol/L — ABNORMAL LOW (ref 3.5–5.1)
Sodium: 144 mmol/L (ref 135–145)
Total Bilirubin: 1.1 mg/dL (ref 0.3–1.2)
Total Protein: 6.3 g/dL — ABNORMAL LOW (ref 6.5–8.1)

## 2020-06-25 MED ORDER — MORPHINE SULFATE (PF) 4 MG/ML IV SOLN: 4.0000 mg | INTRAVENOUS | Status: DC | PRN

## 2020-06-25 MED ORDER — POTASSIUM CHLORIDE 10 MEQ/100ML IV SOLN
10.0000 meq | INTRAVENOUS | Status: AC
  Administered 2020-06-25 (×4): 10 meq via INTRAVENOUS
  Filled 2020-06-25: qty 100

## 2020-06-25 MED ORDER — POTASSIUM CHLORIDE IN NACL 40-0.9 MEQ/L-% IV SOLN
INTRAVENOUS | Status: DC
  Filled 2020-06-25 (×4): qty 1000

## 2020-06-25 MED ORDER — ACETAMINOPHEN 500 MG PO TABS
1000.0000 mg | ORAL_TABLET | Freq: Four times a day (QID) | ORAL | Status: DC
  Administered 2020-06-25 – 2020-06-29 (×13): 1000 mg via ORAL
  Filled 2020-06-25 (×16): qty 2

## 2020-06-25 MED ORDER — MORPHINE SULFATE (PF) 2 MG/ML IV SOLN
1.0000 mg | INTRAVENOUS | Status: DC | PRN
  Administered 2020-06-25 – 2020-06-28 (×6): 2 mg via INTRAVENOUS
  Filled 2020-06-25 (×7): qty 1

## 2020-06-25 MED ORDER — ALPRAZOLAM 0.25 MG PO TABS
0.2500 mg | ORAL_TABLET | Freq: Three times a day (TID) | ORAL | Status: DC | PRN
  Administered 2020-06-26: 0.25 mg via ORAL
  Filled 2020-06-25: qty 1

## 2020-06-25 MED ORDER — SODIUM CHLORIDE 0.9 % IV SOLN
1.0000 g | INTRAVENOUS | Status: DC
  Administered 2020-06-25 – 2020-06-28 (×4): 1000 mg via INTRAVENOUS
  Filled 2020-06-25 (×5): qty 1

## 2020-06-25 NOTE — Progress Notes (Signed)
Subjective: Patient still with pain today.  States that the pain medication is not helping her.  She is hungry though and wants food.  She is not mobilizing much either.  ROS: See above, otherwise other systems negative  Objective: Vital signs in last 24 hours: Temp:  [98.3 F (36.8 C)-98.5 F (36.9 C)] 98.3 F (36.8 C) (07/16 0628) Pulse Rate:  [73-82] 82 (07/16 0628) Resp:  [14-18] 14 (07/16 0628) BP: (122-142)/(61-67) 142/67 (07/16 0628) SpO2:  [94 %-99 %] 94 % (07/16 0628)    Intake/Output from previous day: 07/15 0701 - 07/16 0700 In: 2169.9 [P.O.:240; I.V.:1529.8; IV Piggyback:400.1] Out: 1400 [Urine:1400] Intake/Output this shift: No intake/output data recorded.  PE: Gen: NAD Abd: soft, tender across the lower portion of her abdomen.  Most in the LLQ, but some in the RLQ.  A bit protuberant in her lower abdomen but doesn't seem tympanitic.  +BS  Lab Results:  Recent Labs    06/24/20 0501 06/25/20 0849  WBC 12.9* 10.9*  HGB 14.3 11.5*  HCT 46.0 37.1  PLT 208 256   BMET Recent Labs    06/23/20 1323 06/24/20 0501  NA 139 144  K 3.5 3.2*  CL 96* 106  CO2 27 25  GLUCOSE 108* 76  BUN 11 10  CREATININE 0.95 0.79  CALCIUM 9.0 8.4*   PT/INR No results for input(s): LABPROT, INR in the last 72 hours. CMP     Component Value Date/Time   NA 144 06/24/2020 0501   NA 145 (H) 08/08/2018 1535   K 3.2 (L) 06/24/2020 0501   CL 106 06/24/2020 0501   CO2 25 06/24/2020 0501   GLUCOSE 76 06/24/2020 0501   BUN 10 06/24/2020 0501   BUN 25 08/08/2018 1535   CREATININE 0.79 06/24/2020 0501   CREATININE 1.19 (H) 05/13/2020 1123   CALCIUM 8.4 (L) 06/24/2020 0501   PROT 7.3 06/23/2020 1323   PROT 7.8 08/08/2018 1535   ALBUMIN 3.3 (L) 06/23/2020 1323   ALBUMIN 4.6 08/08/2018 1535   AST 18 06/23/2020 1323   ALT 15 06/23/2020 1323   ALKPHOS 49 06/23/2020 1323   BILITOT 1.2 06/23/2020 1323   BILITOT 0.6 08/08/2018 1535   GFRNONAA >60 06/24/2020 0501    GFRNONAA 42 (L) 05/13/2020 1123   GFRAA >60 06/24/2020 0501   GFRAA 48 (L) 05/13/2020 1123   Lipase     Component Value Date/Time   LIPASE 21 06/23/2020 1323       Studies/Results: CT ABDOMEN PELVIS W CONTRAST  Result Date: 06/23/2020 CLINICAL DATA:  Weakness, fever, dehydration EXAM: CT ABDOMEN AND PELVIS WITH CONTRAST TECHNIQUE: Multidetector CT imaging of the abdomen and pelvis was performed using the standard protocol following bolus administration of intravenous contrast. CONTRAST:  117m OMNIPAQUE IOHEXOL 300 MG/ML  SOLN COMPARISON:  02/03/2018 FINDINGS: Lower chest: Stable subpleural scarring, with scattered tree in bud opacities at the bases compatible with chronic indolent infection such as MAC. Otherwise no acute pleural or parenchymal lung disease. Hepatobiliary: No focal liver abnormality is seen. No gallstones, gallbladder wall thickening, or biliary dilatation. Pancreas: Unremarkable. No pancreatic ductal dilatation or surrounding inflammatory changes. Spleen: Normal in size without focal abnormality. Adrenals/Urinary Tract: Adrenal glands are unremarkable. Kidneys are normal, without renal calculi, focal lesion, or hydronephrosis. Bladder is unremarkable. Stomach/Bowel: No bowel obstruction or ileus. There is wall thickening of the sigmoid colon, with surrounding pericolonic fat stranding. There is free gas within the adjacent mesentery, consistent with perforated sigmoid diverticulitis. There  is no fluid collection or abscess. Normal appendix is seen in the right mid abdomen. Vascular/Lymphatic: Aortic atherosclerosis. No enlarged abdominal or pelvic lymph nodes. Reproductive: Uterus and right adnexa are unremarkable. There is a 3.4 cm simple appearing cyst within the left ovary, previous having measured 2.9 cm. Other: No free fluid. Free gas in the left lower quadrant adjacent to the sigmoid diverticulitis. No abdominal wall hernia. Musculoskeletal: No acute or destructive bony  lesions. Severe osteoarthritis within the right hip. Postsurgical changes lower lumbar spine. Reconstructed images demonstrate no additional findings. IMPRESSION: 1. Acute perforated sigmoid diverticulitis, with pneumoperitoneum in the left lower quadrant. No fluid collection or abscess. 2. A 3.4 cm simple appearing left ovarian cyst, previous having measured 2.9 cm. Pelvic ultrasound recommended if not previously performed. This recommendation follows ACR consensus guidelines: White Paper of the ACR Incidental Findings Committee II on Adnexal Findings. J Am Coll Radiol 2013:10:675-681. 3. Aortic Atherosclerosis (ICD10-I70.0). These results were called by telephone at the time of interpretation on 06/23/2020 at 3:26 pm to provider Emeterio Reeve , who verbally acknowledged these results. Electronically Signed   By: Randa Ngo M.D.   On: 06/23/2020 15:33   DG Chest Portable 1 View  Result Date: 06/23/2020 CLINICAL DATA:  Nausea and weakness. EXAM: PORTABLE CHEST 1 VIEW COMPARISON:  06/09/1999 FINDINGS: Hypoventilation. Decreased lung volume with bibasilar atelectasis left greater than right. Negative for heart failure. No pleural effusion. ACDF cervical spine. IMPRESSION: Hypoventilation with bibasilar atelectasis. Electronically Signed   By: Franchot Gallo M.D.   On: 06/23/2020 13:56    Anti-infectives: Anti-infectives (From admission, onward)   Start     Dose/Rate Route Frequency Ordered Stop   06/25/20 0945  ertapenem (INVANZ) 1,000 mg in sodium chloride 0.9 % 100 mL IVPB     Discontinue     1 g 200 mL/hr over 30 Minutes Intravenous Every 24 hours 06/25/20 0933     06/24/20 1000  cefTRIAXone (ROCEPHIN) 2 g in sodium chloride 0.9 % 100 mL IVPB  Status:  Discontinued        2 g 200 mL/hr over 30 Minutes Intravenous Every 24 hours 06/23/20 1703 06/25/20 0933   06/24/20 0000  metroNIDAZOLE (FLAGYL) IVPB 500 mg  Status:  Discontinued        500 mg 100 mL/hr over 60 Minutes Intravenous Every 8  hours 06/23/20 1703 06/25/20 0933   06/23/20 1445  cefTRIAXone (ROCEPHIN) 2 g in sodium chloride 0.9 % 100 mL IVPB        2 g 200 mL/hr over 30 Minutes Intravenous  Once 06/23/20 1432 06/23/20 1629   06/23/20 1430  levofloxacin (LEVAQUIN) IVPB 750 mg  Status:  Discontinued        750 mg 100 mL/hr over 90 Minutes Intravenous  Once 06/23/20 1423 06/23/20 1432   06/23/20 1430  metroNIDAZOLE (FLAGYL) IVPB 500 mg        500 mg 100 mL/hr over 60 Minutes Intravenous  Once 06/23/20 1423 06/23/20 1732       Assessment/Plan Adrenal failure on chronic steroids Asthma Fibromyalgia RA CHF 3.4 cm L ovarian cyst - seen incidentally on CT   Sigmoid diverticulitis with perforation - still with pain today, that she seems to state is no better yet. -WBC down to 12K though.  AF -will try to adjust her meds to Invanz if pharmacy will allow given her multitude of allergies to see if this will help -given her use of steroids and age, would like to avoid surgery if  able -remain NPO except may have some clear liquids from the floor  FEN - NPO, sips of clears VTE - lovenox ID - invanz   LOS: 2 days    Henreitta Cea , [facility] 06/25/2020, 9:34 AM Please see Amion for pager number during day hours 7:00am-4:30pm or 7:00am -11:30am on weekends

## 2020-06-25 NOTE — Progress Notes (Signed)
PROGRESS NOTE    Eileen Wilkerson  KXF:[id number] DOB: [birth year] DOA: 06/23/2020 PCP: [name], MD   Chef Complaints: Abdominal pain x2 to 3 days  Brief Narrative: 86 y.o. female with medical history significant of RA, hypothyroid, asthma who presents with abd pain over the past 2-3 days. Patient reports initially having decreased appetite on 7/12 with no other notable symptoms. On 7/13, patient experienced marked and persistent abd pain across the abdomen. Symptoms persisted overnight and patient subsequently presented to the ED for further work up. Denies chest pain or sob. Did report a temp as high as 100.1 prior to visit. Pt also reported several bouts of diarrhea associated with abd discomfort  ED Course: In the ED, pt was found to be afebrile. Presenting HR was up to 109 bpm and RR as high as 22. WBC noted to be over 17k with CT abd/pelvis demonstrating acute sigmoid diverticulitis with pneumoperitoneum, reviewed.  General surgery was consulted and patient was admitted for further management.    Subjective:  Complains of ongoing abdominal pain mostly in the right upper quadrant but reports it is tender when pushed accross on lower abdomen. Asking to increase her pain medication or change. Passing gas reports has not had a bowel movement and reports she has not eaten since Monday.   Assessment & Plan:  Sigmoid colon diverticulitis with perforation and sepsis POA: Complains of persistent pain, appreciate surgery input changing ceftriaxone/Flagyl to [facility] for better coverage. Continue on npo,IV fluids,increase morphine to 4 mg due to ongoing abdominal pain.CBC is downtrending patient is afebrile but has ongoing abdominal pain.  Recent Labs  Lab 06/23/20 1323 06/24/20 0501 06/25/20 0849  WBC 17.3* 12.9* 10.9*   Hypokalemia was replaced:Repeat BMP.  Rheumatoid arthritis stable at this time on chronic prednisone 10 mg and Prevacid at home.Continue PPI continue her on  prednisone.   DJD,cervical:Stable.  Pure hypercholesterolemia:Continue statin.  DVT prophylaxis: enoxaparin (LOVENOX) injection 40 mg Start: 06/24/20 0800 Code Status:Full code Family Communication:plan of care discussed with patient at bedside. I called and updated her daughter Eileen Wilkerson.  Status is: Inpatient Remains inpatient appropriate because:IV treatments appropriate due to intensity of illness or inability to take PO and Inpatient level of care appropriate due to severity of illness Remains hospitalized for ongoing IV antibiotics with close follow-up on some diverticulitis.  Dispo:The patient is from: Home.            Anticipated d/c is to: Home.            Anticipated d/c date is: 3 days.            Patient currently is not medically stable to d/c. Diet Order            Diet NPO time specified Except for: Ice Chips, Sips with Meds  Diet effective now                 Body mass index is 29.52 kg/m.  Consultants:see note  Procedures:see note Microbiology:see note  Medications: Scheduled Meds: . atorvastatin  20 mg Oral Daily  . cycloSPORINE  1 drop Both Eyes BID  . enoxaparin (LOVENOX) injection  40 mg Subcutaneous Q24H  . fluticasone  2 spray Each Nare Daily  . montelukast  10 mg Oral QHS  . pantoprazole  40 mg Oral Daily  . predniSONE  10 mg Oral Q breakfast   Continuous Infusions: . sodium chloride 100 mL/hr at 06/24/20 1925  . cefTRIAXone (ROCEPHIN)  IV 2 g (06/24/20  1001)  . metronidazole 500 mg (06/24/20 2338)    Antimicrobials: Anti-infectives (From admission, onward)   Start     Dose/Rate Route Frequency Ordered Stop   06/24/20 1000  cefTRIAXone (ROCEPHIN) 2 g in sodium chloride 0.9 % 100 mL IVPB     Discontinue     2 g 200 mL/hr over 30 Minutes Intravenous Every 24 hours 06/23/20 1703     06/24/20 0000  metroNIDAZOLE (FLAGYL) IVPB 500 mg     Discontinue     500 mg 100 mL/hr over 60 Minutes Intravenous Every 8 hours 06/23/20 1703     06/23/20  1445  cefTRIAXone (ROCEPHIN) 2 g in sodium chloride 0.9 % 100 mL IVPB        2 g 200 mL/hr over 30 Minutes Intravenous  Once 06/23/20 1432 06/23/20 1629   06/23/20 1430  levofloxacin (LEVAQUIN) IVPB 750 mg  Status:  Discontinued        750 mg 100 mL/hr over 90 Minutes Intravenous  Once 06/23/20 1423 06/23/20 1432   06/23/20 1430  metroNIDAZOLE (FLAGYL) IVPB 500 mg        500 mg 100 mL/hr over 60 Minutes Intravenous  Once 06/23/20 1423 06/23/20 1732       Objective: Vitals: Today's Vitals   06/25/20 0006 06/25/20 0534 06/25/20 0604 06/25/20 0628  BP:    (!) 142/67  Pulse:    82  Resp:    14  Temp:    98.3 F (36.8 C)  TempSrc:    Oral  SpO2:    94%  Weight:      Height:      PainSc: Asleep 7  Asleep     Intake/Output Summary (Last 24 hours) at 06/25/2020 0923 Last data filed at 06/25/2020 0430 Gross per 24 hour  Intake 2169.85 ml  Output 1400 ml  Net 769.85 ml   Filed Weights   06/23/20 1322  Weight: 78 kg   Weight change:    Intake/Output from previous day: 07/15 0701 - 07/16 0700 In: 2169.9 [P.O.:240; I.V.:1529.8; IV Piggyback:400.1] Out: 1400 [Urine:1400] Intake/Output this shift: No intake/output data recorded.  Examination:  General exam: AAOx3, in pain, NAD,weak appearing. HEENT:Oral mucosa moist, Ear/Nose WNL grossly,dentition normal. Respiratory system: bilaterally clear,no wheezing or crackles,no use of accessory muscle. Cardiovascular system: S1 & S2 +,No JVD. Gastrointestinal system: Abdomen soft, bowel sounds increased, tenderness diffusely more in lower abdomen.  Nervous System:Alert, awake, moving extremities and grossly nonfocal. Extremities: No edema, distal peripheral pulses palpable.  Skin: No rashes,no icterus. MSK: Normal muscle bulk,tone, power.  Data Reviewed: I have personally reviewed following labs and imaging studies CBC: Recent Labs  Lab 06/23/20 1323 06/24/20 0501 06/25/20 0849  WBC 17.3* 12.9* 10.9*  NEUTROABS 12.8*  --   7.8*  HGB 13.6 14.3 11.5*  HCT 42.4 46.0 37.1  MCV 100.2* 100.9* 102.2*  PLT 272 208 086   Basic Metabolic Panel: Recent Labs  Lab 06/23/20 1323 06/24/20 0501  NA 139 144  K 3.5 3.2*  CL 96* 106  CO2 27 25  GLUCOSE 108* 76  BUN 11 10  CREATININE 0.95 0.79  CALCIUM 9.0 8.4*   GFR: Estimated Creatinine Clearance: 51.9 mL/min (by C-G formula based on SCr of 0.79 mg/dL). Liver Function Tests: Recent Labs  Lab 06/23/20 1323  AST 18  ALT 15  ALKPHOS 49  BILITOT 1.2  PROT 7.3  ALBUMIN 3.3*   Recent Labs  Lab 06/23/20 1323  LIPASE 21   No results for input(s):  AMMONIA in the last 168 hours. Coagulation Profile: No results for input(s): INR, PROTIME in the last 168 hours. Cardiac Enzymes: No results for input(s): CKTOTAL, CKMB, CKMBINDEX, TROPONINI in the last 168 hours. BNP (last 3 results) No results for input(s): PROBNP in the last 8760 hours. HbA1C: No results for input(s): HGBA1C in the last 72 hours. CBG: No results for input(s): GLUCAP in the last 168 hours. Lipid Profile: No results for input(s): CHOL, HDL, LDLCALC, TRIG, CHOLHDL, LDLDIRECT in the last 72 hours. Thyroid Function Tests: No results for input(s): TSH, T4TOTAL, FREET4, T3FREE, THYROIDAB in the last 72 hours. Anemia Panel: No results for input(s): VITAMINB12, FOLATE, FERRITIN, TIBC, IRON, RETICCTPCT in the last 72 hours. Sepsis Labs: Recent Labs  Lab 06/23/20 1323  LATICACIDVEN 1.5    Recent Results (from the past 240 hour(s))  SARS Coronavirus 2 by RT PCR (hospital order, performed in [facility] hospital lab) Nasopharyngeal Nasopharyngeal Swab     Status: None   Collection Time: 06/23/20  1:39 PM   Specimen: Nasopharyngeal Swab  Result Value Ref Range Status   SARS Coronavirus 2 NEGATIVE NEGATIVE Final    Comment: (NOTE) SARS-CoV-2 target nucleic acids are NOT DETECTED.  The SARS-CoV-2 RNA is generally detectable in upper and lower respiratory specimens during the acute phase of  infection. The lowest concentration of SARS-CoV-2 viral copies this assay can detect is 250 copies / mL. A negative result does not preclude SARS-CoV-2 infection and should not be used as the sole basis for treatment or other patient management decisions.  A negative result may occur with improper specimen collection / handling, submission of specimen other than nasopharyngeal swab, presence of viral mutation(s) within the areas targeted by this assay, and inadequate number of viral copies (<250 copies / mL). A negative result must be combined with clinical observations, patient history, and epidemiological information.  Fact Sheet for Patients:   StrictlyIdeas.no  Fact Sheet for Healthcare Providers: BankingDealers.co.za  This test is not yet approved or  cleared by the [place] FDA and has been authorized for detection and/or diagnosis of SARS-CoV-2 by FDA under an Emergency Use Authorization (EUA).  This EUA will remain in effect (meaning this test can be used) for the duration of the COVID-19 declaration under Section 564(b)(1) of the Act, 21 U.S.C. section 360bbb-3(b)(1), unless the authorization is terminated or revoked sooner.  Performed at [facility], [place] [address], [place], [place] [address]   Culture, blood (routine x 2)     Status: None (Preliminary result)   Collection Time: 06/23/20  2:17 PM   Specimen: BLOOD  Result Value Ref Range Status   Specimen Description   Final    BLOOD RIGHT ANTECUBITAL Performed at [place] [address] [place], [place] [address]    Special Requests   Final    BOTTLES DRAWN AEROBIC AND ANAEROBIC Blood Culture results may not be optimal due to an inadequate volume of blood received in culture bottles Performed at [place] [address], [place], [place] [address]    Culture   Final    NO GROWTH < 24  HOURS Performed at [facility] [address], [place], [place] [address]    Report Status PENDING  Incomplete  Culture, blood (routine x 2)     Status: None (Preliminary result)   Collection Time: 06/23/20  2:17 PM   Specimen: BLOOD  Result Value Ref Range Status   Specimen Description  Final    BLOOD LEFT ANTECUBITAL Performed at [place] [address], [place], [place] [address]    Special Requests   Final    BOTTLES DRAWN AEROBIC AND ANAEROBIC Blood Culture adequate volume Performed at [facility] [address], [place], [place] [address]    Culture   Final    NO GROWTH < 24 HOURS Performed at [facility] [address], [place], [place] [address]    Report Status PENDING  Incomplete  Urine culture     Status: None   Collection Time: 06/24/20  4:00 AM   Specimen: Urine, Clean Catch  Result Value Ref Range Status   Specimen Description   Final    URINE, CLEAN CATCH Performed at [facility], [place] [address], [place], [place] [address]    Special Requests   Final    NONE Performed at [facility], [place] [address] [place], [place] [address]    Culture   Final    NO GROWTH Performed at [facility], [place] [address] [place], [place] [address]    Report Status 06/25/2020 FINAL  Final      Radiology Studies: CT ABDOMEN PELVIS W CONTRAST  Result Date: 06/23/2020 CLINICAL DATA:  Weakness, fever, dehydration EXAM: CT ABDOMEN AND PELVIS WITH CONTRAST TECHNIQUE: Multidetector CT imaging of the abdomen and pelvis was performed using the standard protocol following bolus administration of intravenous contrast. CONTRAST:  149m OMNIPAQUE IOHEXOL 300 MG/ML  SOLN COMPARISON:  02/03/2018 FINDINGS: Lower chest: Stable subpleural scarring, with scattered tree in bud opacities at the bases compatible with chronic indolent infection such as MAC. Otherwise no acute pleural or  parenchymal lung disease. Hepatobiliary: No focal liver abnormality is seen. No gallstones, gallbladder wall thickening, or biliary dilatation. Pancreas: Unremarkable. No pancreatic ductal dilatation or surrounding inflammatory changes. Spleen: Normal in size without focal abnormality. Adrenals/Urinary Tract: Adrenal glands are unremarkable. Kidneys are normal, without renal calculi, focal lesion, or hydronephrosis. Bladder is unremarkable. Stomach/Bowel: No bowel obstruction or ileus. There is wall thickening of the sigmoid colon, with surrounding pericolonic fat stranding. There is free gas within the adjacent mesentery, consistent with perforated sigmoid diverticulitis. There is no fluid collection or abscess. Normal appendix is seen in the right mid abdomen. Vascular/Lymphatic: Aortic atherosclerosis. No enlarged abdominal or pelvic lymph nodes. Reproductive: Uterus and right adnexa are unremarkable. There is a 3.4 cm simple appearing cyst within the left ovary, previous having measured 2.9 cm. Other: No free fluid. Free gas in the left lower quadrant adjacent to the sigmoid diverticulitis. No abdominal wall hernia. Musculoskeletal: No acute or destructive bony lesions. Severe osteoarthritis within the right hip. Postsurgical changes lower lumbar spine. Reconstructed images demonstrate no additional findings. IMPRESSION: 1. Acute perforated sigmoid diverticulitis, with pneumoperitoneum in the left lower quadrant. No fluid collection or abscess. 2. A 3.4 cm simple appearing left ovarian cyst, previous having measured 2.9 cm. Pelvic ultrasound recommended if not previously performed. This recommendation follows ACR consensus guidelines: White Paper of the ACR Incidental Findings Committee II on Adnexal Findings. J Am Coll Radiol 2013:10:675-681. 3. Aortic Atherosclerosis (ICD10-I70.0). These results were called by telephone at the time of interpretation on 06/23/2020 at 3:26 pm to provider KEmeterio Reeve, who  verbally acknowledged these results. Electronically Signed   By: MRanda NgoM.D.   On: 06/23/2020 15:33   DG Chest Portable 1 View  Result Date: 06/23/2020 CLINICAL DATA:  Nausea and weakness. EXAM: PORTABLE  CHEST 1 VIEW COMPARISON:  06/09/1999 FINDINGS: Hypoventilation. Decreased lung volume with bibasilar atelectasis left greater than right. Negative for heart failure. No pleural effusion. ACDF cervical spine. IMPRESSION: Hypoventilation with bibasilar atelectasis. Electronically Signed   By: Franchot Gallo M.D.   On: 06/23/2020 13:56     LOS: 2 days   Antonieta Pert, MD [facility]  06/25/2020, 9:23 AM

## 2020-06-25 NOTE — Care Management Important Message (Signed)
Important Message  Patient Details IM Letter presented to the Patient Name: Eileen Wilkerson MRN: [id number] Date of Birth: 04-Jun-1932   Medicare Important Message Given:  Yes     Kerin Salen 06/25/2020, 2:27 PM

## 2020-06-25 NOTE — Telephone Encounter (Signed)
Pharmacy requested refill.  °Pended Rx and sent to Dr. Gupta for approval due to HIGH ALERT Warning.  °

## 2020-06-26 LAB — CBC
HCT: 38.2 % (ref 36.0–46.0)
Hemoglobin: 11.8 g/dL — ABNORMAL LOW (ref 12.0–15.0)
MCH: 31.4 pg (ref 26.0–34.0)
MCHC: 30.9 g/dL (ref 30.0–36.0)
MCV: 101.6 fL — ABNORMAL HIGH (ref 80.0–100.0)
Platelets: 262 10*3/uL (ref 150–400)
RBC: 3.76 MIL/uL — ABNORMAL LOW (ref 3.87–5.11)
RDW: 13.5 % (ref 11.5–15.5)
WBC: 9.5 10*3/uL (ref 4.0–10.5)
nRBC: 0 % (ref 0.0–0.2)

## 2020-06-26 LAB — BASIC METABOLIC PANEL
Anion gap: 10 (ref 5–15)
BUN: 8 mg/dL (ref 8–23)
CO2: 20 mmol/L — ABNORMAL LOW (ref 22–32)
Calcium: 8.7 mg/dL — ABNORMAL LOW (ref 8.9–10.3)
Chloride: 113 mmol/L — ABNORMAL HIGH (ref 98–111)
Creatinine, Ser: 0.68 mg/dL (ref 0.44–1.00)
GFR calc Af Amer: >60 mL/min (ref >60)
GFR calc non Af Amer: >60 mL/min (ref >60)
Glucose, Bld: 89 mg/dL (ref 70–99)
Potassium: 4.1 mmol/L (ref 3.5–5.1)
Sodium: 143 mmol/L (ref 135–145)

## 2020-06-26 MED ORDER — LACTATED RINGERS IV SOLN: INTRAVENOUS | Status: DC

## 2020-06-26 NOTE — Evaluation (Signed)
Physical Therapy Evaluation Patient Details Name: Eileen Wilkerson MRN: [id number] DOB: [birth year] Today's Date: 06/26/2020   History of Present Illness  Pt is 86 yo female with PMH including RA, hypothyroidism, and asthma.  Pt presented to ED with abd pain over the past 2-3 days. Pt admitted with sigmoid colon diverticulitis with perforation and sepsis.  Pt currently being managed conservately without surgery if possible.  Clinical Impression  Pt admitted with above diagnosis.  Pt currently with functional limitations due to the deficits listed below (see PT Problem List). She was able to transfer with min A and ambulate with min guard and cues for RW.  Pt demonstrated steady gait without LOB.  She was limited due to fatigue.  Pt expected to progress well. Pt will benefit from skilled PT to increase their independence and safety with mobility to allow discharge to the venue listed below.       Follow Up Recommendations Home health PT;Other (comment) (return to ILF)    Equipment Recommendations  None recommended by PT    Recommendations for Other Services       Precautions / Restrictions Precautions Precautions: Fall      Mobility  Bed Mobility Overal bed mobility: Needs Assistance Bed Mobility: Supine to Sit     Supine to sit: Min assist;HOB elevated     General bed mobility comments: hand hold to scoot to edge of bed  Transfers Overall transfer level: Needs assistance Equipment used: Rolling walker (2 wheeled) Transfers: Sit to/from Stand Sit to Stand: Min guard            Ambulation/Gait Ambulation/Gait assistance: Min guard Gait Distance (Feet): 50 Feet Assistive device: Rolling walker (2 wheeled) Gait Pattern/deviations: Step-through pattern;Decreased stride length;Trunk flexed Gait velocity: decreased   General Gait Details: cues for RW use and posture  Stairs            Wheelchair Mobility    Modified Rankin (Stroke Patients Only)        Balance Overall balance assessment: Needs assistance Sitting-balance support: No upper extremity supported;Feet supported Sitting balance-Leahy Scale: Good     Standing balance support: No upper extremity supported Standing balance-Leahy Scale: Good                               Pertinent Vitals/Pain Pain Assessment: 0-10 Pain Score: 6  Pain Location: Legs sore from RA Pain Descriptors / Indicators: Sore Pain Intervention(s): Premedicated before session;Monitored during session;Limited activity within patient's tolerance    Home Living Family/patient expects to be discharged to:: Other (Comment)               Home Equipment: Gilford Rile - 4 wheels Additional Comments: Lives at [facility]    Prior Function Level of Independence: Independent with assistive device(s)         Comments: Reports independence with ADLs and that she drives. Reports using a cane and or rollator for ambulation. Walks to deli or dining room for meals. Denies recent falls     Hand Dominance   Dominant Hand: Right    Extremity/Trunk Assessment   Upper Extremity Assessment Upper Extremity Assessment: Defer to OT evaluation    Lower Extremity Assessment Lower Extremity Assessment: Overall WFL for tasks assessed    Cervical / Trunk Assessment Cervical / Trunk Assessment: Normal  Communication   Communication: No difficulties  Cognition Arousal/Alertness: Awake/alert Behavior During Therapy: WFL for tasks assessed/performed Overall Cognitive Status:  Within Functional Limits for tasks assessed                                        General Comments General comments (skin integrity, edema, etc.): VSS    Exercises     Assessment/Plan    PT Assessment Patient needs continued PT services  PT Problem List Decreased strength;Decreased mobility;Decreased range of motion;Decreased knowledge of precautions;Decreased activity tolerance;Decreased  balance;Decreased knowledge of use of DME       PT Treatment Interventions DME instruction;Therapeutic activities;Gait training;Therapeutic exercise;Patient/family education;Balance training;Functional mobility training    PT Goals (Current goals can be found in the Care Plan section)  Acute Rehab PT Goals Patient Stated Goal: To go home by Monday PT Goal Formulation: With patient Time For Goal Achievement: 07/10/20 Potential to Achieve Goals: Good    Frequency Min 3X/week   Barriers to discharge        Co-evaluation               AM-PAC PT "6 Clicks" Mobility  Outcome Measure Help needed turning from your back to your side while in a flat bed without using bedrails?: None Help needed moving from lying on your back to sitting on the side of a flat bed without using bedrails?: A Little Help needed moving to and from a bed to a chair (including a wheelchair)?: None Help needed standing up from a chair using your arms (e.g., wheelchair or bedside chair)?: None Help needed to walk in hospital room?: None Help needed climbing 3-5 steps with a railing? : A Little 6 Click Score: 22    End of Session Equipment Utilized During Treatment: Gait belt Activity Tolerance: Patient tolerated treatment well Patient left: in chair;with call bell/phone within reach;with chair alarm set Nurse Communication: Mobility status PT Visit Diagnosis: Muscle weakness (generalized) (M62.81);Other abnormalities of gait and mobility (R26.89)    Time: 0017-4944 PT Time Calculation (min) (ACUTE ONLY): 17 min   Charges:   PT Evaluation $PT Eval Low Complexity: 1 Low          Briceida Rasberry, PT Acute Rehab Services Pager [phone] [facility] [phone]    Karlton Lemon 06/26/2020, 4:31 PM

## 2020-06-26 NOTE — Progress Notes (Signed)
PROGRESS NOTE    Eileen Wilkerson  XNA:[id number] DOB: October 05, 1932 DOA: 06/23/2020 PCP: [name], MD   Chef Complaints: Abdominal pain x2 to 3 days  Brief Narrative: 86 y.o. female with medical history significant of RA, hypothyroid, asthma who presents with abd pain over the past 2-3 days. Patient reports initially having decreased appetite on 7/12 with no other notable symptoms. On 7/13, patient experienced marked and persistent abd pain across the abdomen. Symptoms persisted overnight and patient subsequently presented to the ED for further work up. Denies chest pain or sob. Did report a temp as high as 100.1 prior to visit. Pt also reported several bouts of diarrhea associated with abd discomfort  ED Course: In the ED, pt was found to be afebrile. Presenting HR was up to 109 bpm and RR as high as 22. WBC noted to be over 17k with CT abd/pelvis demonstrating acute sigmoid diverticulitis with pneumoperitoneum, reviewed.  General surgery was consulted and patient was admitted for further management.    Subjective:  Pain is controlled. Feels little better. Gets up to bathroom loose BM each time she voids No nausea or vomiting son was killed yesterday and wants to go home soon.  Assessment & Plan:  Sigmoid colon diverticulitis with perforation and sepsis POA: Still complains of abdominal pain, she is afebrile, leukocytosis has resolved.  On [place] dosing has lot of antibiotic allergy.  Discussed with surgery, started full liquid diet continue antibiotics, pain management, hydration and monitor.   Recent Labs  Lab 06/23/20 1323 06/24/20 0501 06/25/20 0849 06/26/20 0518  WBC 17.3* 12.9* 10.9* 9.5   Hypokalemia -resolved, change IV fluids to Ringer's lactate. Recent Labs  Lab 06/23/20 1323 06/24/20 0501 06/25/20 0941 06/26/20 0518  K 3.5 3.2* 3.1* 4.1   Rheumatoid arthritis stable at this time on chronic prednisone 10 mg and Prevacid at home.Continue PPI  continue her on prednisone.   DJD,cervical:Stable.  Pure hypercholesterolemia:Continue statin.  DVT prophylaxis: enoxaparin (LOVENOX) injection 40 mg Start: 06/24/20 0800 Code Status:Full code Family Communication:plan of care discussed with patient at bedside. I called and updated her daughter [name] 7/16.  Status is: Inpatient Remains inpatient appropriate because:IV treatments appropriate due to intensity of illness or inability to take PO and Inpatient level of care appropriate due to severity of illness Remains hospitalized for ongoing IV antibiotics with close follow-up on some diverticulitis.  Dispo:The patient is from: Home.            Anticipated d/c is to: Home.            Anticipated d/c date is: 2-3 days.            Patient currently is not medically stable to d/c. Diet Order            Diet full liquid Room service appropriate? Yes; Fluid consistency: Thin  Diet effective now                 Body mass index is 29.52 kg/m.  Consultants:see note  Procedures:see note Microbiology:see note  Medications: Scheduled Meds: . acetaminophen  1,000 mg Oral Q6H  . atorvastatin  20 mg Oral Daily  . cycloSPORINE  1 drop Both Eyes BID  . enoxaparin (LOVENOX) injection  40 mg Subcutaneous Q24H  . fluticasone  2 spray Each Nare Daily  . montelukast  10 mg Oral QHS  . pantoprazole  40 mg Oral Daily  . predniSONE  10 mg Oral Q breakfast   Continuous Infusions: .  ertapenem 1,000 mg (06/26/20 1059)  . lactated ringers 75 mL/hr at 06/26/20 1103    Antimicrobials: Anti-infectives (From admission, onward)   Start     Dose/Rate Route Frequency Ordered Stop   06/25/20 1000  ertapenem (INVANZ) 1,000 mg in sodium chloride 0.9 % 100 mL IVPB     Discontinue     1 g 200 mL/hr over 30 Minutes Intravenous Every 24 hours 06/25/20 0933     06/24/20 1000  cefTRIAXone (ROCEPHIN) 2 g in sodium chloride 0.9 % 100 mL IVPB  Status:  Discontinued        2 g 200 mL/hr over 30 Minutes  Intravenous Every 24 hours 06/23/20 1703 06/25/20 0933   06/24/20 0000  metroNIDAZOLE (FLAGYL) IVPB 500 mg  Status:  Discontinued        500 mg 100 mL/hr over 60 Minutes Intravenous Every 8 hours 06/23/20 1703 06/25/20 0933   06/23/20 1445  cefTRIAXone (ROCEPHIN) 2 g in sodium chloride 0.9 % 100 mL IVPB        2 g 200 mL/hr over 30 Minutes Intravenous  Once 06/23/20 1432 06/23/20 1629   06/23/20 1430  levofloxacin (LEVAQUIN) IVPB 750 mg  Status:  Discontinued        750 mg 100 mL/hr over 90 Minutes Intravenous  Once 06/23/20 1423 06/23/20 1432   06/23/20 1430  metroNIDAZOLE (FLAGYL) IVPB 500 mg        500 mg 100 mL/hr over 60 Minutes Intravenous  Once 06/23/20 1423 06/23/20 1732       Objective: Vitals: Today's Vitals   06/26/20 0535 06/26/20 0900 06/26/20 0938 06/26/20 1332  BP: (!) 164/82   (!) 148/51  Pulse: 95   75  Resp: 18   18  Temp: 97.8 F (36.6 C)   98.3 F (36.8 C)  TempSrc:    Oral  SpO2: 100%   100%  Weight:      Height:      PainSc:  7  3      Intake/Output Summary (Last 24 hours) at 06/26/2020 1411 Last data filed at 06/26/2020 1259 Gross per 24 hour  Intake 3176.41 ml  Output 2300 ml  Net 876.41 ml   Filed Weights   06/23/20 1322  Weight: 78 kg   Weight change:    Intake/Output from previous day: 07/16 0701 - 07/17 0700 In: 3380.7 [P.O.:1080; I.V.:1800.6; IV Piggyback:500.1] Out: 2450 [Urine:2450] Intake/Output this shift: Total I/O In: 439.4 [I.V.:439.4] Out: 250 [Urine:250]  Examination: General exam: AAOx3 , NAD, weak appearing. HEENT:Oral mucosa moist, Ear/Nose WNL grossly, dentition normal. Respiratory system: bilaterally clear,no wheezing or crackles,no use of accessory muscle Cardiovascular system: S1 & S2 +, No JVD,. Gastrointestinal system: Abdomen soft, nontender, no rebound or guarding  Nervous System:Alert, awake, moving extremities and grossly nonfocal Extremities: No edema, distal peripheral pulses palpable.  Skin: No  rashes,no icterus. MSK: Normal muscle bulk,tone, power  Data Reviewed: I have personally reviewed following labs and imaging studies CBC: Recent Labs  Lab 06/23/20 1323 06/24/20 0501 06/25/20 0849 06/26/20 0518  WBC 17.3* 12.9* 10.9* 9.5  NEUTROABS 12.8*  --  7.8*  --   HGB 13.6 14.3 11.5* 11.8*  HCT 42.4 46.0 37.1 38.2  MCV 100.2* 100.9* 102.2* 101.6*  PLT 272 208 256 948   Basic Metabolic Panel: Recent Labs  Lab 06/23/20 1323 06/24/20 0501 06/25/20 0941 06/26/20 0518  NA 139 144 144 143  K 3.5 3.2* 3.1* 4.1  CL 96* 106 111 113*  CO2 27 25  22 20*  GLUCOSE 108* 76 78 89  BUN 11 10 10 8   CREATININE 0.95 0.79 0.66 0.68  CALCIUM 9.0 8.4* 8.2* 8.7*   GFR: Estimated Creatinine Clearance: 51.9 mL/min (by C-G formula based on SCr of 0.68 mg/dL). Liver Function Tests: Recent Labs  Lab 06/23/20 1323 06/25/20 0941  AST 18 20  ALT 15 14  ALKPHOS 49 43  BILITOT 1.2 1.1  PROT 7.3 6.3*  ALBUMIN 3.3* 2.7*   Recent Labs  Lab 06/23/20 1323  LIPASE 21   No results for input(s): AMMONIA in the last 168 hours. Coagulation Profile: No results for input(s): INR, PROTIME in the last 168 hours. Cardiac Enzymes: No results for input(s): CKTOTAL, CKMB, CKMBINDEX, TROPONINI in the last 168 hours. BNP (last 3 results) No results for input(s): PROBNP in the last 8760 hours. HbA1C: No results for input(s): HGBA1C in the last 72 hours. CBG: No results for input(s): GLUCAP in the last 168 hours. Lipid Profile: No results for input(s): CHOL, HDL, LDLCALC, TRIG, CHOLHDL, LDLDIRECT in the last 72 hours. Thyroid Function Tests: No results for input(s): TSH, T4TOTAL, FREET4, T3FREE, THYROIDAB in the last 72 hours. Anemia Panel: No results for input(s): VITAMINB12, FOLATE, FERRITIN, TIBC, IRON, RETICCTPCT in the last 72 hours. Sepsis Labs: Recent Labs  Lab 06/23/20 1323  LATICACIDVEN 1.5    Recent Results (from the past 240 hour(s))  SARS Coronavirus 2 by RT PCR (hospital  order, performed in [facility] hospital lab) Nasopharyngeal Nasopharyngeal Swab     Status: None   Collection Time: 06/23/20  1:39 PM   Specimen: Nasopharyngeal Swab  Result Value Ref Range Status   SARS Coronavirus 2 NEGATIVE NEGATIVE Final    Comment: (NOTE) SARS-CoV-2 target nucleic acids are NOT DETECTED.  The SARS-CoV-2 RNA is generally detectable in upper and lower respiratory specimens during the acute phase of infection. The lowest concentration of SARS-CoV-2 viral copies this assay can detect is 250 copies / mL. A negative result does not preclude SARS-CoV-2 infection and should not be used as the sole basis for treatment or other patient management decisions.  A negative result may occur with improper specimen collection / handling, submission of specimen other than nasopharyngeal swab, presence of viral mutation(s) within the areas targeted by this assay, and inadequate number of viral copies (<250 copies / mL). A negative result must be combined with clinical observations, patient history, and epidemiological information.  Fact Sheet for Patients:   StrictlyIdeas.no  Fact Sheet for Healthcare Providers: BankingDealers.co.za  This test is not yet approved or  cleared by the [place] FDA and has been authorized for detection and/or diagnosis of SARS-CoV-2 by FDA under an Emergency Use Authorization (EUA).  This EUA will remain in effect (meaning this test can be used) for the duration of the COVID-19 declaration under Section 564(b)(1) of the Act, 21 U.S.C. section 360bbb-3(b)(1), unless the authorization is terminated or revoked sooner.  Performed at [facility], [place] [address] [place], [place] [address]   Culture, blood (routine x 2)     Status: None (Preliminary result)   Collection Time: 06/23/20  2:17 PM   Specimen: BLOOD  Result Value Ref Range Status   Specimen Description   Final     BLOOD RIGHT ANTECUBITAL Performed at [facility] [address] [place], [place] [address]    Special Requests   Final    BOTTLES DRAWN AEROBIC AND ANAEROBIC Blood Culture results may not be optimal due to an  inadequate volume of blood received in culture bottles Performed at [facility] [address] [place], [place] [address]    Culture   Final    NO GROWTH 3 DAYS Performed at [facility], [place] [address], [place], [place] [address]    Report Status PENDING  Incomplete  Culture, blood (routine x 2)     Status: None (Preliminary result)   Collection Time: 06/23/20  2:17 PM   Specimen: BLOOD  Result Value Ref Range Status   Specimen Description   Final    BLOOD LEFT ANTECUBITAL Performed at [facility] [address] [place], [place] [address]    Special Requests   Final    BOTTLES DRAWN AEROBIC AND ANAEROBIC Blood Culture adequate volume Performed at [facility] [address] [place], [place] [address]    Culture   Final    NO GROWTH 3 DAYS Performed at [facility], [place] [address] [place], [place] [address]    Report Status PENDING  Incomplete  Urine culture     Status: None   Collection Time: 06/24/20  4:00 AM   Specimen: Urine, Clean Catch  Result Value Ref Range Status   Specimen Description   Final    URINE, CLEAN CATCH Performed at [facility], [place] [address], [place], [place] [address]    Special Requests   Final    NONE Performed at [facility], [place] [address] [place], [place] [address]    Culture   Final    NO GROWTH Performed at [facility], [place] [address] [place], [place] [address]    Report Status 06/25/2020 FINAL  Final      Radiology Studies: No results found.   LOS: 3 days   Antonieta Pert, MD [facility]  06/26/2020, 2:11 PM

## 2020-06-26 NOTE — Progress Notes (Signed)
Subjective/Chief Complaint: Told me her son was killed in a car wreck yesterday. Patient feels better.  She states her son was killed in a car accident.  She is anxious to leave the hospital but does not feel quite physically ready yet.  Her pain is better.  She has no fever or chills.   Objective: Vital signs in last 24 hours: Temp:  [97.7 F (36.5 C)-97.9 F (36.6 C)] 97.8 F (36.6 C) (07/17 0535) Pulse Rate:  [71-95] 95 (07/17 0535) Resp:  [18] 18 (07/17 0535) BP: (149-164)/(78-82) 164/82 (07/17 0535) SpO2:  [92 %-100 %] 100 % (07/17 0535) Last BM Date: 06/25/20  Intake/Output from previous day: 07/16 0701 - 07/17 0700 In: 3380.7 [P.O.:1080; I.V.:1800.6; IV Piggyback:500.1] Out: 2450 [Urine:2450] Intake/Output this shift: Total I/O In: 330.8 [I.V.:330.8] Out: 250 [Urine:250]  General appearance: alert and cooperative Resp: clear to auscultation bilaterally Cardio: regular rate and rhythm, S1, S2 normal, no murmur, click, rub or gallop GI: Soft nontender with no rebound or guarding  Lab Results:  Recent Labs    06/25/20 0849 06/26/20 0518  WBC 10.9* 9.5  HGB 11.5* 11.8*  HCT 37.1 38.2  PLT 256 262   BMET Recent Labs    06/25/20 0941 06/26/20 0518  NA 144 143  K 3.1* 4.1  CL 111 113*  CO2 22 20*  GLUCOSE 78 89  BUN 10 8  CREATININE 0.66 0.68  CALCIUM 8.2* 8.7*   PT/INR No results for input(s): LABPROT, INR in the last 72 hours. ABG No results for input(s): PHART, HCO3 in the last 72 hours.  Invalid input(s): PCO2, PO2  Studies/Results: No results found.  Anti-infectives: Anti-infectives (From admission, onward)   Start     Dose/Rate Route Frequency Ordered Stop   06/25/20 1000  ertapenem (INVANZ) 1,000 mg in sodium chloride 0.9 % 100 mL IVPB     Discontinue     1 g 200 mL/hr over 30 Minutes Intravenous Every 24 hours 06/25/20 0933     06/24/20 1000  cefTRIAXone (ROCEPHIN) 2 g in sodium chloride 0.9 % 100 mL IVPB  Status:  Discontinued         2 g 200 mL/hr over 30 Minutes Intravenous Every 24 hours 06/23/20 1703 06/25/20 0933   06/24/20 0000  metroNIDAZOLE (FLAGYL) IVPB 500 mg  Status:  Discontinued        500 mg 100 mL/hr over 60 Minutes Intravenous Every 8 hours 06/23/20 1703 06/25/20 0933   06/23/20 1445  cefTRIAXone (ROCEPHIN) 2 g in sodium chloride 0.9 % 100 mL IVPB        2 g 200 mL/hr over 30 Minutes Intravenous  Once 06/23/20 1432 06/23/20 1629   06/23/20 1430  levofloxacin (LEVAQUIN) IVPB 750 mg  Status:  Discontinued        750 mg 100 mL/hr over 90 Minutes Intravenous  Once 06/23/20 1423 06/23/20 1432   06/23/20 1430  metroNIDAZOLE (FLAGYL) IVPB 500 mg        500 mg 100 mL/hr over 60 Minutes Intravenous  Once 06/23/20 1423 06/23/20 1732      Assessment/Plan:  Adrenal failure on chronic steroids Asthma Fibromyalgia RA CHF 3.4 cm L ovarian cyst - seen incidentally on CT   Sigmoid diverticulitis with perforation - still with pain today, that she seems to state is no better yet. -WBC down   AF -will try to adjust her meds to Invanz if pharmacy will allow given her multitude of allergies to see if this will help -  given her use of steroids and age, would like to avoid surgery if able -FULL LIQUID DIET   FEN - FULL LIQUID DIET  VTE - lovenox ID - invanz  LOS: 3 days    Turner Daniels MD  06/26/2020

## 2020-06-26 NOTE — Evaluation (Signed)
Occupational Therapy Evaluation Patient Details Name: Eileen Wilkerson MRN: [id number] DOB: Jul 23, 1932 Today's Date: 06/26/2020    History of Present Illness 86 y.o. female with medical history significant of RA, hypothyroid, asthma who presents with abd pain over the past 2-3 days. Patient reports initially having decreased appetite on 7/12 with no other notable symptoms. On 7/13, patient experienced marked and persistent abd pain across the abdomen. Symptoms persisted overnight and patient subsequently presented to the ED for further work up. Denies chest pain or sob. Did report a temp as high as 100.1 prior to visit. Pt also reported several bouts of diarrhea associated with abd discomfort ED Course: In the ED, pt was found to be afebrile. Presenting HR was up to 109 bpm and RR as high as 22. WBC noted to be over 17k with CT abd/pelvis demonstrating acute sigmoid diverticulitis with pneumoperitoneum, reviewed. General surgery was consulted and patient was admitted for further management.   Clinical Impression   Eileen Wilkerson is an 86 year old woman who presents with generalized weakness, decreased activity tolerance and mild balance impairments resulting in decreased independence with ADLs and functional mobility. Patient reports not eating since Monday due to bowel rest. Patient will benefit from skilled OT services while in hospital in order to improve deficits and improve functional abilities in order to return home to independent living. Patient should improve with nutrition. Unlikely that patient will need OT services at discharge.     Follow Up Recommendations  No OT follow up    Equipment Recommendations  None recommended by OT    Recommendations for Other Services       Precautions / Restrictions Precautions Precautions: Fall      Mobility Bed Mobility Overal bed mobility: Needs Assistance Bed Mobility: Supine to Sit     Supine to sit: Min assist;HOB elevated      General bed mobility comments: hand hold to scoot to edge of bed  Transfers Overall transfer level: Needs assistance   Transfers: Sit to/from Stand Sit to Stand: Min assist         General transfer comment: Min assist for stand pivot to BSc and then to recliner.    Balance Overall balance assessment: Mild deficits observed, not formally tested                                         ADL either performed or assessed with clinical judgement   ADL Overall ADL's : Needs assistance/impaired Eating/Feeding: Independent   Grooming: Sitting;Wash/dry face;Wash/dry hands Grooming Details (indicate cue type and reason): washed face and hands in seated position. unable to tolerate standing at sink at this time. Upper Body Bathing: Set up;Sitting   Lower Body Bathing: Minimal assistance;Sit to/from stand   Upper Body Dressing : Set up;Sitting   Lower Body Dressing: Minimal assistance;Sit to/from stand Lower Body Dressing Details (indicate cue type and reason): Min assist to donn underwear - demonstrating difficulty getting RLE up reporting she typically has no problem. Toilet Transfer: Minimal Production assistant, radio Details (indicate cue type and reason): min assist for steadying Toileting- Clothing Manipulation and Hygiene: Min guard;Sit to/from stand       Functional mobility during ADLs: Minimal assistance General ADL Comments: min assist for hand hold due to unsteadiness and patient reporting she felt weak.     Vision   Vision Assessment?: No apparent visual deficits  Perception     Praxis      Pertinent Vitals/Pain Pain Assessment: No/denies pain     Hand Dominance Right   Extremity/Trunk Assessment Upper Extremity Assessment Upper Extremity Assessment: Overall WFL for tasks assessed   Lower Extremity Assessment Lower Extremity Assessment: Defer to PT evaluation   Cervical / Trunk Assessment Cervical / Trunk  Assessment: Normal   Communication Communication Communication: No difficulties   Cognition Arousal/Alertness: Awake/alert Behavior During Therapy: WFL for tasks assessed/performed Overall Cognitive Status: Within Functional Limits for tasks assessed                                     General Comments       Exercises     Shoulder Instructions      Home Living Family/patient expects to be discharged to:: Other (Comment)                                 Additional Comments: Lives at [facility]      Prior Functioning/Environment Level of Independence: Independent with assistive device(s)        Comments: Reports independence with ADLs and that she drives. Reports using a cane and or walker for ambulation. Walks to deli or dining room for meals.        OT Problem List: Decreased strength;Decreased activity tolerance;Impaired balance (sitting and/or standing)      OT Treatment/Interventions: Self-care/ADL training;Therapeutic exercise;Therapeutic activities;Balance training;Patient/family education    OT Goals(Current goals can be found in the care plan section) Acute Rehab OT Goals Patient Stated Goal: To go home by Monday OT Goal Formulation: With patient Time For Goal Achievement: 07/10/20 Potential to Achieve Goals: Good  OT Frequency: Min 2X/week   Barriers to D/C: Decreased caregiver support          Co-evaluation              AM-PAC OT "6 Clicks" Daily Activity     Outcome Measure Help from another person eating meals?: None Help from another person taking care of personal grooming?: A Little Help from another person toileting, which includes using toliet, bedpan, or urinal?: A Little Help from another person bathing (including washing, rinsing, drying)?: A Little Help from another person to put on and taking off regular upper body clothing?: A Little Help from another person to put on and taking  off regular lower body clothing?: A Little 6 Click Score: 19   End of Session Nurse Communication: Mobility status  Activity Tolerance: Patient limited by fatigue Patient left: in chair;with call bell/phone within reach  OT Visit Diagnosis: Unsteadiness on feet (R26.81);Muscle weakness (generalized) (M62.81)                Time: 1020-1041 OT Time Calculation (min): 21 min Charges:  OT General Charges $OT Visit: 1 Visit OT Evaluation $OT Eval Low Complexity: 1 Low  Vihaan Gloss, OTR/L [place]  Office [phone] Pager: [place] 06/26/2020, 12:20 PM

## 2020-06-27 LAB — CBC
HCT: 38.5 % (ref 36.0–46.0)
Hemoglobin: 12.3 g/dL (ref 12.0–15.0)
MCH: 31.8 pg (ref 26.0–34.0)
MCHC: 31.9 g/dL (ref 30.0–36.0)
MCV: 99.5 fL (ref 80.0–100.0)
Platelets: 272 10*3/uL (ref 150–400)
RBC: 3.87 MIL/uL (ref 3.87–5.11)
RDW: 13.5 % (ref 11.5–15.5)
WBC: 8.1 10*3/uL (ref 4.0–10.5)
nRBC: 0 % (ref 0.0–0.2)

## 2020-06-27 LAB — BASIC METABOLIC PANEL
Anion gap: 10 (ref 5–15)
BUN: 6 mg/dL — ABNORMAL LOW (ref 8–23)
CO2: 25 mmol/L (ref 22–32)
Calcium: 9.3 mg/dL (ref 8.9–10.3)
Chloride: 111 mmol/L (ref 98–111)
Creatinine, Ser: 0.66 mg/dL (ref 0.44–1.00)
GFR calc Af Amer: >60 mL/min (ref >60)
GFR calc non Af Amer: >60 mL/min (ref >60)
Glucose, Bld: 87 mg/dL (ref 70–99)
Potassium: 3.2 mmol/L — ABNORMAL LOW (ref 3.5–5.1)
Sodium: 146 mmol/L — ABNORMAL HIGH (ref 135–145)

## 2020-06-27 MED ORDER — POTASSIUM CHLORIDE CRYS ER 20 MEQ PO TBCR
40.0000 meq | EXTENDED_RELEASE_TABLET | Freq: Once | ORAL | Status: AC
  Administered 2020-06-27: 40 meq via ORAL
  Filled 2020-06-27: qty 2

## 2020-06-27 MED ORDER — MELATONIN 5 MG PO TABS
5.0000 mg | ORAL_TABLET | Freq: Once | ORAL | Status: AC
  Administered 2020-06-27: 5 mg via ORAL
  Filled 2020-06-27: qty 1

## 2020-06-27 NOTE — Progress Notes (Signed)
Subjective/Chief Complaint: I feel better Patient feels better. Abdominal pain is gone. Bowels moving.   Objective: Vital signs in last 24 hours: Temp:  [98.1 F (36.7 C)-98.3 F (36.8 C)] 98.1 F (36.7 C) (07/18 0544) Pulse Rate:  [75-88] 88 (07/18 0544) Resp:  [16-18] 16 (07/18 0544) BP: (148-157)/(51-93) 157/93 (07/18 0544) SpO2:  [95 %-100 %] 95 % (07/18 0544) Last BM Date: 06/25/20  Intake/Output from previous day: 07/17 0701 - 07/18 0700 In: 1809 [I.V.:1709; IV Piggyback:100] Out: 1400 [Urine:1400] Intake/Output this shift: Total I/O In: 120 [P.O.:120] Out: 400 [Urine:400]  Resp: clear to auscultation bilaterally Cardio: regular rate and rhythm, S1, S2 normal, no murmur, click, rub or gallop Incision/Wound: Abdomen is soft nontender. No rebound or guarding.  Lab Results:  Recent Labs    06/26/20 0518 06/27/20 0557  WBC 9.5 8.1  HGB 11.8* 12.3  HCT 38.2 38.5  PLT 262 272   BMET Recent Labs    06/26/20 0518 06/27/20 0557  NA 143 146*  K 4.1 3.2*  CL 113* 111  CO2 20* 25  GLUCOSE 89 87  BUN 8 6*  CREATININE 0.68 0.66  CALCIUM 8.7* 9.3   PT/INR No results for input(s): LABPROT, INR in the last 72 hours. ABG No results for input(s): PHART, HCO3 in the last 72 hours.  Invalid input(s): PCO2, PO2  Studies/Results: No results found.  Anti-infectives: Anti-infectives (From admission, onward)   Start     Dose/Rate Route Frequency Ordered Stop   06/25/20 1000  ertapenem (INVANZ) 1,000 mg in sodium chloride 0.9 % 100 mL IVPB     Discontinue     1 g 200 mL/hr over 30 Minutes Intravenous Every 24 hours 06/25/20 0933     06/24/20 1000  cefTRIAXone (ROCEPHIN) 2 g in sodium chloride 0.9 % 100 mL IVPB  Status:  Discontinued        2 g 200 mL/hr over 30 Minutes Intravenous Every 24 hours 06/23/20 1703 06/25/20 0933   06/24/20 0000  metroNIDAZOLE (FLAGYL) IVPB 500 mg  Status:  Discontinued        500 mg 100 mL/hr over 60 Minutes Intravenous Every 8  hours 06/23/20 1703 06/25/20 0933   06/23/20 1445  cefTRIAXone (ROCEPHIN) 2 g in sodium chloride 0.9 % 100 mL IVPB        2 g 200 mL/hr over 30 Minutes Intravenous  Once 06/23/20 1432 06/23/20 1629   06/23/20 1430  levofloxacin (LEVAQUIN) IVPB 750 mg  Status:  Discontinued        750 mg 100 mL/hr over 90 Minutes Intravenous  Once 06/23/20 1423 06/23/20 1432   06/23/20 1430  metroNIDAZOLE (FLAGYL) IVPB 500 mg        500 mg 100 mL/hr over 60 Minutes Intravenous  Once 06/23/20 1423 06/23/20 1732      Assessment/Plan:    LOS: 4 days  Adrenal failure on chronic steroids Asthma Fibromyalgia RA CHF 3.4 cm L ovarian cyst - seen incidentally on CT   Sigmoid diverticulitis with perforation -still with pain today, that she seems to state is no better yet. -WBC down  AF -will try to adjust her meds to Invanz if pharmacy will allow given her multitude of allergies to see if this will help -given her use of steroids and age, would like to avoid surgery if able -Advance to soft diet  FEN -soft diet VTE -lovenox ID -invanz Can transition to Augmentin for total of 7 more days 875 mg p.o. twice daily  Follow-up with primary care   Turner Daniels MD  06/27/2020

## 2020-06-27 NOTE — Progress Notes (Signed)
PROGRESS NOTE    Eileen Wilkerson  WCB:[id number] DOB: 1932/10/07 DOA: 06/23/2020 PCP: [name], MD   Chef Complaints: Abdominal pain x2 to 3 days  Brief Narrative: 86 y.o. female with medical history significant of RA, hypothyroid, asthma who presents with abd pain over the past 2-3 days. Patient reports initially having decreased appetite on 7/12 with no other notable symptoms. On 7/13, patient experienced marked and persistent abd pain across the abdomen. Symptoms persisted overnight and patient subsequently presented to the ED for further work up. Denies chest pain or sob. Did report a temp as high as 100.1 prior to visit. Pt also reported several bouts of diarrhea associated with abd discomfort  ED Course: In the ED, pt was found to be afebrile. Presenting HR was up to 109 bpm and RR as high as 22. WBC noted to be over 17k with CT abd/pelvis demonstrating acute sigmoid diverticulitis with pneumoperitoneum, reviewed.  General surgery was consulted and patient was admitted for further management.    Subjective:  Minimal pain in abdomen - attributes it to need to have BM everytime she pees she has liquid stool she says Overall feeling better, no nausea vomiting Requesting to advance diet son was killed 7/16 and anxious to go home soon  Assessment & Plan:  Sigmoid colon diverticulitis with perforation and sepsis POA: Clinically feels better having liquidy bowel movement, no nausea vomiting tolerating liquid diet wants to advance diet, await for surgery input.  On IV Invanz.   Recent Labs  Lab 06/23/20 1323 06/24/20 0501 06/25/20 0849 06/26/20 0518 06/27/20 0557  WBC 17.3* 12.9* 10.9* 9.5 8.1   Hypokalemia at 3.2- will add po kcl this am Recent Labs  Lab 06/23/20 1323 06/24/20 0501 06/25/20 0941 06/26/20 0518 06/27/20 0557  K 3.5 3.2* 3.1* 4.1 3.2*   Rheumatoid arthritis on chronic steroid continue the same continue PPI.    DJD,cervical: Pain is  controlled..  Pure hypercholesterolemia: Continue her statin.  DVT prophylaxis: enoxaparin (LOVENOX) injection 40 mg Start: 06/24/20 0800 Code Status:Full code Family Communication:plan of care discussed with patient at bedside. I had called and updated her daughter [name] previously.  Status is: Inpatient Remains inpatient appropriate because:IV treatments appropriate due to intensity of illness or inability to take PO and Inpatient level of care appropriate due to severity of illness.  Remains/plans for ongoing IV antibiotics and advancement of her diet to make sure she is tolerating Dispo:The patient is from: Home.            Anticipated d/c is to: Home.            Anticipated d/c date is: 1 days once cleared by surgery/once tolerating diet.            Patient currently is not medically stable to d/c. Diet Order            DIET SOFT Room service appropriate? Yes; Fluid consistency: Thin  Diet effective now                 Body mass index is 29.52 kg/m.  Consultants:see note  Procedures:see note Microbiology:see note  Medications: Scheduled Meds: . acetaminophen  1,000 mg Oral Q6H  . atorvastatin  20 mg Oral Daily  . cycloSPORINE  1 drop Both Eyes BID  . enoxaparin (LOVENOX) injection  40 mg Subcutaneous Q24H  . fluticasone  2 spray Each Nare Daily  . montelukast  10 mg Oral QHS  . pantoprazole  40 mg Oral Daily  .  predniSONE  10 mg Oral Q breakfast   Continuous Infusions: . ertapenem 1,000 mg (06/27/20 1049)  . lactated ringers 75 mL/hr at 06/27/20 0600    Antimicrobials: Anti-infectives (From admission, onward)   Start     Dose/Rate Route Frequency Ordered Stop   06/25/20 1000  ertapenem (INVANZ) 1,000 mg in sodium chloride 0.9 % 100 mL IVPB     Discontinue     1 g 200 mL/hr over 30 Minutes Intravenous Every 24 hours 06/25/20 0933     06/24/20 1000  cefTRIAXone (ROCEPHIN) 2 g in sodium chloride 0.9 % 100 mL IVPB  Status:  Discontinued        2 g 200 mL/hr  over 30 Minutes Intravenous Every 24 hours 06/23/20 1703 06/25/20 0933   06/24/20 0000  metroNIDAZOLE (FLAGYL) IVPB 500 mg  Status:  Discontinued        500 mg 100 mL/hr over 60 Minutes Intravenous Every 8 hours 06/23/20 1703 06/25/20 0933   06/23/20 1445  cefTRIAXone (ROCEPHIN) 2 g in sodium chloride 0.9 % 100 mL IVPB        2 g 200 mL/hr over 30 Minutes Intravenous  Once 06/23/20 1432 06/23/20 1629   06/23/20 1430  levofloxacin (LEVAQUIN) IVPB 750 mg  Status:  Discontinued        750 mg 100 mL/hr over 90 Minutes Intravenous  Once 06/23/20 1423 06/23/20 1432   06/23/20 1430  metroNIDAZOLE (FLAGYL) IVPB 500 mg        500 mg 100 mL/hr over 60 Minutes Intravenous  Once 06/23/20 1423 06/23/20 1732       Objective: Vitals: Today's Vitals   06/26/20 2123 06/27/20 0508 06/27/20 0544 06/27/20 0811  BP: (!) 148/89  (!) 157/93   Pulse: 82  88   Resp: 16  16   Temp: 98.3 F (36.8 C)  98.1 F (36.7 C)   TempSrc: Oral  Oral   SpO2: 98%  95%   Weight:      Height:      PainSc:  6  2  0-No pain    Intake/Output Summary (Last 24 hours) at 06/27/2020 1214 Last data filed at 06/27/2020 0900 Gross per 24 hour  Intake 1598.23 ml  Output 1550 ml  Net 48.23 ml   Filed Weights   06/23/20 1322  Weight: 78 kg   Weight change:    Intake/Output from previous day: 07/17 0701 - 07/18 0700 In: 1809 [I.V.:1709; IV Piggyback:100] Out: 1400 [Urine:1400] Intake/Output this shift: Total I/O In: 120 [P.O.:120] Out: 400 [Urine:400]  Examination: General exam: AAOx3,NAD,weak appearing. HEENT:Oral mucosa moist, Ear/Nose WNL grossly, dentition normal. Respiratory system: bilaterally clear,no wheezing or crackles,no use of accessory muscle. Cardiovascular system: S1 & S2 +, No JVD. Gastrointestinal system: Abdomen soft, NT,ND,BS+. Nervous System:Alert, awake, moving extremities and grossly nonfocal. Extremities: No edema, distal peripheral pulses palpable.  Skin: No rashes,no icterus. MSK:  Normal muscle bulk,tone, power.  Data Reviewed: I have personally reviewed following labs and imaging studies CBC: Recent Labs  Lab 06/23/20 1323 06/24/20 0501 06/25/20 0849 06/26/20 0518 06/27/20 0557  WBC 17.3* 12.9* 10.9* 9.5 8.1  NEUTROABS 12.8*  --  7.8*  --   --   HGB 13.6 14.3 11.5* 11.8* 12.3  HCT 42.4 46.0 37.1 38.2 38.5  MCV 100.2* 100.9* 102.2* 101.6* 99.5  PLT 272 208 256 262 081   Basic Metabolic Panel: Recent Labs  Lab 06/23/20 1323 06/24/20 0501 06/25/20 0941 06/26/20 0518 06/27/20 0557  NA 139 144 144 143  146*  K 3.5 3.2* 3.1* 4.1 3.2*  CL 96* 106 111 113* 111  CO2 27 25 22  20* 25  GLUCOSE 108* 76 78 89 87  BUN 11 10 10 8  6*  CREATININE 0.95 0.79 0.66 0.68 0.66  CALCIUM 9.0 8.4* 8.2* 8.7* 9.3   GFR: Estimated Creatinine Clearance: 51.9 mL/min (by C-G formula based on SCr of 0.66 mg/dL). Liver Function Tests: Recent Labs  Lab 06/23/20 1323 06/25/20 0941  AST 18 20  ALT 15 14  ALKPHOS 49 43  BILITOT 1.2 1.1  PROT 7.3 6.3*  ALBUMIN 3.3* 2.7*   Recent Labs  Lab 06/23/20 1323  LIPASE 21   No results for input(s): AMMONIA in the last 168 hours. Coagulation Profile: No results for input(s): INR, PROTIME in the last 168 hours. Cardiac Enzymes: No results for input(s): CKTOTAL, CKMB, CKMBINDEX, TROPONINI in the last 168 hours. BNP (last 3 results) No results for input(s): PROBNP in the last 8760 hours. HbA1C: No results for input(s): HGBA1C in the last 72 hours. CBG: No results for input(s): GLUCAP in the last 168 hours. Lipid Profile: No results for input(s): CHOL, HDL, LDLCALC, TRIG, CHOLHDL, LDLDIRECT in the last 72 hours. Thyroid Function Tests: No results for input(s): TSH, T4TOTAL, FREET4, T3FREE, THYROIDAB in the last 72 hours. Anemia Panel: No results for input(s): VITAMINB12, FOLATE, FERRITIN, TIBC, IRON, RETICCTPCT in the last 72 hours. Sepsis Labs: Recent Labs  Lab 06/23/20 1323  LATICACIDVEN 1.5    Recent Results (from  the past 240 hour(s))  SARS Coronavirus 2 by RT PCR (hospital order, performed in [facility] hospital lab) Nasopharyngeal Nasopharyngeal Swab     Status: None   Collection Time: 06/23/20  1:39 PM   Specimen: Nasopharyngeal Swab  Result Value Ref Range Status   SARS Coronavirus 2 NEGATIVE NEGATIVE Final    Comment: (NOTE) SARS-CoV-2 target nucleic acids are NOT DETECTED.  The SARS-CoV-2 RNA is generally detectable in upper and lower respiratory specimens during the acute phase of infection. The lowest concentration of SARS-CoV-2 viral copies this assay can detect is 250 copies / mL. A negative result does not preclude SARS-CoV-2 infection and should not be used as the sole basis for treatment or other patient management decisions.  A negative result may occur with improper specimen collection / handling, submission of specimen other than nasopharyngeal swab, presence of viral mutation(s) within the areas targeted by this assay, and inadequate number of viral copies (<250 copies / mL). A negative result must be combined with clinical observations, patient history, and epidemiological information.  Fact Sheet for Patients:   StrictlyIdeas.no  Fact Sheet for Healthcare Providers: BankingDealers.co.za  This test is not yet approved or  cleared by the [place] FDA and has been authorized for detection and/or diagnosis of SARS-CoV-2 by FDA under an Emergency Use Authorization (EUA).  This EUA will remain in effect (meaning this test can be used) for the duration of the COVID-19 declaration under Section 564(b)(1) of the Act, 21 U.S.C. section 360bbb-3(b)(1), unless the authorization is terminated or revoked sooner.  Performed at [facility], [place] [address] [place], [place] [address]   Culture, blood (routine x 2)     Status: None (Preliminary result)   Collection Time: 06/23/20  2:17 PM   Specimen: BLOOD   Result Value Ref Range Status   Specimen Description   Final    BLOOD RIGHT ANTECUBITAL Performed at [facility] [address] [place], [place] [address]  Special Requests   Final    BOTTLES DRAWN AEROBIC AND ANAEROBIC Blood Culture results may not be optimal due to an inadequate volume of blood received in culture bottles Performed at [facility] [address], [place], [place] [address]    Culture   Final    NO GROWTH 3 DAYS Performed at [facility], [place] [address] [place], [place] [address]    Report Status PENDING  Incomplete  Culture, blood (routine x 2)     Status: None (Preliminary result)   Collection Time: 06/23/20  2:17 PM   Specimen: BLOOD  Result Value Ref Range Status   Specimen Description   Final    BLOOD LEFT ANTECUBITAL Performed at [facility] [address], [place], [place] [address]    Special Requests   Final    BOTTLES DRAWN AEROBIC AND ANAEROBIC Blood Culture adequate volume Performed at [facility] [address] [place], [place] [address]    Culture   Final    NO GROWTH 3 DAYS Performed at [facility], [place] [address], [place], [place] [address]    Report Status PENDING  Incomplete  Urine culture     Status: None   Collection Time: 06/24/20  4:00 AM   Specimen: Urine, Clean Catch  Result Value Ref Range Status   Specimen Description   Final    URINE, CLEAN CATCH Performed at [facility], [place] [address], [place], [place] [address]    Special Requests   Final    NONE Performed at [facility], [place] [address], [place], [place] [address]    Culture   Final    NO GROWTH Performed at [facility], [place] [address], [place], [place] [address]    Report Status 06/25/2020 FINAL  Final      Radiology Studies: No results found.   LOS: 4 days   Antonieta Pert, MD [facility]  06/27/2020, 12:14 PM

## 2020-06-28 LAB — CBC
HCT: 38.5 % (ref 36.0–46.0)
Hemoglobin: 12.2 g/dL (ref 12.0–15.0)
MCH: 31.3 pg (ref 26.0–34.0)
MCHC: 31.7 g/dL (ref 30.0–36.0)
MCV: 98.7 fL (ref 80.0–100.0)
Platelets: 278 10*3/uL (ref 150–400)
RBC: 3.9 MIL/uL (ref 3.87–5.11)
RDW: 13.5 % (ref 11.5–15.5)
WBC: 9.4 10*3/uL (ref 4.0–10.5)
nRBC: 0 % (ref 0.0–0.2)

## 2020-06-28 LAB — BASIC METABOLIC PANEL
Anion gap: 18 — ABNORMAL HIGH (ref 5–15)
Anion gap: 11 (ref 5–15)
BUN: 7 mg/dL — ABNORMAL LOW (ref 8–23)
BUN: 9 mg/dL (ref 8–23)
CO2: 22 mmol/L (ref 22–32)
CO2: 27 mmol/L (ref 22–32)
Calcium: 9.5 mg/dL (ref 8.9–10.3)
Calcium: 9.4 mg/dL (ref 8.9–10.3)
Chloride: 106 mmol/L (ref 98–111)
Chloride: 105 mmol/L (ref 98–111)
Creatinine, Ser: 0.57 mg/dL (ref 0.44–1.00)
Creatinine, Ser: 0.63 mg/dL (ref 0.44–1.00)
GFR calc Af Amer: >60 mL/min (ref >60)
GFR calc Af Amer: >60 mL/min (ref >60)
GFR calc non Af Amer: >60 mL/min (ref >60)
GFR calc non Af Amer: >60 mL/min (ref >60)
Glucose, Bld: 90 mg/dL (ref 70–99)
Glucose, Bld: 124 mg/dL — ABNORMAL HIGH (ref 70–99)
Potassium: 2.9 mmol/L — ABNORMAL LOW (ref 3.5–5.1)
Potassium: 4.4 mmol/L (ref 3.5–5.1)
Sodium: 146 mmol/L — ABNORMAL HIGH (ref 135–145)
Sodium: 143 mmol/L (ref 135–145)

## 2020-06-28 LAB — CULTURE, BLOOD (ROUTINE X 2)
Culture: NO GROWTH
Culture: NO GROWTH
Special Requests: ADEQUATE

## 2020-06-28 MED ORDER — POTASSIUM CHLORIDE CRYS ER 20 MEQ PO TBCR
40.0000 meq | EXTENDED_RELEASE_TABLET | ORAL | Status: AC
  Administered 2020-06-28 (×2): 40 meq via ORAL
  Filled 2020-06-28 (×2): qty 2

## 2020-06-28 MED ORDER — TRAMADOL HCL 50 MG PO TABS
50.0000 mg | ORAL_TABLET | Freq: Four times a day (QID) | ORAL | Status: DC
  Administered 2020-06-28 – 2020-06-29 (×6): 50 mg via ORAL
  Filled 2020-06-28 (×6): qty 1

## 2020-06-28 NOTE — TOC Initial Note (Signed)
Transition of Care [facility]) - Initial/Assessment Note    Patient Details  Name: Eileen Wilkerson MRN: [id number] Date of Birth: [birth year]  [facility] CM/SW Contact:    Lia Hopping, [place] Phone Number: 06/28/2020, 3:33 PM  Clinical Narrative:                 CSW met with the patient at bedside to discuss discharge plan back to [facility]. Patient reports she Independent at home and hopeful to return home. Patient reports she is active with [facility] for [facility] at this time. Patient would like to continue services.  CSW reached out to [place] at [facility] to coordinate patient continuation of care with [facility]. CSW waiting for a response.   TOC staff will continue to follow for discharge needs.   Expected Discharge Plan: [place] Barriers to Discharge: Barriers Resolved   Patient Goals and CMS Choice   CMS Medicare.gov Compare Post Acute Care list provided to:: Patient Choice offered to / list presented to : NA  Expected Discharge Plan and Services Expected Discharge Plan: [place] In-house Referral: Clinical Social Work Discharge Planning Services: CM Consult Post Acute Care Choice: [place] Living arrangements for the past 2 months: [place]                 DME Arranged: N/A DME Agency: NA       HH Arranged: PT          Prior Living Arrangements/Services Living arrangements for the past 2 months: [place] Lives with:: Facility Resident Patient language and need for interpreter reviewed:: No Do you feel safe going back to the place where you live?: Yes      Need for Family Participation in Patient Care: Yes (Comment) Care giver support system in place?: Yes (comment) (Daughter) Current home services: Home PT Criminal Activity/Legal Involvement Pertinent to Current Situation/Hospitalization: No - Comment as  needed  Activities of Daily Living Home Assistive Devices/Equipment: Eyeglasses, Cane (specify quad or straight), Hearing aid ADL Screening (condition at time of admission) Patient's cognitive ability adequate to safely complete daily activities?: Yes Is the patient deaf or have difficulty hearing?: Yes (use hearing aids) Does the patient have difficulty seeing, even when wearing glasses/contacts?: No Does the patient have difficulty concentrating, remembering, or making decisions?: No Patient able to express need for assistance with ADLs?: Yes Does the patient have difficulty dressing or bathing?: Yes Independently performs ADLs?: No Communication: Independent Dressing (OT): Independent with device (comment) Grooming: Independent with device (comment) Feeding: Independent Bathing: Independent with device (comment) Toileting: Independent with device (comment) In/Out Bed: Independent with device (comment) Walks in Home: Independent with device (comment) Does the patient have difficulty walking or climbing stairs?: Yes Weakness of Legs: Both Weakness of Arms/Hands: None  Permission Sought/Granted Permission sought to share information with : Family Supports Permission granted to share information with : Yes, Verbal Permission Granted  Share Information with NAME: Debrew,Jacqueline Dr.  Software engineer granted to share info w AGENCY: [place] granted to share info w Relationship: Daughter  Permission granted to share info w Contact Information: [phone]  [phone]  Emotional Assessment Appearance:: Appears stated age Attitude/Demeanor/Rapport: Engaged Affect (typically observed): Accepting Orientation: : Oriented to Self, Oriented to Place, Oriented to  Time, Oriented to Situation Alcohol / Substance Use: Not Applicable Psych Involvement: No (comment)  Admission diagnosis:  Perforation of sigmoid  colon due to diverticulitis [K57.20] Patient Active Problem  List   Diagnosis Date Noted  . Perforation of sigmoid colon due to diverticulitis 06/23/2020  . Hair loss 08/06/2019  . SOB (shortness of breath) on exertion 08/06/2019  . Pure hypercholesterolemia 08/06/2019  . B12 deficiency 03/11/2018  . Idiopathic chronic venous hypertension of both lower extremities with ulcer and inflammation ([place]) 09/18/2017  . Bilateral lower extremity edema 06/30/2017  . Physical deconditioning 05/30/2017  . Immunosuppressed status ([place]) 05/30/2017  . Abnormal chest x-ray 05/17/2017  . Sepsis ([place]) 04/25/2017  . Fibromyalgia 12/18/2016  . DJD (degenerative joint disease), cervical 12/18/2016  . Spondylosis of lumbar region without myelopathy or radiculopathy 12/18/2016  . Trigger finger, right middle finger 12/18/2016  . Primary osteoarthritis of both feet 12/18/2016  . Primary osteoarthritis of both knees 12/18/2016  . GERD (gastroesophageal reflux disease) 12/18/2016  . Age-related osteoporosis without current pathological fracture 12/18/2016  . Rheumatoid arthritis ([place]) 12/28/2015  . Long term current use of systemic steroids, for RA 12/28/2015  . Lymphocytosis 11/13/2012   PCP:  [name], MD Pharmacy:   [id number]: [phone]: [phone]    Social Determinants of Health (SDOH) Interventions    Readmission Risk Interventions No flowsheet data found.

## 2020-06-28 NOTE — Progress Notes (Signed)
Physical Therapy Treatment Patient Details Name: Eileen Wilkerson MRN: [id number] DOB: 01-Feb-1932 Today's Date: 06/28/2020    History of Present Illness Pt is 86 yo female with PMH including RA, hypothyroidism, and asthma.  Pt presented to ED with abd pain over the past 2-3 days. Pt admitted with sigmoid colon diverticulitis with perforation and sepsis.  Pt currently being managed conservately without surgery if possible.    PT Comments    Pt progressing well, incr amb distance/tolerance  Today. Pt is motivated to return to [facility] at [facility]  Follow Up Recommendations  Home health PT;Other (comment) (return to ILF)     Equipment Recommendations  None recommended by PT    Recommendations for Other Services       Precautions / Restrictions Precautions Precautions: Fall Restrictions Weight Bearing Restrictions: No    Mobility  Bed Mobility   Bed Mobility: Supine to Sit     Supine to sit: Min guard;HOB elevated     General bed mobility comments: use of rail, incr time, min/guard for safety   Transfers Overall transfer level: Needs assistance Equipment used: Rolling walker (2 wheeled) Transfers: Sit to/from Stand Sit to Stand: Min guard         General transfer comment: min/guard for safety, cues for hand placement   Ambulation/Gait Ambulation/Gait assistance: Min guard Gait Distance (Feet): 80 Feet Assistive device: Rolling walker (2 wheeled) Gait Pattern/deviations: Step-through pattern;Decreased stride length;Trunk flexed Gait velocity: decreased   General Gait Details: cues for RW use and posture   Stairs             Wheelchair Mobility    Modified Rankin (Stroke Patients Only)       Balance                                            Cognition Arousal/Alertness: Awake/alert Behavior During Therapy: WFL for tasks assessed/performed Overall Cognitive Status: Within Functional Limits for tasks assessed                                         Exercises      General Comments        Pertinent Vitals/Pain Pain Assessment: Faces Faces Pain Scale: Hurts little more Pain Location: Legs sore from arthritis, fibromyalgia Pain Descriptors / Indicators: Sore Pain Intervention(s): Limited activity within patient's tolerance;Monitored during session;Repositioned    Home Living                      Prior Function            PT Goals (current goals can now be found in the care plan section) Acute Rehab PT Goals Patient Stated Goal: To go home by Monday PT Goal Formulation: With patient Time For Goal Achievement: 07/10/20 Potential to Achieve Goals: Good Progress towards PT goals: Progressing toward goals    Frequency    Min 3X/week      PT Plan Current plan remains appropriate    Co-evaluation              AM-PAC PT "6 Clicks" Mobility   Outcome Measure  Help needed turning from your back to your side while in a flat bed without using bedrails?: None Help needed moving from lying on your back  to sitting on the side of a flat bed without using bedrails?: A Little Help needed moving to and from a bed to a chair (including a wheelchair)?: A Little Help needed standing up from a chair using your arms (e.g., wheelchair or bedside chair)?: A Little Help needed to walk in hospital room?: A Little Help needed climbing 3-5 steps with a railing? : A Little 6 Click Score: 19    End of Session Equipment Utilized During Treatment: Gait belt Activity Tolerance: Patient tolerated treatment well Patient left: in chair;with call bell/phone within reach;with chair alarm set   PT Visit Diagnosis: Muscle weakness (generalized) (M62.81);Other abnormalities of gait and mobility (R26.89)     Time: 4707-6151 PT Time Calculation (min) (ACUTE ONLY): 20 min  Charges:  $Gait Training: 8-22 mins                     Baxter Flattery, PT  Acute Rehab Dept ([place]) [phone] Pager  [phone]  06/28/2020    [facility] 06/28/2020, 1:14 PM

## 2020-06-28 NOTE — Progress Notes (Signed)
PROGRESS NOTE    Eileen Wilkerson  HER:[id number] DOB: [birth year] DOA: 06/23/2020 PCP: [name], MD   Chef Complaints: Abdominal pain x2 to 3 days  Brief Narrative: 86 y.o. female with medical history significant of RA, hypothyroid, asthma who presents with abd pain over the past 2-3 days. Patient reports initially having decreased appetite on 7/12 with no other notable symptoms. On 7/13, patient experienced marked and persistent abd pain across the abdomen. Symptoms persisted overnight and patient subsequently presented to the ED for further work up. Denies chest pain or sob. Did report a temp as high as 100.1 prior to visit. Pt also reported several bouts of diarrhea associated with abd discomfort  ED Course: In the ED, pt was found to be afebrile. Presenting HR was up to 109 bpm and RR as high as 22. WBC noted to be over 17k with CT abd/pelvis demonstrating acute sigmoid diverticulitis with pneumoperitoneum, reviewed.  General surgery was consulted and patient was admitted for further management.    Subjective:  Hurting on legs from RA takes tramadol at home. Denies abdomen pain nasuea or vomiting Tolerating soft diet-but p.o. intake is poor. Last 2 urination- had no associated diarrhea son was killed 7/16 and anxious to go home soon  Assessment & Plan:  Sigmoid colon diverticulitis with perforation and sepsis POA: Clinically feeling well, abdomen pain is improved, leukocytosis resolved, afebrile.Has been having liquid bowel movement with voiding however none in last 2 voids.  On soft diet, IV Invanz.Discussed with surgery-dietitian consulted to help with nutrition/low fiber diet and plan on discharge with oral Omnicef/Flagyl to complete total 14 days course.   Recent Labs  Lab 06/24/20 0501 06/25/20 0849 06/26/20 0518 06/27/20 0557 06/28/20 0542  WBC 12.9* 10.9* 9.5 8.1 9.4   Hypokalemia at at 2.9.  We will aggressively replete again today and repeat bmp in evening.     Recent Labs  Lab 06/24/20 0501 06/25/20 0941 06/26/20 0518 06/27/20 0557 06/28/20 0542  K 3.2* 3.1* 4.1 3.2* 2.9*   Rheumatoid arthritis on chronic prednisone 10 mg.  Patient also reports that her prednisone is also for  maintenance dose for her adrenal insufficiency. Cont PPI.Patient takes tramadol for pain and we are resuming.  DJD, cervical: Pain is controlled.Continue home tramadol.  Pure hypercholesterolemia: Continue her statin.  DVT prophylaxis: enoxaparin (LOVENOX) injection 40 mg Start: 06/24/20 0800 Code Status:Full code Family Communication:plan of care discussed with patient at bedside. I had called and updated her daughter [name] previously.  Status is: Inpatient Remains inpatient appropriate because:IV treatments appropriate due to intensity of illness or inability to take PO and Inpatient level of care appropriate due to severity of illness.  Remains/plans for ongoing IV antibiotics and advancement of her diet to make sure she is tolerating Dispo:The patient is from: Home.            Anticipated d/c is to: Home with [facility].            Anticipated d/c date is: Tomorrow if tolerating diet today. Discussed with surgery team.Patient reports her daughter is out of town today.            Patient currently is not medically stable to d/c. Diet Order            DIET SOFT Room service appropriate? Yes; Fluid consistency: Thin  Diet effective now                 Body mass index is 29.52 kg/m.  Consultants:see note.  Procedures:see note. Microbiology:see note.  Medications: Scheduled Meds: . acetaminophen  1,000 mg Oral Q6H  . atorvastatin  20 mg Oral Daily  . cycloSPORINE  1 drop Both Eyes BID  . enoxaparin (LOVENOX) injection  40 mg Subcutaneous Q24H  . fluticasone  2 spray Each Nare Daily  . montelukast  10 mg Oral QHS  . pantoprazole  40 mg Oral Daily  . predniSONE  10 mg Oral Q breakfast  Continuous Infusions: . ertapenem 1,000 mg (06/27/20 1049)  .  lactated ringers 75 mL/hr at 06/28/20 0412  Antimicrobials: Anti-infectives (From admission, onward)   Start     Dose/Rate Route Frequency Ordered Stop   06/25/20 1000  ertapenem (INVANZ) 1,000 mg in sodium chloride 0.9 % 100 mL IVPB     Discontinue     1 g 200 mL/hr over 30 Minutes Intravenous Every 24 hours 06/25/20 0933     06/24/20 1000  cefTRIAXone (ROCEPHIN) 2 g in sodium chloride 0.9 % 100 mL IVPB  Status:  Discontinued        2 g 200 mL/hr over 30 Minutes Intravenous Every 24 hours 06/23/20 1703 06/25/20 0933   06/24/20 0000  metroNIDAZOLE (FLAGYL) IVPB 500 mg  Status:  Discontinued        500 mg 100 mL/hr over 60 Minutes Intravenous Every 8 hours 06/23/20 1703 06/25/20 0933   06/23/20 1445  cefTRIAXone (ROCEPHIN) 2 g in sodium chloride 0.9 % 100 mL IVPB        2 g 200 mL/hr over 30 Minutes Intravenous  Once 06/23/20 1432 06/23/20 1629   06/23/20 1430  levofloxacin (LEVAQUIN) IVPB 750 mg  Status:  Discontinued        750 mg 100 mL/hr over 90 Minutes Intravenous  Once 06/23/20 1423 06/23/20 1432   06/23/20 1430  metroNIDAZOLE (FLAGYL) IVPB 500 mg        500 mg 100 mL/hr over 60 Minutes Intravenous  Once 06/23/20 1423 06/23/20 1732       Objective: Vitals: Today's Vitals   06/27/20 1520 06/27/20 2045 06/27/20 2155 06/28/20 0602  BP: (!) 143/82  (!) 147/89 (!) 132/98  Pulse: 100  88 97  Resp: 20  16 16   Temp: 97.7 F (36.5 C)  97.9 F (36.6 C) 97.7 F (36.5 C)  TempSrc: Oral  Oral Oral  SpO2: 96%  95% 95%  Weight:      Height:      PainSc:  5       Intake/Output Summary (Last 24 hours) at 06/28/2020 0740 Last data filed at 06/28/2020 0600 Gross per 24 hour  Intake 2212.6 ml  Output 400 ml  Net 1812.6 ml   Filed Weights   06/23/20 1322  Weight: 78 kg   Weight change:    Intake/Output from previous day: 07/18 0701 - 07/19 0700 In: 2212.6 [P.O.:360; I.V.:1752.6; IV Piggyback:100] Out: 400 [Urine:400] Intake/Output this shift: No intake/output data  recorded.  Examination: General exam: AAOx3, NAD, weak appearing. HEENT:Oral mucosa moist, Ear/Nose WNL grossly, dentition normal. Respiratory system: bilaterally clear,no wheezing or crackles,no use of accessory muscle Cardiovascular system: S1 & S2 +, No JVD,. Gastrointestinal system: Abdomen soft, NT,ND, BS+ Nervous System:Alert, awake, moving extremities and grossly nonfocal Extremities: No edema, distal peripheral pulses palpable.  Skin: No rashes,no icterus. MSK: Normal muscle bulk,tone, power  Data Reviewed: I have personally reviewed following labs and imaging studies CBC: Recent Labs  Lab 06/23/20 1323 06/23/20 1323 06/24/20 0501 06/25/20 5638 06/26/20 0518 06/27/20 7564  06/28/20 0542  WBC 17.3*   < > 12.9* 10.9* 9.5 8.1 9.4  NEUTROABS 12.8*  --   --  7.8*  --   --   --   HGB 13.6   < > 14.3 11.5* 11.8* 12.3 12.2  HCT 42.4   < > 46.0 37.1 38.2 38.5 38.5  MCV 100.2*   < > 100.9* 102.2* 101.6* 99.5 98.7  PLT 272   < > 208 256 262 272 278   < > = values in this interval not displayed.   Basic Metabolic Panel: Recent Labs  Lab 06/24/20 0501 06/25/20 0941 06/26/20 0518 06/27/20 0557 06/28/20 0542  NA 144 144 143 146* 146*  K 3.2* 3.1* 4.1 3.2* 2.9*  CL 106 111 113* 111 106  CO2 25 22 20* 25 22  GLUCOSE 76 78 89 87 90  BUN 10 10 8  6* 7*  CREATININE 0.79 0.66 0.68 0.66 0.57  CALCIUM 8.4* 8.2* 8.7* 9.3 9.5   GFR: Estimated Creatinine Clearance: 51.9 mL/min (by C-G formula based on SCr of 0.57 mg/dL). Liver Function Tests: Recent Labs  Lab 06/23/20 1323 06/25/20 0941  AST 18 20  ALT 15 14  ALKPHOS 49 43  BILITOT 1.2 1.1  PROT 7.3 6.3*  ALBUMIN 3.3* 2.7*   Recent Labs  Lab 06/23/20 1323  LIPASE 21   No results for input(s): AMMONIA in the last 168 hours. Coagulation Profile: No results for input(s): INR, PROTIME in the last 168 hours. Cardiac Enzymes: No results for input(s): CKTOTAL, CKMB, CKMBINDEX, TROPONINI in the last 168 hours. BNP (last 3  results) No results for input(s): PROBNP in the last 8760 hours. HbA1C: No results for input(s): HGBA1C in the last 72 hours. CBG: No results for input(s): GLUCAP in the last 168 hours. Lipid Profile: No results for input(s): CHOL, HDL, LDLCALC, TRIG, CHOLHDL, LDLDIRECT in the last 72 hours. Thyroid Function Tests: No results for input(s): TSH, T4TOTAL, FREET4, T3FREE, THYROIDAB in the last 72 hours. Anemia Panel: No results for input(s): VITAMINB12, FOLATE, FERRITIN, TIBC, IRON, RETICCTPCT in the last 72 hours. Sepsis Labs: Recent Labs  Lab 06/23/20 1323  LATICACIDVEN 1.5    Recent Results (from the past 240 hour(s))  SARS Coronavirus 2 by RT PCR (hospital order, performed in [facility] hospital lab) Nasopharyngeal Nasopharyngeal Swab     Status: None   Collection Time: 06/23/20  1:39 PM   Specimen: Nasopharyngeal Swab  Result Value Ref Range Status   SARS Coronavirus 2 NEGATIVE NEGATIVE Final    Comment: (NOTE) SARS-CoV-2 target nucleic acids are NOT DETECTED.  The SARS-CoV-2 RNA is generally detectable in upper and lower respiratory specimens during the acute phase of infection. The lowest concentration of SARS-CoV-2 viral copies this assay can detect is 250 copies / mL. A negative result does not preclude SARS-CoV-2 infection and should not be used as the sole basis for treatment or other patient management decisions.  A negative result may occur with improper specimen collection / handling, submission of specimen other than nasopharyngeal swab, presence of viral mutation(s) within the areas targeted by this assay, and inadequate number of viral copies (<250 copies / mL). A negative result must be combined with clinical observations, patient history, and epidemiological information.  Fact Sheet for Patients:   StrictlyIdeas.no  Fact Sheet for Healthcare Providers: BankingDealers.co.za  This test is not yet approved or   cleared by the [place] FDA and has been authorized for detection and/or diagnosis of SARS-CoV-2 by FDA under an  Emergency Use Authorization (EUA).  This EUA will remain in effect (meaning this test can be used) for the duration of the COVID-19 declaration under Section 564(b)(1) of the Act, 21 U.S.C. section 360bbb-3(b)(1), unless the authorization is terminated or revoked sooner.  Performed at [facility], [place] [address] [place], [place] [address]   Culture, blood (routine x 2)     Status: None (Preliminary result)   Collection Time: 06/23/20  2:17 PM   Specimen: BLOOD  Result Value Ref Range Status   Specimen Description   Final    BLOOD RIGHT ANTECUBITAL Performed at [facility] [address] [place], [place] [address]    Special Requests   Final    BOTTLES DRAWN AEROBIC AND ANAEROBIC Blood Culture results may not be optimal due to an inadequate volume of blood received in culture bottles Performed at [facility] [address] [place], [place] [address]    Culture   Final    NO GROWTH 4 DAYS Performed at [facility], [address] [place], [place] [address]    Report Status PENDING  Incomplete  Culture, blood (routine x 2)     Status: None (Preliminary result)   Collection Time: 06/23/20  2:17 PM   Specimen: BLOOD  Result Value Ref Range Status   Specimen Description   Final    BLOOD LEFT ANTECUBITAL Performed at [facility] [address], [place], [place] [address]    Special Requests   Final    BOTTLES DRAWN AEROBIC AND ANAEROBIC Blood Culture adequate volume Performed at [facility] [address] [place], [place] [address]    Culture   Final    NO GROWTH 4 DAYS Performed at [facility], [place] [address], [place], [place] [address]    Report Status PENDING  Incomplete  Urine culture     Status: None   Collection Time: 06/24/20  4:00 AM     Specimen: Urine, Clean Catch  Result Value Ref Range Status   Specimen Description   Final    URINE, CLEAN CATCH Performed at [facility], [place] [address], [place], [place] [address]    Special Requests   Final    NONE Performed at [facility], [place] [address] [place], [place] [address]    Culture   Final    NO GROWTH Performed at [facility], [place] [address] [place], [place] [address]    Report Status 06/25/2020 FINAL  Final      Radiology Studies: No results found.   LOS: 5 days   Antonieta Pert, MD [facility]  06/28/2020, 7:40 AM

## 2020-06-28 NOTE — Discharge Instructions (Signed)
Diverticulitis  Diverticulitis is when small pockets in your large intestine (colon) get infected or swollen. This causes stomach pain and watery poop (diarrhea). These pouches are called diverticula. They form in people who have a condition called diverticulosis. Follow these instructions at home: Medicines  Take over-the-counter and prescription medicines only as told by your doctor. These include: ? Antibiotics. ? Pain medicines. ? Fiber pills. ? Probiotics. ? Stool softeners.  Do not drive or use heavy machinery while taking prescription pain medicine.  If you were prescribed an antibiotic, take it as told. Do not stop taking it even if you feel better. General instructions   Follow a diet as told by your doctor.  When you feel better, your doctor may tell you to change your diet. You may need to eat a lot of fiber. Fiber makes it easier to poop (have bowel movements). Healthy foods with fiber include: ? Berries. ? Beans. ? Lentils. ? Green vegetables.  Exercise 3 or more times a week. Aim for 30 minutes each time. Exercise enough to sweat and make your heart beat faster.  Keep all follow-up visits as told. This is important. You may need to have an exam of the large intestine. This is called a colonoscopy. Contact a doctor if:  Your pain does not get better.  You have a hard time eating or drinking.  You are not pooping like normal. Get help right away if:  Your pain gets worse.  Your problems do not get better.  Your problems get worse very fast.  You have a fever.  You throw up (vomit) more than one time.  You have poop that is: ? Bloody. ? Black. ? Tarry. Summary  Diverticulitis is when small pockets in your large intestine (colon) get infected or swollen.  Take medicines only as told by your doctor.  Follow a diet as told by your doctor. This information is not intended to replace advice given to you by your health care provider. Make sure you  discuss any questions you have with your health care provider. Document Revised: 11/09/2017 Document Reviewed: 12/14/2016 Elsevier Patient Education  [place].   High-Fiber Diet Fiber, also called dietary fiber, is a type of carbohydrate that is found in fruits, vegetables, whole grains, and beans. A high-fiber diet can have many health benefits. Your health care provider may recommend a high-fiber diet to help:  Prevent constipation. Fiber can make your bowel movements more regular.  Lower your cholesterol.  Relieve the following conditions: ? Swelling of veins in the anus (hemorrhoids). ? Swelling and irritation (inflammation) of specific areas of the digestive tract (uncomplicated diverticulosis). ? A problem of the large intestine (colon) that sometimes causes pain and diarrhea (irritable bowel syndrome, IBS).  Prevent overeating as part of a weight-loss plan.  Prevent heart disease, type 2 diabetes, and certain cancers. What is my plan? The recommended daily fiber intake in grams (g) includes:  38 g for men age 84 or younger.  30 g for men over age 84.  69 g for women age 84 or younger.  21 g for women over age 84. You can get the recommended daily intake of dietary fiber by:  Eating a variety of fruits, vegetables, grains, and beans.  Taking a fiber supplement, if it is not possible to get enough fiber through your diet. What do I need to know about a high-fiber diet?  It is better to get fiber through food sources rather than from fiber supplements.  There is not a lot of research about how effective supplements are.  Always check the fiber content on the nutrition facts label of any prepackaged food. Look for foods that contain 5 g of fiber or more per serving.  Talk with a diet and nutrition specialist (dietitian) if you have questions about specific foods that are recommended or not recommended for your medical condition, especially if those foods are not  listed below.  Gradually increase how much fiber you consume. If you increase your intake of dietary fiber too quickly, you may have bloating, cramping, or gas.  Drink plenty of water. Water helps you to digest fiber. What are tips for following this plan?  Eat a wide variety of high-fiber foods.  Make sure that half of the grains that you eat each day are whole grains.  Eat breads and cereals that are made with whole-grain flour instead of refined flour or white flour.  Eat brown rice, bulgur wheat, or millet instead of white rice.  Start the day with a breakfast that is high in fiber, such as a cereal that contains 5 g of fiber or more per serving.  Use beans in place of meat in soups, salads, and pasta dishes.  Eat high-fiber snacks, such as berries, raw vegetables, nuts, and popcorn.  Choose whole fruits and vegetables instead of processed forms like juice or sauce. What foods can I eat?  Fruits Berries. Pears. Apples. Oranges. Avocado. Prunes and raisins. Dried figs. Vegetables Sweet potatoes. Spinach. Kale. Artichokes. Cabbage. Broccoli. Cauliflower. Green peas. Carrots. Squash. Grains Whole-grain breads. Multigrain cereal. Oats and oatmeal. Brown rice. Barley. Bulgur wheat. [place]. Quinoa. Bran muffins. Popcorn. Rye wafer crackers. Meats and other proteins Navy, kidney, and pinto beans. Soybeans. Split peas. Lentils. Nuts and seeds. Dairy Fiber-fortified yogurt. Beverages Fiber-fortified soy milk. Fiber-fortified orange juice. Other foods Fiber bars. The items listed above may not be a complete list of recommended foods and beverages. Contact a dietitian for more options. What foods are not recommended? Fruits Fruit juice. Cooked, strained fruit. Vegetables Fried potatoes. Canned vegetables. Well-cooked vegetables. Grains White bread. Pasta made with refined flour. White rice. Meats and other proteins Fatty cuts of meat. Fried chicken or fried fish. Dairy Milk.  Yogurt. Cream cheese. Sour cream. Fats and oils Butters. Beverages Soft drinks. Other foods Cakes and pastries. The items listed above may not be a complete list of foods and beverages to avoid. Contact a dietitian for more information. Summary  Fiber is a type of carbohydrate. It is found in fruits, vegetables, whole grains, and beans.  There are many health benefits of eating a high-fiber diet, such as preventing constipation, lowering blood cholesterol, helping with weight loss, and reducing your risk of heart disease, diabetes, and certain cancers.  Gradually increase your intake of fiber. Increasing too fast can result in cramping, bloating, and gas. Drink plenty of water while you increase your fiber.  The best sources of fiber include whole fruits and vegetables, whole grains, nuts, seeds, and beans. This information is not intended to replace advice given to you by your health care provider. Make sure you discuss any questions you have with your health care provider. Document Revised: 10/01/2017 Document Reviewed: 10/01/2017 Elsevier Patient Education  [place].  Low-Fiber Eating Plan Fiber is found in fruits, vegetables, whole grains, and beans. Eating a diet low in fiber helps to reduce how often you have bowel movements and how much you produce during a bowel movement. A low-fiber eating plan  may help your digestive system heal if:  You have certain conditions, such as Crohn's disease or diverticulitis.  You recently had radiation therapy on your pelvis or bowel.  You recently had intestinal surgery.  You have a new surgical opening in your abdomen (colostomy or ileostomy).  Your intestine is narrowed (stricture). Your health care provider will determine how long you need to stay on this diet. Your health care provider may recommend that you work with a diet and nutrition specialist (dietitian). What are tips for following this plan? General  guidelines  Follow recommendations from your dietitian about how much fiber you should have each day.  Most people on this eating plan should try to eat less than 10 grams (g) of fiber each day. Your daily fiber goal is _________________ g.  Take vitamin and mineral supplements as told by your health care provider or dietitian. Chewable or liquid forms are best when on this eating plan. Reading food labels  Check food labels for the amount of dietary fiber.  Choose foods that have less than 2 grams of fiber in one serving. Cooking  Use white flour and other allowed grains for baking and cooking.  Cook meat using methods that keep it tender, such as braising or poaching.  Cook eggs until the yolk is completely solid.  Cook with healthy oils, such as olive oil or canola oil. Meal planning   Eat 5-6 small meals throughout the day instead of 3 large meals.  If you are lactose intolerant: ? Choose low-lactose dairy foods. ? Do not eat dairy foods, if told by your dietitian.  Limit fat and oils to less than 8 teaspoons a day.  Eat small portions of desserts. What foods are allowed? The items listed below may not be a complete list. Talk with your dietitian about what dietary choices are best for you. Grains All bread and crackers made with white flour. Waffles, pancakes, and [place] toast. Bagels. Pretzels. Melba toast, zwieback, and matzoh. Cooked and dried cereals that do not contain whole grains, added fiber, seeds, or dried fruit. CornmealDomenick Gong. Hot and cold cereals made with refined corn, wheat, rice, or oats. Plain pasta and noodles. White rice. Vegetables Well-cooked or canned vegetables without skin, seeds, or stems. Cooked potatoes without skins. Vegetable juice. Fruits Soft-cooked or canned fruits without skin and seeds. Peeled ripe banana. Applesauce. Fruit juice without pulp. Meats and other protein foods Ground meat. Tender cuts of meat or poultry. Eggs. Fish,  seafood, and shellfish. Smooth nut butters. Tofu. Dairy All milk products and drinks. Lactose-free milks, including rice, soy, and almond milks. Yogurt without fruit, nuts, chocolate, or granola mix-ins. Sour cream. Cottage cheese. Cheese. Beverages Decaf coffee. Fruit and vegetable juices or smoothies (in small amounts, with no pulp or skins, and with fruits from allowed list). Sports drinks. Herbal tea. Fats and oils Olive oil, canola oil, sunflower oil, flaxseed oil, and grapeseed oil. Mayonnaise. Cream cheese. Margarine. Butter. Sweets and desserts Plain cakes and cookies. Cream pies and pies made with allowed fruits. Pudding. Custard. Fruit gelatin. Sherbet. Popsicles. Ice cream without nuts. Plain hard candy. Honey. Jelly. Molasses. Syrups, including chocolate syrup. Chocolate. Marshmallows. Gumdrops. Seasoning and other foods Bouillon. Broth. Cream soups made from allowed foods. Strained soup. Casseroles made with allowed foods. Ketchup. Mild mustard. Mild salad dressings. Plain gravies. Vinegar. Spices in moderation. Salt. Sugar. What foods are not allowed? The items listed below may not be a complete list. Talk with your dietitian about what dietary choices  are best for you. Grains Whole wheat and whole grain breads and crackers. Multigrain breads and crackers. Rye bread. Whole grain or multigrain cereals. Cereals with nuts, raisins, or coconut. Bran. Coarse wheat cereals. Granola. High-fiber cereals. Cornmeal or corn bread. Whole grain pasta. Wild or brown rice. Quinoa. Popcorn. Buckwheat. Wheat germ. Vegetables Potato skins. Raw or undercooked vegetables. All beans and bean sprouts. Cooked greens. Corn. Peas. Cabbage. Beets. Broccoli. Brussels sprouts. Cauliflower. Mushrooms. Onions. Peppers. Parsnips. Okra. Sauerkraut. Fruit Raw or dried fruit. Berries. Fruit juice with pulp. Prune juice. Meats and other protein foods Tough, fibrous meats with gristle. Fatty meat. Poultry with skin.  Fried meat, Sales executive, or fish. Deli or lunch meats. Sausage, bacon, and hot dogs. Nuts and chunky nut butter. Dried peas, beans, and lentils. Dairy Yogurt with fruit, nuts, chocolate, or granola mix-ins. Beverages Caffeinated coffee and teas. Fats and oils Avocado. Coconut. Sweets and desserts Desserts, cookies, or candies that contain nuts or coconut. Dried fruit. Jams and preserves with seeds. Marmalade. Any dessert made with fruits or grains that are not allowed. Seasoning and other foods Corn tortilla chips. Soups made with vegetables or grains that are not allowed. Relish. Horseradish. Angie Fava. Olives. Summary  Most people on a low-fiber eating plan should eat less than 10 grams of fiber a day. Follow recommendations from your dietitian about how much fiber you should have each day.  Always check food labels to see the dietary fiber content of packaged foods. In general, a low-fiber food will have fewer than 2 grams of fiber per serving.  In general, try to avoid whole grains, raw fruits and vegetables, dried fruit, tough cuts of meat, nuts, and seeds.  Take a vitamin and mineral supplement as told by your health care provider or dietitian. This information is not intended to replace advice given to you by your health care provider. Make sure you discuss any questions you have with your health care provider. Document Revised: 03/21/2019 Document Reviewed: 01/30/2017 Elsevier Patient Education  2020 [employer].

## 2020-06-29 ENCOUNTER — Telehealth: Payer: Self-pay | Admitting: *Deleted

## 2020-06-29 MED ORDER — METRONIDAZOLE 500 MG PO TABS
500.0000 mg | ORAL_TABLET | Freq: Three times a day (TID) | ORAL | Status: DC
  Administered 2020-06-29: 500 mg via ORAL
  Filled 2020-06-29: qty 1

## 2020-06-29 MED ORDER — CEFDINIR 300 MG PO CAPS: 300.0000 mg | ORAL_CAPSULE | Freq: Two times a day (BID) | ORAL | 0 refills | Status: AC

## 2020-06-29 MED ORDER — PANTOPRAZOLE SODIUM 40 MG PO TBEC: 40.0000 mg | DELAYED_RELEASE_TABLET | Freq: Every day | ORAL | 0 refills | Status: DC

## 2020-06-29 MED ORDER — CEFDINIR 300 MG PO CAPS
300.0000 mg | ORAL_CAPSULE | Freq: Two times a day (BID) | ORAL | Status: DC
  Administered 2020-06-29: 300 mg via ORAL
  Filled 2020-06-29: qty 1

## 2020-06-29 MED ORDER — METRONIDAZOLE 500 MG PO TABS: 500.0000 mg | ORAL_TABLET | Freq: Three times a day (TID) | ORAL | 0 refills | Status: AC

## 2020-06-29 NOTE — Discharge Summary (Signed)
Physician Discharge Summary  Eileen Wilkerson QDI:[id number] DOB: 1932/02/23 DOA: 06/23/2020  PCP: Eileen Dad, MD  Admit date: 06/23/2020 Discharge date: 06/29/2020  Admitted From: Home Disposition:  Home Health  Recommendations for Outpatient Follow-up:  1. Follow up with PCP in 1-2 weeks 2. Please obtain BMP/CBC in one week 3. Please follow up on the following pending results:  Home Health:HH PT  Equipment/Devices: None  Discharge Condition: Stable Code Status: FULL Diet recommendation:  Diet Order            DIET SOFT Room service appropriate? Yes; Fluid consistency: Thin  Diet effective now                 Brief/Interim Summary: 86 y.o.femalewith medical history significant ofRA, hypothyroid, asthma who presents with abd pain over the past 2-3 days. Patient reports initially having decreased appetite on 7/12 with no other notable symptoms. On 7/13, patient experienced marked and persistent abd pain across the abdomen. Symptoms persisted overnight and patient subsequently presented to the ED for further work up. Denies chest pain or sob. Did report a temp as high as 100.1 prior to visit. Pt also reported several bouts of diarrhea associated with abd discomfort.  ED Course:In the ED, pt was found to be afebrile. Presenting HR was up to 109 bpm and RR as high as 22. WBC noted to be over 17k with CT abd/pelvis demonstrating acute sigmoid diverticulitis with pneumoperitoneum, reviewed.  General surgery was consulted and patient was admitted for further management. Patient was admitted managed conservatively antibiotics switched to [place].  She clinically improved leukocytosis resolved.  Able to tolerate diet slowly and was advanced up to soft diet. At this time no more abdominal pain nausea vomiting fever chills.She is tolerating diet.  Surgery signed off and advised discharge home and they will follow-up as outpatient.  Discharge Diagnoses:  Sigmoid colon diverticulitis  with perforation and sepsis POA:  Clinically improved.  At this time tolerating soft diet-no abdominal pain nausea vomiting.  Having bowel movement.  Leukocytosis resolved.  Afebrile.  Seen by surgery and discussed and has signed off and okay to discharge home on 14 days course of Omnicef/Flagyl, she has tolerated Omnicef in the past she has penicillin and Keflex allergy. She will Follow-up with Eileen Wilkerson in the office. Colonoscopy IS ADVISED in 4 to 6 weeks following diverticulitis-but will be deferred to surgery on follow-up.  Hypokalemia repleted and resolved. She is on po kcl.  Rheumatoid arthritis on chronic prednisone 10 mg.  We will continue the same. Patient also reports that her prednisone is also for  maintenance dose for her adrenal insufficiency. Cont PPI cont her tramadol  DJD, cervical: Pain is controlled.Continue home tramadol.  Pure hypercholesterolemia: Continue her statin  Consults:  General surgery  Subjective: Resting comfortably no nausea vomiting abdominal pain.  Reports her daughter is out of town till afternoon and she will able to go home today.  Discharge Exam: Vitals:   06/28/20 2117 06/29/20 0620  BP: (!) 149/82 (!) 164/104  Pulse: 88 97  Resp: 16 16  Temp: 98.2 F (36.8 C) 98.1 F (36.7 C)  SpO2: 95% 96%   General: Pt is alert, awake, not in acute distress Cardiovascular: RRR, S1/S2 +, no rubs, no gallops Respiratory: CTA bilaterally, no wheezing, no rhonchi Abdominal: Soft, NT, ND, bowel sounds + Extremities: no edema, no cyanosis  Discharge Instructions  Discharge Instructions    Discharge instructions   Complete by: As directed    Please  continue with soft low fiber diet until further instruction from your surgeon Dr Kieth Wilkerson and follow up with him in office.  Please discuss with surgeon/gastroenterology regarding whether  you would benefit with colonoscopy or not after diverticulitis.  Please call call MD or return to ER for similar  or worsening recurring problem that brought you to hospital or if any fever,nausea/vomiting,abdominal pain, uncontrolled pain, chest pain,  shortness of breath or any other alarming symptoms.  Please follow-up your doctor as instructed in a week time and call the office for appointment.  Please avoid alcohol, smoking, or any other illicit substance and maintain healthy habits including taking your regular medications as prescribed.  You were cared for by a hospitalist during your hospital stay. If you have any questions about your discharge medications or the care you received while you were in the hospital after you are discharged, you can call the unit and ask to speak with the hospitalist on call if the hospitalist that took care of you is not available.  Once you are discharged, your primary care physician will handle any further medical issues. Please note that NO REFILLS for any discharge medications will be authorized once you are discharged, as it is imperative that you return to your primary care physician (or establish a relationship with a primary care physician if you do not have one) for your aftercare needs so that they can reassess your need for medications and monitor your lab values   Increase activity slowly   Complete by: As directed      Allergies as of 06/29/2020      Reactions   Adhesive [tape] Rash   Celebrex [celecoxib] Hives   Ciprofibrate Nausea Only   Cymbalta [duloxetine Hcl] Swelling   Gabitril [tiagabine] Swelling   Keflex [cephalexin] Nausea And Vomiting   Lyrica [pregabalin] Swelling   Neurontin [gabapentin] Swelling   Nexium [esomeprazole] Rash   Nsaids Rash   Penicillins Rash   Injection site reaction. Tolerated cefepime in past Has patient had a PCN reaction causing immediate rash, facial/tongue/throat swelling, SOB or lightheadedness with hypotension: No Has patient had a PCN reaction causing severe rash involving mucus membranes or skin necrosis:  No Has patient had a PCN reaction that required hospitalization No Has patient had a PCN reaction occurring within the last 10 years: No If all of the above answers are "NO", then may proceed with Cephalosporin use.   Shrimp [shellfish Allergy] Anaphylaxis   Per patient "shrimp only"   Sulfa Antibiotics Nausea And Vomiting   Azactam [aztreonam]    Hand swelling    Azelastine Hcl    Rash    Ciprofloxacin Other (See Comments)   dizziness   Claritin [loratadine]    Irritability Nervousness    Methotrexate Derivatives    Nasacort [triamcinolone]    Dizzy    Olopatadine Other (See Comments)   Pain and lethargy    Sulfamethizole Other (See Comments)   unknown   Zantac [ranitidine Hcl] Other (See Comments)   unknown   Claritin-d 12 Hour [loratadine-pseudoephedrine Er] Anxiety      Medication List    TAKE these medications   acetaminophen 500 MG tablet Commonly known as: TYLENOL Take 500 mg by mouth every 6 (six) hours.   atorvastatin 20 MG tablet Commonly known as: LIPITOR Take 1 tablet (20 mg total) by mouth daily.   Biotin Maximum Strength 10 MG Tabs Generic drug: Biotin Take 10 mg by mouth daily.   cefdinir 300 MG capsule Commonly  known as: OMNICEF Take 1 capsule (300 mg total) by mouth every 12 (twelve) hours for 8 days.   CRANBERRY PO Take 1 capsule by mouth daily.   cyanocobalamin 1000 MCG/ML injection Commonly known as: (VITAMIN B-12) 1000 mcg injection once per month. What changed:   how much to take  how to take this  when to take this   cycloSPORINE 0.05 % ophthalmic emulsion Commonly known as: RESTASIS Place 1 drop into both eyes 2 (two) times daily.   diclofenac Sodium 1 % Gel Commonly known as: VOLTAREN APPLY 2 TO 4 GRAMS TOPICALLY TO AFFECTED JOINTS UP TO 4 TIMES DAILY What changed: See the new instructions.   EPINEPHrine 0.3 mg/0.3 mL Soaj injection Commonly known as: EPI-PEN Inject 0.3 mg into the muscle as needed for anaphylaxis.    fexofenadine 180 MG tablet Commonly known as: ALLEGRA Take 180 mg by mouth daily.   fluticasone 50 MCG/ACT nasal spray Commonly known as: FLONASE Place 2 sprays into both nostrils daily.   furosemide 80 MG tablet Commonly known as: LASIX Take 1 tablet (80 mg total) by mouth daily.   ketoconazole 2 % shampoo Commonly known as: NIZORAL Apply 1 application topically 2 (two) times a week.   lansoprazole 30 MG capsule Commonly known as: PREVACID Take one capsule by mouth once daily at 12 noon. What changed:   how much to take  how to take this  when to take this  additional instructions   leflunomide 10 MG tablet Commonly known as: ARAVA TAKE 1 TABLET BY MOUTH EVERY DAY   MELATONIN PO Take 10 mg by mouth daily.   metroNIDAZOLE 500 MG tablet Commonly known as: FLAGYL Take 1 tablet (500 mg total) by mouth every 8 (eight) hours for 8 days.   montelukast 10 MG tablet Commonly known as: SINGULAIR Take 1 tablet (10 mg total) by mouth at bedtime.   potassium chloride 10 MEQ tablet Commonly known as: KLOR-CON Take 2 tablets (20 mEq total) by mouth 2 (two) times daily. What changed:   how much to take  when to take this   predniSONE 10 MG tablet Commonly known as: DELTASONE TAKE 1 TABLET (10 MG TOTAL) BY MOUTH DAILY WITH BREAKFAST.   SYRINGE 3CC/25GX1" 25G X 1" 3 ML Misc 1 application by Does not apply route every 30 (thirty) days.   traMADol 50 MG tablet Commonly known as: ULTRAM TAKE 1 TABLET BY MOUTH 4 TIMES A DAY What changed:   how much to take  how to take this  when to take this  additional instructions   Vitamin D3 125 MCG (5000 UT) Caps Take 5,000 Units by mouth daily.       Follow-up Information    Kinsinger, Arta Bruce, MD Follow up on 07/28/2020.   Specialty: General Surgery Why:  2:30pm, arrive by 2:00pm for paperwork and check in process Contact information: [place] [address] [phone]         Eileen Dad, MD Follow up in 1 week(s).   Specialty: Internal Medicine Contact information: [place] [phone]              Allergies  Allergen Reactions  . Adhesive [Tape] Rash  . Celebrex [Celecoxib] Hives  . Ciprofibrate Nausea Only  . Cymbalta [Duloxetine Hcl] Swelling  . Gabitril [Tiagabine] Swelling  . Keflex [Cephalexin] Nausea And Vomiting  . Lyrica [Pregabalin] Swelling  . Neurontin [Gabapentin] Swelling  . Nexium [Esomeprazole]  Rash  . Nsaids Rash  . Penicillins Rash    Injection site reaction. Tolerated cefepime in past Has patient had a PCN reaction causing immediate rash, facial/tongue/throat swelling, SOB or lightheadedness with hypotension: No Has patient had a PCN reaction causing severe rash involving mucus membranes or skin necrosis: No Has patient had a PCN reaction that required hospitalization No Has patient had a PCN reaction occurring within the last 10 years: No If all of the above answers are "NO", then may proceed with Cephalosporin use.   . Shrimp [Shellfish Allergy] Anaphylaxis    Per patient "shrimp only"  . Sulfa Antibiotics Nausea And Vomiting  . Azactam [Aztreonam]     Hand swelling   . Azelastine Hcl     Rash   . Ciprofloxacin Other (See Comments)    dizziness  . Claritin [Loratadine]     Irritability Nervousness   . Methotrexate Derivatives   . Nasacort [Triamcinolone]     Dizzy   . Olopatadine Other (See Comments)    Pain and lethargy   . Sulfamethizole Other (See Comments)    unknown  . Zantac [Ranitidine Hcl] Other (See Comments)    unknown  . Claritin-D 12 Hour [Loratadine-Pseudoephedrine Er] Anxiety    The results of significant diagnostics from this hospitalization (including imaging, microbiology, ancillary and laboratory) are listed below for reference.    Microbiology: Recent Results (from the past 240 hour(s))  SARS Coronavirus 2 by RT PCR (hospital order, performed in [facility] hospital lab) Nasopharyngeal Nasopharyngeal Swab     Status: None   Collection Time: 06/23/20  1:39 PM   Specimen: Nasopharyngeal Swab  Result Value Ref Range Status   SARS Coronavirus 2 NEGATIVE NEGATIVE Final    Comment: (NOTE) SARS-CoV-2 target nucleic acids are NOT DETECTED.  The SARS-CoV-2 RNA is generally detectable in upper and lower respiratory specimens during the acute phase of infection. The lowest concentration of SARS-CoV-2 viral copies this assay can detect is 250 copies / mL. A negative result does not preclude SARS-CoV-2 infection and should not be used as the sole basis for treatment or other patient management decisions.  A negative result may occur with improper specimen collection / handling, submission of specimen other than nasopharyngeal swab, presence of viral mutation(s) within the areas targeted by this assay, and inadequate number of viral copies (<250 copies / mL). A negative result must be combined with clinical observations, patient history, and epidemiological information.  Fact Sheet for Patients:   StrictlyIdeas.no  Fact Sheet for Healthcare Providers: BankingDealers.co.za  This test is not yet approved or  cleared by the [place] FDA and has been authorized for detection and/or diagnosis of SARS-CoV-2 by FDA under an Emergency Use Authorization (EUA).  This EUA will remain in effect (meaning this test can be used) for the duration of the COVID-19 declaration under Section 564(b)(1) of the Act, 21 U.S.C. section 360bbb-3(b)(1), unless the authorization is terminated or revoked sooner.  Performed at [facility], [place] [address] [place], [place] [address]   Culture, blood (routine x 2)     Status: None   Collection Time: 06/23/20  2:17 PM   Specimen: BLOOD  Result Value Ref Range Status   Specimen Description   Final    BLOOD RIGHT ANTECUBITAL Performed at [employer] [address] [place], [place] [address]    Special Requests   Final    BOTTLES DRAWN AEROBIC AND ANAEROBIC Blood Culture results may not be  optimal due to an inadequate volume of blood received in culture bottles Performed at [place] [address] [place], [place] [address]    Culture   Final    NO GROWTH 5 DAYS Performed at [facility], [place] [address] [place], [place] [address]    Report Status 06/28/2020 FINAL  Final  Culture, blood (routine x 2)     Status: None   Collection Time: 06/23/20  2:17 PM   Specimen: BLOOD  Result Value Ref Range Status   Specimen Description   Final    BLOOD LEFT ANTECUBITAL Performed at [place] [address] [place], [place] [address]    Special Requests   Final    BOTTLES DRAWN AEROBIC AND ANAEROBIC Blood Culture adequate volume Performed at [place] [address] [place], [place] [address]    Culture   Final    NO GROWTH 5 DAYS Performed at [facility], [place] [address] [place], [place] [address]    Report Status 06/28/2020 FINAL  Final  Urine culture     Status: None   Collection Time: 06/24/20  4:00 AM   Specimen: Urine, Clean Catch  Result Value Ref Range Status   Specimen Description   Final    URINE, CLEAN CATCH Performed at [facility], [place] [address] [place], [place] [address]    Special Requests   Final    NONE Performed at [facility], [place] [address], [place], [place] [address]    Culture   Final    NO GROWTH Performed at [facility], [place] [address] [place], [place] [address]    Report Status 06/25/2020 FINAL  Final    Procedures/Studies: CT ABDOMEN PELVIS W CONTRAST  Result Date: 06/23/2020 CLINICAL DATA:  Weakness, fever, dehydration EXAM: CT ABDOMEN AND PELVIS WITH CONTRAST TECHNIQUE: Multidetector CT imaging of the abdomen and pelvis was performed using  the standard protocol following bolus administration of intravenous contrast. CONTRAST:  171m OMNIPAQUE IOHEXOL 300 MG/ML  SOLN COMPARISON:  02/03/2018 FINDINGS: Lower chest: Stable subpleural scarring, with scattered tree in bud opacities at the bases compatible with chronic indolent infection such as MAC. Otherwise no acute pleural or parenchymal lung disease. Hepatobiliary: No focal liver abnormality is seen. No gallstones, gallbladder wall thickening, or biliary dilatation. Pancreas: Unremarkable. No pancreatic ductal dilatation or surrounding inflammatory changes. Spleen: Normal in size without focal abnormality. Adrenals/Urinary Tract: Adrenal glands are unremarkable. Kidneys are normal, without renal calculi, focal lesion, or hydronephrosis. Bladder is unremarkable. Stomach/Bowel: No bowel obstruction or ileus. There is wall thickening of the sigmoid colon, with surrounding pericolonic fat stranding. There is free gas within the adjacent mesentery, consistent with perforated sigmoid diverticulitis. There is no fluid collection or abscess. Normal appendix is seen in the right mid abdomen. Vascular/Lymphatic: Aortic atherosclerosis. No enlarged abdominal or pelvic lymph nodes. Reproductive: Uterus and right adnexa are unremarkable. There is a 3.4 cm simple appearing cyst within the left ovary, previous having measured 2.9 cm. Other: No free fluid. Free gas in the left lower quadrant adjacent to the sigmoid diverticulitis. No abdominal wall hernia. Musculoskeletal: No acute or destructive bony lesions. Severe osteoarthritis within the right hip. Postsurgical changes lower lumbar spine. Reconstructed images demonstrate no additional findings. IMPRESSION: 1. Acute perforated sigmoid diverticulitis, with pneumoperitoneum in the left lower quadrant. No fluid collection or abscess. 2. A 3.4 cm simple appearing left ovarian cyst, previous having measured 2.9 cm.  Pelvic ultrasound recommended if not previously  performed. This recommendation follows ACR consensus guidelines: White Paper of the ACR Incidental Findings Committee II on Adnexal Findings. J Am Coll Radiol 2013:10:675-681. 3. Aortic Atherosclerosis (ICD10-I70.0). These results were called by telephone at the time of interpretation on 06/23/2020 at 3:26 pm to provider Emeterio Reeve , who verbally acknowledged these results. Electronically Signed   By: Randa Ngo M.D.   On: 06/23/2020 15:33   DG Chest Portable 1 View  Result Date: 06/23/2020 CLINICAL DATA:  Nausea and weakness. EXAM: PORTABLE CHEST 1 VIEW COMPARISON:  06/09/1999 FINDINGS: Hypoventilation. Decreased lung volume with bibasilar atelectasis left greater than right. Negative for heart failure. No pleural effusion. ACDF cervical spine. IMPRESSION: Hypoventilation with bibasilar atelectasis. Electronically Signed   By: Franchot Gallo M.D.   On: 06/23/2020 13:56    Labs: BNP (last 3 results) No results for input(s): BNP in the last 8760 hours. Basic Metabolic Panel: Recent Labs  Lab 06/25/20 0941 06/26/20 0518 06/27/20 0557 06/28/20 0542 06/28/20 1813  NA 144 143 146* 146* 143  K 3.1* 4.1 3.2* 2.9* 4.4  CL 111 113* 111 106 105  CO2 22 20* _0 GLUCOSE 78 89 87 90 124*  BUN 10 8 6* 7* 9  CREATININE 0.66 0.68 0.66 0.57 0.63  CALCIUM 8.2* 8.7* 9.3 9.5 9.4   Liver Function Tests: Recent Labs  Lab 06/23/20 1323 06/25/20 0941  AST 18 20  ALT 15 14  ALKPHOS 49 43  BILITOT 1.2 1.1  PROT 7.3 6.3*  ALBUMIN 3.3* 2.7*   Recent Labs  Lab 06/23/20 1323  LIPASE 21   No results for input(s): AMMONIA in the last 168 hours. CBC: Recent Labs  Lab 06/23/20 1323 06/23/20 1323 06/24/20 0501 06/25/20 0849 06/26/20 0518 06/27/20 0557 06/28/20 0542  WBC 17.3*   < > 12.9* 10.9* 9.5 8.1 9.4  NEUTROABS 12.8*  --   --  7.8*  --   --   --   HGB 13.6   < > 14.3 11.5* 11.8* 12.3 12.2  HCT 42.4   < > 46.0 37.1 38.2 38.5 38.5  MCV 100.2*   < > 100.9* 102.2* 101.6* 99.5  98.7  PLT 272   < > 208 256 262 272 278   < > = values in this interval not displayed.   Cardiac Enzymes: No results for input(s): CKTOTAL, CKMB, CKMBINDEX, TROPONINI in the last 168 hours. BNP: Invalid input(s): POCBNP CBG: No results for input(s): GLUCAP in the last 168 hours. D-Dimer No results for input(s): DDIMER in the last 72 hours. Hgb A1c No results for input(s): HGBA1C in the last 72 hours. Lipid Profile No results for input(s): CHOL, HDL, LDLCALC, TRIG, CHOLHDL, LDLDIRECT in the last 72 hours. Thyroid function studies No results for input(s): TSH, T4TOTAL, T3FREE, THYROIDAB in the last 72 hours.  Invalid input(s): FREET3 Anemia work up No results for input(s): VITAMINB12, FOLATE, FERRITIN, TIBC, IRON, RETICCTPCT in the last 72 hours. Urinalysis    Component Value Date/Time   COLORURINE YELLOW 06/24/2020 0400   APPEARANCEUR CLEAR 06/24/2020 0400   LABSPEC >1.046 (H) 06/24/2020 0400   PHURINE 6.0 06/24/2020 0400   GLUCOSEU NEGATIVE 06/24/2020 0400   GLUCOSEU NEGATIVE 02/13/2018 1036   HGBUR MODERATE (A) 06/24/2020 0400   BILIRUBINUR NEGATIVE 06/24/2020 0400   BILIRUBINUR Negative 01/24/2019 1444   KETONESUR 80 (A) 06/24/2020 0400   PROTEINUR 30 (A) 06/24/2020 0400   UROBILINOGEN 0.2 01/24/2019 1444   UROBILINOGEN 0.2 02/13/2018  [place] 06/24/2020 0400   LEUKOCYTESUR SMALL (A) 06/24/2020 0400   Sepsis Labs Invalid input(s): PROCALCITONIN,  WBC,  LACTICIDVEN Microbiology Recent Results (from the past 240 hour(s))  SARS Coronavirus 2 by RT PCR (hospital order, performed in [facility] hospital lab) Nasopharyngeal Nasopharyngeal Swab     Status: None   Collection Time: 06/23/20  1:39 PM   Specimen: Nasopharyngeal Swab  Result Value Ref Range Status   SARS Coronavirus 2 NEGATIVE NEGATIVE Final    Comment: (NOTE) SARS-CoV-2 target nucleic acids are NOT DETECTED.  The SARS-CoV-2 RNA is generally detectable in upper and lower respiratory  specimens during the acute phase of infection. The lowest concentration of SARS-CoV-2 viral copies this assay can detect is 250 copies / mL. A negative result does not preclude SARS-CoV-2 infection and should not be used as the sole basis for treatment or other patient management decisions.  A negative result may occur with improper specimen collection / handling, submission of specimen other than nasopharyngeal swab, presence of viral mutation(s) within the areas targeted by this assay, and inadequate number of viral copies (<250 copies / mL). A negative result must be combined with clinical observations, patient history, and epidemiological information.  Fact Sheet for Patients:   StrictlyIdeas.no  Fact Sheet for Healthcare Providers: BankingDealers.co.za  This test is not yet approved or  cleared by the [place] FDA and has been authorized for detection and/or diagnosis of SARS-CoV-2 by FDA under an Emergency Use Authorization (EUA).  This EUA will remain in effect (meaning this test can be used) for the duration of the COVID-19 declaration under Section 564(b)(1) of the Act, 21 U.S.C. section 360bbb-3(b)(1), unless the authorization is terminated or revoked sooner.  Performed at [facility], [place] [address], [place], [place] [address]   Culture, blood (routine x 2)     Status: None   Collection Time: 06/23/20  2:17 PM   Specimen: BLOOD  Result Value Ref Range Status   Specimen Description   Final    BLOOD RIGHT ANTECUBITAL Performed at [place] [address], [place], [place] [address]    Special Requests   Final    BOTTLES DRAWN AEROBIC AND ANAEROBIC Blood Culture results may not be optimal due to an inadequate volume of blood received in culture bottles Performed at [place] [address] [place], [place] [address]    Culture   Final    NO GROWTH 5  DAYS Performed at [facility], [place] [address] [place], [place] [address]    Report Status 06/28/2020 FINAL  Final  Culture, blood (routine x 2)     Status: None   Collection Time: 06/23/20  2:17 PM   Specimen: BLOOD  Result Value Ref Range Status   Specimen Description   Final    BLOOD LEFT ANTECUBITAL Performed at [place] [address], [place], [place] [address]    Special Requests   Final    BOTTLES DRAWN AEROBIC AND ANAEROBIC Blood Culture adequate volume Performed at [place] [address], [place], [place] [address]    Culture   Final    NO GROWTH 5 DAYS Performed at [facility], [place] [address], [place],  [address]    Report Status 06/28/2020 FINAL  Final  Urine culture     Status: None   Collection Time: 06/24/20  4:00 AM   Specimen: Urine, Clean Catch  Result Value Ref Range Status   Specimen Description   Final    URINE, CLEAN CATCH Performed at [facility], [place] [address] [place], [place] [address]    Special Requests   Final    NONE Performed at [facility], [place] [address] [place], [place] [address]    Culture   Final    NO GROWTH Performed at [facility], [place] [address] [place], [place] [address]    Report Status 06/25/2020 FINAL  Final     Time coordinating discharge: 35  minutes  SIGNED: Antonieta Pert, MD  [facility] 06/29/2020, 10:17 AM  If 7PM-7AM, please contact night-coverage [email or web address]

## 2020-06-29 NOTE — Progress Notes (Signed)
Discharge instructions given to pt and all questions were answered.  

## 2020-06-29 NOTE — Telephone Encounter (Signed)
Transition Care Management Follow-Up Telephone Call   Date discharged and where: 06/29/2020 [place]  How have you been since you were released from the hospital? "Better than when I went in"  Any patient concerns? No  Items Reviewed:   Meds: Yes  Allergies:Yes  Dietary Changes Reviewed:Yes  Functional Questionnaire:  Independent-I Dependent-D  ADLs:I with assistance   Dressing- I    Eating-I   Maintaining continence-I   Transferring-I with assistance   Transportation-D   Meal Prep-D   Managing Meds- I  Confirmed importance and Date/Time of follow-up visits scheduled:07/08/2020 with ManXie Mast at [facility]   Confirmed with patient if condition worsens to call PCP or go to the Emergency Dept. Patient was given office number and encouraged to call back with questions or concerns: Yes

## 2020-06-29 NOTE — Progress Notes (Signed)
Nutrition Education Note  RD consulted for nutrition education regarding nutrition management for diverticulosis/ Diverticulitis.    RD provided "Low Fiber Nutrition Therapy" handout as well as "High Fiber Nutrition Therapy" handout from the Academy of Nutrition and Dietetics. Explained reasons for pt to follow a low fiber diet over the next 4-6 weeks. Reviewed low fiber foods and high fiber foods. Discussed best practice for long term management of diverticulosis is a high fiber diet and discussed ways to gradually increase fiber in the diet.   Teach back method used. Pt verbalizes understanding of information provided.   Expect fair compliance.  Body mass index is Body mass index is 29.52 kg/m.Marland Kitchen Pt meets criteria for overweight based on current BMI.  Current diet order is soft, patient is consuming approximately 66% of meals at this time. Labs and medications reviewed. No further nutrition interventions warranted at this time (pt discharging today). RD contact information provided. If additional nutrition issues arise, please re-consult RD.  Larkin Ina, MS, RD, LDN RD pager number and weekend/on-call pager number located in [place].

## 2020-06-29 NOTE — Progress Notes (Addendum)
Subjective: Late entry from yesterday.  Patient feeling better.  Eating soft diet.  Moving her bowels.  Would like a dietitian consult to learn about low fiber diet at home  ROS: See above, otherwise other systems negative  Objective: Vital signs in last 24 hours: Temp:  [97.7 F (36.5 C)-98.2 F (36.8 C)] 98.1 F (36.7 C) (07/20 0620) Pulse Rate:  [88-97] 97 (07/20 0620) Resp:  [16-18] 16 (07/20 0620) BP: (133-164)/(82-104) 164/104 (07/20 0620) SpO2:  [95 %-98 %] 96 % (07/20 0620) Last BM Date: 06/28/20  Intake/Output from previous day: 07/19 0701 - 07/20 0700 In: 1643.5 [P.O.:720; I.V.:823.5; IV Piggyback:100] Out: 0  Intake/Output this shift: No intake/output data recorded.  PE: Abd: soft, NT, ND, +BS  Lab Results:  Recent Labs    06/27/20 0557 06/28/20 0542  WBC 8.1 9.4  HGB 12.3 12.2  HCT 38.5 38.5  PLT 272 278   BMET Recent Labs    06/28/20 0542 06/28/20 1813  NA 146* 143  K 2.9* 4.4  CL 106 105  CO2 22 27  GLUCOSE 90 124*  BUN 7* 9  CREATININE 0.57 0.63  CALCIUM 9.5 9.4   PT/INR No results for input(s): LABPROT, INR in the last 72 hours. CMP     Component Value Date/Time   NA 143 06/28/2020 1813   NA 145 (H) 08/08/2018 1535   K 4.4 06/28/2020 1813   CL 105 06/28/2020 1813   CO2 27 06/28/2020 1813   GLUCOSE 124 (H) 06/28/2020 1813   BUN 9 06/28/2020 1813   BUN 25 08/08/2018 1535   CREATININE 0.63 06/28/2020 1813   CREATININE 1.19 (H) 05/13/2020 1123   CALCIUM 9.4 06/28/2020 1813   PROT 6.3 (L) 06/25/2020 0941   PROT 7.8 08/08/2018 1535   ALBUMIN 2.7 (L) 06/25/2020 0941   ALBUMIN 4.6 08/08/2018 1535   AST 20 06/25/2020 0941   ALT 14 06/25/2020 0941   ALKPHOS 43 06/25/2020 0941   BILITOT 1.1 06/25/2020 0941   BILITOT 0.6 08/08/2018 1535   GFRNONAA >60 06/28/2020 1813   GFRNONAA 42 (L) 05/13/2020 1123   GFRAA >60 06/28/2020 1813   GFRAA 48 (L) 05/13/2020 1123   Lipase     Component Value Date/Time   LIPASE 21 06/23/2020  1323       Studies/Results: No results found.  Anti-infectives: Anti-infectives (From admission, onward)   Start     Dose/Rate Route Frequency Ordered Stop   06/25/20 1000  ertapenem (INVANZ) 1,000 mg in sodium chloride 0.9 % 100 mL IVPB     Discontinue     1 g 200 mL/hr over 30 Minutes Intravenous Every 24 hours 06/25/20 0933     06/24/20 1000  cefTRIAXone (ROCEPHIN) 2 g in sodium chloride 0.9 % 100 mL IVPB  Status:  Discontinued        2 g 200 mL/hr over 30 Minutes Intravenous Every 24 hours 06/23/20 1703 06/25/20 0933   06/24/20 0000  metroNIDAZOLE (FLAGYL) IVPB 500 mg  Status:  Discontinued        500 mg 100 mL/hr over 60 Minutes Intravenous Every 8 hours 06/23/20 1703 06/25/20 0933   06/23/20 1445  cefTRIAXone (ROCEPHIN) 2 g in sodium chloride 0.9 % 100 mL IVPB        2 g 200 mL/hr over 30 Minutes Intravenous  Once 06/23/20 1432 06/23/20 1629   06/23/20 1430  levofloxacin (LEVAQUIN) IVPB 750 mg  Status:  Discontinued  750 mg 100 mL/hr over 90 Minutes Intravenous  Once 06/23/20 1423 06/23/20 1432   06/23/20 1430  metroNIDAZOLE (FLAGYL) IVPB 500 mg        500 mg 100 mL/hr over 60 Minutes Intravenous  Once 06/23/20 1423 06/23/20 1732       Assessment/Plan Adrenal failure on chronic steroids Asthma Fibromyalgia RA CHF 3.4 cm L ovarian cyst - seen incidentally on CT   Sigmoid diverticulitis with perforation - tolerating soft diet -ok to switch to oral abx therapy.  Discussed with Dr. Lupita Leash regarding home abx given allergies. -consult to dietitian for diet education -surgically stable for DC home -follow up arranged with Dr. [name] in our office  FEN - soft VTE - lovenox ID - invanz   LOS: 6 days    Henreitta Cea , [facility] 06/29/2020, 7:05 AM Please see Amion for pager number during day hours 7:00am-4:30pm or 7:00am -11:30am on weekends  Agree with above. Doing well.  Alphonsa Overall, MD, [facility] Surgery Office  phone:  [phone]

## 2020-07-01 ENCOUNTER — Encounter: Payer: Self-pay | Admitting: Internal Medicine

## 2020-07-01 DIAGNOSIS — M797 Fibromyalgia: Secondary | ICD-10-CM

## 2020-07-01 DIAGNOSIS — M47816 Spondylosis without myelopathy or radiculopathy, lumbar region: Secondary | ICD-10-CM

## 2020-07-01 DIAGNOSIS — K219 Gastro-esophageal reflux disease without esophagitis: Secondary | ICD-10-CM

## 2020-07-01 DIAGNOSIS — M0579 Rheumatoid arthritis with rheumatoid factor of multiple sites without organ or systems involvement: Secondary | ICD-10-CM

## 2020-07-01 DIAGNOSIS — R600 Localized edema: Secondary | ICD-10-CM

## 2020-07-01 MED ORDER — FUROSEMIDE 80 MG PO TABS: 80.0000 mg | ORAL_TABLET | Freq: Every day | ORAL | 1 refills | Status: DC

## 2020-07-01 MED ORDER — LANSOPRAZOLE 30 MG PO CPDR: DELAYED_RELEASE_CAPSULE | ORAL | 1 refills | Status: DC

## 2020-07-01 MED ORDER — TRAMADOL HCL 50 MG PO TABS: ORAL_TABLET | ORAL | 0 refills | Status: DC

## 2020-07-01 NOTE — Telephone Encounter (Signed)
Patient requested refills Pended and sent to [facility] for approval.

## 2020-07-08 ENCOUNTER — Encounter: Payer: Self-pay | Admitting: Nurse Practitioner

## 2020-07-08 ENCOUNTER — Other Ambulatory Visit: Payer: Self-pay

## 2020-07-08 ENCOUNTER — Ambulatory Visit: Payer: Medicare PPO | Admitting: Nurse Practitioner

## 2020-07-08 DIAGNOSIS — K5720 Diverticulitis of large intestine with perforation and abscess without bleeding: Secondary | ICD-10-CM | POA: Diagnosis not present

## 2020-07-08 DIAGNOSIS — R531 Weakness: Secondary | ICD-10-CM | POA: Diagnosis not present

## 2020-07-08 DIAGNOSIS — K219 Gastro-esophageal reflux disease without esophagitis: Secondary | ICD-10-CM

## 2020-07-08 DIAGNOSIS — E538 Deficiency of other specified B group vitamins: Secondary | ICD-10-CM | POA: Diagnosis not present

## 2020-07-08 DIAGNOSIS — R600 Localized edema: Secondary | ICD-10-CM | POA: Diagnosis not present

## 2020-07-08 DIAGNOSIS — M0579 Rheumatoid arthritis with rheumatoid factor of multiple sites without organ or systems involvement: Secondary | ICD-10-CM

## 2020-07-08 DIAGNOSIS — J45909 Unspecified asthma, uncomplicated: Secondary | ICD-10-CM | POA: Insufficient documentation

## 2020-07-08 NOTE — Progress Notes (Signed)
Location:   clinic [place]   Place of Service:   clinic [place] Provider: Marlana Latus NP  Code Status: DNR Goals of Care: [place] 06/23/2020  Does Patient Have a Medical Advance Directive? No  Type of Advance Directive -  Does patient want to make changes to medical advance directive? -  Copy of [place] in Chart? -  Would patient like information on creating a medical advance directive? No - Patient declined     Chief Complaint  Patient presents with  . Transitions Of Care    Patient was in [place] 06/23/2020 - 06/29/2020 (6 days)  . Health Maintenance    TDAP    HPI: Patient is a 86 y.o. female seen today for medical management of chronic diseases.    Hospitalized 06/23/20-06/29/20 for abd pain, CT abd/pelvis acute sigmoid diverticulitis with pneumoperitoneum, general surgery recommended conservative tx with ABT Omnicef/Falgyl x 14 days, f/u Dr. [name], colonoscopy advised 4-6 wks.  Asthma Epinephrine prn, Allegra, Flonase, Singulair  RA takes Prednisone, Tramadol  GERD Prevacid 16m qd  Edema , takes Furosemide 846mqd, Kcl  Past Medical History:  Diagnosis Date  . Adrenal failure (HCC[place]  . Arthritis   . Asthma   . Cancer (HCL[place]  . Cataract   . Closed nondisplaced fracture of fifth right metatarsal bone 09/18/2017  . Fibromyalgia 2008  . HCAP (healthcare-associated pneumonia) 02/03/2018  . Osteoporosis   . RA (rheumatoid arthritis) ([place]  . Recurrent upper respiratory infection (URI)   . Sepsis due to urinary tract infection (HCFreeland2/07/2018  . Urticaria     Past Surgical History:  Procedure Laterality Date  . BACK SURGERY    . BILATERAL CARPAL TUNNEL RELEASE  2005   right and left  . CATARACT EXTRACTION, BILATERAL  2004   right and left  . CERVICAL FUSION  2011,2010,2008   2 disks  . HEEL SPUR SURGERY  2004  . lower back fusion  2011   Fusion of 3-4 and 4-5 lower back  . RAB[place]. ROTATOR CUFF REPAIR   [id number]. SQUAMOUS CELL CARCINOMA EXCISION    . TONSILLECTOMY AND ADENOIDECTOMY  1947  . TOTAL SHOULDER ARTHROPLASTY      Allergies  Allergen Reactions  . Adhesive [Tape] Rash  . Celebrex [Celecoxib] Hives  . Ciprofibrate Nausea Only  . Cymbalta [Duloxetine Hcl] Swelling  . Gabitril [Tiagabine] Swelling  . Keflex [Cephalexin] Nausea And Vomiting  . Lyrica [Pregabalin] Swelling  . Neurontin [Gabapentin] Swelling  . Nexium [Esomeprazole] Rash  . Nsaids Rash  . Penicillins Rash    Injection site reaction. Tolerated cefepime in past Has patient had a PCN reaction causing immediate rash, facial/tongue/throat swelling, SOB or lightheadedness with hypotension: No Has patient had a PCN reaction causing severe rash involving mucus membranes or skin necrosis: No Has patient had a PCN reaction that required hospitalization No Has patient had a PCN reaction occurring within the last 10 years: No If all of the above answers are "NO", then may proceed with Cephalosporin use.   . Shrimp [Shellfish Allergy] Anaphylaxis    Per patient "shrimp only"  . Sulfa Antibiotics Nausea And Vomiting  . Azactam [Aztreonam]     Hand swelling   . Azelastine Hcl     Rash   . Ciprofloxacin Other (See Comments)    dizziness  . Claritin [Loratadine]     Irritability Nervousness   . Methotrexate Derivatives   .  Nasacort [Triamcinolone]     Dizzy   . Olopatadine Other (See Comments)    Pain and lethargy   . Sulfamethizole Other (See Comments)    unknown  . Zantac [Ranitidine Hcl] Other (See Comments)    unknown  . Claritin-D 12 Hour [Loratadine-Pseudoephedrine Er] Anxiety    Allergies as of 07/08/2020      Reactions   Adhesive [tape] Rash   Celebrex [celecoxib] Hives   Ciprofibrate Nausea Only   Cymbalta [duloxetine Hcl] Swelling   Gabitril [tiagabine] Swelling   Keflex [cephalexin] Nausea And Vomiting   Lyrica [pregabalin] Swelling   Neurontin [gabapentin] Swelling   Nexium [esomeprazole]  Rash   Nsaids Rash   Penicillins Rash   Injection site reaction. Tolerated cefepime in past Has patient had a PCN reaction causing immediate rash, facial/tongue/throat swelling, SOB or lightheadedness with hypotension: No Has patient had a PCN reaction causing severe rash involving mucus membranes or skin necrosis: No Has patient had a PCN reaction that required hospitalization No Has patient had a PCN reaction occurring within the last 10 years: No If all of the above answers are "NO", then may proceed with Cephalosporin use.   Shrimp [shellfish Allergy] Anaphylaxis   Per patient "shrimp only"   Sulfa Antibiotics Nausea And Vomiting   Azactam [aztreonam]    Hand swelling    Azelastine Hcl    Rash    Ciprofloxacin Other (See Comments)   dizziness   Claritin [loratadine]    Irritability Nervousness    Methotrexate Derivatives    Nasacort [triamcinolone]    Dizzy    Olopatadine Other (See Comments)   Pain and lethargy    Sulfamethizole Other (See Comments)   unknown   Zantac [ranitidine Hcl] Other (See Comments)   unknown   Claritin-d 12 Hour [loratadine-pseudoephedrine Er] Anxiety      Medication List       Accurate as of July 08, 2020 11:59 PM. If you have any questions, ask your nurse or doctor.        STOP taking these medications   leflunomide 10 MG tablet Commonly known as: ARAVA Stopped by: Cayden Granholm X Viktoriya Glaspy, NP     TAKE these medications   acetaminophen 500 MG tablet Commonly known as: TYLENOL Take 500 mg by mouth every 6 (six) hours.   atorvastatin 20 MG tablet Commonly known as: LIPITOR Take 1 tablet (20 mg total) by mouth daily.   Biotin Maximum Strength 10 MG Tabs Generic drug: Biotin Take 10 mg by mouth daily.   CRANBERRY PO Take 1 capsule by mouth daily.   cyanocobalamin 1000 MCG/ML injection Commonly known as: (VITAMIN B-12) 1000 mcg injection once per month. What changed:   how much to take  how to take this  when to take this     cycloSPORINE 0.05 % ophthalmic emulsion Commonly known as: RESTASIS Place 1 drop into both eyes 2 (two) times daily.   diclofenac Sodium 1 % Gel Commonly known as: VOLTAREN APPLY 2 TO 4 GRAMS TOPICALLY TO AFFECTED JOINTS UP TO 4 TIMES DAILY What changed: See the new instructions.   EPINEPHrine 0.3 mg/0.3 mL Soaj injection Commonly known as: EPI-PEN Inject 0.3 mg into the muscle as needed for anaphylaxis.   fexofenadine 180 MG tablet Commonly known as: ALLEGRA Take 180 mg by mouth daily.   fluticasone 50 MCG/ACT nasal spray Commonly known as: FLONASE Place 2 sprays into both nostrils daily.   furosemide 80 MG tablet Commonly known as: LASIX Take 1 tablet (80  mg total) by mouth daily.   ketoconazole 2 % shampoo Commonly known as: NIZORAL Apply 1 application topically once a week.   lansoprazole 30 MG capsule Commonly known as: PREVACID Take one capsule by mouth once daily at 12 noon.   MELATONIN PO Take 10 mg by mouth daily.   montelukast 10 MG tablet Commonly known as: SINGULAIR Take 1 tablet (10 mg total) by mouth at bedtime.   potassium chloride 10 MEQ tablet Commonly known as: KLOR-CON Take 2 tablets (20 mEq total) by mouth 2 (two) times daily. What changed:   how much to take  when to take this   predniSONE 10 MG tablet Commonly known as: DELTASONE TAKE 1 TABLET (10 MG TOTAL) BY MOUTH DAILY WITH BREAKFAST.   SYRINGE 3CC/25GX1" 25G X 1" 3 ML Misc 1 application by Does not apply route every 30 (thirty) days.   traMADol 50 MG tablet Commonly known as: ULTRAM TAKE 1 TABLET BY MOUTH 4 TIMES A DAY   Vitamin D3 125 MCG (5000 UT) Caps Take 5,000 Units by mouth daily.       Review of Systems:  Review of Systems  Constitutional: Positive for fatigue. Negative for appetite change and fever.  HENT: Negative for congestion, trouble swallowing and voice change.   Eyes: Negative for visual disturbance.  Respiratory: Negative for cough, shortness of breath  and wheezing.   Cardiovascular: Negative for chest pain, palpitations and leg swelling.  Gastrointestinal: Negative for abdominal pain, constipation, nausea and vomiting.       Discomfort  Genitourinary: Negative for difficulty urinating, dysuria and urgency.  Musculoskeletal: Positive for arthralgias, back pain, gait problem and myalgias.  Skin: Negative for color change.  Neurological: Negative for speech difficulty, weakness, light-headedness and headaches.  Psychiatric/Behavioral: Negative for behavioral problems, confusion and sleep disturbance. The patient is not nervous/anxious.     Health Maintenance  Topic Date Due  . TETANUS/TDAP  Never done  . INFLUENZA VACCINE  07/11/2020  . DEXA SCAN  Completed  . COVID-19 Vaccine  Completed  . PNA vac Low Risk Adult  Completed    Physical Exam: Vitals:   07/08/20 1358  BP: (!) 122/88  Pulse: (!) 109  Temp: (!) 96 F (35.6 C)  SpO2: 94%  Weight: 165 lb 12.8 oz (75.2 kg)  Height: _0  (1.626 m)   Body mass index is 28.46 kg/m. Physical Exam Vitals and nursing note reviewed.  Constitutional:      Appearance: Normal appearance.  HENT:     Head: Normocephalic and atraumatic.     Nose: Nose normal.     Mouth/Throat:     Mouth: Mucous membranes are moist.  Eyes:     Extraocular Movements: Extraocular movements intact.     Conjunctiva/sclera: Conjunctivae normal.     Pupils: Pupils are equal, round, and reactive to light.  Neck:     Comments: C/o left upper back/neck pain, better with warm compress, will work with therapy Cardiovascular:     Rate and Rhythm: Normal rate and regular rhythm.     Heart sounds: No murmur heard.   Pulmonary:     Breath sounds: Normal breath sounds. No wheezing or rhonchi.  Abdominal:     General: Bowel sounds are normal.     Palpations: Abdomen is soft.     Tenderness: There is no right CVA tenderness, left CVA tenderness, guarding or rebound.     Comments: Discomfort abd  Musculoskeletal:      Cervical back: Normal range of  motion and neck supple. No rigidity or tenderness.     Right lower leg: No edema.     Left lower leg: No edema.  Skin:    General: Skin is warm and dry.  Neurological:     General: No focal deficit present.     Mental Status: She is alert and oriented to person, place, and time. Mental status is at baseline.     Motor: No weakness.     Coordination: Coordination normal.     Gait: Gait abnormal.  Psychiatric:        Mood and Affect: Mood normal.        Behavior: Behavior normal.        Thought Content: Thought content normal.        Judgment: Judgment normal.     Labs reviewed: Basic Metabolic Panel: Recent Labs    08/21/19 1520 08/21/19 1520 04/06/20 0730 05/13/20 1123 06/27/20 0557 06/28/20 0542 06/28/20 1813  NA 140   < > 142   < > 146* 146* 143  K 3.9   < > 4.0   < > 3.2* 2.9* 4.4  CL 101   < > 103   < > 111 106 105  CO2 28   < > 30   < > _0 GLUCOSE 165*   < > 91   < > 87 90 124*  BUN 27*   < > 26*   < > 6* 7* 9  CREATININE 1.16  --  1.05*   < > 0.66 0.57 0.63  CALCIUM 10.1   < > 10.3   < > 9.3 9.5 9.4  MG 1.8  --   --   --   --   --   --   TSH 0.59  --  3.16  --   --   --   --    < > = values in this interval not displayed.   Liver Function Tests: Recent Labs    08/21/19 1520 04/06/20 0730 05/13/20 1123 06/23/20 1323 06/25/20 0941  AST 17   < > _1 ALT 17   < > _2 ALKPHOS 45  --   --  49 43  BILITOT 0.5   < > 0.6 1.2 1.1  PROT 6.7   < > 7.4 7.3 6.3*  ALBUMIN 4.0  --   --  3.3* 2.7*   < > = values in this interval not displayed.   Recent Labs    06/23/20 1323  LIPASE 21   No results for input(s): AMMONIA in the last 8760 hours. CBC: Recent Labs    05/13/20 1123 05/13/20 1123 06/23/20 1323 06/24/20 0501 06/25/20 0849 06/25/20 0849 06/26/20 0518 06/27/20 0557 06/28/20 0542  WBC 13.7*   < > 17.3*   < > 10.9*   < > 9.5 8.1 9.4  NEUTROABS 9,987*  --  12.8*  --  7.8*  --   --   --   --     HGB 14.2   < > 13.6   < > 11.5*   < > 11.8* 12.3 12.2  HCT 43.9   < > 42.4   < > 37.1   < > 38.2 38.5 38.5  MCV 96.5   < > 100.2*   < > 102.2*   < > 101.6* 99.5 98.7  PLT 306   < > 272   < > 256   < > 262 272 278   < > =  values in this interval not displayed.   Lipid Panel: Recent Labs    04/06/20 0730  CHOL 176  HDL 67  LDLCALC 82  TRIG 171*  CHOLHDL 2.6   No results found for: HGBA1C  Procedures since last visit: CT ABDOMEN PELVIS W CONTRAST  Result Date: 06/23/2020 CLINICAL DATA:  Weakness, fever, dehydration EXAM: CT ABDOMEN AND PELVIS WITH CONTRAST TECHNIQUE: Multidetector CT imaging of the abdomen and pelvis was performed using the standard protocol following bolus administration of intravenous contrast. CONTRAST:  130m OMNIPAQUE IOHEXOL 300 MG/ML  SOLN COMPARISON:  02/03/2018 FINDINGS: Lower chest: Stable subpleural scarring, with scattered tree in bud opacities at the bases compatible with chronic indolent infection such as MAC. Otherwise no acute pleural or parenchymal lung disease. Hepatobiliary: No focal liver abnormality is seen. No gallstones, gallbladder wall thickening, or biliary dilatation. Pancreas: Unremarkable. No pancreatic ductal dilatation or surrounding inflammatory changes. Spleen: Normal in size without focal abnormality. Adrenals/Urinary Tract: Adrenal glands are unremarkable. Kidneys are normal, without renal calculi, focal lesion, or hydronephrosis. Bladder is unremarkable. Stomach/Bowel: No bowel obstruction or ileus. There is wall thickening of the sigmoid colon, with surrounding pericolonic fat stranding. There is free gas within the adjacent mesentery, consistent with perforated sigmoid diverticulitis. There is no fluid collection or abscess. Normal appendix is seen in the right mid abdomen. Vascular/Lymphatic: Aortic atherosclerosis. No enlarged abdominal or pelvic lymph nodes. Reproductive: Uterus and right adnexa are unremarkable. There is a 3.4 cm simple  appearing cyst within the left ovary, previous having measured 2.9 cm. Other: No free fluid. Free gas in the left lower quadrant adjacent to the sigmoid diverticulitis. No abdominal wall hernia. Musculoskeletal: No acute or destructive bony lesions. Severe osteoarthritis within the right hip. Postsurgical changes lower lumbar spine. Reconstructed images demonstrate no additional findings. IMPRESSION: 1. Acute perforated sigmoid diverticulitis, with pneumoperitoneum in the left lower quadrant. No fluid collection or abscess. 2. A 3.4 cm simple appearing left ovarian cyst, previous having measured 2.9 cm. Pelvic ultrasound recommended if not previously performed. This recommendation follows ACR consensus guidelines: White Paper of the ACR Incidental Findings Committee II on Adnexal Findings. J Am Coll Radiol 2013:10:675-681. 3. Aortic Atherosclerosis (ICD10-I70.0). These results were called by telephone at the time of interpretation on 06/23/2020 at 3:26 pm to provider KEmeterio Reeve, who verbally acknowledged these results. Electronically Signed   By: MRanda NgoM.D.   On: 06/23/2020 15:33   DG Chest Portable 1 View  Result Date: 06/23/2020 CLINICAL DATA:  Nausea and weakness. EXAM: PORTABLE CHEST 1 VIEW COMPARISON:  06/09/1999 FINDINGS: Hypoventilation. Decreased lung volume with bibasilar atelectasis left greater than right. Negative for heart failure. No pleural effusion. ACDF cervical spine. IMPRESSION: Hypoventilation with bibasilar atelectasis. Electronically Signed   By: CFranchot GalloM.D.   On: 06/23/2020 13:56    Assessment/Plan  Perforation of sigmoid colon due to diverticulitis Hospitalized 06/23/20-06/29/20 for abd pain, CT abd/pelvis acute sigmoid diverticulitis with pneumoperitoneum, general surgery recommended conservative tx with ABT Omnicef/Falgyl x 14 days, f/u Dr. KKieth Brightly8/10/21, colonoscopy advised 4-6 wks. Update CBC/diff, CMP/eGFR  GERD (gastroesophageal reflux  disease) Stable, continue Prevacid.   Rheumatoid arthritis ([place] Managed, continue Prednisone, Tramadol   B12 deficiency Takes Vit B12, update Vit B12 level.   Bilateral lower extremity edema takes Furosemide 821mqd, Kcl  Asthenia Asthma Epinephrine prn, Allegra, Flonase, Singulair   Labs/tests ordered: Vit B12, CBC/diff, CMP/eGFR  Next appt:  07/16/2020

## 2020-07-08 NOTE — Assessment & Plan Note (Signed)
Stable, continue Prevacid.  

## 2020-07-08 NOTE — Assessment & Plan Note (Addendum)
Takes Vit B12, update Vit B12 level.  

## 2020-07-08 NOTE — Assessment & Plan Note (Addendum)
Hospitalized 06/23/20-06/29/20 for abd pain, CT abd/pelvis acute sigmoid diverticulitis with pneumoperitoneum, general surgery recommended conservative tx with ABT Omnicef/Falgyl x 14 days, f/u Dr. [name] 07/20/20, colonoscopy advised 4-6 wks. Update CBC/diff, CMP/eGFR

## 2020-07-08 NOTE — Assessment & Plan Note (Signed)
takes Furosemide 80mg  qd, Kcl

## 2020-07-08 NOTE — Patient Instructions (Signed)
Pending surgeon f/u, will schedule labs.

## 2020-07-08 NOTE — Assessment & Plan Note (Signed)
Asthma Epinephrine prn, Allegra, Flonase, Singulair 

## 2020-07-08 NOTE — Assessment & Plan Note (Signed)
Managed, continue Prednisone, Tramadol

## 2020-07-10 ENCOUNTER — Encounter: Payer: Self-pay | Admitting: Nurse Practitioner

## 2020-07-11 ENCOUNTER — Other Ambulatory Visit: Payer: Self-pay | Admitting: Internal Medicine

## 2020-07-12 DIAGNOSIS — M6281 Muscle weakness (generalized): Secondary | ICD-10-CM | POA: Diagnosis not present

## 2020-07-12 DIAGNOSIS — R2681 Unsteadiness on feet: Secondary | ICD-10-CM | POA: Diagnosis not present

## 2020-07-12 DIAGNOSIS — K5700 Diverticulitis of small intestine with perforation and abscess without bleeding: Secondary | ICD-10-CM | POA: Diagnosis not present

## 2020-07-12 DIAGNOSIS — M542 Cervicalgia: Secondary | ICD-10-CM | POA: Diagnosis not present

## 2020-07-12 NOTE — Telephone Encounter (Signed)
Received refill from pharmacy Pended Rx and sent to Dr. Lyndel Safe for approval due to [place] Warning.

## 2020-07-13 ENCOUNTER — Other Ambulatory Visit: Payer: Self-pay

## 2020-07-13 ENCOUNTER — Other Ambulatory Visit: Payer: Self-pay | Admitting: Nurse Practitioner

## 2020-07-13 DIAGNOSIS — M0579 Rheumatoid arthritis with rheumatoid factor of multiple sites without organ or systems involvement: Secondary | ICD-10-CM

## 2020-07-13 DIAGNOSIS — R600 Localized edema: Secondary | ICD-10-CM

## 2020-07-13 DIAGNOSIS — M797 Fibromyalgia: Secondary | ICD-10-CM

## 2020-07-13 DIAGNOSIS — E538 Deficiency of other specified B group vitamins: Secondary | ICD-10-CM | POA: Diagnosis not present

## 2020-07-13 DIAGNOSIS — K5720 Diverticulitis of large intestine with perforation and abscess without bleeding: Secondary | ICD-10-CM | POA: Diagnosis not present

## 2020-07-13 DIAGNOSIS — M47816 Spondylosis without myelopathy or radiculopathy, lumbar region: Secondary | ICD-10-CM

## 2020-07-13 LAB — CBC WITH DIFFERENTIAL/PLATELET
Absolute Monocytes: 1302 cells/uL — ABNORMAL HIGH (ref 200–950)
Basophils Absolute: 96 cells/uL (ref 0–200)
Basophils Relative: 0.7 %
Eosinophils Absolute: 137 cells/uL (ref 15–500)
Eosinophils Relative: 1 %
HCT: 42 % (ref 35.0–45.0)
Hemoglobin: 13.9 g/dL (ref 11.7–15.5)
Lymphs Abs: 3932 cells/uL — ABNORMAL HIGH (ref 850–3900)
MCH: 31.4 pg (ref 27.0–33.0)
MCHC: 33.1 g/dL (ref 32.0–36.0)
MCV: 95 fL (ref 80.0–100.0)
MPV: 10.4 fL (ref 7.5–12.5)
Monocytes Relative: 9.5 %
Neutro Abs: 8234 cells/uL — ABNORMAL HIGH (ref 1500–7800)
Neutrophils Relative %: 60.1 %
Platelets: 354 10*3/uL (ref 140–400)
RBC: 4.42 10*6/uL (ref 3.80–5.10)
RDW: 12.6 % (ref 11.0–15.0)
Total Lymphocyte: 28.7 %
WBC: 13.7 10*3/uL — ABNORMAL HIGH (ref 3.8–10.8)

## 2020-07-13 LAB — VITAMIN B12: Vitamin B-12: >2000 pg/mL — ABNORMAL HIGH (ref 200–1100)

## 2020-07-13 LAB — COMPLETE METABOLIC PANEL WITH GFR
AG Ratio: 1.3 (calc) (ref 1.0–2.5)
ALT: 13 U/L (ref 6–29)
AST: 19 U/L (ref 10–35)
Albumin: 4.2 g/dL (ref 3.6–5.1)
Alkaline phosphatase (APISO): 48 U/L (ref 37–153)
BUN/Creatinine Ratio: 20 (calc) (ref 6–22)
BUN: 23 mg/dL (ref 7–25)
CO2: 27 mmol/L (ref 20–32)
Calcium: 10.1 mg/dL (ref 8.6–10.4)
Chloride: 100 mmol/L (ref 98–110)
Creat: 1.14 mg/dL — ABNORMAL HIGH (ref 0.60–0.88)
GFR, Est African American: 51 mL/min/{1.73_m2} — ABNORMAL LOW (ref >=60)
GFR, Est Non African American: 44 mL/min/{1.73_m2} — ABNORMAL LOW (ref >=60)
Globulin: 3.3 g/dL (calc) (ref 1.9–3.7)
Glucose, Bld: 87 mg/dL (ref 65–99)
Potassium: 4.3 mmol/L (ref 3.5–5.3)
Sodium: 141 mmol/L (ref 135–146)
Total Bilirubin: 0.5 mg/dL (ref 0.2–1.2)
Total Protein: 7.5 g/dL (ref 6.1–8.1)

## 2020-07-13 NOTE — Telephone Encounter (Signed)
Tramadol was last filled on 07/01/2020 #120 (12 days ago)   Refill request is too early

## 2020-07-14 ENCOUNTER — Encounter: Payer: Self-pay | Admitting: Internal Medicine

## 2020-07-15 DIAGNOSIS — M542 Cervicalgia: Secondary | ICD-10-CM | POA: Diagnosis not present

## 2020-07-15 DIAGNOSIS — K5700 Diverticulitis of small intestine with perforation and abscess without bleeding: Secondary | ICD-10-CM | POA: Diagnosis not present

## 2020-07-15 DIAGNOSIS — R2681 Unsteadiness on feet: Secondary | ICD-10-CM | POA: Diagnosis not present

## 2020-07-15 DIAGNOSIS — M6281 Muscle weakness (generalized): Secondary | ICD-10-CM | POA: Diagnosis not present

## 2020-07-15 NOTE — Telephone Encounter (Signed)
I called [facility] back since I had not heard from them and spoke with the pharmacist. Per the pharmacist rx for Tramadol was picked up on 07/09/2020.  I asked who signed rx out and the phramacist stated she would have to retrieve that information from a different system and call me back.   I will send this message to [name], MD for further review while I await return call from [facility] pharmacist

## 2020-07-15 NOTE — Telephone Encounter (Signed)
This patient will need to sign a treatment agreement that will indicate 1 pharmacy where she is to get medication filled to avoid polypharmacy use of a controlled substance.   I called [facility] and left detailed message on voicemail requesting a return call to inquire about who signed rx out on 07/01/2020 that we sent over.

## 2020-07-16 ENCOUNTER — Encounter: Payer: Self-pay | Admitting: Internal Medicine

## 2020-07-16 ENCOUNTER — Non-Acute Institutional Stay: Payer: Medicare PPO | Admitting: Internal Medicine

## 2020-07-16 ENCOUNTER — Other Ambulatory Visit: Payer: Self-pay

## 2020-07-16 VITALS — BP 116/80 | HR 112 | Temp 96.8°F | Ht 64.0 in | Wt 163.4 lb

## 2020-07-16 DIAGNOSIS — M0579 Rheumatoid arthritis with rheumatoid factor of multiple sites without organ or systems involvement: Secondary | ICD-10-CM | POA: Diagnosis not present

## 2020-07-16 DIAGNOSIS — D72829 Elevated white blood cell count, unspecified: Secondary | ICD-10-CM | POA: Diagnosis not present

## 2020-07-16 DIAGNOSIS — M797 Fibromyalgia: Secondary | ICD-10-CM

## 2020-07-16 DIAGNOSIS — R197 Diarrhea, unspecified: Secondary | ICD-10-CM

## 2020-07-16 DIAGNOSIS — M47816 Spondylosis without myelopathy or radiculopathy, lumbar region: Secondary | ICD-10-CM | POA: Diagnosis not present

## 2020-07-16 MED ORDER — TRAMADOL HCL 50 MG PO TABS: ORAL_TABLET | ORAL | 0 refills | Status: DC

## 2020-07-16 MED ORDER — METHOCARBAMOL 500 MG PO TABS: 500.0000 mg | ORAL_TABLET | Freq: Three times a day (TID) | ORAL | 0 refills | Status: DC | PRN

## 2020-07-16 NOTE — Progress Notes (Signed)
Location: [place] [facility] Service:  Clinic (12)  Provider:   Code Status:  Goals of Care:  Advanced Directives 06/23/2020  Does Patient Have a Medical Advance Directive? No  Type of Advance Directive -  Does patient want to make changes to medical advance directive? -  Copy of [place] in Chart? -  Would patient like information on creating a medical advance directive? No - Patient declined     Chief Complaint  Patient presents with  . Medical Management of Chronic Issues    HPI: Patient is a 86 y.o. female seen today for an acute visit for Diarrhea, Weakness And Pain Meds Renewal  Patient has a history of seropositive rheumatoid arthritis follows with Dr. Estanislado Pandy History of allergic rhinitis, history of adrenal insufficiency, lower extremity edema, osteoporosis, chronic pain,, fibromyalgia Was admitted in the hospital from 7/14-7/20 first sigmoid colon diverticulitis with perforation and sepsis.  Came in today for follow-up. Her main complaint today was feeling weak and having diarrhea. Its mostly loose stools.  Poor appetite.  No abdominal pain no fever no cramps. Denies any shortness of breath or dysuria or cough. Also having some difficulty with pain in her multiple joints as she was unable to fill up her tramadol due to some confusion with pharmacy. Denies any dizziness.  Has not had any falls. Was advised to go to assisted living in the hospital but refused. Is planning to hire a new caregiver to help her.   Past Medical History:  Diagnosis Date  . Adrenal failure ([place])   . Arthritis   . Asthma   . Cancer ([place])   . Cataract   . Closed nondisplaced fracture of fifth right metatarsal bone 09/18/2017  . Fibromyalgia 2008  . HCAP (healthcare-associated pneumonia) 02/03/2018  . Osteoporosis   . RA (rheumatoid arthritis) ([place])   . Recurrent upper respiratory infection (URI)   . Sepsis due to urinary tract infection ([place])  01/18/2018  . Urticaria     Past Surgical History:  Procedure Laterality Date  . BACK SURGERY    . BILATERAL CARPAL TUNNEL RELEASE  2005   right and left  . CATARACT EXTRACTION, BILATERAL  2004   right and left  . CERVICAL FUSION  2011,2010,2008   2 disks  . HEEL SPUR SURGERY  2004  . lower back fusion  2011   Fusion of 3-4 and 4-5 lower back  . [place]  . ROTATOR CUFF REPAIR  [id number]  . SQUAMOUS CELL CARCINOMA EXCISION    . TONSILLECTOMY AND ADENOIDECTOMY  1947  . TOTAL SHOULDER ARTHROPLASTY      Allergies  Allergen Reactions  . Adhesive [Tape] Rash  . Celebrex [Celecoxib] Hives  . Ciprofibrate Nausea Only  . Cymbalta [Duloxetine Hcl] Swelling  . Gabitril [Tiagabine] Swelling  . Keflex [Cephalexin] Nausea And Vomiting  . Lyrica [Pregabalin] Swelling  . Neurontin [Gabapentin] Swelling  . Nexium [Esomeprazole] Rash  . Nsaids Rash  . Penicillins Rash    Injection site reaction. Tolerated cefepime in past Has patient had a PCN reaction causing immediate rash, facial/tongue/throat swelling, SOB or lightheadedness with hypotension: No Has patient had a PCN reaction causing severe rash involving mucus membranes or skin necrosis: No Has patient had a PCN reaction that required hospitalization No Has patient had a PCN reaction occurring within the last 10 years: No If all of the above answers are "NO", then may proceed with Cephalosporin use.   . Shrimp [  Shellfish Allergy] Anaphylaxis    Per patient "shrimp only"  . Sulfa Antibiotics Nausea And Vomiting  . Azactam [Aztreonam]     Hand swelling   . Azelastine Hcl     Rash   . Ciprofloxacin Other (See Comments)    dizziness  . Claritin [Loratadine]     Irritability Nervousness   . Methotrexate Derivatives   . Nasacort [Triamcinolone]     Dizzy   . Olopatadine Other (See Comments)    Pain and lethargy   . Sulfamethizole Other (See Comments)    unknown  . Zantac [Ranitidine Hcl] Other (See  Comments)    unknown  . Claritin-D 12 Hour [Loratadine-Pseudoephedrine Er] Anxiety    Outpatient Encounter Medications as of 07/16/2020  Medication Sig  . acetaminophen (TYLENOL) 500 MG tablet Take 500 mg by mouth every 6 (six) hours.   Marland Kitchen atorvastatin (LIPITOR) 20 MG tablet Take 1 tablet (20 mg total) by mouth daily.  . Biotin (BIOTIN MAXIMUM STRENGTH) 10 MG TABS Take 10 mg by mouth daily.  . Cholecalciferol (VITAMIN D3) 5000 units CAPS Take 5,000 Units by mouth daily.  Marland Kitchen CRANBERRY PO Take 1 capsule by mouth daily.  . cyanocobalamin (,VITAMIN B-12,) 1000 MCG/ML injection 1000 mcg injection once per month. (Patient taking differently: Inject 1,000 mcg into the muscle every 30 (thirty) days. 1000 mcg injection once per month.)  . cycloSPORINE (RESTASIS) 0.05 % ophthalmic emulsion Place 1 drop into both eyes 2 (two) times daily.   . diclofenac Sodium (VOLTAREN) 1 % GEL APPLY 2 TO 4 GRAMS TOPICALLY TO AFFECTED JOINTS UP TO 4 TIMES DAILY (Patient taking differently: Apply 2 g topically daily as needed (for pain). )  . EPINEPHrine 0.3 mg/0.3 mL IJ SOAJ injection Inject 0.3 mg into the muscle as needed for anaphylaxis.   . fexofenadine (ALLEGRA) 180 MG tablet Take 180 mg by mouth daily.  . fluticasone (FLONASE) 50 MCG/ACT nasal spray SPRAY 2 SPRAYS INTO EACH NOSTRIL EVERY DAY  . furosemide (LASIX) 80 MG tablet Take 1 tablet (80 mg total) by mouth daily.  Marland Kitchen ketoconazole (NIZORAL) 2 % shampoo Apply 1 application topically once a week.   . lansoprazole (PREVACID) 30 MG capsule Take one capsule by mouth once daily at 12 noon.  Marland Kitchen MELATONIN PO Take 10 mg by mouth daily.   . montelukast (SINGULAIR) 10 MG tablet Take 1 tablet (10 mg total) by mouth at bedtime.  . potassium chloride (KLOR-CON) 10 MEQ tablet Take 2 tablets (20 mEq total) by mouth 2 (two) times daily. (Patient taking differently: Take 10 mEq by mouth in the morning, at noon, in the evening, and at bedtime. )  . predniSONE (DELTASONE) 10 MG  tablet TAKE 1 TABLET (10 MG TOTAL) BY MOUTH DAILY WITH BREAKFAST.  Marland Kitchen Syringe/Needle, Disp, (SYRINGE 3CC/25GX1") 25G X 1" 3 ML MISC 1 application by Does not apply route every 30 (thirty) days.  . traMADol (ULTRAM) 50 MG tablet TAKE 1 TABLET BY MOUTH 4 TIMES A DAY  . [DISCONTINUED] traMADol (ULTRAM) 50 MG tablet TAKE 1 TABLET BY MOUTH 4 TIMES A DAY  . methocarbamol (ROBAXIN) 500 MG tablet Take 1 tablet (500 mg total) by mouth every 8 (eight) hours as needed for muscle spasms.   No facility-administered encounter medications on file as of 07/16/2020.    Review of Systems:  Review of Systems  Constitutional: Positive for activity change and appetite change.  HENT: Negative.   Respiratory: Negative.   Cardiovascular: Negative.   Gastrointestinal: Positive for diarrhea.  Genitourinary: Negative.   Musculoskeletal: Positive for arthralgias, gait problem and myalgias.  Skin: Negative.   Neurological: Positive for weakness.  Psychiatric/Behavioral: Positive for dysphoric mood.    Health Maintenance  Topic Date Due  . TETANUS/TDAP  Never done  . INFLUENZA VACCINE  07/11/2020  . DEXA SCAN  Completed  . COVID-19 Vaccine  Completed  . PNA vac Low Risk Adult  Completed    Physical Exam: Vitals:   07/16/20 1013  BP: 116/80  Pulse: (!) 112  Temp: (!) 96.8 F (36 C)  SpO2: 94%  Weight: 163 lb 6.4 oz (74.1 kg)  Height: _0  (1.626 m)   Body mass index is 28.05 kg/m. Physical Exam Vitals reviewed.  Constitutional:      Appearance: Normal appearance.  HENT:     Head: Normocephalic.     Right Ear: Tympanic membrane normal.     Left Ear: Tympanic membrane normal.     Ears:     Comments: Some wax in Left ears    Nose: Nose normal.     Mouth/Throat:     Mouth: Mucous membranes are moist.     Pharynx: Oropharynx is clear.  Eyes:     Pupils: Pupils are equal, round, and reactive to light.  Cardiovascular:     Rate and Rhythm: Normal rate and regular rhythm.     Pulses: Normal  pulses.  Pulmonary:     Effort: Pulmonary effort is normal. No respiratory distress.     Breath sounds: Normal breath sounds. No wheezing or rales.  Abdominal:     General: Abdomen is flat. Bowel sounds are normal. There is no distension.     Palpations: Abdomen is soft.     Tenderness: There is no abdominal tenderness. There is no guarding.  Musculoskeletal:     Cervical back: Neck supple.     Comments: Mild edema Bilateral  Skin:    General: Skin is warm.  Neurological:     General: No focal deficit present.     Mental Status: She is alert and oriented to person, place, and time.  Psychiatric:        Mood and Affect: Mood normal.     Labs reviewed: Basic Metabolic Panel: Recent Labs    08/21/19 1520 08/21/19 1520 04/06/20 0730 05/13/20 1123 06/28/20 0542 06/28/20 1813 07/13/20 0710  NA 140   < > 142   < > 146* 143 141  K 3.9   < > 4.0   < > 2.9* 4.4 4.3  CL 101   < > 103   < > 106 105 100  CO2 28   < > 30   < > _1 GLUCOSE 165*   < > 91   < > 90 124* 87  BUN 27*   < > 26*   < > 7* 9 23  CREATININE 1.16  --  1.05*   < > 0.57 0.63 1.14*  CALCIUM 10.1   < > 10.3   < > 9.5 9.4 10.1  MG 1.8  --   --   --   --   --   --   TSH 0.59  --  3.16  --   --   --   --    < > = values in this interval not displayed.   Liver Function Tests: Recent Labs    08/21/19 1520 04/06/20 0730 06/23/20 1323 06/25/20 0941 07/13/20 0710  AST 17   < > 18 20  19  ALT 17   < > _0 ALKPHOS 45  --  49 43  --   BILITOT 0.5   < > 1.2 1.1 0.5  PROT 6.7   < > 7.3 6.3* 7.5  ALBUMIN 4.0  --  3.3* 2.7*  --    < > = values in this interval not displayed.   Recent Labs    06/23/20 1323  LIPASE 21   No results for input(s): AMMONIA in the last 8760 hours. CBC: Recent Labs    06/23/20 1323 06/24/20 0501 06/25/20 0849 06/26/20 0518 06/27/20 0557 06/28/20 0542 07/13/20 0710  WBC 17.3*   < > 10.9*   < > 8.1 9.4 13.7*  NEUTROABS 12.8*  --  7.8*  --   --   --  8,234*  HGB 13.6    < > 11.5*   < > 12.3 12.2 13.9  HCT 42.4   < > 37.1   < > 38.5 38.5 42.0  MCV 100.2*   < > 102.2*   < > 99.5 98.7 95.0  PLT 272   < > 256   < > 272 278 354   < > = values in this interval not displayed.   Lipid Panel: Recent Labs    04/06/20 0730  CHOL 176  HDL 67  LDLCALC 82  TRIG 171*  CHOLHDL 2.6   No results found for: HGBA1C  Procedures since last visit: CT ABDOMEN PELVIS W CONTRAST  Result Date: 06/23/2020 CLINICAL DATA:  Weakness, fever, dehydration EXAM: CT ABDOMEN AND PELVIS WITH CONTRAST TECHNIQUE: Multidetector CT imaging of the abdomen and pelvis was performed using the standard protocol following bolus administration of intravenous contrast. CONTRAST:  178m OMNIPAQUE IOHEXOL 300 MG/ML  SOLN COMPARISON:  02/03/2018 FINDINGS: Lower chest: Stable subpleural scarring, with scattered tree in bud opacities at the bases compatible with chronic indolent infection such as MAC. Otherwise no acute pleural or parenchymal lung disease. Hepatobiliary: No focal liver abnormality is seen. No gallstones, gallbladder wall thickening, or biliary dilatation. Pancreas: Unremarkable. No pancreatic ductal dilatation or surrounding inflammatory changes. Spleen: Normal in size without focal abnormality. Adrenals/Urinary Tract: Adrenal glands are unremarkable. Kidneys are normal, without renal calculi, focal lesion, or hydronephrosis. Bladder is unremarkable. Stomach/Bowel: No bowel obstruction or ileus. There is wall thickening of the sigmoid colon, with surrounding pericolonic fat stranding. There is free gas within the adjacent mesentery, consistent with perforated sigmoid diverticulitis. There is no fluid collection or abscess. Normal appendix is seen in the right mid abdomen. Vascular/Lymphatic: Aortic atherosclerosis. No enlarged abdominal or pelvic lymph nodes. Reproductive: Uterus and right adnexa are unremarkable. There is a 3.4 cm simple appearing cyst within the left ovary, previous having  measured 2.9 cm. Other: No free fluid. Free gas in the left lower quadrant adjacent to the sigmoid diverticulitis. No abdominal wall hernia. Musculoskeletal: No acute or destructive bony lesions. Severe osteoarthritis within the right hip. Postsurgical changes lower lumbar spine. Reconstructed images demonstrate no additional findings. IMPRESSION: 1. Acute perforated sigmoid diverticulitis, with pneumoperitoneum in the left lower quadrant. No fluid collection or abscess. 2. A 3.4 cm simple appearing left ovarian cyst, previous having measured 2.9 cm. Pelvic ultrasound recommended if not previously performed. This recommendation follows ACR consensus guidelines: White Paper of the ACR Incidental Findings Committee II on Adnexal Findings. J Am Coll Radiol 2013:10:675-681. 3. Aortic Atherosclerosis (ICD10-I70.0). These results were called by telephone at the time of interpretation on 06/23/2020 at 3:26 pm to provider [facility],  who verbally acknowledged these results. Electronically Signed   By: Randa Ngo M.D.   On: 06/23/2020 15:33   DG Chest Portable 1 View  Result Date: 06/23/2020 CLINICAL DATA:  Nausea and weakness. EXAM: PORTABLE CHEST 1 VIEW COMPARISON:  06/09/1999 FINDINGS: Hypoventilation. Decreased lung volume with bibasilar atelectasis left greater than right. Negative for heart failure. No pleural effusion. ACDF cervical spine. IMPRESSION: Hypoventilation with bibasilar atelectasis. Electronically Signed   By: Franchot Gallo M.D.   On: 06/23/2020 13:56    Assessment/Plan Diarrhea, unspecified type with leukocytosis and mild CKD- Plan: Clostridium difficile Toxin B, Qualitative, Real-Time PCR([facility]), Fecal leukocytes, Gastrointestinal Pathogen Panel PCR, CBC with Differential/Platelet, CMP with eGFR([facility]), Lipase  Was recently in Hospital Treated with [facility] Antibiotics She will go to ED if Spikes fever, Worsening Abdominal Pain or Worsening Diarrhea Does not want to go to [place]  right now Patient will also call Dr. Perley Jain office for appointment Also Decreased her Lasix to 40 mg for 1 week And Potassium to 40 meq for 1 week  Fibromyalgia - Plan: traMADol (ULTRAM) 50 MG tablet 2-week supply till prescription from pharmacy clears up  Rheumatoid arthritis involving multiple sites with positive rheumatoid factor ([place]) - Plan: traMADol (ULTRAM) 50 MG tablet Did not tolerate Leflunomide Took herself off it   Spondylosis of lumbar region without myelopathy or radiculopathy - Plan: traMADol (ULTRAM) 50 MG tablet Also renewed Robaxin 500 mg every 8 hours as needed Patient is working with therapy Leukocytosis, unspecified type Doing Stool studies Repeat CBC  LE edema Decrease the Lasix to 40 mg QD for 1 week due to Diarrhea  Other Issues  Hyperlipidemia, unspecified hyperlipidemia type LDL 82   Age-related osteoporosis without current pathological fracture On Reclast before per rheumatology Right now on drug holiday  Allergic conjunctivitis and rhinitis, bilateral On Allegra, fluticasone and Singulair  B12 deficiency She is on monthly injections History of cortisol deficiency Is supposed to be on chronic prednisone has seen endocrinology before Labs/tests ordered:  * No order type specified * Next appt:  07/29/2020

## 2020-07-19 ENCOUNTER — Other Ambulatory Visit: Payer: Self-pay | Admitting: Internal Medicine

## 2020-07-19 DIAGNOSIS — K5700 Diverticulitis of small intestine with perforation and abscess without bleeding: Secondary | ICD-10-CM | POA: Diagnosis not present

## 2020-07-19 DIAGNOSIS — M542 Cervicalgia: Secondary | ICD-10-CM | POA: Diagnosis not present

## 2020-07-19 DIAGNOSIS — M6281 Muscle weakness (generalized): Secondary | ICD-10-CM | POA: Diagnosis not present

## 2020-07-19 DIAGNOSIS — R2681 Unsteadiness on feet: Secondary | ICD-10-CM | POA: Diagnosis not present

## 2020-07-19 DIAGNOSIS — R197 Diarrhea, unspecified: Secondary | ICD-10-CM | POA: Diagnosis not present

## 2020-07-20 ENCOUNTER — Other Ambulatory Visit: Payer: Self-pay | Admitting: Nurse Practitioner

## 2020-07-20 ENCOUNTER — Encounter: Payer: Self-pay | Admitting: Internal Medicine

## 2020-07-20 ENCOUNTER — Other Ambulatory Visit: Payer: Self-pay

## 2020-07-20 DIAGNOSIS — R8290 Unspecified abnormal findings in urine: Secondary | ICD-10-CM

## 2020-07-20 NOTE — Progress Notes (Signed)
urine

## 2020-07-21 ENCOUNTER — Other Ambulatory Visit: Payer: Self-pay | Admitting: Internal Medicine

## 2020-07-22 ENCOUNTER — Encounter: Payer: Self-pay | Admitting: Nurse Practitioner

## 2020-07-22 ENCOUNTER — Other Ambulatory Visit: Payer: Self-pay

## 2020-07-22 ENCOUNTER — Non-Acute Institutional Stay: Payer: Medicare PPO | Admitting: Nurse Practitioner

## 2020-07-22 ENCOUNTER — Other Ambulatory Visit: Payer: Self-pay | Admitting: Nurse Practitioner

## 2020-07-22 DIAGNOSIS — K219 Gastro-esophageal reflux disease without esophagitis: Secondary | ICD-10-CM | POA: Diagnosis not present

## 2020-07-22 DIAGNOSIS — R2681 Unsteadiness on feet: Secondary | ICD-10-CM | POA: Diagnosis not present

## 2020-07-22 DIAGNOSIS — M542 Cervicalgia: Secondary | ICD-10-CM | POA: Diagnosis not present

## 2020-07-22 DIAGNOSIS — I872 Venous insufficiency (chronic) (peripheral): Secondary | ICD-10-CM | POA: Diagnosis not present

## 2020-07-22 DIAGNOSIS — R300 Dysuria: Secondary | ICD-10-CM | POA: Diagnosis not present

## 2020-07-22 DIAGNOSIS — R8290 Unspecified abnormal findings in urine: Secondary | ICD-10-CM | POA: Diagnosis not present

## 2020-07-22 DIAGNOSIS — L659 Nonscarring hair loss, unspecified: Secondary | ICD-10-CM

## 2020-07-22 DIAGNOSIS — K5700 Diverticulitis of small intestine with perforation and abscess without bleeding: Secondary | ICD-10-CM | POA: Diagnosis not present

## 2020-07-22 DIAGNOSIS — M6281 Muscle weakness (generalized): Secondary | ICD-10-CM | POA: Diagnosis not present

## 2020-07-22 DIAGNOSIS — J309 Allergic rhinitis, unspecified: Secondary | ICD-10-CM

## 2020-07-22 DIAGNOSIS — K5720 Diverticulitis of large intestine with perforation and abscess without bleeding: Secondary | ICD-10-CM | POA: Diagnosis not present

## 2020-07-22 DIAGNOSIS — M0579 Rheumatoid arthritis with rheumatoid factor of multiple sites without organ or systems involvement: Secondary | ICD-10-CM

## 2020-07-22 DIAGNOSIS — N390 Urinary tract infection, site not specified: Secondary | ICD-10-CM | POA: Insufficient documentation

## 2020-07-22 NOTE — Telephone Encounter (Signed)
Please advise if this is a long term medication

## 2020-07-22 NOTE — Progress Notes (Signed)
Location:   Clinic [facility]   Place of Service:  Clinic (12) Provider: Marlana Latus NP  Code Status: DNR Goals of Care: [place] 06/23/2020  Does Patient Have a Medical Advance Directive? No  Type of Advance Directive -  Does patient want to make changes to medical advance directive? -  Copy of [place] in Chart? -  Would patient like information on creating a medical advance directive? No - Patient declined     Chief Complaint  Patient presents with  . Acute Visit    Patient returns to the clinic for UTI symptoms.   . Health Maintenance    TDAP    HPI: Patient is a 86 y.o. female seen today for medical management of chronic diseases.     Odorous urine, dysuria x 2 days, denied urinary frequency/urgency, she is afebrile, denied nausea, vomiting, or suprapubic tenderness.   Hospitalized 06/23/20-06/29/20 for abd pain, CT abd/pelvis acute sigmoid diverticulitis with pneumoperitoneum, general surgery recommended conservative tx with ABT Omnicef/Falgyl , f/u Dr. [name], colonoscopy advised 4-6 wks. No further diarrhea since 4 days ago.              Asthma Epinephrine prn, Allegra, Flonase, Singulair             RA takes Prednisone, Tramadol             GERD Prevacid 56m qd             Venous insufficiency/Edema , takes Furosemide 879mqd, Kcl    Past Medical History:  Diagnosis Date  . Adrenal failure ([facility]  . Arthritis   . Asthma   . Cancer ([place]  . Cataract   . Closed nondisplaced fracture of fifth right metatarsal bone 09/18/2017  . Fibromyalgia 2008  . HCAP (healthcare-associated pneumonia) 02/03/2018  . Osteoporosis   . RA (rheumatoid arthritis) ([facility]  . Recurrent upper respiratory infection (URI)   . Sepsis due to urinary tract infection ([facility]  . Urticaria     Past Surgical History:  Procedure Laterality Date  . BACK SURGERY    . BILATERAL CARPAL TUNNEL RELEASE  2005   right and left  . CATARACT EXTRACTION, BILATERAL   2004   right and left  . CERVICAL FUSION  2011,2010,2008   2 disks  . HEEL SPUR SURGERY  2004  . lower back fusion  2011   Fusion of 3-4 and 4-5 lower back  . RAErie. ROTATOR CUFF REPAIR  [id number]. SQUAMOUS CELL CARCINOMA EXCISION    . TONSILLECTOMY AND ADENOIDECTOMY  1947  . TOTAL SHOULDER ARTHROPLASTY      Allergies  Allergen Reactions  . Adhesive [Tape] Rash  . Celebrex [Celecoxib] Hives  . Ciprofibrate Nausea Only  . Cymbalta [Duloxetine Hcl] Swelling  . Gabitril [Tiagabine] Swelling  . Keflex [Cephalexin] Nausea And Vomiting  . Lyrica [Pregabalin] Swelling  . Neurontin [Gabapentin] Swelling  . Nexium [Esomeprazole] Rash  . Nsaids Rash  . Penicillins Rash    Injection site reaction. Tolerated cefepime in past Has patient had a PCN reaction causing immediate rash, facial/tongue/throat swelling, SOB or lightheadedness with hypotension: No Has patient had a PCN reaction causing severe rash involving mucus membranes or skin necrosis: No Has patient had a PCN reaction that required hospitalization No Has patient had a PCN reaction occurring within the last 10 years: No If all of the above answers are "NO", then may proceed with  Cephalosporin use.   . Shrimp [Shellfish Allergy] Anaphylaxis    Per patient "shrimp only"  . Sulfa Antibiotics Nausea And Vomiting  . Azactam [Aztreonam]     Hand swelling   . Azelastine Hcl     Rash   . Ciprofloxacin Other (See Comments)    dizziness  . Claritin [Loratadine]     Irritability Nervousness   . Methotrexate Derivatives   . Nasacort [Triamcinolone]     Dizzy   . Olopatadine Other (See Comments)    Pain and lethargy   . Sulfamethizole Other (See Comments)    unknown  . Zantac [Ranitidine Hcl] Other (See Comments)    unknown  . Claritin-D 12 Hour [Loratadine-Pseudoephedrine Er] Anxiety    Allergies as of 07/22/2020      Reactions   Adhesive [tape] Rash   Celebrex [celecoxib] Hives    Ciprofibrate Nausea Only   Cymbalta [duloxetine Hcl] Swelling   Gabitril [tiagabine] Swelling   Keflex [cephalexin] Nausea And Vomiting   Lyrica [pregabalin] Swelling   Neurontin [gabapentin] Swelling   Nexium [esomeprazole] Rash   Nsaids Rash   Penicillins Rash   Injection site reaction. Tolerated cefepime in past Has patient had a PCN reaction causing immediate rash, facial/tongue/throat swelling, SOB or lightheadedness with hypotension: No Has patient had a PCN reaction causing severe rash involving mucus membranes or skin necrosis: No Has patient had a PCN reaction that required hospitalization No Has patient had a PCN reaction occurring within the last 10 years: No If all of the above answers are "NO", then may proceed with Cephalosporin use.   Shrimp [shellfish Allergy] Anaphylaxis   Per patient "shrimp only"   Sulfa Antibiotics Nausea And Vomiting   Azactam [aztreonam]    Hand swelling    Azelastine Hcl    Rash    Ciprofloxacin Other (See Comments)   dizziness   Claritin [loratadine]    Irritability Nervousness    Methotrexate Derivatives    Nasacort [triamcinolone]    Dizzy    Olopatadine Other (See Comments)   Pain and lethargy    Sulfamethizole Other (See Comments)   unknown   Zantac [ranitidine Hcl] Other (See Comments)   unknown   Claritin-d 12 Hour [loratadine-pseudoephedrine Er] Anxiety      Medication List       Accurate as of July 22, 2020  5:05 PM. If you have any questions, ask your nurse or doctor.        acetaminophen 500 MG tablet Commonly known as: TYLENOL Take 500 mg by mouth every 6 (six) hours.   atorvastatin 20 MG tablet Commonly known as: LIPITOR Take 1 tablet (20 mg total) by mouth daily.   Biotin Maximum Strength 10 MG Tabs Generic drug: Biotin Take 10 mg by mouth daily.   CRANBERRY PO Take 1 capsule by mouth daily.   cyanocobalamin 1000 MCG/ML injection Commonly known as: (VITAMIN B-12) 1000 mcg injection once per month.  What changed:   how much to take  how to take this  when to take this   cycloSPORINE 0.05 % ophthalmic emulsion Commonly known as: RESTASIS Place 1 drop into both eyes 2 (two) times daily.   diclofenac Sodium 1 % Gel Commonly known as: VOLTAREN APPLY 2 TO 4 GRAMS TOPICALLY TO AFFECTED JOINTS UP TO 4 TIMES DAILY What changed: See the new instructions.   EPINEPHrine 0.3 mg/0.3 mL Soaj injection Commonly known as: EPI-PEN Inject 0.3 mg into the muscle as needed for anaphylaxis.   fexofenadine 180 MG  tablet Commonly known as: ALLEGRA Take 180 mg by mouth daily.   fluticasone 50 MCG/ACT nasal spray Commonly known as: FLONASE SPRAY 2 SPRAYS INTO EACH NOSTRIL EVERY DAY   furosemide 80 MG tablet Commonly known as: LASIX Take 1 tablet (80 mg total) by mouth daily.   ketoconazole 2 % shampoo Commonly known as: NIZORAL Apply 1 application topically once a week.   lansoprazole 30 MG capsule Commonly known as: PREVACID Take one capsule by mouth once daily at 12 noon.   MELATONIN PO Take 10 mg by mouth daily.   methocarbamol 500 MG tablet Commonly known as: Robaxin Take 1 tablet (500 mg total) by mouth every 8 (eight) hours as needed for muscle spasms.   montelukast 10 MG tablet Commonly known as: SINGULAIR Take 1 tablet (10 mg total) by mouth at bedtime.   potassium chloride 10 MEQ tablet Commonly known as: KLOR-CON Take 2 tablets (20 mEq total) by mouth 2 (two) times daily. What changed:   how much to take  when to take this   predniSONE 10 MG tablet Commonly known as: DELTASONE TAKE 1 TABLET (10 MG TOTAL) BY MOUTH DAILY WITH BREAKFAST.   SYRINGE 3CC/25GX1" 25G X 1" 3 ML Misc 1 application by Does not apply route every 30 (thirty) days.   traMADol 50 MG tablet Commonly known as: ULTRAM TAKE 1 TABLET BY MOUTH 4 TIMES A DAY   Vitamin D3 125 MCG (5000 UT) Caps Take 5,000 Units by mouth daily.       Review of Systems:  Review of Systems   Constitutional: Negative for appetite change, fatigue and fever.  HENT: Negative for congestion.   Eyes: Negative for visual disturbance.  Respiratory: Negative for cough, shortness of breath and wheezing.   Cardiovascular: Negative for leg swelling.  Gastrointestinal: Negative for abdominal pain, constipation, nausea and vomiting.  Genitourinary: Positive for dysuria. Negative for difficulty urinating and urgency.       Odorous urine.   Musculoskeletal: Positive for arthralgias, back pain, gait problem and myalgias.  Skin: Negative for color change.  Neurological: Negative for dizziness, speech difficulty, weakness and headaches.  Psychiatric/Behavioral: Negative for behavioral problems, confusion and sleep disturbance. The patient is not nervous/anxious.     Health Maintenance  Topic Date Due  . TETANUS/TDAP  Never done  . INFLUENZA VACCINE  07/11/2020  . DEXA SCAN  Completed  . COVID-19 Vaccine  Completed  . PNA vac Low Risk Adult  Completed    Physical Exam: There were no vitals filed for this visit. There is no height or weight on file to calculate BMI. Physical Exam Vitals and nursing note reviewed.  Constitutional:      Appearance: Normal appearance.  HENT:     Head: Normocephalic and atraumatic.     Nose: Nose normal.     Mouth/Throat:     Mouth: Mucous membranes are moist.  Eyes:     Extraocular Movements: Extraocular movements intact.     Conjunctiva/sclera: Conjunctivae normal.     Pupils: Pupils are equal, round, and reactive to light.  Neck:     Comments: C/o left upper back/neck pain, better with warm compress, will work with therapy Cardiovascular:     Rate and Rhythm: Normal rate and regular rhythm.     Heart sounds: No murmur heard.   Pulmonary:     Breath sounds: Normal breath sounds. No wheezing or rhonchi.  Abdominal:     General: Bowel sounds are normal. There is no distension.  Palpations: Abdomen is soft.     Tenderness: There is no  abdominal tenderness. There is no right CVA tenderness, left CVA tenderness, guarding or rebound.  Musculoskeletal:     Cervical back: Normal range of motion and neck supple. No rigidity or tenderness.     Right lower leg: No edema.     Left lower leg: No edema.  Skin:    General: Skin is warm and dry.  Neurological:     General: No focal deficit present.     Mental Status: She is alert and oriented to person, place, and time. Mental status is at baseline.     Motor: No weakness.     Coordination: Coordination normal.     Gait: Gait abnormal.  Psychiatric:        Mood and Affect: Mood normal.        Behavior: Behavior normal.        Thought Content: Thought content normal.        Judgment: Judgment normal.     Labs reviewed: Basic Metabolic Panel: Recent Labs    08/21/19 1520 08/21/19 1520 04/06/20 0730 05/13/20 1123 06/28/20 1813 07/13/20 0710 07/19/20 1438  NA 140   < > 142   < > 143 141 143  K 3.9   < > 4.0   < > 4.4 4.3 3.6  CL 101   < > 103   < > 105 100 104  CO2 28   < > 30   < > _0 GLUCOSE 165*   < > 91   < > 124* 87 76  BUN 27*   < > 26*   < > _1 CREATININE 1.16  --  1.05*   < > 0.63 1.14* 1.01*  CALCIUM 10.1   < > 10.3   < > 9.4 10.1 10.2  MG 1.8  --   --   --   --   --   --   TSH 0.59  --  3.16  --   --   --   --    < > = values in this interval not displayed.   Liver Function Tests: Recent Labs    08/21/19 1520 04/06/20 0730 06/23/20 1323 06/23/20 1323 06/25/20 0941 07/13/20 0710 07/19/20 1438  AST 17   < > 18   < > _2 ALT 17   < > 15   < > _3 ALKPHOS 45  --  49  --  43  --   --   BILITOT 0.5   < > 1.2   < > 1.1 0.5 0.5  PROT 6.7   < > 7.3   < > 6.3* 7.5 7.1  ALBUMIN 4.0  --  3.3*  --  2.7*  --   --    < > = values in this interval not displayed.   Recent Labs    06/23/20 1323 07/19/20 1438  LIPASE 21 23   No results for input(s): AMMONIA in the last 8760 hours. CBC: Recent Labs    06/25/20 0849 06/26/20  0518 06/28/20 0542 07/13/20 0710 07/19/20 1438  WBC 10.9*   < > 9.4 13.7* 10.5  NEUTROABS 7.8*  --   --  8,234* 5,523  HGB 11.5*   < > 12.2 13.9 13.2  HCT 37.1   < > 38.5 42.0 40.5  MCV 102.2*   < > 98.7 95.0 96.4  PLT 256   < >  278 354 278   < > = values in this interval not displayed.   Lipid Panel: Recent Labs    04/06/20 0730  CHOL 176  HDL 67  LDLCALC 82  TRIG 171*  CHOLHDL 2.6   No results found for: HGBA1C  Procedures since last visit: CT ABDOMEN PELVIS W CONTRAST  Result Date: 06/23/2020 CLINICAL DATA:  Weakness, fever, dehydration EXAM: CT ABDOMEN AND PELVIS WITH CONTRAST TECHNIQUE: Multidetector CT imaging of the abdomen and pelvis was performed using the standard protocol following bolus administration of intravenous contrast. CONTRAST:  122m OMNIPAQUE IOHEXOL 300 MG/ML  SOLN COMPARISON:  02/03/2018 FINDINGS: Lower chest: Stable subpleural scarring, with scattered tree in bud opacities at the bases compatible with chronic indolent infection such as MAC. Otherwise no acute pleural or parenchymal lung disease. Hepatobiliary: No focal liver abnormality is seen. No gallstones, gallbladder wall thickening, or biliary dilatation. Pancreas: Unremarkable. No pancreatic ductal dilatation or surrounding inflammatory changes. Spleen: Normal in size without focal abnormality. Adrenals/Urinary Tract: Adrenal glands are unremarkable. Kidneys are normal, without renal calculi, focal lesion, or hydronephrosis. Bladder is unremarkable. Stomach/Bowel: No bowel obstruction or ileus. There is wall thickening of the sigmoid colon, with surrounding pericolonic fat stranding. There is free gas within the adjacent mesentery, consistent with perforated sigmoid diverticulitis. There is no fluid collection or abscess. Normal appendix is seen in the right mid abdomen. Vascular/Lymphatic: Aortic atherosclerosis. No enlarged abdominal or pelvic lymph nodes. Reproductive: Uterus and right adnexa are  unremarkable. There is a 3.4 cm simple appearing cyst within the left ovary, previous having measured 2.9 cm. Other: No free fluid. Free gas in the left lower quadrant adjacent to the sigmoid diverticulitis. No abdominal wall hernia. Musculoskeletal: No acute or destructive bony lesions. Severe osteoarthritis within the right hip. Postsurgical changes lower lumbar spine. Reconstructed images demonstrate no additional findings. IMPRESSION: 1. Acute perforated sigmoid diverticulitis, with pneumoperitoneum in the left lower quadrant. No fluid collection or abscess. 2. A 3.4 cm simple appearing left ovarian cyst, previous having measured 2.9 cm. Pelvic ultrasound recommended if not previously performed. This recommendation follows ACR consensus guidelines: White Paper of the ACR Incidental Findings Committee II on Adnexal Findings. J Am Coll Radiol 2013:10:675-681. 3. Aortic Atherosclerosis (ICD10-I70.0). These results were called by telephone at the time of interpretation on 06/23/2020 at 3:26 pm to provider KEmeterio Reeve, who verbally acknowledged these results. Electronically Signed   By: MRanda NgoM.D.   On: 06/23/2020 15:33   DG Chest Portable 1 View  Result Date: 06/23/2020 CLINICAL DATA:  Nausea and weakness. EXAM: PORTABLE CHEST 1 VIEW COMPARISON:  06/09/1999 FINDINGS: Hypoventilation. Decreased lung volume with bibasilar atelectasis left greater than right. Negative for heart failure. No pleural effusion. ACDF cervical spine. IMPRESSION: Hypoventilation with bibasilar atelectasis. Electronically Signed   By: CFranchot GalloM.D.   On: 06/23/2020 13:56    Assessment/Plan  Dysuria Odorous urine, dysuria x 2 days, denied urinary frequency/urgency, she is afebrile, denied nausea, vomiting, or suprapubic tenderness. Obtain UA C/S to r/o UTI   Perforation of sigmoid colon due to diverticulitis Hospitalized 06/23/20-06/29/20 for abd pain, CT abd/pelvis acute sigmoid diverticulitis with  pneumoperitoneum, general surgery recommended conservative tx with ABT Omnicef/Falgyl , f/u Dr. KKieth Brightly colonoscopy advised 4-6 wks. No further diarrhea since 4 days ago.    Allergic rhinitis Asthma Epinephrine prn, Allegra, Flonase, Singulair   Rheumatoid arthritis (HCC) Controlled, continue Prednisone, Tramadol  GERD (gastroesophageal reflux disease) Stable, continue Prevacid.   Edema of both lower extremities due to  peripheral venous insufficiency Minimal edema BLE, continue Furosemide.    Labs/tests ordered:  UA C/S, TSH   Next appt:  07/29/2020

## 2020-07-22 NOTE — Assessment & Plan Note (Signed)
Odorous urine, dysuria x 2 days, denied urinary frequency/urgency, she is afebrile, denied nausea, vomiting, or suprapubic tenderness. Obtain UA C/S to r/o UTI

## 2020-07-22 NOTE — Assessment & Plan Note (Signed)
Stable, continue Prevacid.

## 2020-07-22 NOTE — Assessment & Plan Note (Signed)
Asthma Epinephrine prn, Allegra, Flonase, Singulair 

## 2020-07-22 NOTE — Assessment & Plan Note (Signed)
Controlled, continue Prednisone, Tramadol

## 2020-07-22 NOTE — Assessment & Plan Note (Signed)
Minimal edema BLE, continue Furosemide.  

## 2020-07-22 NOTE — Assessment & Plan Note (Signed)
Hospitalized 06/23/20-06/29/20 for abd pain, CT abd/pelvis acute sigmoid diverticulitis with pneumoperitoneum, general surgery recommended conservative tx with ABT Omnicef/Falgyl , f/u Dr. [name], colonoscopy advised 4-6 wks. No further diarrhea since 4 days ago.

## 2020-07-23 LAB — COMPLETE METABOLIC PANEL WITH GFR
AG Ratio: 1.4 (calc) (ref 1.0–2.5)
ALT: 11 U/L (ref 6–29)
AST: 15 U/L (ref 10–35)
Albumin: 4.1 g/dL (ref 3.6–5.1)
Alkaline phosphatase (APISO): 46 U/L (ref 37–153)
BUN/Creatinine Ratio: 22 (calc) (ref 6–22)
BUN: 22 mg/dL (ref 7–25)
CO2: 30 mmol/L (ref 20–32)
Calcium: 10.2 mg/dL (ref 8.6–10.4)
Chloride: 104 mmol/L (ref 98–110)
Creat: 1.01 mg/dL — ABNORMAL HIGH (ref 0.60–0.88)
GFR, Est African American: 59 mL/min/{1.73_m2} — ABNORMAL LOW (ref >=60)
GFR, Est Non African American: 51 mL/min/{1.73_m2} — ABNORMAL LOW (ref >=60)
Globulin: 3 g/dL (calc) (ref 1.9–3.7)
Glucose, Bld: 76 mg/dL (ref 65–99)
Potassium: 3.6 mmol/L (ref 3.5–5.3)
Sodium: 143 mmol/L (ref 135–146)
Total Bilirubin: 0.5 mg/dL (ref 0.2–1.2)
Total Protein: 7.1 g/dL (ref 6.1–8.1)

## 2020-07-23 LAB — CBC WITH DIFFERENTIAL/PLATELET
Absolute Monocytes: 945 cells/uL (ref 200–950)
Basophils Absolute: 84 cells/uL (ref 0–200)
Basophils Relative: 0.8 %
Eosinophils Absolute: 252 cells/uL (ref 15–500)
Eosinophils Relative: 2.4 %
HCT: 40.5 % (ref 35.0–45.0)
Hemoglobin: 13.2 g/dL (ref 11.7–15.5)
Lymphs Abs: 3696 cells/uL (ref 850–3900)
MCH: 31.4 pg (ref 27.0–33.0)
MCHC: 32.6 g/dL (ref 32.0–36.0)
MCV: 96.4 fL (ref 80.0–100.0)
MPV: 10.2 fL (ref 7.5–12.5)
Monocytes Relative: 9 %
Neutro Abs: 5523 cells/uL (ref 1500–7800)
Neutrophils Relative %: 52.6 %
Platelets: 278 10*3/uL (ref 140–400)
RBC: 4.2 10*6/uL (ref 3.80–5.10)
RDW: 13 % (ref 11.0–15.0)
Total Lymphocyte: 35.2 %
WBC: 10.5 10*3/uL (ref 3.8–10.8)

## 2020-07-23 LAB — FECAL LEUKOCYTES

## 2020-07-23 LAB — GASTROINTESTINAL PATHOGEN PANEL PCR

## 2020-07-23 LAB — LIPASE: Lipase: 23 U/L (ref 7–60)

## 2020-07-23 LAB — CLOSTRIDIUM DIFFICILE TOXIN B, QUALITATIVE, REAL-TIME PCR

## 2020-07-24 LAB — URINE CULTURE

## 2020-07-24 LAB — HOUSE ACCOUNT TRACKING

## 2020-07-26 DIAGNOSIS — K5700 Diverticulitis of small intestine with perforation and abscess without bleeding: Secondary | ICD-10-CM | POA: Diagnosis not present

## 2020-07-26 DIAGNOSIS — R2681 Unsteadiness on feet: Secondary | ICD-10-CM | POA: Diagnosis not present

## 2020-07-26 DIAGNOSIS — M542 Cervicalgia: Secondary | ICD-10-CM | POA: Diagnosis not present

## 2020-07-26 DIAGNOSIS — M6281 Muscle weakness (generalized): Secondary | ICD-10-CM | POA: Diagnosis not present

## 2020-07-27 ENCOUNTER — Telehealth: Payer: Self-pay

## 2020-07-27 ENCOUNTER — Other Ambulatory Visit: Payer: Medicare PPO

## 2020-07-27 ENCOUNTER — Other Ambulatory Visit: Payer: Self-pay

## 2020-07-27 DIAGNOSIS — R8290 Unspecified abnormal findings in urine: Secondary | ICD-10-CM | POA: Diagnosis not present

## 2020-07-27 DIAGNOSIS — L659 Nonscarring hair loss, unspecified: Secondary | ICD-10-CM | POA: Diagnosis not present

## 2020-07-27 NOTE — Telephone Encounter (Signed)
Called [facility] to get update on urine culture.   [place] states urine culture could not be ran because it wasn't received in gray top. Discussed with Regenia Skeeter I was told it could be sent in cup as long as it was called in STAT. Pick up WAS called in stat conf # [id number]. [place] states if in cup it must be less than 3 mls and specimen was more than 3 mls.   Called patient to see if she would like to come to office to have it processed before her upcoming visit.   Patient agreed to come into office today to have labs processed because they were also unable to run her TSH this morning because they were unable to access order.

## 2020-07-28 DIAGNOSIS — K5792 Diverticulitis of intestine, part unspecified, without perforation or abscess without bleeding: Secondary | ICD-10-CM | POA: Diagnosis not present

## 2020-07-29 ENCOUNTER — Other Ambulatory Visit: Payer: Self-pay

## 2020-07-29 ENCOUNTER — Non-Acute Institutional Stay: Payer: Medicare PPO | Admitting: Nurse Practitioner

## 2020-07-29 ENCOUNTER — Encounter: Payer: Self-pay | Admitting: Nurse Practitioner

## 2020-07-29 ENCOUNTER — Other Ambulatory Visit: Payer: Self-pay | Admitting: General Surgery

## 2020-07-29 DIAGNOSIS — R2681 Unsteadiness on feet: Secondary | ICD-10-CM | POA: Diagnosis not present

## 2020-07-29 DIAGNOSIS — K5720 Diverticulitis of large intestine with perforation and abscess without bleeding: Secondary | ICD-10-CM

## 2020-07-29 DIAGNOSIS — R300 Dysuria: Secondary | ICD-10-CM

## 2020-07-29 DIAGNOSIS — I872 Venous insufficiency (chronic) (peripheral): Secondary | ICD-10-CM | POA: Diagnosis not present

## 2020-07-29 DIAGNOSIS — K5792 Diverticulitis of intestine, part unspecified, without perforation or abscess without bleeding: Secondary | ICD-10-CM

## 2020-07-29 DIAGNOSIS — K219 Gastro-esophageal reflux disease without esophagitis: Secondary | ICD-10-CM

## 2020-07-29 DIAGNOSIS — K5700 Diverticulitis of small intestine with perforation and abscess without bleeding: Secondary | ICD-10-CM | POA: Diagnosis not present

## 2020-07-29 DIAGNOSIS — M542 Cervicalgia: Secondary | ICD-10-CM | POA: Diagnosis not present

## 2020-07-29 DIAGNOSIS — M0579 Rheumatoid arthritis with rheumatoid factor of multiple sites without organ or systems involvement: Secondary | ICD-10-CM | POA: Diagnosis not present

## 2020-07-29 DIAGNOSIS — J309 Allergic rhinitis, unspecified: Secondary | ICD-10-CM

## 2020-07-29 DIAGNOSIS — N390 Urinary tract infection, site not specified: Secondary | ICD-10-CM | POA: Diagnosis not present

## 2020-07-29 DIAGNOSIS — M6281 Muscle weakness (generalized): Secondary | ICD-10-CM | POA: Diagnosis not present

## 2020-07-29 LAB — URINE CULTURE
MICRO NUMBER:: 10837973
SPECIMEN QUALITY:: ADEQUATE

## 2020-07-29 LAB — TSH: TSH: 2.04 mIU/L (ref 0.40–4.50)

## 2020-07-29 NOTE — Assessment & Plan Note (Addendum)
Pending UA C/S.HR in 100s, denied SOB, dizziness, chest pain/pressure, or palpitation, UTI is contributory? Observe.  07/29/20 UA C/S Citrobacter Braakii >100,000c/ml, 07/30/20 Nitrofurantoin 100mg  bid x 7 days.

## 2020-07-29 NOTE — Assessment & Plan Note (Signed)
Stable, continue Prevacid.

## 2020-07-29 NOTE — Assessment & Plan Note (Signed)
Stable, continue Prednisone, Tramadol

## 2020-07-29 NOTE — Assessment & Plan Note (Addendum)
Venous insufficiency/Edema , takes Furosemide 32m qd, Kcl 07/19/20 Wbc 10.5, Hgb 13.2, plt 278, neutrophils 52.6%, Lipase 23(7-60), Na 143, K 3.6, Bun 22, creat 1.01, eGFR 51

## 2020-07-29 NOTE — Assessment & Plan Note (Signed)
Asthma Epinephrine prn, Allegra, Flonase, Singulair 

## 2020-07-29 NOTE — Assessment & Plan Note (Addendum)
Hospitalized 06/23/20-06/29/20 for abd pain, CT abd/pelvis acute sigmoid diverticulitis with pneumoperitoneum, general surgery recommended conservative tx with ABT Omnicef/Falgyl , f/u Dr. [name], colonoscopy advised 4-6 wks. No further diarrhea, but occasional loose stools.  Pending MRI abd, GI colonoscopy 08/2020

## 2020-07-29 NOTE — Progress Notes (Addendum)
Location:   clinic [facility]   Place of Service:  Clinic (12) Provider: Marlana Latus NP  Code Status: DNR Goals of Care: [place] 06/23/2020  Does Patient Have a Medical Advance Directive? No  Type of Advance Directive -  Does patient want to make changes to medical advance directive? -  Copy of [place] in Chart? -  Would patient like information on creating a medical advance directive? No - Patient declined     Chief Complaint  Patient presents with  . Medical Management of Chronic Issues    Patient returns to the clinic for follow up.   Marland Kitchen Health Maintenance    TDAP    HPI: Patient is a 86 y.o. female seen today for medical management of chronic diseases.      Odorous urine, dysuria,  denied urinary frequency/urgency, she is afebrile, denied nausea, vomiting, or suprapubic tenderness. pending UA C/S             Hospitalized 06/23/20-06/29/20 for abd pain, CT abd/pelvis acute sigmoid diverticulitis with pneumoperitoneum, general surgery recommended conservative tx with ABT Omnicef/Falgyl , f/u Dr. [name], colonoscopy advised 4-6 wks. No further diarrhea   Asthma Epinephrine prn, Allegra, Flonase, Singulair RA takes Prednisone, Tramadol GERD Prevacid 93m qd Venous insufficiency/Edema , takes Furosemide 835mqd, Kcl                Past Medical History:  Diagnosis Date  . Adrenal failure ([place]  . Arthritis   . Asthma   . Cancer ([place]  . Cataract   . Closed nondisplaced fracture of fifth right metatarsal bone 09/18/2017  . Fibromyalgia 2008  . HCAP (healthcare-associated pneumonia) 02/03/2018  . Osteoporosis   . RA (rheumatoid arthritis) ([place]  . Recurrent upper respiratory infection (URI)   . Sepsis due to urinary tract infection ([facility]  . Urticaria     Past Surgical History:  Procedure Laterality Date  . BACK SURGERY    . BILATERAL CARPAL TUNNEL RELEASE  2005   right and  left  . CATARACT EXTRACTION, BILATERAL  2004   right and left  . CERVICAL FUSION  2011,2010,2008   2 disks  . HEEL SPUR SURGERY  2004  . lower back fusion  2011   Fusion of 3-4 and 4-5 lower back  . [place]. ROTATOR CUFF REPAIR  [id number]. SQUAMOUS CELL CARCINOMA EXCISION    . TONSILLECTOMY AND ADENOIDECTOMY  1947  . TOTAL SHOULDER ARTHROPLASTY      Allergies  Allergen Reactions  . Adhesive [Tape] Rash  . Celebrex [Celecoxib] Hives  . Ciprofibrate Nausea Only  . Cymbalta [Duloxetine Hcl] Swelling  . Gabitril [Tiagabine] Swelling  . Keflex [Cephalexin] Nausea And Vomiting  . Lyrica [Pregabalin] Swelling  . Neurontin [Gabapentin] Swelling  . Nexium [Esomeprazole] Rash  . Nsaids Rash  . Penicillins Rash    Injection site reaction. Tolerated cefepime in past Has patient had a PCN reaction causing immediate rash, facial/tongue/throat swelling, SOB or lightheadedness with hypotension: No Has patient had a PCN reaction causing severe rash involving mucus membranes or skin necrosis: No Has patient had a PCN reaction that required hospitalization No Has patient had a PCN reaction occurring within the last 10 years: No If all of the above answers are "NO", then may proceed with Cephalosporin use.   . Shrimp [Shellfish Allergy] Anaphylaxis    Per patient "shrimp only"  . Sulfa Antibiotics Nausea And Vomiting  .  Azactam [Aztreonam]     Hand swelling   . Azelastine Hcl     Rash   . Ciprofloxacin Other (See Comments)    dizziness  . Claritin [Loratadine]     Irritability Nervousness   . Methotrexate Derivatives   . Nasacort [Triamcinolone]     Dizzy   . Olopatadine Other (See Comments)    Pain and lethargy   . Sulfamethizole Other (See Comments)    unknown  . Zantac [Ranitidine Hcl] Other (See Comments)    unknown  . Claritin-D 12 Hour [Loratadine-Pseudoephedrine Er] Anxiety    Allergies as of 07/29/2020      Reactions   Adhesive [tape] Rash    Celebrex [celecoxib] Hives   Ciprofibrate Nausea Only   Cymbalta [duloxetine Hcl] Swelling   Gabitril [tiagabine] Swelling   Keflex [cephalexin] Nausea And Vomiting   Lyrica [pregabalin] Swelling   Neurontin [gabapentin] Swelling   Nexium [esomeprazole] Rash   Nsaids Rash   Penicillins Rash   Injection site reaction. Tolerated cefepime in past Has patient had a PCN reaction causing immediate rash, facial/tongue/throat swelling, SOB or lightheadedness with hypotension: No Has patient had a PCN reaction causing severe rash involving mucus membranes or skin necrosis: No Has patient had a PCN reaction that required hospitalization No Has patient had a PCN reaction occurring within the last 10 years: No If all of the above answers are "NO", then may proceed with Cephalosporin use.   Shrimp [shellfish Allergy] Anaphylaxis   Per patient "shrimp only"   Sulfa Antibiotics Nausea And Vomiting   Azactam [aztreonam]    Hand swelling    Azelastine Hcl    Rash    Ciprofloxacin Other (See Comments)   dizziness   Claritin [loratadine]    Irritability Nervousness    Methotrexate Derivatives    Nasacort [triamcinolone]    Dizzy    Olopatadine Other (See Comments)   Pain and lethargy    Sulfamethizole Other (See Comments)   unknown   Zantac [ranitidine Hcl] Other (See Comments)   unknown   Claritin-d 12 Hour [loratadine-pseudoephedrine Er] Anxiety      Medication List       Accurate as of July 29, 2020 11:59 PM. If you have any questions, ask your nurse or doctor.        acetaminophen 500 MG tablet Commonly known as: TYLENOL Take 500 mg by mouth every 6 (six) hours.   atorvastatin 20 MG tablet Commonly known as: LIPITOR Take 1 tablet (20 mg total) by mouth daily.   Biotin Maximum Strength 10 MG Tabs Generic drug: Biotin Take 10 mg by mouth daily.   CRANBERRY PO Take 1 capsule by mouth daily.   cyanocobalamin 1000 MCG/ML injection Commonly known as: (VITAMIN B-12) 1000  mcg injection once per month. What changed:   how much to take  how to take this  when to take this   cycloSPORINE 0.05 % ophthalmic emulsion Commonly known as: RESTASIS Place 1 drop into both eyes 2 (two) times daily.   diclofenac Sodium 1 % Gel Commonly known as: VOLTAREN APPLY 2 TO 4 GRAMS TOPICALLY TO AFFECTED JOINTS UP TO 4 TIMES DAILY What changed: See the new instructions.   EPINEPHrine 0.3 mg/0.3 mL Soaj injection Commonly known as: EPI-PEN Inject 0.3 mg into the muscle as needed for anaphylaxis.   fexofenadine 180 MG tablet Commonly known as: ALLEGRA Take 180 mg by mouth daily.   fluticasone 50 MCG/ACT nasal spray Commonly known as: FLONASE SPRAY 2 SPRAYS INTO  EACH NOSTRIL EVERY DAY   furosemide 80 MG tablet Commonly known as: LASIX Take 1 tablet (80 mg total) by mouth daily.   ketoconazole 2 % shampoo Commonly known as: NIZORAL Apply 1 application topically once a week.   lansoprazole 30 MG capsule Commonly known as: PREVACID Take one capsule by mouth once daily at 12 noon.   MELATONIN PO Take 10 mg by mouth daily.   methocarbamol 500 MG tablet Commonly known as: Robaxin Take 1 tablet (500 mg total) by mouth every 8 (eight) hours as needed for muscle spasms.   montelukast 10 MG tablet Commonly known as: SINGULAIR Take 1 tablet (10 mg total) by mouth at bedtime.   potassium chloride 10 MEQ tablet Commonly known as: KLOR-CON Take 2 tablets (20 mEq total) by mouth 2 (two) times daily. What changed:   how much to take  when to take this   predniSONE 10 MG tablet Commonly known as: DELTASONE TAKE 1 TABLET (10 MG TOTAL) BY MOUTH DAILY WITH BREAKFAST.   SYRINGE 3CC/25GX1" 25G X 1" 3 ML Misc 1 application by Does not apply route every 30 (thirty) days.   traMADol 50 MG tablet Commonly known as: ULTRAM TAKE 1 TABLET BY MOUTH 4 TIMES A DAY   Vitamin D3 125 MCG (5000 UT) Caps Take 5,000 Units by mouth daily.       Review of Systems:   Review of Systems  Constitutional: Negative for appetite change, fatigue and fever.  HENT: Negative for congestion.   Eyes: Negative for visual disturbance.  Respiratory: Negative for cough, shortness of breath and wheezing.   Cardiovascular: Negative for leg swelling.  Gastrointestinal: Negative for abdominal pain, constipation, nausea and vomiting.  Genitourinary: Positive for dysuria. Negative for difficulty urinating and urgency.       Odorous urine.   Musculoskeletal: Positive for arthralgias, back pain, gait problem and myalgias.  Skin: Negative for color change.  Neurological: Negative for speech difficulty, weakness and light-headedness.  Psychiatric/Behavioral: Negative for behavioral problems, confusion and sleep disturbance. The patient is not nervous/anxious.     Health Maintenance  Topic Date Due  . TETANUS/TDAP  Never done  . INFLUENZA VACCINE  07/11/2020  . DEXA SCAN  Completed  . COVID-19 Vaccine  Completed  . PNA vac Low Risk Adult  Completed    Physical Exam: Vitals:   07/29/20 1410  BP: 128/76  Pulse: (!) 103  Temp: (!) 96.4 F (35.8 C)  SpO2: 95%  Weight: 164 lb 9.6 oz (74.7 kg)  Height: '5\' 4"'  (1.626 m)   Body mass index is 28.25 kg/m. Physical Exam Vitals and nursing note reviewed.  Constitutional:      Appearance: Normal appearance.  HENT:     Head: Normocephalic and atraumatic.     Mouth/Throat:     Mouth: Mucous membranes are moist.  Eyes:     Extraocular Movements: Extraocular movements intact.     Conjunctiva/sclera: Conjunctivae normal.     Pupils: Pupils are equal, round, and reactive to light.  Neck:     Comments: C/o left upper back/neck pain, better with warm compress, will work with therapy Cardiovascular:     Rate and Rhythm: Normal rate and regular rhythm.     Heart sounds: No murmur heard.   Pulmonary:     Breath sounds: Normal breath sounds. No rales.  Abdominal:     General: Bowel sounds are normal. There is no  distension.     Palpations: Abdomen is soft.  Tenderness: There is no abdominal tenderness. There is no right CVA tenderness, left CVA tenderness, guarding or rebound.  Musculoskeletal:     Cervical back: Normal range of motion and neck supple.     Right lower leg: No edema.     Left lower leg: No edema.  Skin:    General: Skin is warm and dry.  Neurological:     General: No focal deficit present.     Mental Status: She is alert and oriented to person, place, and time. Mental status is at baseline.     Motor: No weakness.     Coordination: Coordination normal.     Gait: Gait abnormal.  Psychiatric:        Mood and Affect: Mood normal.        Behavior: Behavior normal.        Thought Content: Thought content normal.        Judgment: Judgment normal.     Labs reviewed: Basic Metabolic Panel: Recent Labs    08/21/19 1520 08/21/19 1520 04/06/20 0730 05/13/20 1123 06/28/20 1813 07/13/20 0710 07/19/20 1438 07/27/20 1107  NA 140   < > 142   < > 143 141 143  --   K 3.9   < > 4.0   < > 4.4 4.3 3.6  --   CL 101   < > 103   < > 105 100 104  --   CO2 28   < > 30   < > '27 27 30  ' --   GLUCOSE 165*   < > 91   < > 124* 87 76  --   BUN 27*   < > 26*   < > '9 23 22  ' --   CREATININE 1.16  --  1.05*   < > 0.63 1.14* 1.01*  --   CALCIUM 10.1   < > 10.3   < > 9.4 10.1 10.2  --   MG 1.8  --   --   --   --   --   --   --   TSH 0.59  --  3.16  --   --   --   --  2.04   < > = values in this interval not displayed.   Liver Function Tests: Recent Labs    08/21/19 1520 04/06/20 0730 06/23/20 1323 06/23/20 1323 06/25/20 0941 07/13/20 0710 07/19/20 1438  AST 17   < > 18   < > '20 19 15  ' ALT 17   < > 15   < > '14 13 11  ' ALKPHOS 45  --  49  --  43  --   --   BILITOT 0.5   < > 1.2   < > 1.1 0.5 0.5  PROT 6.7   < > 7.3   < > 6.3* 7.5 7.1  ALBUMIN 4.0  --  3.3*  --  2.7*  --   --    < > = values in this interval not displayed.   Recent Labs    06/23/20 1323 07/19/20 1438  LIPASE 21  23   No results for input(s): AMMONIA in the last 8760 hours. CBC: Recent Labs    06/25/20 0849 06/26/20 0518 06/28/20 0542 07/13/20 0710 07/19/20 1438  WBC 10.9*   < > 9.4 13.7* 10.5  NEUTROABS 7.8*  --   --  8,234* 5,523  HGB 11.5*   < > 12.2 13.9 13.2  HCT 37.1   < >  38.5 42.0 40.5  MCV 102.2*   < > 98.7 95.0 96.4  PLT 256   < > 278 354 278   < > = values in this interval not displayed.   Lipid Panel: Recent Labs    04/06/20 0730  CHOL 176  HDL 67  LDLCALC 82  TRIG 171*  CHOLHDL 2.6   No results found for: HGBA1C  Procedures since last visit: No results found.  Assessment/Plan  UTI (urinary tract infection) Pending UA C/S.HR in 100s, denied SOB, dizziness, chest pain/pressure, or palpitation, UTI is contributory? Observe.  07/29/20 UA C/S Citrobacter Braakii >100,000c/ml, 07/30/20 Nitrofurantoin 163m bid x 7 days.   Perforation of sigmoid colon due to diverticulitis Hospitalized 06/23/20-06/29/20 for abd pain, CT abd/pelvis acute sigmoid diverticulitis with pneumoperitoneum, general surgery recommended conservative tx with ABT Omnicef/Falgyl , f/u Dr. KKieth Brightly colonoscopy advised 4-6 wks. No further diarrhea, but occasional loose stools.  Pending MRI abd, GI colonoscopy 08/2020   Allergic rhinitis Asthma Epinephrine prn, Allegra, Flonase, Singulair   Rheumatoid arthritis (HCC) Stable, continue Prednisone, Tramadol  GERD (gastroesophageal reflux disease) Stable, continue Prevacid.   Edema of both lower extremities due to peripheral venous insufficiency Venous insufficiency/Edema , takes Furosemide 892mqd, Kcl 07/19/20 Wbc 10.5, Hgb 13.2, plt 278, neutrophils 52.6%, Lipase 23(7-60), Na 143, K 3.6, Bun 22, creat 1.01, eGFR 51                 Labs/tests ordered:  Pending UA C/S  Next appt:  10/15/2020

## 2020-07-30 ENCOUNTER — Encounter: Payer: Self-pay | Admitting: Nurse Practitioner

## 2020-07-30 ENCOUNTER — Telehealth: Payer: Self-pay

## 2020-07-30 ENCOUNTER — Other Ambulatory Visit: Payer: Self-pay | Admitting: Nurse Practitioner

## 2020-07-30 DIAGNOSIS — N390 Urinary tract infection, site not specified: Secondary | ICD-10-CM

## 2020-07-30 MED ORDER — SACCHAROMYCES BOULARDII 250 MG PO CAPS: 250.0000 mg | ORAL_CAPSULE | Freq: Two times a day (BID) | ORAL | Status: AC

## 2020-07-30 MED ORDER — NITROFURANTOIN MONOHYD MACRO 100 MG PO CAPS: 100.0000 mg | ORAL_CAPSULE | Freq: Two times a day (BID) | ORAL | 0 refills | Status: AC

## 2020-07-30 NOTE — Telephone Encounter (Signed)
DWP, Rx sent to pharmacy. She states she did not collect stool sample because she believes her WBCs were elevated due to this UTI. She will also pick up the probiotic and has no further questions.

## 2020-07-30 NOTE — Telephone Encounter (Signed)
-----   Message from [name] Mast, NP sent at 07/30/2020  3:22 PM EDT ----- Please inform the patient her urine culture showed UTI, susceptible to Nitrofurantoin, will treat with Nitrofurantoin 100mg  po bid x 7days. Florastor I bid po x 7 days.

## 2020-08-02 DIAGNOSIS — R2681 Unsteadiness on feet: Secondary | ICD-10-CM | POA: Diagnosis not present

## 2020-08-02 DIAGNOSIS — M542 Cervicalgia: Secondary | ICD-10-CM | POA: Diagnosis not present

## 2020-08-02 DIAGNOSIS — K5700 Diverticulitis of small intestine with perforation and abscess without bleeding: Secondary | ICD-10-CM | POA: Diagnosis not present

## 2020-08-02 DIAGNOSIS — M6281 Muscle weakness (generalized): Secondary | ICD-10-CM | POA: Diagnosis not present

## 2020-08-04 ENCOUNTER — Other Ambulatory Visit: Payer: Self-pay | Admitting: Nurse Practitioner

## 2020-08-04 DIAGNOSIS — M797 Fibromyalgia: Secondary | ICD-10-CM

## 2020-08-04 DIAGNOSIS — M47816 Spondylosis without myelopathy or radiculopathy, lumbar region: Secondary | ICD-10-CM

## 2020-08-04 DIAGNOSIS — M0579 Rheumatoid arthritis with rheumatoid factor of multiple sites without organ or systems involvement: Secondary | ICD-10-CM

## 2020-08-05 NOTE — Telephone Encounter (Signed)
Pharmacy sent refill request Epic LR: 07/16/2020 Contract on file Pended Rx and sent to Dr. Lyndel Safe for approval.

## 2020-08-09 DIAGNOSIS — M6281 Muscle weakness (generalized): Secondary | ICD-10-CM | POA: Diagnosis not present

## 2020-08-09 DIAGNOSIS — R2681 Unsteadiness on feet: Secondary | ICD-10-CM | POA: Diagnosis not present

## 2020-08-09 DIAGNOSIS — K5700 Diverticulitis of small intestine with perforation and abscess without bleeding: Secondary | ICD-10-CM | POA: Diagnosis not present

## 2020-08-09 DIAGNOSIS — M542 Cervicalgia: Secondary | ICD-10-CM | POA: Diagnosis not present

## 2020-08-10 ENCOUNTER — Other Ambulatory Visit: Payer: Self-pay

## 2020-08-10 ENCOUNTER — Encounter: Payer: Self-pay | Admitting: Family

## 2020-08-10 ENCOUNTER — Ambulatory Visit (INDEPENDENT_AMBULATORY_CARE_PROVIDER_SITE_OTHER): Payer: Medicare PPO | Admitting: Family

## 2020-08-10 VITALS — BP 128/80 | HR 92 | Temp 97.5°F | Resp 16 | Ht 64.0 in | Wt 164.4 lb

## 2020-08-10 DIAGNOSIS — R490 Dysphonia: Secondary | ICD-10-CM

## 2020-08-10 DIAGNOSIS — R1319 Other dysphagia: Secondary | ICD-10-CM

## 2020-08-10 NOTE — Progress Notes (Signed)
Provider: Venesa Semidey FNP-C  [name], MD  Patient Care Team: [name], MD as PCP - General (Internal Medicine)  Extended Emergency Contact Information Primary Emergency Contact: Debrew,Jacqueline Dr.  Johnnette Litter of [place] Phone: [phone] Mobile Phone: [phone] Relation: Daughter  Code Status: Full Code  Goals of care: Advanced Directive information Advanced Directives 08/10/2020  Does Patient Have a Medical Advance Directive? Yes  Type of Advance Directive [place]  Does patient want to make changes to medical advance directive? No - Patient declined  Copy of [place] in Chart? Yes - validated most recent copy scanned in chart (See row information)  Would patient like information on creating a medical advance directive? -     Chief Complaint  Patient presents with  . Acute Visit    Losing her voice and feeling like something is stuck in her throat x 3 weeks.     HPI:  Pt is a 86 y.o. female seen today for an acute visit for evaluation of x 3 weeks ago a pill got stuck in the throat since then it's been difficult to swallow and sore.she denies any coughing with eating or drinking water.she states small pills tends to be stuck more often. She denies any fever,chills or URI's.Also denies any symptoms of stroke or difficulties breathing.     Past Medical History:  Diagnosis Date  . Adrenal failure ([place])   . Arthritis   . Asthma   . Cancer ([place])   . Cataract   . Closed nondisplaced fracture of fifth right metatarsal bone 09/18/2017  . Fibromyalgia 2008  . HCAP (healthcare-associated pneumonia) 02/03/2018  . Osteoporosis   . RA (rheumatoid arthritis) ([place])   . Recurrent upper respiratory infection (URI)   . Sepsis due to urinary tract infection ([place]) 01/18/2018  . Urticaria    Past Surgical History:  Procedure Laterality Date  . BACK SURGERY    . BILATERAL CARPAL TUNNEL RELEASE  2005   right and  left  . CATARACT EXTRACTION, BILATERAL  2004   right and left  . CERVICAL FUSION  2011,2010,2008   2 disks  . HEEL SPUR SURGERY  2004  . lower back fusion  2011   Fusion of 3-4 and 4-5 lower back  . [place]  . ROTATOR CUFF REPAIR  [id number]  . SQUAMOUS CELL CARCINOMA EXCISION    . TONSILLECTOMY AND ADENOIDECTOMY  1947  . TOTAL SHOULDER ARTHROPLASTY      Allergies  Allergen Reactions  . Adhesive [Tape] Rash  . Celebrex [Celecoxib] Hives  . Ciprofibrate Nausea Only  . Cymbalta [Duloxetine Hcl] Swelling  . Gabitril [Tiagabine] Swelling  . Keflex [Cephalexin] Nausea And Vomiting  . Lyrica [Pregabalin] Swelling  . Neurontin [Gabapentin] Swelling  . Nexium [Esomeprazole] Rash  . Nsaids Rash  . Penicillins Rash    Injection site reaction. Tolerated cefepime in past Has patient had a PCN reaction causing immediate rash, facial/tongue/throat swelling, SOB or lightheadedness with hypotension: No Has patient had a PCN reaction causing severe rash involving mucus membranes or skin necrosis: No Has patient had a PCN reaction that required hospitalization No Has patient had a PCN reaction occurring within the last 10 years: No If all of the above answers are "NO", then may proceed with Cephalosporin use.   . Shrimp [Shellfish Allergy] Anaphylaxis    Per patient "shrimp only"  . Sulfa Antibiotics Nausea And Vomiting  . Azactam [Aztreonam]     Hand  swelling   . Azelastine Hcl     Rash   . Ciprofloxacin Other (See Comments)    dizziness  . Claritin [Loratadine]     Irritability Nervousness   . Methotrexate Derivatives   . Nasacort [Triamcinolone]     Dizzy   . Olopatadine Other (See Comments)    Pain and lethargy   . Sulfamethizole Other (See Comments)    unknown  . Zantac [Ranitidine Hcl] Other (See Comments)    unknown  . Claritin-D 12 Hour [Loratadine-Pseudoephedrine Er] Anxiety    Outpatient Encounter Medications as of 08/10/2020  Medication Sig  .  acetaminophen (TYLENOL) 500 MG tablet Take 500 mg by mouth every 6 (six) hours.   Marland Kitchen atorvastatin (LIPITOR) 20 MG tablet Take 1 tablet (20 mg total) by mouth daily.  . Biotin (BIOTIN MAXIMUM STRENGTH) 10 MG TABS Take 10 mg by mouth daily.  . Cholecalciferol (VITAMIN D3) 5000 units CAPS Take 5,000 Units by mouth daily.  Marland Kitchen CRANBERRY PO Take 1 capsule by mouth daily.  . cyanocobalamin (,VITAMIN B-12,) 1000 MCG/ML injection 1000 mcg injection once per month. (Patient taking differently: Inject 1,000 mcg into the muscle every 30 (thirty) days. 1000 mcg injection once per month.)  . cycloSPORINE (RESTASIS) 0.05 % ophthalmic emulsion Place 1 drop into both eyes 2 (two) times daily.   . diclofenac Sodium (VOLTAREN) 1 % GEL APPLY 2 TO 4 GRAMS TOPICALLY TO AFFECTED JOINTS UP TO 4 TIMES DAILY  . EPINEPHrine 0.3 mg/0.3 mL IJ SOAJ injection Inject 0.3 mg into the muscle as needed for anaphylaxis.   . fexofenadine (ALLEGRA) 180 MG tablet Take 180 mg by mouth daily.  . fluticasone (FLONASE) 50 MCG/ACT nasal spray SPRAY 2 SPRAYS INTO EACH NOSTRIL EVERY DAY  . furosemide (LASIX) 80 MG tablet Take 1 tablet (80 mg total) by mouth daily.  Marland Kitchen ketoconazole (NIZORAL) 2 % shampoo Apply 1 application topically once a week.   . lansoprazole (PREVACID) 30 MG capsule Take one capsule by mouth once daily at 12 noon.  Marland Kitchen MELATONIN PO Take 10 mg by mouth daily.   . methocarbamol (ROBAXIN) 500 MG tablet Take 1 tablet (500 mg total) by mouth every 8 (eight) hours as needed for muscle spasms.  . montelukast (SINGULAIR) 10 MG tablet Take 1 tablet (10 mg total) by mouth at bedtime.  . predniSONE (DELTASONE) 10 MG tablet TAKE 1 TABLET (10 MG TOTAL) BY MOUTH DAILY WITH BREAKFAST.  Marland Kitchen Syringe/Needle, Disp, (SYRINGE 3CC/25GX1") 25G X 1" 3 ML MISC 1 application by Does not apply route every 30 (thirty) days.  . traMADol (ULTRAM) 50 MG tablet TAKE 1 TABLET BY MOUTH FOUR TIMES A DAY  . potassium chloride (KLOR-CON) 10 MEQ tablet Take 2  tablets (20 mEq total) by mouth 2 (two) times daily. (Patient taking differently: Take 20 mEq by mouth in the morning, at noon, in the evening, and at bedtime. )   No facility-administered encounter medications on file as of 08/10/2020.    Review of Systems  Constitutional: Negative for appetite change, chills, fatigue and fever.  HENT: Positive for hearing loss and trouble swallowing. Negative for congestion, ear discharge, ear pain, rhinorrhea, sinus pressure, sinus pain, sneezing and sore throat.   Eyes: Negative for pain, discharge, redness and itching.  Respiratory: Negative for cough, chest tightness, shortness of breath and wheezing.   Cardiovascular: Negative for chest pain, palpitations and leg swelling.  Gastrointestinal: Negative for abdominal distention, abdominal pain, constipation, diarrhea, nausea and vomiting.  Skin: Negative for color  change, pallor and rash.  Neurological: Negative for dizziness, speech difficulty, light-headedness and headaches.  Psychiatric/Behavioral: Negative for agitation, confusion and sleep disturbance. The patient is not nervous/anxious.     Immunization History  Administered Date(s) Administered  . Influenza, High Dose Seasonal PF 08/16/2018, 09/08/2019  . Influenza,inj,Quad PF,6+ Mos 08/11/2016  . Influenza-Unspecified 11/12/2017  . Moderna SARS-COVID-2 Vaccination 12/15/2019, 01/12/2020  . PPD Test 03/01/2018  . Pneumococcal Conjugate-13 06/26/2017  . Pneumococcal Polysaccharide-23 12/11/2009  . Zoster Recombinat (Shingrix) 01/28/2019, 09/02/2019   Pertinent  Health Maintenance Due  Topic Date Due  . INFLUENZA VACCINE  07/11/2020  . DEXA SCAN  Completed  . PNA vac Low Risk Adult  Completed   Fall Risk  08/10/2020 07/29/2020 07/22/2020 07/16/2020 07/08/2020  Falls in the past year? 1 0 0 0 0  Number falls in past yr: 0 0 0 0 0  Comment - - - - -  Injury with Fall? 0 - - - -  Comment - - - - -  Risk Factor Category  - - - - -  Risk for  fall due to : - - - - -  Risk for fall due to: Comment - - - - -  Follow up - - - - -    Vitals:   08/10/20 1440  BP: 128/80  Pulse: 92  Resp: 16  Temp: (!) 97.5 F (36.4 C)  SpO2: 96%  Weight: 164 lb 6.4 oz (74.6 kg)  Height: 5\' 4"  (1.626 m)   Body mass index is 28.22 kg/m. Physical Exam Vitals reviewed.  Constitutional:      General: She is not in acute distress.    Appearance: She is overweight. She is not ill-appearing.  HENT:     Head: Normocephalic.     Right Ear: Tympanic membrane, ear canal and external ear normal. There is no impacted cerumen.     Left Ear: Tympanic membrane, ear canal and external ear normal. There is no impacted cerumen.     Nose: Nose normal. No congestion or rhinorrhea.     Mouth/Throat:     Mouth: Mucous membranes are moist.     Pharynx: Oropharynx is clear. No oropharyngeal exudate or posterior oropharyngeal erythema.  Eyes:     General: No scleral icterus.       Right eye: No discharge.        Left eye: No discharge.     Conjunctiva/sclera: Conjunctivae normal.     Pupils: Pupils are equal, round, and reactive to light.  Cardiovascular:     Rate and Rhythm: Normal rate and regular rhythm.     Pulses: Normal pulses.     Heart sounds: Normal heart sounds. No murmur heard.  No friction rub. No gallop.   Pulmonary:     Effort: Pulmonary effort is normal. No respiratory distress.     Breath sounds: Normal breath sounds. No wheezing, rhonchi or rales.  Chest:     Chest wall: No tenderness.  Abdominal:     General: Bowel sounds are normal. There is no distension.     Palpations: Abdomen is soft. There is no mass.     Tenderness: There is no abdominal tenderness. There is no right CVA tenderness, left CVA tenderness, guarding or rebound.  Musculoskeletal:     Cervical back: Normal range of motion. No rigidity or tenderness.  Lymphadenopathy:     Cervical: No cervical adenopathy.  Skin:    General: Skin is warm.     Coloration: Skin is  not pale.     Findings: No bruising or erythema.  Neurological:     Mental Status: She is alert and oriented to person, place, and time.     Cranial Nerves: No cranial nerve deficit.     Sensory: No sensory deficit.     Motor: No weakness.     Coordination: Coordination normal.     Gait: Gait normal.  Psychiatric:        Mood and Affect: Mood normal.        Behavior: Behavior normal.        Thought Content: Thought content normal.        Judgment: Judgment normal.     Labs reviewed: Recent Labs    08/21/19 1520 04/06/20 0730 06/28/20 1813 07/13/20 0710 07/19/20 1438  NA 140   < > 143 141 143  K 3.9   < > 4.4 4.3 3.6  CL 101   < > 105 100 104  CO2 28   < > 27 27 30   GLUCOSE 165*   < > 124* 87 76  BUN 27*   < > 9 23 22   CREATININE 1.16   < > 0.63 1.14* 1.01*  CALCIUM 10.1   < > 9.4 10.1 10.2  MG 1.8  --   --   --   --    < > = values in this interval not displayed.   Recent Labs    08/21/19 1520 04/06/20 0730 06/23/20 1323 06/23/20 1323 06/25/20 0941 07/13/20 0710 07/19/20 1438  AST 17   < > 18   < > 20 19 15   ALT 17   < > 15   < > 14 13 11   ALKPHOS 45  --  49  --  43  --   --   BILITOT 0.5   < > 1.2   < > 1.1 0.5 0.5  PROT 6.7   < > 7.3   < > 6.3* 7.5 7.1  ALBUMIN 4.0  --  3.3*  --  2.7*  --   --    < > = values in this interval not displayed.   Recent Labs    06/25/20 0849 06/26/20 0518 06/28/20 0542 07/13/20 0710 07/19/20 1438  WBC 10.9*   < > 9.4 13.7* 10.5  NEUTROABS 7.8*  --   --  8,234* 5,523  HGB 11.5*   < > 12.2 13.9 13.2  HCT 37.1   < > 38.5 42.0 40.5  MCV 102.2*   < > 98.7 95.0 96.4  PLT 256   < > 278 354 278   < > = values in this interval not displayed.   Lab Results  Component Value Date   TSH 2.04 07/27/2020   No results found for: HGBA1C Lab Results  Component Value Date   CHOL 176 04/06/2020   HDL 67 04/06/2020   LDLCALC 82 04/06/2020   TRIG 171 (H) 04/06/2020   CHOLHDL 2.6 04/06/2020    Significant Diagnostic Results in  last 30 days:  No results found.  Assessment/Plan 1. Hoarseness Has had hoarseness since a pill was stuck on her throat three days ago.Unclear if irritated vocal cords attempting to get rid of the stuck pill. Negative exam finding.will have her evaluated by ENT. - Ambulatory referral to ENT - Ambulatory referral to Home Health: ST at the facility to evaluate.  2. Other dysphagia - advised to cut or crush pills if appropriate with pharmacy and mix in apple sauce/pudding.Again thinks only  has problems with smaller pills unclear why.  Swallowing precaution advised  - Ambulatory referral to ENT - Ambulatory referral to Home Health: ST at the facility to evaluate. Consider swallow evaluation  - Additional information provide on AVS for dysphagia.   Family/ staff Communication: Reviewed plan of care with patient verbalized understanding.   Labs/tests ordered: None   Next Appointment: As needed if symptoms worsen or fail to improve.    Sandrea Hughs, NP

## 2020-08-10 NOTE — Patient Instructions (Signed)
Refer ordered today for ENT specialist office will call you for appointment.  Dysphagia  Dysphagia is trouble swallowing. This condition occurs when solids and liquids stick in a person's throat on the way down to the stomach, or when food takes longer to get to the stomach than usual. You may have problems swallowing food, liquids, or both. You may also have pain while trying to swallow. It may take you more time and effort to swallow something. What are the causes? This condition may be caused by:  Muscle problems. They may make it difficult for you to move food and liquids through the esophagus, which is the tube that connects your mouth to your stomach.  Blockages. You may have ulcers, scar tissue, or inflammation that blocks the normal passage of food and liquids. Causes of these problems include: ? Acid reflux from your stomach into your esophagus (gastroesophageal reflux). ? Infections. ? Radiation treatment for cancer. ? Medicines taken without enough fluids to wash them down into your stomach.  Stroke. This can affect the nerves and make it difficult to swallow.  Nerve problems. These prevent signals from being sent to the muscles of your esophagus to squeeze (contract) and move what you swallow down to your stomach.  Globus pharyngeus. This is a common problem that involves a feeling like something is stuck in your throat or a sense of trouble with swallowing, even though nothing is wrong with the swallowing passages.  Certain conditions, such as cerebral palsy or Parkinson's disease. What are the signs or symptoms? Common symptoms of this condition include:  A feeling that solids or liquids are stuck in your throat on the way down to the stomach.  Pain while swallowing.  Coughing or gagging while trying to swallow. Other symptoms include:  Food moving back from your stomach to your mouth (regurgitation).  Noises coming from your throat.  Chest discomfort with  swallowing.  A feeling of fullness when swallowing.  Drooling, especially when the throat is blocked.  Heartburn. How is this diagnosed? This condition may be diagnosed by:  Barium X-ray. In this test, you will swallow a white liquid that sticks to the inside of your esophagus. X-ray images are then taken.  Endoscopy. In this test, a flexible telescope is inserted down your throat to look at your esophagus and your stomach.  CT scans and an MRI. How is this treated? Treatment for dysphagia depends on the cause of this condition, such as:  If the dysphagia is caused by acid reflux or infection, medicines may be used. They may include antibiotics and heartburn medicines.  If the dysphagia is caused by problems with the muscles, swallowing therapy may be used to help you strengthen your swallowing muscles. You may have to do specific exercises to strengthen the muscles or stretch them.  If the dysphagia is caused by a blockage or mass, procedures to remove the blockage may be done. You may need surgery and a feeding tube. You may need to make diet changes. Ask your health care provider for specific instructions. Follow these instructions at home: Medicines  Take over-the-counter and prescription medicines only as told by your health care provider.  If you were prescribed an antibiotic medicine, take it as told by your health care provider. Do not stop taking the antibiotic even if you start to feel better. Eating and drinking   Follow any diet changes as told by your health care provider.  Work with a diet and nutrition specialist (dietitian) to create  an eating plan that will help you get the nutrients you need in order to stay healthy.  Eat soft foods that are easier to swallow.  Cut your food into small pieces and eat slowly. Take small bites.  Eat and drink only when you are sitting upright.  Do not drink alcohol or caffeine. If you need help quitting, ask your health care  provider. General instructions  Check your weight every day to make sure you are not losing weight.  Do not use any products that contain nicotine or tobacco, such as cigarettes, e-cigarettes, and chewing tobacco. If you need help quitting, ask your health care provider.  Keep all follow-up visits as told by your health care provider. This is important. Contact a health care provider if you:  Lose weight because you cannot swallow.  Cough when you drink liquids.  Cough up partially digested food. Get help right away if you:  Cannot swallow your saliva.  Have shortness of breath, a fever, or both.  Have a hoarse voice and also have trouble swallowing. Summary  Dysphagia is trouble swallowing. This condition occurs when solids and liquids stick in a person's throat on the way down to the stomach. You may cough or gag while trying to swallow.  Dysphagia has many possible causes.  Treatment for dysphagia depends on the cause of the condition.  Keep all follow-up visits as told by your health care provider. This is important. This information is not intended to replace advice given to you by your health care provider. Make sure you discuss any questions you have with your health care provider. Document Revised: 04/23/2019 Document Reviewed: 04/23/2019 Elsevier Patient Education  [place].

## 2020-08-12 DIAGNOSIS — M6281 Muscle weakness (generalized): Secondary | ICD-10-CM | POA: Diagnosis not present

## 2020-08-12 DIAGNOSIS — K5700 Diverticulitis of small intestine with perforation and abscess without bleeding: Secondary | ICD-10-CM | POA: Diagnosis not present

## 2020-08-12 DIAGNOSIS — M542 Cervicalgia: Secondary | ICD-10-CM | POA: Diagnosis not present

## 2020-08-12 DIAGNOSIS — R2681 Unsteadiness on feet: Secondary | ICD-10-CM | POA: Diagnosis not present

## 2020-08-12 DIAGNOSIS — R1319 Other dysphagia: Secondary | ICD-10-CM | POA: Diagnosis not present

## 2020-08-12 DIAGNOSIS — R1312 Dysphagia, oropharyngeal phase: Secondary | ICD-10-CM | POA: Diagnosis not present

## 2020-08-16 DIAGNOSIS — M6281 Muscle weakness (generalized): Secondary | ICD-10-CM | POA: Diagnosis not present

## 2020-08-16 DIAGNOSIS — K5700 Diverticulitis of small intestine with perforation and abscess without bleeding: Secondary | ICD-10-CM | POA: Diagnosis not present

## 2020-08-16 DIAGNOSIS — R1319 Other dysphagia: Secondary | ICD-10-CM | POA: Diagnosis not present

## 2020-08-16 DIAGNOSIS — R1312 Dysphagia, oropharyngeal phase: Secondary | ICD-10-CM | POA: Diagnosis not present

## 2020-08-16 DIAGNOSIS — M542 Cervicalgia: Secondary | ICD-10-CM | POA: Diagnosis not present

## 2020-08-16 DIAGNOSIS — R2681 Unsteadiness on feet: Secondary | ICD-10-CM | POA: Diagnosis not present

## 2020-08-17 ENCOUNTER — Ambulatory Visit
Admission: RE | Admit: 2020-08-17 | Discharge: 2020-08-17 | Disposition: A | Discharge status: Home / Self Care | Payer: Medicare PPO | Source: Ambulatory Visit | Attending: General Surgery | Admitting: General Surgery

## 2020-08-17 ENCOUNTER — Other Ambulatory Visit: Payer: Self-pay

## 2020-08-17 DIAGNOSIS — N838 Other noninflammatory disorders of ovary, fallopian tube and broad ligament: Secondary | ICD-10-CM | POA: Diagnosis not present

## 2020-08-17 DIAGNOSIS — K5700 Diverticulitis of small intestine with perforation and abscess without bleeding: Secondary | ICD-10-CM | POA: Diagnosis not present

## 2020-08-17 DIAGNOSIS — K5720 Diverticulitis of large intestine with perforation and abscess without bleeding: Secondary | ICD-10-CM | POA: Diagnosis not present

## 2020-08-17 DIAGNOSIS — K5792 Diverticulitis of intestine, part unspecified, without perforation or abscess without bleeding: Secondary | ICD-10-CM

## 2020-08-17 DIAGNOSIS — N83202 Unspecified ovarian cyst, left side: Secondary | ICD-10-CM | POA: Diagnosis not present

## 2020-08-17 MED ORDER — IOPAMIDOL (ISOVUE-300) INJECTION 61%
100.0000 mL | Freq: Once | INTRAVENOUS | Status: AC | PRN
  Administered 2020-08-17: 100 mL via INTRAVENOUS

## 2020-08-18 ENCOUNTER — Encounter: Payer: Self-pay | Admitting: Internal Medicine

## 2020-08-18 ENCOUNTER — Other Ambulatory Visit: Payer: Self-pay | Admitting: Nurse Practitioner

## 2020-08-18 DIAGNOSIS — R1312 Dysphagia, oropharyngeal phase: Secondary | ICD-10-CM | POA: Diagnosis not present

## 2020-08-18 DIAGNOSIS — R1319 Other dysphagia: Secondary | ICD-10-CM | POA: Diagnosis not present

## 2020-08-18 DIAGNOSIS — K5700 Diverticulitis of small intestine with perforation and abscess without bleeding: Secondary | ICD-10-CM | POA: Diagnosis not present

## 2020-08-18 DIAGNOSIS — R600 Localized edema: Secondary | ICD-10-CM

## 2020-08-18 DIAGNOSIS — R2681 Unsteadiness on feet: Secondary | ICD-10-CM | POA: Diagnosis not present

## 2020-08-18 DIAGNOSIS — M542 Cervicalgia: Secondary | ICD-10-CM | POA: Diagnosis not present

## 2020-08-18 DIAGNOSIS — M6281 Muscle weakness (generalized): Secondary | ICD-10-CM | POA: Diagnosis not present

## 2020-08-19 MED ORDER — CYANOCOBALAMIN 1000 MCG/ML IJ SOLN
INTRAMUSCULAR | 1 refills | Status: DC
Start: 2020-08-19 — End: 2020-09-02

## 2020-08-19 MED ORDER — POTASSIUM CHLORIDE CRYS ER 10 MEQ PO TBCR: 20.0000 meq | EXTENDED_RELEASE_TABLET | Freq: Two times a day (BID) | ORAL | 1 refills | Status: DC

## 2020-08-23 DIAGNOSIS — R1312 Dysphagia, oropharyngeal phase: Secondary | ICD-10-CM | POA: Diagnosis not present

## 2020-08-23 DIAGNOSIS — K5700 Diverticulitis of small intestine with perforation and abscess without bleeding: Secondary | ICD-10-CM | POA: Diagnosis not present

## 2020-08-23 DIAGNOSIS — M542 Cervicalgia: Secondary | ICD-10-CM | POA: Diagnosis not present

## 2020-08-23 DIAGNOSIS — R2681 Unsteadiness on feet: Secondary | ICD-10-CM | POA: Diagnosis not present

## 2020-08-23 DIAGNOSIS — M6281 Muscle weakness (generalized): Secondary | ICD-10-CM | POA: Diagnosis not present

## 2020-08-23 DIAGNOSIS — R1319 Other dysphagia: Secondary | ICD-10-CM | POA: Diagnosis not present

## 2020-08-25 ENCOUNTER — Ambulatory Visit (INDEPENDENT_AMBULATORY_CARE_PROVIDER_SITE_OTHER): Payer: Medicare PPO | Admitting: Otolaryngology

## 2020-08-26 DIAGNOSIS — R1319 Other dysphagia: Secondary | ICD-10-CM | POA: Diagnosis not present

## 2020-08-26 DIAGNOSIS — K5700 Diverticulitis of small intestine with perforation and abscess without bleeding: Secondary | ICD-10-CM | POA: Diagnosis not present

## 2020-08-26 DIAGNOSIS — M6281 Muscle weakness (generalized): Secondary | ICD-10-CM | POA: Diagnosis not present

## 2020-08-26 DIAGNOSIS — R1312 Dysphagia, oropharyngeal phase: Secondary | ICD-10-CM | POA: Diagnosis not present

## 2020-08-26 DIAGNOSIS — R2681 Unsteadiness on feet: Secondary | ICD-10-CM | POA: Diagnosis not present

## 2020-08-26 DIAGNOSIS — M542 Cervicalgia: Secondary | ICD-10-CM | POA: Diagnosis not present

## 2020-08-27 NOTE — Progress Notes (Signed)
Office Visit Note  Patient: Eileen Wilkerson             Date of Birth: Mar 08, 1932           MRN: [id number]             PCP: [name], MD Referring: [name], MD Visit Date: 09/02/2020 Occupation: @GUAROCC @  Subjective:  Other (per patient - CT scan revealed severe osteoarthritis of the right hip)   History of Present Illness: Eileen Wilkerson is a 86 y.o. female with history of rheumatoid arthritis and osteoarthritis.  She was a started on leflunomide after the last visit.  She states she stopped the medication as she did not like the side effects.  She does not recall what side effects she experienced.  She has been taking prednisone for adrenal insufficiency.  She states that is sufficient to control her symptoms.  She had recent CT abdomen for diverticulitis.  She states she was told that she had osteoarthritis in her right hip.  She states she has some discomfort in her right hip joint but is tolerable.  She uses a cane to walk around.  Activities of Daily Living:  Patient reports morning stiffness for 2 hours.   Patient Denies nocturnal pain.  Difficulty dressing/grooming: Denies Difficulty climbing stairs: Reports Difficulty getting out of chair: Reports Difficulty using hands for taps, buttons, cutlery, and/or writing: Reports  Review of Systems  Constitutional: Positive for fatigue.  HENT: Positive for mouth dryness. Negative for mouth sores and nose dryness.   Eyes: Positive for visual disturbance. Negative for pain, itching and dryness.  Respiratory: Negative for cough, shortness of breath and difficulty breathing.        With exertion  Cardiovascular: Negative for chest pain, palpitations and swelling in legs/feet.  Gastrointestinal: Positive for abdominal pain. Negative for blood in stool, constipation and diarrhea.       Due to recent diverticulitis   Endocrine: Negative for increased urination.  Genitourinary: Negative for painful urination.    Musculoskeletal: Positive for arthralgias, joint pain, joint swelling, myalgias, morning stiffness, muscle tenderness and myalgias. Negative for muscle weakness.  Skin: Negative for color change, rash and redness.  Allergic/Immunologic: Negative for susceptible to infections.  Neurological: Positive for weakness. Negative for dizziness, numbness, headaches and memory loss.  Hematological: Positive for bruising/bleeding tendency.  Psychiatric/Behavioral: Negative for confusion and sleep disturbance.    PMFS History:  Patient Active Problem List   Diagnosis Date Noted  . UTI (urinary tract infection) 07/22/2020  . Allergic rhinitis 07/22/2020  . Asthenia 07/08/2020  . Perforation of sigmoid colon due to diverticulitis 06/23/2020  . Hair loss 08/06/2019  . SOB (shortness of breath) on exertion 08/06/2019  . Pure hypercholesterolemia 08/06/2019  . B12 deficiency 03/11/2018  . E. coli UTI 01/18/2018  . Edema of both lower extremities due to peripheral venous insufficiency 09/18/2017  . Bilateral lower extremity edema 06/30/2017  . Physical deconditioning 05/30/2017  . Immunosuppressed status ([place]) 05/30/2017  . Abnormal chest x-ray 05/17/2017  . Fibromyalgia 12/18/2016  . DJD (degenerative joint disease), cervical 12/18/2016  . Spondylosis of lumbar region without myelopathy or radiculopathy 12/18/2016  . Trigger finger, right middle finger 12/18/2016  . Primary osteoarthritis of both feet 12/18/2016  . Primary osteoarthritis of both knees 12/18/2016  . GERD (gastroesophageal reflux disease) 12/18/2016  . Age-related osteoporosis without current pathological fracture 12/18/2016  . Rheumatoid arthritis ([place]) 12/28/2015  . Long term current use of systemic steroids, for RA  12/28/2015  . Lymphocytosis 11/13/2012    Past Medical History:  Diagnosis Date  . Adrenal failure ([place])   . Arthritis   . Asthma   . Cancer ([place])   . Cataract   . Closed nondisplaced fracture of fifth right  metatarsal bone 09/18/2017  . Diverticulitis    Per patient  . Fibromyalgia 2008  . HCAP (healthcare-associated pneumonia) 02/03/2018  . Osteoporosis   . RA (rheumatoid arthritis) ([place])   . Recurrent upper respiratory infection (URI)   . Sepsis due to urinary tract infection ([place]) 01/18/2018  . Urticaria     Family History  Problem Relation Age of Onset  . Heart attack Maternal Grandmother   . Heart attack Paternal Grandfather   . Breast cancer Mother 34  . Diabetes Father   . Heart disease Father   . Congestive Heart Failure Father 62       Died from  . Allergic rhinitis Neg Hx   . Asthma Neg Hx   . Eczema Neg Hx   . Urticaria Neg Hx    Past Surgical History:  Procedure Laterality Date  . BACK SURGERY    . BILATERAL CARPAL TUNNEL RELEASE  2005   right and left  . CATARACT EXTRACTION, BILATERAL  2004   right and left  . CERVICAL FUSION  2011,2010,2008   2 disks  . HEEL SPUR SURGERY  2004  . lower back fusion  2011   Fusion of 3-4 and 4-5 lower back  . [place]  . ROTATOR CUFF REPAIR  [id number]  . SQUAMOUS CELL CARCINOMA EXCISION    . TONSILLECTOMY AND ADENOIDECTOMY  1947  . TOTAL SHOULDER ARTHROPLASTY     Social History   Social History Narrative      Diet:        Do you drink/ eat things with caffeine? Dr. Malachi Bonds 2/ day      Marital status: Widowed                              What year were you married ? 1953      Do you live in a house, apartment,assistred living, condo, trailer, etc.)? Apartment      Is it one or more stories?       How many persons live in your home ? 1      Do you have any pets in your home ?(please list) No      Highest Level of education completed: PhD       Current or past profession: Transport planner, Education officer, museum, Special Educator       Do you exercise?   No                           Type & how often       ADVANCED DIRECTIVES (Please bring copies)      Do you have a living will? Yes      Do you have a DNR  form?                       If not, do you want to discuss one? Yes      Do you have signed POA?HPOA forms?                 If so, please bring to your appointment Tes  FUNCTIONAL STATUS- To be completed by Spouse / child / Staff       Do you have difficulty bathing or dressing yourself ? No      Do you have difficulty preparing food or eating ? No      Do you have difficulty managing your mediation ? No      Do you have difficulty managing your finances ? No      Do you have difficulty affording your medication ? No      Immunization History  Administered Date(s) Administered  . Influenza, High Dose Seasonal PF 08/16/2018, 09/08/2019  . Influenza,inj,Quad PF,6+ Mos 08/11/2016  . Influenza-Unspecified 11/12/2017  . Moderna SARS-COVID-2 Vaccination 12/15/2019, 01/12/2020  . PPD Test 03/01/2018  . Pneumococcal Conjugate-13 06/26/2017  . Pneumococcal Polysaccharide-23 12/11/2009  . Zoster Recombinat (Shingrix) 01/28/2019, 09/02/2019     Objective: Vital Signs: BP 118/75 (BP Location: Left Arm, Patient Position: Sitting, Cuff Size: Small)   Pulse (!) 112   Resp 20   Ht 5' 4.5" (1.638 m)   Wt 168 lb 9.6 oz (76.5 kg)   BMI 28.49 kg/m    Physical Exam Vitals and nursing note reviewed.  Constitutional:      Appearance: She is well-developed.  HENT:     Head: Normocephalic and atraumatic.  Eyes:     Conjunctiva/sclera: Conjunctivae normal.  Cardiovascular:     Rate and Rhythm: Normal rate and regular rhythm.     Heart sounds: Normal heart sounds.  Pulmonary:     Effort: Pulmonary effort is normal.     Breath sounds: Normal breath sounds.  Abdominal:     General: Bowel sounds are normal.     Palpations: Abdomen is soft.  Musculoskeletal:     Cervical back: Normal range of motion.  Lymphadenopathy:     Cervical: No cervical adenopathy.  Skin:    General: Skin is warm and dry.     Capillary Refill: Capillary refill takes less than 2 seconds.  Neurological:      Mental Status: She is alert and oriented to person, place, and time.  Psychiatric:        Behavior: Behavior normal.      Musculoskeletal Exam: She has limited range of motion of cervical spine.  Shoulder joints, elbow joints, wrist joints with good range of motion.  She has PIP and DIP thickening.  No synovitis was noted over MCP joints.  She had limited painful range of motion of her right hip joint.  Knee joints, ankle joints with good range of motion.  She had no tenderness over MTPs.  CDAI Exam: CDAI Score: 0.6  Patient Global: 4 mm; Provider Global: 2 mm Swollen: 0 ; Tender: 0  Joint Exam 09/02/2020   No joint exam has been documented for this visit   There is currently no information documented on the homunculus. Go to the Rheumatology activity and complete the homunculus joint exam.  Investigation: No additional findings.  Imaging: CT ABDOMEN PELVIS W CONTRAST  Result Date: 08/17/2020 CLINICAL DATA:  Abdominal pain.  Follow-up diverticulitis. EXAM: CT ABDOMEN AND PELVIS WITH CONTRAST TECHNIQUE: Multidetector CT imaging of the abdomen and pelvis was performed using the standard protocol following bolus administration of intravenous contrast. CONTRAST:  144mL ISOVUE-300 IOPAMIDOL (ISOVUE-300) INJECTION 61% COMPARISON:  06/23/2020 FINDINGS: Lower Chest: No acute findings. Hepatobiliary: No hepatic masses identified. Gallbladder is unremarkable. No evidence of biliary ductal dilatation. Pancreas:  No mass or inflammatory changes. Spleen: Within normal limits in size and appearance. Adrenals/Urinary  Tract: No masses identified. Small bilateral renal cysts remains stable. No evidence of ureteral calculi or hydronephrosis. Small bladder cystocele again noted. Stomach/Bowel: Sigmoid diverticulitis has nearly completely resolved since previous study. A persistent well-defined gas and fluid collection is seen in the sigmoid mesentery which measures 4.3 x 3.8 cm. This consistent with a  diverticular abscess. Tiny hiatal hernia again noted. Vascular/Lymphatic: No pathologically enlarged lymph nodes. No abdominal aortic aneurysm. Aortic atherosclerosis noted. Reproductive: Normal appearance of uterus. Simple appearing cyst in the left adnexa which measures 3.1 x 3.1 cm shows no significant change in size or appearance since recent study. Other:  None. Musculoskeletal: No suspicious bone lesions identified. Severe right hip osteoarthritis again noted. IMPRESSION: Near complete resolution of sigmoid diverticulitis since previous study. 4 cm well-defined diverticular abscess in the sigmoid mesentery. Stable 3.1 cm benign-appearing cyst in left adnexa. Recommend follow-up US in 6-12 months. Note: This recommendation does not apply to premenarchal patients and to those with increased risk (genetic, family history, elevated tumor markers or other high-risk factors) of ovarian cancer. Reference: JACR 2020 Feb; 17(2):248-254. Aortic Atherosclerosis (ICD10-I70.0). Electronically Signed   By: Marlaine Hind M.D.   On: 08/17/2020 16:40    Recent Labs: Lab Results  Component Value Date   WBC 10.5 07/19/2020   HGB 13.2 07/19/2020   PLT 278 07/19/2020   NA 143 07/19/2020   K 3.6 07/19/2020   CL 104 07/19/2020   CO2 30 07/19/2020   GLUCOSE 76 07/19/2020   BUN 22 07/19/2020   CREATININE 1.01 (H) 07/19/2020   BILITOT 0.5 07/19/2020   ALKPHOS 43 06/25/2020   AST 15 07/19/2020   ALT 11 07/19/2020   PROT 7.1 07/19/2020   ALBUMIN 2.7 (L) 06/25/2020   CALCIUM 10.2 07/19/2020   GFRAA 59 (L) 07/19/2020    Speciality Comments: PLQ eye exam:10/22/18 Normal. [facility] Follow up in 6 months. Prior therapy: MTX (recurrent infections) Therapy Contraindications: Anti-TNF's due to CHF  Procedures:  No procedures performed Allergies: Adhesive [tape], Celebrex [celecoxib], Ciprofibrate, Cymbalta [duloxetine hcl], Gabitril [tiagabine], Keflex [cephalexin], Lyrica [pregabalin], Neurontin  [gabapentin], Nexium [esomeprazole], Nsaids, Penicillins, Shrimp [shellfish allergy], Sulfa antibiotics, Azactam [aztreonam], Azelastine hcl, Ciprofloxacin, Claritin [loratadine], Methotrexate derivatives, Nasacort [triamcinolone], Olopatadine, Sulfamethizole, Zantac [ranitidine hcl], and Claritin-d 12 hour [loratadine-pseudoephedrine er]   Assessment / Plan:     Visit Diagnoses: Rheumatoid arthritis involving multiple sites with positive rheumatoid factor (HCC)-she had no synovitis on examination today.  She stopped her leflunomide as she felt that it was causing side effects.  Her symptoms are pretty well managed with the prednisone.  She would not be able to come off prednisone due to adrenal insufficiency.  She states besides her right hip joint discomfort she is not having any pain and hip pain is manageable.  High risk medication use -  Prednisone 10 mg-adrenal insufficiency. D/c PLQ-Sept 2020-hair loss. D/c MTX in 01/19-infections.  Leflunomide discontinued due to side effects.  Her labs in August were normal except for mild elevation of creatinine.  Primary osteoarthritis of both hands-joint protection muscle strengthening was discussed.  Pain in right hip-she states a CT scan recently showed she had osteoarthritis.  She has some limitation of range of motion and some discomfort.  She states the pain is manageable.  Primary osteoarthritis of both knees-she continues to have some discomfort in her knee joints.  Primary osteoarthritis of both feet-she has discomfort in her feet which is tolerable.  Age-related osteoporosis without current pathological fracture - Last bone density from December 27, 2017 showed a T score of -1.7.  She was treated with Reclast in the past.  She is on a drug holiday currently.  She was getting DEXA through her PCP.  She states she will get her repeat DEXA through her PCP.  She was on Reclast in the past.  I think she will be a good candidate for Prolia subcu  injections.  Once she gets the DEXA results we will discuss this further with her.  Fibromyalgia-she continues to have some generalized pain and discomfort.  DDD (degenerative disc disease), cervical-she has limited range of motion of the cervical spine.  History of fatigue-she complains of some fatigue.  Adrenal insufficiency (HCC)-followed by endocrinologist.  She is on prednisone 10 mg p.o. daily.  History of CHF (congestive heart failure)  Educated about COVID-19 virus infection-she has had both COVID-19 vaccines.  As she is on long-term steroids she is a candidate for booster.  She will discuss this further at [facility], where she resides.  Use of mask, social distancing and hand hygiene was discussed.  I also discussed that she will be a candidate for monoclonal antibody infusion in case she gets COVID-19 infection.  Orders: No orders of the defined types were placed in this encounter.  No orders of the defined types were placed in this encounter.    Follow-Up Instructions: Return in about 6 months (around 03/02/2021) for Rheumatoid arthritis, Osteoarthritis.   Bo Merino, MD  Note - This record has been created using Editor, commissioning.  Chart creation errors have been sought, but may not always  have been located. Such creation errors do not reflect on  the standard of medical care.

## 2020-08-30 DIAGNOSIS — M542 Cervicalgia: Secondary | ICD-10-CM | POA: Diagnosis not present

## 2020-08-30 DIAGNOSIS — M6281 Muscle weakness (generalized): Secondary | ICD-10-CM | POA: Diagnosis not present

## 2020-08-30 DIAGNOSIS — R2681 Unsteadiness on feet: Secondary | ICD-10-CM | POA: Diagnosis not present

## 2020-08-30 DIAGNOSIS — K5700 Diverticulitis of small intestine with perforation and abscess without bleeding: Secondary | ICD-10-CM | POA: Diagnosis not present

## 2020-08-30 DIAGNOSIS — R1319 Other dysphagia: Secondary | ICD-10-CM | POA: Diagnosis not present

## 2020-08-30 DIAGNOSIS — R1312 Dysphagia, oropharyngeal phase: Secondary | ICD-10-CM | POA: Diagnosis not present

## 2020-09-01 ENCOUNTER — Other Ambulatory Visit: Payer: Self-pay | Admitting: Internal Medicine

## 2020-09-01 DIAGNOSIS — M797 Fibromyalgia: Secondary | ICD-10-CM

## 2020-09-01 DIAGNOSIS — R1319 Other dysphagia: Secondary | ICD-10-CM | POA: Diagnosis not present

## 2020-09-01 DIAGNOSIS — M47816 Spondylosis without myelopathy or radiculopathy, lumbar region: Secondary | ICD-10-CM

## 2020-09-01 DIAGNOSIS — M542 Cervicalgia: Secondary | ICD-10-CM | POA: Diagnosis not present

## 2020-09-01 DIAGNOSIS — R1312 Dysphagia, oropharyngeal phase: Secondary | ICD-10-CM | POA: Diagnosis not present

## 2020-09-01 DIAGNOSIS — K5700 Diverticulitis of small intestine with perforation and abscess without bleeding: Secondary | ICD-10-CM | POA: Diagnosis not present

## 2020-09-01 DIAGNOSIS — M0579 Rheumatoid arthritis with rheumatoid factor of multiple sites without organ or systems involvement: Secondary | ICD-10-CM

## 2020-09-01 DIAGNOSIS — M6281 Muscle weakness (generalized): Secondary | ICD-10-CM | POA: Diagnosis not present

## 2020-09-01 DIAGNOSIS — R2681 Unsteadiness on feet: Secondary | ICD-10-CM | POA: Diagnosis not present

## 2020-09-02 ENCOUNTER — Other Ambulatory Visit: Payer: Self-pay

## 2020-09-02 ENCOUNTER — Ambulatory Visit: Payer: Medicare PPO | Admitting: Rheumatology

## 2020-09-02 ENCOUNTER — Encounter: Payer: Self-pay | Admitting: Rheumatology

## 2020-09-02 VITALS — BP 118/75 | HR 112 | Resp 20 | Ht 64.5 in | Wt 168.6 lb

## 2020-09-02 DIAGNOSIS — M5030 Other cervical disc degeneration, unspecified cervical region: Secondary | ICD-10-CM

## 2020-09-02 DIAGNOSIS — M0579 Rheumatoid arthritis with rheumatoid factor of multiple sites without organ or systems involvement: Secondary | ICD-10-CM | POA: Diagnosis not present

## 2020-09-02 DIAGNOSIS — Z79899 Other long term (current) drug therapy: Secondary | ICD-10-CM

## 2020-09-02 DIAGNOSIS — Z7189 Other specified counseling: Secondary | ICD-10-CM

## 2020-09-02 DIAGNOSIS — M19041 Primary osteoarthritis, right hand: Secondary | ICD-10-CM

## 2020-09-02 DIAGNOSIS — M170 Bilateral primary osteoarthritis of knee: Secondary | ICD-10-CM | POA: Diagnosis not present

## 2020-09-02 DIAGNOSIS — M810 Age-related osteoporosis without current pathological fracture: Secondary | ICD-10-CM | POA: Diagnosis not present

## 2020-09-02 DIAGNOSIS — Z87898 Personal history of other specified conditions: Secondary | ICD-10-CM | POA: Diagnosis not present

## 2020-09-02 DIAGNOSIS — M19042 Primary osteoarthritis, left hand: Secondary | ICD-10-CM

## 2020-09-02 DIAGNOSIS — M25551 Pain in right hip: Secondary | ICD-10-CM

## 2020-09-02 DIAGNOSIS — M797 Fibromyalgia: Secondary | ICD-10-CM

## 2020-09-02 DIAGNOSIS — M19071 Primary osteoarthritis, right ankle and foot: Secondary | ICD-10-CM | POA: Diagnosis not present

## 2020-09-02 DIAGNOSIS — Z8679 Personal history of other diseases of the circulatory system: Secondary | ICD-10-CM

## 2020-09-02 DIAGNOSIS — E2740 Unspecified adrenocortical insufficiency: Secondary | ICD-10-CM

## 2020-09-02 DIAGNOSIS — M19072 Primary osteoarthritis, left ankle and foot: Secondary | ICD-10-CM

## 2020-09-02 NOTE — Telephone Encounter (Signed)
RX last filled on 08/05/2020  Treatment agreement on file form 07/16/2020

## 2020-09-02 NOTE — Patient Instructions (Signed)
COVID-19 vaccine recommendations:   COVID-19 vaccine is recommended for everyone (unless you are allergic to a vaccine component), even if you are on a medication that suppresses your immune system.   Do not take Tylenol or any anti-inflammatory medications (NSAIDs) 24 hours prior to the COVID-19 vaccination.   There is no direct evidence about the efficacy of the COVID-19 vaccine in individuals who are on medications that suppress the immune system.   Even if you are fully vaccinated, and you are on any medications that suppress your immune system, please continue to wear a mask, maintain at least six feet social distance and practice hand hygiene.   If you develop a COVID-19 infection, please contact your PCP or our office to determine if you need antibody infusion.  The booster vaccine is now available for immunocompromised patients. It is advised that if you had Pfizer vaccine you should get Pfizer booster.  If you had a Moderna vaccine then you should get a Moderna booster. Johnson and Johnson does not have a booster vaccine at this time.  Please see the following web sites for updated information.   [email or web address]://[email or web address]-Vaccination-Patient-Resources.[email or web address]  [email or web address]://[email or web address]/About-[email or web address]-Releases/ID/1159  

## 2020-09-06 DIAGNOSIS — R1312 Dysphagia, oropharyngeal phase: Secondary | ICD-10-CM | POA: Diagnosis not present

## 2020-09-06 DIAGNOSIS — M6281 Muscle weakness (generalized): Secondary | ICD-10-CM | POA: Diagnosis not present

## 2020-09-06 DIAGNOSIS — R1319 Other dysphagia: Secondary | ICD-10-CM | POA: Diagnosis not present

## 2020-09-06 DIAGNOSIS — R2681 Unsteadiness on feet: Secondary | ICD-10-CM | POA: Diagnosis not present

## 2020-09-06 DIAGNOSIS — K5700 Diverticulitis of small intestine with perforation and abscess without bleeding: Secondary | ICD-10-CM | POA: Diagnosis not present

## 2020-09-06 DIAGNOSIS — M542 Cervicalgia: Secondary | ICD-10-CM | POA: Diagnosis not present

## 2020-09-09 DIAGNOSIS — K5700 Diverticulitis of small intestine with perforation and abscess without bleeding: Secondary | ICD-10-CM | POA: Diagnosis not present

## 2020-09-09 DIAGNOSIS — R1319 Other dysphagia: Secondary | ICD-10-CM | POA: Diagnosis not present

## 2020-09-09 DIAGNOSIS — R2681 Unsteadiness on feet: Secondary | ICD-10-CM | POA: Diagnosis not present

## 2020-09-09 DIAGNOSIS — R1312 Dysphagia, oropharyngeal phase: Secondary | ICD-10-CM | POA: Diagnosis not present

## 2020-09-09 DIAGNOSIS — M542 Cervicalgia: Secondary | ICD-10-CM | POA: Diagnosis not present

## 2020-09-09 DIAGNOSIS — M6281 Muscle weakness (generalized): Secondary | ICD-10-CM | POA: Diagnosis not present

## 2020-09-15 DIAGNOSIS — R2681 Unsteadiness on feet: Secondary | ICD-10-CM | POA: Diagnosis not present

## 2020-09-15 DIAGNOSIS — K5700 Diverticulitis of small intestine with perforation and abscess without bleeding: Secondary | ICD-10-CM | POA: Diagnosis not present

## 2020-09-15 DIAGNOSIS — M542 Cervicalgia: Secondary | ICD-10-CM | POA: Diagnosis not present

## 2020-09-15 DIAGNOSIS — M6281 Muscle weakness (generalized): Secondary | ICD-10-CM | POA: Diagnosis not present

## 2020-09-17 DIAGNOSIS — M6281 Muscle weakness (generalized): Secondary | ICD-10-CM | POA: Diagnosis not present

## 2020-09-17 DIAGNOSIS — M542 Cervicalgia: Secondary | ICD-10-CM | POA: Diagnosis not present

## 2020-09-17 DIAGNOSIS — R2681 Unsteadiness on feet: Secondary | ICD-10-CM | POA: Diagnosis not present

## 2020-09-17 DIAGNOSIS — K5700 Diverticulitis of small intestine with perforation and abscess without bleeding: Secondary | ICD-10-CM | POA: Diagnosis not present

## 2020-09-19 ENCOUNTER — Other Ambulatory Visit: Payer: Self-pay | Admitting: Nurse Practitioner

## 2020-09-19 ENCOUNTER — Other Ambulatory Visit: Payer: Self-pay | Admitting: Physician Assistant

## 2020-09-20 DIAGNOSIS — M6281 Muscle weakness (generalized): Secondary | ICD-10-CM | POA: Diagnosis not present

## 2020-09-20 DIAGNOSIS — K5700 Diverticulitis of small intestine with perforation and abscess without bleeding: Secondary | ICD-10-CM | POA: Diagnosis not present

## 2020-09-20 DIAGNOSIS — R2681 Unsteadiness on feet: Secondary | ICD-10-CM | POA: Diagnosis not present

## 2020-09-20 DIAGNOSIS — M542 Cervicalgia: Secondary | ICD-10-CM | POA: Diagnosis not present

## 2020-09-20 NOTE — Telephone Encounter (Signed)
This medication comes up as an allergy when trying to refill

## 2020-09-29 ENCOUNTER — Other Ambulatory Visit: Payer: Self-pay | Admitting: Internal Medicine

## 2020-09-29 DIAGNOSIS — M0579 Rheumatoid arthritis with rheumatoid factor of multiple sites without organ or systems involvement: Secondary | ICD-10-CM

## 2020-09-29 DIAGNOSIS — M797 Fibromyalgia: Secondary | ICD-10-CM

## 2020-09-29 DIAGNOSIS — M47816 Spondylosis without myelopathy or radiculopathy, lumbar region: Secondary | ICD-10-CM

## 2020-09-30 NOTE — Telephone Encounter (Signed)
Contract completed 07/2020   LOV 08/10/20  Last Labs 07/19/20 F/U scheduled 10/15/20

## 2020-10-15 ENCOUNTER — Non-Acute Institutional Stay: Payer: Medicare PPO | Admitting: Internal Medicine

## 2020-10-15 ENCOUNTER — Encounter: Payer: Self-pay | Admitting: Internal Medicine

## 2020-10-15 ENCOUNTER — Other Ambulatory Visit: Payer: Self-pay

## 2020-10-15 VITALS — BP 144/86 | HR 84 | Temp 96.8°F | Ht 64.5 in | Wt 175.6 lb

## 2020-10-15 DIAGNOSIS — M810 Age-related osteoporosis without current pathological fracture: Secondary | ICD-10-CM | POA: Diagnosis not present

## 2020-10-15 DIAGNOSIS — R140 Abdominal distension (gaseous): Secondary | ICD-10-CM | POA: Diagnosis not present

## 2020-10-15 DIAGNOSIS — K5732 Diverticulitis of large intestine without perforation or abscess without bleeding: Secondary | ICD-10-CM

## 2020-10-15 DIAGNOSIS — M0579 Rheumatoid arthritis with rheumatoid factor of multiple sites without organ or systems involvement: Secondary | ICD-10-CM | POA: Diagnosis not present

## 2020-10-15 DIAGNOSIS — R0789 Other chest pain: Secondary | ICD-10-CM | POA: Diagnosis not present

## 2020-10-15 DIAGNOSIS — R600 Localized edema: Secondary | ICD-10-CM | POA: Diagnosis not present

## 2020-10-15 DIAGNOSIS — M797 Fibromyalgia: Secondary | ICD-10-CM

## 2020-10-15 DIAGNOSIS — K219 Gastro-esophageal reflux disease without esophagitis: Secondary | ICD-10-CM

## 2020-10-15 DIAGNOSIS — E785 Hyperlipidemia, unspecified: Secondary | ICD-10-CM

## 2020-10-15 NOTE — Progress Notes (Signed)
Location:  [place] of Service:  Clinic (12)  Provider:   Code Status:  Goals of Care:  Advanced Directives 08/10/2020  Does Patient Have a Medical Advance Directive? Yes  Type of Advance Directive [place]  Does patient want to make changes to medical advance directive? No - Patient declined  Copy of [place] in Chart? Yes - validated most recent copy scanned in chart (See row information)  Would patient like information on creating a medical advance directive? -     Chief Complaint  Patient presents with  . Medical Management of Chronic Issues    Patient returns to the clinic for her 3 month follow up. She would like to discuss her skin on her back, lower back pain, PT, rib cage pain, and hair loss.   Marland Kitchen Health Maintenance    TDAP    HPI: Patient is a 86 y.o. female seen today for medical management of chronic diseases.    Patient has a history of seropositive rheumatoid arthritis follows with Dr. Estanislado Pandy on Prednisone History of allergic rhinitis,  history of adrenal insufficiency On Prednisone  lower extremity edema, osteoporosis, chronic pain,, fibromyalgia Was admitted in the hospital from 7/14-7/20 first sigmoid colon diverticulitis with perforation and sepsis.  Active problems  H/o sigmoid diverticulitis Her CT scan done in 921 showed small 4 cm well-defined diverticular abscess and sigmoid mesentery.  As she was not having any symptoms the surgery decided not to follow-up.  Patient states that she sometimes does have pain in her lower abdomen.  Her diarrhea has resolved and she has not had any fever or chills.  Appetite is good Weight is stable Chest wall pain Does complain of chest wall pain.  More on her left chest.  Does have a history of fibromyalgia but is allergic to Cymbalta Rheumatoid arthritis Follows with Dr. Estanislado Pandy.  Has not tolerated modifying drugs. She is using prednisone as  needed Continues to use tramadol as needed.   Past Medical History:  Diagnosis Date  . Adrenal failure ([place])   . Arthritis   . Asthma   . Cancer ([place])   . Cataract   . Closed nondisplaced fracture of fifth right metatarsal bone 09/18/2017  . Diverticulitis    Per patient  . Fibromyalgia 2008  . HCAP (healthcare-associated pneumonia) 02/03/2018  . Osteoporosis   . RA (rheumatoid arthritis) ([place])   . Recurrent upper respiratory infection (URI)   . Sepsis due to urinary tract infection ([place]) 01/18/2018  . Urticaria     Past Surgical History:  Procedure Laterality Date  . BACK SURGERY    . BILATERAL CARPAL TUNNEL RELEASE  2005   right and left  . CATARACT EXTRACTION, BILATERAL  2004   right and left  . CERVICAL FUSION  2011,2010,2008   2 disks  . HEEL SPUR SURGERY  2004  . lower back fusion  2011   Fusion of 3-4 and 4-5 lower back  . [place]  . ROTATOR CUFF REPAIR  [id number]  . SQUAMOUS CELL CARCINOMA EXCISION    . TONSILLECTOMY AND ADENOIDECTOMY  1947  . TOTAL SHOULDER ARTHROPLASTY      Allergies  Allergen Reactions  . Adhesive [Tape] Rash  . Celebrex [Celecoxib] Hives  . Ciprofibrate Nausea Only  . Cymbalta [Duloxetine Hcl] Swelling  . Gabitril [Tiagabine] Swelling  . Keflex [Cephalexin] Nausea And Vomiting  . Lyrica [Pregabalin] Swelling  . Neurontin [Gabapentin] Swelling  .  Nexium [Esomeprazole] Rash  . Nsaids Rash  . Penicillins Rash    Injection site reaction. Tolerated cefepime in past Has patient had a PCN reaction causing immediate rash, facial/tongue/throat swelling, SOB or lightheadedness with hypotension: No Has patient had a PCN reaction causing severe rash involving mucus membranes or skin necrosis: No Has patient had a PCN reaction that required hospitalization No Has patient had a PCN reaction occurring within the last 10 years: No If all of the above answers are "NO", then may proceed with Cephalosporin use.   . Shrimp  [Shellfish Allergy] Anaphylaxis    Per patient "shrimp only"  . Sulfa Antibiotics Nausea And Vomiting  . Azactam [Aztreonam]     Hand swelling   . Azelastine Hcl     Rash   . Ciprofloxacin Other (See Comments)    dizziness  . Claritin [Loratadine]     Irritability Nervousness   . Methotrexate Derivatives   . Nasacort [Triamcinolone]     Dizzy   . Olopatadine Other (See Comments)    Pain and lethargy   . Sulfamethizole Other (See Comments)    unknown  . Zantac [Ranitidine Hcl] Other (See Comments)    unknown  . Claritin-D 12 Hour [Loratadine-Pseudoephedrine Er] Anxiety    Outpatient Encounter Medications as of 10/15/2020  Medication Sig  . acetaminophen (TYLENOL) 500 MG tablet Take 500 mg by mouth every 6 (six) hours.   Marland Kitchen atorvastatin (LIPITOR) 20 MG tablet Take 1 tablet (20 mg total) by mouth daily.  . Biotin (BIOTIN MAXIMUM STRENGTH) 10 MG TABS Take 10 mg by mouth daily.  . Cholecalciferol (VITAMIN D3) 5000 units CAPS Take 5,000 Units by mouth daily.  Marland Kitchen CRANBERRY PO Take 1 capsule by mouth daily.  . cycloSPORINE (RESTASIS) 0.05 % ophthalmic emulsion Place 1 drop into both eyes 2 (two) times daily.   Marland Kitchen EPINEPHrine 0.3 mg/0.3 mL IJ SOAJ injection Inject 0.3 mg into the muscle as needed for anaphylaxis.   . fexofenadine (ALLEGRA) 180 MG tablet Take 180 mg by mouth daily.  . fluticasone (FLONASE) 50 MCG/ACT nasal spray SPRAY 2 SPRAYS INTO EACH NOSTRIL EVERY DAY  . furosemide (LASIX) 80 MG tablet Take 1 tablet (80 mg total) by mouth daily.  Marland Kitchen ketoconazole (NIZORAL) 2 % shampoo Apply 1 application topically once a week.   . lansoprazole (PREVACID) 30 MG capsule Take one capsule by mouth once daily at 12 noon.  Marland Kitchen MELATONIN PO Take 10 mg by mouth daily.   . montelukast (SINGULAIR) 10 MG tablet Take 1 tablet (10 mg total) by mouth at bedtime.  . potassium chloride (KLOR-CON) 10 MEQ tablet Take 2 tablets (20 mEq total) by mouth 2 (two) times daily.  . predniSONE (DELTASONE) 10 MG tablet  TAKE 1 TABLET (10 MG TOTAL) BY MOUTH DAILY WITH BREAKFAST.  Marland Kitchen Syringe/Needle, Disp, (SYRINGE 3CC/25GX1") 25G X 1" 3 ML MISC 1 application by Does not apply route every 30 (thirty) days.  . traMADol (ULTRAM) 50 MG tablet TAKE 1 TABLET BY MOUTH FOUR TIMES A DAY  . [DISCONTINUED] diclofenac Sodium (VOLTAREN) 1 % GEL APPLY 2 TO 4 GRAMS TOPICALLY TO AFFECTED JOINTS UP TO 4 TIMES DAILY   No facility-administered encounter medications on file as of 10/15/2020.    Review of Systems:  Review of Systems  Constitutional: Negative.   HENT: Negative.   Respiratory: Negative.   Cardiovascular: Positive for chest pain and leg swelling.  Gastrointestinal: Positive for abdominal distention.  Genitourinary: Negative.   Musculoskeletal: Positive for arthralgias, back  pain, gait problem, joint swelling and myalgias.  Skin: Negative.   Neurological: Positive for weakness.  Psychiatric/Behavioral: Positive for dysphoric mood.    Health Maintenance  Topic Date Due  . TETANUS/TDAP  Never done  . INFLUENZA VACCINE  Completed  . DEXA SCAN  Completed  . COVID-19 Vaccine  Completed  . PNA vac Low Risk Adult  Completed    Physical Exam: Vitals:   10/15/20 1041  BP: (!) 144/86  Pulse: 84  Temp: (!) 96.8 F (36 C)  SpO2: 95%  Weight: 175 lb 9.6 oz (79.7 kg)  Height: 5' 4.5" (1.638 m)   Body mass index is 29.68 kg/m. Physical Exam Vitals reviewed.  Constitutional:      Appearance: Normal appearance.  HENT:     Head: Normocephalic.     Nose: Nose normal.     Mouth/Throat:     Mouth: Mucous membranes are moist.     Pharynx: Oropharynx is clear.  Eyes:     Pupils: Pupils are equal, round, and reactive to light.  Cardiovascular:     Rate and Rhythm: Normal rate and regular rhythm.     Pulses: Normal pulses.     Comments: Pain in Left Chest wall Pulmonary:     Effort: Pulmonary effort is normal.     Breath sounds: Normal breath sounds.  Abdominal:     General: Abdomen is flat.      Palpations: Abdomen is soft.     Comments: Tender in Lower abdomen  Musculoskeletal:        General: No swelling.     Cervical back: Neck supple.  Skin:    General: Skin is warm and dry.  Neurological:     General: No focal deficit present.     Mental Status: She is alert and oriented to person, place, and time.  Psychiatric:        Mood and Affect: Mood normal.        Thought Content: Thought content normal.        Judgment: Judgment normal.     Labs reviewed: Basic Metabolic Panel: Recent Labs    04/06/20 0730 05/13/20 1123 06/28/20 1813 07/13/20 0710 07/19/20 1438 07/27/20 1107  NA 142   < > 143 141 143  --   K 4.0   < > 4.4 4.3 3.6  --   CL 103   < > 105 100 104  --   CO2 30   < > 27 27 30   --   GLUCOSE 91   < > 124* 87 76  --   BUN 26*   < > 9 23 22   --   CREATININE 1.05*   < > 0.63 1.14* 1.01*  --   CALCIUM 10.3   < > 9.4 10.1 10.2  --   TSH 3.16  --   --   --   --  2.04   < > = values in this interval not displayed.   Liver Function Tests: Recent Labs    06/23/20 1323 06/23/20 1323 06/25/20 0941 07/13/20 0710 07/19/20 1438  AST 18   < > 20 19 15   ALT 15   < > 14 13 11   ALKPHOS 49  --  43  --   --   BILITOT 1.2   < > 1.1 0.5 0.5  PROT 7.3   < > 6.3* 7.5 7.1  ALBUMIN 3.3*  --  2.7*  --   --    < > = values  in this interval not displayed.   Recent Labs    06/23/20 1323 07/19/20 1438  LIPASE 21 23   No results for input(s): AMMONIA in the last 8760 hours. CBC: Recent Labs    06/25/20 0849 06/26/20 0518 06/28/20 0542 07/13/20 0710 07/19/20 1438  WBC 10.9*   < > 9.4 13.7* 10.5  NEUTROABS 7.8*  --   --  8,234* 5,523  HGB 11.5*   < > 12.2 13.9 13.2  HCT 37.1   < > 38.5 42.0 40.5  MCV 102.2*   < > 98.7 95.0 96.4  PLT 256   < > 278 354 278   < > = values in this interval not displayed.   Lipid Panel: Recent Labs    04/06/20 0730  CHOL 176  HDL 67  LDLCALC 82  TRIG 171*  CHOLHDL 2.6   No results found for: HGBA1C  Procedures since  last visit: No results found.  Assessment/Plan  Diverticulitis of sigmoid colon  Abdominal distension (gaseous) - Plan: CT Abdomen Pelvis W Contrast  Bilateral lower extremity edema Continue on Lasix  Chest wall pain Ordered Chest Xray Tramadol prn  Rheumatoid arthritis involving multiple sites with positive rheumatoid factor ([place]) Follows with Rheumatology. Did not tolerate Plaquenil or Leflunomide Use Prednisone higher dose if needed Gastroesophageal reflux disease, unspecified whether esophagitis present On Prevacid Fibromyalgia Uses tramadol prn Cortisol Def Takes Prednisone 10 mg  Age-related osteoporosis without current pathological fracture On Reclast before per rheumatology Right now on drug holiday  Allergic conjunctivitis and rhinitis, bilateral On Allegra,fluticasone and Singulair B12 deficiency She is on monthly injections Hyperlipidemia On Statin ? Cyst in Left Adnexa on CT scan Will need [place] in 6 months Labs/tests ordered:  * No order type specified * Next appt:  Visit date not found

## 2020-10-16 ENCOUNTER — Other Ambulatory Visit: Payer: Self-pay | Admitting: Nurse Practitioner

## 2020-10-17 ENCOUNTER — Other Ambulatory Visit: Payer: Self-pay | Admitting: Physician Assistant

## 2020-10-17 ENCOUNTER — Other Ambulatory Visit: Payer: Self-pay | Admitting: Internal Medicine

## 2020-10-18 ENCOUNTER — Encounter: Payer: Self-pay | Admitting: Rheumatology

## 2020-10-18 DIAGNOSIS — M0579 Rheumatoid arthritis with rheumatoid factor of multiple sites without organ or systems involvement: Secondary | ICD-10-CM

## 2020-10-18 MED ORDER — DICLOFENAC SODIUM 1 % EX GEL: CUTANEOUS | 0 refills | Status: DC

## 2020-10-18 NOTE — Telephone Encounter (Signed)
Pharmacy requested refill Pended Rx and sent to Dr. Gupta for approval.  

## 2020-10-18 NOTE — Telephone Encounter (Signed)
Last Visit: 09/02/2020 Next Visit: 02/03/2021  Okay to refill per Dr. Estanislado Pandy

## 2020-10-21 ENCOUNTER — Other Ambulatory Visit: Payer: Self-pay | Admitting: Physician Assistant

## 2020-10-25 ENCOUNTER — Encounter: Payer: Self-pay | Admitting: Internal Medicine

## 2020-10-28 ENCOUNTER — Encounter: Payer: Self-pay | Admitting: Internal Medicine

## 2020-10-28 DIAGNOSIS — Z85828 Personal history of other malignant neoplasm of skin: Secondary | ICD-10-CM | POA: Diagnosis not present

## 2020-10-28 DIAGNOSIS — L820 Inflamed seborrheic keratosis: Secondary | ICD-10-CM | POA: Diagnosis not present

## 2020-10-28 DIAGNOSIS — L821 Other seborrheic keratosis: Secondary | ICD-10-CM | POA: Diagnosis not present

## 2020-10-29 MED ORDER — PREDNISONE 10 MG PO TABS
10.0000 mg | ORAL_TABLET | Freq: Every day | ORAL | 3 refills | Status: DC
Start: 2020-10-29 — End: 2021-02-18

## 2020-10-29 NOTE — Telephone Encounter (Signed)
This medication was Pended and routed to Dr. Lyndel Safe for approval on 10/18/2020.  Repended and sent for approval.

## 2020-11-03 ENCOUNTER — Encounter: Payer: Self-pay | Admitting: Internal Medicine

## 2020-11-07 ENCOUNTER — Other Ambulatory Visit: Payer: Self-pay | Admitting: Internal Medicine

## 2020-11-07 DIAGNOSIS — M797 Fibromyalgia: Secondary | ICD-10-CM

## 2020-11-07 DIAGNOSIS — M47816 Spondylosis without myelopathy or radiculopathy, lumbar region: Secondary | ICD-10-CM

## 2020-11-07 DIAGNOSIS — M0579 Rheumatoid arthritis with rheumatoid factor of multiple sites without organ or systems involvement: Secondary | ICD-10-CM

## 2020-11-08 NOTE — Telephone Encounter (Signed)
RX last filled in epic on 09/30/2020  Treatment agreement on file from 07/16/2020

## 2020-11-14 ENCOUNTER — Other Ambulatory Visit: Payer: Self-pay | Admitting: Rheumatology

## 2020-11-14 DIAGNOSIS — M0579 Rheumatoid arthritis with rheumatoid factor of multiple sites without organ or systems involvement: Secondary | ICD-10-CM

## 2020-11-15 ENCOUNTER — Ambulatory Visit
Admission: RE | Admit: 2020-11-15 | Discharge: 2020-11-15 | Disposition: A | Discharge status: Home / Self Care | Payer: Medicare PPO | Source: Ambulatory Visit | Attending: Internal Medicine | Admitting: Internal Medicine

## 2020-11-15 DIAGNOSIS — R140 Abdominal distension (gaseous): Secondary | ICD-10-CM

## 2020-11-15 DIAGNOSIS — L02211 Cutaneous abscess of abdominal wall: Secondary | ICD-10-CM | POA: Diagnosis not present

## 2020-11-15 MED ORDER — IOPAMIDOL (ISOVUE-300) INJECTION 61%
100.0000 mL | Freq: Once | INTRAVENOUS | Status: AC | PRN
  Administered 2020-11-15: 100 mL via INTRAVENOUS

## 2020-11-16 ENCOUNTER — Other Ambulatory Visit: Payer: Self-pay | Admitting: Physician Assistant

## 2020-12-07 ENCOUNTER — Other Ambulatory Visit: Payer: Self-pay | Admitting: Nurse Practitioner

## 2020-12-07 DIAGNOSIS — M797 Fibromyalgia: Secondary | ICD-10-CM

## 2020-12-07 DIAGNOSIS — M47816 Spondylosis without myelopathy or radiculopathy, lumbar region: Secondary | ICD-10-CM

## 2020-12-07 DIAGNOSIS — M0579 Rheumatoid arthritis with rheumatoid factor of multiple sites without organ or systems involvement: Secondary | ICD-10-CM

## 2020-12-17 ENCOUNTER — Other Ambulatory Visit: Payer: Self-pay | Admitting: Rheumatology

## 2020-12-17 DIAGNOSIS — M0579 Rheumatoid arthritis with rheumatoid factor of multiple sites without organ or systems involvement: Secondary | ICD-10-CM

## 2020-12-17 NOTE — Telephone Encounter (Signed)
Last Visit: 09/02/2020 Next Visit: 02/03/2021  Current Dose per office note on 09/02/2020, Voltaren gel not mentioned  Dx: Rheumatoid arthritis involving multiple sites with positive rheumatoid factor  Okay to refill Voltaren Gel?

## 2020-12-26 ENCOUNTER — Other Ambulatory Visit: Payer: Self-pay | Admitting: Internal Medicine

## 2020-12-26 DIAGNOSIS — K219 Gastro-esophageal reflux disease without esophagitis: Secondary | ICD-10-CM

## 2020-12-27 NOTE — Telephone Encounter (Signed)
Pharmacy requested refill.  °Pended Rx and sent to Dr. Gupta for approval due to HIGH ALERT Warning.  °

## 2020-12-30 ENCOUNTER — Other Ambulatory Visit: Payer: Self-pay | Admitting: Physician Assistant

## 2021-01-11 ENCOUNTER — Encounter: Payer: Self-pay | Admitting: Nurse Practitioner

## 2021-01-11 ENCOUNTER — Ambulatory Visit (INDEPENDENT_AMBULATORY_CARE_PROVIDER_SITE_OTHER): Payer: Medicare PPO | Admitting: Nurse Practitioner

## 2021-01-11 ENCOUNTER — Other Ambulatory Visit: Payer: Self-pay | Admitting: Nurse Practitioner

## 2021-01-11 ENCOUNTER — Telehealth: Payer: Self-pay

## 2021-01-11 ENCOUNTER — Other Ambulatory Visit: Payer: Self-pay

## 2021-01-11 DIAGNOSIS — Z0000 Encounter for general adult medical examination without abnormal findings: Secondary | ICD-10-CM | POA: Diagnosis not present

## 2021-01-11 DIAGNOSIS — E2839 Other primary ovarian failure: Secondary | ICD-10-CM

## 2021-01-11 NOTE — Patient Instructions (Signed)
Eileen Wilkerson , Thank you for taking time to come for your Medicare Wellness Visit. I appreciate your ongoing commitment to your health goals. Please review the following plan we discussed and let me know if I can assist you in the future.   Screening recommendations/referrals: Colonoscopy aged out Mammogram aged out Bone Density - DUE at this time. Call [phone] to schedule  Recommended yearly ophthalmology/optometry visit for glaucoma screening and checkup Recommended yearly dental visit for hygiene and checkup  Vaccinations: Influenza vaccine up to date Pneumococcal vaccine up to date Tdap vaccine RECOMMENDED- to get at local pharmacy  Shingles vaccine up to date   Advanced directives: on file   Conditions/risks identified: fall risk, advanced age.   Next appointment: yearly for AWV    Preventive Care 33 Years and Older, Female Preventive care refers to lifestyle choices and visits with your health care provider that can promote health and wellness. What does preventive care include?  A yearly physical exam. This is also called an annual well check.  Dental exams once or twice a year.  Routine eye exams. Ask your health care provider how often you should have your eyes checked.  Personal lifestyle choices, including:  Daily care of your teeth and gums.  Regular physical activity.  Eating a healthy diet.  Avoiding tobacco and drug use.  Limiting alcohol use.  Practicing safe sex.  Taking low-dose aspirin every day.  Taking vitamin and mineral supplements as recommended by your health care provider. What happens during an annual well check? The services and screenings done by your health care provider during your annual well check will depend on your age, overall health, lifestyle risk factors, and family history of disease. Counseling  Your health care provider may ask you questions about your:  Alcohol use.  Tobacco use.  Drug use.  Emotional  well-being.  Home and relationship well-being.  Sexual activity.  Eating habits.  History of falls.  Memory and ability to understand (cognition).  Work and work Statistician.  Reproductive health. Screening  You may have the following tests or measurements:  Height, weight, and BMI.  Blood pressure.  Lipid and cholesterol levels. These may be checked every 5 years, or more frequently if you are over 23 years old.  Skin check.  Lung cancer screening. You may have this screening every year starting at age 19 if you have a 30-pack-year history of smoking and currently smoke or have quit within the past 15 years.  Fecal occult blood test (FOBT) of the stool. You may have this test every year starting at age 38.  Flexible sigmoidoscopy or colonoscopy. You may have a sigmoidoscopy every 5 years or a colonoscopy every 10 years starting at age 40.  Hepatitis C blood test.  Hepatitis B blood test.  Sexually transmitted disease (STD) testing.  Diabetes screening. This is done by checking your blood sugar (glucose) after you have not eaten for a while (fasting). You may have this done every 1-3 years.  Bone density scan. This is done to screen for osteoporosis. You may have this done starting at age 74.  Mammogram. This may be done every 1-2 years. Talk to your health care provider about how often you should have regular mammograms. Talk with your health care provider about your test results, treatment options, and if necessary, the need for more tests. Vaccines  Your health care provider may recommend certain vaccines, such as:  Influenza vaccine. This is recommended every year.  Tetanus, diphtheria, and  acellular pertussis (Tdap, Td) vaccine. You may need a Td booster every 10 years.  Zoster vaccine. You may need this after age 39.  Pneumococcal 13-valent conjugate (PCV13) vaccine. One dose is recommended after age 19.  Pneumococcal polysaccharide (PPSV23) vaccine. One  dose is recommended after age 61. Talk to your health care provider about which screenings and vaccines you need and how often you need them. This information is not intended to replace advice given to you by your health care provider. Make sure you discuss any questions you have with your health care provider. Document Released: 12/24/2015 Document Revised: 08/16/2016 Document Reviewed: 09/28/2015 Elsevier Interactive Patient Education  2017 [employer] Prevention in the Home Falls can cause injuries. They can happen to people of all ages. There are many things you can do to make your home safe and to help prevent falls. What can I do on the outside of my home?  Regularly fix the edges of walkways and driveways and fix any cracks.  Remove anything that might make you trip as you walk through a door, such as a raised step or threshold.  Trim any bushes or trees on the path to your home.  Use bright outdoor lighting.  Clear any walking paths of anything that might make someone trip, such as rocks or tools.  Regularly check to see if handrails are loose or broken. Make sure that both sides of any steps have handrails.  Any raised decks and porches should have guardrails on the edges.  Have any leaves, snow, or ice cleared regularly.  Use sand or salt on walking paths during winter.  Clean up any spills in your garage right away. This includes oil or grease spills. What can I do in the bathroom?  Use night lights.  Install grab bars by the toilet and in the tub and shower. Do not use towel bars as grab bars.  Use non-skid mats or decals in the tub or shower.  If you need to sit down in the shower, use a plastic, non-slip stool.  Keep the floor dry. Clean up any water that spills on the floor as soon as it happens.  Remove soap buildup in the tub or shower regularly.  Attach bath mats securely with double-sided non-slip rug tape.  Do not have throw rugs and other  things on the floor that can make you trip. What can I do in the bedroom?  Use night lights.  Make sure that you have a light by your bed that is easy to reach.  Do not use any sheets or blankets that are too big for your bed. They should not hang down onto the floor.  Have a firm chair that has side arms. You can use this for support while you get dressed.  Do not have throw rugs and other things on the floor that can make you trip. What can I do in the kitchen?  Clean up any spills right away.  Avoid walking on wet floors.  Keep items that you use a lot in easy-to-reach places.  If you need to reach something above you, use a strong step stool that has a grab bar.  Keep electrical cords out of the way.  Do not use floor polish or wax that makes floors slippery. If you must use wax, use non-skid floor wax.  Do not have throw rugs and other things on the floor that can make you trip. What can I do with my stairs?  Do not leave any items on the stairs.  Make sure that there are handrails on both sides of the stairs and use them. Fix handrails that are broken or loose. Make sure that handrails are as long as the stairways.  Check any carpeting to make sure that it is firmly attached to the stairs. Fix any carpet that is loose or worn.  Avoid having throw rugs at the top or bottom of the stairs. If you do have throw rugs, attach them to the floor with carpet tape.  Make sure that you have a light switch at the top of the stairs and the bottom of the stairs. If you do not have them, ask someone to add them for you. What else can I do to help prevent falls?  Wear shoes that:  Do not have high heels.  Have rubber bottoms.  Are comfortable and fit you well.  Are closed at the toe. Do not wear sandals.  If you use a stepladder:  Make sure that it is fully opened. Do not climb a closed stepladder.  Make sure that both sides of the stepladder are locked into place.  Ask  someone to hold it for you, if possible.  Clearly mark and make sure that you can see:  Any grab bars or handrails.  First and last steps.  Where the edge of each step is.  Use tools that help you move around (mobility aids) if they are needed. These include:  Canes.  Walkers.  Scooters.  Crutches.  Turn on the lights when you go into a dark area. Replace any light bulbs as soon as they burn out.  Set up your furniture so you have a clear path. Avoid moving your furniture around.  If any of your floors are uneven, fix them.  If there are any pets around you, be aware of where they are.  Review your medicines with your doctor. Some medicines can make you feel dizzy. This can increase your chance of falling. Ask your doctor what other things that you can do to help prevent falls. This information is not intended to replace advice given to you by your health care provider. Make sure you discuss any questions you have with your health care provider. Document Released: 09/23/2009 Document Revised: 05/04/2016 Document Reviewed: 01/01/2015 Elsevier Interactive Patient Education  2017 [employer].

## 2021-01-11 NOTE — Progress Notes (Signed)
This service is provided via telemedicine  No vital signs collected/recorded due to the encounter was a telemedicine visit.   Location of patient (ex: home, work):  Home  Patient consents to a telephone visit: Yes, see encounter dated 01/11/2021  Location of the provider (ex: office, home):  [place]  Name of any referring provider:  Veleta Miners, MD  Names of all persons participating in the telemedicine service and their role in the encounter:  Sherrie Mustache, Nurse Practitioner, Carroll Kinds, CMA, and patient.   Time spent on call:  11 minutes with medical assistant

## 2021-01-11 NOTE — Telephone Encounter (Signed)
[name], sessa are scheduled for a virtual visit with your provider today.    Just as we do with appointments in the office, we must obtain your consent to participate.  Your consent will be active for this visit and any virtual visit you may have with one of our providers in the next 365 days.    If you have a MyChart account, I can also send a copy of this consent to you electronically.  All virtual visits are billed to your insurance company just like a traditional visit in the office.  As this is a virtual visit, video technology does not allow for your provider to perform a traditional examination.  This may limit your provider's ability to fully assess your condition.  If your provider identifies any concerns that need to be evaluated in person or the need to arrange testing such as labs, EKG, etc, we will make arrangements to do so.    Although advances in technology are sophisticated, we cannot ensure that it will always work on either your end or our end.  If the connection with a video visit is poor, we may have to switch to a telephone visit.  With either a video or telephone visit, we are not always able to ensure that we have a secure connection.   I need to obtain your verbal consent now.   Are you willing to proceed with your visit today?   Eileen Wilkerson has provided verbal consent on 01/11/2021 for a virtual visit (video or telephone).   Carroll Kinds, CMA 01/11/2021  1:17 PM

## 2021-01-11 NOTE — Progress Notes (Signed)
Subjective:   Ambermarie HADIA MINIER is a 87 y.o. female who presents for Medicare Annual (Subsequent) preventive examination.  Review of Systems    Cardiac Risk Factors include: advanced age (>64men, >38 women);sedentary lifestyle;dyslipidemia     Objective:    Today's Vitals   01/11/21 1329  PainSc: 6    There is no height or weight on file to calculate BMI.  Advanced Directives 01/11/2021 08/10/2020 06/23/2020 06/23/2020 04/09/2020 07/09/2018 02/03/2018  Does Patient Have a Medical Advance Directive? Yes Yes No No Yes Yes No  Type of Advance Directive Healthcare Power of [place];Living will -  Does patient want to make changes to medical advance directive? No - Patient declined No - Patient declined - - No - Patient declined No - Patient declined -  Copy of Tea in Chart? - Yes - validated most recent copy scanned in chart (See row information) - - Yes - validated most recent copy scanned in chart (See row information) No - copy requested -  Would patient like information on creating a medical advance directive? - - No - Patient declined - - - No - Patient declined    Current Medications (verified) Outpatient Encounter Medications as of 01/11/2021  Medication Sig  . acetaminophen (TYLENOL) 500 MG tablet Take 500 mg by mouth every 6 (six) hours.   Marland Kitchen atorvastatin (LIPITOR) 20 MG tablet Take 1 tablet (20 mg total) by mouth daily.  . Biotin 10 MG TABS Take 10 mg by mouth daily.  . Cholecalciferol (VITAMIN D3) 5000 units CAPS Take 5,000 Units by mouth daily.  Marland Kitchen CRANBERRY PO Take 1 capsule by mouth daily.  . cycloSPORINE (RESTASIS) 0.05 % ophthalmic emulsion Place 1 drop into both eyes 2 (two) times daily.   . diclofenac Sodium (VOLTAREN) 1 % GEL APPLY 2 TO 4 GRAMS TOPICALLY TO AFFECTED JOINTS UP TO 4 TIMES DAILY  . EPINEPHrine 0.3 mg/0.3 mL IJ SOAJ injection Inject 0.3  mg into the muscle as needed for anaphylaxis.   . fexofenadine (ALLEGRA) 180 MG tablet Take 180 mg by mouth daily.  . fluticasone (FLONASE) 50 MCG/ACT nasal spray SPRAY 2 SPRAYS INTO EACH NOSTRIL EVERY DAY  . furosemide (LASIX) 80 MG tablet Take 1 tablet (80 mg total) by mouth daily.  Marland Kitchen ketoconazole (NIZORAL) 2 % shampoo Apply 1 application topically once a week.   . lansoprazole (PREVACID) 30 MG capsule TAKE 1 CAPSULE BY MOUTH EVERY DAY AT 12 NOON  . MELATONIN PO Take 10 mg by mouth daily.   . montelukast (SINGULAIR) 10 MG tablet Take 1 tablet (10 mg total) by mouth at bedtime.  . potassium chloride (KLOR-CON) 10 MEQ tablet Take 2 tablets (20 mEq total) by mouth 2 (two) times daily.  . predniSONE (DELTASONE) 10 MG tablet Take 1 tablet (10 mg total) by mouth daily with breakfast.  . Syringe/Needle, Disp, (SYRINGE 3CC/25GX1") 25G X 1" 3 ML MISC 1 application by Does not apply route every 30 (thirty) days.  . traMADol (ULTRAM) 50 MG tablet TAKE 1 TABLET BY MOUTH FOUR TIMES A DAY   No facility-administered encounter medications on file as of 01/11/2021.    Allergies (verified) Adhesive [tape], Celebrex [celecoxib], Ciprofibrate, Cymbalta [duloxetine hcl], Gabitril [tiagabine], Keflex [cephalexin], Lyrica [pregabalin], Neurontin [gabapentin], Nexium [esomeprazole], Nsaids, Penicillins, Shrimp [shellfish allergy], Sulfa antibiotics, Azactam [aztreonam], Azelastine hcl, Ciprofloxacin, Claritin [loratadine], Methotrexate derivatives, Nasacort [triamcinolone], Olopatadine, Sulfamethizole, Zantac [ranitidine hcl], and  Claritin-d 12 hour [loratadine-pseudoephedrine er]   History: Past Medical History:  Diagnosis Date  . Adrenal failure ([place])   . Arthritis   . Asthma   . Cancer ([place])   . Cataract   . Closed nondisplaced fracture of fifth right metatarsal bone 09/18/2017  . Diverticulitis    Per patient  . Fibromyalgia 2008  . HCAP (healthcare-associated pneumonia) 02/03/2018  . Osteoporosis   . RA  (rheumatoid arthritis) ([place])   . Recurrent upper respiratory infection (URI)   . Sepsis due to urinary tract infection ([place]) 01/18/2018  . Urticaria    Past Surgical History:  Procedure Laterality Date  . BACK SURGERY    . BILATERAL CARPAL TUNNEL RELEASE  2005   right and left  . CATARACT EXTRACTION, BILATERAL  2004   right and left  . CERVICAL FUSION  2011,2010,2008   2 disks  . HEEL SPUR SURGERY  2004  . lower back fusion  2011   Fusion of 3-4 and 4-5 lower back  . [place]  . ROTATOR CUFF REPAIR  [id number]  . SQUAMOUS CELL CARCINOMA EXCISION    . TONSILLECTOMY AND ADENOIDECTOMY  1947  . TOTAL SHOULDER ARTHROPLASTY     Family History  Problem Relation Age of Onset  . Heart attack Maternal Grandmother   . Heart attack Paternal Grandfather   . Breast cancer Mother 17  . Diabetes Father   . Heart disease Father   . Congestive Heart Failure Father 19       Died from  . Allergic rhinitis Neg Hx   . Asthma Neg Hx   . Eczema Neg Hx   . Urticaria Neg Hx    Social History   Socioeconomic History  . Marital status: Widowed    Spouse name: Not on file  . Number of children: Not on file  . Years of education: Not on file  . Highest education level: Not on file  Occupational History  . Not on file  Tobacco Use  . Smoking status: Former Smoker    Packs/day: 0.75    Years: 10.00    Pack years: 7.50    Types: Cigarettes    Quit date: 1968    Years since quitting: 54.1  . Smokeless tobacco: Never Used  Vaping Use  . Vaping Use: Never used  Substance and Sexual Activity  . Alcohol use: No  . Drug use: No  . Sexual activity: Not Currently  Other Topics Concern  . Not on file  Social History Narrative      Diet:        Do you drink/ eat things with caffeine? Dr. Malachi Bonds 2/ day      Marital status: Widowed                              What year were you married ? 1953      Do you live in a house, apartment,assistred living, condo, trailer,  etc.)? Apartment      Is it one or more stories?       How many persons live in your home ? 1      Do you have any pets in your home ?(please list) No      Highest Level of education completed: PhD       Current or past profession: Transport planner, Education officer, museum, Special Educator       Do you exercise?   No  Type & how often       ADVANCED DIRECTIVES (Please bring copies)      Do you have a living will? Yes      Do you have a DNR form?                       If not, do you want to discuss one? Yes      Do you have signed POA?HPOA forms?                 If so, please bring to your appointment Tes      FUNCTIONAL STATUS- To be completed by Spouse / child / Staff       Do you have difficulty bathing or dressing yourself ? No      Do you have difficulty preparing food or eating ? No      Do you have difficulty managing your mediation ? No      Do you have difficulty managing your finances ? No      Do you have difficulty affording your medication ? No      Social Determinants of Radio broadcast assistant Strain: Not on file  Food Insecurity: Not on file  Transportation Needs: Not on file  Physical Activity: Not on file  Stress: Not on file  Social Connections: Not on file    Tobacco Counseling Counseling given: Not Answered   Clinical Intake:  Pre-visit preparation completed: No  Pain : 0-10 Pain Score: 6  Pain Type: Acute pain Pain Location: Neck Pain Orientation: Left Pain Radiating Towards: back Pain Descriptors / Indicators: Constant,Aching,Dull Pain Onset: 1 to 4 weeks ago Pain Frequency: Constant Pain Relieving Factors: heat, pain medication  Pain Relieving Factors: heat, pain medication  BMI - recorded: 28 Diabetes: No  How often do you need to have someone help you when you read instructions, pamphlets, or other written materials from your doctor or pharmacy?: 1 - Never  Diabetic?no         Activities of Daily  Living In your present state of health, do you have any difficulty performing the following activities: 01/11/2021 06/23/2020  Hearing? Y Y  Comment - use hearing aids  Vision? N N  Difficulty concentrating or making decisions? N N  Walking or climbing stairs? Y Y  Dressing or bathing? N Y  Doing errands, shopping? N N  Preparing Food and eating ? N -  Using the Toilet? N -  In the past six months, have you accidently leaked urine? Y -  Do you have problems with loss of bowel control? N -  Managing your Medications? N -  Managing your Finances? N -  Housekeeping or managing your Housekeeping? N -  Some recent data might be hidden    Patient Care Team: [name], MD as PCP - General (Internal Medicine)  Indicate any recent Medical Services you may have received from other than [facility] providers in the past year (date may be approximate).     Assessment:   This is a routine wellness examination for Briona.  Hearing/Vision screen  Hearing Screening   125Hz  250Hz  500Hz  1000Hz  2000Hz  3000Hz  4000Hz  6000Hz  8000Hz   Right ear:           Left ear:           Comments: Patient wears hearing aids.  Vision Screening Comments: Patient wears glasses. Patient has had recent eye exam. Patient sees [facility] Kathrin Penner at  [facility]  Dietary issues and exercise activities discussed:    Goals    . Increase physical activity     Working with physical therapist to continue to improve strength. Patient would like to be able to ambulate without using the cane    . Weight (lb) < 160 lb (72.6 kg)     Through healthy lifestyle      Depression Screen PHQ 2/9 Scores 01/11/2021 06/09/2019 12/30/2018 07/09/2018 07/09/2018 05/08/2017  PHQ - 2 Score 0 0 0 0 0 0  PHQ- 9 Score - 3 4 - 0 -    Fall Risk Fall Risk  01/11/2021 10/15/2020 08/10/2020 07/29/2020 07/22/2020  Falls in the past year? 0 0 1 0 0  Number falls in past yr: 0 0 0 0 0  Comment - - - - -  Injury with Fall? 0 - 0 - -  Comment -  - - - -  Risk Factor Category  - - - - -  Risk for fall due to : - - - - -  Risk for fall due to: Comment - - - - -  Follow up - - - - -    FALL RISK PREVENTION PERTAINING TO THE HOME:  Any stairs in or around the home? No  If so, are there any without handrails? No  Home free of loose throw rugs in walkways, pet beds, electrical cords, etc? Yes  Adequate lighting in your home to reduce risk of falls? Yes   ASSISTIVE DEVICES UTILIZED TO PREVENT FALLS:  Life alert? Yes  Use of a cane, walker or w/c? Yes  Grab bars in the bathroom? Yes  Shower chair or bench in shower? Yes  Elevated toilet seat or a handicapped toilet? No   TIMED UP AND GO:  Was the test performed? No .    Cognitive Function:     6CIT Screen 01/11/2021 07/09/2018  What Year? 0 points 0 points  What month? 0 points 0 points  What time? 0 points 0 points  Count back from 20 0 points 0 points  Months in reverse 0 points 0 points  Repeat phrase 0 points -  Total Score 0 -    Immunizations Immunization History  Administered Date(s) Administered  . Fluad Quad(high Dose 65+) 09/30/2020  . Influenza, High Dose Seasonal PF 08/16/2018, 09/08/2019, 09/24/2020  . Influenza,inj,Quad PF,6+ Mos 08/11/2016  . Influenza-Unspecified 11/12/2017  . Moderna Sars-Covid-2 Vaccination 12/15/2019, 01/12/2020, 10/19/2020  . PPD Test 03/01/2018  . Pneumococcal Conjugate-13 06/26/2017  . Pneumococcal Polysaccharide-23 12/11/2009  . Zoster Recombinat (Shingrix) 01/28/2019, 09/02/2019    TDAP status: Due, Education has been provided regarding the importance of this vaccine. Advised may receive this vaccine at local pharmacy or Health Dept. Aware to provide a copy of the vaccination record if obtained from local pharmacy or Health Dept. Verbalized acceptance and understanding.  Flu Vaccine status: Up to date  Pneumococcal vaccine status: Up to date  Covid-19 vaccine status: Completed vaccines  Qualifies for Shingles  Vaccine? Yes   Zostavax completed No   Shingrix Completed?: Yes  Screening Tests Health Maintenance  Topic Date Due  . TETANUS/TDAP  Never done  . INFLUENZA VACCINE  Completed  . DEXA SCAN  Completed  . COVID-19 Vaccine  Completed  . PNA vac Low Risk Adult  Completed    Health Maintenance  Health Maintenance Due  Topic Date Due  . TETANUS/TDAP  Never done    Colorectal cancer screening: No longer required.  Mammogram status: No longer required due to aged out.  Bone Density status: Completed 2019. Results reflect: Bone density results: OSTEOPENIA. Repeat every 2 years.  Lung Cancer Screening: (Low Dose CT Chest recommended if Age 70-80 years, 30 pack-year currently smoking OR have quit w/in 15years.) does not qualify.   Lung Cancer Screening Referral: na  Additional Screening:  Hepatitis C Screening: does not qualify  Vision Screening: Recommended annual ophthalmology exams for early detection of glaucoma and other disorders of the eye. Is the patient up to date with their annual eye exam?  Yes  Who is the provider or what is the name of the office in which the patient attends annual eye exams? [facility]  If pt is not established with a provider, would they like to be referred to a provider to establish care? No .   Dental Screening: Recommended annual dental exams for proper oral hygiene  Community Resource Referral / Chronic Care Management: CRR required this visit?  No   CCM required this visit?  No      Plan:     I have personally reviewed and noted the following in the patient's chart:   . Medical and social history . Use of alcohol, tobacco or illicit drugs  . Current medications and supplements . Functional ability and status . Nutritional status . Physical activity . Advanced directives . List of other physicians . Hospitalizations, surgeries, and ER visits in previous 12 months . Vitals . Screenings to include cognitive, depression, and  falls . Referrals and appointments  In addition, I have reviewed and discussed with patient certain preventive protocols, quality metrics, and best practice recommendations. A written personalized care plan for preventive services as well as general preventive health recommendations were provided to patient.     Lauree Chandler, NP   01/11/2021    Virtual Visit via Telephone Note  I connected with@ on 01/11/21 at  1:00 PM EST by telephone and verified that I am speaking with the correct person using two identifiers.  Location: Patient: home Provider: [facility]   I discussed the limitations, risks, security and privacy concerns of performing an evaluation and management service by telephone and the availability of in person appointments. I also discussed with the patient that there may be a patient responsible charge related to this service. The patient expressed understanding and agreed to proceed.   I discussed the assessment and treatment plan with the patient. The patient was provided an opportunity to ask questions and all were answered. The patient agreed with the plan and demonstrated an understanding of the instructions.   The patient was advised to call back or seek an in-person evaluation if the symptoms worsen or if the condition fails to improve as anticipated.  I provided 18 minutes of non-face-to-face time during this encounter.  Carlos American. Harle Battiest Avs printed and mailed

## 2021-01-21 NOTE — Progress Notes (Signed)
Office Visit Note  Patient: Eileen Wilkerson             Date of Birth: [birth year]           MRN: [id number]             PCP: [name], MD Referring: [name], MD Visit Date: 02/03/2021 Occupation: @GUAROCC @  Subjective:  Pain in both hands  History of Present Illness: Eileen Wilkerson is a 87 y.o. female with history of seropositive rheumatoid arthritis, osteoarthritis, osteoporosis, and fibromyalgia.  She is taking prednisone 10 mg by mouth daily prescribed by Dr. Lyndel Safe. She has been unable to taper off of prednisone. She is not taking any immunosuppressive agents at this time. She denies any recent rheumatoid arthritis flares. She states that she continues to experience intermittent pain in both hands but denies any joint swelling. She states that she was having some increased discomfort in her left hand and wrist the other day and wore her carpal tunnel splint and states that the immobilization alleviated her discomfort. She continues to use Voltaren gel topically as needed for pain relief. She has ongoing discomfort due to chronic neck pain and trochanteric bursitis of both hips. She denies any symptoms of radiculopathy. She uses a heating pad for symptomatic relief. She has been taking tramadol 50 mg 1 tablet by mouth four times daily as needed for pain relief. She continues to take melatonin 10 mg by mouth at bedtime for insomnia. She denies any recent falls or fractures.  She walks with a cane to assist with ambulation. She continues to take vitamin D supplement on a daily basis. She has an upcoming DEXA scheduled in June 2022.       Activities of Daily Living:  Patient reports morning stiffness for  2  hours.   Patient Denies nocturnal pain.  Difficulty dressing/grooming: Denies Difficulty climbing stairs: Denies Difficulty getting out of chair: Denies Difficulty using hands for taps, buttons, cutlery, and/or writing: Reports  Review of Systems   Constitutional: Negative for fatigue.  HENT: Positive for mouth dryness. Negative for mouth sores and nose dryness.   Eyes: Negative for pain, itching and dryness.  Respiratory: Negative for shortness of breath and difficulty breathing.   Cardiovascular: Negative for chest pain and palpitations.  Gastrointestinal: Negative for blood in stool, constipation and diarrhea.  Endocrine: Negative for increased urination.  Genitourinary: Negative for difficulty urinating.  Musculoskeletal: Positive for arthralgias, joint pain and morning stiffness. Negative for joint swelling, myalgias, muscle tenderness and myalgias.  Skin: Negative for color change, rash and redness.  Allergic/Immunologic: Negative for susceptible to infections.  Neurological: Negative for dizziness, numbness, headaches, memory loss and weakness.  Hematological: Positive for bruising/bleeding tendency.  Psychiatric/Behavioral: Negative for confusion.    PMFS History:  Patient Active Problem List   Diagnosis Date Noted  . UTI (urinary tract infection) 07/22/2020  . Allergic rhinitis 07/22/2020  . Asthenia 07/08/2020  . Perforation of sigmoid colon due to diverticulitis 06/23/2020  . Hair loss 08/06/2019  . SOB (shortness of breath) on exertion 08/06/2019  . Pure hypercholesterolemia 08/06/2019  . B12 deficiency 03/11/2018  . E. coli UTI 01/18/2018  . Edema of both lower extremities due to peripheral venous insufficiency 09/18/2017  . Bilateral lower extremity edema 06/30/2017  . Physical deconditioning 05/30/2017  . Immunosuppressed status ([place]) 05/30/2017  . Abnormal chest x-ray 05/17/2017  . Fibromyalgia 12/18/2016  . DJD (degenerative joint disease), cervical 12/18/2016  . Spondylosis of lumbar region without  myelopathy or radiculopathy 12/18/2016  . Trigger finger, right middle finger 12/18/2016  . Primary osteoarthritis of both feet 12/18/2016  . Primary osteoarthritis of both knees 12/18/2016  . GERD  (gastroesophageal reflux disease) 12/18/2016  . Age-related osteoporosis without current pathological fracture 12/18/2016  . Rheumatoid arthritis ([place]) 12/28/2015  . Long term current use of systemic steroids, for RA 12/28/2015  . Lymphocytosis 11/13/2012    Past Medical History:  Diagnosis Date  . Adrenal failure ([place])   . Arthritis   . Asthma   . Cancer ([place])   . Cataract   . Closed nondisplaced fracture of fifth right metatarsal bone 09/18/2017  . Diverticulitis    Per patient  . Fibromyalgia 2008  . HCAP (healthcare-associated pneumonia) 02/03/2018  . Osteoporosis   . RA (rheumatoid arthritis) ([place])   . Recurrent upper respiratory infection (URI)   . Sepsis due to urinary tract infection ([place]) 01/18/2018  . Urticaria     Family History  Problem Relation Age of Onset  . Heart attack Maternal Grandmother   . Heart attack Paternal Grandfather   . Breast cancer Mother 69  . Diabetes Father   . Heart disease Father   . Congestive Heart Failure Father 61       Died from  . Allergic rhinitis Neg Hx   . Asthma Neg Hx   . Eczema Neg Hx   . Urticaria Neg Hx    Past Surgical History:  Procedure Laterality Date  . BACK SURGERY    . BILATERAL CARPAL TUNNEL RELEASE  2005   right and left  . CATARACT EXTRACTION, BILATERAL  2004   right and left  . CERVICAL FUSION  2011,2010,2008   2 disks  . HEEL SPUR SURGERY  2004  . lower back fusion  2011   Fusion of 3-4 and 4-5 lower back  . [place]  . ROTATOR CUFF REPAIR  [id number]  . SQUAMOUS CELL CARCINOMA EXCISION    . TONSILLECTOMY AND ADENOIDECTOMY  1947  . TOTAL SHOULDER ARTHROPLASTY     Social History   Social History Narrative      Diet:        Do you drink/ eat things with caffeine? Dr. Malachi Bonds 2/ day      Marital status: Widowed                              What year were you married ? 1953      Do you live in a house, apartment,assistred living, condo, trailer, etc.)? Apartment      Is it one  or more stories?       How many persons live in your home ? 1      Do you have any pets in your home ?(please list) No      Highest Level of education completed: PhD       Current or past profession: Transport planner, Education officer, museum, Special Educator       Do you exercise?   No                           Type & how often       ADVANCED DIRECTIVES (Please bring copies)      Do you have a living will? Yes      Do you have a DNR form?  If not, do you want to discuss one? Yes      Do you have signed POA?HPOA forms?                 If so, please bring to your appointment Tes      FUNCTIONAL STATUS- To be completed by Spouse / child / Staff       Do you have difficulty bathing or dressing yourself ? No      Do you have difficulty preparing food or eating ? No      Do you have difficulty managing your mediation ? No      Do you have difficulty managing your finances ? No      Do you have difficulty affording your medication ? No      Immunization History  Administered Date(s) Administered  . Fluad Quad(high Dose 65+) 09/30/2020  . Influenza, High Dose Seasonal PF 08/16/2018, 09/08/2019, 09/24/2020  . Influenza,inj,Quad PF,6+ Mos 08/11/2016  . Influenza-Unspecified 11/12/2017  . Moderna Sars-Covid-2 Vaccination 12/15/2019, 01/12/2020, 10/19/2020  . PPD Test 03/01/2018  . Pneumococcal Conjugate-13 06/26/2017  . Pneumococcal Polysaccharide-23 12/11/2009  . Zoster Recombinat (Shingrix) 01/28/2019, 09/02/2019     Objective: Vital Signs: BP 122/81 (BP Location: Left Arm, Patient Position: Sitting, Cuff Size: Normal)   Pulse 72   Resp 16   Ht 5' 4.5" (1.638 m)   Wt 172 lb 12.8 oz (78.4 kg)   BMI 29.20 kg/m    Physical Exam Vitals and nursing note reviewed.  Constitutional:      Appearance: She is well-developed and well-nourished.  HENT:     Head: Normocephalic and atraumatic.  Eyes:     Extraocular Movements: EOM normal.     Conjunctiva/sclera:  Conjunctivae normal.  Cardiovascular:     Pulses: Intact distal pulses.  Pulmonary:     Effort: Pulmonary effort is normal.  Abdominal:     Palpations: Abdomen is soft.  Musculoskeletal:     Cervical back: Normal range of motion.  Skin:    General: Skin is warm and dry.     Capillary Refill: Capillary refill takes less than 2 seconds.  Neurological:     Mental Status: She is alert and oriented to person, place, and time.  Psychiatric:        Mood and Affect: Mood and affect normal.        Behavior: Behavior normal.      Musculoskeletal Exam: C-spine limited ROM.  Postural thoracic kyphosis.  No midline spinal tenderness.  Shoulder joints, elbow joints, wrist joints, MCPs, PIPs, and DIPs good ROM with no synovitis.  PIP and DIP thickening noted in both hands.  Knee joints have good ROM with no discomfort.  No warmth or effusion of knee joints noted.  Ankle joints good ROM with no tenderness or swelling.   CDAI Exam: CDAI Score: 0.8  Patient Global: 6 mm; Provider Global: 2 mm Swollen: 0 ; Tender: 0  Joint Exam 02/03/2021   No joint exam has been documented for this visit   There is currently no information documented on the homunculus. Go to the Rheumatology activity and complete the homunculus joint exam.  Investigation: No additional findings.  Imaging: No results found.  Recent Labs: Lab Results  Component Value Date   WBC 10.5 07/19/2020   HGB 13.2 07/19/2020   PLT 278 07/19/2020   NA 143 07/19/2020   K 3.6 07/19/2020   CL 104 07/19/2020   CO2 30 07/19/2020   GLUCOSE 76 07/19/2020  BUN 22 07/19/2020   CREATININE 1.01 (H) 07/19/2020   BILITOT 0.5 07/19/2020   ALKPHOS 43 06/25/2020   AST 15 07/19/2020   ALT 11 07/19/2020   PROT 7.1 07/19/2020   ALBUMIN 2.7 (L) 06/25/2020   CALCIUM 10.2 07/19/2020   GFRAA 59 (L) 07/19/2020    Speciality Comments: PLQ eye exam:10/22/18 Normal. [facility] Follow up in 6 months. Prior therapy: MTX (recurrent  infections) Therapy Contraindications: Anti-TNF's due to CHF  Procedures:  No procedures performed Allergies: Adhesive [tape], Celebrex [celecoxib], Ciprofibrate, Cymbalta [duloxetine hcl], Gabitril [tiagabine], Keflex [cephalexin], Lyrica [pregabalin], Neurontin [gabapentin], Nexium [esomeprazole], Nsaids, Penicillins, Shrimp [shellfish allergy], Sulfa antibiotics, Azactam [aztreonam], Azelastine hcl, Ciprofloxacin, Claritin [loratadine], Methotrexate derivatives, Nasacort [triamcinolone], Olopatadine, Sulfamethizole, Zantac [ranitidine hcl], and Claritin-d 12 hour [loratadine-pseudoephedrine er]      Assessment / Plan:     Visit Diagnoses: Rheumatoid arthritis involving multiple sites with positive rheumatoid factor ([place]): She has no joint tenderness or synovitis on exam.  She has not had any recent rheumatoid arthritis flares.  She has occasional discomfort in both hands and both wrist joints but has not noticed any joint swelling.  She has been taking prednisone 10 mg 1 tablet by mouth daily prescribed by Dr. Lyndel Safe.  She has been unsuccessful at trying to taper prednisone.  We discussed the importance of joint protection and muscle strengthening.  We also discussed the use of arthritis compression gloves.  She will continue taking tramadol 50 mg 1 tablet by mouth 4 times daily as needed for pain relief.  She was advised to notify us if she develops increased joint pain or joint swelling.  She will follow-up in the office in 5 months.  High risk medication use - Prednisone 10 mg 1 tablet by mouth daily- hx of adrenal insufficiency. D/c PLQ-Sept 2020-hair loss. D/c MTX in 01/19-infections.  Leflunomide discontinued due to side effects. CBC and CMP updated on 07/19/20.  CBC and CMP updated today. Discussed repeating lab work every 6 months while on prednisone.  - Plan: CBC with Differential/Platelet, COMPLETE METABOLIC PANEL WITH GFR, COMPLETE METABOLIC PANEL WITH GFR, CBC with  Differential/Platelet  Primary osteoarthritis of both hands: She has PIP and DIP thickening consistent with osteoarthritis of both hands. We discussed the importance of joint protection and muscle strengthening. She has no joint tenderness or synovitis on exam. She was able to make a complete fist bilaterally. We discussed the use of arthritis compression gloves. She plans to continue to use her carpal tunnel splint for support and immobilization when she is having increased discomfort.   Primary osteoarthritis of both knees: She has good ROM of both knee joints with no discomfort.  No warmth or effusion noted.  She walks with a cane to assist with ambulation.  She uses voltaren gel topically as needed for pain relief.   Primary osteoarthritis of both feet: Mild osteoarthritic changes noted.  Discussed the importance of wearing proper fitting shoes and toe spacers if necessary.   Age-related osteoporosis without current pathological fracture: DEXA updated on 10/27/18: The BMD measured at Femur Neck Left is 0.807 g/cm2 with a T-score of -1.7.  She is scheduled for an updated DEXA on 06/06/21. She is taking vitamin D 5,000 units daily.  She has not had any recent falls or fractures.  She uses a cane to assist with ambulation.   Fibromyalgia: She experiences intermittent myalgias and muscle tenderness due to fibromyalgia.  She takes tramadol 50 mg 1 tablet by mouth 4 times daily as needed  for pain relief.  She continues to have chronic fatigue secondary to insomnia.  She has been taking melatonin 10 mg by mouth at bedtime.  We discussed the importance of regular exercise and good sleep hygiene.  DDD (degenerative disc disease), cervical: Chronic pain. She has limited range of motion with lateral rotation bilaterally. No symptoms of radiculopathy. She uses a heating pad as needed for pain relief. She has also been taking tramadol 50 mg 1 tablet four times daily as needed for pain.  History of fatigue:  Stable.   Other medical conditions are listed as follows:   Adrenal insufficiency ([place]): She is taking long term prednisone 10 mg by mouth daily.   History of CHF (congestive heart failure)  Orders: Orders Placed This Encounter  Procedures  . CBC with Differential/Platelet  . COMPLETE METABOLIC PANEL WITH GFR   No orders of the defined types were placed in this encounter.    Follow-Up Instructions: Return in about 5 months (around 07/03/2021) for Rheumatoid arthritis, Osteoporosis, Fibromyalgia.   Ofilia Neas, PA-C  Note - This record has been created using Dragon software.  Chart creation errors have been sought, but may not always  have been located. Such creation errors do not reflect on  the standard of medical care.

## 2021-02-03 ENCOUNTER — Ambulatory Visit: Payer: Medicare PPO | Admitting: Physician Assistant

## 2021-02-03 ENCOUNTER — Encounter: Payer: Self-pay | Admitting: Physician Assistant

## 2021-02-03 ENCOUNTER — Other Ambulatory Visit: Payer: Self-pay

## 2021-02-03 VITALS — BP 122/81 | HR 72 | Resp 16 | Ht 64.5 in | Wt 172.8 lb

## 2021-02-03 DIAGNOSIS — M19072 Primary osteoarthritis, left ankle and foot: Secondary | ICD-10-CM

## 2021-02-03 DIAGNOSIS — M810 Age-related osteoporosis without current pathological fracture: Secondary | ICD-10-CM

## 2021-02-03 DIAGNOSIS — M0579 Rheumatoid arthritis with rheumatoid factor of multiple sites without organ or systems involvement: Secondary | ICD-10-CM

## 2021-02-03 DIAGNOSIS — M19042 Primary osteoarthritis, left hand: Secondary | ICD-10-CM

## 2021-02-03 DIAGNOSIS — M797 Fibromyalgia: Secondary | ICD-10-CM

## 2021-02-03 DIAGNOSIS — Z87898 Personal history of other specified conditions: Secondary | ICD-10-CM

## 2021-02-03 DIAGNOSIS — M19071 Primary osteoarthritis, right ankle and foot: Secondary | ICD-10-CM | POA: Diagnosis not present

## 2021-02-03 DIAGNOSIS — M170 Bilateral primary osteoarthritis of knee: Secondary | ICD-10-CM

## 2021-02-03 DIAGNOSIS — M5030 Other cervical disc degeneration, unspecified cervical region: Secondary | ICD-10-CM

## 2021-02-03 DIAGNOSIS — M19041 Primary osteoarthritis, right hand: Secondary | ICD-10-CM

## 2021-02-03 DIAGNOSIS — E2740 Unspecified adrenocortical insufficiency: Secondary | ICD-10-CM

## 2021-02-03 DIAGNOSIS — Z8679 Personal history of other diseases of the circulatory system: Secondary | ICD-10-CM

## 2021-02-03 DIAGNOSIS — Z79899 Other long term (current) drug therapy: Secondary | ICD-10-CM | POA: Diagnosis not present

## 2021-02-04 LAB — CBC WITH DIFFERENTIAL/PLATELET
Absolute Monocytes: 810 cells/uL (ref 200–950)
Basophils Absolute: 75 cells/uL (ref 0–200)
Basophils Relative: 0.5 %
Eosinophils Absolute: 60 cells/uL (ref 15–500)
Eosinophils Relative: 0.4 %
HCT: 41 % (ref 35.0–45.0)
Hemoglobin: 13.5 g/dL (ref 11.7–15.5)
Lymphs Abs: 1410 cells/uL (ref 850–3900)
MCH: 30.9 pg (ref 27.0–33.0)
MCHC: 32.9 g/dL (ref 32.0–36.0)
MCV: 93.8 fL (ref 80.0–100.0)
MPV: 10.2 fL (ref 7.5–12.5)
Monocytes Relative: 5.4 %
Neutro Abs: 12645 cells/uL — ABNORMAL HIGH (ref 1500–7800)
Neutrophils Relative %: 84.3 %
Platelets: 272 10*3/uL (ref 140–400)
RBC: 4.37 10*6/uL (ref 3.80–5.10)
RDW: 12.5 % (ref 11.0–15.0)
Total Lymphocyte: 9.4 %
WBC: 15 10*3/uL — ABNORMAL HIGH (ref 3.8–10.8)

## 2021-02-04 LAB — COMPLETE METABOLIC PANEL WITH GFR
AG Ratio: 1.5 (calc) (ref 1.0–2.5)
ALT: 11 U/L (ref 6–29)
AST: 14 U/L (ref 10–35)
Albumin: 4.2 g/dL (ref 3.6–5.1)
Alkaline phosphatase (APISO): 52 U/L (ref 37–153)
BUN/Creatinine Ratio: 22 (calc) (ref 6–22)
BUN: 23 mg/dL (ref 7–25)
CO2: 33 mmol/L — ABNORMAL HIGH (ref 20–32)
Calcium: 9.8 mg/dL (ref 8.6–10.4)
Chloride: 100 mmol/L (ref 98–110)
Creat: 1.05 mg/dL — ABNORMAL HIGH (ref 0.60–0.88)
GFR, Est African American: 56 mL/min/{1.73_m2} — ABNORMAL LOW (ref >=60)
GFR, Est Non African American: 48 mL/min/{1.73_m2} — ABNORMAL LOW (ref >=60)
Globulin: 2.8 g/dL (calc) (ref 1.9–3.7)
Glucose, Bld: 103 mg/dL — ABNORMAL HIGH (ref 65–99)
Potassium: 4.3 mmol/L (ref 3.5–5.3)
Sodium: 141 mmol/L (ref 135–146)
Total Bilirubin: 0.6 mg/dL (ref 0.2–1.2)
Total Protein: 7 g/dL (ref 6.1–8.1)

## 2021-02-04 NOTE — Progress Notes (Signed)
Creatinine is borderline elevated-1.05. GFR is slightly low-48. Please advise the patient to avoid taking NSAIDs.   WBC count and absolute neutrophils are elevated.  She remains on long term prednisone 10 mg daily.   She has not had any recent infections. Recommend continuing to check lab work every 6 months.

## 2021-02-09 ENCOUNTER — Other Ambulatory Visit: Payer: Self-pay | Admitting: Internal Medicine

## 2021-02-09 DIAGNOSIS — J309 Allergic rhinitis, unspecified: Secondary | ICD-10-CM

## 2021-02-09 DIAGNOSIS — H1013 Acute atopic conjunctivitis, bilateral: Secondary | ICD-10-CM

## 2021-02-17 ENCOUNTER — Encounter: Payer: Self-pay | Admitting: Nurse Practitioner

## 2021-02-18 ENCOUNTER — Other Ambulatory Visit: Payer: Self-pay | Admitting: Internal Medicine

## 2021-02-18 DIAGNOSIS — R600 Localized edema: Secondary | ICD-10-CM

## 2021-02-18 NOTE — Telephone Encounter (Signed)
Pharmacy requested refills.  Pended Rxs and sent to [facility] for approval due to [place]. (Dr. Lyndel Safe out of office)

## 2021-03-03 ENCOUNTER — Other Ambulatory Visit: Payer: Self-pay

## 2021-03-03 ENCOUNTER — Non-Acute Institutional Stay: Payer: Medicare PPO | Admitting: Nurse Practitioner

## 2021-03-03 ENCOUNTER — Encounter: Payer: Self-pay | Admitting: Nurse Practitioner

## 2021-03-03 DIAGNOSIS — M0579 Rheumatoid arthritis with rheumatoid factor of multiple sites without organ or systems involvement: Secondary | ICD-10-CM | POA: Diagnosis not present

## 2021-03-03 DIAGNOSIS — M810 Age-related osteoporosis without current pathological fracture: Secondary | ICD-10-CM | POA: Diagnosis not present

## 2021-03-03 DIAGNOSIS — K5720 Diverticulitis of large intestine with perforation and abscess without bleeding: Secondary | ICD-10-CM

## 2021-03-03 DIAGNOSIS — E538 Deficiency of other specified B group vitamins: Secondary | ICD-10-CM

## 2021-03-03 DIAGNOSIS — I872 Venous insufficiency (chronic) (peripheral): Secondary | ICD-10-CM | POA: Diagnosis not present

## 2021-03-03 DIAGNOSIS — N949 Unspecified condition associated with female genital organs and menstrual cycle: Secondary | ICD-10-CM | POA: Diagnosis not present

## 2021-03-03 DIAGNOSIS — L570 Actinic keratosis: Secondary | ICD-10-CM | POA: Diagnosis not present

## 2021-03-03 DIAGNOSIS — K219 Gastro-esophageal reflux disease without esophagitis: Secondary | ICD-10-CM

## 2021-03-03 DIAGNOSIS — J45909 Unspecified asthma, uncomplicated: Secondary | ICD-10-CM | POA: Diagnosis not present

## 2021-03-03 NOTE — Assessment & Plan Note (Signed)
See dermatology

## 2021-03-03 NOTE — Assessment & Plan Note (Addendum)
Asthma Epinephrine prn, Allegra, Flonase, Singulair 

## 2021-03-03 NOTE — Assessment & Plan Note (Addendum)
Takes Vit B12 inj, repeat Vit B12 level.

## 2021-03-03 NOTE — Assessment & Plan Note (Signed)
Reclast per rheumatology

## 2021-03-03 NOTE — Progress Notes (Signed)
Location:   clinic [facility]   Place of Service:  Clinic (12) Provider: Marlana Latus NP  Code Status: DNR Goals of Care: [place] 01/11/2021  Does Patient Have a Medical Advance Directive? Yes  Type of Advance Directive [place]  Does patient want to make changes to medical advance directive? No - Patient declined  Copy of [place] in Chart? -  Would patient like information on creating a medical advance directive? -     Chief Complaint  Patient presents with  . Medical Management of Chronic Issues    Patient returns to the clinic for her 4 month follow up. She would like to discuss her leg pain, difficuty walking, left arm growth, back issues.    HPI: Patient is a 87 y.o. female seen today for medical management of chronic diseases.    Hx of aute sigmoid diverticulitis with pneumoperitoneum, treated with  Omnicef/Falgyl7/2021,f/u Dr. [name] Asthma Epinephrine prn, Allegra, Flonase, Singulair RA/fibromyalgia,  takes Prednisone, Tramadol, TSH 2.04 07/27/20, wbc 15.0 02/03/21 GERD Prevacid 36m qd, Hgb 13.5 02/03/21 Venous insufficiency/Edema , takes Furosemide 860mqd, Kcl. Bun/creat 23/1.05 02/03/21  OP Reclast per rheumatology  Vit B12 deficiency, Vit B12 inj  CT left adnexa cyst, f/u [place].    Past Medical History:  Diagnosis Date  . Adrenal failure ([facility]  . Arthritis   . Asthma   . Cancer ([facility]  . Cataract   . Closed nondisplaced fracture of fifth right metatarsal bone 09/18/2017  . Diverticulitis    Per patient  . Fibromyalgia 2008  . HCAP (healthcare-associated pneumonia) 02/03/2018  . Osteoporosis   . RA (rheumatoid arthritis) ([facility]  . Recurrent upper respiratory infection (URI)   . Sepsis due to urinary tract infection (HCM[facility]o2/07/2018  . Urticaria     Past Surgical History:  Procedure Laterality Date  . BACK SURGERY    . BILATERAL CARPAL TUNNEL RELEASE   2005   right and left  . CATARACT EXTRACTION, BILATERAL  2004   right and left  . CERVICAL FUSION  2011,2010,2008   2 disks  . HEEL SPUR SURGERY  2004  . lower back fusion  2011   Fusion of 3-4 and 4-5 lower back  [place] R[place] CUFF REPAIR  [id number]. SQUAMOUS CELL CARCINOMA EXCISION    . TONSILLECTOMY AND ADENOIDECTOMY  1947  . TOTAL SHOULDER ARTHROPLASTY      Allergies  Allergen Reactions  . Adhesive [Tape] Rash  . Celebrex [Celecoxib] Hives  . Ciprofibrate Nausea Only  . Cymbalta [Duloxetine Hcl] Swelling  . Gabitril [Tiagabine] Swelling  . Keflex [Cephalexin] Nausea And Vomiting  . Lyrica [Pregabalin] Swelling  . Neurontin [Gabapentin] Swelling  . Nexium [Esomeprazole] Rash  . Nsaids Rash  . Penicillins Rash    Injection site reaction. Tolerated cefepime in past Has patient had a PCN reaction causing immediate rash, facial/tongue/throat swelling, SOB or lightheadedness with hypotension: No Has patient had a PCN reaction causing severe rash involving mucus membranes or skin necrosis: No Has patient had a PCN reaction that required hospitalization No Has patient had a PCN reaction occurring within the last 10 years: No If all of the above answers are "NO", then may proceed with Cephalosporin use.   . Shrimp [Shellfish Allergy] Anaphylaxis    Per patient "shrimp only"  . Sulfa Antibiotics Nausea And Vomiting  . Azactam [Aztreonam]     Hand swelling   .  Azelastine Hcl     Rash   . Ciprofloxacin Other (See Comments)    dizziness  . Claritin [Loratadine]     Irritability Nervousness   . Methotrexate Derivatives   . Nasacort [Triamcinolone]     Dizzy   . Olopatadine Other (See Comments)    Pain and lethargy   . Sulfamethizole Other (See Comments)    unknown  . Zantac [Ranitidine Hcl] Other (See Comments)    unknown  . Claritin-D 12 Hour [Loratadine-Pseudoephedrine Er] Anxiety    Allergies as of 03/03/2021      Reactions    Adhesive [tape] Rash   Celebrex [celecoxib] Hives   Ciprofibrate Nausea Only   Cymbalta [duloxetine Hcl] Swelling   Gabitril [tiagabine] Swelling   Keflex [cephalexin] Nausea And Vomiting   Lyrica [pregabalin] Swelling   Neurontin [gabapentin] Swelling   Nexium [esomeprazole] Rash   Nsaids Rash   Penicillins Rash   Injection site reaction. Tolerated cefepime in past Has patient had a PCN reaction causing immediate rash, facial/tongue/throat swelling, SOB or lightheadedness with hypotension: No Has patient had a PCN reaction causing severe rash involving mucus membranes or skin necrosis: No Has patient had a PCN reaction that required hospitalization No Has patient had a PCN reaction occurring within the last 10 years: No If all of the above answers are "NO", then may proceed with Cephalosporin use.   Shrimp [shellfish Allergy] Anaphylaxis   Per patient "shrimp only"   Sulfa Antibiotics Nausea And Vomiting   Azactam [aztreonam]    Hand swelling    Azelastine Hcl    Rash    Ciprofloxacin Other (See Comments)   dizziness   Claritin [loratadine]    Irritability Nervousness    Methotrexate Derivatives    Nasacort [triamcinolone]    Dizzy    Olopatadine Other (See Comments)   Pain and lethargy    Sulfamethizole Other (See Comments)   unknown   Zantac [ranitidine Hcl] Other (See Comments)   unknown   Claritin-d 12 Hour [loratadine-pseudoephedrine Er] Anxiety      Medication List       Accurate as of March 03, 2021 11:59 PM. If you have any questions, ask your nurse or doctor.        acetaminophen 500 MG tablet Commonly known as: TYLENOL Take 500 mg by mouth every 6 (six) hours.   atorvastatin 20 MG tablet Commonly known as: LIPITOR Take 1 tablet (20 mg total) by mouth daily.   Biotin 10 MG Tabs Take 10 mg by mouth daily.   CRANBERRY PO Take 1 capsule by mouth daily.   Cyanocobalamin 1000 MCG/ML Kit Inject 1 mL as directed every 30 (thirty) days.   cycloSPORINE  0.05 % ophthalmic emulsion Commonly known as: RESTASIS Place 1 drop into both eyes 2 (two) times daily.   diclofenac Sodium 1 % Gel Commonly known as: VOLTAREN APPLY 2 TO 4 GRAMS TOPICALLY TO AFFECTED JOINTS UP TO 4 TIMES DAILY   EPINEPHrine 0.3 mg/0.3 mL Soaj injection Commonly known as: EPI-PEN Inject 0.3 mg into the muscle as needed for anaphylaxis.   fexofenadine 180 MG tablet Commonly known as: ALLEGRA Take 180 mg by mouth daily.   fluticasone 50 MCG/ACT nasal spray Commonly known as: FLONASE SPRAY 2 SPRAYS INTO EACH NOSTRIL EVERY DAY   furosemide 80 MG tablet Commonly known as: LASIX Take 1 tablet (80 mg total) by mouth daily.   ketoconazole 2 % shampoo Commonly known as: NIZORAL Apply 1 application topically once a week.  Klor-Con M10 10 MEQ tablet Generic drug: potassium chloride TAKE 2 TABLETS (20 MEQ TOTAL) BY MOUTH 2 (TWO) TIMES DAILY.   lansoprazole 30 MG capsule Commonly known as: PREVACID TAKE 1 CAPSULE BY MOUTH EVERY DAY AT 12 NOON   MELATONIN PO Take 10 mg by mouth daily.   montelukast 10 MG tablet Commonly known as: SINGULAIR TAKE 1 TABLET BY MOUTH EVERYDAY AT BEDTIME   predniSONE 10 MG tablet Commonly known as: DELTASONE TAKE 1 TABLET (10 MG TOTAL) BY MOUTH DAILY WITH BREAKFAST.   SYRINGE 3CC/25GX1" 25G X 1" 3 ML Misc 1 application by Does not apply route every 30 (thirty) days.   traMADol 50 MG tablet Commonly known as: ULTRAM TAKE 1 TABLET BY MOUTH FOUR TIMES A DAY   Vitamin D3 125 MCG (5000 UT) Caps Take 5,000 Units by mouth daily.       Review of Systems:  Review of Systems  Constitutional: Negative for appetite change, fatigue and fever.  HENT: Negative for congestion and voice change.   Eyes: Negative for visual disturbance.  Respiratory: Negative for cough, shortness of breath and wheezing.   Cardiovascular: Negative for leg swelling.  Gastrointestinal: Negative for abdominal pain and constipation.  Genitourinary: Negative  for difficulty urinating, dysuria and urgency.  Musculoskeletal: Positive for arthralgias, back pain, gait problem and myalgias.  Skin: Negative for color change.  Neurological: Negative for speech difficulty, weakness and light-headedness.  Psychiatric/Behavioral: Negative for behavioral problems, confusion and sleep disturbance. The patient is not nervous/anxious.     Health Maintenance  Topic Date Due  . TETANUS/TDAP  Never done  . COVID-19 Vaccine (4 - Booster for Moderna series) 04/18/2021  . INFLUENZA VACCINE  Completed  . DEXA SCAN  Completed  . PNA vac Low Risk Adult  Completed  . HPV VACCINES  Aged Out    Physical Exam: Vitals:   03/03/21 1435  BP: 126/80  Pulse: 88  Temp: (!) 97 F (36.1 C)  SpO2: 96%  Weight: 178 lb 3.2 oz (80.8 kg)  Height: 5' 4.5" (1.638 m)   Body mass index is 30.12 kg/m. Physical Exam Vitals and nursing note reviewed.  Constitutional:      Appearance: Normal appearance.  HENT:     Head: Normocephalic and atraumatic.     Mouth/Throat:     Mouth: Mucous membranes are moist.  Eyes:     Extraocular Movements: Extraocular movements intact.     Conjunctiva/sclera: Conjunctivae normal.     Pupils: Pupils are equal, round, and reactive to light.  Neck:     Comments: C/o left upper back/neck pain, better with warm compress, will work with therapy Cardiovascular:     Rate and Rhythm: Normal rate and regular rhythm.     Heart sounds: No murmur heard.   Pulmonary:     Breath sounds: Normal breath sounds. No rales.  Abdominal:     General: Bowel sounds are normal. There is no distension.     Palpations: Abdomen is soft.     Tenderness: There is no abdominal tenderness. There is no right CVA tenderness, left CVA tenderness, guarding or rebound.  Musculoskeletal:     Cervical back: Normal range of motion and neck supple.     Right lower leg: No edema.     Left lower leg: No edema.  Skin:    General: Skin is warm and dry.     Comments: AL  left upper arm, R scapula  Neurological:     General: No focal deficit  present.     Mental Status: She is alert and oriented to person, place, and time. Mental status is at baseline.     Motor: No weakness.     Coordination: Coordination normal.     Gait: Gait abnormal.  Psychiatric:        Mood and Affect: Mood normal.        Behavior: Behavior normal.        Thought Content: Thought content normal.        Judgment: Judgment normal.     Labs reviewed: Basic Metabolic Panel: Recent Labs    04/06/20 0730 05/13/20 1123 07/13/20 0710 07/19/20 1438 07/27/20 1107 02/03/21 1422  NA 142   < > 141 143  --  141  K 4.0   < > 4.3 3.6  --  4.3  CL 103   < > 100 104  --  100  CO2 30   < > 27 30  --  33*  GLUCOSE 91   < > 87 76  --  103*  BUN 26*   < > 23 22  --  23  CREATININE 1.05*   < > 1.14* 1.01*  --  1.05*  CALCIUM 10.3   < > 10.1 10.2  --  9.8  TSH 3.16  --   --   --  2.04  --    < > = values in this interval not displayed.   Liver Function Tests: Recent Labs    06/23/20 1323 06/25/20 0941 07/13/20 0710 07/19/20 1438 02/03/21 1422  AST '18 20 19 15 14  ' ALT '15 14 13 11 11  ' ALKPHOS 49 43  --   --   --   BILITOT 1.2 1.1 0.5 0.5 0.6  PROT 7.3 6.3* 7.5 7.1 7.0  ALBUMIN 3.3* 2.7*  --   --   --    Recent Labs    06/23/20 1323 07/19/20 1438  LIPASE 21 23   No results for input(s): AMMONIA in the last 8760 hours. CBC: Recent Labs    07/13/20 0710 07/19/20 1438 02/03/21 1422  WBC 13.7* 10.5 15.0*  NEUTROABS 8,234* 5,523 12,645*  HGB 13.9 13.2 13.5  HCT 42.0 40.5 41.0  MCV 95.0 96.4 93.8  PLT 354 278 272   Lipid Panel: Recent Labs    04/06/20 0730  CHOL 176  HDL 67  LDLCALC 82  TRIG 171*  CHOLHDL 2.6   No results found for: HGBA1C  Procedures since last visit: No results found.  Assessment/Plan  Rheumatoid arthritis (HCC) RA/fibromyalgia,  takes Prednisone, Tramadol, TSH 2.04 07/27/20, wbc 15.0 02/03/21  GERD (gastroesophageal reflux  disease) Prevacid 79m qd, Hgb 13.5 02/03/21   Edema of both lower extremities due to peripheral venous insufficiency Venous insufficiency/Edema , takes Furosemide 84mqd, Kcl. Bun/creat 23/1.05 02/03/21, update CMP/eGFR   Age-related osteoporosis without current pathological fracture Reclast per rheumatology   B12 deficiency Takes Vit B12 inj, repeat Vit B12 level.   Adnexal cyst CT left adnexa cyst, f/[place].  03/03/21 pt declined f/[place] Asthma Epinephrine prn, Allegra, Flonase, Singulair   Perforation of sigmoid colon due to diverticulitis Hx of aute sigmoid diverticulitis with pneumoperitoneum, treated with  Omnicef/Falgyl7/2021,f/u Dr. KiKieth BrightlyAK (actinic keratosis) See dermatology    Labs/tests ordered:  CMP/eGFR, Vitamin B12  Next appt:  4 months.

## 2021-03-03 NOTE — Assessment & Plan Note (Signed)
Venous insufficiency/Edema , takes Furosemide 79m qd, Kcl. Bun/creat 23/1.05 02/03/21, update CMP/eGFR

## 2021-03-03 NOTE — Assessment & Plan Note (Addendum)
CT left adnexa cyst, f/u US 6 months.  03/03/21 pt declined f/u US

## 2021-03-03 NOTE — Assessment & Plan Note (Signed)
Prevacid 30mg  qd, Hgb 13.5 02/03/21

## 2021-03-03 NOTE — Assessment & Plan Note (Signed)
RA/fibromyalgia,  takes Prednisone, Tramadol, TSH 2.04 07/27/20, wbc 15.0 02/03/21

## 2021-03-03 NOTE — Assessment & Plan Note (Signed)
Hx of aute sigmoid diverticulitis with pneumoperitoneum, treated with  Omnicef/Falgyl7/2021,f/u Dr. [name]

## 2021-03-04 ENCOUNTER — Encounter: Payer: Self-pay | Admitting: Nurse Practitioner

## 2021-03-04 MED ORDER — CYANOCOBALAMIN 1000 MCG/ML IJ KIT
1.0000 mL | PACK | INTRAMUSCULAR | 3 refills | Status: DC
Start: 2021-03-04 — End: 2021-06-23

## 2021-03-04 MED ORDER — "SYRINGE 25G X 1"" 3 ML MISC": 1.0000 "application " | 0 refills | Status: DC

## 2021-03-11 ENCOUNTER — Telehealth: Payer: Self-pay

## 2021-03-11 ENCOUNTER — Other Ambulatory Visit: Payer: Self-pay

## 2021-03-11 ENCOUNTER — Ambulatory Visit (INDEPENDENT_AMBULATORY_CARE_PROVIDER_SITE_OTHER): Payer: Medicare PPO | Admitting: Family

## 2021-03-11 ENCOUNTER — Encounter: Payer: Self-pay | Admitting: Family

## 2021-03-11 VITALS — BP 128/82 | HR 89 | Temp 98.6°F | Resp 16 | Ht 64.5 in | Wt 178.2 lb

## 2021-03-11 DIAGNOSIS — R399 Unspecified symptoms and signs involving the genitourinary system: Secondary | ICD-10-CM

## 2021-03-11 LAB — POCT URINALYSIS DIPSTICK
Bilirubin, UA: NEGATIVE
Glucose, UA: NEGATIVE
Nitrite, UA: POSITIVE
Protein, UA: POSITIVE — AB
Spec Grav, UA: 1.02 (ref 1.010–1.025)
Urobilinogen, UA: NEGATIVE E.U./dL — AB
pH, UA: 5 (ref 5.0–8.0)

## 2021-03-11 MED ORDER — NITROFURANTOIN MONOHYD MACRO 100 MG PO CAPS: 100.0000 mg | ORAL_CAPSULE | Freq: Two times a day (BID) | ORAL | 0 refills | Status: DC

## 2021-03-11 MED ORDER — SACCHAROMYCES BOULARDII 250 MG PO CAPS
250.0000 mg | ORAL_CAPSULE | Freq: Two times a day (BID) | ORAL | 0 refills | Status: AC
Start: 2021-03-11 — End: 2021-03-21

## 2021-03-11 NOTE — Patient Instructions (Signed)
- Increase water intake to 6-8 glasses daily  - Urine specimen send for culture will call you with results in 3 days  - Notify provider if symptoms worsen or fail to improve   Urinary Tract Infection, Adult A urinary tract infection (UTI) is an infection of any part of the urinary tract. The urinary tract includes:  The kidneys.  The ureters.  The bladder.  The urethra. These organs make, store, and get rid of pee (urine) in the body. What are the causes? This infection is caused by germs (bacteria) in your genital area. These germs grow and cause swelling (inflammation) of your urinary tract. What increases the risk? The following factors may make you more likely to develop this condition:  Using a small, thin tube (catheter) to drain pee.  Not being able to control when you pee or poop (incontinence).  Being female. If you are female, these things can increase the risk: ? Using these methods to prevent pregnancy:  A medicine that kills sperm (spermicide).  A device that blocks sperm (diaphragm). ? Having low levels of a female hormone (estrogen). ? Being pregnant. You are more likely to develop this condition if:  You have genes that add to your risk.  You are sexually active.  You take antibiotic medicines.  You have trouble peeing because of: ? A prostate that is bigger than normal, if you are female. ? A blockage in the part of your body that drains pee from the bladder. ? A kidney stone. ? A nerve condition that affects your bladder. ? Not getting enough to drink. ? Not peeing often enough.  You have other conditions, such as: ? Diabetes. ? A weak disease-fighting system (immune system). ? Sickle cell disease. ? Gout. ? Injury of the spine. What are the signs or symptoms? Symptoms of this condition include:  Needing to pee right away.  Peeing small amounts often.  Pain or burning when peeing.  Blood in the pee.  Pee that smells bad or not like  normal.  Trouble peeing.  Pee that is cloudy.  Fluid coming from the vagina, if you are female.  Pain in the belly or lower back. Other symptoms include:  Vomiting.  Not feeling hungry.  Feeling mixed up (confused). This may be the first symptom in older adults.  Being tired and grouchy (irritable).  A fever.  Watery poop (diarrhea). How is this treated?  Taking antibiotic medicine.  Taking other medicines.  Drinking enough water. In some cases, you may need to see a specialist. Follow these instructions at home: Medicines  Take over-the-counter and prescription medicines only as told by your doctor.  If you were prescribed an antibiotic medicine, take it as told by your doctor. Do not stop taking it even if you start to feel better. General instructions  Make sure you: ? Pee until your bladder is empty. ? Do not hold pee for a long time. ? Empty your bladder after sex. ? Wipe from front to back after peeing or pooping if you are a female. Use each tissue one time when you wipe.  Drink enough fluid to keep your pee pale yellow.  Keep all follow-up visits.   Contact a doctor if:  You do not get better after 1-2 days.  Your symptoms go away and then come back. Get help right away if:  You have very bad back pain.  You have very bad pain in your lower belly.  You have a fever.  You have chills.  You feeling like you will vomit or you vomit. Summary  A urinary tract infection (UTI) is an infection of any part of the urinary tract.  This condition is caused by germs in your genital area.  There are many risk factors for a UTI.  Treatment includes antibiotic medicines.  Drink enough fluid to keep your pee pale yellow. This information is not intended to replace advice given to you by your health care provider. Make sure you discuss any questions you have with your health care provider. Document Revised: 07/09/2020 Document Reviewed:  07/09/2020 Elsevier Patient Education  [place].

## 2021-03-11 NOTE — Telephone Encounter (Signed)
Patient called stating she took an at home UTI test and it was positive. Patient also states she had a slight fever yesterday but today no fever. Patient not sure what to do.  Patient scheduled to see Marlowe Sax, NP today

## 2021-03-11 NOTE — Progress Notes (Signed)
Provider: Biagio Snelson FNP-C  [name], MD  Patient Care Team: [name], MD as PCP - General (Internal Medicine)  Extended Emergency Contact Information Primary Emergency Contact: Debrew,Jacqueline Dr.  Johnnette Litter of [place] Phone: [phone] Mobile Phone: [phone] Relation: Daughter  Code Status:  Full Code  Goals of care: Advanced Directive information Advanced Directives 03/11/2021  Does Patient Have a Medical Advance Directive? Yes  Type of Paramedic of [place];Living will  Does patient want to make changes to medical advance directive? No - Patient declined  Copy of [place] in Chart? Yes - validated most recent copy scanned in chart (See row information)  Would patient like information on creating a medical advance directive? -     Chief Complaint  Patient presents with  . Acute Visit    Possible UTI     HPI:  Pt is a 87 y.o. female seen today for an acute visit for evaluation of possible urinary tract infection.Has had dysuria and lower back pain x 1 day.denies any urine urgency or frequency No fever,chills,abdominal pain or N/V.States wanted to get check since weekend is here does not want symptoms to worsen.has been hospitalized in the past due to uTI    Past Medical History:  Diagnosis Date  . Adrenal failure ([place])   . Arthritis   . Asthma   . Cancer ([place])   . Cataract   . Closed nondisplaced fracture of fifth right metatarsal bone 09/18/2017  . Diverticulitis    Per patient  . Fibromyalgia 2008  . HCAP (healthcare-associated pneumonia) 02/03/2018  . Osteoporosis   . RA (rheumatoid arthritis) ([place])   . Recurrent upper respiratory infection (URI)   . Sepsis due to urinary tract infection ([place]) 01/18/2018  . Urticaria    Past Surgical History:  Procedure Laterality Date  . BACK SURGERY    . BILATERAL CARPAL TUNNEL RELEASE  2005   right and left  . CATARACT EXTRACTION, BILATERAL   2004   right and left  . CERVICAL FUSION  2011,2010,2008   2 disks  . HEEL SPUR SURGERY  2004  . lower back fusion  2011   Fusion of 3-4 and 4-5 lower back  . [place]  . ROTATOR CUFF REPAIR  [id number]  . SQUAMOUS CELL CARCINOMA EXCISION    . TONSILLECTOMY AND ADENOIDECTOMY  1947  . TOTAL SHOULDER ARTHROPLASTY      Allergies  Allergen Reactions  . Adhesive [Tape] Rash  . Celebrex [Celecoxib] Hives  . Ciprofibrate Nausea Only  . Cymbalta [Duloxetine Hcl] Swelling  . Gabitril [Tiagabine] Swelling  . Keflex [Cephalexin] Nausea And Vomiting  . Lyrica [Pregabalin] Swelling  . Neurontin [Gabapentin] Swelling  . Nexium [Esomeprazole] Rash  . Nsaids Rash  . Penicillins Rash    Injection site reaction. Tolerated cefepime in past Has patient had a PCN reaction causing immediate rash, facial/tongue/throat swelling, SOB or lightheadedness with hypotension: No Has patient had a PCN reaction causing severe rash involving mucus membranes or skin necrosis: No Has patient had a PCN reaction that required hospitalization No Has patient had a PCN reaction occurring within the last 10 years: No If all of the above answers are "NO", then may proceed with Cephalosporin use.   . Shrimp [Shellfish Allergy] Anaphylaxis    Per patient "shrimp only"  . Sulfa Antibiotics Nausea And Vomiting  . Azactam [Aztreonam]     Hand swelling   . Azelastine Hcl  Rash   . Ciprofloxacin Other (See Comments)    dizziness  . Claritin [Loratadine]     Irritability Nervousness   . Methotrexate Derivatives   . Nasacort [Triamcinolone]     Dizzy   . Olopatadine Other (See Comments)    Pain and lethargy   . Sulfamethizole Other (See Comments)    unknown  . Zantac [Ranitidine Hcl] Other (See Comments)    unknown  . Claritin-D 12 Hour [Loratadine-Pseudoephedrine Er] Anxiety    Outpatient Encounter Medications as of 03/11/2021  Medication Sig  . acetaminophen (TYLENOL) 500 MG tablet  Take 500 mg by mouth every 6 (six) hours.   Marland Kitchen atorvastatin (LIPITOR) 20 MG tablet Take 1 tablet (20 mg total) by mouth daily.  . Biotin 10 MG TABS Take 10 mg by mouth daily.  . Cholecalciferol (VITAMIN D3) 5000 units CAPS Take 5,000 Units by mouth daily.  Marland Kitchen CRANBERRY PO Take 1 capsule by mouth daily.  . Cyanocobalamin 1000 MCG/ML KIT Inject 1 mL as directed every 30 (thirty) days.  . cycloSPORINE (RESTASIS) 0.05 % ophthalmic emulsion Place 1 drop into both eyes 2 (two) times daily.   . diclofenac Sodium (VOLTAREN) 1 % GEL APPLY 2 TO 4 GRAMS TOPICALLY TO AFFECTED JOINTS UP TO 4 TIMES DAILY  . EPINEPHrine 0.3 mg/0.3 mL IJ SOAJ injection Inject 0.3 mg into the muscle as needed for anaphylaxis.   . fexofenadine (ALLEGRA) 180 MG tablet Take 180 mg by mouth daily.  . fluticasone (FLONASE) 50 MCG/ACT nasal spray SPRAY 2 SPRAYS INTO EACH NOSTRIL EVERY DAY  . furosemide (LASIX) 80 MG tablet Take 1 tablet (80 mg total) by mouth daily.  Marland Kitchen ketoconazole (NIZORAL) 2 % shampoo Apply 1 application topically once a week.   Marland Kitchen KLOR-CON M10 10 MEQ tablet TAKE 2 TABLETS (20 MEQ TOTAL) BY MOUTH 2 (TWO) TIMES DAILY.  Marland Kitchen lansoprazole (PREVACID) 30 MG capsule TAKE 1 CAPSULE BY MOUTH EVERY DAY AT 12 NOON  . MELATONIN PO Take 10 mg by mouth daily.   . montelukast (SINGULAIR) 10 MG tablet TAKE 1 TABLET BY MOUTH EVERYDAY AT BEDTIME  . nitrofurantoin, macrocrystal-monohydrate, (MACROBID) 100 MG capsule Take 1 capsule (100 mg total) by mouth 2 (two) times daily for 7 days.  . predniSONE (DELTASONE) 10 MG tablet TAKE 1 TABLET (10 MG TOTAL) BY MOUTH DAILY WITH BREAKFAST.  Marland Kitchen saccharomyces boulardii (FLORASTOR) 250 MG capsule Take 1 capsule (250 mg total) by mouth 2 (two) times daily for 10 days.  . Syringe/Needle, Disp, (SYRINGE 3CC/25GX1") 25G X 1" 3 ML MISC 1 application by Does not apply route every 30 (thirty) days.  . traMADol (ULTRAM) 50 MG tablet TAKE 1 TABLET BY MOUTH FOUR TIMES A DAY   No facility-administered  encounter medications on file as of 03/11/2021.    Review of Systems  Constitutional: Negative for appetite change, chills, fatigue and fever.  Respiratory: Negative for cough, chest tightness, shortness of breath and wheezing.   Cardiovascular: Negative for chest pain, palpitations and leg swelling.  Gastrointestinal: Negative for abdominal distention, abdominal pain, diarrhea, nausea and vomiting.  Genitourinary: Positive for dysuria. Negative for difficulty urinating, frequency and urgency.  Musculoskeletal: Positive for back pain and gait problem.  Skin: Negative for color change, pallor and rash.    Immunization History  Administered Date(s) Administered  . Fluad Quad(high Dose 65+) 09/30/2020  . Influenza, High Dose Seasonal PF 08/16/2018, 09/08/2019, 09/24/2020  . Influenza,inj,Quad PF,6+ Mos 08/11/2016  . Influenza-Unspecified 11/12/2017  . Moderna Sars-Covid-2 Vaccination 12/15/2019, 01/12/2020,  10/19/2020  . PPD Test 03/01/2018  . Pneumococcal Conjugate-13 06/26/2017  . Pneumococcal Polysaccharide-23 12/11/2009  . Zoster Recombinat (Shingrix) 01/28/2019, 09/02/2019   Pertinent  Health Maintenance Due  Topic Date Due  . INFLUENZA VACCINE  07/11/2021  . DEXA SCAN  Completed  . PNA vac Low Risk Adult  Completed   Fall Risk  03/11/2021 03/03/2021 01/11/2021 10/15/2020 08/10/2020  Falls in the past year? 0 0 0 0 1  Number falls in past yr: 0 0 0 0 0  Comment - - - - -  Injury with Fall? 0 - 0 - 0  Comment - - - - -  Risk Factor Category  - - - - -  Risk for fall due to : - - - - -  Risk for fall due to: Comment - - - - -  Follow up - - - - -   Functional Status Survey:    Vitals:   03/11/21 1451  BP: 128/82  Pulse: 89  Resp: 16  Temp: 98.6 F (37 C)  SpO2: 94%  Weight: 178 lb 3.2 oz (80.8 kg)  Height: 5' 4.5" (1.638 m)   Body mass index is 30.12 kg/m. Physical Exam Vitals reviewed.  Constitutional:      General: She is not in acute distress.    Appearance:  She is obese. She is not ill-appearing.  Eyes:     General: No scleral icterus.       Right eye: No discharge.        Left eye: No discharge.     Extraocular Movements: Extraocular movements intact.     Conjunctiva/sclera: Conjunctivae normal.     Pupils: Pupils are equal, round, and reactive to light.  Cardiovascular:     Rate and Rhythm: Normal rate and regular rhythm.     Pulses: Normal pulses.     Heart sounds: Normal heart sounds. No murmur heard. No friction rub. No gallop.   Pulmonary:     Effort: Pulmonary effort is normal. No respiratory distress.     Breath sounds: Normal breath sounds. No wheezing, rhonchi or rales.  Chest:     Chest wall: No tenderness.  Abdominal:     General: There is no distension.     Palpations: Abdomen is soft. There is no mass.     Tenderness: There is no abdominal tenderness. There is no right CVA tenderness, left CVA tenderness, guarding or rebound.  Skin:    General: Skin is warm and dry.     Coloration: Skin is not pale.     Findings: No bruising, erythema or rash.  Neurological:     Mental Status: She is alert and oriented to person, place, and time.     Motor: No weakness.     Gait: Gait normal.  Psychiatric:        Mood and Affect: Mood normal.        Behavior: Behavior normal.        Thought Content: Thought content normal.        Judgment: Judgment normal.     Labs reviewed: Recent Labs    07/13/20 0710 07/19/20 1438 02/03/21 1422  NA 141 143 141  K 4.3 3.6 4.3  CL 100 104 100  CO2 27 30 33*  GLUCOSE 87 76 103*  BUN '23 22 23  ' CREATININE 1.14* 1.01* 1.05*  CALCIUM 10.1 10.2 9.8   Recent Labs    06/23/20 1323 06/25/20 0941 07/13/20 0710 07/19/20 1438 02/03/21 1422  AST '18 20 19 15 14  ' ALT '15 14 13 11 11  ' ALKPHOS 49 43  --   --   --   BILITOT 1.2 1.1 0.5 0.5 0.6  PROT 7.3 6.3* 7.5 7.1 7.0  ALBUMIN 3.3* 2.7*  --   --   --    Recent Labs    07/13/20 0710 07/19/20 1438 02/03/21 1422  WBC 13.7* 10.5 15.0*   NEUTROABS 8,234* 5,523 12,645*  HGB 13.9 13.2 13.5  HCT 42.0 40.5 41.0  MCV 95.0 96.4 93.8  PLT 354 278 272   Lab Results  Component Value Date   TSH 2.04 07/27/2020   No results found for: HGBA1C Lab Results  Component Value Date   CHOL 176 04/06/2020   HDL 67 04/06/2020   LDLCALC 82 04/06/2020   TRIG 171 (H) 04/06/2020   CHOLHDL 2.6 04/06/2020    Significant Diagnostic Results in last 30 days:  No results found.  Assessment/Plan  Symptoms of urinary tract infection Afebrile. - POC Urinalysis Dipstick indicates yellow clear urine with trace ketones,large blood,positive for proteins,Nitrites and large Leukocytes.Has several allergies to antibiotics.  - start on Nitrofurantoin as below.side effects discussed.Advised to take along with probiotics.Made Aware culture results will return in 3 days.  - Urine Culture - nitrofurantoin, macrocrystal-monohydrate, (MACROBID) 100 MG capsule; Take 1 capsule (100 mg total) by mouth 2 (two) times daily for 7 days.  Dispense: 14 capsule; Refill: 0 - saccharomyces boulardii (FLORASTOR) 250 MG capsule; Take 1 capsule (250 mg total) by mouth 2 (two) times daily for 10 days.  Dispense: 20 capsule; Refill: 0  Family/ staff Communication: Reviewed plan of care with patient verbalized understanding.  Labs/tests ordered:  - Urine Culture  Next Appointment: As needed if symptoms worsen or fail to improve    Sandrea Hughs, NP

## 2021-03-13 LAB — URINE CULTURE
MICRO NUMBER:: 11722647
SPECIMEN QUALITY:: ADEQUATE

## 2021-03-15 ENCOUNTER — Other Ambulatory Visit: Payer: Self-pay

## 2021-03-15 MED ORDER — CEFPODOXIME PROXETIL 100 MG PO TABS: 100.0000 mg | ORAL_TABLET | Freq: Two times a day (BID) | ORAL | 0 refills | Status: DC

## 2021-03-18 ENCOUNTER — Other Ambulatory Visit: Payer: Self-pay | Admitting: Nurse Practitioner

## 2021-03-18 DIAGNOSIS — R600 Localized edema: Secondary | ICD-10-CM

## 2021-03-24 DIAGNOSIS — H35372 Puckering of macula, left eye: Secondary | ICD-10-CM | POA: Diagnosis not present

## 2021-03-24 DIAGNOSIS — H52203 Unspecified astigmatism, bilateral: Secondary | ICD-10-CM | POA: Diagnosis not present

## 2021-03-24 DIAGNOSIS — Z961 Presence of intraocular lens: Secondary | ICD-10-CM | POA: Diagnosis not present

## 2021-03-24 DIAGNOSIS — H04123 Dry eye syndrome of bilateral lacrimal glands: Secondary | ICD-10-CM | POA: Diagnosis not present

## 2021-03-28 ENCOUNTER — Other Ambulatory Visit: Payer: Self-pay

## 2021-03-28 ENCOUNTER — Encounter: Payer: Self-pay | Admitting: Family

## 2021-03-28 ENCOUNTER — Ambulatory Visit: Payer: Medicare PPO | Admitting: Family

## 2021-03-28 VITALS — BP 110/64 | HR 107 | Temp 97.5°F | Resp 16 | Ht 64.5 in | Wt 176.2 lb

## 2021-03-28 DIAGNOSIS — R059 Cough, unspecified: Secondary | ICD-10-CM | POA: Diagnosis not present

## 2021-03-28 DIAGNOSIS — R399 Unspecified symptoms and signs involving the genitourinary system: Secondary | ICD-10-CM

## 2021-03-28 LAB — POCT URINALYSIS DIPSTICK
Bilirubin, UA: NEGATIVE
Glucose, UA: NEGATIVE
Ketones, UA: NEGATIVE
Nitrite, UA: NEGATIVE
Protein, UA: NEGATIVE
Spec Grav, UA: 1.015 (ref 1.010–1.025)
Urobilinogen, UA: NEGATIVE E.U./dL — AB
pH, UA: 6.5 (ref 5.0–8.0)

## 2021-03-28 MED ORDER — DOXYCYCLINE HYCLATE 100 MG PO TABS: 100.0000 mg | ORAL_TABLET | Freq: Two times a day (BID) | ORAL | 0 refills | Status: DC

## 2021-03-28 NOTE — Progress Notes (Signed)
Provider: Amit Leece FNP-C  [name], MD  Patient Care Team: [name], MD as PCP - General (Internal Medicine)  Extended Emergency Contact Information Primary Emergency Contact: Debrew,Jacqueline Dr.  Johnnette Litter of [place] Phone: [phone] Mobile Phone: [phone] Relation: Daughter  Code Status:  Full Code  Goals of care: Advanced Directive information Advanced Directives 03/28/2021  Does Patient Have a Medical Advance Directive? Yes  Type of Paramedic of [place];Living will  Does patient want to make changes to medical advance directive? No - Patient declined  Copy of [place] in Chart? Yes - validated most recent copy scanned in chart (See row information)  Would patient like information on creating a medical advance directive? -     Chief Complaint  Patient presents with  . Acute Visit    Complains for slight wheezing/ pain in chest while moving and possible UTI    HPI:  Pt is a 87 y.o. 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    Also did a urine test using home test kit today showed she had a urinary tract infection.Has had lower back pain and urinary frequency. Denies any urine urgency or dysuria.    Past Medical History:  Diagnosis Date  . Adrenal failure ([place])   . Arthritis   . Asthma   . Cancer ([place])   . Cataract   . Closed nondisplaced fracture of fifth right metatarsal bone 09/18/2017  . Diverticulitis    Per patient  . Fibromyalgia 2008  . HCAP (healthcare-associated pneumonia) 02/03/2018  . Osteoporosis   . RA (rheumatoid arthritis) ([place])   . Recurrent upper respiratory infection (URI)   . Sepsis due to urinary tract  infection ([place]) 01/18/2018  . Urticaria    Past Surgical History:  Procedure Laterality Date  . BACK SURGERY    . BILATERAL CARPAL TUNNEL RELEASE  2005   right and left  . CATARACT EXTRACTION, BILATERAL  2004   right and left  . CERVICAL FUSION  2011,2010,2008   2 disks  . HEEL SPUR SURGERY  2004  . lower back fusion  2011   Fusion of 3-4 and 4-5 lower back  . [place]  . ROTATOR CUFF REPAIR  [id number]  . SQUAMOUS CELL CARCINOMA EXCISION    . TONSILLECTOMY AND ADENOIDECTOMY  1947  . TOTAL SHOULDER ARTHROPLASTY      Allergies  Allergen Reactions  . Adhesive [Tape] Rash  . Celebrex [Celecoxib] Hives  . Ciprofibrate Nausea Only  . Cymbalta [Duloxetine Hcl] Swelling  . Gabitril [Tiagabine] Swelling  . Keflex [Cephalexin] Nausea And Vomiting  . Lyrica [Pregabalin] Swelling  . Neurontin [Gabapentin] Swelling  . Nexium [Esomeprazole] Rash  . Nsaids Rash  . Penicillins Rash    Injection site reaction. Tolerated cefepime in past Has patient had a PCN reaction causing immediate rash, facial/tongue/throat swelling, SOB or lightheadedness with hypotension: No Has patient had a PCN reaction causing severe rash involving mucus membranes or skin necrosis: No Has patient had a PCN reaction that required hospitalization No Has patient had a PCN reaction occurring within the last 10 years: No If all of the above answers are "NO", then may proceed with Cephalosporin use.   . Shrimp [Shellfish  Allergy] Anaphylaxis    Per patient "shrimp only"  . Sulfa Antibiotics Nausea And Vomiting  . Azactam [Aztreonam]     Hand swelling   . Azelastine Hcl     Rash   . Ciprofloxacin Other (See Comments)    dizziness  . Claritin [Loratadine]     Irritability Nervousness   . Methotrexate Derivatives   . Nasacort [Triamcinolone]     Dizzy   . Olopatadine Other (See Comments)    Pain and lethargy   . Other   . Sulfamethizole Other (See Comments)    unknown  . Zantac  [Ranitidine Hcl] Other (See Comments)    unknown  . Claritin-D 12 Hour [Loratadine-Pseudoephedrine Er] Anxiety    Outpatient Encounter Medications as of 03/28/2021  Medication Sig  . acetaminophen (TYLENOL) 500 MG tablet Take 500 mg by mouth every 6 (six) hours.   Marland Kitchen atorvastatin (LIPITOR) 20 MG tablet Take 1 tablet (20 mg total) by mouth daily.  . Biotin 10 MG TABS Take 10 mg by mouth daily.  . cefpodoxime (VANTIN) 100 MG tablet Take 1 tablet (100 mg total) by mouth 2 (two) times daily. For 7 days  . Cholecalciferol (VITAMIN D3) 5000 units CAPS Take 5,000 Units by mouth daily.  Marland Kitchen CRANBERRY PO Take 1 capsule by mouth daily.  . Cyanocobalamin 1000 MCG/ML KIT Inject 1 mL as directed every 30 (thirty) days.  . cycloSPORINE (RESTASIS) 0.05 % ophthalmic emulsion Place 1 drop into both eyes 2 (two) times daily.   . diclofenac Sodium (VOLTAREN) 1 % GEL APPLY 2 TO 4 GRAMS TOPICALLY TO AFFECTED JOINTS UP TO 4 TIMES DAILY  . EPINEPHrine 0.3 mg/0.3 mL IJ SOAJ injection Inject 0.3 mg into the muscle as needed for anaphylaxis.   . fexofenadine (ALLEGRA) 180 MG tablet Take 180 mg by mouth daily.  . fluticasone (FLONASE) 50 MCG/ACT nasal spray SPRAY 2 SPRAYS INTO EACH NOSTRIL EVERY DAY  . furosemide (LASIX) 80 MG tablet Take 1 tablet (80 mg total) by mouth daily.  Marland Kitchen ketoconazole (NIZORAL) 2 % shampoo Apply 1 application topically once a week.   Marland Kitchen KLOR-CON M10 10 MEQ tablet TAKE 2 TABLETS (20 MEQ TOTAL) BY MOUTH 2 (TWO) TIMES DAILY.  Marland Kitchen lansoprazole (PREVACID) 30 MG capsule TAKE 1 CAPSULE BY MOUTH EVERY DAY AT 12 NOON  . MELATONIN PO Take 10 mg by mouth daily.   . montelukast (SINGULAIR) 10 MG tablet TAKE 1 TABLET BY MOUTH EVERYDAY AT BEDTIME  . predniSONE (DELTASONE) 10 MG tablet TAKE 1 TABLET (10 MG TOTAL) BY MOUTH DAILY WITH BREAKFAST.  Marland Kitchen Syringe/Needle, Disp, (SYRINGE 3CC/25GX1") 25G X 1" 3 ML MISC 1 application by Does not apply route every 30 (thirty) days.  . traMADol (ULTRAM) 50 MG tablet TAKE 1  TABLET BY MOUTH FOUR TIMES A DAY   No facility-administered encounter medications on file as of 03/28/2021.    Review of Systems  Constitutional: Negative for appetite change, chills, fatigue and fever.  Respiratory: Positive for cough. Negative for chest tightness, shortness of breath and wheezing.   Cardiovascular: Negative for chest pain, palpitations and leg swelling.    Immunization History  Administered Date(s) Administered  . Fluad Quad(high Dose 65+) 09/30/2020  . Influenza, High Dose Seasonal PF 08/16/2018, 09/08/2019, 09/24/2020  . Influenza,inj,Quad PF,6+ Mos 08/11/2016  . Influenza-Unspecified 11/12/2017  . Moderna Sars-Covid-2 Vaccination 12/15/2019, 01/12/2020, 10/19/2020  . PPD Test 03/01/2018  . Pneumococcal Conjugate-13 06/26/2017  . Pneumococcal Polysaccharide-23 12/11/2009  . Zoster Recombinat (Shingrix) 01/28/2019, 09/02/2019  Pertinent  Health Maintenance Due  Topic Date Due  . INFLUENZA VACCINE  07/11/2021  . DEXA SCAN  Completed  . PNA vac Low Risk Adult  Completed   Fall Risk  03/28/2021 03/11/2021 03/03/2021 01/11/2021 10/15/2020  Falls in the past year? 0 0 0 0 0  Number falls in past yr: 0 0 0 0 0  Comment - - - - -  Injury with Fall? 0 0 - 0 -  Comment - - - - -  Risk Factor Category  - - - - -  Risk for fall due to : - - - - -  Risk for fall due to: Comment - - - - -  Follow up - - - - -   Functional Status Survey:    Vitals:   03/28/21 1539  BP: 110/64  Pulse: (!) 107  Resp: 16  Temp: (!) 97.5 F (36.4 C)  SpO2: 95%  Weight: 176 lb 3.2 oz (79.9 kg)  Height: 5' 4.5" (1.638 m)   Body mass index is 29.78 kg/m. Physical Exam Vitals reviewed.  Constitutional:      General: She is not in acute distress.    Appearance: Normal appearance. She is overweight. She is not ill-appearing or diaphoretic.  HENT:     Head: Normocephalic.     Right Ear: Tympanic membrane, ear canal and external ear normal. There is no impacted cerumen.     Left  Ear: Tympanic membrane, ear canal and external ear normal. There is no impacted cerumen.     Nose: Nose normal. No congestion or rhinorrhea.     Mouth/Throat:     Mouth: Mucous membranes are moist.     Pharynx: Oropharynx is clear. No oropharyngeal exudate or posterior oropharyngeal erythema.  Eyes:     General: No scleral icterus.       Right eye: No discharge.        Left eye: No discharge.     Extraocular Movements: Extraocular movements intact.     Conjunctiva/sclera: Conjunctivae normal.     Pupils: Pupils are equal, round, and reactive to light.  Neck:     Vascular: No carotid bruit.  Cardiovascular:     Rate and Rhythm: Normal rate and regular rhythm.     Pulses: Normal pulses.     Heart sounds: Normal heart sounds. No murmur heard. No friction rub. No gallop.   Pulmonary:     Effort: Pulmonary effort is normal. No respiratory distress.     Breath sounds: Examination of the left-middle field reveals rhonchi. Rhonchi and rales present. No wheezing.  Chest:     Chest wall: No tenderness.  Abdominal:     General: Bowel sounds are normal. There is no distension.     Palpations: Abdomen is soft. There is no mass.     Tenderness: There is no abdominal tenderness. There is no right CVA tenderness, left CVA tenderness, guarding or rebound.  Musculoskeletal:        General: No swelling or tenderness.     Cervical back: Normal range of motion. No rigidity or tenderness.     Right lower leg: No edema.     Left lower leg: No edema.     Comments: Unsteady gait   Lymphadenopathy:     Cervical: No cervical adenopathy.  Skin:    General: Skin is warm and dry.     Coloration: Skin is not pale.     Findings: No bruising, erythema, lesion or rash.  Neurological:  Mental Status: She is alert and oriented to person, place, and time.     Cranial Nerves: No cranial nerve deficit.     Sensory: No sensory deficit.     Motor: No weakness.     Coordination: Coordination normal.     Gait:  Gait normal.  Psychiatric:        Mood and Affect: Mood normal.        Speech: Speech normal.        Behavior: Behavior normal.        Thought Content: Thought content normal.        Judgment: Judgment normal.    Labs reviewed: Recent Labs    07/13/20 0710 07/19/20 1438 02/03/21 1422  NA 141 143 141  K 4.3 3.6 4.3  CL 100 104 100  CO2 27 30 33*  GLUCOSE 87 76 103*  BUN '23 22 23  ' CREATININE 1.14* 1.01* 1.05*  CALCIUM 10.1 10.2 9.8   Recent Labs    06/23/20 1323 06/25/20 0941 07/13/20 0710 07/19/20 1438 02/03/21 1422  AST '18 20 19 15 14  ' ALT '15 14 13 11 11  ' ALKPHOS 49 43  --   --   --   BILITOT 1.2 1.1 0.5 0.5 0.6  PROT 7.3 6.3* 7.5 7.1 7.0  ALBUMIN 3.3* 2.7*  --   --   --    Recent Labs    07/13/20 0710 07/19/20 1438 02/03/21 1422  WBC 13.7* 10.5 15.0*  NEUTROABS 8,234* 5,523 12,645*  HGB 13.9 13.2 13.5  HCT 42.0 40.5 41.0  MCV 95.0 96.4 93.8  PLT 354 278 272   Lab Results  Component Value Date   TSH 2.04 07/27/2020   No results found for: HGBA1C Lab Results  Component Value Date   CHOL 176 04/06/2020   HDL 67 04/06/2020   LDLCALC 82 04/06/2020   TRIG 171 (H) 04/06/2020   CHOLHDL 2.6 04/06/2020    Significant Diagnostic Results in last 30 days:  No results found.  Assessment/Plan  1. Symptoms of urinary tract infection Afebrile negative exam findings  - POC Urinalysis Dipstick indicates yellow clear with moderate blood and Leukocytes but negative for Nitrites.  Made aware will send urine for culture then will call with results in 3 days. - advised to notify provider if symptoms worsen ,running any fever or chills - Increased water intake to 6-8 glasses daily  - Urine Culture; Future  2. Cough Afebrile.Right lung lobe rales noted on auscultation. - encouraged deep breathing exercises  Will start on Doxycycline as below.  - advised to notify provider or go to ED if symptoms worsen or fail to improve  - doxycycline (VIBRA-TABS) 100 MG  tablet; Take 1 tablet (100 mg total) by mouth 2 (two) times daily.  Dispense: 20 tablet; Refill: 0  Family/ staff Communication: Reviewed plan of care with patient verbalized understanding.   Labs/tests ordered: - Urine Culture; Future - POC Urinalysis Dipstick   Next Appointment: As needed if symptoms worsen or fail to improve    Sandrea Hughs, NP

## 2021-03-28 NOTE — Patient Instructions (Signed)
Notify [prpvoder if symptoms worsen or not resolved. - Urine culture send to lab will call you with results

## 2021-03-29 DIAGNOSIS — R399 Unspecified symptoms and signs involving the genitourinary system: Secondary | ICD-10-CM | POA: Diagnosis not present

## 2021-03-30 LAB — URINE CULTURE
MICRO NUMBER:: 11788219
SPECIMEN QUALITY:: ADEQUATE

## 2021-04-11 DIAGNOSIS — M79604 Pain in right leg: Secondary | ICD-10-CM | POA: Diagnosis not present

## 2021-04-11 DIAGNOSIS — M069 Rheumatoid arthritis, unspecified: Secondary | ICD-10-CM | POA: Diagnosis not present

## 2021-04-11 DIAGNOSIS — M79605 Pain in left leg: Secondary | ICD-10-CM | POA: Diagnosis not present

## 2021-04-11 DIAGNOSIS — M6281 Muscle weakness (generalized): Secondary | ICD-10-CM | POA: Diagnosis not present

## 2021-04-11 DIAGNOSIS — M5459 Other low back pain: Secondary | ICD-10-CM | POA: Diagnosis not present

## 2021-04-14 DIAGNOSIS — R600 Localized edema: Secondary | ICD-10-CM

## 2021-04-14 DIAGNOSIS — M79604 Pain in right leg: Secondary | ICD-10-CM | POA: Diagnosis not present

## 2021-04-14 DIAGNOSIS — M5459 Other low back pain: Secondary | ICD-10-CM | POA: Diagnosis not present

## 2021-04-14 DIAGNOSIS — M6281 Muscle weakness (generalized): Secondary | ICD-10-CM | POA: Diagnosis not present

## 2021-04-14 DIAGNOSIS — M069 Rheumatoid arthritis, unspecified: Secondary | ICD-10-CM | POA: Diagnosis not present

## 2021-04-14 DIAGNOSIS — M79605 Pain in left leg: Secondary | ICD-10-CM | POA: Diagnosis not present

## 2021-04-14 MED ORDER — FUROSEMIDE 80 MG PO TABS: 80.0000 mg | ORAL_TABLET | Freq: Every day | ORAL | 1 refills | Status: DC

## 2021-04-14 MED ORDER — POTASSIUM CHLORIDE CRYS ER 10 MEQ PO TBCR: EXTENDED_RELEASE_TABLET | ORAL | 1 refills | Status: DC

## 2021-04-14 NOTE — Telephone Encounter (Signed)
Patient requested refills.  Pended Rx's and sent to [facility] for approval due to [place].

## 2021-04-19 DIAGNOSIS — M79605 Pain in left leg: Secondary | ICD-10-CM | POA: Diagnosis not present

## 2021-04-19 DIAGNOSIS — M79604 Pain in right leg: Secondary | ICD-10-CM | POA: Diagnosis not present

## 2021-04-19 DIAGNOSIS — M5459 Other low back pain: Secondary | ICD-10-CM | POA: Diagnosis not present

## 2021-04-19 DIAGNOSIS — M6281 Muscle weakness (generalized): Secondary | ICD-10-CM | POA: Diagnosis not present

## 2021-04-19 DIAGNOSIS — M069 Rheumatoid arthritis, unspecified: Secondary | ICD-10-CM | POA: Diagnosis not present

## 2021-04-21 DIAGNOSIS — M79605 Pain in left leg: Secondary | ICD-10-CM | POA: Diagnosis not present

## 2021-04-21 DIAGNOSIS — M5440 Lumbago with sciatica, unspecified side: Secondary | ICD-10-CM | POA: Diagnosis not present

## 2021-04-21 DIAGNOSIS — M79604 Pain in right leg: Secondary | ICD-10-CM | POA: Diagnosis not present

## 2021-04-21 DIAGNOSIS — M7061 Trochanteric bursitis, right hip: Secondary | ICD-10-CM | POA: Diagnosis not present

## 2021-04-21 DIAGNOSIS — M5459 Other low back pain: Secondary | ICD-10-CM | POA: Diagnosis not present

## 2021-04-21 DIAGNOSIS — M6281 Muscle weakness (generalized): Secondary | ICD-10-CM | POA: Diagnosis not present

## 2021-04-21 DIAGNOSIS — M069 Rheumatoid arthritis, unspecified: Secondary | ICD-10-CM | POA: Diagnosis not present

## 2021-04-24 ENCOUNTER — Other Ambulatory Visit: Payer: Self-pay | Admitting: Internal Medicine

## 2021-04-25 ENCOUNTER — Other Ambulatory Visit: Payer: Self-pay | Admitting: Neurosurgery

## 2021-04-25 DIAGNOSIS — M79605 Pain in left leg: Secondary | ICD-10-CM | POA: Diagnosis not present

## 2021-04-25 DIAGNOSIS — M7061 Trochanteric bursitis, right hip: Secondary | ICD-10-CM

## 2021-04-25 DIAGNOSIS — M5459 Other low back pain: Secondary | ICD-10-CM | POA: Diagnosis not present

## 2021-04-25 DIAGNOSIS — M79604 Pain in right leg: Secondary | ICD-10-CM | POA: Diagnosis not present

## 2021-04-25 DIAGNOSIS — M6281 Muscle weakness (generalized): Secondary | ICD-10-CM | POA: Diagnosis not present

## 2021-04-25 DIAGNOSIS — M069 Rheumatoid arthritis, unspecified: Secondary | ICD-10-CM | POA: Diagnosis not present

## 2021-04-28 ENCOUNTER — Other Ambulatory Visit: Payer: Self-pay | Admitting: Internal Medicine

## 2021-04-28 DIAGNOSIS — M797 Fibromyalgia: Secondary | ICD-10-CM

## 2021-04-28 DIAGNOSIS — M5459 Other low back pain: Secondary | ICD-10-CM | POA: Diagnosis not present

## 2021-04-28 DIAGNOSIS — M6281 Muscle weakness (generalized): Secondary | ICD-10-CM | POA: Diagnosis not present

## 2021-04-28 DIAGNOSIS — M79604 Pain in right leg: Secondary | ICD-10-CM | POA: Diagnosis not present

## 2021-04-28 DIAGNOSIS — M79605 Pain in left leg: Secondary | ICD-10-CM | POA: Diagnosis not present

## 2021-04-28 DIAGNOSIS — M0579 Rheumatoid arthritis with rheumatoid factor of multiple sites without organ or systems involvement: Secondary | ICD-10-CM

## 2021-04-28 DIAGNOSIS — M47816 Spondylosis without myelopathy or radiculopathy, lumbar region: Secondary | ICD-10-CM

## 2021-04-28 DIAGNOSIS — M069 Rheumatoid arthritis, unspecified: Secondary | ICD-10-CM | POA: Diagnosis not present

## 2021-04-28 NOTE — Telephone Encounter (Signed)
Treatment agreement on file from August 2021. RX last approved in epic on 12/07/2020 #120, 3 refills

## 2021-05-02 DIAGNOSIS — M5459 Other low back pain: Secondary | ICD-10-CM | POA: Diagnosis not present

## 2021-05-02 DIAGNOSIS — M6281 Muscle weakness (generalized): Secondary | ICD-10-CM | POA: Diagnosis not present

## 2021-05-02 DIAGNOSIS — M79604 Pain in right leg: Secondary | ICD-10-CM | POA: Diagnosis not present

## 2021-05-02 DIAGNOSIS — M069 Rheumatoid arthritis, unspecified: Secondary | ICD-10-CM | POA: Diagnosis not present

## 2021-05-02 DIAGNOSIS — M79605 Pain in left leg: Secondary | ICD-10-CM | POA: Diagnosis not present

## 2021-05-05 ENCOUNTER — Other Ambulatory Visit: Payer: Self-pay | Admitting: Nurse Practitioner

## 2021-05-05 DIAGNOSIS — M79605 Pain in left leg: Secondary | ICD-10-CM | POA: Diagnosis not present

## 2021-05-05 DIAGNOSIS — M5459 Other low back pain: Secondary | ICD-10-CM | POA: Diagnosis not present

## 2021-05-05 DIAGNOSIS — M069 Rheumatoid arthritis, unspecified: Secondary | ICD-10-CM | POA: Diagnosis not present

## 2021-05-05 DIAGNOSIS — M6281 Muscle weakness (generalized): Secondary | ICD-10-CM | POA: Diagnosis not present

## 2021-05-05 DIAGNOSIS — M79604 Pain in right leg: Secondary | ICD-10-CM | POA: Diagnosis not present

## 2021-05-05 NOTE — Telephone Encounter (Signed)
Patient has request refill on medication "Prednisone 10mg ". Patient last refill was 02/18/2021 with 1 refill. Patient is due for refill. Medication has warnings. Medication pend and sent to Mast, Man, NP due to PCP [name], MD being out of office.

## 2021-05-08 ENCOUNTER — Ambulatory Visit
Admission: RE | Admit: 2021-05-08 | Discharge: 2021-05-08 | Disposition: A | Discharge status: Home / Self Care | Payer: Medicare PPO | Source: Ambulatory Visit | Attending: Neurosurgery | Admitting: Neurosurgery

## 2021-05-08 DIAGNOSIS — M7061 Trochanteric bursitis, right hip: Secondary | ICD-10-CM

## 2021-05-08 DIAGNOSIS — M25551 Pain in right hip: Secondary | ICD-10-CM | POA: Diagnosis not present

## 2021-05-09 DIAGNOSIS — M79604 Pain in right leg: Secondary | ICD-10-CM | POA: Diagnosis not present

## 2021-05-09 DIAGNOSIS — M79605 Pain in left leg: Secondary | ICD-10-CM | POA: Diagnosis not present

## 2021-05-09 DIAGNOSIS — M5459 Other low back pain: Secondary | ICD-10-CM | POA: Diagnosis not present

## 2021-05-09 DIAGNOSIS — M069 Rheumatoid arthritis, unspecified: Secondary | ICD-10-CM | POA: Diagnosis not present

## 2021-05-09 DIAGNOSIS — M6281 Muscle weakness (generalized): Secondary | ICD-10-CM | POA: Diagnosis not present

## 2021-05-12 DIAGNOSIS — M6281 Muscle weakness (generalized): Secondary | ICD-10-CM | POA: Diagnosis not present

## 2021-05-12 DIAGNOSIS — M79604 Pain in right leg: Secondary | ICD-10-CM | POA: Diagnosis not present

## 2021-05-12 DIAGNOSIS — M5459 Other low back pain: Secondary | ICD-10-CM | POA: Diagnosis not present

## 2021-05-12 DIAGNOSIS — M069 Rheumatoid arthritis, unspecified: Secondary | ICD-10-CM | POA: Diagnosis not present

## 2021-05-12 DIAGNOSIS — M79605 Pain in left leg: Secondary | ICD-10-CM | POA: Diagnosis not present

## 2021-05-16 DIAGNOSIS — M79604 Pain in right leg: Secondary | ICD-10-CM | POA: Diagnosis not present

## 2021-05-16 DIAGNOSIS — M5459 Other low back pain: Secondary | ICD-10-CM | POA: Diagnosis not present

## 2021-05-16 DIAGNOSIS — M79605 Pain in left leg: Secondary | ICD-10-CM | POA: Diagnosis not present

## 2021-05-16 DIAGNOSIS — M069 Rheumatoid arthritis, unspecified: Secondary | ICD-10-CM | POA: Diagnosis not present

## 2021-05-16 DIAGNOSIS — M6281 Muscle weakness (generalized): Secondary | ICD-10-CM | POA: Diagnosis not present

## 2021-05-19 DIAGNOSIS — M6281 Muscle weakness (generalized): Secondary | ICD-10-CM | POA: Diagnosis not present

## 2021-05-19 DIAGNOSIS — M069 Rheumatoid arthritis, unspecified: Secondary | ICD-10-CM | POA: Diagnosis not present

## 2021-05-19 DIAGNOSIS — M79605 Pain in left leg: Secondary | ICD-10-CM | POA: Diagnosis not present

## 2021-05-19 DIAGNOSIS — M5459 Other low back pain: Secondary | ICD-10-CM | POA: Diagnosis not present

## 2021-05-19 DIAGNOSIS — M79604 Pain in right leg: Secondary | ICD-10-CM | POA: Diagnosis not present

## 2021-05-22 ENCOUNTER — Other Ambulatory Visit: Payer: Self-pay | Admitting: Internal Medicine

## 2021-05-22 DIAGNOSIS — E7800 Pure hypercholesterolemia, unspecified: Secondary | ICD-10-CM

## 2021-05-23 DIAGNOSIS — M48062 Spinal stenosis, lumbar region with neurogenic claudication: Secondary | ICD-10-CM | POA: Diagnosis not present

## 2021-05-26 DIAGNOSIS — M6281 Muscle weakness (generalized): Secondary | ICD-10-CM | POA: Diagnosis not present

## 2021-05-26 DIAGNOSIS — M069 Rheumatoid arthritis, unspecified: Secondary | ICD-10-CM | POA: Diagnosis not present

## 2021-05-26 DIAGNOSIS — M79605 Pain in left leg: Secondary | ICD-10-CM | POA: Diagnosis not present

## 2021-05-26 DIAGNOSIS — M5459 Other low back pain: Secondary | ICD-10-CM | POA: Diagnosis not present

## 2021-05-26 DIAGNOSIS — M79604 Pain in right leg: Secondary | ICD-10-CM | POA: Diagnosis not present

## 2021-05-30 DIAGNOSIS — M79605 Pain in left leg: Secondary | ICD-10-CM | POA: Diagnosis not present

## 2021-05-30 DIAGNOSIS — M5459 Other low back pain: Secondary | ICD-10-CM | POA: Diagnosis not present

## 2021-05-30 DIAGNOSIS — M069 Rheumatoid arthritis, unspecified: Secondary | ICD-10-CM | POA: Diagnosis not present

## 2021-05-30 DIAGNOSIS — M6281 Muscle weakness (generalized): Secondary | ICD-10-CM | POA: Diagnosis not present

## 2021-05-30 DIAGNOSIS — M79604 Pain in right leg: Secondary | ICD-10-CM | POA: Diagnosis not present

## 2021-06-06 ENCOUNTER — Ambulatory Visit
Admission: RE | Admit: 2021-06-06 | Discharge: 2021-06-06 | Disposition: A | Discharge status: Home / Self Care | Payer: Medicare PPO | Source: Ambulatory Visit | Attending: Nurse Practitioner | Admitting: Nurse Practitioner

## 2021-06-06 ENCOUNTER — Other Ambulatory Visit: Payer: Self-pay | Admitting: Nurse Practitioner

## 2021-06-06 ENCOUNTER — Other Ambulatory Visit: Payer: Self-pay

## 2021-06-06 DIAGNOSIS — M47816 Spondylosis without myelopathy or radiculopathy, lumbar region: Secondary | ICD-10-CM

## 2021-06-06 DIAGNOSIS — Z780 Asymptomatic menopausal state: Secondary | ICD-10-CM | POA: Diagnosis not present

## 2021-06-06 DIAGNOSIS — M0579 Rheumatoid arthritis with rheumatoid factor of multiple sites without organ or systems involvement: Secondary | ICD-10-CM

## 2021-06-06 DIAGNOSIS — M85852 Other specified disorders of bone density and structure, left thigh: Secondary | ICD-10-CM | POA: Diagnosis not present

## 2021-06-06 DIAGNOSIS — E2839 Other primary ovarian failure: Secondary | ICD-10-CM

## 2021-06-06 DIAGNOSIS — M797 Fibromyalgia: Secondary | ICD-10-CM

## 2021-06-06 NOTE — Telephone Encounter (Signed)
Tramadol last filled on 04/28/2021 in epic and treatment agreement on file from 07/16/2021.  Prednisone was last filled on 05/05/2021 for #30 only, will send to provider to review and advise if prednisone was meant for short-term or long term use.   Please review both rx's and approve if necessary

## 2021-06-07 ENCOUNTER — Encounter: Payer: Self-pay | Admitting: Nurse Practitioner

## 2021-06-07 DIAGNOSIS — M8580 Other specified disorders of bone density and structure, unspecified site: Secondary | ICD-10-CM | POA: Insufficient documentation

## 2021-06-08 ENCOUNTER — Other Ambulatory Visit: Payer: Self-pay | Admitting: Nurse Practitioner

## 2021-06-08 DIAGNOSIS — M0579 Rheumatoid arthritis with rheumatoid factor of multiple sites without organ or systems involvement: Secondary | ICD-10-CM

## 2021-06-08 DIAGNOSIS — M47816 Spondylosis without myelopathy or radiculopathy, lumbar region: Secondary | ICD-10-CM

## 2021-06-08 DIAGNOSIS — M810 Age-related osteoporosis without current pathological fracture: Secondary | ICD-10-CM

## 2021-06-08 DIAGNOSIS — M797 Fibromyalgia: Secondary | ICD-10-CM

## 2021-06-08 DIAGNOSIS — R600 Localized edema: Secondary | ICD-10-CM

## 2021-06-09 ENCOUNTER — Other Ambulatory Visit: Payer: Self-pay | Admitting: Nurse Practitioner

## 2021-06-09 DIAGNOSIS — M810 Age-related osteoporosis without current pathological fracture: Secondary | ICD-10-CM

## 2021-06-09 MED ORDER — ALENDRONATE SODIUM 70 MG PO TABS
70.0000 mg | ORAL_TABLET | ORAL | 11 refills | Status: DC
Start: 2021-06-09 — End: 2021-10-27

## 2021-06-16 ENCOUNTER — Other Ambulatory Visit: Payer: Self-pay | Admitting: Nurse Practitioner

## 2021-06-16 DIAGNOSIS — R600 Localized edema: Secondary | ICD-10-CM

## 2021-06-16 NOTE — Progress Notes (Deleted)
Office Visit Note  Patient: Eileen Wilkerson             Date of Birth: 11-22-1932           MRN: [id number]             PCP: Eileen Dad, MD Referring: Eileen Dad, MD Visit Date: 06/30/2021 Occupation: @GUAROCC @  Subjective:  No chief complaint on file.   History of Present Illness: Eileen Wilkerson is a 87 y.o. female ***   Activities of Daily Living:  Patient reports morning stiffness for *** {minute/hour:19697}.   Patient {ACTIONS;DENIES/REPORTS:21021675::"Denies"} nocturnal pain.  Difficulty dressing/grooming: {ACTIONS;DENIES/REPORTS:21021675::"Denies"} Difficulty climbing stairs: {ACTIONS;DENIES/REPORTS:21021675::"Denies"} Difficulty getting out of chair: {ACTIONS;DENIES/REPORTS:21021675::"Denies"} Difficulty using hands for taps, buttons, cutlery, and/or writing: {ACTIONS;DENIES/REPORTS:21021675::"Denies"}  No Rheumatology ROS completed.   PMFS History:  Patient Active Problem List   Diagnosis Date Noted   Osteopenia 06/07/2021   Adnexal cyst 03/03/2021   AK (actinic keratosis) 03/03/2021   UTI (urinary tract infection) 07/22/2020   Allergic rhinitis 07/22/2020   Asthma 07/08/2020   Perforation of sigmoid colon due to diverticulitis 06/23/2020   Hair loss 08/06/2019   SOB (shortness of breath) on exertion 08/06/2019   Pure hypercholesterolemia 08/06/2019   B12 deficiency 03/11/2018   E. coli UTI 01/18/2018   Edema of both lower extremities due to peripheral venous insufficiency 09/18/2017   Bilateral lower extremity edema 06/30/2017   Physical deconditioning 05/30/2017   Immunosuppressed status ([place]) 05/30/2017   Abnormal chest x-ray 05/17/2017   Fibromyalgia 12/18/2016   DJD (degenerative joint disease), cervical 12/18/2016   Spondylosis of lumbar region without myelopathy or radiculopathy 12/18/2016   Trigger finger, right middle finger 12/18/2016   Primary osteoarthritis of both feet 12/18/2016   Primary osteoarthritis of both knees  12/18/2016   GERD (gastroesophageal reflux disease) 12/18/2016   Age-related osteoporosis without current pathological fracture 12/18/2016   Rheumatoid arthritis ([place]) 12/28/2015   Long term current use of systemic steroids, for RA 12/28/2015   Lymphocytosis 11/13/2012    Past Medical History:  Diagnosis Date   Adrenal failure ([place])    Arthritis    Asthma    Cancer ([place])    Cataract    Closed nondisplaced fracture of fifth right metatarsal bone 09/18/2017   Diverticulitis    Per patient   Fibromyalgia 2008   HCAP (healthcare-associated pneumonia) 02/03/2018   Osteoporosis    RA (rheumatoid arthritis) (HCC)    Recurrent upper respiratory infection (URI)    Sepsis due to urinary tract infection ([place]) 01/18/2018   Urticaria     Family History  Problem Relation Age of Onset   Heart attack Maternal Grandmother    Heart attack Paternal Grandfather    Breast cancer Mother 68   Diabetes Father    Heart disease Father    Congestive Heart Failure Father 25       Died from   Allergic rhinitis Neg Hx    Asthma Neg Hx    Eczema Neg Hx    Urticaria Neg Hx    Past Surgical History:  Procedure Laterality Date   BACK SURGERY     BILATERAL CARPAL TUNNEL RELEASE  2005   right and left   CATARACT EXTRACTION, BILATERAL  2004   right and left   CERVICAL FUSION  2011,2010,2008   2 disks   HEEL SPUR SURGERY  2004   lower back fusion  2011   Fusion of 3-4 and 4-5 lower back   [place]  ROTATOR CUFF REPAIR  [phone]   SQUAMOUS CELL CARCINOMA EXCISION     TONSILLECTOMY AND ADENOIDECTOMY  1947   TOTAL SHOULDER ARTHROPLASTY     Social History   Social History Narrative      Diet:        Do you drink/ eat things with caffeine? Dr. Malachi Wilkerson 2/ day      Marital status: Widowed                              What year were you married ? 1953      Do you live in a house, apartment,assistred living, condo, trailer, etc.)? Apartment      Is it one or more stories?        How many persons live in your home ? 1      Do you have any pets in your home ?(please list) No      Highest Level of education completed: PhD       Current or past profession: Transport planner, Education officer, museum, Special Educator       Do you exercise?   No                           Type & how often       ADVANCED DIRECTIVES (Please bring copies)      Do you have a living will? Yes      Do you have a DNR form?                       If not, do you want to discuss one? Yes      Do you have signed POA?HPOA forms?                 If so, please bring to your appointment Tes      FUNCTIONAL STATUS- To be completed by Spouse / child / Staff       Do you have difficulty bathing or dressing yourself ? No      Do you have difficulty preparing food or eating ? No      Do you have difficulty managing your mediation ? No      Do you have difficulty managing your finances ? No      Do you have difficulty affording your medication ? No      Immunization History  Administered Date(s) Administered   Fluad Quad(high Dose 65+) 09/30/2020   Influenza, High Dose Seasonal PF 08/16/2018, 09/08/2019, 09/24/2020   Influenza,inj,Quad PF,6+ Mos 08/11/2016   Influenza-Unspecified 11/12/2017   Moderna Sars-Covid-2 Vaccination 12/15/2019, 01/12/2020, 10/19/2020   PPD Test 03/01/2018   Pneumococcal Conjugate-13 06/26/2017   Pneumococcal Polysaccharide-23 12/11/2009   Zoster Recombinat (Shingrix) 01/28/2019, 09/02/2019     Objective: Vital Signs: There were no vitals taken for this visit.   Physical Exam   Musculoskeletal Exam: ***  CDAI Exam: CDAI Score: -- Patient Global: --; Provider Global: -- Swollen: --; Tender: -- Joint Exam 06/30/2021   No joint exam has been documented for this visit   There is currently no information documented on the homunculus. Go to the Rheumatology activity and complete the homunculus joint exam.  Investigation: No additional findings.  Imaging: DG Bone  Density  Result Date: 06/06/2021 EXAM: DUAL X-RAY ABSORPTIOMETRY (DXA) FOR BONE MINERAL DENSITY IMPRESSION: Referring Physician:  Lauree Chandler Your patient completed a  bone mineral density test using GE Lunar iDXA system (analysis version: 16). Technologist: [place] PATIENT: Name: Veronda, Gabor Patient ID: [id number] Birth Date: 1931/12/28 Height: 62.5 in. Sex: Female Measured: 06/06/2021 Weight: 179.0 lbs. Indications: Adrenal Insufficiency, Advanced Age, Caucasian, Estrogen Deficient, Glucocorticoids (Chronic) (255.41), Height Loss (781.91), High risk medication use, History of Fracture (Adult) (V15.51), History of Osteoporosis, Hx of tobacco use, Postmenopausal, Prevacid, Rheumatoid Arthritis (714.0), Secondary Osteoporosis Fractures: Foot Treatments: Vitamin D (E933.5) ASSESSMENT: The BMD measured at Femur Total Left is 0.780 g/cm2 with a T-score of -1.8. This patient is considered osteopenic/low bone mass according to [facility]) criteria. The quality of the exam is good. The lumbar spine was excluded due to degenerative changes. Site Region Measured Date Measured Age YA BMD Significant CHANGE T-score DualFemur Total Left 06/06/2023 86.7 -1.10 0.780 g/cm2 * DualFemur Total Left 12/27/2017 83.3 -1.4 0.829 g/cm2 DualFemur Total Mean 06/06/2021 86.7 -1.8 0.787 g/cm2 * DualFemur Total Mean 12/27/2017 83.3 -1.4 0.827 g/cm2 Left Forearm Radius 33% 06/06/2021 86.7 0.0 0.891 g/cm2 Left Forearm Radius 33% 12/27/2017 83.3 0.0 0.887 g/cm2 World Health Organization [facility]) criteria for post-menopausal, Caucasian Women: Normal       T-score at or above -1 SD Osteopenia   T-score between -1 and -2.5 SD Osteoporosis T-score at or below -2.5 SD RECOMMENDATION: 1. All patients should optimize calcium and vitamin D intake. 2. Consider FDA-approved medical therapies in postmenopausal women and men aged 53 years and older, based on the following: a. A hip or vertebral (clinical or morphometric) fracture. b.  T-score = -2.5 at the femoral neck or spine after appropriate evaluation to exclude secondary causes. c. Low bone mass (T-score between -1.0 and -2.5 at the femoral neck or spine) and a 10-year probability of a hip fracture = 3% or a 10-year probability of a major osteoporosis-related fracture = 20% based on the [place]-adapted WHO algorithm. d. Clinician judgment and/or patient preferences may indicate treatment for people with 10-year fracture probabilities above or below these levels. FOLLOW-UP: Patients with diagnosis of osteoporosis or at high risk for fracture should have regular bone mineral density tests.? Patients eligible for Medicare are allowed routine testing every 2 years.? The testing frequency can be increased to one year for patients who have rapidly progressing disease, are receiving or discontinuing medical therapy to restore bone mass, or have additional risk factors. I have reviewed this study and agree with the findings. [facility] FRAX* 10-year Probability of Fracture Based on femoral neck BMD: DualFemur (Left) Major Osteoporotic Fracture: 30.7% Hip Fracture:                9.8% Population:                  [place] (Caucasian) Risk Factors: Glucocorticoids (Chronic) (255.41), History of Fracture (Adult) (V15.51), Rheumatoid Arthritis (714.0), Secondary Osteoporosis *FRAX is a Materials engineer of the [employer] of [employer] for Metabolic Bone Disease, a World Pharmacologist (WHO) [facility]. ASSESSMENT: The probability of a major osteoporotic fracture is 30.7% within the next ten years. The probability of a hip fracture is 9.8% within the next ten years. Electronically Signed   By: Lowella Grip III M.D.   On: 06/06/2021 15:48    Recent Labs: Lab Results  Component Value Date   WBC 15.0 (H) 02/03/2021   HGB 13.5 02/03/2021   PLT 272 02/03/2021   NA 141 02/03/2021   K 4.3 02/03/2021   CL 100 02/03/2021   CO2 33 (H)  02/03/2021   GLUCOSE 103  (H) 02/03/2021   BUN 23 02/03/2021   CREATININE 1.05 (H) 02/03/2021   BILITOT 0.6 02/03/2021   ALKPHOS 43 06/25/2020   AST 14 02/03/2021   ALT 11 02/03/2021   PROT 7.0 02/03/2021   ALBUMIN 2.7 (L) 06/25/2020   CALCIUM 9.8 02/03/2021   GFRAA 56 (L) 02/03/2021    Speciality Comments: PLQ eye exam:10/22/18 Normal. [facility] Follow up in 6 months. Prior therapy: MTX (recurrent infections) Therapy Contraindications: Anti-TNF's due to CHF  Procedures:  No procedures performed Allergies: Adhesive [tape], Celebrex [celecoxib], Ciprofibrate, Cymbalta [duloxetine hcl], Gabitril [tiagabine], Keflex [cephalexin], Lyrica [pregabalin], Neurontin [gabapentin], Nexium [esomeprazole], Nsaids, Penicillins, Shrimp [shellfish allergy], Sulfa antibiotics, Azactam [aztreonam], Azelastine hcl, Ciprofloxacin, Claritin [loratadine], Methotrexate derivatives, Nasacort [triamcinolone], Olopatadine, Other, Sulfamethizole, Zantac [ranitidine hcl], and Claritin-d 12 hour [loratadine-pseudoephedrine er]   Assessment / Plan:     Visit Diagnoses: No diagnosis found.  Orders: No orders of the defined types were placed in this encounter.  No orders of the defined types were placed in this encounter.   Face-to-face time spent with patient was *** minutes. Greater than 50% of time was spent in counseling and coordination of care.  Follow-Up Instructions: No follow-ups on file.   Earnestine Mealing, CMA  Note - This record has been created using Editor, commissioning.  Chart creation errors have been sought, but may not always  have been located. Such creation errors do not reflect on  the standard of medical care.

## 2021-06-20 DIAGNOSIS — M48062 Spinal stenosis, lumbar region with neurogenic claudication: Secondary | ICD-10-CM | POA: Diagnosis not present

## 2021-06-20 DIAGNOSIS — I872 Venous insufficiency (chronic) (peripheral): Secondary | ICD-10-CM | POA: Diagnosis not present

## 2021-06-20 DIAGNOSIS — M160 Bilateral primary osteoarthritis of hip: Secondary | ICD-10-CM | POA: Diagnosis not present

## 2021-06-20 DIAGNOSIS — E538 Deficiency of other specified B group vitamins: Secondary | ICD-10-CM | POA: Diagnosis not present

## 2021-06-21 ENCOUNTER — Other Ambulatory Visit: Payer: Self-pay

## 2021-06-21 DIAGNOSIS — I872 Venous insufficiency (chronic) (peripheral): Secondary | ICD-10-CM

## 2021-06-21 DIAGNOSIS — E538 Deficiency of other specified B group vitamins: Secondary | ICD-10-CM

## 2021-06-22 LAB — COMPLETE METABOLIC PANEL WITH GFR
AG Ratio: 1.3 (calc) (ref 1.0–2.5)
ALT: 15 U/L (ref 6–29)
AST: 16 U/L (ref 10–35)
Albumin: 4.2 g/dL (ref 3.6–5.1)
Alkaline phosphatase (APISO): 50 U/L (ref 37–153)
BUN/Creatinine Ratio: 25 (calc) — ABNORMAL HIGH (ref 6–22)
BUN: 29 mg/dL — ABNORMAL HIGH (ref 7–25)
CO2: 29 mmol/L (ref 20–32)
Calcium: 10.2 mg/dL (ref 8.6–10.4)
Chloride: 103 mmol/L (ref 98–110)
Creat: 1.14 mg/dL — ABNORMAL HIGH (ref 0.60–0.95)
Globulin: 3.2 g/dL (calc) (ref 1.9–3.7)
Glucose, Bld: 75 mg/dL (ref 65–99)
Potassium: 4 mmol/L (ref 3.5–5.3)
Sodium: 145 mmol/L (ref 135–146)
Total Bilirubin: 0.5 mg/dL (ref 0.2–1.2)
Total Protein: 7.4 g/dL (ref 6.1–8.1)
eGFR: 47 mL/min/{1.73_m2} — ABNORMAL LOW (ref >=60)

## 2021-06-22 LAB — VITAMIN B12: Vitamin B-12: 235 pg/mL (ref 200–1100)

## 2021-06-23 ENCOUNTER — Encounter: Payer: Self-pay | Admitting: Nurse Practitioner

## 2021-06-23 ENCOUNTER — Other Ambulatory Visit: Payer: Self-pay

## 2021-06-23 ENCOUNTER — Other Ambulatory Visit: Payer: Self-pay | Admitting: Internal Medicine

## 2021-06-23 ENCOUNTER — Non-Acute Institutional Stay: Payer: Medicare PPO | Admitting: Nurse Practitioner

## 2021-06-23 VITALS — BP 128/80 | HR 100 | Temp 96.6°F | Resp 20 | Ht 64.0 in | Wt 180.4 lb

## 2021-06-23 DIAGNOSIS — E538 Deficiency of other specified B group vitamins: Secondary | ICD-10-CM | POA: Diagnosis not present

## 2021-06-23 DIAGNOSIS — I872 Venous insufficiency (chronic) (peripheral): Secondary | ICD-10-CM | POA: Diagnosis not present

## 2021-06-23 DIAGNOSIS — K219 Gastro-esophageal reflux disease without esophagitis: Secondary | ICD-10-CM | POA: Diagnosis not present

## 2021-06-23 DIAGNOSIS — E7800 Pure hypercholesterolemia, unspecified: Secondary | ICD-10-CM

## 2021-06-23 DIAGNOSIS — J45909 Unspecified asthma, uncomplicated: Secondary | ICD-10-CM

## 2021-06-23 DIAGNOSIS — R300 Dysuria: Secondary | ICD-10-CM

## 2021-06-23 DIAGNOSIS — M0579 Rheumatoid arthritis with rheumatoid factor of multiple sites without organ or systems involvement: Secondary | ICD-10-CM | POA: Diagnosis not present

## 2021-06-23 DIAGNOSIS — M8580 Other specified disorders of bone density and structure, unspecified site: Secondary | ICD-10-CM

## 2021-06-23 DIAGNOSIS — K5720 Diverticulitis of large intestine with perforation and abscess without bleeding: Secondary | ICD-10-CM

## 2021-06-23 MED ORDER — VITAMIN B-12 1000 MCG PO TABS: 1000.0000 ug | ORAL_TABLET | Freq: Every day | ORAL | 5 refills | Status: DC

## 2021-06-23 NOTE — Assessment & Plan Note (Signed)
venous insufficiency/Edema , takes Furosemide 80mg  qd, Kcl. Bun/creat 29/1.14 06/20/21

## 2021-06-23 NOTE — Assessment & Plan Note (Signed)
burning sensation upon urination, got better took Doxycycline on her own, denied abd pain, nausea, vomiting, or fever. Baseline urinary incontinence has no change. Will obtain urine culture to r/o UTI

## 2021-06-23 NOTE — Progress Notes (Signed)
Location:   clinic [facility]   Place of Service:  Clinic (12) Provider: Marlana Latus NP  Code Status: DNR Goals of Care: [place] 06/23/2021  Does Patient Have a Medical Advance Directive? Yes  Type of Advance Directive [place]  Does patient want to make changes to medical advance directive? No - Patient declined  Copy of [place] in Chart? Yes - validated most recent copy scanned in chart (See row information)  Would patient like information on creating a medical advance directive? -     Chief Complaint  Patient presents with   Medical Management of Chronic Issues    4 month follow up. Patient complains of some burning with urination.      HPI: Patient is a 87 y.o. female seen today for burning sensation upon urination, got better took Doxycycline on her own, denied abd pain, nausea, vomiting, or fever.     Hx of aute sigmoid diverticulitis with pneumoperitoneum, treated with  Omnicef/Falgyl 06/2020, f/u Dr. [name]             Asthma Epinephrine prn, Allegra, Flonase, Singulair             RA/fibromyalgia,  takes Prednisone, Tramadol, TSH 2.04 07/27/20             GERD Prevacid 14m qd, Hgb 13.5 02/03/21             Venous insufficiency/Edema , takes Furosemide 8110mqd, Kcl. Bun/creat 29/1.14 06/20/21             OP Rheumatology, takes Alendronate, Vit D, t score -1.8 06/06/21  Hyperlipidemia, takes Atorvastatin             Vit B12 deficiency, try Vit B12 po, Vit B12 235 06/20/21             CT left adnexa cyst, f/u [place].    Past Medical History:  Diagnosis Date   Adrenal failure (H[place]   Arthritis    Asthma    Cancer (HCM[place]   Cataract    Closed nondisplaced fracture of fifth right metatarsal bone 09/18/2017   Diverticulitis    Per patient   Fibromyalgia 2008   HCAP (healthcare-associated pneumonia) 02/03/2018   Osteoporosis    RA (rheumatoid arthritis) (HCC)    Recurrent upper respiratory infection (URI)     Sepsis due to urinary tract infection (HCRamona2/07/2018   Urticaria     Past Surgical History:  Procedure Laterality Date   BACK SURGERY     BILATERAL CARPAL TUNNEL RELEASE  2005   right and left   CATARACT EXTRACTION, BILATERAL  2004   right and left   CERVICAL FUSION  2011,2010,2008   2 disks   HEEL SPUR SURGERY  2004   lower back fusion  2011   Fusion of 3-4 and 4-5 lower back   RADIOFREQUENCY ABLATION  2020   ROTATOR CUFF REPAIR  [id number] SQUAMOUS CELL CARCINOMA EXCISION     TONSILLECTOMY AND ADENOIDECTOMY  1947   TOTAL SHOULDER ARTHROPLASTY      Allergies  Allergen Reactions   Adhesive [Tape] Rash   Celebrex [Celecoxib] Hives   Ciprofibrate Nausea Only   Cymbalta [Duloxetine Hcl] Swelling   Gabitril [Tiagabine] Swelling   Keflex [Cephalexin] Nausea And Vomiting   Lyrica [Pregabalin] Swelling   Neurontin [Gabapentin] Swelling   Nexium [Esomeprazole] Rash   Nsaids Rash   Penicillins Rash    Injection site reaction.  Tolerated cefepime in past Has patient had a PCN reaction causing immediate rash, facial/tongue/throat swelling, SOB or lightheadedness with hypotension: No Has patient had a PCN reaction causing severe rash involving mucus membranes or skin necrosis: No Has patient had a PCN reaction that required hospitalization No Has patient had a PCN reaction occurring within the last 10 years: No If all of the above answers are "NO", then may proceed with Cephalosporin use.    Shrimp [Shellfish Allergy] Anaphylaxis    Per patient "shrimp only"   Sulfa Antibiotics Nausea And Vomiting   Azactam [Aztreonam]     Hand swelling    Azelastine Hcl     Rash    Ciprofloxacin Other (See Comments)    dizziness   Claritin [Loratadine]     Irritability Nervousness    Methotrexate Derivatives    Nasacort [Triamcinolone]     Dizzy    Olopatadine Other (See Comments)    Pain and lethargy    Other    Sulfamethizole Other (See Comments)    unknown   Zantac [Ranitidine  Hcl] Other (See Comments)    unknown   Claritin-D 12 Hour [Loratadine-Pseudoephedrine Er] Anxiety    Allergies as of 06/23/2021       Reactions   Adhesive [tape] Rash   Celebrex [celecoxib] Hives   Ciprofibrate Nausea Only   Cymbalta [duloxetine Hcl] Swelling   Gabitril [tiagabine] Swelling   Keflex [cephalexin] Nausea And Vomiting   Lyrica [pregabalin] Swelling   Neurontin [gabapentin] Swelling   Nexium [esomeprazole] Rash   Nsaids Rash   Penicillins Rash   Injection site reaction. Tolerated cefepime in past Has patient had a PCN reaction causing immediate rash, facial/tongue/throat swelling, SOB or lightheadedness with hypotension: No Has patient had a PCN reaction causing severe rash involving mucus membranes or skin necrosis: No Has patient had a PCN reaction that required hospitalization No Has patient had a PCN reaction occurring within the last 10 years: No If all of the above answers are "NO", then may proceed with Cephalosporin use.   Shrimp [shellfish Allergy] Anaphylaxis   Per patient "shrimp only"   Sulfa Antibiotics Nausea And Vomiting   Azactam [aztreonam]    Hand swelling    Azelastine Hcl    Rash    Ciprofloxacin Other (See Comments)   dizziness   Claritin [loratadine]    Irritability Nervousness    Methotrexate Derivatives    Nasacort [triamcinolone]    Dizzy    Olopatadine Other (See Comments)   Pain and lethargy    Other    Sulfamethizole Other (See Comments)   unknown   Zantac [ranitidine Hcl] Other (See Comments)   unknown   Claritin-d 12 Hour [loratadine-pseudoephedrine Er] Anxiety        Medication List        Accurate as of June 23, 2021  4:23 PM. If you have any questions, ask your nurse or doctor.          STOP taking these medications    Cyanocobalamin 1000 MCG/ML Kit Replaced by: vitamin B-12 1000 MCG tablet Stopped by: Jaycen Vercher X Nadiah Corbit, NP       TAKE these medications    acetaminophen 500 MG tablet Commonly known as:  TYLENOL Take 500 mg by mouth every 6 (six) hours.   alendronate 70 MG tablet Commonly known as: Fosamax Take 1 tablet (70 mg total) by mouth every 7 (seven) days. Take with a full glass of water on an empty stomach.   atorvastatin 20 MG  tablet Commonly known as: LIPITOR Take 1 tablet (20 mg total) by mouth daily. Labs overdue   Biotin 10 MG Tabs Take 10 mg by mouth daily.   cefpodoxime 100 MG tablet Commonly known as: VANTIN Take 1 tablet (100 mg total) by mouth 2 (two) times daily. For 7 days   CRANBERRY PO Take 1 capsule by mouth daily.   cycloSPORINE 0.05 % ophthalmic emulsion Commonly known as: RESTASIS Place 1 drop into both eyes 2 (two) times daily.   diclofenac Sodium 1 % Gel Commonly known as: VOLTAREN APPLY 2 TO 4 GRAMS TOPICALLY TO AFFECTED JOINTS UP TO 4 TIMES DAILY   doxycycline 100 MG tablet Commonly known as: VIBRA-TABS Take 1 tablet (100 mg total) by mouth 2 (two) times daily.   EPINEPHrine 0.3 mg/0.3 mL Soaj injection Commonly known as: EPI-PEN Inject 0.3 mg into the muscle as needed for anaphylaxis.   fexofenadine 180 MG tablet Commonly known as: ALLEGRA Take 180 mg by mouth daily.   fluticasone 50 MCG/ACT nasal spray Commonly known as: FLONASE SPRAY 2 SPRAYS INTO EACH NOSTRIL EVERY DAY   furosemide 80 MG tablet Commonly known as: LASIX Take 1 tablet (80 mg total) by mouth daily.   ketoconazole 2 % shampoo Commonly known as: NIZORAL Apply 1 application topically once a week.   lansoprazole 30 MG capsule Commonly known as: PREVACID TAKE 1 CAPSULE BY MOUTH EVERY DAY AT 12 NOON   MELATONIN PO Take 10 mg by mouth daily.   montelukast 10 MG tablet Commonly known as: SINGULAIR TAKE 1 TABLET BY MOUTH EVERYDAY AT BEDTIME   potassium chloride 10 MEQ tablet Commonly known as: KLOR-CON Take 2 tablets by mouth twice daily   predniSONE 10 MG tablet Commonly known as: DELTASONE Take 1 tablet by mouth once daily with breakfast   SYRINGE  3CC/25GX1" 25G X 1" 3 ML Misc 1 application by Does not apply route every 30 (thirty) days.   traMADol 50 MG tablet Commonly known as: ULTRAM Take 1 tablet by mouth 4 times daily   vitamin B-12 1000 MCG tablet Commonly known as: CYANOCOBALAMIN Take 1 tablet (1,000 mcg total) by mouth daily. Replaces: Cyanocobalamin 1000 MCG/ML Kit Started by: Samanda Buske X Jameeka Marcy, NP   Vitamin D3 125 MCG (5000 UT) Caps Take 5,000 Units by mouth daily.        Review of Systems:  Review of Systems  Constitutional:  Negative for appetite change, fatigue and fever.  HENT:  Negative for congestion and voice change.   Eyes:  Negative for visual disturbance.  Respiratory:  Negative for cough, shortness of breath and wheezing.   Cardiovascular:  Negative for leg swelling.  Gastrointestinal:  Negative for abdominal pain and constipation.  Genitourinary:  Positive for dysuria. Negative for difficulty urinating and urgency.       Incontinent of urine.   Musculoskeletal:  Positive for arthralgias, back pain, gait problem and myalgias.       Right lower back hip pain, travels down to the right le.   Skin:  Negative for color change.  Neurological:  Negative for speech difficulty, weakness and light-headedness.  Psychiatric/Behavioral:  Negative for confusion and sleep disturbance. The patient is not nervous/anxious.    Health Maintenance  Topic Date Due   TETANUS/TDAP  Never done   COVID-19 Vaccine (4 - Booster for Moderna series) 01/19/2021   INFLUENZA VACCINE  07/11/2021   DEXA SCAN  Completed   PNA vac Low Risk Adult  Completed   Zoster Vaccines- Shingrix  Completed   HPV VACCINES  Aged Out    Physical Exam: Vitals:   06/23/21 1526  BP: 128/80  Pulse: 100  Resp: 20  Temp: (!) 96.6 F (35.9 C)  SpO2: 99%  Weight: 180 lb 6.4 oz (81.8 kg)  Height: _0  (1.626 m)   Body mass index is 30.97 kg/m. Physical Exam Vitals and nursing note reviewed.  Constitutional:      Appearance: Normal  appearance.  HENT:     Head: Normocephalic and atraumatic.     Mouth/Throat:     Mouth: Mucous membranes are moist.  Eyes:     Extraocular Movements: Extraocular movements intact.     Conjunctiva/sclera: Conjunctivae normal.     Pupils: Pupils are equal, round, and reactive to light.  Neck:     Comments: C/o left upper back/neck pain, better with warm compress, will work with therapy Cardiovascular:     Rate and Rhythm: Normal rate and regular rhythm.     Heart sounds: No murmur heard. Pulmonary:     Breath sounds: Normal breath sounds. No rales.  Abdominal:     General: Bowel sounds are normal.     Palpations: Abdomen is soft.     Tenderness: There is no abdominal tenderness. There is no right CVA tenderness, left CVA tenderness, guarding or rebound.  Musculoskeletal:     Cervical back: Normal range of motion and neck supple.     Right lower leg: No edema.     Left lower leg: No edema.  Skin:    General: Skin is warm and dry.  Neurological:     General: No focal deficit present.     Mental Status: She is alert and oriented to person, place, and time. Mental status is at baseline.     Motor: No weakness.     Coordination: Coordination normal.     Gait: Gait abnormal.  Psychiatric:        Mood and Affect: Mood normal.        Behavior: Behavior normal.        Thought Content: Thought content normal.        Judgment: Judgment normal.    Labs reviewed: Basic Metabolic Panel: Recent Labs    07/19/20 1438 07/27/20 1107 02/03/21 1422 06/20/21 0920  NA 143  --  141 145  K 3.6  --  4.3 4.0  CL 104  --  100 103  CO2 30  --  33* 29  GLUCOSE 76  --  103* 75  BUN 22  --  23 29*  CREATININE 1.01*  --  1.05* 1.14*  CALCIUM 10.2  --  9.8 10.2  TSH  --  2.04  --   --    Liver Function Tests: Recent Labs    06/25/20 0941 07/13/20 0710 07/19/20 1438 02/03/21 1422 06/20/21 0920  AST 20   < > _1 ALT 14   < > _2 ALKPHOS 43  --   --   --   --   BILITOT 1.1    < > 0.5 0.6 0.5  PROT 6.3*   < > 7.1 7.0 7.4  ALBUMIN 2.7*  --   --   --   --    < > = values in this interval not displayed.   Recent Labs    07/19/20 1438  LIPASE 23   No results for input(s): AMMONIA in the last 8760 hours. CBC: Recent Labs    07/13/20  0710 07/19/20 1438 02/03/21 1422  WBC 13.7* 10.5 15.0*  NEUTROABS 8,234* 5,523 12,645*  HGB 13.9 13.2 13.5  HCT 42.0 40.5 41.0  MCV 95.0 96.4 93.8  PLT 354 278 272   Lipid Panel: No results for input(s): CHOL, HDL, LDLCALC, TRIG, CHOLHDL, LDLDIRECT in the last 8760 hours. No results found for: HGBA1C  Procedures since last visit: DG Bone Density  Result Date: 06/06/2021 EXAM: DUAL X-RAY ABSORPTIOMETRY (DXA) FOR BONE MINERAL DENSITY IMPRESSION: Referring Physician:  Lauree Chandler Your patient completed a bone mineral density test using GE Lunar iDXA system (analysis version: 16). Technologist: [place] PATIENT: Name: Eileen Wilkerson, Eileen Wilkerson Patient ID: [id number] Birth Date: December 12, 1931 Height: 62.5 in. Sex: Female Measured: 06/06/2021 Weight: 179.0 lbs. Indications: Adrenal Insufficiency, Advanced Age, Caucasian, Estrogen Deficient, Glucocorticoids (Chronic) (255.41), Height Loss (781.91), High risk medication use, History of Fracture (Adult) (V15.51), History of Osteoporosis, Hx of tobacco use, Postmenopausal, Prevacid, Rheumatoid Arthritis (714.0), Secondary Osteoporosis Fractures: Foot Treatments: Vitamin D (E933.5) ASSESSMENT: The BMD measured at Femur Total Left is 0.780 g/cm2 with a T-score of -1.8. This patient is considered osteopenic/low bone mass according to [facility]) criteria. The quality of the exam is good. The lumbar spine was excluded due to degenerative changes. Site Region Measured Date Measured Age YA BMD Significant CHANGE T-score DualFemur Total Left 06/06/2021 86.7 -1.8 0.780 g/cm2 * DualFemur Total Left [birth year]/27/2017 83.3 -1.4 0.829 g/cm2 DualFemur Total Mean 06/06/2021 86.7 -1.8 0.787 g/cm2 *  DualFemur Total Mean 12/27/2017 83.3 -1.4 0.827 g/cm2 Left Forearm Radius 33% 06/06/2021 86.7 0.0 0.891 g/cm2 Left Forearm Radius 33% 12/27/2017 83.3 0.0 0.887 g/cm2 World Health Organization [facility]) criteria for post-menopausal, Caucasian Women: Normal       T-score at or above -1 SD Osteopenia   T-score between -1 and -2.5 SD Osteoporosis T-score at or below -2.5 SD RECOMMENDATION: 1. All patients should optimize calcium and vitamin D intake. 2. Consider FDA-approved medical therapies in postmenopausal women and men aged 46 years and older, based on the following: a. A hip or vertebral (clinical or morphometric) fracture. b. T-score = -2.5 at the femoral neck or spine after appropriate evaluation to exclude secondary causes. c. Low bone mass (T-score between -1.0 and -2.5 at the femoral neck or spine) and a 10-year probability of a hip fracture = 3% or a 10-year probability of a major osteoporosis-related fracture = 20% based on the [place]-adapted WHO algorithm. d. Clinician judgment and/or patient preferences may indicate treatment for people with 10-year fracture probabilities above or below these levels. FOLLOW-UP: Patients with diagnosis of osteoporosis or at high risk for fracture should have regular bone mineral density tests.? Patients eligible for Medicare are allowed routine testing every 2 years.? The testing frequency can be increased to one year for patients who have rapidly progressing disease, are receiving or discontinuing medical therapy to restore bone mass, or have additional risk factors. I have reviewed this study and agree with the findings. [facility] FRAX* 10-year Probability of Fracture Based on femoral neck BMD: DualFemur (Left) Major Osteoporotic Fracture: 30.7% Hip Fracture:                9.8% Population:                  [place] (Caucasian) Risk Factors: Glucocorticoids (Chronic) (255.41), History of Fracture (Adult) (V15.51), Rheumatoid Arthritis (714.0), Secondary Osteoporosis  *FRAX is a Materials engineer of the [employer] for Metabolic Bone Disease, a Lexicographer (WHO)  [place]. ASSESSMENT: The probability of a major osteoporotic fracture is 30.7% within the next ten years. The probability of a hip fracture is 9.8% within the next ten years. Electronically Signed   By: Lowella Grip III M.D.   On: 06/06/2021 15:48    Assessment/Plan  Asthma Epinephrine prn, Allegra, Flonase, Singulair  Rheumatoid arthritis (HCC) RA/fibromyalgia,  takes Prednisone, Tramadol, TSH 2.04 07/27/20, wbc 15 02/03/21  GERD (gastroesophageal reflux disease) Prevacid 48m qd, Hgb 13.5 02/03/21  Edema of both lower extremities due to peripheral venous insufficiency venous insufficiency/Edema , takes Furosemide 850mqd, Kcl. Bun/creat 29/1.14 06/20/21  Osteopenia Rheumatology, takes Alendronate, Vit D, t score -1.8. 06/06/21 DEXA t score -1.8 10 years probability hip fx 9.8%, osteoporotic fx 30.7%, recommend tx per WHO  Pure hypercholesterolemia takes Atorvastatin  B12 deficiency Will try Vit B12 100035mpo qd, repeat Vit B12 level in 3 months.   Perforation of sigmoid colon due to diverticulitis Hx of aute sigmoid diverticulitis with pneumoperitoneum, treated with  Omnicef/Falgyl 06/2020, f/u Dr. KinKieth Brightlyysuria burning sensation upon urination, got better took Doxycycline on her own, denied abd pain, nausea, vomiting, or fever. Baseline urinary incontinence has no change. Will obtain urine culture to r/o UTI   Labs/tests ordered:  Urine culture.   Next appt:  4 months

## 2021-06-23 NOTE — Assessment & Plan Note (Signed)
takes Atorvastatin  

## 2021-06-23 NOTE — Assessment & Plan Note (Signed)
RA/fibromyalgia,  takes Prednisone, Tramadol, TSH 2.04 07/27/20, wbc 15 02/03/21

## 2021-06-23 NOTE — Assessment & Plan Note (Addendum)
Rheumatology, takes Alendronate, Vit D, t score -1.8. 06/06/21 DEXA t score -1.8 10 years probability hip fx 9.8%, osteoporotic fx 30.7%, recommend tx per [facility]

## 2021-06-23 NOTE — Assessment & Plan Note (Signed)
Hx of aute sigmoid diverticulitis with pneumoperitoneum, treated with  Omnicef/Falgyl 06/2020, f/u Dr. [name]

## 2021-06-23 NOTE — Assessment & Plan Note (Signed)
Prevacid 30mg  qd, Hgb 13.5 02/03/21

## 2021-06-23 NOTE — Assessment & Plan Note (Addendum)
Will try Vit B12 1063mcg po qd, repeat Vit B12 level in 3 months.

## 2021-06-23 NOTE — Assessment & Plan Note (Signed)
Epinephrine prn, Allegra, Flonase, Singulair 

## 2021-06-24 ENCOUNTER — Encounter: Payer: Self-pay | Admitting: Internal Medicine

## 2021-06-27 ENCOUNTER — Other Ambulatory Visit: Payer: Self-pay | Admitting: *Deleted

## 2021-06-27 DIAGNOSIS — K219 Gastro-esophageal reflux disease without esophagitis: Secondary | ICD-10-CM

## 2021-06-27 NOTE — Telephone Encounter (Signed)
Pharmacy requested refill.  °Pended Rx and sent to Dr. Gupta for approval due to HIGH ALERT Warning.  °

## 2021-06-28 ENCOUNTER — Other Ambulatory Visit: Payer: Medicare PPO

## 2021-06-28 ENCOUNTER — Other Ambulatory Visit: Payer: Self-pay

## 2021-06-28 DIAGNOSIS — R300 Dysuria: Secondary | ICD-10-CM

## 2021-06-29 ENCOUNTER — Encounter: Payer: Self-pay | Admitting: Family Medicine

## 2021-06-29 ENCOUNTER — Other Ambulatory Visit: Payer: Self-pay | Admitting: *Deleted

## 2021-06-29 DIAGNOSIS — K219 Gastro-esophageal reflux disease without esophagitis: Secondary | ICD-10-CM

## 2021-06-29 MED ORDER — LANSOPRAZOLE 30 MG PO CPDR: DELAYED_RELEASE_CAPSULE | ORAL | 1 refills | Status: DC

## 2021-06-29 NOTE — Telephone Encounter (Signed)
Pharmacy requested refill  Pended Rx and sent to [facility] for approval due to [place].

## 2021-06-30 ENCOUNTER — Ambulatory Visit: Payer: Medicare PPO | Admitting: Rheumatology

## 2021-06-30 DIAGNOSIS — M0579 Rheumatoid arthritis with rheumatoid factor of multiple sites without organ or systems involvement: Secondary | ICD-10-CM

## 2021-06-30 DIAGNOSIS — E2740 Unspecified adrenocortical insufficiency: Secondary | ICD-10-CM

## 2021-06-30 DIAGNOSIS — M797 Fibromyalgia: Secondary | ICD-10-CM

## 2021-06-30 DIAGNOSIS — Z87898 Personal history of other specified conditions: Secondary | ICD-10-CM

## 2021-06-30 DIAGNOSIS — M19072 Primary osteoarthritis, left ankle and foot: Secondary | ICD-10-CM

## 2021-06-30 DIAGNOSIS — Z79899 Other long term (current) drug therapy: Secondary | ICD-10-CM

## 2021-06-30 DIAGNOSIS — M810 Age-related osteoporosis without current pathological fracture: Secondary | ICD-10-CM

## 2021-06-30 DIAGNOSIS — M5030 Other cervical disc degeneration, unspecified cervical region: Secondary | ICD-10-CM

## 2021-06-30 DIAGNOSIS — M170 Bilateral primary osteoarthritis of knee: Secondary | ICD-10-CM

## 2021-06-30 DIAGNOSIS — Z8679 Personal history of other diseases of the circulatory system: Secondary | ICD-10-CM

## 2021-06-30 DIAGNOSIS — M19041 Primary osteoarthritis, right hand: Secondary | ICD-10-CM

## 2021-07-01 ENCOUNTER — Other Ambulatory Visit: Payer: Self-pay | Admitting: Nurse Practitioner

## 2021-07-01 LAB — URINE CULTURE
MICRO NUMBER:: 12140431
SPECIMEN QUALITY:: ADEQUATE

## 2021-07-02 ENCOUNTER — Other Ambulatory Visit: Payer: Self-pay | Admitting: Orthopedic Surgery

## 2021-07-02 DIAGNOSIS — N3000 Acute cystitis without hematuria: Secondary | ICD-10-CM

## 2021-07-02 MED ORDER — CIPROFLOXACIN HCL 500 MG PO TABS: 500.0000 mg | ORAL_TABLET | Freq: Two times a day (BID) | ORAL | 0 refills | Status: AC

## 2021-07-02 NOTE — Progress Notes (Signed)
Urine culture discussed with patient.She still is having some dysuria.Treatment options discussed. Allergy list verified with patient.  Reports Cipro might have made her dizzy in the past, but she is unsure. She denies anaphylactic reaction to Cipro in the past.  She will stop cipro if she feels any sign of dizziness and contact provider.

## 2021-07-04 DIAGNOSIS — R42 Dizziness and giddiness: Secondary | ICD-10-CM | POA: Diagnosis not present

## 2021-07-04 DIAGNOSIS — H903 Sensorineural hearing loss, bilateral: Secondary | ICD-10-CM | POA: Diagnosis not present

## 2021-07-04 DIAGNOSIS — A388 Scarlet fever with other complications: Secondary | ICD-10-CM | POA: Diagnosis not present

## 2021-07-07 ENCOUNTER — Other Ambulatory Visit: Payer: Self-pay | Admitting: Nurse Practitioner

## 2021-07-07 DIAGNOSIS — M0579 Rheumatoid arthritis with rheumatoid factor of multiple sites without organ or systems involvement: Secondary | ICD-10-CM

## 2021-07-07 DIAGNOSIS — M47816 Spondylosis without myelopathy or radiculopathy, lumbar region: Secondary | ICD-10-CM

## 2021-07-07 DIAGNOSIS — M797 Fibromyalgia: Secondary | ICD-10-CM

## 2021-07-14 DIAGNOSIS — M1611 Unilateral primary osteoarthritis, right hip: Secondary | ICD-10-CM | POA: Diagnosis not present

## 2021-07-21 DIAGNOSIS — M1990 Unspecified osteoarthritis, unspecified site: Secondary | ICD-10-CM | POA: Diagnosis not present

## 2021-07-21 DIAGNOSIS — E669 Obesity, unspecified: Secondary | ICD-10-CM | POA: Diagnosis not present

## 2021-07-21 DIAGNOSIS — K219 Gastro-esophageal reflux disease without esophagitis: Secondary | ICD-10-CM | POA: Diagnosis not present

## 2021-07-21 DIAGNOSIS — M069 Rheumatoid arthritis, unspecified: Secondary | ICD-10-CM | POA: Diagnosis not present

## 2021-07-21 DIAGNOSIS — J309 Allergic rhinitis, unspecified: Secondary | ICD-10-CM | POA: Diagnosis not present

## 2021-07-21 DIAGNOSIS — G4700 Insomnia, unspecified: Secondary | ICD-10-CM | POA: Diagnosis not present

## 2021-07-21 DIAGNOSIS — E785 Hyperlipidemia, unspecified: Secondary | ICD-10-CM | POA: Diagnosis not present

## 2021-07-21 DIAGNOSIS — G8929 Other chronic pain: Secondary | ICD-10-CM | POA: Diagnosis not present

## 2021-07-21 DIAGNOSIS — E2740 Unspecified adrenocortical insufficiency: Secondary | ICD-10-CM | POA: Diagnosis not present

## 2021-08-08 ENCOUNTER — Other Ambulatory Visit: Payer: Self-pay | Admitting: Nurse Practitioner

## 2021-08-08 DIAGNOSIS — M0579 Rheumatoid arthritis with rheumatoid factor of multiple sites without organ or systems involvement: Secondary | ICD-10-CM

## 2021-08-08 DIAGNOSIS — M797 Fibromyalgia: Secondary | ICD-10-CM

## 2021-08-08 DIAGNOSIS — M47816 Spondylosis without myelopathy or radiculopathy, lumbar region: Secondary | ICD-10-CM

## 2021-08-15 ENCOUNTER — Other Ambulatory Visit: Payer: Self-pay | Admitting: Internal Medicine

## 2021-08-15 DIAGNOSIS — J309 Allergic rhinitis, unspecified: Secondary | ICD-10-CM

## 2021-08-15 DIAGNOSIS — H1013 Acute atopic conjunctivitis, bilateral: Secondary | ICD-10-CM

## 2021-08-18 ENCOUNTER — Other Ambulatory Visit: Payer: Self-pay | Admitting: Internal Medicine

## 2021-08-18 DIAGNOSIS — E7800 Pure hypercholesterolemia, unspecified: Secondary | ICD-10-CM

## 2021-08-30 DIAGNOSIS — D045 Carcinoma in situ of skin of trunk: Secondary | ICD-10-CM | POA: Diagnosis not present

## 2021-08-30 DIAGNOSIS — L738 Other specified follicular disorders: Secondary | ICD-10-CM | POA: Diagnosis not present

## 2021-08-30 DIAGNOSIS — D485 Neoplasm of uncertain behavior of skin: Secondary | ICD-10-CM | POA: Diagnosis not present

## 2021-08-30 DIAGNOSIS — Z85828 Personal history of other malignant neoplasm of skin: Secondary | ICD-10-CM | POA: Diagnosis not present

## 2021-08-30 DIAGNOSIS — L821 Other seborrheic keratosis: Secondary | ICD-10-CM | POA: Diagnosis not present

## 2021-09-05 DIAGNOSIS — M160 Bilateral primary osteoarthritis of hip: Secondary | ICD-10-CM | POA: Diagnosis not present

## 2021-09-08 ENCOUNTER — Other Ambulatory Visit: Payer: Self-pay | Admitting: Nurse Practitioner

## 2021-09-08 DIAGNOSIS — M47816 Spondylosis without myelopathy or radiculopathy, lumbar region: Secondary | ICD-10-CM

## 2021-09-08 DIAGNOSIS — M797 Fibromyalgia: Secondary | ICD-10-CM

## 2021-09-08 DIAGNOSIS — M0579 Rheumatoid arthritis with rheumatoid factor of multiple sites without organ or systems involvement: Secondary | ICD-10-CM

## 2021-10-09 ENCOUNTER — Other Ambulatory Visit: Payer: Self-pay | Admitting: Orthopedic Surgery

## 2021-10-10 ENCOUNTER — Other Ambulatory Visit: Payer: Self-pay | Admitting: Orthopedic Surgery

## 2021-10-10 DIAGNOSIS — M47816 Spondylosis without myelopathy or radiculopathy, lumbar region: Secondary | ICD-10-CM

## 2021-10-10 DIAGNOSIS — M0579 Rheumatoid arthritis with rheumatoid factor of multiple sites without organ or systems involvement: Secondary | ICD-10-CM

## 2021-10-10 DIAGNOSIS — M797 Fibromyalgia: Secondary | ICD-10-CM

## 2021-10-10 NOTE — Telephone Encounter (Signed)
Last refilled on 09/09/21  Treatment agreement outdated, request for appt sent to administrative staff to contact patient.

## 2021-10-10 NOTE — Telephone Encounter (Signed)
Patient has request refill on medication "Prednisone". Patient last refill was 09/09/2021 with 30 tablets to be taken once daily with breakfast. I'm unsure if this medication is long term for patient. Medication has Allergy Contraindications. Medication pend and sent to [place], Man, NP. Please Advise.

## 2021-10-13 ENCOUNTER — Other Ambulatory Visit: Payer: Self-pay

## 2021-10-13 ENCOUNTER — Encounter: Payer: Self-pay | Admitting: Nurse Practitioner

## 2021-10-13 ENCOUNTER — Non-Acute Institutional Stay: Payer: Medicare PPO | Admitting: Nurse Practitioner

## 2021-10-13 VITALS — BP 110/70 | HR 107 | Temp 97.1°F | Ht 64.0 in | Wt 185.0 lb

## 2021-10-13 DIAGNOSIS — R059 Cough, unspecified: Secondary | ICD-10-CM | POA: Diagnosis not present

## 2021-10-13 DIAGNOSIS — R635 Abnormal weight gain: Secondary | ICD-10-CM

## 2021-10-13 DIAGNOSIS — E538 Deficiency of other specified B group vitamins: Secondary | ICD-10-CM | POA: Diagnosis not present

## 2021-10-13 DIAGNOSIS — M810 Age-related osteoporosis without current pathological fracture: Secondary | ICD-10-CM | POA: Diagnosis not present

## 2021-10-13 DIAGNOSIS — M0579 Rheumatoid arthritis with rheumatoid factor of multiple sites without organ or systems involvement: Secondary | ICD-10-CM

## 2021-10-13 DIAGNOSIS — N949 Unspecified condition associated with female genital organs and menstrual cycle: Secondary | ICD-10-CM

## 2021-10-13 DIAGNOSIS — K219 Gastro-esophageal reflux disease without esophagitis: Secondary | ICD-10-CM | POA: Diagnosis not present

## 2021-10-13 DIAGNOSIS — E782 Mixed hyperlipidemia: Secondary | ICD-10-CM

## 2021-10-13 DIAGNOSIS — I872 Venous insufficiency (chronic) (peripheral): Secondary | ICD-10-CM | POA: Diagnosis not present

## 2021-10-13 DIAGNOSIS — M797 Fibromyalgia: Secondary | ICD-10-CM | POA: Diagnosis not present

## 2021-10-13 DIAGNOSIS — E785 Hyperlipidemia, unspecified: Secondary | ICD-10-CM

## 2021-10-13 DIAGNOSIS — K5720 Diverticulitis of large intestine with perforation and abscess without bleeding: Secondary | ICD-10-CM

## 2021-10-13 DIAGNOSIS — M8580 Other specified disorders of bone density and structure, unspecified site: Secondary | ICD-10-CM

## 2021-10-13 DIAGNOSIS — J45909 Unspecified asthma, uncomplicated: Secondary | ICD-10-CM | POA: Diagnosis not present

## 2021-10-13 HISTORY — DX: Hyperlipidemia, unspecified: E78.5

## 2021-10-13 NOTE — Assessment & Plan Note (Signed)
RA/fibromyalgia,  takes Prednisone, Tramadol, TSH 2.04 07/27/20, update CRP, ESR

## 2021-10-13 NOTE — Assessment & Plan Note (Signed)
takes Furosemide 47m qd, Kcl. Bun/creat 29/1.14 06/20/21, update CMP/eGFR

## 2021-10-13 NOTE — Assessment & Plan Note (Signed)
takes Alendronate, Vit D, t score -1.8 06/06/21, update Vit D level.

## 2021-10-13 NOTE — Assessment & Plan Note (Signed)
,   takes Atorvastatin, update lipid panel.

## 2021-10-13 NOTE — Patient Instructions (Addendum)
1.) Walk in at [place] to get Chest X-ray M-F (8-4:30 pm)  2.) Fasting labs next Tuesday, 10/18/2021  3.) 2 week follow-up on 10/27/2021 with [facility]

## 2021-10-13 NOTE — Assessment & Plan Note (Signed)
RA/fibromyalgia,  takes Prednisone, Tramadol, TSH 2.04 07/27/20, update TSH, ESR, CRP

## 2021-10-13 NOTE — Assessment & Plan Note (Signed)
Stable, Prevacid 30mg  qd, Hgb 13.5 02/03/21, update CBC/diff

## 2021-10-13 NOTE — Assessment & Plan Note (Signed)
CT left adnexa cyst 11/2020, MRI 05/08/21,  f/u US 6-12 months.

## 2021-10-13 NOTE — Assessment & Plan Note (Addendum)
Takes Epinephrine prn, Allegra, Flonase, Singulair, c/o DOE, on and off wheezing, cough, occasional yellow phlegm in am, x1 chest pain under the right breast today, resolved w/o intervention, unable to reproduce pain with deep breathing or body movement during my examination. No O2 desaturation, HR is 100s, not new, denied palpitation, weight gained about #5Ibs in the past 4 months but no apparent fluid retention.  She is afebrile. Will obtain CXR, update CBC/diff,  observe for s/s of new cardiopulmonary symptoms.  10/14/21 No acute abnormality of the lungs.

## 2021-10-13 NOTE — Assessment & Plan Note (Signed)
takes Alendronate, Vit D, t score -1.8 06/06/21

## 2021-10-13 NOTE — Assessment & Plan Note (Signed)
Hx of aute sigmoid diverticulitis with pneumoperitoneum, treated with  Omnicef/Falgyl 06/2020, f/u Dr. [name]

## 2021-10-13 NOTE — Assessment & Plan Note (Signed)
try Vit B12 po, Vit B12 235 06/20/21, update Vit B12 level.

## 2021-10-13 NOTE — Progress Notes (Addendum)
Location:   clinic [facility]   Place of Service:  Clinic (12)clinic [facility] Provider: Marlana Latus NP  Code Status: DNR Goals of Care: [place] 06/23/2021  Does Patient Have a Medical Advance Directive? Yes  Type of Advance Directive [place]  Does patient want to make changes to medical advance directive? No - Patient declined  Copy of [place] in Chart? Yes - validated most recent copy scanned in chart (See row information)  Would patient like information on creating a medical advance directive? -     Chief Complaint  Patient presents with   Medical Management of Chronic Issues    4 month follow-up. SOB and wheezing off/on. Discuss lasix, patient was told to take extra if needed and needs a new rx with a different dispense number     HPI: Patient is a 87 y.o. female seen today for c/o DOE, on and off wheezing, cough, occasional yellow phlegm in am, x1 chest pain under the right breast today, resolved w/o intervention, unable to reproduce with deep breathing or body movement during my examination. No O2 desaturation, HR is 100s, not new, denied palpitation. She is afebrile.    Hx of aute sigmoid diverticulitis with pneumoperitoneum, treated with  Omnicef/Falgyl 06/2020, f/u Dr. [name]             Asthma Epinephrine prn, Allegra, Flonase, Singulair             RA/fibromyalgia,  takes Prednisone, Tramadol, TSH 2.04 07/27/20             GERD Prevacid 22m qd, Hgb 13.5 02/03/21             Venous insufficiency/Edema , takes Furosemide 866mqd, Kcl. Bun/creat 29/1.14 06/20/21             OP Rheumatology, takes Alendronate, Vit D, t score -1.8 06/06/21             Hyperlipidemia, takes Atorvastatin             Vit B12 deficiency, try Vit B12 po, Vit B12 235 06/20/21             CT left adnexa cyst 11/2020, MRI 05/08/21,  [place]     Past Medical History:  Diagnosis Date   Adrenal failure ([facility]   Arthritis    Asthma    Cancer  ([place]   Cataract    Closed nondisplaced fracture of fifth right metatarsal bone 09/18/2017   Diverticulitis    Per patient   Fibromyalgia 2008   HCAP (healthcare-associated pneumonia) 02/03/2018   Osteoporosis    RA (rheumatoid arthritis) ([facility]   Recurrent upper respiratory infection (URI)    Sepsis due to urinary tract infection ([place]/07/2018   Urticaria     Past Surgical History:  Procedure Laterality Date   BACK SURGERY     BILATERAL CARPAL TUNNEL RELEASE  2005   right and left   CATARACT EXTRACTION, BILATERAL  2004   right and left   CERVICAL FUSION  2011,2010,2008   2 disks   HEEL SPUR SURGERY  2004   lower back fusion  2011   Fusion of 3-4 and 4-5 lower back   RADIOFREQUENCY ABLATION  2020   ROTATOR CUFF REPAIR  [id number] SQUAMOUS CELL CARCINOMA EXCISION     TONSILLECTOMY AND ADENOIDECTOMY  1947   TOTAL SHOULDER ARTHROPLASTY      Allergies  Allergen Reactions  Adhesive [Tape] Rash   Celebrex [Celecoxib] Hives   Ciprofibrate Nausea Only   Cymbalta [Duloxetine Hcl] Swelling   Gabitril [Tiagabine] Swelling   Keflex [Cephalexin] Nausea And Vomiting   Lyrica [Pregabalin] Swelling   Neurontin [Gabapentin] Swelling   Nexium [Esomeprazole] Rash   Nsaids Rash   Penicillins Rash    Injection site reaction. Tolerated cefepime in past Has patient had a PCN reaction causing immediate rash, facial/tongue/throat swelling, SOB or lightheadedness with hypotension: No Has patient had a PCN reaction causing severe rash involving mucus membranes or skin necrosis: No Has patient had a PCN reaction that required hospitalization No Has patient had a PCN reaction occurring within the last 10 years: No If all of the above answers are "NO", then may proceed with Cephalosporin use.    Shrimp [Shellfish Allergy] Anaphylaxis    Per patient "shrimp only"   Sulfa Antibiotics Nausea And Vomiting   Azactam [Aztreonam]     Hand swelling    Azelastine Hcl     Rash    Ciprofloxacin  Other (See Comments)    dizziness   Claritin [Loratadine]     Irritability Nervousness    Methotrexate Derivatives    Nasacort [Triamcinolone]     Dizzy    Olopatadine Other (See Comments)    Pain and lethargy    Other    Sulfamethizole Other (See Comments)    unknown   Zantac [Ranitidine Hcl] Other (See Comments)    unknown   Claritin-D 12 Hour [Loratadine-Pseudoephedrine Er] Anxiety    Allergies as of 10/13/2021       Reactions   Adhesive [tape] Rash   Celebrex [celecoxib] Hives   Ciprofibrate Nausea Only   Cymbalta [duloxetine Hcl] Swelling   Gabitril [tiagabine] Swelling   Keflex [cephalexin] Nausea And Vomiting   Lyrica [pregabalin] Swelling   Neurontin [gabapentin] Swelling   Nexium [esomeprazole] Rash   Nsaids Rash   Penicillins Rash   Injection site reaction. Tolerated cefepime in past Has patient had a PCN reaction causing immediate rash, facial/tongue/throat swelling, SOB or lightheadedness with hypotension: No Has patient had a PCN reaction causing severe rash involving mucus membranes or skin necrosis: No Has patient had a PCN reaction that required hospitalization No Has patient had a PCN reaction occurring within the last 10 years: No If all of the above answers are "NO", then may proceed with Cephalosporin use.   Shrimp [shellfish Allergy] Anaphylaxis   Per patient "shrimp only"   Sulfa Antibiotics Nausea And Vomiting   Azactam [aztreonam]    Hand swelling    Azelastine Hcl    Rash    Ciprofloxacin Other (See Comments)   dizziness   Claritin [loratadine]    Irritability Nervousness    Methotrexate Derivatives    Nasacort [triamcinolone]    Dizzy    Olopatadine Other (See Comments)   Pain and lethargy    Other    Sulfamethizole Other (See Comments)   unknown   Zantac [ranitidine Hcl] Other (See Comments)   unknown   Claritin-d 12 Hour [loratadine-pseudoephedrine Er] Anxiety        Medication List        Accurate as of October 13, 2021  11:59 PM. If you have any questions, ask your nurse or doctor.          STOP taking these medications    cefpodoxime 100 MG tablet Commonly known as: VANTIN Stopped by: Antoinette Haskett X Sarim Rothman, NP   doxycycline 100 MG tablet Commonly known as: VIBRA-TABS Stopped  by: Legna Mausolf X Jazzma Neidhardt, NP       TAKE these medications    acetaminophen 500 MG tablet Commonly known as: TYLENOL Take 500 mg by mouth every 6 (six) hours.   alendronate 70 MG tablet Commonly known as: Fosamax Take 1 tablet (70 mg total) by mouth every 7 (seven) days. Take with a full glass of water on an empty stomach.   atorvastatin 20 MG tablet Commonly known as: LIPITOR TAKE 1 TABLET EVERY DAY   Biotin 10 MG Tabs Take 10 mg by mouth daily.   CRANBERRY PO Take 1 capsule by mouth daily.   cycloSPORINE 0.05 % ophthalmic emulsion Commonly known as: RESTASIS Place 1 drop into both eyes 2 (two) times daily.   diclofenac Sodium 1 % Gel Commonly known as: VOLTAREN APPLY 2 TO 4 GRAMS TOPICALLY TO AFFECTED JOINTS UP TO 4 TIMES DAILY   EPINEPHrine 0.3 mg/0.3 mL Soaj injection Commonly known as: EPI-PEN Inject 0.3 mg into the muscle as needed for anaphylaxis.   fexofenadine 180 MG tablet Commonly known as: ALLEGRA Take 180 mg by mouth daily.   fluticasone 50 MCG/ACT nasal spray Commonly known as: FLONASE SPRAY 2 SPRAYS INTO EACH NOSTRIL EVERY DAY   furosemide 80 MG tablet Commonly known as: LASIX Take 1 tablet (80 mg total) by mouth daily.   ketoconazole 2 % shampoo Commonly known as: NIZORAL Apply 1 application topically once a week.   lansoprazole 30 MG capsule Commonly known as: PREVACID TAKE 1 CAPSULE BY MOUTH EVERY DAY AT 12 NOON   MELATONIN PO Take 10 mg by mouth daily.   montelukast 10 MG tablet Commonly known as: SINGULAIR TAKE 1 TABLET BY MOUTH AT BEDTIME   potassium chloride 10 MEQ tablet Commonly known as: KLOR-CON Take 2 tablets by mouth twice daily   predniSONE 10 MG tablet Commonly known as:  DELTASONE Take 1 tablet by mouth once daily with breakfast   SYRINGE 3CC/25GX1" 25G X 1" 3 ML Misc 1 application by Does not apply route every 30 (thirty) days.   traMADol 50 MG tablet Commonly known as: ULTRAM Take 1 tablet by mouth 4 times daily   vitamin B-12 1000 MCG tablet Commonly known as: CYANOCOBALAMIN Take 1 tablet (1,000 mcg total) by mouth daily.   Vitamin D3 125 MCG (5000 UT) Caps Take 5,000 Units by mouth daily.        Review of Systems:  Review of Systems  Constitutional:  Positive for unexpected weight change. Negative for fatigue and fever.       Weight gained about #5Ibs in the past 4 months.   HENT:  Positive for congestion and postnasal drip. Negative for sinus pressure, sinus pain, sore throat and voice change.   Eyes:  Negative for visual disturbance.  Respiratory:  Positive for cough, shortness of breath and wheezing. Negative for chest tightness.        DOE, occasional yellow phlegm in am, hacking cough.   Cardiovascular:  Negative for leg swelling.  Gastrointestinal:  Negative for abdominal pain and constipation.  Genitourinary:  Negative for dysuria and urgency.       Incontinent of urine.   Musculoskeletal:  Positive for arthralgias, back pain, gait problem and myalgias.       Right lower back hip pain, travels down to the right le.   Skin:  Negative for color change.  Neurological:  Negative for speech difficulty, weakness and light-headedness.  Psychiatric/Behavioral:  Negative for confusion and sleep disturbance. The patient is not nervous/anxious.  Health Maintenance  Topic Date Due   TETANUS/TDAP  Never done   Pneumonia Vaccine 37+ Years old  Completed   INFLUENZA VACCINE  Completed   DEXA SCAN  Completed   COVID-19 Vaccine  Completed   Zoster Vaccines- Shingrix  Completed   HPV VACCINES  Aged Out    Physical Exam: Vitals:   10/13/21 1529  BP: 110/70  Pulse: (!) 107  Temp: (!) 97.1 F (36.2 C)  TempSrc: Temporal  SpO2: 97%   Weight: 185 lb (83.9 kg)  Height: '5\' 4"'  (1.626 m)   Body mass index is 31.76 kg/m. Physical Exam Vitals and nursing note reviewed.  Constitutional:      Appearance: Normal appearance.  HENT:     Head: Normocephalic and atraumatic.     Nose: Rhinorrhea present.     Mouth/Throat:     Mouth: Mucous membranes are moist.     Pharynx: No oropharyngeal exudate or posterior oropharyngeal erythema.  Eyes:     Extraocular Movements: Extraocular movements intact.     Conjunctiva/sclera: Conjunctivae normal.     Pupils: Pupils are equal, round, and reactive to light.  Neck:     Comments: C/o left upper back/neck pain, better with warm compress, will work with therapy Cardiovascular:     Rate and Rhythm: Normal rate and regular rhythm.     Heart sounds: No murmur heard.    Comments: HR was 98 bpm upon my examination.  Pulmonary:     Effort: Pulmonary effort is normal.     Breath sounds: No wheezing, rhonchi or rales.  Chest:     Chest wall: No tenderness.  Abdominal:     General: Bowel sounds are normal.     Palpations: Abdomen is soft.     Tenderness: There is no abdominal tenderness.  Musculoskeletal:     Cervical back: Normal range of motion and neck supple.     Right lower leg: No edema.     Left lower leg: No edema.  Skin:    General: Skin is warm and dry.  Neurological:     General: No focal deficit present.     Mental Status: She is alert and oriented to person, place, and time. Mental status is at baseline.     Gait: Gait abnormal.  Psychiatric:        Mood and Affect: Mood normal.        Behavior: Behavior normal.        Thought Content: Thought content normal.        Judgment: Judgment normal.    Labs reviewed: Basic Metabolic Panel: Recent Labs    02/03/21 1422 06/20/21 0920  NA 141 145  K 4.3 4.0  CL 100 103  CO2 33* 29  GLUCOSE 103* 75  BUN 23 29*  CREATININE 1.05* 1.14*  CALCIUM 9.8 10.2   Liver Function Tests: Recent Labs    02/03/21 1422  06/20/21 0920  AST 14 16  ALT 11 15  BILITOT 0.6 0.5  PROT 7.0 7.4   No results for input(s): LIPASE, AMYLASE in the last 8760 hours. No results for input(s): AMMONIA in the last 8760 hours. CBC: Recent Labs    02/03/21 1422  WBC 15.0*  NEUTROABS 12,645*  HGB 13.5  HCT 41.0  MCV 93.8  PLT 272   Lipid Panel: No results for input(s): CHOL, HDL, LDLCALC, TRIG, CHOLHDL, LDLDIRECT in the last 8760 hours. No results found for: HGBA1C  Procedures since last visit: DG Chest 2 View  Result Date: 10/14/2021 CLINICAL DATA:  Cough, wheezing for 2 weeks EXAM: CHEST - 2 VIEW COMPARISON:  06/23/2020 FINDINGS: The heart size and mediastinal contours are within normal limits. Both lungs are clear. Disc degenerative disease of the thoracic spine. IMPRESSION: No acute abnormality of the lungs. Electronically Signed   By: Delanna Ahmadi M.D.   On: 10/14/2021 16:46    Assessment/Plan  Fibromyalgia RA/fibromyalgia,  takes Prednisone, Tramadol, TSH 2.04 07/27/20, update TSH, ESR, CRP  GERD (gastroesophageal reflux disease) Stable, Prevacid 89m qd, Hgb 13.5 02/03/21, update CBC/diff  Edema of both lower extremities due to peripheral venous insufficiency  takes Furosemide 835mqd, Kcl. Bun/creat 29/1.14 06/20/21, update CMP/eGFR  Age-related osteoporosis without current pathological fracture takes Alendronate, Vit D, t score -1.8 06/06/21, update Vit D level.   Hyperlipidemia , takes Atorvastatin, update lipid panel.   B12 deficiency  try Vit B12 po, Vit B12 235 06/20/21, update Vit B12 level.   Adnexal cyst CT left adnexa cyst 11/2020, MRI 05/08/21,  [place].  Asthma Takes Epinephrine prn, Allegra, Flonase, Singulair, c/o DOE, on and off wheezing, cough, occasional yellow phlegm in am, x1 chest pain under the right breast today, resolved w/o intervention, unable to reproduce pain with deep breathing or body movement during my examination. No O2 desaturation, HR is 100s, not new,  denied palpitation, weight gained about #5Ibs in the past 4 months but no apparent fluid retention.  She is afebrile. Will obtain CXR, update CBC/diff,  observe for s/s of new cardiopulmonary symptoms.  10/14/21 No acute abnormality of the lungs.    Perforation of sigmoid colon due to diverticulitis Hx of aute sigmoid diverticulitis with pneumoperitoneum, treated with  Omnicef/Falgyl 06/2020, f/u Dr. KiKieth BrightlyRheumatoid arthritis ([facility]/fibromyalgia,  takes Prednisone, Tramadol, TSH 2.04 07/27/20, update CRP, ESR  Osteopenia takes Alendronate, Vit D, t score -1.8 06/06/21  Weight gain Weight gained about #5Ibs in the past 56m97mon setting of c/o cough/DOE, HR 100s, will obtain CBC/diff, CMP/eGFR, TSH, ESR, CRP, lipid panel, Vit B12 ,Vit D. CXR ap/lateral, BNP   Labs/tests ordered:  CBC/diff, CMP/eGFR, TSH, ESR, CRP, lipid panel, Vit B12 ,Vit D. CXR ap/lateral, BNP  Next appt:  1 week

## 2021-10-14 ENCOUNTER — Other Ambulatory Visit: Payer: Self-pay

## 2021-10-14 ENCOUNTER — Ambulatory Visit
Admission: RE | Admit: 2021-10-14 | Discharge: 2021-10-14 | Disposition: A | Discharge status: Home / Self Care | Payer: Medicare PPO | Source: Ambulatory Visit | Attending: Nurse Practitioner | Admitting: Nurse Practitioner

## 2021-10-14 ENCOUNTER — Encounter: Payer: Self-pay | Admitting: Nurse Practitioner

## 2021-10-14 DIAGNOSIS — R059 Cough, unspecified: Secondary | ICD-10-CM | POA: Diagnosis not present

## 2021-10-14 DIAGNOSIS — R635 Abnormal weight gain: Secondary | ICD-10-CM | POA: Insufficient documentation

## 2021-10-14 NOTE — Assessment & Plan Note (Signed)
Weight gained about #5Ibs in the past 26mo in setting of c/o cough/DOE, HR 100s, will obtain CBC/diff, CMP/eGFR, TSH, ESR, CRP, lipid panel, Vit B12 ,Vit D. CXR ap/lateral, BNP

## 2021-10-17 ENCOUNTER — Other Ambulatory Visit: Payer: Self-pay | Admitting: Nurse Practitioner

## 2021-10-17 DIAGNOSIS — I872 Venous insufficiency (chronic) (peripheral): Secondary | ICD-10-CM | POA: Diagnosis not present

## 2021-10-17 DIAGNOSIS — R600 Localized edema: Secondary | ICD-10-CM

## 2021-10-17 NOTE — Telephone Encounter (Signed)
High risk or very high risk warning populated when attempting to refill medication. RX request sent to PCP for review and approval if warranted.   

## 2021-10-18 ENCOUNTER — Other Ambulatory Visit: Payer: Medicare PPO

## 2021-10-18 ENCOUNTER — Other Ambulatory Visit: Payer: Self-pay

## 2021-10-18 DIAGNOSIS — I872 Venous insufficiency (chronic) (peripheral): Secondary | ICD-10-CM

## 2021-10-18 LAB — BRAIN NATRIURETIC PEPTIDE: Brain Natriuretic Peptide: 46 pg/mL (ref <100)

## 2021-10-19 ENCOUNTER — Other Ambulatory Visit: Payer: Self-pay | Admitting: Nurse Practitioner

## 2021-10-19 DIAGNOSIS — R600 Localized edema: Secondary | ICD-10-CM

## 2021-10-20 DIAGNOSIS — M1611 Unilateral primary osteoarthritis, right hip: Secondary | ICD-10-CM | POA: Diagnosis not present

## 2021-10-20 DIAGNOSIS — M5136 Other intervertebral disc degeneration, lumbar region: Secondary | ICD-10-CM | POA: Diagnosis not present

## 2021-10-20 DIAGNOSIS — M25552 Pain in left hip: Secondary | ICD-10-CM | POA: Diagnosis not present

## 2021-10-20 DIAGNOSIS — M48062 Spinal stenosis, lumbar region with neurogenic claudication: Secondary | ICD-10-CM | POA: Diagnosis not present

## 2021-10-27 ENCOUNTER — Encounter: Payer: Self-pay | Admitting: Nurse Practitioner

## 2021-10-27 ENCOUNTER — Non-Acute Institutional Stay: Payer: Medicare PPO | Admitting: Nurse Practitioner

## 2021-10-27 ENCOUNTER — Other Ambulatory Visit: Payer: Self-pay

## 2021-10-27 DIAGNOSIS — E538 Deficiency of other specified B group vitamins: Secondary | ICD-10-CM

## 2021-10-27 DIAGNOSIS — M8580 Other specified disorders of bone density and structure, unspecified site: Secondary | ICD-10-CM

## 2021-10-27 DIAGNOSIS — R000 Tachycardia, unspecified: Secondary | ICD-10-CM | POA: Diagnosis not present

## 2021-10-27 DIAGNOSIS — M0579 Rheumatoid arthritis with rheumatoid factor of multiple sites without organ or systems involvement: Secondary | ICD-10-CM

## 2021-10-27 DIAGNOSIS — K219 Gastro-esophageal reflux disease without esophagitis: Secondary | ICD-10-CM

## 2021-10-27 DIAGNOSIS — E782 Mixed hyperlipidemia: Secondary | ICD-10-CM

## 2021-10-27 DIAGNOSIS — M47816 Spondylosis without myelopathy or radiculopathy, lumbar region: Secondary | ICD-10-CM

## 2021-10-27 DIAGNOSIS — K5720 Diverticulitis of large intestine with perforation and abscess without bleeding: Secondary | ICD-10-CM

## 2021-10-27 DIAGNOSIS — J45909 Unspecified asthma, uncomplicated: Secondary | ICD-10-CM | POA: Diagnosis not present

## 2021-10-27 DIAGNOSIS — N949 Unspecified condition associated with female genital organs and menstrual cycle: Secondary | ICD-10-CM

## 2021-10-27 DIAGNOSIS — I872 Venous insufficiency (chronic) (peripheral): Secondary | ICD-10-CM

## 2021-10-27 DIAGNOSIS — M810 Age-related osteoporosis without current pathological fracture: Secondary | ICD-10-CM

## 2021-10-27 MED ORDER — PANTOPRAZOLE SODIUM 40 MG PO TBEC: 40.0000 mg | DELAYED_RELEASE_TABLET | Freq: Every day | ORAL | 3 refills | Status: DC

## 2021-10-27 NOTE — Assessment & Plan Note (Signed)
06/06/21 DEXA t score -1.8 10 years probability hip fx 9.8%, osteoporotic fx 30.7%, recommend tx per WHO Declined Fosamax.

## 2021-10-27 NOTE — Progress Notes (Signed)
Location:   clinic [place]   Place of Service:   clinic [place] Provider: Marlana Latus NP  Code Status: DNR Goals of Care: [place] 06/23/2021  Does Patient Have a Medical Advance Directive? Yes  Type of Advance Directive [place]  Does patient want to make changes to medical advance directive? No - Patient declined  Copy of [place] in Chart? Yes - validated most recent copy scanned in chart (See row information)  Would patient like information on creating a medical advance directive? -     Chief Complaint  Patient presents with   Medical Management of Chronic Issues    Patient returns to the clinic for her 2 week follow up.      HPI: Patient is a 87 y.o. female seen today for medical management of chronic diseases.      DOE, on and off wheezing, cough, occasional yellow phlegm in am. CXR 10/14/21 no acute abnormality of the lungs. Echo EF 55-60/5 2017. BNP 46 10/17/21  Hx of sinus tachycardia, EKG 06/25/20             Hx of aute sigmoid diverticulitis with pneumoperitoneum, treated with  Omnicef/Falgyl 06/2020, f/u Dr. [name]             Asthma Epinephrine prn, Allegra, Flonase, Singulair             RA/fibromyalgia,  takes Prednisone, Tramadol, TSH 2.04 07/27/20             GERD Prevacid 30mg  qd, Hgb 13.5 02/03/21             Venous insufficiency/Edema , takes Furosemide 80mg  qd, Kcl. Bun/creat 29/1.14 06/20/21. BNP 46 10/17/21             OP Rheumatology, takes Alendronate, Vit D, t score -1.8 06/06/21             Hyperlipidemia, takes Atorvastatin             Vit B12 deficiency, on Vit B12 po, Vit B12 235 06/20/21             CT left adnexa cyst 11/2020, MRI 05/08/21,  f/u US 6-12 months.   Past Medical History:  Diagnosis Date   Adrenal failure ([place])    Arthritis    Asthma    Cancer ([place])    Cataract    Closed nondisplaced fracture of fifth right metatarsal bone 09/18/2017   Diverticulitis    Per patient   Fibromyalgia 2008    HCAP (healthcare-associated pneumonia) 02/03/2018   Osteoporosis    RA (rheumatoid arthritis) ([place])    Recurrent upper respiratory infection (URI)    Sepsis due to urinary tract infection ([place]) 01/18/2018   Urticaria     Past Surgical History:  Procedure Laterality Date   BACK SURGERY     BILATERAL CARPAL TUNNEL RELEASE  2005   right and left   CATARACT EXTRACTION, BILATERAL  2004   right and left   CERVICAL FUSION  2011,2010,2008   2 disks   HEEL SPUR SURGERY  2004   lower back fusion  2011   Fusion of 3-4 and 4-5 lower back   RADIOFREQUENCY ABLATION  2020   ROTATOR CUFF REPAIR  [id number]   SQUAMOUS CELL CARCINOMA EXCISION     TONSILLECTOMY AND ADENOIDECTOMY  1947   TOTAL SHOULDER ARTHROPLASTY      Allergies  Allergen Reactions   Adhesive [Tape] Rash   Celebrex [Celecoxib] Hives  Ciprofibrate Nausea Only   Cymbalta [Duloxetine Hcl] Swelling   Gabitril [Tiagabine] Swelling   Keflex [Cephalexin] Nausea And Vomiting   Lyrica [Pregabalin] Swelling   Neurontin [Gabapentin] Swelling   Nexium [Esomeprazole] Rash   Nsaids Rash   Penicillins Rash    Injection site reaction. Tolerated cefepime in past Has patient had a PCN reaction causing immediate rash, facial/tongue/throat swelling, SOB or lightheadedness with hypotension: No Has patient had a PCN reaction causing severe rash involving mucus membranes or skin necrosis: No Has patient had a PCN reaction that required hospitalization No Has patient had a PCN reaction occurring within the last 10 years: No If all of the above answers are "NO", then may proceed with Cephalosporin use.    Shrimp [Shellfish Allergy] Anaphylaxis    Per patient "shrimp only"   Sulfa Antibiotics Nausea And Vomiting   Azactam [Aztreonam]     Hand swelling    Azelastine Hcl     Rash    Ciprofloxacin Other (See Comments)    dizziness   Claritin [Loratadine]     Irritability Nervousness    Methotrexate Derivatives    Nasacort [Triamcinolone]      Dizzy    Olopatadine Other (See Comments)    Pain and lethargy    Other    Sulfamethizole Other (See Comments)    unknown   Zantac [Ranitidine Hcl] Other (See Comments)    unknown   Claritin-D 12 Hour [Loratadine-Pseudoephedrine Er] Anxiety    Allergies as of 10/27/2021       Reactions   Adhesive [tape] Rash   Celebrex [celecoxib] Hives   Ciprofibrate Nausea Only   Cymbalta [duloxetine Hcl] Swelling   Gabitril [tiagabine] Swelling   Keflex [cephalexin] Nausea And Vomiting   Lyrica [pregabalin] Swelling   Neurontin [gabapentin] Swelling   Nexium [esomeprazole] Rash   Nsaids Rash   Penicillins Rash   Injection site reaction. Tolerated cefepime in past Has patient had a PCN reaction causing immediate rash, facial/tongue/throat swelling, SOB or lightheadedness with hypotension: No Has patient had a PCN reaction causing severe rash involving mucus membranes or skin necrosis: No Has patient had a PCN reaction that required hospitalization No Has patient had a PCN reaction occurring within the last 10 years: No If all of the above answers are "NO", then may proceed with Cephalosporin use.   Shrimp [shellfish Allergy] Anaphylaxis   Per patient "shrimp only"   Sulfa Antibiotics Nausea And Vomiting   Azactam [aztreonam]    Hand swelling    Azelastine Hcl    Rash    Ciprofloxacin Other (See Comments)   dizziness   Claritin [loratadine]    Irritability Nervousness    Methotrexate Derivatives    Nasacort [triamcinolone]    Dizzy    Olopatadine Other (See Comments)   Pain and lethargy    Other    Sulfamethizole Other (See Comments)   unknown   Zantac [ranitidine Hcl] Other (See Comments)   unknown   Claritin-d 12 Hour [loratadine-pseudoephedrine Er] Anxiety        Medication List        Accurate as of October 27, 2021 11:59 PM. If you have any questions, ask your nurse or doctor.          STOP taking these medications    alendronate 70 MG tablet Commonly  known as: Fosamax Stopped by: Tove Wideman X Janea Schwenn, NP       TAKE these medications    acetaminophen 500 MG tablet Commonly known as: TYLENOL Take  500 mg by mouth every 6 (six) hours.   atorvastatin 20 MG tablet Commonly known as: LIPITOR TAKE 1 TABLET EVERY DAY   Biotin 10 MG Tabs Take 10 mg by mouth daily.   CRANBERRY PO Take 1 capsule by mouth daily.   cycloSPORINE 0.05 % ophthalmic emulsion Commonly known as: RESTASIS Place 1 drop into both eyes 2 (two) times daily.   diclofenac Sodium 1 % Gel Commonly known as: VOLTAREN APPLY 2 TO 4 GRAMS TOPICALLY TO AFFECTED JOINTS UP TO 4 TIMES DAILY   EPINEPHrine 0.3 mg/0.3 mL Soaj injection Commonly known as: EPI-PEN Inject 0.3 mg into the muscle as needed for anaphylaxis.   fexofenadine 180 MG tablet Commonly known as: ALLEGRA Take 180 mg by mouth daily.   fluticasone 50 MCG/ACT nasal spray Commonly known as: FLONASE SPRAY 2 SPRAYS INTO EACH NOSTRIL EVERY DAY   furosemide 80 MG tablet Commonly known as: LASIX Take 1 tablet by mouth once daily   ketoconazole 2 % shampoo Commonly known as: NIZORAL Apply 1 application topically once a week.   lansoprazole 30 MG capsule Commonly known as: PREVACID TAKE 1 CAPSULE BY MOUTH EVERY DAY AT 12 NOON   MELATONIN PO Take 10 mg by mouth daily.   montelukast 10 MG tablet Commonly known as: SINGULAIR TAKE 1 TABLET BY MOUTH AT BEDTIME   pantoprazole 40 MG tablet Commonly known as: PROTONIX Take 1 tablet (40 mg total) by mouth daily. Started by: Beva Remund X Joran Kallal, NP   potassium chloride 10 MEQ tablet Commonly known as: KLOR-CON Take 2 tablets by mouth twice daily   predniSONE 10 MG tablet Commonly known as: DELTASONE Take 1 tablet by mouth once daily with breakfast   SYRINGE 3CC/25GX1" 25G X 1" 3 ML Misc 1 application by Does not apply route every 30 (thirty) days.   traMADol 50 MG tablet Commonly known as: ULTRAM Take 1 tablet by mouth 4 times daily   vitamin B-12 1000 MCG  tablet Commonly known as: CYANOCOBALAMIN Take 1 tablet (1,000 mcg total) by mouth daily.   Vitamin D3 125 MCG (5000 UT) Caps Take 5,000 Units by mouth daily.        Review of Systems:  Review of Systems  Constitutional:  Negative for appetite change, fatigue and fever.       Weight gained about #5Ibs in the past 4 months.   HENT:  Positive for congestion and postnasal drip. Negative for sinus pressure, sinus pain, sore throat and voice change.   Eyes:  Negative for visual disturbance.  Respiratory:  Positive for cough, shortness of breath and wheezing. Negative for chest tightness.        DOE, occasional yellow phlegm in am, hacking cough.   Cardiovascular:  Negative for leg swelling.  Gastrointestinal:  Negative for abdominal pain and constipation.       Indegestion  Genitourinary:  Negative for dysuria and urgency.       Incontinent of urine.   Musculoskeletal:  Positive for arthralgias, back pain, gait problem and myalgias.       Right lower back hip pain, travels down to the right leg. Sometimes 2nd or 3rd costochondritis pain-not today  Skin:  Negative for color change.  Neurological:  Negative for speech difficulty, weakness and light-headedness.  Psychiatric/Behavioral:  Negative for confusion and sleep disturbance. The patient is not nervous/anxious.    Health Maintenance  Topic Date Due   TETANUS/TDAP  Never done   Pneumonia Vaccine 76+ Years old  Completed   INFLUENZA  VACCINE  Completed   DEXA SCAN  Completed   COVID-19 Vaccine  Completed   Zoster Vaccines- Shingrix  Completed   HPV VACCINES  Aged Out    Physical Exam: Vitals:   10/27/21 1422  BP: (!) 136/98  Pulse: (!) 104  Temp: (!) 96.4 F (35.8 C)  SpO2: 98%  Weight: 180 lb 12.8 oz (82 kg)  Height: 5\' 4"  (1.626 m)   Body mass index is 31.03 kg/m. Physical Exam Vitals and nursing note reviewed.  Constitutional:      Appearance: Normal appearance.  HENT:     Head: Normocephalic and atraumatic.      Nose: Rhinorrhea present.     Mouth/Throat:     Mouth: Mucous membranes are moist.     Pharynx: No oropharyngeal exudate or posterior oropharyngeal erythema.  Eyes:     Extraocular Movements: Extraocular movements intact.     Conjunctiva/sclera: Conjunctivae normal.     Pupils: Pupils are equal, round, and reactive to light.  Neck:     Comments: C/o left upper back/neck pain, better with warm compress, will work with therapy Cardiovascular:     Rate and Rhythm: Normal rate and regular rhythm.     Heart sounds: No murmur heard.    Comments: HR was 98 bpm upon my examination.  Pulmonary:     Effort: Pulmonary effort is normal.     Breath sounds: No wheezing, rhonchi or rales.  Chest:     Chest wall: No tenderness.  Abdominal:     General: Bowel sounds are normal.     Palpations: Abdomen is soft.     Tenderness: There is no abdominal tenderness.  Musculoskeletal:     Cervical back: Normal range of motion and neck supple.     Right lower leg: No edema.     Left lower leg: No edema.  Skin:    General: Skin is warm and dry.  Neurological:     General: No focal deficit present.     Mental Status: She is alert and oriented to person, place, and time. Mental status is at baseline.     Gait: Gait abnormal.  Psychiatric:        Mood and Affect: Mood normal.        Behavior: Behavior normal.        Thought Content: Thought content normal.        Judgment: Judgment normal.    Labs reviewed: Basic Metabolic Panel: Recent Labs    02/03/21 1422 06/20/21 0920  NA 141 145  K 4.3 4.0  CL 100 103  CO2 33* 29  GLUCOSE 103* 75  BUN 23 29*  CREATININE 1.05* 1.14*  CALCIUM 9.8 10.2   Liver Function Tests: Recent Labs    02/03/21 1422 06/20/21 0920  AST 14 16  ALT 11 15  BILITOT 0.6 0.5  PROT 7.0 7.4   No results for input(s): LIPASE, AMYLASE in the last 8760 hours. No results for input(s): AMMONIA in the last 8760 hours. CBC: Recent Labs    02/03/21 1422  WBC 15.0*   NEUTROABS 12,645*  HGB 13.5  HCT 41.0  MCV 93.8  PLT 272   Lipid Panel: No results for input(s): CHOL, HDL, LDLCALC, TRIG, CHOLHDL, LDLDIRECT in the last 8760 hours. No results found for: HGBA1C  Procedures since last visit: DG Chest 2 View  Result Date: 10/14/2021 CLINICAL DATA:  Cough, wheezing for 2 weeks EXAM: CHEST - 2 VIEW COMPARISON:  06/23/2020 FINDINGS: The heart size  and mediastinal contours are within normal limits. Both lungs are clear. Disc degenerative disease of the thoracic spine. IMPRESSION: No acute abnormality of the lungs. Electronically Signed   By: Delanna Ahmadi M.D.   On: 10/14/2021 16:46    Assessment/Plan  Asthma Epinephrine prn, Allegra, Flonase, Singulair, on and off wheezing, cough, occasional yellow phlegm in am. CXR 10/14/21 no acute abnormality of the lungs. Echo EF 55-60/5 2017  Sinus tachycardia Hx of sinus tachycardia, EKG 06/25/20  Perforation of sigmoid colon due to diverticulitis Hx of aute sigmoid diverticulitis with pneumoperitoneum, treated with  Omnicef/Falgyl 06/2020, f/u Dr. [name]  Rheumatoid arthritis [facility]) takes Prednisone, Tramadol, TSH 2.04 07/27/20  GERD (gastroesophageal reflux disease) Sometimes she has to take II for indegestion, dc Prevacid 30mg  qd, Hgb 13.5 02/03/21. Will try Pantoprazole 40mg  qd in setting of somtimes am cough/phlegm/wheezing. Unremarkable BNP, CXR.   Edema of both lower extremities due to peripheral venous insufficiency Venous insufficiency/Edema , takes Furosemide 80mg  qd, Kcl. Bun/creat 29/1.14 06/20/21. BNP 46 10/17/21   Age-related osteoporosis without current pathological fracture , takes Alendronate, Vit D, t score -1.8 06/06/21  Hyperlipidemia Takes Atrovastatin.   B12 deficiency on Vit B12 po, Vit B12 235 06/20/21  Adnexal cyst  CT left adnexa cyst 11/2020, MRI 05/08/21,  f/u US 6-12 months.  Osteopenia 06/06/21 DEXA t score -1.8 10 years probability hip fx 9.8%, osteoporotic fx 30.7%,  recommend tx per WHO Declined Fosamax.   Spondylosis of lumbar region without myelopathy or radiculopathy Chronic back, hip, underwent Ortho consultation, referred to therapy-not started yet.   Elevated Sbp, isolated event, asymptomatic, will observe.   Labs/tests ordered:  none   Next appt:  3 months.

## 2021-10-27 NOTE — Assessment & Plan Note (Signed)
Chronic back, hip, underwent Ortho consultation, referred to therapy-not started yet.

## 2021-10-27 NOTE — Assessment & Plan Note (Signed)
takes Prednisone, Tramadol, TSH 2.04 07/27/20

## 2021-10-27 NOTE — Assessment & Plan Note (Signed)
Epinephrine prn, Allegra, Flonase, Singulair, on and off wheezing, cough, occasional yellow phlegm in am. CXR 10/14/21 no acute abnormality of the lungs. Echo EF 55-60/5 2017

## 2021-10-27 NOTE — Assessment & Plan Note (Addendum)
Venous insufficiency/Edema , takes Furosemide 80mg  qd, Kcl. Bun/creat 29/1.14 06/20/21. BNP 46 10/17/21

## 2021-10-27 NOTE — Assessment & Plan Note (Addendum)
Hx of sinus tachycardia, EKG 06/25/20

## 2021-10-27 NOTE — Assessment & Plan Note (Signed)
,   takes Alendronate, Vit D, t score -1.8 06/06/21

## 2021-10-27 NOTE — Assessment & Plan Note (Signed)
Takes Atrovastatin.

## 2021-10-27 NOTE — Assessment & Plan Note (Signed)
on Vit B12 po, Vit B12 235 06/20/21 

## 2021-10-27 NOTE — Assessment & Plan Note (Signed)
CT left adnexa cyst 11/2020, MRI 05/08/21,  f/u US 6-12 months.

## 2021-10-27 NOTE — Assessment & Plan Note (Signed)
Hx of aute sigmoid diverticulitis with pneumoperitoneum, treated with  Omnicef/Falgyl 06/2020, f/u Dr. [name]

## 2021-10-27 NOTE — Assessment & Plan Note (Addendum)
Sometimes she has to take II for indegestion, dc Prevacid 30mg  qd, Hgb 13.5 02/03/21. Will try Pantoprazole 40mg  qd in setting of somtimes am cough/phlegm/wheezing. Unremarkable BNP, CXR.

## 2021-11-11 ENCOUNTER — Other Ambulatory Visit: Payer: Self-pay | Admitting: Nurse Practitioner

## 2021-11-11 NOTE — Telephone Encounter (Signed)
Patient has request refill on medication "Prednisone". Patient last refill on medication was 10/10/2021. Medication has Allergy Contraindications. Medication pend and sent to Mast, Man, NP. Please Advise.

## 2021-11-14 ENCOUNTER — Other Ambulatory Visit: Payer: Self-pay | Admitting: Nurse Practitioner

## 2021-11-14 DIAGNOSIS — M797 Fibromyalgia: Secondary | ICD-10-CM

## 2021-11-14 DIAGNOSIS — M47816 Spondylosis without myelopathy or radiculopathy, lumbar region: Secondary | ICD-10-CM

## 2021-11-14 DIAGNOSIS — M0579 Rheumatoid arthritis with rheumatoid factor of multiple sites without organ or systems involvement: Secondary | ICD-10-CM

## 2021-11-14 DIAGNOSIS — E7800 Pure hypercholesterolemia, unspecified: Secondary | ICD-10-CM

## 2021-11-14 MED ORDER — TRAMADOL HCL 50 MG PO TABS: 50.0000 mg | ORAL_TABLET | Freq: Four times a day (QID) | ORAL | 0 refills | Status: DC

## 2021-11-14 NOTE — Telephone Encounter (Signed)
Patient has request refill on medication "Tramadol 50mg ". Patient last refill was 10/11/2021. Patient has Non Opioid contract on file dated 07/20/2020. Patient has upcoming appointment 01/26/2022. Update contract added to patient appointment notes. Medication pend and sent to Windell Moulding, NP for approval. Please Advise.

## 2021-11-14 NOTE — Telephone Encounter (Signed)
Last cholesterol panel greater than 12 months ago. I will send to provider to review and advise on request

## 2021-11-14 NOTE — Telephone Encounter (Signed)
[facility] Requested refills.  Epic LR: 10/11/2021 Pended Rx and sent to Amy for approval.

## 2021-12-13 ENCOUNTER — Other Ambulatory Visit: Payer: Self-pay | Admitting: Nurse Practitioner

## 2021-12-13 ENCOUNTER — Other Ambulatory Visit: Payer: Self-pay | Admitting: Internal Medicine

## 2021-12-13 DIAGNOSIS — R600 Localized edema: Secondary | ICD-10-CM

## 2021-12-13 NOTE — Telephone Encounter (Signed)
RX last approved on 11/11/2021 by Junie Panning, NP. This is sometimes meant for short-term use. I will send to ManX Mast,MP to review and approve if warranted

## 2021-12-15 ENCOUNTER — Other Ambulatory Visit: Payer: Self-pay | Admitting: Orthopedic Surgery

## 2021-12-15 DIAGNOSIS — M47816 Spondylosis without myelopathy or radiculopathy, lumbar region: Secondary | ICD-10-CM

## 2021-12-15 DIAGNOSIS — M0579 Rheumatoid arthritis with rheumatoid factor of multiple sites without organ or systems involvement: Secondary | ICD-10-CM

## 2021-12-15 DIAGNOSIS — M797 Fibromyalgia: Secondary | ICD-10-CM

## 2021-12-15 NOTE — Telephone Encounter (Signed)
Patient has request refill on medication "Tramadol". Patient last refill was 11/14/2021. Patient has Non Opioid Contract on file dated 07/20/2020. Patient has upcoming appointment 01/26/2022 and UPDATE CONTRACT already added to patient appointment note. Medication pend and sent to Mast, Man, NP.

## 2021-12-19 ENCOUNTER — Other Ambulatory Visit: Payer: Self-pay | Admitting: Nurse Practitioner

## 2021-12-19 DIAGNOSIS — K219 Gastro-esophageal reflux disease without esophagitis: Secondary | ICD-10-CM

## 2021-12-19 NOTE — Telephone Encounter (Signed)
Patient has request refill on medication "Lansoprazole". Patient medication last refilled 06/29/2021. Patient medication has Allergy Contraindications. Medication pend and sent to Mast Man, NP for approval. Please Advise.

## 2021-12-22 ENCOUNTER — Other Ambulatory Visit: Payer: Self-pay | Admitting: Nurse Practitioner

## 2021-12-22 DIAGNOSIS — K219 Gastro-esophageal reflux disease without esophagitis: Secondary | ICD-10-CM

## 2022-01-09 ENCOUNTER — Other Ambulatory Visit: Payer: Self-pay | Admitting: Nurse Practitioner

## 2022-01-09 NOTE — Telephone Encounter (Signed)
Patient has request refill on medication "Prednisone". Patient medication last refilled 12/13/2021 with no refills. I'm unsure if this is ongoing medication for patient. Medication pend and sent to Mast, Man, NP.

## 2022-01-19 ENCOUNTER — Encounter: Payer: Self-pay | Admitting: Nurse Practitioner

## 2022-01-19 ENCOUNTER — Ambulatory Visit (INDEPENDENT_AMBULATORY_CARE_PROVIDER_SITE_OTHER): Payer: Medicare PPO | Admitting: Nurse Practitioner

## 2022-01-19 ENCOUNTER — Telehealth: Payer: Self-pay

## 2022-01-19 ENCOUNTER — Other Ambulatory Visit: Payer: Self-pay

## 2022-01-19 DIAGNOSIS — Z0000 Encounter for general adult medical examination without abnormal findings: Secondary | ICD-10-CM

## 2022-01-19 NOTE — Telephone Encounter (Signed)
Ms. Eileen Wilkerson, Eileen Wilkerson are scheduled for a virtual visit with your provider today.    Just as we do with appointments in the office, we must obtain your consent to participate.  Your consent will be active for this visit and any virtual visit you may have with one of our providers in the next 365 days.    If you have a MyChart account, I can also send a copy of this consent to you electronically.  All virtual visits are billed to your insurance company just like a traditional visit in the office.  As this is a virtual visit, video technology does not allow for your provider to perform a traditional examination.  This may limit your provider's ability to fully assess your condition.  If your provider identifies any concerns that need to be evaluated in person or the need to arrange testing such as labs, EKG, etc, we will make arrangements to do so.    Although advances in technology are sophisticated, we cannot ensure that it will always work on either your end or our end.  If the connection with a video visit is poor, we may have to switch to a telephone visit.  With either a video or telephone visit, we are not always able to ensure that we have a secure connection.   I need to obtain your verbal consent now.   Are you willing to proceed with your visit today?   Eileen Wilkerson has provided verbal consent on 01/19/2022 for a virtual visit (video or telephone).   Carroll Kinds, CMA 01/19/2022  11:03 AM

## 2022-01-19 NOTE — Patient Instructions (Addendum)
Eileen Wilkerson , Thank you for taking time to come for your Medicare Wellness Visit. I appreciate your ongoing commitment to your health goals. Please review the following plan we discussed and let me know if I can assist you in the future.   Screening recommendations/referrals: Colonoscopy aged out Mammogram aged out Bone Density up to date Recommended yearly ophthalmology/optometry visit for glaucoma screening and checkup Recommended yearly dental visit for hygiene and checkup  Vaccinations: Influenza vaccine up to ate Pneumococcal vaccine up tod ate Tdap vaccine RECOMMENDED- to get at local pharmacy Shingles vaccine up to date    Advanced directives: on file.  Conditions/risks identified: advance age, BMI > 30, hyperlipidemia  Next appointment: yearly for awv   Preventive Care 29 Years and Older, Female Preventive care refers to lifestyle choices and visits with your health care provider that can promote health and wellness. What does preventive care include? A yearly physical exam. This is also called an annual well check. Dental exams once or twice a year. Routine eye exams. Ask your health care provider how often you should have your eyes checked. Personal lifestyle choices, including: Daily care of your teeth and gums. Regular physical activity. Eating a healthy diet. Avoiding tobacco and drug use. Limiting alcohol use. Practicing safe sex. Taking low-dose aspirin every day. Taking vitamin and mineral supplements as recommended by your health care provider. What happens during an annual well check? The services and screenings done by your health care provider during your annual well check will depend on your age, overall health, lifestyle risk factors, and family history of disease. Counseling  Your health care provider may ask you questions about your: Alcohol use. Tobacco use. Drug use. Emotional well-being. Home and relationship well-being. Sexual  activity. Eating habits. History of falls. Memory and ability to understand (cognition). Work and work Statistician. Reproductive health. Screening  You may have the following tests or measurements: Height, weight, and BMI. Blood pressure. Lipid and cholesterol levels. These may be checked every 5 years, or more frequently if you are over 59 years old. Skin check. Lung cancer screening. You may have this screening every year starting at age 41 if you have a 30-pack-year history of smoking and currently smoke or have quit within the past 15 years. Fecal occult blood test (FOBT) of the stool. You may have this test every year starting at age 35. Flexible sigmoidoscopy or colonoscopy. You may have a sigmoidoscopy every 5 years or a colonoscopy every 10 years starting at age 36. Hepatitis C blood test. Hepatitis B blood test. Sexually transmitted disease (STD) testing. Diabetes screening. This is done by checking your blood sugar (glucose) after you have not eaten for a while (fasting). You may have this done every 1-3 years. Bone density scan. This is done to screen for osteoporosis. You may have this done starting at age 61. Mammogram. This may be done every 1-2 years. Talk to your health care provider about how often you should have regular mammograms. Talk with your health care provider about your test results, treatment options, and if necessary, the need for more tests. Vaccines  Your health care provider may recommend certain vaccines, such as: Influenza vaccine. This is recommended every year. Tetanus, diphtheria, and acellular pertussis (Tdap, Td) vaccine. You may need a Td booster every 10 years. Zoster vaccine. You may need this after age 26. Pneumococcal 13-valent conjugate (PCV13) vaccine. One dose is recommended after age 58. Pneumococcal polysaccharide (PPSV23) vaccine. One dose is recommended after age 65.  Talk to your health care provider about which screenings and vaccines  you need and how often you need them. This information is not intended to replace advice given to you by your health care provider. Make sure you discuss any questions you have with your health care provider. Document Released: 12/24/2015 Document Revised: 08/16/2016 Document Reviewed: 09/28/2015 Elsevier Interactive Patient Education  2017 [place] Prevention in the Home Falls can cause injuries. They can happen to people of all ages. There are many things you can do to make your home safe and to help prevent falls. What can I do on the outside of my home? Regularly fix the edges of walkways and driveways and fix any cracks. Remove anything that might make you trip as you walk through a door, such as a raised step or threshold. Trim any bushes or trees on the path to your home. Use bright outdoor lighting. Clear any walking paths of anything that might make someone trip, such as rocks or tools. Regularly check to see if handrails are loose or broken. Make sure that both sides of any steps have handrails. Any raised decks and porches should have guardrails on the edges. Have any leaves, snow, or ice cleared regularly. Use sand or salt on walking paths during winter. Clean up any spills in your garage right away. This includes oil or grease spills. What can I do in the bathroom? Use night lights. Install grab bars by the toilet and in the tub and shower. Do not use towel bars as grab bars. Use non-skid mats or decals in the tub or shower. If you need to sit down in the shower, use a plastic, non-slip stool. Keep the floor dry. Clean up any water that spills on the floor as soon as it happens. Remove soap buildup in the tub or shower regularly. Attach bath mats securely with double-sided non-slip rug tape. Do not have throw rugs and other things on the floor that can make you trip. What can I do in the bedroom? Use night lights. Make sure that you have a light by your bed that  is easy to reach. Do not use any sheets or blankets that are too big for your bed. They should not hang down onto the floor. Have a firm chair that has side arms. You can use this for support while you get dressed. Do not have throw rugs and other things on the floor that can make you trip. What can I do in the kitchen? Clean up any spills right away. Avoid walking on wet floors. Keep items that you use a lot in easy-to-reach places. If you need to reach something above you, use a strong step stool that has a grab bar. Keep electrical cords out of the way. Do not use floor polish or wax that makes floors slippery. If you must use wax, use non-skid floor wax. Do not have throw rugs and other things on the floor that can make you trip. What can I do with my stairs? Do not leave any items on the stairs. Make sure that there are handrails on both sides of the stairs and use them. Fix handrails that are broken or loose. Make sure that handrails are as long as the stairways. Check any carpeting to make sure that it is firmly attached to the stairs. Fix any carpet that is loose or worn. Avoid having throw rugs at the top or bottom of the stairs. If you do have throw  rugs, attach them to the floor with carpet tape. Make sure that you have a light switch at the top of the stairs and the bottom of the stairs. If you do not have them, ask someone to add them for you. What else can I do to help prevent falls? Wear shoes that: Do not have high heels. Have rubber bottoms. Are comfortable and fit you well. Are closed at the toe. Do not wear sandals. If you use a stepladder: Make sure that it is fully opened. Do not climb a closed stepladder. Make sure that both sides of the stepladder are locked into place. Ask someone to hold it for you, if possible. Clearly mark and make sure that you can see: Any grab bars or handrails. First and last steps. Where the edge of each step is. Use tools that help you  move around (mobility aids) if they are needed. These include: Canes. Walkers. Scooters. Crutches. Turn on the lights when you go into a dark area. Replace any light bulbs as soon as they burn out. Set up your furniture so you have a clear path. Avoid moving your furniture around. If any of your floors are uneven, fix them. If there are any pets around you, be aware of where they are. Review your medicines with your doctor. Some medicines can make you feel dizzy. This can increase your chance of falling. Ask your doctor what other things that you can do to help prevent falls. This information is not intended to replace advice given to you by your health care provider. Make sure you discuss any questions you have with your health care provider. Document Released: 09/23/2009 Document Revised: 05/04/2016 Document Reviewed: 01/01/2015 Elsevier Interactive Patient Education  2017 [employer].

## 2022-01-19 NOTE — Progress Notes (Signed)
Subjective:   Eileen Wilkerson is a 88 y.o. female who presents for Medicare Annual (Subsequent) preventive examination.  Review of Systems     Cardiac Risk Factors include: advanced age (>21men, >59 women);sedentary lifestyle;dyslipidemia;obesity (BMI >30kg/m2)     Objective:    Today's Vitals   01/19/22 1113  PainSc: 5    There is no height or weight on file to calculate BMI.  Advanced Directives 01/19/2022 06/23/2021 03/28/2021 03/11/2021 01/11/2021 08/10/2020 06/23/2020  Does Patient Have a Medical Advance Directive? Yes Yes Yes Yes Yes Yes No  Type of Paramedic of [place];Living will Healthcare Power of [place];Living will [place];Living will Healthcare Power of [place] -  Does patient want to make changes to medical advance directive? No - Patient declined No - Patient declined No - Patient declined No - Patient declined No - Patient declined No - Patient declined -  Copy of [place] in Chart? Yes - validated most recent copy scanned in chart (See row information) Yes - validated most recent copy scanned in chart (See row information) Yes - validated most recent copy scanned in chart (See row information) Yes - validated most recent copy scanned in chart (See row information) - Yes - validated most recent copy scanned in chart (See row information) -  Would patient like information on creating a medical advance directive? - - - - - - No - Patient declined    Current Medications (verified) Outpatient Encounter Medications as of 01/19/2022  Medication Sig   acetaminophen (TYLENOL) 500 MG tablet Take 500 mg by mouth every 6 (six) hours.    atorvastatin (LIPITOR) 20 MG tablet TAKE 1 TABLET BY MOUTH EVERY DAY   Biotin 10 MG TABS Take 10 mg by mouth daily.   Cholecalciferol (VITAMIN D3) 5000 units CAPS Take 5,000 Units by mouth daily.   CRANBERRY PO Take 1 capsule by  mouth daily.   cycloSPORINE (RESTASIS) 0.05 % ophthalmic emulsion Place 1 drop into both eyes 2 (two) times daily.    diclofenac Sodium (VOLTAREN) 1 % GEL APPLY 2 TO 4 GRAMS TOPICALLY TO AFFECTED JOINTS UP TO 4 TIMES DAILY   EPINEPHrine 0.3 mg/0.3 mL IJ SOAJ injection Inject 0.3 mg into the muscle as needed for anaphylaxis.    fexofenadine (ALLEGRA) 180 MG tablet Take 180 mg by mouth daily.   fluticasone (FLONASE) 50 MCG/ACT nasal spray SPRAY 2 SPRAYS INTO EACH NOSTRIL EVERY DAY   furosemide (LASIX) 80 MG tablet Take 1 tablet by mouth once daily   ketoconazole (NIZORAL) 2 % shampoo Apply 1 application topically once a week.    lansoprazole (PREVACID) 30 MG capsule TAKE 1 CAPSULE BY MOUTH ONCE DAILY AT  12  NOON   MELATONIN PO Take 10 mg by mouth daily.    montelukast (SINGULAIR) 10 MG tablet TAKE 1 TABLET BY MOUTH AT BEDTIME   potassium chloride (KLOR-CON) 10 MEQ tablet TAKE 2  BY MOUTH TWICE DAILY   predniSONE (DELTASONE) 10 MG tablet Take 1 tablet by mouth once daily with breakfast   Syringe/Needle, Disp, (SYRINGE 3CC/25GX1") 25G X 1" 3 ML MISC 1 application by Does not apply route every 30 (thirty) days.   traMADol (ULTRAM) 50 MG tablet TAKE 1 TABLET BY MOUTH 4 TIMES DAILY.   vitamin B-12 (CYANOCOBALAMIN) 1000 MCG tablet Take 1 tablet (1,000 mcg total) by mouth daily.   [DISCONTINUED] pantoprazole (PROTONIX) 40 MG tablet Take 1 tablet (  40 mg total) by mouth daily.   No facility-administered encounter medications on file as of 01/19/2022.    Allergies (verified) Adhesive [tape], Celebrex [celecoxib], Ciprofibrate, Cymbalta [duloxetine hcl], Gabitril [tiagabine], Keflex [cephalexin], Lyrica [pregabalin], Neurontin [gabapentin], Nexium [esomeprazole], Nsaids, Penicillins, Shrimp [shellfish allergy], Sulfa antibiotics, Azactam [aztreonam], Azelastine hcl, Ciprofloxacin, Claritin [loratadine], Methotrexate derivatives, Nasacort [triamcinolone], Olopatadine, Other, Sulfamethizole, Zantac [ranitidine  hcl], and Claritin-d 12 hour [loratadine-pseudoephedrine er]   History: Past Medical History:  Diagnosis Date   Adrenal failure ([place])    Arthritis    Asthma    Cancer ([place])    Cataract    Closed nondisplaced fracture of fifth right metatarsal bone 09/18/2017   Diverticulitis    Per patient   Fibromyalgia 2008   HCAP (healthcare-associated pneumonia) 02/03/2018   Osteoporosis    RA (rheumatoid arthritis) ([place])    Recurrent upper respiratory infection (URI)    Sepsis due to urinary tract infection ([place]) 01/18/2018   Urticaria    Past Surgical History:  Procedure Laterality Date   BACK SURGERY     BILATERAL CARPAL TUNNEL RELEASE  2005   right and left   CATARACT EXTRACTION, BILATERAL  2004   right and left   CERVICAL FUSION  2011,2010,2008   2 disks   HEEL SPUR SURGERY  2004   lower back fusion  2011   Fusion of 3-4 and 4-5 lower back   RADIOFREQUENCY ABLATION  2020   ROTATOR CUFF REPAIR  [id number]   SQUAMOUS CELL CARCINOMA EXCISION     TONSILLECTOMY AND ADENOIDECTOMY  1947   TOTAL SHOULDER ARTHROPLASTY     Family History  Problem Relation Age of Onset   Heart attack Maternal Grandmother    Heart attack Paternal Grandfather    Breast cancer Mother 48   Diabetes Father    Heart disease Father    Congestive Heart Failure Father 36       Died from   Allergic rhinitis Neg Hx    Asthma Neg Hx    Eczema Neg Hx    Urticaria Neg Hx    Social History   Socioeconomic History   Marital status: Widowed    Spouse name: Not on file   Number of children: Not on file   Years of education: Not on file   Highest education level: Not on file  Occupational History   Not on file  Tobacco Use   Smoking status: Former    Packs/day: 0.75    Years: 10.00    Pack years: 7.50    Types: Cigarettes    Quit date: 51    Years since quitting: 55.1   Smokeless tobacco: Never  Vaping Use   Vaping Use: Never used  Substance and Sexual Activity   Alcohol use: No   Drug use: No    Sexual activity: Not Currently  Other Topics Concern   Not on file  Social History Narrative      Diet:        Do you drink/ eat things with caffeine? Dr. Malachi Bonds 2/ day      Marital status: Widowed                              What year were you married ? 1953      Do you live in a house, apartment,assistred living, condo, trailer, etc.)? Apartment      Is it one or more stories?  How many persons live in your home ? 1      Do you have any pets in your home ?(please list) No      Highest Level of education completed: PhD       Current or past profession: Transport planner, Education officer, museum, Special Educator       Do you exercise?   No                           Type & how often       ADVANCED DIRECTIVES (Please bring copies)      Do you have a living will? Yes      Do you have a DNR form?                       If not, do you want to discuss one? Yes      Do you have signed POA?HPOA forms?                 If so, please bring to your appointment Tes      FUNCTIONAL STATUS- To be completed by Spouse / child / Staff       Do you have difficulty bathing or dressing yourself ? No      Do you have difficulty preparing food or eating ? No      Do you have difficulty managing your mediation ? No      Do you have difficulty managing your finances ? No      Do you have difficulty affording your medication ? No      Social Determinants of Radio broadcast assistant Strain: Not on file  Food Insecurity: Not on file  Transportation Needs: Not on file  Physical Activity: Not on file  Stress: Not on file  Social Connections: Not on file    Tobacco Counseling Counseling given: Not Answered   Clinical Intake:  Pre-visit preparation completed: Yes  Pain : 0-10 Pain Score: 5  Pain Type: Chronic pain Pain Location: Back Pain Orientation: Right Pain Radiating Towards: hip/buttock Pain Onset: More than a month ago Pain Frequency: Intermittent Pain Relieving Factors:  sitting  Pain Relieving Factors: sitting  BMI - recorded: 31 Nutritional Status: BMI > 30  Obese Nutritional Risks: None  How often do you need to have someone help you when you read instructions, pamphlets, or other written materials from your doctor or pharmacy?: 1 - Never  Diabetic?no         Activities of Daily Living In your present state of health, do you have any difficulty performing the following activities: 01/19/2022  Hearing? Y  Vision? Y  Difficulty concentrating or making decisions? N  Walking or climbing stairs? Y  Comment does not climb stairs, does not walk distances  Dressing or bathing? N  Doing errands, shopping? Y  Preparing Food and eating ? N  Using the Toilet? N  In the past six months, have you accidently leaked urine? Y  Do you have problems with loss of bowel control? N  Managing your Medications? N  Managing your Finances? N  Housekeeping or managing your Housekeeping? N  Some recent data might be hidden    Patient Care Team: [name], MD as PCP - General (Internal Medicine)  Indicate any recent Medical Services you may have received from other than [facility] providers in the past year (date may be approximate).  Assessment:   This is a routine wellness examination for Eileen Wilkerson.  Hearing/Vision screen Hearing Screening - Comments:: Patient has hearing problems Vision Screening - Comments:: Patient has blurry vision. Patient has upcoming appointment. Patient can't remember eye doctor's name  Dietary issues and exercise activities discussed: Current Exercise Habits: The patient does not participate in regular exercise at present   Goals Addressed   None    Depression Screen PHQ 2/9 Scores 01/19/2022 10/13/2021 01/11/2021 06/09/2019 12/30/2018 07/09/2018 07/09/2018  PHQ - 2 Score 0 0 0 0 0 0 0  PHQ- 9 Score - - - 3 4 - 0    Fall Risk Fall Risk  01/19/2022 10/27/2021 10/13/2021 06/23/2021 03/28/2021  Falls in the past year? 0 0 0 0 0   Number falls in past yr: 0 0 0 0 0  Comment - - - - -  Injury with Fall? 0 0 0 0 0  Comment - - - - -  Risk Factor Category  - - - - -  Risk for fall due to : No Fall Risks No Fall Risks No Fall Risks No Fall Risks -  Risk for fall due to: Comment - - - - -  Follow up Falls evaluation completed Falls evaluation completed Falls evaluation completed Falls evaluation completed -    FALL RISK PREVENTION PERTAINING TO THE HOME:  Any stairs in or around the home? No  If so, are there any without handrails? No  Home free of loose throw rugs in walkways, pet beds, electrical cords, etc? Yes  Adequate lighting in your home to reduce risk of falls? Yes   ASSISTIVE DEVICES UTILIZED TO PREVENT FALLS:  Life alert? Yes  Use of a cane, walker or w/c? Yes  Grab bars in the bathroom? Yes  Shower chair or bench in shower? Yes  Elevated toilet seat or a handicapped toilet? Yes   TIMED UP AND GO:  Was the test performed? No .   Cognitive Function:     6CIT Screen 01/19/2022 01/11/2021 07/09/2018  What Year? 0 points 0 points 0 points  What month? 0 points 0 points 0 points  What time? 0 points 0 points 0 points  Count back from 20 0 points 0 points 0 points  Months in reverse 0 points 0 points 0 points  Repeat phrase 0 points 0 points -  Total Score 0 0 -    Immunizations Immunization History  Administered Date(s) Administered   Fluad Quad(high Dose 65+) 09/30/2020   Influenza, High Dose Seasonal PF 08/16/2018, 09/08/2019, 09/24/2020, 10/04/2021   Influenza,inj,Quad PF,6+ Mos 08/11/2016   Influenza-Unspecified 11/12/2017   Moderna Covid-19 Vaccine Bivalent Booster 69yrs & up 10/04/2021   Moderna Sars-Covid-2 Vaccination 12/15/2019, 01/12/2020, 10/19/2020   PNEUMOCOCCAL CONJUGATE-20 10/04/2021   PPD Test 03/01/2018   Pneumococcal Conjugate-13 06/26/2017   Pneumococcal Polysaccharide-23 12/11/2009   Zoster Recombinat (Shingrix) 01/28/2019, 09/02/2019    TDAP status: Due, Education  has been provided regarding the importance of this vaccine. Advised may receive this vaccine at local pharmacy or Health Dept. Aware to provide a copy of the vaccination record if obtained from local pharmacy or Health Dept. Verbalized acceptance and understanding.  Flu Vaccine status: Up to date  Pneumococcal vaccine status: Up to date  Covid-19 vaccine status: Completed vaccines  Qualifies for Shingles Vaccine? Yes   Zostavax completed No   Shingrix Completed?: Yes  Screening Tests Health Maintenance  Topic Date Due   TETANUS/TDAP  Never done   Pneumonia Vaccine 65+ Years  old  Completed   INFLUENZA VACCINE  Completed   DEXA SCAN  Completed   COVID-19 Vaccine  Completed   Zoster Vaccines- Shingrix  Completed   HPV VACCINES  Aged Out    Health Maintenance  Health Maintenance Due  Topic Date Due   TETANUS/TDAP  Never done    Colorectal cancer screening: No longer required.   Mammogram status: No longer required due to age.  Bone Density status: Completed 06/06/21. Results reflect: Bone density results: OSTEOPENIA. Repeat every 2 years.  Lung Cancer Screening: (Low Dose CT Chest recommended if Age 63-80 years, 30 pack-year currently smoking OR have quit w/in 15years.) does not qualify.   Lung Cancer Screening Referral: na  Additional Screening:  Hepatitis C Screening: does not qualify  Vision Screening: Recommended annual ophthalmology exams for early detection of glaucoma and other disorders of the eye. Is the patient up to date with their annual eye exam?  Yes  Who is the provider or what is the name of the office in which the patient attends annual eye exams? [facility]  If pt is not established with a provider, would they like to be referred to a provider to establish care? No .   Dental Screening: Recommended annual dental exams for proper oral hygiene  Community Resource Referral / Chronic Care Management: CRR required this visit?  No   CCM required this  visit?  No      Plan:     I have personally reviewed and noted the following in the patients chart:   Medical and social history Use of alcohol, tobacco or illicit drugs  Current medications and supplements including opioid prescriptions.  Functional ability and status Nutritional status Physical activity Advanced directives List of other physicians Hospitalizations, surgeries, and ER visits in previous 12 months Vitals Screenings to include cognitive, depression, and falls Referrals and appointments  In addition, I have reviewed and discussed with patient certain preventive protocols, quality metrics, and best practice recommendations. A written personalized care plan for preventive services as well as general preventive health recommendations were provided to patient.     Lauree Chandler, NP   01/19/2022    Virtual Visit via Telephone Note  I connected with patient 01/19/22 at 11:00 AM EST by telephone and verified that I am speaking with the correct person using two identifiers.  Location: Patient: home Provider: [facility]   I discussed the limitations, risks, security and privacy concerns of performing an evaluation and management service by telephone and the availability of in person appointments. I also discussed with the patient that there may be a patient responsible charge related to this service. The patient expressed understanding and agreed to proceed.   I discussed the assessment and treatment plan with the patient. The patient was provided an opportunity to ask questions and all were answered. The patient agreed with the plan and demonstrated an understanding of the instructions.   The patient was advised to call back or seek an in-person evaluation if the symptoms worsen or if the condition fails to improve as anticipated.  I provided 16 minutes of non-face-to-face time during this encounter.  [employer]os [employer]. Harle Battiest Avs printed and mailed

## 2022-01-19 NOTE — Progress Notes (Signed)
This service is provided via telemedicine  No vital signs collected/recorded due to the encounter was a telemedicine visit.   Location of patient (ex: home, work):  Home  Patient consents to a telephone visit:  Yes, see encounter dated 01/19/2022  Location of the provider (ex: office, home):  [place]   Name of any referring provider:  Veleta Miners, MD  Names of all persons participating in the telemedicine service and their role in the encounter:  Sherrie Mustache, Nurse Practitioner, Carroll Kinds, CMA, and patient.    Time spent on call:  17 minutes with medical assistant

## 2022-01-26 ENCOUNTER — Other Ambulatory Visit: Payer: Self-pay

## 2022-01-26 ENCOUNTER — Encounter: Payer: Self-pay | Admitting: Nurse Practitioner

## 2022-01-26 ENCOUNTER — Non-Acute Institutional Stay: Payer: Medicare PPO | Admitting: Nurse Practitioner

## 2022-01-26 DIAGNOSIS — R0602 Shortness of breath: Secondary | ICD-10-CM | POA: Diagnosis not present

## 2022-01-26 DIAGNOSIS — E782 Mixed hyperlipidemia: Secondary | ICD-10-CM

## 2022-01-26 DIAGNOSIS — K5720 Diverticulitis of large intestine with perforation and abscess without bleeding: Secondary | ICD-10-CM | POA: Diagnosis not present

## 2022-01-26 DIAGNOSIS — I872 Venous insufficiency (chronic) (peripheral): Secondary | ICD-10-CM

## 2022-01-26 DIAGNOSIS — K219 Gastro-esophageal reflux disease without esophagitis: Secondary | ICD-10-CM

## 2022-01-26 DIAGNOSIS — N949 Unspecified condition associated with female genital organs and menstrual cycle: Secondary | ICD-10-CM

## 2022-01-26 DIAGNOSIS — J45909 Unspecified asthma, uncomplicated: Secondary | ICD-10-CM | POA: Diagnosis not present

## 2022-01-26 DIAGNOSIS — M810 Age-related osteoporosis without current pathological fracture: Secondary | ICD-10-CM

## 2022-01-26 DIAGNOSIS — R000 Tachycardia, unspecified: Secondary | ICD-10-CM | POA: Diagnosis not present

## 2022-01-26 DIAGNOSIS — E538 Deficiency of other specified B group vitamins: Secondary | ICD-10-CM

## 2022-01-26 DIAGNOSIS — M797 Fibromyalgia: Secondary | ICD-10-CM

## 2022-01-26 DIAGNOSIS — R269 Unspecified abnormalities of gait and mobility: Secondary | ICD-10-CM

## 2022-01-26 NOTE — Assessment & Plan Note (Signed)
Rheumatology, takes Alendronate, Vit D, t score -1.8 06/06/21, no recent fxs.

## 2022-01-26 NOTE — Assessment & Plan Note (Signed)
takes Furosemide 80mg  qd, Kcl. Bun/creat 29/1.14 06/20/21. BNP 46 10/17/21

## 2022-01-26 NOTE — Assessment & Plan Note (Addendum)
CT left ovary adnexa cyst 11/2020, MRI 05/08/21, recommended watchful waiting from her Dr. Saintclair Halsted.

## 2022-01-26 NOTE — Assessment & Plan Note (Addendum)
Heart rate is 100s, DOE, denied palpitation, chest pain, or O2 desaturation, cardiology referral.

## 2022-01-26 NOTE — Progress Notes (Signed)
.  Location:   clinic [place]   Place of Service:    Provider: Marlana Latus NP  Code Status: DNR Goals of Care:  Advanced Directives 01/19/2022  Does Patient Have a Medical Advance Directive? Yes  Type of Paramedic of [place];Living will  Does patient want to make changes to medical advance directive? No - Patient declined  Copy of [place] in Chart? Yes - validated most recent copy scanned in chart (See row information)  Would patient like information on creating a medical advance directive? -     Chief Complaint  Patient presents with   Medical Management of Chronic Issues    Patient presents today for a 3 month follow-up.   Quality Metric Gaps    TDAP    HPI: Patient is a 88 y.o. female seen today for medical management of chronic diseases.       DOE, on and off wheezing, cough, occasional yellow phlegm in am. CXR 10/14/21 no acute abnormality of the lungs. Echo EF 55-60/5 2017. BNP 46 10/17/21             Hx of sinus tachycardia, EKG 06/25/20, HR 100s.              Hx of aute sigmoid diverticulitis with pneumoperitoneum, treated with  Omnicef/Falgyl 06/2020, f/u Dr. [name]             Asthma Epinephrine prn, Allegra, Flonase, Singulair             RA/fibromyalgia,  takes Prednisone, Tramadol, TSH 2.04 07/27/20             GERD Prevacid 30mg  qd, Hgb 13.5 02/03/21             Venous insufficiency/Edema , takes Furosemide 80mg  qd, Kcl. Bun/creat 29/1.14 06/20/21. BNP 46 10/17/21             OP Rheumatology, takes Alendronate, Vit D, t score -1.8 06/06/21             Hyperlipidemia, takes Atorvastatin             Vit B12 deficiency, on Vit B12 po, Vit B12 235 06/20/21             CT left adnexa cyst 11/2020, MRI 05/08/21,  f/u US 6-12 months.    Past Medical History:  Diagnosis Date   Adrenal failure ([place])    Arthritis    Asthma    Cancer ([place])    Cataract    Closed nondisplaced fracture of fifth right metatarsal bone 09/18/2017    Diverticulitis    Per patient   Fibromyalgia 2008   HCAP (healthcare-associated pneumonia) 02/03/2018   Osteoporosis    RA (rheumatoid arthritis) ([place])    Recurrent upper respiratory infection (URI)    Sepsis due to urinary tract infection ([place]) 01/18/2018   Urticaria     Past Surgical History:  Procedure Laterality Date   BACK SURGERY     BILATERAL CARPAL TUNNEL RELEASE  2005   right and left   CATARACT EXTRACTION, BILATERAL  2004   right and left   CERVICAL FUSION  2011,2010,2008   2 disks   HEEL SPUR SURGERY  2004   lower back fusion  2011   Fusion of 3-4 and 4-5 lower back   RADIOFREQUENCY ABLATION  2020   ROTATOR CUFF REPAIR  [id number]   SQUAMOUS CELL CARCINOMA EXCISION     TONSILLECTOMY AND ADENOIDECTOMY  1947   TOTAL SHOULDER  ARTHROPLASTY      Allergies  Allergen Reactions   Adhesive [Tape] Rash   Celebrex [Celecoxib] Hives   Ciprofibrate Nausea Only   Cymbalta [Duloxetine Hcl] Swelling   Gabitril [Tiagabine] Swelling   Keflex [Cephalexin] Nausea And Vomiting   Lyrica [Pregabalin] Swelling   Neurontin [Gabapentin] Swelling   Nexium [Esomeprazole] Rash   Nsaids Rash   Penicillins Rash    Injection site reaction. Tolerated cefepime in past Has patient had a PCN reaction causing immediate rash, facial/tongue/throat swelling, SOB or lightheadedness with hypotension: No Has patient had a PCN reaction causing severe rash involving mucus membranes or skin necrosis: No Has patient had a PCN reaction that required hospitalization No Has patient had a PCN reaction occurring within the last 10 years: No If all of the above answers are "NO", then may proceed with Cephalosporin use.    Shrimp [Shellfish Allergy] Anaphylaxis    Per patient "shrimp only"   Sulfa Antibiotics Nausea And Vomiting   Azactam [Aztreonam]     Hand swelling    Azelastine Hcl     Rash    Ciprofloxacin Other (See Comments)    dizziness   Claritin [Loratadine]     Irritability Nervousness     Methotrexate Derivatives    Nasacort [Triamcinolone]     Dizzy    Olopatadine Other (See Comments)    Pain and lethargy    Other    Sulfamethizole Other (See Comments)    unknown   Zantac [Ranitidine Hcl] Other (See Comments)    unknown   Claritin-D 12 Hour [Loratadine-Pseudoephedrine Er] Anxiety    Allergies as of 01/26/2022       Reactions   Adhesive [tape] Rash   Celebrex [celecoxib] Hives   Ciprofibrate Nausea Only   Cymbalta [duloxetine Hcl] Swelling   Gabitril [tiagabine] Swelling   Keflex [cephalexin] Nausea And Vomiting   Lyrica [pregabalin] Swelling   Neurontin [gabapentin] Swelling   Nexium [esomeprazole] Rash   Nsaids Rash   Penicillins Rash   Injection site reaction. Tolerated cefepime in past Has patient had a PCN reaction causing immediate rash, facial/tongue/throat swelling, SOB or lightheadedness with hypotension: No Has patient had a PCN reaction causing severe rash involving mucus membranes or skin necrosis: No Has patient had a PCN reaction that required hospitalization No Has patient had a PCN reaction occurring within the last 10 years: No If all of the above answers are "NO", then may proceed with Cephalosporin use.   Shrimp [shellfish Allergy] Anaphylaxis   Per patient "shrimp only"   Sulfa Antibiotics Nausea And Vomiting   Azactam [aztreonam]    Hand swelling    Azelastine Hcl    Rash    Ciprofloxacin Other (See Comments)   dizziness   Claritin [loratadine]    Irritability Nervousness    Methotrexate Derivatives    Nasacort [triamcinolone]    Dizzy    Olopatadine Other (See Comments)   Pain and lethargy    Other    Sulfamethizole Other (See Comments)   unknown   Zantac [ranitidine Hcl] Other (See Comments)   unknown   Claritin-d 12 Hour [loratadine-pseudoephedrine Er] Anxiety        Medication List        Accurate as of January 26, 2022  4:16 PM. If you have any questions, ask your nurse or doctor.          acetaminophen 500  MG tablet Commonly known as: TYLENOL Take 500 mg by mouth every 6 (six) hours.  atorvastatin 20 MG tablet Commonly known as: LIPITOR TAKE 1 TABLET BY MOUTH EVERY DAY   Biotin 10 MG Tabs Take 10 mg by mouth daily.   CRANBERRY PO Take 1 capsule by mouth daily.   cycloSPORINE 0.05 % ophthalmic emulsion Commonly known as: RESTASIS Place 1 drop into both eyes 2 (two) times daily.   diclofenac Sodium 1 % Gel Commonly known as: VOLTAREN APPLY 2 TO 4 GRAMS TOPICALLY TO AFFECTED JOINTS UP TO 4 TIMES DAILY   EPINEPHrine 0.3 mg/0.3 mL Soaj injection Commonly known as: EPI-PEN Inject 0.3 mg into the muscle as needed for anaphylaxis.   fexofenadine 180 MG tablet Commonly known as: ALLEGRA Take 180 mg by mouth daily.   fluticasone 50 MCG/ACT nasal spray Commonly known as: FLONASE SPRAY 2 SPRAYS INTO EACH NOSTRIL EVERY DAY   furosemide 80 MG tablet Commonly known as: LASIX Take 1 tablet by mouth once daily   ketoconazole 2 % shampoo Commonly known as: NIZORAL Apply 1 application topically once a week.   lansoprazole 30 MG capsule Commonly known as: PREVACID TAKE 1 CAPSULE BY MOUTH ONCE DAILY AT  12  NOON   MELATONIN PO Take 10 mg by mouth daily.   montelukast 10 MG tablet Commonly known as: SINGULAIR TAKE 1 TABLET BY MOUTH AT BEDTIME   potassium chloride 10 MEQ tablet Commonly known as: KLOR-CON TAKE 2  BY MOUTH TWICE DAILY   predniSONE 10 MG tablet Commonly known as: DELTASONE Take 1 tablet by mouth once daily with breakfast   SYRINGE 3CC/25GX1" 25G X 1" 3 ML Misc 1 application by Does not apply route every 30 (thirty) days.   traMADol 50 MG tablet Commonly known as: ULTRAM TAKE 1 TABLET BY MOUTH 4 TIMES DAILY.   vitamin B-12 1000 MCG tablet Commonly known as: CYANOCOBALAMIN Take 1 tablet (1,000 mcg total) by mouth daily.   Vitamin D3 125 MCG (5000 UT) Caps Take 5,000 Units by mouth daily.        Review of Systems:  Review of Systems   Constitutional:  Positive for fatigue. Negative for appetite change and fever.  HENT:  Positive for postnasal drip. Negative for congestion, sinus pressure, sinus pain, sore throat and voice change.   Eyes:  Negative for visual disturbance.  Respiratory:  Positive for cough, shortness of breath and wheezing. Negative for chest tightness.        DOE, occasional yellow phlegm in am, hacking cough. Left lateral chest wall pain when pushing up self in bed, no associated with deep breathing or turning from side to side.   Cardiovascular:  Positive for leg swelling.  Gastrointestinal:  Negative for abdominal pain and constipation.       Indegestion  Genitourinary:  Negative for dysuria and urgency.       Incontinent of urine.   Musculoskeletal:  Positive for arthralgias, back pain, gait problem and myalgias.       Right lower back hip pain, travels down to the right leg. Sometimes 2nd or 3rd costochondritis pain-not today  Skin:  Negative for color change.  Neurological:  Negative for speech difficulty, weakness and light-headedness.  Psychiatric/Behavioral:  Negative for confusion and sleep disturbance. The patient is not nervous/anxious.    Health Maintenance  Topic Date Due   TETANUS/TDAP  Never done   Pneumonia Vaccine 48+ Years old  Completed   INFLUENZA VACCINE  Completed   DEXA SCAN  Completed   COVID-19 Vaccine  Completed   Zoster Vaccines- Shingrix  Completed  HPV VACCINES  Aged Out    Physical Exam: Vitals:   01/26/22 1333  BP: 122/78  Pulse: (!) 110  Temp: (!) 96.6 F (35.9 C)  SpO2: 97%  Weight: 182 lb 3.2 oz (82.6 kg)  Height: 5\' 4"  (1.626 m)   Body mass index is 31.27 kg/m. Physical Exam Vitals and nursing note reviewed.  Constitutional:      Appearance: Normal appearance.  HENT:     Head: Normocephalic and atraumatic.     Ears:     Comments: A small abrasion from hearing aid at the opening of the right external ear canal, not infected.     Nose: Nose  normal. No rhinorrhea.     Mouth/Throat:     Mouth: Mucous membranes are moist.     Pharynx: No oropharyngeal exudate or posterior oropharyngeal erythema.  Eyes:     Extraocular Movements: Extraocular movements intact.     Conjunctiva/sclera: Conjunctivae normal.     Pupils: Pupils are equal, round, and reactive to light.  Neck:     Comments: C/o left upper back/neck pain, better with warm compress, will work with therapy Cardiovascular:     Rate and Rhythm: Regular rhythm. Tachycardia present.     Heart sounds: No murmur heard.    Comments: HR was 98 bpm upon my examination.  Pulmonary:     Effort: Pulmonary effort is normal.     Breath sounds: No wheezing, rhonchi or rales.  Chest:     Chest wall: No tenderness.  Abdominal:     General: Bowel sounds are normal.     Palpations: Abdomen is soft.     Tenderness: There is no abdominal tenderness.  Musculoskeletal:     Cervical back: Normal range of motion and neck supple.     Right lower leg: Edema present.     Left lower leg: Edema present.     Comments: Trace edema BLE. No tenderness palpated chest wall.   Skin:    General: Skin is warm and dry.     Comments: Lots of moles.   Neurological:     General: No focal deficit present.     Mental Status: She is alert and oriented to person, place, and time. Mental status is at baseline.     Gait: Gait abnormal.  Psychiatric:        Mood and Affect: Mood normal.        Behavior: Behavior normal.        Thought Content: Thought content normal.        Judgment: Judgment normal.    Labs reviewed: Basic Metabolic Panel: Recent Labs    02/03/21 1422 06/20/21 0920  NA 141 145  K 4.3 4.0  CL 100 103  CO2 33* 29  GLUCOSE 103* 75  BUN 23 29*  CREATININE 1.05* 1.14*  CALCIUM 9.8 10.2   Liver Function Tests: Recent Labs    02/03/21 1422 06/20/21 0920  AST 14 16  ALT 11 15  BILITOT 0.6 0.5  PROT 7.0 7.4   No results for input(s): LIPASE, AMYLASE in the last 8760  hours. No results for input(s): AMMONIA in the last 8760 hours. CBC: Recent Labs    02/03/21 1422  WBC 15.0*  NEUTROABS 12,645*  HGB 13.5  HCT 41.0  MCV 93.8  PLT 272   Lipid Panel: No results for input(s): CHOL, HDL, LDLCALC, TRIG, CHOLHDL, LDLDIRECT in the last 8760 hours. No results found for: HGBA1C  Procedures since last visit: No results  found.  Assessment/Plan  SOB (shortness of breath) on exertion DOE, on and off wheezing, cough, occasional yellow phlegm in am. CXR 10/14/21 no acute abnormality of the lungs. Echo EF 55-60/5 2017. BNP 46 10/17/21 01/26/22 left chest wall pain when pushing up self in bed, not associated with deep breathing, will CXR to r/o cardiopulmonary process.   Sinus tachycardia Heart rate is 100s, DOE, denied palpitation, chest pain, or O2 desaturation, cardiology referral.   Perforation of sigmoid colon due to diverticulitis Hx of aute sigmoid diverticulitis with pneumoperitoneum, treated with  Omnicef/Falgyl 06/2020, f/u Dr. [name]  Asthma DOE, Asthma Epinephrine prn, Allegra, Flonase, Singulair  Fibromyalgia RA/fibromyalgia,  takes Prednisone, Tramadol, TSH 2.04 07/27/20  GERD (gastroesophageal reflux disease) Prevacid 30mg  qd, Hgb 13.5 02/03/21  Edema of both lower extremities due to peripheral venous insufficiency  takes Furosemide 80mg  qd, Kcl. Bun/creat 29/1.14 06/20/21. BNP 46 10/17/21  Age-related osteoporosis without current pathological fracture Rheumatology, takes Alendronate, Vit D, t score -1.8 06/06/21, no recent fxs.   Hyperlipidemia takes Atorvastatin  B12 deficiency  on Vit B12 po, Vit B12 235 06/20/21  Adnexal cyst  CT left ovary adnexa cyst 11/2020, MRI 05/08/21, recommended watchful waiting from her Dr. Saintclair Halsted.   Gait abnormality Ambulates with walker, therapy to eval for ? W/c.   Labs/tests ordered:  CXR ap, labs next Tuesday   Next appt:  3 months

## 2022-01-26 NOTE — Assessment & Plan Note (Signed)
RA/fibromyalgia,  takes Prednisone, Tramadol, TSH 2.04 07/27/20

## 2022-01-26 NOTE — Assessment & Plan Note (Signed)
on Vit B12 po, Vit B12 235 06/20/21 

## 2022-01-26 NOTE — Assessment & Plan Note (Signed)
Hx of aute sigmoid diverticulitis with pneumoperitoneum, treated with  Omnicef/Falgyl 06/2020, f/u Dr. [name]

## 2022-01-26 NOTE — Assessment & Plan Note (Addendum)
takes Atorvastatin  

## 2022-01-26 NOTE — Assessment & Plan Note (Addendum)
DOE, on and off wheezing, cough, occasional yellow phlegm in am. CXR 10/14/21 no acute abnormality of the lungs. Echo EF 55-60/5 2017. BNP 46 10/17/21 01/26/22 left chest wall pain when pushing up self in bed, not associated with deep breathing, will CXR to r/o cardiopulmonary process.

## 2022-01-26 NOTE — Assessment & Plan Note (Signed)
Prevacid 30mg  qd, Hgb 13.5 02/03/21

## 2022-01-26 NOTE — Assessment & Plan Note (Signed)
DOE, Asthma Epinephrine prn, Allegra, Flonase, Singulair

## 2022-01-26 NOTE — Assessment & Plan Note (Signed)
Ambulates with walker, therapy to eval for ? W/c.

## 2022-01-31 ENCOUNTER — Other Ambulatory Visit: Payer: Medicare PPO

## 2022-01-31 ENCOUNTER — Other Ambulatory Visit: Payer: Self-pay

## 2022-01-31 DIAGNOSIS — Z79899 Other long term (current) drug therapy: Secondary | ICD-10-CM

## 2022-01-31 LAB — CBC WITH DIFFERENTIAL/PLATELET
Absolute Monocytes: 1240 cells/uL — ABNORMAL HIGH (ref 200–950)
Basophils Absolute: 80 cells/uL (ref 0–200)
Basophils Relative: 0.5 %
Eosinophils Absolute: 95 cells/uL (ref 15–500)
Eosinophils Relative: 0.6 %
HCT: 41.2 % (ref 35.0–45.0)
Hemoglobin: 13.8 g/dL (ref 11.7–15.5)
Lymphs Abs: 4865 cells/uL — ABNORMAL HIGH (ref 850–3900)
MCH: 32.7 pg (ref 27.0–33.0)
MCHC: 33.5 g/dL (ref 32.0–36.0)
MCV: 97.6 fL (ref 80.0–100.0)
MPV: 9.7 fL (ref 7.5–12.5)
Monocytes Relative: 7.8 %
Neutro Abs: 9620 cells/uL — ABNORMAL HIGH (ref 1500–7800)
Neutrophils Relative %: 60.5 %
Platelets: 309 10*3/uL (ref 140–400)
RBC: 4.22 10*6/uL (ref 3.80–5.10)
RDW: 13.1 % (ref 11.0–15.0)
Total Lymphocyte: 30.6 %
WBC: 15.9 10*3/uL — ABNORMAL HIGH (ref 3.8–10.8)

## 2022-01-31 LAB — COMPLETE METABOLIC PANEL WITH GFR
AG Ratio: 1.5 (calc) (ref 1.0–2.5)
ALT: 13 U/L (ref 6–29)
AST: 14 U/L (ref 10–35)
Albumin: 4.4 g/dL (ref 3.6–5.1)
Alkaline phosphatase (APISO): 57 U/L (ref 37–153)
BUN/Creatinine Ratio: 21 (calc) (ref 6–22)
BUN: 25 mg/dL (ref 7–25)
CO2: 29 mmol/L (ref 20–32)
Calcium: 9.8 mg/dL (ref 8.6–10.4)
Chloride: 102 mmol/L (ref 98–110)
Creat: 1.18 mg/dL — ABNORMAL HIGH (ref 0.60–0.95)
Globulin: 3 g/dL (calc) (ref 1.9–3.7)
Glucose, Bld: 83 mg/dL (ref 65–99)
Potassium: 3.9 mmol/L (ref 3.5–5.3)
Sodium: 143 mmol/L (ref 135–146)
Total Bilirubin: 0.6 mg/dL (ref 0.2–1.2)
Total Protein: 7.4 g/dL (ref 6.1–8.1)
eGFR: 45 mL/min/{1.73_m2} — ABNORMAL LOW (ref >=60)

## 2022-01-31 NOTE — Progress Notes (Signed)
Patient does not have a follow up visit scheduled with [place].  Please clarify if she has established care with a new provider or if her PCP has been prescribing prednisone?

## 2022-02-06 ENCOUNTER — Other Ambulatory Visit: Payer: Self-pay | Admitting: Nurse Practitioner

## 2022-02-06 ENCOUNTER — Other Ambulatory Visit: Payer: Self-pay | Admitting: Orthopedic Surgery

## 2022-02-06 DIAGNOSIS — E7800 Pure hypercholesterolemia, unspecified: Secondary | ICD-10-CM

## 2022-02-06 NOTE — Telephone Encounter (Signed)
Patient has request refill on medication "Prednisone 10mg ". Patient medication has Allergy Contraindications. Medication pend and sent to PCP Mast, [name], NP for approval.

## 2022-02-06 NOTE — Telephone Encounter (Signed)
Patient has requested refill on medication "Atorvastatin 20mg ". Patient last Lipid Panel dated 04/06/2020. Patient was scheduled for labs 01/31/2022. I don't see any resulted labs. Medication pend and sent to PCP Mast, [name], NP for approval.

## 2022-02-14 ENCOUNTER — Other Ambulatory Visit: Payer: Self-pay | Admitting: Internal Medicine

## 2022-02-14 DIAGNOSIS — J45909 Unspecified asthma, uncomplicated: Secondary | ICD-10-CM | POA: Diagnosis not present

## 2022-02-14 DIAGNOSIS — M069 Rheumatoid arthritis, unspecified: Secondary | ICD-10-CM | POA: Diagnosis not present

## 2022-02-14 DIAGNOSIS — R293 Abnormal posture: Secondary | ICD-10-CM | POA: Diagnosis not present

## 2022-02-14 DIAGNOSIS — M810 Age-related osteoporosis without current pathological fracture: Secondary | ICD-10-CM | POA: Diagnosis not present

## 2022-02-14 DIAGNOSIS — H1013 Acute atopic conjunctivitis, bilateral: Secondary | ICD-10-CM

## 2022-02-14 DIAGNOSIS — R29898 Other symptoms and signs involving the musculoskeletal system: Secondary | ICD-10-CM | POA: Diagnosis not present

## 2022-02-14 DIAGNOSIS — M6281 Muscle weakness (generalized): Secondary | ICD-10-CM | POA: Diagnosis not present

## 2022-02-14 DIAGNOSIS — M545 Low back pain, unspecified: Secondary | ICD-10-CM | POA: Diagnosis not present

## 2022-02-14 DIAGNOSIS — R0602 Shortness of breath: Secondary | ICD-10-CM | POA: Diagnosis not present

## 2022-02-14 DIAGNOSIS — I479 Paroxysmal tachycardia, unspecified: Secondary | ICD-10-CM | POA: Diagnosis not present

## 2022-02-14 DIAGNOSIS — J309 Allergic rhinitis, unspecified: Secondary | ICD-10-CM

## 2022-02-16 DIAGNOSIS — M545 Low back pain, unspecified: Secondary | ICD-10-CM | POA: Diagnosis not present

## 2022-02-16 DIAGNOSIS — R0602 Shortness of breath: Secondary | ICD-10-CM | POA: Diagnosis not present

## 2022-02-16 DIAGNOSIS — R293 Abnormal posture: Secondary | ICD-10-CM | POA: Diagnosis not present

## 2022-02-16 DIAGNOSIS — M6281 Muscle weakness (generalized): Secondary | ICD-10-CM | POA: Diagnosis not present

## 2022-02-16 DIAGNOSIS — R29898 Other symptoms and signs involving the musculoskeletal system: Secondary | ICD-10-CM | POA: Diagnosis not present

## 2022-02-16 DIAGNOSIS — M069 Rheumatoid arthritis, unspecified: Secondary | ICD-10-CM | POA: Diagnosis not present

## 2022-02-16 DIAGNOSIS — M810 Age-related osteoporosis without current pathological fracture: Secondary | ICD-10-CM | POA: Diagnosis not present

## 2022-02-16 DIAGNOSIS — I479 Paroxysmal tachycardia, unspecified: Secondary | ICD-10-CM | POA: Diagnosis not present

## 2022-02-16 DIAGNOSIS — J45909 Unspecified asthma, uncomplicated: Secondary | ICD-10-CM | POA: Diagnosis not present

## 2022-02-20 DIAGNOSIS — R29898 Other symptoms and signs involving the musculoskeletal system: Secondary | ICD-10-CM | POA: Diagnosis not present

## 2022-02-20 DIAGNOSIS — M6281 Muscle weakness (generalized): Secondary | ICD-10-CM | POA: Diagnosis not present

## 2022-02-20 DIAGNOSIS — I479 Paroxysmal tachycardia, unspecified: Secondary | ICD-10-CM | POA: Diagnosis not present

## 2022-02-20 DIAGNOSIS — J45909 Unspecified asthma, uncomplicated: Secondary | ICD-10-CM | POA: Diagnosis not present

## 2022-02-20 DIAGNOSIS — M069 Rheumatoid arthritis, unspecified: Secondary | ICD-10-CM | POA: Diagnosis not present

## 2022-02-20 DIAGNOSIS — R293 Abnormal posture: Secondary | ICD-10-CM | POA: Diagnosis not present

## 2022-02-20 DIAGNOSIS — R0602 Shortness of breath: Secondary | ICD-10-CM | POA: Diagnosis not present

## 2022-02-20 DIAGNOSIS — M545 Low back pain, unspecified: Secondary | ICD-10-CM | POA: Diagnosis not present

## 2022-02-20 DIAGNOSIS — M810 Age-related osteoporosis without current pathological fracture: Secondary | ICD-10-CM | POA: Diagnosis not present

## 2022-02-22 DIAGNOSIS — I479 Paroxysmal tachycardia, unspecified: Secondary | ICD-10-CM | POA: Diagnosis not present

## 2022-02-22 DIAGNOSIS — R293 Abnormal posture: Secondary | ICD-10-CM | POA: Diagnosis not present

## 2022-02-22 DIAGNOSIS — R0602 Shortness of breath: Secondary | ICD-10-CM | POA: Diagnosis not present

## 2022-02-22 DIAGNOSIS — M545 Low back pain, unspecified: Secondary | ICD-10-CM | POA: Diagnosis not present

## 2022-02-22 DIAGNOSIS — R29898 Other symptoms and signs involving the musculoskeletal system: Secondary | ICD-10-CM | POA: Diagnosis not present

## 2022-02-22 DIAGNOSIS — M069 Rheumatoid arthritis, unspecified: Secondary | ICD-10-CM | POA: Diagnosis not present

## 2022-02-22 DIAGNOSIS — M6281 Muscle weakness (generalized): Secondary | ICD-10-CM | POA: Diagnosis not present

## 2022-02-22 DIAGNOSIS — J45909 Unspecified asthma, uncomplicated: Secondary | ICD-10-CM | POA: Diagnosis not present

## 2022-02-22 DIAGNOSIS — M810 Age-related osteoporosis without current pathological fracture: Secondary | ICD-10-CM | POA: Diagnosis not present

## 2022-02-24 DIAGNOSIS — J45909 Unspecified asthma, uncomplicated: Secondary | ICD-10-CM | POA: Diagnosis not present

## 2022-02-24 DIAGNOSIS — M545 Low back pain, unspecified: Secondary | ICD-10-CM | POA: Diagnosis not present

## 2022-02-24 DIAGNOSIS — M810 Age-related osteoporosis without current pathological fracture: Secondary | ICD-10-CM | POA: Diagnosis not present

## 2022-02-24 DIAGNOSIS — M6281 Muscle weakness (generalized): Secondary | ICD-10-CM | POA: Diagnosis not present

## 2022-02-24 DIAGNOSIS — R0602 Shortness of breath: Secondary | ICD-10-CM | POA: Diagnosis not present

## 2022-02-24 DIAGNOSIS — I479 Paroxysmal tachycardia, unspecified: Secondary | ICD-10-CM | POA: Diagnosis not present

## 2022-02-24 DIAGNOSIS — M069 Rheumatoid arthritis, unspecified: Secondary | ICD-10-CM | POA: Diagnosis not present

## 2022-02-24 DIAGNOSIS — R293 Abnormal posture: Secondary | ICD-10-CM | POA: Diagnosis not present

## 2022-02-24 DIAGNOSIS — R29898 Other symptoms and signs involving the musculoskeletal system: Secondary | ICD-10-CM | POA: Diagnosis not present

## 2022-02-26 NOTE — Progress Notes (Signed)
?Cardiology Office Note:   ?Date:  02/27/2022  ?NAME:  Eileen Wilkerson    ?MRN: [id number] ?DOB:  07-27-1932  ? ?PCP:  Mast, [name], NP  ?Cardiologist:  None  ?Electrophysiologist:  None  ? ?Referring MD: Mast, [name], NP  ? ?Chief Complaint  ?Patient presents with  ? Shortness of Breath  ?   ?  ? ? ?History of Present Illness:   ?Eileen Wilkerson is a 88 y.o. female with a hx of asthma, fibromyalgia, RA/OA who is being seen today for the evaluation of sinus tachycardia at the request of Mast, [name], NP.  She reports for the last 1 year she has had shortness of breath with activity as well as swelling in her legs.  She takes 80 mg of Lasix daily.  She is also had tachycardia.  Heart rate is 100 today.  Per review of records tachycardia does not appear to be new.  She needs an updated TSH.  She denies any chest pain or pressure.  [facility]  She reports she is an active.  Her EKG does demonstrate a right bundle branch block and left anterior fascicular block.  She does suffer from rheumatoid arthritis.  Apparently she can only take prednisone.  She is on 10 mg daily.  She is a chronically elevated white count which I suspect is related to prednisone.  Kidney function is normal.  Cardiovascular examination is really unremarkable.  She had a chest x-ray in November that was normal.  No fevers or chills.  No cough or congestion.  She informs me that she does have swelling that is worse at the end of the day.  Apparently this improves in the morning.  She tries to watch salt intake at her independent living.  Oxygen saturation 97%.  No unilateral swelling.  Weights are stable.  Again cardiovascular exam really unremarkable. ? ?Needs TSH ?HGB 13.8 ?WBC 15.9 ? ?Past Medical History: ?Past Medical History:  ?Diagnosis Date  ? Adrenal failure ([place])   ? Arthritis   ? Asthma   ? Cancer [facility])   ? Cataract   ? Closed nondisplaced fracture of fifth right metatarsal bone 09/18/2017  ?  Diverticulitis   ? Per patient  ? Fibromyalgia 2008  ? HCAP (healthcare-associated pneumonia) 02/03/2018  ? Osteoporosis   ? RA (rheumatoid arthritis) ([place])   ? Recurrent upper respiratory infection (URI)   ? Sepsis due to urinary tract infection ([place]) 01/18/2018  ? Urticaria   ? ? ?Past Surgical History: ?Past Surgical History:  ?Procedure Laterality Date  ? BACK SURGERY    ? BILATERAL CARPAL TUNNEL RELEASE  2005  ? right and left  ? CATARACT EXTRACTION, BILATERAL  2004  ? right and left  ? CERVICAL FUSION  2011,2010,2008  ? 2 disks  ? HEEL SPUR SURGERY  2004  ? lower back fusion  2011  ? Fusion of 3-4 and 4-5 lower back  ? [place]  ? ROTATOR CUFF REPAIR  [id number]  ? SQUAMOUS CELL CARCINOMA EXCISION    ? TONSILLECTOMY AND ADENOIDECTOMY  1947  ? TOTAL SHOULDER ARTHROPLASTY    ? ? ?Current Medications: ?Current Meds  ?Medication Sig  ? acetaminophen (TYLENOL) 500 MG tablet Take 500 mg by mouth every 6 (six) hours.   ? atorvastatin (LIPITOR) 20 MG tablet TAKE 1 TABLET BY MOUTH EVERY DAY  ? Biotin 10 MG TABS Take 10 mg by mouth daily.  ? Cholecalciferol (  VITAMIN D3) 5000 units CAPS Take 5,000 Units by mouth daily.  ? CRANBERRY PO Take 1 capsule by mouth daily.  ? cycloSPORINE (RESTASIS) 0.05 % ophthalmic emulsion Place 1 drop into both eyes 2 (two) times daily.   ? diclofenac Sodium (VOLTAREN) 1 % GEL APPLY 2 TO 4 GRAMS TOPICALLY TO AFFECTED JOINTS UP TO 4 TIMES DAILY  ? EPINEPHrine 0.3 mg/0.3 mL IJ SOAJ injection Inject 0.3 mg into the muscle as needed for anaphylaxis.   ? fexofenadine (ALLEGRA) 180 MG tablet Take 180 mg by mouth daily.  ? fluticasone (FLONASE) 50 MCG/ACT nasal spray SPRAY 2 SPRAYS INTO EACH NOSTRIL EVERY DAY  ? furosemide (LASIX) 80 MG tablet Take 1 tablet by mouth once daily  ? ketoconazole (NIZORAL) 2 % shampoo Apply 1 application topically once a week.   ? lansoprazole (PREVACID) 30 MG capsule TAKE 1 CAPSULE BY MOUTH ONCE DAILY AT  12  NOON  ? MELATONIN PO Take 10 mg by mouth  daily.   ? montelukast (SINGULAIR) 10 MG tablet TAKE 1 TABLET BY MOUTH AT BEDTIME  ? potassium chloride (KLOR-CON) 10 MEQ tablet TAKE 2  BY MOUTH TWICE DAILY  ? predniSONE (DELTASONE) 10 MG tablet Take 1 tablet by mouth once daily with breakfast  ? Syringe/Needle, Disp, (SYRINGE 3CC/25GX1") 25G X 1" 3 ML MISC 1 application by Does not apply route every 30 (thirty) days.  ? traMADol (ULTRAM) 50 MG tablet TAKE 1 TABLET BY MOUTH 4 TIMES DAILY.  ? vitamin B-12 (CYANOCOBALAMIN) 1000 MCG tablet Take 1 tablet (1,000 mcg total) by mouth daily.  ?  ? ?Allergies:    ?Adhesive [tape], Celebrex [celecoxib], Ciprofibrate, Cymbalta [duloxetine hcl], Gabitril [tiagabine], Keflex [cephalexin], Lyrica [pregabalin], Neurontin [gabapentin], Nexium [esomeprazole], Nsaids, Penicillins, Shrimp [shellfish allergy], Sulfa antibiotics, Azactam [aztreonam], Azelastine hcl, Ciprofloxacin, Claritin [loratadine], Methotrexate derivatives, Nasacort [triamcinolone], Olopatadine, Other, Sulfamethizole, Zantac [ranitidine hcl], and Claritin-d 12 hour [loratadine-pseudoephedrine er]  ? ?Social History: ?Social History  ? ?Socioeconomic History  ? Marital status: Widowed  ?  Spouse name: Not on file  ? Number of children: 2  ? Years of education: Not on file  ? Highest education level: Not on file  ?Occupational History  ? Occupation: Retired Education officer, museum  ?Tobacco Use  ? Smoking status: Former  ?  Packs/day: 0.75  ?  Years: 10.00  ?  Pack years: 7.50  ?  Types: Cigarettes  ?  Quit date: 1968  ?  Years since quitting: 55.2  ? Smokeless tobacco: Never  ?Vaping Use  ? Vaping Use: Never used  ?Substance and Sexual Activity  ? Alcohol use: No  ? Drug use: No  ? Sexual activity: Not Currently  ?Other Topics Concern  ? Not on file  ?Social History Narrative  ?   ? Diet:    ?   ? Do you drink/ eat things with caffeine? Dr. Malachi Bonds 2/ day  ?   ? Marital status: Widowed                              What year were you married ? 1953  ?   ? Do you live in a  house, apartment,assistred living, condo, trailer, etc.)? Apartment  ?   ? Is it one or more stories?   ?   ? How many persons live in your home ? 1  ?   ? Do you have any pets in your home ?(please list) No  ?   ?  Highest Level of education completed: PhD   ?   ? Current or past profession: Transport planner, Education officer, museum, Therapist, art   ?   ? Do you exercise?   No                           Type & how often   ?   ? ADVANCED DIRECTIVES (Please bring copies)  ?   ? Do you have a living will? Yes  ?   ? Do you have a DNR form?                       If not, do you want to discuss one? Yes  ?   ? Do you have signed POA?HPOA forms?                 If so, please bring to your appointment Tes  ?   ? FUNCTIONAL STATUS- To be completed by Spouse / child / Staff   ?   ? Do you have difficulty bathing or dressing yourself ? No  ?   ? Do you have difficulty preparing food or eating ? No  ?   ? Do you have difficulty managing your mediation ? No  ?   ? Do you have difficulty managing your finances ? No  ?   ? Do you have difficulty affording your medication ? No  ?   ? ?Social Determinants of Health  ? ?Financial Resource Strain: Not on file  ?Food Insecurity: Not on file  ?Transportation Needs: Not on file  ?Physical Activity: Not on file  ?Stress: Not on file  ?Social Connections: Not on file  ?  ? ?Family History: ?The patient's family history includes Breast cancer (age of onset: 43) in her mother; Congestive Heart Failure (age of onset: 33) in her father; Diabetes in her father; Heart attack in her maternal grandmother and paternal grandfather; Heart disease in her father. There is no history of Allergic rhinitis, Asthma, Eczema, or Urticaria. ? ?ROS:   ?All other ROS reviewed and negative. Pertinent positives noted in the HPI.    ? ?EKGs/Labs/Other Studies Reviewed:   ?The following studies were personally reviewed by me today: ? ?EKG:  EKG is ordered today.  The ekg ordered today demonstrates sinus tachycardia heart rate  100, right bundle branch block, left anterior fascicular block, and was personally reviewed by me.  ? ?Recent Labs: ?10/17/2021: Brain Natriuretic Peptide 46 ?01/31/2022: ALT 13; BUN 25; Creat 1.18; Hemoglobin 13.8;

## 2022-02-27 ENCOUNTER — Ambulatory Visit
Admission: RE | Admit: 2022-02-27 | Discharge: 2022-02-27 | Disposition: A | Discharge status: Home / Self Care | Payer: Medicare PPO | Source: Ambulatory Visit | Attending: Cardiovascular Disease | Admitting: Cardiovascular Disease

## 2022-02-27 ENCOUNTER — Ambulatory Visit: Payer: Medicare PPO | Admitting: Cardiovascular Disease

## 2022-02-27 ENCOUNTER — Encounter: Payer: Self-pay | Admitting: Cardiovascular Disease

## 2022-02-27 ENCOUNTER — Other Ambulatory Visit: Payer: Self-pay | Admitting: Internal Medicine

## 2022-02-27 ENCOUNTER — Other Ambulatory Visit: Payer: Self-pay

## 2022-02-27 VITALS — BP 124/76 | HR 100 | Ht 62.5 in | Wt 182.4 lb

## 2022-02-27 DIAGNOSIS — R0602 Shortness of breath: Secondary | ICD-10-CM

## 2022-02-27 DIAGNOSIS — M069 Rheumatoid arthritis, unspecified: Secondary | ICD-10-CM | POA: Diagnosis not present

## 2022-02-27 DIAGNOSIS — R000 Tachycardia, unspecified: Secondary | ICD-10-CM

## 2022-02-27 DIAGNOSIS — J45909 Unspecified asthma, uncomplicated: Secondary | ICD-10-CM | POA: Diagnosis not present

## 2022-02-27 DIAGNOSIS — I444 Left anterior fascicular block: Secondary | ICD-10-CM | POA: Diagnosis not present

## 2022-02-27 DIAGNOSIS — R062 Wheezing: Secondary | ICD-10-CM | POA: Diagnosis not present

## 2022-02-27 DIAGNOSIS — R29898 Other symptoms and signs involving the musculoskeletal system: Secondary | ICD-10-CM | POA: Diagnosis not present

## 2022-02-27 DIAGNOSIS — I479 Paroxysmal tachycardia, unspecified: Secondary | ICD-10-CM | POA: Diagnosis not present

## 2022-02-27 DIAGNOSIS — I4510 Unspecified right bundle-branch block: Secondary | ICD-10-CM

## 2022-02-27 DIAGNOSIS — M810 Age-related osteoporosis without current pathological fracture: Secondary | ICD-10-CM | POA: Diagnosis not present

## 2022-02-27 DIAGNOSIS — R600 Localized edema: Secondary | ICD-10-CM

## 2022-02-27 DIAGNOSIS — M545 Low back pain, unspecified: Secondary | ICD-10-CM | POA: Diagnosis not present

## 2022-02-27 DIAGNOSIS — H1013 Acute atopic conjunctivitis, bilateral: Secondary | ICD-10-CM

## 2022-02-27 DIAGNOSIS — R293 Abnormal posture: Secondary | ICD-10-CM | POA: Diagnosis not present

## 2022-02-27 DIAGNOSIS — M6281 Muscle weakness (generalized): Secondary | ICD-10-CM | POA: Diagnosis not present

## 2022-02-27 NOTE — Patient Instructions (Signed)
Medication Instructions:  ?The current medical regimen is effective;  continue present plan and medications. ? ?*If you need a refill on your cardiac medications before your next appointment, please call your pharmacy* ? ? ?Lab Work: ?BNP, TSH today  ? ?If you have labs (blood work) drawn today and your tests are completely normal, you will receive your results only by: ?MyChart Message (if you have MyChart) OR ?A paper copy in the mail ?If you have any lab test that is abnormal or we need to change your treatment, we will call you to review the results. ? ? ?Testing/Procedures: ?Chest xray - Your physician has requested that you have a chest xray, is a fast and painless imaging test that uses certain electromagnetic waves to create pictures of the structures in and around your chest. This test can help diagnose and monitor conditions such as pneumonia and other lung issues his will be done at [facility], [place]. If you should need to call them their phone number is [phone].  ? ?Echocardiogram - Your physician has requested that you have an echocardiogram. Echocardiography is a painless test that uses sound waves to create images of your heart. It provides your doctor with information about the size and shape of your heart and how well your heart?s chambers and valves are working. This procedure takes approximately one hour. There are no restrictions for this procedure. This will be performed at either our [facility] location - [address] [place] location [facility] 2nd floor. ? ? ? ?Follow-Up: ?At [facility], you and your health needs are our priority.  As part of our continuing mission to provide you with exceptional heart care, we have created designated Provider Care Teams.  These Care Teams include your primary Cardiologist (physician) and Advanced Practice Providers (APPs -  Physician Assistants and Nurse Practitioners) who all work  together to provide you with the care you need, when you need it. ? ?We recommend signing up for the patient portal called "MyChart".  Sign up information is provided on this After Visit Summary.  MyChart is used to connect with patients for Virtual Visits (Telemedicine).  Patients are able to view lab/test results, encounter notes, upcoming appointments, etc.  Non-urgent messages can be sent to your provider as well.   ?To learn more about what you can do with MyChart, go to [email or web address].   ? ?Your next appointment:   ?4 month(s) ? ?The format for your next appointment:   ?In Person ? ?Provider:   ?Eleonore Chiquito, MD  ? ? ?Other Instructions ? ?How to Use Compression Stockings ?Compression stockings are elastic socks that help increase blood flow (circulation) to the legs, decrease swelling in the legs, and reduce the chance of developing blood clots in the lower legs. Compression stockings squeeze or apply pressure to the legs. The stockings are graduated, meaning the highest amount of pressure occurs at the toes and it decreases going toward the upper part of the leg. This helps ensure proper circulation through the veins. ?Compression stockings are often used by people who: ?Are recovering from surgery. The stockings help prevent blood clots after surgery. ?Have poor circulation or swelling in their legs because of a medical condition, such as chronic venous insufficiency, venous stasis, or lymphedema. ?Have a history of getting blood clots in their legs. ?Have bulging (varicose) veins. ?Sit or stay in bed for long periods of time (immobilization). ?Stand for long periods of time  and experience leg pain or fatigue. ?Follow instructions from your health care provider about how and when to wear your compression stockings. ?What are the risks? ?Generally, compression stockings are safe to wear. However, problems may occur for some people, such as: ?The stockings being ineffective at increasing the  circulation to the legs, decreasing swelling in the legs, or reducing the chance of developing blood clots in the lower legs. ?Skin complications, including breaks in the skin, open wounds, blisters, or dermatitis. ?How to wear compression stockings ?Before you put on your compression stockings: ?Make sure that they are the correct size and degree of compression. If you do not know your size or required grade of compression, ask your health care provider and follow the manufacturer's instructions that come with the stockings. ?Be sure they are the appropriate length for your medical needs. Compression stockings come in different lengths, including knee high, thigh high, and even up to the waist. ?Make sure that the stockings are clean, dry, and in good condition. ?Check the stockings for rips and tears. Do not put them on if they are ripped or torn. ?Put your stockings on first thing in the morning, before you get out of bed. Keep them on for as long as your health care provider advises. Most people are told to remove their compression stockings at the end of the day before bed. ?When you are wearing your stockings: ?Keep them as smooth as possible. Do not allow them to bunch up. It is especially important to prevent the stockings from bunching up around your toes or behind your knees. ?Make sure that the toe holes are underneath the toes and the heel patches are positioned at the heels. ?Do not roll the stockings downward and leave them rolled down. This can decrease blood flow to your legs. ?Change the stockings right away if they become wet or dirty or if they have a bad smell. ?If you have chronic leg wounds, make sure the wounds are properly covered or dressed before putting on your compression stockings. ?When you take off your stockings, check your legs and feet for: ?Open sores. ?Red spots or other areas of discoloration. ?Swelling. ?General tips ?Do not stop wearing compression stockings. Talk to your  health care provider if your stockings feel too tight. ?Wash your stockings often with mild detergent in cold or warm water. Also wash them whenever they get dirty or have a bad smell. Do not use bleach. Air-dry your stockings or dry them in a clothes dryer on low heat. It may be helpful to have two pairs so that you have a pair to wear while the other is being washed. ?Replace your stockings every 3-6 months. ?If skin moisturizing is part of your treatment plan, apply lotion or cream at night so that your skin will be dry when you put on the stockings in the morning. It is harder to put the stockings on when you have lotion on your legs or feet. ?Wear nonskid shoes or slip-resistant socks when walking while wearing compression stockings. ?If you have difficulty putting on or taking off the compression stockings, ask your health care provider about devices that may help make this easier. ?Contact a health care provider and remove your stockings if: ?You have a prickling or tingling feeling in your feet or legs. ?You have new open sores, red spots, or other skin changes on your feet or legs. ?You have swelling or pain that gets worse. ?Get help right away if: ?You have  shortness of breath or chest pain. ?Your heartbeat is fast or irregular. ?You have new swelling, pain, or warmth in your leg. ?You have numbness or tingling in your lower legs that does not get better after you take the stockings off. ?Your toes or feet are unusually cold or turn a bluish color. ?You feel light-headed or dizzy. ?These symptoms may represent a serious problem that is an emergency. Do not wait to see if the symptoms will go away. Get medical help right away. Call your local emergency services (911 in the [place] Do not drive yourself to the hospital. ?Summary ?Compression stockings are elastic socks that are worn to treat a variety of symptoms and medical conditions such as venous insufficiency, venous stasis, or lymphedema. ?Compression  stockings help increase blood flow (circulation) to the legs, decrease swelling in the legs, and reduce the chance of developing blood clots in the lower legs. ?Follow instructions from your health care provider abo

## 2022-02-28 LAB — BRAIN NATRIURETIC PEPTIDE: BNP: 33.7 pg/mL (ref 0.0–100.0)

## 2022-02-28 LAB — TSH: TSH: 0.865 u[IU]/mL (ref 0.450–4.500)

## 2022-03-01 DIAGNOSIS — I479 Paroxysmal tachycardia, unspecified: Secondary | ICD-10-CM | POA: Diagnosis not present

## 2022-03-01 DIAGNOSIS — M069 Rheumatoid arthritis, unspecified: Secondary | ICD-10-CM | POA: Diagnosis not present

## 2022-03-01 DIAGNOSIS — M545 Low back pain, unspecified: Secondary | ICD-10-CM | POA: Diagnosis not present

## 2022-03-01 DIAGNOSIS — M810 Age-related osteoporosis without current pathological fracture: Secondary | ICD-10-CM | POA: Diagnosis not present

## 2022-03-01 DIAGNOSIS — R29898 Other symptoms and signs involving the musculoskeletal system: Secondary | ICD-10-CM | POA: Diagnosis not present

## 2022-03-01 DIAGNOSIS — J45909 Unspecified asthma, uncomplicated: Secondary | ICD-10-CM | POA: Diagnosis not present

## 2022-03-01 DIAGNOSIS — M6281 Muscle weakness (generalized): Secondary | ICD-10-CM | POA: Diagnosis not present

## 2022-03-01 DIAGNOSIS — R0602 Shortness of breath: Secondary | ICD-10-CM | POA: Diagnosis not present

## 2022-03-01 DIAGNOSIS — R293 Abnormal posture: Secondary | ICD-10-CM | POA: Diagnosis not present

## 2022-03-03 DIAGNOSIS — I479 Paroxysmal tachycardia, unspecified: Secondary | ICD-10-CM | POA: Diagnosis not present

## 2022-03-03 DIAGNOSIS — R0602 Shortness of breath: Secondary | ICD-10-CM | POA: Diagnosis not present

## 2022-03-03 DIAGNOSIS — R293 Abnormal posture: Secondary | ICD-10-CM | POA: Diagnosis not present

## 2022-03-03 DIAGNOSIS — M810 Age-related osteoporosis without current pathological fracture: Secondary | ICD-10-CM | POA: Diagnosis not present

## 2022-03-03 DIAGNOSIS — R29898 Other symptoms and signs involving the musculoskeletal system: Secondary | ICD-10-CM | POA: Diagnosis not present

## 2022-03-03 DIAGNOSIS — M6281 Muscle weakness (generalized): Secondary | ICD-10-CM | POA: Diagnosis not present

## 2022-03-03 DIAGNOSIS — J45909 Unspecified asthma, uncomplicated: Secondary | ICD-10-CM | POA: Diagnosis not present

## 2022-03-03 DIAGNOSIS — M545 Low back pain, unspecified: Secondary | ICD-10-CM | POA: Diagnosis not present

## 2022-03-03 DIAGNOSIS — M069 Rheumatoid arthritis, unspecified: Secondary | ICD-10-CM | POA: Diagnosis not present

## 2022-03-06 DIAGNOSIS — I479 Paroxysmal tachycardia, unspecified: Secondary | ICD-10-CM | POA: Diagnosis not present

## 2022-03-06 DIAGNOSIS — M069 Rheumatoid arthritis, unspecified: Secondary | ICD-10-CM | POA: Diagnosis not present

## 2022-03-06 DIAGNOSIS — R293 Abnormal posture: Secondary | ICD-10-CM | POA: Diagnosis not present

## 2022-03-06 DIAGNOSIS — R29898 Other symptoms and signs involving the musculoskeletal system: Secondary | ICD-10-CM | POA: Diagnosis not present

## 2022-03-06 DIAGNOSIS — J45909 Unspecified asthma, uncomplicated: Secondary | ICD-10-CM | POA: Diagnosis not present

## 2022-03-06 DIAGNOSIS — M545 Low back pain, unspecified: Secondary | ICD-10-CM | POA: Diagnosis not present

## 2022-03-06 DIAGNOSIS — R0602 Shortness of breath: Secondary | ICD-10-CM | POA: Diagnosis not present

## 2022-03-06 DIAGNOSIS — M6281 Muscle weakness (generalized): Secondary | ICD-10-CM | POA: Diagnosis not present

## 2022-03-06 DIAGNOSIS — M810 Age-related osteoporosis without current pathological fracture: Secondary | ICD-10-CM | POA: Diagnosis not present

## 2022-03-08 ENCOUNTER — Ambulatory Visit (INDEPENDENT_AMBULATORY_CARE_PROVIDER_SITE_OTHER): Payer: Medicare PPO

## 2022-03-08 DIAGNOSIS — R0602 Shortness of breath: Secondary | ICD-10-CM | POA: Diagnosis not present

## 2022-03-08 DIAGNOSIS — R29898 Other symptoms and signs involving the musculoskeletal system: Secondary | ICD-10-CM | POA: Diagnosis not present

## 2022-03-08 DIAGNOSIS — M810 Age-related osteoporosis without current pathological fracture: Secondary | ICD-10-CM | POA: Diagnosis not present

## 2022-03-08 DIAGNOSIS — M069 Rheumatoid arthritis, unspecified: Secondary | ICD-10-CM | POA: Diagnosis not present

## 2022-03-08 DIAGNOSIS — M6281 Muscle weakness (generalized): Secondary | ICD-10-CM | POA: Diagnosis not present

## 2022-03-08 DIAGNOSIS — J45909 Unspecified asthma, uncomplicated: Secondary | ICD-10-CM | POA: Diagnosis not present

## 2022-03-08 DIAGNOSIS — R293 Abnormal posture: Secondary | ICD-10-CM | POA: Diagnosis not present

## 2022-03-08 DIAGNOSIS — M545 Low back pain, unspecified: Secondary | ICD-10-CM | POA: Diagnosis not present

## 2022-03-08 DIAGNOSIS — I479 Paroxysmal tachycardia, unspecified: Secondary | ICD-10-CM | POA: Diagnosis not present

## 2022-03-08 LAB — ECHOCARDIOGRAM COMPLETE
AR max vel: 2.11 cm2
AV Area VTI: 2 cm2
AV Area mean vel: 2.05 cm2
AV Mean grad: 5 mmHg
AV Peak grad: 8.8 mmHg
Ao pk vel: 1.48 m/s
Area-P 1/2: 3.65 cm2
S' Lateral: 1.97 cm

## 2022-03-08 MED ORDER — PERFLUTREN LIPID MICROSPHERE
1.0000 mL | INTRAVENOUS | Status: AC | PRN
  Administered 2022-03-08: 2 mL via INTRAVENOUS

## 2022-03-10 DIAGNOSIS — M810 Age-related osteoporosis without current pathological fracture: Secondary | ICD-10-CM | POA: Diagnosis not present

## 2022-03-10 DIAGNOSIS — M545 Low back pain, unspecified: Secondary | ICD-10-CM | POA: Diagnosis not present

## 2022-03-10 DIAGNOSIS — R29898 Other symptoms and signs involving the musculoskeletal system: Secondary | ICD-10-CM | POA: Diagnosis not present

## 2022-03-10 DIAGNOSIS — M6281 Muscle weakness (generalized): Secondary | ICD-10-CM | POA: Diagnosis not present

## 2022-03-10 DIAGNOSIS — R0602 Shortness of breath: Secondary | ICD-10-CM | POA: Diagnosis not present

## 2022-03-10 DIAGNOSIS — R293 Abnormal posture: Secondary | ICD-10-CM | POA: Diagnosis not present

## 2022-03-10 DIAGNOSIS — J45909 Unspecified asthma, uncomplicated: Secondary | ICD-10-CM | POA: Diagnosis not present

## 2022-03-10 DIAGNOSIS — I479 Paroxysmal tachycardia, unspecified: Secondary | ICD-10-CM | POA: Diagnosis not present

## 2022-03-10 DIAGNOSIS — M069 Rheumatoid arthritis, unspecified: Secondary | ICD-10-CM | POA: Diagnosis not present

## 2022-03-13 ENCOUNTER — Other Ambulatory Visit: Payer: Self-pay | Admitting: Nurse Practitioner

## 2022-03-13 DIAGNOSIS — M069 Rheumatoid arthritis, unspecified: Secondary | ICD-10-CM | POA: Diagnosis not present

## 2022-03-13 DIAGNOSIS — M6281 Muscle weakness (generalized): Secondary | ICD-10-CM | POA: Diagnosis not present

## 2022-03-13 DIAGNOSIS — J45909 Unspecified asthma, uncomplicated: Secondary | ICD-10-CM | POA: Diagnosis not present

## 2022-03-13 DIAGNOSIS — R293 Abnormal posture: Secondary | ICD-10-CM | POA: Diagnosis not present

## 2022-03-13 DIAGNOSIS — M810 Age-related osteoporosis without current pathological fracture: Secondary | ICD-10-CM | POA: Diagnosis not present

## 2022-03-13 DIAGNOSIS — K219 Gastro-esophageal reflux disease without esophagitis: Secondary | ICD-10-CM

## 2022-03-13 DIAGNOSIS — R0602 Shortness of breath: Secondary | ICD-10-CM | POA: Diagnosis not present

## 2022-03-13 DIAGNOSIS — I479 Paroxysmal tachycardia, unspecified: Secondary | ICD-10-CM | POA: Diagnosis not present

## 2022-03-13 DIAGNOSIS — R29898 Other symptoms and signs involving the musculoskeletal system: Secondary | ICD-10-CM | POA: Diagnosis not present

## 2022-03-13 DIAGNOSIS — M545 Low back pain, unspecified: Secondary | ICD-10-CM | POA: Diagnosis not present

## 2022-03-13 NOTE — Telephone Encounter (Signed)
Very high allergy warning came up when trying to refill medication. Medication pended and sent to [name] Mast, NP for approval ?

## 2022-03-15 DIAGNOSIS — D485 Neoplasm of uncertain behavior of skin: Secondary | ICD-10-CM | POA: Diagnosis not present

## 2022-03-15 DIAGNOSIS — D0462 Carcinoma in situ of skin of left upper limb, including shoulder: Secondary | ICD-10-CM | POA: Diagnosis not present

## 2022-03-15 DIAGNOSIS — Z85828 Personal history of other malignant neoplasm of skin: Secondary | ICD-10-CM | POA: Diagnosis not present

## 2022-03-15 DIAGNOSIS — D0439 Carcinoma in situ of skin of other parts of face: Secondary | ICD-10-CM | POA: Diagnosis not present

## 2022-03-15 DIAGNOSIS — L820 Inflamed seborrheic keratosis: Secondary | ICD-10-CM | POA: Diagnosis not present

## 2022-03-17 DIAGNOSIS — M069 Rheumatoid arthritis, unspecified: Secondary | ICD-10-CM | POA: Diagnosis not present

## 2022-03-17 DIAGNOSIS — J45909 Unspecified asthma, uncomplicated: Secondary | ICD-10-CM | POA: Diagnosis not present

## 2022-03-17 DIAGNOSIS — R293 Abnormal posture: Secondary | ICD-10-CM | POA: Diagnosis not present

## 2022-03-17 DIAGNOSIS — M6281 Muscle weakness (generalized): Secondary | ICD-10-CM | POA: Diagnosis not present

## 2022-03-17 DIAGNOSIS — R29898 Other symptoms and signs involving the musculoskeletal system: Secondary | ICD-10-CM | POA: Diagnosis not present

## 2022-03-17 DIAGNOSIS — M545 Low back pain, unspecified: Secondary | ICD-10-CM | POA: Diagnosis not present

## 2022-03-17 DIAGNOSIS — R0602 Shortness of breath: Secondary | ICD-10-CM | POA: Diagnosis not present

## 2022-03-17 DIAGNOSIS — I479 Paroxysmal tachycardia, unspecified: Secondary | ICD-10-CM | POA: Diagnosis not present

## 2022-03-17 DIAGNOSIS — M810 Age-related osteoporosis without current pathological fracture: Secondary | ICD-10-CM | POA: Diagnosis not present

## 2022-03-20 DIAGNOSIS — M545 Low back pain, unspecified: Secondary | ICD-10-CM | POA: Diagnosis not present

## 2022-03-20 DIAGNOSIS — J45909 Unspecified asthma, uncomplicated: Secondary | ICD-10-CM | POA: Diagnosis not present

## 2022-03-20 DIAGNOSIS — I479 Paroxysmal tachycardia, unspecified: Secondary | ICD-10-CM | POA: Diagnosis not present

## 2022-03-20 DIAGNOSIS — R293 Abnormal posture: Secondary | ICD-10-CM | POA: Diagnosis not present

## 2022-03-20 DIAGNOSIS — R0602 Shortness of breath: Secondary | ICD-10-CM | POA: Diagnosis not present

## 2022-03-20 DIAGNOSIS — R29898 Other symptoms and signs involving the musculoskeletal system: Secondary | ICD-10-CM | POA: Diagnosis not present

## 2022-03-20 DIAGNOSIS — M810 Age-related osteoporosis without current pathological fracture: Secondary | ICD-10-CM | POA: Diagnosis not present

## 2022-03-20 DIAGNOSIS — M069 Rheumatoid arthritis, unspecified: Secondary | ICD-10-CM | POA: Diagnosis not present

## 2022-03-20 DIAGNOSIS — M6281 Muscle weakness (generalized): Secondary | ICD-10-CM | POA: Diagnosis not present

## 2022-03-22 DIAGNOSIS — R0602 Shortness of breath: Secondary | ICD-10-CM | POA: Diagnosis not present

## 2022-03-22 DIAGNOSIS — M069 Rheumatoid arthritis, unspecified: Secondary | ICD-10-CM | POA: Diagnosis not present

## 2022-03-22 DIAGNOSIS — M810 Age-related osteoporosis without current pathological fracture: Secondary | ICD-10-CM | POA: Diagnosis not present

## 2022-03-22 DIAGNOSIS — M6281 Muscle weakness (generalized): Secondary | ICD-10-CM | POA: Diagnosis not present

## 2022-03-22 DIAGNOSIS — R29898 Other symptoms and signs involving the musculoskeletal system: Secondary | ICD-10-CM | POA: Diagnosis not present

## 2022-03-22 DIAGNOSIS — I479 Paroxysmal tachycardia, unspecified: Secondary | ICD-10-CM | POA: Diagnosis not present

## 2022-03-22 DIAGNOSIS — R293 Abnormal posture: Secondary | ICD-10-CM | POA: Diagnosis not present

## 2022-03-22 DIAGNOSIS — M545 Low back pain, unspecified: Secondary | ICD-10-CM | POA: Diagnosis not present

## 2022-03-22 DIAGNOSIS — J45909 Unspecified asthma, uncomplicated: Secondary | ICD-10-CM | POA: Diagnosis not present

## 2022-03-24 DIAGNOSIS — R29898 Other symptoms and signs involving the musculoskeletal system: Secondary | ICD-10-CM | POA: Diagnosis not present

## 2022-03-24 DIAGNOSIS — M069 Rheumatoid arthritis, unspecified: Secondary | ICD-10-CM | POA: Diagnosis not present

## 2022-03-24 DIAGNOSIS — R0602 Shortness of breath: Secondary | ICD-10-CM | POA: Diagnosis not present

## 2022-03-24 DIAGNOSIS — M810 Age-related osteoporosis without current pathological fracture: Secondary | ICD-10-CM | POA: Diagnosis not present

## 2022-03-24 DIAGNOSIS — M6281 Muscle weakness (generalized): Secondary | ICD-10-CM | POA: Diagnosis not present

## 2022-03-24 DIAGNOSIS — J45909 Unspecified asthma, uncomplicated: Secondary | ICD-10-CM | POA: Diagnosis not present

## 2022-03-24 DIAGNOSIS — M545 Low back pain, unspecified: Secondary | ICD-10-CM | POA: Diagnosis not present

## 2022-03-24 DIAGNOSIS — R293 Abnormal posture: Secondary | ICD-10-CM | POA: Diagnosis not present

## 2022-03-24 DIAGNOSIS — I479 Paroxysmal tachycardia, unspecified: Secondary | ICD-10-CM | POA: Diagnosis not present

## 2022-03-28 DIAGNOSIS — H5213 Myopia, bilateral: Secondary | ICD-10-CM | POA: Diagnosis not present

## 2022-03-28 DIAGNOSIS — H35372 Puckering of macula, left eye: Secondary | ICD-10-CM | POA: Diagnosis not present

## 2022-03-28 DIAGNOSIS — H04123 Dry eye syndrome of bilateral lacrimal glands: Secondary | ICD-10-CM | POA: Diagnosis not present

## 2022-03-29 DIAGNOSIS — J45909 Unspecified asthma, uncomplicated: Secondary | ICD-10-CM | POA: Diagnosis not present

## 2022-03-29 DIAGNOSIS — M810 Age-related osteoporosis without current pathological fracture: Secondary | ICD-10-CM | POA: Diagnosis not present

## 2022-03-29 DIAGNOSIS — M545 Low back pain, unspecified: Secondary | ICD-10-CM | POA: Diagnosis not present

## 2022-03-29 DIAGNOSIS — I479 Paroxysmal tachycardia, unspecified: Secondary | ICD-10-CM | POA: Diagnosis not present

## 2022-03-29 DIAGNOSIS — M069 Rheumatoid arthritis, unspecified: Secondary | ICD-10-CM | POA: Diagnosis not present

## 2022-03-29 DIAGNOSIS — R293 Abnormal posture: Secondary | ICD-10-CM | POA: Diagnosis not present

## 2022-03-29 DIAGNOSIS — R0602 Shortness of breath: Secondary | ICD-10-CM | POA: Diagnosis not present

## 2022-03-29 DIAGNOSIS — M6281 Muscle weakness (generalized): Secondary | ICD-10-CM | POA: Diagnosis not present

## 2022-03-29 DIAGNOSIS — R29898 Other symptoms and signs involving the musculoskeletal system: Secondary | ICD-10-CM | POA: Diagnosis not present

## 2022-03-31 ENCOUNTER — Other Ambulatory Visit: Payer: Self-pay | Admitting: *Deleted

## 2022-03-31 MED ORDER — VITAMIN B-12 1000 MCG PO TABS: 1000.0000 ug | ORAL_TABLET | Freq: Every day | ORAL | 5 refills | Status: DC

## 2022-03-31 NOTE — Telephone Encounter (Signed)
[facility] requested refill.  ?

## 2022-04-05 DIAGNOSIS — M6281 Muscle weakness (generalized): Secondary | ICD-10-CM | POA: Diagnosis not present

## 2022-04-05 DIAGNOSIS — R29898 Other symptoms and signs involving the musculoskeletal system: Secondary | ICD-10-CM | POA: Diagnosis not present

## 2022-04-05 DIAGNOSIS — R0602 Shortness of breath: Secondary | ICD-10-CM | POA: Diagnosis not present

## 2022-04-05 DIAGNOSIS — M545 Low back pain, unspecified: Secondary | ICD-10-CM | POA: Diagnosis not present

## 2022-04-05 DIAGNOSIS — R293 Abnormal posture: Secondary | ICD-10-CM | POA: Diagnosis not present

## 2022-04-05 DIAGNOSIS — I479 Paroxysmal tachycardia, unspecified: Secondary | ICD-10-CM | POA: Diagnosis not present

## 2022-04-05 DIAGNOSIS — M069 Rheumatoid arthritis, unspecified: Secondary | ICD-10-CM | POA: Diagnosis not present

## 2022-04-05 DIAGNOSIS — J45909 Unspecified asthma, uncomplicated: Secondary | ICD-10-CM | POA: Diagnosis not present

## 2022-04-05 DIAGNOSIS — M810 Age-related osteoporosis without current pathological fracture: Secondary | ICD-10-CM | POA: Diagnosis not present

## 2022-04-10 ENCOUNTER — Other Ambulatory Visit: Payer: Self-pay | Admitting: Nurse Practitioner

## 2022-04-10 DIAGNOSIS — R600 Localized edema: Secondary | ICD-10-CM

## 2022-04-27 ENCOUNTER — Encounter: Payer: Medicare PPO | Admitting: Nurse Practitioner

## 2022-05-02 DIAGNOSIS — M48062 Spinal stenosis, lumbar region with neurogenic claudication: Secondary | ICD-10-CM | POA: Diagnosis not present

## 2022-05-03 ENCOUNTER — Non-Acute Institutional Stay: Payer: Medicare PPO | Admitting: Family Medicine

## 2022-05-03 ENCOUNTER — Encounter: Payer: Self-pay | Admitting: Family Medicine

## 2022-05-03 VITALS — BP 130/80 | HR 93 | Temp 96.9°F | Wt 180.6 lb

## 2022-05-03 DIAGNOSIS — R600 Localized edema: Secondary | ICD-10-CM

## 2022-05-03 DIAGNOSIS — E7800 Pure hypercholesterolemia, unspecified: Secondary | ICD-10-CM

## 2022-05-03 NOTE — Patient Instructions (Signed)
Take Atorvastatin on Monday, Wednesday and Friday only.

## 2022-05-03 NOTE — Progress Notes (Signed)
Provider:  Alain Honey, MD  Careteam: [name], [name], [name]  PLACE OF SERVICE:  [place]  Advanced Directive information    Allergies  Allergen Reactions   Adhesive [Tape] Rash   Celebrex [Celecoxib] Hives   Ciprofibrate Nausea Only   Cymbalta [Duloxetine Hcl] Swelling   Gabitril [Tiagabine] Swelling   Keflex [Cephalexin] Nausea And Vomiting   Lyrica [Pregabalin] Swelling   Neurontin [Gabapentin] Swelling   Nexium [Esomeprazole] Rash   Nsaids Rash   Penicillins Rash    Injection site reaction. Tolerated cefepime in past Has patient had a PCN reaction causing immediate rash, facial/tongue/throat swelling, SOB or lightheadedness with hypotension: No Has patient had a PCN reaction causing severe rash involving mucus membranes or skin necrosis: No Has patient had a PCN reaction that required hospitalization No Has patient had a PCN reaction occurring within the last 10 years: No If all of the above answers are "NO", then may proceed with Cephalosporin use.    Shrimp [Shellfish Allergy] Anaphylaxis    Per patient "shrimp only"   Sulfa Antibiotics Nausea And Vomiting   Azactam [Aztreonam]     Hand swelling    Azelastine Hcl     Rash    Ciprofloxacin Other (See Comments)    dizziness   Claritin [Loratadine]     Irritability Nervousness    Methotrexate Derivatives    Nasacort [Triamcinolone]     Dizzy    Olopatadine Other (See Comments)    Pain and lethargy    Other    Sulfamethizole Other (See Comments)    unknown   Zantac [Ranitidine Hcl] Other (See Comments)    unknown   Claritin-D 12 Hour [Loratadine-Pseudoephedrine Er] Anxiety    No chief complaint on file.    HPI: Patient is a 88 y.o. female this is a 22-monthfollow-up visit for shortness of breath and fatigue.  Since she was last seen she had a visit with cardiologist who performed an echocardiogram as well as be in PE both of which were  normal. She wakes up feeling tired rather than becoming tired as the day goes on.  She has been on prednisone for some years now having had issues with adrenal suppression secondary to steroid shots for spinal stenosis and lumbar disc disease.  She does take furosemide 80 mg a day for edema but also takes potassium.  She had some blood work done 2 months ago which showed normal sodium potassium thyroid hemoglobin. In reviewing her medicines she is on atorvastatin and has some muscle tenderness.  This could play a role in her muscle soreness and fatigue.  Review of Systems:  Review of Systems  Constitutional:  Positive for malaise/fatigue.  Respiratory: Negative.    Cardiovascular: Negative.   Musculoskeletal:  Positive for myalgias.  Neurological:  Positive for weakness.  Psychiatric/Behavioral:  Negative for depression.    Past Medical History:  Diagnosis Date   [place]    Arthritis    Asthma    [place]    Cataract    Closed nondisplaced fracture of fifth right metatarsal bone 09/18/2017   Diverticulitis    Per patient   Fibromyalgia 2008   HCAP (healthcare-associated pneumonia) 02/03/2018   Osteoporosis    RA (rheumatoid arthritis) (HCC)    Recurrent upper respiratory infection (URI)    [place] 01/18/2018   Urticaria    Past Surgical History:  Procedure Laterality  Date   BACK SURGERY     BILATERAL CARPAL TUNNEL RELEASE  2005   right and left   CATARACT EXTRACTION, BILATERAL  2004   right and left   CERVICAL FUSION  2011,2010,2008   2 disks   HEEL SPUR SURGERY  2004   lower back fusion  2011   Fusion of 3-4 and 4-5 lower back   RADIOFREQUENCY ABLATION  2020   ROTATOR CUFF REPAIR  [id number]   SQUAMOUS CELL CARCINOMA EXCISION     TONSILLECTOMY AND ADENOIDECTOMY  1947   TOTAL SHOULDER ARTHROPLASTY     Social History:   reports that she quit smoking about 55 years ago. Her smoking use included cigarettes. She has a  7.50 pack-year smoking history. She has never used smokeless tobacco. She reports that she does not drink alcohol and does not use drugs.  Family History  Problem Relation Age of Onset   Heart attack Maternal Grandmother    Heart attack Paternal Grandfather    Breast cancer Mother 66   Diabetes Father    Heart disease Father    Congestive Heart Failure Father 19       Died from   Allergic rhinitis Neg Hx    Asthma Neg Hx    Eczema Neg Hx    Urticaria Neg Hx     Medications: Patient's Medications  New Prescriptions   No medications on file  Previous Medications   ACETAMINOPHEN (TYLENOL) 500 MG TABLET    Take 500 mg by mouth every 6 (six) hours.    ATORVASTATIN (LIPITOR) 20 MG TABLET    TAKE 1 TABLET BY MOUTH EVERY DAY   BIOTIN 10 MG TABS    Take 10 mg by mouth daily.   CHOLECALCIFEROL (VITAMIN D3) 5000 UNITS CAPS    Take 5,000 Units by mouth daily.   CRANBERRY PO    Take 1 capsule by mouth daily.   CYCLOSPORINE (RESTASIS) 0.05 % OPHTHALMIC EMULSION    Place 1 drop into both eyes 2 (two) times daily.    DICLOFENAC SODIUM (VOLTAREN) 1 % GEL    APPLY 2 TO 4 GRAMS TOPICALLY TO AFFECTED JOINTS UP TO 4 TIMES DAILY   EPINEPHRINE 0.3 MG/0.3 ML IJ SOAJ INJECTION    Inject 0.3 mg into the muscle as needed for anaphylaxis.    FEXOFENADINE (ALLEGRA) 180 MG TABLET    Take 180 mg by mouth daily.   FLUTICASONE (FLONASE) 50 MCG/ACT NASAL SPRAY    SPRAY 2 SPRAYS INTO EACH NOSTRIL EVERY DAY   FUROSEMIDE (LASIX) 80 MG TABLET    Take 1 tablet by mouth once daily   KETOCONAZOLE (NIZORAL) 2 % SHAMPOO    Apply 1 application topically once a week.    LANSOPRAZOLE (PREVACID) 30 MG CAPSULE    TAKE 1 CAPSULE BY MOUTH ONCE DAILY AT NOON   MELATONIN PO    Take 10 mg by mouth daily.    MONTELUKAST (SINGULAIR) 10 MG TABLET    TAKE 1 TABLET BY MOUTH AT BEDTIME   POTASSIUM CHLORIDE (KLOR-CON) 10 MEQ TABLET    TAKE 2  BY MOUTH TWICE DAILY   PREDNISONE (DELTASONE) 10 MG TABLET    Take 1 tablet by mouth once daily  with breakfast   SYRINGE/NEEDLE, DISP, (SYRINGE 3CC/25GX1") 25G X 1" 3 ML MISC    1 application by Does not apply route every 30 (thirty) days.   TRAMADOL (ULTRAM) 50 MG TABLET    TAKE 1 TABLET BY MOUTH 4 TIMES DAILY.  VITAMIN B-12 (CYANOCOBALAMIN) 1000 MCG TABLET    Take 1 tablet (1,000 mcg total) by mouth daily.  Modified Medications   No medications on file  Discontinued Medications   No medications on file    Physical Exam:  There were no vitals filed for this visit. There is no height or weight on file to calculate BMI. Wt Readings from Last 3 Encounters:  02/27/22 182 lb 6.4 oz (82.7 kg)  01/26/22 182 lb 3.2 oz (82.6 kg)  10/27/21 180 lb 12.8 oz (82 kg)    Physical Exam Vitals and nursing note reviewed.  Constitutional:      Appearance: She is obese.  Cardiovascular:     Rate and Rhythm: Normal rate and regular rhythm.  Pulmonary:     Effort: Pulmonary effort is normal.  Musculoskeletal:     Comments: There is muscle tenderness with compression but strength was tested in upper and lower extremities and is 5+ and symmetric  Neurological:     General: No focal deficit present.     Mental Status: She is alert and oriented to person, place, and time.    Labs reviewed: Basic Metabolic Panel: Recent Labs    06/20/21 0920 01/31/22 0740 02/27/22 1635  NA 145 143  --   K 4.0 3.9  --   CL 103 102  --   CO2 29 29  --   GLUCOSE 75 83  --   BUN 29* 25  --   CREATININE 1.14* 1.18*  --   CALCIUM 10.2 9.8  --   TSH  --   --  0.865   Liver Function Tests: Recent Labs    06/20/21 0920 01/31/22 0740  AST 16 14  ALT 15 13  BILITOT 0.5 0.6  PROT 7.4 7.4   No results for input(s): LIPASE, AMYLASE in the last 8760 hours. No results for input(s): AMMONIA in the last 8760 hours. CBC: Recent Labs    01/31/22 0740  WBC 15.9*  NEUTROABS 9,620*  HGB 13.8  HCT 41.2  MCV 97.6  PLT 309   Lipid Panel: No results for input(s): CHOL, HDL, LDLCALC, TRIG, CHOLHDL,  LDLDIRECT in the last 8760 hours. TSH: Recent Labs    02/27/22 1635  TSH 0.865   A1C: No results found for: HGBA1C   Assessment/Plan  1. Bilateral lower extremity edema Probably secondary to venous insufficiency.  She does have mild chronic kidney disease but no evidence of CHF she is on high-dose diuretics but with potassium supplementation  2. Pure hypercholesterolemia She takes atorvastatin 20 mg.  I am suggesting today that she go to 3 times a week to minimize potential for side effects. I suspect her malaise and fatigue may be related to chronic prednisone use and some muscle weakness related to that.  Possibly contribution from age.  We will see her back in 1 month to follow-up   Alain Honey, MD [place] [phone]

## 2022-05-15 ENCOUNTER — Other Ambulatory Visit: Payer: Self-pay | Admitting: Nurse Practitioner

## 2022-05-15 ENCOUNTER — Telehealth: Payer: Self-pay | Admitting: *Deleted

## 2022-05-15 DIAGNOSIS — H1013 Acute atopic conjunctivitis, bilateral: Secondary | ICD-10-CM

## 2022-05-15 NOTE — Telephone Encounter (Signed)
[employer] Called and left message on Clinical intake stating that the orders for the [employer] was signed by Dr. Alain Honey and they cannot use the orders because it has to be signed by Dr. Lyndel Safe.  Stated to call back and speak with Caren Griffins at [phone]  Called and [facility] to return call to get more information. Dr. Sabra Heck saw patient on 05/03/2022.

## 2022-05-21 ENCOUNTER — Inpatient Hospital Stay (HOSPITAL_COMMUNITY)
Admission: EM | Admit: 2022-05-21 | Discharge: 2022-05-30 | DRG: 871 | Disposition: A | Discharge status: Skilled Nursing Facility | Payer: Medicare PPO | Attending: Internal Medicine | Admitting: Internal Medicine

## 2022-05-21 ENCOUNTER — Emergency Department (HOSPITAL_COMMUNITY): Payer: Medicare PPO

## 2022-05-21 ENCOUNTER — Encounter (HOSPITAL_COMMUNITY): Payer: Self-pay | Admitting: Oncology

## 2022-05-21 ENCOUNTER — Other Ambulatory Visit: Payer: Self-pay

## 2022-05-21 DIAGNOSIS — M2550 Pain in unspecified joint: Secondary | ICD-10-CM | POA: Diagnosis not present

## 2022-05-21 DIAGNOSIS — E272 Addisonian crisis: Secondary | ICD-10-CM | POA: Diagnosis present

## 2022-05-21 DIAGNOSIS — R197 Diarrhea, unspecified: Secondary | ICD-10-CM | POA: Diagnosis not present

## 2022-05-21 DIAGNOSIS — I959 Hypotension, unspecified: Secondary | ICD-10-CM | POA: Diagnosis not present

## 2022-05-21 DIAGNOSIS — I5030 Unspecified diastolic (congestive) heart failure: Secondary | ICD-10-CM | POA: Diagnosis not present

## 2022-05-21 DIAGNOSIS — N3000 Acute cystitis without hematuria: Secondary | ICD-10-CM | POA: Diagnosis not present

## 2022-05-21 DIAGNOSIS — E872 Acidosis, unspecified: Secondary | ICD-10-CM | POA: Diagnosis not present

## 2022-05-21 DIAGNOSIS — K219 Gastro-esophageal reflux disease without esophagitis: Secondary | ICD-10-CM | POA: Diagnosis present

## 2022-05-21 DIAGNOSIS — Z833 Family history of diabetes mellitus: Secondary | ICD-10-CM

## 2022-05-21 DIAGNOSIS — R000 Tachycardia, unspecified: Secondary | ICD-10-CM | POA: Diagnosis present

## 2022-05-21 DIAGNOSIS — I5032 Chronic diastolic (congestive) heart failure: Secondary | ICD-10-CM | POA: Diagnosis not present

## 2022-05-21 DIAGNOSIS — M0579 Rheumatoid arthritis with rheumatoid factor of multiple sites without organ or systems involvement: Secondary | ICD-10-CM | POA: Diagnosis not present

## 2022-05-21 DIAGNOSIS — R6521 Severe sepsis with septic shock: Secondary | ICD-10-CM | POA: Diagnosis not present

## 2022-05-21 DIAGNOSIS — J45909 Unspecified asthma, uncomplicated: Secondary | ICD-10-CM | POA: Diagnosis present

## 2022-05-21 DIAGNOSIS — M109 Gout, unspecified: Secondary | ICD-10-CM | POA: Diagnosis not present

## 2022-05-21 DIAGNOSIS — R140 Abdominal distension (gaseous): Secondary | ICD-10-CM | POA: Diagnosis not present

## 2022-05-21 DIAGNOSIS — Z9842 Cataract extraction status, left eye: Secondary | ICD-10-CM

## 2022-05-21 DIAGNOSIS — Z886 Allergy status to analgesic agent status: Secondary | ICD-10-CM

## 2022-05-21 DIAGNOSIS — M797 Fibromyalgia: Secondary | ICD-10-CM | POA: Diagnosis present

## 2022-05-21 DIAGNOSIS — G8929 Other chronic pain: Secondary | ICD-10-CM | POA: Diagnosis present

## 2022-05-21 DIAGNOSIS — D696 Thrombocytopenia, unspecified: Secondary | ICD-10-CM | POA: Diagnosis not present

## 2022-05-21 DIAGNOSIS — R531 Weakness: Secondary | ICD-10-CM | POA: Diagnosis not present

## 2022-05-21 DIAGNOSIS — Z9841 Cataract extraction status, right eye: Secondary | ICD-10-CM

## 2022-05-21 DIAGNOSIS — Z880 Allergy status to penicillin: Secondary | ICD-10-CM

## 2022-05-21 DIAGNOSIS — M47816 Spondylosis without myelopathy or radiculopathy, lumbar region: Secondary | ICD-10-CM

## 2022-05-21 DIAGNOSIS — M810 Age-related osteoporosis without current pathological fracture: Secondary | ICD-10-CM | POA: Diagnosis present

## 2022-05-21 DIAGNOSIS — A4151 Sepsis due to Escherichia coli [E. coli]: Principal | ICD-10-CM | POA: Diagnosis present

## 2022-05-21 DIAGNOSIS — G9341 Metabolic encephalopathy: Secondary | ICD-10-CM | POA: Diagnosis not present

## 2022-05-21 DIAGNOSIS — Z0389 Encounter for observation for other suspected diseases and conditions ruled out: Secondary | ICD-10-CM | POA: Diagnosis not present

## 2022-05-21 DIAGNOSIS — B9620 Unspecified Escherichia coli [E. coli] as the cause of diseases classified elsewhere: Secondary | ICD-10-CM | POA: Diagnosis present

## 2022-05-21 DIAGNOSIS — R062 Wheezing: Secondary | ICD-10-CM | POA: Diagnosis not present

## 2022-05-21 DIAGNOSIS — Z981 Arthrodesis status: Secondary | ICD-10-CM

## 2022-05-21 DIAGNOSIS — E876 Hypokalemia: Secondary | ICD-10-CM

## 2022-05-21 DIAGNOSIS — Z91013 Allergy to seafood: Secondary | ICD-10-CM

## 2022-05-21 DIAGNOSIS — E669 Obesity, unspecified: Secondary | ICD-10-CM | POA: Diagnosis present

## 2022-05-21 DIAGNOSIS — Z6831 Body mass index (BMI) 31.0-31.9, adult: Secondary | ICD-10-CM

## 2022-05-21 DIAGNOSIS — D539 Nutritional anemia, unspecified: Secondary | ICD-10-CM | POA: Diagnosis not present

## 2022-05-21 DIAGNOSIS — R1032 Left lower quadrant pain: Secondary | ICD-10-CM | POA: Diagnosis not present

## 2022-05-21 DIAGNOSIS — R7881 Bacteremia: Secondary | ICD-10-CM | POA: Diagnosis not present

## 2022-05-21 DIAGNOSIS — Z96619 Presence of unspecified artificial shoulder joint: Secondary | ICD-10-CM | POA: Diagnosis present

## 2022-05-21 DIAGNOSIS — I130 Hypertensive heart and chronic kidney disease with heart failure and stage 1 through stage 4 chronic kidney disease, or unspecified chronic kidney disease: Secondary | ICD-10-CM | POA: Diagnosis present

## 2022-05-21 DIAGNOSIS — I82441 Acute embolism and thrombosis of right tibial vein: Secondary | ICD-10-CM | POA: Diagnosis present

## 2022-05-21 DIAGNOSIS — N1831 Chronic kidney disease, stage 3a: Secondary | ICD-10-CM | POA: Diagnosis present

## 2022-05-21 DIAGNOSIS — Z7952 Long term (current) use of systemic steroids: Secondary | ICD-10-CM | POA: Diagnosis not present

## 2022-05-21 DIAGNOSIS — Z7401 Bed confinement status: Secondary | ICD-10-CM | POA: Diagnosis not present

## 2022-05-21 DIAGNOSIS — M069 Rheumatoid arthritis, unspecified: Secondary | ICD-10-CM | POA: Diagnosis present

## 2022-05-21 DIAGNOSIS — R112 Nausea with vomiting, unspecified: Secondary | ICD-10-CM | POA: Diagnosis not present

## 2022-05-21 DIAGNOSIS — Z87891 Personal history of nicotine dependence: Secondary | ICD-10-CM

## 2022-05-21 DIAGNOSIS — Z8249 Family history of ischemic heart disease and other diseases of the circulatory system: Secondary | ICD-10-CM

## 2022-05-21 DIAGNOSIS — E785 Hyperlipidemia, unspecified: Secondary | ICD-10-CM | POA: Diagnosis present

## 2022-05-21 DIAGNOSIS — Z86718 Personal history of other venous thrombosis and embolism: Secondary | ICD-10-CM

## 2022-05-21 DIAGNOSIS — N390 Urinary tract infection, site not specified: Secondary | ICD-10-CM | POA: Diagnosis not present

## 2022-05-21 DIAGNOSIS — Z20822 Contact with and (suspected) exposure to covid-19: Secondary | ICD-10-CM | POA: Diagnosis present

## 2022-05-21 DIAGNOSIS — I82409 Acute embolism and thrombosis of unspecified deep veins of unspecified lower extremity: Secondary | ICD-10-CM

## 2022-05-21 DIAGNOSIS — A419 Sepsis, unspecified organism: Secondary | ICD-10-CM | POA: Diagnosis not present

## 2022-05-21 DIAGNOSIS — M25571 Pain in right ankle and joints of right foot: Secondary | ICD-10-CM | POA: Diagnosis not present

## 2022-05-21 DIAGNOSIS — Z79899 Other long term (current) drug therapy: Secondary | ICD-10-CM

## 2022-05-21 DIAGNOSIS — M7989 Other specified soft tissue disorders: Secondary | ICD-10-CM | POA: Diagnosis not present

## 2022-05-21 DIAGNOSIS — Z888 Allergy status to other drugs, medicaments and biological substances status: Secondary | ICD-10-CM

## 2022-05-21 DIAGNOSIS — G9340 Encephalopathy, unspecified: Secondary | ICD-10-CM | POA: Diagnosis not present

## 2022-05-21 DIAGNOSIS — Z743 Need for continuous supervision: Secondary | ICD-10-CM | POA: Diagnosis not present

## 2022-05-21 DIAGNOSIS — M1611 Unilateral primary osteoarthritis, right hip: Secondary | ICD-10-CM | POA: Diagnosis not present

## 2022-05-21 DIAGNOSIS — R609 Edema, unspecified: Secondary | ICD-10-CM | POA: Diagnosis not present

## 2022-05-21 LAB — COMPREHENSIVE METABOLIC PANEL
ALT: 23 U/L (ref 0–44)
AST: 24 U/L (ref 15–41)
Albumin: 3.4 g/dL — ABNORMAL LOW (ref 3.5–5.0)
Alkaline Phosphatase: 56 U/L (ref 38–126)
Anion gap: 10 (ref 5–15)
BUN: 22 mg/dL (ref 8–23)
CO2: 28 mmol/L (ref 22–32)
Calcium: 9.2 mg/dL (ref 8.9–10.3)
Chloride: 102 mmol/L (ref 98–111)
Creatinine, Ser: 1.08 mg/dL — ABNORMAL HIGH (ref 0.44–1.00)
GFR, Estimated: 50 mL/min — ABNORMAL LOW (ref >60)
Glucose, Bld: 129 mg/dL — ABNORMAL HIGH (ref 70–99)
Potassium: 3.1 mmol/L — ABNORMAL LOW (ref 3.5–5.1)
Sodium: 140 mmol/L (ref 135–145)
Total Bilirubin: 1.2 mg/dL (ref 0.3–1.2)
Total Protein: 7.3 g/dL (ref 6.5–8.1)

## 2022-05-21 LAB — URINALYSIS, ROUTINE W REFLEX MICROSCOPIC
Bilirubin Urine: NEGATIVE
Glucose, UA: NEGATIVE mg/dL
Ketones, ur: NEGATIVE mg/dL
Nitrite: NEGATIVE
Protein, ur: 30 mg/dL — AB
Specific Gravity, Urine: 1.009 (ref 1.005–1.030)
WBC, UA: >50 WBC/hpf — ABNORMAL HIGH (ref 0–5)
pH: 6 (ref 5.0–8.0)

## 2022-05-21 LAB — MRSA NEXT GEN BY PCR, NASAL: MRSA by PCR Next Gen: NOT DETECTED

## 2022-05-21 LAB — BLOOD GAS, ARTERIAL
Acid-base deficit: 1.5 mmol/L (ref 0.0–2.0)
Bicarbonate: 23.7 mmol/L (ref 20.0–28.0)
Drawn by: 22052
FIO2: 36 %
O2 Content: 4 L/min
O2 Saturation: 95.6 %
Patient temperature: 37
pCO2 arterial: 41 mmHg (ref 32–48)
pH, Arterial: 7.37 (ref 7.35–7.45)
pO2, Arterial: 71 mmHg — ABNORMAL LOW (ref 83–108)

## 2022-05-21 LAB — GLUCOSE, CAPILLARY: Glucose-Capillary: 167 mg/dL — ABNORMAL HIGH (ref 70–99)

## 2022-05-21 LAB — LACTIC ACID, PLASMA
Lactic Acid, Venous: 2.4 mmol/L (ref 0.5–1.9)
Lactic Acid, Venous: 3.8 mmol/L (ref 0.5–1.9)
Lactic Acid, Venous: 2.2 mmol/L (ref 0.5–1.9)

## 2022-05-21 LAB — CBC WITH DIFFERENTIAL/PLATELET
Abs Immature Granulocytes: 0.07 10*3/uL (ref 0.00–0.07)
Basophils Absolute: 0.1 10*3/uL (ref 0.0–0.1)
Basophils Relative: 1 %
Eosinophils Absolute: 0.1 10*3/uL (ref 0.0–0.5)
Eosinophils Relative: 1 %
HCT: 41.5 % (ref 36.0–46.0)
Hemoglobin: 13.3 g/dL (ref 12.0–15.0)
Immature Granulocytes: 1 %
Lymphocytes Relative: 15 %
Lymphs Abs: 2.3 10*3/uL (ref 0.7–4.0)
MCH: 31.7 pg (ref 26.0–34.0)
MCHC: 32 g/dL (ref 30.0–36.0)
MCV: 98.8 fL (ref 80.0–100.0)
Monocytes Absolute: 1.1 10*3/uL — ABNORMAL HIGH (ref 0.1–1.0)
Monocytes Relative: 7 %
Neutro Abs: 11.6 10*3/uL — ABNORMAL HIGH (ref 1.7–7.7)
Neutrophils Relative %: 75 %
Platelets: 206 10*3/uL (ref 150–400)
RBC: 4.2 MIL/uL (ref 3.87–5.11)
RDW: 14.2 % (ref 11.5–15.5)
WBC: 15.2 10*3/uL — ABNORMAL HIGH (ref 4.0–10.5)
nRBC: 0 % (ref 0.0–0.2)

## 2022-05-21 LAB — MAGNESIUM: Magnesium: 2.1 mg/dL (ref 1.7–2.4)

## 2022-05-21 LAB — SARS CORONAVIRUS 2 BY RT PCR: SARS Coronavirus 2 by RT PCR: NEGATIVE

## 2022-05-21 LAB — LIPASE, BLOOD: Lipase: 22 U/L (ref 11–51)

## 2022-05-21 MED ORDER — LACTATED RINGERS IV BOLUS
500.0000 mL | Freq: Once | INTRAVENOUS | Status: AC
  Administered 2022-05-21: 500 mL via INTRAVENOUS

## 2022-05-21 MED ORDER — LACTATED RINGERS IV SOLN: INTRAVENOUS | Status: AC

## 2022-05-21 MED ORDER — ACETAMINOPHEN 325 MG PO TABS
650.0000 mg | ORAL_TABLET | Freq: Four times a day (QID) | ORAL | Status: DC | PRN
  Administered 2022-05-21 – 2022-05-29 (×11): 650 mg via ORAL
  Filled 2022-05-21 (×12): qty 2

## 2022-05-21 MED ORDER — ACETAMINOPHEN 650 MG RE SUPP: 650.0000 mg | Freq: Four times a day (QID) | RECTAL | Status: DC | PRN

## 2022-05-21 MED ORDER — ORAL CARE MOUTH RINSE
15.0000 mL | Freq: Two times a day (BID) | OROMUCOSAL | Status: DC
  Administered 2022-05-22 – 2022-05-30 (×17): 15 mL via OROMUCOSAL

## 2022-05-21 MED ORDER — LACTATED RINGERS IV BOLUS
1000.0000 mL | Freq: Once | INTRAVENOUS | Status: AC
  Administered 2022-05-21: 1000 mL via INTRAVENOUS

## 2022-05-21 MED ORDER — CHLORHEXIDINE GLUCONATE CLOTH 2 % EX PADS
6.0000 | MEDICATED_PAD | Freq: Every day | CUTANEOUS | Status: DC
  Administered 2022-05-21 – 2022-05-25 (×4): 6 via TOPICAL

## 2022-05-21 MED ORDER — METOPROLOL TARTRATE 5 MG/5ML IV SOLN
2.5000 mg | Freq: Four times a day (QID) | INTRAVENOUS | Status: DC | PRN
Start: 2022-05-21 — End: 2022-05-21
  Administered 2022-05-21: 2.5 mg via INTRAVENOUS
  Filled 2022-05-21: qty 5

## 2022-05-21 MED ORDER — METRONIDAZOLE 500 MG/100ML IV SOLN
500.0000 mg | Freq: Two times a day (BID) | INTRAVENOUS | Status: DC
  Administered 2022-05-21 – 2022-05-26 (×10): 500 mg via INTRAVENOUS
  Filled 2022-05-21 (×10): qty 100

## 2022-05-21 MED ORDER — ACETAMINOPHEN 650 MG RE SUPP
RECTAL | Status: AC
  Filled 2022-05-21: qty 1

## 2022-05-21 MED ORDER — SODIUM CHLORIDE 0.9 % IV BOLUS
1000.0000 mL | Freq: Once | INTRAVENOUS | Status: AC
  Administered 2022-05-21: 1000 mL via INTRAVENOUS

## 2022-05-21 MED ORDER — MONTELUKAST SODIUM 10 MG PO TABS
10.0000 mg | ORAL_TABLET | Freq: Every day | ORAL | Status: DC
  Administered 2022-05-22 – 2022-05-29 (×8): 10 mg via ORAL
  Filled 2022-05-21 (×10): qty 1

## 2022-05-21 MED ORDER — FLUTICASONE PROPIONATE 50 MCG/ACT NA SUSP
2.0000 | Freq: Every day | NASAL | Status: DC
Start: 2022-05-22 — End: 2022-05-30
  Administered 2022-05-22 – 2022-05-30 (×9): 2 via NASAL
  Filled 2022-05-21 (×2): qty 16

## 2022-05-21 MED ORDER — SODIUM CHLORIDE 0.9 % IV SOLN
2.0000 g | Freq: Once | INTRAVENOUS | Status: AC
  Administered 2022-05-21: 2 g via INTRAVENOUS
  Filled 2022-05-21: qty 12.5

## 2022-05-21 MED ORDER — ATORVASTATIN CALCIUM 10 MG PO TABS
20.0000 mg | ORAL_TABLET | Freq: Every day | ORAL | Status: DC
  Administered 2022-05-21 – 2022-05-30 (×10): 20 mg via ORAL
  Filled 2022-05-21 (×8): qty 2
  Filled 2022-05-21: qty 1
  Filled 2022-05-21: qty 2

## 2022-05-21 MED ORDER — POTASSIUM CHLORIDE 10 MEQ/100ML IV SOLN
10.0000 meq | Freq: Once | INTRAVENOUS | Status: AC
  Administered 2022-05-21: 10 meq via INTRAVENOUS
  Filled 2022-05-21: qty 100

## 2022-05-21 MED ORDER — CYCLOSPORINE 0.05 % OP EMUL
1.0000 [drp] | Freq: Two times a day (BID) | OPHTHALMIC | Status: DC
Start: 2022-05-21 — End: 2022-05-30
  Administered 2022-05-22 – 2022-05-30 (×17): 1 [drp] via OPHTHALMIC
  Filled 2022-05-21 (×19): qty 30

## 2022-05-21 MED ORDER — TRAMADOL HCL 50 MG PO TABS
50.0000 mg | ORAL_TABLET | Freq: Four times a day (QID) | ORAL | Status: DC
  Administered 2022-05-21 – 2022-05-27 (×21): 50 mg via ORAL
  Filled 2022-05-21 (×23): qty 1

## 2022-05-21 MED ORDER — ONDANSETRON HCL 4 MG PO TABS: 4.0000 mg | ORAL_TABLET | Freq: Four times a day (QID) | ORAL | Status: DC | PRN

## 2022-05-21 MED ORDER — NOREPINEPHRINE 4 MG/250ML-% IV SOLN
INTRAVENOUS | Status: AC
  Filled 2022-05-21: qty 250

## 2022-05-21 MED ORDER — METRONIDAZOLE 500 MG/100ML IV SOLN
500.0000 mg | Freq: Once | INTRAVENOUS | Status: AC
  Administered 2022-05-21: 500 mg via INTRAVENOUS
  Filled 2022-05-21: qty 100

## 2022-05-21 MED ORDER — IOHEXOL 300 MG/ML  SOLN
100.0000 mL | Freq: Once | INTRAMUSCULAR | Status: AC | PRN
  Administered 2022-05-21: 100 mL via INTRAVENOUS

## 2022-05-21 MED ORDER — ONDANSETRON HCL 4 MG/2ML IJ SOLN
4.0000 mg | Freq: Once | INTRAMUSCULAR | Status: AC
  Administered 2022-05-21: 4 mg via INTRAVENOUS
  Filled 2022-05-21: qty 2

## 2022-05-21 MED ORDER — SODIUM CHLORIDE 0.9 % IV SOLN
2.0000 g | Freq: Two times a day (BID) | INTRAVENOUS | Status: DC
  Administered 2022-05-22 (×2): 2 g via INTRAVENOUS
  Filled 2022-05-21 (×2): qty 12.5

## 2022-05-21 MED ORDER — NOREPINEPHRINE 4 MG/250ML-% IV SOLN
2.0000 ug/min | INTRAVENOUS | Status: DC
  Administered 2022-05-21: 2 ug/min via INTRAVENOUS
  Administered 2022-05-22: 6 ug/min via INTRAVENOUS
  Filled 2022-05-21: qty 250

## 2022-05-21 MED ORDER — SODIUM CHLORIDE 0.9 % IV SOLN
250.0000 mL | INTRAVENOUS | Status: DC
  Administered 2022-05-21: 250 mL via INTRAVENOUS

## 2022-05-21 MED ORDER — MELATONIN 5 MG PO TABS
10.0000 mg | ORAL_TABLET | Freq: Every day | ORAL | Status: DC
  Filled 2022-05-21: qty 2

## 2022-05-21 MED ORDER — ONDANSETRON HCL 4 MG/2ML IJ SOLN
4.0000 mg | Freq: Four times a day (QID) | INTRAMUSCULAR | Status: DC | PRN
  Administered 2022-05-25 – 2022-05-28 (×2): 4 mg via INTRAVENOUS
  Filled 2022-05-21 (×3): qty 2

## 2022-05-21 MED ORDER — PREDNISONE 10 MG PO TABS: 10.0000 mg | ORAL_TABLET | Freq: Every day | ORAL | Status: DC

## 2022-05-21 MED ORDER — POTASSIUM CHLORIDE 10 MEQ/100ML IV SOLN
10.0000 meq | INTRAVENOUS | Status: AC
  Administered 2022-05-21 (×4): 10 meq via INTRAVENOUS
  Filled 2022-05-21 (×3): qty 100

## 2022-05-21 NOTE — Progress Notes (Signed)
Pharmacy Antibiotic Note  Eileen Wilkerson is a 88 y.o. female admitted on 05/21/2022 with sepsis and UTI.  Pharmacy has been consulted for Cefepime dosing.  Plan: Cefepime 2g IV q12h  Follow up renal function, culture results, and clinical course.   Height: 5' 2.5" (158.8 cm) Weight: 81.6 kg (180 lb) IBW/kg (Calculated) : 51.25  Temp (24hrs), Avg:101.4 F (38.6 C), Min:98.2 F (36.8 C), Max:105.5 F (40.8 C)  Recent Labs  Lab 05/21/22 1205 05/21/22 1215  WBC 15.2*  --   CREATININE 1.08*  --   LATICACIDVEN  --  2.4*    Estimated Creatinine Clearance: 36.7 mL/min (A) (by C-G formula based on SCr of 1.08 mg/dL (H)).    Allergies  Allergen Reactions   Adhesive [Tape] Rash   Celebrex [Celecoxib] Hives   Ciprofibrate Nausea Only   Cymbalta [Duloxetine Hcl] Swelling   Gabitril [Tiagabine] Swelling   Keflex [Cephalexin] Nausea And Vomiting   Lyrica [Pregabalin] Swelling   Neurontin [Gabapentin] Swelling   Nexium [Esomeprazole] Rash   Nsaids Rash   Penicillins Rash    Injection site reaction. Tolerated cefepime in past Has patient had a PCN reaction causing immediate rash, facial/tongue/throat swelling, SOB or lightheadedness with hypotension: No Has patient had a PCN reaction causing severe rash involving mucus membranes or skin necrosis: No Has patient had a PCN reaction that required hospitalization No Has patient had a PCN reaction occurring within the last 10 years: No If all of the above answers are "NO", then may proceed with Cephalosporin use.    Shrimp [Shellfish Allergy] Anaphylaxis    Per patient "shrimp only"   Sulfa Antibiotics Nausea And Vomiting   Azactam [Aztreonam]     Hand swelling    Azelastine Hcl     Rash    Ciprofloxacin Other (See Comments)    dizziness   Claritin [Loratadine]     Irritability Nervousness    Methotrexate Derivatives    Nasacort [Triamcinolone]     Dizzy    Olopatadine Other (See Comments)    Pain and lethargy    Other     Sulfamethizole Other (See Comments)    unknown   Zantac [Ranitidine Hcl] Other (See Comments)    unknown   Claritin-D 12 Hour [Loratadine-Pseudoephedrine Er] Anxiety    Antimicrobials this admission: 6/11 Cefepime >>  6/11 Metronidazole >>   Dose adjustments this admission:   Microbiology results: 6/11 BCx:  6/11 GI panel:  6/11 UCx:   Thank you for allowing pharmacy to be a part of this patient's care.  Gretta Arab PharmD, BCPS Clinical Pharmacist [facility] main pharmacy [phone] 05/21/2022 5:50 PM

## 2022-05-21 NOTE — Progress Notes (Signed)
ABG completed and sent to the lab. Lab called

## 2022-05-21 NOTE — Progress Notes (Signed)
   05/21/22 1748  Assess: MEWS Score  Temp (!) 105.5 F (40.8 C)  Assess: MEWS Score  MEWS Temp 2  MEWS Systolic 0  MEWS Pulse 3  MEWS RR 0  MEWS LOC 0  MEWS Score 5  MEWS Score Color Red  Assess: if the MEWS score is Yellow or Red  Were vital signs taken at a resting state? Yes  Focused Assessment Change from prior assessment (see assessment flowsheet)  Does the patient meet 2 or more of the SIRS criteria? Yes  Does the patient have a confirmed or suspected source of infection? Yes  Provider and Rapid Response Notified? Yes  MEWS guidelines implemented *See Row Information* Yes  Treat  MEWS Interventions Escalated (See documentation below)  Pain Scale PAINAD  Take Vital Signs  Increase Vital Sign Frequency  Red: Q 1hr X 4 then Q 4hr X 4, if remains red, continue Q 4hrs  Escalate  MEWS: Escalate Red: discuss with charge nurse/RN and provider, consider discussing with RRT  Notify: Charge Nurse/RN  Name of Charge Nurse/RN Notified Jessica, RN  Date Charge Nurse/RN Notified 05/21/22  Time Charge Nurse/RN Notified 1748  Notify: Provider  Provider Name/Title Marylyn Ishihara, MD  Date Provider Notified 05/21/22  Time Provider Notified 1748  Method of Notification Page  Notification Reason Change in status  Provider response See new orders  Date of Provider Response 05/21/22  Notify: Rapid Response  Name of Rapid Response RN Notified Rapid ICU nurse  Date Rapid Response Notified 05/21/22  Time Rapid Response Notified 4536  Assess: SIRS CRITERIA  SIRS Temperature  1  SIRS Pulse 1  SIRS Respirations  0  SIRS WBC 1  SIRS Score Sum  3

## 2022-05-21 NOTE — Progress Notes (Signed)
An USGPIV (ultrasound guided PIV) has been placed for short-term vasopressor infusion. A correctly placed ivWatch must be used when administering Vasopressors. Should this treatment be needed beyond 72 hours, central line access should be obtained.  It will be the responsibility of the bedside nurse to follow best practice to prevent extravasations.   ?

## 2022-05-21 NOTE — ED Provider Notes (Cosign Needed Addendum)
[place] DEPT Provider Note   CSN: [id number] Arrival date & time: 05/21/22  1147     History  Chief Complaint  Patient presents with   Diarrhea    Eileen Wilkerson is a 88 y.o. female with medical history significant for UTI, adrenal failure, asthma, diverticulitis.  The patient presents to the ED for evaluation of nausea and diarrhea.  Patient reports that beginning last night she has been experiencing nausea and diarrhea.  Patient was complaining about headaches and generalized body aches.  Patient denies any blood in stool.  Patient states he has history of diverticulitis.  Patient denies known sick contacts.  Patient currently resides at [facility], denies any illnesses going around nursing home.  Patient also arrives on 3 L nasal cannula, states that she does not wear oxygen at home.  Patient endorsing generalized abdominal pain, fever, nausea, diarrhea, body aches and chills, headaches.  Patient denies any vomiting, blood in stool, dysuria.   Diarrhea Associated symptoms: abdominal pain, chills, fever and headaches   Associated symptoms: no vomiting        Home Medications Prior to Admission medications   Medication Sig Start Date End Date Taking? Authorizing Provider  acetaminophen (TYLENOL) 500 MG tablet Take 500 mg by mouth every 6 (six) hours.    Yes [provider]  atorvastatin (LIPITOR) 20 MG tablet TAKE 1 TABLET BY MOUTH EVERY DAY 02/06/22  Yes Mast, [name], NP  Biotin 10 MG TABS Take 10 mg by mouth daily.   Yes [provider]  Biotin 5 MG TBDP Take 5,000 mcg by mouth daily. 03/15/22  Yes [provider]  Cholecalciferol (VITAMIN D3) 5000 units CAPS Take 5,000 Units by mouth daily.   Yes [provider]  CRANBERRY PO Take 1 capsule by mouth daily.   Yes [provider]  cycloSPORINE (RESTASIS) 0.05 % ophthalmic emulsion Place 1 drop into both eyes 2 (two) times daily.    Yes [provider]   fexofenadine (ALLEGRA) 180 MG tablet Take 180 mg by mouth daily.   Yes [provider]  fluticasone (FLONASE) 50 MCG/ACT nasal spray SPRAY 2 SPRAYS INTO EACH NOSTRIL EVERY DAY Patient taking differently: Place 2 sprays into both nostrils daily. 04/25/21  Yes Mast, [name], NP  furosemide (LASIX) 80 MG tablet Take 1 tablet by mouth once daily 04/10/22  Yes Mast, [name], NP  ketoconazole (NIZORAL) 2 % shampoo Apply 1 application topically once a week.    Yes [provider]  lansoprazole (PREVACID) 30 MG capsule TAKE 1 CAPSULE BY MOUTH ONCE DAILY AT NOON Patient taking differently: Take 30 mg by mouth daily at 12 noon. TAKE 1 CAPSULE BY MOUTH ONCE DAILY AT NOON 03/13/22  Yes Mast, [name], NP  MELATONIN PO Take 10 mg by mouth daily.    Yes [provider]  montelukast (SINGULAIR) 10 MG tablet TAKE 1 TABLET BY MOUTH AT BEDTIME Patient taking differently: Take 10 mg by mouth at bedtime. 05/15/22  Yes Mast, [name], NP  potassium chloride (KLOR-CON) 10 MEQ tablet TAKE 2  BY MOUTH TWICE DAILY Patient taking differently: 20 mEq 2 (two) times daily. TAKE 2  BY MOUTH TWICE DAILY 12/13/21  Yes Mast, [name], NP  predniSONE (DELTASONE) 10 MG tablet Take 1 tablet by mouth once daily with breakfast 02/06/22  Yes Mast, [name], NP  traMADol (ULTRAM) 50 MG tablet TAKE 1 TABLET BY MOUTH 4 TIMES DAILY. 12/15/21  Yes Mast, [name],  NP  vitamin B-12 (CYANOCOBALAMIN) 1000 MCG tablet Take 1 tablet (1,000 mcg total) by mouth daily. 03/31/22  Yes Mast, [name], NP  diclofenac Sodium (VOLTAREN) 1 % GEL APPLY 2 TO 4 GRAMS TOPICALLY TO AFFECTED JOINTS UP TO 4 TIMES DAILY Patient not taking: Reported on 05/21/2022 12/17/20   Ofilia Neas, PA-C  Syringe/Needle, Disp, (SYRINGE 3CC/25GX1") 25G X 1" 3 ML MISC 1 application by Does not apply route every 30 (thirty) days. 03/04/21   Mast, [name], NP      Allergies    Adhesive [tape], Celebrex [celecoxib], Ciprofibrate, Cymbalta [duloxetine hcl], Gabitril [tiagabine], Keflex  [cephalexin], Lyrica [pregabalin], Neurontin [gabapentin], Nexium [esomeprazole], Nsaids, Penicillins, Shrimp [shellfish allergy], Sulfa antibiotics, Azactam [aztreonam], Azelastine hcl, Ciprofloxacin, Claritin [loratadine], Methotrexate derivatives, Nasacort [triamcinolone], Olopatadine, Other, Sulfamethizole, Zantac [ranitidine hcl], and Claritin-d 12 hour [loratadine-pseudoephedrine er]    Review of Systems   Review of Systems  Constitutional:  Positive for chills and fever.  Gastrointestinal:  Positive for abdominal pain, diarrhea and nausea. Negative for blood in stool and vomiting.  Genitourinary:  Negative for dysuria.  Neurological:  Positive for headaches.  All other systems reviewed and are negative.   Physical Exam Updated Vital Signs BP 126/64   Pulse 98   Temp 98.5 F (36.9 C) (Rectal)   Resp 16   Ht 5' 2.5" (1.588 m)   Wt 81.6 kg   SpO2 94%   BMI 32.40 kg/m  Physical Exam Vitals and nursing note reviewed.  Constitutional:      General: She is not in acute distress.    Appearance: Normal appearance. She is not ill-appearing, toxic-appearing or diaphoretic.  HENT:     Head: Normocephalic and atraumatic.     Nose: Nose normal. No congestion.     Mouth/Throat:     Mouth: Mucous membranes are moist.     Pharynx: Oropharynx is clear.  Eyes:     Extraocular Movements: Extraocular movements intact.     Conjunctiva/sclera: Conjunctivae normal.     Pupils: Pupils are equal, round, and reactive to light.  Cardiovascular:     Rate and Rhythm: Normal rate and regular rhythm.  Pulmonary:     Effort: Pulmonary effort is normal.     Breath sounds: Normal breath sounds. No wheezing.  Abdominal:     General: Abdomen is flat. Bowel sounds are normal.     Palpations: Abdomen is soft.     Tenderness: There is abdominal tenderness. There is no guarding or rebound.     Comments: Generalized abdominal tenderness  Musculoskeletal:     Cervical back: Normal range of motion and  neck supple.  Skin:    General: Skin is warm and dry.     Capillary Refill: Capillary refill takes less than 2 seconds.  Neurological:     Mental Status: She is alert and oriented to person, place, and time.     ED Results / Procedures / Treatments   Labs (all labs ordered are listed, but only abnormal results are displayed) Labs Reviewed  CBC WITH DIFFERENTIAL/PLATELET - Abnormal; Notable for the following components:      Result Value   WBC 15.2 (*)    Neutro Abs 11.6 (*)    Monocytes Absolute 1.1 (*)    All other components within normal limits  COMPREHENSIVE METABOLIC PANEL - Abnormal; Notable for the following components:   Potassium 3.1 (*)    Glucose, Bld 129 (*)    Creatinine, Ser 1.08 (*)    Albumin 3.4 (*)  GFR, Estimated 50 (*)    All other components within normal limits  URINALYSIS, ROUTINE W REFLEX MICROSCOPIC - Abnormal; Notable for the following components:   APPearance CLOUDY (*)    Hgb urine dipstick MODERATE (*)    Protein, ur 30 (*)    Leukocytes,Ua LARGE (*)    WBC, UA >50 (*)    Bacteria, UA MANY (*)    All other components within normal limits  LACTIC ACID, PLASMA - Abnormal; Notable for the following components:   Lactic Acid, Venous 2.4 (*)    All other components within normal limits  SARS CORONAVIRUS 2 BY RT PCR  CULTURE, BLOOD (ROUTINE X 2)  CULTURE, BLOOD (ROUTINE X 2)  URINE CULTURE  LIPASE, BLOOD  LACTIC ACID, PLASMA    EKG None  Radiology CT ABDOMEN PELVIS W CONTRAST  Result Date: 05/21/2022 CLINICAL DATA:  Left lower quadrant abdominal pain with nausea and vomiting. EXAM: CT ABDOMEN AND PELVIS WITH CONTRAST TECHNIQUE: Multidetector CT imaging of the abdomen and pelvis was performed using the standard protocol following bolus administration of intravenous contrast. RADIATION DOSE REDUCTION: This exam was performed according to the departmental dose-optimization program which includes automated exposure control, adjustment of the mA  and/or kV according to patient size and/or use of iterative reconstruction technique. CONTRAST:  125m OMNIPAQUE IOHEXOL 300 MG/ML  SOLN COMPARISON:  11/15/2020. FINDINGS: Lower chest: No acute abnormality. Hepatobiliary: No focal liver abnormality is seen. No gallstones, gallbladder wall thickening, or biliary dilatation. Pancreas: Unremarkable. No pancreatic ductal dilatation or surrounding inflammatory changes. Spleen: Normal in size without focal abnormality. Adrenals/Urinary Tract: Normal adrenal glands. Few small low-density renal lesions, largest from the anterior upper pole the left kidney, 1.8 cm, consistent with a cyst. Smaller lesions are too small to characterize but are likely cysts. These findings are stable. No other renal masses, no stones and no hydronephrosis. Normal ureters. Normal bladder. Stomach/Bowel: Stomach is within normal limits. Appendix appears normal. No evidence of bowel wall thickening, distention, or inflammatory changes. Vascular/Lymphatic: Minor aortic atherosclerosis. No aneurysm. No enlarged lymph nodes. Reproductive: Normal postmenopausal uterus. Prominent left ovary versus left ovary cyst, 3.9 cm in greatest dimension, without change from the prior exam allowing for mild differences in measurement technique. A 2.8 cm simple appearing cyst was also noted arising from the left ovary on an ultrasound dated 04/22/2018. No right adnexal masses. Other: No abdominal wall hernia.  No ascites. Musculoskeletal: No fracture or acute finding. No bone lesion. Prior interbody fusion at L3-L4 and L4-L5. Advanced arthropathic changes of the right hip joint. Findings stable from the prior CT. IMPRESSION: 1. No acute findings within the abdomen or pelvis. 2. Left ovarian cyst, approximally 3.9 cm, unchanged from the prior CT and likely without change from the pelvic ultrasound dated 04/22/2018. Given the stability, no follow-up is recommended. 3. Mild aortic atherosclerosis. Electronically  Signed   By: DLajean ManesM.D.   On: 05/21/2022 14:57   DG Chest Portable 1 View  Result Date: 05/21/2022 CLINICAL DATA:  Rule out pneumonia EXAM: PORTABLE CHEST 1 VIEW COMPARISON:  02/27/2022 FINDINGS: The heart size and mediastinal contours are within normal limits. Both lungs are clear. The visualized skeletal structures are unremarkable. IMPRESSION: No acute abnormality of the lungs in AP portable projection. Electronically Signed   By: ADelanna AhmadiM.D.   On: 05/21/2022 14:03    Procedures .Critical Care  Performed by: GAzucena Cecil PA-C Authorized by: GAzucena Cecil PA-C   Critical care provider statement:  Critical care time (minutes):  75   Critical care time was exclusive of:  Separately billable procedures and treating other patients and teaching time   Critical care was necessary to treat or prevent imminent or life-threatening deterioration of the following conditions:  Sepsis   Critical care was time spent personally by me on the following activities:  Development of treatment plan with patient or surrogate, blood draw for specimens, discussions with consultants, evaluation of patient's response to treatment, examination of patient, interpretation of cardiac output measurements, obtaining history from patient or surrogate, vascular access procedures, review of old charts, re-evaluation of patient's condition, pulse oximetry, ordering and review of radiographic studies, ordering and review of laboratory studies and ordering and performing treatments and interventions   I assumed direction of critical care for this patient from another provider in my specialty: no     Care discussed with: admitting provider       Medications Ordered in ED Medications  sodium chloride 0.9 % bolus 1,000 mL (0 mLs Intravenous Stopped 05/21/22 1403)  ondansetron (ZOFRAN) injection 4 mg (4 mg Intravenous Given 05/21/22 1230)  potassium chloride 10 mEq in 100 mL IVPB (0 mEq Intravenous  Stopped 05/21/22 1531)  ceFEPIme (MAXIPIME) 2 g in sodium chloride 0.9 % 100 mL IVPB (0 g Intravenous Stopped 05/21/22 1526)    And  metroNIDAZOLE (FLAGYL) IVPB 500 mg (0 mg Intravenous Stopped 05/21/22 1531)  lactated ringers bolus 1,000 mL (1,000 mLs Intravenous New Bag/Given 05/21/22 1532)  iohexol (OMNIPAQUE) 300 MG/ML solution 100 mL (100 mLs Intravenous Contrast Given 05/21/22 1428)    ED Course/ Medical Decision Making/ A&P                           Medical Decision Making Amount and/or Complexity of Data Reviewed Labs: ordered. Radiology: ordered.  Risk Prescription drug management. Decision regarding hospitalization.   88 year old female presents to the ED for evaluation.  Please see HPI for further details.  On examination, the patient is afebrile, tachycardic to 106.  Patient is hypotensive at 82/61.  Patient appears diaphoretic.  Patient lung sounds are clear bilaterally, she is on 3 L of oxygen via nasal cannula which is a new oxygen requirement.  The patient's abdomen is soft and compressible however she does endorse generalized abdominal tenderness.  Patient follows commands appropriately.  Patient presentation raise concern for sepsis.  Lactic acid, blood cultures ordered at this time.  Patient worked up utilizing the following labs and imaging studies interpreted by me personally: - CBC elevated white blood cell count of 15.2 - CMP shows decreased potassium to 3.1, elevated creatinine at 1.08 however it appears that creatinine is baseline.  Patient will be provided with potassium chloride 10 mill equivalents and IV - Lactic acid elevated at 2.4 - Urinalysis concerning for infection, large leukocytes and bacteria noted - Lipase unremarkable - Respiratory panel negative - Urine cultures been sent off - Blood cultures has been sent off as well - Plan for imaging of chest shows no consolidation, effusion - CT abdomen pelvis shows no sign of diverticulitis, intra-abdominal  abnormality to account for pain  Initially, there was suspicion that this patient had diverticulitis as she stated that this was similar to how she presented last time she had diverticulitis.  The patient was started on broad-spectrum antibiotics to include cefepime as well as metronidazole due to patient allergies.  After CT abdomen pelvis resulted and it did not appear that this patient  had any kind of intra-abdominal pathology, it was believed that this patient was septic due to urosepsis, UTI.  The patient was continued on cefepime at this time, metronidazole was stopped.  The patient has received IV fluids and her pressure has responded well, her current blood pressure is 126/64.  Hospitalist paged for admission, Dr. Marylyn Ishihara has agreed to admit the patient for further management. I appreciate his time and assistance. The patient and her daughter who are at the bedside are agreeable to the plan.  The patient is stable at this time.   Final Clinical Impression(s) / ED Diagnoses Final diagnoses:  Acute cystitis without hematuria  Sepsis associated hypotension [facility])    Rx / DC Orders ED Discharge Orders     None           Azucena Cecil, PA-C 82/95/62 1308    Campbell Stall P, DO 65/78/46 [id number]

## 2022-05-21 NOTE — ED Triage Notes (Signed)
Pt bib PTAR from [place] d/t N/D that began last night. Pt also c/o HA and generalized body aches.

## 2022-05-21 NOTE — Plan of Care (Signed)
Patient oriented tom person and informed of why she was going to ICU with no evidence of learning at this time.  Problem: Education: Goal: Knowledge of General Education information will improve Description: Including pain rating scale, medication(s)/side effects and non-pharmacologic comfort measures Outcome: Not Progressing

## 2022-05-21 NOTE — ED Notes (Signed)
Both sets of cultures were collected

## 2022-05-21 NOTE — Progress Notes (Signed)
Patient transported via bed to ICU with RRT and transporter on telemetry box.

## 2022-05-21 NOTE — Progress Notes (Signed)
eLink Physician-Brief Progress Note Patient Name: Eileen Wilkerson DOB: [birth year] MRN: [id number]   Date of Service  05/21/2022  HPI/Events of Note  88 year old woman with UTI and diarrhea. Was hypotensive and per RN, has been given over 3 liter fluids. Remained hypotensive so started on levophed. Currently on 10 mic/min and on 2 liter o2, with o2 sat 99. In no distress and is awake and alert, following all commands . On LR at 100 cc/hour as well.   eICU Interventions  Follow cultures  LA 2.2 on last check  On antibiotics Maintain pressors for map 65 , current sbp in low 100s Ground team has been consulted by hospitalist Call E link if needed      Intervention Category Major Interventions: Shock - evaluation and management Evaluation Type: New Patient Evaluation  Eileen Wilkerson 05/21/2022, 11:29 PM  Addendum at 2:10 am 40 meq IV Kcl, 2 gram IV mag ordered  LA noted Dr Ruthann Cancer had ordered albumin  Recheck LA at 6 am

## 2022-05-21 NOTE — Progress Notes (Addendum)
RN called due to change in patient's mental status and worsening lactic acid.  Patient was admitted today due to sepsis secondary to UTI.  BP was 99/52 (MAP 65), temperature was 105.67F, lactic acid increased from 2.4 to 3.8.  At bedside, patient was somnolent, though easily arousable.  IV LR bolus 500 mL was started, patient was placed on maintenance IV hydration.  Tylenol will be given rectally, cooling blanket will be placed.  Patient will be transferred to stepdown unit with goal to possibly start patient on pressors if she does not respond to fluids.  We shall continue to trend lactic acid.  Blood culture pending  Total time:  15 minutes This includes time reviewing the chart including progress notes, labs, EKGs, taking medical decisions, ordering labs and documenting findings.  10:20PM Patient's BP dropped to 73/40 with a MAP of 49, due to tachycardia and tachypnea appears to have resolved at this time and lactic acid has improved to 2.2 from 3.8.  Patient is currently on IV LR bolus.  IV pressors (Levophed) peripherally will be started at this time to maintain O2 sat of 65 with goal to wean patient off Levophed as tolerated.  Continue IV hydration.  Patient currently in stepdown unit will be transferred to ICU. PCCM was consulted and will follow-up with patient.

## 2022-05-21 NOTE — ED Notes (Signed)
Urine culture sent down w/ UA at 1403.

## 2022-05-21 NOTE — H&P (Addendum)
History and Physical    Patient: Eileen Wilkerson:[id number] DOB: [birth year] DOA: 05/21/2022 DOS: the patient was seen and examined on 05/21/2022 PCP: Mast, [name], NP  Patient coming from: ALF/ILF  Chief Complaint:  Chief Complaint  Patient presents with   Diarrhea   HPI: Eileen Wilkerson is a 88 y.o. female with medical history significant of asthma, DDD, RA, fibromyalgia. Presenting with diarrhea. Symptoms started last night. She didn't try any medicines to help. There was no fever, no blood. She is not aware of any food triggers. She did have abdominal pain across the lower half of her stomach. She has not had any recent antibiotics or sick contacts. When her symptoms did not improve this morning, she decided to come to the ED for assistance. She denies any other aggravating or alleviating factors.   Review of Systems: As mentioned in the history of present illness. All other systems reviewed and are negative. Past Medical History:  Diagnosis Date   Adrenal failure ([place])    Arthritis    Asthma    Cancer ([place])    Cataract    Closed nondisplaced fracture of fifth right metatarsal bone 09/18/2017   Diverticulitis    Per patient   Fibromyalgia 2008   HCAP (healthcare-associated pneumonia) 02/03/2018   Osteoporosis    RA (rheumatoid arthritis) ([place])    Recurrent upper respiratory infection (URI)    Sepsis due to urinary tract infection ([place]) 01/18/2018   Urticaria    Past Surgical History:  Procedure Laterality Date   BACK SURGERY     BILATERAL CARPAL TUNNEL RELEASE  2005   right and left   CATARACT EXTRACTION, BILATERAL  2004   right and left   CERVICAL FUSION  2011,2010,2008   2 disks   HEEL SPUR SURGERY  2004   lower back fusion  2011   Fusion of 3-4 and 4-5 lower back   RADIOFREQUENCY ABLATION  2020   ROTATOR CUFF REPAIR  [id number]   SQUAMOUS CELL CARCINOMA EXCISION     TONSILLECTOMY AND ADENOIDECTOMY  1947   TOTAL SHOULDER ARTHROPLASTY     Social History:   reports that she quit smoking about 55 years ago. Her smoking use included cigarettes. She has a 7.50 pack-year smoking history. She has never used smokeless tobacco. She reports that she does not drink alcohol and does not use drugs.  Allergies  Allergen Reactions   Adhesive [Tape] Rash   Celebrex [Celecoxib] Hives   Ciprofibrate Nausea Only   Cymbalta [Duloxetine Hcl] Swelling   Gabitril [Tiagabine] Swelling   Keflex [Cephalexin] Nausea And Vomiting   Lyrica [Pregabalin] Swelling   Neurontin [Gabapentin] Swelling   Nexium [Esomeprazole] Rash   Nsaids Rash   Penicillins Rash    Injection site reaction. Tolerated cefepime in past Has patient had a PCN reaction causing immediate rash, facial/tongue/throat swelling, SOB or lightheadedness with hypotension: No Has patient had a PCN reaction causing severe rash involving mucus membranes or skin necrosis: No Has patient had a PCN reaction that required hospitalization No Has patient had a PCN reaction occurring within the last 10 years: No If all of the above answers are "NO", then may proceed with Cephalosporin use.    Shrimp [Shellfish Allergy] Anaphylaxis    Per patient "shrimp only"   Sulfa Antibiotics Nausea And Vomiting   Azactam [Aztreonam]     Hand swelling    Azelastine Hcl     Rash    Ciprofloxacin Other (See Comments)    dizziness  Claritin [Loratadine]     Irritability Nervousness    Methotrexate Derivatives    Nasacort [Triamcinolone]     Dizzy    Olopatadine Other (See Comments)    Pain and lethargy    Other    Sulfamethizole Other (See Comments)    unknown   Zantac [Ranitidine Hcl] Other (See Comments)    unknown   Claritin-D 12 Hour [Loratadine-Pseudoephedrine Er] Anxiety    Family History  Problem Relation Age of Onset   Heart attack Maternal Grandmother    Heart attack Paternal Grandfather    Breast cancer Mother 98   Diabetes Father    Heart disease Father    Congestive Heart Failure Father 34        Died from   Allergic rhinitis Neg Hx    Asthma Neg Hx    Eczema Neg Hx    Urticaria Neg Hx     Prior to Admission medications   Medication Sig Start Date End Date Taking? Authorizing Provider  acetaminophen (TYLENOL) 500 MG tablet Take 500 mg by mouth every 6 (six) hours.    Yes [provider]  atorvastatin (LIPITOR) 20 MG tablet TAKE 1 TABLET BY MOUTH EVERY DAY 02/06/22  Yes Mast, [name], NP  Biotin 10 MG TABS Take 10 mg by mouth daily.   Yes [provider]  Biotin 5 MG TBDP Take 5,000 mcg by mouth daily. 03/15/22  Yes [provider]  Cholecalciferol (VITAMIN D3) 5000 units CAPS Take 5,000 Units by mouth daily.   Yes [provider]  CRANBERRY PO Take 1 capsule by mouth daily.   Yes [provider]  cycloSPORINE (RESTASIS) 0.05 % ophthalmic emulsion Place 1 drop into both eyes 2 (two) times daily.    Yes [provider]  fexofenadine (ALLEGRA) 180 MG tablet Take 180 mg by mouth daily.   Yes [provider]  fluticasone (FLONASE) 50 MCG/ACT nasal spray SPRAY 2 SPRAYS INTO EACH NOSTRIL EVERY DAY Patient taking differently: Place 2 sprays into both nostrils daily. 04/25/21  Yes Mast, [name], NP  furosemide (LASIX) 80 MG tablet Take 1 tablet by mouth once daily 04/10/22  Yes Mast, [name], NP  ketoconazole (NIZORAL) 2 % shampoo Apply 1 application topically once a week.    Yes [provider]  lansoprazole (PREVACID) 30 MG capsule TAKE 1 CAPSULE BY MOUTH ONCE DAILY AT NOON Patient taking differently: Take 30 mg by mouth daily at 12 noon. TAKE 1 CAPSULE BY MOUTH ONCE DAILY AT NOON 03/13/22  Yes Mast, [name], NP  MELATONIN PO Take 10 mg by mouth daily.    Yes [provider]  montelukast (SINGULAIR) 10 MG tablet TAKE 1 TABLET BY MOUTH AT BEDTIME Patient taking differently: Take 10 mg by mouth at bedtime. 05/15/22  Yes Mast, [name], NP  potassium chloride (KLOR-CON) 10 MEQ tablet TAKE 2  BY MOUTH TWICE DAILY Patient taking  differently: 20 mEq 2 (two) times daily. TAKE 2  BY MOUTH TWICE DAILY 12/13/21  Yes Mast, [name], NP  predniSONE (DELTASONE) 10 MG tablet Take 1 tablet by mouth once daily with breakfast 02/06/22  Yes Mast, [name], NP  traMADol (ULTRAM) 50 MG tablet TAKE 1 TABLET BY MOUTH 4 TIMES DAILY. 12/15/21  Yes Mast, [name], NP  vitamin B-12 (CYANOCOBALAMIN) 1000 MCG tablet Take 1 tablet (1,000 mcg total) by mouth daily. 03/31/22  Yes Mast, [name], NP  diclofenac Sodium (VOLTAREN) 1 % GEL APPLY 2 TO 4  GRAMS TOPICALLY TO AFFECTED JOINTS UP TO 4 TIMES DAILY Patient not taking: Reported on 05/21/2022 12/17/20   Ofilia Neas, PA-C  Syringe/Needle, Disp, (SYRINGE 3CC/25GX1") 25G X 1" 3 ML MISC 1 application by Does not apply route every 30 (thirty) days. 03/04/21   Mast, [name], NP    Physical Exam: Vitals:   05/21/22 1405 05/21/22 1445 05/21/22 1500 05/21/22 1530  BP:  (!) 118/51 126/64 (!) 143/65  Pulse:  92 98 (!) 102  Resp:   16 (!) 22  Temp: 98.5 F (36.9 C)     TempSrc: Rectal     SpO2:  96% 94% 96%  Weight:      Height:       General: 88 y.o. female resting in bed in NAD Eyes: PERRL, normal sclera ENMT: Nares patent w/o discharge, orophaynx clear, dentition normal, ears w/o discharge/lesions/ulcers Neck: Supple, trachea midline Cardiovascular: tachy, +S1, S2, no m/g/r, equal pulses throughout Respiratory: CTABL, no w/r/r, normal WOB GI: BS+, ND, suprapubic and BLL ab TTP, no masses noted, no organomegaly noted MSK: No e/c/c Neuro: A&O x 3, no focal deficits, HoH, tremulous/shivering Psyc: Appropriate interaction and affect, calm/cooperative  Data Reviewed:  Na+  140 K+ 3.1 BUN  22 Scr  1.08 Lactic acid  2.4 WBC  15.2  CT ab/pelvis w/ 1. No acute findings within the abdomen or pelvis. 2. Left ovarian cyst, approximally 3.9 cm, unchanged from the prior CT and likely without change from the pelvic ultrasound dated 04/22/2018. Given the stability, no follow-up is recommended. 3. Mild aortic  atherosclerosis.  CXR: No acute abnormality of the lungs in AP portable projection.  Assessment and Plan: Sepsis secondary to UTI     - admit to inpt, progressive     - continue abx, fluids     - follow lactic acid, blood Cx, Ucx     - check GI PCR, c diff  Diarrhea     - as above  HTN     - initially hypotensive at presentation, but has become hypertensive after boluses     - will have PRN for tonight, can resume home regimen if she remains stable in AM  Hypokalemia     - replace K+, check Mg2+  HLD     - continue home regimen  Asthma     - continue home regimen  RA OA     - continue home regimen     - she is on chronic steroids  Chronic tachycardia     - per cardiology note from 02/27/22; she is chronically in the low 100s     - TSH normal during that time     - current sepsis is exacerbating this chronic condition     - place low dose PRN metoprolol for HR > 130  Advance Care Planning:   Code Status: DNI  Consults: None  Family Communication: w/ daughter at bedside  Severity of Illness: The appropriate patient status for this patient is INPATIENT. Inpatient status is judged to be reasonable and necessary in order to provide the required intensity of service to ensure the patient's safety. The patient's presenting symptoms, physical exam findings, and initial radiographic and laboratory data in the context of their chronic comorbidities is felt to place them at high risk for further clinical deterioration. Furthermore, it is not anticipated that the patient will be medically stable for discharge from the hospital within 2 midnights of admission.   * I certify that at the point  of admission it is my clinical judgment that the patient will require inpatient hospital care spanning beyond 2 midnights from the point of admission due to high intensity of service, high risk for further deterioration and high frequency of surveillance required.*  Author: Jonnie Finner,  DO 05/21/2022 3:55 PM  For on call review [email or web address].

## 2022-05-22 ENCOUNTER — Inpatient Hospital Stay (HOSPITAL_COMMUNITY): Payer: Medicare PPO

## 2022-05-22 DIAGNOSIS — R6521 Severe sepsis with septic shock: Secondary | ICD-10-CM | POA: Diagnosis not present

## 2022-05-22 DIAGNOSIS — B9620 Unspecified Escherichia coli [E. coli] as the cause of diseases classified elsewhere: Secondary | ICD-10-CM | POA: Diagnosis not present

## 2022-05-22 DIAGNOSIS — A419 Sepsis, unspecified organism: Secondary | ICD-10-CM | POA: Diagnosis not present

## 2022-05-22 DIAGNOSIS — I5030 Unspecified diastolic (congestive) heart failure: Secondary | ICD-10-CM | POA: Diagnosis not present

## 2022-05-22 DIAGNOSIS — R7881 Bacteremia: Secondary | ICD-10-CM

## 2022-05-22 DIAGNOSIS — A4151 Sepsis due to Escherichia coli [E. coli]: Secondary | ICD-10-CM | POA: Diagnosis not present

## 2022-05-22 DIAGNOSIS — G9340 Encephalopathy, unspecified: Secondary | ICD-10-CM

## 2022-05-22 LAB — COMPREHENSIVE METABOLIC PANEL
ALT: 22 U/L (ref 0–44)
AST: 30 U/L (ref 15–41)
Albumin: 2.7 g/dL — ABNORMAL LOW (ref 3.5–5.0)
Alkaline Phosphatase: 69 U/L (ref 38–126)
Anion gap: 11 (ref 5–15)
BUN: 18 mg/dL (ref 8–23)
CO2: 27 mmol/L (ref 22–32)
Calcium: 8.4 mg/dL — ABNORMAL LOW (ref 8.9–10.3)
Chloride: 106 mmol/L (ref 98–111)
Creatinine, Ser: 1.28 mg/dL — ABNORMAL HIGH (ref 0.44–1.00)
GFR, Estimated: 41 mL/min — ABNORMAL LOW (ref >60)
Glucose, Bld: 129 mg/dL — ABNORMAL HIGH (ref 70–99)
Potassium: 3.3 mmol/L — ABNORMAL LOW (ref 3.5–5.1)
Sodium: 144 mmol/L (ref 135–145)
Total Bilirubin: 1.2 mg/dL (ref 0.3–1.2)
Total Protein: 5.8 g/dL — ABNORMAL LOW (ref 6.5–8.1)

## 2022-05-22 LAB — ECHOCARDIOGRAM COMPLETE
AR max vel: 1.91 cm2
AV Peak grad: 8.6 mmHg
Ao pk vel: 1.47 m/s
Area-P 1/2: 3.97 cm2
Calc EF: 53.5 %
Height: 63 in
S' Lateral: 2.8 cm
Single Plane A2C EF: 58.8 %
Single Plane A4C EF: 52.3 %
Weight: 2825.42 oz

## 2022-05-22 LAB — CBC
HCT: 36.2 % (ref 36.0–46.0)
Hemoglobin: 11.5 g/dL — ABNORMAL LOW (ref 12.0–15.0)
MCH: 32.7 pg (ref 26.0–34.0)
MCHC: 31.8 g/dL (ref 30.0–36.0)
MCV: 102.8 fL — ABNORMAL HIGH (ref 80.0–100.0)
Platelets: 149 10*3/uL — ABNORMAL LOW (ref 150–400)
RBC: 3.52 MIL/uL — ABNORMAL LOW (ref 3.87–5.11)
RDW: 14.3 % (ref 11.5–15.5)
WBC: 21.5 10*3/uL — ABNORMAL HIGH (ref 4.0–10.5)
nRBC: 0 % (ref 0.0–0.2)

## 2022-05-22 LAB — BLOOD CULTURE ID PANEL (REFLEXED) - BCID2

## 2022-05-22 LAB — CORTISOL-AM, BLOOD: Cortisol - AM: >100 ug/dL — ABNORMAL HIGH (ref 6.7–22.6)

## 2022-05-22 LAB — LACTIC ACID, PLASMA
Lactic Acid, Venous: 2.5 mmol/L (ref 0.5–1.9)
Lactic Acid, Venous: 2 mmol/L (ref 0.5–1.9)

## 2022-05-22 LAB — BASIC METABOLIC PANEL
Anion gap: 9 (ref 5–15)
BUN: 16 mg/dL (ref 8–23)
CO2: 25 mmol/L (ref 22–32)
Calcium: 8.6 mg/dL — ABNORMAL LOW (ref 8.9–10.3)
Chloride: 109 mmol/L (ref 98–111)
Creatinine, Ser: 1.01 mg/dL — ABNORMAL HIGH (ref 0.44–1.00)
GFR, Estimated: 54 mL/min — ABNORMAL LOW (ref >60)
Glucose, Bld: 161 mg/dL — ABNORMAL HIGH (ref 70–99)
Potassium: 4 mmol/L (ref 3.5–5.1)
Sodium: 143 mmol/L (ref 135–145)

## 2022-05-22 LAB — PROTIME-INR
INR: 1.2 (ref 0.8–1.2)
Prothrombin Time: 15.3 seconds — ABNORMAL HIGH (ref 11.4–15.2)

## 2022-05-22 LAB — PROCALCITONIN: Procalcitonin: 110.35 ng/mL

## 2022-05-22 LAB — MAGNESIUM: Magnesium: 1.5 mg/dL — ABNORMAL LOW (ref 1.7–2.4)

## 2022-05-22 MED ORDER — ALBUMIN HUMAN 25 % IV SOLN
50.0000 g | Freq: Once | INTRAVENOUS | Status: AC
  Administered 2022-05-22: 50 g via INTRAVENOUS
  Filled 2022-05-22: qty 200

## 2022-05-22 MED ORDER — MAGNESIUM SULFATE 2 GM/50ML IV SOLN
2.0000 g | Freq: Once | INTRAVENOUS | Status: AC
  Administered 2022-05-22: 2 g via INTRAVENOUS
  Filled 2022-05-22: qty 50

## 2022-05-22 MED ORDER — PREDNISONE 10 MG PO TABS
10.0000 mg | ORAL_TABLET | Freq: Every day | ORAL | Status: DC
  Administered 2022-05-23 – 2022-05-29 (×7): 10 mg via ORAL
  Filled 2022-05-22 (×7): qty 1

## 2022-05-22 MED ORDER — POTASSIUM CHLORIDE 10 MEQ/100ML IV SOLN
10.0000 meq | INTRAVENOUS | Status: AC
  Administered 2022-05-22 (×4): 10 meq via INTRAVENOUS
  Filled 2022-05-22 (×3): qty 100

## 2022-05-22 MED ORDER — SODIUM CHLORIDE 0.9 % IV SOLN
2.0000 g | INTRAVENOUS | Status: DC
  Administered 2022-05-22 – 2022-05-23 (×2): 2 g via INTRAVENOUS
  Filled 2022-05-22 (×2): qty 20

## 2022-05-22 MED ORDER — PANTOPRAZOLE SODIUM 40 MG PO TBEC
40.0000 mg | DELAYED_RELEASE_TABLET | Freq: Every day | ORAL | Status: DC
  Administered 2022-05-22 – 2022-05-30 (×9): 40 mg via ORAL
  Filled 2022-05-22 (×9): qty 1

## 2022-05-22 MED ORDER — HYDROCORTISONE SOD SUC (PF) 100 MG IJ SOLR
100.0000 mg | Freq: Three times a day (TID) | INTRAMUSCULAR | Status: AC
  Administered 2022-05-22 (×2): 100 mg via INTRAVENOUS
  Filled 2022-05-22 (×2): qty 2

## 2022-05-22 NOTE — Consult Note (Signed)
NAME:  Eileen Wilkerson, MRN:  [id number], DOB:  10-Mar-1932, LOS: 1 ADMISSION DATE:  05/21/2022, CONSULTATION DATE:  05/22/22 REFERRING MD:  TRH, CHIEF COMPLAINT:  shock   History of Present Illness:  88 yo female with pmh asthma, ddd, ra, fibromyalgia presented to [facility] ED with diarrhea that began a day prior. She denied fever, blood, no n/v but endorses pain in her abdomen until she presented (she states this is improved now). She denies any sick contacts, food triggers, any other associated symptoms. She was admitted to [facility] for management of uti and diarrhea when overnight the pt became more hypotensive and ultimately req initiation of peripheral dose pressors.   CCM was thus consulted.   Upon eval pt was resting comfortably in icu. She is arousable and oriented when awoken but does state she is sleepy (exam at [place]). She states she feels better overall and has not had more diarrhea since she presented. She states that she had no dizziness, palpitations etc. Pt does take chronic steroids and is unsure when she took her last dose, these were not increased when she presented with sepsis.   Pertinent  Medical History  DDD Asthma RA Fibromyalgia   Significant Hospital Events: Including procedures, antibiotic start and stop dates in addition to other pertinent events   6/11 admitted to [facility] 6/12 ccm consulted for shock  Interim History / Subjective:    Objective   Blood pressure (!) 81/53, pulse (!) 103, temperature 98.8 F (37.1 C), temperature source Oral, resp. rate (!) 25, height '5\' 3"'$  (1.6 m), weight 80.1 kg, SpO2 96 %.        Intake/Output Summary (Last 24 hours) at 05/22/2022 0031 Last data filed at 05/21/2022 2200 Gross per 24 hour  Intake 1300 ml  Output 1 ml  Net 1299 ml   Filed Weights   05/21/22 1204 05/21/22 2200  Weight: 81.6 kg 80.1 kg    Examination: General: nad, resting supine in bed, drowsy but arousable HENT: ncat, eomi, perrla, mmmp, periorbital  edema/swelling Lungs: CTA Cardiovascular: RRR Abdomen: soft, nt,nd bs ++ Extremities: no c/c/e Neuro: arousable. Follows commands, communicative, oriented x4, no focal deficits GU: deferred  Resolved Hospital Problem list     Assessment & Plan:  Shock 2/2 sepsis 2/2 uti vs gi: -follow up cx -titrate levo peripherally -will give 2 doses of hydrocortisone in light of chronic steroid use and current shock -will increase home PO prednisone in am after these two doses as hopefully she will be a bit more brisk and can safely take po.  -will place central line if necessary but feel confident that she will wean with adequate abx (currently on gn and anaerobic coverage appropriately), increased steroids and the volume she has already received. -lactate already improving as well.  -changing titration parameters to systolic goal, in light of diastolic dysfunction and diastolic pressures in 62-70 with stystolics consistently >350 -echo in am  -place purewick if able to get accurate I/o but otherwise will need indwelling foley to monitor this parameter for adequate perfusion.   HFpEF:  -not in acute exacerbation -on lasix at home. Will likely need some diuresis when shock improved -echo pending for function  RA Fibro:  -resume pain meds as able avoiding nsaids in light of aki -increase steroids for time being and taper back to home dose when clinically indicated  Hyperlipidemia:  -cont home meds  CKD3a with aki:  -follow renal indices -purewic in place but will need indwelling if not able  to catch all fluids.   Lactic acidosis:  -downtrending will follow -cont ivf for now.   Hypomag:  -replace Hypokalemia:  -repalce  Best Practice (right click and "Reselect all SmartList Selections" daily)   Diet/type: NPO DVT prophylaxis: SCD GI prophylaxis: PPI Lines: N/A Foley:  Yes, and it is still needed Code Status:  limited Last date of multidisciplinary goals of care discussion  [per TRH, can discuss further with family in am. ]  Labs   CBC: Recent Labs  Lab 05/21/22 1205  WBC 15.2*  NEUTROABS 11.6*  HGB 13.3  HCT 41.5  MCV 98.8  PLT 497    Basic Metabolic Panel: Recent Labs  Lab 05/21/22 1205  NA 140  K 3.1*  CL 102  CO2 28  GLUCOSE 129*  BUN 22  CREATININE 1.08*  CALCIUM 9.2  MG 2.1   GFR: Estimated Creatinine Clearance: 36.8 mL/min (A) (by C-G formula based on SCr of 1.08 mg/dL (H)). Recent Labs  Lab 05/21/22 1205 05/21/22 1215 05/21/22 1825 05/21/22 2115  WBC 15.2*  --   --   --   LATICACIDVEN  --  2.4* 3.8* 2.2*    Liver Function Tests: Recent Labs  Lab 05/21/22 1205  AST 24  ALT 23  ALKPHOS 56  BILITOT 1.2  PROT 7.3  ALBUMIN 3.4*   Recent Labs  Lab 05/21/22 1205  LIPASE 22   No results for input(s): "AMMONIA" in the last 168 hours.  ABG    Component Value Date/Time   PHART 7.37 05/21/2022 1606   PCO2ART 41 05/21/2022 1606   PO2ART 71 (L) 05/21/2022 1606   HCO3 23.7 05/21/2022 1606   ACIDBASEDEF 1.5 05/21/2022 1606   O2SAT 95.6 05/21/2022 1606     Coagulation Profile: No results for input(s): "INR", "PROTIME" in the last 168 hours.  Cardiac Enzymes: No results for input(s): "CKTOTAL", "CKMB", "CKMBINDEX", "TROPONINI" in the last 168 hours.  HbA1C: No results found for: "HGBA1C"  CBG: Recent Labs  Lab 05/21/22 1727  GLUCAP 167*    Review of Systems:   As per HPI  Past Medical History:  She,  has a past medical history of Adrenal failure ([place]), Arthritis, Asthma, Cancer ([place]), Cataract, Closed nondisplaced fracture of fifth right metatarsal bone (09/18/2017), Diverticulitis, Fibromyalgia (2008), HCAP (healthcare-associated pneumonia) (02/03/2018), Osteoporosis, RA (rheumatoid arthritis) ([place]), Recurrent upper respiratory infection (URI), Sepsis due to urinary tract infection ([place]) (01/18/2018), and Urticaria.   Surgical History:   Past Surgical History:  Procedure Laterality Date   BACK SURGERY      BILATERAL CARPAL TUNNEL RELEASE  2005   right and left   CATARACT EXTRACTION, BILATERAL  2004   right and left   CERVICAL FUSION  2011,2010,2008   2 disks   HEEL SPUR SURGERY  2004   lower back fusion  2011   Fusion of 3-4 and 4-5 lower back   RADIOFREQUENCY ABLATION  2020   ROTATOR CUFF REPAIR  [id number]   SQUAMOUS CELL CARCINOMA EXCISION     TONSILLECTOMY AND ADENOIDECTOMY  1947   TOTAL SHOULDER ARTHROPLASTY       Social History:   reports that she quit smoking about 55 years ago. Her smoking use included cigarettes. She has a 7.50 pack-year smoking history. She has never used smokeless tobacco. She reports that she does not drink alcohol and does not use drugs.   Family History:  Her family history includes Breast cancer (age of onset: 45) in her mother; Congestive Heart Failure (age of onset: 42)  in her father; Diabetes in her father; Heart attack in her maternal grandmother and paternal grandfather; Heart disease in her father. There is no history of Allergic rhinitis, Asthma, Eczema, or Urticaria.   Allergies Allergies  Allergen Reactions   Adhesive [Tape] Rash   Celebrex [Celecoxib] Hives   Ciprofibrate Nausea Only   Cymbalta [Duloxetine Hcl] Swelling   Gabitril [Tiagabine] Swelling   Keflex [Cephalexin] Nausea And Vomiting   Lyrica [Pregabalin] Swelling   Neurontin [Gabapentin] Swelling   Nexium [Esomeprazole] Rash   Nsaids Rash   Penicillins Rash    Injection site reaction. Tolerated cefepime in past Has patient had a PCN reaction causing immediate rash, facial/tongue/throat swelling, SOB or lightheadedness with hypotension: No Has patient had a PCN reaction causing severe rash involving mucus membranes or skin necrosis: No Has patient had a PCN reaction that required hospitalization No Has patient had a PCN reaction occurring within the last 10 years: No If all of the above answers are "NO", then may proceed with Cephalosporin use.    Shrimp [Shellfish  Allergy] Anaphylaxis    Per patient "shrimp only"   Sulfa Antibiotics Nausea And Vomiting   Azactam [Aztreonam]     Hand swelling    Azelastine Hcl     Rash    Ciprofloxacin Other (See Comments)    dizziness   Claritin [Loratadine]     Irritability Nervousness    Methotrexate Derivatives    Nasacort [Triamcinolone]     Dizzy    Olopatadine Other (See Comments)    Pain and lethargy    Other    Sulfamethizole Other (See Comments)    unknown   Zantac [Ranitidine Hcl] Other (See Comments)    unknown   Claritin-D 12 Hour [Loratadine-Pseudoephedrine Er] Anxiety     Home Medications  Prior to Admission medications   Medication Sig Start Date End Date Taking? Authorizing Provider  acetaminophen (TYLENOL) 500 MG tablet Take 500 mg by mouth every 6 (six) hours.    Yes [provider]  atorvastatin (LIPITOR) 20 MG tablet TAKE 1 TABLET BY MOUTH EVERY DAY 02/06/22  Yes Mast, [name], NP  Biotin 10 MG TABS Take 10 mg by mouth daily.   Yes [provider]  Biotin 5 MG TBDP Take 5,000 mcg by mouth daily. 03/15/22  Yes [provider]  Cholecalciferol (VITAMIN D3) 5000 units CAPS Take 5,000 Units by mouth daily.   Yes [provider]  CRANBERRY PO Take 1 capsule by mouth daily.   Yes [provider]  cycloSPORINE (RESTASIS) 0.05 % ophthalmic emulsion Place 1 drop into both eyes 2 (two) times daily.    Yes [provider]  fexofenadine (ALLEGRA) 180 MG tablet Take 180 mg by mouth daily.   Yes [provider]  fluticasone (FLONASE) 50 MCG/ACT nasal spray SPRAY 2 SPRAYS INTO EACH NOSTRIL EVERY DAY Patient taking differently: Place 2 sprays into both nostrils daily. 04/25/21  Yes Mast, [name], NP  furosemide (LASIX) 80 MG tablet Take 1 tablet by mouth once daily 04/10/22  Yes Mast, [name], NP  ketoconazole (NIZORAL) 2 % shampoo Apply 1 application topically once a week.    Yes [provider]  lansoprazole (PREVACID) 30 MG capsule TAKE 1  CAPSULE BY MOUTH ONCE DAILY AT NOON Patient taking differently: Take 30 mg by mouth daily at 12 noon. TAKE 1 CAPSULE BY MOUTH ONCE DAILY AT NOON 03/13/22  Yes Mast, [name], NP  MELATONIN PO Take 10 mg by mouth daily.  Yes [provider]  montelukast (SINGULAIR) 10 MG tablet TAKE 1 TABLET BY MOUTH AT BEDTIME Patient taking differently: Take 10 mg by mouth at bedtime. 05/15/22  Yes Mast, [name], NP  potassium chloride (KLOR-CON) 10 MEQ tablet TAKE 2  BY MOUTH TWICE DAILY Patient taking differently: 20 mEq 2 (two) times daily. TAKE 2  BY MOUTH TWICE DAILY 12/13/21  Yes Mast, [name], NP  predniSONE (DELTASONE) 10 MG tablet Take 1 tablet by mouth once daily with breakfast 02/06/22  Yes Mast, [name], NP  traMADol (ULTRAM) 50 MG tablet TAKE 1 TABLET BY MOUTH 4 TIMES DAILY. 12/15/21  Yes Mast, [name], NP  vitamin B-12 (CYANOCOBALAMIN) 1000 MCG tablet Take 1 tablet (1,000 mcg total) by mouth daily. 03/31/22  Yes Mast, [name], NP  diclofenac Sodium (VOLTAREN) 1 % GEL APPLY 2 TO 4 GRAMS TOPICALLY TO AFFECTED JOINTS UP TO 4 TIMES DAILY Patient not taking: Reported on 05/21/2022 12/17/20   Ofilia Neas, PA-C  Syringe/Needle, Disp, (SYRINGE 3CC/25GX1") 25G X 1" 3 ML MISC 1 application by Does not apply route every 30 (thirty) days. 03/04/21   Mast, [name], NP     Critical care time: 43 min [id number]

## 2022-05-22 NOTE — Progress Notes (Signed)
NAME:  Eileen Wilkerson, MRN:  [id number], DOB:  [birth year], LOS: 1 ADMISSION DATE:  05/21/2022, CONSULTATION DATE:  05/22/22 REFERRING MD:  TRH, CHIEF COMPLAINT:  shock   History of Present Illness:  88 yo female with pmh asthma, ddd, ra, fibromyalgia presented to [facility] ED with diarrhea that began a day prior. She denied fever, blood, no n/v but endorses pain in her abdomen until she presented (she states this is improved now). She denies any sick contacts, food triggers, any other associated symptoms. She was admitted to [facility] for management of uti and diarrhea when overnight the pt became more hypotensive and ultimately req initiation of peripheral dose pressors.   CCM was thus consulted.   Upon eval pt was resting comfortably in icu. She is arousable and oriented when awoken but does state she is sleepy (exam at [facility]). She states she feels better overall and has not had more diarrhea since she presented. She states that she had no dizziness, palpitations etc. Pt does take chronic steroids and is unsure when she took her last dose, these were not increased when she presented with sepsis.   Pertinent  Medical History  DDD Asthma RA Fibromyalgia   Significant Hospital Events: Including procedures, antibiotic start and stop dates in addition to other pertinent events   6/11 admitted to [facility] 6/12 ccm consulted for shock, pressor requirement down-trending   Interim History / Subjective:  Seen after admission and both mental status and shock physiology improving, down to Levophed 52mg  Objective   Blood pressure (!) 150/67, pulse 66, temperature 98.1 F (36.7 C), temperature source Oral, resp. rate 14, height '5\' 3"'$  (1.6 m), weight 80.1 kg, SpO2 99 %.        Intake/Output Summary (Last 24 hours) at 05/22/2022 0754 Last data filed at 05/22/2022 [id number] per 24 hour  Intake 4270.29 ml  Output 851 ml  Net 3419.29 ml    Filed Weights   05/21/22 1204 05/21/22 2200  Weight: 81.6 kg 80.1  kg    General:  ill-appearing, elderly F in no acute distress HEENT: MM pink/moist, sclera anicteric, pupils equal Neuro: sleeping but arousable to voice and oriented x3, following commands, no focal deficits CV: s1s2 rrr, no m/r/g PULM:  clear bilaterally on [place], good air movement and no increased WOB GI: soft, bsx4 active, non-tender and non-distended Extremities: warm/dry,   no edema  Skin: no rashes or lesions   Resolved Hospital Problem list     Assessment & Plan:    Septic shock secondary to UTI and E. Coli bacteremia Diarrhea Off Levophed and oxygen with improved mental status after admission -received two doses hydrocortisone, when able to take po continue home prednisone, cortisol level >100 -follow up cx -Levo prn to maintain MAP >65 -narrow Cefepime to ceftriaxone, continue Flagyl pending stool studies  -will give 2 doses of hydrocortisone in light of chronic steroid use and current shock -will increase home PO prednisone in am after these two doses as hopefully she will be a bit more brisk and can safely take po.  -received IVF, lactate clearing  -echo pending   Chronic HFpEF -not in acute exacerbation -on lasix at home. Will likely need some diuresis when shock improved -echo pending for function  RA Fibromyalgia -resume pain meds as able avoiding nsaids in light of aki -resume home prednisone   Hyperlipidemia -cont home meds  CKD3a with aki 850cc UOP Creatinine 1.28 today -follow renal indices, avoid nephrotoxins and ensure perfusion -has purewick,  may need foley  Hypomagnesemia, hypokalemia -monitor and replete as needed   Best Practice (right click and "Reselect all SmartList Selections" daily)   Diet/type: advance as tolerated DVT prophylaxis: SCD GI prophylaxis: PPI Lines: N/A Foley:  N/A Code Status:  limited Last date of multidisciplinary goals of care discussion: family updated overnight pending for today  Labs   CBC: Recent Labs   Lab 05/21/22 1205 05/22/22 0121  WBC 15.2* 21.5*  NEUTROABS 11.6*  --   HGB 13.3 11.5*  HCT 41.5 36.2  MCV 98.8 102.8*  PLT 206 149*     Basic Metabolic Panel: Recent Labs  Lab 05/21/22 1205 05/22/22 0121  NA 140 144  K 3.1* 3.3*  CL 102 106  CO2 28 27  GLUCOSE 129* 129*  BUN 22 18  CREATININE 1.08* 1.28*  CALCIUM 9.2 8.4*  MG 2.1 1.5*    GFR: Estimated Creatinine Clearance: 31 mL/min (A) (by C-G formula based on SCr of 1.28 mg/dL (H)). Recent Labs  Lab 05/21/22 1205 05/21/22 1215 05/21/22 1825 05/21/22 2115 05/22/22 0121  PROCALCITON  --   --   --   --  110.35  WBC 15.2*  --   --   --  21.5*  LATICACIDVEN  --  2.4* 3.8* 2.2* 2.5*     Liver Function Tests: Recent Labs  Lab 05/21/22 1205 05/22/22 0121  AST 24 30  ALT 23 22  ALKPHOS 56 69  BILITOT 1.2 1.2  PROT 7.3 5.8*  ALBUMIN 3.4* 2.7*    Recent Labs  Lab 05/21/22 1205  LIPASE 22    No results for input(s): "AMMONIA" in the last 168 hours.  ABG    Component Value Date/Time   PHART 7.37 05/21/2022 1606   PCO2ART 41 05/21/2022 1606   PO2ART 71 (L) 05/21/2022 1606   HCO3 23.7 05/21/2022 1606   ACIDBASEDEF 1.5 05/21/2022 1606   O2SAT 95.6 05/21/2022 1606     Coagulation Profile: Recent Labs  Lab 05/22/22 0121  INR 1.2    Cardiac Enzymes: No results for input(s): "CKTOTAL", "CKMB", "CKMBINDEX", "TROPONINI" in the last 168 hours.  HbA1C: No results found for: "HGBA1C"  CBG: Recent Labs  Lab 05/21/22 1727  GLUCAP 167*     Review of Systems:   As per HPI  Past Medical History:  She,  has a past medical history of Adrenal failure ([place]), Arthritis, Asthma, Cancer ([place]), Cataract, Closed nondisplaced fracture of fifth right metatarsal bone (09/18/2017), Diverticulitis, Fibromyalgia (2008), HCAP (healthcare-associated pneumonia) (02/03/2018), Osteoporosis, RA (rheumatoid arthritis) ([place]), Recurrent upper respiratory infection ([place]), Sepsis due to urinary tract infection ([place])  (01/18/2018), and Urticaria.   Surgical History:   Past Surgical History:  Procedure Laterality Date   BACK SURGERY     BILATERAL CARPAL TUNNEL RELEASE  2005   right and left   CATARACT EXTRACTION, BILATERAL  2004   right and left   CERVICAL FUSION  2011,2010,2008   2 disks   HEEL SPUR SURGERY  2004   lower back fusion  2011   Fusion of 3-4 and 4-5 lower back   RADIOFREQUENCY ABLATION  2020   ROTATOR CUFF REPAIR  [id number]   SQUAMOUS CELL CARCINOMA EXCISION     TONSILLECTOMY AND ADENOIDECTOMY  1947   TOTAL SHOULDER ARTHROPLASTY       Social History:   reports that she quit smoking about 55 years ago. Her smoking use included cigarettes. She has a 7.50 pack-year smoking history. She has never used smokeless tobacco. She reports that  she does not drink alcohol and does not use drugs.   Family History:  Her family history includes Breast cancer (age of onset: 67) in her mother; Congestive Heart Failure (age of onset: 26) in her father; Diabetes in her father; Heart attack in her maternal grandmother and paternal grandfather; Heart disease in her father. There is no history of Allergic rhinitis, Asthma, Eczema, or Urticaria.   Allergies Allergies  Allergen Reactions   Adhesive [Tape] Rash   Celebrex [Celecoxib] Hives   Ciprofibrate Nausea Only   Cymbalta [Duloxetine Hcl] Swelling   Gabitril [Tiagabine] Swelling   Keflex [Cephalexin] Nausea And Vomiting   Lyrica [Pregabalin] Swelling   Neurontin [Gabapentin] Swelling   Nexium [Esomeprazole] Rash   Nsaids Rash   Penicillins Rash    Injection site reaction. Tolerated cefepime in past Has patient had a PCN reaction causing immediate rash, facial/tongue/throat swelling, SOB or lightheadedness with hypotension: No Has patient had a PCN reaction causing severe rash involving mucus membranes or skin necrosis: No Has patient had a PCN reaction that required hospitalization No Has patient had a PCN reaction occurring within the last  10 years: No If all of the above answers are "NO", then may proceed with Cephalosporin use.    Shrimp [Shellfish Allergy] Anaphylaxis    Per patient "shrimp only"   Sulfa Antibiotics Nausea And Vomiting   Azactam [Aztreonam]     Hand swelling    Azelastine Hcl     Rash    Ciprofloxacin Other (See Comments)    dizziness   Claritin [Loratadine]     Irritability Nervousness    Methotrexate Derivatives    Nasacort [Triamcinolone]     Dizzy    Olopatadine Other (See Comments)    Pain and lethargy    Other    Sulfamethizole Other (See Comments)    unknown   Zantac [Ranitidine Hcl] Other (See Comments)    unknown   Claritin-D 12 Hour [Loratadine-Pseudoephedrine Er] Anxiety     Home Medications  Prior to Admission medications   Medication Sig Start Date End Date Taking? Authorizing Provider  acetaminophen (TYLENOL) 500 MG tablet Take 500 mg by mouth every 6 (six) hours.    Yes [provider]  atorvastatin (LIPITOR) 20 MG tablet TAKE 1 TABLET BY MOUTH EVERY DAY 02/06/22  Yes Mast, [name], NP  Biotin 10 MG TABS Take 10 mg by mouth daily.   Yes [provider]  Biotin 5 MG TBDP Take 5,000 mcg by mouth daily. 03/15/22  Yes [provider]  Cholecalciferol (VITAMIN D3) 5000 units CAPS Take 5,000 Units by mouth daily.   Yes [provider]  CRANBERRY PO Take 1 capsule by mouth daily.   Yes [provider]  cycloSPORINE (RESTASIS) 0.05 % ophthalmic emulsion Place 1 drop into both eyes 2 (two) times daily.    Yes [provider]  fexofenadine (ALLEGRA) 180 MG tablet Take 180 mg by mouth daily.   Yes [provider]  fluticasone (FLONASE) 50 MCG/ACT nasal spray SPRAY 2 SPRAYS INTO EACH NOSTRIL EVERY DAY Patient taking differently: Place 2 sprays into both nostrils daily. 04/25/21  Yes Mast, [name], NP  furosemide (LASIX) 80 MG tablet Take 1 tablet by mouth once daily 04/10/22  Yes Mast, [name], NP  ketoconazole (NIZORAL) 2 % shampoo Apply  1 application topically once a week.    Yes [provider]  lansoprazole (PREVACID) 30 MG capsule TAKE 1 CAPSULE BY MOUTH ONCE DAILY AT NOON Patient taking  differently: Take 30 mg by mouth daily at 12 noon. TAKE 1 CAPSULE BY MOUTH ONCE DAILY AT NOON 03/13/22  Yes Mast, [name], NP  MELATONIN PO Take 10 mg by mouth daily.    Yes [provider]  montelukast (SINGULAIR) 10 MG tablet TAKE 1 TABLET BY MOUTH AT BEDTIME Patient taking differently: Take 10 mg by mouth at bedtime. 05/15/22  Yes Mast, [name], NP  potassium chloride (KLOR-CON) 10 MEQ tablet TAKE 2  BY MOUTH TWICE DAILY Patient taking differently: 20 mEq 2 (two) times daily. TAKE 2  BY MOUTH TWICE DAILY 12/13/21  Yes Mast, [name], NP  predniSONE (DELTASONE) 10 MG tablet Take 1 tablet by mouth once daily with breakfast 02/06/22  Yes Mast, [name], NP  traMADol (ULTRAM) 50 MG tablet TAKE 1 TABLET BY MOUTH 4 TIMES DAILY. 12/15/21  Yes Mast, [name], NP  vitamin B-12 (CYANOCOBALAMIN) 1000 MCG tablet Take 1 tablet (1,000 mcg total) by mouth daily. 03/31/22  Yes Mast, [name], NP  diclofenac Sodium (VOLTAREN) 1 % GEL APPLY 2 TO 4 GRAMS TOPICALLY TO AFFECTED JOINTS UP TO 4 TIMES DAILY Patient not taking: Reported on 05/21/2022 12/17/20   Ofilia Neas, PA-C  Syringe/Needle, Disp, (SYRINGE 3CC/25GX1") 25G X 1" 3 ML MISC 1 application by Does not apply route every 30 (thirty) days. 03/04/21   Mast, [name], NP     Critical care time: additional 35 minutes     CRITICAL CARE Performed by: Otilio Carpen Ralphael [place]   Total critical care time: 35 minutes  Critical care time was exclusive of separately billable procedures and treating other patients.  Critical care was necessary to treat or prevent imminent or life-threatening deterioration.  Critical care was time spent personally by me on the following activities: development of treatment plan with patient and/or surrogate as well as nursing, discussions with consultants, evaluation of patient's response to  treatment, examination of patient, obtaining history from patient or surrogate, ordering and performing treatments and interventions, ordering and review of laboratory studies, ordering and review of radiographic studies, pulse oximetry and re-evaluation of patient's condition.  Otilio Carpen March Joos, PA-C [facility] See Amion for pager If no response to pager , please call [phone] until 7pm After 7:00 pm call Elink  [phone]

## 2022-05-22 NOTE — Progress Notes (Signed)
Pt's daughter called and updated, pt remains off pressors and is stable to transfer out of ICU.  Otilio Carpen Landyn Buckalew, PA-C

## 2022-05-22 NOTE — Progress Notes (Signed)
PHARMACY - PHYSICIAN COMMUNICATION CRITICAL VALUE ALERT - BLOOD CULTURE IDENTIFICATION (BCID)  Eileen Wilkerson is an 88 y.o. female who presented to [facility] on 05/21/2022   Assessment: 2/4 bottles (both sets) E.coli, no resistance Febrile, Tmax 105 Procalcitonin 110 (SCr slight trend up)  Name of physician (or Provider) Contacted:  Mickel Baas Gleason, PA  Dr. Chase Caller  Current antibiotics: cefepime, metronidazole  Changes to prescribed antibiotics recommended:  Change cefepime to ceftriaxone; continue metronidazole for now  Results for orders placed or performed during the hospital encounter of 05/21/22  Blood Culture ID Panel (Reflexed) (Collected: 05/21/2022 12:15 PM)  Result Value Ref Range   Enterococcus faecalis NOT DETECTED NOT DETECTED   Enterococcus Faecium NOT DETECTED NOT DETECTED   Listeria monocytogenes NOT DETECTED NOT DETECTED   Staphylococcus species NOT DETECTED NOT DETECTED   Staphylococcus aureus (BCID) NOT DETECTED NOT DETECTED   Staphylococcus epidermidis NOT DETECTED NOT DETECTED   Staphylococcus lugdunensis NOT DETECTED NOT DETECTED   Streptococcus species NOT DETECTED NOT DETECTED   Streptococcus agalactiae NOT DETECTED NOT DETECTED   Streptococcus pneumoniae NOT DETECTED NOT DETECTED   Streptococcus pyogenes NOT DETECTED NOT DETECTED   A.calcoaceticus-baumannii NOT DETECTED NOT DETECTED   Bacteroides fragilis NOT DETECTED NOT DETECTED   Enterobacterales DETECTED (A) NOT DETECTED   Enterobacter cloacae complex NOT DETECTED NOT DETECTED   Escherichia coli DETECTED (A) NOT DETECTED   Klebsiella aerogenes NOT DETECTED NOT DETECTED   Klebsiella oxytoca NOT DETECTED NOT DETECTED   Klebsiella pneumoniae NOT DETECTED NOT DETECTED   Proteus species NOT DETECTED NOT DETECTED   Salmonella species NOT DETECTED NOT DETECTED   Serratia marcescens NOT DETECTED NOT DETECTED   Haemophilus influenzae NOT DETECTED NOT DETECTED   Neisseria meningitidis NOT DETECTED  NOT DETECTED   Pseudomonas aeruginosa NOT DETECTED NOT DETECTED   Stenotrophomonas maltophilia NOT DETECTED NOT DETECTED   Candida albicans NOT DETECTED NOT DETECTED   Candida auris NOT DETECTED NOT DETECTED   Candida glabrata NOT DETECTED NOT DETECTED   Candida krusei NOT DETECTED NOT DETECTED   Candida parapsilosis NOT DETECTED NOT DETECTED   Candida tropicalis NOT DETECTED NOT DETECTED   Cryptococcus neoformans/gattii NOT DETECTED NOT DETECTED   CTX-M ESBL NOT DETECTED NOT DETECTED   Carbapenem resistance IMP NOT DETECTED NOT DETECTED   Carbapenem resistance KPC NOT DETECTED NOT DETECTED   Carbapenem resistance NDM NOT DETECTED NOT DETECTED   Carbapenem resist OXA 48 LIKE NOT DETECTED NOT DETECTED   Carbapenem resistance VIM NOT DETECTED NOT DETECTED    Tawnya Crook, PharmD, BCPS Clinical Pharmacist 05/22/2022 10:51 AM

## 2022-05-22 NOTE — Evaluation (Signed)
Physical Therapy Evaluation Patient Details Name: Eileen Wilkerson MRN: [id number] DOB: [birth year] Today's Date: 05/22/2022  History of Present Illness  88 yo female admitted with UTI, sepsis, diarrhea. Hx of RA, fibromyalgia, hypothyroidism  Clinical Impression  Bed level eval only on today. Pt is very drowsy-she had difficulty remaining alert during history taking. No family present during session. Pt was able to participate enough to allow assessment of her UE/LEs. Discussed PLOF and d/c plan-pt prefers to return home once medically stable. She lives in [place] at [facility]. Will plan to follow and progress activity as tolerated.        Recommendations for follow up therapy are one component of a multi-disciplinary discharge planning process, led by the attending physician.  Recommendations may be updated based on patient status, additional functional criteria and insurance authorization.  Follow Up Recommendations Skilled nursing-short term rehab (<3 hours/day) (vs HHPT-depending on progress. Pt prefers to return home). Will update recs as needed.    Assistance Recommended at Discharge PRN  Patient can return home with the following  Assist for transportation;Assistance with cooking/housework    Equipment Recommendations None recommended by PT  Recommendations for Other Services       Functional Status Assessment Patient has had a recent decline in their functional status and demonstrates the ability to make significant improvements in function in a reasonable and predictable amount of time.     Precautions / Restrictions Precautions Precautions: Fall Restrictions Weight Bearing Restrictions: No      Mobility  Bed Mobility               General bed mobility comments: NT-too drowsy. Also BP soft 98/44    Transfers                        Ambulation/Gait                  Stairs            Wheelchair Mobility    Modified Rankin  (Stroke Patients Only)       Balance                                             Pertinent Vitals/Pain Pain Assessment Pain Assessment: Faces Faces Pain Scale: No hurt    Home Living Family/patient expects to be discharged to:: Private residence Living Arrangements: Alone Available Help at Discharge: Family;Personal care attendant;Available PRN/intermittently (aide 2x/week) Type of Home: Independent living facility Home Access: Level entry       Home Layout: One level Home Equipment: Rollator (4 wheels)      Prior Function Prior Level of Function : Needs assist             Mobility Comments: uses rollator for household distances ADLs Comments: performs ADLs unassisted. help for homemaking, meals, transportation     Hand Dominance        Extremity/Trunk Assessment   Upper Extremity Assessment Upper Extremity Assessment: Defer to OT evaluation    Lower Extremity Assessment Lower Extremity Assessment: Generalized weakness    Cervical / Trunk Assessment Cervical / Trunk Assessment: Normal  Communication   Communication: No difficulties  Cognition Arousal/Alertness: Awake/alert Behavior During Therapy: WFL for tasks assessed/performed Overall Cognitive Status: Within Functional Limits for tasks assessed  General Comments: very drowsy-difficulty remaining alert for history taking        General Comments      Exercises     Assessment/Plan    PT Assessment Patient needs continued PT services  PT Problem List Decreased strength;Decreased mobility;Decreased activity tolerance;Decreased balance;Decreased knowledge of use of DME;Decreased range of motion;Pain       PT Treatment Interventions DME instruction;Therapeutic activities;Therapeutic exercise;Gait training;Patient/family education;Balance training;Functional mobility training    PT Goals (Current goals can be found in the Care  Plan section)  Acute Rehab PT Goals Patient Stated Goal: to return home once medically stable PT Goal Formulation: With patient Time For Goal Achievement: 06/05/22 Potential to Achieve Goals: Good    Frequency Min 3X/week     Co-evaluation               AM-PAC PT "6 Clicks" Mobility  Outcome Measure Help needed turning from your back to your side while in a flat bed without using bedrails?: Total Help needed moving from lying on your back to sitting on the side of a flat bed without using bedrails?: Total Help needed moving to and from a bed to a chair (including a wheelchair)?: Total Help needed standing up from a chair using your arms (e.g., wheelchair or bedside chair)?: Total Help needed to walk in hospital room?: Total Help needed climbing 3-5 steps with a railing? : Total 6 Click Score: 6    End of Session   Activity Tolerance: Patient limited by fatigue Patient left: in bed;with call bell/phone within reach;with bed alarm set   PT Visit Diagnosis: Muscle weakness (generalized) (M62.81);Difficulty in walking, not elsewhere classified (R26.2)    Time: 1452-1500 PT Time Calculation (min) (ACUTE ONLY): 8 min   Charges:   PT Evaluation $PT Eval Moderate Complexity: 1 Mod             Doreatha Massed, PT Acute Rehabilitation  Office: [phone] Pager: [phone]

## 2022-05-22 NOTE — Progress Notes (Signed)
Attempted to call daughter earlier of transfer but unable to reach her til now.  Informed her of visiting hours and mom's current location.

## 2022-05-23 DIAGNOSIS — M0579 Rheumatoid arthritis with rheumatoid factor of multiple sites without organ or systems involvement: Secondary | ICD-10-CM | POA: Diagnosis not present

## 2022-05-23 DIAGNOSIS — R7881 Bacteremia: Secondary | ICD-10-CM | POA: Diagnosis not present

## 2022-05-23 DIAGNOSIS — Z7952 Long term (current) use of systemic steroids: Secondary | ICD-10-CM

## 2022-05-23 DIAGNOSIS — R197 Diarrhea, unspecified: Secondary | ICD-10-CM

## 2022-05-23 DIAGNOSIS — I959 Hypotension, unspecified: Secondary | ICD-10-CM

## 2022-05-23 LAB — MAGNESIUM: Magnesium: 2.2 mg/dL (ref 1.7–2.4)

## 2022-05-23 LAB — COMPREHENSIVE METABOLIC PANEL
ALT: 18 U/L (ref 0–44)
AST: 23 U/L (ref 15–41)
Albumin: 3.1 g/dL — ABNORMAL LOW (ref 3.5–5.0)
Alkaline Phosphatase: 54 U/L (ref 38–126)
Anion gap: 8 (ref 5–15)
BUN: 18 mg/dL (ref 8–23)
CO2: 23 mmol/L (ref 22–32)
Calcium: 8.2 mg/dL — ABNORMAL LOW (ref 8.9–10.3)
Chloride: 111 mmol/L (ref 98–111)
Creatinine, Ser: 1.05 mg/dL — ABNORMAL HIGH (ref 0.44–1.00)
GFR, Estimated: 51 mL/min — ABNORMAL LOW (ref >60)
Glucose, Bld: 133 mg/dL — ABNORMAL HIGH (ref 70–99)
Potassium: 2.9 mmol/L — ABNORMAL LOW (ref 3.5–5.1)
Sodium: 142 mmol/L (ref 135–145)
Total Bilirubin: 0.6 mg/dL (ref 0.3–1.2)
Total Protein: 6.1 g/dL — ABNORMAL LOW (ref 6.5–8.1)

## 2022-05-23 LAB — C DIFFICILE QUICK SCREEN W PCR REFLEX
C Diff antigen: NEGATIVE
C Diff interpretation: NOT DETECTED
C Diff toxin: NEGATIVE

## 2022-05-23 LAB — LACTIC ACID, PLASMA: Lactic Acid, Venous: 1.2 mmol/L (ref 0.5–1.9)

## 2022-05-23 LAB — CBC
HCT: 33 % — ABNORMAL LOW (ref 36.0–46.0)
Hemoglobin: 10.2 g/dL — ABNORMAL LOW (ref 12.0–15.0)
MCH: 31.6 pg (ref 26.0–34.0)
MCHC: 30.9 g/dL (ref 30.0–36.0)
MCV: 102.2 fL — ABNORMAL HIGH (ref 80.0–100.0)
Platelets: 135 10*3/uL — ABNORMAL LOW (ref 150–400)
RBC: 3.23 MIL/uL — ABNORMAL LOW (ref 3.87–5.11)
RDW: 14.5 % (ref 11.5–15.5)
WBC: 14.9 10*3/uL — ABNORMAL HIGH (ref 4.0–10.5)
nRBC: 0 % (ref 0.0–0.2)

## 2022-05-23 MED ORDER — ENSURE ENLIVE PO LIQD
237.0000 mL | Freq: Two times a day (BID) | ORAL | Status: DC
  Administered 2022-05-23 – 2022-05-25 (×4): 237 mL via ORAL

## 2022-05-23 MED ORDER — POTASSIUM CHLORIDE CRYS ER 20 MEQ PO TBCR
40.0000 meq | EXTENDED_RELEASE_TABLET | Freq: Once | ORAL | Status: AC
Start: 2022-05-23 — End: 2022-05-23
  Administered 2022-05-23: 40 meq via ORAL
  Filled 2022-05-23: qty 2

## 2022-05-23 MED ORDER — ADULT MULTIVITAMIN W/MINERALS CH
1.0000 | ORAL_TABLET | Freq: Every day | ORAL | Status: DC
  Administered 2022-05-23 – 2022-05-30 (×8): 1 via ORAL
  Filled 2022-05-23 (×8): qty 1

## 2022-05-23 MED ORDER — POTASSIUM CHLORIDE CRYS ER 20 MEQ PO TBCR
30.0000 meq | EXTENDED_RELEASE_TABLET | ORAL | Status: AC
  Administered 2022-05-23 (×2): 30 meq via ORAL
  Filled 2022-05-23 (×2): qty 1

## 2022-05-23 NOTE — Progress Notes (Signed)
[facility]  PROGRESS NOTE  Eileen Wilkerson HGD:[id number] DOB: 20-Nov-1932 DOA: 05/21/2022 PCP: Mast, [name], NP   Brief HPI:   88 year old female with past medical history of asthma, DDD, remote arthritis, fibromyalgia came to ED with complaints of diarrhea.  Denied fever, no nausea vomiting.  Patient became more hypotensive and required initiation of peripheral dose vasopressors so she was transferred to ICU.  Patient was found to have E. coli bacteremia due to UTI.  Pressors were weaned off.  Patient was started on antibiotics.   Subjective   Patient seen and examined, denies any complaints.  Had 1 loose BM last night.   Assessment/Plan:    Septic shock due to UTI/E. coli bacteremia/diarrhea -Resolved -Patient has been weaned off Levophed -Patient received 2 doses of hydrocortisone and started on prednisone at her home dose -Initially started on cefepime which has been narrowed down to ceftriaxone and Flagyl -Follow GI pathogen panel  E. coli bacteremia -Secondary to UTI, urine culture growing E. coli -Blood cultures x2 growing E. Coli -Patient was initially on cefepime which has been transitioned to ceftriaxone -Follow blood culture results  Chronic HFpEF -Currently euvolemic -Lasix on hold -Echocardiogram showed EF of 60 to 65%, no regional wall motion abnormalities.  Grade 1 diastolic dysfunction.  Rheumatoid arthritis/fibromyalgia -Continue p.o. prednisone  CKD stage IIIa -Creatinine 1.05, at baseline  Hypokalemia -Potassium 2.9, secondary to diarrhea -Serum magnesium 2.2 -Replace potassium and follow BMP in am  Diarrhea -Improving, stool for C. difficile PCR currently pending -GI pathogen panel obtained   Medications     atorvastatin  20 mg Oral Daily   Chlorhexidine Gluconate Cloth  6 each Topical Daily   cycloSPORINE  1 drop Both Eyes BID   fluticasone  2 spray Each Nare Daily   mouth rinse  15 mL Mouth Rinse BID   montelukast  10 mg Oral  QHS   pantoprazole  40 mg Oral Daily   predniSONE  10 mg Oral Q breakfast   traMADol  50 mg Oral QID     Data Reviewed:   CBG:  Recent Labs  Lab 05/21/22 1727  GLUCAP 167*    SpO2: 92 % O2 Flow Rate (L/min): 1 L/min    Vitals:   05/23/22 0700 05/23/22 0800 05/23/22 0812 05/23/22 1258  BP:  (!) 124/55    Pulse: (!) 57 70    Resp: 12 19    Temp:   98.1 F (36.7 C) 98 F (36.7 C)  TempSrc:   Oral Oral  SpO2: (!) 88% 92%    Weight:      Height:          Data Reviewed:  Basic Metabolic Panel: Recent Labs  Lab 05/21/22 1205 05/22/22 0121 05/22/22 1326 05/23/22 0304 05/23/22 0528  NA 140 144 143 142  --   K 3.1* 3.3* 4.0 2.9*  --   CL 102 106 109 111  --   CO2 '28 27 25 23  '$ --   GLUCOSE 129* 129* 161* 133*  --   BUN '22 18 16 18  '$ --   CREATININE 1.08* 1.28* 1.01* 1.05*  --   CALCIUM 9.2 8.4* 8.6* 8.2*  --   MG 2.1 1.5*  --   --  2.2    CBC: Recent Labs  Lab 05/21/22 1205 05/22/22 0121 05/23/22 0304  WBC 15.2* 21.5* 14.9*  NEUTROABS 11.6*  --   --   HGB 13.3 11.5* 10.2*  HCT 41.5 36.2 33.0*  MCV  98.8 102.8* 102.2*  PLT 206 149* 135*    LFT Recent Labs  Lab 05/21/22 1205 05/22/22 0121 05/23/22 0304  AST '24 30 23  '$ ALT '23 22 18  '$ ALKPHOS 56 69 54  BILITOT 1.2 1.2 0.6  PROT 7.3 5.8* 6.1*  ALBUMIN 3.4* 2.7* 3.1*     Antibiotics: Anti-infectives (From admission, onward)    Start     Dose/Rate Route Frequency Ordered Stop   05/22/22 2100  cefTRIAXone (ROCEPHIN) 2 g in sodium chloride 0.9 % 100 mL IVPB        2 g 200 mL/hr over 30 Minutes Intravenous Every 24 hours 05/22/22 0938     05/21/22 2200  metroNIDAZOLE (FLAGYL) IVPB 500 mg        500 mg 100 mL/hr over 60 Minutes Intravenous Every 12 hours 05/21/22 1743 05/28/22 2159   05/21/22 2200  ceFEPIme (MAXIPIME) 2 g in sodium chloride 0.9 % 100 mL IVPB  Status:  Discontinued        2 g 200 mL/hr over 30 Minutes Intravenous Every 12 hours 05/21/22 1753 05/22/22 0938   05/21/22 1400   ceFEPIme (MAXIPIME) 2 g in sodium chloride 0.9 % 100 mL IVPB       See Hyperspace for full Linked Orders Report.   2 g 200 mL/hr over 30 Minutes Intravenous  Once 05/21/22 1346 05/21/22 1526   05/21/22 1400  metroNIDAZOLE (FLAGYL) IVPB 500 mg       See Hyperspace for full Linked Orders Report.   500 mg 100 mL/hr over 60 Minutes Intravenous  Once 05/21/22 1346 05/21/22 1531        DVT prophylaxis: SCDs  Code Status: Full code  Family Communication: No family at bedside   CONSULTS PCCM   Objective    Physical Examination:   General-appears in no acute distress Heart-S1-S2, regular, no murmur auscultated Lungs-clear to auscultation bilaterally, no wheezing or crackles auscultated Abdomen-soft, nontender, no organomegaly Extremities-no edema in the lower extremities Neuro-alert, oriented x3, no focal deficit noted   Status is: Inpatient:        Oswald Hillock   [facility] If 7PM-7AM, please contact night-coverage at [email or web address], Office  [phone]   05/23/2022, 1:07 PM  LOS: 2 days

## 2022-05-23 NOTE — Plan of Care (Signed)
Discussed with patient and family member on the phone plan of care for the evening and improving health with some teach back displayed.    Problem: Education: Goal: Knowledge of General Education information will improve Description: Including pain rating scale, medication(s)/side effects and non-pharmacologic comfort measures Outcome: Progressing   Problem: Health Behavior/Discharge Planning: Goal: Ability to manage health-related needs will improve Outcome: Progressing

## 2022-05-23 NOTE — TOC Initial Note (Signed)
Transition of Care [facility]) - Initial/Assessment Note    Patient Details  Name: Eileen Wilkerson MRN: [id number] Date of Birth: [birth year]  Transition of Care [facility]) CM/SW Contact:    Leeroy Cha, RN Phone Number: 05/23/2022, 7:36 AM  Clinical Narrative:                 Patient will need snf placement is from friend home will do fl2  and sent to [facility] home snf  Expected Discharge Plan: Skilled Nursing Facility Barriers to Discharge: Continued Medical Work up   Patient Goals and CMS Choice Patient states their goals for this hospitalization and ongoing recovery are:: to go back home CMS Medicare.gov Compare Post Acute Care list provided to:: Patient    Expected Discharge Plan and Services Expected Discharge Plan: [place]   Discharge Planning Services: CM Consult   Living arrangements for the past 2 months: Apartment                                      Prior Living Arrangements/Services Living arrangements for the past 2 months: Apartment Lives with:: Self (lives at [facility]) Patient language and need for interpreter reviewed:: Yes Do you feel safe going back to the place where you live?: Yes      Need for Family Participation in Patient Care: Yes (Comment) (daughter) Care giver support system in place?: Yes (comment) (friends home [place])   Criminal Activity/Legal Involvement Pertinent to Current Situation/Hospitalization: No - Comment as needed  Activities of Daily Living Home Assistive Devices/Equipment: None ADL Screening (condition at time of admission) Patient's cognitive ability adequate to safely complete daily activities?: No Is the patient deaf or have difficulty hearing?: Yes Does the patient have difficulty seeing, even when wearing glasses/contacts?: No Does the patient have difficulty concentrating, remembering, or making decisions?: No Patient able to express need for assistance with ADLs?: Yes Does the  patient have difficulty dressing or bathing?: Yes Independently performs ADLs?: No Communication: Independent Dressing (OT): Dependent Is this a change from baseline?: Change from baseline, expected to last >3 days Grooming: Dependent Is this a change from baseline?: Change from baseline, expected to last >3 days Feeding: Needs assistance, Dependent Is this a change from baseline?: Change from baseline, expected to last >3 days Bathing: Dependent Is this a change from baseline?: Change from baseline, expected to last >3 days Toileting: Dependent Is this a change from baseline?: Change from baseline, expected to last >3days In/Out Bed: Dependent Is this a change from baseline?: Change from baseline, expected to last >3 days Walks in Home: Needs assistance, Dependent Is this a change from baseline?: Change from baseline, expected to last >3 days Does the patient have difficulty walking or climbing stairs?: Yes Weakness of Legs: Both Weakness of Arms/Hands: Both  Permission Sought/Granted                  Emotional Assessment Appearance:: Appears stated age Attitude/Demeanor/Rapport: Engaged Affect (typically observed): Calm Orientation: : Oriented to Self, Oriented to Place, Oriented to  Time, Oriented to Situation Alcohol / Substance Use: Not Applicable, Never Used Psych Involvement: No (comment)  Admission diagnosis:  Sepsis associated hypotension ([place]) [A41.9, I95.9] Acute cystitis without hematuria [N30.00] Sepsis ([place]) [A41.9] Patient Active Problem List   Diagnosis Date Noted   E coli bacteremia    Sepsis ([place]) 05/21/2022   Diarrhea 05/21/2022   Gait abnormality 01/26/2022  Sinus tachycardia 10/27/2021   Weight gain 10/14/2021   Hyperlipidemia 10/13/2021   Dysuria 06/23/2021   Osteopenia 06/07/2021   Adnexal cyst 03/03/2021   AK (actinic keratosis) 03/03/2021   UTI (urinary tract infection) 07/22/2020   Allergic rhinitis 07/22/2020   Asthma 07/08/2020    Perforation of sigmoid colon due to diverticulitis 06/23/2020   Hair loss 08/06/2019   SOB (shortness of breath) on exertion 08/06/2019   Pure hypercholesterolemia 08/06/2019   B12 deficiency 03/11/2018   E. coli UTI 01/18/2018   Edema of both lower extremities due to peripheral venous insufficiency 09/18/2017   Bilateral lower extremity edema 06/30/2017   Physical deconditioning 05/30/2017   Immunosuppressed status ([place]) 05/30/2017   Abnormal chest x-ray 05/17/2017   Hypokalemia 04/25/2017   Fibromyalgia 12/18/2016   DJD (degenerative joint disease), cervical 12/18/2016   Spondylosis of lumbar region without myelopathy or radiculopathy 12/18/2016   Trigger finger, right middle finger 12/18/2016   Primary osteoarthritis of both feet 12/18/2016   Primary osteoarthritis of both knees 12/18/2016   GERD (gastroesophageal reflux disease) 12/18/2016   Age-related osteoporosis without current pathological fracture 12/18/2016   Rheumatoid arthritis ([place]) 12/28/2015   Long term current use of systemic steroids, for RA 12/28/2015   Lymphocytosis 11/13/2012   PCP:  Mast, [name], NP Pharmacy:   [id number]: [phone]: [id number]: [phone]: [phone]    Social Determinants of Health (SDOH) Interventions    Readmission Risk Interventions     No data to display

## 2022-05-23 NOTE — Progress Notes (Signed)
Initial Nutrition Assessment  DOCUMENTATION CODES:   Obesity unspecified  INTERVENTION:  - will order Ensure Plus High Protein BID, each supplement provides 350 kcal and 20 grams of protein. - will order 1 tablet multivitamin with minerals/day. - liberalize diet from Heart Healthy to Regular.   NUTRITION DIAGNOSIS:   Increased nutrient needs related to acute illness as evidenced by estimated needs.  GOAL:   Patient will meet greater than or equal to 90% of their needs  MONITOR:   PO intake, Supplement acceptance, Labs, Weight trends  REASON FOR ASSESSMENT:   Malnutrition Screening Tool  ASSESSMENT:   88 year old female with medical history of asthma, DDD, arthritis, fibromyalgia, diverticulitis, and squamous cell carcinoma. Patient admitted for septic shock 2/2 UTI.  Patient sitting up in the chair with no visitors present at the time of RD visit. Patient drowsy during brief visit. Lunch ordered by staff a short time before RD visit. Patient shared that she had breakfast but did not eat much (flow sheet documentation indicates 50% intake); shares that it was a cheese omelet, fruit which she has saved for later, and breakfast potatoes. She shares she wanted ham in omelet but was unable d/t Heart Healthy diet.   She has not been seen by a [place] RD since 06/2020.   Patient denies any changes in appetite PTA.  Weight on 6/11 was 176 lb, weight on 5/24 was 180 lb (4 lb weight loss, 2.2% body weight in the past 3 weeks), and weight on 02/27/22 was 182 lb (6 lb weight loss, 3.3% body weight).  Per notes: - septic shock d/t UTI and bacteremia--resolved - diarrhea-- improving; pending GI panel and Cdiff testing - chronic heart failure--euvolemic and lasix on hold   Labs reviewed; K: 2.9 mmol/l, creatinine: 1.05 mg/dl, Ca: 8.2 mg/dl, GFR: 51 ml/min. Medications reviewed; 40 mg oral protonix/day, 40 mEq Klor-Con x1 dose 6/13, 10 mg deltasone/day.     NUTRITION - FOCUSED  PHYSICAL EXAM:  Flowsheet Row Most Recent Value  Orbital Region No depletion  Upper Arm Region No depletion  Thoracic and Lumbar Region Unable to assess  Buccal Region No depletion  Temple Region No depletion  Clavicle Bone Region No depletion  Clavicle and Acromion Bone Region No depletion  Scapular Bone Region No depletion  Dorsal Hand No depletion  Patellar Region No depletion  Anterior Thigh Region No depletion  Posterior Calf Region No depletion  Edema (RD Assessment) Mild  [BLE]  Hair Reviewed  Eyes Reviewed  Mouth Reviewed  Skin Reviewed  Nails Reviewed       Diet Order:   Diet Order             Diet regular Room service appropriate? Yes; Fluid consistency: Thin  Diet effective now                   EDUCATION NEEDS:   No education needs have been identified at this time  Skin:  Skin Assessment: Reviewed RN Assessment  Last BM:  6/13 (type 6 x1, medium amount)  Height:   Ht Readings from Last 1 Encounters:  05/21/22 '5\' 3"'$  (1.6 m)    Weight:   Wt Readings from Last 1 Encounters:  05/21/22 80.1 kg     BMI:  Body mass index is 31.28 kg/m.  Estimated Nutritional Needs:  Kcal:  1700-1900 kcal Protein:  85-100 grams Fluid:  >/= 2 L/day     Jarome Matin, MS, RD, LDN Registered Dietitian II Inpatient Clinical Nutrition RD  pager # and on-call/weekend pager # available in [facility]

## 2022-05-23 NOTE — NC FL2 (Signed)
[place] LEVEL OF CARE SCREENING TOOL     IDENTIFICATION  Patient Name: Eileen Wilkerson LEVELS Birthdate: 08-Jan-1932 Sex: female Admission Date (Current Location): 05/21/2022  [facility] and [place] Number:  Herbalist and Address:  [facility],  [place]      Provider Number: [id number]  Attending Physician Name and Address:  Oswald Hillock, MD  Relative Name and Phone Number:       Current Level of Care: Hospital Recommended Level of Care: [place] Prior Approval Number:    Date Approved/Denied:   PASRR Number: [id number]  Discharge Plan: SNF    Current Diagnoses: Patient Active Problem List   Diagnosis Date Noted   E coli bacteremia    Sepsis ([place]) 05/21/2022   Diarrhea 05/21/2022   Gait abnormality 01/26/2022   Sinus tachycardia 10/27/2021   Weight gain 10/14/2021   Hyperlipidemia 10/13/2021   Dysuria 06/23/2021   Osteopenia 06/07/2021   Adnexal cyst 03/03/2021   AK (actinic keratosis) 03/03/2021   UTI (urinary tract infection) 07/22/2020   Allergic rhinitis 07/22/2020   Asthma 07/08/2020   Perforation of sigmoid colon due to diverticulitis 06/23/2020   Hair loss 08/06/2019   SOB (shortness of breath) on exertion 08/06/2019   Pure hypercholesterolemia 08/06/2019   B12 deficiency 03/11/2018   E. coli UTI 01/18/2018   Edema of both lower extremities due to peripheral venous insufficiency 09/18/2017   Bilateral lower extremity edema 06/30/2017   Physical deconditioning 05/30/2017   Immunosuppressed status ([place]) 05/30/2017   Abnormal chest x-ray 05/17/2017   Hypokalemia 04/25/2017   Fibromyalgia 12/18/2016   DJD (degenerative joint disease), cervical 12/18/2016   Spondylosis of lumbar region without myelopathy or radiculopathy 12/18/2016   Trigger finger, right middle finger 12/18/2016   Primary osteoarthritis of both feet 12/18/2016   Primary osteoarthritis of both knees 12/18/2016    GERD (gastroesophageal reflux disease) 12/18/2016   Age-related osteoporosis without current pathological fracture 12/18/2016   Rheumatoid arthritis ([place]) 12/28/2015   Long term current use of systemic steroids, for RA 12/28/2015   Lymphocytosis 11/13/2012    Orientation RESPIRATION BLADDER Height & Weight     Self, Time, Situation, Place  Normal Continent Weight: 80.1 kg Height:  '5\' 3"'$  (160 cm)  BEHAVIORAL SYMPTOMS/MOOD NEUROLOGICAL BOWEL NUTRITION STATUS      Continent Diet (regular)  AMBULATORY STATUS COMMUNICATION OF NEEDS Skin   Extensive Assist Verbally Normal                       Personal Care Assistance Level of Assistance  Bathing, Feeding, Dressing Bathing Assistance: Limited assistance Feeding assistance: Limited assistance Dressing Assistance: Limited assistance     Functional Limitations Info  Sight, Hearing, Speech Sight Info: Adequate Hearing Info: Adequate Speech Info: Adequate    SPECIAL CARE FACTORS FREQUENCY  PT (By licensed PT), OT (By licensed OT)     PT Frequency: 5 x weekly OT Frequency: 5 x weekly            Contractures Contractures Info: Not present    Additional Factors Info  Code Status Code Status Info: partial             Current Medications (05/23/2022):  This is the current hospital active medication list Current Facility-Administered Medications  Medication Dose Route Frequency Provider Last Rate Last Admin   0.9 %  sodium chloride infusion  250 mL Intravenous Continuous Adefeso, Olad[place], DO   Stopped at 05/22/22  0110   acetaminophen (TYLENOL) tablet 650 mg  650 mg Oral Q6H PRN Marylyn Ishihara, Tyrone A, DO   650 mg at 05/22/22 2330   Or   acetaminophen (TYLENOL) suppository 650 mg  650 mg Rectal Q6H PRN Marylyn Ishihara, Tyrone A, DO       atorvastatin (LIPITOR) tablet 20 mg  20 mg Oral Daily Kyle, Tyrone A, DO   20 mg at 05/22/22 0912   cefTRIAXone (ROCEPHIN) 2 g in sodium chloride 0.9 % 100 mL IVPB  2 g Intravenous Q24H Brand Males, MD   Stopped at 05/22/22 2034   Chlorhexidine Gluconate Cloth 2 % PADS 6 each  6 each Topical Daily Kyle, Tyrone A, DO   6 each at 05/22/22 1726   cycloSPORINE (RESTASIS) 0.05 % ophthalmic emulsion 1 drop  1 drop Both Eyes BID Marylyn Ishihara, Tyrone A, DO   1 drop at 05/22/22 2132   fluticasone (FLONASE) 50 MCG/ACT nasal spray 2 spray  2 spray Each Nare Daily Kyle, Tyrone A, DO   2 spray at 05/22/22 0919   MEDLINE mouth rinse  15 mL Mouth Rinse BID Marylyn Ishihara, Tyrone A, DO   15 mL at 05/22/22 2128   metroNIDAZOLE (FLAGYL) IVPB 500 mg  500 mg Intravenous Q12H Kyle, Tyrone A, DO   Stopped at 05/22/22 2230   montelukast (SINGULAIR) tablet 10 mg  10 mg Oral QHS Kyle, Tyrone A, DO   10 mg at 05/22/22 2128   ondansetron (ZOFRAN) tablet 4 mg  4 mg Oral Q6H PRN Cherylann Ratel A, DO       Or   ondansetron (ZOFRAN) injection 4 mg  4 mg Intravenous Q6H PRN Marylyn Ishihara, Tyrone A, DO       pantoprazole (PROTONIX) EC tablet 40 mg  40 mg Oral Daily Audria Nine, DO   40 mg at 05/22/22 0912   potassium chloride (KLOR-CON M) CR tablet 30 mEq  30 mEq Oral Q4H Anders Simmonds, MD   30 mEq at 05/23/22 0524   predniSONE (DELTASONE) tablet 10 mg  10 mg Oral Q breakfast Gleason, Otilio Carpen, PA-C       traMADol Veatrice Bourbon) tablet 50 mg  50 mg Oral QID Marylyn Ishihara, Tyrone A, DO   50 mg at 05/22/22 2128     Discharge Medications: Please see discharge summary for a list of discharge medications.  Relevant Imaging Results:  Relevant Lab Results:   Additional Information [phone]  Leeroy Cha, RN

## 2022-05-23 NOTE — Progress Notes (Signed)
[facility] Progress Note Patient Name: Eileen Wilkerson DOB: [birth year] MRN: [id number]   Date of Service  05/23/2022  HPI/Events of Note  Hypokalemia - K+ = 2.9 and Creatinine = 1.05.  eICU Interventions  Will replace K+.     Intervention Category Major Interventions: Electrolyte abnormality - evaluation and management  Lysle Dingwall 05/23/2022, 4:37 AM

## 2022-05-23 NOTE — Evaluation (Signed)
Occupational Therapy Evaluation Patient Details Name: Eileen Wilkerson MRN: [id number] DOB: [birth year] Today's Date: 05/23/2022   History of Present Illness 88 yo female admitted with UTI, sepsis, diarrhea. Hx of RA, fibromyalgia, hypothyroidism, DDD, asthma,   Clinical Impression   Patient is a 88 year old female who was admitted for above. Patient was living in [place] at rollator level. Patient was noted to have decreased functional activity tolerance, decreased endurance, decreased standing balance, decreased safety awareness, and decreased knowledge of AD/AE impacting participation in ADLs. Patient would continue to benefit from skilled OT services at this time while admitted and after d/c to address noted deficits in order to improve overall safety and independence in ADLs.          Recommendations for follow up therapy are one component of a multi-disciplinary discharge planning process, led by the attending physician.  Recommendations may be updated based on patient status, additional functional criteria and insurance authorization.   Follow Up Recommendations  Skilled nursing-short term rehab (<3 hours/day)    Assistance Recommended at Discharge Frequent or constant Supervision/Assistance  Patient can return home with the following A lot of help with walking and/or transfers;A lot of help with bathing/dressing/bathroom;Assistance with cooking/housework;Direct supervision/assist for financial management;Assist for transportation;Help with stairs or ramp for entrance;Direct supervision/assist for medications management    Functional Status Assessment  Patient has had a recent decline in their functional status and demonstrates the ability to make significant improvements in function in a reasonable and predictable amount of time.  Equipment Recommendations  Other (comment) (defer to next venue)    Recommendations for Other Services       Precautions / Restrictions  Precautions Precautions: Fall Restrictions Weight Bearing Restrictions: No      Mobility Bed Mobility Overal bed mobility: Needs Assistance Bed Mobility: Supine to Sit     Supine to sit: Min assist     General bed mobility comments: with increased time    Transfers                          Balance Overall balance assessment: Mild deficits observed, not formally tested                                         ADL either performed or assessed with clinical judgement   ADL Overall ADL's : Needs assistance/impaired Eating/Feeding: Set up;Sitting   Grooming: Set up;Sitting   Upper Body Bathing: Minimal assistance;Sitting   Lower Body Bathing: Maximal assistance;Sit to/from stand;Sitting/lateral leans   Upper Body Dressing : Minimal assistance;Sitting   Lower Body Dressing: Maximal assistance;Sit to/from stand;Sitting/lateral leans   Toilet Transfer: Moderate assistance Toilet Transfer Details (indicate cue type and reason): with small steps with patient reported difficutly with shuffling feet with gripper socks in place. patient was educated that gripper socks were supposed to stop sliding. patient verbalized understanding. Toileting- Clothing Manipulation and Hygiene: Maximal assistance;Sit to/from stand       Functional mobility during ADLs: Moderate assistance       Vision Patient Visual Report: No change from baseline       Perception     Praxis      Pertinent Vitals/Pain Pain Assessment Pain Assessment: No/denies pain     Hand Dominance Right   Extremity/Trunk Assessment Upper Extremity Assessment Upper Extremity Assessment: Overall WFL for tasks assessed   Lower Extremity  Assessment Lower Extremity Assessment: Defer to PT evaluation   Cervical / Trunk Assessment Cervical / Trunk Assessment: Normal   Communication Communication Communication: No difficulties   Cognition Arousal/Alertness: Awake/alert Behavior  During Therapy: WFL for tasks assessed/performed Overall Cognitive Status: Within Functional Limits for tasks assessed                                 General Comments: plesantly able to particiapte with random vocalizations during session     General Comments       Exercises     Shoulder Instructions      Home Living Family/patient expects to be discharged to:: Private residence Living Arrangements: Alone Available Help at Discharge: Family;Personal care attendant;Available PRN/intermittently (aid x2 a week) Type of Home: Independent living facility Home Access: Level entry     Home Layout: One level               Home Equipment: Rollator (4 wheels)          Prior Functioning/Environment Prior Level of Function : Needs assist             Mobility Comments: uses rollator for household distances ADLs Comments: performs ADLs unassisted. help for homemaking, meals, transportation        OT Problem List: Decreased activity tolerance;Impaired balance (sitting and/or standing);Decreased safety awareness;Cardiopulmonary status limiting activity;Decreased knowledge of precautions;Decreased knowledge of use of DME or AE      OT Treatment/Interventions: Self-care/ADL training;Therapeutic exercise;Neuromuscular education;Energy conservation;DME and/or AE instruction;Therapeutic activities;Balance training;Patient/family education    OT Goals(Current goals can be found in the care plan section) Acute Rehab OT Goals Patient Stated Goal: to get back to ILF OT Goal Formulation: With patient Time For Goal Achievement: 06/06/22 Potential to Achieve Goals: Good  OT Frequency: Min 2X/week    Co-evaluation              AM-PAC OT "6 Clicks" Daily Activity     Outcome Measure Help from another person eating meals?: A Little Help from another person taking care of personal grooming?: A Little Help from another person toileting, which includes using  toliet, bedpan, or urinal?: A Lot Help from another person bathing (including washing, rinsing, drying)?: A Lot Help from another person to put on and taking off regular upper body clothing?: A Little Help from another person to put on and taking off regular lower body clothing?: A Lot 6 Click Score: 15   End of Session Nurse Communication: Mobility status  Activity Tolerance: Patient tolerated treatment well Patient left: in chair;with call bell/phone within reach  OT Visit Diagnosis: Unsteadiness on feet (R26.81);Other abnormalities of gait and mobility (R26.89);Muscle weakness (generalized) (M62.81)                Time: 6962-9528 OT Time Calculation (min): 20 min Charges:  OT General Charges $OT Visit: 1 Visit OT Evaluation $OT Eval Moderate Complexity: 1 Mod  Eileen Wilkerson OTR/L, MS Acute Rehabilitation Department Office# [phone] Pager# [phone]   Eileen Wilkerson 05/23/2022, 3:05 PM

## 2022-05-24 DIAGNOSIS — R7881 Bacteremia: Secondary | ICD-10-CM | POA: Diagnosis not present

## 2022-05-24 DIAGNOSIS — B9620 Unspecified Escherichia coli [E. coli] as the cause of diseases classified elsewhere: Secondary | ICD-10-CM | POA: Diagnosis not present

## 2022-05-24 LAB — GASTROINTESTINAL PANEL BY PCR, STOOL (REPLACES STOOL CULTURE)

## 2022-05-24 LAB — CULTURE, BLOOD (ROUTINE X 2): Special Requests: ADEQUATE

## 2022-05-24 LAB — URINE CULTURE: Culture: >=100000 — AB

## 2022-05-24 LAB — COMPREHENSIVE METABOLIC PANEL
ALT: 16 U/L (ref 0–44)
AST: 24 U/L (ref 15–41)
Albumin: 3.1 g/dL — ABNORMAL LOW (ref 3.5–5.0)
Alkaline Phosphatase: 60 U/L (ref 38–126)
Anion gap: 4 — ABNORMAL LOW (ref 5–15)
BUN: 15 mg/dL (ref 8–23)
CO2: 25 mmol/L (ref 22–32)
Calcium: 9.1 mg/dL (ref 8.9–10.3)
Chloride: 111 mmol/L (ref 98–111)
Creatinine, Ser: 0.9 mg/dL (ref 0.44–1.00)
GFR, Estimated: >60 mL/min (ref >60)
Glucose, Bld: 127 mg/dL — ABNORMAL HIGH (ref 70–99)
Potassium: 4.5 mmol/L (ref 3.5–5.1)
Sodium: 140 mmol/L (ref 135–145)
Total Bilirubin: 0.7 mg/dL (ref 0.3–1.2)
Total Protein: 6.4 g/dL — ABNORMAL LOW (ref 6.5–8.1)

## 2022-05-24 LAB — CBC
HCT: 36 % (ref 36.0–46.0)
Hemoglobin: 10.8 g/dL — ABNORMAL LOW (ref 12.0–15.0)
MCH: 31.5 pg (ref 26.0–34.0)
MCHC: 30 g/dL (ref 30.0–36.0)
MCV: 105 fL — ABNORMAL HIGH (ref 80.0–100.0)
Platelets: 143 10*3/uL — ABNORMAL LOW (ref 150–400)
RBC: 3.43 MIL/uL — ABNORMAL LOW (ref 3.87–5.11)
RDW: 14.6 % (ref 11.5–15.5)
WBC: 12.8 10*3/uL — ABNORMAL HIGH (ref 4.0–10.5)
nRBC: 0.2 % (ref 0.0–0.2)

## 2022-05-24 MED ORDER — AMOXICILLIN 500 MG PO CAPS
500.0000 mg | ORAL_CAPSULE | Freq: Once | ORAL | Status: AC
Start: 2022-05-24 — End: 2022-05-24
  Administered 2022-05-24: 500 mg via ORAL
  Filled 2022-05-24: qty 1

## 2022-05-24 MED ORDER — CEFAZOLIN SODIUM-DEXTROSE 2-4 GM/100ML-% IV SOLN
2.0000 g | Freq: Three times a day (TID) | INTRAVENOUS | Status: DC
Start: 2022-05-24 — End: 2022-05-26
  Administered 2022-05-24 – 2022-05-26 (×6): 2 g via INTRAVENOUS
  Filled 2022-05-24 (×8): qty 100

## 2022-05-24 MED ORDER — EPINEPHRINE 0.3 MG/0.3ML IJ SOAJ: 0.3000 mg | Freq: Once | INTRAMUSCULAR | Status: DC | PRN

## 2022-05-24 MED ORDER — CEFAZOLIN SODIUM-DEXTROSE 1-4 GM/50ML-% IV SOLN
1.0000 g | Freq: Three times a day (TID) | INTRAVENOUS | Status: DC
Start: 2022-05-24 — End: 2022-05-24
  Filled 2022-05-24: qty 50

## 2022-05-24 MED ORDER — DIPHENHYDRAMINE HCL 50 MG/ML IJ SOLN
25.0000 mg | Freq: Once | INTRAMUSCULAR | Status: DC | PRN
Start: 2022-05-24 — End: 2022-05-25

## 2022-05-24 NOTE — Progress Notes (Signed)
  Progress Note Patient: Eileen Wilkerson VIF:[id number] DOB: August 12, 1932 DOA: 05/21/2022  DOS: the patient was seen and examined on 05/24/2022  Brief hospital course: History of asthma, fibromyalgia, rheumatoid arthritis on chronic steroids presents to the hospital with complaints of diarrhea found to have E. coli UTI and bacteremia leading to septic shock and adrenal crisis. Assessment and Plan: Septic shock secondary to E. coli UTI and bacteremia. Severe diarrhea. Adrenal crisis. Presented with severe diarrhea.  Met SIRS criteria on admission with fever, tachycardia and tachypnea. Due to persistent hypotension on 6/12 was transferred to the ICU. Started on peripheral pressors, Along with stress dose steroids. Blood pressure improved significantly. Now off of the pressors. Blood cultures and urine culture growing E. coli.  Diarrhea work-up negative for any active infection. Was initially on IV cefepime and Flagyl, later on IV ceftriaxone and Flagyl. Now we will attempt IV Unasyn after amoxicillin challenge due to listed penicillin allergy.  Arthritis. Fibromyalgia. On chronic prednisone. Treated with stress dose steroids. Now back on home regimen.  Chronic diastolic CHF. Echocardiogram shows EF of 60 to 65%.  No wall motion normality. At home on Lasix currently on hold. Monitor.  Hypokalemia. Replaced. Monitor.  Hypokalemia. Acute kidney injury Treated with IV fluids. Monitor. Currently being replaced.  Macrocytic anemia. Mild thrombocytopenia. Likely dilutional component for drop in the hospital but now hemoglobin stable. We will check B12 level.  Subjective: No acute complaint.  No nausea no vomiting.  No diarrhea.  Passing gas.  Physical Exam: Vitals:   05/24/22 1500 05/24/22 1515 05/24/22 1527 05/24/22 1545  BP: (!) 156/83 126/85  108/88  Pulse: 72 66  68  Resp: _0 Temp:   97.8 F (36.6 C)   TempSrc:   Oral   SpO2: 98% 95%  97%  Weight:      Height:        General: Appear in mild distress; no visible Abnormal Neck Mass Or lumps, Conjunctiva normal Cardiovascular: S1 and S2 Present, no Murmur, Respiratory: good respiratory effort, Bilateral Air entry present and CTA, no Crackles, no wheezes Abdomen: Bowel Sound present, Non tender  Extremities: no Pedal edema Neurology: alert and oriented to time, place, and person  Gait not checked due to patient safety concerns   Data Reviewed: I have Reviewed nursing notes, Vitals, and Lab results since pt's last encounter. Pertinent lab results CBC and BMP I have ordered test including CBC and BMP    Family Communication: None at bedside  Disposition: Status is: Inpatient Remains inpatient appropriate because: Still needing IV antibiotics.  Still poor p.o. intake.  Author: Berle Mull, MD 05/24/2022 5:22 PM  Please look on [email or web address] to find out who is on call.

## 2022-05-24 NOTE — Hospital Course (Signed)
History of asthma, fibromyalgia, rheumatoid arthritis on chronic steroids presents to the hospital with complaints of diarrhea found to have E. coli UTI and bacteremia leading to septic shock and adrenal crisis.

## 2022-05-24 NOTE — Progress Notes (Signed)
Spoke with patient regarding her penicillin allergy. She reports a localized rash around an injection site many years ago that did not require any treatment. She also reports tolerating penicillin as an infusion 20-30 years ago. She is agreeable to trying a penicillin while inpatient. Will schedule amoxicillin oral challenge this afternoon and monitor. RN and MD aware.   Donald Pore, PharmD Pharmacy Resident 05/24/2022, 1:41 PM

## 2022-05-24 NOTE — Progress Notes (Signed)
Physical Therapy Treatment Patient Details Name: Eileen Wilkerson MRN: [id number] DOB: 1932-07-14 Today's Date: 05/24/2022   History of Present Illness 88 yo female admitted with UTI, sepsis, diarrhea. Hx of RA, fibromyalgia, hypothyroidism, DDD, asthma,    PT Comments    Progressing with mobility. Audible wheezing + dyspnea with activity. Fluctuating O2 sat reading during session (as low as mid 70s, as high as 94%)-will need to continue to assess. Once back in room and seated, O2>90% on RA. Will continue to progress activity as tolerated.     Recommendations for follow up therapy are one component of a multi-disciplinary discharge planning process, led by the attending physician.  Recommendations may be updated based on patient status, additional functional criteria and insurance authorization.  Follow Up Recommendations  Skilled nursing-short term rehab (<3 hours/day)     Assistance Recommended at Discharge PRN  Patient can return home with the following A little help with walking and/or transfers;A little help with bathing/dressing/bathroom;Assistance with cooking/housework;Assist for transportation;Help with stairs or ramp for entrance   Equipment Recommendations  None recommended by PT    Recommendations for Other Services       Precautions / Restrictions Precautions Precautions: Fall Precaution Comments: monitor O2 Restrictions Weight Bearing Restrictions: No     Mobility  Bed Mobility Overal bed mobility: Needs Assistance Bed Mobility: Supine to Sit     Supine to sit: Min guard, HOB elevated     General bed mobility comments: Increased time. Close guard for safety. Audible wheezing.    Transfers Overall transfer level: Needs assistance Equipment used: Rolling walker (2 wheels) Transfers: Sit to/from Stand Sit to Stand: Min assist           General transfer comment: Assist to rise, steady, control descent Cues for safety, hand placement. Increased  time.    Ambulation/Gait Ambulation/Gait assistance: Min assist Gait Distance (Feet): 50 Feet Assistive device: Rolling walker (2 wheels) Gait Pattern/deviations: Step-through pattern, Decreased stride length       General Gait Details: Fatigues fairly easily. Dyspnea 3/4 + wheezing. Fluctuating O2 sat readings (as low as mid 70s, as high as 94%)-will need to continue to assess.   Stairs             Wheelchair Mobility    Modified Rankin (Stroke Patients Only)       Balance Overall balance assessment: Needs assistance         Standing balance support: Bilateral upper extremity supported, During functional activity, Reliant on assistive device for balance Standing balance-Leahy Scale: Fair                              Cognition Arousal/Alertness: Awake/alert Behavior During Therapy: WFL for tasks assessed/performed Overall Cognitive Status: Within Functional Limits for tasks assessed                                          Exercises      General Comments        Pertinent Vitals/Pain Pain Assessment Pain Assessment: Faces Faces Pain Scale: Hurts little more Pain Location: plantar surface R foot Pain Descriptors / Indicators: Sore, Tender, Grimacing Pain Intervention(s): Limited activity within patient's tolerance, Monitored during session, Repositioned    Home Living  Prior Function            PT Goals (current goals can now be found in the care plan section) Progress towards PT goals: Progressing toward goals    Frequency    Min 3X/week      PT Plan Current plan remains appropriate    Co-evaluation              AM-PAC PT "6 Clicks" Mobility   Outcome Measure  Help needed turning from your back to your side while in a flat bed without using bedrails?: A Little Help needed moving from lying on your back to sitting on the side of a flat bed without using bedrails?:  A Little Help needed moving to and from a bed to a chair (including a wheelchair)?: A Little Help needed standing up from a chair using your arms (e.g., wheelchair or bedside chair)?: A Little Help needed to walk in hospital room?: A Lot Help needed climbing 3-5 steps with a railing? : A Lot 6 Click Score: 16    End of Session Equipment Utilized During Treatment: Gait belt Activity Tolerance: Patient limited by fatigue;Patient limited by pain Patient left: in chair;with call bell/phone within reach   PT Visit Diagnosis: Muscle weakness (generalized) (M62.81);Difficulty in walking, not elsewhere classified (R26.2)     Time: 1440-1455 PT Time Calculation (min) (ACUTE ONLY): 15 min  Charges:  $Gait Training: 8-22 mins                         Doreatha Massed, PT Acute Rehabilitation  Office: [phone] Pager: [phone]

## 2022-05-25 ENCOUNTER — Inpatient Hospital Stay (HOSPITAL_COMMUNITY): Payer: Medicare PPO

## 2022-05-25 DIAGNOSIS — B9620 Unspecified Escherichia coli [E. coli] as the cause of diseases classified elsewhere: Secondary | ICD-10-CM | POA: Diagnosis not present

## 2022-05-25 DIAGNOSIS — R7881 Bacteremia: Secondary | ICD-10-CM | POA: Diagnosis not present

## 2022-05-25 LAB — COMPREHENSIVE METABOLIC PANEL
ALT: 16 U/L (ref 0–44)
AST: 18 U/L (ref 15–41)
Albumin: 3.2 g/dL — ABNORMAL LOW (ref 3.5–5.0)
Alkaline Phosphatase: 62 U/L (ref 38–126)
Anion gap: 7 (ref 5–15)
BUN: 13 mg/dL (ref 8–23)
CO2: 25 mmol/L (ref 22–32)
Calcium: 9.2 mg/dL (ref 8.9–10.3)
Chloride: 107 mmol/L (ref 98–111)
Creatinine, Ser: 0.69 mg/dL (ref 0.44–1.00)
GFR, Estimated: >60 mL/min (ref >60)
Glucose, Bld: 97 mg/dL (ref 70–99)
Potassium: 3.9 mmol/L (ref 3.5–5.1)
Sodium: 139 mmol/L (ref 135–145)
Total Bilirubin: 0.6 mg/dL (ref 0.3–1.2)
Total Protein: 6.7 g/dL (ref 6.5–8.1)

## 2022-05-25 LAB — CBC WITH DIFFERENTIAL/PLATELET
Abs Immature Granulocytes: 0.06 10*3/uL (ref 0.00–0.07)
Basophils Absolute: 0 10*3/uL (ref 0.0–0.1)
Basophils Relative: 0 %
Eosinophils Absolute: 0.2 10*3/uL (ref 0.0–0.5)
Eosinophils Relative: 1 %
HCT: 39.4 % (ref 36.0–46.0)
Hemoglobin: 12.2 g/dL (ref 12.0–15.0)
Immature Granulocytes: 1 %
Lymphocytes Relative: 17 %
Lymphs Abs: 2 10*3/uL (ref 0.7–4.0)
MCH: 31.5 pg (ref 26.0–34.0)
MCHC: 31 g/dL (ref 30.0–36.0)
MCV: 101.8 fL — ABNORMAL HIGH (ref 80.0–100.0)
Monocytes Absolute: 1 10*3/uL (ref 0.1–1.0)
Monocytes Relative: 9 %
Neutro Abs: 8.4 10*3/uL — ABNORMAL HIGH (ref 1.7–7.7)
Neutrophils Relative %: 72 %
Platelets: 166 10*3/uL (ref 150–400)
RBC: 3.87 MIL/uL (ref 3.87–5.11)
RDW: 14.1 % (ref 11.5–15.5)
WBC: 11.7 10*3/uL — ABNORMAL HIGH (ref 4.0–10.5)
nRBC: 0 % (ref 0.0–0.2)

## 2022-05-25 LAB — MAGNESIUM: Magnesium: 2.1 mg/dL (ref 1.7–2.4)

## 2022-05-25 MED ORDER — NYSTATIN 100000 UNIT/ML MT SUSP
5.0000 mL | Freq: Four times a day (QID) | OROMUCOSAL | Status: DC
  Administered 2022-05-25 – 2022-05-27 (×8): 500000 [IU] via ORAL
  Filled 2022-05-25 (×9): qty 5

## 2022-05-25 MED ORDER — SIMETHICONE 80 MG PO CHEW
80.0000 mg | CHEWABLE_TABLET | Freq: Four times a day (QID) | ORAL | Status: DC
Start: 2022-05-25 — End: 2022-05-26
  Administered 2022-05-25 – 2022-05-26 (×4): 80 mg via ORAL
  Filled 2022-05-25 (×4): qty 1

## 2022-05-25 NOTE — Plan of Care (Signed)
  Problem: Education: Goal: Knowledge of General Education information will improve Description: Including pain rating scale, medication(s)/side effects and non-pharmacologic comfort measures Outcome: Progressing   Problem: Health Behavior/Discharge Planning: Goal: Ability to manage health-related needs will improve Outcome: Progressing   Problem: Clinical Measurements: Goal: Ability to maintain clinical measurements within normal limits will improve Outcome: Progressing Goal: Will remain free from infection Outcome: Progressing Goal: Diagnostic test results will improve Outcome: Progressing Goal: Respiratory complications will improve Outcome: Progressing Goal: Cardiovascular complication will be avoided Outcome: Progressing   Problem: Activity: Goal: Risk for activity intolerance will decrease Outcome: Progressing   Problem: Nutrition: Goal: Adequate nutrition will be maintained Outcome: Progressing   Problem: Coping: Goal: Level of anxiety will decrease Outcome: Progressing   Problem: Elimination: Goal: Will not experience complications related to bowel motility Outcome: Progressing Goal: Will not experience complications related to urinary retention Outcome: Progressing   Problem: Pain Managment: Goal: General experience of comfort will improve Outcome: Progressing   Problem: Safety: Goal: Ability to remain free from injury will improve Outcome: Progressing   Problem: Skin Integrity: Goal: Risk for impaired skin integrity will decrease Outcome: Progressing   Problem: Fluid Volume: Goal: Hemodynamic stability will improve Outcome: Progressing   Problem: Clinical Measurements: Goal: Diagnostic test results will improve Outcome: Progressing Goal: Signs and symptoms of infection will decrease Outcome: Progressing   Problem: Respiratory: Goal: Ability to maintain adequate ventilation will improve Outcome: Progressing   Problem: Education: Goal:  Knowledge of General Education information will improve Description: Including pain rating scale, medication(s)/side effects and non-pharmacologic comfort measures Outcome: Progressing   Problem: Health Behavior/Discharge Planning: Goal: Ability to manage health-related needs will improve Outcome: Progressing   Problem: Clinical Measurements: Goal: Ability to maintain clinical measurements within normal limits will improve Outcome: Progressing Goal: Will remain free from infection Outcome: Progressing Goal: Diagnostic test results will improve Outcome: Progressing Goal: Respiratory complications will improve Outcome: Progressing Goal: Cardiovascular complication will be avoided Outcome: Progressing   Problem: Activity: Goal: Risk for activity intolerance will decrease Outcome: Progressing   Problem: Nutrition: Goal: Adequate nutrition will be maintained Outcome: Progressing   Problem: Coping: Goal: Level of anxiety will decrease Outcome: Progressing   Problem: Elimination: Goal: Will not experience complications related to bowel motility Outcome: Progressing Goal: Will not experience complications related to urinary retention Outcome: Progressing   Problem: Pain Managment: Goal: General experience of comfort will improve Outcome: Progressing   Problem: Safety: Goal: Ability to remain free from injury will improve Outcome: Progressing   Problem: Skin Integrity: Goal: Risk for impaired skin integrity will decrease Outcome: Progressing

## 2022-05-25 NOTE — TOC Progression Note (Addendum)
Transition of Care [facility]) - Progression Note    Patient Details  Name: FEMALE IAFRATE MRN: [id number] Date of Birth: 1932/09/09  Transition of Care [facility]) CM/SW [place], [place] Phone Number: 05/25/2022, 11:04 AM  Clinical Narrative:    CSW spoke with Angela Nevin with [facility] who confirmed this pt is a resident at the [facility]. She states that they currently do not have any beds available at their SNF and typically pt's are required to private pay for this service. She states that she she has been in contact with pt's daughter and are concerned this pt will not be agreeable to placement at other facilities. She states if pt is not agreeable than ALF placement may be an option if the hospital team feels that this is also a safe discharge plan.   CSW met with patient and discussed recommendation for SNF placement and needing to seek placement outside of [facility]. Pt was discontent with not being able to receive SNF from [facility] however, was agreeable to look into SNF placement at other facilities before returning to ILF at [facility]. Pt provided verbal consent to speak with daughter regarding placement.  CSW left voicemail for pt's daughter, Lendon Ka ([phone]) to discuss placement for this pt.  CSW sent referrals out for SNF placement. Currently awaiting bed offers.    57: CSW spoke with pt's daughter who shared that her first choice for SNF placement is [facility] and states that she is on the board and has already been in contact with their facility to see about placement at their SNF. She states her second choice is [facility] for SNF placement. Referrals for both have been made.   Expected Discharge Plan: [place] Barriers to Discharge: Continued Medical Work up  Expected Discharge Plan and Services Expected Discharge Plan: [place]   Discharge Planning Services: CM Consult   Living arrangements for  the past 2 months: Apartment                                       Social Determinants of Health (SDOH) Interventions    Readmission Risk Interventions     No data to display

## 2022-05-25 NOTE — Progress Notes (Signed)
  Progress Note Patient: Eileen Wilkerson [id number]:[id number] DOB: Sep 24, 1932 DOA: 05/21/2022  DOS: the patient was seen and examined on 05/25/2022  Brief hospital course: History of asthma, fibromyalgia, rheumatoid arthritis on chronic steroids presents to the hospital with complaints of diarrhea found to have E. coli UTI and bacteremia leading to septic shock and adrenal crisis. Assessment and Plan: Septic shock secondary to E. coli UTI and bacteremia. Severe diarrhea. Adrenal crisis. Presented with severe diarrhea.  Met SIRS criteria on admission with fever, tachycardia and tachypnea. Due to persistent hypotension on 6/12 was transferred to the ICU. Started on peripheral pressors, Along with stress dose steroids. Blood pressure improved significantly. Now off of the pressors. Blood cultures and urine culture growing E. coli.  Diarrhea work-up negative for any active infection. Was initially on IV cefepime and Flagyl, later on IV ceftriaxone and Flagyl. Patient tolerated amoxicillin challenge without any issue. For now we will continue with cefepime and Flagyl combination as long as the patient has nausea, abdominal pain.   Rheumatoid arthritis. Fibromyalgia. On chronic prednisone. Treated with stress dose steroids. Now back on home regimen.   Chronic diastolic CHF. Echocardiogram shows EF of 60 to 65%.  No wall motion normality. At home on Lasix currently on hold. Monitor.   Hypokalemia. Replaced. Monitor.   Acute kidney injury Treated with IV fluids. Monitor. Currently being replaced.   Macrocytic anemia. Mild thrombocytopenia. Likely dilutional component for drop in the hospital but now hemoglobin stable. We will check B12 level.  Obesity Body mass index is 31.28 kg/m.  Placing the pt at higher risk of poor outcomes.  Subjective: Reports abdominal distention as well as some discomfort.  Also reports nausea.  No fever and chills.  No abdominal pain.  Passing gas.  No  BM.  Physical Exam: Vitals:   05/24/22 1808 05/24/22 1931 05/25/22 0259 05/25/22 1407  BP: (!) 150/106 (!) 152/88 (!) 158/80 (!) 155/84  Pulse: 83 65 70 79  Resp: $Remo'18 16 16 16  'VzZCY$ Temp: 98.2 F (36.8 C) 98.1 F (36.7 C) 97.9 F (36.6 C) 98.1 F (36.7 C)  TempSrc: Oral Oral Oral Oral  SpO2: 96% 96% 95% 96%  Weight:      Height:       General: Appear in moderate distress; no visible Abnormal Neck Mass Or lumps, Conjunctiva normal Cardiovascular: S1 and S2 Present, no Murmur, Respiratory: good respiratory effort, Bilateral Air entry present and faint Crackles, no wheezes Abdomen: Bowel Sound present, Non tender, distended Extremities: no Pedal edema Neurology: alert and oriented to time, place, and person  Gait not checked due to patient safety concerns   Data Reviewed: I have Reviewed nursing notes, Vitals, and Lab results since pt's last encounter. Pertinent lab results CBC and BMP I have independently visualized and interpreted imaging and x-ray abdomen which showed no evidence of severe constipation or distention of bowels. I have ordered imaging studies x-ray abdomen.   Family Communication: None at bedside  Disposition: Status is: Inpatient Remains inpatient appropriate because: Still with poor p.o. intake.  Due to nausea we will continue with IV antibiotics. Author: Berle Mull, MD 05/25/2022 6:28 PM  Please look on [email or web address] to find out who is on call.

## 2022-05-25 NOTE — Plan of Care (Signed)
  Problem: Education: Goal: Knowledge of General Education information will improve Description: Including pain rating scale, medication(s)/side effects and non-pharmacologic comfort measures Outcome: Progressing   Problem: Clinical Measurements: Goal: Ability to maintain clinical measurements within normal limits will improve Outcome: Progressing Goal: Will remain free from infection Outcome: Progressing Goal: Diagnostic test results will improve Outcome: Progressing Goal: Respiratory complications will improve Outcome: Progressing Goal: Cardiovascular complication will be avoided Outcome: Progressing   Problem: Coping: Goal: Level of anxiety will decrease Outcome: Progressing   Problem: Elimination: Goal: Will not experience complications related to bowel motility Outcome: Progressing Goal: Will not experience complications related to urinary retention Outcome: Progressing   Problem: Pain Managment: Goal: General experience of comfort will improve Outcome: Progressing   Problem: Safety: Goal: Ability to remain free from injury will improve Outcome: Progressing   Problem: Skin Integrity: Goal: Risk for impaired skin integrity will decrease Outcome: Progressing   Problem: Fluid Volume: Goal: Hemodynamic stability will improve Outcome: Progressing   Problem: Clinical Measurements: Goal: Diagnostic test results will improve Outcome: Progressing Goal: Signs and symptoms of infection will decrease Outcome: Progressing   Problem: Respiratory: Goal: Ability to maintain adequate ventilation will improve Outcome: Progressing   Problem: Education: Goal: Knowledge of General Education information will improve Description: Including pain rating scale, medication(s)/side effects and non-pharmacologic comfort measures Outcome: Progressing   Problem: Health Behavior/Discharge Planning: Goal: Ability to manage health-related needs will improve Outcome: Progressing    Problem: Clinical Measurements: Goal: Ability to maintain clinical measurements within normal limits will improve Outcome: Progressing Goal: Will remain free from infection Outcome: Progressing Goal: Diagnostic test results will improve Outcome: Progressing Goal: Respiratory complications will improve Outcome: Progressing Goal: Cardiovascular complication will be avoided Outcome: Progressing   Problem: Coping: Goal: Level of anxiety will decrease Outcome: Progressing   Problem: Elimination: Goal: Will not experience complications related to bowel motility Outcome: Progressing Goal: Will not experience complications related to urinary retention Outcome: Progressing   Problem: Pain Managment: Goal: General experience of comfort will improve Outcome: Progressing   Problem: Safety: Goal: Ability to remain free from injury will improve Outcome: Progressing   Problem: Skin Integrity: Goal: Risk for impaired skin integrity will decrease Outcome: Progressing

## 2022-05-25 NOTE — Care Management Important Message (Signed)
Important Message  Patient Details IM Letter placed in Patients room. Name: Eileen Wilkerson MRN: [id number] Date of Birth: 1932-03-01   Medicare Important Message Given:  Yes     Kerin Salen 05/25/2022, 10:31 AM

## 2022-05-26 ENCOUNTER — Inpatient Hospital Stay (HOSPITAL_COMMUNITY): Payer: Medicare PPO

## 2022-05-26 DIAGNOSIS — Z86718 Personal history of other venous thrombosis and embolism: Secondary | ICD-10-CM

## 2022-05-26 DIAGNOSIS — I82409 Acute embolism and thrombosis of unspecified deep veins of unspecified lower extremity: Secondary | ICD-10-CM

## 2022-05-26 DIAGNOSIS — R7881 Bacteremia: Secondary | ICD-10-CM | POA: Diagnosis not present

## 2022-05-26 DIAGNOSIS — R609 Edema, unspecified: Secondary | ICD-10-CM

## 2022-05-26 DIAGNOSIS — B9620 Unspecified Escherichia coli [E. coli] as the cause of diseases classified elsewhere: Secondary | ICD-10-CM | POA: Diagnosis not present

## 2022-05-26 LAB — COMPREHENSIVE METABOLIC PANEL
ALT: 11 U/L (ref 0–44)
AST: 13 U/L — ABNORMAL LOW (ref 15–41)
Albumin: 2.7 g/dL — ABNORMAL LOW (ref 3.5–5.0)
Alkaline Phosphatase: 49 U/L (ref 38–126)
Anion gap: 6 (ref 5–15)
BUN: 11 mg/dL (ref 8–23)
CO2: 27 mmol/L (ref 22–32)
Calcium: 9.1 mg/dL (ref 8.9–10.3)
Chloride: 109 mmol/L (ref 98–111)
Creatinine, Ser: 0.71 mg/dL (ref 0.44–1.00)
GFR, Estimated: >60 mL/min (ref >60)
Glucose, Bld: 87 mg/dL (ref 70–99)
Potassium: 4.2 mmol/L (ref 3.5–5.1)
Sodium: 142 mmol/L (ref 135–145)
Total Bilirubin: 0.6 mg/dL (ref 0.3–1.2)
Total Protein: 5.7 g/dL — ABNORMAL LOW (ref 6.5–8.1)

## 2022-05-26 LAB — CBC WITH DIFFERENTIAL/PLATELET
Abs Immature Granulocytes: 0.07 10*3/uL (ref 0.00–0.07)
Basophils Absolute: 0.1 10*3/uL (ref 0.0–0.1)
Basophils Relative: 1 %
Eosinophils Absolute: 0.1 10*3/uL (ref 0.0–0.5)
Eosinophils Relative: 1 %
HCT: 36.7 % (ref 36.0–46.0)
Hemoglobin: 11.4 g/dL — ABNORMAL LOW (ref 12.0–15.0)
Immature Granulocytes: 1 %
Lymphocytes Relative: 23 %
Lymphs Abs: 2.4 10*3/uL (ref 0.7–4.0)
MCH: 31.3 pg (ref 26.0–34.0)
MCHC: 31.1 g/dL (ref 30.0–36.0)
MCV: 100.8 fL — ABNORMAL HIGH (ref 80.0–100.0)
Monocytes Absolute: 0.8 10*3/uL (ref 0.1–1.0)
Monocytes Relative: 8 %
Neutro Abs: 6.9 10*3/uL (ref 1.7–7.7)
Neutrophils Relative %: 66 %
Platelets: 144 10*3/uL — ABNORMAL LOW (ref 150–400)
RBC: 3.64 MIL/uL — ABNORMAL LOW (ref 3.87–5.11)
RDW: 13.9 % (ref 11.5–15.5)
WBC: 10.4 10*3/uL (ref 4.0–10.5)
nRBC: 0 % (ref 0.0–0.2)

## 2022-05-26 LAB — VITAMIN B12: Vitamin B-12: 1377 pg/mL — ABNORMAL HIGH (ref 180–914)

## 2022-05-26 LAB — MAGNESIUM: Magnesium: 2.1 mg/dL (ref 1.7–2.4)

## 2022-05-26 MED ORDER — CEFDINIR 300 MG PO CAPS
300.0000 mg | ORAL_CAPSULE | Freq: Two times a day (BID) | ORAL | Status: DC
  Filled 2022-05-26: qty 1

## 2022-05-26 MED ORDER — APIXABAN 5 MG PO TABS: 5.0000 mg | ORAL_TABLET | Freq: Two times a day (BID) | ORAL | Status: DC

## 2022-05-26 MED ORDER — APIXABAN 5 MG PO TABS
10.0000 mg | ORAL_TABLET | Freq: Two times a day (BID) | ORAL | Status: DC
  Administered 2022-05-26 – 2022-05-30 (×9): 10 mg via ORAL
  Filled 2022-05-26 (×9): qty 2

## 2022-05-26 MED ORDER — SIMETHICONE 80 MG PO CHEW
80.0000 mg | CHEWABLE_TABLET | Freq: Four times a day (QID) | ORAL | Status: DC | PRN
Start: 2022-05-26 — End: 2022-05-30

## 2022-05-26 MED ORDER — ENOXAPARIN SODIUM 40 MG/0.4ML IJ SOSY
40.0000 mg | PREFILLED_SYRINGE | Freq: Every day | INTRAMUSCULAR | Status: DC
  Filled 2022-05-26: qty 0.4

## 2022-05-26 MED ORDER — CEFADROXIL 500 MG PO CAPS
500.0000 mg | ORAL_CAPSULE | Freq: Two times a day (BID) | ORAL | Status: DC
Start: 2022-05-26 — End: 2022-05-30
  Administered 2022-05-26 – 2022-05-30 (×9): 500 mg via ORAL
  Filled 2022-05-26 (×9): qty 1

## 2022-05-26 NOTE — Progress Notes (Signed)
Bilateral lower extremity venous duplex has been completed. Preliminary results can be found in CV Proc through chart review.  Results were given to the patient's nurse, Raquel Sarna.  05/26/22 1:55 PM Carlos Levering RVT

## 2022-05-26 NOTE — TOC Progression Note (Addendum)
Transition of Care [facility]) - Progression Note    Patient Details  Name: Eileen Wilkerson MRN: [id number] Date of Birth: 1931/12/19  Transition of Care [facility]) CM/SW [place], LCSW Phone Number: 05/26/2022, 9:10 AM  Clinical Narrative:    Met with pt and daughter at bedside to review bed offers for SNF placement. Pt's daughter states that [facility] declined her due to not having bed availability. Pt was also declined at [facility] due to lack of bed availability. CSW reviewed other bed offers however, pt/daughter were not interested in the accepting facilities.   Pt/daughter are now wanting to explore pt remaining at [facility] and being placed in their ALF with additional supports from their PT/OT.   1040: CSW left a voicemail w/ Angela Nevin at [facility] to discuss pt/daughter preference and level of care they are able to provide to this pt.   1240: CSW attempted to reach Ms. Alene Mires again regarding placement for this pt at their facility. CSW will continue to await for return call.   1340: CSW received call from Standard, SW at [facility] to discuss discharge plans for this pt. She is to have their RN review this pt's records to determine if they are able to provide the appropriate level of care for her.   49: Spoke with Ivy at [facility] who shared that they are unable to accept this pt to their ALF due to level of care currently required. CSW spoke with pt's daughter, Ms. Debrew who shared that this pt is currently wanting to return home with 24/7 Home Health services. CSW is able to refer pt for HHPT/OT however, for 24/7 care the family will need to explore and set this up for pt as the hospital is unable to do this for pt. CSW spoke with pt and shared the same information. CSW also referred pt to additional SNF's however, when discussed with pt she was not interested in looking for further SNF placement.   Expected Discharge Plan: [place] Barriers to  Discharge: Continued Medical Work up  Expected Discharge Plan and Services Expected Discharge Plan: [place]   Discharge Planning Services: CM Consult   Living arrangements for the past 2 months: Apartment                                       Social Determinants of Health (SDOH) Interventions    Readmission Risk Interventions     No data to display

## 2022-05-26 NOTE — Progress Notes (Signed)
PT Cancellation Note  Patient Details Name: Eileen Wilkerson MRN: [id number] DOB: 12-20-1931   Cancelled Treatment:    Reason Eval/Treat Not Completed: Pain limiting ability to participate (pt reports R foot is very painful, she's not able to tolerate mobility at present. Pt had pain with minimal movement of R ankle.  RN aware. Will follow.)  [name] PT 05/26/2022  Acute Rehabilitation Services Pager [phone] Office [phone]

## 2022-05-26 NOTE — Progress Notes (Signed)
ANTICOAGULATION CONSULT NOTE - Initial Consult  Pharmacy Consult for Apixaban Indication: DVT  Allergies  Allergen Reactions   Adhesive [Tape] Rash   Celebrex [Celecoxib] Hives   Ciprofibrate Nausea Only   Cymbalta [Duloxetine Hcl] Swelling   Gabitril [Tiagabine] Swelling   Lyrica [Pregabalin] Swelling   Neurontin [Gabapentin] Swelling   Nexium [Esomeprazole] Rash   Nsaids Rash   Shrimp [Shellfish Allergy] Anaphylaxis    Per patient "shrimp only"   Azactam [Aztreonam]     Hand swelling    Azelastine Hcl     Rash    Ciprofloxacin Other (See Comments)    dizziness   Claritin [Loratadine]     Irritability Nervousness    Methotrexate Derivatives    Nasacort [Triamcinolone]     Dizzy    Olopatadine Other (See Comments)    Pain and lethargy    Other    Sulfamethizole Other (See Comments)    unknown   Zantac [Ranitidine Hcl] Other (See Comments)    unknown   Claritin-D 12 Hour [Loratadine-Pseudoephedrine Er] Anxiety   Keflex [Cephalexin] Nausea And Vomiting    Tolerated Ancef   Sulfa Antibiotics Nausea And Vomiting    Patient Measurements: Height: '5\' 3"'$  (160 cm) Weight: 80.1 kg (176 lb 9.4 oz) IBW/kg (Calculated) : 52.4 Heparin Dosing Weight:   Vital Signs: Temp: 98.4 F (36.9 C) (06/16 1310) Temp Source: Oral (06/16 1310) BP: 132/69 (06/16 1310) Pulse Rate: 97 (06/16 1310)  Labs: Recent Labs    05/24/22 0314 05/25/22 0554 05/26/22 0535  HGB 10.8* 12.2 11.4*  HCT 36.0 39.4 36.7  PLT 143* 166 144*  CREATININE 0.90 0.69 0.71    Estimated Creatinine Clearance: 49.7 mL/min (by C-G formula based on SCr of 0.71 mg/dL).   Medical History: Past Medical History:  Diagnosis Date   Adrenal failure ([place])    Arthritis    Asthma    Cancer ([place])    Cataract    Closed nondisplaced fracture of fifth right metatarsal bone 09/18/2017   Diverticulitis    Per patient   Fibromyalgia 2008   HCAP (healthcare-associated pneumonia) 02/03/2018   Osteoporosis    RA  (rheumatoid arthritis) (H[facility])    Recurrent upper respiratory infection (URI)    Sepsis due to urinary tract infection ([place]) 01/18/2018   Urticaria     Medications:  Scheduled:   atorvastatin  20 mg Oral Daily   cefadroxil  500 mg Oral BID   cycloSPORINE  1 drop Both Eyes BID   feeding supplement  237 mL Oral BID BM   fluticasone  2 spray Each Nare Daily   mouth rinse  15 mL Mouth Rinse BID   montelukast  10 mg Oral QHS   multivitamin with minerals  1 tablet Oral Daily   nystatin  5 mL Oral QID   pantoprazole  40 mg Oral Daily   predniSONE  10 mg Oral Q breakfast   traMADol  50 mg Oral QID   Infusions:   Assessment: 76 yoF admitted on 6/11 with Ecoli UTI and bacteremia.  Pharmacy is consulted on 6/16 to dose apixaban for acute DVT. No prior to admission anticoagulation.  Baseline INR 1.2 (6/12).  Lovenox for VTE prophylaxis scheduled to start today, but no doses given.   CBC: Hgb 11.4, Plt 144  SCr < 1   Goal of Therapy:  Monitor platelets by anticoagulation protocol: Yes   Plan:  Apixaban '10mg'$  PO BID x7 days, then '5mg'$  PO BID Pharmacy to provide education and coupon prior  to discharge Pharmacy will f/u peripherally, but sign off consult notes.     Gretta Arab PharmD, BCPS Clinical Pharmacist [facility] main pharmacy [phone] 05/26/2022 2:32 PM

## 2022-05-26 NOTE — Plan of Care (Signed)
  Problem: Education: Goal: Knowledge of General Education information will improve Description: Including pain rating scale, medication(s)/side effects and non-pharmacologic comfort measures Outcome: Progressing   Problem: Health Behavior/Discharge Planning: Goal: Ability to manage health-related needs will improve Outcome: Progressing   Problem: Clinical Measurements: Goal: Ability to maintain clinical measurements within normal limits will improve Outcome: Progressing Goal: Will remain free from infection Outcome: Progressing Goal: Diagnostic test results will improve Outcome: Progressing Goal: Respiratory complications will improve Outcome: Progressing Goal: Cardiovascular complication will be avoided Outcome: Progressing   Problem: Activity: Goal: Risk for activity intolerance will decrease Outcome: Progressing   Problem: Nutrition: Goal: Adequate nutrition will be maintained Outcome: Progressing   Problem: Coping: Goal: Level of anxiety will decrease Outcome: Progressing   Problem: Elimination: Goal: Will not experience complications related to bowel motility Outcome: Progressing Goal: Will not experience complications related to urinary retention Outcome: Progressing   Problem: Pain Managment: Goal: General experience of comfort will improve Outcome: Progressing   Problem: Safety: Goal: Ability to remain free from injury will improve Outcome: Progressing   Problem: Skin Integrity: Goal: Risk for impaired skin integrity will decrease Outcome: Progressing   Problem: Fluid Volume: Goal: Hemodynamic stability will improve Outcome: Progressing   Problem: Clinical Measurements: Goal: Diagnostic test results will improve Outcome: Progressing Goal: Signs and symptoms of infection will decrease Outcome: Progressing   Problem: Respiratory: Goal: Ability to maintain adequate ventilation will improve Outcome: Progressing   Problem: Education: Goal:  Knowledge of General Education information will improve Description: Including pain rating scale, medication(s)/side effects and non-pharmacologic comfort measures Outcome: Progressing   Problem: Health Behavior/Discharge Planning: Goal: Ability to manage health-related needs will improve Outcome: Progressing   Problem: Clinical Measurements: Goal: Ability to maintain clinical measurements within normal limits will improve Outcome: Progressing Goal: Will remain free from infection Outcome: Progressing Goal: Diagnostic test results will improve Outcome: Progressing Goal: Respiratory complications will improve Outcome: Progressing Goal: Cardiovascular complication will be avoided Outcome: Progressing   Problem: Activity: Goal: Risk for activity intolerance will decrease Outcome: Progressing   Problem: Nutrition: Goal: Adequate nutrition will be maintained Outcome: Progressing   Problem: Coping: Goal: Level of anxiety will decrease Outcome: Progressing   Problem: Elimination: Goal: Will not experience complications related to bowel motility Outcome: Progressing Goal: Will not experience complications related to urinary retention Outcome: Progressing   Problem: Pain Managment: Goal: General experience of comfort will improve Outcome: Progressing   Problem: Safety: Goal: Ability to remain free from injury will improve Outcome: Progressing   Problem: Skin Integrity: Goal: Risk for impaired skin integrity will decrease Outcome: Progressing

## 2022-05-26 NOTE — Progress Notes (Addendum)
Progress Note Patient: Eileen Wilkerson QAE:[id number] DOB: 27-Mar-1932 DOA: 05/21/2022  DOS: the patient was seen and examined on 05/26/2022  Brief hospital course: History of asthma, fibromyalgia, rheumatoid arthritis on chronic steroids presents to the hospital with complaints of diarrhea found to have E. coli UTI and bacteremia leading to septic shock and adrenal crisis. Assessment and Plan: Septic shock secondary to E. coli UTI and bacteremia. Severe diarrhea. Adrenal crisis. Presented with severe diarrhea.  Met SIRS criteria on admission with fever, tachycardia and tachypnea. Due to persistent hypotension on 6/12 was transferred to the ICU. Started on peripheral pressors, Along with stress dose steroids. Blood pressure improved significantly. Now off of the pressors. Blood cultures and urine culture growing E. coli.  Diarrhea work-up negative for any active infection. Was initially on IV cefepime and Flagyl, later on IV ceftriaxone and Flagyl. Patient tolerated amoxicillin challenge without any issue. Given that the patient is able to tolerate p.o. diet I will transition to oral cefadroxil for a total of 5 more days.   Rheumatoid arthritis. Fibromyalgia. On chronic prednisone. Treated with stress dose steroids. Now back on home regimen.  Acute right distal posterior tibial DVT. Patient reported some ankle pain and her Doppler is positive for DVT. We will treat with oral anticoagulation as patient remains at high risk for clot progression due to her weakness. Discussed anticoagulation options with patient as well as daughter and they decided to initiate with Eliquis.  Chronic diastolic CHF. Echocardiogram shows EF of 60 to 65%.  No wall motion normality. At home on Lasix currently on hold.  Will likely resume tomorrow. Monitor.   Hypokalemia. Replaced. Monitor.   Acute kidney injury Treated with IV fluids. Monitor.   Macrocytic anemia. Mild thrombocytopenia. Likely  dilutional component for drop in the hospital but now hemoglobin stable. B12 level normal.   Obesity Body mass index is 31.28 kg/m.  Placing the pt at higher risk of poor outcomes.  Deconditioning. Physical therapy worked with the patient, patient was able to will ambulate for 50 feet. Recommended SNF. Patient wants to go home back to her ALF with home health.  Patient will be arranging 24-hour assistance.  Subjective: Reports pain on her both legs right more than left.  Specifically around her ankle.  Feels as if her legs are going to burst.  No nausea no vomiting.  No fever no chills.  No chest pain abdominal pain.  Physical Exam: Vitals:   05/25/22 1407 05/25/22 2028 05/26/22 0306 05/26/22 1310  BP: (!) 155/84 129/68 129/70 132/69  Pulse: 79 73 74 97  Resp: '16 18 16 18  ' Temp: 98.1 F (36.7 C) 98.8 F (37.1 C) 97.9 F (36.6 C) 98.4 F (36.9 C)  TempSrc: Oral Oral Oral Oral  SpO2: 96% 93% 91% 92%  Weight:      Height:       General: Appear in mild distress; no visible Abnormal Neck Mass Or lumps, Conjunctiva normal Cardiovascular: S1 and S2 Present, no Murmur, Respiratory: good respiratory effort, Bilateral Air entry present and CTA, no Crackles, no wheezes Abdomen: Bowel Sound present, Non tender  Extremities: bilateral  Pedal edema Neurology: alert and oriented to time, place, and person  Gait not checked due to patient safety concerns   Data Reviewed: I have Reviewed nursing notes, Vitals, and Lab results since pt's last encounter. Pertinent lab results CBC and BMP I have ordered test including CBC and BMP I have ordered imaging studies lower extremity Doppler.   Family Communication: Discussed  with daughter on the phone  Disposition: Status is: Inpatient Remains inpatient appropriate because: Close observation for improvement in the pain as well as treatment for DVT. Author: Berle Mull, MD 05/26/2022 6:17 PM  Please look on [email or web address] to find out who is  on call.

## 2022-05-26 NOTE — Discharge Instructions (Signed)
Information on my medicine - ELIQUIS (apixaban) Why was Eliquis prescribed for you? Eliquis was prescribed to treat blood clots that may have been found in the veins of your legs (deep vein thrombosis) or in your lungs (pulmonary embolism) and to reduce the risk of them occurring again.  What do You need to know about Eliquis ? The starting dose is 10 mg (two 5 mg tablets) taken TWICE daily for the FIRST SEVEN (7) DAYS, then on (enter date)  06/02/22  the dose is reduced to ONE 5 mg tablet taken TWICE daily.  Eliquis may be taken with or without food.   Try to take the dose about the same time in the morning and in the evening. If you have difficulty swallowing the tablet whole please discuss with your pharmacist how to take the medication safely.  Take Eliquis exactly as prescribed and DO NOT stop taking Eliquis without talking to the doctor who prescribed the medication.  Stopping may increase your risk of developing a new blood clot.  Refill your prescription before you run out.  After discharge, you should have regular check-up appointments with your healthcare provider that is prescribing your Eliquis.    What do you do if you miss a dose? If a dose of ELIQUIS is not taken at the scheduled time, take it as soon as possible on the same day and twice-daily administration should be resumed. The dose should not be doubled to make up for a missed dose.  Important Safety Information A possible side effect of Eliquis is bleeding. You should call your healthcare provider right away if you experience any of the following: Bleeding from an injury or your nose that does not stop. Unusual colored urine (red or dark brown) or unusual colored stools (red or black). Unusual bruising for unknown reasons. A serious fall or if you hit your head (even if there is no bleeding).  Some medicines may interact with Eliquis and might increase your risk of bleeding or clotting while on Eliquis. To help  avoid this, consult your healthcare provider or pharmacist prior to using any new prescription or non-prescription medications, including herbals, vitamins, non-steroidal anti-inflammatory drugs (NSAIDs) and supplements.  This website has more information on Eliquis (apixaban): http://www.eliquis.com/eliquis/home

## 2022-05-27 DIAGNOSIS — B9620 Unspecified Escherichia coli [E. coli] as the cause of diseases classified elsewhere: Secondary | ICD-10-CM | POA: Diagnosis not present

## 2022-05-27 DIAGNOSIS — R7881 Bacteremia: Secondary | ICD-10-CM | POA: Diagnosis not present

## 2022-05-27 LAB — COMPREHENSIVE METABOLIC PANEL
ALT: 10 U/L (ref 0–44)
AST: 14 U/L — ABNORMAL LOW (ref 15–41)
Albumin: 2.9 g/dL — ABNORMAL LOW (ref 3.5–5.0)
Alkaline Phosphatase: 49 U/L (ref 38–126)
Anion gap: 7 (ref 5–15)
BUN: 12 mg/dL (ref 8–23)
CO2: 27 mmol/L (ref 22–32)
Calcium: 9.1 mg/dL (ref 8.9–10.3)
Chloride: 108 mmol/L (ref 98–111)
Creatinine, Ser: 0.74 mg/dL (ref 0.44–1.00)
GFR, Estimated: >60 mL/min (ref >60)
Glucose, Bld: 94 mg/dL (ref 70–99)
Potassium: 3.8 mmol/L (ref 3.5–5.1)
Sodium: 142 mmol/L (ref 135–145)
Total Bilirubin: 0.8 mg/dL (ref 0.3–1.2)
Total Protein: 6.1 g/dL — ABNORMAL LOW (ref 6.5–8.1)

## 2022-05-27 LAB — CBC WITH DIFFERENTIAL/PLATELET
Abs Immature Granulocytes: 0.13 10*3/uL — ABNORMAL HIGH (ref 0.00–0.07)
Basophils Absolute: 0.1 10*3/uL (ref 0.0–0.1)
Basophils Relative: 1 %
Eosinophils Absolute: 0.2 10*3/uL (ref 0.0–0.5)
Eosinophils Relative: 2 %
HCT: 38 % (ref 36.0–46.0)
Hemoglobin: 11.6 g/dL — ABNORMAL LOW (ref 12.0–15.0)
Immature Granulocytes: 1 %
Lymphocytes Relative: 25 %
Lymphs Abs: 3.1 10*3/uL (ref 0.7–4.0)
MCH: 31.2 pg (ref 26.0–34.0)
MCHC: 30.5 g/dL (ref 30.0–36.0)
MCV: 102.2 fL — ABNORMAL HIGH (ref 80.0–100.0)
Monocytes Absolute: 1.2 10*3/uL — ABNORMAL HIGH (ref 0.1–1.0)
Monocytes Relative: 10 %
Neutro Abs: 8 10*3/uL — ABNORMAL HIGH (ref 1.7–7.7)
Neutrophils Relative %: 61 %
Platelets: 236 10*3/uL (ref 150–400)
RBC: 3.72 MIL/uL — ABNORMAL LOW (ref 3.87–5.11)
RDW: 14.4 % (ref 11.5–15.5)
WBC: 12.8 10*3/uL — ABNORMAL HIGH (ref 4.0–10.5)
nRBC: 0 % (ref 0.0–0.2)

## 2022-05-27 LAB — MAGNESIUM: Magnesium: 2.1 mg/dL (ref 1.7–2.4)

## 2022-05-27 MED ORDER — FUROSEMIDE 40 MG PO TABS
40.0000 mg | ORAL_TABLET | Freq: Two times a day (BID) | ORAL | Status: DC
  Administered 2022-05-27: 40 mg via ORAL
  Filled 2022-05-27 (×2): qty 1

## 2022-05-27 MED ORDER — OXYCODONE HCL 5 MG PO TABS
5.0000 mg | ORAL_TABLET | ORAL | Status: DC | PRN
  Administered 2022-05-27 – 2022-05-28 (×3): 5 mg via ORAL
  Filled 2022-05-27 (×3): qty 1

## 2022-05-27 MED ORDER — COLCHICINE 0.6 MG PO TABS
0.6000 mg | ORAL_TABLET | Freq: Every day | ORAL | Status: DC
  Administered 2022-05-27 – 2022-05-30 (×4): 0.6 mg via ORAL
  Filled 2022-05-27 (×4): qty 1

## 2022-05-27 NOTE — Plan of Care (Signed)
  Problem: Education: Goal: Knowledge of General Education information will improve Description: Including pain rating scale, medication(s)/side effects and non-pharmacologic comfort measures Outcome: Progressing   Problem: Health Behavior/Discharge Planning: Goal: Ability to manage health-related needs will improve Outcome: Progressing   Problem: Clinical Measurements: Goal: Ability to maintain clinical measurements within normal limits will improve Outcome: Progressing Goal: Will remain free from infection Outcome: Progressing Goal: Diagnostic test results will improve Outcome: Progressing Goal: Respiratory complications will improve Outcome: Progressing Goal: Cardiovascular complication will be avoided Outcome: Progressing   Problem: Activity: Goal: Risk for activity intolerance will decrease Outcome: Progressing   Problem: Nutrition: Goal: Adequate nutrition will be maintained Outcome: Progressing   Problem: Coping: Goal: Level of anxiety will decrease Outcome: Progressing   Problem: Elimination: Goal: Will not experience complications related to bowel motility Outcome: Progressing Goal: Will not experience complications related to urinary retention Outcome: Progressing   Problem: Pain Managment: Goal: General experience of comfort will improve Outcome: Progressing   Problem: Safety: Goal: Ability to remain free from injury will improve Outcome: Progressing   Problem: Skin Integrity: Goal: Risk for impaired skin integrity will decrease Outcome: Progressing   Problem: Fluid Volume: Goal: Hemodynamic stability will improve Outcome: Progressing   Problem: Clinical Measurements: Goal: Diagnostic test results will improve Outcome: Progressing Goal: Signs and symptoms of infection will decrease Outcome: Progressing   Problem: Respiratory: Goal: Ability to maintain adequate ventilation will improve Outcome: Progressing   Problem: Education: Goal:  Knowledge of General Education information will improve Description: Including pain rating scale, medication(s)/side effects and non-pharmacologic comfort measures Outcome: Progressing   Problem: Health Behavior/Discharge Planning: Goal: Ability to manage health-related needs will improve Outcome: Progressing   Problem: Clinical Measurements: Goal: Ability to maintain clinical measurements within normal limits will improve Outcome: Progressing Goal: Will remain free from infection Outcome: Progressing Goal: Diagnostic test results will improve Outcome: Progressing Goal: Respiratory complications will improve Outcome: Progressing Goal: Cardiovascular complication will be avoided Outcome: Progressing   Problem: Activity: Goal: Risk for activity intolerance will decrease Outcome: Progressing   Problem: Nutrition: Goal: Adequate nutrition will be maintained Outcome: Progressing   Problem: Coping: Goal: Level of anxiety will decrease Outcome: Progressing   Problem: Elimination: Goal: Will not experience complications related to bowel motility Outcome: Progressing Goal: Will not experience complications related to urinary retention Outcome: Progressing   Problem: Pain Managment: Goal: General experience of comfort will improve Outcome: Progressing   Problem: Safety: Goal: Ability to remain free from injury will improve Outcome: Progressing   Problem: Skin Integrity: Goal: Risk for impaired skin integrity will decrease Outcome: Progressing

## 2022-05-27 NOTE — Progress Notes (Signed)
Physical Therapy Treatment Patient Details Name: Eileen Wilkerson MRN: [id number] DOB: 1932-07-31 Today's Date: 05/27/2022   History of Present Illness 88 yo female admitted with UTI, sepsis, diarrhea. Hx of RA, fibromyalgia, hypothyroidism, DDD, asthma,    PT Comments    General Comments: AxO x 3 pleasant Lady who resides at [facility] "Independent Living" stated pt approx three years.  Assisted OOB was difficult.  General bed mobility comments: pt required increased asisst this session due to increased pain R LE.  New DVT on meds. General transfer comment: pt was unable to self rise from even an elevated bed due to Max c/o fatigue (sepsis) and R LE pain (DVT).  Pt required + 2 assist to rise from bed and complete 1/4 pivot BSC as pt was wet from pure wick. General Gait Details: pt was unable to amb this session due to MAX c/o weakness/fatigue and R LE pain.  "I can't stand on my leg". Assisted back to bed required + 2 assist and positioned to comfort. Family wants pt to D/C to ALF level however she needs ST Rehab at SNF prior to address her lack of mobility.   Recommendations for follow up therapy are one component of a multi-disciplinary discharge planning process, led by the attending physician.  Recommendations may be updated based on patient status, additional functional criteria and insurance authorization.  Follow Up Recommendations  Skilled nursing-short term rehab (<3 hours/day)     Assistance Recommended at Discharge PRN  Patient can return home with the following A little help with walking and/or transfers;A little help with bathing/dressing/bathroom;Assistance with cooking/housework;Assist for transportation;Help with stairs or ramp for entrance   Equipment Recommendations  None recommended by PT    Recommendations for Other Services       Precautions / Restrictions Precautions Precautions: Fall Precaution Comments: new R LE DVT posterior Tibial Restrictions Weight  Bearing Restrictions: No     Mobility  Bed Mobility Overal bed mobility: Needs Assistance Bed Mobility: Supine to Sit, Sit to Supine     Supine to sit: Mod assist Sit to supine: Max assist   General bed mobility comments: pt required increased asisst this session due to increased pain R LE.  New DVT on meds.    Transfers Overall transfer level: Needs assistance Equipment used: Rolling walker (2 wheels) Transfers: Sit to/from Stand, Bed to chair/wheelchair/BSC Sit to Stand: Max assist, +2 safety/equipment           General transfer comment: pt was unable to self rise from even an elevated bed due to Max c/o fatigue (sepsis) and R LE pain (DVT).  Pt required + 2 assist to rise from bed and complete 1/4 pivot BSC as pt was wet from pure wick.    Ambulation/Gait         Gait velocity: decreased     General Gait Details: pt was unable to amb this session due to MAX c/o weakness/fatigue and R LE pain.  "I can't stand on my leg".   Stairs             Wheelchair Mobility    Modified Rankin (Stroke Patients Only)       Balance                                            Cognition Arousal/Alertness: Awake/alert Behavior During Therapy: WFL for tasks assessed/performed  General Comments: AxO x 3 pleasant Lady who resides at [facility] "Independent Living" stated pt approx three years.        Exercises      General Comments        Pertinent Vitals/Pain Pain Assessment Faces Pain Scale: Hurts even more Pain Location: R LE/ankle/foot Pain Descriptors / Indicators: Sore, Tender, Grimacing Pain Intervention(s): Monitored during session, Repositioned    Home Living                          Prior Function            PT Goals (current goals can now be found in the care plan section) Progress towards PT goals: Progressing toward goals    Frequency    Min  3X/week      PT Plan Current plan remains appropriate    Co-evaluation              AM-PAC PT "6 Clicks" Mobility   Outcome Measure  Help needed turning from your back to your side while in a flat bed without using bedrails?: A Lot Help needed moving from lying on your back to sitting on the side of a flat bed without using bedrails?: A Lot Help needed moving to and from a bed to a chair (including a wheelchair)?: A Lot Help needed standing up from a chair using your arms (e.g., wheelchair or bedside chair)?: A Lot Help needed to walk in hospital room?: A Lot Help needed climbing 3-5 steps with a railing? : Total 6 Click Score: 11    End of Session Equipment Utilized During Treatment: Gait belt Activity Tolerance: Patient limited by fatigue;Patient limited by pain Patient left: in bed;with bed alarm set;with call bell/phone within reach Nurse Communication: Mobility status (required + 2 asisst) PT Visit Diagnosis: Muscle weakness (generalized) (M62.81);Difficulty in walking, not elsewhere classified (R26.2)     Time: 8099-8338 PT Time Calculation (min) (ACUTE ONLY): 30 min  Charges:  $Therapeutic Activity: 23-37 mins                    Rica Koyanagi  PTA Acute  Rehabilitation Services Pager      [phone] Office      [phone]

## 2022-05-27 NOTE — Progress Notes (Signed)
Progress Note Patient: Eileen Wilkerson MRN:[id number] DOB: 10/11/1932 DOA: 05/21/2022  DOS: the patient was seen and examined on 05/27/2022  Brief hospital course: History of asthma, fibromyalgia, rheumatoid arthritis on chronic steroids presents to the hospital with complaints of diarrhea found to have E. coli UTI and bacteremia leading to septic shock and adrenal crisis. Assessment and Plan: Septic shock secondary to E. coli UTI and bacteremia. Severe diarrhea. Adrenal crisis. Presented with severe diarrhea.  Met SIRS criteria on admission with fever, tachycardia and tachypnea. Due to persistent hypotension on 6/12 was transferred to the ICU. Started on peripheral pressors, Along with stress dose steroids. Blood pressure improved significantly. Now off of the pressors. Blood cultures and urine culture growing E. coli.  Diarrhea work-up negative for any active infection. Was initially on IV cefepime and Flagyl, later on IV ceftriaxone and Flagyl. Patient tolerated amoxicillin challenge without any issue. Given that the patient is able to tolerate p.o. diet I will transition to oral cefadroxil for a total of 5 more days.   Rheumatoid arthritis. Fibromyalgia. On chronic prednisone. Treated with stress dose steroids. Now back on home regimen.   Acute right distal posterior tibial DVT. Patient reported some ankle pain and her Doppler is positive for DVT. We will treat with oral anticoagulation as patient remains at high risk for clot progression due to her weakness. Discussed anticoagulation options with patient as well as daughter and they decided to initiate with Eliquis.  Possible gout. Right ankle actually appears to be his swollen as well as warm and red. Do not think the patient is suffering from cellulitis and patient is actually on antibiotic for infection. We will initiate colchicine therapy. Switch chronic pain control medication tramadol to oxycodone.   Chronic diastolic  CHF. Hypertension Echocardiogram shows EF of 60 to 65%.  No wall motion normality. At home on Lasix currently on hold.  Resume Lasix.  Patient is on 80 daily we will switch to 40 twice daily. Patient was actually hypotensive.  Now blood pressure is rather elevated.  We will monitor response to Lasix.   Hypokalemia. Replaced. Monitor.   Acute kidney injury Treated with IV fluids. Monitor.   Macrocytic anemia. Mild thrombocytopenia. Likely dilutional component for drop in the hospital but now hemoglobin stable. B12 level normal.   Obesity Body mass index is 31.28 kg/m.  Placing the pt at higher risk of poor outcomes.   Deconditioning. Physical therapy worked with the patient, patient was able to will ambulate for 50 feet. Recommended SNF. Patient wants to go home back to her ALF with home health.  Patient will be arranging 24-hour assistance.  Subjective: Continues to report severe pain in the right ankle.  No nausea or vomiting.  No fever no chills.  No chest pain.  No abdominal pain.  No diarrhea.  Physical Exam: Vitals:   05/26/22 2017 05/27/22 0423 05/27/22 1252 05/27/22 1445  BP: 119/69 (!) 149/84 (!) 160/90   Pulse: 79 70 (!) 102   Resp: 16 16 15   Temp: 97.7 F (36.5 C) 98.8 F (37.1 C) 100 F (37.8 C) 98.3 F (36.8 C)  TempSrc: Oral Oral Oral Oral  SpO2: 99% 100% 95%   Weight:      Height:       General: Appear in mild distress; no visible Abnormal Neck Mass Or lumps, Conjunctiva normal Cardiovascular: S1 and S2 Present, no Murmur, Respiratory: good respiratory effort, Bilateral Air entry present and CTA, no Crackles, n wheezes Abdomen: Bowel Sound present,   Non tender  Extremities: bilateral  Pedal edema Neurology: alert and oriented to time, place, and person  Gait not checked due to patient safety concerns   Data Reviewed: I have Reviewed nursing notes, Vitals, and Lab results since pt's last encounter. Pertinent lab results CBC and BMP I have ordered  test including BMP and magnesium    Family Communication: None at bedside  Disposition: Status is: Inpatient Remains inpatient appropriate because: Monitor while initiating Lasix therapy as well as pain control with aggressive narcotic regimen.  Low-grade temp after transitioning to p.o. antibiotics. Author:  , MD 05/27/2022 7:03 PM  Please look on [email or web address] to find out who is on call. 

## 2022-05-28 DIAGNOSIS — R7881 Bacteremia: Secondary | ICD-10-CM | POA: Diagnosis not present

## 2022-05-28 DIAGNOSIS — B9620 Unspecified Escherichia coli [E. coli] as the cause of diseases classified elsewhere: Secondary | ICD-10-CM | POA: Diagnosis not present

## 2022-05-28 LAB — BASIC METABOLIC PANEL
Anion gap: 11 (ref 5–15)
BUN: 9 mg/dL (ref 8–23)
CO2: 30 mmol/L (ref 22–32)
Calcium: 9.5 mg/dL (ref 8.9–10.3)
Chloride: 99 mmol/L (ref 98–111)
Creatinine, Ser: 0.83 mg/dL (ref 0.44–1.00)
GFR, Estimated: >60 mL/min (ref >60)
Glucose, Bld: 114 mg/dL — ABNORMAL HIGH (ref 70–99)
Potassium: 3.3 mmol/L — ABNORMAL LOW (ref 3.5–5.1)
Sodium: 140 mmol/L (ref 135–145)

## 2022-05-28 LAB — CBC
HCT: 40.1 % (ref 36.0–46.0)
Hemoglobin: 12.4 g/dL (ref 12.0–15.0)
MCH: 31.2 pg (ref 26.0–34.0)
MCHC: 30.9 g/dL (ref 30.0–36.0)
MCV: 100.8 fL — ABNORMAL HIGH (ref 80.0–100.0)
Platelets: 341 10*3/uL (ref 150–400)
RBC: 3.98 MIL/uL (ref 3.87–5.11)
RDW: 14.5 % (ref 11.5–15.5)
WBC: 13.4 10*3/uL — ABNORMAL HIGH (ref 4.0–10.5)
nRBC: 0 % (ref 0.0–0.2)

## 2022-05-28 LAB — MAGNESIUM: Magnesium: 2 mg/dL (ref 1.7–2.4)

## 2022-05-28 MED ORDER — FLUCONAZOLE 200 MG PO TABS
200.0000 mg | ORAL_TABLET | Freq: Once | ORAL | Status: AC
  Administered 2022-05-28: 200 mg via ORAL
  Filled 2022-05-28: qty 1

## 2022-05-28 MED ORDER — POTASSIUM CHLORIDE CRYS ER 20 MEQ PO TBCR: 40.0000 meq | EXTENDED_RELEASE_TABLET | ORAL | Status: DC

## 2022-05-28 MED ORDER — POTASSIUM CHLORIDE 20 MEQ PO PACK
40.0000 meq | PACK | Freq: Once | ORAL | Status: AC
Start: 2022-05-28 — End: 2022-05-28
  Administered 2022-05-28: 40 meq via ORAL
  Filled 2022-05-28: qty 2

## 2022-05-28 MED ORDER — METHYLPREDNISOLONE SODIUM SUCC 40 MG IJ SOLR
40.0000 mg | Freq: Once | INTRAMUSCULAR | Status: AC
  Administered 2022-05-28: 40 mg via INTRAVENOUS
  Filled 2022-05-28: qty 1

## 2022-05-28 MED ORDER — FUROSEMIDE 40 MG PO TABS: 40.0000 mg | ORAL_TABLET | Freq: Every day | ORAL | Status: DC

## 2022-05-28 MED ORDER — FLUCONAZOLE 100 MG PO TABS
100.0000 mg | ORAL_TABLET | Freq: Every day | ORAL | Status: DC
  Administered 2022-05-29 – 2022-05-30 (×2): 100 mg via ORAL
  Filled 2022-05-28 (×2): qty 1

## 2022-05-28 MED ORDER — MAGIC MOUTHWASH
5.0000 mL | Freq: Four times a day (QID) | ORAL | Status: DC
  Administered 2022-05-28 – 2022-05-30 (×7): 5 mL via ORAL
  Filled 2022-05-28 (×11): qty 5

## 2022-05-28 MED ORDER — TRAMADOL HCL 50 MG PO TABS
50.0000 mg | ORAL_TABLET | Freq: Four times a day (QID) | ORAL | Status: DC | PRN
  Administered 2022-05-28 – 2022-05-29 (×2): 50 mg via ORAL
  Filled 2022-05-28 (×2): qty 1

## 2022-05-28 NOTE — Progress Notes (Signed)
Progress Note Patient: Eileen Wilkerson XBD:[id number] DOB: 1932/07/21 DOA: 05/21/2022  DOS: the patient was seen and examined on 05/28/2022  Brief hospital course: History of asthma, fibromyalgia, rheumatoid arthritis on chronic steroids presents to the hospital with complaints of diarrhea found to have E. coli UTI and bacteremia leading to septic shock and adrenal crisis. Assessment and Plan: Septic shock secondary to E. coli UTI and bacteremia. Severe diarrhea. Adrenal crisis. Presented with severe diarrhea.  Met SIRS criteria on admission with fever, tachycardia and tachypnea. Due to persistent hypotension on 6/12 was transferred to the ICU. Started on peripheral pressors, Along with stress dose steroids. Blood pressure improved significantly. Now off of the pressors. Blood cultures and urine culture growing E. coli.  Diarrhea work-up negative for any active infection. Was initially on IV cefepime and Flagyl, later on IV ceftriaxone and Flagyl. Patient tolerated amoxicillin challenge without any issue. Given that the patient is able to tolerate p.o. diet I will transition to oral cefadroxil for a total of 5 more days.   Rheumatoid arthritis. Fibromyalgia. On chronic prednisone. Treated with stress dose steroids. Now back on home regimen.   Acute right distal posterior tibial DVT. Patient reported some ankle pain and her Doppler is positive for DVT. We will treat with oral anticoagulation as patient remains at high risk for clot progression due to her weakness. Discussed anticoagulation options with patient as well as daughter and they decided to initiate with Eliquis.   Possible gout. Right ankle actually appears to be his swollen as well as warm and red. Do not think the patient is suffering from cellulitis and patient is actually on antibiotic for infection. We will initiate colchicine therapy. Switch chronic pain control medication tramadol to oxycodone.   Chronic diastolic  CHF. Hypertension Echocardiogram shows EF of 60 to 65%.  No wall motion normality. At home on Lasix currently on hold.  Resume Lasix.  Patient is on 80 daily we will switch to 40 twice daily. Patient was actually hypotensive.  Now blood pressure is rather elevated.  We will monitor response to Lasix.   Hypokalemia. Replaced. Monitor.   Acute kidney injury Treated with IV fluids. Monitor.   Macrocytic anemia. Mild thrombocytopenia. Likely dilutional component for drop in the hospital but now hemoglobin stable. B12 level normal.   Obesity Body mass index is 31.28 kg/m.  Placing the pt at higher risk of poor outcomes.   Deconditioning. Physical therapy worked with the patient, patient was able to will ambulate for 50 feet. Recommended SNF. Patient wants to go home back to her ALF with home health.  Patient will be arranging 24-hour assistance.  Subjective: Continues to report severe pain in his right ankle.  No nausea no vomiting.  No fever no chills.  Daughter at bedside reports that the patient is somewhat more confused after starting oxycodone.  Physical Exam: Vitals:   05/28/22 0432 05/28/22 1039 05/28/22 1202 05/28/22 1321  BP: (!) 149/82 (!) 115/57 134/68 (!) 104/58  Pulse: (!) 108 97 (!) 105 98  Resp: '18 20  20  ' Temp: 99.8 F (37.7 C) 98.5 F (36.9 C) 98.9 F (37.2 C) 98 F (36.7 C)  TempSrc: Oral Oral Oral Oral  SpO2: 94% 96% 94% 97%  Weight:      Height:       General: Appear in moderate distress; no visible Abnormal Neck Mass Or lumps, Conjunctiva normal Cardiovascular: S1 and S2 Present, no Murmur, Respiratory: good respiratory effort, Bilateral Air entry present and CTA, no  Crackles, no wheezes Abdomen: Bowel Sound present, Non tender  Extremities: Trace bilateral pedal edema Neurology: alert and oriented to time, place, and person  Gait not checked due to patient safety concerns   Data Reviewed: I have Reviewed nursing notes, Vitals, and Lab results  since pt's last encounter. Pertinent lab results CBC and BMP I have ordered test including CBC and BMP    Family Communication: Daughter at bedside  Disposition: Status is: Inpatient Remains inpatient appropriate because: Severe pain, confusion and worsening leukocytosis.  Monitoring for now.  Author: Berle Mull, MD 05/28/2022 7:39 PM  Please look on [email or web address] to find out who is on call.

## 2022-05-28 NOTE — Progress Notes (Signed)
Pharmacy Consult - Fluconazole Indication - Oropharangeal candidiasis  Due to potential drug interactions with both atorvastatin and colchicine, will use lowest effective dose recommendation:  Fluconazole '200mg'$  PO x 1, then '100mg'$  PO daily x 6 days.  Peggyann Juba, PharmD, BCPS Pharmacy: [phone] 05/28/2022 8:38 AM

## 2022-05-28 NOTE — Progress Notes (Signed)
RN called to room by pts daughter. Pt is more lethargic than this morning. Pt has complaints of 8/10 pain in her left leg. Pt's HR slightly elevated at 105 and her oxygen decreased to 86% on room air. RN placed pt on 2L O2 and administered prn tylenol. Agricultural consultant at bedside. MD made aware.

## 2022-05-28 NOTE — Plan of Care (Signed)
  Problem: Education: Goal: Knowledge of General Education information will improve Description: Including pain rating scale, medication(s)/side effects and non-pharmacologic comfort measures Outcome: Progressing   Problem: Health Behavior/Discharge Planning: Goal: Ability to manage health-related needs will improve Outcome: Progressing   Problem: Clinical Measurements: Goal: Ability to maintain clinical measurements within normal limits will improve Outcome: Progressing Goal: Will remain free from infection Outcome: Progressing Goal: Diagnostic test results will improve Outcome: Progressing Goal: Respiratory complications will improve Outcome: Progressing Goal: Cardiovascular complication will be avoided Outcome: Progressing   Problem: Activity: Goal: Risk for activity intolerance will decrease Outcome: Progressing   Problem: Nutrition: Goal: Adequate nutrition will be maintained Outcome: Progressing   Problem: Coping: Goal: Level of anxiety will decrease Outcome: Progressing   Problem: Elimination: Goal: Will not experience complications related to bowel motility Outcome: Progressing Goal: Will not experience complications related to urinary retention Outcome: Progressing   Problem: Pain Managment: Goal: General experience of comfort will improve Outcome: Progressing   Problem: Safety: Goal: Ability to remain free from injury will improve Outcome: Progressing   Problem: Skin Integrity: Goal: Risk for impaired skin integrity will decrease Outcome: Progressing   Problem: Fluid Volume: Goal: Hemodynamic stability will improve Outcome: Progressing   Problem: Clinical Measurements: Goal: Diagnostic test results will improve Outcome: Progressing Goal: Signs and symptoms of infection will decrease Outcome: Progressing   Problem: Respiratory: Goal: Ability to maintain adequate ventilation will improve Outcome: Progressing   Problem: Education: Goal:  Knowledge of General Education information will improve Description: Including pain rating scale, medication(s)/side effects and non-pharmacologic comfort measures Outcome: Progressing   Problem: Health Behavior/Discharge Planning: Goal: Ability to manage health-related needs will improve Outcome: Progressing   Problem: Clinical Measurements: Goal: Ability to maintain clinical measurements within normal limits will improve Outcome: Progressing Goal: Will remain free from infection Outcome: Progressing Goal: Diagnostic test results will improve Outcome: Progressing Goal: Respiratory complications will improve Outcome: Progressing Goal: Cardiovascular complication will be avoided Outcome: Progressing   Problem: Activity: Goal: Risk for activity intolerance will decrease Outcome: Progressing   Problem: Nutrition: Goal: Adequate nutrition will be maintained Outcome: Progressing   Problem: Coping: Goal: Level of anxiety will decrease Outcome: Progressing   Problem: Elimination: Goal: Will not experience complications related to bowel motility Outcome: Progressing Goal: Will not experience complications related to urinary retention Outcome: Progressing   Problem: Pain Managment: Goal: General experience of comfort will improve Outcome: Progressing   Problem: Safety: Goal: Ability to remain free from injury will improve Outcome: Progressing   Problem: Skin Integrity: Goal: Risk for impaired skin integrity will decrease Outcome: Progressing

## 2022-05-29 DIAGNOSIS — R7881 Bacteremia: Secondary | ICD-10-CM | POA: Diagnosis not present

## 2022-05-29 DIAGNOSIS — B9620 Unspecified Escherichia coli [E. coli] as the cause of diseases classified elsewhere: Secondary | ICD-10-CM | POA: Diagnosis not present

## 2022-05-29 LAB — CBC
HCT: 35.8 % — ABNORMAL LOW (ref 36.0–46.0)
Hemoglobin: 11.3 g/dL — ABNORMAL LOW (ref 12.0–15.0)
MCH: 31.4 pg (ref 26.0–34.0)
MCHC: 31.6 g/dL (ref 30.0–36.0)
MCV: 99.4 fL (ref 80.0–100.0)
Platelets: 356 10*3/uL (ref 150–400)
RBC: 3.6 MIL/uL — ABNORMAL LOW (ref 3.87–5.11)
RDW: 14.3 % (ref 11.5–15.5)
WBC: 14.8 10*3/uL — ABNORMAL HIGH (ref 4.0–10.5)
nRBC: 0 % (ref 0.0–0.2)

## 2022-05-29 LAB — BASIC METABOLIC PANEL
Anion gap: 9 (ref 5–15)
BUN: 20 mg/dL (ref 8–23)
CO2: 29 mmol/L (ref 22–32)
Calcium: 9.5 mg/dL (ref 8.9–10.3)
Chloride: 102 mmol/L (ref 98–111)
Creatinine, Ser: 0.85 mg/dL (ref 0.44–1.00)
GFR, Estimated: >60 mL/min (ref >60)
Glucose, Bld: 178 mg/dL — ABNORMAL HIGH (ref 70–99)
Potassium: 4.1 mmol/L (ref 3.5–5.1)
Sodium: 140 mmol/L (ref 135–145)

## 2022-05-29 LAB — MAGNESIUM: Magnesium: 2.3 mg/dL (ref 1.7–2.4)

## 2022-05-29 MED ORDER — FUROSEMIDE 40 MG PO TABS
40.0000 mg | ORAL_TABLET | Freq: Two times a day (BID) | ORAL | Status: DC
Start: 2022-05-29 — End: 2022-05-30
  Administered 2022-05-29 – 2022-05-30 (×3): 40 mg via ORAL
  Filled 2022-05-29 (×3): qty 1

## 2022-05-29 MED ORDER — PREDNISONE 20 MG PO TABS
40.0000 mg | ORAL_TABLET | Freq: Every day | ORAL | Status: DC
  Administered 2022-05-30: 40 mg via ORAL
  Filled 2022-05-29: qty 2

## 2022-05-29 NOTE — TOC Progression Note (Addendum)
Transition of Care [facility]) - Progression Note    Patient Details  Name: Eileen Wilkerson MRN: [id number] Date of Birth: [birth year]  [facility] CM/SW [place], LCSW Phone Number: 05/29/2022, 8:57 AM  Clinical Narrative:    CSW received phone call from [facility] with [facility] who shared that they are working to have a bed for this pt in the SNF. This pt is able to discharge to the [facility] on 05/30/22. Nursing will call report to [phone].  CSW spoke with pt's daughter to relay the information. Pt's daughter is agreeable to this plan.  CSW spoke with pt to share this information. Pt is agreeable to plan to return to [facility] SNF.   Expected Discharge Plan: [place] Barriers to Discharge: Continued Medical Work up  Expected Discharge Plan and Services Expected Discharge Plan: [place]   Discharge Planning Services: CM Consult   Living arrangements for the past 2 months: Apartment                                       Social Determinants of Health (SDOH) Interventions    Readmission Risk Interventions     No data to display

## 2022-05-29 NOTE — Progress Notes (Signed)
Progress Note Patient: Eileen Wilkerson:[id number] DOB: 1932-10-05 DOA: 05/21/2022  DOS: the patient was seen and examined on 05/29/2022  Brief hospital course: History of asthma, fibromyalgia, rheumatoid arthritis on chronic steroids presents to the hospital with complaints of diarrhea found to have E. coli UTI and bacteremia leading to septic shock and adrenal crisis. Assessment and Plan: Septic shock secondary to E. coli UTI and bacteremia. Severe diarrhea. Adrenal crisis. Presented with severe diarrhea.  Met SIRS criteria on admission with fever, tachycardia and tachypnea. Due to persistent hypotension on 6/12 was transferred to the ICU. Started on peripheral pressors, Along with stress dose steroids. Blood pressure improved significantly. Now off of the pressors. Blood cultures and urine culture growing E. coli.  Diarrhea work-up negative for any active infection. Was initially on IV cefepime and Flagyl, later on IV ceftriaxone and Flagyl. Patient tolerated amoxicillin challenge without any issue. Given that the patient is able to tolerate p.o. diet I will transition to oral cefadroxil for a total of 5 more days.   Rheumatoid arthritis. Fibromyalgia. On chronic prednisone. Treated with stress dose steroids. Now back on home regimen.   Acute right distal posterior tibial DVT. Patient reported some ankle pain and her Doppler is positive for DVT. We will treat with oral anticoagulation as patient remains at high risk for clot progression due to her weakness. Discussed anticoagulation options with patient as well as daughter and they decided to initiate with Eliquis.   Possible gout. Right ankle actually appears to be his swollen as well as warm and red. Do not think the patient is suffering from cellulitis and patient is actually on antibiotic for infection. We will initiate colchicine therapy.  Also provide higher dose of prednisone taper for short duration to assist  colchicine. Switch chronic pain control medication tramadol to oxycodone.   Chronic diastolic CHF. Hypertension Echocardiogram shows EF of 60 to 65%.  No wall motion normality. At home on Lasix currently on hold.  Resume Lasix.  Patient is on 80 daily we will switch to 40 twice daily. Patient was actually hypotensive.  Now blood pressure is rather elevated.  We will monitor response to Lasix.   Hypokalemia. Replaced. Monitor.   Acute kidney injury Treated with IV fluids. Monitor.   Macrocytic anemia. Mild thrombocytopenia. Likely dilutional component for drop in the hospital but now hemoglobin stable. B12 level normal.   Obesity Body mass index is 31.28 kg/m.  Placing the pt at higher risk of poor outcomes.   Deconditioning. Physical therapy worked with the patient, patient was able to will ambulate for 50 feet. Recommended SNF. Patient wants to go home back to her ALF with home health.  Patient will be arranging 24-hour assistance.  Possible PVD. Check ABI.  Subjective: Continues to report pain in the leg.  Painful appears to be more pins-and-needles like pain on the plantar surface area.  No nausea no vomiting.  Physical Exam: Vitals:   05/28/22 1321 05/28/22 2023 05/29/22 0615 05/29/22 1316  BP: (!) 104/58 122/73 132/75 116/67  Pulse: 98 81 69 93  Resp: _0 Temp: 98 F (36.7 C) 98 F (36.7 C) 98.4 F (36.9 C) 97.6 F (36.4 C)  TempSrc: Oral Oral Oral Oral  SpO2: 97% 98% 98% 96%  Weight:      Height:       General: Appear in mild distress; no visible Abnormal Neck Mass Or lumps, Conjunctiva normal Cardiovascular: S1 and S2 Present, no Murmur, Respiratory: good respiratory effort,  Bilateral Air entry present and CTA, no Crackles, no wheezes Abdomen: Bowel Sound present, Non tender  Extremities: no Pedal edema Neurology: alert and oriented to time, place, and person  Gait not checked due to patient safety concerns   Data Reviewed: I have Reviewed  nursing notes, Vitals, and Lab results since pt's last encounter. Pertinent lab results CBC and CMP I have ordered test including CBC and BMP I have ordered imaging studies vascular ABI Dopplers.   Family Communication: None at bedside  Disposition: Status is: Inpatient Remains inpatient appropriate because: Further work-up needed for ongoing ankle pain and pain control.  Author: Berle Mull, MD 05/29/2022 8:14 PM  Please look on [email or web address] to find out who is on call.

## 2022-05-29 NOTE — Care Management Important Message (Signed)
Important Message  Patient Details IM Letter placed in Patients room. Name: Eileen Wilkerson MRN: [id number] Date of Birth: Mar 31, 1932   Medicare Important Message Given:  Yes     Kerin Salen 05/29/2022, 12:22 PM

## 2022-05-29 NOTE — Progress Notes (Signed)
Physical Therapy Treatment Patient Details Name: Eileen Wilkerson MRN: [id number] DOB: 14-Aug-1932 Today's Date: 05/29/2022   History of Present Illness 88 yo female admitted with UTI, sepsis, diarrhea. Hx of RA, fibromyalgia, hypothyroidism, DDD, asthma,    PT Comments    Pt assisted to sitting EOB.  Pt reports inability to weight bear due to pain in left foot and ankle.  Pt also reports increased knee pain with flexion.  Pt performed a couple exercises sitting EOB as tolerated..  Continue to recommend SNF upon d/c.  SPO2 98% on room air end of session,  so pt left off of O2 [place] and RN aware.   Recommendations for follow up therapy are one component of a multi-disciplinary discharge planning process, led by the attending physician.  Recommendations may be updated based on patient status, additional functional criteria and insurance authorization.  Follow Up Recommendations  Skilled nursing-short term rehab (<3 hours/day)     Assistance Recommended at Discharge PRN  Patient can return home with the following A little help with walking and/or transfers;A little help with bathing/dressing/bathroom;Assistance with cooking/housework;Assist for transportation;Help with stairs or ramp for entrance   Equipment Recommendations  None recommended by PT    Recommendations for Other Services       Precautions / Restrictions Precautions Precautions: Fall Precaution Comments: new R LE DVT     Mobility  Bed Mobility Overal bed mobility: Needs Assistance Bed Mobility: Supine to Sit, Sit to Supine     Supine to sit: Max assist Sit to supine: Mod assist   General bed mobility comments: requiring most assist for lower body and positioning EOB with bed pad    Transfers                   General transfer comment: pt felt unable to weight bear on either LE    Ambulation/Gait                   Stairs             Wheelchair Mobility    Modified Rankin (Stroke  Patients Only)       Balance                                            Cognition Arousal/Alertness: Awake/alert Behavior During Therapy: WFL for tasks assessed/performed Overall Cognitive Status: Within Functional Limits for tasks assessed                                          Exercises General Exercises - Lower Extremity Ankle Circles/Pumps: AROM, Right, 10 reps Long Arc Quad: AAROM, Left, 10 reps, Limitations, AROM Long Arc Quad Limitations: pt required assist on left LE due to knee pain Hip Flexion/Marching: AROM, Both, 10 reps, AAROM, Limitations Hip Flexion/Marching Limitations: pt required assist on left LE due to knee pain    General Comments        Pertinent Vitals/Pain Pain Assessment Pain Assessment: Faces Faces Pain Scale: Hurts even more Pain Location: L ankle/foot with movement or weight bearing, left knee with flexion; right calf (DVT) Pain Descriptors / Indicators: Sore, Tender, Grimacing Pain Intervention(s): Repositioned, Monitored during session    Home Living  Prior Function            PT Goals (current goals can now be found in the care plan section) Progress towards PT goals: Progressing toward goals    Frequency    Min 3X/week      PT Plan Current plan remains appropriate    Co-evaluation              AM-PAC PT "6 Clicks" Mobility   Outcome Measure  Help needed turning from your back to your side while in a flat bed without using bedrails?: A Lot Help needed moving from lying on your back to sitting on the side of a flat bed without using bedrails?: A Lot Help needed moving to and from a bed to a chair (including a wheelchair)?: A Lot Help needed standing up from a chair using your arms (e.g., wheelchair or bedside chair)?: A Lot Help needed to walk in hospital room?: A Lot Help needed climbing 3-5 steps with a railing? : Total 6 Click Score: 11     End of Session   Activity Tolerance: Patient tolerated treatment well Patient left: in bed;with call bell/phone within reach;with bed alarm set Nurse Communication: Mobility status PT Visit Diagnosis: Muscle weakness (generalized) (M62.81);Difficulty in walking, not elsewhere classified (R26.2)     Time: 6811-5726 PT Time Calculation (min) (ACUTE ONLY): 24 min  Charges:  $Therapeutic Exercise: 8-22 mins $Therapeutic Activity: 8-22 mins                     Jannette Spanner PT, DPT Acute Rehabilitation Services Pager: [phone] Office: [place] 05/29/2022, 3:44 PM

## 2022-05-30 ENCOUNTER — Encounter (HOSPITAL_COMMUNITY): Payer: Medicare PPO

## 2022-05-30 DIAGNOSIS — R7881 Bacteremia: Secondary | ICD-10-CM | POA: Diagnosis not present

## 2022-05-30 DIAGNOSIS — B9620 Unspecified Escherichia coli [E. coli] as the cause of diseases classified elsewhere: Secondary | ICD-10-CM | POA: Diagnosis not present

## 2022-05-30 LAB — BASIC METABOLIC PANEL
Anion gap: 9 (ref 5–15)
BUN: 24 mg/dL — ABNORMAL HIGH (ref 8–23)
CO2: 30 mmol/L (ref 22–32)
Calcium: 9.1 mg/dL (ref 8.9–10.3)
Chloride: 104 mmol/L (ref 98–111)
Creatinine, Ser: 0.87 mg/dL (ref 0.44–1.00)
GFR, Estimated: >60 mL/min (ref >60)
Glucose, Bld: 157 mg/dL — ABNORMAL HIGH (ref 70–99)
Potassium: 3.9 mmol/L (ref 3.5–5.1)
Sodium: 143 mmol/L (ref 135–145)

## 2022-05-30 LAB — CBC
HCT: 34 % — ABNORMAL LOW (ref 36.0–46.0)
Hemoglobin: 10.5 g/dL — ABNORMAL LOW (ref 12.0–15.0)
MCH: 31.3 pg (ref 26.0–34.0)
MCHC: 30.9 g/dL (ref 30.0–36.0)
MCV: 101.5 fL — ABNORMAL HIGH (ref 80.0–100.0)
Platelets: 378 10*3/uL (ref 150–400)
RBC: 3.35 MIL/uL — ABNORMAL LOW (ref 3.87–5.11)
RDW: 14.2 % (ref 11.5–15.5)
WBC: 14.7 10*3/uL — ABNORMAL HIGH (ref 4.0–10.5)
nRBC: 0 % (ref 0.0–0.2)

## 2022-05-30 LAB — MAGNESIUM: Magnesium: 2.2 mg/dL (ref 1.7–2.4)

## 2022-05-30 MED ORDER — PREDNISONE 10 MG PO TABS: 10.0000 mg | ORAL_TABLET | Freq: Every day | ORAL | 5 refills | Status: DC

## 2022-05-30 MED ORDER — TRAMADOL HCL 50 MG PO TABS: 50.0000 mg | ORAL_TABLET | Freq: Four times a day (QID) | ORAL | 0 refills | Status: DC

## 2022-05-30 MED ORDER — SIMETHICONE 80 MG PO CHEW: 80.0000 mg | CHEWABLE_TABLET | Freq: Four times a day (QID) | ORAL | 0 refills | Status: DC | PRN

## 2022-05-30 MED ORDER — FLUCONAZOLE 100 MG PO TABS: 100.0000 mg | ORAL_TABLET | Freq: Every day | ORAL | 0 refills | Status: AC

## 2022-05-30 MED ORDER — MAGIC MOUTHWASH: 5.0000 mL | Freq: Four times a day (QID) | ORAL | 0 refills | Status: AC

## 2022-05-30 MED ORDER — APIXABAN (ELIQUIS) VTE STARTER PACK (10MG AND 5MG): ORAL_TABLET | ORAL | 0 refills | Status: DC

## 2022-05-30 MED ORDER — ENSURE ENLIVE PO LIQD: 237.0000 mL | Freq: Two times a day (BID) | ORAL | 0 refills | Status: DC

## 2022-05-30 MED ORDER — CEFADROXIL 500 MG PO CAPS: 500.0000 mg | ORAL_CAPSULE | Freq: Two times a day (BID) | ORAL | 0 refills | Status: AC

## 2022-05-30 MED ORDER — PREDNISONE 10 MG PO TABS: ORAL_TABLET | ORAL | 0 refills | Status: AC

## 2022-05-30 MED ORDER — COLCHICINE 0.6 MG PO TABS: 0.6000 mg | ORAL_TABLET | Freq: Every day | ORAL | 0 refills | Status: DC

## 2022-05-30 NOTE — Progress Notes (Signed)
Occupational Therapy Treatment Patient Details Name: Eileen Wilkerson MRN: [id number] DOB: 1932/12/10 Today's Date: 05/30/2022   History of present illness 88 yo female admitted with UTI, sepsis, diarrhea. Hx of RA, fibromyalgia, hypothyroidism, DDD, asthma,   OT comments  Patient is a 88 year old female who was admitted for above. Patient needed consistent cues to stay on task during session to remain safe with transfers with RW. Patient was max A for hygiene tasks with inability to maintain standing balance without BUE support. Patient HR noted to increase to 133 bpm with toileting tasks. Patient would continue to benefit from skilled OT services at this time while admitted and after d/c to address noted deficits in order to improve overall safety and independence in ADLs.     Recommendations for follow up therapy are one component of a multi-disciplinary discharge planning process, led by the attending physician.  Recommendations may be updated based on patient status, additional functional criteria and insurance authorization.    Follow Up Recommendations  Skilled nursing-short term rehab (<3 hours/day)    Assistance Recommended at Discharge Frequent or constant Supervision/Assistance  Patient can return home with the following  A lot of help with walking and/or transfers;A lot of help with bathing/dressing/bathroom;Assistance with cooking/housework;Direct supervision/assist for financial management;Assist for transportation;Help with stairs or ramp for entrance;Direct supervision/assist for medications management   Equipment Recommendations  Other (comment) (defer to next venue)    Recommendations for Other Services      Precautions / Restrictions Precautions Precautions: Fall Precaution Comments: new R LE DVT Restrictions Weight Bearing Restrictions: No       Mobility Bed Mobility Overal bed mobility: Needs Assistance Bed Mobility: Supine to Sit, Sit to Supine      Supine to sit: Mod assist, HOB elevated     General bed mobility comments: requiring most assist for lower body and positioning EOB with bed pad    Transfers                         Balance Overall balance assessment: Needs assistance         Standing balance support: Bilateral upper extremity supported, During functional activity, Reliant on assistive device for balance Standing balance-Leahy Scale: Fair                             ADL either performed or assessed with clinical judgement   ADL Overall ADL's : Needs assistance/impaired                         Toilet Transfer: +2 for safety/equipment;Moderate assistance;Rolling walker (2 wheels);BSC/3in1 Toilet Transfer Details (indicate cue type and reason): patient needec increased cues to attend to task and cues to keep toes on the floor. Toileting- Clothing Manipulation and Hygiene: Maximal assistance;Sit to/from stand Toileting - Clothing Manipulation Details (indicate cue type and reason): cues to keep hands on walker to maintain standing balance.            Extremity/Trunk Assessment              Vision       Perception     Praxis      Cognition Arousal/Alertness: Awake/alert Behavior During Therapy: WFL for tasks assessed/performed Overall Cognitive Status: Within Functional Limits for tasks assessed  General Comments: needed repeat instructions during tasks with patient not carrying over education from start of session to end of session        Exercises      Shoulder Instructions       General Comments      Pertinent Vitals/ Pain       Pain Assessment Pain Assessment: Faces Faces Pain Scale: Hurts even more Pain Location: BLE with WB Pain Descriptors / Indicators: Sore, Tender, Grimacing Pain Intervention(s): Monitored during session, Repositioned  Home Living                                           Prior Functioning/Environment              Frequency  Min 2X/week        Progress Toward Goals  OT Goals(current goals can now be found in the care plan section)  Progress towards OT goals: Progressing toward goals     Plan Discharge plan remains appropriate    Co-evaluation                 AM-PAC OT "6 Clicks" Daily Activity     Outcome Measure   Help from another person eating meals?: A Little Help from another person taking care of personal grooming?: A Little Help from another person toileting, which includes using toliet, bedpan, or urinal?: A Lot Help from another person bathing (including washing, rinsing, drying)?: A Lot Help from another person to put on and taking off regular upper body clothing?: A Little Help from another person to put on and taking off regular lower body clothing?: A Lot 6 Click Score: 15    End of Session Equipment Utilized During Treatment: Rolling walker (2 wheels)  OT Visit Diagnosis: Unsteadiness on feet (R26.81);Other abnormalities of gait and mobility (R26.89);Muscle weakness (generalized) (M62.81)   Activity Tolerance Patient tolerated treatment well   Patient Left in bed;with call bell/phone within reach;with bed alarm set   Nurse Communication Mobility status        Time: 3300-7622 OT Time Calculation (min): 16 min  Charges: OT General Charges $OT Visit: 1 Visit OT Treatments $Self Care/Home Management : 8-22 mins  Jackelyn Poling OTR/L, MS Acute Rehabilitation Department Office# [phone] Pager# [phone]   Marcellina Millin 05/30/2022, 12:17 PM

## 2022-05-30 NOTE — Progress Notes (Signed)
Pt discharged to [facility] room 65 via [place]. Discharge instructions placed in packet for facility. RN attempted to call for report twice.

## 2022-05-30 NOTE — Progress Notes (Signed)
RN attempted to call report to [facility] at [facility]. Will attempt to call again.

## 2022-05-30 NOTE — TOC Transition Note (Signed)
Transition of Care [facility]) - CM/SW Discharge Note   Patient Details  Name: Eileen Wilkerson MRN: [id number] Date of Birth: June 25, 1932  [facility]) CM/SW Contact:  Vassie Moselle, LCSW Phone Number: 05/30/2022, 10:10 AM   Clinical Narrative:    Pt is to transfer to [facility] at [facility] room 65. Nurses to call report to [phone]. Both pt and daughter are agreeable to transfer. [facility] to be called for transportation.    Final next level of care: Skilled Nursing Facility Barriers to Discharge: Barriers Resolved   Patient Goals and CMS Choice Patient states their goals for this hospitalization and ongoing recovery are:: To return to [place] CMS Medicare.gov Compare Post Acute Care list provided to:: Patient Choice offered to / list presented to : Patient, Adult Children  Discharge Placement   Existing PASRR number confirmed : 05/23/22          Patient chooses bed at: [facility] Patient to be transferred to facility by: [place] Name of family member notified: Lendon Ka Patient and family notified of of transfer: 05/30/22  Discharge Plan and Services   Discharge Planning Services: CM Consult            DME Arranged: N/A                    Social Determinants of Health ([place]) Interventions     Readmission Risk Interventions    05/30/2022   10:08 AM  Readmission Risk Prevention Plan  Transportation Screening Complete  PCP or Specialist Appt within 5-7 Days Complete  Home Care Screening Complete  Medication Review (RN CM) Complete

## 2022-05-30 NOTE — Discharge Summary (Signed)
Physician Discharge Summary   Patient: Eileen Wilkerson MRN: [id number] DOB: [birth year]  Admit date:     05/21/2022  Discharge date: 05/30/22  Discharge Physician: Berle Mull  PCP: Mast, [name], NP  Recommendations at discharge: Follow up with PCP in 1 week Need ABI in 1 month.   Discharge Diagnoses: Principal Problem:   E coli bacteremia Active Problems:   Rheumatoid arthritis ([place])   Long term current use of systemic steroids, for RA   GERD (gastroesophageal reflux disease)   Age-related osteoporosis without current pathological fracture   Hypokalemia   E. coli UTI   Asthma   UTI (urinary tract infection)   Hyperlipidemia   Sinus tachycardia   Sepsis (HCC)   Diarrhea   DVT (deep venous thrombosis) [facility])  Hospital Course: History of asthma, fibromyalgia, rheumatoid arthritis on chronic steroids presents to the hospital with complaints of diarrhea found to have E. coli UTI and bacteremia leading to septic shock and adrenal crisis.  Assessment and Plan: Septic shock secondary to E. coli UTI and bacteremia. Severe diarrhea. Adrenal crisis. Presented with severe diarrhea. Met SIRS criteria on admission with fever, tachycardia and tachypnea. Due to persistent hypotension on 6/12 was transferred to the ICU. Started on peripheral pressors, Along with stress dose steroids. Blood pressure improved significantly. Now off of the pressors. Blood cultures and urine culture growing E. coli.  Diarrhea work-up negative for any active infection. Was initially on IV cefepime and Flagyl, later on IV ceftriaxone and Flagyl. Patient tolerated amoxicillin challenge without any issue. Continue oral cefadroxil for a total of 5 more days.   Rheumatoid arthritis. Fibromyalgia. On chronic prednisone. Treated with stress dose steroids. Will be back on home regimen 6/26   Acute right distal posterior tibial DVT. Patient reported some ankle pain and her Doppler is positive for DVT. We  will treat with oral anticoagulation as patient remains at high risk for clot progression due to her weakness. Discussed anticoagulation options with patient as well as daughter and they decided to initiate with Eliquis. Started on 05/26/2022.   Possible gout. Right ankle actually appears to be his swollen as well as warm and red. Do not think the patient is suffering from cellulitis and patient is actually on antibiotic for infection. We will initiate colchicine therapy.  Also provide higher dose of prednisone taper for short duration to assist colchicine.   Chronic diastolic CHF. Hypertension Echocardiogram shows EF of 60 to 65%.  No wall motion normality. Resume Lasix.  Hypokalemia. Replaced. Monitor.   Acute kidney injury Treated with IV fluids. Monitor.   Macrocytic anemia. Mild thrombocytopenia. Likely dilutional component for drop in the hospital but now hemoglobin stable. B12 level normal.   Obesity Body mass index is 31.28 kg/m.  Placing the pt at higher risk of poor outcomes.   Deconditioning. Physical therapy worked with the patient, patient was able to will ambulate for 50 feet. Recommended SNF. Patient wants to go home back to her ALF with home health.  Patient will be arranging 24-hour assistance.  Need ABI in month    Pain control - [place] Controlled Substance Reporting System database was reviewed. and patient was instructed, not to drive, operate heavy machinery, perform activities at heights, swimming or participation in water activities or provide baby-sitting services while on Pain, Sleep and Anxiety Medications; until their outpatient Physician has advised to do so again. Also recommended to not to take more than prescribed Pain, Sleep and Anxiety Medications.   Consultants: PCCM primary  admission  Procedures performed:  Echocardiogram  DISCHARGE MEDICATION: Allergies as of 05/30/2022       Reactions   Adhesive [tape] Rash   Celebrex  [celecoxib] Hives   Ciprofibrate Nausea Only   Cymbalta [duloxetine Hcl] Swelling   Gabitril [tiagabine] Swelling   Lyrica [pregabalin] Swelling   Neurontin [gabapentin] Swelling   Nexium [esomeprazole] Rash   Nsaids Rash   Shrimp [shellfish Allergy] Anaphylaxis   Per patient "shrimp only"   Azactam [aztreonam]    Hand swelling    Azelastine Hcl    Rash    Ciprofloxacin Other (See Comments)   dizziness   Claritin [loratadine]    Irritability Nervousness    Methotrexate Derivatives    Nasacort [triamcinolone]    Dizzy    Olopatadine Other (See Comments)   Pain and lethargy    Other    Sulfamethizole Other (See Comments)   unknown   Zantac [ranitidine Hcl] Other (See Comments)   unknown   Claritin-d 12 Hour [loratadine-pseudoephedrine Er] Anxiety   Keflex [cephalexin] Nausea And Vomiting   Tolerated Ancef   Sulfa Antibiotics Nausea And Vomiting        Medication List     TAKE these medications    acetaminophen 500 MG tablet Commonly known as: TYLENOL Take 500 mg by mouth every 6 (six) hours.   Apixaban Starter Pack (24m and 524m Commonly known as: ELIQUIS STARTER PACK Started on 05/26/2022. start with two-50m22mablets twice daily for 3 days. On day 8, switch to one-50mg21mblet twice daily.   atorvastatin 20 MG tablet Commonly known as: LIPITOR TAKE 1 TABLET BY MOUTH EVERY DAY   Biotin 5 MG Tbdp Take 5,000 mcg by mouth daily. What changed: Another medication with the same name was removed. Continue taking this medication, and follow the directions you see here.   cefadroxil 500 MG capsule Commonly known as: DURICEF Take 1 capsule (500 mg total) by mouth 2 (two) times daily for 4 days.   colchicine 0.6 MG tablet Take 1 tablet (0.6 mg total) by mouth daily.   CRANBERRY PO Take 1 capsule by mouth daily.   cycloSPORINE 0.05 % ophthalmic emulsion Commonly known as: RESTASIS Place 1 drop into both eyes 2 (two) times daily.   diclofenac Sodium 1 %  Gel Commonly known as: VOLTAREN APPLY 2 TO 4 GRAMS TOPICALLY TO AFFECTED JOINTS UP TO 4 TIMES DAILY   feeding supplement Liqd Take 237 mLs by mouth 2 (two) times daily between meals.   fexofenadine 180 MG tablet Commonly known as: ALLEGRA Take 180 mg by mouth daily.   fluconazole 100 MG tablet Commonly known as: DIFLUCAN Take 1 tablet (100 mg total) by mouth daily for 7 days.   fluticasone 50 MCG/ACT nasal spray Commonly known as: FLONASE SPRAY 2 SPRAYS INTO EACH NOSTRIL EVERY DAY What changed: See the new instructions.   furosemide 80 MG tablet Commonly known as: LASIX Take 1 tablet by mouth once daily   ketoconazole 2 % shampoo Commonly known as: NIZORAL Apply 1 application topically once a week.   lansoprazole 30 MG capsule Commonly known as: PREVACID TAKE 1 CAPSULE BY MOUTH ONCE DAILY AT NOON What changed:  how much to take how to take this when to take this   magic mouthwash Soln Take 5 mLs by mouth 4 (four) times daily for 14 days.   MELATONIN PO Take 10 mg by mouth daily.   montelukast 10 MG tablet Commonly known as: SINGULAIR TAKE 1 TABLET BY MOUTH AT  BEDTIME   potassium chloride 10 MEQ tablet Commonly known as: KLOR-CON TAKE 2  BY MOUTH TWICE DAILY What changed:  how much to take when to take this   predniSONE 10 MG tablet Commonly known as: DELTASONE Take 30 mg daily for 3 days, Take 20 mg daily for 3 days,Take 10 mg daily What changed: You were already taking a medication with the same name, and this prescription was added. Make sure you understand how and when to take each.   predniSONE 10 MG tablet Commonly known as: DELTASONE Take 1 tablet (10 mg total) by mouth daily with breakfast. Start taking on: June 05, 2022 What changed: These instructions start on June 05, 2022. If you are unsure what to do until then, ask your doctor or other care provider.   simethicone 80 MG chewable tablet Commonly known as: MYLICON Chew 1 tablet (80 mg  total) by mouth 4 (four) times daily as needed for flatulence.   SYRINGE 3CC/25GX1" 25G X 1" 3 ML Misc 1 application by Does not apply route every 30 (thirty) days.   traMADol 50 MG tablet Commonly known as: ULTRAM Take 1 tablet (50 mg total) by mouth 4 (four) times daily.   vitamin B-12 1000 MCG tablet Commonly known as: CYANOCOBALAMIN Take 1 tablet (1,000 mcg total) by mouth daily.   Vitamin D3 125 MCG (5000 UT) Caps Take 5,000 Units by mouth daily.        Contact information for follow-up providers     Mast, [name], NP. Schedule an appointment as soon as possible for a visit in 1 week(s).   Specialty: Internal Medicine Contact information: [address] [address] [phone]              Contact information for after-discharge care     Destination     [facility] .   Service: Skilled Nursing Contact information: [place] [phone]                    Disposition: SNF Diet recommendation: Cardiac diet  Discharge Exam: Vitals:   05/29/22 0615 05/29/22 1316 05/29/22 2057 05/30/22 0602  BP: 132/75 116/67 (!) 141/77 139/67  Pulse: 69 93 72 65  Resp: 18 16 (!) 22 20  Temp: 98.4 F (36.9 C) 97.6 F (36.4 C) 98.4 F (36.9 C) 98.1 F (36.7 C)  TempSrc: Oral Oral Oral Oral  SpO2: 98% 96% 95% 97%  Weight:      Height:       General: Appear in no distress; no visible Abnormal Neck Mass Or lumps, Conjunctiva normal, improving thrush Cardiovascular: S1 and S2 Present, no Murmur, Respiratory: good respiratory effort, Bilateral Air entry present and CTA, no Crackles, no wheezes Abdomen: Bowel Sound present, Non tender  Extremities: no Pedal edema Neurology: alert and oriented to time, place, and person  Gait not checked due to patient safety concerns Filed Weights   05/21/22 1204 05/21/22 2200  Weight: 81.6 kg 80.1 kg   Condition at discharge: stable  The  results of significant diagnostics from this hospitalization (including imaging, microbiology, ancillary and laboratory) are listed below for reference.   Imaging Studies: VAS [place] LOWER EXTREMITY VENOUS (DVT)  Result Date: 05/27/2022  Lower Venous DVT Study Patient Name:  JOHANA HOPKINSON  Date of Exam:   05/26/2022 Medical Rec #: [id number]           Accession #:    [id number]  Date of Birth: 1932/06/04           Patient Gender: F Patient Age:   88 years Exam Location:  [facility] Procedure:      VAS [place] LOWER EXTREMITY VENOUS (DVT) Referring Phys: Beverlyn Mcginness --------------------------------------------------------------------------------  Indications: Edema.  Risk Factors: None identified. Limitations: Poor ultrasound/tissue interface and patient positioning, patient pain tolerance. Comparison Study: No prior studies. Performing Technologist: Oliver Hum RVT  Examination Guidelines: A complete evaluation includes B-mode imaging, spectral Doppler, color Doppler, and power Doppler as needed of all accessible portions of each vessel. Bilateral testing is considered an integral part of a complete examination. Limited examinations for reoccurring indications may be performed as noted. The reflux portion of the exam is performed with the patient in reverse Trendelenburg.  +---------+---------------+---------+-----------+----------+--------------+ RIGHT    CompressibilityPhasicitySpontaneityPropertiesThrombus Aging +---------+---------------+---------+-----------+----------+--------------+ CFV      Full           Yes      Yes                                 +---------+---------------+---------+-----------+----------+--------------+ SFJ      Full                                                        +---------+---------------+---------+-----------+----------+--------------+ FV Prox  Full                                                         +---------+---------------+---------+-----------+----------+--------------+ FV Mid                  Yes      Yes                                 +---------+---------------+---------+-----------+----------+--------------+ FV Distal               Yes      Yes                                 +---------+---------------+---------+-----------+----------+--------------+ PFV      Full                                                        +---------+---------------+---------+-----------+----------+--------------+ POP      Full           Yes      Yes                                 +---------+---------------+---------+-----------+----------+--------------+ PTV      Partial  Acute          +---------+---------------+---------+-----------+----------+--------------+ PERO     Full                                                        +---------+---------------+---------+-----------+----------+--------------+   +---------+---------------+---------+-----------+----------+--------------+ LEFT     CompressibilityPhasicitySpontaneityPropertiesThrombus Aging +---------+---------------+---------+-----------+----------+--------------+ CFV      Full           Yes      Yes                                 +---------+---------------+---------+-----------+----------+--------------+ SFJ      Full                                                        +---------+---------------+---------+-----------+----------+--------------+ FV Prox  Full                                                        +---------+---------------+---------+-----------+----------+--------------+ FV Mid                  Yes      Yes                                 +---------+---------------+---------+-----------+----------+--------------+ FV Distal               Yes      Yes                                  +---------+---------------+---------+-----------+----------+--------------+ PFV      Full                                                        +---------+---------------+---------+-----------+----------+--------------+ POP      Full           Yes      Yes                                 +---------+---------------+---------+-----------+----------+--------------+ PTV      Full                                                        +---------+---------------+---------+-----------+----------+--------------+ PERO     Full                                                        +---------+---------------+---------+-----------+----------+--------------+  Summary: RIGHT: - Findings consistent with acute deep vein thrombosis involving the right posterior tibial veins. - No cystic structure found in the popliteal fossa.  LEFT: - There is no evidence of deep vein thrombosis in the lower extremity. However, portions of this examination were limited- see technologist comments above.  - No cystic structure found in the popliteal fossa.  *See table(s) above for measurements and observations. Electronically signed by Jamelle Haring on 05/27/2022 at 12:13:15 PM.    Final    DG Ankle Complete Right  Result Date: 05/26/2022 CLINICAL DATA:  Pain EXAM: RIGHT ANKLE - COMPLETE 3+ VIEW COMPARISON:  None Available. FINDINGS: No fracture or dislocation is seen. There are no focal lytic lesions. There is soft tissue swelling around the ankle. There are faint calcifications in the soft tissues, possibly vascular. IMPRESSION: No fracture or dislocation is seen in the right ankle. There are no focal lytic lesions. Electronically Signed   By: Elmer Picker M.D.   On: 05/26/2022 15:51   DG Abd Portable 1V  Result Date: 05/25/2022 CLINICAL DATA:  [id number] abdominal distention. History of sepsis and fibromyalgia. EXAM: PORTABLE ABDOMEN - 1 VIEW COMPARISON:  None Available. FINDINGS: Bowel-gas pattern  is unremarkable and nonobstructive. No radio-opaque calculi or other significant radiographic abnormality are seen. Moderate lumbar spondylosis. Intervertebral disc cages at L3-L4 and L4-L5. Severe degenerative changes at the right hip joint with loss of the joint space, subchondral sclerosis and lucencies. IMPRESSION: Bowel-gas pattern is unremarkable. Lumbar spondylosis and severe degenerative changes of the right hip joint. Electronically Signed   By: Frazier Richards M.D.   On: 05/25/2022 14:58   US RENAL  Result Date: 05/22/2022 CLINICAL DATA:  Urinary tract infection. EXAM: RENAL / URINARY TRACT ULTRASOUND COMPLETE COMPARISON:  None Available. FINDINGS: Right Kidney: Renal measurements: 10.1 x 5.0 x 4.3 cm = volume: 116 mL. Echogenicity within normal limits. No mass or hydronephrosis visualized. Left Kidney: Renal measurements: 9.0 x 5.3 x 4.4 cm = volume: 109 mL. Echogenicity within normal limits. 1.6 cm cyst noted in lower pole. No mass or hydronephrosis visualized. Bladder: Mild diffuse bladder wall thickening and trabeculation, suspicious for cystitis. Other: None. IMPRESSION: No evidence of renal mass or hydronephrosis. Mild diffuse bladder wall thickening, suspicious for cystitis. Electronically Signed   By: Marlaine Hind M.D.   On: 05/22/2022 14:58   ECHOCARDIOGRAM COMPLETE  Result Date: 05/22/2022    ECHOCARDIOGRAM REPORT   Patient Name:   CARLIYAH COTTERMAN Date of Exam: 05/22/2022 Medical Rec #:  [id number]          Height:       63.0 in Accession #:    [id number]         Weight:       176.6 lb Date of Birth:  Oct 25, 1932          BSA:          1.834 m Patient Age:    48 years           BP:           150/67 mmHg Patient Gender: F                  HR:           67 bpm. Exam Location:  Inpatient Procedure: 2D Echo, Cardiac Doppler and Color Doppler Indications:    CHF  History:        Patient has prior history of Echocardiogram examinations.  Sonographer:  Jyl Heinz Referring Phys: [id number]  [place]  1. Left ventricular ejection fraction, by estimation, is 60 to 65%. The left ventricle has normal function. The left ventricle has no regional wall motion abnormalities. There is mild left ventricular hypertrophy. Left ventricular diastolic parameters are consistent with Grade I diastolic dysfunction (impaired relaxation).  2. Right ventricular systolic function is low normal. The right ventricular size is normal. There is normal pulmonary artery systolic pressure.  3. The mitral valve is grossly normal. No evidence of mitral valve regurgitation.  4. The aortic valve is tricuspid. Aortic valve regurgitation is not visualized.  5. The inferior vena cava is normal in size with <50% respiratory variability, suggesting right atrial pressure of 8 mmHg. Comparison(s): No significant change from prior study. FINDINGS  Left Ventricle: Left ventricular ejection fraction, by estimation, is 60 to 65%. The left ventricle has normal function. The left ventricle has no regional wall motion abnormalities. The left ventricular internal cavity size was normal in size. There is  mild left ventricular hypertrophy. Left ventricular diastolic parameters are consistent with Grade I diastolic dysfunction (impaired relaxation). Right Ventricle: The right ventricular size is normal. Right ventricular systolic function is low normal. There is normal pulmonary artery systolic pressure. The tricuspid regurgitant velocity is 2.57 m/s, and with an assumed right atrial pressure of 8 mmHg, the estimated right ventricular systolic pressure is 73.4 mmHg. Left Atrium: Left atrial size was normal in size. Right Atrium: Right atrial size was normal in size. Pericardium: There is no evidence of pericardial effusion. Mitral Valve: The mitral valve is grossly normal. No evidence of mitral valve regurgitation. Tricuspid Valve: The tricuspid valve is normal in structure. Tricuspid valve regurgitation is trivial. Aortic Valve:  The aortic valve is tricuspid. Aortic valve regurgitation is not visualized. Aortic valve peak gradient measures 8.6 mmHg. Pulmonic Valve: Pulmonic valve regurgitation is not visualized. Aorta: The aortic root and ascending aorta are structurally normal, with no evidence of dilitation. Venous: The inferior vena cava is normal in size with less than 50% respiratory variability, suggesting right atrial pressure of 8 mmHg. IAS/Shunts: The interatrial septum was not well visualized.  LEFT VENTRICLE PLAX 2D LVIDd:         3.80 cm      Diastology LVIDs:         2.80 cm      LV e' medial:    4.68 cm/s LV PW:         1.00 cm      LV E/e' medial:  13.0 LV IVS:        1.20 cm      LV e' lateral:   6.85 cm/s LVOT diam:     2.00 cm      LV E/e' lateral: 8.9 LV SV:         54 LV SV Index:   29 LVOT Area:     3.14 cm  LV Volumes (MOD) LV vol d, MOD A2C: 105.0 ml LV vol d, MOD A4C: 96.0 ml LV vol s, MOD A2C: 43.3 ml LV vol s, MOD A4C: 45.8 ml LV SV MOD A2C:     61.7 ml LV SV MOD A4C:     96.0 ml LV SV MOD BP:      54.0 ml RIGHT VENTRICLE            IVC RV Basal diam:  3.10 cm    IVC diam: 2.00 cm RV Mid diam:    3.50 cm RV S  prime:     8.27 cm/s TAPSE (M-mode): 1.8 cm LEFT ATRIUM           Index        RIGHT ATRIUM           Index LA diam:      3.00 cm 1.64 cm/m   RA Area:     12.30 cm LA Vol (A2C): 34.4 ml 18.76 ml/m  RA Volume:   28.60 ml  15.59 ml/m LA Vol (A4C): 44.0 ml 23.99 ml/m  AORTIC VALVE AV Area (Vmax): 1.91 cm AV Vmax:        147.00 cm/s AV Peak Grad:   8.6 mmHg LVOT Vmax:      89.40 cm/s LVOT Vmean:     64.400 cm/s LVOT VTI:       0.171 m  AORTA Ao Root diam: 3.10 cm Ao Asc diam:  2.70 cm MITRAL VALVE               TRICUSPID VALVE MV Area (PHT): 3.97 cm    TR Peak grad:   26.4 mmHg MV Decel Time: 191 msec    TR Vmax:        257.00 cm/s MV E velocity: 60.80 cm/s MV A velocity: 72.80 cm/s  SHUNTS MV E/A ratio:  0.84        Systemic VTI:  0.17 m                            Systemic Diam: 2.00 cm Phineas Inches  Electronically signed by Phineas Inches Signature Date/Time: 05/22/2022/12:12:37 PM    Final    CT ABDOMEN PELVIS W CONTRAST  Result Date: 05/21/2022 CLINICAL DATA:  Left lower quadrant abdominal pain with nausea and vomiting. EXAM: CT ABDOMEN AND PELVIS WITH CONTRAST TECHNIQUE: Multidetector CT imaging of the abdomen and pelvis was performed using the standard protocol following bolus administration of intravenous contrast. RADIATION DOSE REDUCTION: This exam was performed according to the departmental dose-optimization program which includes automated exposure control, adjustment of the mA and/or kV according to patient size and/or use of iterative reconstruction technique. CONTRAST:  132m OMNIPAQUE IOHEXOL 300 MG/ML  SOLN COMPARISON:  11/15/2020. FINDINGS: Lower chest: No acute abnormality. Hepatobiliary: No focal liver abnormality is seen. No gallstones, gallbladder wall thickening, or biliary dilatation. Pancreas: Unremarkable. No pancreatic ductal dilatation or surrounding inflammatory changes. Spleen: Normal in size without focal abnormality. Adrenals/Urinary Tract: Normal adrenal glands. Few small low-density renal lesions, largest from the anterior upper pole the left kidney, 1.8 cm, consistent with a cyst. Smaller lesions are too small to characterize but are likely cysts. These findings are stable. No other renal masses, no stones and no hydronephrosis. Normal ureters. Normal bladder. Stomach/Bowel: Stomach is within normal limits. Appendix appears normal. No evidence of bowel wall thickening, distention, or inflammatory changes. Vascular/Lymphatic: Minor aortic atherosclerosis. No aneurysm. No enlarged lymph nodes. Reproductive: Normal postmenopausal uterus. Prominent left ovary versus left ovary cyst, 3.9 cm in greatest dimension, without change from the prior exam allowing for mild differences in measurement technique. A 2.8 cm simple appearing cyst was also noted arising from the left ovary on an  ultrasound dated 04/22/2018. No right adnexal masses. Other: No abdominal wall hernia.  No ascites. Musculoskeletal: No fracture or acute finding. No bone lesion. Prior interbody fusion at L3-L4 and L4-L5. Advanced arthropathic changes of the right hip joint. Findings stable from the prior CT. IMPRESSION: 1. No acute findings within the abdomen  or pelvis. 2. Left ovarian cyst, approximally 3.9 cm, unchanged from the prior CT and likely without change from the pelvic ultrasound dated 04/22/2018. Given the stability, no follow-up is recommended. 3. Mild aortic atherosclerosis. Electronically Signed   By: Lajean Manes M.D.   On: 05/21/2022 14:57   DG Chest Portable 1 View  Result Date: 05/21/2022 CLINICAL DATA:  Rule out pneumonia EXAM: PORTABLE CHEST 1 VIEW COMPARISON:  02/27/2022 FINDINGS: The heart size and mediastinal contours are within normal limits. Both lungs are clear. The visualized skeletal structures are unremarkable. IMPRESSION: No acute abnormality of the lungs in AP portable projection. Electronically Signed   By: Delanna Ahmadi M.D.   On: 05/21/2022 14:03    Microbiology: Results for orders placed or performed during the hospital encounter of 05/21/22  SARS Coronavirus 2 by RT PCR (hospital order, performed in [facility] hospital lab) *cepheid single result test* Anterior Nasal Swab     Status: None   Collection Time: 05/21/22 12:04 PM   Specimen: Anterior Nasal Swab  Result Value Ref Range Status   SARS Coronavirus 2 by RT PCR NEGATIVE NEGATIVE Final    Comment: (NOTE) SARS-CoV-2 target nucleic acids are NOT DETECTED.  The SARS-CoV-2 RNA is generally detectable in upper and lower respiratory specimens during the acute phase of infection. The lowest concentration of SARS-CoV-2 viral copies this assay can detect is 250 copies / mL. A negative result does not preclude SARS-CoV-2 infection and should not be used as the sole basis for treatment or other patient management decisions.  A  negative result may occur with improper specimen collection / handling, submission of specimen other than nasopharyngeal swab, presence of viral mutation(s) within the areas targeted by this assay, and inadequate number of viral copies (<250 copies / mL). A negative result must be combined with clinical observations, patient history, and epidemiological information.  Fact Sheet for Patients:   https://www..info/  Fact Sheet for Healthcare Providers: https://hall.com/  This test is not yet approved or  cleared by the [place] FDA and has been authorized for detection and/or diagnosis of SARS-CoV-2 by FDA under an Emergency Use Authorization (EUA).  This EUA will remain in effect (meaning this test can be used) for the duration of the COVID-19 declaration under Section 564(b)(1) of the Act, 21 U.S.C. section 360bbb-3(b)(1), unless the authorization is terminated or revoked sooner.  Performed at [facility], [place] [address] [place], [place] [address]   Urine Culture     Status: Abnormal   Collection Time: 05/21/22 12:05 PM   Specimen: Urine, Clean Catch  Result Value Ref Range Status   Specimen Description   Final    URINE, CLEAN CATCH Performed at [facility], [place] [address] [place], [place] [address]    Special Requests   Final    NONE Performed at [facility], [place] [address], [place], [place] [address]    Culture >=100,000 COLONIES/mL ESCHERICHIA COLI (A)  Final   Report Status 05/24/2022 FINAL  Final   Organism ID, Bacteria ESCHERICHIA COLI (A)  Final      Susceptibility   Escherichia coli - MIC*    AMPICILLIN <=2 SENSITIVE Sensitive     CEFAZOLIN <=4 SENSITIVE Sensitive     CEFEPIME <=0.12 SENSITIVE Sensitive     CEFTRIAXONE <=0.25 SENSITIVE Sensitive     CIPROFLOXACIN <=0.25 SENSITIVE Sensitive     GENTAMICIN <=1 SENSITIVE Sensitive     IMIPENEM <=0.25  SENSITIVE Sensitive  NITROFURANTOIN <=16 SENSITIVE Sensitive     TRIMETH/SULFA <=20 SENSITIVE Sensitive     AMPICILLIN/SULBACTAM <=2 SENSITIVE Sensitive     PIP/TAZO <=4 SENSITIVE Sensitive     * >=100,000 COLONIES/mL ESCHERICHIA COLI  Blood culture (routine x 2)     Status: Abnormal   Collection Time: 05/21/22 12:15 PM   Specimen: BLOOD RIGHT FOREARM  Result Value Ref Range Status   Specimen Description   Final    BLOOD RIGHT FOREARM Performed at [facility] [address], [place], [place] [address]    Special Requests   Final    BOTTLES DRAWN AEROBIC AND ANAEROBIC Blood Culture results may not be optimal due to an excessive volume of blood received in culture bottles Performed at [facility] [address] [place], [place] [address]    Culture  Setup Time   Final    GRAM NEGATIVE RODS IN BOTH AEROBIC AND ANAEROBIC BOTTLES CRITICAL RESULT CALLED TO, READ BACK BY AND VERIFIED WITH: PHARMD J. Scherrie November [id number] _0  FH    Culture (A)  Final    ESCHERICHIA COLI SUSCEPTIBILITIES PERFORMED ON PREVIOUS CULTURE WITHIN THE LAST 5 DAYS. Performed at [facility], [place] [address], [place], [place] [address]    Report Status 05/24/2022 FINAL  Final  Blood Culture ID Panel (Reflexed)     Status: Abnormal   Collection Time: 05/21/22 12:15 PM  Result Value Ref Range Status   Enterococcus faecalis NOT DETECTED NOT DETECTED Final   Enterococcus Faecium NOT DETECTED NOT DETECTED Final   Listeria monocytogenes NOT DETECTED NOT DETECTED Final   Staphylococcus species NOT DETECTED NOT DETECTED Final   Staphylococcus aureus (BCID) NOT DETECTED NOT DETECTED Final   Staphylococcus epidermidis NOT DETECTED NOT DETECTED Final   Staphylococcus lugdunensis NOT DETECTED NOT DETECTED Final   Streptococcus species NOT DETECTED NOT DETECTED Final   Streptococcus agalactiae NOT DETECTED NOT DETECTED Final   Streptococcus pneumoniae NOT DETECTED NOT DETECTED  Final   Streptococcus pyogenes NOT DETECTED NOT DETECTED Final   A.calcoaceticus-baumannii NOT DETECTED NOT DETECTED Final   Bacteroides fragilis NOT DETECTED NOT DETECTED Final   Enterobacterales DETECTED (A) NOT DETECTED Final    Comment: Enterobacterales represent a large order of gram negative bacteria, not a single organism. CRITICAL RESULT CALLED TO, READ BACK BY AND VERIFIED WITH: PHARMD J. Scherrie November [id number] _1  FH    Enterobacter cloacae complex NOT DETECTED NOT DETECTED Final   Escherichia coli DETECTED (A) NOT DETECTED Final    Comment: CRITICAL RESULT CALLED TO, READ BACK BY AND VERIFIED WITH: PHARMD J. Scherrie November [id number] _2  FH    Klebsiella aerogenes NOT DETECTED NOT DETECTED Final   Klebsiella oxytoca NOT DETECTED NOT DETECTED Final   Klebsiella pneumoniae NOT DETECTED NOT DETECTED Final   Proteus species NOT DETECTED NOT DETECTED Final   Salmonella species NOT DETECTED NOT DETECTED Final   Serratia marcescens NOT DETECTED NOT DETECTED Final   Haemophilus influenzae NOT DETECTED NOT DETECTED Final   Neisseria meningitidis NOT DETECTED NOT DETECTED Final   Pseudomonas aeruginosa NOT DETECTED NOT DETECTED Final   Stenotrophomonas maltophilia NOT DETECTED NOT DETECTED Final   Candida albicans NOT DETECTED NOT DETECTED Final   Candida auris NOT DETECTED NOT DETECTED Final   Candida glabrata NOT DETECTED NOT DETECTED Final   Candida krusei NOT DETECTED NOT DETECTED Final   Candida parapsilosis NOT DETECTED NOT DETECTED Final   Candida tropicalis NOT DETECTED NOT DETECTED Final   Cryptococcus neoformans/gattii NOT DETECTED NOT DETECTED  Final   CTX-M ESBL NOT DETECTED NOT DETECTED Final   Carbapenem resistance IMP NOT DETECTED NOT DETECTED Final   Carbapenem resistance KPC NOT DETECTED NOT DETECTED Final   Carbapenem resistance NDM NOT DETECTED NOT DETECTED Final   Carbapenem resist OXA 48 LIKE NOT DETECTED NOT DETECTED Final   Carbapenem resistance VIM NOT DETECTED NOT DETECTED  Final    Comment: Performed at [facility], [place] [address] [place], [place] [address]  Blood culture (routine x 2)     Status: Abnormal   Collection Time: 05/21/22 12:32 PM   Specimen: BLOOD RIGHT FOREARM  Result Value Ref Range Status   Specimen Description   Final    BLOOD RIGHT FOREARM Performed at [facility] [address], [place], [place] [address]    Special Requests   Final    BOTTLES DRAWN AEROBIC AND ANAEROBIC Blood Culture adequate volume Performed at [facility] [address] [place], [place] [address]    Culture  Setup Time   Final    GRAM NEGATIVE RODS IN BOTH AEROBIC AND ANAEROBIC BOTTLES CRITICAL RESULT CALLED TO, READ BACK BY AND VERIFIED WITH: Damian Leavell [id number] _0  FH Performed at [facility], [place] [address] [place], [place] [address]    Culture ESCHERICHIA COLI (A)  Final   Report Status 05/24/2022 FINAL  Final   Organism ID, Bacteria ESCHERICHIA COLI  Final      Susceptibility   Escherichia coli - MIC*    AMPICILLIN <=2 SENSITIVE Sensitive     CEFAZOLIN <=4 SENSITIVE Sensitive     CEFEPIME <=0.12 SENSITIVE Sensitive     CEFTAZIDIME <=1 SENSITIVE Sensitive     CEFTRIAXONE <=0.25 SENSITIVE Sensitive     CIPROFLOXACIN <=0.25 SENSITIVE Sensitive     GENTAMICIN <=1 SENSITIVE Sensitive     IMIPENEM <=0.25 SENSITIVE Sensitive     TRIMETH/SULFA <=20 SENSITIVE Sensitive     AMPICILLIN/SULBACTAM <=2 SENSITIVE Sensitive     PIP/TAZO <=4 SENSITIVE Sensitive     * ESCHERICHIA COLI  C Difficile Quick Screen w PCR reflex     Status: None   Collection Time: 05/21/22  3:58 PM   Specimen: STOOL  Result Value Ref Range Status   C Diff antigen NEGATIVE NEGATIVE Final   C Diff toxin NEGATIVE NEGATIVE Final   C Diff interpretation No C. difficile detected.  Final    Comment: Performed at [facility], [place] [address], [place],  [address]  Gastrointestinal Panel by PCR , Stool      Status: None   Collection Time: 05/21/22  3:58 PM   Specimen: STOOL  Result Value Ref Range Status   Campylobacter species NOT DETECTED NOT DETECTED Final   Plesimonas shigelloides NOT DETECTED NOT DETECTED Final   Salmonella species NOT DETECTED NOT DETECTED Final   Yersinia enterocolitica NOT DETECTED NOT DETECTED Final   Vibrio species NOT DETECTED NOT DETECTED Final   Vibrio cholerae NOT DETECTED NOT DETECTED Final   Enteroaggregative E coli (EAEC) NOT DETECTED NOT DETECTED Final   Enteropathogenic E coli (EPEC) NOT DETECTED NOT DETECTED Final   Enterotoxigenic E coli (ETEC) NOT DETECTED NOT DETECTED Final   Shiga like toxin producing E coli (STEC) NOT DETECTED NOT DETECTED Final   Shigella/Enteroinvasive E coli (EIEC) NOT DETECTED NOT DETECTED Final   Cryptosporidium NOT DETECTED NOT DETECTED Final   Cyclospora cayetanensis NOT DETECTED NOT DETECTED Final   Entamoeba histolytica NOT DETECTED NOT DETECTED Final  Giardia lamblia NOT DETECTED NOT DETECTED Final   Adenovirus F40/41 NOT DETECTED NOT DETECTED Final   Astrovirus NOT DETECTED NOT DETECTED Final   Norovirus GI/GII NOT DETECTED NOT DETECTED Final   Rotavirus A NOT DETECTED NOT DETECTED Final   Sapovirus (I, II, IV, and V) NOT DETECTED NOT DETECTED Final    Comment: Performed at [facility], [address], [place], [place] [address]  MRSA Next Gen by PCR, Nasal     Status: None   Collection Time: 05/21/22 10:08 PM   Specimen: Nasal Mucosa; Nasal Swab  Result Value Ref Range Status   MRSA by PCR Next Gen NOT DETECTED NOT DETECTED Final    Comment: (NOTE) The GeneXpert MRSA Assay (FDA approved for NASAL specimens only), is one component of a comprehensive MRSA colonization surveillance program. It is not intended to diagnose MRSA infection nor to guide or monitor treatment for MRSA infections. Test performance is not FDA approved in patients less than 48 years old. Performed at [facility],  [place] [address] [place], [place] [address]     Labs: CBC: Recent Labs  Lab 05/25/22 0554 05/26/22 0535 05/27/22 0543 05/28/22 0551 05/29/22 0419 05/30/22 0514  WBC 11.7* 10.4 12.8* 13.4* 14.8* 14.7*  NEUTROABS 8.4* 6.9 8.0*  --   --   --   HGB 12.2 11.4* 11.6* 12.4 11.3* 10.5*  HCT 39.4 36.7 38.0 40.1 35.8* 34.0*  MCV 101.8* 100.8* 102.2* 100.8* 99.4 101.5*  PLT 166 144* 236 341 356 650   Basic Metabolic Panel: Recent Labs  Lab 05/26/22 0535 05/27/22 0543 05/28/22 0551 05/29/22 0419 05/30/22 0514  NA 142 142 140 140 143  K 4.2 3.8 3.3* 4.1 3.9  CL 109 108 99 102 104  CO2 _0 GLUCOSE 87 94 114* 178* 157*  BUN _1 24*  CREATININE 0.71 0.74 0.83 0.85 0.87  CALCIUM 9.1 9.1 9.5 9.5 9.1  MG 2.1 2.1 2.0 2.3 2.2   Liver Function Tests: Recent Labs  Lab 05/24/22 0314 05/25/22 0554 05/26/22 0535 05/27/22 0543  AST 24 18 13* 14*  ALT _2 ALKPHOS 60 62 49 49  BILITOT 0.7 0.6 0.6 0.8  PROT 6.4* 6.7 5.7* 6.1*  ALBUMIN 3.1* 3.2* 2.7* 2.9*   CBG: No results for input(s): "GLUCAP" in the last 168 hours.  Discharge time spent: greater than 30 minutes.  Signed: Berle Mull, MD [facility]

## 2022-05-31 ENCOUNTER — Non-Acute Institutional Stay (SKILLED_NURSING_FACILITY): Payer: Medicare PPO | Admitting: Adult Health

## 2022-05-31 ENCOUNTER — Encounter: Payer: Self-pay | Admitting: Adult Health

## 2022-05-31 ENCOUNTER — Telehealth: Payer: Self-pay

## 2022-05-31 DIAGNOSIS — B9620 Unspecified Escherichia coli [E. coli] as the cause of diseases classified elsewhere: Secondary | ICD-10-CM

## 2022-05-31 DIAGNOSIS — R7881 Bacteremia: Secondary | ICD-10-CM | POA: Diagnosis not present

## 2022-05-31 DIAGNOSIS — R5381 Other malaise: Secondary | ICD-10-CM

## 2022-05-31 DIAGNOSIS — I5032 Chronic diastolic (congestive) heart failure: Secondary | ICD-10-CM | POA: Diagnosis not present

## 2022-05-31 DIAGNOSIS — M47816 Spondylosis without myelopathy or radiculopathy, lumbar region: Secondary | ICD-10-CM

## 2022-05-31 DIAGNOSIS — I824Z1 Acute embolism and thrombosis of unspecified deep veins of right distal lower extremity: Secondary | ICD-10-CM

## 2022-05-31 DIAGNOSIS — M10071 Idiopathic gout, right ankle and foot: Secondary | ICD-10-CM | POA: Diagnosis not present

## 2022-05-31 DIAGNOSIS — M0579 Rheumatoid arthritis with rheumatoid factor of multiple sites without organ or systems involvement: Secondary | ICD-10-CM | POA: Diagnosis not present

## 2022-05-31 DIAGNOSIS — M797 Fibromyalgia: Secondary | ICD-10-CM

## 2022-05-31 NOTE — Telephone Encounter (Signed)
Transition Care Management Unsuccessful Follow-up Telephone Call  Date of discharge and from where:  05/30/2022, [facility]  Attempts:  1st Attempt  Reason for unsuccessful TCM follow-up call:  Unable to be reached ;due to release [facility] skilled nursing facility.

## 2022-05-31 NOTE — Progress Notes (Unsigned)
Location:  [facility] Room Number: N065/A Place of Service:  SNF (31) Provider:  Durenda Age, DNP, FNP-BC  [name], [name], [name]  Extended Emergency Contact Information Primary Emergency Contact: [name] of [place] Phone: [phone] Mobile Phone: [phone] Relation: Daughter  Code Status:  Full code  Goals of care: Advanced Directive information    05/31/2022    3:17 PM  Advanced Directives  Does Patient Have a Medical Advance Directive? Yes  Type of Paramedic of [place];Living will  Does patient want to make changes to medical advance directive? No - Patient declined  Copy of [place] in Chart? Yes - validated most recent copy scanned in chart (See row information)     Chief Complaint  Patient presents with   Hospitalization Follow-up    Hospital follow up     HPI:  Pt is a 88 y.o. female seen today for medical management of chronic diseases.  ***   Past Medical History:  Diagnosis Date   Adrenal failure ([place])    Arthritis    Asthma    Cancer ([place])    Cataract    Closed nondisplaced fracture of fifth right metatarsal bone 09/18/2017   Diverticulitis    Per patient   Fibromyalgia 2008   HCAP (healthcare-associated pneumonia) 02/03/2018   Osteoporosis    RA (rheumatoid arthritis) ([place])    Recurrent upper respiratory infection (URI)    Sepsis due to urinary tract infection ([place]) 01/18/2018   Urticaria    Past Surgical History:  Procedure Laterality Date   BACK SURGERY     BILATERAL CARPAL TUNNEL RELEASE  2005   right and left   CATARACT EXTRACTION, BILATERAL  2004   right and left   CERVICAL FUSION  2011,2010,2008   2 disks   HEEL SPUR SURGERY  2004   lower back fusion  2011   Fusion of 3-4 and 4-5 lower back   RADIOFREQUENCY ABLATION  2020   ROTATOR CUFF REPAIR  [id number]   SQUAMOUS  CELL CARCINOMA EXCISION     TONSILLECTOMY AND ADENOIDECTOMY  1947   TOTAL SHOULDER ARTHROPLASTY      Allergies  Allergen Reactions   Adhesive [Tape] Rash   Celebrex [Celecoxib] Hives   Ciprofibrate Nausea Only   Cymbalta [Duloxetine Hcl] Swelling   Gabitril [Tiagabine] Swelling   Lyrica [Pregabalin] Swelling   Neurontin [Gabapentin] Swelling   Nexium [Esomeprazole] Rash   Nsaids Rash   Shrimp [Shellfish Allergy] Anaphylaxis    Per patient "shrimp only"   Azactam [Aztreonam]     Hand swelling    Azelastine Hcl     Rash    Ciprofloxacin Other (See Comments)    dizziness   Claritin [Loratadine]     Irritability Nervousness    Methotrexate Derivatives    Nasacort [Triamcinolone]     Dizzy    Olopatadine Other (See Comments)    Pain and lethargy    Other    Sulfamethizole Other (See Comments)    unknown   Zantac [Ranitidine Hcl] Other (See Comments)    unknown   Claritin-D 12 Hour [Loratadine-Pseudoephedrine Er] Anxiety   Keflex [Cephalexin] Nausea And Vomiting    Tolerated Ancef   Sulfa Antibiotics Nausea And Vomiting    Outpatient Encounter Medications as of 05/31/2022  Medication Sig   APIXABAN (ELIQUIS) VTE STARTER PACK ('10MG'$  AND '5MG'$ ) Started on 05/26/2022. start with two-'5mg'$  tablets  twice daily for 3 days. On day 8, switch to one-'5mg'$  tablet twice daily.   atorvastatin (LIPITOR) 20 MG tablet TAKE 1 TABLET BY MOUTH EVERY DAY   Biotin 5 MG TBDP Take 5,000 mcg by mouth daily.   cefadroxil (DURICEF) 500 MG capsule Take 1 capsule (500 mg total) by mouth 2 (two) times daily for 4 days.   Cholecalciferol (VITAMIN D3) 5000 units CAPS Take 5,000 Units by mouth daily.   colchicine 0.6 MG tablet Take 1 tablet (0.6 mg total) by mouth daily.   CRANBERRY PO Take 1 capsule by mouth daily.   cycloSPORINE (RESTASIS) 0.05 % ophthalmic emulsion Place 1 drop into both eyes 2 (two) times daily.    diclofenac Sodium (VOLTAREN) 1 % GEL APPLY 2 TO 4 GRAMS TOPICALLY TO AFFECTED JOINTS UP TO 4  TIMES DAILY   feeding supplement (ENSURE ENLIVE / ENSURE PLUS) LIQD Take 237 mLs by mouth 2 (two) times daily between meals.   fexofenadine (ALLEGRA) 180 MG tablet Take 180 mg by mouth daily.   fluconazole (DIFLUCAN) 100 MG tablet Take 1 tablet (100 mg total) by mouth daily for 7 days.   furosemide (LASIX) 80 MG tablet Take 1 tablet by mouth once daily   ketoconazole (NIZORAL) 2 % shampoo Apply 1 application topically once a week.    lansoprazole (PREVACID) 30 MG capsule Take 30 mg by mouth daily at 12 noon.   magic mouthwash SOLN Take 5 mLs by mouth 4 (four) times daily for 14 days.   montelukast (SINGULAIR) 10 MG tablet TAKE 1 TABLET BY MOUTH AT BEDTIME   potassium chloride (MICRO-K) 10 MEQ CR capsule Take 20 mEq by mouth 2 (two) times daily.   predniSONE (DELTASONE) 10 MG tablet Take 30 mg daily for 3 days, Take 20 mg daily for 3 days,Take 10 mg daily   simethicone (MYLICON) 80 MG chewable tablet Chew 1 tablet (80 mg total) by mouth 4 (four) times daily as needed for flatulence.   traMADol (ULTRAM) 50 MG tablet Take 1 tablet (50 mg total) by mouth 4 (four) times daily.   vitamin B-12 (CYANOCOBALAMIN) 1000 MCG tablet Take 1 tablet (1,000 mcg total) by mouth daily.   acetaminophen (TYLENOL) 500 MG tablet Take 500 mg by mouth every 6 (six) hours.    MELATONIN PO Take 10 mg by mouth daily.    Syringe/Needle, Disp, (SYRINGE 3CC/25GX1") 25G X 1" 3 ML MISC 1 application by Does not apply route every 30 (thirty) days.   [DISCONTINUED] fluticasone (FLONASE) 50 MCG/ACT nasal spray SPRAY 2 SPRAYS INTO EACH NOSTRIL EVERY DAY (Patient taking differently: Place 2 sprays into both nostrils daily.)   [DISCONTINUED] lansoprazole (PREVACID) 30 MG capsule TAKE 1 CAPSULE BY MOUTH ONCE DAILY AT NOON (Patient taking differently: Take 30 mg by mouth daily at 12 noon. TAKE 1 CAPSULE BY MOUTH ONCE DAILY AT NOON)   [DISCONTINUED] potassium chloride (KLOR-CON) 10 MEQ tablet TAKE 2  BY MOUTH TWICE DAILY (Patient taking  differently: 20 mEq 2 (two) times daily. TAKE 2  BY MOUTH TWICE DAILY)   [DISCONTINUED] predniSONE (DELTASONE) 10 MG tablet Take 1 tablet (10 mg total) by mouth daily with breakfast. (Patient not taking: Reported on 05/31/2022)   No facility-administered encounter medications on file as of 05/31/2022.    Review of Systems ***    Immunization History  Administered Date(s) Administered   Fluad Quad(high Dose 65+) 09/30/2020   Influenza, High Dose Seasonal PF 08/16/2018, 09/08/2019, 09/24/2020, 10/04/2021   Influenza,inj,Quad PF,6+ Mos 08/11/2016  Influenza-Unspecified 11/12/2017   Moderna Covid-19 Vaccine Bivalent Booster 46yr & up 10/04/2021   Moderna Sars-Covid-2 Vaccination 12/15/2019, 01/12/2020, 10/19/2020   PNEUMOCOCCAL CONJUGATE-20 10/04/2021   PPD Test 03/01/2018   Pneumococcal Conjugate-13 06/26/2017   Pneumococcal Polysaccharide-23 12/11/2009   Zoster Recombinat (Shingrix) 01/28/2019, 09/02/2019   Pertinent  Health Maintenance Due  Topic Date Due   INFLUENZA VACCINE  07/11/2022   DEXA SCAN  Completed      05/28/2022    8:00 AM 05/28/2022    9:16 PM 05/29/2022   10:00 AM 05/29/2022    8:00 PM 05/30/2022    7:30 AM  Fall Risk  Patient Fall Risk Level High fall risk High fall risk High fall risk High fall risk High fall risk     Vitals:   05/31/22 1455  BP: (!) 150/90  Pulse: 72  Resp: 20  Temp: (!) 97.1 F (36.2 C)  SpO2: 97%  Weight: 184 lb 6.4 oz (83.6 kg)  Height: '5\' 3"'$  (1.6 m)   Body mass index is 32.66 kg/m.  Physical Exam     Labs reviewed: Recent Labs    05/28/22 0551 05/29/22 0419 05/30/22 0514  NA 140 140 143  K 3.3* 4.1 3.9  CL 99 102 104  CO2 '30 29 30  '$ GLUCOSE 114* 178* 157*  BUN 9 20 24*  CREATININE 0.83 0.85 0.87  CALCIUM 9.5 9.5 9.1  MG 2.0 2.3 2.2   Recent Labs    05/25/22 0554 05/26/22 0535 05/27/22 0543  AST 18 13* 14*  ALT '16 11 10  '$ ALKPHOS 62 49 49  BILITOT 0.6 0.6 0.8  PROT 6.7 5.7* 6.1*  ALBUMIN 3.2* 2.7* 2.9*    Recent Labs    05/25/22 0554 05/26/22 0535 05/27/22 0543 05/28/22 0551 05/29/22 0419 05/30/22 0514  WBC 11.7* 10.4 12.8* 13.4* 14.8* 14.7*  NEUTROABS 8.4* 6.9 8.0*  --   --   --   HGB 12.2 11.4* 11.6* 12.4 11.3* 10.5*  HCT 39.4 36.7 38.0 40.1 35.8* 34.0*  MCV 101.8* 100.8* 102.2* 100.8* 99.4 101.5*  PLT 166 144* 236 341 356 378   Lab Results  Component Value Date   TSH 0.865 02/27/2022   No results found for: "HGBA1C" Lab Results  Component Value Date   CHOL 176 04/06/2020   HDL 67 04/06/2020   LDLCALC 82 04/06/2020   TRIG 171 (H) 04/06/2020   CHOLHDL 2.6 04/06/2020    Significant Diagnostic Results in last 30 days:  VAS [place]OWER EXTREMITY VENOUS (DVT)  Result Date: 05/27/2022  Lower Venous DVT Study Patient Name:  CLYNNETTA TOM Date of Exam:   05/26/2022 Medical Rec #: [id number]          Accession #:    [id number] of Birth: 9Nov 17, 1933          Patient Gender: F Patient Age:   89years [facility] [place]OWER EXTREMITY VENOUS (DVT) Referring Phys: PRANAV PATEL --------------------------------------------------------------------------------  Indications: Edema.  Risk Factors: None identified. Limitations: Poor ultrasound/tissue interface and patient positioning, patient pain tolerance. Comparison Study: No prior studies. Performing Technologist: GOliver HumRVT  Examination Guidelines: A complete evaluation includes B-mode imaging, spectral Doppler, color Doppler, and power Doppler as needed of all accessible portions of each vessel. Bilateral testing is considered an integral part of a complete examination. Limited examinations for reoccurring indications may be performed as noted. The reflux portion of the exam is performed  with the patient in reverse Trendelenburg.  +---------+---------------+---------+-----------+----------+--------------+ RIGHT    CompressibilityPhasicitySpontaneityPropertiesThrombus Aging  +---------+---------------+---------+-----------+----------+--------------+ CFV      Full           Yes      Yes                                 +---------+---------------+---------+-----------+----------+--------------+ SFJ      Full                                                        +---------+---------------+---------+-----------+----------+--------------+ FV Prox  Full                                                        +---------+---------------+---------+-----------+----------+--------------+ FV Mid                  Yes      Yes                                 +---------+---------------+---------+-----------+----------+--------------+ FV Distal               Yes      Yes                                 +---------+---------------+---------+-----------+----------+--------------+ PFV      Full                                                        +---------+---------------+---------+-----------+----------+--------------+ POP      Full           Yes      Yes                                 +---------+---------------+---------+-----------+----------+--------------+ PTV      Partial                                      Acute          +---------+---------------+---------+-----------+----------+--------------+ PERO     Full                                                        +---------+---------------+---------+-----------+----------+--------------+   +---------+---------------+---------+-----------+----------+--------------+ LEFT     CompressibilityPhasicitySpontaneityPropertiesThrombus Aging +---------+---------------+---------+-----------+----------+--------------+ CFV      Full           Yes      Yes                                 +---------+---------------+---------+-----------+----------+--------------+  SFJ      Full                                                         +---------+---------------+---------+-----------+----------+--------------+ FV Prox  Full                                                        +---------+---------------+---------+-----------+----------+--------------+ FV Mid                  Yes      Yes                                 +---------+---------------+---------+-----------+----------+--------------+ FV Distal               Yes      Yes                                 +---------+---------------+---------+-----------+----------+--------------+ PFV      Full                                                        +---------+---------------+---------+-----------+----------+--------------+ POP      Full           Yes      Yes                                 +---------+---------------+---------+-----------+----------+--------------+ PTV      Full                                                        +---------+---------------+---------+-----------+----------+--------------+ PERO     Full                                                        +---------+---------------+---------+-----------+----------+--------------+     Summary: RIGHT: - Findings consistent with acute deep vein thrombosis involving the right posterior tibial veins. - No cystic structure found in the popliteal fossa.  LEFT: - There is no evidence of deep vein thrombosis in the lower extremity. However, portions of this examination were limited- see technologist comments above.  - No cystic structure found in the popliteal fossa.  *See table(s) above for measurements and observations. Electronically signed by Jamelle Haring on 05/27/2022 at 12:13:15 PM.    Final    DG Ankle Complete Right  Result Date: 05/26/2022 CLINICAL DATA:  Pain EXAM: RIGHT ANKLE - COMPLETE 3+ VIEW COMPARISON:  None Available. FINDINGS: No fracture  or dislocation is seen. There are no focal lytic lesions. There is soft tissue swelling around the ankle. There are faint  calcifications in the soft tissues, possibly vascular. IMPRESSION: No fracture or dislocation is seen in the right ankle. There are no focal lytic lesions. Electronically Signed   By: Elmer Picker M.D.   On: 05/26/2022 15:51   DG Abd Portable 1V  Result Date: 05/25/2022 CLINICAL DATA:  [id number] abdominal distention. History of sepsis and fibromyalgia. EXAM: PORTABLE ABDOMEN - 1 VIEW COMPARISON:  None Available. FINDINGS: Bowel-gas pattern is unremarkable and nonobstructive. No radio-opaque calculi or other significant radiographic abnormality are seen. Moderate lumbar spondylosis. Intervertebral disc cages at L3-L4 and L4-L5. Severe degenerative changes at the right hip joint with loss of the joint space, subchondral sclerosis and lucencies. IMPRESSION: Bowel-gas pattern is unremarkable. Lumbar spondylosis and severe degenerative changes of the right hip joint. Electronically Signed   By: Frazier Richards M.D.   On: 05/25/2022 14:58   US RENAL  Result Date: 05/22/2022 CLINICAL DATA:  Urinary tract infection. EXAM: RENAL / URINARY TRACT ULTRASOUND COMPLETE COMPARISON:  None Available. FINDINGS: Right Kidney: Renal measurements: 10.1 x 5.0 x 4.3 cm = volume: 116 mL. Echogenicity within normal limits. No mass or hydronephrosis visualized. Left Kidney: Renal measurements: 9.0 x 5.3 x 4.4 cm = volume: 109 mL. Echogenicity within normal limits. 1.6 cm cyst noted in lower pole. No mass or hydronephrosis visualized. Bladder: Mild diffuse bladder wall thickening and trabeculation, suspicious for cystitis. Other: None. IMPRESSION: No evidence of renal mass or hydronephrosis. Mild diffuse bladder wall thickening, suspicious for cystitis. Electronically Signed   By: Marlaine Hind M.D.   On: 05/22/2022 14:58   ECHOCARDIOGRAM COMPLETE  Result Date: 05/22/2022    ECHOCARDIOGRAM REPORT   Patient Name:   RONAN DION Date of Exam: 05/22/2022 Medical Rec #:  [id number]          Height:       63.0 in Accession  #:    [id number]         Weight:       176.6 lb Date of Birth:  February 24, 1932          BSA:          1.834 m Patient Age:    7 years           BP:           150/67 mmHg Patient Gender: F                  HR:           67 bpm. Exam Location:  Inpatient Procedure: 2D Echo, Cardiac Doppler and Color Doppler Indications:    CHF  History:        Patient has prior history of Echocardiogram examinations.  Sonographer:    Jyl Heinz Referring Phys: [id number] [place]  1. Left ventricular ejection fraction, by estimation, is 60 to 65%. The left ventricle has normal function. The left ventricle has no regional wall motion abnormalities. There is mild left ventricular hypertrophy. Left ventricular diastolic parameters are consistent with Grade I diastolic dysfunction (impaired relaxation).  2. Right ventricular systolic function is low normal. The right ventricular size is normal. There is normal pulmonary artery systolic pressure.  3. The mitral valve is grossly normal. No evidence of mitral valve regurgitation.  4. The aortic valve is tricuspid. Aortic valve regurgitation is not visualized.  5. The inferior vena cava is normal  in size with <50% respiratory variability, suggesting right atrial pressure of 8 mmHg. Comparison(s): No significant change from prior study. FINDINGS  Left Ventricle: Left ventricular ejection fraction, by estimation, is 60 to 65%. The left ventricle has normal function. The left ventricle has no regional wall motion abnormalities. The left ventricular internal cavity size was normal in size. There is  mild left ventricular hypertrophy. Left ventricular diastolic parameters are consistent with Grade I diastolic dysfunction (impaired relaxation). Right Ventricle: The right ventricular size is normal. Right ventricular systolic function is low normal. There is normal pulmonary artery systolic pressure. The tricuspid regurgitant velocity is 2.57 m/s, and with an assumed right atrial  pressure of 8 mmHg, the estimated right ventricular systolic pressure is 31.5 mmHg. Left Atrium: Left atrial size was normal in size. Right Atrium: Right atrial size was normal in size. Pericardium: There is no evidence of pericardial effusion. Mitral Valve: The mitral valve is grossly normal. No evidence of mitral valve regurgitation. Tricuspid Valve: The tricuspid valve is normal in structure. Tricuspid valve regurgitation is trivial. Aortic Valve: The aortic valve is tricuspid. Aortic valve regurgitation is not visualized. Aortic valve peak gradient measures 8.6 mmHg. Pulmonic Valve: Pulmonic valve regurgitation is not visualized. Aorta: The aortic root and ascending aorta are structurally normal, with no evidence of dilitation. Venous: The inferior vena cava is normal in size with less than 50% respiratory variability, suggesting right atrial pressure of 8 mmHg. IAS/Shunts: The interatrial septum was not well visualized.  LEFT VENTRICLE PLAX 2D LVIDd:         3.80 cm      Diastology LVIDs:         2.80 cm      LV e' medial:    4.68 cm/s LV PW:         1.00 cm      LV E/e' medial:  13.0 LV IVS:        1.20 cm      LV e' lateral:   6.85 cm/s LVOT diam:     2.00 cm      LV E/e' lateral: 8.9 LV SV:         54 LV SV Index:   29 LVOT Area:     3.14 cm  LV Volumes (MOD) LV vol d, MOD A2C: 105.0 ml LV vol d, MOD A4C: 96.0 ml LV vol s, MOD A2C: 43.3 ml LV vol s, MOD A4C: 45.8 ml LV SV MOD A2C:     61.7 ml LV SV MOD A4C:     96.0 ml LV SV MOD BP:      54.0 ml RIGHT VENTRICLE            IVC RV Basal diam:  3.10 cm    IVC diam: 2.00 cm RV Mid diam:    3.50 cm RV S prime:     8.27 cm/s TAPSE (M-mode): 1.8 cm LEFT ATRIUM           Index        RIGHT ATRIUM           Index LA diam:      3.00 cm 1.64 cm/m   RA Area:     12.30 cm LA Vol (A2C): 34.4 ml 18.76 ml/m  RA Volume:   28.60 ml  15.59 ml/m LA Vol (A4C): 44.0 ml 23.99 ml/m  AORTIC VALVE AV Area (Vmax): 1.91 cm AV Vmax:        147.00 cm/s AV Peak Grad:  8.6 mmHg  LVOT Vmax:      89.40 cm/s LVOT Vmean:     64.400 cm/s LVOT VTI:       0.171 m  AORTA Ao Root diam: 3.10 cm Ao Asc diam:  2.70 cm MITRAL VALVE               TRICUSPID VALVE MV Area (PHT): 3.97 cm    TR Peak grad:   26.4 mmHg MV Decel Time: 191 msec    TR Vmax:        257.00 cm/s MV E velocity: 60.80 cm/s MV A velocity: 72.80 cm/s  SHUNTS MV E/A ratio:  0.84        Systemic VTI:  0.17 m                            Systemic Diam: 2.00 cm Phineas Inches Electronically signed by Phineas Inches Signature Date/Time: 05/22/2022/12:12:37 PM    Final    CT ABDOMEN PELVIS W CONTRAST  Result Date: 05/21/2022 CLINICAL DATA:  Left lower quadrant abdominal pain with nausea and vomiting. EXAM: CT ABDOMEN AND PELVIS WITH CONTRAST TECHNIQUE: Multidetector CT imaging of the abdomen and pelvis was performed using the standard protocol following bolus administration of intravenous contrast. RADIATION DOSE REDUCTION: This exam was performed according to the departmental dose-optimization program which includes automated exposure control, adjustment of the mA and/or kV according to patient size and/or use of iterative reconstruction technique. CONTRAST:  170m OMNIPAQUE IOHEXOL 300 MG/ML  SOLN COMPARISON:  11/15/2020. FINDINGS: Lower chest: No acute abnormality. Hepatobiliary: No focal liver abnormality is seen. No gallstones, gallbladder wall thickening, or biliary dilatation. Pancreas: Unremarkable. No pancreatic ductal dilatation or surrounding inflammatory changes. Spleen: Normal in size without focal abnormality. Adrenals/Urinary Tract: Normal adrenal glands. Few small low-density renal lesions, largest from the anterior upper pole the left kidney, 1.8 cm, consistent with a cyst. Smaller lesions are too small to characterize but are likely cysts. These findings are stable. No other renal masses, no stones and no hydronephrosis. Normal ureters. Normal bladder. Stomach/Bowel: Stomach is within normal limits. Appendix appears normal. No  evidence of bowel wall thickening, distention, or inflammatory changes. Vascular/Lymphatic: Minor aortic atherosclerosis. No aneurysm. No enlarged lymph nodes. Reproductive: Normal postmenopausal uterus. Prominent left ovary versus left ovary cyst, 3.9 cm in greatest dimension, without change from the prior exam allowing for mild differences in measurement technique. A 2.8 cm simple appearing cyst was also noted arising from the left ovary on an ultrasound dated 04/22/2018. No right adnexal masses. Other: No abdominal wall hernia.  No ascites. Musculoskeletal: No fracture or acute finding. No bone lesion. Prior interbody fusion at L3-L4 and L4-L5. Advanced arthropathic changes of the right hip joint. Findings stable from the prior CT. IMPRESSION: 1. No acute findings within the abdomen or pelvis. 2. Left ovarian cyst, approximally 3.9 cm, unchanged from the prior CT and likely without change from the pelvic ultrasound dated 04/22/2018. Given the stability, no follow-up is recommended. 3. Mild aortic atherosclerosis. Electronically Signed   By: DLajean ManesM.D.   On: 05/21/2022 14:57   DG Chest Portable 1 View  Result Date: 05/21/2022 CLINICAL DATA:  Rule out pneumonia EXAM: PORTABLE CHEST 1 VIEW COMPARISON:  02/27/2022 FINDINGS: The heart size and mediastinal contours are within normal limits. Both lungs are clear. The visualized skeletal structures are unremarkable. IMPRESSION: No acute abnormality of the lungs in AP portable projection. Electronically Signed   By: ALanae Crumbly  Laqueta Carina M.D.   On: 05/21/2022 14:03    Assessment/Plan ***   Family/ staff Communication: Discussed plan of care with resident and charge nurse  Labs/tests ordered:     Durenda Age, DNP, MSN, FNP-BC [facility] [phone] (Monday-Friday 8:00 a.m. - 5:00 p.m.) [phone] (after hours)

## 2022-06-01 MED ORDER — TRAMADOL HCL 50 MG PO TABS: 50.0000 mg | ORAL_TABLET | Freq: Four times a day (QID) | ORAL | 0 refills | Status: DC

## 2022-06-06 ENCOUNTER — Non-Acute Institutional Stay (SKILLED_NURSING_FACILITY): Payer: Medicare PPO | Admitting: Internal Medicine

## 2022-06-06 ENCOUNTER — Encounter: Payer: Self-pay | Admitting: Internal Medicine

## 2022-06-06 ENCOUNTER — Encounter: Payer: Medicare PPO | Admitting: Family Medicine

## 2022-06-06 DIAGNOSIS — E7800 Pure hypercholesterolemia, unspecified: Secondary | ICD-10-CM | POA: Diagnosis not present

## 2022-06-06 DIAGNOSIS — R7881 Bacteremia: Secondary | ICD-10-CM | POA: Diagnosis not present

## 2022-06-06 DIAGNOSIS — B9620 Unspecified Escherichia coli [E. coli] as the cause of diseases classified elsewhere: Secondary | ICD-10-CM | POA: Diagnosis not present

## 2022-06-06 DIAGNOSIS — I5032 Chronic diastolic (congestive) heart failure: Secondary | ICD-10-CM

## 2022-06-06 DIAGNOSIS — M0579 Rheumatoid arthritis with rheumatoid factor of multiple sites without organ or systems involvement: Secondary | ICD-10-CM

## 2022-06-06 DIAGNOSIS — I824Z1 Acute embolism and thrombosis of unspecified deep veins of right distal lower extremity: Secondary | ICD-10-CM | POA: Diagnosis not present

## 2022-06-06 DIAGNOSIS — M10071 Idiopathic gout, right ankle and foot: Secondary | ICD-10-CM | POA: Diagnosis not present

## 2022-06-06 DIAGNOSIS — R5381 Other malaise: Secondary | ICD-10-CM

## 2022-06-06 NOTE — Progress Notes (Signed)
Provider: Veleta Miners MD Location:   [place] Room Number: 27 Place of Service:  SNF (31)  PCP: Mast, [name], NP [name], [name], [name]  Extended Emergency Contact Information Primary Emergency Contact: [name] Phone: [phone] Mobile Phone: [phone] Relation: Daughter  Code Status: Full Code Managed Care Goals of Care: Advanced Directive information    06/06/2022   11:07 AM  Advanced Directives  Does Patient Have a Medical Advance Directive? Yes  Type of Paramedic of [place];Living will  Does patient want to make changes to medical advance directive? No - Patient declined  Copy of [place] in Chart? Yes - validated most recent copy scanned in chart (See row information)      Chief Complaint  Patient presents with   New Admit To SNF    Admission to SNF    HPI: Patient is a 88 y.o. female seen today for admission to SNF for therapy  Admitted from 06/11-06/20 for E Coli Bacteremia Diarrhea and Acute DVT  Patient has h/o Rheumatoid Arthritis History of allergic rhinitis,  history of adrenal insufficiency On Prednisone  lower extremity edema, osteoporosis, chronic pain,, fibromyalgia H/o Sigmoid Diverticulitis  Went to the hospital for Diarrhea and Abdominal Pain Initially thought to be Diverticulitis But her Blood was positive for E Coli.  She also developed fever and septic shock She was treated with IV antibiotics and diagnosed with UTI treated with IV ceftriaxone and discharged on oral cefadroxil During this time she also reported some ankle pain and her Doppler was positive for DVT patient was started on Eliquis Also was treated for acute gout  Patient is in SNF.  Working with therapy did not have any acute complaints today.  Just feels weak and not ready to go back to her apartment  yet.    Past Medical History:  Diagnosis Date   Adrenal failure ([place])    Arthritis    Asthma    Cancer ([place])    Cataract    Closed nondisplaced fracture of fifth right metatarsal bone 09/18/2017   Diverticulitis    Per patient   Fibromyalgia 2008   HCAP (healthcare-associated pneumonia) 02/03/2018   Osteoporosis    RA (rheumatoid arthritis) ([place])    Recurrent upper respiratory infection (URI)    Sepsis due to urinary tract infection ([place]) 01/18/2018   Urticaria    Past Surgical History:  Procedure Laterality Date   BACK SURGERY     BILATERAL CARPAL TUNNEL RELEASE  2005   right and left   CATARACT EXTRACTION, BILATERAL  2004   right and left   CERVICAL FUSION  2011,2010,2008   2 disks   HEEL SPUR SURGERY  2004   lower back fusion  2011   Fusion of 3-4 and 4-5 lower back   RADIOFREQUENCY ABLATION  2020   ROTATOR CUFF REPAIR  [phone]   SQUAMOUS CELL CARCINOMA EXCISION     TONSILLECTOMY AND ADENOIDECTOMY  1947   TOTAL SHOULDER ARTHROPLASTY      reports that she quit smoking about 55 years ago. Her smoking use included cigarettes. She has a 7.50 pack-year smoking history. She has never used smokeless tobacco. She reports that she does not drink alcohol and does not use drugs. Social History   Socioeconomic History   Marital status: Widowed    Spouse name: Not on file   Number of children: 2  Years of education: Not on file   Highest education level: Not on file  Occupational History   Occupation: Retired Education officer, museum  Tobacco Use   Smoking status: Former    Packs/day: 0.75    Years: 10.00    Total pack years: 7.50    Types: Cigarettes    Quit date: 1968    Years since quitting: 55.5   Smokeless tobacco: Never  Vaping Use   Vaping Use: Never used  Substance and Sexual Activity   Alcohol use: No   Drug use: No   Sexual activity: Not Currently  Other Topics Concern   Not on file  Social History Narrative      Diet:        Do you drink/ eat things with  caffeine? Dr. Malachi Bonds 2/ day      Marital status: Widowed                              What year were you married ? 1953      Do you live in a house, apartment,assistred living, condo, trailer, etc.)? Apartment      Is it one or more stories?       How many persons live in your home ? 1      Do you have any pets in your home ?(please list) No      Highest Level of education completed: PhD       Current or past profession: Transport planner, Education officer, museum, Special Educator       Do you exercise?   No                           Type & how often       ADVANCED DIRECTIVES (Please bring copies)      Do you have a living will? Yes      Do you have a DNR form?                       If not, do you want to discuss one? Yes      Do you have signed POA?HPOA forms?                 If so, please bring to your appointment Tes      FUNCTIONAL STATUS- To be completed by Spouse / child / Staff       Do you have difficulty bathing or dressing yourself ? No      Do you have difficulty preparing food or eating ? No      Do you have difficulty managing your mediation ? No      Do you have difficulty managing your finances ? No      Do you have difficulty affording your medication ? No      Social Determinants of Radio broadcast assistant Strain: Not on file  Food Insecurity: Not on file  Transportation Needs: Not on file  Physical Activity: Not on file  Stress: Not on file  Social Connections: Not on file  Intimate Partner Violence: Not on file    Functional Status Survey:    Family History  Problem Relation Age of Onset   Heart attack Maternal Grandmother    Heart attack Paternal Grandfather    Breast cancer Mother 90   Diabetes Father    Heart disease Father  Congestive Heart Failure Father 81       Died from   Allergic rhinitis Neg Hx    Asthma Neg Hx    Eczema Neg Hx    Urticaria Neg Hx     Health Maintenance  Topic Date Due   TETANUS/TDAP  Never done   INFLUENZA  VACCINE  07/11/2022   Pneumonia Vaccine 71+ Years old  Completed   DEXA SCAN  Completed   COVID-19 Vaccine  Completed   Zoster Vaccines- Shingrix  Completed   HPV VACCINES  Aged Out    Allergies  Allergen Reactions   Adhesive [Tape] Rash   Celebrex [Celecoxib] Hives   Ciprofibrate Nausea Only   Cymbalta [Duloxetine Hcl] Swelling   Gabitril [Tiagabine] Swelling   Lyrica [Pregabalin] Swelling   Neurontin [Gabapentin] Swelling   Nexium [Esomeprazole] Rash   Nsaids Rash   Shrimp [Shellfish Allergy] Anaphylaxis    Per patient "shrimp only"   Azactam [Aztreonam]     Hand swelling    Azelastine Hcl     Rash    Ciprofloxacin Other (See Comments)    dizziness   Claritin [Loratadine]     Irritability Nervousness    Methotrexate Derivatives    Nasacort [Triamcinolone]     Dizzy    Olopatadine Other (See Comments)    Pain and lethargy    Other    Sulfamethizole Other (See Comments)    unknown   Zantac [Ranitidine Hcl] Other (See Comments)    unknown   Claritin-D 12 Hour [Loratadine-Pseudoephedrine Er] Anxiety   Keflex [Cephalexin] Nausea And Vomiting    Tolerated Ancef   Sulfa Antibiotics Nausea And Vomiting    Allergies as of 06/06/2022       Reactions   Adhesive [tape] Rash   Celebrex [celecoxib] Hives   Ciprofibrate Nausea Only   Cymbalta [duloxetine Hcl] Swelling   Gabitril [tiagabine] Swelling   Lyrica [pregabalin] Swelling   Neurontin [gabapentin] Swelling   Nexium [esomeprazole] Rash   Nsaids Rash   Shrimp [shellfish Allergy] Anaphylaxis   Per patient "shrimp only"   Azactam [aztreonam]    Hand swelling    Azelastine Hcl    Rash    Ciprofloxacin Other (See Comments)   dizziness   Claritin [loratadine]    Irritability Nervousness    Methotrexate Derivatives    Nasacort [triamcinolone]    Dizzy    Olopatadine Other (See Comments)   Pain and lethargy    Other    Sulfamethizole Other (See Comments)   unknown   Zantac [ranitidine Hcl] Other (See  Comments)   unknown   Claritin-d 12 Hour [loratadine-pseudoephedrine Er] Anxiety   Keflex [cephalexin] Nausea And Vomiting   Tolerated Ancef   Sulfa Antibiotics Nausea And Vomiting        Medication List        Accurate as of June 06, 2022 11:10 AM. If you have any questions, ask your nurse or doctor.          STOP taking these medications    acetaminophen 500 MG tablet Commonly known as: TYLENOL Stopped by: [name], MD   MELATONIN PO Stopped by: [name], MD       TAKE these medications    Apixaban Starter Pack ('10mg'$  and '5mg'$ ) Commonly known as: ELIQUIS STARTER PACK Started on 05/26/2022. start with two-'5mg'$  tablets twice daily for 3 days. On day 8, switch to one-'5mg'$  tablet twice daily.   atorvastatin 20 MG tablet Commonly known as: LIPITOR TAKE 1  TABLET BY MOUTH EVERY DAY   Biotin 5 MG Tbdp Take 5,000 mcg by mouth daily.   colchicine 0.6 MG tablet Take 1 tablet (0.6 mg total) by mouth daily.   CRANBERRY PO Take 1 capsule by mouth daily.   cycloSPORINE 0.05 % ophthalmic emulsion Commonly known as: RESTASIS Place 1 drop into both eyes 2 (two) times daily.   diclofenac Sodium 1 % Gel Commonly known as: VOLTAREN APPLY 2 TO 4 GRAMS TOPICALLY TO AFFECTED JOINTS UP TO 4 TIMES DAILY   feeding supplement Liqd Take 237 mLs by mouth 2 (two) times daily between meals.   fexofenadine 180 MG tablet Commonly known as: ALLEGRA Take 180 mg by mouth daily.   fluconazole 100 MG tablet Commonly known as: DIFLUCAN Take 1 tablet (100 mg total) by mouth daily for 7 days.   furosemide 80 MG tablet Commonly known as: LASIX Take 1 tablet by mouth once daily   ketoconazole 2 % shampoo Commonly known as: NIZORAL Apply 1 application topically once a week.   lansoprazole 30 MG capsule Commonly known as: PREVACID Take 30 mg by mouth daily at 12 noon.   magic mouthwash Soln Take 5 mLs by mouth 4 (four) times daily for 14 days.   montelukast 10 MG  tablet Commonly known as: SINGULAIR TAKE 1 TABLET BY MOUTH AT BEDTIME   potassium chloride 10 MEQ CR capsule Commonly known as: MICRO-K Take 20 mEq by mouth 2 (two) times daily.   predniSONE 10 MG tablet Commonly known as: DELTASONE Take 10 mg by mouth daily with breakfast. Take 30 mg daily for 3 days, Take 20 mg daily for 3 days, take 10 mg daily   simethicone 80 MG chewable tablet Commonly known as: MYLICON Chew 1 tablet (80 mg total) by mouth 4 (four) times daily as needed for flatulence.   SYRINGE 3CC/25GX1" 25G X 1" 3 ML Misc 1 application by Does not apply route every 30 (thirty) days.   traMADol 50 MG tablet Commonly known as: ULTRAM Take 1 tablet (50 mg total) by mouth 4 (four) times daily.   vitamin B-12 1000 MCG tablet Commonly known as: CYANOCOBALAMIN Take 1 tablet (1,000 mcg total) by mouth daily.   Vitamin D3 125 MCG (5000 UT) Caps Take 5,000 Units by mouth daily.        Review of Systems  Constitutional:  Negative for activity change and appetite change.  HENT: Negative.    Respiratory:  Negative for cough and shortness of breath.   Cardiovascular:  Negative for leg swelling.  Gastrointestinal:  Negative for constipation.  Genitourinary: Negative.   Musculoskeletal:  Positive for gait problem. Negative for arthralgias and myalgias.  Skin: Negative.   Neurological:  Positive for weakness. Negative for dizziness.  Psychiatric/Behavioral:  Positive for dysphoric mood. Negative for confusion and sleep disturbance.     Vitals:   06/06/22 1053  BP: (!) 119/92  Pulse: 74  Resp: 20  Temp: (!) 96.4 F (35.8 C)  SpO2: 92%  Weight: 184 lb 6.4 oz (83.6 kg)  Height: '5\' 2"'$  (1.575 m)   Body mass index is 33.73 kg/m. Physical Exam Vitals reviewed.  Constitutional:      Appearance: Normal appearance.  HENT:     Head: Normocephalic.     Nose: Nose normal.     Mouth/Throat:     Mouth: Mucous membranes are moist.     Pharynx: Oropharynx is clear.  Eyes:      Pupils: Pupils are equal, round, and reactive to  light.  Cardiovascular:     Rate and Rhythm: Normal rate and regular rhythm.     Pulses: Normal pulses.     Heart sounds: Normal heart sounds. No murmur heard. Pulmonary:     Effort: Pulmonary effort is normal.     Breath sounds: Normal breath sounds.  Abdominal:     General: Abdomen is flat. Bowel sounds are normal.     Palpations: Abdomen is soft.  Musculoskeletal:        General: Swelling present.     Cervical back: Neck supple.  Skin:    General: Skin is warm.  Neurological:     General: No focal deficit present.     Mental Status: She is alert and oriented to person, place, and time.  Psychiatric:        Mood and Affect: Mood normal.        Thought Content: Thought content normal.     Labs reviewed: Basic Metabolic Panel: Recent Labs    05/28/22 0551 05/29/22 0419 05/30/22 0514  NA 140 140 143  K 3.3* 4.1 3.9  CL 99 102 104  CO2 '30 29 30  '$ GLUCOSE 114* 178* 157*  BUN 9 20 24*  CREATININE 0.83 0.85 0.87  CALCIUM 9.5 9.5 9.1  MG 2.0 2.3 2.2   Liver Function Tests: Recent Labs    05/25/22 0554 05/26/22 0535 05/27/22 0543  AST 18 13* 14*  ALT '16 11 10  '$ ALKPHOS 62 49 49  BILITOT 0.6 0.6 0.8  PROT 6.7 5.7* 6.1*  ALBUMIN 3.2* 2.7* 2.9*   Recent Labs    05/21/22 1205  LIPASE 22   No results for input(s): "AMMONIA" in the last 8760 hours. CBC: Recent Labs    05/25/22 0554 05/26/22 0535 05/27/22 0543 05/28/22 0551 05/29/22 0419 05/30/22 0514  WBC 11.7* 10.4 12.8* 13.4* 14.8* 14.7*  NEUTROABS 8.4* 6.9 8.0*  --   --   --   HGB 12.2 11.4* 11.6* 12.4 11.3* 10.5*  HCT 39.4 36.7 38.0 40.1 35.8* 34.0*  MCV 101.8* 100.8* 102.2* 100.8* 99.4 101.5*  PLT 166 144* 236 341 356 378   Cardiac Enzymes: No results for input(s): "CKTOTAL", "CKMB", "CKMBINDEX", "TROPONINI" in the last 8760 hours. BNP: Invalid input(s): "POCBNP" No results found for: "HGBA1C" Lab Results  Component Value Date   TSH 0.865  02/27/2022   Lab Results  Component Value Date   VITAMINB12 1,377 (H) 05/26/2022   Lab Results  Component Value Date   FOLATE 26.8 02/06/2018   Lab Results  Component Value Date   IRON 89 02/06/2018   TIBC 281 02/06/2018   FERRITIN 74 02/06/2018    Imaging and Procedures obtained prior to SNF admission: US RENAL  Result Date: 05/22/2022 CLINICAL DATA:  Urinary tract infection. EXAM: RENAL / URINARY TRACT ULTRASOUND COMPLETE COMPARISON:  None Available. FINDINGS: Right Kidney: Renal measurements: 10.1 x 5.0 x 4.3 cm = volume: 116 mL. Echogenicity within normal limits. No mass or hydronephrosis visualized. Left Kidney: Renal measurements: 9.0 x 5.3 x 4.4 cm = volume: 109 mL. Echogenicity within normal limits. 1.6 cm cyst noted in lower pole. No mass or hydronephrosis visualized. Bladder: Mild diffuse bladder wall thickening and trabeculation, suspicious for cystitis. Other: None. IMPRESSION: No evidence of renal mass or hydronephrosis. Mild diffuse bladder wall thickening, suspicious for cystitis. Electronically Signed   By: Marlaine Hind M.D.   On: 05/22/2022 14:58   ECHOCARDIOGRAM COMPLETE  Result Date: 05/22/2022    ECHOCARDIOGRAM REPORT   Patient Name:  Eileen Wilkerson Date of Exam: 05/22/2022 Medical Rec #:  [id number]          Height:       63.0 in Accession #:    [id number]         Weight:       176.6 lb Date of Birth:  08/24/1932          BSA:          1.834 m Patient Age:    38 years           BP:           150/67 mmHg Patient Gender: F                  HR:           67 bpm. Exam Location:  Inpatient Procedure: 2D Echo, Cardiac Doppler and Color Doppler Indications:    CHF  History:        Patient has prior history of Echocardiogram examinations.  Sonographer:    Jyl Heinz Referring Phys: [id number] [place]  1. Left ventricular ejection fraction, by estimation, is 60 to 65%. The left ventricle has normal function. The left ventricle has no regional wall motion  abnormalities. There is mild left ventricular hypertrophy. Left ventricular diastolic parameters are consistent with Grade I diastolic dysfunction (impaired relaxation).  2. Right ventricular systolic function is low normal. The right ventricular size is normal. There is normal pulmonary artery systolic pressure.  3. The mitral valve is grossly normal. No evidence of mitral valve regurgitation.  4. The aortic valve is tricuspid. Aortic valve regurgitation is not visualized.  5. The inferior vena cava is normal in size with <50% respiratory variability, suggesting right atrial pressure of 8 mmHg. Comparison(s): No significant change from prior study. FINDINGS  Left Ventricle: Left ventricular ejection fraction, by estimation, is 60 to 65%. The left ventricle has normal function. The left ventricle has no regional wall motion abnormalities. The left ventricular internal cavity size was normal in size. There is  mild left ventricular hypertrophy. Left ventricular diastolic parameters are consistent with Grade I diastolic dysfunction (impaired relaxation). Right Ventricle: The right ventricular size is normal. Right ventricular systolic function is low normal. There is normal pulmonary artery systolic pressure. The tricuspid regurgitant velocity is 2.57 m/s, and with an assumed right atrial pressure of 8 mmHg, the estimated right ventricular systolic pressure is 51.0 mmHg. Left Atrium: Left atrial size was normal in size. Right Atrium: Right atrial size was normal in size. Pericardium: There is no evidence of pericardial effusion. Mitral Valve: The mitral valve is grossly normal. No evidence of mitral valve regurgitation. Tricuspid Valve: The tricuspid valve is normal in structure. Tricuspid valve regurgitation is trivial. Aortic Valve: The aortic valve is tricuspid. Aortic valve regurgitation is not visualized. Aortic valve peak gradient measures 8.6 mmHg. Pulmonic Valve: Pulmonic valve regurgitation is not visualized.  Aorta: The aortic root and ascending aorta are structurally normal, with no evidence of dilitation. Venous: The inferior vena cava is normal in size with less than 50% respiratory variability, suggesting right atrial pressure of 8 mmHg. IAS/Shunts: The interatrial septum was not well visualized.  LEFT VENTRICLE PLAX 2D LVIDd:         3.80 cm      Diastology LVIDs:         2.80 cm      LV e' medial:    4.68 cm/s LV PW:  1.00 cm      LV E/e' medial:  13.0 LV IVS:        1.20 cm      LV e' lateral:   6.85 cm/s LVOT diam:     2.00 cm      LV E/e' lateral: 8.9 LV SV:         54 LV SV Index:   29 LVOT Area:     3.14 cm  LV Volumes (MOD) LV vol d, MOD A2C: 105.0 ml LV vol d, MOD A4C: 96.0 ml LV vol s, MOD A2C: 43.3 ml LV vol s, MOD A4C: 45.8 ml LV SV MOD A2C:     61.7 ml LV SV MOD A4C:     96.0 ml LV SV MOD BP:      54.0 ml RIGHT VENTRICLE            IVC RV Basal diam:  3.10 cm    IVC diam: 2.00 cm RV Mid diam:    3.50 cm RV S prime:     8.27 cm/s TAPSE (M-mode): 1.8 cm LEFT ATRIUM           Index        RIGHT ATRIUM           Index LA diam:      3.00 cm 1.64 cm/m   RA Area:     12.30 cm LA Vol (A2C): 34.4 ml 18.76 ml/m  RA Volume:   28.60 ml  15.59 ml/m LA Vol (A4C): 44.0 ml 23.99 ml/m  AORTIC VALVE AV Area (Vmax): 1.91 cm AV Vmax:        147.00 cm/s AV Peak Grad:   8.6 mmHg LVOT Vmax:      89.40 cm/s LVOT Vmean:     64.400 cm/s LVOT VTI:       0.171 m  AORTA Ao Root diam: 3.10 cm Ao Asc diam:  2.70 cm MITRAL VALVE               TRICUSPID VALVE MV Area (PHT): 3.97 cm    TR Peak grad:   26.4 mmHg MV Decel Time: 191 msec    TR Vmax:        257.00 cm/s MV E velocity: 60.80 cm/s MV A velocity: 72.80 cm/s  SHUNTS MV E/A ratio:  0.84        Systemic VTI:  0.17 m                            Systemic Diam: 2.00 cm Phineas Inches Electronically signed by Phineas Inches Signature Date/Time: 05/22/2022/12:12:37 PM    Final    CT ABDOMEN PELVIS W CONTRAST  Result Date: 05/21/2022 CLINICAL DATA:  Left lower quadrant  abdominal pain with nausea and vomiting. EXAM: CT ABDOMEN AND PELVIS WITH CONTRAST TECHNIQUE: Multidetector CT imaging of the abdomen and pelvis was performed using the standard protocol following bolus administration of intravenous contrast. RADIATION DOSE REDUCTION: This exam was performed according to the departmental dose-optimization program which includes automated exposure control, adjustment of the mA and/or kV according to patient size and/or use of iterative reconstruction technique. CONTRAST:  198m OMNIPAQUE IOHEXOL 300 MG/ML  SOLN COMPARISON:  11/15/2020. FINDINGS: Lower chest: No acute abnormality. Hepatobiliary: No focal liver abnormality is seen. No gallstones, gallbladder wall thickening, or biliary dilatation. Pancreas: Unremarkable. No pancreatic ductal dilatation or surrounding inflammatory changes. Spleen: Normal in size without focal abnormality. Adrenals/Urinary Tract: Normal adrenal  glands. Few small low-density renal lesions, largest from the anterior upper pole the left kidney, 1.8 cm, consistent with a cyst. Smaller lesions are too small to characterize but are likely cysts. These findings are stable. No other renal masses, no stones and no hydronephrosis. Normal ureters. Normal bladder. Stomach/Bowel: Stomach is within normal limits. Appendix appears normal. No evidence of bowel wall thickening, distention, or inflammatory changes. Vascular/Lymphatic: Minor aortic atherosclerosis. No aneurysm. No enlarged lymph nodes. Reproductive: Normal postmenopausal uterus. Prominent left ovary versus left ovary cyst, 3.9 cm in greatest dimension, without change from the prior exam allowing for mild differences in measurement technique. A 2.8 cm simple appearing cyst was also noted arising from the left ovary on an ultrasound dated 04/22/2018. No right adnexal masses. Other: No abdominal wall hernia.  No ascites. Musculoskeletal: No fracture or acute finding. No bone lesion. Prior interbody fusion at  L3-L4 and L4-L5. Advanced arthropathic changes of the right hip joint. Findings stable from the prior CT. IMPRESSION: 1. No acute findings within the abdomen or pelvis. 2. Left ovarian cyst, approximally 3.9 cm, unchanged from the prior CT and likely without change from the pelvic ultrasound dated 04/22/2018. Given the stability, no follow-up is recommended. 3. Mild aortic atherosclerosis. Electronically Signed   By: Lajean Manes M.D.   On: 05/21/2022 14:57   DG Chest Portable 1 View  Result Date: 05/21/2022 CLINICAL DATA:  Rule out pneumonia EXAM: PORTABLE CHEST 1 VIEW COMPARISON:  02/27/2022 FINDINGS: The heart size and mediastinal contours are within normal limits. Both lungs are clear. The visualized skeletal structures are unremarkable. IMPRESSION: No acute abnormality of the lungs in AP portable projection. Electronically Signed   By: Delanna Ahmadi M.D.   On: 05/21/2022 14:03    Assessment/Plan 1. E coli bacteremia Finished her Antibiotic  Doing well Working with therapy 2. Acute deep vein thrombosis (DVT) of distal end of right lower extremity ([place]) On Eliquis now  3. Acute idiopathic gout of right ankle DO not see any Uric acid levels ? Diagnosis Will check levels and possible use Colchicine pRN  4. Rheumatoid arthritis involving multiple sites with positive rheumatoid factor (HCC) On Chronic Prednisone Not seen Rheumatology Recently On Chronic Prednisone Has failed taper  Also failed other Meds  5. Chronic diastolic heart failure (HCC) On High dose of lasix EF 60% last Echo in the hospital  6. Physical deconditioning Working with Therapy  7. Pure hypercholesterolemia On statin    Family/ staff Communication:   Labs/tests ordered:

## 2022-06-14 ENCOUNTER — Encounter: Payer: Medicare PPO | Admitting: Family Medicine

## 2022-06-19 ENCOUNTER — Non-Acute Institutional Stay (SKILLED_NURSING_FACILITY): Payer: Medicare PPO | Admitting: Adult Health

## 2022-06-19 DIAGNOSIS — E876 Hypokalemia: Secondary | ICD-10-CM

## 2022-06-19 DIAGNOSIS — M10071 Idiopathic gout, right ankle and foot: Secondary | ICD-10-CM

## 2022-06-19 DIAGNOSIS — I824Z1 Acute embolism and thrombosis of unspecified deep veins of right distal lower extremity: Secondary | ICD-10-CM

## 2022-06-19 DIAGNOSIS — E7800 Pure hypercholesterolemia, unspecified: Secondary | ICD-10-CM | POA: Diagnosis not present

## 2022-06-19 DIAGNOSIS — R7881 Bacteremia: Secondary | ICD-10-CM | POA: Diagnosis not present

## 2022-06-19 DIAGNOSIS — B9620 Unspecified Escherichia coli [E. coli] as the cause of diseases classified elsewhere: Secondary | ICD-10-CM | POA: Diagnosis not present

## 2022-06-19 DIAGNOSIS — J45909 Unspecified asthma, uncomplicated: Secondary | ICD-10-CM | POA: Diagnosis not present

## 2022-06-19 DIAGNOSIS — I5032 Chronic diastolic (congestive) heart failure: Secondary | ICD-10-CM

## 2022-06-19 DIAGNOSIS — M0579 Rheumatoid arthritis with rheumatoid factor of multiple sites without organ or systems involvement: Secondary | ICD-10-CM | POA: Diagnosis not present

## 2022-06-20 ENCOUNTER — Encounter: Payer: Self-pay | Admitting: Adult Health

## 2022-06-20 ENCOUNTER — Telehealth: Payer: Self-pay | Admitting: *Deleted

## 2022-06-20 ENCOUNTER — Other Ambulatory Visit: Payer: Self-pay | Admitting: Nurse Practitioner

## 2022-06-20 DIAGNOSIS — E7800 Pure hypercholesterolemia, unspecified: Secondary | ICD-10-CM

## 2022-06-20 MED ORDER — CYCLOSPORINE 0.05 % OP EMUL: 1.0000 [drp] | Freq: Two times a day (BID) | OPHTHALMIC | 1 refills | Status: AC

## 2022-06-20 MED ORDER — MONTELUKAST SODIUM 10 MG PO TABS: 10.0000 mg | ORAL_TABLET | Freq: Every day | ORAL | 0 refills | Status: DC

## 2022-06-20 MED ORDER — COLCHICINE 0.6 MG PO TABS: 0.6000 mg | ORAL_TABLET | Freq: Every day | ORAL | 0 refills | Status: DC

## 2022-06-20 MED ORDER — BIOTIN 5 MG PO TBDP: 5.0000 mg | ORAL_TABLET | Freq: Every day | ORAL | 0 refills | Status: AC

## 2022-06-20 MED ORDER — PREDNISONE 10 MG PO TABS: 10.0000 mg | ORAL_TABLET | Freq: Every day | ORAL | 0 refills | Status: DC

## 2022-06-20 MED ORDER — ATORVASTATIN CALCIUM 20 MG PO TABS: 20.0000 mg | ORAL_TABLET | Freq: Every day | ORAL | 0 refills | Status: DC

## 2022-06-20 MED ORDER — SIMETHICONE 80 MG PO CHEW: 80.0000 mg | CHEWABLE_TABLET | Freq: Four times a day (QID) | ORAL | 0 refills | Status: DC | PRN

## 2022-06-20 MED ORDER — VITAMIN B-12 1000 MCG PO TABS
1000.0000 ug | ORAL_TABLET | Freq: Every day | ORAL | 0 refills | Status: DC
Start: 2022-06-20 — End: 2023-02-12

## 2022-06-20 MED ORDER — FUROSEMIDE 80 MG PO TABS: 80.0000 mg | ORAL_TABLET | Freq: Every day | ORAL | 2 refills | Status: DC

## 2022-06-20 MED ORDER — KETOCONAZOLE 2 % EX SHAM: 1.0000 | MEDICATED_SHAMPOO | CUTANEOUS | 0 refills | Status: DC

## 2022-06-20 MED ORDER — APIXABAN 5 MG PO TABS
5.0000 mg | ORAL_TABLET | Freq: Two times a day (BID) | ORAL | 0 refills | Status: DC
Start: 2022-06-20 — End: 2022-11-01

## 2022-06-20 MED ORDER — VITAMIN D3 125 MCG (5000 UT) PO CAPS: 5000.0000 [IU] | ORAL_CAPSULE | Freq: Every day | ORAL | 0 refills | Status: DC

## 2022-06-20 MED ORDER — FEXOFENADINE HCL 180 MG PO TABS: 180.0000 mg | ORAL_TABLET | Freq: Every day | ORAL | 0 refills | Status: DC

## 2022-06-20 MED ORDER — DICLOFENAC SODIUM 1 % EX GEL: CUTANEOUS | 0 refills | Status: AC

## 2022-06-20 MED ORDER — APIXABAN 5 MG PO TABS: 5.0000 mg | ORAL_TABLET | Freq: Two times a day (BID) | ORAL | 0 refills | Status: DC

## 2022-06-20 MED ORDER — POTASSIUM CHLORIDE ER 10 MEQ PO CPCR: 20.0000 meq | ORAL_CAPSULE | Freq: Two times a day (BID) | ORAL | 0 refills | Status: DC

## 2022-06-20 MED ORDER — TRAMADOL HCL 50 MG PO TABS: 50.0000 mg | ORAL_TABLET | Freq: Four times a day (QID) | ORAL | 0 refills | Status: DC

## 2022-06-20 NOTE — Telephone Encounter (Signed)
Eileen Wilkerson, Nurse with [facility], did her Hospital Discharge Visit with patient over the phone yesterday and patient stated that she went into the hospital with a UTI but they found a DVT Right leg.   Stated that the hospital was suppose to call in a Rx for Eliquis but when the patient when to pick it up it was NOT there.   Nurse is wanting [place] to call in this Rx for the patient so she can get started on it.   Medication is in patient's current medication list.  Pended Rx and sent to [facility] for approval.

## 2022-06-20 NOTE — Telephone Encounter (Signed)
Medina-Vargas, Monina C, NP  [facility] Clinical Pool 8 minutes ago (12:18 PM)   She went home with her medications from SNF. I just sent her prescriptions to pharmacy as well.

## 2022-06-20 NOTE — Progress Notes (Signed)
Location:  [facility] Room Number: 76 Place of Service:  SNF (31) Provider:  Durenda Age, DNP, FNP-BC  [name], [name], [name]  Extended Emergency Contact Information Primary Emergency Contact: [name] of [place] Phone: [phone] Mobile Phone: [phone] Relation: Daughter  Code Status:  FULL CODE  Goals of care: Advanced Directive information    06/20/2022    9:26 AM  Advanced Directives  Does Patient Have a Medical Advance Directive? Yes  Type of Paramedic of [place];Living will  Does patient want to make changes to medical advance directive? No - Patient declined  Copy of [place] in Chart? Yes - validated most recent copy scanned in chart (See row information)     Chief Complaint  Patient presents with   Discharge Note    Discharge to independent living apartments today.    HPI:  Pt is a 88 y.o. female seen today for discharge to [facility] apartment today, 06/19/22, with PT, OT and ST.   She was admitted to [facility] on 05/30/22 post hospital admission 05/21/2022 to 05/30/2022.  She has a PMH of asthma, fibromyalgia and rheumatoid arthritis on chronic steroids.  She presented to the hospital with severe diarrhea, fever, tachycardia and tachypnea, meeting SIRS criteria.  Due to persistent hypotension on 6/12, she was transferred to ICU and was given peripheral pressors with stress dose of steroids.  BP improved and pressors were discontinued.  Blood and urine cultures grew E. coli.  She was initially started on IV cefepime and Flagyl then changed to ceftriaxone and Flagyl.  She was discharged on cefadroxil for a total of 5 more days.  She complained of ankle pain and Doppler was positive for DVT.  Eliquis was a started on 05/26/2022.  Due to right ankle swelling while  on antibiotic for infection, she was a started with colchicine therapy with higher dose of prednisone taper for short duration.  She completed her antibiotic course and prednisone taper at [facility].  Patient was admitted to this facility for short-term rehabilitation after the patient's recent hospitalization.  Patient has completed SNF rehabilitation and therapy has cleared the patient for discharge.   Past Medical History:  Diagnosis Date   Adrenal failure ([place])    Arthritis    Asthma    Cancer ([place])    Cataract    Closed nondisplaced fracture of fifth right metatarsal bone 09/18/2017   Diverticulitis    Per patient   Fibromyalgia 2008   HCAP (healthcare-associated pneumonia) 02/03/2018   Osteoporosis    RA (rheumatoid arthritis) ([place])    Recurrent upper respiratory infection (URI)    Sepsis due to urinary tract infection ([place]) 01/18/2018   Urticaria    Past Surgical History:  Procedure Laterality Date   BACK SURGERY     BILATERAL CARPAL TUNNEL RELEASE  2005   right and left   CATARACT EXTRACTION, BILATERAL  2004   right and left   CERVICAL FUSION  2011,2010,2008   2 disks   HEEL SPUR SURGERY  2004   lower back fusion  2011   Fusion of 3-4 and 4-5 lower back   RADIOFREQUENCY ABLATION  2020   ROTATOR CUFF REPAIR  [id number]   SQUAMOUS CELL CARCINOMA EXCISION     TONSILLECTOMY AND ADENOIDECTOMY  1947   TOTAL SHOULDER ARTHROPLASTY      Allergies  Allergen Reactions   Adhesive [Tape] Rash   Celebrex [Celecoxib] Hives   Ciprofibrate Nausea Only   Cymbalta [Duloxetine Hcl] Swelling   Gabitril [Tiagabine] Swelling   Lyrica [Pregabalin] Swelling   Neurontin [Gabapentin] Swelling   Nexium [Esomeprazole] Rash   Nsaids Rash   Shrimp [Shellfish Allergy] Anaphylaxis    Per patient "shrimp only"   Azactam [Aztreonam]     Hand swelling    Azelastine Hcl     Rash    Ciprofloxacin Other (See Comments)    dizziness   Claritin [Loratadine]     Irritability  Nervousness    Methotrexate Derivatives    Nasacort [Triamcinolone]     Dizzy    Olopatadine Other (See Comments)    Pain and lethargy    Other    Sulfamethizole Other (See Comments)    unknown   Zantac [Ranitidine Hcl] Other (See Comments)    unknown   Claritin-D 12 Hour [Loratadine-Pseudoephedrine Er] Anxiety   Keflex [Cephalexin] Nausea And Vomiting    Tolerated Ancef   Penicillins Rash    Injection site reaction. Tolerated cefepime in past. Also reports tolerating a penicillin infusion after this initial rxn ~20 yrs ago.  Has patient had a PCN reaction causing immediate rash, facial/tongue/throat swelling, SOB or lightheadedness with hypotension: No Has patient had a PCN reaction causing severe rash involving mucus membranes or skin necrosis: No Has patient had a PCN reaction that required hospitalization No Has patient had a PCN reaction occurring within the last 10 years: No     Sulfa Antibiotics Nausea And Vomiting    Outpatient Encounter Medications as of 06/19/2022  Medication Sig   apixaban (ELIQUIS) 5 MG TABS tablet Take 5 mg by mouth 2 (two) times daily.   atorvastatin (LIPITOR) 20 MG tablet TAKE 1 TABLET BY MOUTH EVERY DAY   Biotin 5 MG TBDP Take 5,000 mcg by mouth daily.   Cholecalciferol (VITAMIN D3) 5000 units CAPS Take 5,000 Units by mouth daily.   colchicine 0.6 MG tablet Take 1 tablet (0.6 mg total) by mouth daily.   CRANBERRY PO Take 1 capsule by mouth daily.   cycloSPORINE (RESTASIS) 0.05 % ophthalmic emulsion Place 1 drop into both eyes 2 (two) times daily.    diclofenac Sodium (VOLTAREN) 1 % GEL APPLY 2 TO 4 GRAMS TOPICALLY TO AFFECTED JOINTS UP TO 4 TIMES DAILY   fexofenadine (ALLEGRA) 180 MG tablet Take 180 mg by mouth daily.   furosemide (LASIX) 80 MG tablet Take 1 tablet by mouth once daily   ketoconazole (NIZORAL) 2 % shampoo Apply 1 application topically once a week.    lansoprazole (PREVACID) 30 MG capsule Take 30 mg by mouth daily at 12 noon.    montelukast (SINGULAIR) 10 MG tablet TAKE 1 TABLET BY MOUTH AT BEDTIME   potassium chloride (MICRO-K) 10 MEQ CR capsule Take 20 mEq by mouth 2 (two) times daily.   predniSONE (DELTASONE) 10 MG tablet Take 10 mg by mouth daily with breakfast. Take 30 mg daily for 3 days, Take 20 mg daily for 3 days, take 10 mg daily   simethicone (MYLICON) 80 MG chewable tablet Chew 1 tablet (80 mg total) by mouth 4 (four) times daily as needed for flatulence.   traMADol (ULTRAM) 50 MG tablet Take 1 tablet (50 mg total) by mouth 4 (four) times daily.   vitamin B-12 (CYANOCOBALAMIN) 1000 MCG tablet Take 1 tablet (1,000 mcg total) by mouth daily.   [DISCONTINUED] APIXABAN (ELIQUIS) VTE STARTER PACK ('10MG'$  AND '5MG'$ )  Started on 05/26/2022. start with two-'5mg'$  tablets twice daily for 3 days. On day 8, switch to one-'5mg'$  tablet twice daily.   [DISCONTINUED] feeding supplement (ENSURE ENLIVE / ENSURE PLUS) LIQD Take 237 mLs by mouth 2 (two) times daily between meals.   [DISCONTINUED] Syringe/Needle, Disp, (SYRINGE 3CC/25GX1") 25G X 1" 3 ML MISC 1 application by Does not apply route every 30 (thirty) days.   No facility-administered encounter medications on file as of 06/19/2022.    Review of Systems  Constitutional:  Negative for appetite change, chills, fatigue and fever.  HENT:  Negative for congestion, hearing loss, rhinorrhea and sore throat.   Eyes: Negative.   Respiratory:  Negative for cough, shortness of breath and wheezing.   Cardiovascular:  Negative for chest pain, palpitations and leg swelling.  Gastrointestinal:  Negative for abdominal pain, constipation, diarrhea, nausea and vomiting.  Genitourinary:  Negative for dysuria.  Musculoskeletal:  Negative for arthralgias, back pain and myalgias.  Skin:  Negative for color change, rash and wound.  Neurological:  Negative for dizziness, weakness and headaches.  Psychiatric/Behavioral:  Negative for behavioral problems. The patient is not nervous/anxious.        Immunization History  Administered Date(s) Administered   Fluad Quad(high Dose 65+) 09/30/2020   Influenza, High Dose Seasonal PF 08/16/2018, 09/08/2019, 09/24/2020, 10/04/2021   Influenza,inj,Quad PF,6+ Mos 08/11/2016   Influenza-Unspecified 11/12/2017   Moderna Covid-19 Vaccine Bivalent Booster 18yr & up 10/04/2021   Moderna Sars-Covid-2 Vaccination 12/15/2019, 01/12/2020, 10/19/2020   PNEUMOCOCCAL CONJUGATE-20 10/04/2021   PPD Test 03/01/2018   Pneumococcal Conjugate-13 06/26/2017   Pneumococcal Polysaccharide-23 12/11/2009   Zoster Recombinat (Shingrix) 01/28/2019, 09/02/2019   Pertinent  Health Maintenance Due  Topic Date Due   INFLUENZA VACCINE  07/11/2022   DEXA SCAN  Completed      05/28/2022    8:00 AM 05/28/2022    9:16 PM 05/29/2022   10:00 AM 05/29/2022    8:00 PM 05/30/2022    7:30 AM  Fall Risk  Patient Fall Risk Level High fall risk High fall risk High fall risk High fall risk High fall risk     Vitals:   06/20/22 0917  BP: 115/86  Pulse: 100  Resp: 16  Temp: (!) 97 F (36.1 C)  SpO2: 97%  Weight: 179 lb 4.8 oz (81.3 kg)  Height: '5\' 2"'$  (1.575 m)   Body mass index is 32.79 kg/m.  Physical Exam Constitutional:      General: She is not in acute distress.    Appearance: She is obese.  HENT:     Head: Normocephalic and atraumatic.     Nose: Nose normal.     Mouth/Throat:     Mouth: Mucous membranes are moist.  Eyes:     Conjunctiva/sclera: Conjunctivae normal.  Cardiovascular:     Rate and Rhythm: Normal rate and regular rhythm.  Pulmonary:     Effort: Pulmonary effort is normal.     Breath sounds: Normal breath sounds.  Abdominal:     General: Bowel sounds are normal.     Palpations: Abdomen is soft.  Musculoskeletal:        General: Swelling present. Normal range of motion.     Cervical back: Normal range of motion.     Comments: BLE trace edema  Skin:    General: Skin is warm and dry.  Neurological:     General: No focal deficit  present.     Mental Status: She is alert and oriented to person, place, and time.  Psychiatric:        Mood and Affect: Mood normal.        Behavior: Behavior normal.        Thought Content: Thought content normal.        Judgment: Judgment normal.        Labs reviewed: Recent Labs    05/28/22 0551 05/29/22 0419 05/30/22 0514  NA 140 140 143  K 3.3* 4.1 3.9  CL 99 102 104  CO2 '30 29 30  '$ GLUCOSE 114* 178* 157*  BUN 9 20 24*  CREATININE 0.83 0.85 0.87  CALCIUM 9.5 9.5 9.1  MG 2.0 2.3 2.2   Recent Labs    05/25/22 0554 05/26/22 0535 05/27/22 0543  AST 18 13* 14*  ALT '16 11 10  '$ ALKPHOS 62 49 49  BILITOT 0.6 0.6 0.8  PROT 6.7 5.7* 6.1*  ALBUMIN 3.2* 2.7* 2.9*   Recent Labs    05/25/22 0554 05/26/22 0535 05/27/22 0543 05/28/22 0551 05/29/22 0419 05/30/22 0514  WBC 11.7* 10.4 12.8* 13.4* 14.8* 14.7*  NEUTROABS 8.4* 6.9 8.0*  --   --   --   HGB 12.2 11.4* 11.6* 12.4 11.3* 10.5*  HCT 39.4 36.7 38.0 40.1 35.8* 34.0*  MCV 101.8* 100.8* 102.2* 100.8* 99.4 101.5*  PLT 166 144* 236 341 356 378   Lab Results  Component Value Date   TSH 0.865 02/27/2022   No results found for: "HGBA1C" Lab Results  Component Value Date   CHOL 176 04/06/2020   HDL 67 04/06/2020   LDLCALC 82 04/06/2020   TRIG 171 (H) 04/06/2020   CHOLHDL 2.6 04/06/2020    Significant Diagnostic Results in last 30 days:  VAS [place] LOWER EXTREMITY VENOUS (DVT)  Result Date: 05/27/2022  Lower Venous DVT Study Patient Name:  KASYN STOUFFER  Date of Exam:   05/26/2022 Medical Rec #: [id number]           Accession #:    [id number] Date of Birth: October 11, 1932           Patient Gender: F Patient Age:   29 years Exam Location[facility] [address] Procedure:      VAS [place] LOWER EXTREMITY VENOUS (DVT) Referring Phys: PRANAV PATEL --------------------------------------------------------------------------------  Indications: Edema.  Risk Factors: None identified. Limitations: Poor ultrasound/tissue interface and  patient positioning, patient pain tolerance. Comparison Study: No prior studies. Performing Technologist: Oliver Hum RVT  Examination Guidelines: A complete evaluation includes B-mode imaging, spectral Doppler, color Doppler, and power Doppler as needed of all accessible portions of each vessel. Bilateral testing is considered an integral part of a complete examination. Limited examinations for reoccurring indications may be performed as noted. The reflux portion of the exam is performed with the patient in reverse Trendelenburg.  +---------+---------------+---------+-----------+----------+--------------+ RIGHT    CompressibilityPhasicitySpontaneityPropertiesThrombus Aging +---------+---------------+---------+-----------+----------+--------------+ CFV      Full           Yes      Yes                                 +---------+---------------+---------+-----------+----------+--------------+ SFJ      Full                                                        +---------+---------------+---------+-----------+----------+--------------+  FV Prox  Full                                                        +---------+---------------+---------+-----------+----------+--------------+ FV Mid                  Yes      Yes                                 +---------+---------------+---------+-----------+----------+--------------+ FV Distal               Yes      Yes                                 +---------+---------------+---------+-----------+----------+--------------+ PFV      Full                                                        +---------+---------------+---------+-----------+----------+--------------+ POP      Full           Yes      Yes                                 +---------+---------------+---------+-----------+----------+--------------+ PTV      Partial                                      Acute           +---------+---------------+---------+-----------+----------+--------------+ PERO     Full                                                        +---------+---------------+---------+-----------+----------+--------------+   +---------+---------------+---------+-----------+----------+--------------+ LEFT     CompressibilityPhasicitySpontaneityPropertiesThrombus Aging +---------+---------------+---------+-----------+----------+--------------+ CFV      Full           Yes      Yes                                 +---------+---------------+---------+-----------+----------+--------------+ SFJ      Full                                                        +---------+---------------+---------+-----------+----------+--------------+ FV Prox  Full                                                        +---------+---------------+---------+-----------+----------+--------------+  FV Mid                  Yes      Yes                                 +---------+---------------+---------+-----------+----------+--------------+ FV Distal               Yes      Yes                                 +---------+---------------+---------+-----------+----------+--------------+ PFV      Full                                                        +---------+---------------+---------+-----------+----------+--------------+ POP      Full           Yes      Yes                                 +---------+---------------+---------+-----------+----------+--------------+ PTV      Full                                                        +---------+---------------+---------+-----------+----------+--------------+ PERO     Full                                                        +---------+---------------+---------+-----------+----------+--------------+     Summary: RIGHT: - Findings consistent with acute deep vein thrombosis involving the right posterior tibial veins. - No  cystic structure found in the popliteal fossa.  LEFT: - There is no evidence of deep vein thrombosis in the lower extremity. However, portions of this examination were limited- see technologist comments above.  - No cystic structure found in the popliteal fossa.  *See table(s) above for measurements and observations. Electronically signed by Jamelle Haring on 05/27/2022 at 12:13:15 PM.    Final    DG Ankle Complete Right  Result Date: 05/26/2022 CLINICAL DATA:  Pain EXAM: RIGHT ANKLE - COMPLETE 3+ VIEW COMPARISON:  None Available. FINDINGS: No fracture or dislocation is seen. There are no focal lytic lesions. There is soft tissue swelling around the ankle. There are faint calcifications in the soft tissues, possibly vascular. IMPRESSION: No fracture or dislocation is seen in the right ankle. There are no focal lytic lesions. Electronically Signed   By: Elmer Picker M.D.   On: 05/26/2022 15:51   DG Abd Portable 1V  Result Date: 05/25/2022 CLINICAL DATA:  [id number] abdominal distention. History of sepsis and fibromyalgia. EXAM: PORTABLE ABDOMEN - 1 VIEW COMPARISON:  None Available. FINDINGS: Bowel-gas pattern is unremarkable and nonobstructive. No radio-opaque calculi or other significant radiographic abnormality are seen. Moderate lumbar spondylosis. Intervertebral disc cages at L3-L4 and L4-L5. Severe degenerative changes at the  right hip joint with loss of the joint space, subchondral sclerosis and lucencies. IMPRESSION: Bowel-gas pattern is unremarkable. Lumbar spondylosis and severe degenerative changes of the right hip joint. Electronically Signed   By: Frazier Richards M.D.   On: 05/25/2022 14:58   US RENAL  Result Date: 05/22/2022 CLINICAL DATA:  Urinary tract infection. EXAM: RENAL / URINARY TRACT ULTRASOUND COMPLETE COMPARISON:  None Available. FINDINGS: Right Kidney: Renal measurements: 10.1 x 5.0 x 4.3 cm = volume: 116 mL. Echogenicity within normal limits. No mass or hydronephrosis  visualized. Left Kidney: Renal measurements: 9.0 x 5.3 x 4.4 cm = volume: 109 mL. Echogenicity within normal limits. 1.6 cm cyst noted in lower pole. No mass or hydronephrosis visualized. Bladder: Mild diffuse bladder wall thickening and trabeculation, suspicious for cystitis. Other: None. IMPRESSION: No evidence of renal mass or hydronephrosis. Mild diffuse bladder wall thickening, suspicious for cystitis. Electronically Signed   By: Marlaine Hind M.D.   On: 05/22/2022 14:58   ECHOCARDIOGRAM COMPLETE  Result Date: 05/22/2022    ECHOCARDIOGRAM REPORT   Patient Name:   YNEZ EUGENIO Date of Exam: 05/22/2022 Medical Rec #:  [id number]          Height:       63.0 in Accession #:    [id number]         Weight:       176.6 lb Date of Birth:  1932/07/04          BSA:          1.834 m Patient Age:    64 years           BP:           150/67 mmHg Patient Gender: F                  HR:           67 bpm. Exam Location:  Inpatient Procedure: 2D Echo, Cardiac Doppler and Color Doppler Indications:    CHF  History:        Patient has prior history of Echocardiogram examinations.  Sonographer:    Jyl Heinz Referring Phys: [id number] [place]  1. Left ventricular ejection fraction, by estimation, is 60 to 65%. The left ventricle has normal function. The left ventricle has no regional wall motion abnormalities. There is mild left ventricular hypertrophy. Left ventricular diastolic parameters are consistent with Grade I diastolic dysfunction (impaired relaxation).  2. Right ventricular systolic function is low normal. The right ventricular size is normal. There is normal pulmonary artery systolic pressure.  3. The mitral valve is grossly normal. No evidence of mitral valve regurgitation.  4. The aortic valve is tricuspid. Aortic valve regurgitation is not visualized.  5. The inferior vena cava is normal in size with <50% respiratory variability, suggesting right atrial pressure of 8 mmHg. Comparison(s): No  significant change from prior study. FINDINGS  Left Ventricle: Left ventricular ejection fraction, by estimation, is 60 to 65%. The left ventricle has normal function. The left ventricle has no regional wall motion abnormalities. The left ventricular internal cavity size was normal in size. There is  mild left ventricular hypertrophy. Left ventricular diastolic parameters are consistent with Grade I diastolic dysfunction (impaired relaxation). Right Ventricle: The right ventricular size is normal. Right ventricular systolic function is low normal. There is normal pulmonary artery systolic pressure. The tricuspid regurgitant velocity is 2.57 m/s, and with an assumed right atrial pressure of 8 mmHg, the estimated right ventricular systolic  pressure is 34.4 mmHg. Left Atrium: Left atrial size was normal in size. Right Atrium: Right atrial size was normal in size. Pericardium: There is no evidence of pericardial effusion. Mitral Valve: The mitral valve is grossly normal. No evidence of mitral valve regurgitation. Tricuspid Valve: The tricuspid valve is normal in structure. Tricuspid valve regurgitation is trivial. Aortic Valve: The aortic valve is tricuspid. Aortic valve regurgitation is not visualized. Aortic valve peak gradient measures 8.6 mmHg. Pulmonic Valve: Pulmonic valve regurgitation is not visualized. Aorta: The aortic root and ascending aorta are structurally normal, with no evidence of dilitation. Venous: The inferior vena cava is normal in size with less than 50% respiratory variability, suggesting right atrial pressure of 8 mmHg. IAS/Shunts: The interatrial septum was not well visualized.  LEFT VENTRICLE PLAX 2D LVIDd:         3.80 cm      Diastology LVIDs:         2.80 cm      LV e' medial:    4.68 cm/s LV PW:         1.00 cm      LV E/e' medial:  13.0 LV IVS:        1.20 cm      LV e' lateral:   6.85 cm/s LVOT diam:     2.00 cm      LV E/e' lateral: 8.9 LV SV:         54 LV SV Index:   29 LVOT Area:      3.14 cm  LV Volumes (MOD) LV vol d, MOD A2C: 105.0 ml LV vol d, MOD A4C: 96.0 ml LV vol s, MOD A2C: 43.3 ml LV vol s, MOD A4C: 45.8 ml LV SV MOD A2C:     61.7 ml LV SV MOD A4C:     96.0 ml LV SV MOD BP:      54.0 ml RIGHT VENTRICLE            IVC RV Basal diam:  3.10 cm    IVC diam: 2.00 cm RV Mid diam:    3.50 cm RV S prime:     8.27 cm/s TAPSE (M-mode): 1.8 cm LEFT ATRIUM           Index        RIGHT ATRIUM           Index LA diam:      3.00 cm 1.64 cm/m   RA Area:     12.30 cm LA Vol (A2C): 34.4 ml 18.76 ml/m  RA Volume:   28.60 ml  15.59 ml/m LA Vol (A4C): 44.0 ml 23.99 ml/m  AORTIC VALVE AV Area (Vmax): 1.91 cm AV Vmax:        147.00 cm/s AV Peak Grad:   8.6 mmHg LVOT Vmax:      89.40 cm/s LVOT Vmean:     64.400 cm/s LVOT VTI:       0.171 m  AORTA Ao Root diam: 3.10 cm Ao Asc diam:  2.70 cm MITRAL VALVE               TRICUSPID VALVE MV Area (PHT): 3.97 cm    TR Peak grad:   26.4 mmHg MV Decel Time: 191 msec    TR Vmax:        257.00 cm/s MV E velocity: 60.80 cm/s MV A velocity: 72.80 cm/s  SHUNTS MV E/A ratio:  0.84        Systemic VTI:  0.17  m                            Systemic Diam: 2.00 cm Phineas Inches Electronically signed by Phineas Inches Signature Date/Time: 05/22/2022/12:12:37 PM    Final    CT ABDOMEN PELVIS W CONTRAST  Result Date: 05/21/2022 CLINICAL DATA:  Left lower quadrant abdominal pain with nausea and vomiting. EXAM: CT ABDOMEN AND PELVIS WITH CONTRAST TECHNIQUE: Multidetector CT imaging of the abdomen and pelvis was performed using the standard protocol following bolus administration of intravenous contrast. RADIATION DOSE REDUCTION: This exam was performed according to the departmental dose-optimization program which includes automated exposure control, adjustment of the mA and/or kV according to patient size and/or use of iterative reconstruction technique. CONTRAST:  15m OMNIPAQUE IOHEXOL 300 MG/ML  SOLN COMPARISON:  11/15/2020. FINDINGS: Lower chest: No acute abnormality.  Hepatobiliary: No focal liver abnormality is seen. No gallstones, gallbladder wall thickening, or biliary dilatation. Pancreas: Unremarkable. No pancreatic ductal dilatation or surrounding inflammatory changes. Spleen: Normal in size without focal abnormality. Adrenals/Urinary Tract: Normal adrenal glands. Few small low-density renal lesions, largest from the anterior upper pole the left kidney, 1.8 cm, consistent with a cyst. Smaller lesions are too small to characterize but are likely cysts. These findings are stable. No other renal masses, no stones and no hydronephrosis. Normal ureters. Normal bladder. Stomach/Bowel: Stomach is within normal limits. Appendix appears normal. No evidence of bowel wall thickening, distention, or inflammatory changes. Vascular/Lymphatic: Minor aortic atherosclerosis. No aneurysm. No enlarged lymph nodes. Reproductive: Normal postmenopausal uterus. Prominent left ovary versus left ovary cyst, 3.9 cm in greatest dimension, without change from the prior exam allowing for mild differences in measurement technique. A 2.8 cm simple appearing cyst was also noted arising from the left ovary on an ultrasound dated 04/22/2018. No right adnexal masses. Other: No abdominal wall hernia.  No ascites. Musculoskeletal: No fracture or acute finding. No bone lesion. Prior interbody fusion at L3-L4 and L4-L5. Advanced arthropathic changes of the right hip joint. Findings stable from the prior CT. IMPRESSION: 1. No acute findings within the abdomen or pelvis. 2. Left ovarian cyst, approximally 3.9 cm, unchanged from the prior CT and likely without change from the pelvic ultrasound dated 04/22/2018. Given the stability, no follow-up is recommended. 3. Mild aortic atherosclerosis. Electronically Signed   By: DLajean ManesM.D.   On: 05/21/2022 14:57   DG Chest Portable 1 View  Result Date: 05/21/2022 CLINICAL DATA:  Rule out pneumonia EXAM: PORTABLE CHEST 1 VIEW COMPARISON:  02/27/2022 FINDINGS: The  heart size and mediastinal contours are within normal limits. Both lungs are clear. The visualized skeletal structures are unremarkable. IMPRESSION: No acute abnormality of the lungs in AP portable projection. Electronically Signed   By: ADelanna AhmadiM.D.   On: 05/21/2022 14:03    Assessment/Plan  1. E coli bacteremia -   completed antibiotic course -   resolved  2. Acute deep vein thrombosis (DVT) of distal end of right lower extremity (HCC) - apixaban (ELIQUIS) 5 MG TABS tablet; Take 1 tablet (5 mg total) by mouth 2 (two) times daily.  Dispense: 60 tablet; Refill: 0  3. Rheumatoid arthritis involving multiple sites with positive rheumatoid factor (HCC) - diclofenac Sodium (VOLTAREN) 1 % GEL; APPLY 2 TO 4 GRAMS TOPICALLY TO AFFECTED JOINTS UP TO 4 TIMES DAILY  Dispense: 400 g; Refill: 0 - traMADol (ULTRAM) 50 MG tablet; Take 1 tablet (50 mg total) by mouth 4 (four) times  daily.  Dispense: 60 tablet; Refill: 0 - predniSONE (DELTASONE) 10 MG tablet; Take 1 tablet (10 mg total) by mouth daily with breakfast.  Dispense: 30 tablet; Refill: 0  4. Chronic diastolic heart failure (HCC) - furosemide (LASIX) 80 MG tablet; Take 1 tablet (80 mg total) by mouth daily.  Dispense: 90 tablet; Refill: 2  5. Acute idiopathic gout of right ankle - colchicine 0.6 MG tablet; Take 1 tablet (0.6 mg total) by mouth daily.  Dispense: 30 tablet; Refill: 0  6. Pure hypercholesterolemia - atorvastatin (LIPITOR) 20 MG tablet; Take 1 tablet (20 mg total) by mouth daily.  Dispense: 30 tablet; Refill: 0  7. Hypokalemia - potassium chloride (MICRO-K) 10 MEQ CR capsule; Take 2 capsules (20 mEq total) by mouth 2 (two) times daily.  Dispense: 120 capsule; Refill: 0  8. Asthma, unspecified asthma severity, unspecified whether complicated, unspecified whether persistent - montelukast (SINGULAIR) 10 MG tablet; Take 1 tablet (10 mg total) by mouth at bedtime.  Dispense: 30 tablet; Refill: 0 - fexofenadine (ALLEGRA) 180 MG  tablet; Take 1 tablet (180 mg total) by mouth daily.  Dispense: 30 tablet; Refill: 0      I have e-prescribed medications.  Patient will have PT, OT and ST.  DME provided:  None  Total discharge time: Greater than 30 minutes  Greater than 50% was spent in counseling and coordination of care.   Discharge time involved coordination of the discharge process with social worker, nursing staff and therapy department.     Durenda Age, DNP, MSN, FNP-BC [facility] [phone] (Monday-Friday 8:00 a.m. - 5:00 p.m.) [phone] (after hours)

## 2022-06-21 DIAGNOSIS — B9620 Unspecified Escherichia coli [E. coli] as the cause of diseases classified elsewhere: Secondary | ICD-10-CM | POA: Diagnosis not present

## 2022-06-21 DIAGNOSIS — R2681 Unsteadiness on feet: Secondary | ICD-10-CM | POA: Diagnosis not present

## 2022-06-21 DIAGNOSIS — M6281 Muscle weakness (generalized): Secondary | ICD-10-CM | POA: Diagnosis not present

## 2022-06-21 DIAGNOSIS — R29898 Other symptoms and signs involving the musculoskeletal system: Secondary | ICD-10-CM | POA: Diagnosis not present

## 2022-06-21 DIAGNOSIS — N3946 Mixed incontinence: Secondary | ICD-10-CM | POA: Diagnosis not present

## 2022-06-21 DIAGNOSIS — M069 Rheumatoid arthritis, unspecified: Secondary | ICD-10-CM | POA: Diagnosis not present

## 2022-06-21 DIAGNOSIS — R0602 Shortness of breath: Secondary | ICD-10-CM | POA: Diagnosis not present

## 2022-06-22 DIAGNOSIS — M069 Rheumatoid arthritis, unspecified: Secondary | ICD-10-CM | POA: Diagnosis not present

## 2022-06-22 DIAGNOSIS — M6281 Muscle weakness (generalized): Secondary | ICD-10-CM | POA: Diagnosis not present

## 2022-06-22 DIAGNOSIS — B9620 Unspecified Escherichia coli [E. coli] as the cause of diseases classified elsewhere: Secondary | ICD-10-CM | POA: Diagnosis not present

## 2022-06-22 DIAGNOSIS — R2681 Unsteadiness on feet: Secondary | ICD-10-CM | POA: Diagnosis not present

## 2022-06-22 DIAGNOSIS — N3946 Mixed incontinence: Secondary | ICD-10-CM | POA: Diagnosis not present

## 2022-06-22 DIAGNOSIS — R29898 Other symptoms and signs involving the musculoskeletal system: Secondary | ICD-10-CM | POA: Diagnosis not present

## 2022-06-22 DIAGNOSIS — R0602 Shortness of breath: Secondary | ICD-10-CM | POA: Diagnosis not present

## 2022-06-26 DIAGNOSIS — M6281 Muscle weakness (generalized): Secondary | ICD-10-CM | POA: Diagnosis not present

## 2022-06-26 DIAGNOSIS — R2681 Unsteadiness on feet: Secondary | ICD-10-CM | POA: Diagnosis not present

## 2022-06-26 DIAGNOSIS — B9620 Unspecified Escherichia coli [E. coli] as the cause of diseases classified elsewhere: Secondary | ICD-10-CM | POA: Diagnosis not present

## 2022-06-26 DIAGNOSIS — N3946 Mixed incontinence: Secondary | ICD-10-CM | POA: Diagnosis not present

## 2022-06-26 DIAGNOSIS — R0602 Shortness of breath: Secondary | ICD-10-CM | POA: Diagnosis not present

## 2022-06-26 DIAGNOSIS — R29898 Other symptoms and signs involving the musculoskeletal system: Secondary | ICD-10-CM | POA: Diagnosis not present

## 2022-06-26 DIAGNOSIS — M069 Rheumatoid arthritis, unspecified: Secondary | ICD-10-CM | POA: Diagnosis not present

## 2022-06-27 NOTE — Progress Notes (Signed)
Cardiology Office Note:   Date:  06/29/2022  NAME:  Eileen Wilkerson    MRN: [id number] DOB:  05-06-1932   PCP:  Mast, [name], NP  Cardiologist:  None  Electrophysiologist:  None   Referring MD: Mast, [name], NP   Chief Complaint  Patient presents with   Follow-up    4 months.   Edema    Feet.   History of Present Illness:   Eileen Wilkerson is a 88 y.o. female with a hx of rheumatoid arthritis, DVT who presents for follow-up of shortness of breath and fatigue.  Echo normal.  BNP normal.  Chest x-ray normal.  Symptoms likely related to chronic prednisone use. Recently admitted to the hospital in June with UTI.  Diagnosed with right lower extremity DVT.  Currently on Eliquis.  EKG is reviewed.  Likely demonstrate sinus tachycardia.  There is no mention of atrial flutter or atrial fibrillation on telemetry.  Weights are stable.  She is still short of breath and not that active.  She is working with physical therapy.  She still remains on chronic prednisone.  I believe this could be contributing.  Her blood pressure is stable.  She does have evidence of poor circulation and venous insufficiency in her lower extremities.  We discussed leg elevation as well as reducing her salt intake.  She is still on Lasix which can be continued.  She continues to have no signs of congestive heart failure.  All of her testing has been normal.  I believe deconditioning is likely contributing a lot.  Problem List RA DVT -05/2022 3. RBBB/LAFB 4. Sinus tachycardia   Past Medical History: Past Medical History:  Diagnosis Date   Adrenal failure ([place])    Arthritis    Asthma    Cancer ([place])    Cataract    Closed nondisplaced fracture of fifth right metatarsal bone 09/18/2017   Diverticulitis    Per patient   Fibromyalgia 2008   HCAP (healthcare-associated pneumonia) 02/03/2018   Osteoporosis    RA (rheumatoid arthritis) ([place])    Recurrent upper respiratory infection (URI)    Sepsis due to urinary  tract infection ([place]) 01/18/2018   Urticaria     Past Surgical History: Past Surgical History:  Procedure Laterality Date   BACK SURGERY     BILATERAL CARPAL TUNNEL RELEASE  2005   right and left   CATARACT EXTRACTION, BILATERAL  2004   right and left   CERVICAL FUSION  2011,2010,2008   2 disks   HEEL SPUR SURGERY  2004   lower back fusion  2011   Fusion of 3-4 and 4-5 lower back   RADIOFREQUENCY ABLATION  2020   ROTATOR CUFF REPAIR  [id number]   SQUAMOUS CELL CARCINOMA EXCISION     TONSILLECTOMY AND ADENOIDECTOMY  1947   TOTAL SHOULDER ARTHROPLASTY      Current Medications: Current Meds  Medication Sig   apixaban (ELIQUIS) 5 MG TABS tablet Take 1 tablet (5 mg total) by mouth 2 (two) times daily.   atorvastatin (LIPITOR) 20 MG tablet Take 1 tablet (20 mg total) by mouth daily.   Biotin 5 MG TBDP Take 1 tablet (5 mg total) by mouth daily.   Cholecalciferol (VITAMIN D3) 125 MCG (5000 UT) CAPS Take 1 capsule (5,000 Units total) by mouth daily.   colchicine 0.6 MG tablet Take 1 tablet (0.6 mg total) by mouth daily.   CRANBERRY PO Take 1 capsule by mouth daily.   cycloSPORINE (RESTASIS) 0.05 %  ophthalmic emulsion Place 1 drop into both eyes 2 (two) times daily.   diclofenac Sodium (VOLTAREN) 1 % GEL APPLY 2 TO 4 GRAMS TOPICALLY TO AFFECTED JOINTS UP TO 4 TIMES DAILY   fexofenadine (ALLEGRA) 180 MG tablet Take 1 tablet (180 mg total) by mouth daily.   furosemide (LASIX) 80 MG tablet Take 1 tablet (80 mg total) by mouth daily.   ketoconazole (NIZORAL) 2 % shampoo Apply 1 Application topically once a week.   lansoprazole (PREVACID) 30 MG capsule Take 30 mg by mouth daily at 12 noon.   montelukast (SINGULAIR) 10 MG tablet Take 1 tablet (10 mg total) by mouth at bedtime.   potassium chloride (MICRO-K) 10 MEQ CR capsule Take 2 capsules (20 mEq total) by mouth 2 (two) times daily.   predniSONE (DELTASONE) 10 MG tablet Take 1 tablet (10 mg total) by mouth daily with breakfast.    simethicone (MYLICON) 80 MG chewable tablet Chew 1 tablet (80 mg total) by mouth 4 (four) times daily as needed for flatulence.   traMADol (ULTRAM) 50 MG tablet Take 1 tablet (50 mg total) by mouth 4 (four) times daily.   vitamin B-12 (CYANOCOBALAMIN) 1000 MCG tablet Take 1 tablet (1,000 mcg total) by mouth daily.     Allergies:    Adhesive [tape], Celebrex [celecoxib], Ciprofibrate, Cymbalta [duloxetine hcl], Gabitril [tiagabine], Lyrica [pregabalin], Neurontin [gabapentin], Nexium [esomeprazole], Nsaids, Shrimp [shellfish allergy], Azactam [aztreonam], Azelastine hcl, Ciprofloxacin, Claritin [loratadine], Methotrexate derivatives, Nasacort [triamcinolone], Olopatadine, Other, Sulfamethizole, Zantac [ranitidine hcl], Claritin-d 12 hour [loratadine-pseudoephedrine er], Keflex [cephalexin], Penicillins, and Sulfa antibiotics   Social History: Social History   Socioeconomic History   Marital status: Widowed    Spouse name: Not on file   Number of children: 2   Years of education: Not on file   Highest education level: Not on file  Occupational History   Occupation: Retired Education officer, museum  Tobacco Use   Smoking status: Former    Packs/day: 0.75    Years: 10.00    Total pack years: 7.50    Types: Cigarettes    Quit date: 1968    Years since quitting: 55.5   Smokeless tobacco: Never  Vaping Use   Vaping Use: Never used  Substance and Sexual Activity   Alcohol use: No   Drug use: No   Sexual activity: Not Currently  Other Topics Concern   Not on file  Social History Narrative      Diet:        Do you drink/ eat things with caffeine? Dr. Malachi Bonds 2/ day      Marital status: Widowed                              What year were you married ? 1953      Do you live in a house, apartment,assistred living, condo, trailer, etc.)? Apartment      Is it one or more stories?       How many persons live in your home ? 1      Do you have any pets in your home ?(please list) No       Highest Level of education completed: PhD       Current or past profession: Transport planner, Education officer, museum, Special Educator       Do you exercise?   No  Type & how often       ADVANCED DIRECTIVES (Please bring copies)      Do you have a living will? Yes      Do you have a DNR form?                       If not, do you want to discuss one? Yes      Do you have signed POA?HPOA forms?                 If so, please bring to your appointment Tes      FUNCTIONAL STATUS- To be completed by Spouse / child / Staff       Do you have difficulty bathing or dressing yourself ? No      Do you have difficulty preparing food or eating ? No      Do you have difficulty managing your mediation ? No      Do you have difficulty managing your finances ? No      Do you have difficulty affording your medication ? No      Social Determinants of Radio broadcast assistant Strain: Not on file  Food Insecurity: Not on file  Transportation Needs: Not on file  Physical Activity: Not on file  Stress: Not on file  Social Connections: Not on file     Family History: The patient's family history includes Breast cancer (age of onset: 55) in her mother; Congestive Heart Failure (age of onset: 21) in her father; Diabetes in her father; Heart attack in her maternal grandmother and paternal grandfather; Heart disease in her father. There is no history of Allergic rhinitis, Asthma, Eczema, or Urticaria.  ROS:   All other ROS reviewed and negative. Pertinent positives noted in the HPI.     EKGs/Labs/Other Studies Reviewed:   The following studies were personally reviewed by me today:  TTE 05/22/2022  1. Left ventricular ejection fraction, by estimation, is 60 to 65%. The  left ventricle has normal function. The left ventricle has no regional  wall motion abnormalities. There is mild left ventricular hypertrophy.  Left ventricular diastolic parameters  are consistent with Grade I  diastolic dysfunction (impaired relaxation).   2. Right ventricular systolic function is low normal. The right  ventricular size is normal. There is normal pulmonary artery systolic  pressure.   3. The mitral valve is grossly normal. No evidence of mitral valve  regurgitation.   4. The aortic valve is tricuspid. Aortic valve regurgitation is not  visualized.   5. The inferior vena cava is normal in size with <50% respiratory  variability, suggesting right atrial pressure of 8 mmHg.   Recent Labs: 02/27/2022: BNP 33.7; TSH 0.865 05/27/2022: ALT 10 05/30/2022: BUN 24; Creatinine, Ser 0.87; Hemoglobin 10.5; Magnesium 2.2; Platelets 378; Potassium 3.9; Sodium 143   Recent Lipid Panel    Component Value Date/Time   CHOL 176 04/06/2020 0730   TRIG 171 (H) 04/06/2020 0730   HDL 67 04/06/2020 0730   CHOLHDL 2.6 04/06/2020 0730   VLDL 29.2 12/30/2018 1439   LDLCALC 82 04/06/2020 0730    Physical Exam:   VS:  BP 118/70 (BP Location: Right Wrist, Patient Position: Sitting, Cuff Size: Normal)   Pulse 91   Ht 5' 2.5" (1.588 m)   Wt 180 lb (81.6 kg)   BMI 32.40 kg/m    Wt Readings from Last 3 Encounters:  06/29/22 180 lb (81.6 kg)  06/20/22 179 lb 4.8 oz (81.3 kg)  06/06/22 184 lb 6.4 oz (83.6 kg)    General: Well nourished, well developed, in no acute distress Head: Atraumatic, normal size  Eyes: PEERLA, EOMI  Neck: Supple, no JVD Endocrine: No thryomegaly Cardiac: Normal S1, S2; RRR; no murmurs, rubs, or gallops Lungs: Clear to auscultation bilaterally, no wheezing, rhonchi or rales  Abd: Soft, nontender, no hepatomegaly  Ext: Trace lower extremity edema Musculoskeletal: No deformities, BUE and BLE strength normal and equal Skin: Warm and dry, no rashes   Neuro: Alert and oriented to person, place, time, and situation, CNII-XII grossly intact, no focal deficits  Psych: Normal mood and affect   ASSESSMENT:   Eileen Wilkerson is a 88 y.o. female who presents for the  following: 1. SOB (shortness of breath)   2. Tachycardia   3. RBBB   4. LAFB (left anterior fascicular block)   5. Leg edema     PLAN:   1. SOB (shortness of breath) -Echo normal.  BNP normal.  No signs of volume overload.  I believe her lower extremity edema is likely related to venous insufficiency.  I have recommended leg elevation as well as low-salt diet.  She can continue with Lasix as she is taking.  Her weights are stable.  There are no signs of congestive heart failure. -I believe a lot of her symptoms are related to deconditioning as well as chronic prednisone use.  She is working with her primary care physician on both.  2. Tachycardia -EKGs from the hospital demonstrate likely sinus tachycardia.  Do not believe this represents atrial flutter.  She is in normal rhythm today.  Pulse is regular.  Echo is normal.  3. RBBB 4. LAFB (left anterior fascicular block) -Normal LV function.  She can be monitored by her primary care physician.  No signs or indication for pacing.  5. Leg edema -Likely related to venous insufficiency.  Continue Lasix.  Continue leg elevation.  Continue compression stockings.      Disposition: Return if symptoms worsen or fail to improve.  Medication Adjustments/Labs and Tests Ordered: Current medicines are reviewed at length with the patient today.  Concerns regarding medicines are outlined above.  No orders of the defined types were placed in this encounter.  No orders of the defined types were placed in this encounter.   Patient Instructions  Medication Instructions:  The current medical regimen is effective;  continue present plan and medications.  *If you need a refill on your cardiac medications before your next appointment, please call your pharmacy*   Follow-Up: At [facility], you and your health needs are our priority.  As part of our continuing mission to provide you with exceptional heart care, we have created designated Provider  Care Teams.  These Care Teams include your primary Cardiologist (physician) and Advanced Practice Providers (APPs -  Physician Assistants and Nurse Practitioners) who all work together to provide you with the care you need, when you need it.  We recommend signing up for the patient portal called "MyChart".  Sign up information is provided on this After Visit Summary.  MyChart is used to connect with patients for Virtual Visits (Telemedicine).  Patients are able to view lab/test results, encounter notes, upcoming appointments, etc.  Non-urgent messages can be sent to your provider as well.   To learn more about what you can do with MyChart, go to [email or web address].    Your next appointment:   As needed  The format  for your next appointment:   In Person  Provider:   Eleonore Chiquito, MD             Time Spent with Patient: I have spent a total of 25 minutes with patient reviewing hospital notes, telemetry, EKGs, labs and examining the patient as well as establishing an assessment and plan that was discussed with the patient.  > 50% of time was spent in direct patient care.  Signed, Addison Naegeli. Audie Box, MD, [place]  [address], [place], [place] [address] [phone]  06/29/2022 3:28 PM

## 2022-06-28 DIAGNOSIS — N3946 Mixed incontinence: Secondary | ICD-10-CM | POA: Diagnosis not present

## 2022-06-28 DIAGNOSIS — R2681 Unsteadiness on feet: Secondary | ICD-10-CM | POA: Diagnosis not present

## 2022-06-28 DIAGNOSIS — M069 Rheumatoid arthritis, unspecified: Secondary | ICD-10-CM | POA: Diagnosis not present

## 2022-06-28 DIAGNOSIS — B9620 Unspecified Escherichia coli [E. coli] as the cause of diseases classified elsewhere: Secondary | ICD-10-CM | POA: Diagnosis not present

## 2022-06-28 DIAGNOSIS — R29898 Other symptoms and signs involving the musculoskeletal system: Secondary | ICD-10-CM | POA: Diagnosis not present

## 2022-06-28 DIAGNOSIS — M6281 Muscle weakness (generalized): Secondary | ICD-10-CM | POA: Diagnosis not present

## 2022-06-28 DIAGNOSIS — R0602 Shortness of breath: Secondary | ICD-10-CM | POA: Diagnosis not present

## 2022-06-29 ENCOUNTER — Ambulatory Visit: Payer: Medicare PPO | Admitting: Cardiovascular Disease

## 2022-06-29 ENCOUNTER — Encounter: Payer: Self-pay | Admitting: Cardiovascular Disease

## 2022-06-29 VITALS — BP 118/70 | HR 91 | Ht 62.5 in | Wt 180.0 lb

## 2022-06-29 DIAGNOSIS — R0602 Shortness of breath: Secondary | ICD-10-CM | POA: Diagnosis not present

## 2022-06-29 DIAGNOSIS — R000 Tachycardia, unspecified: Secondary | ICD-10-CM | POA: Diagnosis not present

## 2022-06-29 DIAGNOSIS — I4510 Unspecified right bundle-branch block: Secondary | ICD-10-CM | POA: Diagnosis not present

## 2022-06-29 DIAGNOSIS — R600 Localized edema: Secondary | ICD-10-CM | POA: Diagnosis not present

## 2022-06-29 DIAGNOSIS — I444 Left anterior fascicular block: Secondary | ICD-10-CM

## 2022-06-29 NOTE — Patient Instructions (Signed)
Medication Instructions:  The current medical regimen is effective;  continue present plan and medications.  *If you need a refill on your cardiac medications before your next appointment, please call your pharmacy*    Follow-Up: At [facility], you and your health needs are our priority.  As part of our continuing mission to provide you with exceptional heart care, we have created designated Provider Care Teams.  These Care Teams include your primary Cardiologist (physician) and Advanced Practice Providers (APPs -  Physician Assistants and Nurse Practitioners) who all work together to provide you with the care you need, when you need it.  We recommend signing up for the patient portal called "MyChart".  Sign up information is provided on this After Visit Summary.  MyChart is used to connect with patients for Virtual Visits (Telemedicine).  Patients are able to view lab/test results, encounter notes, upcoming appointments, etc.  Non-urgent messages can be sent to your provider as well.   To learn more about what you can do with MyChart, go to [email or web address].    Your next appointment:   As needed  The format for your next appointment:   In Person  Provider:   Vernon O'Neal, MD      

## 2022-07-03 DIAGNOSIS — N3946 Mixed incontinence: Secondary | ICD-10-CM | POA: Diagnosis not present

## 2022-07-03 DIAGNOSIS — B9620 Unspecified Escherichia coli [E. coli] as the cause of diseases classified elsewhere: Secondary | ICD-10-CM | POA: Diagnosis not present

## 2022-07-03 DIAGNOSIS — R29898 Other symptoms and signs involving the musculoskeletal system: Secondary | ICD-10-CM | POA: Diagnosis not present

## 2022-07-03 DIAGNOSIS — M6281 Muscle weakness (generalized): Secondary | ICD-10-CM | POA: Diagnosis not present

## 2022-07-03 DIAGNOSIS — R0602 Shortness of breath: Secondary | ICD-10-CM | POA: Diagnosis not present

## 2022-07-03 DIAGNOSIS — R2681 Unsteadiness on feet: Secondary | ICD-10-CM | POA: Diagnosis not present

## 2022-07-03 DIAGNOSIS — M069 Rheumatoid arthritis, unspecified: Secondary | ICD-10-CM | POA: Diagnosis not present

## 2022-07-05 DIAGNOSIS — R29898 Other symptoms and signs involving the musculoskeletal system: Secondary | ICD-10-CM | POA: Diagnosis not present

## 2022-07-05 DIAGNOSIS — M6281 Muscle weakness (generalized): Secondary | ICD-10-CM | POA: Diagnosis not present

## 2022-07-05 DIAGNOSIS — R0602 Shortness of breath: Secondary | ICD-10-CM | POA: Diagnosis not present

## 2022-07-05 DIAGNOSIS — N3946 Mixed incontinence: Secondary | ICD-10-CM | POA: Diagnosis not present

## 2022-07-05 DIAGNOSIS — R2681 Unsteadiness on feet: Secondary | ICD-10-CM | POA: Diagnosis not present

## 2022-07-05 DIAGNOSIS — M069 Rheumatoid arthritis, unspecified: Secondary | ICD-10-CM | POA: Diagnosis not present

## 2022-07-05 DIAGNOSIS — B9620 Unspecified Escherichia coli [E. coli] as the cause of diseases classified elsewhere: Secondary | ICD-10-CM | POA: Diagnosis not present

## 2022-07-06 DIAGNOSIS — R2681 Unsteadiness on feet: Secondary | ICD-10-CM | POA: Diagnosis not present

## 2022-07-06 DIAGNOSIS — B9620 Unspecified Escherichia coli [E. coli] as the cause of diseases classified elsewhere: Secondary | ICD-10-CM | POA: Diagnosis not present

## 2022-07-06 DIAGNOSIS — R29898 Other symptoms and signs involving the musculoskeletal system: Secondary | ICD-10-CM | POA: Diagnosis not present

## 2022-07-06 DIAGNOSIS — M069 Rheumatoid arthritis, unspecified: Secondary | ICD-10-CM | POA: Diagnosis not present

## 2022-07-06 DIAGNOSIS — E2749 Other adrenocortical insufficiency: Secondary | ICD-10-CM | POA: Diagnosis not present

## 2022-07-06 DIAGNOSIS — M545 Low back pain, unspecified: Secondary | ICD-10-CM | POA: Diagnosis not present

## 2022-07-06 DIAGNOSIS — M6281 Muscle weakness (generalized): Secondary | ICD-10-CM | POA: Diagnosis not present

## 2022-07-06 DIAGNOSIS — M810 Age-related osteoporosis without current pathological fracture: Secondary | ICD-10-CM | POA: Diagnosis not present

## 2022-07-06 DIAGNOSIS — M1380 Other specified arthritis, unspecified site: Secondary | ICD-10-CM | POA: Diagnosis not present

## 2022-07-06 DIAGNOSIS — R0602 Shortness of breath: Secondary | ICD-10-CM | POA: Diagnosis not present

## 2022-07-06 DIAGNOSIS — N3946 Mixed incontinence: Secondary | ICD-10-CM | POA: Diagnosis not present

## 2022-07-10 ENCOUNTER — Other Ambulatory Visit: Payer: Self-pay | Admitting: Adult Health

## 2022-07-10 DIAGNOSIS — N3946 Mixed incontinence: Secondary | ICD-10-CM | POA: Diagnosis not present

## 2022-07-10 DIAGNOSIS — M6281 Muscle weakness (generalized): Secondary | ICD-10-CM | POA: Diagnosis not present

## 2022-07-10 DIAGNOSIS — M069 Rheumatoid arthritis, unspecified: Secondary | ICD-10-CM | POA: Diagnosis not present

## 2022-07-10 DIAGNOSIS — R29898 Other symptoms and signs involving the musculoskeletal system: Secondary | ICD-10-CM | POA: Diagnosis not present

## 2022-07-10 DIAGNOSIS — R0602 Shortness of breath: Secondary | ICD-10-CM | POA: Diagnosis not present

## 2022-07-10 DIAGNOSIS — M0579 Rheumatoid arthritis with rheumatoid factor of multiple sites without organ or systems involvement: Secondary | ICD-10-CM

## 2022-07-10 DIAGNOSIS — B9620 Unspecified Escherichia coli [E. coli] as the cause of diseases classified elsewhere: Secondary | ICD-10-CM | POA: Diagnosis not present

## 2022-07-10 DIAGNOSIS — R2681 Unsteadiness on feet: Secondary | ICD-10-CM | POA: Diagnosis not present

## 2022-07-10 NOTE — Telephone Encounter (Signed)
Patient has request refill on medication Prednisone, and Tramadol. Patient medication Prednisone last refilled 06/20/2022, and Tramadol 06/20/2022. Patient has Allergy Contraindications. Medications pend and sent to PCP Mast, [name], NP.

## 2022-07-12 DIAGNOSIS — B9620 Unspecified Escherichia coli [E. coli] as the cause of diseases classified elsewhere: Secondary | ICD-10-CM | POA: Diagnosis not present

## 2022-07-12 DIAGNOSIS — N3946 Mixed incontinence: Secondary | ICD-10-CM | POA: Diagnosis not present

## 2022-07-12 DIAGNOSIS — M6281 Muscle weakness (generalized): Secondary | ICD-10-CM | POA: Diagnosis not present

## 2022-07-12 DIAGNOSIS — R0602 Shortness of breath: Secondary | ICD-10-CM | POA: Diagnosis not present

## 2022-07-12 DIAGNOSIS — M069 Rheumatoid arthritis, unspecified: Secondary | ICD-10-CM | POA: Diagnosis not present

## 2022-07-12 DIAGNOSIS — R29898 Other symptoms and signs involving the musculoskeletal system: Secondary | ICD-10-CM | POA: Diagnosis not present

## 2022-07-13 ENCOUNTER — Other Ambulatory Visit: Payer: Self-pay | Admitting: Nurse Practitioner

## 2022-07-13 DIAGNOSIS — R29898 Other symptoms and signs involving the musculoskeletal system: Secondary | ICD-10-CM | POA: Diagnosis not present

## 2022-07-13 DIAGNOSIS — R0602 Shortness of breath: Secondary | ICD-10-CM | POA: Diagnosis not present

## 2022-07-13 DIAGNOSIS — B9620 Unspecified Escherichia coli [E. coli] as the cause of diseases classified elsewhere: Secondary | ICD-10-CM | POA: Diagnosis not present

## 2022-07-13 DIAGNOSIS — N3946 Mixed incontinence: Secondary | ICD-10-CM | POA: Diagnosis not present

## 2022-07-13 DIAGNOSIS — E7800 Pure hypercholesterolemia, unspecified: Secondary | ICD-10-CM

## 2022-07-13 DIAGNOSIS — M6281 Muscle weakness (generalized): Secondary | ICD-10-CM | POA: Diagnosis not present

## 2022-07-13 DIAGNOSIS — M069 Rheumatoid arthritis, unspecified: Secondary | ICD-10-CM | POA: Diagnosis not present

## 2022-07-13 NOTE — Telephone Encounter (Signed)
Patient has request refill on medication Atorvastatin. Patient medication has High Risk Warnings. Medication pend and sent to PCP Mast, [name], NP for approval.

## 2022-07-17 ENCOUNTER — Other Ambulatory Visit: Payer: Self-pay | Admitting: Nurse Practitioner

## 2022-07-17 DIAGNOSIS — M6281 Muscle weakness (generalized): Secondary | ICD-10-CM | POA: Diagnosis not present

## 2022-07-17 DIAGNOSIS — R29898 Other symptoms and signs involving the musculoskeletal system: Secondary | ICD-10-CM | POA: Diagnosis not present

## 2022-07-17 DIAGNOSIS — N3946 Mixed incontinence: Secondary | ICD-10-CM | POA: Diagnosis not present

## 2022-07-17 DIAGNOSIS — M069 Rheumatoid arthritis, unspecified: Secondary | ICD-10-CM | POA: Diagnosis not present

## 2022-07-17 DIAGNOSIS — B9620 Unspecified Escherichia coli [E. coli] as the cause of diseases classified elsewhere: Secondary | ICD-10-CM | POA: Diagnosis not present

## 2022-07-17 DIAGNOSIS — R0602 Shortness of breath: Secondary | ICD-10-CM | POA: Diagnosis not present

## 2022-07-19 DIAGNOSIS — M6281 Muscle weakness (generalized): Secondary | ICD-10-CM | POA: Diagnosis not present

## 2022-07-19 DIAGNOSIS — M069 Rheumatoid arthritis, unspecified: Secondary | ICD-10-CM | POA: Diagnosis not present

## 2022-07-19 DIAGNOSIS — B9620 Unspecified Escherichia coli [E. coli] as the cause of diseases classified elsewhere: Secondary | ICD-10-CM | POA: Diagnosis not present

## 2022-07-19 DIAGNOSIS — R29898 Other symptoms and signs involving the musculoskeletal system: Secondary | ICD-10-CM | POA: Diagnosis not present

## 2022-07-19 DIAGNOSIS — N3946 Mixed incontinence: Secondary | ICD-10-CM | POA: Diagnosis not present

## 2022-07-19 DIAGNOSIS — R0602 Shortness of breath: Secondary | ICD-10-CM | POA: Diagnosis not present

## 2022-07-20 DIAGNOSIS — R29898 Other symptoms and signs involving the musculoskeletal system: Secondary | ICD-10-CM | POA: Diagnosis not present

## 2022-07-20 DIAGNOSIS — B9620 Unspecified Escherichia coli [E. coli] as the cause of diseases classified elsewhere: Secondary | ICD-10-CM | POA: Diagnosis not present

## 2022-07-20 DIAGNOSIS — R0602 Shortness of breath: Secondary | ICD-10-CM | POA: Diagnosis not present

## 2022-07-20 DIAGNOSIS — M069 Rheumatoid arthritis, unspecified: Secondary | ICD-10-CM | POA: Diagnosis not present

## 2022-07-20 DIAGNOSIS — M6281 Muscle weakness (generalized): Secondary | ICD-10-CM | POA: Diagnosis not present

## 2022-07-20 DIAGNOSIS — N3946 Mixed incontinence: Secondary | ICD-10-CM | POA: Diagnosis not present

## 2022-07-22 ENCOUNTER — Other Ambulatory Visit: Payer: Self-pay | Admitting: Adult Health

## 2022-08-07 ENCOUNTER — Other Ambulatory Visit: Payer: Self-pay | Admitting: Nurse Practitioner

## 2022-08-07 DIAGNOSIS — M0579 Rheumatoid arthritis with rheumatoid factor of multiple sites without organ or systems involvement: Secondary | ICD-10-CM

## 2022-08-08 NOTE — Telephone Encounter (Signed)
Patient has request refill on medication Tramadol '50mg'$ . Patient medication last refilled 07/10/2022. Patient was given 60 tablets to be taken 4 times daily. That is 15 day supply for patient. Patient last Non Opioid Contract signed 07/20/2020. Patient has no upcoming appointments. Medication pend and sent to PCP Mast, [name], NP for approval. Please Advise.

## 2022-08-22 ENCOUNTER — Other Ambulatory Visit: Payer: Self-pay | Admitting: Nurse Practitioner

## 2022-08-22 DIAGNOSIS — M0579 Rheumatoid arthritis with rheumatoid factor of multiple sites without organ or systems involvement: Secondary | ICD-10-CM

## 2022-08-22 NOTE — Telephone Encounter (Signed)
Rx last refilled on 08/08/2022. Treatment agreement is out dated, notation made to update on pending appointment 08/23/2022

## 2022-08-23 ENCOUNTER — Non-Acute Institutional Stay (INDEPENDENT_AMBULATORY_CARE_PROVIDER_SITE_OTHER): Payer: Medicare PPO | Admitting: Family Medicine

## 2022-08-23 ENCOUNTER — Encounter: Payer: Self-pay | Admitting: Family Medicine

## 2022-08-23 VITALS — BP 124/82 | HR 65 | Temp 96.6°F | Ht 62.5 in | Wt 180.4 lb

## 2022-08-23 DIAGNOSIS — M79605 Pain in left leg: Secondary | ICD-10-CM

## 2022-08-23 DIAGNOSIS — M47816 Spondylosis without myelopathy or radiculopathy, lumbar region: Secondary | ICD-10-CM | POA: Diagnosis not present

## 2022-08-23 DIAGNOSIS — M79604 Pain in right leg: Secondary | ICD-10-CM

## 2022-08-23 MED ORDER — TRAMADOL HCL ER 100 MG PO TB24: 100.0000 mg | ORAL_TABLET | Freq: Every day | ORAL | 1 refills | Status: DC

## 2022-08-23 NOTE — Patient Instructions (Addendum)
Discontinue the Colchicine Take Eliquis for only one more month then stop taking

## 2022-08-23 NOTE — Progress Notes (Signed)
Provider:  Alain Honey, MD  Careteam: [name], [name], [name]  PLACE OF SERVICE:  [facility]  Advanced Directive information    Allergies  Allergen Reactions   Adhesive [Tape] Rash   Celebrex [Celecoxib] Hives   Ciprofibrate Nausea Only   Cymbalta [Duloxetine Hcl] Swelling   Gabitril [Tiagabine] Swelling   Lyrica [Pregabalin] Swelling   Neurontin [Gabapentin] Swelling   Nexium [Esomeprazole] Rash   Nsaids Rash   Shrimp [Shellfish Allergy] Anaphylaxis    Per patient "shrimp only"   Azactam [Aztreonam]     Hand swelling    Azelastine Hcl     Rash    Ciprofloxacin Other (See Comments)    dizziness   Claritin [Loratadine]     Irritability Nervousness    Methotrexate Derivatives    Nasacort [Triamcinolone]     Dizzy    Olopatadine Other (See Comments)    Pain and lethargy    Other    Sulfamethizole Other (See Comments)    unknown   Zantac [Ranitidine Hcl] Other (See Comments)    unknown   Claritin-D 12 Hour [Loratadine-Pseudoephedrine Er] Anxiety   Keflex [Cephalexin] Nausea And Vomiting    Tolerated Ancef   Penicillins Rash    Injection site reaction. Tolerated cefepime in past. Also reports tolerating a penicillin infusion after this initial rxn ~20 yrs ago.  Has patient had a PCN reaction causing immediate rash, facial/tongue/throat swelling, SOB or lightheadedness with hypotension: No Has patient had a PCN reaction causing severe rash involving mucus membranes or skin necrosis: No Has patient had a PCN reaction that required hospitalization No Has patient had a PCN reaction occurring within the last 10 years: No     Sulfa Antibiotics Nausea And Vomiting    Chief Complaint  Patient presents with   Acute Visit    Patient presents today for left leg pain. She reports that it have worsen and keeps her up at bedtime.     HPI: Patient is a 88 y.o. female .  Complains of bilateral leg pain.  She has been  given tramadol for leg pain but that is not as effective as it once was however if she doubles up and takes 100 mg pain is relieved.  She denies any back pain but she does have a history of spondylosis of the lumbar region.  She has been seen in the past by [place] neurosurgery. She also has questions about discontinuing her Eliquis.  She had an isolated lower extremity DVT.  By convention I think we could treat that for 4 months and she is in her third month.  She was also started on colchicine for gout but only 8.6 mg/day.  I am not sure that is really helping to ward off or treat gout so I have recommended to discontinue that as well  Review of Systems:  Review of Systems  Constitutional: Negative.   Respiratory: Negative.    Cardiovascular: Negative.  Negative for claudication and leg swelling.  Musculoskeletal: Negative.   Neurological: Negative.   Psychiatric/Behavioral: Negative.    All other systems reviewed and are negative.   Past Medical History:  Diagnosis Date   Adrenal failure ([place])    Arthritis    Asthma    Cancer ([place])    Cataract    Closed nondisplaced fracture of fifth right metatarsal bone 09/18/2017   Diverticulitis    Per patient   Fibromyalgia 2008   HCAP (healthcare-associated pneumonia)  02/03/2018   Osteoporosis    RA (rheumatoid arthritis) ([place])    Recurrent upper respiratory infection (URI)    Sepsis due to urinary tract infection ([place]) 01/18/2018   Urticaria    Past Surgical History:  Procedure Laterality Date   BACK SURGERY     BILATERAL CARPAL TUNNEL RELEASE  2005   right and left   CATARACT EXTRACTION, BILATERAL  2004   right and left   CERVICAL FUSION  2011,2010,2008   2 disks   HEEL SPUR SURGERY  2004   lower back fusion  2011   Fusion of 3-4 and 4-5 lower back   RADIOFREQUENCY ABLATION  2020   ROTATOR CUFF REPAIR  [id number]   SQUAMOUS CELL CARCINOMA EXCISION     TONSILLECTOMY AND ADENOIDECTOMY  1947   TOTAL SHOULDER ARTHROPLASTY      Social History:   reports that she quit smoking about 55 years ago. Her smoking use included cigarettes. She has a 7.50 pack-year smoking history. She has never used smokeless tobacco. She reports that she does not drink alcohol and does not use drugs.  Family History  Problem Relation Age of Onset   Heart attack Maternal Grandmother    Heart attack Paternal Grandfather    Breast cancer Mother 74   Diabetes Father    Heart disease Father    Congestive Heart Failure Father 78       Died from   Allergic rhinitis Neg Hx    Asthma Neg Hx    Eczema Neg Hx    Urticaria Neg Hx     Medications: Patient's Medications  New Prescriptions   TRAMADOL (ULTRAM-ER) 100 MG 24 HR TABLET    Take 1 tablet (100 mg total) by mouth daily.  Previous Medications   APIXABAN (ELIQUIS) 5 MG TABS TABLET    Take 1 tablet (5 mg total) by mouth 2 (two) times daily.   ATORVASTATIN (LIPITOR) 20 MG TABLET    TAKE 1 TABLET BY MOUTH EVERY DAY   BIOTIN 5 MG TBDP    Take 1 tablet (5 mg total) by mouth daily.   CHOLECALCIFEROL (VITAMIN D3) 125 MCG (5000 UT) CAPS    TAKE 1 CAPSULE BY MOUTH EVERY DAY   COLCHICINE 0.6 MG TABLET    Take 1 tablet (0.6 mg total) by mouth daily.   CRANBERRY PO    Take 1 capsule by mouth daily.   CYCLOSPORINE (RESTASIS) 0.05 % OPHTHALMIC EMULSION    Place 1 drop into both eyes 2 (two) times daily.   DICLOFENAC SODIUM (VOLTAREN) 1 % GEL    APPLY 2 TO 4 GRAMS TOPICALLY TO AFFECTED JOINTS UP TO 4 TIMES DAILY   FEXOFENADINE (ALLEGRA) 180 MG TABLET    Take 1 tablet (180 mg total) by mouth daily.   FUROSEMIDE (LASIX) 80 MG TABLET    Take 1 tablet (80 mg total) by mouth daily.   KETOCONAZOLE (NIZORAL) 2 % SHAMPOO    Apply 1 Application topically once a week.   LANSOPRAZOLE (PREVACID) 30 MG CAPSULE    TAKE 1 CAPSULE BY MOUTH ONCE DAILY AT NOON   MONTELUKAST (SINGULAIR) 10 MG TABLET    Take 1 tablet (10 mg total) by mouth at bedtime.   POTASSIUM CHLORIDE (MICRO-K) 10 MEQ CR CAPSULE    Take 2  capsules (20 mEq total) by mouth 2 (two) times daily.   PREDNISONE (DELTASONE) 10 MG TABLET    TAKE 1 TABLET (10 MG TOTAL) BY MOUTH DAILY WITH BREAKFAST.   VITAMIN  B-12 (CYANOCOBALAMIN) 1000 MCG TABLET    Take 1 tablet (1,000 mcg total) by mouth daily.  Modified Medications   No medications on file  Discontinued Medications   SIMETHICONE (MYLICON) 80 MG CHEWABLE TABLET    Chew 1 tablet (80 mg total) by mouth 4 (four) times daily as needed for flatulence.   TRAMADOL (ULTRAM) 50 MG TABLET    TAKE 1 TABLET BY MOUTH FOUR TIMES A DAY    Physical Exam:  Vitals:   08/23/22 1356  BP: 124/82  Pulse: 65  Temp: (!) 96.6 F (35.9 C)  SpO2: 96%  Weight: 180 lb 6.4 oz (81.8 kg)  Height: 5' 2.5" (1.588 m)   Body mass index is 32.47 kg/m. Wt Readings from Last 3 Encounters:  08/23/22 180 lb 6.4 oz (81.8 kg)  06/29/22 180 lb (81.6 kg)  06/20/22 179 lb 4.8 oz (81.3 kg)    Physical Exam Vitals and nursing note reviewed.  Constitutional:      Appearance: Normal appearance.  Cardiovascular:     Rate and Rhythm: Normal rate and regular rhythm.  Pulmonary:     Effort: Pulmonary effort is normal.  Musculoskeletal:     Comments: Legs: Straight leg raising negative Deep tendon reflexes are depressed but symmetric Pulses are present There is no swelling or erythema  Neurological:     General: No focal deficit present.     Mental Status: She is alert and oriented to person, place, and time.     Labs reviewed: Basic Metabolic Panel: Recent Labs    02/27/22 1635 05/21/22 1205 05/28/22 0551 05/29/22 0419 05/30/22 0514  NA  --    < > 140 140 143  K  --    < > 3.3* 4.1 3.9  CL  --    < > 99 102 104  CO2  --    < > '30 29 30  '$ GLUCOSE  --    < > 114* 178* 157*  BUN  --    < > 9 20 24*  CREATININE  --    < > 0.83 0.85 0.87  CALCIUM  --    < > 9.5 9.5 9.1  MG  --    < > 2.0 2.3 2.2  TSH 0.865  --   --   --   --    < > = values in this interval not displayed.   Liver Function  Tests: Recent Labs    05/25/22 0554 05/26/22 0535 05/27/22 0543  AST 18 13* 14*  ALT '16 11 10  '$ ALKPHOS 62 49 49  BILITOT 0.6 0.6 0.8  PROT 6.7 5.7* 6.1*  ALBUMIN 3.2* 2.7* 2.9*   Recent Labs    05/21/22 1205  LIPASE 22   No results for input(s): "AMMONIA" in the last 8760 hours. CBC: Recent Labs    05/25/22 0554 05/26/22 0535 05/27/22 0543 05/28/22 0551 05/29/22 0419 05/30/22 0514  WBC 11.7* 10.4 12.8* 13.4* 14.8* 14.7*  NEUTROABS 8.4* 6.9 8.0*  --   --   --   HGB 12.2 11.4* 11.6* 12.4 11.3* 10.5*  HCT 39.4 36.7 38.0 40.1 35.8* 34.0*  MCV 101.8* 100.8* 102.2* 100.8* 99.4 101.5*  PLT 166 144* 236 341 356 378   Lipid Panel: No results for input(s): "CHOL", "HDL", "LDLCALC", "TRIG", "CHOLHDL", "LDLDIRECT" in the last 8760 hours. TSH: Recent Labs    02/27/22 1635  TSH 0.865   A1C: No results found for: "HGBA1C"   Assessment/Plan  1. Spondylosis of lumbar region  without myelopathy or radiculopathy It is been some years since this diagnosis was made and I suspect she will need further imaging to define what is going on.  Would suspect spinal stenosis at her age  58. Leg pain, bilateral Refer to neurosurgery for further evaluation, possible MRI.  We will increase tramadol to 100 mg extended release.  She still has some 50 mg pills which she could take for  breakthrough pain   Alain Honey, MD [place] [phone]

## 2022-08-26 ENCOUNTER — Other Ambulatory Visit: Payer: Self-pay | Admitting: Adult Health

## 2022-08-26 DIAGNOSIS — J45909 Unspecified asthma, uncomplicated: Secondary | ICD-10-CM

## 2022-08-26 DIAGNOSIS — M10071 Idiopathic gout, right ankle and foot: Secondary | ICD-10-CM

## 2022-08-28 NOTE — Telephone Encounter (Signed)
High risk or very high risk warning populated when attempting to refill colchicine 0.6 mg. RX request sent to PCP for review and approval if warranted.

## 2022-09-18 ENCOUNTER — Other Ambulatory Visit: Payer: Self-pay | Admitting: Family Medicine

## 2022-09-18 DIAGNOSIS — M79605 Pain in left leg: Secondary | ICD-10-CM

## 2022-09-18 NOTE — Telephone Encounter (Signed)
Patient is requesting a refill of the following medications: Requested Prescriptions   Pending Prescriptions Disp Refills   traMADol HCl 100 MG TABS [Pharmacy Med Name: traMADol HCl 100 MG Oral Tablet] 15 tablet 0    Sig: Take 1 tablet by mouth once daily    Date of last refill: 08/23/2022   Refill amount: #15  Treatment agreement date: Not on file and no pending appointment

## 2022-09-25 ENCOUNTER — Other Ambulatory Visit: Payer: Self-pay | Admitting: Nurse Practitioner

## 2022-09-25 DIAGNOSIS — M0579 Rheumatoid arthritis with rheumatoid factor of multiple sites without organ or systems involvement: Secondary | ICD-10-CM

## 2022-10-02 ENCOUNTER — Other Ambulatory Visit: Payer: Self-pay | Admitting: Nurse Practitioner

## 2022-10-02 DIAGNOSIS — M79604 Pain in right leg: Secondary | ICD-10-CM

## 2022-10-02 NOTE — Telephone Encounter (Signed)
Patient has request refill on medication Tramadol '100mg'$ . Patient last refill dated 09/18/2022. Patient last Non Opioid Contract signed 07/20/2020. Medication pend and sent to PCP Mast, [name], NP for approval.

## 2022-10-06 ENCOUNTER — Other Ambulatory Visit: Payer: Self-pay | Admitting: Nurse Practitioner

## 2022-10-06 DIAGNOSIS — M79605 Pain in left leg: Secondary | ICD-10-CM

## 2022-10-09 NOTE — Telephone Encounter (Signed)
Hello ManXie,   Shunda does not have a pending appointment. Please confirm that she is an independent resident and if so when should she follow-up for a routine visit? (Last routine visit was 05/03/22, other wise patient see for acute concerns)  Tramadol was last refilled on 10/02/2022 (7 days ago) with #15 tablets, please approve pending refill request if warranted. Patient may need a higher dispense number.   No treatment agreement on file. I will make a notation on next appt to sign one, once I know when patient should follow-up.

## 2022-10-10 ENCOUNTER — Other Ambulatory Visit: Payer: Self-pay | Admitting: Nurse Practitioner

## 2022-10-26 ENCOUNTER — Other Ambulatory Visit: Payer: Self-pay | Admitting: Nurse Practitioner

## 2022-10-26 DIAGNOSIS — M0579 Rheumatoid arthritis with rheumatoid factor of multiple sites without organ or systems involvement: Secondary | ICD-10-CM

## 2022-10-27 ENCOUNTER — Inpatient Hospital Stay (HOSPITAL_COMMUNITY)
Admission: EM | Admit: 2022-10-27 | Discharge: 2022-11-01 | DRG: 872 | Disposition: A | Discharge status: Skilled Nursing Facility | Payer: Medicare PPO | Attending: Internal Medicine | Admitting: Internal Medicine

## 2022-10-27 ENCOUNTER — Encounter (HOSPITAL_COMMUNITY): Payer: Self-pay | Admitting: Oncology

## 2022-10-27 ENCOUNTER — Emergency Department (HOSPITAL_COMMUNITY): Payer: Medicare PPO

## 2022-10-27 ENCOUNTER — Other Ambulatory Visit: Payer: Self-pay

## 2022-10-27 DIAGNOSIS — Z882 Allergy status to sulfonamides status: Secondary | ICD-10-CM

## 2022-10-27 DIAGNOSIS — K5790 Diverticulosis of intestine, part unspecified, without perforation or abscess without bleeding: Secondary | ICD-10-CM | POA: Diagnosis not present

## 2022-10-27 DIAGNOSIS — Z981 Arthrodesis status: Secondary | ICD-10-CM | POA: Diagnosis not present

## 2022-10-27 DIAGNOSIS — R197 Diarrhea, unspecified: Secondary | ICD-10-CM | POA: Diagnosis present

## 2022-10-27 DIAGNOSIS — M810 Age-related osteoporosis without current pathological fracture: Secondary | ICD-10-CM | POA: Diagnosis present

## 2022-10-27 DIAGNOSIS — R0689 Other abnormalities of breathing: Secondary | ICD-10-CM | POA: Diagnosis not present

## 2022-10-27 DIAGNOSIS — M79604 Pain in right leg: Secondary | ICD-10-CM

## 2022-10-27 DIAGNOSIS — N3001 Acute cystitis with hematuria: Secondary | ICD-10-CM | POA: Diagnosis not present

## 2022-10-27 DIAGNOSIS — Z87891 Personal history of nicotine dependence: Secondary | ICD-10-CM | POA: Diagnosis not present

## 2022-10-27 DIAGNOSIS — B964 Proteus (mirabilis) (morganii) as the cause of diseases classified elsewhere: Secondary | ICD-10-CM | POA: Diagnosis present

## 2022-10-27 DIAGNOSIS — Z886 Allergy status to analgesic agent status: Secondary | ICD-10-CM

## 2022-10-27 DIAGNOSIS — M1990 Unspecified osteoarthritis, unspecified site: Secondary | ICD-10-CM | POA: Diagnosis present

## 2022-10-27 DIAGNOSIS — M797 Fibromyalgia: Secondary | ICD-10-CM | POA: Diagnosis present

## 2022-10-27 DIAGNOSIS — Z91013 Allergy to seafood: Secondary | ICD-10-CM

## 2022-10-27 DIAGNOSIS — R000 Tachycardia, unspecified: Secondary | ICD-10-CM | POA: Diagnosis not present

## 2022-10-27 DIAGNOSIS — Z803 Family history of malignant neoplasm of breast: Secondary | ICD-10-CM

## 2022-10-27 DIAGNOSIS — Z1152 Encounter for screening for COVID-19: Secondary | ICD-10-CM

## 2022-10-27 DIAGNOSIS — M109 Gout, unspecified: Secondary | ICD-10-CM | POA: Diagnosis present

## 2022-10-27 DIAGNOSIS — J45909 Unspecified asthma, uncomplicated: Secondary | ICD-10-CM | POA: Diagnosis not present

## 2022-10-27 DIAGNOSIS — Z881 Allergy status to other antibiotic agents status: Secondary | ICD-10-CM

## 2022-10-27 DIAGNOSIS — E872 Acidosis, unspecified: Secondary | ICD-10-CM | POA: Diagnosis present

## 2022-10-27 DIAGNOSIS — R52 Pain, unspecified: Secondary | ICD-10-CM | POA: Diagnosis not present

## 2022-10-27 DIAGNOSIS — Z87440 Personal history of urinary (tract) infections: Secondary | ICD-10-CM

## 2022-10-27 DIAGNOSIS — Z8249 Family history of ischemic heart disease and other diseases of the circulatory system: Secondary | ICD-10-CM

## 2022-10-27 DIAGNOSIS — Z833 Family history of diabetes mellitus: Secondary | ICD-10-CM | POA: Diagnosis not present

## 2022-10-27 DIAGNOSIS — Z79899 Other long term (current) drug therapy: Secondary | ICD-10-CM

## 2022-10-27 DIAGNOSIS — M79661 Pain in right lower leg: Secondary | ICD-10-CM | POA: Diagnosis not present

## 2022-10-27 DIAGNOSIS — R531 Weakness: Secondary | ICD-10-CM | POA: Diagnosis not present

## 2022-10-27 DIAGNOSIS — E7800 Pure hypercholesterolemia, unspecified: Secondary | ICD-10-CM | POA: Diagnosis present

## 2022-10-27 DIAGNOSIS — N390 Urinary tract infection, site not specified: Secondary | ICD-10-CM | POA: Diagnosis present

## 2022-10-27 DIAGNOSIS — Z888 Allergy status to other drugs, medicaments and biological substances status: Secondary | ICD-10-CM | POA: Diagnosis not present

## 2022-10-27 DIAGNOSIS — K219 Gastro-esophageal reflux disease without esophagitis: Secondary | ICD-10-CM | POA: Diagnosis not present

## 2022-10-27 DIAGNOSIS — R509 Fever, unspecified: Secondary | ICD-10-CM | POA: Diagnosis not present

## 2022-10-27 DIAGNOSIS — A419 Sepsis, unspecified organism: Secondary | ICD-10-CM | POA: Diagnosis not present

## 2022-10-27 DIAGNOSIS — Z7401 Bed confinement status: Secondary | ICD-10-CM | POA: Diagnosis not present

## 2022-10-27 DIAGNOSIS — J9811 Atelectasis: Secondary | ICD-10-CM | POA: Diagnosis not present

## 2022-10-27 DIAGNOSIS — R0902 Hypoxemia: Secondary | ICD-10-CM | POA: Diagnosis not present

## 2022-10-27 DIAGNOSIS — I959 Hypotension, unspecified: Secondary | ICD-10-CM | POA: Diagnosis not present

## 2022-10-27 DIAGNOSIS — A4159 Other Gram-negative sepsis: Secondary | ICD-10-CM | POA: Diagnosis not present

## 2022-10-27 DIAGNOSIS — R6520 Severe sepsis without septic shock: Secondary | ICD-10-CM | POA: Diagnosis not present

## 2022-10-27 DIAGNOSIS — M069 Rheumatoid arthritis, unspecified: Secondary | ICD-10-CM | POA: Diagnosis present

## 2022-10-27 DIAGNOSIS — I82409 Acute embolism and thrombosis of unspecified deep veins of unspecified lower extremity: Secondary | ICD-10-CM | POA: Diagnosis present

## 2022-10-27 DIAGNOSIS — Z86718 Personal history of other venous thrombosis and embolism: Secondary | ICD-10-CM | POA: Diagnosis not present

## 2022-10-27 DIAGNOSIS — R059 Cough, unspecified: Secondary | ICD-10-CM | POA: Diagnosis not present

## 2022-10-27 DIAGNOSIS — R110 Nausea: Secondary | ICD-10-CM | POA: Diagnosis not present

## 2022-10-27 DIAGNOSIS — R0989 Other specified symptoms and signs involving the circulatory and respiratory systems: Secondary | ICD-10-CM | POA: Diagnosis not present

## 2022-10-27 DIAGNOSIS — Z880 Allergy status to penicillin: Secondary | ICD-10-CM

## 2022-10-27 DIAGNOSIS — M7989 Other specified soft tissue disorders: Secondary | ICD-10-CM | POA: Diagnosis not present

## 2022-10-27 DIAGNOSIS — R4182 Altered mental status, unspecified: Secondary | ICD-10-CM | POA: Diagnosis not present

## 2022-10-27 DIAGNOSIS — E785 Hyperlipidemia, unspecified: Secondary | ICD-10-CM | POA: Diagnosis not present

## 2022-10-27 DIAGNOSIS — M79672 Pain in left foot: Secondary | ICD-10-CM | POA: Diagnosis not present

## 2022-10-27 DIAGNOSIS — M0579 Rheumatoid arthritis with rheumatoid factor of multiple sites without organ or systems involvement: Secondary | ICD-10-CM | POA: Diagnosis not present

## 2022-10-27 HISTORY — DX: Bacteremia: R78.81

## 2022-10-27 HISTORY — DX: Unspecified Escherichia coli (E. coli) as the cause of diseases classified elsewhere: B96.20

## 2022-10-27 LAB — CBC WITH DIFFERENTIAL/PLATELET
Abs Immature Granulocytes: 0.12 10*3/uL — ABNORMAL HIGH (ref 0.00–0.07)
Basophils Absolute: 0.1 10*3/uL (ref 0.0–0.1)
Basophils Relative: 1 %
Eosinophils Absolute: 0 10*3/uL (ref 0.0–0.5)
Eosinophils Relative: 0 %
HCT: 41.9 % (ref 36.0–46.0)
Hemoglobin: 13 g/dL (ref 12.0–15.0)
Immature Granulocytes: 1 %
Lymphocytes Relative: 19 %
Lymphs Abs: 3.5 10*3/uL (ref 0.7–4.0)
MCH: 30.2 pg (ref 26.0–34.0)
MCHC: 31 g/dL (ref 30.0–36.0)
MCV: 97.2 fL (ref 80.0–100.0)
Monocytes Absolute: 2.1 10*3/uL — ABNORMAL HIGH (ref 0.1–1.0)
Monocytes Relative: 12 %
Neutro Abs: 12.7 10*3/uL — ABNORMAL HIGH (ref 1.7–7.7)
Neutrophils Relative %: 67 %
Platelets: 280 10*3/uL (ref 150–400)
RBC: 4.31 MIL/uL (ref 3.87–5.11)
RDW: 14.1 % (ref 11.5–15.5)
WBC: 18.6 10*3/uL — ABNORMAL HIGH (ref 4.0–10.5)
nRBC: 0 % (ref 0.0–0.2)

## 2022-10-27 LAB — LACTIC ACID, PLASMA
Lactic Acid, Venous: 2.4 mmol/L (ref 0.5–1.9)
Lactic Acid, Venous: 3.2 mmol/L (ref 0.5–1.9)
Lactic Acid, Venous: 1.7 mmol/L (ref 0.5–1.9)
Lactic Acid, Venous: 1.4 mmol/L (ref 0.5–1.9)

## 2022-10-27 LAB — COMPREHENSIVE METABOLIC PANEL
ALT: 17 U/L (ref 0–44)
AST: 25 U/L (ref 15–41)
Albumin: 3.7 g/dL (ref 3.5–5.0)
Alkaline Phosphatase: 61 U/L (ref 38–126)
Anion gap: 12 (ref 5–15)
BUN: 10 mg/dL (ref 8–23)
CO2: 26 mmol/L (ref 22–32)
Calcium: 9.4 mg/dL (ref 8.9–10.3)
Chloride: 101 mmol/L (ref 98–111)
Creatinine, Ser: 1.17 mg/dL — ABNORMAL HIGH (ref 0.44–1.00)
GFR, Estimated: 45 mL/min — ABNORMAL LOW (ref >60)
Glucose, Bld: 129 mg/dL — ABNORMAL HIGH (ref 70–99)
Potassium: 3.3 mmol/L — ABNORMAL LOW (ref 3.5–5.1)
Sodium: 139 mmol/L (ref 135–145)
Total Bilirubin: 1.4 mg/dL — ABNORMAL HIGH (ref 0.3–1.2)
Total Protein: 7.6 g/dL (ref 6.5–8.1)

## 2022-10-27 LAB — PROTIME-INR
INR: 1.2 (ref 0.8–1.2)
Prothrombin Time: 14.6 seconds (ref 11.4–15.2)

## 2022-10-27 LAB — TROPONIN I (HIGH SENSITIVITY)
Troponin I (High Sensitivity): 33 ng/L — ABNORMAL HIGH (ref <18)
Troponin I (High Sensitivity): 30 ng/L — ABNORMAL HIGH (ref <18)

## 2022-10-27 LAB — URINALYSIS, ROUTINE W REFLEX MICROSCOPIC
Bilirubin Urine: NEGATIVE
Glucose, UA: NEGATIVE mg/dL
Ketones, ur: NEGATIVE mg/dL
Nitrite: POSITIVE — AB
Protein, ur: 100 mg/dL — AB
Specific Gravity, Urine: <1.005 — ABNORMAL LOW (ref 1.005–1.030)
pH: >9 — ABNORMAL HIGH (ref 5.0–8.0)

## 2022-10-27 LAB — RESP PANEL BY RT-PCR (FLU A&B, COVID) ARPGX2
Influenza A by PCR: NEGATIVE
Influenza B by PCR: NEGATIVE
SARS Coronavirus 2 by RT PCR: NEGATIVE

## 2022-10-27 LAB — APTT: aPTT: 34 seconds (ref 24–36)

## 2022-10-27 LAB — URINALYSIS, MICROSCOPIC (REFLEX)

## 2022-10-27 MED ORDER — COLCHICINE 0.6 MG PO TABS
0.6000 mg | ORAL_TABLET | Freq: Every day | ORAL | Status: DC
  Administered 2022-10-27 – 2022-10-29 (×3): 0.6 mg via ORAL
  Filled 2022-10-27 (×3): qty 1

## 2022-10-27 MED ORDER — ENOXAPARIN SODIUM 40 MG/0.4ML IJ SOSY
40.0000 mg | PREFILLED_SYRINGE | INTRAMUSCULAR | Status: DC
  Administered 2022-10-27 – 2022-10-31 (×5): 40 mg via SUBCUTANEOUS
  Filled 2022-10-27 (×5): qty 0.4

## 2022-10-27 MED ORDER — VANCOMYCIN HCL IN DEXTROSE 1-5 GM/200ML-% IV SOLN
1000.0000 mg | Freq: Once | INTRAVENOUS | Status: AC
  Administered 2022-10-27: 1000 mg via INTRAVENOUS
  Filled 2022-10-27: qty 200

## 2022-10-27 MED ORDER — ONDANSETRON HCL 4 MG/2ML IJ SOLN: 4.0000 mg | Freq: Four times a day (QID) | INTRAMUSCULAR | Status: DC | PRN

## 2022-10-27 MED ORDER — CYCLOSPORINE 0.05 % OP EMUL
1.0000 [drp] | Freq: Two times a day (BID) | OPHTHALMIC | Status: DC
  Administered 2022-10-28 – 2022-11-01 (×9): 1 [drp] via OPHTHALMIC
  Filled 2022-10-27 (×11): qty 30

## 2022-10-27 MED ORDER — IPRATROPIUM-ALBUTEROL 0.5-2.5 (3) MG/3ML IN SOLN: 3.0000 mL | RESPIRATORY_TRACT | Status: DC | PRN

## 2022-10-27 MED ORDER — MELATONIN 5 MG PO TABS
10.0000 mg | ORAL_TABLET | Freq: Every day | ORAL | Status: DC
  Administered 2022-10-27 – 2022-10-31 (×4): 10 mg via ORAL
  Filled 2022-10-27 (×5): qty 2

## 2022-10-27 MED ORDER — PREDNISONE 10 MG PO TABS
10.0000 mg | ORAL_TABLET | Freq: Every day | ORAL | Status: DC
  Administered 2022-10-28 – 2022-10-30 (×3): 10 mg via ORAL
  Filled 2022-10-27 (×3): qty 1

## 2022-10-27 MED ORDER — ACETAMINOPHEN 325 MG PO TABS
650.0000 mg | ORAL_TABLET | Freq: Four times a day (QID) | ORAL | Status: DC | PRN
  Administered 2022-10-27 – 2022-10-31 (×8): 650 mg via ORAL
  Filled 2022-10-27 (×8): qty 2

## 2022-10-27 MED ORDER — LACTATED RINGERS IV BOLUS (SEPSIS)
500.0000 mL | Freq: Once | INTRAVENOUS | Status: AC
  Administered 2022-10-27: 500 mL via INTRAVENOUS

## 2022-10-27 MED ORDER — METRONIDAZOLE 500 MG/100ML IV SOLN
500.0000 mg | Freq: Once | INTRAVENOUS | Status: AC
  Administered 2022-10-27: 500 mg via INTRAVENOUS
  Filled 2022-10-27: qty 100

## 2022-10-27 MED ORDER — LACTATED RINGERS IV BOLUS (SEPSIS)
1000.0000 mL | Freq: Once | INTRAVENOUS | Status: AC
  Administered 2022-10-27: 1000 mL via INTRAVENOUS

## 2022-10-27 MED ORDER — ONDANSETRON HCL 4 MG PO TABS: 4.0000 mg | ORAL_TABLET | Freq: Four times a day (QID) | ORAL | Status: DC | PRN

## 2022-10-27 MED ORDER — ACETAMINOPHEN 650 MG RE SUPP
RECTAL | Status: AC
  Administered 2022-10-27: 650 mg via RECTAL
  Filled 2022-10-27: qty 1

## 2022-10-27 MED ORDER — PANTOPRAZOLE SODIUM 40 MG PO TBEC
40.0000 mg | DELAYED_RELEASE_TABLET | Freq: Every day | ORAL | Status: DC
  Administered 2022-10-28 – 2022-11-01 (×5): 40 mg via ORAL
  Filled 2022-10-27 (×5): qty 1

## 2022-10-27 MED ORDER — LORATADINE 10 MG PO TABS
10.0000 mg | ORAL_TABLET | Freq: Every day | ORAL | Status: DC
  Administered 2022-10-27 – 2022-11-01 (×6): 10 mg via ORAL
  Filled 2022-10-27 (×6): qty 1

## 2022-10-27 MED ORDER — SODIUM CHLORIDE 0.9 % IV SOLN
2.0000 g | INTRAVENOUS | Status: DC
  Administered 2022-10-27 – 2022-10-30 (×4): 2 g via INTRAVENOUS
  Filled 2022-10-27 (×4): qty 20

## 2022-10-27 MED ORDER — SODIUM CHLORIDE 0.9 % IV SOLN
2.0000 g | Freq: Once | INTRAVENOUS | Status: AC
  Administered 2022-10-27: 2 g via INTRAVENOUS
  Filled 2022-10-27: qty 12.5

## 2022-10-27 MED ORDER — HYDROCORTISONE SOD SUC (PF) 100 MG IJ SOLR
100.0000 mg | Freq: Once | INTRAMUSCULAR | Status: AC
  Administered 2022-10-27: 100 mg via INTRAVENOUS
  Filled 2022-10-27: qty 2

## 2022-10-27 MED ORDER — ACETAMINOPHEN 650 MG RE SUPP: 650.0000 mg | Freq: Four times a day (QID) | RECTAL | Status: DC | PRN

## 2022-10-27 MED ORDER — ACETAMINOPHEN 650 MG RE SUPP: 650.0000 mg | Freq: Once | RECTAL | Status: AC

## 2022-10-27 MED ORDER — LACTATED RINGERS IV SOLN: INTRAVENOUS | Status: AC

## 2022-10-27 MED ORDER — MONTELUKAST SODIUM 10 MG PO TABS
10.0000 mg | ORAL_TABLET | Freq: Every day | ORAL | Status: DC
  Administered 2022-10-27 – 2022-10-31 (×5): 10 mg via ORAL
  Filled 2022-10-27 (×5): qty 1

## 2022-10-27 MED ORDER — ATORVASTATIN CALCIUM 20 MG PO TABS
20.0000 mg | ORAL_TABLET | Freq: Every day | ORAL | Status: DC
  Administered 2022-10-27 – 2022-11-01 (×6): 20 mg via ORAL
  Filled 2022-10-27 (×6): qty 1

## 2022-10-27 NOTE — ED Notes (Signed)
IV to right hand lost. 22G peripheral IV to left hand started by Denton Ar, RN. Pt also has temperature foley placed by Denton Ar, RN however unable to document d/t malfunction w/ Epic.

## 2022-10-27 NOTE — H&P (Signed)
History and Physical    Patient: Eileen Wilkerson ZOX:[id number] DOB: 28-Dec-1931 DOA: 10/27/2022 DOS: the patient was seen and examined on 10/27/2022 PCP: Mast, [name], NP  Patient coming from: Home  Chief Complaint:  Chief Complaint  Patient presents with   Blood Infection   HPI: Eileen Wilkerson is a 88 y.o. female with medical history significant of adrenal failure, rheumatoid arthritis on prednisone, asthma, diverticulosis, diverticulitis, history of multiple UTIs, history of E. coli bacteremia, fibromyalgia, GERD, HCAP, hyperlipidemia, osteoporosis, recurring upper respiratory infections, urticaria who was brought from her facility due to decreased mentation, productive cough and fever.  EMS stated she was covered on diarrheal feces.  Her O2 sat was 84% and was temporarily placed on NRB oxygen at 15 LPM.  The patient initially was only responding to voice and touch, but was not speaking.  However, after fluids and antibiotics she was able to answer some simple questions.  She denied any headache, dyspnea, abdominal, chest or back pain at that time.  She stated she is not having flank pain/urinary symptoms.  Previous CODE STATUS confirmed.  She would like everything to be done, but no ET intubation/mechanical ventilation.  ED course: Initial vital signs temperature 103.7 F pulse 110, respiration 30, BP 127/67 mmHg O2 sat 84% on room air.  The patient received acetaminophen 650 mg PR x1, 2500 mL of LR bolus, vancomycin, metronidazole and cefepime.  Lab work: Urinalysis showed moderate hemoglobinuria, positive nitrites and moderate leukocyte esterase with many bacteria.  Lactic acid 2.4 then 3.2 mmol/L.  Normal PT, INR and PTT.  Her CBC showed a white count of 18.6, hemoglobin 13.0 g/dL platelets 280.  CMP with a potassium of 3.3, glucose 129, creatinine 1.17 and total bilirubin 1.4.  Imaging: Two-view chest radiograph with no active cardiopulmonary disease.  Review of Systems: As  mentioned in the history of present illness. All other systems reviewed and are negative. Past Medical History:  Diagnosis Date   Adrenal failure ([place])    Arthritis    Asthma    Cancer ([place])    Cataract    Closed nondisplaced fracture of fifth right metatarsal bone 09/18/2017   Diverticulitis    Per patient   E coli bacteremia    Fibromyalgia 2008   GERD (gastroesophageal reflux disease) 12/18/2016   HCAP (healthcare-associated pneumonia) 02/03/2018   Hyperlipidemia 10/13/2021   Osteoporosis    RA (rheumatoid arthritis) ([place])    Recurrent upper respiratory infection (URI)    Sepsis due to urinary tract infection ([place]) 01/18/2018   Urticaria    Past Surgical History:  Procedure Laterality Date   BACK SURGERY     BILATERAL CARPAL TUNNEL RELEASE  2005   right and left   CATARACT EXTRACTION, BILATERAL  2004   right and left   CERVICAL FUSION  2011,2010,2008   2 disks   HEEL SPUR SURGERY  2004   lower back fusion  2011   Fusion of 3-4 and 4-5 lower back   RADIOFREQUENCY ABLATION  2020   ROTATOR CUFF REPAIR  [id number]   SQUAMOUS CELL CARCINOMA EXCISION     TONSILLECTOMY AND ADENOIDECTOMY  1947   TOTAL SHOULDER ARTHROPLASTY     Social History:  reports that she quit smoking about 55 years ago. Her smoking use included cigarettes. She has a 7.50 pack-year smoking history. She has never used smokeless tobacco. She reports that she does not drink alcohol and does not use drugs.  Allergies  Allergen Reactions   Adhesive [Tape]  Rash   Celebrex [Celecoxib] Hives   Ciprofibrate Nausea Only   Cymbalta [Duloxetine Hcl] Swelling   Gabitril [Tiagabine] Swelling   Lyrica [Pregabalin] Swelling   Neurontin [Gabapentin] Swelling   Nexium [Esomeprazole] Rash   Nsaids Rash   Shrimp [Shellfish Allergy] Anaphylaxis    Per patient "shrimp only"   Azactam [Aztreonam]     Hand swelling    Azelastine Hcl     Rash    Ciprofloxacin Other (See Comments)    dizziness   Claritin  [Loratadine]     Irritability Nervousness    Methotrexate Derivatives    Nasacort [Triamcinolone]     Dizzy    Olopatadine Other (See Comments)    Pain and lethargy    Other    Sulfamethizole Other (See Comments)    unknown   Zantac [Ranitidine Hcl] Other (See Comments)    unknown   Claritin-D 12 Hour [Loratadine-Pseudoephedrine Er] Anxiety   Keflex [Cephalexin] Nausea And Vomiting    Tolerated Ancef   Penicillins Rash    Injection site reaction. Tolerated cefepime in past. Also reports tolerating a penicillin infusion after this initial rxn ~20 yrs ago.  Has patient had a PCN reaction causing immediate rash, facial/tongue/throat swelling, SOB or lightheadedness with hypotension: No Has patient had a PCN reaction causing severe rash involving mucus membranes or skin necrosis: No Has patient had a PCN reaction that required hospitalization No Has patient had a PCN reaction occurring within the last 10 years: No     Sulfa Antibiotics Nausea And Vomiting    Family History  Problem Relation Age of Onset   Heart attack Maternal Grandmother    Heart attack Paternal Grandfather    Breast cancer Mother 35   Diabetes Father    Heart disease Father    Congestive Heart Failure Father 55       Died from   Allergic rhinitis Neg Hx    Asthma Neg Hx    Eczema Neg Hx    Urticaria Neg Hx     Prior to Admission medications   Medication Sig Start Date End Date Taking? Authorizing Provider  atorvastatin (LIPITOR) 20 MG tablet TAKE 1 TABLET BY MOUTH EVERY DAY 07/13/22  Yes Mast, [name], NP  Biotin 5 MG TBDP Take 1 tablet (5 mg total) by mouth daily. 06/20/22  Yes Medina-Vargas, Monina C, NP  Cholecalciferol (VITAMIN D3) 125 MCG (5000 UT) CAPS TAKE 1 CAPSULE BY MOUTH EVERY DAY 07/24/22  Yes Mast, [name], NP  colchicine 0.6 MG tablet TAKE 1 TABLET BY MOUTH EVERY DAY 08/28/22  Yes Mast, [name], NP  CRANBERRY PO Take 1 capsule by mouth daily.   Yes [provider]  cycloSPORINE (RESTASIS)  0.05 % ophthalmic emulsion Place 1 drop into both eyes 2 (two) times daily. 06/20/22  Yes Medina-Vargas, Monina C, NP  diclofenac Sodium (VOLTAREN) 1 % GEL APPLY 2 TO 4 GRAMS TOPICALLY TO AFFECTED JOINTS UP TO 4 TIMES DAILY Patient taking differently: Apply 2-4 g topically 4 (four) times daily as needed (pain). APPLY 2 TO 4 GRAMS TOPICALLY TO AFFECTED JOINTS UP TO 4 TIMES DAILY 06/20/22  Yes Medina-Vargas, Monina C, NP  fexofenadine (ALLEGRA) 180 MG tablet Take 1 tablet (180 mg total) by mouth daily. 06/20/22  Yes Medina-Vargas, Monina C, NP  furosemide (LASIX) 80 MG tablet Take 1 tablet (80 mg total) by mouth daily. 06/20/22  Yes Medina-Vargas, Monina C, NP  lansoprazole (PREVACID) 30 MG capsule TAKE 1 CAPSULE BY MOUTH ONCE DAILY  AT NOON Patient taking differently: Take 30 mg by mouth daily at 12 noon. 10/10/22  Yes Mast, [name], NP  montelukast (SINGULAIR) 10 MG tablet TAKE 1 TABLET BY MOUTH EVERYDAY AT BEDTIME Patient taking differently: Take 10 mg by mouth at bedtime. 08/28/22  Yes Mast, [name], NP  potassium chloride (MICRO-K) 10 MEQ CR capsule Take 2 capsules (20 mEq total) by mouth 2 (two) times daily. 06/20/22  Yes Medina-Vargas, Monina C, NP  predniSONE (DELTASONE) 10 MG tablet TAKE 1 TABLET (10 MG TOTAL) BY MOUTH DAILY WITH BREAKFAST. 10/27/22  Yes Mast, [name], NP  traMADol HCl 100 MG TABS Take 1 tablet by mouth once daily Patient taking differently: Take 100 mg by mouth daily. 10/09/22  Yes Mast, [name], NP  vitamin B-12 (CYANOCOBALAMIN) 1000 MCG tablet Take 1 tablet (1,000 mcg total) by mouth daily. 06/20/22  Yes Medina-Vargas, Monina C, NP  apixaban (ELIQUIS) 5 MG TABS tablet Take 1 tablet (5 mg total) by mouth 2 (two) times daily. Patient not taking: Reported on 10/27/2022 06/20/22   Medina-Vargas, Monina C, NP  ketoconazole (NIZORAL) 2 % shampoo Apply 1 Application topically once a week. Patient not taking: Reported on 10/27/2022 06/20/22   Medina-Vargas, Monina C, NP  traMADol (ULTRAM) 50 MG  tablet TAKE 1 TABLET BY MOUTH FOUR TIMES A DAY Patient not taking: Reported on 10/27/2022 09/25/22   Mast, [name], NP    Physical Exam: Vitals:   10/27/22 1215 10/27/22 1230 10/27/22 1245 10/27/22 1307  BP: (!) 117/50 (!) 110/48 (!) 118/56 (!) 104/59  Pulse: 91 86 85 82  Resp: (!) 27 (!) 27 (!) 21 19  Temp: (!) 101.4 F (38.6 C) (!) 101.2 F (38.4 C) (!) 101 F (38.3 C) (!) 100.8 F (38.2 C)  TempSrc:    Core  SpO2: 98% 99% 96% 98%   Physical Exam Constitutional:      Appearance: Normal appearance.  HENT:     Head: Normocephalic.     Nose: No rhinorrhea.     Mouth/Throat:     Mouth: Mucous membranes are dry.  Eyes:     General: No scleral icterus.    Pupils: Pupils are equal, round, and reactive to light.  Cardiovascular:     Rate and Rhythm: Normal rate and regular rhythm.  Pulmonary:     Effort: Pulmonary effort is normal.     Breath sounds: Normal breath sounds.  Abdominal:     General: Bowel sounds are normal. There is no distension.     Palpations: Abdomen is soft.     Tenderness: There is no abdominal tenderness. There is no guarding.  Musculoskeletal:     Cervical back: Neck supple.     Right lower leg: No edema.     Left lower leg: No edema.  Skin:    General: Skin is warm and dry.  Neurological:     General: No focal deficit present.     Mental Status: She is alert.     Comments: Oriented x2.  Partially oriented to time.  She knew the month and year, but did not remember the day of the week.  Psychiatric:        Mood and Affect: Mood normal.        Behavior: Behavior normal.   Data Reviewed:  There are no new results to review at this time.  Assessment and Plan: Principal Problem:   Sepsis secondary to UTI [facility]) History of multiple UTIs. Mostly E. coli related. Admit  to PCU/inpatient. Continue IV fluids. Continue ceftriaxone 2 g every 24 hours. Follow-up blood culture and sensitivity Follow-up urine culture and sensitivity. Follow CBC and CMP  in a.m.   Active Problems:   Diarrhea Check C. difficile colitis.    Rheumatoid arthritis (HCC) Solu-Cortef 100 mg today. Resume prednisone in AM.    GERD (gastroesophageal reflux disease) Continue lansoprazole formulary equivalent.    Pure hypercholesterolemia Continue atorvastatin 20 mg p.o. daily.    Asthma Supplemental oxygen as needed. Bronchodilators as needed.    DVT (deep venous thrombosis) (HCC) No longer apixaban. Daily SQ Lovenox.     Advance Care Planning:   Code Status: Partial Code   Consults:   Family Communication:   Severity of Illness: The appropriate patient status for this patient is INPATIENT. Inpatient status is judged to be reasonable and necessary in order to provide the required intensity of service to ensure the patient's safety. The patient's presenting symptoms, physical exam findings, and initial radiographic and laboratory data in the context of their chronic comorbidities is felt to place them at high risk for further clinical deterioration. Furthermore, it is not anticipated that the patient will be medically stable for discharge from the hospital within 2 midnights of admission.   * I certify that at the point of admission it is my clinical judgment that the patient will require inpatient hospital care spanning beyond 2 midnights from the point of admission due to high intensity of service, high risk for further deterioration and high frequency of surveillance required.*  Author: Reubin Milan, MD 10/27/2022 2:07 PM  For on call review [email or web address].   This document was prepared using Dragon voice recognition software and may contain some unintended transcription errors.

## 2022-10-27 NOTE — Progress Notes (Signed)
PHARMACY -  BRIEF ANTIBIOTIC NOTE   Pharmacy has received consult(s) for vancomycin and cefepime from an ED provider.  The patient's profile has been reviewed for ht/wt/allergies/indication/available labs.    One time order(s) placed for vancomycin 1 g + cefepime 2 g  Further antibiotics/pharmacy consults should be ordered by admitting physician if indicated.                       Thank you,  Tawnya Crook, PharmD, BCPS Clinical Pharmacist 10/27/2022 9:59 AM

## 2022-10-27 NOTE — ED Provider Notes (Signed)
[facility] DEPT Provider Note   CSN: [id number] Arrival date & time: 10/27/22  6761     History Chief Complaint  Patient presents with   Blood Infection    Eileen Wilkerson is a 88 y.o. female with history of hyperlipidemia, RA, for myalgia, UTI, urosepsis presents the emergency department for evaluation of altered mental status and febrile illness.  According to nursing note, patient was brought in by [facility] from [facility] home due to altered mental status, hypoxia, diarrhea, cough with thick green bloody sputum, and body aches.  Per nursing note, caregiver reports that the patient is generally talkative and however patient was found in bed and not gotten up all night.  Patient was covered in diarrhea at her place of residence with an SPO2 of 84% on room air with placed on a nonrebreather at 15 L.  Patient however is satting well on room air here although does have slight increased work of breathing.  History is limited due to patient condition.  She does awaken to voice and touch, however does not speak.  HPI     Home Medications Prior to Admission medications   Medication Sig Start Date End Date Taking? Authorizing Provider  atorvastatin (LIPITOR) 20 MG tablet TAKE 1 TABLET BY MOUTH EVERY DAY 07/13/22  Yes Mast, [name], NP  Biotin 5 MG TBDP Take 1 tablet (5 mg total) by mouth daily. 06/20/22  Yes Medina-Vargas, Monina C, NP  Cholecalciferol (VITAMIN D3) 125 MCG (5000 UT) CAPS TAKE 1 CAPSULE BY MOUTH EVERY DAY 07/24/22  Yes Mast, [name], NP  colchicine 0.6 MG tablet TAKE 1 TABLET BY MOUTH EVERY DAY 08/28/22  Yes Mast, [name], NP  CRANBERRY PO Take 1 capsule by mouth daily.   Yes [provider]  cycloSPORINE (RESTASIS) 0.05 % ophthalmic emulsion Place 1 drop into both eyes 2 (two) times daily. 06/20/22  Yes Medina-Vargas, Monina C, NP  diclofenac Sodium (VOLTAREN) 1 % GEL APPLY 2 TO 4 GRAMS TOPICALLY TO AFFECTED JOINTS UP TO 4 TIMES DAILY Patient  taking differently: Apply 2-4 g topically 4 (four) times daily as needed (pain). APPLY 2 TO 4 GRAMS TOPICALLY TO AFFECTED JOINTS UP TO 4 TIMES DAILY 06/20/22  Yes Medina-Vargas, Monina C, NP  fexofenadine (ALLEGRA) 180 MG tablet Take 1 tablet (180 mg total) by mouth daily. 06/20/22  Yes Medina-Vargas, Monina C, NP  furosemide (LASIX) 80 MG tablet Take 1 tablet (80 mg total) by mouth daily. 06/20/22  Yes Medina-Vargas, Monina C, NP  lansoprazole (PREVACID) 30 MG capsule TAKE 1 CAPSULE BY MOUTH ONCE DAILY AT NOON Patient taking differently: Take 30 mg by mouth daily at 12 noon. 10/10/22  Yes Mast, [name], NP  Melatonin 10 MG TABS Take 10 mg by mouth at bedtime.   Yes [provider]  montelukast (SINGULAIR) 10 MG tablet TAKE 1 TABLET BY MOUTH EVERYDAY AT BEDTIME Patient taking differently: Take 10 mg by mouth at bedtime. 08/28/22  Yes Mast, [name], NP  potassium chloride (MICRO-K) 10 MEQ CR capsule Take 2 capsules (20 mEq total) by mouth 2 (two) times daily. 06/20/22  Yes Medina-Vargas, Monina C, NP  predniSONE (DELTASONE) 10 MG tablet TAKE 1 TABLET (10 MG TOTAL) BY MOUTH DAILY WITH BREAKFAST. 10/27/22  Yes Mast, [name], NP  traMADol HCl 100 MG TABS Take 1 tablet by mouth once daily Patient taking differently: Take 100 mg by mouth daily. 10/09/22  Yes Mast, [name], NP  vitamin B-12 (CYANOCOBALAMIN)  1000 MCG tablet Take 1 tablet (1,000 mcg total) by mouth daily. 06/20/22  Yes Medina-Vargas, Monina C, NP  apixaban (ELIQUIS) 5 MG TABS tablet Take 1 tablet (5 mg total) by mouth 2 (two) times daily. Patient not taking: Reported on 10/27/2022 06/20/22   Medina-Vargas, Monina C, NP  ketoconazole (NIZORAL) 2 % shampoo Apply 1 Application topically once a week. Patient not taking: Reported on 10/27/2022 06/20/22   Medina-Vargas, Monina C, NP  traMADol (ULTRAM) 50 MG tablet TAKE 1 TABLET BY MOUTH FOUR TIMES A DAY Patient not taking: Reported on 10/27/2022 09/25/22   Mast, [name], NP      Allergies    Adhesive  [tape], Celebrex [celecoxib], Ciprofibrate, Cymbalta [duloxetine hcl], Gabitril [tiagabine], Lyrica [pregabalin], Neurontin [gabapentin], Nexium [esomeprazole], Nsaids, Shrimp [shellfish allergy], Azactam [aztreonam], Azelastine hcl, Ciprofloxacin, Claritin [loratadine], Methotrexate derivatives, Nasacort [triamcinolone], Olopatadine, Other, Sulfamethizole, Zantac [ranitidine hcl], Claritin-d 12 hour [loratadine-pseudoephedrine er], Keflex [cephalexin], Penicillins, and Sulfa antibiotics    Review of Systems   Review of Systems  Unable to perform ROS: Acuity of condition    Physical Exam Updated Vital Signs BP (!) 104/59 (BP Location: Right Arm)   Pulse 82   Temp (!) 100.8 F (38.2 C) (Core)   Resp 19   SpO2 98%  Physical Exam Vitals and nursing note reviewed.  Constitutional:      Comments: Somnolent, but awakens to voice and touch  HENT:     Head: Normocephalic and atraumatic.     Mouth/Throat:     Mouth: Mucous membranes are dry.  Eyes:     General: No scleral icterus.    Pupils: Pupils are equal, round, and reactive to light.  Cardiovascular:     Rate and Rhythm: Regular rhythm. Tachycardia present.  Pulmonary:     Breath sounds: Normal breath sounds.     Comments: Tachypnea however breath sounds are normal.  No respiratory distress or accessory muscle use. Abdominal:     General: Bowel sounds are normal. There is no distension.     Palpations: Abdomen is soft.     Tenderness: There is no abdominal tenderness. There is no guarding or rebound.  Musculoskeletal:        General: No deformity.  Skin:    General: Skin is warm and dry.     ED Results / Procedures / Treatments   Labs (all labs ordered are listed, but only abnormal results are displayed) Labs Reviewed  COMPREHENSIVE METABOLIC PANEL - Abnormal; Notable for the following components:      Result Value   Potassium 3.3 (*)    Glucose, Bld 129 (*)    Creatinine, Ser 1.17 (*)    Total Bilirubin 1.4 (*)     GFR, Estimated 45 (*)    All other components within normal limits  LACTIC ACID, PLASMA - Abnormal; Notable for the following components:   Lactic Acid, Venous 2.4 (*)    All other components within normal limits  LACTIC ACID, PLASMA - Abnormal; Notable for the following components:   Lactic Acid, Venous 3.2 (*)    All other components within normal limits  CBC WITH DIFFERENTIAL/PLATELET - Abnormal; Notable for the following components:   WBC 18.6 (*)    Neutro Abs 12.7 (*)    Monocytes Absolute 2.1 (*)    Abs Immature Granulocytes 0.12 (*)    All other components within normal limits  URINALYSIS, ROUTINE W REFLEX MICROSCOPIC - Abnormal; Notable for the following components:   APPearance CLOUDY (*)  Specific Gravity, Urine <1.005 (*)    pH >9.0 (*)    Hgb urine dipstick MODERATE (*)    Protein, ur 100 (*)    Nitrite POSITIVE (*)    Leukocytes,Ua MODERATE (*)    All other components within normal limits  URINALYSIS, MICROSCOPIC (REFLEX) - Abnormal; Notable for the following components:   Bacteria, UA MANY (*)    All other components within normal limits  TROPONIN I (HIGH SENSITIVITY) - Abnormal; Notable for the following components:   Troponin I (High Sensitivity) 33 (*)    All other components within normal limits  RESP PANEL BY RT-PCR (FLU A&B, COVID) ARPGX2  CULTURE, BLOOD (ROUTINE X 2)  CULTURE, BLOOD (ROUTINE X 2)  URINE CULTURE  PROTIME-INR  APTT  TROPONIN I (HIGH SENSITIVITY)    EKG EKG Interpretation  Date/Time:  Friday October 27 2022 09:39:27 EST Ventricular Rate:  113 PR Interval:  174 QRS Duration: 118 QT Interval:  332 QTC Calculation: 456 R Axis:   -51 Text Interpretation: Ectopic atrial tachycardia, unifocal Right bundle branch block LVH with IVCD and secondary repol abnrm Confirmed by Dene Gentry [phone]) on 10/27/2022 10:04:05 AM  Radiology DG Chest 2 View  Result Date: 10/27/2022 CLINICAL DATA:  Altered mental status. Hypoxia. Productive  cough. Suspected sepsis. EXAM: CHEST - 2 VIEW COMPARISON:  05/21/2022 FINDINGS: The heart size and mediastinal contours are within normal limits. Mild elevation of left hemidiaphragm is stable. Both lungs are clear. Cervical spine fusion hardware again noted. IMPRESSION: Stable exam. No active cardiopulmonary disease. Electronically Signed   By: Marlaine Hind M.D.   On: 10/27/2022 10:42    Procedures .Critical Care  Performed by: Sherrell Puller, PA-C Authorized by: Sherrell Puller, PA-C   Critical care provider statement:    Critical care time (minutes):  30   Critical care was necessary to treat or prevent imminent or life-threatening deterioration of the following conditions:  Sepsis   Critical care was time spent personally by me on the following activities:  Development of treatment plan with patient or surrogate, discussions with consultants, evaluation of patient's response to treatment, examination of patient, ordering and review of laboratory studies, ordering and review of radiographic studies, ordering and performing treatments and interventions, pulse oximetry, re-evaluation of patient's condition and review of old charts   Care discussed with: admitting provider      Medications Ordered in ED Medications  lactated ringers infusion ( Intravenous New Bag/Given 10/27/22 1309)  cefTRIAXone (ROCEPHIN) 2 g in sodium chloride 0.9 % 100 mL IVPB (has no administration in time range)  acetaminophen (TYLENOL) tablet 650 mg (has no administration in time range)    Or  acetaminophen (TYLENOL) suppository 650 mg (has no administration in time range)  ondansetron (ZOFRAN) tablet 4 mg (has no administration in time range)    Or  ondansetron (ZOFRAN) injection 4 mg (has no administration in time range)  acetaminophen (TYLENOL) suppository 650 mg (650 mg Rectal Given 10/27/22 0949)  lactated ringers bolus 1,000 mL (0 mLs Intravenous Stopped 10/27/22 1131)    And  lactated ringers bolus 1,000 mL (0  mLs Intravenous Stopped 10/27/22 1234)    And  lactated ringers bolus 500 mL (0 mLs Intravenous Stopped 10/27/22 1306)  ceFEPIme (MAXIPIME) 2 g in sodium chloride 0.9 % 100 mL IVPB (0 g Intravenous Stopped 10/27/22 1058)  metroNIDAZOLE (FLAGYL) IVPB 500 mg (0 mg Intravenous Stopped 10/27/22 1128)  vancomycin (VANCOCIN) IVPB 1000 mg/200 mL premix (0 mg Intravenous Stopped 10/27/22 1234)  ED Course/ Medical Decision Making/ A&P                           Medical Decision Making Amount and/or Complexity of Data Reviewed Labs: ordered. Radiology: ordered. ECG/medicine tests: ordered.  Risk OTC drugs. Prescription drug management. Decision regarding hospitalization.    88 year old female presents emerged department for evaluation of fever and altered mental status.  Differential diagnosis includes is limited to sepsis, pneumonia, UTI, stroke.  Vital signs show blood pressure at 127/67, respiratory rate of 30, satting 94% on room air with a fever of 103.7.  Apparently initially, the patient was hypoxic with EMS, however has not been hypoxic while here.  Physical exam as noted above.  Due to obvious sepsis, sepsis bundle was ordered.  I called and spoke with pharmacist Hildred Alamin over at [facility] emergency department pharmacist for medications given the patient's 24 allergies.  We settled on cefepime, Flagyl, and vancomycin for the patient's symptoms.  Fluid bolus ordered as well.  Last echo was performed in June that showed an EF of 60 to 65%.  PR Tylenol ordered.   I independently reviewed and interpreted the patient's labs and imaging.  CMP shows mildly decreased potassium 3.3.  Glucose at 129.  Creatinine at 1.17.  Increase total bili at 1.4.  Fattening is higher than patient's baseline around 5 months ago.  Normal PT INR and APTT.  Negative for COVID and flu.  Troponin pending.  Urinalysis shows cloudy, dilute urine that is basic.  There is 100 protein, nitrate positive, moderate leukocytes  sites with 6-10 red blood cells, 1120 white blood cells and many bacteria.  This is performed within and outs was less likely contamination.  Patient has history of urosepsis, likely the source of her infection today.  Chest x-ray shows no acute cardiopulmonary process.  EKG read and interpreted by my attending as ectopic atrial tachycardia, unifocal Right bundle branch block LVH with IVCD and secondary repol abnrm.   Patient is not having abdominal tenderness to palpation.  Heart is tachycardic, lung sounds are clear.  Likely urosepsis.  We will leave any additional imaging decisions to the admitting provider.  Admit to Dr. Olevia Bowens.  I discussed this case with my attending physician who cosigned this note including patient's presenting symptoms, physical exam, and planned diagnostics and interventions. Attending physician stated agreement with plan or made changes to plan which were implemented.   Attending physician assessed patient at bedside.  Final Clinical Impression(s) / ED Diagnoses Final diagnoses:  Sepsis, due to unspecified organism, unspecified whether acute organ dysfunction present [facility])  Acute cystitis with hematuria    Rx / DC Orders ED Discharge Orders     None         Sherrell Puller, PA-C 10/27/22 1626    Valarie Merino, MD 11/05/22 1843

## 2022-10-27 NOTE — ED Triage Notes (Signed)
Pt bib [facility] from [facility] d/t AMS, hypoxia, diarrhea, cough w/ thick green bloody sputum and body aches. Care giver reports pt generally talkative and up at lib however pt found in her bed had not gotten up all night. Pt covered in diarrhea w/ spo2 of 84% on RA. Pt placed on non rebreather '@15L'$ .

## 2022-10-28 ENCOUNTER — Inpatient Hospital Stay (HOSPITAL_COMMUNITY): Payer: Medicare PPO

## 2022-10-28 DIAGNOSIS — M0579 Rheumatoid arthritis with rheumatoid factor of multiple sites without organ or systems involvement: Secondary | ICD-10-CM | POA: Diagnosis not present

## 2022-10-28 DIAGNOSIS — R197 Diarrhea, unspecified: Secondary | ICD-10-CM

## 2022-10-28 DIAGNOSIS — R52 Pain, unspecified: Secondary | ICD-10-CM | POA: Diagnosis not present

## 2022-10-28 DIAGNOSIS — K219 Gastro-esophageal reflux disease without esophagitis: Secondary | ICD-10-CM | POA: Diagnosis not present

## 2022-10-28 DIAGNOSIS — M79661 Pain in right lower leg: Secondary | ICD-10-CM

## 2022-10-28 DIAGNOSIS — A419 Sepsis, unspecified organism: Secondary | ICD-10-CM | POA: Diagnosis not present

## 2022-10-28 DIAGNOSIS — Z86718 Personal history of other venous thrombosis and embolism: Secondary | ICD-10-CM

## 2022-10-28 LAB — COMPREHENSIVE METABOLIC PANEL
ALT: 14 U/L (ref 0–44)
AST: 19 U/L (ref 15–41)
Albumin: 2.7 g/dL — ABNORMAL LOW (ref 3.5–5.0)
Alkaline Phosphatase: 47 U/L (ref 38–126)
Anion gap: 7 (ref 5–15)
BUN: 10 mg/dL (ref 8–23)
CO2: 27 mmol/L (ref 22–32)
Calcium: 8.9 mg/dL (ref 8.9–10.3)
Chloride: 109 mmol/L (ref 98–111)
Creatinine, Ser: 1.05 mg/dL — ABNORMAL HIGH (ref 0.44–1.00)
GFR, Estimated: 51 mL/min — ABNORMAL LOW (ref >60)
Glucose, Bld: 152 mg/dL — ABNORMAL HIGH (ref 70–99)
Potassium: 3.3 mmol/L — ABNORMAL LOW (ref 3.5–5.1)
Sodium: 143 mmol/L (ref 135–145)
Total Bilirubin: 1.1 mg/dL (ref 0.3–1.2)
Total Protein: 6.3 g/dL — ABNORMAL LOW (ref 6.5–8.1)

## 2022-10-28 LAB — CBC WITH DIFFERENTIAL/PLATELET
Abs Immature Granulocytes: 0.08 10*3/uL — ABNORMAL HIGH (ref 0.00–0.07)
Basophils Absolute: 0 10*3/uL (ref 0.0–0.1)
Basophils Relative: 0 %
Eosinophils Absolute: 0 10*3/uL (ref 0.0–0.5)
Eosinophils Relative: 0 %
HCT: 35.8 % — ABNORMAL LOW (ref 36.0–46.0)
Hemoglobin: 10.9 g/dL — ABNORMAL LOW (ref 12.0–15.0)
Immature Granulocytes: 1 %
Lymphocytes Relative: 7 %
Lymphs Abs: 1.2 10*3/uL (ref 0.7–4.0)
MCH: 30 pg (ref 26.0–34.0)
MCHC: 30.4 g/dL (ref 30.0–36.0)
MCV: 98.6 fL (ref 80.0–100.0)
Monocytes Absolute: 1.1 10*3/uL — ABNORMAL HIGH (ref 0.1–1.0)
Monocytes Relative: 6 %
Neutro Abs: 14.6 10*3/uL — ABNORMAL HIGH (ref 1.7–7.7)
Neutrophils Relative %: 86 %
Platelets: 240 10*3/uL (ref 150–400)
RBC: 3.63 MIL/uL — ABNORMAL LOW (ref 3.87–5.11)
RDW: 14.1 % (ref 11.5–15.5)
WBC: 17.1 10*3/uL — ABNORMAL HIGH (ref 4.0–10.5)
nRBC: 0 % (ref 0.0–0.2)

## 2022-10-28 LAB — C DIFFICILE QUICK SCREEN W PCR REFLEX
C Diff antigen: NEGATIVE
C Diff interpretation: NOT DETECTED
C Diff toxin: NEGATIVE

## 2022-10-28 LAB — PROCALCITONIN: Procalcitonin: 3.13 ng/mL

## 2022-10-28 NOTE — Progress Notes (Signed)
PROGRESS NOTE    Eileen Wilkerson  FXT:[id number] DOB: [birth year] DOA: 10/27/2022 PCP: Mast, [name], NP   Brief Narrative: Eileen Wilkerson is a 88 y.o. female with a history of adrenal failure, rheumatoid arthritis on prednisone, asthma, diverticulosis, diverticulitis, recurrent UTI, E. coli bacteremia, fibromyalgia, GERD, HCAP, hyperlipidemia, osteoporosis, recurrent URI.  Patient presented secondary to decreased mentation with associated productive cough and fever.  When EMS arrived, patient was found to have hypoxia requiring administration of nonrebreather at 15 L/min.  On presentation to the emergency department, patient was found to have evidence of sepsis with concern for UTI source.  IV fluids and empiric antibiotics initiated on admission.  Chest imaging without evidence for acute infection.  Urine culture consistent with UTI.   Assessment and Plan:  Severe sepsis Present on admission. Associated lactic acid of 2.4 with a peak of 3.2 which is now resolved. UTI source. Empiric Ceftriaxone started on admission. IV fluids resuscitation given while in the ED.  Sepsis physiology appears to be improved.  UTI Urinalysis suggestive of likely UTI. Urine culture (catheterized) obtained on 11/17. Empiric Ceftriaxone started on admission.  Preliminary urine culture significant for 50,000 colonies of gram-negative rods. -Continue Ceftriaxone IV -Follow-up urine culture (11/17)  Diarrhea Unclear etiology.  Concern for possible C. difficile.  Patient with continued diarrheal symptoms while inpatient. -Follow-up C. difficile testing  Rheumatoid arthritis Patient is on prednisone 10 mg daily as an outpatient. -Continue prednisone 10 mg daily  GERD -Continue Protonix  Hyperlipidemia -Continue Lipitor  Productive cough Appears patient has a history of recurrent upper respiratory infections.  No evidence of pneumonia on imaging. -Obtain sputum culture  Asthma -Continue Duonebs as  needed.  History of DVT Not on chronic anticoagulation.  Patient was previously on Eliquis.  Daughter is concerned about recurrent DVT.  Patient reports some right calf pain. -Lower extremity venous duplex   DVT prophylaxis: Lovenox Code Status:   Code Status: Partial Code Family Communication: Daughter at bedside Disposition Plan: Patient is from independent living at [facility]. Discharge pending culture data and transition to outpatient antibiotic regimen.  Concern patient may need SNF on discharge so we will obtain PT/OT recommendations   Consultants:  None  Procedures:  None  Antimicrobials: Ceftriaxone IV    Subjective: Patient reports no issues this morning.  Hungry.  Was hoping for breakfast.  Per daughter, patient has not mentally at baseline.  Objective: BP 128/64 (BP Location: Left Arm)   Pulse 83   Temp 98.9 F (37.2 C) (Oral)   Resp 18   SpO2 100%   Examination:  General exam: Appears calm and comfortable Respiratory system: Clear to auscultation. Respiratory effort normal. Cardiovascular system: S1 & S2 heard, RRR. Gastrointestinal system: Abdomen is nondistended, soft and nontender. No organomegaly or masses felt. Normal bowel sounds heard. Central nervous system: Slightly somnolent but arouses easily and is oriented. No focal neurological deficits. Musculoskeletal: No edema. No calf tenderness Skin: No cyanosis. No rashes Psychiatry: Short-term memory slightly impaired   Data Reviewed: I have personally reviewed following labs and imaging studies  CBC Lab Results  Component Value Date   WBC 17.1 (H) 10/28/2022   RBC 3.63 (L) 10/28/2022   HGB 10.9 (L) 10/28/2022   HCT 35.8 (L) 10/28/2022   MCV 98.6 10/28/2022   MCH 30.0 10/28/2022   PLT 240 10/28/2022   MCHC 30.4 10/28/2022   RDW 14.1 10/28/2022   LYMPHSABS 1.2 10/28/2022   MONOABS 1.1 (H) 10/28/2022   EOSABS 0.0  10/28/2022   BASOSABS 0.0 46/50/3546     Last metabolic panel Lab  Results  Component Value Date   NA 143 10/28/2022   K 3.3 (L) 10/28/2022   CL 109 10/28/2022   CO2 27 10/28/2022   BUN 10 10/28/2022   CREATININE 1.05 (H) 10/28/2022   GLUCOSE 152 (H) 10/28/2022   GFRNONAA 51 (L) 10/28/2022   GFRAA 56 (L) 02/03/2021   CALCIUM 8.9 10/28/2022   PHOS 2.8 06/03/2019   PROT 6.3 (L) 10/28/2022   ALBUMIN 2.7 (L) 10/28/2022   LABGLOB 3.2 08/08/2018   AGRATIO 1.4 08/08/2018   BILITOT 1.1 10/28/2022   ALKPHOS 47 10/28/2022   AST 19 10/28/2022   ALT 14 10/28/2022   ANIONGAP 7 10/28/2022    GFR: CrCl cannot be calculated (Unknown ideal weight.).  Recent Results (from the past 240 hour(s))  Culture, blood (Routine x 2)     Status: None (Preliminary result)   Collection Time: 10/27/22  9:45 AM   Specimen: BLOOD  Result Value Ref Range Status   Specimen Description   Final    BLOOD RIGHT ANTECUBITAL Performed at [facility] [address], [place], [place] [address]    Special Requests   Final    BOTTLES DRAWN AEROBIC AND ANAEROBIC Blood Culture results may not be optimal due to an excessive volume of blood received in culture bottles Performed at [facility] [address] [place], [place] [address]    Culture   Final    NO GROWTH < 24 HOURS Performed at [facility] [address], [place], [place] [address]    Report Status PENDING  Incomplete  Resp Panel by RT-PCR (Flu A&B, Covid) Anterior Nasal Swab     Status: None   Collection Time: 10/27/22  9:51 AM   Specimen: Anterior Nasal Swab  Result Value Ref Range Status   SARS Coronavirus 2 by RT PCR NEGATIVE NEGATIVE Final    Comment: (NOTE) SARS-CoV-2 target nucleic acids are NOT DETECTED.  The SARS-CoV-2 RNA is generally detectable in upper respiratory specimens during the acute phase of infection. The lowest concentration of SARS-CoV-2 viral copies this assay can detect is 138 copies/mL. A negative result does not preclude  SARS-Cov-2 infection and should not be used as the sole basis for treatment or other patient management decisions. A negative result may occur with  improper specimen collection/handling, submission of specimen other than nasopharyngeal swab, presence of viral mutation(s) within the areas targeted by this assay, and inadequate number of viral copies(<138 copies/mL). A negative result must be combined with clinical observations, patient history, and epidemiological information. The expected result is Negative.  Fact Sheet for Patients:  EntrepreneurPulse.com.au  Fact Sheet for Healthcare Providers:  IncredibleEmployment.be  This test is no t yet approved or cleared by the [place] FDA and  has been authorized for detection and/or diagnosis of SARS-CoV-2 by FDA under an Emergency Use Authorization (EUA). This EUA will remain  in effect (meaning this test can be used) for the duration of the COVID-19 declaration under Section 564(b)(1) of the Act, 21 U.S.C.section 360bbb-3(b)(1), unless the authorization is terminated  or revoked sooner.       Influenza A by PCR NEGATIVE NEGATIVE Final   Influenza B by PCR NEGATIVE NEGATIVE Final    Comment: (NOTE) The Xpert Xpress SARS-CoV-2/FLU/RSV plus assay is intended as an aid in the diagnosis of influenza from Nasopharyngeal swab specimens and should not be used as a sole basis  for treatment. Nasal washings and aspirates are unacceptable for Xpert Xpress SARS-CoV-2/FLU/RSV testing.  Fact Sheet for Patients: EntrepreneurPulse.com.au  Fact Sheet for Healthcare Providers: IncredibleEmployment.be  This test is not yet approved or cleared by the [place] FDA and has been authorized for detection and/or diagnosis of SARS-CoV-2 by FDA under an Emergency Use Authorization (EUA). This EUA will remain in effect (meaning this test can be used) for the duration of  the COVID-19 declaration under Section 564(b)(1) of the Act, 21 U.S.C. section 360bbb-3(b)(1), unless the authorization is terminated or revoked.  Performed at [facility] [employer] [address], [place], [place] [address]   Culture, blood (Routine x 2)     Status: None (Preliminary result)   Collection Time: 10/27/22  9:55 AM   Specimen: BLOOD LEFT HAND  Result Value Ref Range Status   Specimen Description   Final    BLOOD LEFT HAND Performed at [facility] [address] [place], [place] [address]    Special Requests   Final    BOTTLES DRAWN AEROBIC AND ANAEROBIC Blood Culture results may not be optimal due to an inadequate volume of blood received in culture bottles Performed at [facility] [address] [place], [place] [address]    Culture   Final    NO GROWTH < 24 HOURS Performed at [facility] [address], [place], [place] [address]    Report Status PENDING  Incomplete      Radiology Studies: DG Chest 2 View  Result Date: 10/27/2022 CLINICAL DATA:  Altered mental status. Hypoxia. Productive cough. Suspected sepsis. EXAM: CHEST - 2 VIEW COMPARISON:  05/21/2022 FINDINGS: The heart size and mediastinal contours are within normal limits. Mild elevation of left hemidiaphragm is stable. Both lungs are clear. Cervical spine fusion hardware again noted. IMPRESSION: Stable exam. No active cardiopulmonary disease. Electronically Signed   By: Marlaine Hind M.D.   On: 10/27/2022 10:42      LOS: 1 day    Cordelia Poche, MD [facility] 10/28/2022, 8:28 AM   If 7PM-7AM, please contact night-coverage [email or web address]

## 2022-10-28 NOTE — Hospital Course (Signed)
Eileen Wilkerson is a 88 y.o. female with a history of adrenal failure, rheumatoid arthritis on prednisone, asthma, diverticulosis, diverticulitis, recurrent UTI, E. coli bacteremia, fibromyalgia, GERD, HCAP, hyperlipidemia, osteoporosis, recurrent URI.  Patient presented secondary to decreased mentation with associated productive cough and fever.  When EMS arrived, patient was found to have hypoxia requiring administration of nonrebreather at 15 L/min.  On presentation to the emergency department, patient was found to have evidence of sepsis with concern for UTI source.  IV fluids and empiric antibiotics initiated on admission.  Chest imaging without evidence for acute infection.  Urine culture consistent with UTI.

## 2022-10-28 NOTE — Progress Notes (Signed)
BLE venous duplex has been completed.   Results can be found under chart review under CV PROC. 10/28/2022 2:11 PM Magdelene Ruark RVT, RDMS

## 2022-10-29 DIAGNOSIS — M0579 Rheumatoid arthritis with rheumatoid factor of multiple sites without organ or systems involvement: Secondary | ICD-10-CM | POA: Diagnosis not present

## 2022-10-29 DIAGNOSIS — A419 Sepsis, unspecified organism: Secondary | ICD-10-CM | POA: Diagnosis not present

## 2022-10-29 DIAGNOSIS — K219 Gastro-esophageal reflux disease without esophagitis: Secondary | ICD-10-CM | POA: Diagnosis not present

## 2022-10-29 DIAGNOSIS — R197 Diarrhea, unspecified: Secondary | ICD-10-CM | POA: Diagnosis not present

## 2022-10-29 LAB — CBC WITH DIFFERENTIAL/PLATELET
Abs Immature Granulocytes: 0.05 10*3/uL (ref 0.00–0.07)
Basophils Absolute: 0 10*3/uL (ref 0.0–0.1)
Basophils Relative: 0 %
Eosinophils Absolute: 0.1 10*3/uL (ref 0.0–0.5)
Eosinophils Relative: 1 %
HCT: 32.8 % — ABNORMAL LOW (ref 36.0–46.0)
Hemoglobin: 9.8 g/dL — ABNORMAL LOW (ref 12.0–15.0)
Immature Granulocytes: 0 %
Lymphocytes Relative: 16 %
Lymphs Abs: 2.2 10*3/uL (ref 0.7–4.0)
MCH: 29.8 pg (ref 26.0–34.0)
MCHC: 29.9 g/dL — ABNORMAL LOW (ref 30.0–36.0)
MCV: 99.7 fL (ref 80.0–100.0)
Monocytes Absolute: 1 10*3/uL (ref 0.1–1.0)
Monocytes Relative: 8 %
Neutro Abs: 10.1 10*3/uL — ABNORMAL HIGH (ref 1.7–7.7)
Neutrophils Relative %: 75 %
Platelets: 221 10*3/uL (ref 150–400)
RBC: 3.29 MIL/uL — ABNORMAL LOW (ref 3.87–5.11)
RDW: 14 % (ref 11.5–15.5)
WBC: 13.6 10*3/uL — ABNORMAL HIGH (ref 4.0–10.5)
nRBC: 0 % (ref 0.0–0.2)

## 2022-10-29 LAB — BASIC METABOLIC PANEL
Anion gap: 9 (ref 5–15)
BUN: 13 mg/dL (ref 8–23)
CO2: 24 mmol/L (ref 22–32)
Calcium: 8.8 mg/dL — ABNORMAL LOW (ref 8.9–10.3)
Chloride: 111 mmol/L (ref 98–111)
Creatinine, Ser: 0.84 mg/dL (ref 0.44–1.00)
GFR, Estimated: >60 mL/min (ref >60)
Glucose, Bld: 103 mg/dL — ABNORMAL HIGH (ref 70–99)
Potassium: 2.9 mmol/L — ABNORMAL LOW (ref 3.5–5.1)
Sodium: 144 mmol/L (ref 135–145)

## 2022-10-29 LAB — URINE CULTURE: Culture: 50000 — AB

## 2022-10-29 LAB — PROCALCITONIN: Procalcitonin: 2.43 ng/mL

## 2022-10-29 MED ORDER — POTASSIUM CHLORIDE CRYS ER 20 MEQ PO TBCR
40.0000 meq | EXTENDED_RELEASE_TABLET | Freq: Once | ORAL | Status: AC
  Administered 2022-10-29: 40 meq via ORAL
  Filled 2022-10-29: qty 2

## 2022-10-29 MED ORDER — COLCHICINE 0.3 MG HALF TABLET
0.3000 mg | ORAL_TABLET | Freq: Two times a day (BID) | ORAL | Status: DC
  Administered 2022-10-30 – 2022-11-01 (×5): 0.3 mg via ORAL
  Filled 2022-10-29 (×6): qty 1

## 2022-10-29 MED ORDER — COLCHICINE 0.6 MG PO TABS
0.6000 mg | ORAL_TABLET | Freq: Once | ORAL | Status: AC
  Administered 2022-10-29: 0.6 mg via ORAL
  Filled 2022-10-29: qty 1

## 2022-10-29 MED ORDER — CHLORHEXIDINE GLUCONATE CLOTH 2 % EX PADS
6.0000 | MEDICATED_PAD | Freq: Every day | CUTANEOUS | Status: DC
  Administered 2022-10-29 – 2022-10-30 (×2): 6 via TOPICAL

## 2022-10-29 NOTE — TOC Progression Note (Signed)
Transition of Care [facility]) - Progression Note    Patient Details  Name: Eileen Wilkerson MRN: [id number] Date of Birth: Jun 01, 1932  Transition of Care [facility]) CM/SW Contact  Henrietta Dine, RN Phone Number: 10/29/2022, 3:11 PM  Clinical Narrative:    Pt from [place]; LVM for [place] @ [facility] [phone]); attempted to verify residency and provide notification of PT recc for SNF.        Expected Discharge Plan and Services                                                 Social Determinants of Health (SDOH) Interventions    Readmission Risk Interventions    05/30/2022   10:08 AM  Readmission Risk Prevention Plan  Transportation Screening Complete  PCP or Specialist Appt within 5-7 Days Complete  Home Care Screening Complete  Medication Review (RN CM) Complete

## 2022-10-29 NOTE — Evaluation (Signed)
Physical Therapy Evaluation Patient Details Name: Eileen Wilkerson MRN: [id number] DOB: [birth year] Today's Date: 10/29/2022  History of Present Illness  Patient admitted with sepsis secondary to UTI. Eileen Wilkerson is a 88 y.o. female with a history of adrenal failure, rheumatoid arthritis on prednisone, asthma, diverticulosis, diverticulitis, recurrent UTI, E. coli bacteremia, fibromyalgia, GERD, HCAP, hyperlipidemia, osteoporosis, recurrent URI.  Patient presented secondary to decreased mentation with associated productive cough and fever.  When EMS arrived, patient was found to have hypoxia requiring administration of nonrebreather at 15 L/min.  On presentation to the emergency department, patient was found to have evidence of sepsis with concern for UTI source.  Clinical Impression  Pt admitted with above diagnosis.  Pt from [place] at [facility]. Pt limited by pain/discomfort bil feet this date. Pt is agreeable to SNF as long as she can go to [place] for her rehab.   Pt currently with functional limitations due to the deficits listed below (see PT Problem List). Pt will benefit from skilled PT to increase their independence and safety with mobility to allow discharge to the venue listed below.          Recommendations for follow up therapy are one component of a multi-disciplinary discharge planning process, led by the attending physician.  Recommendations may be updated based on patient status, additional functional criteria and insurance authorization.  Follow Up Recommendations Skilled nursing-short term rehab (<3 hours/day)      Assistance Recommended at Discharge Intermittent Supervision/Assistance  Patient can return home with the following  A little help with walking and/or transfers;A little help with bathing/dressing/bathroom;Assist for transportation;Help with stairs or ramp for entrance;Assistance with cooking/housework    Equipment Recommendations None recommended by PT   Recommendations for Other Services       Functional Status Assessment Patient has had a recent decline in their functional status and demonstrates the ability to make significant improvements in function in a reasonable and predictable amount of time.     Precautions / Restrictions Precautions Precautions: Fall Precaution Comments: painful feet (gout?) Restrictions Weight Bearing Restrictions: No      Mobility  Bed Mobility Overal bed mobility: Needs Assistance Bed Mobility: Sit to Supine       Sit to supine: Min assist   General bed mobility comments: assist with LEs    Transfers Overall transfer level: Needs assistance Equipment used: Rolling walker (2 wheels) Transfers: Sit to/from Stand Sit to Stand: Min assist   Step pivot transfers: Min assist, Min guard       General transfer comment: intermittent assist to steady    Ambulation/Gait                  Stairs            Wheelchair Mobility    Modified Rankin (Stroke Patients Only)       Balance Overall balance assessment: Needs assistance Sitting-balance support: No upper extremity supported, Feet supported Sitting balance-Leahy Scale: Good     Standing balance support: During functional activity, Reliant on assistive device for balance Standing balance-Leahy Scale: Poor                               Pertinent Vitals/Pain Pain Assessment Pain Assessment: Faces Pain Location: bilateral feet Pain Descriptors / Indicators: Aching Pain Intervention(s): Limited activity within patient's tolerance, Monitored during session, Repositioned    Home Living Family/patient expects to be discharged to:: Private residence Living Arrangements:  Alone Available Help at Discharge: Family;Personal care attendant;Available PRN/intermittently Type of Home: Independent living facility Home Access: Level entry       Home Layout: One level Home Equipment: Rollator (4  wheels);Wheelchair - manual Additional Comments: [facility] I-living    Prior Function Prior Level of Function : Independent/Modified Independent             Mobility Comments: uses rollator for household distances, wheelchair for longer distances ADLs Comments: performs ADLs unassisted. help for homemaking, meals, transportation     Hand Dominance   Dominant Hand: Right    Extremity/Trunk Assessment   Upper Extremity Assessment Upper Extremity Assessment: Overall WFL for tasks assessed    Lower Extremity Assessment Lower Extremity Assessment: Generalized weakness    Cervical / Trunk Assessment Cervical / Trunk Assessment: Kyphotic  Communication   Communication: HOH  Cognition Arousal/Alertness: Awake/alert Behavior During Therapy: WFL for tasks assessed/performed Overall Cognitive Status: Within Functional Limits for tasks assessed                                          General Comments      Exercises General Exercises - Lower Extremity Ankle Circles/Pumps: AROM, Both, 10 reps   Assessment/Plan    PT Assessment Patient needs continued PT services  PT Problem List Decreased strength;Decreased range of motion;Decreased activity tolerance;Decreased balance;Decreased mobility;Decreased knowledge of precautions       PT Treatment Interventions DME instruction;Gait training;Functional mobility training;Therapeutic activities;Patient/family education;Therapeutic exercise;Balance training    PT Goals (Current goals can be found in the Care Plan section)  Acute Rehab PT Goals Patient Stated Goal: home, have less pain PT Goal Formulation: With patient Time For Goal Achievement: 11/12/22 Potential to Achieve Goals: Good    Frequency Min 2X/week     Co-evaluation               AM-PAC PT "6 Clicks" Mobility  Outcome Measure Help needed turning from your back to your side while in a flat bed without using bedrails?: A Little Help needed  moving from lying on your back to sitting on the side of a flat bed without using bedrails?: A Little Help needed moving to and from a bed to a chair (including a wheelchair)?: A Little Help needed standing up from a chair using your arms (e.g., wheelchair or bedside chair)?: A Little Help needed to walk in hospital room?: A Lot Help needed climbing 3-5 steps with a railing? : A Lot 6 Click Score: 16    End of Session Equipment Utilized During Treatment: Gait belt Activity Tolerance: Patient tolerated treatment well Patient left: with call bell/phone within reach;in bed;with bed alarm set;with family/visitor present Nurse Communication: Mobility status PT Visit Diagnosis: Other abnormalities of gait and mobility (R26.89);Difficulty in walking, not elsewhere classified (R26.2)    Time: 2244-9753 PT Time Calculation (min) (ACUTE ONLY): 19 min   Charges:   PT Evaluation $PT Eval Low Complexity: [place], PT  Acute Rehab Dept [facility]) [phone]  [facility] Weekend Pager [facility] only)  [phone]  10/29/2022   [facility] 10/29/2022, 1:25 PM

## 2022-10-29 NOTE — Evaluation (Signed)
Occupational Therapy Evaluation Patient Details Name: Eileen Wilkerson MRN: [id number] DOB: 03/27/1932 Today's Date: 10/29/2022   History of Present Illness Patient admitted with sepsis secondary to UTI. Eileen Wilkerson is a 88 y.o. female with a history of adrenal failure, rheumatoid arthritis on prednisone, asthma, diverticulosis, diverticulitis, recurrent UTI, E. coli bacteremia, fibromyalgia, GERD, HCAP, hyperlipidemia, osteoporosis, recurrent URI.  Patient presented secondary to decreased mentation with associated productive cough and fever.  When EMS arrived, patient was found to have hypoxia requiring administration of nonrebreather at 15 L/min.  On presentation to the emergency department, patient was found to have evidence of sepsis with concern for UTI source.   Clinical Impression   Eileen Wilkerson is an 88 year old woman admitted to hospital with above medical history and presents with generalized weakness, decreased activity tolerance, impaired balance and reporting bilateral foot pain. Patient needing min assist to transfer with walker but ambulation deferred due to pain in feet - patient thinking it is gout. Patient needing mod assist for LB ADLs and seated position for UB ADLs. Patient will benefit from skilled OT services while in hospital to improve deficits and learn compensatory strategies as needed in order to return to PLOF.  Patient reports she may be able to get more assistance from her aide and would like to go home at discharge.         Recommendations for follow up therapy are one component of a multi-disciplinary discharge planning process, led by the attending physician.  Recommendations may be updated based on patient status, additional functional criteria and insurance authorization.   Follow Up Recommendations  Home health OT     Assistance Recommended at Discharge Intermittent Supervision/Assistance  Patient can return home with the following A little  help with walking and/or transfers;A little help with bathing/dressing/bathroom;A lot of help with bathing/dressing/bathroom;Assistance with cooking/housework;Assist for transportation    Functional Status Assessment  Patient has had a recent decline in their functional status and demonstrates the ability to make significant improvements in function in a reasonable and predictable amount of time.  Equipment Recommendations  None recommended by OT    Recommendations for Other Services       Precautions / Restrictions Precautions Precautions: Fall Precaution Comments: painful feet (gout?) Restrictions Weight Bearing Restrictions: No      Mobility Bed Mobility Overal bed mobility: Needs Assistance Bed Mobility: Supine to Sit     Supine to sit: Min assist, HOB elevated          Transfers Overall transfer level: Needs assistance Equipment used: Rolling walker (2 wheels) Transfers: Sit to/from Stand, Bed to chair/wheelchair/BSC Sit to Stand: Min assist     Step pivot transfers: Min guard            Balance Overall balance assessment: Needs assistance Sitting-balance support: No upper extremity supported, Feet supported Sitting balance-Leahy Scale: Good     Standing balance support: During functional activity, Reliant on assistive device for balance Standing balance-Leahy Scale: Poor                             ADL either performed or assessed with clinical judgement   ADL Overall ADL's : Needs assistance/impaired Eating/Feeding: Independent   Grooming: Set up;Sitting   Upper Body Bathing: Set up;Sitting   Lower Body Bathing: Sit to/from stand;Moderate assistance   Upper Body Dressing : Set up;Sitting   Lower Body Dressing: Moderate assistance;Sit to/from stand   Toilet  Transfer: Minimal assistance;BSC/3in1   Toileting- Clothing Manipulation and Hygiene: Minimal assistance;Sit to/from stand       Functional mobility during ADLs: Minimal  assistance;Rolling walker (2 wheels)       Vision Patient Visual Report: No change from baseline       Perception     Praxis      Pertinent Vitals/Pain Pain Assessment Pain Assessment: Faces Faces Pain Scale: Hurts little more Pain Location: bilateral feet Pain Descriptors / Indicators: Aching Pain Intervention(s): Monitored during session     Hand Dominance Right   Extremity/Trunk Assessment Upper Extremity Assessment Upper Extremity Assessment: Overall WFL for tasks assessed   Lower Extremity Assessment Lower Extremity Assessment: Defer to PT evaluation   Cervical / Trunk Assessment Cervical / Trunk Assessment: Kyphotic   Communication Communication Communication: HOH   Cognition Arousal/Alertness: Awake/alert Behavior During Therapy: WFL for tasks assessed/performed Overall Cognitive Status: Within Functional Limits for tasks assessed                                       General Comments       Exercises     Shoulder Instructions      Home Living Family/patient expects to be discharged to:: Private residence Living Arrangements: Alone Available Help at Discharge: Family;Personal care attendant;Available PRN/intermittently (aide 3 xa week) Type of Home: Independent living facility Home Access: Level entry     Home Layout: One level     Bathroom Shower/Tub: Occupational psychologist: Handicapped height     Home Equipment: Rollator (4 wheels);Wheelchair - manual          Prior Functioning/Environment Prior Level of Function : Independent/Modified Independent             Mobility Comments: uses rollator for household distances, wheelchair for longer distances ADLs Comments: performs ADLs unassisted. help for homemaking, meals, transportation        OT Problem List: Decreased activity tolerance;Impaired balance (sitting and/or standing);Decreased strength;Pain      OT Treatment/Interventions: Self-care/ADL  training;Therapeutic exercise;DME and/or AE instruction;Therapeutic activities;Balance training;Patient/family education    OT Goals(Current goals can be found in the care plan section) Acute Rehab OT Goals Patient Stated Goal: go back home OT Goal Formulation: With patient Time For Goal Achievement: 11/12/22 Potential to Achieve Goals: Good  OT Frequency: Min 2X/week    Co-evaluation              AM-PAC OT "6 Clicks" Daily Activity     Outcome Measure Help from another person eating meals?: None Help from another person taking care of personal grooming?: A Little Help from another person toileting, which includes using toliet, bedpan, or urinal?: A Little Help from another person bathing (including washing, rinsing, drying)?: A Lot Help from another person to put on and taking off regular upper body clothing?: A Little Help from another person to put on and taking off regular lower body clothing?: A Lot 6 Click Score: 17   End of Session Equipment Utilized During Treatment: Rolling walker (2 wheels) Nurse Communication: Mobility status  Activity Tolerance: Patient tolerated treatment well Patient left: in chair;with call bell/phone within reach;with chair alarm set  OT Visit Diagnosis: Muscle weakness (generalized) (M62.81)                Time: 8119-1478 OT Time Calculation (min): 19 min Charges:  OT General Charges $OT Visit: 1 Visit OT  Evaluation $OT Eval Low Complexity: 1 Low  Gustavo Lah, OTR/L [place]  Office [phone]   Lenward Chancellor 10/29/2022, 10:20 AM

## 2022-10-29 NOTE — Plan of Care (Signed)
  Problem: Clinical Measurements: Goal: Diagnostic test results will improve Outcome: Progressing Goal: Signs and symptoms of infection will decrease Outcome: Progressing   Problem: Activity: Goal: Risk for activity intolerance will decrease Outcome: Progressing   Problem: Pain Managment: Goal: General experience of comfort will improve Outcome: Progressing

## 2022-10-29 NOTE — Progress Notes (Signed)
PROGRESS NOTE    Eileen Wilkerson  IHK:[id number] DOB: March 27, 1932 DOA: 10/27/2022 PCP: Mast, [name], NP   Brief Narrative: Eileen Wilkerson is a 88 y.o. female with a history of adrenal failure, rheumatoid arthritis on prednisone, asthma, diverticulosis, diverticulitis, recurrent UTI, E. coli bacteremia, fibromyalgia, GERD, HCAP, hyperlipidemia, osteoporosis, recurrent URI.  Patient presented secondary to decreased mentation with associated productive cough and fever.  When EMS arrived, patient was found to have hypoxia requiring administration of nonrebreather at 15 L/min.  On presentation to the emergency department, patient was found to have evidence of sepsis with concern for UTI source.  IV fluids and empiric antibiotics initiated on admission.  Chest imaging without evidence for acute infection.  Urine culture consistent with UTI.   Assessment and Plan:  Severe sepsis Present on admission. Associated lactic acid of 2.4 with a peak of 3.2 which is now resolved. UTI source. Empiric Ceftriaxone started on admission. IV fluids resuscitation given while in the ED.  Sepsis physiology appears to be improved.  UTI Urinalysis suggestive of likely UTI. Urine culture (catheterized) obtained on 11/17. Empiric Ceftriaxone started on admission.  Preliminary urine culture significant for 50,000 colonies of proteus mirabilis -Continue Ceftriaxone IV  Diarrhea Unclear etiology.  Concern for possible C. Difficile but testing was negative.  Patient with continued diarrheal symptoms while inpatient.  Rheumatoid arthritis Patient is on prednisone 10 mg daily as an outpatient. -Continue prednisone 10 mg daily  Gout Possible flare of bilateral ankles. -Colchicine 1.2 mg x1 followed by colchicine 0.6 mg BID until flare resolves. If no improvement after 3 days, may need to start steroid burst  GERD -Continue Protonix  Hyperlipidemia -Continue Lipitor  Productive cough Appears patient has a  history of recurrent upper respiratory infections.  No evidence of pneumonia on imaging. -Sputum culture ordered and is pending  Asthma -Continue Duonebs as needed.  History of DVT Not on chronic anticoagulation.  Patient was previously on Eliquis.  Daughter is concerned about recurrent DVT.  Patient reports some right calf pain. LE venous duplex negative for DVT bilaterally   DVT prophylaxis: Lovenox Code Status:   Code Status: Partial Code Family Communication: Daughter at bedside Disposition Plan: Patient is from independent living at [facility]. PT recommending SNF. Anticipate patient will be medically stable for discharge on 11/20   Consultants:  None  Procedures:  None  Antimicrobials: Ceftriaxone IV    Subjective: Patient feeling better overall. Feet have a feeling of "falling asleep." Per daughter, patient is more lucid today.  Objective: BP (!) 157/83 (BP Location: Left Arm)   Pulse 91   Temp 97.8 F (36.6 C) (Oral)   Resp 20   Ht 5' 2.5" (1.588 m)   Wt 85.4 kg   SpO2 100%   BMI 33.89 kg/m   Examination:  General exam: Appears calm and comfortable Respiratory system: Clear to auscultation. Respiratory effort normal. Cardiovascular system: S1 & S2 heard, RRR. No murmurs, rubs, gallops or clicks. Gastrointestinal system: Abdomen is nondistended, soft and nontender. No organomegaly or masses felt. Normal bowel sounds heard. Central nervous system: Alert and oriented. No focal neurological deficits. Musculoskeletal: Mild ankle edema. Some warmth over bilateral ankles without significant tenderness Skin: No cyanosis. No rashes Psychiatry: Judgement and insight appear normal. Mood & affect appropriate.    Data Reviewed: I have personally reviewed following labs and imaging studies  CBC Lab Results  Component Value Date   WBC 13.6 (H) 10/29/2022   RBC 3.29 (L) 10/29/2022  HGB 9.8 (L) 10/29/2022   HCT 32.8 (L) 10/29/2022   MCV 99.7 10/29/2022   MCH  29.8 10/29/2022   PLT 221 10/29/2022   MCHC 29.9 (L) 10/29/2022   RDW 14.0 10/29/2022   LYMPHSABS 2.2 10/29/2022   MONOABS 1.0 10/29/2022   EOSABS 0.1 10/29/2022   BASOSABS 0.0 67/67/2094     Last metabolic panel Lab Results  Component Value Date   NA 144 10/29/2022   K 2.9 (L) 10/29/2022   CL 111 10/29/2022   CO2 24 10/29/2022   BUN 13 10/29/2022   CREATININE 0.84 10/29/2022   GLUCOSE 103 (H) 10/29/2022   GFRNONAA >60 10/29/2022   GFRAA 56 (L) 02/03/2021   CALCIUM 8.8 (L) 10/29/2022   PHOS 2.8 06/03/2019   PROT 6.3 (L) 10/28/2022   ALBUMIN 2.7 (L) 10/28/2022   LABGLOB 3.2 08/08/2018   AGRATIO 1.4 08/08/2018   BILITOT 1.1 10/28/2022   ALKPHOS 47 10/28/2022   AST 19 10/28/2022   ALT 14 10/28/2022   ANIONGAP 9 10/29/2022    GFR: Estimated Creatinine Clearance: 47.4 mL/min (by C-G formula based on SCr of 0.84 mg/dL).  Recent Results (from the past 240 hour(s))  Culture, blood (Routine x 2)     Status: None (Preliminary result)   Collection Time: 10/27/22  9:45 AM   Specimen: BLOOD  Result Value Ref Range Status   Specimen Description   Final    BLOOD RIGHT ANTECUBITAL Performed at [facility] [address], [place], [place] [address]    Special Requests   Final    BOTTLES DRAWN AEROBIC AND ANAEROBIC Blood Culture results may not be optimal due to an excessive volume of blood received in culture bottles Performed at [facility] [address] [place], [place] [address]    Culture   Final    NO GROWTH 2 DAYS Performed at [facility] [address] [place], [place] [address]    Report Status PENDING  Incomplete  Resp Panel by RT-PCR (Flu A&B, Covid) Anterior Nasal Swab     Status: None   Collection Time: 10/27/22  9:51 AM   Specimen: Anterior Nasal Swab  Result Value Ref Range Status   SARS Coronavirus 2 by RT PCR NEGATIVE NEGATIVE Final    Comment: (NOTE) SARS-CoV-2 target nucleic acids are NOT  DETECTED.  The SARS-CoV-2 RNA is generally detectable in upper respiratory specimens during the acute phase of infection. The lowest concentration of SARS-CoV-2 viral copies this assay can detect is 138 copies/mL. A negative result does not preclude SARS-Cov-2 infection and should not be used as the sole basis for treatment or other patient management decisions. A negative result may occur with  improper specimen collection/handling, submission of specimen other than nasopharyngeal swab, presence of viral mutation(s) within the areas targeted by this assay, and inadequate number of viral copies(<138 copies/mL). A negative result must be combined with clinical observations, patient history, and epidemiological information. The expected result is Negative.  Fact Sheet for Patients:  EntrepreneurPulse.com.au  Fact Sheet for Healthcare Providers:  IncredibleEmployment.be  This test is no t yet approved or cleared by the [place] FDA and  has been authorized for detection and/or diagnosis of SARS-CoV-2 by FDA under an Emergency Use Authorization (EUA). This EUA will remain  in effect (meaning this test can be used) for the duration of the COVID-19 declaration under Section 564(b)(1) of the Act, 21 U.S.C.section 360bbb-3(b)(1), unless the authorization is terminated  or revoked sooner.  Influenza A by PCR NEGATIVE NEGATIVE Final   Influenza B by PCR NEGATIVE NEGATIVE Final    Comment: (NOTE) The Xpert Xpress SARS-CoV-2/FLU/RSV plus assay is intended as an aid in the diagnosis of influenza from Nasopharyngeal swab specimens and should not be used as a sole basis for treatment. Nasal washings and aspirates are unacceptable for Xpert Xpress SARS-CoV-2/FLU/RSV testing.  Fact Sheet for Patients: EntrepreneurPulse.com.au  Fact Sheet for Healthcare Providers: IncredibleEmployment.be  This test is not yet  approved or cleared by the [place] FDA and has been authorized for detection and/or diagnosis of SARS-CoV-2 by FDA under an Emergency Use Authorization (EUA). This EUA will remain in effect (meaning this test can be used) for the duration of the COVID-19 declaration under Section 564(b)(1) of the Act, 21 U.S.C. section 360bbb-3(b)(1), unless the authorization is terminated or revoked.  Performed at [facility], [place] [address], [place], [place] [address]   Culture, blood (Routine x 2)     Status: None (Preliminary result)   Collection Time: 10/27/22  9:55 AM   Specimen: BLOOD LEFT HAND  Result Value Ref Range Status   Specimen Description   Final    BLOOD LEFT HAND Performed at [facility] [address], [place], [place] [address]    Special Requests   Final    BOTTLES DRAWN AEROBIC AND ANAEROBIC Blood Culture results may not be optimal due to an inadequate volume of blood received in culture bottles Performed at [facility] [address] [place], [place] [address]    Culture   Final    NO GROWTH 2 DAYS Performed at [facility] [address] [place], [place] [address]    Report Status PENDING  Incomplete  Urine Culture     Status: Abnormal   Collection Time: 10/27/22 10:57 AM   Specimen: In/Out Cath Urine  Result Value Ref Range Status   Specimen Description   Final    IN/OUT CATH URINE Performed at [facility] [address] [place], [place] [address]    Special Requests   Final    NONE Performed at [facility], [place] [address] [place], [place] [address]    Culture 50,000 COLONIES/mL PROTEUS MIRABILIS (A)  Final   Report Status 10/29/2022 FINAL  Final   Organism ID, Bacteria PROTEUS MIRABILIS (A)  Final      Susceptibility   Proteus mirabilis - MIC*    AMPICILLIN <=2 SENSITIVE Sensitive     CEFAZOLIN 8 SENSITIVE Sensitive     CEFEPIME <=0.12  SENSITIVE Sensitive     CEFTRIAXONE <=0.25 SENSITIVE Sensitive     CIPROFLOXACIN <=0.25 SENSITIVE Sensitive     GENTAMICIN <=1 SENSITIVE Sensitive     IMIPENEM 2 SENSITIVE Sensitive     NITROFURANTOIN 128 RESISTANT Resistant     TRIMETH/SULFA <=20 SENSITIVE Sensitive     AMPICILLIN/SULBACTAM <=2 SENSITIVE Sensitive     PIP/TAZO <=4 SENSITIVE Sensitive     * 50,000 COLONIES/mL PROTEUS MIRABILIS  C Difficile Quick Screen w PCR reflex     Status: None   Collection Time: 10/28/22  3:00 PM   Specimen: STOOL  Result Value Ref Range Status   C Diff antigen NEGATIVE NEGATIVE Final   C Diff toxin NEGATIVE NEGATIVE Final   C Diff interpretation No C. difficile detected.  Final    Comment: Performed at [facility], [place] [address] [place], [place] [address]  Radiology Studies: VAS [place] LOWER EXTREMITY VENOUS (DVT)  Result Date: 10/28/2022  Lower Venous DVT Study Patient Name:  SHEKIRA DRUMMER  Date of Exam:   10/28/2022 Medical Rec #: [id number]           Accession #:    [id number] Date of Birth: 05-01-1932           Patient Gender: F Patient Age:   89 years Exam Location:  [facility] Procedure:      VAS [place] LOWER EXTREMITY VENOUS (DVT) Referring Phys: Szymon Foiles --------------------------------------------------------------------------------  Indications: RLE pain.  Risk Factors: DVT 05/2022 RLE. Limitations: Poor ultrasound/tissue interface. Comparison Study: Previous exam on 05/26/22 was positive for DVT in RLE posterior                   tibial vein. Performing Technologist: Rogelia Rohrer RVT, RDMS  Examination Guidelines: A complete evaluation includes B-mode imaging, spectral Doppler, color Doppler, and power Doppler as needed of all accessible portions of each vessel. Bilateral testing is considered an integral part of a complete examination. Limited examinations for reoccurring indications may be performed as noted. The reflux portion of the exam is performed with  the patient in reverse Trendelenburg.  +---------+---------------+---------+-----------+----------+--------------+ RIGHT    CompressibilityPhasicitySpontaneityPropertiesThrombus Aging +---------+---------------+---------+-----------+----------+--------------+ CFV      Full           Yes      Yes                                 +---------+---------------+---------+-----------+----------+--------------+ SFJ      Full                                                        +---------+---------------+---------+-----------+----------+--------------+ FV Prox  Full           Yes      Yes                                 +---------+---------------+---------+-----------+----------+--------------+ FV Mid   Full           Yes      Yes                                 +---------+---------------+---------+-----------+----------+--------------+ FV DistalFull           Yes      Yes                                 +---------+---------------+---------+-----------+----------+--------------+ PFV      Full                                                        +---------+---------------+---------+-----------+----------+--------------+ POP      Full           Yes      Yes                                 +---------+---------------+---------+-----------+----------+--------------+  PTV      Full                                                        +---------+---------------+---------+-----------+----------+--------------+ PERO     Full                                                        +---------+---------------+---------+-----------+----------+--------------+   +---------+---------------+---------+-----------+----------+--------------+ LEFT     CompressibilityPhasicitySpontaneityPropertiesThrombus Aging +---------+---------------+---------+-----------+----------+--------------+ CFV      Full           Yes      Yes                                  +---------+---------------+---------+-----------+----------+--------------+ SFJ      Full                                                        +---------+---------------+---------+-----------+----------+--------------+ FV Prox  Full           Yes      Yes                                 +---------+---------------+---------+-----------+----------+--------------+ FV Mid   Full           Yes      Yes                                 +---------+---------------+---------+-----------+----------+--------------+ FV DistalFull           Yes      Yes                                 +---------+---------------+---------+-----------+----------+--------------+ PFV      Full                                                        +---------+---------------+---------+-----------+----------+--------------+ POP      Full           Yes      Yes                                 +---------+---------------+---------+-----------+----------+--------------+ PTV      Full                                                        +---------+---------------+---------+-----------+----------+--------------+  PERO     Full                                                        +---------+---------------+---------+-----------+----------+--------------+     Summary: BILATERAL: - No evidence of deep vein thrombosis seen in the lower extremities, bilaterally. -No evidence of popliteal cyst, bilaterally.   *See table(s) above for measurements and observations. Electronically signed by Deitra Mayo MD on 10/28/2022 at 3:17:57 PM.    Final       LOS: 2 days    Cordelia Poche, MD [facility] 10/29/2022, 2:14 PM   If 7PM-7AM, please contact night-coverage [email or web address]

## 2022-10-30 ENCOUNTER — Inpatient Hospital Stay (HOSPITAL_COMMUNITY): Payer: Medicare PPO

## 2022-10-30 DIAGNOSIS — R197 Diarrhea, unspecified: Secondary | ICD-10-CM | POA: Diagnosis not present

## 2022-10-30 DIAGNOSIS — K219 Gastro-esophageal reflux disease without esophagitis: Secondary | ICD-10-CM | POA: Diagnosis not present

## 2022-10-30 DIAGNOSIS — A419 Sepsis, unspecified organism: Secondary | ICD-10-CM | POA: Diagnosis not present

## 2022-10-30 DIAGNOSIS — M0579 Rheumatoid arthritis with rheumatoid factor of multiple sites without organ or systems involvement: Secondary | ICD-10-CM | POA: Diagnosis not present

## 2022-10-30 LAB — BASIC METABOLIC PANEL
Anion gap: 10 (ref 5–15)
BUN: 11 mg/dL (ref 8–23)
CO2: 24 mmol/L (ref 22–32)
Calcium: 8.6 mg/dL — ABNORMAL LOW (ref 8.9–10.3)
Chloride: 109 mmol/L (ref 98–111)
Creatinine, Ser: 0.94 mg/dL (ref 0.44–1.00)
GFR, Estimated: 58 mL/min — ABNORMAL LOW (ref >60)
Glucose, Bld: 98 mg/dL (ref 70–99)
Potassium: 3.1 mmol/L — ABNORMAL LOW (ref 3.5–5.1)
Sodium: 143 mmol/L (ref 135–145)

## 2022-10-30 LAB — CBC
HCT: 37.5 % (ref 36.0–46.0)
Hemoglobin: 11 g/dL — ABNORMAL LOW (ref 12.0–15.0)
MCH: 30.2 pg (ref 26.0–34.0)
MCHC: 29.3 g/dL — ABNORMAL LOW (ref 30.0–36.0)
MCV: 103 fL — ABNORMAL HIGH (ref 80.0–100.0)
Platelets: 246 10*3/uL (ref 150–400)
RBC: 3.64 MIL/uL — ABNORMAL LOW (ref 3.87–5.11)
RDW: 13.7 % (ref 11.5–15.5)
WBC: 11.9 10*3/uL — ABNORMAL HIGH (ref 4.0–10.5)
nRBC: 0 % (ref 0.0–0.2)

## 2022-10-30 LAB — PROCALCITONIN: Procalcitonin: 1.21 ng/mL

## 2022-10-30 MED ORDER — POLYETHYLENE GLYCOL 3350 17 G PO PACK: 17.0000 g | PACK | Freq: Every day | ORAL | Status: DC | PRN

## 2022-10-30 MED ORDER — POLYETHYLENE GLYCOL 3350 17 G PO PACK: 17.0000 g | PACK | Freq: Two times a day (BID) | ORAL | Status: DC

## 2022-10-30 MED ORDER — ORAL CARE MOUTH RINSE: 15.0000 mL | OROMUCOSAL | Status: DC | PRN

## 2022-10-30 MED ORDER — TRAMADOL HCL 50 MG PO TABS
25.0000 mg | ORAL_TABLET | Freq: Once | ORAL | Status: AC
  Administered 2022-10-30: 25 mg via ORAL
  Filled 2022-10-30: qty 1

## 2022-10-30 MED ORDER — PREDNISONE 20 MG PO TABS
40.0000 mg | ORAL_TABLET | Freq: Every day | ORAL | Status: DC
  Administered 2022-10-31 – 2022-11-01 (×2): 40 mg via ORAL
  Filled 2022-10-30 (×2): qty 2

## 2022-10-30 MED ORDER — POTASSIUM CHLORIDE CRYS ER 20 MEQ PO TBCR
40.0000 meq | EXTENDED_RELEASE_TABLET | ORAL | Status: AC
  Administered 2022-10-30 (×2): 40 meq via ORAL
  Filled 2022-10-30 (×2): qty 2

## 2022-10-30 MED ORDER — LOPERAMIDE HCL 2 MG PO CAPS
2.0000 mg | ORAL_CAPSULE | ORAL | Status: DC | PRN
  Administered 2022-10-30 – 2022-11-01 (×3): 2 mg via ORAL
  Filled 2022-10-30 (×3): qty 1

## 2022-10-30 MED ORDER — METHYLPREDNISOLONE SODIUM SUCC 40 MG IJ SOLR
40.0000 mg | Freq: Once | INTRAMUSCULAR | Status: AC
  Administered 2022-10-30: 40 mg via INTRAVENOUS
  Filled 2022-10-30: qty 1

## 2022-10-30 NOTE — Progress Notes (Signed)
PROGRESS NOTE    Eileen Wilkerson  OIZ:[id number] DOB: 1932-10-10 DOA: 10/27/2022 PCP: Mast, [name], NP   Brief Narrative: Eileen Wilkerson is a 88 y.o. female with a history of adrenal failure, rheumatoid arthritis on prednisone, asthma, diverticulosis, diverticulitis, recurrent UTI, E. coli bacteremia, fibromyalgia, GERD, HCAP, hyperlipidemia, osteoporosis, recurrent URI.  Patient presented secondary to decreased mentation with associated productive cough and fever.  When EMS arrived, patient was found to have hypoxia requiring administration of nonrebreather at 15 L/min.  On presentation to the emergency department, patient was found to have evidence of sepsis with concern for UTI source.  IV fluids and empiric antibiotics initiated on admission.  Chest imaging without evidence for acute infection.  Urine culture consistent with UTI.   Assessment and Plan:  Severe sepsis Present on admission. Associated lactic acid of 2.4 with a peak of 3.2 which is now resolved. UTI source. Empiric Ceftriaxone started on admission. IV fluids resuscitation given while in the ED.  Sepsis physiology improved.  UTI Urinalysis suggestive of likely UTI. Urine culture (catheterized) obtained on 11/17. Empiric Ceftriaxone started on admission.  Preliminary urine culture significant for 50,000 colonies of proteus mirabilis -Continue Ceftriaxone IV  Diarrhea Unclear etiology.  Concern for possible C. Difficile but testing was negative.  Patient with continued diarrheal symptoms while inpatient. Appears to have improved.  Rheumatoid arthritis Patient is on prednisone 10 mg daily as an outpatient. Prednisone dose increased to 40 mg for gout flare  Gout Possible flare of bilateral ankles. -Colchicine 0.6 mg BID while with symptoms -Solumedrol 40 mg IV x1 and increase to prednisone 40 mg daily -Left foot x-ray  GERD -Continue Protonix  Hyperlipidemia -Continue Lipitor  Productive cough Appears  patient has a history of recurrent upper respiratory infections.  No evidence of pneumonia on imaging. -Sputum culture ordered and is pending  Asthma -Continue Duonebs as needed.  History of DVT Not on chronic anticoagulation.  Patient was previously on Eliquis.  Daughter is concerned about recurrent DVT.  Patient reports some right calf pain. LE venous duplex negative for DVT bilaterally   DVT prophylaxis: Lovenox Code Status:   Code Status: Partial Code Family Communication: None at bedside Disposition Plan: Patient is from independent living at [facility]. PT recommending SNF.Patient not medically stable today but possible on 11/21.   Consultants:  None  Procedures:  None  Antimicrobials: Ceftriaxone IV    Subjective: Patient reports continued bilateral foot pain which is worse in left.   Objective: BP (!) 148/69 (BP Location: Left Arm)   Pulse 71   Temp 98.6 F (37 C)   Resp 17   Ht 5' 2.5" (1.588 m)   Wt 85.4 kg   SpO2 97%   BMI 33.89 kg/m   Examination:  General exam: Appears calm and comfortable Respiratory system: Clear to auscultation. Respiratory effort normal. Cardiovascular system: S1 & S2 heard, RRR. Gastrointestinal system: Abdomen is nondistended, soft and nontender. Normal bowel sounds heard. Central nervous system: Alert and oriented. No focal neurological deficits. Musculoskeletal: Bilateral ankle edema in addition to edema of dorsal aspect of left foot. Skin: erythema of left dorsolateral aspect of foot with no associated tenderness Psychiatry: Judgement and insight appear normal. Mood & affect appropriate.    Data Reviewed: I have personally reviewed following labs and imaging studies  CBC Lab Results  Component Value Date   WBC 13.6 (H) 10/29/2022   RBC 3.29 (L) 10/29/2022   HGB 9.8 (L) 10/29/2022   HCT 32.8 (L)  10/29/2022   MCV 99.7 10/29/2022   MCH 29.8 10/29/2022   PLT 221 10/29/2022   MCHC 29.9 (L) 10/29/2022   RDW 14.0  10/29/2022   LYMPHSABS 2.2 10/29/2022   MONOABS 1.0 10/29/2022   EOSABS 0.1 10/29/2022   BASOSABS 0.0 96/28/3662     Last metabolic panel Lab Results  Component Value Date   NA 144 10/29/2022   K 2.9 (L) 10/29/2022   CL 111 10/29/2022   CO2 24 10/29/2022   BUN 13 10/29/2022   CREATININE 0.84 10/29/2022   GLUCOSE 103 (H) 10/29/2022   GFRNONAA >60 10/29/2022   GFRAA 56 (L) 02/03/2021   CALCIUM 8.8 (L) 10/29/2022   PHOS 2.8 06/03/2019   PROT 6.3 (L) 10/28/2022   ALBUMIN 2.7 (L) 10/28/2022   LABGLOB 3.2 08/08/2018   AGRATIO 1.4 08/08/2018   BILITOT 1.1 10/28/2022   ALKPHOS 47 10/28/2022   AST 19 10/28/2022   ALT 14 10/28/2022   ANIONGAP 9 10/29/2022    GFR: Estimated Creatinine Clearance: 47.4 mL/min (by C-G formula based on SCr of 0.84 mg/dL).  Recent Results (from the past 240 hour(s))  Culture, blood (Routine x 2)     Status: None (Preliminary result)   Collection Time: 10/27/22  9:45 AM   Specimen: BLOOD  Result Value Ref Range Status   Specimen Description   Final    BLOOD RIGHT ANTECUBITAL Performed at [facility] [address], [place], [place] [address]    Special Requests   Final    BOTTLES DRAWN AEROBIC AND ANAEROBIC Blood Culture results may not be optimal due to an excessive volume of blood received in culture bottles Performed at [facility] [address] [place], [place] [address]    Culture   Final    NO GROWTH 3 DAYS Performed at [facility], [place] [address], [place], [place] [address]    Report Status PENDING  Incomplete  Resp Panel by RT-PCR (Flu A&B, Covid) Anterior Nasal Swab     Status: None   Collection Time: 10/27/22  9:51 AM   Specimen: Anterior Nasal Swab  Result Value Ref Range Status   SARS Coronavirus 2 by RT PCR NEGATIVE NEGATIVE Final    Comment: (NOTE) SARS-CoV-2 target nucleic acids are NOT DETECTED.  The SARS-CoV-2 RNA is generally detectable in upper respiratory specimens  during the acute phase of infection. The lowest concentration of SARS-CoV-2 viral copies this assay can detect is 138 copies/mL. A negative result does not preclude SARS-Cov-2 infection and should not be used as the sole basis for treatment or other patient management decisions. A negative result may occur with  improper specimen collection/handling, submission of specimen other than nasopharyngeal swab, presence of viral mutation(s) within the areas targeted by this assay, and inadequate number of viral copies(<138 copies/mL). A negative result must be combined with clinical observations, patient history, and epidemiological information. The expected result is Negative.  Fact Sheet for Patients:  EntrepreneurPulse.com.au  Fact Sheet for Healthcare Providers:  IncredibleEmployment.be  This test is no t yet approved or cleared by the [place] FDA and  has been authorized for detection and/or diagnosis of SARS-CoV-2 by FDA under an Emergency Use Authorization (EUA). This EUA will remain  in effect (meaning this test can be used) for the duration of the COVID-19 declaration under Section 564(b)(1) of the Act, 21 U.S.C.section 360bbb-3(b)(1), unless the authorization is terminated  or revoked sooner.       Influenza A by PCR  NEGATIVE NEGATIVE Final   Influenza B by PCR NEGATIVE NEGATIVE Final    Comment: (NOTE) The Xpert Xpress SARS-CoV-2/FLU/RSV plus assay is intended as an aid in the diagnosis of influenza from Nasopharyngeal swab specimens and should not be used as a sole basis for treatment. Nasal washings and aspirates are unacceptable for Xpert Xpress SARS-CoV-2/FLU/RSV testing.  Fact Sheet for Patients: EntrepreneurPulse.com.au  Fact Sheet for Healthcare Providers: IncredibleEmployment.be  This test is not yet approved or cleared by the [place] FDA and has been authorized for detection  and/or diagnosis of SARS-CoV-2 by FDA under an Emergency Use Authorization (EUA). This EUA will remain in effect (meaning this test can be used) for the duration of the COVID-19 declaration under Section 564(b)(1) of the Act, 21 U.S.C. section 360bbb-3(b)(1), unless the authorization is terminated or revoked.  Performed at [facility], [place] [address], [place], [place] [address]   Culture, blood (Routine x 2)     Status: None (Preliminary result)   Collection Time: 10/27/22  9:55 AM   Specimen: BLOOD LEFT HAND  Result Value Ref Range Status   Specimen Description   Final    BLOOD LEFT HAND Performed at [facility] [address], [place], [place] [address]    Special Requests   Final    BOTTLES DRAWN AEROBIC AND ANAEROBIC Blood Culture results may not be optimal due to an inadequate volume of blood received in culture bottles Performed at [facility] [address] [place], [place] [address]    Culture   Final    NO GROWTH 3 DAYS Performed at [facility], [place] [address], [place], [place] [address]    Report Status PENDING  Incomplete  Urine Culture     Status: Abnormal   Collection Time: 10/27/22 10:57 AM   Specimen: In/Out Cath Urine  Result Value Ref Range Status   Specimen Description   Final    IN/OUT CATH URINE Performed at [facility] [address], [place], [place] [address]    Special Requests   Final    NONE Performed at [facility], [place] [address], [place], [place] [address]    Culture 50,000 COLONIES/mL PROTEUS MIRABILIS (A)  Final   Report Status 10/29/2022 FINAL  Final   Organism ID, Bacteria PROTEUS MIRABILIS (A)  Final      Susceptibility   Proteus mirabilis - MIC*    AMPICILLIN <=2 SENSITIVE Sensitive     CEFAZOLIN 8 SENSITIVE Sensitive     CEFEPIME <=0.12 SENSITIVE Sensitive     CEFTRIAXONE <=0.25 SENSITIVE Sensitive     CIPROFLOXACIN <=0.25  SENSITIVE Sensitive     GENTAMICIN <=1 SENSITIVE Sensitive     IMIPENEM 2 SENSITIVE Sensitive     NITROFURANTOIN 128 RESISTANT Resistant     TRIMETH/SULFA <=20 SENSITIVE Sensitive     AMPICILLIN/SULBACTAM <=2 SENSITIVE Sensitive     PIP/TAZO <=4 SENSITIVE Sensitive     * 50,000 COLONIES/mL PROTEUS MIRABILIS  C Difficile Quick Screen w PCR reflex     Status: None   Collection Time: 10/28/22  3:00 PM   Specimen: STOOL  Result Value Ref Range Status   C Diff antigen NEGATIVE NEGATIVE Final   C Diff toxin NEGATIVE NEGATIVE Final   C Diff interpretation No C. difficile detected.  Final    Comment: Performed at [facility], [place] [address], [place], [place] [address]      Radiology Studies: VAS [place]  LOWER EXTREMITY VENOUS (DVT)  Result Date: 10/28/2022  Lower Venous DVT Study Patient Name:  YAHIRA TIMBERMAN  Date of Exam:   10/28/2022 Medical Rec #: [id number]           Accession #:    [id number] Date of Birth: [birth year]           Patient Gender: F Patient Age:   41 years Exam Location:  [facility] Procedure:      VAS [place] LOWER EXTREMITY VENOUS (DVT) Referring Phys: Dream Nodal --------------------------------------------------------------------------------  Indications: RLE pain.  Risk Factors: DVT 05/2022 RLE. Limitations: Poor ultrasound/tissue interface. Comparison Study: Previous exam on 05/26/22 was positive for DVT in RLE posterior                   tibial vein. Performing Technologist: Rogelia Rohrer RVT, RDMS  Examination Guidelines: A complete evaluation includes B-mode imaging, spectral Doppler, color Doppler, and power Doppler as needed of all accessible portions of each vessel. Bilateral testing is considered an integral part of a complete examination. Limited examinations for reoccurring indications may be performed as noted. The reflux portion of the exam is performed with the patient in reverse Trendelenburg.   +---------+---------------+---------+-----------+----------+--------------+ RIGHT    CompressibilityPhasicitySpontaneityPropertiesThrombus Aging +---------+---------------+---------+-----------+----------+--------------+ CFV      Full           Yes      Yes                                 +---------+---------------+---------+-----------+----------+--------------+ SFJ      Full                                                        +---------+---------------+---------+-----------+----------+--------------+ FV Prox  Full           Yes      Yes                                 +---------+---------------+---------+-----------+----------+--------------+ FV Mid   Full           Yes      Yes                                 +---------+---------------+---------+-----------+----------+--------------+ FV DistalFull           Yes      Yes                                 +---------+---------------+---------+-----------+----------+--------------+ PFV      Full                                                        +---------+---------------+---------+-----------+----------+--------------+ POP      Full           Yes      Yes                                 +---------+---------------+---------+-----------+----------+--------------+  PTV      Full                                                        +---------+---------------+---------+-----------+----------+--------------+ PERO     Full                                                        +---------+---------------+---------+-----------+----------+--------------+   +---------+---------------+---------+-----------+----------+--------------+ LEFT     CompressibilityPhasicitySpontaneityPropertiesThrombus Aging +---------+---------------+---------+-----------+----------+--------------+ CFV      Full           Yes      Yes                                  +---------+---------------+---------+-----------+----------+--------------+ SFJ      Full                                                        +---------+---------------+---------+-----------+----------+--------------+ FV Prox  Full           Yes      Yes                                 +---------+---------------+---------+-----------+----------+--------------+ FV Mid   Full           Yes      Yes                                 +---------+---------------+---------+-----------+----------+--------------+ FV DistalFull           Yes      Yes                                 +---------+---------------+---------+-----------+----------+--------------+ PFV      Full                                                        +---------+---------------+---------+-----------+----------+--------------+ POP      Full           Yes      Yes                                 +---------+---------------+---------+-----------+----------+--------------+ PTV      Full                                                        +---------+---------------+---------+-----------+----------+--------------+  PERO     Full                                                        +---------+---------------+---------+-----------+----------+--------------+     Summary: BILATERAL: - No evidence of deep vein thrombosis seen in the lower extremities, bilaterally. -No evidence of popliteal cyst, bilaterally.   *See table(s) above for measurements and observations. Electronically signed by Deitra Mayo MD on 10/28/2022 at 3:17:57 PM.    Final       LOS: 3 days    Cordelia Poche, MD [facility] 10/30/2022, 11:20 AM   If 7PM-7AM, please contact night-coverage [email or web address]

## 2022-10-30 NOTE — Progress Notes (Signed)
Foley catheter removed at 1130 per MD Nettey. Hat in [facility] to measure next UOP.

## 2022-10-30 NOTE — Care Management Important Message (Signed)
Important Message  Patient Details IM Letter given. Name: Eileen Wilkerson MRN: [id number] Date of Birth: Aug 15, 1932   Medicare Important Message Given:  Yes     Kerin Salen 10/30/2022, 12:37 PM

## 2022-10-30 NOTE — TOC Progression Note (Signed)
Transition of Care [facility]) - Progression Note    Patient Details  Name: Eileen Wilkerson MRN: [id number] Date of Birth: 1932-06-24  Transition of Care [facility]) CM/SW Contact  Purcell Mouton, RN Phone Number: 10/30/2022, 3:10 PM  Clinical Narrative:     Spoke with Eileen Wilkerson and Eileen Wilkerson at [facility]. Pt will have a bed at SNF when ready. Will need to start insurance authorization. Spoke with daughter Eileen Wilkerson concerning pt discharging to SNF.        Expected Discharge Plan and Services                                                 Social Determinants of Health (SDOH) Interventions    Readmission Risk Interventions    05/30/2022   10:08 AM  Readmission Risk Prevention Plan  Transportation Screening Complete  PCP or Specialist Appt within 5-7 Days Complete  Home Care Screening Complete  Medication Review (RN CM) Complete

## 2022-10-30 NOTE — Progress Notes (Signed)
Bladder scan done with volume of 154 ml.

## 2022-10-31 LAB — POTASSIUM: Potassium: 4 mmol/L (ref 3.5–5.1)

## 2022-10-31 MED ORDER — CEFADROXIL 500 MG PO CAPS
1000.0000 mg | ORAL_CAPSULE | Freq: Two times a day (BID) | ORAL | Status: DC
  Administered 2022-10-31 – 2022-11-01 (×3): 1000 mg via ORAL
  Filled 2022-10-31 (×4): qty 2

## 2022-10-31 MED ORDER — TRAMADOL HCL 50 MG PO TABS
50.0000 mg | ORAL_TABLET | Freq: Four times a day (QID) | ORAL | Status: DC
  Administered 2022-10-31 – 2022-11-01 (×5): 50 mg via ORAL
  Filled 2022-10-31 (×5): qty 1

## 2022-10-31 NOTE — Progress Notes (Signed)
Physical Therapy Treatment Patient Details Name: Eileen Wilkerson MRN: [id number] DOB: May 25, 1932 Today's Date: 10/31/2022   History of Present Illness Patient admitted with sepsis secondary to UTI. Eileen Wilkerson is a 88 y.o. female with a history of adrenal failure, rheumatoid arthritis on prednisone, asthma, diverticulosis, diverticulitis, recurrent UTI, E. coli bacteremia, fibromyalgia, GERD, HCAP, hyperlipidemia, osteoporosis, recurrent URI.  Patient presented secondary to decreased mentation with associated productive cough and fever.  When EMS arrived, patient was found to have hypoxia requiring administration of nonrebreather at 15 L/min.  On presentation to the emergency department, patient was found to have evidence of sepsis with concern for UTI source.    PT Comments    Patient c/o of 7/10 pain in bilateral feet at rest and increased to 9/10 at the end of session. Required min assist to bring trunk upright during supine to sit and assist needed to guide LEs on bed for sit to supine. Min/mod assist needed or transfers to steady during initial standing and to power up. Required min assist from elevated bed and mod assist from recliner, needed more assist as pt fatigued. Min assist needed for stand pivot to bed from recliner. Pt needed encouragment to ambulate "my feet feel like lumps and they hurt so bad." Required min assist to ambulate 20 ft, pt unable to ambulate further due to increased pain. Pt will need ST Rehab at SNF to address mobility and functional decline prior to safely returning home.       Recommendations for follow up therapy are one component of a multi-disciplinary discharge planning process, led by the attending physician.  Recommendations may be updated based on patient status, additional functional criteria and insurance authorization.  Follow Up Recommendations  Skilled nursing-short term rehab (<3 hours/day)     Assistance Recommended at Discharge  Intermittent Supervision/Assistance  Patient can return home with the following A little help with walking and/or transfers;A little help with bathing/dressing/bathroom;Assist for transportation;Help with stairs or ramp for entrance;Assistance with cooking/housework   Equipment Recommendations  None recommended by PT    Recommendations for Other Services       Precautions / Restrictions Precautions Precautions: Fall Precaution Comments: painful feet (gout?) Restrictions Weight Bearing Restrictions: No     Mobility  Bed Mobility Overal bed mobility: Needs Assistance Bed Mobility: Supine to Sit, Sit to Supine     Supine to sit: Min assist, HOB elevated Sit to supine: Min assist   General bed mobility comments: assist to bring trunk upright during supine to sit and assist needed to guide LEs on bed for sit to supine.    Transfers Overall transfer level: Needs assistance Equipment used: Rolling walker (2 wheels) Transfers: Sit to/from Stand, Bed to chair/wheelchair/BSC Sit to Stand: Min assist, Mod assist Stand pivot transfers: Min assist         General transfer comment: STS x 3;Assist needed to steady during initial standing and to power up. Required min assist from elevated bed and mod assist from recliner.    Ambulation/Gait Ambulation/Gait assistance: Min assist Gait Distance (Feet): 20 Feet Assistive device: Rolling walker (2 wheels) Gait Pattern/deviations: Step-through pattern, Decreased stride length, Trunk flexed Gait velocity: decr     General Gait Details: Pt needed encouragment to ambulate"my feet feel like lumps and they hurt so bad." Required min assist to ambulate 20 ft, pt unable to ambulate further due to increased pain.   Stairs             Emergency planning/management officer  Modified Rankin (Stroke Patients Only)       Balance                                            Cognition Arousal/Alertness: Awake/alert Behavior  During Therapy: WFL for tasks assessed/performed Overall Cognitive Status: Within Functional Limits for tasks assessed                                          Exercises      General Comments        Pertinent Vitals/Pain Pain Assessment Pain Assessment: 0-10 Pain Score: 7  Pain Location: bilateral feet Pain Descriptors / Indicators: Aching Pain Intervention(s): Limited activity within patient's tolerance, Monitored during session, Premedicated before session    Home Living                          Prior Function            PT Goals (current goals can now be found in the care plan section) Acute Rehab PT Goals Patient Stated Goal: home, have less pain PT Goal Formulation: With patient Time For Goal Achievement: 11/12/22 Potential to Achieve Goals: Good Progress towards PT goals: Progressing toward goals    Frequency    Min 2X/week      PT Plan      Co-evaluation              AM-PAC PT "6 Clicks" Mobility   Outcome Measure  Help needed turning from your back to your side while in a flat bed without using bedrails?: A Little Help needed moving from lying on your back to sitting on the side of a flat bed without using bedrails?: A Little Help needed moving to and from a bed to a chair (including a wheelchair)?: A Little Help needed standing up from a chair using your arms (e.g., wheelchair or bedside chair)?: A Little Help needed to walk in hospital room?: A Lot Help needed climbing 3-5 steps with a railing? : A Lot 6 Click Score: 16    End of Session Equipment Utilized During Treatment: Gait belt Activity Tolerance: Patient limited by pain Patient left: with call bell/phone within reach;in bed;with bed alarm set Nurse Communication: Mobility status PT Visit Diagnosis: Other abnormalities of gait and mobility (R26.89);Difficulty in walking, not elsewhere classified (R26.2)     Time: 7616-0737 PT Time Calculation (min)  (ACUTE ONLY): 28 min  Charges:  $Gait Training: 8-22 mins $Therapeutic Activity: 8-22 mins                        Allyson Sabal 10/31/2022, 9:48 AM

## 2022-10-31 NOTE — [place] Notes (Signed)
PROGRESS NOTE    Eileen Wilkerson  PNT:[id number] DOB: [birth year] DOA: 10/27/2022 PCP: Mast, [name], NP   Brief Narrative: Eileen Wilkerson is a 88 y.o. female with a history of adrenal failure, rheumatoid arthritis on prednisone, asthma, diverticulosis, diverticulitis, recurrent UTI, E. coli bacteremia, fibromyalgia, GERD, HCAP, hyperlipidemia, osteoporosis, recurrent URI.  Patient presented secondary to decreased mentation with associated productive cough and fever.  When EMS arrived, patient was found to have hypoxia requiring administration of nonrebreather at 15 L/min.  On presentation to the emergency department, patient was found to have evidence of sepsis with concern for UTI source.  IV fluids and empiric antibiotics initiated on admission.  Chest imaging without evidence for acute infection.  Urine culture consistent with UTI.   Assessment and Plan:  Severe sepsis Present on admission. Associated lactic acid of 2.4 with a peak of 3.2 which is now resolved. UTI source. Empiric Ceftriaxone started on admission. IV fluids resuscitation given while in the ED.  Sepsis physiology improved.  UTI Urinalysis suggestive of likely UTI. Urine culture (catheterized) obtained on 11/17. Empiric Ceftriaxone started on admission.  Preliminary urine culture significant for 50,000 colonies of proteus mirabilis -Discontinue Ceftriaxone and start Cefadroxil  Diarrhea Unclear etiology.  Concern for possible C. Difficile but testing was negative.  Patient with continued diarrheal symptoms while inpatient. Appears to have improved.  Rheumatoid arthritis Patient is on prednisone 10 mg daily as an outpatient. Prednisone dose increased to 40 mg for gout flare -Resume prednisone 10 mg daily once treatment for gout flare completed  Gout Possible flare of bilateral ankles. No improvement with colchicine but quick improvement with increased steroid dose -Colchicine 0.6 mg BID. Can likely transition  back to daily dosing in 1-2 days -Prednisone 40 mg daily  GERD -Continue Protonix  Hyperlipidemia -Continue Lipitor  Productive cough Appears patient has a history of recurrent upper respiratory infections.  No evidence of pneumonia on imaging. -Sputum culture ordered and is pending  Asthma -Continue Duonebs as needed.  History of DVT Not on chronic anticoagulation.  Patient was previously on Eliquis.  Daughter is concerned about recurrent DVT.  Patient reports some right calf pain. LE venous duplex negative for DVT bilaterally   DVT prophylaxis: Lovenox Code Status:   Code Status: Partial Code Family Communication: None at bedside Disposition Plan: Patient is from independent living at [facility]. PT recommending SNF. Patient is medically stable today.   Consultants:  None  Procedures:  None  Antimicrobials: Ceftriaxone IV Cefadroxil   Subjective: Foot pain symptoms have significantly improved today.  Objective: BP (!) 157/93   Pulse 79   Temp 98.4 F (36.9 C)   Resp 20   Ht 5' 2.5" (1.588 m)   Wt 85.4 kg   SpO2 98%   BMI 33.89 kg/m   Examination:  General exam: Appears calm and comfortable Respiratory system: Clear to auscultation. Respiratory effort normal. Cardiovascular system: S1 & S2 heard, RRR. Gastrointestinal system: Abdomen is nondistended, soft and nontender. Normal bowel sounds heard. Central nervous system: Alert and oriented. No focal neurological deficits. Musculoskeletal: Trace/1+ bilateral foot edema. No calf tenderness Skin: No cyanosis. No rashes Psychiatry: Judgement and insight appear normal. Mood & affect appropriate.    Data Reviewed: I have personally reviewed following labs and imaging studies  CBC Lab Results  Component Value Date   WBC 11.9 (H) 10/30/2022   RBC 3.64 (L) 10/30/2022   HGB 11.0 (L) 10/30/2022   HCT 37.5 10/30/2022   MCV 103.0 (H)  10/30/2022   MCH 30.2 10/30/2022   PLT 246 10/30/2022   MCHC 29.3  (L) 10/30/2022   RDW 13.7 10/30/2022   LYMPHSABS 2.2 10/29/2022   MONOABS 1.0 10/29/2022   EOSABS 0.1 10/29/2022   BASOSABS 0.0 01/77/9390     Last metabolic panel Lab Results  Component Value Date   NA 143 10/30/2022   K 4.0 10/31/2022   CL 109 10/30/2022   CO2 24 10/30/2022   BUN 11 10/30/2022   CREATININE 0.94 10/30/2022   GLUCOSE 98 10/30/2022   GFRNONAA 58 (L) 10/30/2022   GFRAA 56 (L) 02/03/2021   CALCIUM 8.6 (L) 10/30/2022   PHOS 2.8 06/03/2019   PROT 6.3 (L) 10/28/2022   ALBUMIN 2.7 (L) 10/28/2022   LABGLOB 3.2 08/08/2018   AGRATIO 1.4 08/08/2018   BILITOT 1.1 10/28/2022   ALKPHOS 47 10/28/2022   AST 19 10/28/2022   ALT 14 10/28/2022   ANIONGAP 10 10/30/2022    GFR: Estimated Creatinine Clearance: 42.4 mL/min (by C-G formula based on SCr of 0.94 mg/dL).  Recent Results (from the past 240 hour(s))  Culture, blood (Routine x 2)     Status: None (Preliminary result)   Collection Time: 10/27/22  9:45 AM   Specimen: BLOOD  Result Value Ref Range Status   Specimen Description   Final    BLOOD RIGHT ANTECUBITAL Performed at [facility] [address] [place], [place] [address]    Special Requests   Final    BOTTLES DRAWN AEROBIC AND ANAEROBIC Blood Culture results may not be optimal due to an excessive volume of blood received in culture bottles Performed at [facility] [address], [place], [place] [address]    Culture   Final    NO GROWTH 4 DAYS Performed at [facility], [place] [address], [place], [place] [address]    Report Status PENDING  Incomplete  Resp Panel by RT-PCR (Flu A&B, Covid) Anterior Nasal Swab     Status: None   Collection Time: 10/27/22  9:51 AM   Specimen: Anterior Nasal Swab  Result Value Ref Range Status   SARS Coronavirus 2 by RT PCR NEGATIVE NEGATIVE Final    Comment: (NOTE) SARS-CoV-2 target nucleic acids are NOT DETECTED.  The SARS-CoV-2 RNA is generally detectable in upper  respiratory specimens during the acute phase of infection. The lowest concentration of SARS-CoV-2 viral copies this assay can detect is 138 copies/mL. A negative result does not preclude SARS-Cov-2 infection and should not be used as the sole basis for treatment or other patient management decisions. A negative result may occur with  improper specimen collection/handling, submission of specimen other than nasopharyngeal swab, presence of viral mutation(s) within the areas targeted by this assay, and inadequate number of viral copies(<138 copies/mL). A negative result must be combined with clinical observations, patient history, and epidemiological information. The expected result is Negative.  Fact Sheet for Patients:  EntrepreneurPulse.com.au  Fact Sheet for Healthcare Providers:  IncredibleEmployment.be  This test is no t yet approved or cleared by the [place] FDA and  has been authorized for detection and/or diagnosis of SARS-CoV-2 by FDA under an Emergency Use Authorization (EUA). This EUA will remain  in effect (meaning this test can be used) for the duration of the COVID-19 declaration under Section 564(b)(1) of the Act, 21 U.S.C.section 360bbb-3(b)(1), unless the authorization is terminated  or revoked sooner.       Influenza A by PCR NEGATIVE NEGATIVE Final   Influenza  B by PCR NEGATIVE NEGATIVE Final    Comment: (NOTE) The Xpert Xpress SARS-CoV-2/FLU/RSV plus assay is intended as an aid in the diagnosis of influenza from Nasopharyngeal swab specimens and should not be used as a sole basis for treatment. Nasal washings and aspirates are unacceptable for Xpert Xpress SARS-CoV-2/FLU/RSV testing.  Fact Sheet for Patients: EntrepreneurPulse.com.au  Fact Sheet for Healthcare Providers: IncredibleEmployment.be  This test is not yet approved or cleared by the [place] FDA and has been  authorized for detection and/or diagnosis of SARS-CoV-2 by FDA under an Emergency Use Authorization (EUA). This EUA will remain in effect (meaning this test can be used) for the duration of the COVID-19 declaration under Section 564(b)(1) of the Act, 21 U.S.C. section 360bbb-3(b)(1), unless the authorization is terminated or revoked.  Performed at [facility], [place] [address], [place], [place] [address]   Culture, blood (Routine x 2)     Status: None (Preliminary result)   Collection Time: 10/27/22  9:55 AM   Specimen: BLOOD LEFT HAND  Result Value Ref Range Status   Specimen Description   Final    BLOOD LEFT HAND Performed at [facility] [address], [place], [place] [address]    Special Requests   Final    BOTTLES DRAWN AEROBIC AND ANAEROBIC Blood Culture results may not be optimal due to an inadequate volume of blood received in culture bottles Performed at [facility] [address], [place], [place] [address]    Culture   Final    NO GROWTH 4 DAYS Performed at [facility], [place] [address], [place], [place] [address]    Report Status PENDING  Incomplete  Urine Culture     Status: Abnormal   Collection Time: 10/27/22 10:57 AM   Specimen: In/Out Cath Urine  Result Value Ref Range Status   Specimen Description   Final    IN/OUT CATH URINE Performed at [facility] [address] [place], [place] [address]    Special Requests   Final    NONE Performed at [facility], [place] [address], [place], [place] [address]    Culture 50,000 COLONIES/mL PROTEUS MIRABILIS (A)  Final   Report Status 10/29/2022 FINAL  Final   Organism ID, Bacteria PROTEUS MIRABILIS (A)  Final      Susceptibility   Proteus mirabilis - MIC*    AMPICILLIN <=2 SENSITIVE Sensitive     CEFAZOLIN 8 SENSITIVE Sensitive     CEFEPIME <=0.12 SENSITIVE Sensitive     CEFTRIAXONE <=0.25 SENSITIVE Sensitive      CIPROFLOXACIN <=0.25 SENSITIVE Sensitive     GENTAMICIN <=1 SENSITIVE Sensitive     IMIPENEM 2 SENSITIVE Sensitive     NITROFURANTOIN 128 RESISTANT Resistant     TRIMETH/SULFA <=20 SENSITIVE Sensitive     AMPICILLIN/SULBACTAM <=2 SENSITIVE Sensitive     PIP/TAZO <=4 SENSITIVE Sensitive     * 50,000 COLONIES/mL PROTEUS MIRABILIS  C Difficile Quick Screen w PCR reflex     Status: None   Collection Time: 10/28/22  3:00 PM   Specimen: STOOL  Result Value Ref Range Status   C Diff antigen NEGATIVE NEGATIVE Final   C Diff toxin NEGATIVE NEGATIVE Final   C Diff interpretation No C. difficile detected.  Final    Comment: Performed at [facility], [place] [address], [place],  [address]      Radiology Studies: DG Foot Complete Left  Result Date: 10/30/2022  CLINICAL DATA:  Left foot pain and swelling. EXAM: LEFT FOOT - COMPLETE 3+ VIEW COMPARISON:  Left foot radiographs 05/27/2020 FINDINGS: Minimal distal lateral cuboid degenerative spurring at the calcaneocuboid joint. Minimal joint space narrowing of the interphalangeal joints diffusely. Tiny plantar and posterior calcaneal heel spurs. No acute fracture or dislocation. No cortical erosion or periostitis. IMPRESSION: 1. Minimal osteoarthritis of the interphalangeal joints diffusely. 2. Tiny plantar and posterior calcaneal heel spurs. Electronically Signed   By: Yvonne Kendall M.D.   On: 10/30/2022 13:55   DG CHEST PORT 1 VIEW  Result Date: 10/30/2022 CLINICAL DATA:  [place] EXAM: PORTABLE CHEST 1 VIEW COMPARISON:  Radiograph 10/27/2022 FINDINGS: Unchanged cardiomediastinal silhouette. Low lung volumes. Bibasilar subsegmental atelectasis. Airspace consolidation. No pleural effusion or pneumothorax. No acute osseous abnormality. Thoracic spondylosis. Cervical spine fusion hardware noted. Bilateral shoulder degenerative changes. IMPRESSION: Low lung volumes with bibasilar subsegmental atelectasis. No airspace consolidation.  Electronically Signed   By: Maurine Simmering M.D.   On: 10/30/2022 11:59      LOS: 4 days    Cordelia Poche, MD [facility] 10/31/2022, 3:37 PM   If 7PM-7AM, please contact night-coverage [email or web address]

## 2022-10-31 NOTE — [place] FL2 (Signed)
[place] LEVEL OF CARE SCREENING TOOL     IDENTIFICATION  Patient Name: Eileen Wilkerson Birthdate: Dec 18, 1931 Sex: female Admission Date (Current Location): 10/27/2022  [facility] and [place] Number:  Herbalist and Address:  [facility],  [place], [place]      Provider Number: [id number]  Attending Physician Name and Address:  Mariel Aloe, MD  Relative Name and Phone Number:  Debrew,Jacqueline Daughter [phone]    Current Level of Care: Hospital Recommended Level of Care: [place] Prior Approval Number:    Date Approved/Denied:   PASRR Number: [id number]  Discharge Plan: SNF    Current Diagnoses: Patient Active Problem List   Diagnosis Date Noted   Sepsis secondary to UTI ([place]) 10/27/2022   DVT (deep venous thrombosis) ([place]) 05/26/2022   E coli bacteremia    Sepsis ([place]) 05/21/2022   Diarrhea 05/21/2022   Gait abnormality 01/26/2022   Sinus tachycardia 10/27/2021   Weight gain 10/14/2021   Hyperlipidemia 10/13/2021   Dysuria 06/23/2021   Osteopenia 06/07/2021   Adnexal cyst 03/03/2021   AK (actinic keratosis) 03/03/2021   UTI (urinary tract infection) 07/22/2020   Allergic rhinitis 07/22/2020   Asthma 07/08/2020   Perforation of sigmoid colon due to diverticulitis 06/23/2020   Hair loss 08/06/2019   SOB (shortness of breath) on exertion 08/06/2019   Pure hypercholesterolemia 08/06/2019   B12 deficiency 03/11/2018   E. coli UTI 01/18/2018   Edema of both lower extremities due to peripheral venous insufficiency 09/18/2017   Bilateral lower extremity edema 06/30/2017   Physical deconditioning 05/30/2017   Immunosuppressed status ([place]) 05/30/2017   Abnormal chest x-ray 05/17/2017   Hypokalemia 04/25/2017   Fibromyalgia 12/18/2016   DJD (degenerative joint disease), cervical 12/18/2016   Spondylosis of lumbar region without myelopathy or radiculopathy 12/18/2016   Trigger  finger, right middle finger 12/18/2016   Primary osteoarthritis of both feet 12/18/2016   Primary osteoarthritis of both knees 12/18/2016   GERD (gastroesophageal reflux disease) 12/18/2016   Age-related osteoporosis without current pathological fracture 12/18/2016   Rheumatoid arthritis ([place]) 12/28/2015   Long term current use of systemic steroids, for RA 12/28/2015   Lymphocytosis 11/13/2012    Orientation RESPIRATION BLADDER Height & Weight     Self, Time, Place  Normal Continent Weight: 85.4 kg Height:  5' 2.5" (158.8 cm)  BEHAVIORAL SYMPTOMS/MOOD NEUROLOGICAL BOWEL NUTRITION STATUS      Continent Diet (Regular)  AMBULATORY STATUS COMMUNICATION OF NEEDS Skin   Extensive Assist Verbally  (Ecchymosis right lower leg, Bilateral arms)                       Personal Care Assistance Level of Assistance  Bathing, Feeding, Dressing Bathing Assistance: Maximum assistance Feeding assistance: Limited assistance Dressing Assistance: Maximum assistance     Functional Limitations Info  Sight, Hearing, Speech Sight Info: Impaired Hearing Info: Impaired Speech Info: Adequate    SPECIAL CARE FACTORS FREQUENCY  PT (By licensed PT), OT (By licensed OT)     PT Frequency: x5 week OT Frequency: x5 week            Contractures Contractures Info: Not present    Additional Factors Info  Code Status, Allergies Code Status Info: FULL Allergies Info: Adhesive (Tape), Celebrex (Celecoxib), Ciprofibrate, Cymbalta (Duloxetine Hcl), Gabitril (Tiagabine), Lyrica (Pregabalin), Neurontin (Gabapentin), Nexium (Esomeprazole), Nsaids, Shrimp (Shellfish Allergy), Azactam (Aztreonam), Azelastine Hcl, Ciprofloxacin, Claritin (Loratadine), Methotrexate Derivatives, Nasacort (Triamcinolone), Olopatadine, Other, Sulfamethizole, Zantac (  Ranitidine Hcl), Claritin-d 12 Hour (Loratadine-pseudoephedrine Er), Keflex (Cephalexin), Penicillins, Sulfa Antibiotics           Current Medications  (10/31/2022):  This is the current hospital active medication list Current Facility-Administered Medications  Medication Dose Route Frequency Provider Last Rate Last Admin   acetaminophen (TYLENOL) tablet 650 mg  650 mg Oral Q6H PRN Reubin Milan, MD   650 mg at 10/31/22 2094   Or   acetaminophen (TYLENOL) suppository 650 mg  650 mg Rectal Q6H PRN Reubin Milan, MD       atorvastatin (LIPITOR) tablet 20 mg  20 mg Oral Daily Reubin Milan, MD   20 mg at 10/31/22 7096   cefadroxil (DURICEF) capsule 1,000 mg  1,000 mg Oral BID Mariel Aloe, MD       Chlorhexidine Gluconate Cloth 2 % PADS 6 each  6 each Topical Daily Mariel Aloe, MD   6 each at 10/30/22 1005   colchicine tablet 0.3 mg  0.3 mg Oral BID Mariel Aloe, MD   0.3 mg at 10/31/22 2836   cycloSPORINE (RESTASIS) 0.05 % ophthalmic emulsion 1 drop  1 drop Both Eyes BID Reubin Milan, MD   1 drop at 10/31/22 0919   enoxaparin (LOVENOX) injection 40 mg  40 mg Subcutaneous Q24H Reubin Milan, MD   40 mg at 10/30/22 2038   ipratropium-albuterol (DUONEB) 0.5-2.5 (3) MG/3ML nebulizer solution 3 mL  3 mL Nebulization Q4H PRN Reubin Milan, MD       loperamide (IMODIUM) capsule 2 mg  2 mg Oral PRN Mariel Aloe, MD   2 mg at 10/30/22 2247   loratadine (CLARITIN) tablet 10 mg  10 mg Oral Daily Reubin Milan, MD   10 mg at 10/31/22 6294   melatonin tablet 10 mg  10 mg Oral QHS Reubin Milan, MD   10 mg at 10/30/22 2036   montelukast (SINGULAIR) tablet 10 mg  10 mg Oral QHS Reubin Milan, MD   10 mg at 10/30/22 2036   ondansetron [facility]) tablet 4 mg  4 mg Oral Q6H PRN Reubin Milan, MD       Or   ondansetron [facility]) injection 4 mg  4 mg Intravenous Q6H PRN Reubin Milan, MD       Oral care mouth rinse  15 mL Mouth Rinse PRN Mariel Aloe, MD       pantoprazole (PROTONIX) EC tablet 40 mg  40 mg Oral Daily Reubin Milan, MD   40 mg at 10/31/22 7654   polyethylene glycol  (MIRALAX / GLYCOLAX) packet 17 g  17 g Oral Daily PRN Mariel Aloe, MD       predniSONE (DELTASONE) tablet 40 mg  40 mg Oral Q breakfast Mariel Aloe, MD   40 mg at 10/31/22 6503     Discharge Medications: Please see discharge summary for a list of discharge medications.  Relevant Imaging Results:  Relevant Lab Results:   Additional Information [phone]  Purcell Mouton, RN

## 2022-11-01 ENCOUNTER — Other Ambulatory Visit: Payer: Self-pay | Admitting: Orthopedic Surgery

## 2022-11-01 DIAGNOSIS — Z7401 Bed confinement status: Secondary | ICD-10-CM | POA: Diagnosis not present

## 2022-11-01 DIAGNOSIS — E538 Deficiency of other specified B group vitamins: Secondary | ICD-10-CM | POA: Diagnosis not present

## 2022-11-01 DIAGNOSIS — I5032 Chronic diastolic (congestive) heart failure: Secondary | ICD-10-CM | POA: Diagnosis not present

## 2022-11-01 DIAGNOSIS — I10 Essential (primary) hypertension: Secondary | ICD-10-CM | POA: Diagnosis not present

## 2022-11-01 DIAGNOSIS — I872 Venous insufficiency (chronic) (peripheral): Secondary | ICD-10-CM | POA: Diagnosis not present

## 2022-11-01 DIAGNOSIS — R7881 Bacteremia: Secondary | ICD-10-CM | POA: Diagnosis not present

## 2022-11-01 DIAGNOSIS — E785 Hyperlipidemia, unspecified: Secondary | ICD-10-CM | POA: Diagnosis not present

## 2022-11-01 DIAGNOSIS — K5720 Diverticulitis of large intestine with perforation and abscess without bleeding: Secondary | ICD-10-CM | POA: Diagnosis not present

## 2022-11-01 DIAGNOSIS — B9620 Unspecified Escherichia coli [E. coli] as the cause of diseases classified elsewhere: Secondary | ICD-10-CM | POA: Diagnosis not present

## 2022-11-01 DIAGNOSIS — N390 Urinary tract infection, site not specified: Secondary | ICD-10-CM | POA: Diagnosis not present

## 2022-11-01 DIAGNOSIS — M79604 Pain in right leg: Secondary | ICD-10-CM

## 2022-11-01 DIAGNOSIS — Z7952 Long term (current) use of systemic steroids: Secondary | ICD-10-CM | POA: Diagnosis not present

## 2022-11-01 DIAGNOSIS — E7800 Pure hypercholesterolemia, unspecified: Secondary | ICD-10-CM | POA: Diagnosis not present

## 2022-11-01 DIAGNOSIS — R197 Diarrhea, unspecified: Secondary | ICD-10-CM | POA: Diagnosis not present

## 2022-11-01 DIAGNOSIS — K219 Gastro-esophageal reflux disease without esophagitis: Secondary | ICD-10-CM | POA: Diagnosis not present

## 2022-11-01 DIAGNOSIS — M069 Rheumatoid arthritis, unspecified: Secondary | ICD-10-CM | POA: Diagnosis not present

## 2022-11-01 DIAGNOSIS — M797 Fibromyalgia: Secondary | ICD-10-CM | POA: Diagnosis not present

## 2022-11-01 DIAGNOSIS — J45909 Unspecified asthma, uncomplicated: Secondary | ICD-10-CM | POA: Diagnosis not present

## 2022-11-01 DIAGNOSIS — M8580 Other specified disorders of bone density and structure, unspecified site: Secondary | ICD-10-CM | POA: Diagnosis not present

## 2022-11-01 DIAGNOSIS — R000 Tachycardia, unspecified: Secondary | ICD-10-CM | POA: Diagnosis not present

## 2022-11-01 DIAGNOSIS — N949 Unspecified condition associated with female genital organs and menstrual cycle: Secondary | ICD-10-CM | POA: Diagnosis not present

## 2022-11-01 DIAGNOSIS — A419 Sepsis, unspecified organism: Secondary | ICD-10-CM | POA: Diagnosis not present

## 2022-11-01 DIAGNOSIS — M0579 Rheumatoid arthritis with rheumatoid factor of multiple sites without organ or systems involvement: Secondary | ICD-10-CM | POA: Diagnosis not present

## 2022-11-01 DIAGNOSIS — I82409 Acute embolism and thrombosis of unspecified deep veins of unspecified lower extremity: Secondary | ICD-10-CM | POA: Diagnosis not present

## 2022-11-01 DIAGNOSIS — E782 Mixed hyperlipidemia: Secondary | ICD-10-CM | POA: Diagnosis not present

## 2022-11-01 DIAGNOSIS — R531 Weakness: Secondary | ICD-10-CM | POA: Diagnosis not present

## 2022-11-01 DIAGNOSIS — M109 Gout, unspecified: Secondary | ICD-10-CM | POA: Diagnosis not present

## 2022-11-01 DIAGNOSIS — K5790 Diverticulosis of intestine, part unspecified, without perforation or abscess without bleeding: Secondary | ICD-10-CM | POA: Diagnosis not present

## 2022-11-01 LAB — CULTURE, BLOOD (ROUTINE X 2)
Culture: NO GROWTH
Culture: NO GROWTH

## 2022-11-01 MED ORDER — TRAMADOL HCL 100 MG PO TABS: 100.0000 mg | ORAL_TABLET | Freq: Every day | ORAL | 0 refills | Status: DC

## 2022-11-01 MED ORDER — TRAMADOL HCL 100 MG PO TABS: 50.0000 mg | ORAL_TABLET | Freq: Three times a day (TID) | ORAL | 0 refills | Status: DC | PRN

## 2022-11-01 NOTE — [place] Notes (Addendum)
Call placed x2, [phone]. No answer.unable to leave VM message.

## 2022-11-01 NOTE — TOC Progression Note (Addendum)
Transition of Care [facility]) - Progression Note    Patient Details  Name: Eileen Wilkerson MRN: [id number] Date of Birth: 08/23/1932  [facility] of [facility]) CM/SW Contact  Leeroy Cha, RN Phone Number: 11/01/2022, 10:26 AM  Clinical Narrative:     Larose [facility] at [facility]. Message left.  [facility]-katie-pt can go to room [address]. Number to call report is [phone]  Ptar called at 1300.  Expected Discharge Plan and Services                                                 Social Determinants of Health (SDOH) Interventions    Readmission Risk Interventions   Row Labels 05/30/2022   10:08 AM  Readmission Risk Prevention Plan   Section Header. No data exists in this row.   Transportation Screening   Complete  PCP or Specialist Appt within 5-7 Days   Complete  Home Care Screening   Complete  Medication Review (RN CM)   Complete

## 2022-11-01 NOTE — Discharge Summary (Signed)
Physician Discharge Summary   LEONORE FRANKSON DQQ:[id number] DOB: 1932/07/28 DOA: 10/27/2022  PCP: Mast, [name], NP  Admit date: 10/27/2022 Discharge date:   Barriers to discharge: none  Admitted From: home Disposition:  SNF Discharging physician: Dwyane Dee, MD  Recommendations for Outpatient Follow-up:  May need trial of stopping colchicine and start allopurinol if diarrhea persists  Home Health:  Equipment/Devices:   Discharge Condition: stable CODE STATUS: Partial - do not intubate  Diet recommendation:  Diet Orders (From admission, onward)     Start     Ordered   11/01/22 0000  Diet general        11/01/22 1234   10/28/22 1144  Diet regular Room service appropriate? Yes; Fluid consistency: Thin  Diet effective now       Question Answer Comment  Room service appropriate? Yes   Fluid consistency: Thin      10/28/22 1143            Hospital Course: Rosalee JESENYA BOWDITCH is a 88 y.o. female with a history of adrenal failure, rheumatoid arthritis on prednisone, asthma, diverticulosis, diverticulitis, recurrent UTI, E. coli bacteremia, fibromyalgia, GERD, HCAP, hyperlipidemia, osteoporosis, recurrent URI.  Patient presented secondary to decreased mentation with associated productive cough and fever.  When EMS arrived, patient was found to have hypoxia requiring administration of nonrebreather at 15 L/min.  On presentation to the emergency department, patient was found to have evidence of sepsis with concern for UTI source.  IV fluids and empiric antibiotics initiated on admission.  Chest imaging without evidence for acute infection.  Urine culture consistent with UTI.  Assessment and Plan:  Severe sepsis Present on admission. Associated lactic acid of 2.4 with a peak of 3.2 which is now resolved. UTI source. Empiric Ceftriaxone started on admission. IV fluids resuscitation given while in the ED.  Sepsis physiology improved.   UTI Urinalysis suggestive of likely UTI.  Urine culture (catheterized) obtained on 11/17. Empiric Ceftriaxone started on admission.  Preliminary urine culture significant for 50,000 colonies of proteus mirabilis -Discontinue Ceftriaxone and start Cefadroxil; course of abx completed prior to discharge    Diarrhea Unclear etiology.  Concern for possible C. Difficile but testing was negative.  Patient with continued diarrheal symptoms while inpatient. Appears to have improved. - may need trial of stopping colchicine and start allopurinol    Rheumatoid arthritis Patient is on prednisone 10 mg daily as an outpatient. Prednisone dose increased to 40 mg for gout flare -Resume prednisone 10 mg daily once treatment for gout flare completed   Gout - responded some to increase dose of steroid in hospital - resume home steroid dosing at discharge - continue colchicine; monitor for any worsening GI symptoms   GERD -Continue Protonix   Hyperlipidemia -Continue Lipitor   Productive cough - resolved  Appears patient has a history of recurrent upper respiratory infections.  No evidence of pneumonia on imaging.   Asthma - continue home regimen    History of DVT Not on chronic anticoagulation.  Patient was previously on Eliquis.  Daughter is concerned about recurrent DVT.  Patient reports some right calf pain. LE venous duplex negative for DVT bilaterally    The patient's chronic medical conditions were treated accordingly per the patient's home medication regimen except as noted.  On day of discharge, patient was felt deemed stable for discharge. Patient/family member advised to call PCP or come back to ER if needed.   Principal Diagnosis: Sepsis secondary to UTI [facility])  Discharge Diagnoses: Active  Hospital Problems   Diagnosis Date Noted   Sepsis secondary to UTI ([place]) 10/27/2022   DVT (deep venous thrombosis) ([place]) 05/26/2022   Diarrhea 05/21/2022   Asthma 07/08/2020   Pure hypercholesterolemia 08/06/2019   GERD (gastroesophageal  reflux disease) 12/18/2016   Rheumatoid arthritis ([place]) 12/28/2015    Resolved Hospital Problems  No resolved problems to display.     Discharge Instructions     Diet general   Complete by: As directed    Increase activity slowly   Complete by: As directed       Allergies as of 11/01/2022       Reactions   Adhesive [tape] Rash   Celebrex [celecoxib] Hives   Ciprofibrate Nausea Only   Cymbalta [duloxetine Hcl] Swelling   Gabitril [tiagabine] Swelling   Lyrica [pregabalin] Swelling   Neurontin [gabapentin] Swelling   Nexium [esomeprazole] Rash   Nsaids Rash   Shrimp [shellfish Allergy] Anaphylaxis   Per patient "shrimp only"   Azactam [aztreonam]    Hand swelling    Azelastine Hcl    Rash    Ciprofloxacin Other (See Comments)   dizziness   Claritin [loratadine]    Irritability Nervousness    Methotrexate Derivatives    Nasacort [triamcinolone]    Dizzy    Olopatadine Other (See Comments)   Pain and lethargy    Other    Sulfamethizole Other (See Comments)   unknown   Zantac [ranitidine Hcl] Other (See Comments)   unknown   Claritin-d 12 Hour [loratadine-pseudoephedrine Er] Anxiety   Keflex [cephalexin] Nausea And Vomiting   Tolerated Ancef   Penicillins Rash   Injection site reaction. Tolerated cefepime in past. Also reports tolerating a penicillin infusion after this initial rxn ~20 yrs ago.  Has patient had a PCN reaction causing immediate rash, facial/tongue/throat swelling, SOB or lightheadedness with hypotension: No Has patient had a PCN reaction causing severe rash involving mucus membranes or skin necrosis: No Has patient had a PCN reaction that required hospitalization No Has patient had a PCN reaction occurring within the last 10 years: No   Sulfa Antibiotics Nausea And Vomiting        Medication List     STOP taking these medications    apixaban 5 MG Tabs tablet Commonly known as: Eliquis   ketoconazole 2 % shampoo Commonly known as:  NIZORAL       TAKE these medications    atorvastatin 20 MG tablet Commonly known as: LIPITOR TAKE 1 TABLET BY MOUTH EVERY DAY   Biotin 5 MG Tbdp Take 1 tablet (5 mg total) by mouth daily.   colchicine 0.6 MG tablet TAKE 1 TABLET BY MOUTH EVERY DAY   CRANBERRY PO Take 1 capsule by mouth daily.   cyanocobalamin 1000 MCG tablet Commonly known as: VITAMIN B12 Take 1 tablet (1,000 mcg total) by mouth daily.   cycloSPORINE 0.05 % ophthalmic emulsion Commonly known as: RESTASIS Place 1 drop into both eyes 2 (two) times daily.   diclofenac Sodium 1 % Gel Commonly known as: VOLTAREN APPLY 2 TO 4 GRAMS TOPICALLY TO AFFECTED JOINTS UP TO 4 TIMES DAILY What changed:  how much to take how to take this when to take this reasons to take this   fexofenadine 180 MG tablet Commonly known as: ALLEGRA Take 1 tablet (180 mg total) by mouth daily.   furosemide 80 MG tablet Commonly known as: LASIX Take 1 tablet (80 mg total) by mouth daily.   lansoprazole 30 MG capsule Commonly known as:  PREVACID TAKE 1 CAPSULE BY MOUTH ONCE DAILY AT NOON What changed: See the new instructions.   Melatonin 10 MG Tabs Take 10 mg by mouth at bedtime.   montelukast 10 MG tablet Commonly known as: SINGULAIR TAKE 1 TABLET BY MOUTH EVERYDAY AT BEDTIME What changed: See the new instructions.   potassium chloride 10 MEQ CR capsule Commonly known as: MICRO-K Take 2 capsules (20 mEq total) by mouth 2 (two) times daily.   predniSONE 10 MG tablet Commonly known as: DELTASONE TAKE 1 TABLET (10 MG TOTAL) BY MOUTH DAILY WITH BREAKFAST.   traMADol HCl 100 MG Tabs Take 100 mg by mouth daily. What changed: Another medication with the same name was removed. Continue taking this medication, and follow the directions you see here.   Vitamin D3 125 MCG (5000 UT) Caps TAKE 1 CAPSULE BY MOUTH EVERY DAY        Allergies  Allergen Reactions   Adhesive [Tape] Rash   Celebrex [Celecoxib] Hives    Ciprofibrate Nausea Only   Cymbalta [Duloxetine Hcl] Swelling   Gabitril [Tiagabine] Swelling   Lyrica [Pregabalin] Swelling   Neurontin [Gabapentin] Swelling   Nexium [Esomeprazole] Rash   Nsaids Rash   Shrimp [Shellfish Allergy] Anaphylaxis    Per patient "shrimp only"   Azactam [Aztreonam]     Hand swelling    Azelastine Hcl     Rash    Ciprofloxacin Other (See Comments)    dizziness   Claritin [Loratadine]     Irritability Nervousness    Methotrexate Derivatives    Nasacort [Triamcinolone]     Dizzy    Olopatadine Other (See Comments)    Pain and lethargy    Other    Sulfamethizole Other (See Comments)    unknown   Zantac [Ranitidine Hcl] Other (See Comments)    unknown   Claritin-D 12 Hour [Loratadine-Pseudoephedrine Er] Anxiety   Keflex [Cephalexin] Nausea And Vomiting    Tolerated Ancef   Penicillins Rash    Injection site reaction. Tolerated cefepime in past. Also reports tolerating a penicillin infusion after this initial rxn ~20 yrs ago.  Has patient had a PCN reaction causing immediate rash, facial/tongue/throat swelling, SOB or lightheadedness with hypotension: No Has patient had a PCN reaction causing severe rash involving mucus membranes or skin necrosis: No Has patient had a PCN reaction that required hospitalization No Has patient had a PCN reaction occurring within the last 10 years: No     Sulfa Antibiotics Nausea And Vomiting    Consultations:   Procedures:   Discharge Exam: BP 134/80 (BP Location: Right Wrist)   Pulse 77   Temp 97.9 F (36.6 C) (Oral)   Resp 16   Ht 5' 2.5" (1.588 m)   Wt 85.4 kg   SpO2 95%   BMI 33.89 kg/m  Physical Exam Constitutional:      Appearance: Normal appearance.  HENT:     Head: Normocephalic and atraumatic.     Mouth/Throat:     Mouth: Mucous membranes are moist.  Eyes:     Extraocular Movements: Extraocular movements intact.  Cardiovascular:     Rate and Rhythm: Normal rate and regular rhythm.   Pulmonary:     Effort: Pulmonary effort is normal.     Breath sounds: Normal breath sounds.  Abdominal:     General: Bowel sounds are normal. There is no distension.     Palpations: Abdomen is soft.     Comments: Mild right sided TTP, no R/G  Musculoskeletal:  General: Normal range of motion.     Cervical back: Normal range of motion and neck supple.  Skin:    General: Skin is dry.  Neurological:     General: No focal deficit present.     Mental Status: She is alert.  Psychiatric:        Mood and Affect: Mood normal.        Behavior: Behavior normal.      The results of significant diagnostics from this hospitalization (including imaging, microbiology, ancillary and laboratory) are listed below for reference.   Microbiology: Recent Results (from the past 240 hour(s))  Culture, blood (Routine x 2)     Status: None   Collection Time: 10/27/22  9:45 AM   Specimen: BLOOD  Result Value Ref Range Status   Specimen Description   Final    BLOOD RIGHT ANTECUBITAL Performed at [facility] [address], [place], [place] [address]    Special Requests   Final    BOTTLES DRAWN AEROBIC AND ANAEROBIC Blood Culture results may not be optimal due to an excessive volume of blood received in culture bottles Performed at [facility] [address] [place], [place] [address]    Culture   Final    NO GROWTH 5 DAYS Performed at [facility], [place] [address] [place], [place] [address]    Report Status 11/01/2022 FINAL  Final  Resp Panel by RT-PCR (Flu A&B, Covid) Anterior Nasal Swab     Status: None   Collection Time: 10/27/22  9:51 AM   Specimen: Anterior Nasal Swab  Result Value Ref Range Status   SARS Coronavirus 2 by RT PCR NEGATIVE NEGATIVE Final    Comment: (NOTE) SARS-CoV-2 target nucleic acids are NOT DETECTED.  The SARS-CoV-2 RNA is generally detectable in upper respiratory specimens during the acute phase of infection. The  lowest concentration of SARS-CoV-2 viral copies this assay can detect is 138 copies/mL. A negative result does not preclude SARS-Cov-2 infection and should not be used as the sole basis for treatment or other patient management decisions. A negative result may occur with  improper specimen collection/handling, submission of specimen other than nasopharyngeal swab, presence of viral mutation(s) within the areas targeted by this assay, and inadequate number of viral copies(<138 copies/mL). A negative result must be combined with clinical observations, patient history, and epidemiological information. The expected result is Negative.  Fact Sheet for Patients:  EntrepreneurPulse.com.au  Fact Sheet for Healthcare Providers:  IncredibleEmployment.be  This test is no t yet approved or cleared by the [place] FDA and  has been authorized for detection and/or diagnosis of SARS-CoV-2 by FDA under an Emergency Use Authorization (EUA). This EUA will remain  in effect (meaning this test can be used) for the duration of the COVID-19 declaration under Section 564(b)(1) of the Act, 21 U.S.C.section 360bbb-3(b)(1), unless the authorization is terminated  or revoked sooner.       Influenza A by PCR NEGATIVE NEGATIVE Final   Influenza B by PCR NEGATIVE NEGATIVE Final    Comment: (NOTE) The Xpert Xpress SARS-CoV-2/FLU/RSV plus assay is intended as an aid in the diagnosis of influenza from Nasopharyngeal swab specimens and should not be used as a sole basis for treatment. Nasal washings and aspirates are unacceptable for Xpert Xpress SARS-CoV-2/FLU/RSV testing.  Fact Sheet for Patients: EntrepreneurPulse.com.au  Fact Sheet for Healthcare Providers: IncredibleEmployment.be  This test is not yet approved or cleared by the [place] FDA and has been  authorized for detection and/or diagnosis of SARS-CoV-2 by FDA under  an Emergency Use Authorization (EUA). This EUA will remain in effect (meaning this test can be used) for the duration of the COVID-19 declaration under Section 564(b)(1) of the Act, 21 U.S.C. section 360bbb-3(b)(1), unless the authorization is terminated or revoked.  Performed at [facility], [place] [address], [place], [place] [address]   Culture, blood (Routine x 2)     Status: None   Collection Time: 10/27/22  9:55 AM   Specimen: BLOOD LEFT HAND  Result Value Ref Range Status   Specimen Description   Final    BLOOD LEFT HAND Performed at [facility] [address], [place], [place] [address]    Special Requests   Final    BOTTLES DRAWN AEROBIC AND ANAEROBIC Blood Culture results may not be optimal due to an inadequate volume of blood received in culture bottles Performed at [facility] [address], [place], [place] [address]    Culture   Final    NO GROWTH 5 DAYS Performed at [facility], [place] [address] [place], [place] [address]    Report Status 11/01/2022 FINAL  Final  Urine Culture     Status: Abnormal   Collection Time: 10/27/22 10:57 AM   Specimen: In/Out Cath Urine  Result Value Ref Range Status   Specimen Description   Final    IN/OUT CATH URINE Performed at [facility] [address], [place], [place] [address]    Special Requests   Final    NONE Performed at [facility], [place] [address] [place], [place] [address]    Culture 50,000 COLONIES/mL PROTEUS MIRABILIS (A)  Final   Report Status 10/29/2022 FINAL  Final   Organism ID, Bacteria PROTEUS MIRABILIS (A)  Final      Susceptibility   Proteus mirabilis - MIC*    AMPICILLIN <=2 SENSITIVE Sensitive     CEFAZOLIN 8 SENSITIVE Sensitive     CEFEPIME <=0.12 SENSITIVE Sensitive     CEFTRIAXONE <=0.25 SENSITIVE Sensitive     CIPROFLOXACIN <=0.25 SENSITIVE Sensitive     GENTAMICIN <=1 SENSITIVE Sensitive      IMIPENEM 2 SENSITIVE Sensitive     NITROFURANTOIN 128 RESISTANT Resistant     TRIMETH/SULFA <=20 SENSITIVE Sensitive     AMPICILLIN/SULBACTAM <=2 SENSITIVE Sensitive     PIP/TAZO <=4 SENSITIVE Sensitive     * 50,000 COLONIES/mL PROTEUS MIRABILIS  C Difficile Quick Screen w PCR reflex     Status: None   Collection Time: 10/28/22  3:00 PM   Specimen: STOOL  Result Value Ref Range Status   C Diff antigen NEGATIVE NEGATIVE Final   C Diff toxin NEGATIVE NEGATIVE Final   C Diff interpretation No C. difficile detected.  Final    Comment: Performed at [facility], [place] [address], [place],  [address]     Labs: BNP (last 3 results) Recent Labs    02/27/22 1635  BNP 50.5   Basic Metabolic Panel: Recent Labs  Lab 10/27/22 0945 10/28/22 0357 10/29/22 0419 10/30/22 1216 10/31/22 0415  NA 139 143 144 143  --   K 3.3* 3.3* 2.9* 3.1* 4.0  CL 101 109 111 109  --   CO2 '26 27 24 24  '$ --   GLUCOSE 129* 152* 103* 98  --   BUN '10 10 13 11  '$ --   CREATININE 1.17* 1.05* 0.84 0.94  --  CALCIUM 9.4 8.9 8.8* 8.6*  --    Liver Function Tests: Recent Labs  Lab 10/27/22 0945 10/28/22 0357  AST 25 19  ALT 17 14  ALKPHOS 61 47  BILITOT 1.4* 1.1  PROT 7.6 6.3*  ALBUMIN 3.7 2.7*   No results for input(s): "LIPASE", "AMYLASE" in the last 168 hours. No results for input(s): "AMMONIA" in the last 168 hours. CBC: Recent Labs  Lab 10/27/22 0945 10/28/22 0357 10/29/22 0343 10/30/22 1216  WBC 18.6* 17.1* 13.6* 11.9*  NEUTROABS 12.7* 14.6* 10.1*  --   HGB 13.0 10.9* 9.8* 11.0*  HCT 41.9 35.8* 32.8* 37.5  MCV 97.2 98.6 99.7 103.0*  PLT 280 240 221 246   Cardiac Enzymes: No results for input(s): "CKTOTAL", "CKMB", "CKMBINDEX", "TROPONINI" in the last 168 hours. BNP: Invalid input(s): "POCBNP" CBG: No results for input(s): "GLUCAP" in the last 168 hours. D-Dimer No results for input(s): "DDIMER" in the last 72 hours. Hgb A1c No results for input(s): "HGBA1C"  in the last 72 hours. Lipid Profile No results for input(s): "CHOL", "HDL", "LDLCALC", "TRIG", "CHOLHDL", "LDLDIRECT" in the last 72 hours. Thyroid function studies No results for input(s): "TSH", "T4TOTAL", "T3FREE", "THYROIDAB" in the last 72 hours.  Invalid input(s): "FREET3" Anemia work up No results for input(s): "VITAMINB12", "FOLATE", "FERRITIN", "TIBC", "IRON", "RETICCTPCT" in the last 72 hours. Urinalysis    Component Value Date/Time   COLORURINE YELLOW 10/27/2022 1057   APPEARANCEUR CLOUDY (A) 10/27/2022 1057   LABSPEC <1.005 (L) 10/27/2022 1057   PHURINE >9.0 (H) 10/27/2022 1057   GLUCOSEU NEGATIVE 10/27/2022 1057   GLUCOSEU NEGATIVE 02/13/2018 1036   HGBUR MODERATE (A) 10/27/2022 1057   BILIRUBINUR NEGATIVE 10/27/2022 1057   BILIRUBINUR Negative 03/28/2021 1611   KETONESUR NEGATIVE 10/27/2022 1057   PROTEINUR 100 (A) 10/27/2022 1057   UROBILINOGEN negative (A) 03/28/2021 1611   UROBILINOGEN 0.2 02/13/2018 1036   NITRITE POSITIVE (A) 10/27/2022 1057   LEUKOCYTESUR MODERATE (A) 10/27/2022 1057   Sepsis Labs Recent Labs  Lab 10/27/22 0945 10/28/22 0357 10/29/22 0343 10/30/22 1216  WBC 18.6* 17.1* 13.6* 11.9*   Microbiology Recent Results (from the past 240 hour(s))  Culture, blood (Routine x 2)     Status: None   Collection Time: 10/27/22  9:45 AM   Specimen: BLOOD  Result Value Ref Range Status   Specimen Description   Final    BLOOD RIGHT ANTECUBITAL Performed at [facility] [address] [place], [place] [address]    Special Requests   Final    BOTTLES DRAWN AEROBIC AND ANAEROBIC Blood Culture results may not be optimal due to an excessive volume of blood received in culture bottles Performed at [facility] [address] [place], [place] [address]    Culture   Final    NO GROWTH 5 DAYS Performed at [facility], [place] [address] [place], [place] [address]    Report Status 11/01/2022 FINAL  Final   Resp Panel by RT-PCR (Flu A&B, Covid) Anterior Nasal Swab     Status: None   Collection Time: 10/27/22  9:51 AM   Specimen: Anterior Nasal Swab  Result Value Ref Range Status   SARS Coronavirus 2 by RT PCR NEGATIVE NEGATIVE Final    Comment: (NOTE) SARS-CoV-2 target nucleic acids are NOT DETECTED.  The SARS-CoV-2 RNA is generally detectable in upper respiratory specimens during the acute phase of infection. The lowest concentration of SARS-CoV-2 viral copies this assay can detect is 138 copies/mL. A negative  result does not preclude SARS-Cov-2 infection and should not be used as the sole basis for treatment or other patient management decisions. A negative result may occur with  improper specimen collection/handling, submission of specimen other than nasopharyngeal swab, presence of viral mutation(s) within the areas targeted by this assay, and inadequate number of viral copies(<138 copies/mL). A negative result must be combined with clinical observations, patient history, and epidemiological information. The expected result is Negative.  Fact Sheet for Patients:  EntrepreneurPulse.com.au  Fact Sheet for Healthcare Providers:  IncredibleEmployment.be  This test is no t yet approved or cleared by the [place] FDA and  has been authorized for detection and/or diagnosis of SARS-CoV-2 by FDA under an Emergency Use Authorization (EUA). This EUA will remain  in effect (meaning this test can be used) for the duration of the COVID-19 declaration under Section 564(b)(1) of the Act, 21 U.S.C.section 360bbb-3(b)(1), unless the authorization is terminated  or revoked sooner.       Influenza A by PCR NEGATIVE NEGATIVE Final   Influenza B by PCR NEGATIVE NEGATIVE Final    Comment: (NOTE) The Xpert Xpress SARS-CoV-2/FLU/RSV plus assay is intended as an aid in the diagnosis of influenza from Nasopharyngeal swab specimens and should not be used as a  sole basis for treatment. Nasal washings and aspirates are unacceptable for Xpert Xpress SARS-CoV-2/FLU/RSV testing.  Fact Sheet for Patients: EntrepreneurPulse.com.au  Fact Sheet for Healthcare Providers: IncredibleEmployment.be  This test is not yet approved or cleared by the [place] FDA and has been authorized for detection and/or diagnosis of SARS-CoV-2 by FDA under an Emergency Use Authorization (EUA). This EUA will remain in effect (meaning this test can be used) for the duration of the COVID-19 declaration under Section 564(b)(1) of the Act, 21 U.S.C. section 360bbb-3(b)(1), unless the authorization is terminated or revoked.  Performed at [facility], [place] [address] [place], [place] [address]   Culture, blood (Routine x 2)     Status: None   Collection Time: 10/27/22  9:55 AM   Specimen: BLOOD LEFT HAND  Result Value Ref Range Status   Specimen Description   Final    BLOOD LEFT HAND Performed at [facility] [address], [place], [place] [address]    Special Requests   Final    BOTTLES DRAWN AEROBIC AND ANAEROBIC Blood Culture results may not be optimal due to an inadequate volume of blood received in culture bottles Performed at [facility] [address], [place], [place] [address]    Culture   Final    NO GROWTH 5 DAYS Performed at [facility], [place] [address], [place], [place] [address]    Report Status 11/01/2022 FINAL  Final  Urine Culture     Status: Abnormal   Collection Time: 10/27/22 10:57 AM   Specimen: In/Out Cath Urine  Result Value Ref Range Status   Specimen Description   Final    IN/OUT CATH URINE Performed at [facility] [address] [place], [place] [address]    Special Requests   Final    NONE Performed at [facility], [place] [address] [place], [place] [address]    Culture 50,000  COLONIES/mL PROTEUS MIRABILIS (A)  Final   Report Status 10/29/2022 FINAL  Final   Organism ID, Bacteria PROTEUS MIRABILIS (A)  Final      Susceptibility   Proteus mirabilis - MIC*    AMPICILLIN <=2 SENSITIVE Sensitive  CEFAZOLIN 8 SENSITIVE Sensitive     CEFEPIME <=0.12 SENSITIVE Sensitive     CEFTRIAXONE <=0.25 SENSITIVE Sensitive     CIPROFLOXACIN <=0.25 SENSITIVE Sensitive     GENTAMICIN <=1 SENSITIVE Sensitive     IMIPENEM 2 SENSITIVE Sensitive     NITROFURANTOIN 128 RESISTANT Resistant     TRIMETH/SULFA <=20 SENSITIVE Sensitive     AMPICILLIN/SULBACTAM <=2 SENSITIVE Sensitive     PIP/TAZO <=4 SENSITIVE Sensitive     * 50,000 COLONIES/mL PROTEUS MIRABILIS  C Difficile Quick Screen w PCR reflex     Status: None   Collection Time: 10/28/22  3:00 PM   Specimen: STOOL  Result Value Ref Range Status   C Diff antigen NEGATIVE NEGATIVE Final   C Diff toxin NEGATIVE NEGATIVE Final   C Diff interpretation No C. difficile detected.  Final    Comment: Performed at [facility], [place] [address], [place], [place] [address]    Procedures/Studies: Darletta Moll Foot Complete Left  Result Date: 10/30/2022 CLINICAL DATA:  Left foot pain and swelling. EXAM: LEFT FOOT - COMPLETE 3+ VIEW COMPARISON:  Left foot radiographs 05/27/2020 FINDINGS: Minimal distal lateral cuboid degenerative spurring at the calcaneocuboid joint. Minimal joint space narrowing of the interphalangeal joints diffusely. Tiny plantar and posterior calcaneal heel spurs. No acute fracture or dislocation. No cortical erosion or periostitis. IMPRESSION: 1. Minimal osteoarthritis of the interphalangeal joints diffusely. 2. Tiny plantar and posterior calcaneal heel spurs. Electronically Signed   By: Yvonne Kendall M.D.   On: 10/30/2022 13:55   DG CHEST PORT 1 VIEW  Result Date: 10/30/2022 CLINICAL DATA:  [place] EXAM: PORTABLE CHEST 1 VIEW COMPARISON:  Radiograph 10/27/2022 FINDINGS: Unchanged cardiomediastinal silhouette.  Low lung volumes. Bibasilar subsegmental atelectasis. Airspace consolidation. No pleural effusion or pneumothorax. No acute osseous abnormality. Thoracic spondylosis. Cervical spine fusion hardware noted. Bilateral shoulder degenerative changes. IMPRESSION: Low lung volumes with bibasilar subsegmental atelectasis. No airspace consolidation. Electronically Signed   By: Maurine Simmering M.D.   On: 10/30/2022 11:59   VAS [place] LOWER EXTREMITY VENOUS (DVT)  Result Date: 10/28/2022  Lower Venous DVT Study Patient Name:  LACARA DUNSWORTH  Date of Exam:   10/28/2022 Medical Rec #: [id number]           Accession #:    [id number] Date of Birth: [birth year]           Patient Gender: F Patient Age:   88 years Exam Location:  [facility] Procedure:      VAS [place] LOWER EXTREMITY VENOUS (DVT) Referring Phys: RALPH NETTEY --------------------------------------------------------------------------------  Indications: RLE pain.  Risk Factors: DVT 05/2022 RLE. Limitations: Poor ultrasound/tissue interface. Comparison Study: Previous exam on 05/26/22 was positive for DVT in RLE posterior                   tibial vein. Performing Technologist: Rogelia Rohrer RVT, RDMS  Examination Guidelines: A complete evaluation includes B-mode imaging, spectral Doppler, color Doppler, and power Doppler as needed of all accessible portions of each vessel. Bilateral testing is considered an integral part of a complete examination. Limited examinations for reoccurring indications may be performed as noted. The reflux portion of the exam is performed with the patient in reverse Trendelenburg.  +---------+---------------+---------+-----------+----------+--------------+ RIGHT    CompressibilityPhasicitySpontaneityPropertiesThrombus Aging +---------+---------------+---------+-----------+----------+--------------+ CFV      Full           Yes      Yes                                  +---------+---------------+---------+-----------+----------+--------------+  SFJ      Full                                                        +---------+---------------+---------+-----------+----------+--------------+ FV Prox  Full           Yes      Yes                                 +---------+---------------+---------+-----------+----------+--------------+ FV Mid   Full           Yes      Yes                                 +---------+---------------+---------+-----------+----------+--------------+ FV DistalFull           Yes      Yes                                 +---------+---------------+---------+-----------+----------+--------------+ PFV      Full                                                        +---------+---------------+---------+-----------+----------+--------------+ POP      Full           Yes      Yes                                 +---------+---------------+---------+-----------+----------+--------------+ PTV      Full                                                        +---------+---------------+---------+-----------+----------+--------------+ PERO     Full                                                        +---------+---------------+---------+-----------+----------+--------------+   +---------+---------------+---------+-----------+----------+--------------+ LEFT     CompressibilityPhasicitySpontaneityPropertiesThrombus Aging +---------+---------------+---------+-----------+----------+--------------+ CFV      Full           Yes      Yes                                 +---------+---------------+---------+-----------+----------+--------------+ SFJ      Full                                                        +---------+---------------+---------+-----------+----------+--------------+  FV Prox  Full           Yes      Yes                                  +---------+---------------+---------+-----------+----------+--------------+ FV Mid   Full           Yes      Yes                                 +---------+---------------+---------+-----------+----------+--------------+ FV DistalFull           Yes      Yes                                 +---------+---------------+---------+-----------+----------+--------------+ PFV      Full                                                        +---------+---------------+---------+-----------+----------+--------------+ POP      Full           Yes      Yes                                 +---------+---------------+---------+-----------+----------+--------------+ PTV      Full                                                        +---------+---------------+---------+-----------+----------+--------------+ PERO     Full                                                        +---------+---------------+---------+-----------+----------+--------------+     Summary: BILATERAL: - No evidence of deep vein thrombosis seen in the lower extremities, bilaterally. -No evidence of popliteal cyst, bilaterally.   *See table(s) above for measurements and observations. Electronically signed by Deitra Mayo MD on 10/28/2022 at 3:17:57 PM.    Final    DG Chest 2 View  Result Date: 10/27/2022 CLINICAL DATA:  Altered mental status. Hypoxia. Productive cough. Suspected sepsis. EXAM: CHEST - 2 VIEW COMPARISON:  05/21/2022 FINDINGS: The heart size and mediastinal contours are within normal limits. Mild elevation of left hemidiaphragm is stable. Both lungs are clear. Cervical spine fusion hardware again noted. IMPRESSION: Stable exam. No active cardiopulmonary disease. Electronically Signed   By: Marlaine Hind M.D.   On: 10/27/2022 10:42     Time coordinating discharge: Over 30 minutes    Dwyane Dee, MD  [facility] 11/01/2022, 12:34 PM

## 2022-11-01 NOTE — Progress Notes (Signed)
PTAR arrived, pt is stable, no change from am assessment. Pt is a&ox4, out of bed 1 assist.

## 2022-11-01 NOTE — Progress Notes (Signed)
Mobility Specialist - Progress Note   11/01/22 1158  Mobility  Activity Ambulated with assistance in hallway;Ambulated with assistance to bathroom  Level of Assistance Standby assist, set-up cues, supervision of patient - no hands on  Assistive Device Front wheel walker  Distance Ambulated (ft) 20 ft  Range of Motion/Exercises Active  Activity Response Tolerated well  Mobility Referral Yes  $Mobility charge 1 Mobility   Pt was found in bed and agreeable to ambulate short distance. Stated that when she walks that the bottom of her feet feel as though she is walking on seashells not eggshells. When she got up and stated feeling dizzy and when asked to sit back down said that it was going away. When returning to room she ambulated to the bathroom and when getting up again stated feeling dizzy but it going away. At EOS was left with RN in bathroom.  Ferd Hibbs Mobility Specialist

## 2022-11-01 NOTE — Progress Notes (Signed)
Call received Eileen Wilkerson to get report, No answer when called x 2

## 2022-11-06 ENCOUNTER — Non-Acute Institutional Stay (SKILLED_NURSING_FACILITY): Payer: Medicare PPO | Admitting: Nurse Practitioner

## 2022-11-06 ENCOUNTER — Encounter: Payer: Self-pay | Admitting: Nurse Practitioner

## 2022-11-06 DIAGNOSIS — M0579 Rheumatoid arthritis with rheumatoid factor of multiple sites without organ or systems involvement: Secondary | ICD-10-CM

## 2022-11-06 DIAGNOSIS — I824Y9 Acute embolism and thrombosis of unspecified deep veins of unspecified proximal lower extremity: Secondary | ICD-10-CM

## 2022-11-06 DIAGNOSIS — M109 Gout, unspecified: Secondary | ICD-10-CM

## 2022-11-06 DIAGNOSIS — K5720 Diverticulitis of large intestine with perforation and abscess without bleeding: Secondary | ICD-10-CM

## 2022-11-06 DIAGNOSIS — J45909 Unspecified asthma, uncomplicated: Secondary | ICD-10-CM | POA: Diagnosis not present

## 2022-11-06 DIAGNOSIS — M8580 Other specified disorders of bone density and structure, unspecified site: Secondary | ICD-10-CM

## 2022-11-06 DIAGNOSIS — E782 Mixed hyperlipidemia: Secondary | ICD-10-CM | POA: Diagnosis not present

## 2022-11-06 DIAGNOSIS — K219 Gastro-esophageal reflux disease without esophagitis: Secondary | ICD-10-CM | POA: Diagnosis not present

## 2022-11-06 DIAGNOSIS — I5032 Chronic diastolic (congestive) heart failure: Secondary | ICD-10-CM | POA: Diagnosis not present

## 2022-11-06 DIAGNOSIS — I872 Venous insufficiency (chronic) (peripheral): Secondary | ICD-10-CM

## 2022-11-06 DIAGNOSIS — R000 Tachycardia, unspecified: Secondary | ICD-10-CM | POA: Diagnosis not present

## 2022-11-06 DIAGNOSIS — E538 Deficiency of other specified B group vitamins: Secondary | ICD-10-CM

## 2022-11-06 DIAGNOSIS — N949 Unspecified condition associated with female genital organs and menstrual cycle: Secondary | ICD-10-CM

## 2022-11-06 NOTE — Assessment & Plan Note (Signed)
on Vit B12 po, Vit B12 235 06/20/21 

## 2022-11-06 NOTE — Assessment & Plan Note (Signed)
off acid reducer. Hgb 11.0 10/30/22 

## 2022-11-06 NOTE — Assessment & Plan Note (Signed)
Hx of DVT, venous US BLE was negative for DVT while in hospital. Off Eliquis.

## 2022-11-06 NOTE — Assessment & Plan Note (Signed)
takes Atorvastatin  

## 2022-11-06 NOTE — Assessment & Plan Note (Signed)
heart rate is in control.

## 2022-11-06 NOTE — Progress Notes (Signed)
Location:   SNF [place] Room Number: NO/35/A Place of Service:  SNF (31) Provider: Lennie Odor Aerabella Galasso NP  Kriti Katayama X, NP  Patient Care Team: Lorenza Winkleman X, [name]  Extended Emergency Contact Information Primary Emergency Contact: [name] Phone: [phone] Mobile Phone: [phone] Relation: Daughter  Code Status: DNR Goals of care: Advanced Directive information    11/06/2022    2:50 PM  Advanced Directives  Does Patient Have a Medical Advance Directive? Yes  Type of Paramedic of [place];Living will  Does patient want to make changes to medical advance directive? No - Patient declined  Copy of [place] in Chart? No - copy requested     Chief Complaint  Patient presents with   Acute Visit    Patient is here for follow up after hospital stay     HPI:  Pt is a 88 y.o. female seen today for an acute visit for follow up hospital stay.   Hospitalized 10/27/22-11/22/3 for sepsis, UTI, fully treated with IVF, antibiotics, her mentation has returned to her baseline, she is afebrile, CXR no evidence of acute infection.    Chronic diastolic heart failure, DOE, on and off wheezing, cough, occasional yellow phlegm in am. CXR no evidence of acute infection 10/2022. EF 60-65% 05/22/22             Hx of sinus tachycardia, heart rate is in control.              Hx of aute sigmoid diverticulitis with pneumoperitoneum, f/u Dr. [name], diarrhea on and off, negative C-diff while in hospital.              Asthma Epinephrine prn, Allegra, Flonase, Singulair             RA/fibromyalgia,  takes Prednisone, Tramadol, TSH 0.865 02/27/22             GERD off acid reducer. Hgb 11.0 10/30/22             Venous insufficiency/Edema , takes Furosemide, Kcl. Bun/creat 11/0.94 10/30/22             OP Rheumatology, off Alendronate, on Vit D, t score -1.8 06/06/21              Hyperlipidemia, takes Atorvastatin             Vit B12 deficiency, on Vit B12 po, Vit B12 235 06/20/21             CT left adnexa cyst 11/2020, MRI 05/08/21,  05/22/22 renal US shoed No evidence of renal mass or hydronephrosis.   Gout, R ankle, stable, takes Colchicine  Hx of DVT, venous US BLE was negative for DVT while in hospital. Off Eliquis.   Past Medical History:  Diagnosis Date   Adrenal failure ([place])    Arthritis    Asthma    Cancer ([place])    Cataract    Closed nondisplaced fracture of fifth right metatarsal bone 09/18/2017   Diverticulitis    Per patient   E coli bacteremia    Fibromyalgia 2008   GERD (gastroesophageal reflux disease) 12/18/2016   HCAP (healthcare-associated pneumonia) 02/03/2018   Hyperlipidemia 10/13/2021   Osteoporosis    RA (rheumatoid arthritis) ([place])    Recurrent upper respiratory infection (URI)    Sepsis due to urinary tract infection ([place]) 01/18/2018   Urticaria    Past Surgical History:  Procedure Laterality Date   BACK SURGERY     BILATERAL CARPAL TUNNEL RELEASE  2005   right and left   CATARACT EXTRACTION, BILATERAL  2004   right and left   CERVICAL FUSION  2011,2010,2008   2 disks   HEEL SPUR SURGERY  2004   lower back fusion  2011   Fusion of 3-4 and 4-5 lower back   RADIOFREQUENCY ABLATION  2020   ROTATOR CUFF REPAIR  [id number]   SQUAMOUS CELL CARCINOMA EXCISION     TONSILLECTOMY AND ADENOIDECTOMY  1947   TOTAL SHOULDER ARTHROPLASTY      Allergies  Allergen Reactions   Adhesive [Tape] Rash   Celebrex [Celecoxib] Hives   Ciprofibrate Nausea Only   Cymbalta [Duloxetine Hcl] Swelling   Gabitril [Tiagabine] Swelling   Lyrica [Pregabalin] Swelling   Neurontin [Gabapentin] Swelling   Nexium [Esomeprazole] Rash   Nsaids Rash   Shrimp [Shellfish Allergy] Anaphylaxis    Per patient "shrimp only"   Azactam [Aztreonam]     Hand swelling    Azelastine Hcl     Rash    Ciprofloxacin Other (See Comments)    dizziness   Claritin  [Loratadine]     Irritability Nervousness    Methotrexate Derivatives    Nasacort [Triamcinolone]     Dizzy    Olopatadine Other (See Comments)    Pain and lethargy    Other    Sulfamethizole Other (See Comments)    unknown   Zantac [Ranitidine Hcl] Other (See Comments)    unknown   Claritin-D 12 Hour [Loratadine-Pseudoephedrine Er] Anxiety   Keflex [Cephalexin] Nausea And Vomiting    Tolerated Ancef   Penicillins Rash    Injection site reaction. Tolerated cefepime in past. Also reports tolerating a penicillin infusion after this initial rxn ~20 yrs ago.  Has patient had a PCN reaction causing immediate rash, facial/tongue/throat swelling, SOB or lightheadedness with hypotension: No Has patient had a PCN reaction causing severe rash involving mucus membranes or skin necrosis: No Has patient had a PCN reaction that required hospitalization No Has patient had a PCN reaction occurring within the last 10 years: No     Sulfa Antibiotics Nausea And Vomiting    Allergies as of 11/06/2022       Reactions   Adhesive [tape] Rash   Celebrex [celecoxib] Hives   Ciprofibrate Nausea Only   Cymbalta [duloxetine Hcl] Swelling   Gabitril [tiagabine] Swelling   Lyrica [pregabalin] Swelling   Neurontin [gabapentin] Swelling   Nexium [esomeprazole] Rash   Nsaids Rash   Shrimp [shellfish Allergy] Anaphylaxis   Per patient "shrimp only"   Azactam [aztreonam]    Hand swelling    Azelastine Hcl    Rash    Ciprofloxacin Other (See Comments)   dizziness   Claritin [loratadine]    Irritability Nervousness    Methotrexate Derivatives    Nasacort [triamcinolone]    Dizzy    Olopatadine Other (See Comments)   Pain and lethargy    Other    Sulfamethizole Other (See Comments)   unknown   Zantac [ranitidine Hcl] Other (See Comments)   unknown   Claritin-d 12 Hour [loratadine-pseudoephedrine Er] Anxiety   Keflex [cephalexin] Nausea And Vomiting   Tolerated Ancef   Penicillins Rash    Injection site reaction. Tolerated cefepime in past. Also reports tolerating a penicillin infusion after this initial rxn ~20 yrs ago.  Has patient had a PCN reaction causing immediate rash, facial/tongue/throat swelling, SOB or lightheadedness with hypotension: No  Has patient had a PCN reaction causing severe rash involving mucus membranes or skin necrosis: No Has patient had a PCN reaction that required hospitalization No Has patient had a PCN reaction occurring within the last 10 years: No   Sulfa Antibiotics Nausea And Vomiting        Medication List        Accurate as of November 06, 2022  3:54 PM. If you have any questions, ask your nurse or doctor.          atorvastatin 20 MG tablet Commonly known as: LIPITOR TAKE 1 TABLET BY MOUTH EVERY DAY   Biotin 5 MG Tbdp Take 1 tablet (5 mg total) by mouth daily.   colchicine 0.6 MG tablet TAKE 1 TABLET BY MOUTH EVERY DAY   CRANBERRY PO Take 1 capsule by mouth daily.   cyanocobalamin 1000 MCG tablet Commonly known as: VITAMIN B12 Take 1 tablet (1,000 mcg total) by mouth daily.   cycloSPORINE 0.05 % ophthalmic emulsion Commonly known as: RESTASIS Place 1 drop into both eyes 2 (two) times daily.   diclofenac Sodium 1 % Gel Commonly known as: VOLTAREN APPLY 2 TO 4 GRAMS TOPICALLY TO AFFECTED JOINTS UP TO 4 TIMES DAILY What changed:  how much to take how to take this when to take this reasons to take this   fexofenadine 180 MG tablet Commonly known as: ALLEGRA Take 1 tablet (180 mg total) by mouth daily.   furosemide 80 MG tablet Commonly known as: LASIX Take 1 tablet (80 mg total) by mouth daily.   lansoprazole 30 MG capsule Commonly known as: PREVACID TAKE 1 CAPSULE BY MOUTH ONCE DAILY AT NOON What changed: See the new instructions.   Melatonin 10 MG Tabs Take 10 mg by mouth at bedtime.   montelukast 10 MG tablet Commonly known as: SINGULAIR TAKE 1 TABLET BY MOUTH EVERYDAY AT BEDTIME What changed: See  the new instructions.   potassium chloride 10 MEQ CR capsule Commonly known as: MICRO-K Take 2 capsules (20 mEq total) by mouth 2 (two) times daily.   predniSONE 10 MG tablet Commonly known as: DELTASONE TAKE 1 TABLET (10 MG TOTAL) BY MOUTH DAILY WITH BREAKFAST.   traMADol HCl 100 MG Tabs Take 50 mg by mouth 3 (three) times daily as needed for up to 14 days.   Vitamin D3 125 MCG (5000 UT) Caps TAKE 1 CAPSULE BY MOUTH EVERY DAY        Review of Systems  Constitutional:  Positive for fatigue. Negative for appetite change and fever.  HENT:  Positive for postnasal drip. Negative for congestion, sinus pressure and sore throat.   Eyes:  Negative for visual disturbance.  Respiratory:  Positive for cough, shortness of breath and wheezing. Negative for chest tightness.        DOE, occasional yellow phlegm in am, hacking cough.   Cardiovascular:  Positive for leg swelling.  Gastrointestinal:  Negative for abdominal pain and constipation.       On and off diarrhea.   Genitourinary:  Negative for dysuria and urgency.       Incontinent of urine.   Musculoskeletal:  Positive for arthralgias, back pain, gait problem and myalgias.       Right lower back hip pain, travels down to the right leg.  Skin:  Positive for wound. Negative for color change.  Neurological:  Negative for speech difficulty, weakness and light-headedness.  Psychiatric/Behavioral:  Negative for confusion and sleep disturbance. The patient is not nervous/anxious.  Immunization History  Administered Date(s) Administered   Fluad Quad(high Dose 65+) 09/30/2020   Influenza, High Dose Seasonal PF 08/16/2018, 09/08/2019, 09/24/2020, 10/04/2021   Influenza,inj,Quad PF,6+ Mos 08/11/2016   Influenza-Unspecified 11/12/2017   Moderna Covid-19 Vaccine Bivalent Booster 87yr & up 10/04/2021   Moderna Sars-Covid-2 Vaccination 12/15/2019, 01/12/2020, 10/19/2020   PNEUMOCOCCAL CONJUGATE-20 10/04/2021   PPD Test 03/01/2018    Pneumococcal Conjugate-13 06/26/2017   Pneumococcal Polysaccharide-23 12/11/2009   Zoster Recombinat (Shingrix) 01/28/2019, 09/02/2019   Pertinent  Health Maintenance Due  Topic Date Due   INFLUENZA VACCINE  07/11/2022   DEXA SCAN  Completed      10/30/2022   10:04 AM 10/30/2022    8:30 PM 10/31/2022    9:18 AM 10/31/2022    8:00 PM 11/01/2022    7:45 AM  Fall Risk  Patient Fall Risk Level High fall risk High fall risk High fall risk High fall risk High fall risk   Functional Status Survey:    Vitals:   11/06/22 1412  BP: 113/62  Pulse: 86  Resp: 18  Temp: 97.6 F (36.4 C)  SpO2: 95%  Weight: 179 lb 4.8 oz (81.3 kg)   Body mass index is 32.27 kg/m. Physical Exam Vitals and nursing note reviewed.  Constitutional:      Appearance: Normal appearance.  HENT:     Head: Normocephalic and atraumatic.     Ears:     Comments: A small abrasion from hearing aid at the opening of the right external ear canal, not infected.     Nose: Nose normal. No rhinorrhea.     Mouth/Throat:     Mouth: Mucous membranes are moist.     Pharynx: No oropharyngeal exudate or posterior oropharyngeal erythema.  Eyes:     Extraocular Movements: Extraocular movements intact.     Conjunctiva/sclera: Conjunctivae normal.     Pupils: Pupils are equal, round, and reactive to light.  Cardiovascular:     Rate and Rhythm: Normal rate and regular rhythm.     Heart sounds: No murmur heard. Pulmonary:     Effort: Pulmonary effort is normal.     Breath sounds: No rales.  Abdominal:     General: Bowel sounds are normal.     Palpations: Abdomen is soft.     Tenderness: There is no abdominal tenderness.  Musculoskeletal:     Cervical back: Normal range of motion and neck supple.     Right lower leg: Edema present.     Left lower leg: Edema present.     Comments: Trace edema BLE.   Skin:    General: Skin is warm and dry.     Findings: Bruising present.     Comments: Lots of moles. Right upper arm  abrasion, no active bleeding or s/s of infection.   Neurological:     General: No focal deficit present.     Mental Status: She is alert and oriented to person, place, and time. Mental status is at baseline.     Gait: Gait abnormal.  Psychiatric:        Mood and Affect: Mood normal.        Behavior: Behavior normal.        Thought Content: Thought content normal.        Judgment: Judgment normal.     Labs reviewed: Recent Labs    05/28/22 0551 05/29/22 0419 05/30/22 0514 10/27/22 0945 10/28/22 0357 10/29/22 0419 10/30/22 1216 10/31/22 0415  NA 140 140 143   < >  143 144 143  --   K 3.3* 4.1 3.9   < > 3.3* 2.9* 3.1* 4.0  CL 99 102 104   < > 109 111 109  --   CO2 '30 29 30   '$ < > '27 24 24  '$ --   GLUCOSE 114* 178* 157*   < > 152* 103* 98  --   BUN 9 20 24*   < > '10 13 11  '$ --   CREATININE 0.83 0.85 0.87   < > 1.05* 0.84 0.94  --   CALCIUM 9.5 9.5 9.1   < > 8.9 8.8* 8.6*  --   MG 2.0 2.3 2.2  --   --   --   --   --    < > = values in this interval not displayed.   Recent Labs    05/27/22 0543 10/27/22 0945 10/28/22 0357  AST 14* 25 19  ALT '10 17 14  '$ ALKPHOS 49 61 47  BILITOT 0.8 1.4* 1.1  PROT 6.1* 7.6 6.3*  ALBUMIN 2.9* 3.7 2.7*   Recent Labs    10/27/22 0945 10/28/22 0357 10/29/22 0343 10/30/22 1216  WBC 18.6* 17.1* 13.6* 11.9*  NEUTROABS 12.7* 14.6* 10.1*  --   HGB 13.0 10.9* 9.8* 11.0*  HCT 41.9 35.8* 32.8* 37.5  MCV 97.2 98.6 99.7 103.0*  PLT 280 240 221 246   Lab Results  Component Value Date   TSH 0.865 02/27/2022   No results found for: "HGBA1C" Lab Results  Component Value Date   CHOL 176 04/06/2020   HDL 67 04/06/2020   LDLCALC 82 04/06/2020   TRIG 171 (H) 04/06/2020   CHOLHDL 2.6 04/06/2020    Significant Diagnostic Results in last 30 days:  DG Foot Complete Left  Result Date: 10/30/2022 CLINICAL DATA:  Left foot pain and swelling. EXAM: LEFT FOOT - COMPLETE 3+ VIEW COMPARISON:  Left foot radiographs 05/27/2020 FINDINGS: Minimal distal  lateral cuboid degenerative spurring at the calcaneocuboid joint. Minimal joint space narrowing of the interphalangeal joints diffusely. Tiny plantar and posterior calcaneal heel spurs. No acute fracture or dislocation. No cortical erosion or periostitis. IMPRESSION: 1. Minimal osteoarthritis of the interphalangeal joints diffusely. 2. Tiny plantar and posterior calcaneal heel spurs. Electronically Signed   By: Yvonne Kendall M.D.   On: 10/30/2022 13:55   DG CHEST PORT 1 VIEW  Result Date: 10/30/2022 CLINICAL DATA:  Rales EXAM: PORTABLE CHEST 1 VIEW COMPARISON:  Radiograph 10/27/2022 FINDINGS: Unchanged cardiomediastinal silhouette. Low lung volumes. Bibasilar subsegmental atelectasis. Airspace consolidation. No pleural effusion or pneumothorax. No acute osseous abnormality. Thoracic spondylosis. Cervical spine fusion hardware noted. Bilateral shoulder degenerative changes. IMPRESSION: Low lung volumes with bibasilar subsegmental atelectasis. No airspace consolidation. Electronically Signed   By: Maurine Simmering M.D.   On: 10/30/2022 11:59   VAS [place] LOWER EXTREMITY VENOUS (DVT)  Result Date: 10/28/2022  Lower Venous DVT Study Patient Name:  Eileen Wilkerson  Date of Exam:   10/28/2022 Medical Rec #: [id number]           Accession #:    [id number] Date of Birth: [birth year]           Patient Gender: F Patient Age:   54 years Exam Location:  [facility] Procedure:      VAS [place] LOWER EXTREMITY VENOUS (DVT) Referring Phys: RALPH NETTEY --------------------------------------------------------------------------------  Indications: RLE pain.  Risk Factors: DVT 05/2022 RLE. Limitations: Poor ultrasound/tissue interface. Comparison Study: Previous exam on 05/26/22 was positive for DVT in RLE  posterior                   tibial vein. Performing Technologist: Rogelia Rohrer RVT, RDMS  Examination Guidelines: A complete evaluation includes B-mode imaging, spectral Doppler, color Doppler, and power Doppler as needed of all  accessible portions of each vessel. Bilateral testing is considered an integral part of a complete examination. Limited examinations for reoccurring indications may be performed as noted. The reflux portion of the exam is performed with the patient in reverse Trendelenburg.  +---------+---------------+---------+-----------+----------+--------------+ RIGHT    CompressibilityPhasicitySpontaneityPropertiesThrombus Aging +---------+---------------+---------+-----------+----------+--------------+ CFV      Full           Yes      Yes                                 +---------+---------------+---------+-----------+----------+--------------+ SFJ      Full                                                        +---------+---------------+---------+-----------+----------+--------------+ FV Prox  Full           Yes      Yes                                 +---------+---------------+---------+-----------+----------+--------------+ FV Mid   Full           Yes      Yes                                 +---------+---------------+---------+-----------+----------+--------------+ FV DistalFull           Yes      Yes                                 +---------+---------------+---------+-----------+----------+--------------+ PFV      Full                                                        +---------+---------------+---------+-----------+----------+--------------+ POP      Full           Yes      Yes                                 +---------+---------------+---------+-----------+----------+--------------+ PTV      Full                                                        +---------+---------------+---------+-----------+----------+--------------+ PERO     Full                                                        +---------+---------------+---------+-----------+----------+--------------+   +---------+---------------+---------+-----------+----------+--------------+  LEFT     CompressibilityPhasicitySpontaneityPropertiesThrombus Aging +---------+---------------+---------+-----------+----------+--------------+ CFV      Full           Yes      Yes                                 +---------+---------------+---------+-----------+----------+--------------+ SFJ      Full                                                        +---------+---------------+---------+-----------+----------+--------------+ FV Prox  Full           Yes      Yes                                 +---------+---------------+---------+-----------+----------+--------------+ FV Mid   Full           Yes      Yes                                 +---------+---------------+---------+-----------+----------+--------------+ FV DistalFull           Yes      Yes                                 +---------+---------------+---------+-----------+----------+--------------+ PFV      Full                                                        +---------+---------------+---------+-----------+----------+--------------+ POP      Full           Yes      Yes                                 +---------+---------------+---------+-----------+----------+--------------+ PTV      Full                                                        +---------+---------------+---------+-----------+----------+--------------+ PERO     Full                                                        +---------+---------------+---------+-----------+----------+--------------+     Summary: BILATERAL: - No evidence of deep vein thrombosis seen in the lower extremities, bilaterally. -No evidence of popliteal cyst, bilaterally.   *See table(s) above for measurements and observations. Electronically signed by Deitra Mayo MD on 10/28/2022 at 3:17:57 PM.    Final    DG Chest 2 View  Result Date: 10/27/2022 CLINICAL DATA:  Altered mental status.  Hypoxia. Productive cough. Suspected sepsis.  EXAM: CHEST - 2 VIEW COMPARISON:  05/21/2022 FINDINGS: The heart size and mediastinal contours are within normal limits. Mild elevation of left hemidiaphragm is stable. Both lungs are clear. Cervical spine fusion hardware again noted. IMPRESSION: Stable exam. No active cardiopulmonary disease. Electronically Signed   By: Marlaine Hind M.D.   On: 10/27/2022 10:42    Assessment/Plan: Chronic diastolic heart failure (HCC) Chronic diastolic heart failure, DOE, on and off wheezing, cough, occasional yellow phlegm in am. CXR no evidence of acute infection 10/2022. EF 60-65% 05/22/22  Sinus tachycardia  heart rate is in control.   Perforation of sigmoid colon due to diverticulitis Hx of aute sigmoid diverticulitis with pneumoperitoneum, f/u Dr. [name], diarrhea on and off, negative C-diff while in hospital.   Asthma Epinephrine prn, Allegra, Flonase, Singulair  Rheumatoid arthritis ([place]) takes Prednisone, Tramadol, TSH 0.865 02/27/22  GERD (gastroesophageal reflux disease)  off acid reducer. Hgb 11.0 10/30/22  Edema of both lower extremities due to peripheral venous insufficiency   Venous insufficiency/Edema , takes Furosemide, Kcl. Bun/creat 11/0.94 10/30/22  Osteopenia Rheumatology, off Alendronate, on Vit D, t score -1.8 06/06/21  Hyperlipidemia takes Atorvastatin  B12 deficiency on Vit B12 po, Vit B12 235 06/20/21  Gout of right ankle R ankle, stable, takes Colchicine  DVT (deep venous thrombosis) ([place]) Hx of DVT, venous US BLE was negative for DVT while in hospital. Off Eliquis.     Family/ staff Communication: plan of care reviewed with the patient and charge nurse.   Labs/tests ordered:  none  Time spend 35 minutes.

## 2022-11-06 NOTE — Assessment & Plan Note (Signed)
Epinephrine prn, Allegra, Flonase, Singulair

## 2022-11-06 NOTE — Assessment & Plan Note (Signed)
Venous insufficiency/Edema , takes Furosemide, Kcl. Bun/creat 11/0.94 10/30/22

## 2022-11-06 NOTE — Assessment & Plan Note (Signed)
Chronic diastolic heart failure, DOE, on and off wheezing, cough, occasional yellow phlegm in am. CXR no evidence of acute infection 10/2022. EF 60-65% 05/22/22 

## 2022-11-06 NOTE — Assessment & Plan Note (Signed)
takes Prednisone, Tramadol, TSH 0.865 02/27/22

## 2022-11-06 NOTE — Assessment & Plan Note (Signed)
R ankle, stable, takes Colchicine 

## 2022-11-06 NOTE — Assessment & Plan Note (Signed)
Hx of aute sigmoid diverticulitis with pneumoperitoneum, f/u Dr. Kinsinger, diarrhea on and off, negative C-diff while in hospital.  

## 2022-11-06 NOTE — Assessment & Plan Note (Signed)
Rheumatology, off Alendronate, on Vit D, t score -1.8 06/06/21

## 2022-11-07 ENCOUNTER — Non-Acute Institutional Stay (SKILLED_NURSING_FACILITY): Payer: Medicare PPO | Admitting: Family Medicine

## 2022-11-07 DIAGNOSIS — M797 Fibromyalgia: Secondary | ICD-10-CM | POA: Diagnosis not present

## 2022-11-07 DIAGNOSIS — Z7952 Long term (current) use of systemic steroids: Secondary | ICD-10-CM | POA: Diagnosis not present

## 2022-11-07 DIAGNOSIS — M0579 Rheumatoid arthritis with rheumatoid factor of multiple sites without organ or systems involvement: Secondary | ICD-10-CM | POA: Diagnosis not present

## 2022-11-07 DIAGNOSIS — I872 Venous insufficiency (chronic) (peripheral): Secondary | ICD-10-CM

## 2022-11-07 DIAGNOSIS — B9620 Unspecified Escherichia coli [E. coli] as the cause of diseases classified elsewhere: Secondary | ICD-10-CM | POA: Diagnosis not present

## 2022-11-07 DIAGNOSIS — R7881 Bacteremia: Secondary | ICD-10-CM | POA: Diagnosis not present

## 2022-11-07 DIAGNOSIS — R197 Diarrhea, unspecified: Secondary | ICD-10-CM

## 2022-11-07 NOTE — Progress Notes (Signed)
Provider:  Alain Honey, MD Location:      Place of Service:     PCP: Mast, [name], NP [name], [name], [name]  Extended Emergency Contact Information Primary Emergency Contact: [name] of [place] Phone: [phone] Mobile Phone: [phone] Relation: Daughter  Code Status:  Goals of Care: Advanced Directive information    11/06/2022    2:50 PM  Advanced Directives  Does Patient Have a Medical Advance Directive? Yes  Type of Paramedic of [place];Living will  Does patient want to make changes to medical advance directive? No - Patient declined  Copy of [place] in Chart? No - copy requested      No chief complaint on file.   HPI: Patient is a 88 y.o. female seen today for admission to [facility] after a brief hospitalization for urinary tract infection and sepsis.  He had presented to the emergency room with decreased mentation and associated cough and fever.  She was initially found to be hypoxic with evidence of sepsis with concern for urinary tract as source.  She was started empirically on antibiotic and IV fluid fluids.  Chest x-ray showed no evidence of infection but urine culture was positive for UTI.  Has history of same with previous hospitalization. She had diarrhea in the hospital but stool for C. difficile were negative.  He has history of rheumatoid arthritis on prednisone 10 mg daily.  She also has a history of gout, on colchicine, GERD on Protonix, hyperlipidemia on Lipitor, history of DVT off Eliquis and with negative Doppler in the hospital.  Past Medical History:  Diagnosis Date   Adrenal failure ([place])    Arthritis    Asthma    Cancer ([place])    Cataract    Closed nondisplaced fracture of fifth right metatarsal bone 09/18/2017   Diverticulitis    Per patient   E coli bacteremia    Fibromyalgia 2008   GERD  (gastroesophageal reflux disease) 12/18/2016   HCAP (healthcare-associated pneumonia) 02/03/2018   Hyperlipidemia 10/13/2021   Osteoporosis    RA (rheumatoid arthritis) ([place])    Recurrent upper respiratory infection (URI)    Sepsis due to urinary tract infection ([place]) 01/18/2018   Urticaria    Past Surgical History:  Procedure Laterality Date   BACK SURGERY     BILATERAL CARPAL TUNNEL RELEASE  2005   right and left   CATARACT EXTRACTION, BILATERAL  2004   right and left   CERVICAL FUSION  2011,2010,2008   2 disks   HEEL SPUR SURGERY  2004   lower back fusion  2011   Fusion of 3-4 and 4-5 lower back   RADIOFREQUENCY ABLATION  2020   ROTATOR CUFF REPAIR  [id number]   SQUAMOUS CELL CARCINOMA EXCISION     TONSILLECTOMY AND ADENOIDECTOMY  1947   TOTAL SHOULDER ARTHROPLASTY      reports that she quit smoking about 55 years ago. Her smoking use included cigarettes. She has a 7.50 pack-year smoking history. She has never used smokeless tobacco. She reports that she does not drink alcohol and does not use drugs. Social History   Socioeconomic History   Marital status: Widowed    Spouse name: Not on file   Number of children: 2   Years of education: Not on file   Highest education level: Not on file  Occupational History   Occupation: Retired Education officer, museum  Tobacco  Use   Smoking status: Former    Packs/day: 0.75    Years: 10.00    Total pack years: 7.50    Types: Cigarettes    Quit date: 1968    Years since quitting: 55.9   Smokeless tobacco: Never  Vaping Use   Vaping Use: Never used  Substance and Sexual Activity   Alcohol use: No   Drug use: No   Sexual activity: Not Currently  Other Topics Concern   Not on file  Social History Narrative      Diet:        Do you drink/ eat things with caffeine? Dr. Malachi Bonds 2/ day      Marital status: Widowed                              What year were you married ? 1953      Do you live in a house, apartment,assistred living,  condo, trailer, etc.)? Apartment      Is it one or more stories?       How many persons live in your home ? 1      Do you have any pets in your home ?(please list) No      Highest Level of education completed: PhD       Current or past profession: Transport planner, Education officer, museum, Special Educator       Do you exercise?   No                           Type & how often       ADVANCED DIRECTIVES (Please bring copies)      Do you have a living will? Yes      Do you have a DNR form?                       If not, do you want to discuss one? Yes      Do you have signed POA?HPOA forms?                 If so, please bring to your appointment Tes      FUNCTIONAL STATUS- To be completed by Spouse / child / Staff       Do you have difficulty bathing or dressing yourself ? No      Do you have difficulty preparing food or eating ? No      Do you have difficulty managing your mediation ? No      Do you have difficulty managing your finances ? No      Do you have difficulty affording your medication ? No      Social Determinants of Health   Financial Resource Strain: Not on file  Food Insecurity: No Food Insecurity (10/27/2022)   Hunger Vital Sign    Worried About Running Out of Food in the Last Year: Never true    Ran Out of Food in the Last Year: Never true  Transportation Needs: No Transportation Needs (10/27/2022)   PRAPARE - Hydrologist (Medical): No    Lack of Transportation (Non-Medical): No  Physical Activity: Not on file  Stress: Not on file  Social Connections: Not on file  Intimate Partner Violence: Not At Risk (10/27/2022)   Humiliation, Afraid, Rape, and Kick questionnaire    Fear of Current or Ex-Partner:  No    Emotionally Abused: No    Physically Abused: No    Sexually Abused: No    Functional Status Survey:    Family History  Problem Relation Age of Onset   Heart attack Maternal Grandmother    Heart attack Paternal Grandfather     Breast cancer Mother 42   Diabetes Father    Heart disease Father    Congestive Heart Failure Father 64       Died from   Allergic rhinitis Neg Hx    Asthma Neg Hx    Eczema Neg Hx    Urticaria Neg Hx     Health Maintenance  Topic Date Due   INFLUENZA VACCINE  07/11/2022   COVID-19 Vaccine (5 - 2023-24 season) 08/11/2022   Medicare Annual Wellness (AWV)  01/19/2023   Pneumonia Vaccine 62+ Years old  Completed   DEXA SCAN  Completed   Zoster Vaccines- Shingrix  Completed   HPV VACCINES  Aged Out    Allergies  Allergen Reactions   Adhesive [Tape] Rash   Celebrex [Celecoxib] Hives   Ciprofibrate Nausea Only   Cymbalta [Duloxetine Hcl] Swelling   Gabitril [Tiagabine] Swelling   Lyrica [Pregabalin] Swelling   Neurontin [Gabapentin] Swelling   Nexium [Esomeprazole] Rash   Nsaids Rash   Shrimp [Shellfish Allergy] Anaphylaxis    Per patient "shrimp only"   Azactam [Aztreonam]     Hand swelling    Azelastine Hcl     Rash    Ciprofloxacin Other (See Comments)    dizziness   Claritin [Loratadine]     Irritability Nervousness    Methotrexate Derivatives    Nasacort [Triamcinolone]     Dizzy    Olopatadine Other (See Comments)    Pain and lethargy    Other    Sulfamethizole Other (See Comments)    unknown   Zantac [Ranitidine Hcl] Other (See Comments)    unknown   Claritin-D 12 Hour [Loratadine-Pseudoephedrine Er] Anxiety   Keflex [Cephalexin] Nausea And Vomiting    Tolerated Ancef   Penicillins Rash    Injection site reaction. Tolerated cefepime in past. Also reports tolerating a penicillin infusion after this initial rxn ~20 yrs ago.  Has patient had a PCN reaction causing immediate rash, facial/tongue/throat swelling, SOB or lightheadedness with hypotension: No Has patient had a PCN reaction causing severe rash involving mucus membranes or skin necrosis: No Has patient had a PCN reaction that required hospitalization No Has patient had a PCN reaction occurring  within the last 10 years: No     Sulfa Antibiotics Nausea And Vomiting    Outpatient Encounter Medications as of 11/07/2022  Medication Sig   atorvastatin (LIPITOR) 20 MG tablet TAKE 1 TABLET BY MOUTH EVERY DAY   Biotin 5 MG TBDP Take 1 tablet (5 mg total) by mouth daily.   Cholecalciferol (VITAMIN D3) 125 MCG (5000 UT) CAPS TAKE 1 CAPSULE BY MOUTH EVERY DAY   colchicine 0.6 MG tablet TAKE 1 TABLET BY MOUTH EVERY DAY   CRANBERRY PO Take 1 capsule by mouth daily.   cycloSPORINE (RESTASIS) 0.05 % ophthalmic emulsion Place 1 drop into both eyes 2 (two) times daily.   diclofenac Sodium (VOLTAREN) 1 % GEL APPLY 2 TO 4 GRAMS TOPICALLY TO AFFECTED JOINTS UP TO 4 TIMES DAILY (Patient taking differently: Apply 2-4 g topically 4 (four) times daily as needed (pain). APPLY 2 TO 4 GRAMS TOPICALLY TO AFFECTED JOINTS UP TO 4 TIMES DAILY)   fexofenadine (ALLEGRA) 180 MG tablet  Take 1 tablet (180 mg total) by mouth daily.   furosemide (LASIX) 80 MG tablet Take 1 tablet (80 mg total) by mouth daily.   lansoprazole (PREVACID) 30 MG capsule TAKE 1 CAPSULE BY MOUTH ONCE DAILY AT NOON (Patient taking differently: Take 30 mg by mouth daily at 12 noon.)   Melatonin 10 MG TABS Take 10 mg by mouth at bedtime.   montelukast (SINGULAIR) 10 MG tablet TAKE 1 TABLET BY MOUTH EVERYDAY AT BEDTIME (Patient taking differently: Take 10 mg by mouth at bedtime.)   potassium chloride (MICRO-K) 10 MEQ CR capsule Take 2 capsules (20 mEq total) by mouth 2 (two) times daily.   predniSONE (DELTASONE) 10 MG tablet TAKE 1 TABLET (10 MG TOTAL) BY MOUTH DAILY WITH BREAKFAST.   traMADol HCl 100 MG TABS Take 50 mg by mouth 3 (three) times daily as needed for up to 14 days.   vitamin B-12 (CYANOCOBALAMIN) 1000 MCG tablet Take 1 tablet (1,000 mcg total) by mouth daily.   No facility-administered encounter medications on file as of 11/07/2022.    Review of Systems  Constitutional:  Positive for fatigue.  HENT: Negative.    Respiratory:  Negative.    Cardiovascular:  Positive for leg swelling.  Musculoskeletal:  Positive for arthralgias and gait problem.  Psychiatric/Behavioral: Negative.    All other systems reviewed and are negative.   There were no vitals filed for this visit. There is no height or weight on file to calculate BMI. Physical Exam Vitals and nursing note reviewed.  Constitutional:      Appearance: Normal appearance.  HENT:     Head: Normocephalic.     Mouth/Throat:     Mouth: Mucous membranes are moist.     Pharynx: Oropharynx is clear.  Cardiovascular:     Rate and Rhythm: Normal rate and regular rhythm.     Heart sounds: Normal heart sounds.  Pulmonary:     Effort: Pulmonary effort is normal.     Breath sounds: Normal breath sounds.  Abdominal:     General: Bowel sounds are normal.     Palpations: Abdomen is soft.  Musculoskeletal:        General: Normal range of motion.  Neurological:     General: No focal deficit present.     Mental Status: She is alert and oriented to person, place, and time.  Psychiatric:        Mood and Affect: Mood normal.        Behavior: Behavior normal.        Thought Content: Thought content normal.     Labs reviewed: Basic Metabolic Panel: Recent Labs    05/28/22 0551 05/29/22 0419 05/30/22 0514 10/27/22 0945 10/28/22 0357 10/29/22 0419 10/30/22 1216 10/31/22 0415  NA 140 140 143   < > 143 144 143  --   K 3.3* 4.1 3.9   < > 3.3* 2.9* 3.1* 4.0  CL 99 102 104   < > 109 111 109  --   CO2 '30 29 30   '$ < > '27 24 24  '$ --   GLUCOSE 114* 178* 157*   < > 152* 103* 98  --   BUN 9 20 24*   < > '10 13 11  '$ --   CREATININE 0.83 0.85 0.87   < > 1.05* 0.84 0.94  --   CALCIUM 9.5 9.5 9.1   < > 8.9 8.8* 8.6*  --   MG 2.0 2.3 2.2  --   --   --   --   --    < > =  values in this interval not displayed.   Liver Function Tests: Recent Labs    05/27/22 0543 10/27/22 0945 10/28/22 0357  AST 14* 25 19  ALT '10 17 14  '$ ALKPHOS 49 61 47  BILITOT 0.8 1.4* 1.1  PROT  6.1* 7.6 6.3*  ALBUMIN 2.9* 3.7 2.7*   Recent Labs    05/21/22 1205  LIPASE 22   No results for input(s): "AMMONIA" in the last 8760 hours. CBC: Recent Labs    10/27/22 0945 10/28/22 0357 10/29/22 0343 10/30/22 1216  WBC 18.6* 17.1* 13.6* 11.9*  NEUTROABS 12.7* 14.6* 10.1*  --   HGB 13.0 10.9* 9.8* 11.0*  HCT 41.9 35.8* 32.8* 37.5  MCV 97.2 98.6 99.7 103.0*  PLT 280 240 221 246   Cardiac Enzymes: No results for input(s): "CKTOTAL", "CKMB", "CKMBINDEX", "TROPONINI" in the last 8760 hours. BNP: Invalid input(s): "POCBNP" No results found for: "HGBA1C" Lab Results  Component Value Date   TSH 0.865 02/27/2022   Lab Results  Component Value Date   VITAMINB12 1,377 (H) 05/26/2022   Lab Results  Component Value Date   FOLATE 26.8 02/06/2018   Lab Results  Component Value Date   IRON 89 02/06/2018   TIBC 281 02/06/2018   FERRITIN 74 02/06/2018    Imaging and Procedures obtained prior to SNF admission: VAS [place] LOWER EXTREMITY VENOUS (DVT)  Result Date: 10/28/2022  Lower Venous DVT Study Patient Name:  RENESMEE RAINE  Date of Exam:   10/28/2022 Medical Rec #: [id number]           Accession #:    [id number] Date of Birth: August 04, 1932           Patient Gender: F Patient Age:   4 years Exam Location:  [facility] Procedure:      VAS [place] LOWER EXTREMITY VENOUS (DVT) Referring Phys: RALPH NETTEY --------------------------------------------------------------------------------  Indications: RLE pain.  Risk Factors: DVT 05/2022 RLE. Limitations: Poor ultrasound/tissue interface. Comparison Study: Previous exam on 05/26/22 was positive for DVT in RLE posterior                   tibial vein. Performing Technologist: Rogelia Rohrer RVT, RDMS  Examination Guidelines: A complete evaluation includes B-mode imaging, spectral Doppler, color Doppler, and power Doppler as needed of all accessible portions of each vessel. Bilateral testing is considered an integral part of a complete  examination. Limited examinations for reoccurring indications may be performed as noted. The reflux portion of the exam is performed with the patient in reverse Trendelenburg.  +---------+---------------+---------+-----------+----------+--------------+ RIGHT    CompressibilityPhasicitySpontaneityPropertiesThrombus Aging +---------+---------------+---------+-----------+----------+--------------+ CFV      Full           Yes      Yes                                 +---------+---------------+---------+-----------+----------+--------------+ SFJ      Full                                                        +---------+---------------+---------+-----------+----------+--------------+ FV Prox  Full           Yes      Yes                                 +---------+---------------+---------+-----------+----------+--------------+  FV Mid   Full           Yes      Yes                                 +---------+---------------+---------+-----------+----------+--------------+ FV DistalFull           Yes      Yes                                 +---------+---------------+---------+-----------+----------+--------------+ PFV      Full                                                        +---------+---------------+---------+-----------+----------+--------------+ POP      Full           Yes      Yes                                 +---------+---------------+---------+-----------+----------+--------------+ PTV      Full                                                        +---------+---------------+---------+-----------+----------+--------------+ PERO     Full                                                        +---------+---------------+---------+-----------+----------+--------------+   +---------+---------------+---------+-----------+----------+--------------+ LEFT     CompressibilityPhasicitySpontaneityPropertiesThrombus Aging  +---------+---------------+---------+-----------+----------+--------------+ CFV      Full           Yes      Yes                                 +---------+---------------+---------+-----------+----------+--------------+ SFJ      Full                                                        +---------+---------------+---------+-----------+----------+--------------+ FV Prox  Full           Yes      Yes                                 +---------+---------------+---------+-----------+----------+--------------+ FV Mid   Full           Yes      Yes                                 +---------+---------------+---------+-----------+----------+--------------+ FV DistalFull  Yes      Yes                                 +---------+---------------+---------+-----------+----------+--------------+ PFV      Full                                                        +---------+---------------+---------+-----------+----------+--------------+ POP      Full           Yes      Yes                                 +---------+---------------+---------+-----------+----------+--------------+ PTV      Full                                                        +---------+---------------+---------+-----------+----------+--------------+ PERO     Full                                                        +---------+---------------+---------+-----------+----------+--------------+     Summary: BILATERAL: - No evidence of deep vein thrombosis seen in the lower extremities, bilaterally. -No evidence of popliteal cyst, bilaterally.   *See table(s) above for measurements and observations. Electronically signed by Deitra Mayo MD on 10/28/2022 at 3:17:57 PM.    Final    DG Chest 2 View  Result Date: 10/27/2022 CLINICAL DATA:  Altered mental status. Hypoxia. Productive cough. Suspected sepsis. EXAM: CHEST - 2 VIEW COMPARISON:  05/21/2022 FINDINGS: The heart size and  mediastinal contours are within normal limits. Mild elevation of left hemidiaphragm is stable. Both lungs are clear. Cervical spine fusion hardware again noted. IMPRESSION: Stable exam. No active cardiopulmonary disease. Electronically Signed   By: Marlaine Hind M.D.   On: 10/27/2022 10:42    Assessment/Plan 1. Diarrhea, unspecified type Patient thinks diarrhea is related to the use of antibiotics.  This has been intermittently chronic.  Hospitalist (perhaps related to colchicine but she has been on colchicine for a while without diarrhea.  Problem is improving  2. E coli bacteremia Treated with ceftriaxone in the hospital, but subsequent urine culture was grew out Proteus.  Switch to cefadroxil  3. Edema of both lower extremities due to peripheral venous insufficiency Has seen cardiologist no CHF.  Probably related to venous stasis.  On furosemide 80 mg  4. Fibromyalgia Uses tramadol for pain  5. Long term current use of systemic steroids, for RA Maintenance dose is 10 mg a day.  Was increased to 40 mg due to stress of sepsis while in the hospital  6. Rheumatoid arthritis involving multiple sites with positive rheumatoid factor (HCC) Prednisone is effective.  Still uses some tramadol for pain    Family/ staff Communication:   Labs/tests ordered: none  Lillette Boxer. Sabra Heck, [place] [address] [place], [place] Office [phone]

## 2022-11-08 ENCOUNTER — Other Ambulatory Visit: Payer: Self-pay | Admitting: Adult Health

## 2022-11-08 MED ORDER — TRAMADOL HCL 50 MG PO TABS: 50.0000 mg | ORAL_TABLET | Freq: Four times a day (QID) | ORAL | 0 refills | Status: AC | PRN

## 2022-11-16 DIAGNOSIS — A419 Sepsis, unspecified organism: Secondary | ICD-10-CM | POA: Diagnosis not present

## 2022-11-17 ENCOUNTER — Encounter: Payer: Self-pay | Admitting: Nurse Practitioner

## 2022-11-17 ENCOUNTER — Non-Acute Institutional Stay (SKILLED_NURSING_FACILITY): Payer: Medicare PPO | Admitting: Nurse Practitioner

## 2022-11-17 DIAGNOSIS — M109 Gout, unspecified: Secondary | ICD-10-CM

## 2022-11-17 DIAGNOSIS — E782 Mixed hyperlipidemia: Secondary | ICD-10-CM | POA: Diagnosis not present

## 2022-11-17 DIAGNOSIS — M810 Age-related osteoporosis without current pathological fracture: Secondary | ICD-10-CM | POA: Diagnosis not present

## 2022-11-17 DIAGNOSIS — I872 Venous insufficiency (chronic) (peripheral): Secondary | ICD-10-CM | POA: Diagnosis not present

## 2022-11-17 DIAGNOSIS — I824Y9 Acute embolism and thrombosis of unspecified deep veins of unspecified proximal lower extremity: Secondary | ICD-10-CM

## 2022-11-17 DIAGNOSIS — J45909 Unspecified asthma, uncomplicated: Secondary | ICD-10-CM

## 2022-11-17 DIAGNOSIS — M0579 Rheumatoid arthritis with rheumatoid factor of multiple sites without organ or systems involvement: Secondary | ICD-10-CM | POA: Diagnosis not present

## 2022-11-17 DIAGNOSIS — K219 Gastro-esophageal reflux disease without esophagitis: Secondary | ICD-10-CM | POA: Diagnosis not present

## 2022-11-17 DIAGNOSIS — K5720 Diverticulitis of large intestine with perforation and abscess without bleeding: Secondary | ICD-10-CM

## 2022-11-17 DIAGNOSIS — E538 Deficiency of other specified B group vitamins: Secondary | ICD-10-CM

## 2022-11-17 DIAGNOSIS — I5032 Chronic diastolic (congestive) heart failure: Secondary | ICD-10-CM

## 2022-11-17 DIAGNOSIS — R000 Tachycardia, unspecified: Secondary | ICD-10-CM

## 2022-11-17 NOTE — Assessment & Plan Note (Signed)
Chronic diastolic heart failure, DOE, on and off wheezing, cough, occasional yellow phlegm in am. CXR no evidence of acute infection 10/2022. EF 60-65% 05/22/22 

## 2022-11-17 NOTE — Assessment & Plan Note (Signed)
RA/fibromyalgia,  takes Prednisone, Tramadol, TSH 0.865 02/27/22 

## 2022-11-17 NOTE — Assessment & Plan Note (Signed)
on Vit B12 po, Vit B12 235 06/20/21 

## 2022-11-17 NOTE — Assessment & Plan Note (Signed)
takes Furosemide, Kcl. Bun/creat 11/0.94 10/30/22, K 4.0 10/31/22, underwent cardiology evaluation, no s/s of developing CHF decompensation.

## 2022-11-17 NOTE — Assessment & Plan Note (Signed)
R ankle, stable, takes Colchicine 

## 2022-11-17 NOTE — Assessment & Plan Note (Signed)
Stable, off acid reducer. Hgb 11.0 10/30/22

## 2022-11-17 NOTE — Progress Notes (Unsigned)
Location:  [facility] Room Number: 35 Place of Service:  SNF (31) Provider:    Raden Byington X, NP  Patient Care Team: Marilena Trevathan X, [name]  Extended Emergency Contact Information Primary Emergency Contact: Noel Gerold [place] Phone: [phone] Mobile Phone: [phone] Relation: Daughter  Code Status:  full code Goals of care: Advanced Directive information    11/17/2022    4:42 PM  Advanced Directives  Does Patient Have a Medical Advance Directive? Yes  Type of Paramedic of [place];Living will  Does patient want to make changes to medical advance directive? No - Patient declined  Copy of [place] in Chart? No - copy requested     Chief Complaint  Patient presents with   Acute Visit    discharge to [place]   Quality Metric Gaps    Discussed the need for covid and flu vaccine    HPI:  Pt is a 88 y.o. female seen today for an acute visit for    Past Medical History:  Diagnosis Date   Adrenal failure ([place])    Arthritis    Asthma    Cancer ([place])    Cataract    Closed nondisplaced fracture of fifth right metatarsal bone 09/18/2017   Diverticulitis    Per patient   E coli bacteremia    Fibromyalgia 2008   GERD (gastroesophageal reflux disease) 12/18/2016   HCAP (healthcare-associated pneumonia) 02/03/2018   Hyperlipidemia 10/13/2021   Osteoporosis    RA (rheumatoid arthritis) ([place])    Recurrent upper respiratory infection (URI)    Sepsis due to urinary tract infection ([place]) 01/18/2018   Urticaria    Past Surgical History:  Procedure Laterality Date   BACK SURGERY     BILATERAL CARPAL TUNNEL RELEASE  2005   right and left   CATARACT EXTRACTION, BILATERAL  2004   right and left   CERVICAL FUSION  2011,2010,2008   2 disks   HEEL SPUR SURGERY  2004   lower back fusion  2011   Fusion of 3-4 and 4-5 lower back   RADIOFREQUENCY  ABLATION  2020   ROTATOR CUFF REPAIR  [id number]   SQUAMOUS CELL CARCINOMA EXCISION     TONSILLECTOMY AND ADENOIDECTOMY  1947   TOTAL SHOULDER ARTHROPLASTY      Allergies  Allergen Reactions   Adhesive [Tape] Rash   Celebrex [Celecoxib] Hives   Ciprofibrate Nausea Only   Cymbalta [Duloxetine Hcl] Swelling   Gabitril [Tiagabine] Swelling   Lyrica [Pregabalin] Swelling   Neurontin [Gabapentin] Swelling   Nexium [Esomeprazole] Rash   Nsaids Rash   Shrimp [Shellfish Allergy] Anaphylaxis    Per patient "shrimp only"   Azactam [Aztreonam]     Hand swelling    Azelastine Hcl     Rash    Ciprofloxacin Other (See Comments)    dizziness   Claritin [Loratadine]     Irritability Nervousness    Methotrexate Derivatives    Nasacort [Triamcinolone]     Dizzy    Olopatadine Other (See Comments)    Pain and lethargy    Other    Sulfamethizole Other (See Comments)    unknown   Zantac [Ranitidine Hcl] Other (See Comments)    unknown   Claritin-D 12 Hour [Loratadine-Pseudoephedrine Er] Anxiety   Keflex [Cephalexin] Nausea And Vomiting    Tolerated Ancef   Penicillins Rash    Injection site reaction. Tolerated cefepime in past.  Also reports tolerating a penicillin infusion after this initial rxn ~20 yrs ago.  Has patient had a PCN reaction causing immediate rash, facial/tongue/throat swelling, SOB or lightheadedness with hypotension: No Has patient had a PCN reaction causing severe rash involving mucus membranes or skin necrosis: No Has patient had a PCN reaction that required hospitalization No Has patient had a PCN reaction occurring within the last 10 years: No     Sulfa Antibiotics Nausea And Vomiting    Outpatient Encounter Medications as of 11/17/2022  Medication Sig   atorvastatin (LIPITOR) 20 MG tablet TAKE 1 TABLET BY MOUTH EVERY DAY   Biotin 5 MG TBDP Take 1 tablet (5 mg total) by mouth daily.   Cholecalciferol (VITAMIN D3) 125 MCG (5000 UT) CAPS TAKE 1 CAPSULE BY MOUTH  EVERY DAY   colchicine 0.6 MG tablet TAKE 1 TABLET BY MOUTH EVERY DAY   CRANBERRY PO Take 1 capsule by mouth daily.   cycloSPORINE (RESTASIS) 0.05 % ophthalmic emulsion Place 1 drop into both eyes 2 (two) times daily.   diclofenac Sodium (VOLTAREN) 1 % GEL APPLY 2 TO 4 GRAMS TOPICALLY TO AFFECTED JOINTS UP TO 4 TIMES DAILY (Patient taking differently: Apply 2-4 g topically 4 (four) times daily as needed (pain). APPLY 2 TO 4 GRAMS TOPICALLY TO AFFECTED JOINTS UP TO 4 TIMES DAILY)   fexofenadine (ALLEGRA) 180 MG tablet Take 1 tablet (180 mg total) by mouth daily.   furosemide (LASIX) 80 MG tablet Take 1 tablet (80 mg total) by mouth daily.   lansoprazole (PREVACID) 30 MG capsule TAKE 1 CAPSULE BY MOUTH ONCE DAILY AT NOON (Patient taking differently: Take 30 mg by mouth daily at 12 noon.)   Melatonin 10 MG TABS Take 10 mg by mouth at bedtime.   montelukast (SINGULAIR) 10 MG tablet TAKE 1 TABLET BY MOUTH EVERYDAY AT BEDTIME (Patient taking differently: Take 10 mg by mouth at bedtime.)   potassium chloride (MICRO-K) 10 MEQ CR capsule Take 2 capsules (20 mEq total) by mouth 2 (two) times daily.   predniSONE (DELTASONE) 10 MG tablet TAKE 1 TABLET (10 MG TOTAL) BY MOUTH DAILY WITH BREAKFAST.   vitamin B-12 (CYANOCOBALAMIN) 1000 MCG tablet Take 1 tablet (1,000 mcg total) by mouth daily.   No facility-administered encounter medications on file as of 11/17/2022.    Review of Systems  Immunization History  Administered Date(s) Administered   Fluad Quad(high Dose 65+) 09/30/2020   Influenza, High Dose Seasonal PF 08/16/2018, 09/08/2019, 09/24/2020, 10/04/2021   Influenza,inj,Quad PF,6+ Mos 08/11/2016   Influenza-Unspecified 11/12/2017   Moderna Covid-19 Vaccine Bivalent Booster 62yr & up 10/04/2021   Moderna Sars-Covid-2 Vaccination 12/15/2019, 01/12/2020, 10/19/2020   PNEUMOCOCCAL CONJUGATE-20 10/04/2021   PPD Test 03/01/2018   Pneumococcal Conjugate-13 06/26/2017   Pneumococcal Polysaccharide-23  12/11/2009   Zoster Recombinat (Shingrix) 01/28/2019, 09/02/2019   Pertinent  Health Maintenance Due  Topic Date Due   INFLUENZA VACCINE  07/11/2022   DEXA SCAN  Completed      10/30/2022   10:04 AM 10/30/2022    8:30 PM 10/31/2022    9:18 AM 10/31/2022    8:00 PM 11/01/2022    7:45 AM  Fall Risk  Patient Fall Risk Level High fall risk High fall risk High fall risk High fall risk High fall risk   Functional Status Survey:    Vitals:   11/17/22 1631  BP: 136/88  Pulse: 91  Resp: 17  Temp: (!) 97.4 F (36.3 C)  TempSrc: Temporal  SpO2: 97%  Weight: 185  lb 3.2 oz (84 kg)  Height: 5' 2.5" (1.588 m)   Body mass index is 33.33 kg/m. Physical Exam  Labs reviewed: Recent Labs    05/28/22 0551 05/29/22 0419 05/30/22 0514 10/27/22 0945 10/28/22 0357 10/29/22 0419 10/30/22 1216 10/31/22 0415  NA 140 140 143   < > 143 144 143  --   K 3.3* 4.1 3.9   < > 3.3* 2.9* 3.1* 4.0  CL 99 102 104   < > 109 111 109  --   CO2 '30 29 30   '$ < > '27 24 24  '$ --   GLUCOSE 114* 178* 157*   < > 152* 103* 98  --   BUN 9 20 24*   < > '10 13 11  '$ --   CREATININE 0.83 0.85 0.87   < > 1.05* 0.84 0.94  --   CALCIUM 9.5 9.5 9.1   < > 8.9 8.8* 8.6*  --   MG 2.0 2.3 2.2  --   --   --   --   --    < > = values in this interval not displayed.   Recent Labs    05/27/22 0543 10/27/22 0945 10/28/22 0357  AST 14* 25 19  ALT '10 17 14  '$ ALKPHOS 49 61 47  BILITOT 0.8 1.4* 1.1  PROT 6.1* 7.6 6.3*  ALBUMIN 2.9* 3.7 2.7*   Recent Labs    10/27/22 0945 10/28/22 0357 10/29/22 0343 10/30/22 1216  WBC 18.6* 17.1* 13.6* 11.9*  NEUTROABS 12.7* 14.6* 10.1*  --   HGB 13.0 10.9* 9.8* 11.0*  HCT 41.9 35.8* 32.8* 37.5  MCV 97.2 98.6 99.7 103.0*  PLT 280 240 221 246   Lab Results  Component Value Date   TSH 0.865 02/27/2022   No results found for: "HGBA1C" Lab Results  Component Value Date   CHOL 176 04/06/2020   HDL 67 04/06/2020   LDLCALC 82 04/06/2020   TRIG 171 (H) 04/06/2020   CHOLHDL 2.6  04/06/2020    Significant Diagnostic Results in last 30 days:  DG Foot Complete Left  Result Date: 10/30/2022 CLINICAL DATA:  Left foot pain and swelling. EXAM: LEFT FOOT - COMPLETE 3+ VIEW COMPARISON:  Left foot radiographs 05/27/2020 FINDINGS: Minimal distal lateral cuboid degenerative spurring at the calcaneocuboid joint. Minimal joint space narrowing of the interphalangeal joints diffusely. Tiny plantar and posterior calcaneal heel spurs. No acute fracture or dislocation. No cortical erosion or periostitis. IMPRESSION: 1. Minimal osteoarthritis of the interphalangeal joints diffusely. 2. Tiny plantar and posterior calcaneal heel spurs. Electronically Signed   By: Yvonne Kendall M.D.   On: 10/30/2022 13:55   DG CHEST PORT 1 VIEW  Result Date: 10/30/2022 CLINICAL DATA:  [place] EXAM: PORTABLE CHEST 1 VIEW COMPARISON:  Radiograph 10/27/2022 FINDINGS: Unchanged cardiomediastinal silhouette. Low lung volumes. Bibasilar subsegmental atelectasis. Airspace consolidation. No pleural effusion or pneumothorax. No acute osseous abnormality. Thoracic spondylosis. Cervical spine fusion hardware noted. Bilateral shoulder degenerative changes. IMPRESSION: Low lung volumes with bibasilar subsegmental atelectasis. No airspace consolidation. Electronically Signed   By: Maurine Simmering M.D.   On: 10/30/2022 11:59   VAS [place] LOWER EXTREMITY VENOUS (DVT)  Result Date: 10/28/2022  Lower Venous DVT Study Patient Name:  CORRINA STEFFENSEN  Date of Exam:   10/28/2022 Medical Rec #: [id number]           Accession #:    [id number] Date of Birth: 03/29/1932           Patient Gender: F Patient  Age:   89 years Exam Location:  [facility] Procedure:      VAS [place] LOWER EXTREMITY VENOUS (DVT) Referring Phys: RALPH NETTEY --------------------------------------------------------------------------------  Indications: RLE pain.  Risk Factors: DVT 05/2022 RLE. Limitations: Poor ultrasound/tissue interface. Comparison Study: Previous exam  on 05/26/22 was positive for DVT in RLE posterior                   tibial vein. Performing Technologist: Rogelia Rohrer RVT, RDMS  Examination Guidelines: A complete evaluation includes B-mode imaging, spectral Doppler, color Doppler, and power Doppler as needed of all accessible portions of each vessel. Bilateral testing is considered an integral part of a complete examination. Limited examinations for reoccurring indications may be performed as noted. The reflux portion of the exam is performed with the patient in reverse Trendelenburg.  +---------+---------------+---------+-----------+----------+--------------+ RIGHT    CompressibilityPhasicitySpontaneityPropertiesThrombus Aging +---------+---------------+---------+-----------+----------+--------------+ CFV      Full           Yes      Yes                                 +---------+---------------+---------+-----------+----------+--------------+ SFJ      Full                                                        +---------+---------------+---------+-----------+----------+--------------+ FV Prox  Full           Yes      Yes                                 +---------+---------------+---------+-----------+----------+--------------+ FV Mid   Full           Yes      Yes                                 +---------+---------------+---------+-----------+----------+--------------+ FV DistalFull           Yes      Yes                                 +---------+---------------+---------+-----------+----------+--------------+ PFV      Full                                                        +---------+---------------+---------+-----------+----------+--------------+ POP      Full           Yes      Yes                                 +---------+---------------+---------+-----------+----------+--------------+ PTV      Full                                                         +---------+---------------+---------+-----------+----------+--------------+  PERO     Full                                                        +---------+---------------+---------+-----------+----------+--------------+   +---------+---------------+---------+-----------+----------+--------------+ LEFT     CompressibilityPhasicitySpontaneityPropertiesThrombus Aging +---------+---------------+---------+-----------+----------+--------------+ CFV      Full           Yes      Yes                                 +---------+---------------+---------+-----------+----------+--------------+ SFJ      Full                                                        +---------+---------------+---------+-----------+----------+--------------+ FV Prox  Full           Yes      Yes                                 +---------+---------------+---------+-----------+----------+--------------+ FV Mid   Full           Yes      Yes                                 +---------+---------------+---------+-----------+----------+--------------+ FV DistalFull           Yes      Yes                                 +---------+---------------+---------+-----------+----------+--------------+ PFV      Full                                                        +---------+---------------+---------+-----------+----------+--------------+ POP      Full           Yes      Yes                                 +---------+---------------+---------+-----------+----------+--------------+ PTV      Full                                                        +---------+---------------+---------+-----------+----------+--------------+ PERO     Full                                                        +---------+---------------+---------+-----------+----------+--------------+  Summary: BILATERAL: - No evidence of deep vein thrombosis seen in the lower extremities, bilaterally. -No evidence of  popliteal cyst, bilaterally.   *See table(s) above for measurements and observations. Electronically signed by Deitra Mayo MD on 10/28/2022 at 3:17:57 PM.    Final    DG Chest 2 View  Result Date: 10/27/2022 CLINICAL DATA:  Altered mental status. Hypoxia. Productive cough. Suspected sepsis. EXAM: CHEST - 2 VIEW COMPARISON:  05/21/2022 FINDINGS: The heart size and mediastinal contours are within normal limits. Mild elevation of left hemidiaphragm is stable. Both lungs are clear. Cervical spine fusion hardware again noted. IMPRESSION: Stable exam. No active cardiopulmonary disease. Electronically Signed   By: Marlaine Hind M.D.   On: 10/27/2022 10:42    Assessment/Plan There are no diagnoses linked to this encounter.   Family/ staff Communication: ***  Labs/tests ordered:  ***

## 2022-11-17 NOTE — Progress Notes (Signed)
Location:   SNF [place] Room Number: 39 Place of Service:  SNF (31)  Provider: Marlana Latus NP  PCP: Gunda Maqueda X, NP Patient Care Team: Kennith Morss X, [name]  Extended Emergency Contact Information Primary Emergency Contact: Noel Gerold [place] Phone: [phone] Mobile Phone: [phone] Relation: Daughter  Code Status: DNR Goals of care:  Advanced Directive information    11/06/2022    2:50 PM  Advanced Directives  Does Patient Have a Medical Advance Directive? Yes  Type of Paramedic of [place];Living will  Does patient want to make changes to medical advance directive? No - Patient declined  Copy of [place] in Chart? No - copy requested     Allergies  Allergen Reactions   Adhesive [Tape] Rash   Celebrex [Celecoxib] Hives   Ciprofibrate Nausea Only   Cymbalta [Duloxetine Hcl] Swelling   Gabitril [Tiagabine] Swelling   Lyrica [Pregabalin] Swelling   Neurontin [Gabapentin] Swelling   Nexium [Esomeprazole] Rash   Nsaids Rash   Shrimp [Shellfish Allergy] Anaphylaxis    Per patient "shrimp only"   Azactam [Aztreonam]     Hand swelling    Azelastine Hcl     Rash    Ciprofloxacin Other (See Comments)    dizziness   Claritin [Loratadine]     Irritability Nervousness    Methotrexate Derivatives    Nasacort [Triamcinolone]     Dizzy    Olopatadine Other (See Comments)    Pain and lethargy    Other    Sulfamethizole Other (See Comments)    unknown   Zantac [Ranitidine Hcl] Other (See Comments)    unknown   Claritin-D 12 Hour [Loratadine-Pseudoephedrine Er] Anxiety   Keflex [Cephalexin] Nausea And Vomiting    Tolerated Ancef   Penicillins Rash    Injection site reaction. Tolerated cefepime in past. Also reports tolerating a penicillin infusion after this initial rxn ~20 yrs ago.  Has patient had a PCN reaction causing immediate rash,  facial/tongue/throat swelling, SOB or lightheadedness with hypotension: No Has patient had a PCN reaction causing severe rash involving mucus membranes or skin necrosis: No Has patient had a PCN reaction that required hospitalization No Has patient had a PCN reaction occurring within the last 10 years: No     Sulfa Antibiotics Nausea And Vomiting    Chief Complaint  Patient presents with   Acute Visit    Discharge to [place] [facility]    HPI:  88 y.o. female with medical history significant of chronic diastolic heart failure, sigmoid diverticulitis with pneumoperitoneum, asthma, RA/fibromyalgia, GERD, edema osteoporosis, HLD, gout, DVT was admitted to [facility] for therapy following her hospital stay.     Hospitalized 10/27/22-11/22/3 for sepsis, UTI, fully treated with IVF, antibiotics, her mentation has returned to her baseline, she is afebrile, CXR no evidence of acute infection.   The patient has received PT, OT, ST evaluation and treatment, she has regained physical strength, ADL function. The patient is ready to return to [place] for continue therapy.               Chronic diastolic heart failure, DOE, on and off wheezing, cough, occasional yellow phlegm in am. CXR no evidence of acute infection 10/2022. EF 60-65% 05/22/22             Hx of sinus tachycardia, heart rate is in control.  Hx of aute sigmoid diverticulitis with pneumoperitoneum, f/u Dr. [name], diarrhea on and off, negative C-diff while in hospital.              Asthma Epinephrine prn, Allegra, Flonase, Singulair             RA/fibromyalgia,  takes Prednisone, Tramadol, TSH 0.865 02/27/22             GERD off acid reducer. Hgb 11.0 10/30/22             Venous insufficiency/Edema , takes Furosemide, Kcl. Bun/creat 11/0.94 10/30/22, K 4.0 10/31/22             OP Rheumatology, off Alendronate, on Vit D, t score -1.8 06/06/21             Hyperlipidemia, takes Atorvastatin             Vit B12 deficiency, on Vit B12 po, Vit  B12 235 06/20/21             CT left adnexa cyst 11/2020, MRI 05/08/21,  05/22/22 renal US shoed No evidence of renal mass or hydronephrosis.             Gout, R ankle, stable, takes Colchicine             Hx of DVT, venous US BLE was negative for DVT while in hospital. Off Eliquis.     Past Medical History:  Diagnosis Date   Adrenal failure ([place])    Arthritis    Asthma    Cancer ([place])    Cataract    Closed nondisplaced fracture of fifth right metatarsal bone 09/18/2017   Diverticulitis    Per patient   E coli bacteremia    Fibromyalgia 2008   GERD (gastroesophageal reflux disease) 12/18/2016   HCAP (healthcare-associated pneumonia) 02/03/2018   Hyperlipidemia 10/13/2021   Osteoporosis    RA (rheumatoid arthritis) ([place])    Recurrent upper respiratory infection (URI)    Sepsis due to urinary tract infection ([place]) 01/18/2018   Urticaria     Past Surgical History:  Procedure Laterality Date   BACK SURGERY     BILATERAL CARPAL TUNNEL RELEASE  2005   right and left   CATARACT EXTRACTION, BILATERAL  2004   right and left   CERVICAL FUSION  2011,2010,2008   2 disks   HEEL SPUR SURGERY  2004   lower back fusion  2011   Fusion of 3-4 and 4-5 lower back   RADIOFREQUENCY ABLATION  2020   ROTATOR CUFF REPAIR  [id number]   SQUAMOUS CELL CARCINOMA EXCISION     TONSILLECTOMY AND ADENOIDECTOMY  1947   TOTAL SHOULDER ARTHROPLASTY        reports that she quit smoking about 55 years ago. Her smoking use included cigarettes. She has a 7.50 pack-year smoking history. She has never used smokeless tobacco. She reports that she does not drink alcohol and does not use drugs. Social History   Socioeconomic History   Marital status: Widowed    Spouse name: Not on file   Number of children: 2   Years of education: Not on file   Highest education level: Not on file  Occupational History   Occupation: Retired Education officer, museum  Tobacco Use   Smoking status: Former    Packs/day: 0.75     Years: 10.00    Total pack years: 7.50    Types: Cigarettes    Quit date: 1968    Years since quitting: 28.9  Smokeless tobacco: Never  Vaping Use   Vaping Use: Never used  Substance and Sexual Activity   Alcohol use: No   Drug use: No   Sexual activity: Not Currently  Other Topics Concern   Not on file  Social History Narrative      Diet:        Do you drink/ eat things with caffeine? Dr. Malachi Bonds 2/ day      Marital status: Widowed                              What year were you married ? 1953      Do you live in a house, apartment,assistred living, condo, trailer, etc.)? Apartment      Is it one or more stories?       How many persons live in your home ? 1      Do you have any pets in your home ?(please list) No      Highest Level of education completed: PhD       Current or past profession: Transport planner, Education officer, museum, Special Educator       Do you exercise?   No                           Type & how often       ADVANCED DIRECTIVES (Please bring copies)      Do you have a living will? Yes      Do you have a DNR form?                       If not, do you want to discuss one? Yes      Do you have signed POA?HPOA forms?                 If so, please bring to your appointment Tes      FUNCTIONAL STATUS- To be completed by Spouse / child / Staff       Do you have difficulty bathing or dressing yourself ? No      Do you have difficulty preparing food or eating ? No      Do you have difficulty managing your mediation ? No      Do you have difficulty managing your finances ? No      Do you have difficulty affording your medication ? No      Social Determinants of Health   Financial Resource Strain: Not on file  Food Insecurity: No Food Insecurity (10/27/2022)   Hunger Vital Sign    Worried About Running Out of Food in the Last Year: Never true    Ran Out of Food in the Last Year: Never true  Transportation Needs: No Transportation Needs (10/27/2022)   PRAPARE -  Hydrologist (Medical): No    Lack of Transportation (Non-Medical): No  Physical Activity: Not on file  Stress: Not on file  Social Connections: Not on file  Intimate Partner Violence: Not At Risk (10/27/2022)   Humiliation, Afraid, Rape, and Kick questionnaire    Fear of Current or Ex-Partner: No    Emotionally Abused: No    Physically Abused: No    Sexually Abused: No   Functional Status Survey:    Allergies  Allergen Reactions   Adhesive [Tape] Rash   Celebrex [Celecoxib] Hives  Ciprofibrate Nausea Only   Cymbalta [Duloxetine Hcl] Swelling   Gabitril [Tiagabine] Swelling   Lyrica [Pregabalin] Swelling   Neurontin [Gabapentin] Swelling   Nexium [Esomeprazole] Rash   Nsaids Rash   Shrimp [Shellfish Allergy] Anaphylaxis    Per patient "shrimp only"   Azactam [Aztreonam]     Hand swelling    Azelastine Hcl     Rash    Ciprofloxacin Other (See Comments)    dizziness   Claritin [Loratadine]     Irritability Nervousness    Methotrexate Derivatives    Nasacort [Triamcinolone]     Dizzy    Olopatadine Other (See Comments)    Pain and lethargy    Other    Sulfamethizole Other (See Comments)    unknown   Zantac [Ranitidine Hcl] Other (See Comments)    unknown   Claritin-D 12 Hour [Loratadine-Pseudoephedrine Er] Anxiety   Keflex [Cephalexin] Nausea And Vomiting    Tolerated Ancef   Penicillins Rash    Injection site reaction. Tolerated cefepime in past. Also reports tolerating a penicillin infusion after this initial rxn ~20 yrs ago.  Has patient had a PCN reaction causing immediate rash, facial/tongue/throat swelling, SOB or lightheadedness with hypotension: No Has patient had a PCN reaction causing severe rash involving mucus membranes or skin necrosis: No Has patient had a PCN reaction that required hospitalization No Has patient had a PCN reaction occurring within the last 10 years: No     Sulfa Antibiotics Nausea And Vomiting     Pertinent  Health Maintenance Due  Topic Date Due   INFLUENZA VACCINE  07/11/2022   DEXA SCAN  Completed    Medications: Allergies as of 11/17/2022       Reactions   Adhesive [tape] Rash   Celebrex [celecoxib] Hives   Ciprofibrate Nausea Only   Cymbalta [duloxetine Hcl] Swelling   Gabitril [tiagabine] Swelling   Lyrica [pregabalin] Swelling   Neurontin [gabapentin] Swelling   Nexium [esomeprazole] Rash   Nsaids Rash   Shrimp [shellfish Allergy] Anaphylaxis   Per patient "shrimp only"   Azactam [aztreonam]    Hand swelling    Azelastine Hcl    Rash    Ciprofloxacin Other (See Comments)   dizziness   Claritin [loratadine]    Irritability Nervousness    Methotrexate Derivatives    Nasacort [triamcinolone]    Dizzy    Olopatadine Other (See Comments)   Pain and lethargy    Other    Sulfamethizole Other (See Comments)   unknown   Zantac [ranitidine Hcl] Other (See Comments)   unknown   Claritin-d 12 Hour [loratadine-pseudoephedrine Er] Anxiety   Keflex [cephalexin] Nausea And Vomiting   Tolerated Ancef   Penicillins Rash   Injection site reaction. Tolerated cefepime in past. Also reports tolerating a penicillin infusion after this initial rxn ~20 yrs ago.  Has patient had a PCN reaction causing immediate rash, facial/tongue/throat swelling, SOB or lightheadedness with hypotension: No Has patient had a PCN reaction causing severe rash involving mucus membranes or skin necrosis: No Has patient had a PCN reaction that required hospitalization No Has patient had a PCN reaction occurring within the last 10 years: No   Sulfa Antibiotics Nausea And Vomiting        Medication List        Accurate as of November 17, 2022  3:32 PM. If you have any questions, ask your nurse or doctor.          atorvastatin 20 MG tablet Commonly known as:  LIPITOR TAKE 1 TABLET BY MOUTH EVERY DAY   Biotin 5 MG Tbdp Take 1 tablet (5 mg total) by mouth daily.   colchicine 0.6 MG  tablet TAKE 1 TABLET BY MOUTH EVERY DAY   CRANBERRY PO Take 1 capsule by mouth daily.   cyanocobalamin 1000 MCG tablet Commonly known as: VITAMIN B12 Take 1 tablet (1,000 mcg total) by mouth daily.   cycloSPORINE 0.05 % ophthalmic emulsion Commonly known as: RESTASIS Place 1 drop into both eyes 2 (two) times daily.   diclofenac Sodium 1 % Gel Commonly known as: VOLTAREN APPLY 2 TO 4 GRAMS TOPICALLY TO AFFECTED JOINTS UP TO 4 TIMES DAILY What changed:  how much to take how to take this when to take this reasons to take this   fexofenadine 180 MG tablet Commonly known as: ALLEGRA Take 1 tablet (180 mg total) by mouth daily.   furosemide 80 MG tablet Commonly known as: LASIX Take 1 tablet (80 mg total) by mouth daily.   lansoprazole 30 MG capsule Commonly known as: PREVACID TAKE 1 CAPSULE BY MOUTH ONCE DAILY AT NOON What changed: See the new instructions.   Melatonin 10 MG Tabs Take 10 mg by mouth at bedtime.   montelukast 10 MG tablet Commonly known as: SINGULAIR TAKE 1 TABLET BY MOUTH EVERYDAY AT BEDTIME What changed: See the new instructions.   potassium chloride 10 MEQ CR capsule Commonly known as: MICRO-K Take 2 capsules (20 mEq total) by mouth 2 (two) times daily.   predniSONE 10 MG tablet Commonly known as: DELTASONE TAKE 1 TABLET (10 MG TOTAL) BY MOUTH DAILY WITH BREAKFAST.   Vitamin D3 125 MCG (5000 UT) Caps TAKE 1 CAPSULE BY MOUTH EVERY DAY        Review of Systems  Constitutional:  Negative for appetite change, fatigue and fever.  HENT:  Negative for congestion, postnasal drip, sinus pressure and sore throat.   Eyes:  Negative for visual disturbance.  Respiratory:  Positive for cough and shortness of breath. Negative for chest tightness and wheezing.        DOE, occasional yellow phlegm in am, hacking cough.   Cardiovascular:  Positive for leg swelling.  Gastrointestinal:  Negative for abdominal pain and constipation.       On and off  diarrhea.   Genitourinary:  Negative for dysuria and urgency.       Incontinent of urine.   Musculoskeletal:  Positive for arthralgias, back pain, gait problem and myalgias.       Right lower back hip pain, travels down to the right leg.  Skin:  Negative for color change.  Neurological:  Negative for speech difficulty, weakness and light-headedness.  Psychiatric/Behavioral:  Negative for confusion and sleep disturbance. The patient is not nervous/anxious.     Vitals:   11/17/22 1513  BP: 136/88  Pulse: 91  Resp: 17  Temp: (!) 97.4 F (36.3 C)  SpO2: 97%  Weight: 185 lb 3.2 oz (84 kg)   Body mass index is 33.33 kg/m. Physical Exam Vitals and nursing note reviewed.  Constitutional:      Appearance: Normal appearance.  HENT:     Head: Normocephalic and atraumatic.     Ears:     Comments: A small abrasion from hearing aid at the opening of the right external ear canal, not infected.     Nose: Nose normal. No rhinorrhea.     Mouth/Throat:     Mouth: Mucous membranes are moist.     Pharynx: No oropharyngeal  exudate or posterior oropharyngeal erythema.  Eyes:     Extraocular Movements: Extraocular movements intact.     Conjunctiva/sclera: Conjunctivae normal.     Pupils: Pupils are equal, round, and reactive to light.  Cardiovascular:     Rate and Rhythm: Normal rate and regular rhythm.     Heart sounds: No murmur heard. Pulmonary:     Effort: Pulmonary effort is normal.     Breath sounds: Rales present.     Comments: Bibasilar rales. Abdominal:     General: Bowel sounds are normal.     Palpations: Abdomen is soft.     Tenderness: There is no abdominal tenderness.  Musculoskeletal:     Cervical back: Normal range of motion and neck supple.     Right lower leg: Edema present.     Left lower leg: Edema present.     Comments: 1+ edema BLE.   Skin:    General: Skin is warm and dry.     Comments: Lots of moles.   Neurological:     General: No focal deficit present.      Mental Status: She is alert and oriented to person, place, and time. Mental status is at baseline.     Gait: Gait abnormal.  Psychiatric:        Mood and Affect: Mood normal.        Behavior: Behavior normal.        Thought Content: Thought content normal.        Judgment: Judgment normal.     Labs reviewed: Basic Metabolic Panel: Recent Labs    05/28/22 0551 05/29/22 0419 05/30/22 0514 10/27/22 0945 10/28/22 0357 10/29/22 0419 10/30/22 1216 10/31/22 0415  NA 140 140 143   < > 143 144 143  --   K 3.3* 4.1 3.9   < > 3.3* 2.9* 3.1* 4.0  CL 99 102 104   < > 109 111 109  --   CO2 '30 29 30   '$ < > '27 24 24  '$ --   GLUCOSE 114* 178* 157*   < > 152* 103* 98  --   BUN 9 20 24*   < > '10 13 11  '$ --   CREATININE 0.83 0.85 0.87   < > 1.05* 0.84 0.94  --   CALCIUM 9.5 9.5 9.1   < > 8.9 8.8* 8.6*  --   MG 2.0 2.3 2.2  --   --   --   --   --    < > = values in this interval not displayed.   Liver Function Tests: Recent Labs    05/27/22 0543 10/27/22 0945 10/28/22 0357  AST 14* 25 19  ALT '10 17 14  '$ ALKPHOS 49 61 47  BILITOT 0.8 1.4* 1.1  PROT 6.1* 7.6 6.3*  ALBUMIN 2.9* 3.7 2.7*   Recent Labs    05/21/22 1205  LIPASE 22   No results for input(s): "AMMONIA" in the last 8760 hours. CBC: Recent Labs    10/27/22 0945 10/28/22 0357 10/29/22 0343 10/30/22 1216  WBC 18.6* 17.1* 13.6* 11.9*  NEUTROABS 12.7* 14.6* 10.1*  --   HGB 13.0 10.9* 9.8* 11.0*  HCT 41.9 35.8* 32.8* 37.5  MCV 97.2 98.6 99.7 103.0*  PLT 280 240 221 246   Cardiac Enzymes: No results for input(s): "CKTOTAL", "CKMB", "CKMBINDEX", "TROPONINI" in the last 8760 hours. BNP: Invalid input(s): "POCBNP" CBG: Recent Labs    05/21/22 1727  GLUCAP 167*    Procedures and Imaging Studies  During Stay: DG Foot Complete Left  Result Date: 10/30/2022 CLINICAL DATA:  Left foot pain and swelling. EXAM: LEFT FOOT - COMPLETE 3+ VIEW COMPARISON:  Left foot radiographs 05/27/2020 FINDINGS: Minimal distal lateral  cuboid degenerative spurring at the calcaneocuboid joint. Minimal joint space narrowing of the interphalangeal joints diffusely. Tiny plantar and posterior calcaneal heel spurs. No acute fracture or dislocation. No cortical erosion or periostitis. IMPRESSION: 1. Minimal osteoarthritis of the interphalangeal joints diffusely. 2. Tiny plantar and posterior calcaneal heel spurs. Electronically Signed   By: Yvonne Kendall M.D.   On: 10/30/2022 13:55   DG CHEST PORT 1 VIEW  Result Date: 10/30/2022 CLINICAL DATA:  Rales EXAM: PORTABLE CHEST 1 VIEW COMPARISON:  Radiograph 10/27/2022 FINDINGS: Unchanged cardiomediastinal silhouette. Low lung volumes. Bibasilar subsegmental atelectasis. Airspace consolidation. No pleural effusion or pneumothorax. No acute osseous abnormality. Thoracic spondylosis. Cervical spine fusion hardware noted. Bilateral shoulder degenerative changes. IMPRESSION: Low lung volumes with bibasilar subsegmental atelectasis. No airspace consolidation. Electronically Signed   By: Maurine Simmering M.D.   On: 10/30/2022 11:59   VAS [place] LOWER EXTREMITY VENOUS (DVT)  Result Date: 10/28/2022  Lower Venous DVT Study Patient Name:  PABLO MATHURIN  Date of Exam:   10/28/2022 Medical Rec #: [id number]           Accession #:    [id number] Date of Birth: September 24, 1932           Patient Gender: F Patient Age:   88 years Exam Location:  [facility] Procedure:      VAS [place] LOWER EXTREMITY VENOUS (DVT) Referring Phys: RALPH NETTEY --------------------------------------------------------------------------------  Indications: RLE pain.  Risk Factors: DVT 05/2022 RLE. Limitations: Poor ultrasound/tissue interface. Comparison Study: Previous exam on 05/26/22 was positive for DVT in RLE posterior                   tibial vein. Performing Technologist: Rogelia Rohrer RVT, RDMS  Examination Guidelines: A complete evaluation includes B-mode imaging, spectral Doppler, color Doppler, and power Doppler as needed of all accessible  portions of each vessel. Bilateral testing is considered an integral part of a complete examination. Limited examinations for reoccurring indications may be performed as noted. The reflux portion of the exam is performed with the patient in reverse Trendelenburg.  +---------+---------------+---------+-----------+----------+--------------+ RIGHT    CompressibilityPhasicitySpontaneityPropertiesThrombus Aging +---------+---------------+---------+-----------+----------+--------------+ CFV      Full           Yes      Yes                                 +---------+---------------+---------+-----------+----------+--------------+ SFJ      Full                                                        +---------+---------------+---------+-----------+----------+--------------+ FV Prox  Full           Yes      Yes                                 +---------+---------------+---------+-----------+----------+--------------+ FV Mid   Full           Yes      Yes                                 +---------+---------------+---------+-----------+----------+--------------+  FV DistalFull           Yes      Yes                                 +---------+---------------+---------+-----------+----------+--------------+ PFV      Full                                                        +---------+---------------+---------+-----------+----------+--------------+ POP      Full           Yes      Yes                                 +---------+---------------+---------+-----------+----------+--------------+ PTV      Full                                                        +---------+---------------+---------+-----------+----------+--------------+ PERO     Full                                                        +---------+---------------+---------+-----------+----------+--------------+   +---------+---------------+---------+-----------+----------+--------------+ LEFT      CompressibilityPhasicitySpontaneityPropertiesThrombus Aging +---------+---------------+---------+-----------+----------+--------------+ CFV      Full           Yes      Yes                                 +---------+---------------+---------+-----------+----------+--------------+ SFJ      Full                                                        +---------+---------------+---------+-----------+----------+--------------+ FV Prox  Full           Yes      Yes                                 +---------+---------------+---------+-----------+----------+--------------+ FV Mid   Full           Yes      Yes                                 +---------+---------------+---------+-----------+----------+--------------+ FV DistalFull           Yes      Yes                                 +---------+---------------+---------+-----------+----------+--------------+ PFV      Full                                                        +---------+---------------+---------+-----------+----------+--------------+  POP      Full           Yes      Yes                                 +---------+---------------+---------+-----------+----------+--------------+ PTV      Full                                                        +---------+---------------+---------+-----------+----------+--------------+ PERO     Full                                                        +---------+---------------+---------+-----------+----------+--------------+     Summary: BILATERAL: - No evidence of deep vein thrombosis seen in the lower extremities, bilaterally. -No evidence of popliteal cyst, bilaterally.   *See table(s) above for measurements and observations. Electronically signed by Deitra Mayo MD on 10/28/2022 at 3:17:57 PM.    Final    DG Chest 2 View  Result Date: 10/27/2022 CLINICAL DATA:  Altered mental status. Hypoxia. Productive cough. Suspected sepsis. EXAM: CHEST  - 2 VIEW COMPARISON:  05/21/2022 FINDINGS: The heart size and mediastinal contours are within normal limits. Mild elevation of left hemidiaphragm is stable. Both lungs are clear. Cervical spine fusion hardware again noted. IMPRESSION: Stable exam. No active cardiopulmonary disease. Electronically Signed   By: Marlaine Hind M.D.   On: 10/27/2022 10:42    Assessment/Plan:   Edema of both lower extremities due to peripheral venous insufficiency takes Furosemide, Kcl. Bun/creat 11/0.94 10/30/22, K 4.0 10/31/22, underwent cardiology evaluation, no s/s of developing CHF decompensation.   Age-related osteoporosis without current pathological fracture off Alendronate, on Vit D, t score -1.8 06/06/21  Hyperlipidemia takes Atorvastatin  B12 deficiency on Vit B12 po, Vit B12 235 06/20/21  Gout of right ankle R ankle, stable, takes Colchicine  DVT (deep venous thrombosis) (HCC) Hx of DVT, venous US BLE was negative for DVT while in hospital. Off Eliquis.   GERD (gastroesophageal reflux disease) Stable, off acid reducer. Hgb 11.0 10/30/22  Rheumatoid arthritis (HCC) RA/fibromyalgia,  takes Prednisone, Tramadol, TSH 0.865 02/27/22  Asthma Stable,  Epinephrine prn, Allegra, Flonase, Singulair  Perforation of sigmoid colon due to diverticulitis Hx of aute sigmoid diverticulitis with pneumoperitoneum, f/u Dr. [name], diarrhea on and off, negative C-diff while in hospital.   Sinus tachycardia Hx of sinus tachycardia, heart rate is in control.   Chronic diastolic heart failure (HCC) Chronic diastolic heart failure, DOE, on and off wheezing, cough, occasional yellow phlegm in am. CXR no evidence of acute infection 10/2022. EF 60-65% 05/22/22   Patient is being discharged with the following home health services:    Patient is being discharged with the following durable medical equipment:    Patient has been advised to f/u with their PCP in 1-2 weeks to bring them up to date on their rehab stay.   Social services at facility was responsible for arranging this appointment.  Pt was provided with a 30 day supply of prescriptions for medications and refills must be obtained from their PCP.  For controlled  substances, a more limited supply may be provided adequate until PCP appointment only.  Future labs/tests needed:  prn

## 2022-11-17 NOTE — Assessment & Plan Note (Signed)
Hx of sinus tachycardia, heart rate is in control.  

## 2022-11-17 NOTE — Assessment & Plan Note (Signed)
Stable,  Epinephrine prn, Allegra, Flonase, Singulair

## 2022-11-17 NOTE — Assessment & Plan Note (Signed)
off Alendronate, on Vit D, t score -1.8 06/06/21

## 2022-11-17 NOTE — Assessment & Plan Note (Signed)
takes Atorvastatin  

## 2022-11-17 NOTE — Assessment & Plan Note (Signed)
Hx of aute sigmoid diverticulitis with pneumoperitoneum, f/u Dr. Kinsinger, diarrhea on and off, negative C-diff while in hospital.  

## 2022-11-17 NOTE — Assessment & Plan Note (Signed)
Hx of DVT, venous US BLE was negative for DVT while in hospital. Off Eliquis.

## 2022-11-20 ENCOUNTER — Other Ambulatory Visit: Payer: Self-pay | Admitting: Nurse Practitioner

## 2022-11-20 DIAGNOSIS — M79604 Pain in right leg: Secondary | ICD-10-CM

## 2022-11-20 DIAGNOSIS — M0579 Rheumatoid arthritis with rheumatoid factor of multiple sites without organ or systems involvement: Secondary | ICD-10-CM

## 2022-11-24 ENCOUNTER — Other Ambulatory Visit: Payer: Self-pay | Admitting: Nurse Practitioner

## 2022-11-24 DIAGNOSIS — M79604 Pain in right leg: Secondary | ICD-10-CM

## 2022-11-27 ENCOUNTER — Other Ambulatory Visit: Payer: Self-pay | Admitting: Nurse Practitioner

## 2022-11-27 DIAGNOSIS — M0579 Rheumatoid arthritis with rheumatoid factor of multiple sites without organ or systems involvement: Secondary | ICD-10-CM

## 2022-12-06 ENCOUNTER — Other Ambulatory Visit: Payer: Self-pay | Admitting: Nurse Practitioner

## 2022-12-06 DIAGNOSIS — M79604 Pain in right leg: Secondary | ICD-10-CM

## 2022-12-06 NOTE — Telephone Encounter (Signed)
Patient has request refill on medication Tramadol '100mg'$ . Patient also has Tramadol '50mg'$  on medication list. Patient has Non Opioid Contract on file dated 07/20/2020. Medication pend and sent to PCP Mast, [name], NP for approval.

## 2022-12-26 ENCOUNTER — Other Ambulatory Visit: Payer: Self-pay | Admitting: Nurse Practitioner

## 2022-12-26 DIAGNOSIS — M79605 Pain in left leg: Secondary | ICD-10-CM

## 2022-12-27 ENCOUNTER — Non-Acute Institutional Stay (INDEPENDENT_AMBULATORY_CARE_PROVIDER_SITE_OTHER): Payer: Medicare PPO | Admitting: Family Medicine

## 2022-12-27 ENCOUNTER — Encounter: Payer: Self-pay | Admitting: Family Medicine

## 2022-12-27 VITALS — BP 138/86 | HR 86 | Temp 96.8°F | Ht 62.5 in | Wt 184.4 lb

## 2022-12-27 DIAGNOSIS — M0579 Rheumatoid arthritis with rheumatoid factor of multiple sites without organ or systems involvement: Secondary | ICD-10-CM | POA: Diagnosis not present

## 2022-12-27 MED ORDER — PREDNISONE 10 MG PO TABS: 10.0000 mg | ORAL_TABLET | Freq: Every day | ORAL | 0 refills | Status: DC

## 2022-12-27 NOTE — Progress Notes (Signed)
Provider:  Alain Honey, MD  Careteam: [name], [name], [name]  PLACE OF SERVICE:  [place]  Advanced Directive information    Allergies  Allergen Reactions   Adhesive [Tape] Rash   Celebrex [Celecoxib] Hives   Ciprofibrate Nausea Only   Cymbalta [Duloxetine Hcl] Swelling   Gabitril [Tiagabine] Swelling   Lyrica [Pregabalin] Swelling   Neurontin [Gabapentin] Swelling   Nexium [Esomeprazole] Rash   Nsaids Rash   Shrimp [Shellfish Allergy] Anaphylaxis    Per patient "shrimp only"   Azactam [Aztreonam]     Hand swelling    Azelastine Hcl     Rash    Ciprofloxacin Other (See Comments)    dizziness   Claritin [Loratadine]     Irritability Nervousness    Methotrexate Derivatives    Nasacort [Triamcinolone]     Dizzy    Olopatadine Other (See Comments)    Pain and lethargy    Other    Sulfamethizole Other (See Comments)    unknown   Zantac [Ranitidine Hcl] Other (See Comments)    unknown   Claritin-D 12 Hour [Loratadine-Pseudoephedrine Er] Anxiety   Keflex [Cephalexin] Nausea And Vomiting    Tolerated Ancef   Penicillins Rash    Injection site reaction. Tolerated cefepime in past. Also reports tolerating a penicillin infusion after this initial rxn ~20 yrs ago.  Has patient had a PCN reaction causing immediate rash, facial/tongue/throat swelling, SOB or lightheadedness with hypotension: No Has patient had a PCN reaction causing severe rash involving mucus membranes or skin necrosis: No Has patient had a PCN reaction that required hospitalization No Has patient had a PCN reaction occurring within the last 10 years: No     Sulfa Antibiotics Nausea And Vomiting    No chief complaint on file.    HPI: Patient is a 89 y.o. female patient presents today with pain and swelling in her right hand.  She has a history of rheumatoid arthritis, rheumatoid factor positive.  She takes 10 mg/day and is followed sometimes  by rheumatology.  There is been no injury or trauma to the hand.  It involves the MP joints on the right hand specifically the second third and fourth joints.  She has been taking tramadol 100 mg in the morning and then 50 mg several more times during the day.  Tramadol does provide some relief but the effect soon wears off.  Review of Systems:  Review of Systems  Constitutional: Negative.   Respiratory: Negative.    Cardiovascular: Negative.   Musculoskeletal:  Positive for joint pain.  Neurological: Negative.   Psychiatric/Behavioral: Negative.    All other systems reviewed and are negative.   Past Medical History:  Diagnosis Date   Adrenal failure ([place])    Arthritis    Asthma    Cancer ([place])    Cataract    Closed nondisplaced fracture of fifth right metatarsal bone 09/18/2017   Diverticulitis    Per patient   E coli bacteremia    Fibromyalgia 2008   GERD (gastroesophageal reflux disease) 12/18/2016   HCAP (healthcare-associated pneumonia) 02/03/2018   Hyperlipidemia 10/13/2021   Osteoporosis    RA (rheumatoid arthritis) ([place])    Recurrent upper respiratory infection (URI)    Sepsis due to urinary tract infection ([place]) 01/18/2018   Urticaria    Past Surgical History:  Procedure Laterality Date   BACK SURGERY     BILATERAL CARPAL TUNNEL RELEASE  2005  right and left   CATARACT EXTRACTION, BILATERAL  2004   right and left   CERVICAL FUSION  2011,2010,2008   2 disks   HEEL SPUR SURGERY  2004   lower back fusion  2011   Fusion of 3-4 and 4-5 lower back   RADIOFREQUENCY ABLATION  2020   ROTATOR CUFF REPAIR  [id number]   SQUAMOUS CELL CARCINOMA EXCISION     TONSILLECTOMY AND ADENOIDECTOMY  1947   TOTAL SHOULDER ARTHROPLASTY     Social History:   reports that she quit smoking about 56 years ago. Her smoking use included cigarettes. She has a 7.50 pack-year smoking history. She has never used smokeless tobacco. She reports that she does not drink alcohol and does not  use drugs.  Family History  Problem Relation Age of Onset   Heart attack Maternal Grandmother    Heart attack Paternal Grandfather    Breast cancer Mother 11   Diabetes Father    Heart disease Father    Congestive Heart Failure Father 54       Died from   Allergic rhinitis Neg Hx    Asthma Neg Hx    Eczema Neg Hx    Urticaria Neg Hx     Medications: Patient's Medications  New Prescriptions   No medications on file  Previous Medications   ATORVASTATIN (LIPITOR) 20 MG TABLET    TAKE 1 TABLET BY MOUTH EVERY DAY   BIOTIN 5 MG TBDP    Take 1 tablet (5 mg total) by mouth daily.   CHOLECALCIFEROL (VITAMIN D3) 125 MCG (5000 UT) CAPS    TAKE 1 CAPSULE BY MOUTH EVERY DAY   COLCHICINE 0.6 MG TABLET    TAKE 1 TABLET BY MOUTH EVERY DAY   CRANBERRY PO    Take 1 capsule by mouth daily.   CYCLOSPORINE (RESTASIS) 0.05 % OPHTHALMIC EMULSION    Place 1 drop into both eyes 2 (two) times daily.   DICLOFENAC SODIUM (VOLTAREN) 1 % GEL    APPLY 2 TO 4 GRAMS TOPICALLY TO AFFECTED JOINTS UP TO 4 TIMES DAILY   FEXOFENADINE (ALLEGRA) 180 MG TABLET    Take 1 tablet (180 mg total) by mouth daily.   FUROSEMIDE (LASIX) 80 MG TABLET    Take 1 tablet (80 mg total) by mouth daily.   LANSOPRAZOLE (PREVACID) 30 MG CAPSULE    TAKE 1 CAPSULE BY MOUTH ONCE DAILY AT NOON   MELATONIN 10 MG TABS    Take 10 mg by mouth at bedtime.   MONTELUKAST (SINGULAIR) 10 MG TABLET    TAKE 1 TABLET BY MOUTH EVERYDAY AT BEDTIME   POTASSIUM CHLORIDE (MICRO-K) 10 MEQ CR CAPSULE    Take 2 capsules (20 mEq total) by mouth 2 (two) times daily.   PREDNISONE (DELTASONE) 10 MG TABLET    TAKE 1 TABLET (10 MG TOTAL) BY MOUTH DAILY WITH BREAKFAST.   TRAMADOL (ULTRAM) 50 MG TABLET    TAKE 1 TABLET BY MOUTH FOUR TIMES A DAY   TRAMADOL HCL 100 MG TABS    Take 1 tablet by mouth once daily   VITAMIN B-12 (CYANOCOBALAMIN) 1000 MCG TABLET    Take 1 tablet (1,000 mcg total) by mouth daily.  Modified Medications   No medications on file  Discontinued  Medications   No medications on file    Physical Exam:  There were no vitals filed for this visit. There is no height or weight on file to calculate BMI. Wt Readings from Last 3 Encounters:  11/17/22 185 lb 3.2 oz (84 kg)  11/17/22 185 lb 3.2 oz (84 kg)  11/06/22 179 lb 4.8 oz (81.3 kg)    Physical Exam Vitals and nursing note reviewed.  Constitutional:      Appearance: Normal appearance.  Cardiovascular:     Rate and Rhythm: Normal rate.  Pulmonary:     Effort: Pulmonary effort is normal.  Musculoskeletal:        General: Swelling and tenderness present.     Comments: MP joints 2 through 4 are red and swollen on the right hand.  There is increased warmth. This likely represents a flare of her arthritis but I cannot rule out gout. There is a questionable history of occurrence of gout when she was recently hospitalized.  Neurological:     Mental Status: She is alert.     Labs reviewed: Basic Metabolic Panel: Recent Labs    02/27/22 1635 05/21/22 1205 05/28/22 0551 05/29/22 0419 05/30/22 0514 10/27/22 0945 10/28/22 0357 10/29/22 0419 10/30/22 1216 10/31/22 0415  NA  --    < > 140 140 143   < > 143 144 143  --   K  --    < > 3.3* 4.1 3.9   < > 3.3* 2.9* 3.1* 4.0  CL  --    < > 99 102 104   < > 109 111 109  --   CO2  --    < > '30 29 30   '$ < > '27 24 24  '$ --   GLUCOSE  --    < > 114* 178* 157*   < > 152* 103* 98  --   BUN  --    < > 9 20 24*   < > '10 13 11  '$ --   CREATININE  --    < > 0.83 0.85 0.87   < > 1.05* 0.84 0.94  --   CALCIUM  --    < > 9.5 9.5 9.1   < > 8.9 8.8* 8.6*  --   MG  --    < > 2.0 2.3 2.2  --   --   --   --   --   TSH 0.865  --   --   --   --   --   --   --   --   --    < > = values in this interval not displayed.   Liver Function Tests: Recent Labs    05/27/22 0543 10/27/22 0945 10/28/22 0357  AST 14* 25 19  ALT '10 17 14  '$ ALKPHOS 49 61 47  BILITOT 0.8 1.4* 1.1  PROT 6.1* 7.6 6.3*  ALBUMIN 2.9* 3.7 2.7*   Recent Labs    05/21/22 1205   LIPASE 22   No results for input(s): "AMMONIA" in the last 8760 hours. CBC: Recent Labs    10/27/22 0945 10/28/22 0357 10/29/22 0343 10/30/22 1216  WBC 18.6* 17.1* 13.6* 11.9*  NEUTROABS 12.7* 14.6* 10.1*  --   HGB 13.0 10.9* 9.8* 11.0*  HCT 41.9 35.8* 32.8* 37.5  MCV 97.2 98.6 99.7 103.0*  PLT 280 240 221 246   Lipid Panel: No results for input(s): "CHOL", "HDL", "LDLCALC", "TRIG", "CHOLHDL", "LDLDIRECT" in the last 8760 hours. TSH: Recent Labs    02/27/22 1635  TSH 0.865   A1C: No results found for: "HGBA1C"   Assessment/Plan  1. Rheumatoid arthritis involving multiple sites with positive rheumatoid factor ([place]) Since she is already taking 10 mg  of prednisone per day will increase that to 40 mg for 2 days 30 mg for 2 days 20 mg for 2 days and then continue with 10 mg. Also suggested since her pain is worse at night take the tramadol 100 mg at bedtime and then the 50 mg 2-3 more times during the day as needed.  She may also try application of local heat and/or ice whichever is more effective. Keep appointment with rheumatologist that she has already made the end of this month   Alain Honey, MD [place] [phone]

## 2022-12-28 NOTE — Progress Notes (Unsigned)
Office Visit Note  Patient: Eileen Wilkerson             Date of Birth: 1932-09-06           MRN: [id number]             PCP: Mast, [name], NP Referring: Mast, [name], NP Visit Date: 01/10/2023 Occupation: '@GUAROCC'$ @  Subjective:  Pain in both hands  History of Present Illness: Eileen Wilkerson is a 89 y.o. female with history of seropositive rheumatoid arthritis and osteoarthritis.  She was last seen in the office on 02/03/21.  Patient reports that she has been under the care of Dr. Sabra Heck at [facility].  She has been taking prednisone 10 mg daily as a maintenance dose given her history of adrenal insufficiency.  Patient reports that she was seen by Dr. Sabra Heck on 12/27/2022 for rheumatoid arthritis flare involving the right hand.  At that time a prednisone taper starting at 40 mg tapering by 10 mg every 2 days until she reached her maintenance dose of 10 mg daily.  Patient reports that she tried taking 40 mg but had difficulty sleeping so she has reduced to 20 mg daily since then.  She states that the pain and swelling in her right hand has improved significantly but she still has some residual swelling.  She has been taking tramadol for pain relief.   Activities of Daily Living:  Patient reports morning stiffness for 2 hours.   Patient Denies nocturnal pain.  Difficulty dressing/grooming: Denies Difficulty climbing stairs: Reports Difficulty getting out of chair: Denies Difficulty using hands for taps, buttons, cutlery, and/or writing: Reports  Review of Systems  Constitutional:  Positive for fatigue.  HENT:  Positive for mouth dryness. Negative for mouth sores and nose dryness.   Eyes:  Positive for dryness. Negative for pain and visual disturbance.  Respiratory:  Negative for cough, hemoptysis, shortness of breath and difficulty breathing.   Cardiovascular:  Positive for palpitations. Negative for chest pain, hypertension and swelling in legs/feet.  Gastrointestinal:   Positive for constipation. Negative for blood in stool and diarrhea.  Endocrine: Negative for increased urination.  Genitourinary:  Positive for involuntary urination. Negative for painful urination.  Musculoskeletal:  Positive for joint pain, gait problem, joint pain, joint swelling and morning stiffness. Negative for myalgias, muscle weakness, muscle tenderness and myalgias.  Skin:  Positive for hair loss. Negative for color change, pallor, rash, nodules/bumps, skin tightness, ulcers and sensitivity to sunlight.  Allergic/Immunologic: Positive for susceptible to infections.  Neurological:  Negative for dizziness, numbness, headaches and weakness.  Hematological:  Negative for swollen glands.  Psychiatric/Behavioral:  Negative for depressed mood and sleep disturbance. The patient is not nervous/anxious.     PMFS History:  Patient Active Problem List   Diagnosis Date Noted   Chronic diastolic heart failure ([place]) 11/06/2022   Gout of right ankle 11/06/2022   Sepsis secondary to UTI ([place]) 10/27/2022   DVT (deep venous thrombosis) ([place]) 05/26/2022   E coli bacteremia    Sepsis ([place]) 05/21/2022   Diarrhea 05/21/2022   Gait abnormality 01/26/2022   Sinus tachycardia 10/27/2021   Weight gain 10/14/2021   Hyperlipidemia 10/13/2021   Dysuria 06/23/2021   Osteopenia 06/07/2021   Adnexal cyst 03/03/2021   AK (actinic keratosis) 03/03/2021   UTI (urinary tract infection) 07/22/2020   Allergic rhinitis 07/22/2020   Asthma 07/08/2020   Perforation of sigmoid colon due to diverticulitis 06/23/2020   Hair loss 08/06/2019   SOB (  shortness of breath) on exertion 08/06/2019   Pure hypercholesterolemia 08/06/2019   B12 deficiency 03/11/2018   E. coli UTI 01/18/2018   Edema of both lower extremities due to peripheral venous insufficiency 09/18/2017   Bilateral lower extremity edema 06/30/2017   Physical deconditioning 05/30/2017   Immunosuppressed status ([place]) 05/30/2017   Abnormal chest x-ray  05/17/2017   Hypokalemia 04/25/2017   Fibromyalgia 12/18/2016   DJD (degenerative joint disease), cervical 12/18/2016   Spondylosis of lumbar region without myelopathy or radiculopathy 12/18/2016   Trigger finger, right middle finger 12/18/2016   Primary osteoarthritis of both feet 12/18/2016   Primary osteoarthritis of both knees 12/18/2016   GERD (gastroesophageal reflux disease) 12/18/2016   Age-related osteoporosis without current pathological fracture 12/18/2016   Rheumatoid arthritis ([place]) 12/28/2015   Long term current use of systemic steroids, for RA 12/28/2015   Lymphocytosis 11/13/2012    Past Medical History:  Diagnosis Date   Adrenal failure ([place])    Arthritis    Asthma    Cancer ([place])    Cataract    Closed nondisplaced fracture of fifth right metatarsal bone 09/18/2017   Diverticulitis    Per patient   E coli bacteremia    Fibromyalgia 2008   GERD (gastroesophageal reflux disease) 12/18/2016   HCAP (healthcare-associated pneumonia) 02/03/2018   Hyperlipidemia 10/13/2021   Osteoporosis    RA (rheumatoid arthritis) (HCC)    Recurrent upper respiratory infection (URI)    Sepsis due to urinary tract infection ([place]) 01/18/2018   Urticaria     Family History  Problem Relation Age of Onset   Heart attack Maternal Grandmother    Heart attack Paternal Grandfather    Breast cancer Mother 74   Diabetes Father    Heart disease Father    Congestive Heart Failure Father 59       Died from   Allergic rhinitis Neg Hx    Asthma Neg Hx    Eczema Neg Hx    Urticaria Neg Hx    Past Surgical History:  Procedure Laterality Date   BACK SURGERY     BILATERAL CARPAL TUNNEL RELEASE  2005   right and left   CATARACT EXTRACTION, BILATERAL  2004   right and left   CERVICAL FUSION  2011,2010,2008   2 disks   HEEL SPUR SURGERY  2004   lower back fusion  2011   Fusion of 3-4 and 4-5 lower back   RADIOFREQUENCY ABLATION  2020   ROTATOR CUFF REPAIR  [id number]   SQUAMOUS  CELL CARCINOMA EXCISION     TONSILLECTOMY AND ADENOIDECTOMY  1947   TOTAL SHOULDER ARTHROPLASTY     Social History   Social History Narrative      Diet:        Do you drink/ eat things with caffeine? Dr. Malachi Bonds 2/ day      Marital status: Widowed                              What year were you married ? 1953      Do you live in a house, apartment,assistred living, condo, trailer, etc.)? Apartment      Is it one or more stories?       How many persons live in your home ? 1      Do you have any pets in your home ?(please list) No      Highest Level of education completed: PhD  Current or past profession: Transport planner, Education officer, museum, Special Educator       Do you exercise?   No                           Type & how often       ADVANCED DIRECTIVES (Please bring copies)      Do you have a living will? Yes      Do you have a DNR form?                       If not, do you want to discuss one? Yes      Do you have signed POA?HPOA forms?                 If so, please bring to your appointment Tes      FUNCTIONAL STATUS- To be completed by Spouse / child / Staff       Do you have difficulty bathing or dressing yourself ? No      Do you have difficulty preparing food or eating ? No      Do you have difficulty managing your mediation ? No      Do you have difficulty managing your finances ? No      Do you have difficulty affording your medication ? No      Immunization History  Administered Date(s) Administered   Fluad Quad(high Dose 65+) 09/30/2020   Influenza, High Dose Seasonal PF 08/16/2018, 09/08/2019, 09/24/2020, 10/04/2021   Influenza,inj,Quad PF,6+ Mos 08/11/2016   Influenza-Unspecified 11/12/2017, 10/05/2022   Moderna Covid-19 Vaccine Bivalent Booster 33yr & up 10/04/2021   Moderna Sars-Covid-2 Vaccination 12/15/2019, 01/12/2020, 10/19/2020   PNEUMOCOCCAL CONJUGATE-20 10/04/2021   PPD Test 03/01/2018   Pneumococcal Conjugate-13 06/26/2017   Pneumococcal  Polysaccharide-23 12/11/2009   Zoster Recombinat (Shingrix) 01/28/2019, 09/02/2019     Objective: Vital Signs: BP (!) 145/84 (BP Location: Left Arm, Patient Position: Sitting, Cuff Size: Normal)   Pulse 72   Resp 18   Ht 5' 2.5" (1.588 m)   Wt 181 lb (82.1 kg)   BMI 32.58 kg/m    Physical Exam Vitals and nursing note reviewed.  Constitutional:      Appearance: She is well-developed.  HENT:     Head: Normocephalic and atraumatic.  Eyes:     Conjunctiva/sclera: Conjunctivae normal.  Cardiovascular:     Rate and Rhythm: Normal rate and regular rhythm.     Heart sounds: Normal heart sounds.  Pulmonary:     Effort: Pulmonary effort is normal.     Breath sounds: Normal breath sounds.  Abdominal:     General: Bowel sounds are normal.     Palpations: Abdomen is soft.  Musculoskeletal:     Cervical back: Normal range of motion.  Skin:    General: Skin is warm and dry.     Capillary Refill: Capillary refill takes less than 2 seconds.  Neurological:     Mental Status: She is alert and oriented to person, place, and time.  Psychiatric:        Behavior: Behavior normal.      Musculoskeletal Exam: Patient remained in the wheelchair during the examination today.  C-spine has limited range of motion with lateral rotation.  Thoracic kyphosis noted.  Shoulder joints have good range of motion with some discomfort bilaterally.  Elbow joints and wrist joints have good range of motion with no tenderness or synovitis.  Tenderness and synovitis over the right second and third MCP joints.  PIP and DIP thickening consistent with osteoarthritis of both hands.  Hip joints difficult to assess in seated position.  Both knee joints have good range of motion with no warmth or effusion.  Ankle joints have good range of motion with no tenderness or synovitis.  CDAI Exam: CDAI Score: 5  Patient Global: 5 mm; Provider Global: 5 mm Swollen: 2 ; Tender: 2  Joint Exam 01/10/2023      Right  Left  MCP 2   Swollen Tender     MCP 3  Swollen Tender        Investigation: No additional findings.  Imaging: No results found.  Recent Labs: Lab Results  Component Value Date   WBC 11.9 (H) 10/30/2022   HGB 11.0 (L) 10/30/2022   PLT 246 10/30/2022   NA 143 10/30/2022   K 4.0 10/31/2022   CL 109 10/30/2022   CO2 24 10/30/2022   GLUCOSE 98 10/30/2022   BUN 11 10/30/2022   CREATININE 0.94 10/30/2022   BILITOT 1.1 10/28/2022   ALKPHOS 47 10/28/2022   AST 19 10/28/2022   ALT 14 10/28/2022   PROT 6.3 (L) 10/28/2022   ALBUMIN 2.7 (L) 10/28/2022   CALCIUM 8.6 (L) 10/30/2022   GFRAA 56 (L) 02/03/2021    Speciality Comments: PLQ eye exam:10/22/18 Normal. [facility] Follow up in 6 months. Prior therapy: MTX (recurrent infections) Therapy Contraindications: Anti-TNF's due to CHF  Procedures:  No procedures performed Allergies: Adhesive [tape], Celebrex [celecoxib], Ciprofibrate, Cymbalta [duloxetine hcl], Gabitril [tiagabine], Lyrica [pregabalin], Neurontin [gabapentin], Nexium [esomeprazole], Nsaids, Shrimp [shellfish allergy], Azactam [aztreonam], Azelastine hcl, Ciprofloxacin, Claritin [loratadine], Methotrexate derivatives, Nasacort [triamcinolone], Olopatadine, Other, Sulfamethizole, Zantac [ranitidine hcl], Claritin-d 12 hour [loratadine-pseudoephedrine er], Keflex [cephalexin], Penicillins, and Sulfa antibiotics   Assessment / Plan:     Visit Diagnoses: Rheumatoid arthritis involving multiple sites with positive rheumatoid factor ([place]): Patient was last seen in the office on 02/03/21.  Patient presents today experiencing a flare in the right hand which started about 3 weeks ago.  She has been unable to identify a trigger for her flare.  According to the patient she has been in remission on the maintenance dose of prednisone 10 mg daily since prior to her last office visit.  She was evaluated by Dr. Sabra Heck on 12/27/2022 at which time her dose of prednisone was increased to 40 mg x  2 days, 30 mg x 2 days, 20 mg x 3 days, and then return to her maintenance dose of 10 mg daily.  She tried taking prednisone 40 mg but had insomnia so she reduced the dose of prednisone to 20 mg daily which she has been on since then.  She has noticed remarkable improvement in the swelling in her right hand but continues to have some residual soreness and swelling.  On examination she has tenderness and synovitis over the right second and third MCP joints.  Discussed that her treatment options for management of rheumatoid arthritis are very limited given her past medical history and recurrent infections. She was recently staying at a skilled nursing facility recovering from a UTI leading to sepsis.  I  discussed the patients current flare and treatment dilemma with Dr. Estanislado Pandy today.  She is not a good candidate for DMARDs or Biologics.  In the past she has tried methotrexate, Plaquenil, and Arava.   She was unable to taper prednisone due to adrenal insufficiency.  Recommended completing a prednisone taper taking  20 mg for 1 week, 17.5 mg for 1 week, 15 mg for 1 week, and then returning to 10 mg daily as her maintenance dose.  She was in agreement.  She will notify us if her symptoms persist or worsen at which time she can return for an ultrasound-guided right third MCP joint injection.  She will follow-up in the office as needed.  High risk medication use -A prednisone taper was sent to the pharmacy.  She will then remain on Prednisone 10 mg 1 tablet by mouth daily prescribed by Dr. Sabra Heck- hx of adrenal insufficiency. D/c PLQ-Sept 2020-hair loss. D/c MTX in 01/19-infections.  Leflunomide discontinued  Primary osteoarthritis of both hands: PIP and DIP thicknening.   Primary osteoarthritis of both knees: She has good ROM of both knee joints on examination today.  She has been using a walker to assist with ambulation but remained in the wheelchair during the exam today.   Primary osteoarthritis of both  feet - Mild osteoarthritic changes noted. She is not experiencing any increased pain in her feet at this time.   Age-related osteoporosis without current pathological fracture - Previous DEXA on 10/27/18: The BMD measured at Femur Neck Left is 0.807 g/cm2 with a T-score of -1.7.  DEXA updated on 06/06/21: The BMD measured at Femur Total Left is 0.780 g/cm2 with a T-score of -1.8.   Fibromyalgia - She takes tramadol 50 mg 1 tablet by mouth 4 times daily as needed for pain relief.  DDD (degenerative disc disease), cervical: She has limited ROM of the C-spine with lateral rotation. Thoracic kyphosis noted.   Other medical conditions are listed as follows:   History of fatigue  Adrenal insufficiency ([place]) - She is taking long term prednisone 10 mg by mouth daily.  History of CHF (congestive heart failure)  Orders: No orders of the defined types were placed in this encounter.  Meds ordered this encounter  Medications   predniSONE (DELTASONE) 5 MG tablet    Sig: Take 4 tabs po qd x 7 days, 3.5  tabs po qd x 7 days, 3  tabs po qd x 7 days.    Dispense:  73 tablet    Refill:  0     Follow-Up Instructions: Return if symptoms worsen or fail to improve, for Rheumatoid arthritis, Osteoarthritis.   Ofilia Neas, PA-C  Note - This record has been created using Dragon software.  Chart creation errors have been sought, but may not always  have been located. Such creation errors do not reflect on  the standard of medical care.

## 2023-01-03 ENCOUNTER — Encounter: Payer: Medicare PPO | Admitting: Internal Medicine

## 2023-01-03 ENCOUNTER — Encounter: Payer: Self-pay | Admitting: Internal Medicine

## 2023-01-03 ENCOUNTER — Non-Acute Institutional Stay: Payer: Medicare PPO | Admitting: Internal Medicine

## 2023-01-03 ENCOUNTER — Ambulatory Visit: Payer: Medicare PPO | Admitting: Family

## 2023-01-03 ENCOUNTER — Encounter: Payer: Medicare PPO | Admitting: Family

## 2023-01-03 VITALS — BP 160/92 | HR 104 | Temp 97.8°F | Resp 18 | Ht 62.5 in | Wt 180.0 lb

## 2023-01-03 DIAGNOSIS — R350 Frequency of micturition: Secondary | ICD-10-CM | POA: Diagnosis not present

## 2023-01-03 DIAGNOSIS — N3001 Acute cystitis with hematuria: Secondary | ICD-10-CM | POA: Diagnosis not present

## 2023-01-03 DIAGNOSIS — R300 Dysuria: Secondary | ICD-10-CM | POA: Diagnosis not present

## 2023-01-03 LAB — POCT URINALYSIS DIP (MANUAL ENTRY)
Bilirubin, UA: NEGATIVE
Glucose, UA: NEGATIVE mg/dL
Ketones, POC UA: NEGATIVE mg/dL
Nitrite, UA: NEGATIVE
Protein Ur, POC: NEGATIVE mg/dL
Spec Grav, UA: 1.01 (ref 1.010–1.025)
Urobilinogen, UA: 0.2 E.U./dL
pH, UA: 5 (ref 5.0–8.0)

## 2023-01-03 MED ORDER — CEFADROXIL 500 MG PO CAPS: 500.0000 mg | ORAL_CAPSULE | Freq: Two times a day (BID) | ORAL | 0 refills | Status: AC

## 2023-01-03 NOTE — Patient Instructions (Signed)
Encourage Po Fluids

## 2023-01-03 NOTE — Progress Notes (Signed)
Location: [facility] (12)  Provider:   Code Status:  Goals of Care:     01/03/2023    3:54 PM  Advanced Directives  Does Patient Have a Medical Advance Directive? Yes  Type of Paramedic of [place];Living will;Out of facility DNR (pink MOST or yellow form)  Does patient want to make changes to medical advance directive? No - Patient declined  Copy of [place] in Chart? No - copy requested     Chief Complaint  Patient presents with   Acute Visit    Patient states she has a UTI    HPI: Patient is a 89 y.o. female seen today for an acute visit for Severe Dysuria, Frequency Lower abdominal Discomfort And weakness with Poor Appetite. No Fever or Chills  Has h/o Recurent UTI and Urosepsis Also Has h/o Patient has h/o Rheumatoid Arthritis History of allergic rhinitis,  history of adrenal insufficiency On Prednisone  lower extremity edema, osteoporosis, chronic pain,, fibromyalgia H/o Sigmoid Diverticulitis  Past Medical History:  Diagnosis Date   Adrenal failure ([place])    Arthritis    Asthma    Cancer ([place])    Cataract    Closed nondisplaced fracture of fifth right metatarsal bone 09/18/2017   Diverticulitis    Per patient   E coli bacteremia    Fibromyalgia 2008   GERD (gastroesophageal reflux disease) 12/18/2016   HCAP (healthcare-associated pneumonia) 02/03/2018   Hyperlipidemia 10/13/2021   Osteoporosis    RA (rheumatoid arthritis) (HCC)    Recurrent upper respiratory infection (URI)    Sepsis due to urinary tract infection ([place]) 01/18/2018   Urticaria     Past Surgical History:  Procedure Laterality Date   BACK SURGERY     BILATERAL CARPAL TUNNEL RELEASE  2005   right and left   CATARACT EXTRACTION, BILATERAL  2004   right and left   CERVICAL FUSION  2011,2010,2008   2 disks   HEEL SPUR SURGERY  2004   lower back fusion  2011   Fusion of 3-4 and 4-5 lower back    RADIOFREQUENCY ABLATION  2020   ROTATOR CUFF REPAIR  [id number]   SQUAMOUS CELL CARCINOMA EXCISION     TONSILLECTOMY AND ADENOIDECTOMY  1947   TOTAL SHOULDER ARTHROPLASTY      Allergies  Allergen Reactions   Adhesive [Tape] Rash   Celebrex [Celecoxib] Hives   Ciprofibrate Nausea Only   Cymbalta [Duloxetine Hcl] Swelling   Gabitril [Tiagabine] Swelling   Lyrica [Pregabalin] Swelling   Neurontin [Gabapentin] Swelling   Nexium [Esomeprazole] Rash   Nsaids Rash   Shrimp [Shellfish Allergy] Anaphylaxis    Per patient "shrimp only"   Azactam [Aztreonam]     Hand swelling    Azelastine Hcl     Rash    Ciprofloxacin Other (See Comments)    dizziness   Claritin [Loratadine]     Irritability Nervousness    Methotrexate Derivatives    Nasacort [Triamcinolone]     Dizzy    Olopatadine Other (See Comments)    Pain and lethargy    Other    Sulfamethizole Other (See Comments)    unknown   Zantac [Ranitidine Hcl] Other (See Comments)    unknown   Claritin-D 12 Hour [Loratadine-Pseudoephedrine Er] Anxiety   Keflex [Cephalexin] Nausea And Vomiting    Tolerated Ancef   Penicillins Rash    Injection site reaction. Tolerated cefepime in past. Also reports tolerating  a penicillin infusion after this initial rxn ~20 yrs ago.  Has patient had a PCN reaction causing immediate rash, facial/tongue/throat swelling, SOB or lightheadedness with hypotension: No Has patient had a PCN reaction causing severe rash involving mucus membranes or skin necrosis: No Has patient had a PCN reaction that required hospitalization No Has patient had a PCN reaction occurring within the last 10 years: No     Sulfa Antibiotics Nausea And Vomiting    Outpatient Encounter Medications as of 01/03/2023  Medication Sig   atorvastatin (LIPITOR) 20 MG tablet TAKE 1 TABLET BY MOUTH EVERY DAY   Biotin 5 MG TBDP Take 1 tablet (5 mg total) by mouth daily.   cefadroxil (DURICEF) 500 MG capsule Take 1 capsule (500 mg  total) by mouth 2 (two) times daily for 7 days.   Cholecalciferol (VITAMIN D3) 125 MCG (5000 UT) CAPS TAKE 1 CAPSULE BY MOUTH EVERY DAY   CRANBERRY PO Take 1 capsule by mouth daily.   cycloSPORINE (RESTASIS) 0.05 % ophthalmic emulsion Place 1 drop into both eyes 2 (two) times daily.   diclofenac Sodium (VOLTAREN) 1 % GEL APPLY 2 TO 4 GRAMS TOPICALLY TO AFFECTED JOINTS UP TO 4 TIMES DAILY (Patient taking differently: Apply 2-4 g topically 4 (four) times daily as needed (pain). APPLY 2 TO 4 GRAMS TOPICALLY TO AFFECTED JOINTS UP TO 4 TIMES DAILY)   fexofenadine (ALLEGRA) 180 MG tablet Take 1 tablet (180 mg total) by mouth daily.   lansoprazole (PREVACID) 30 MG capsule TAKE 1 CAPSULE BY MOUTH ONCE DAILY AT NOON (Patient taking differently: Take 30 mg by mouth daily at 12 noon.)   Melatonin 10 MG TABS Take 10 mg by mouth at bedtime.   montelukast (SINGULAIR) 10 MG tablet TAKE 1 TABLET BY MOUTH EVERYDAY AT BEDTIME (Patient taking differently: Take 10 mg by mouth at bedtime.)   potassium chloride (MICRO-K) 10 MEQ CR capsule Take 2 capsules (20 mEq total) by mouth 2 (two) times daily.   predniSONE (DELTASONE) 10 MG tablet TAKE 1 TABLET (10 MG TOTAL) BY MOUTH DAILY WITH BREAKFAST.   predniSONE (DELTASONE) 10 MG tablet Take 1 tablet (10 mg total) by mouth daily with breakfast. Take 4 tab x 2 days, 3 tab x 2 days 2 tab x 2 days then continue with 1 tab/day   traMADol (ULTRAM) 50 MG tablet TAKE 1 TABLET BY MOUTH FOUR TIMES A DAY   traMADol HCl 100 MG TABS Take 1 tablet by mouth once daily   vitamin B-12 (CYANOCOBALAMIN) 1000 MCG tablet Take 1 tablet (1,000 mcg total) by mouth daily.   colchicine 0.6 MG tablet TAKE 1 TABLET BY MOUTH EVERY DAY (Patient not taking: Reported on 01/03/2023)   furosemide (LASIX) 80 MG tablet Take 1 tablet (80 mg total) by mouth daily.   No facility-administered encounter medications on file as of 01/03/2023.    Review of Systems:  Review of Systems  Constitutional:  Positive for  activity change and appetite change.  HENT: Negative.    Respiratory:  Negative for cough and shortness of breath.   Cardiovascular:  Negative for leg swelling.  Gastrointestinal:  Negative for constipation.  Genitourinary:  Positive for frequency, hematuria, pelvic pain and urgency.  Musculoskeletal:  Positive for gait problem. Negative for arthralgias and myalgias.  Skin: Negative.   Neurological:  Positive for weakness. Negative for dizziness.  Psychiatric/Behavioral:  Negative for confusion, dysphoric mood and sleep disturbance.     Health Maintenance  Topic Date Due   DTaP/Tdap/Td (1 -  Tdap) Never done   COVID-19 Vaccine (5 - 2023-24 season) 08/11/2022   Medicare Annual Wellness (AWV)  01/19/2023   Pneumonia Vaccine 88+ Years old  Completed   INFLUENZA VACCINE  Completed   DEXA SCAN  Completed   Zoster Vaccines- Shingrix  Completed   HPV VACCINES  Aged Out    Physical Exam: Vitals:   01/03/23 1550  BP: (!) 160/92  Pulse: (!) 104  Resp: 18  Temp: 97.8 F (36.6 C)  TempSrc: Temporal  SpO2: 95%  Weight: 180 lb (81.6 kg)  Height: 5' 2.5" (1.588 m)   Body mass index is 32.4 kg/m. Physical Exam Vitals reviewed.  Constitutional:      Appearance: Normal appearance.  HENT:     Head: Normocephalic.     Nose: Nose normal.     Mouth/Throat:     Mouth: Mucous membranes are moist.     Pharynx: Oropharynx is clear.  Eyes:     Pupils: Pupils are equal, round, and reactive to light.  Cardiovascular:     Rate and Rhythm: Regular rhythm. Tachycardia present.     Pulses: Normal pulses.     Heart sounds: Normal heart sounds. No murmur heard. Pulmonary:     Effort: Pulmonary effort is normal.     Breath sounds: Normal breath sounds.  Abdominal:     General: Abdomen is flat. Bowel sounds are normal.     Palpations: Abdomen is soft.  Musculoskeletal:        General: No swelling.     Cervical back: Neck supple.  Skin:    General: Skin is warm.  Neurological:      General: No focal deficit present.     Mental Status: She is alert and oriented to person, place, and time.  Psychiatric:        Mood and Affect: Mood normal.        Thought Content: Thought content normal.     Labs reviewed: Basic Metabolic Panel: Recent Labs    02/27/22 1635 05/21/22 1205 05/28/22 0551 05/29/22 0419 05/30/22 0514 10/27/22 0945 10/28/22 0357 10/29/22 0419 10/30/22 1216 10/31/22 0415  NA  --    < > 140 140 143   < > 143 144 143  --   K  --    < > 3.3* 4.1 3.9   < > 3.3* 2.9* 3.1* 4.0  CL  --    < > 99 102 104   < > 109 111 109  --   CO2  --    < > '30 29 30   '$ < > '27 24 24  '$ --   GLUCOSE  --    < > 114* 178* 157*   < > 152* 103* 98  --   BUN  --    < > 9 20 24*   < > '10 13 11  '$ --   CREATININE  --    < > 0.83 0.85 0.87   < > 1.05* 0.84 0.94  --   CALCIUM  --    < > 9.5 9.5 9.1   < > 8.9 8.8* 8.6*  --   MG  --    < > 2.0 2.3 2.2  --   --   --   --   --   TSH 0.865  --   --   --   --   --   --   --   --   --    < > =  values in this interval not displayed.   Liver Function Tests: Recent Labs    05/27/22 0543 10/27/22 0945 10/28/22 0357  AST 14* 25 19  ALT '10 17 14  '$ ALKPHOS 49 61 47  BILITOT 0.8 1.4* 1.1  PROT 6.1* 7.6 6.3*  ALBUMIN 2.9* 3.7 2.7*   Recent Labs    05/21/22 1205  LIPASE 22   No results for input(s): "AMMONIA" in the last 8760 hours. CBC: Recent Labs    10/27/22 0945 10/28/22 0357 10/29/22 0343 10/30/22 1216  WBC 18.6* 17.1* 13.6* 11.9*  NEUTROABS 12.7* 14.6* 10.1*  --   HGB 13.0 10.9* 9.8* 11.0*  HCT 41.9 35.8* 32.8* 37.5  MCV 97.2 98.6 99.7 103.0*  PLT 280 240 221 246   Lipid Panel: No results for input(s): "CHOL", "HDL", "LDLCALC", "TRIG", "CHOLHDL", "LDLDIRECT" in the last 8760 hours. No results found for: "HGBA1C"  Procedures since last visit: No results found.  Assessment/Plan 1. Frequent urination  - POCT urinalysis dipstick - Culture, Urine  2. Burning with urination  - POCT urinalysis dipstick -  Culture, Urine  3. Acute cystitis with hematuria Moderate Leucocytes and Moderate Blood Nitrites Negative  Send Urine for Culture Patient responded to Cefadroxil  in the past  Will start on one tablet BID for 7 days Follow with [place] in 3 weeks to make sure her symptoms are better She was little Tachycardic  Encourage PO Fluids  Labs/tests ordered:  * No order type specified * Next appt:  Visit date not found

## 2023-01-04 DIAGNOSIS — R300 Dysuria: Secondary | ICD-10-CM | POA: Diagnosis not present

## 2023-01-04 DIAGNOSIS — R350 Frequency of micturition: Secondary | ICD-10-CM | POA: Diagnosis not present

## 2023-01-05 ENCOUNTER — Telehealth: Payer: Self-pay

## 2023-01-05 ENCOUNTER — Other Ambulatory Visit: Payer: Self-pay | Admitting: Nurse Practitioner

## 2023-01-05 ENCOUNTER — Other Ambulatory Visit: Payer: Self-pay

## 2023-01-05 DIAGNOSIS — R300 Dysuria: Secondary | ICD-10-CM

## 2023-01-05 MED ORDER — CIPROFLOXACIN HCL 500 MG PO TABS: 500.0000 mg | ORAL_TABLET | Freq: Two times a day (BID) | ORAL | 0 refills | Status: AC

## 2023-01-05 NOTE — Telephone Encounter (Signed)
Mast, [name], NP  Carroll Kinds, CMA Please call Eileen Wilkerson daughter: Dr. Lyndel Safe advised to continue Cefadroxil since the patient is allergic to Cipro, ED eval if dizziness persists. Thank you  I called and spoke with patient's daughter. I informed her of the above response also to increase fluid intake per Dr. Lyndel Safe. She verbalized her understanding and agreed.

## 2023-01-05 NOTE — Telephone Encounter (Signed)
Patient's daughter called and left message on clinical intake voicemail stating that patient was given antibiotics two days ago and now she is having dizziness. She would like to know if a different antibiotic needs to be called in or if to just stop taking antibiotic.  Message sent to [facility] Mast, NP

## 2023-01-06 DIAGNOSIS — M545 Low back pain, unspecified: Secondary | ICD-10-CM | POA: Diagnosis not present

## 2023-01-06 DIAGNOSIS — M1380 Other specified arthritis, unspecified site: Secondary | ICD-10-CM | POA: Diagnosis not present

## 2023-01-06 DIAGNOSIS — M069 Rheumatoid arthritis, unspecified: Secondary | ICD-10-CM | POA: Diagnosis not present

## 2023-01-06 DIAGNOSIS — E2749 Other adrenocortical insufficiency: Secondary | ICD-10-CM | POA: Diagnosis not present

## 2023-01-06 DIAGNOSIS — M810 Age-related osteoporosis without current pathological fracture: Secondary | ICD-10-CM | POA: Diagnosis not present

## 2023-01-06 DIAGNOSIS — R0602 Shortness of breath: Secondary | ICD-10-CM | POA: Diagnosis not present

## 2023-01-06 LAB — URINALYSIS, ROUTINE W REFLEX MICROSCOPIC
Bacteria, UA: NONE SEEN /HPF
Bilirubin Urine: NEGATIVE
Glucose, UA: NEGATIVE
Hyaline Cast: NONE SEEN /LPF
Ketones, ur: NEGATIVE
Nitrite: NEGATIVE
Protein, ur: NEGATIVE
Specific Gravity, Urine: 1.01 (ref 1.001–1.035)
Squamous Epithelial / HPF: NONE SEEN /HPF (ref <=5)
pH: 6 (ref 5.0–8.0)

## 2023-01-06 LAB — URINE CULTURE
MICRO NUMBER:: 14478899
Result:: NO GROWTH
SPECIMEN QUALITY:: ADEQUATE

## 2023-01-06 LAB — TEST AUTHORIZATION

## 2023-01-06 LAB — EXTRA URINE SPECIMEN

## 2023-01-10 ENCOUNTER — Ambulatory Visit: Payer: Medicare PPO | Attending: Physician Assistant | Admitting: Physician Assistant

## 2023-01-10 ENCOUNTER — Other Ambulatory Visit: Payer: Self-pay | Admitting: Nurse Practitioner

## 2023-01-10 ENCOUNTER — Encounter: Payer: Self-pay | Admitting: Physician Assistant

## 2023-01-10 VITALS — BP 145/84 | HR 72 | Resp 18 | Ht 62.5 in | Wt 181.0 lb

## 2023-01-10 DIAGNOSIS — M19071 Primary osteoarthritis, right ankle and foot: Secondary | ICD-10-CM

## 2023-01-10 DIAGNOSIS — M19042 Primary osteoarthritis, left hand: Secondary | ICD-10-CM

## 2023-01-10 DIAGNOSIS — M79605 Pain in left leg: Secondary | ICD-10-CM

## 2023-01-10 DIAGNOSIS — M19041 Primary osteoarthritis, right hand: Secondary | ICD-10-CM

## 2023-01-10 DIAGNOSIS — Z79899 Other long term (current) drug therapy: Secondary | ICD-10-CM

## 2023-01-10 DIAGNOSIS — M797 Fibromyalgia: Secondary | ICD-10-CM | POA: Diagnosis not present

## 2023-01-10 DIAGNOSIS — M810 Age-related osteoporosis without current pathological fracture: Secondary | ICD-10-CM

## 2023-01-10 DIAGNOSIS — Z8679 Personal history of other diseases of the circulatory system: Secondary | ICD-10-CM

## 2023-01-10 DIAGNOSIS — M5030 Other cervical disc degeneration, unspecified cervical region: Secondary | ICD-10-CM | POA: Diagnosis not present

## 2023-01-10 DIAGNOSIS — E2740 Unspecified adrenocortical insufficiency: Secondary | ICD-10-CM

## 2023-01-10 DIAGNOSIS — M170 Bilateral primary osteoarthritis of knee: Secondary | ICD-10-CM | POA: Diagnosis not present

## 2023-01-10 DIAGNOSIS — Z87898 Personal history of other specified conditions: Secondary | ICD-10-CM

## 2023-01-10 DIAGNOSIS — M0579 Rheumatoid arthritis with rheumatoid factor of multiple sites without organ or systems involvement: Secondary | ICD-10-CM | POA: Diagnosis not present

## 2023-01-10 DIAGNOSIS — M19072 Primary osteoarthritis, left ankle and foot: Secondary | ICD-10-CM

## 2023-01-10 MED ORDER — PREDNISONE 5 MG PO TABS: ORAL_TABLET | ORAL | 0 refills | Status: DC

## 2023-01-10 NOTE — Telephone Encounter (Signed)
Patient is requesting a refill of the following medications: Requested Prescriptions   Pending Prescriptions Disp Refills   traMADol HCl 100 MG TABS [Pharmacy Med Name: traMADol HCl 100 MG Oral Tablet] 15 tablet 0    Sig: Take 1 tablet by mouth once daily    Date of last refill: 11/27/2022  Refill amount: 30  Treatment agreement date: not on file, notation made on pending appointment for 01/25/23

## 2023-01-12 ENCOUNTER — Other Ambulatory Visit: Payer: Self-pay | Admitting: Nurse Practitioner

## 2023-01-12 DIAGNOSIS — M0579 Rheumatoid arthritis with rheumatoid factor of multiple sites without organ or systems involvement: Secondary | ICD-10-CM

## 2023-01-12 NOTE — Telephone Encounter (Signed)
Patient is taking Tramadol '100mg'$  but request '50mg'$ . Please Advise due to '100mg'$  just recently being filled.

## 2023-01-22 DIAGNOSIS — M48062 Spinal stenosis, lumbar region with neurogenic claudication: Secondary | ICD-10-CM | POA: Diagnosis not present

## 2023-01-25 ENCOUNTER — Encounter: Payer: Self-pay | Admitting: Nurse Practitioner

## 2023-01-25 ENCOUNTER — Encounter: Payer: Medicare PPO | Admitting: Nurse Practitioner

## 2023-01-25 DIAGNOSIS — M48062 Spinal stenosis, lumbar region with neurogenic claudication: Secondary | ICD-10-CM | POA: Diagnosis not present

## 2023-01-25 NOTE — Assessment & Plan Note (Signed)
Gout, R ankle, stable, takes Colchicine

## 2023-01-25 NOTE — Assessment & Plan Note (Signed)
takes Atorvastatin  

## 2023-01-25 NOTE — Assessment & Plan Note (Signed)
Vit B12 deficiency, on Vit B12 po, Vit B12 235 06/20/21

## 2023-01-25 NOTE — Assessment & Plan Note (Signed)
RA/fibromyalgia,  takes Prednisone, Tramadol, TSH 0.865 02/27/22

## 2023-01-25 NOTE — Progress Notes (Signed)
This encounter was created in error - please disregard.

## 2023-01-25 NOTE — Assessment & Plan Note (Signed)
Chronic diastolic heart failure, DOE, on and off wheezing, cough, occasional yellow phlegm in am. CXR no evidence of acute infection 10/2022. EF 60-65% 05/22/22

## 2023-01-25 NOTE — Assessment & Plan Note (Signed)
Asthma Epinephrine prn, Allegra, Flonase, Singulair

## 2023-01-25 NOTE — Assessment & Plan Note (Signed)
Hx of sinus tachycardia, heart rate is in control.

## 2023-01-25 NOTE — Assessment & Plan Note (Signed)
Venous insufficiency/Edema , takes Furosemide, Kcl. Bun/creat 11/0.94 10/30/22, K 4.0 10/31/22

## 2023-01-25 NOTE — Assessment & Plan Note (Signed)
OP Rheumatology, off Alendronate, on Vit D, t score -1.8 06/06/21

## 2023-01-29 ENCOUNTER — Other Ambulatory Visit: Payer: Self-pay | Admitting: Physician Assistant

## 2023-01-29 ENCOUNTER — Encounter: Payer: Medicare PPO | Admitting: Orthopedic Surgery

## 2023-01-30 ENCOUNTER — Ambulatory Visit: Payer: Medicare PPO | Admitting: Adult Health

## 2023-01-30 NOTE — Progress Notes (Signed)
This encounter was created in error - please disregard.

## 2023-01-31 ENCOUNTER — Non-Acute Institutional Stay: Payer: Medicare PPO | Admitting: Internal Medicine

## 2023-01-31 ENCOUNTER — Encounter: Payer: Self-pay | Admitting: Internal Medicine

## 2023-01-31 VITALS — BP 152/88 | HR 83 | Temp 97.6°F | Resp 17 | Ht 62.5 in | Wt 180.0 lb

## 2023-01-31 DIAGNOSIS — R350 Frequency of micturition: Secondary | ICD-10-CM

## 2023-01-31 DIAGNOSIS — R300 Dysuria: Secondary | ICD-10-CM | POA: Diagnosis not present

## 2023-01-31 DIAGNOSIS — B3731 Acute candidiasis of vulva and vagina: Secondary | ICD-10-CM

## 2023-01-31 LAB — POCT URINALYSIS DIP (MANUAL ENTRY)
Bilirubin, UA: NEGATIVE
Glucose, UA: NEGATIVE mg/dL
Ketones, POC UA: NEGATIVE mg/dL
Nitrite, UA: NEGATIVE
Protein Ur, POC: 30 mg/dL — AB
Spec Grav, UA: 1.015 (ref 1.010–1.025)
Urobilinogen, UA: 0.2 E.U./dL
pH, UA: 5 (ref 5.0–8.0)

## 2023-01-31 MED ORDER — FLUCONAZOLE 100 MG PO TABS: 100.0000 mg | ORAL_TABLET | Freq: Every day | ORAL | 0 refills | Status: DC

## 2023-01-31 NOTE — Progress Notes (Signed)
Location: [facility] (12)  Provider:   Code Status:  Goals of Care:     01/31/2023    1:09 PM  Advanced Directives  Does Patient Have a Medical Advance Directive? Yes  Type of Paramedic of [place];Living will;Out of facility DNR (pink MOST or yellow form)  Does patient want to make changes to medical advance directive? No - Patient declined  Copy of [place] in Chart? No - copy requested     Chief Complaint  Patient presents with   Medical Management of Chronic Issues    Follow up on UTI. Patient states she would like to be rechecked    HPI: Patient is a 89 y.o. female seen today for an acute visit for continues ot have Dysuria Want to make sure she does not have UTI  Lives with Caregiver in [facility]  Diagnosed with UTI in 01/03/23 Eventual Growth Negative for any bacteria She says her symptoms got better but now they have come back She also is on Higher dose of Prednisone due to Flair of Rheumatoid arthritis  No Fever or chills No Frequency Just Dysuria now No Nausea or vomiting  Other issues Has h/o Recurent UTI and Urosepsis Also Has h/o Patient has h/o Rheumatoid Arthritis History of allergic rhinitis,  history of adrenal insufficiency On Prednisone  lower extremity edema, osteoporosis, chronic pain,, fibromyalgia H/o Sigmoid Diverticulitis     Past Medical History:  Diagnosis Date   Adrenal failure ([place])    Arthritis    Asthma    Cancer ([place])    Cataract    Closed nondisplaced fracture of fifth right metatarsal bone 09/18/2017   Diverticulitis    Per patient   E coli bacteremia    Fibromyalgia 2008   GERD (gastroesophageal reflux disease) 12/18/2016   HCAP (healthcare-associated pneumonia) 02/03/2018   Hyperlipidemia 10/13/2021   Osteoporosis    RA (rheumatoid arthritis) ([place])    Recurrent upper respiratory infection (URI)    Sepsis due to urinary tract infection ([place])  01/18/2018   Urticaria     Past Surgical History:  Procedure Laterality Date   BACK SURGERY     BILATERAL CARPAL TUNNEL RELEASE  2005   right and left   CATARACT EXTRACTION, BILATERAL  2004   right and left   CERVICAL FUSION  2011,2010,2008   2 disks   HEEL SPUR SURGERY  2004   lower back fusion  2011   Fusion of 3-4 and 4-5 lower back   RADIOFREQUENCY ABLATION  2020   ROTATOR CUFF REPAIR  [id number]   SQUAMOUS CELL CARCINOMA EXCISION     TONSILLECTOMY AND ADENOIDECTOMY  1947   TOTAL SHOULDER ARTHROPLASTY      Allergies  Allergen Reactions   Adhesive [Tape] Rash   Celebrex [Celecoxib] Hives   Ciprofibrate Nausea Only   Cymbalta [Duloxetine Hcl] Swelling   Gabitril [Tiagabine] Swelling   Lyrica [Pregabalin] Swelling   Neurontin [Gabapentin] Swelling   Nexium [Esomeprazole] Rash   Nsaids Rash   Shrimp [Shellfish Allergy] Anaphylaxis    Per patient "shrimp only"   Azactam [Aztreonam]     Hand swelling    Azelastine Hcl     Rash    Ciprofloxacin Other (See Comments)    dizziness   Claritin [Loratadine]     Irritability Nervousness    Methotrexate Derivatives    Nasacort [Triamcinolone]     Dizzy    Olopatadine Other (See Comments)  Pain and lethargy    Other    Sulfamethizole Other (See Comments)    unknown   Zantac [Ranitidine Hcl] Other (See Comments)    unknown   Claritin-D 12 Hour [Loratadine-Pseudoephedrine Er] Anxiety   Keflex [Cephalexin] Nausea And Vomiting    Tolerated Ancef   Penicillins Rash    Injection site reaction. Tolerated cefepime in past. Also reports tolerating a penicillin infusion after this initial rxn ~20 yrs ago.  Has patient had a PCN reaction causing immediate rash, facial/tongue/throat swelling, SOB or lightheadedness with hypotension: No Has patient had a PCN reaction causing severe rash involving mucus membranes or skin necrosis: No Has patient had a PCN reaction that required hospitalization No Has patient had a PCN reaction  occurring within the last 10 years: No     Sulfa Antibiotics Nausea And Vomiting    Outpatient Encounter Medications as of 01/31/2023  Medication Sig   atorvastatin (LIPITOR) 20 MG tablet TAKE 1 TABLET BY MOUTH EVERY DAY   Biotin 5 MG TBDP Take 1 tablet (5 mg total) by mouth daily.   Cholecalciferol (VITAMIN D3) 125 MCG (5000 UT) CAPS TAKE 1 CAPSULE BY MOUTH EVERY DAY   colchicine 0.6 MG tablet TAKE 1 TABLET BY MOUTH EVERY DAY   CRANBERRY PO Take 1 capsule by mouth daily.   cycloSPORINE (RESTASIS) 0.05 % ophthalmic emulsion Place 1 drop into both eyes 2 (two) times daily.   diclofenac Sodium (VOLTAREN) 1 % GEL APPLY 2 TO 4 GRAMS TOPICALLY TO AFFECTED JOINTS UP TO 4 TIMES DAILY (Patient taking differently: Apply 2-4 g topically 4 (four) times daily as needed (pain). APPLY 2 TO 4 GRAMS TOPICALLY TO AFFECTED JOINTS UP TO 4 TIMES DAILY)   fexofenadine (ALLEGRA) 180 MG tablet Take 1 tablet (180 mg total) by mouth daily.   fluconazole (DIFLUCAN) 100 MG tablet Take 1 tablet (100 mg total) by mouth daily.   furosemide (LASIX) 80 MG tablet Take 1 tablet (80 mg total) by mouth daily.   lansoprazole (PREVACID) 30 MG capsule TAKE 1 CAPSULE BY MOUTH ONCE DAILY AT NOON (Patient taking differently: Take 30 mg by mouth daily at 12 noon.)   Melatonin 10 MG TABS Take 10 mg by mouth at bedtime.   montelukast (SINGULAIR) 10 MG tablet TAKE 1 TABLET BY MOUTH EVERYDAY AT BEDTIME (Patient taking differently: Take 10 mg by mouth at bedtime.)   potassium chloride (MICRO-K) 10 MEQ CR capsule Take 2 capsules (20 mEq total) by mouth 2 (two) times daily.   predniSONE (DELTASONE) 10 MG tablet TAKE 1 TABLET (10 MG TOTAL) BY MOUTH DAILY WITH BREAKFAST.   predniSONE (DELTASONE) 5 MG tablet Take 4 tabs po qd x 7 days, 3.5  tabs po qd x 7 days, 3  tabs po qd x 7 days.   traMADol (ULTRAM) 50 MG tablet TAKE 1 TABLET BY MOUTH FOUR TIMES A DAY   traMADol HCl 100 MG TABS Take 1 tablet by mouth once daily   vitamin B-12  (CYANOCOBALAMIN) 1000 MCG tablet Take 1 tablet (1,000 mcg total) by mouth daily.   predniSONE (DELTASONE) 10 MG tablet Take 1 tablet (10 mg total) by mouth daily with breakfast. Take 4 tab x 2 days, 3 tab x 2 days 2 tab x 2 days then continue with 1 tab/day (Patient not taking: Reported on 01/31/2023)   No facility-administered encounter medications on file as of 01/31/2023.    Review of Systems:  Review of Systems  Constitutional:  Negative for activity change and appetite  change.  HENT: Negative.    Respiratory:  Negative for cough and shortness of breath.   Cardiovascular:  Negative for leg swelling.  Gastrointestinal:  Negative for constipation.  Genitourinary:  Positive for dysuria and urgency.  Musculoskeletal:  Positive for gait problem. Negative for arthralgias and myalgias.  Skin: Negative.   Neurological:  Positive for weakness. Negative for dizziness.  Psychiatric/Behavioral:  Positive for confusion. Negative for dysphoric mood and sleep disturbance.     Health Maintenance  Topic Date Due   DTaP/Tdap/Td (1 - Tdap) Never done   Medicare Annual Wellness (AWV)  01/19/2023   COVID-19 Vaccine (5 - 2023-24 season) 03/11/2024 (Originally 08/11/2022)   Pneumonia Vaccine 23+ Years old  Completed   INFLUENZA VACCINE  Completed   DEXA SCAN  Completed   Zoster Vaccines- Shingrix  Completed   HPV VACCINES  Aged Out    Physical Exam: Vitals:   01/31/23 1306 01/31/23 1311  BP: (!) 158/88 (!) 152/88  Pulse: 92 83  Resp: 17   Temp: 97.6 F (36.4 C)   TempSrc: Temporal   SpO2: 92%   Weight: 180 lb (81.6 kg)   Height: 5' 2.5" (1.588 m)    Body mass index is 32.4 kg/m. Physical Exam Vitals reviewed.  Constitutional:      Appearance: Normal appearance.  HENT:     Head: Normocephalic.     Nose: Nose normal.     Mouth/Throat:     Mouth: Mucous membranes are moist.     Pharynx: Oropharynx is clear.  Eyes:     Pupils: Pupils are equal, round, and reactive to light.   Cardiovascular:     Rate and Rhythm: Normal rate and regular rhythm.     Pulses: Normal pulses.     Heart sounds: Normal heart sounds. No murmur heard. Pulmonary:     Effort: Pulmonary effort is normal.     Breath sounds: Normal breath sounds.  Abdominal:     General: Abdomen is flat. Bowel sounds are normal.     Palpations: Abdomen is soft.  Genitourinary:    Comments: Vaginal Exam Showed Excoriated skin in Inner Labia with Pain due to Candidiasis Musculoskeletal:        General: No swelling.     Cervical back: Neck supple.  Skin:    General: Skin is warm.  Neurological:     General: No focal deficit present.     Mental Status: She is alert and oriented to person, place, and time.  Psychiatric:        Mood and Affect: Mood normal.        Thought Content: Thought content normal.     Labs reviewed: Basic Metabolic Panel: Recent Labs    02/27/22 1635 05/21/22 1205 05/28/22 0551 05/29/22 0419 05/30/22 0514 10/27/22 0945 10/28/22 0357 10/29/22 0419 10/30/22 1216 10/31/22 0415  NA  --    < > 140 140 143   < > 143 144 143  --   K  --    < > 3.3* 4.1 3.9   < > 3.3* 2.9* 3.1* 4.0  CL  --    < > 99 102 104   < > 109 111 109  --   CO2  --    < > 30 29 30   $ < > 27 24 24  $ --   GLUCOSE  --    < > 114* 178* 157*   < > 152* 103* 98  --   BUN  --    < >  9 20 24*   < > 10 13 11  $ --   CREATININE  --    < > 0.83 0.85 0.87   < > 1.05* 0.84 0.94  --   CALCIUM  --    < > 9.5 9.5 9.1   < > 8.9 8.8* 8.6*  --   MG  --    < > 2.0 2.3 2.2  --   --   --   --   --   TSH 0.865  --   --   --   --   --   --   --   --   --    < > = values in this interval not displayed.   Liver Function Tests: Recent Labs    05/27/22 0543 10/27/22 0945 10/28/22 0357  AST 14* 25 19  ALT 10 17 14  $ ALKPHOS 49 61 47  BILITOT 0.8 1.4* 1.1  PROT 6.1* 7.6 6.3*  ALBUMIN 2.9* 3.7 2.7*   Recent Labs    05/21/22 1205  LIPASE 22   No results for input(s): "AMMONIA" in the last 8760 hours. CBC: Recent  Labs    10/27/22 0945 10/28/22 0357 10/29/22 0343 10/30/22 1216  WBC 18.6* 17.1* 13.6* 11.9*  NEUTROABS 12.7* 14.6* 10.1*  --   HGB 13.0 10.9* 9.8* 11.0*  HCT 41.9 35.8* 32.8* 37.5  MCV 97.2 98.6 99.7 103.0*  PLT 280 240 221 246   Lipid Panel: No results for input(s): "CHOL", "HDL", "LDLCALC", "TRIG", "CHOLHDL", "LDLDIRECT" in the last 8760 hours. No results found for: "HGBA1C"  Procedures since last visit: No results found.  Assessment/Plan 1. Frequent urination Urine sample was not enough Dipstick Showed Leucocyte and Blood Once her Yeast infection is treated will get sample again for Culture  Will repeat Following in 2 weeks - POCT urinalysis dipstick - Urinalysis - Culture, Urine  2. Dysuria Most Likely due to Fungal rash Repeat UA and Culture in 2 weeks - Urinalysis - Culture, Urine  3. Vaginal candidiasis Diflucan 100 mg QD for 5 days Use OTC Nystatin To help With Symptoms 4 Rheumatoid Flare up On Prednisone Taper   Labs/tests ordered:  * No order type specified * Next appt:  02/15/2023

## 2023-01-31 NOTE — Patient Instructions (Signed)
Use Over the Counter Miconazole Cream Apply in the inner Lips of Labia  after Peeing

## 2023-02-06 DIAGNOSIS — M069 Rheumatoid arthritis, unspecified: Secondary | ICD-10-CM | POA: Diagnosis not present

## 2023-02-06 DIAGNOSIS — M545 Low back pain, unspecified: Secondary | ICD-10-CM | POA: Diagnosis not present

## 2023-02-06 DIAGNOSIS — R0602 Shortness of breath: Secondary | ICD-10-CM | POA: Diagnosis not present

## 2023-02-06 DIAGNOSIS — E2749 Other adrenocortical insufficiency: Secondary | ICD-10-CM | POA: Diagnosis not present

## 2023-02-06 DIAGNOSIS — M1380 Other specified arthritis, unspecified site: Secondary | ICD-10-CM | POA: Diagnosis not present

## 2023-02-06 DIAGNOSIS — M810 Age-related osteoporosis without current pathological fracture: Secondary | ICD-10-CM | POA: Diagnosis not present

## 2023-02-08 ENCOUNTER — Non-Acute Institutional Stay: Payer: Medicare PPO | Admitting: Nurse Practitioner

## 2023-02-08 ENCOUNTER — Encounter: Payer: Self-pay | Admitting: Nurse Practitioner

## 2023-02-08 VITALS — BP 122/90 | HR 97 | Temp 97.3°F | Resp 18 | Ht 62.5 in | Wt 180.0 lb

## 2023-02-08 DIAGNOSIS — K219 Gastro-esophageal reflux disease without esophagitis: Secondary | ICD-10-CM

## 2023-02-08 DIAGNOSIS — K5720 Diverticulitis of large intestine with perforation and abscess without bleeding: Secondary | ICD-10-CM | POA: Diagnosis not present

## 2023-02-08 DIAGNOSIS — R000 Tachycardia, unspecified: Secondary | ICD-10-CM | POA: Diagnosis not present

## 2023-02-08 DIAGNOSIS — J45909 Unspecified asthma, uncomplicated: Secondary | ICD-10-CM

## 2023-02-08 DIAGNOSIS — R350 Frequency of micturition: Secondary | ICD-10-CM | POA: Diagnosis not present

## 2023-02-08 DIAGNOSIS — M797 Fibromyalgia: Secondary | ICD-10-CM | POA: Diagnosis not present

## 2023-02-08 DIAGNOSIS — I5032 Chronic diastolic (congestive) heart failure: Secondary | ICD-10-CM | POA: Diagnosis not present

## 2023-02-08 DIAGNOSIS — M25552 Pain in left hip: Secondary | ICD-10-CM

## 2023-02-08 DIAGNOSIS — M0579 Rheumatoid arthritis with rheumatoid factor of multiple sites without organ or systems involvement: Secondary | ICD-10-CM

## 2023-02-08 DIAGNOSIS — I872 Venous insufficiency (chronic) (peripheral): Secondary | ICD-10-CM

## 2023-02-08 DIAGNOSIS — M810 Age-related osteoporosis without current pathological fracture: Secondary | ICD-10-CM | POA: Diagnosis not present

## 2023-02-08 DIAGNOSIS — E782 Mixed hyperlipidemia: Secondary | ICD-10-CM

## 2023-02-08 DIAGNOSIS — E538 Deficiency of other specified B group vitamins: Secondary | ICD-10-CM

## 2023-02-08 MED ORDER — TRAMADOL HCL 100 MG PO TABS: 100.0000 mg | ORAL_TABLET | Freq: Four times a day (QID) | ORAL | 1 refills | Status: DC | PRN

## 2023-02-08 NOTE — Assessment & Plan Note (Signed)
Chronic diastolic heart failure, DOE, on and off wheezing, cough, occasional yellow phlegm in am. CXR no evidence of acute infection 10/2022. EF 60-65% 05/22/22

## 2023-02-08 NOTE — Assessment & Plan Note (Signed)
C/o left groin/hip pain with movement for 2-3 days, denied fall or injury, warm compress and rest helps. Tramadol '100mg'$  q6hr prn the patient stated it worked, but reduced to '100mg'$  am, '50mg'$  q6hr prn is not adequate. Denied nausea, vomiting, last BM yesterday, no urinary symptoms. Afebrile. May X-ray pelvis, hips if no better.

## 2023-02-08 NOTE — Assessment & Plan Note (Signed)
01/31/23 saw Dr. Lyndel Safe for frequent urination, UA C/S 2 weeks since 01/31/23  Vaginal candidiasis on 5 day course of Diflucan since 01/31/33, prn Nystatin.

## 2023-02-08 NOTE — Assessment & Plan Note (Signed)
R ankle, stable, takes Colchicine

## 2023-02-08 NOTE — Assessment & Plan Note (Signed)
Hx of sinus tachycardia, heart rate is in control.

## 2023-02-08 NOTE — Assessment & Plan Note (Signed)
OP Rheumatology, off Alendronate, on Vit D, t score -1.8 06/06/21

## 2023-02-08 NOTE — Assessment & Plan Note (Addendum)
RA/fibromyalgia, flared up, down to maintenance does of Prednisone, Tramadol, TSH 0.865 02/27/22

## 2023-02-08 NOTE — Assessment & Plan Note (Signed)
The right middle finger stiffness, difficulty eating.

## 2023-02-08 NOTE — Assessment & Plan Note (Signed)
Venous insufficiency/Edema , takes Furosemide, Kcl. Bun/creat 11/0.94 10/30/22, K 4.0 10/31/22

## 2023-02-08 NOTE — Addendum Note (Signed)
Addended by: Dorothea Ogle X on: 02/08/2023 03:34 PM   Modules accepted: Orders

## 2023-02-08 NOTE — Assessment & Plan Note (Signed)
on Vit B12 po, Vit B12 235 06/20/21

## 2023-02-08 NOTE — Assessment & Plan Note (Signed)
Hx of aute sigmoid diverticulitis with pneumoperitoneum, f/u Dr. [name], diarrhea on and off, negative C-diff while in hospital.

## 2023-02-08 NOTE — Assessment & Plan Note (Signed)
Asthma Epinephrine prn, Allegra, Flonase, Singulair

## 2023-02-08 NOTE — Assessment & Plan Note (Signed)
off acid reducer. Hgb 11.0 10/30/22

## 2023-02-08 NOTE — Assessment & Plan Note (Signed)
takes Atorvastatin  

## 2023-02-08 NOTE — Progress Notes (Addendum)
Location:   [place] Room Number: Cyndi Bender Place of Service:  Clinic (12) Provider: Marlana Latus NP  Code Status: DNR Goals of Care:     01/31/2023    1:09 PM  Advanced Directives  Does Patient Have a Medical Advance Directive? Yes  Type of Paramedic of [place];Living will;Out of facility DNR (pink MOST or yellow form)  Does patient want to make changes to medical advance directive? No - Patient declined  Copy of [place] in Chart? No - copy requested     Chief Complaint  Patient presents with   Acute Visit    Patient is being seen for pain on left lower quadrant for last several days    HPI: Patient is a 89 y.o. female seen today for medical management of chronic diseases.   C/o left groin/hip pain with movement for 2-3 days, denied fall or injury, warm compress and rest helps. Tramadol '100mg'$  q6hr prn the patient stated it worked, but reduced to '100mg'$  am, '50mg'$  q6hr prn is not adequate. Denied nausea, vomiting, last BM yesterday, no urinary symptoms. Afebrile.   The right middle finger stiffness, difficulty eating.   01/31/23 saw Dr. Lyndel Safe for frequent urination, UA C/S 2 weeks since 01/31/23, resolved, denied dysuria, urinary frequency or urgency  Vaginal candidiasis on 5 day course of Diflucan since 01/31/33, prn Nystatin, healed.   Chronic diastolic heart failure, DOE, on and off wheezing, cough, occasional yellow phlegm in am. CXR no evidence of acute infection 10/2022. EF 60-65% 05/22/22             Hx of sinus tachycardia, heart rate is in control.              Hx of aute sigmoid diverticulitis with pneumoperitoneum, f/u Dr. [name], diarrhea on and off, negative C-diff while in hospital.              Asthma Epinephrine prn, Allegra, Flonase, Singulair             RA/fibromyalgia, flared up, down to maintenance dose Prednisone, Tramadol, TSH 0.865 02/27/22             GERD off acid reducer. Hgb 11.0 10/30/22              Venous insufficiency/Edema , takes Furosemide, Kcl. Bun/creat 11/0.94 10/30/22, K 4.0 10/31/22             OP Rheumatology, off Alendronate, on Vit D, t score -1.8 06/06/21             Hyperlipidemia, takes Atorvastatin             Vit B12 deficiency, on Vit B12 po, Vit B12 235 06/20/21             CT left adnexa cyst 11/2020, MRI 05/08/21,  05/22/22 renal US shoed No evidence of renal mass or hydronephrosis.             Gout, R ankle, stable, takes Colchicine             Hx of DVT, venous US BLE was negative for DVT while in hospital. Off Eliquis.          Past Medical History:  Diagnosis Date   Adrenal failure ([place])    Arthritis    Asthma    Cancer ([place])    Cataract    Closed nondisplaced fracture of fifth right metatarsal bone 09/18/2017   Diverticulitis  Per patient   E coli bacteremia    Fibromyalgia 2008   GERD (gastroesophageal reflux disease) 12/18/2016   HCAP (healthcare-associated pneumonia) 02/03/2018   Hyperlipidemia 10/13/2021   Osteoporosis    RA (rheumatoid arthritis) (HCC)    Recurrent upper respiratory infection (URI)    Sepsis due to urinary tract infection ([place]) 01/18/2018   Urticaria     Past Surgical History:  Procedure Laterality Date   BACK SURGERY     BILATERAL CARPAL TUNNEL RELEASE  2005   right and left   CATARACT EXTRACTION, BILATERAL  2004   right and left   CERVICAL FUSION  2011,2010,2008   2 disks   HEEL SPUR SURGERY  2004   lower back fusion  2011   Fusion of 3-4 and 4-5 lower back   RADIOFREQUENCY ABLATION  2020   ROTATOR CUFF REPAIR  [id number]   SQUAMOUS CELL CARCINOMA EXCISION     TONSILLECTOMY AND ADENOIDECTOMY  1947   TOTAL SHOULDER ARTHROPLASTY      Allergies  Allergen Reactions   Adhesive [Tape] Rash   Celebrex [Celecoxib] Hives   Ciprofibrate Nausea Only   Cymbalta [Duloxetine Hcl] Swelling   Gabitril [Tiagabine] Swelling   Lyrica [Pregabalin] Swelling   Neurontin [Gabapentin] Swelling   Nexium [Esomeprazole]  Rash   Nsaids Rash   Shrimp [Shellfish Allergy] Anaphylaxis    Per patient "shrimp only"   Azactam [Aztreonam]     Hand swelling    Azelastine Hcl     Rash    Ciprofloxacin Other (See Comments)    dizziness   Claritin [Loratadine]     Irritability Nervousness    Methotrexate Derivatives    Nasacort [Triamcinolone]     Dizzy    Olopatadine Other (See Comments)    Pain and lethargy    Other    Sulfamethizole Other (See Comments)    unknown   Zantac [Ranitidine Hcl] Other (See Comments)    unknown   Claritin-D 12 Hour [Loratadine-Pseudoephedrine Er] Anxiety   Keflex [Cephalexin] Nausea And Vomiting    Tolerated Ancef   Penicillins Rash    Injection site reaction. Tolerated cefepime in past. Also reports tolerating a penicillin infusion after this initial rxn ~20 yrs ago.  Has patient had a PCN reaction causing immediate rash, facial/tongue/throat swelling, SOB or lightheadedness with hypotension: No Has patient had a PCN reaction causing severe rash involving mucus membranes or skin necrosis: No Has patient had a PCN reaction that required hospitalization No Has patient had a PCN reaction occurring within the last 10 years: No     Sulfa Antibiotics Nausea And Vomiting    Allergies as of 02/08/2023       Reactions   Adhesive [tape] Rash   Celebrex [celecoxib] Hives   Ciprofibrate Nausea Only   Cymbalta [duloxetine Hcl] Swelling   Gabitril [tiagabine] Swelling   Lyrica [pregabalin] Swelling   Neurontin [gabapentin] Swelling   Nexium [esomeprazole] Rash   Nsaids Rash   Shrimp [shellfish Allergy] Anaphylaxis   Per patient "shrimp only"   Azactam [aztreonam]    Hand swelling    Azelastine Hcl    Rash    Ciprofloxacin Other (See Comments)   dizziness   Claritin [loratadine]    Irritability Nervousness    Methotrexate Derivatives    Nasacort [triamcinolone]    Dizzy    Olopatadine Other (See Comments)   Pain and lethargy    Other    Sulfamethizole Other (See  Comments)   unknown   Zantac [ranitidine Hcl]  Other (See Comments)   unknown   Claritin-d 12 Hour [loratadine-pseudoephedrine Er] Anxiety   Keflex [cephalexin] Nausea And Vomiting   Tolerated Ancef   Penicillins Rash   Injection site reaction. Tolerated cefepime in past. Also reports tolerating a penicillin infusion after this initial rxn ~20 yrs ago.  Has patient had a PCN reaction causing immediate rash, facial/tongue/throat swelling, SOB or lightheadedness with hypotension: No Has patient had a PCN reaction causing severe rash involving mucus membranes or skin necrosis: No Has patient had a PCN reaction that required hospitalization No Has patient had a PCN reaction occurring within the last 10 years: No   Sulfa Antibiotics Nausea And Vomiting        Medication List        Accurate as of February 08, 2023  3:26 PM. If you have any questions, ask your nurse or doctor.          atorvastatin 20 MG tablet Commonly known as: LIPITOR TAKE 1 TABLET BY MOUTH EVERY DAY   Biotin 5 MG Tbdp Take 1 tablet (5 mg total) by mouth daily.   colchicine 0.6 MG tablet TAKE 1 TABLET BY MOUTH EVERY DAY   CRANBERRY PO Take 1 capsule by mouth daily.   cyanocobalamin 1000 MCG tablet Commonly known as: VITAMIN B12 Take 1 tablet (1,000 mcg total) by mouth daily.   cycloSPORINE 0.05 % ophthalmic emulsion Commonly known as: RESTASIS Place 1 drop into both eyes 2 (two) times daily.   diclofenac Sodium 1 % Gel Commonly known as: VOLTAREN APPLY 2 TO 4 GRAMS TOPICALLY TO AFFECTED JOINTS UP TO 4 TIMES DAILY What changed:  how much to take how to take this when to take this reasons to take this   fexofenadine 180 MG tablet Commonly known as: ALLEGRA Take 1 tablet (180 mg total) by mouth daily.   fluconazole 100 MG tablet Commonly known as: Diflucan Take 1 tablet (100 mg total) by mouth daily.   furosemide 80 MG tablet Commonly known as: LASIX Take 1 tablet (80 mg total) by mouth  daily.   lansoprazole 30 MG capsule Commonly known as: PREVACID TAKE 1 CAPSULE BY MOUTH ONCE DAILY AT NOON What changed: See the new instructions.   Melatonin 10 MG Tabs Take 10 mg by mouth at bedtime.   montelukast 10 MG tablet Commonly known as: SINGULAIR TAKE 1 TABLET BY MOUTH EVERYDAY AT BEDTIME What changed: See the new instructions.   potassium chloride 10 MEQ CR capsule Commonly known as: MICRO-K Take 2 capsules (20 mEq total) by mouth 2 (two) times daily.   predniSONE 10 MG tablet Commonly known as: DELTASONE TAKE 1 TABLET (10 MG TOTAL) BY MOUTH DAILY WITH BREAKFAST.   predniSONE 10 MG tablet Commonly known as: DELTASONE Take 1 tablet (10 mg total) by mouth daily with breakfast. Take 4 tab x 2 days, 3 tab x 2 days 2 tab x 2 days then continue with 1 tab/day   predniSONE 5 MG tablet Commonly known as: DELTASONE Take 4 tabs po qd x 7 days, 3.5  tabs po qd x 7 days, 3  tabs po qd x 7 days.   traMADol HCl 100 MG Tabs Take 100 mg by mouth every 6 (six) hours as needed. What changed:  when to take this reasons to take this Another medication with the same name was removed. Continue taking this medication, and follow the directions you see here. Changed by: Cari Vandeberg X Hulbert Branscome, NP   Vitamin D3 125 MCG (5000 UT)  capsule Generic drug: Cholecalciferol TAKE 1 CAPSULE BY MOUTH EVERY DAY        Review of Systems:  Review of Systems  Constitutional:  Negative for appetite change, fatigue and fever.  HENT:  Negative for congestion, postnasal drip, sinus pressure and sore throat.   Eyes:  Negative for visual disturbance.  Respiratory:  Positive for shortness of breath. Negative for cough, chest tightness and wheezing.        DOE, occasional yellow phlegm in am, hacking cough.   Cardiovascular:  Negative for leg swelling.  Gastrointestinal:  Negative for abdominal pain and constipation.       On and off diarrhea.   Genitourinary:  Negative for dysuria and urgency.        Incontinent of urine.   Musculoskeletal:  Positive for arthralgias, back pain, gait problem and myalgias.       Right lower back hip pain, travels down to the right leg. Left groin/hip region pain with movement, able to walk if Tramadol use.   Skin:  Negative for color change.  Neurological:  Negative for speech difficulty, weakness and light-headedness.  Psychiatric/Behavioral:  Negative for confusion and sleep disturbance. The patient is not nervous/anxious.     Health Maintenance  Topic Date Due   DTaP/Tdap/Td (1 - Tdap) Never done   Medicare Annual Wellness (AWV)  01/19/2023   COVID-19 Vaccine (5 - 2023-24 season) 03/11/2024 (Originally 08/11/2022)   Pneumonia Vaccine 42+ Years old  Completed   INFLUENZA VACCINE  Completed   DEXA SCAN  Completed   Zoster Vaccines- Shingrix  Completed   HPV VACCINES  Aged Out    Physical Exam: Vitals:   02/08/23 1447  BP: (!) 122/90  Pulse: 97  Resp: 18  Temp: (!) 97.3 F (36.3 C)  SpO2: 96%  Weight: 180 lb (81.6 kg)  Height: 5' 2.5" (1.588 m)   Body mass index is 32.4 kg/m. Physical Exam Vitals and nursing note reviewed.  Constitutional:      Appearance: Normal appearance.  HENT:     Head: Normocephalic and atraumatic.     Ears:     Comments: A small abrasion from hearing aid at the opening of the right external ear canal, not infected.     Nose: Nose normal. No rhinorrhea.     Mouth/Throat:     Mouth: Mucous membranes are moist.     Pharynx: No oropharyngeal exudate or posterior oropharyngeal erythema.  Eyes:     Extraocular Movements: Extraocular movements intact.     Conjunctiva/sclera: Conjunctivae normal.     Pupils: Pupils are equal, round, and reactive to light.  Cardiovascular:     Rate and Rhythm: Normal rate and regular rhythm.     Heart sounds: No murmur heard. Pulmonary:     Effort: Pulmonary effort is normal.     Breath sounds: No rales.  Abdominal:     General: Bowel sounds are normal.     Palpations: Abdomen  is soft.     Tenderness: There is no abdominal tenderness.  Musculoskeletal:        General: Tenderness present.     Cervical back: Normal range of motion and neck supple.     Right lower leg: No edema.     Left lower leg: No edema.     Comments: Left groin/hip region pain with left leg movement, denied fall or injury, negative straight leg raise, able to walker if takes Tramadol.   Skin:    General: Skin is warm and  dry.     Comments: Lots of moles.   Neurological:     General: No focal deficit present.     Mental Status: She is alert and oriented to person, place, and time. Mental status is at baseline.     Gait: Gait abnormal.  Psychiatric:        Mood and Affect: Mood normal.        Behavior: Behavior normal.        Thought Content: Thought content normal.        Judgment: Judgment normal.     Labs reviewed: Basic Metabolic Panel: Recent Labs    02/27/22 1635 05/21/22 1205 05/28/22 0551 05/29/22 0419 05/30/22 0514 10/27/22 0945 10/28/22 0357 10/29/22 0419 10/30/22 1216 10/31/22 0415  NA  --    < > 140 140 143   < > 143 144 143  --   K  --    < > 3.3* 4.1 3.9   < > 3.3* 2.9* 3.1* 4.0  CL  --    < > 99 102 104   < > 109 111 109  --   CO2  --    < > '30 29 30   '$ < > '27 24 24  '$ --   GLUCOSE  --    < > 114* 178* 157*   < > 152* 103* 98  --   BUN  --    < > 9 20 24*   < > '10 13 11  '$ --   CREATININE  --    < > 0.83 0.85 0.87   < > 1.05* 0.84 0.94  --   CALCIUM  --    < > 9.5 9.5 9.1   < > 8.9 8.8* 8.6*  --   MG  --    < > 2.0 2.3 2.2  --   --   --   --   --   TSH 0.865  --   --   --   --   --   --   --   --   --    < > = values in this interval not displayed.   Liver Function Tests: Recent Labs    05/27/22 0543 10/27/22 0945 10/28/22 0357  AST 14* 25 19  ALT '10 17 14  '$ ALKPHOS 49 61 47  BILITOT 0.8 1.4* 1.1  PROT 6.1* 7.6 6.3*  ALBUMIN 2.9* 3.7 2.7*   Recent Labs    05/21/22 1205  LIPASE 22   No results for input(s): "AMMONIA" in the last 8760  hours. CBC: Recent Labs    10/27/22 0945 10/28/22 0357 10/29/22 0343 10/30/22 1216  WBC 18.6* 17.1* 13.6* 11.9*  NEUTROABS 12.7* 14.6* 10.1*  --   HGB 13.0 10.9* 9.8* 11.0*  HCT 41.9 35.8* 32.8* 37.5  MCV 97.2 98.6 99.7 103.0*  PLT 280 240 221 246   Lipid Panel: No results for input(s): "CHOL", "HDL", "LDLCALC", "TRIG", "CHOLHDL", "LDLDIRECT" in the last 8760 hours. No results found for: "HGBA1C"  Procedures since last visit: No results found.  Assessment/Plan  Urinary frequency 01/31/23 saw Dr. Lyndel Safe for frequent urination, UA C/S 2 weeks since 01/31/23  Vaginal candidiasis on 5 day course of Diflucan since 01/31/33, prn Nystatin.   Chronic diastolic heart failure (HCC) Chronic diastolic heart failure, DOE, on and off wheezing, cough, occasional yellow phlegm in am. CXR no evidence of acute infection 10/2022. EF 60-65% 05/22/22  Sinus tachycardia   Hx of sinus tachycardia, heart rate  is in control.   Perforation of sigmoid colon due to diverticulitis Hx of aute sigmoid diverticulitis with pneumoperitoneum, f/u Dr. [name], diarrhea on and off, negative C-diff while in hospital.   Asthma   Asthma Epinephrine prn, Allegra, Flonase, Singulair  Fibromyalgia RA/fibromyalgia, flared up, down to maintenance does of Prednisone, Tramadol, TSH 0.865 02/27/22  GERD (gastroesophageal reflux disease) off acid reducer. Hgb 11.0 10/30/22  Edema of both lower extremities due to peripheral venous insufficiency Venous insufficiency/Edema , takes Furosemide, Kcl. Bun/creat 11/0.94 10/30/22, K 4.0 10/31/22  Age-related osteoporosis without current pathological fracture OP Rheumatology, off Alendronate, on Vit D, t score -1.8 06/06/21  Hyperlipidemia takes Atorvastatin  B12 deficiency  on Vit B12 po, Vit B12 235 06/20/21  Gout R ankle, stable, takes Colchicine  Left hip pain C/o left groin/hip pain with movement for 2-3 days, denied fall or injury, warm compress and rest helps.  Tramadol '100mg'$  q6hr prn the patient stated it worked, but reduced to '100mg'$  am, '50mg'$  q6hr prn is not adequate. Denied nausea, vomiting, last BM yesterday, no urinary symptoms. Afebrile. May X-ray pelvis, hips if no better.    Labs/tests ordered:  none  Next appt:  02/15/2023

## 2023-02-12 ENCOUNTER — Inpatient Hospital Stay (HOSPITAL_COMMUNITY)
Admission: EM | Admit: 2023-02-12 | Discharge: 2023-02-16 | DRG: 092 | Disposition: A | Discharge status: Skilled Nursing Facility | Payer: Medicare PPO | Attending: Internal Medicine | Admitting: Internal Medicine

## 2023-02-12 ENCOUNTER — Emergency Department (HOSPITAL_COMMUNITY): Payer: Medicare PPO

## 2023-02-12 ENCOUNTER — Inpatient Hospital Stay (HOSPITAL_COMMUNITY): Payer: Medicare PPO

## 2023-02-12 ENCOUNTER — Other Ambulatory Visit: Payer: Self-pay

## 2023-02-12 ENCOUNTER — Encounter (HOSPITAL_COMMUNITY): Payer: Self-pay

## 2023-02-12 DIAGNOSIS — G928 Other toxic encephalopathy: Secondary | ICD-10-CM | POA: Diagnosis not present

## 2023-02-12 DIAGNOSIS — M069 Rheumatoid arthritis, unspecified: Secondary | ICD-10-CM | POA: Diagnosis not present

## 2023-02-12 DIAGNOSIS — S80211A Abrasion, right knee, initial encounter: Secondary | ICD-10-CM | POA: Diagnosis present

## 2023-02-12 DIAGNOSIS — I10 Essential (primary) hypertension: Secondary | ICD-10-CM | POA: Diagnosis not present

## 2023-02-12 DIAGNOSIS — Y92009 Unspecified place in unspecified non-institutional (private) residence as the place of occurrence of the external cause: Secondary | ICD-10-CM | POA: Diagnosis not present

## 2023-02-12 DIAGNOSIS — E785 Hyperlipidemia, unspecified: Secondary | ICD-10-CM | POA: Diagnosis not present

## 2023-02-12 DIAGNOSIS — Z981 Arthrodesis status: Secondary | ICD-10-CM | POA: Diagnosis not present

## 2023-02-12 DIAGNOSIS — Z882 Allergy status to sulfonamides status: Secondary | ICD-10-CM

## 2023-02-12 DIAGNOSIS — J45909 Unspecified asthma, uncomplicated: Secondary | ICD-10-CM | POA: Diagnosis not present

## 2023-02-12 DIAGNOSIS — W19XXXA Unspecified fall, initial encounter: Secondary | ICD-10-CM | POA: Diagnosis present

## 2023-02-12 DIAGNOSIS — E119 Type 2 diabetes mellitus without complications: Secondary | ICD-10-CM | POA: Diagnosis not present

## 2023-02-12 DIAGNOSIS — M810 Age-related osteoporosis without current pathological fracture: Secondary | ICD-10-CM | POA: Diagnosis present

## 2023-02-12 DIAGNOSIS — M5116 Intervertebral disc disorders with radiculopathy, lumbar region: Secondary | ICD-10-CM | POA: Diagnosis not present

## 2023-02-12 DIAGNOSIS — Z833 Family history of diabetes mellitus: Secondary | ICD-10-CM

## 2023-02-12 DIAGNOSIS — Z1152 Encounter for screening for COVID-19: Secondary | ICD-10-CM | POA: Diagnosis not present

## 2023-02-12 DIAGNOSIS — Z8249 Family history of ischemic heart disease and other diseases of the circulatory system: Secondary | ICD-10-CM | POA: Diagnosis not present

## 2023-02-12 DIAGNOSIS — S80212A Abrasion, left knee, initial encounter: Secondary | ICD-10-CM | POA: Diagnosis present

## 2023-02-12 DIAGNOSIS — R6521 Severe sepsis with septic shock: Secondary | ICD-10-CM | POA: Diagnosis not present

## 2023-02-12 DIAGNOSIS — Z7401 Bed confinement status: Secondary | ICD-10-CM | POA: Diagnosis not present

## 2023-02-12 DIAGNOSIS — Z043 Encounter for examination and observation following other accident: Secondary | ICD-10-CM | POA: Diagnosis not present

## 2023-02-12 DIAGNOSIS — E860 Dehydration: Secondary | ICD-10-CM | POA: Diagnosis present

## 2023-02-12 DIAGNOSIS — K219 Gastro-esophageal reflux disease without esophagitis: Secondary | ICD-10-CM | POA: Diagnosis present

## 2023-02-12 DIAGNOSIS — A419 Sepsis, unspecified organism: Secondary | ICD-10-CM | POA: Diagnosis not present

## 2023-02-12 DIAGNOSIS — R609 Edema, unspecified: Secondary | ICD-10-CM | POA: Diagnosis not present

## 2023-02-12 DIAGNOSIS — I110 Hypertensive heart disease with heart failure: Secondary | ICD-10-CM | POA: Diagnosis not present

## 2023-02-12 DIAGNOSIS — M797 Fibromyalgia: Secondary | ICD-10-CM | POA: Diagnosis present

## 2023-02-12 DIAGNOSIS — Z87891 Personal history of nicotine dependence: Secondary | ICD-10-CM | POA: Diagnosis not present

## 2023-02-12 DIAGNOSIS — R6510 Systemic inflammatory response syndrome (SIRS) of non-infectious origin without acute organ dysfunction: Secondary | ICD-10-CM | POA: Diagnosis present

## 2023-02-12 DIAGNOSIS — M79673 Pain in unspecified foot: Secondary | ICD-10-CM | POA: Diagnosis not present

## 2023-02-12 DIAGNOSIS — I5032 Chronic diastolic (congestive) heart failure: Secondary | ICD-10-CM | POA: Diagnosis present

## 2023-02-12 DIAGNOSIS — Z7952 Long term (current) use of systemic steroids: Secondary | ICD-10-CM | POA: Diagnosis not present

## 2023-02-12 DIAGNOSIS — G8929 Other chronic pain: Secondary | ICD-10-CM | POA: Diagnosis present

## 2023-02-12 DIAGNOSIS — Z8639 Personal history of other endocrine, nutritional and metabolic disease: Secondary | ICD-10-CM

## 2023-02-12 DIAGNOSIS — Z91048 Other nonmedicinal substance allergy status: Secondary | ICD-10-CM

## 2023-02-12 DIAGNOSIS — R6520 Severe sepsis without septic shock: Secondary | ICD-10-CM | POA: Diagnosis not present

## 2023-02-12 DIAGNOSIS — E876 Hypokalemia: Secondary | ICD-10-CM | POA: Diagnosis not present

## 2023-02-12 DIAGNOSIS — N179 Acute kidney failure, unspecified: Secondary | ICD-10-CM | POA: Diagnosis present

## 2023-02-12 DIAGNOSIS — Z79899 Other long term (current) drug therapy: Secondary | ICD-10-CM

## 2023-02-12 DIAGNOSIS — Z886 Allergy status to analgesic agent status: Secondary | ICD-10-CM

## 2023-02-12 DIAGNOSIS — R509 Fever, unspecified: Secondary | ICD-10-CM

## 2023-02-12 DIAGNOSIS — R000 Tachycardia, unspecified: Secondary | ICD-10-CM | POA: Diagnosis not present

## 2023-02-12 DIAGNOSIS — M79641 Pain in right hand: Secondary | ICD-10-CM | POA: Diagnosis not present

## 2023-02-12 DIAGNOSIS — E099 Drug or chemical induced diabetes mellitus without complications: Secondary | ICD-10-CM | POA: Diagnosis not present

## 2023-02-12 DIAGNOSIS — M5126 Other intervertebral disc displacement, lumbar region: Secondary | ICD-10-CM | POA: Diagnosis not present

## 2023-02-12 DIAGNOSIS — N183 Chronic kidney disease, stage 3 unspecified: Secondary | ICD-10-CM | POA: Diagnosis present

## 2023-02-12 DIAGNOSIS — G9341 Metabolic encephalopathy: Secondary | ICD-10-CM | POA: Diagnosis not present

## 2023-02-12 DIAGNOSIS — A4151 Sepsis due to Escherichia coli [E. coli]: Secondary | ICD-10-CM | POA: Diagnosis not present

## 2023-02-12 DIAGNOSIS — Z0389 Encounter for observation for other suspected diseases and conditions ruled out: Secondary | ICD-10-CM | POA: Diagnosis not present

## 2023-02-12 DIAGNOSIS — Z91013 Allergy to seafood: Secondary | ICD-10-CM

## 2023-02-12 DIAGNOSIS — M0579 Rheumatoid arthritis with rheumatoid factor of multiple sites without organ or systems involvement: Secondary | ICD-10-CM | POA: Diagnosis not present

## 2023-02-12 DIAGNOSIS — N170 Acute kidney failure with tubular necrosis: Secondary | ICD-10-CM | POA: Diagnosis not present

## 2023-02-12 DIAGNOSIS — M6281 Muscle weakness (generalized): Secondary | ICD-10-CM | POA: Diagnosis not present

## 2023-02-12 DIAGNOSIS — Z9841 Cataract extraction status, right eye: Secondary | ICD-10-CM

## 2023-02-12 DIAGNOSIS — G9340 Encephalopathy, unspecified: Secondary | ICD-10-CM | POA: Diagnosis not present

## 2023-02-12 DIAGNOSIS — T40425A Adverse effect of tramadol, initial encounter: Secondary | ICD-10-CM | POA: Diagnosis present

## 2023-02-12 DIAGNOSIS — M5416 Radiculopathy, lumbar region: Secondary | ICD-10-CM | POA: Diagnosis not present

## 2023-02-12 DIAGNOSIS — Z9842 Cataract extraction status, left eye: Secondary | ICD-10-CM

## 2023-02-12 DIAGNOSIS — T380X5D Adverse effect of glucocorticoids and synthetic analogues, subsequent encounter: Secondary | ICD-10-CM | POA: Diagnosis not present

## 2023-02-12 DIAGNOSIS — Z888 Allergy status to other drugs, medicaments and biological substances status: Secondary | ICD-10-CM

## 2023-02-12 DIAGNOSIS — Z96619 Presence of unspecified artificial shoulder joint: Secondary | ICD-10-CM | POA: Diagnosis present

## 2023-02-12 DIAGNOSIS — R109 Unspecified abdominal pain: Secondary | ICD-10-CM | POA: Diagnosis not present

## 2023-02-12 DIAGNOSIS — M8580 Other specified disorders of bone density and structure, unspecified site: Secondary | ICD-10-CM | POA: Diagnosis not present

## 2023-02-12 DIAGNOSIS — Z880 Allergy status to penicillin: Secondary | ICD-10-CM

## 2023-02-12 DIAGNOSIS — R29818 Other symptoms and signs involving the nervous system: Secondary | ICD-10-CM | POA: Diagnosis not present

## 2023-02-12 LAB — CBC WITH DIFFERENTIAL/PLATELET
Abs Immature Granulocytes: 0.1 10*3/uL — ABNORMAL HIGH (ref 0.00–0.07)
Basophils Absolute: 0.1 10*3/uL (ref 0.0–0.1)
Basophils Relative: 1 %
Eosinophils Absolute: 0.2 10*3/uL (ref 0.0–0.5)
Eosinophils Relative: 1 %
HCT: 44.7 % (ref 36.0–46.0)
Hemoglobin: 13.7 g/dL (ref 12.0–15.0)
Immature Granulocytes: 1 %
Lymphocytes Relative: 20 %
Lymphs Abs: 3.2 10*3/uL (ref 0.7–4.0)
MCH: 29.6 pg (ref 26.0–34.0)
MCHC: 30.6 g/dL (ref 30.0–36.0)
MCV: 96.5 fL (ref 80.0–100.0)
Monocytes Absolute: 1.4 10*3/uL — ABNORMAL HIGH (ref 0.1–1.0)
Monocytes Relative: 9 %
Neutro Abs: 10.8 10*3/uL — ABNORMAL HIGH (ref 1.7–7.7)
Neutrophils Relative %: 68 %
Platelets: 278 10*3/uL (ref 150–400)
RBC: 4.63 MIL/uL (ref 3.87–5.11)
RDW: 15.2 % (ref 11.5–15.5)
WBC: 15.7 10*3/uL — ABNORMAL HIGH (ref 4.0–10.5)
nRBC: 0 % (ref 0.0–0.2)

## 2023-02-12 LAB — COMPREHENSIVE METABOLIC PANEL
ALT: 15 U/L (ref 0–44)
AST: 26 U/L (ref 15–41)
Albumin: 3.8 g/dL (ref 3.5–5.0)
Alkaline Phosphatase: 61 U/L (ref 38–126)
Anion gap: 13 (ref 5–15)
BUN: 17 mg/dL (ref 8–23)
CO2: 27 mmol/L (ref 22–32)
Calcium: 9.4 mg/dL (ref 8.9–10.3)
Chloride: 99 mmol/L (ref 98–111)
Creatinine, Ser: 1.09 mg/dL — ABNORMAL HIGH (ref 0.44–1.00)
GFR, Estimated: 49 mL/min — ABNORMAL LOW (ref >60)
Glucose, Bld: 125 mg/dL — ABNORMAL HIGH (ref 70–99)
Potassium: 4.1 mmol/L (ref 3.5–5.1)
Sodium: 139 mmol/L (ref 135–145)
Total Bilirubin: 1.4 mg/dL — ABNORMAL HIGH (ref 0.3–1.2)
Total Protein: 7.9 g/dL (ref 6.5–8.1)

## 2023-02-12 LAB — URINALYSIS, W/ REFLEX TO CULTURE (INFECTION SUSPECTED)
Bilirubin Urine: NEGATIVE
Glucose, UA: NEGATIVE mg/dL
Ketones, ur: 20 mg/dL — AB
Leukocytes,Ua: NEGATIVE
Nitrite: NEGATIVE
Protein, ur: NEGATIVE mg/dL
Specific Gravity, Urine: 1.017 (ref 1.005–1.030)
pH: 6 (ref 5.0–8.0)

## 2023-02-12 LAB — RESP PANEL BY RT-PCR (RSV, FLU A&B, COVID)  RVPGX2
Influenza A by PCR: NEGATIVE
Influenza B by PCR: NEGATIVE
Resp Syncytial Virus by PCR: NEGATIVE
SARS Coronavirus 2 by RT PCR: NEGATIVE

## 2023-02-12 LAB — LACTIC ACID, PLASMA
Lactic Acid, Venous: 2.3 mmol/L (ref 0.5–1.9)
Lactic Acid, Venous: 2.6 mmol/L (ref 0.5–1.9)
Lactic Acid, Venous: 1.4 mmol/L (ref 0.5–1.9)
Lactic Acid, Venous: 1.3 mmol/L (ref 0.5–1.9)

## 2023-02-12 LAB — PROTIME-INR
INR: 1.1 (ref 0.8–1.2)
Prothrombin Time: 13.9 seconds (ref 11.4–15.2)

## 2023-02-12 LAB — APTT: aPTT: 26 seconds (ref 24–36)

## 2023-02-12 MED ORDER — VANCOMYCIN HCL IN DEXTROSE 1-5 GM/200ML-% IV SOLN: 1000.0000 mg | Freq: Once | INTRAVENOUS | Status: DC

## 2023-02-12 MED ORDER — LACTATED RINGERS IV SOLN: 150.0000 mL/h | INTRAVENOUS | Status: DC

## 2023-02-12 MED ORDER — CYCLOSPORINE 0.05 % OP EMUL
1.0000 [drp] | Freq: Two times a day (BID) | OPHTHALMIC | Status: DC
  Administered 2023-02-12 – 2023-02-16 (×8): 1 [drp] via OPHTHALMIC
  Filled 2023-02-12 (×10): qty 30

## 2023-02-12 MED ORDER — MELATONIN 10 MG PO TABS: 10.0000 mg | ORAL_TABLET | Freq: Every day | ORAL | Status: DC

## 2023-02-12 MED ORDER — ENOXAPARIN SODIUM 40 MG/0.4ML IJ SOSY
40.0000 mg | PREFILLED_SYRINGE | INTRAMUSCULAR | Status: DC
  Administered 2023-02-12 – 2023-02-15 (×4): 40 mg via SUBCUTANEOUS
  Filled 2023-02-12 (×4): qty 0.4

## 2023-02-12 MED ORDER — ONDANSETRON HCL 4 MG PO TABS: 4.0000 mg | ORAL_TABLET | Freq: Four times a day (QID) | ORAL | Status: DC | PRN

## 2023-02-12 MED ORDER — SODIUM CHLORIDE 0.9 % IV SOLN: 2.0000 g | Freq: Two times a day (BID) | INTRAVENOUS | Status: DC

## 2023-02-12 MED ORDER — SODIUM CHLORIDE 0.9 % IV SOLN
2.0000 g | Freq: Two times a day (BID) | INTRAVENOUS | Status: DC
  Administered 2023-02-12 – 2023-02-14 (×4): 2 g via INTRAVENOUS
  Filled 2023-02-12 (×4): qty 12.5

## 2023-02-12 MED ORDER — METRONIDAZOLE 500 MG/100ML IV SOLN: 500.0000 mg | Freq: Two times a day (BID) | INTRAVENOUS | Status: DC

## 2023-02-12 MED ORDER — LACTATED RINGERS IV BOLUS (SEPSIS)
500.0000 mL | Freq: Once | INTRAVENOUS | Status: AC
  Administered 2023-02-12: 500 mL via INTRAVENOUS

## 2023-02-12 MED ORDER — ONDANSETRON HCL 4 MG/2ML IJ SOLN
4.0000 mg | Freq: Once | INTRAMUSCULAR | Status: AC
  Administered 2023-02-12: 4 mg via INTRAVENOUS
  Filled 2023-02-12: qty 2

## 2023-02-12 MED ORDER — VITAMIN B-12 1000 MCG PO TABS
1000.0000 ug | ORAL_TABLET | Freq: Every day | ORAL | Status: DC
  Administered 2023-02-12 – 2023-02-16 (×5): 1000 ug via ORAL
  Filled 2023-02-12 (×5): qty 1

## 2023-02-12 MED ORDER — ONDANSETRON HCL 4 MG/2ML IJ SOLN: 4.0000 mg | Freq: Four times a day (QID) | INTRAMUSCULAR | Status: DC | PRN

## 2023-02-12 MED ORDER — ACETAMINOPHEN 325 MG PO TABS
650.0000 mg | ORAL_TABLET | Freq: Once | ORAL | Status: AC
  Administered 2023-02-12: 650 mg via ORAL
  Filled 2023-02-12: qty 2

## 2023-02-12 MED ORDER — SODIUM CHLORIDE 0.9 % IV SOLN
2.0000 g | Freq: Once | INTRAVENOUS | Status: AC
  Administered 2023-02-12: 2 g via INTRAVENOUS
  Filled 2023-02-12: qty 12.5

## 2023-02-12 MED ORDER — LACTATED RINGERS IV SOLN: INTRAVENOUS | Status: DC

## 2023-02-12 MED ORDER — VITAMIN D 25 MCG (1000 UNIT) PO TABS
5000.0000 [IU] | ORAL_TABLET | Freq: Every day | ORAL | Status: DC
  Administered 2023-02-12 – 2023-02-16 (×5): 5000 [IU] via ORAL
  Filled 2023-02-12 (×5): qty 5

## 2023-02-12 MED ORDER — ATORVASTATIN CALCIUM 20 MG PO TABS
20.0000 mg | ORAL_TABLET | Freq: Every day | ORAL | Status: DC
  Administered 2023-02-13 – 2023-02-16 (×4): 20 mg via ORAL
  Filled 2023-02-12 (×4): qty 1

## 2023-02-12 MED ORDER — HYDROCORTISONE SOD SUC (PF) 100 MG IJ SOLR
100.0000 mg | Freq: Two times a day (BID) | INTRAMUSCULAR | Status: DC
  Administered 2023-02-12 – 2023-02-13 (×2): 100 mg via INTRAVENOUS
  Filled 2023-02-12 (×3): qty 2

## 2023-02-12 MED ORDER — VANCOMYCIN HCL 1500 MG/300ML IV SOLN
1500.0000 mg | Freq: Once | INTRAVENOUS | Status: AC
  Administered 2023-02-12: 1500 mg via INTRAVENOUS
  Filled 2023-02-12: qty 300

## 2023-02-12 MED ORDER — COLCHICINE 0.6 MG PO TABS
0.6000 mg | ORAL_TABLET | Freq: Every day | ORAL | Status: DC
  Administered 2023-02-12 – 2023-02-16 (×5): 0.6 mg via ORAL
  Filled 2023-02-12 (×5): qty 1

## 2023-02-12 MED ORDER — TRAMADOL HCL 50 MG PO TABS
100.0000 mg | ORAL_TABLET | Freq: Four times a day (QID) | ORAL | Status: DC | PRN
  Administered 2023-02-13: 100 mg via ORAL
  Filled 2023-02-12: qty 2

## 2023-02-12 MED ORDER — MONTELUKAST SODIUM 10 MG PO TABS
10.0000 mg | ORAL_TABLET | Freq: Every day | ORAL | Status: DC
  Administered 2023-02-12 – 2023-02-15 (×4): 10 mg via ORAL
  Filled 2023-02-12 (×4): qty 1

## 2023-02-12 MED ORDER — VANCOMYCIN HCL IN DEXTROSE 1-5 GM/200ML-% IV SOLN: 1000.0000 mg | INTRAVENOUS | Status: DC

## 2023-02-12 MED ORDER — IOHEXOL 300 MG/ML  SOLN
100.0000 mL | Freq: Once | INTRAMUSCULAR | Status: AC | PRN
  Administered 2023-02-12: 100 mL via INTRAVENOUS

## 2023-02-12 MED ORDER — ACETAMINOPHEN 325 MG PO TABS
650.0000 mg | ORAL_TABLET | Freq: Four times a day (QID) | ORAL | Status: DC | PRN
  Administered 2023-02-13 – 2023-02-16 (×2): 650 mg via ORAL
  Filled 2023-02-12 (×2): qty 2

## 2023-02-12 MED ORDER — ACETAMINOPHEN 650 MG RE SUPP: 650.0000 mg | Freq: Four times a day (QID) | RECTAL | Status: DC | PRN

## 2023-02-12 MED ORDER — METRONIDAZOLE 500 MG/100ML IV SOLN
500.0000 mg | Freq: Once | INTRAVENOUS | Status: AC
  Administered 2023-02-12: 500 mg via INTRAVENOUS
  Filled 2023-02-12: qty 100

## 2023-02-12 MED ORDER — METRONIDAZOLE 500 MG PO TABS
500.0000 mg | ORAL_TABLET | Freq: Two times a day (BID) | ORAL | Status: DC
  Administered 2023-02-12 – 2023-02-14 (×4): 500 mg via ORAL
  Filled 2023-02-12 (×4): qty 1

## 2023-02-12 NOTE — H&P (Addendum)
History and Physical    Patient: Eileen Wilkerson [id number] DOB: [birth year] DOA: 02/12/2023 DOS: the patient was seen and examined on 02/12/2023 PCP: Mast, [name], NP  Patient coming from: ALF/ILF  Chief Complaint:  Chief Complaint  Patient presents with   Fall   Most of the history was obtained from patient's daughter over the phone.  Patient is lethargic and unable to provide most of the history. HPI: Eileen Wilkerson is a 89 y.o. female with medical history significant for rheumatoid arthritis, GERD,, history of gout, history of adrenal insufficiency who presents to the emergency room from friends home after she fell on the morning of her admission while trying to wipe herself after using the bathroom.  She lives independently and has an aide coming for couple of hours a day.  She was able to call for help and EMS was called and she was brought to the ER.  Patient fell on her knees and was noted to have small abrasion on both knees. According to her daughter symptoms started about 2 weeks prior to presentation and she states that patient has been unable to bear weight on her left lower extremity due to pain in her left hip which is worse with movement or standing.  She does not know of any falls prior to the day of admission. She states that the patient's aide recently had the Flu.  Patient has not had any cough or shortness of breath but does complain of abdominal pain and had an episode of diarrhea 1 day prior to admission. I am unable to do review of systems on this patient due to lethargy.  She arouses easily but falls back to sleep.   Patient had a Tmax of 100.7, she was tachycardic with pulse rate of 99. Abnormal labs include an uptrending lactic acid level 2.3 >> 2.6, creatinine of 1.09 compared to baseline of 0.95 and a white count of 15,000 with predominant neutrophils. She received 500 cc IV fluid bolus in the ER and is currently on LR at 150 cc an hour for 20 hours.  She  received a dose of IV vancomycin, cefepime and Flagyl. She will be admitted to the hospital for further evaluation.   Review of Systems: unable to review all systems due to the inability of the patient to answer questions. Past Medical History:  Diagnosis Date   Adrenal failure ([place])    Arthritis    Asthma    Cancer ([place])    Cataract    Closed nondisplaced fracture of fifth right metatarsal bone 09/18/2017   Diverticulitis    Per patient   E coli bacteremia    Fibromyalgia 2008   GERD (gastroesophageal reflux disease) 12/18/2016   HCAP (healthcare-associated pneumonia) 02/03/2018   Hyperlipidemia 10/13/2021   Osteoporosis    RA (rheumatoid arthritis) ([place])    Recurrent upper respiratory infection (URI)    Sepsis due to urinary tract infection ([place]) 01/18/2018   Urticaria    Past Surgical History:  Procedure Laterality Date   BACK SURGERY     BILATERAL CARPAL TUNNEL RELEASE  2005   right and left   CATARACT EXTRACTION, BILATERAL  2004   right and left   CERVICAL FUSION  2011,2010,2008   2 disks   HEEL SPUR SURGERY  2004   lower back fusion  2011   Fusion of 3-4 and 4-5 lower back   RADIOFREQUENCY ABLATION  2020   ROTATOR CUFF REPAIR  [id number]   SQUAMOUS CELL CARCINOMA EXCISION  TONSILLECTOMY AND ADENOIDECTOMY  1947   TOTAL SHOULDER ARTHROPLASTY     Social History:  reports that she quit smoking about 56 years ago. Her smoking use included cigarettes. She has a 7.50 pack-year smoking history. She has never been exposed to tobacco smoke. She has never used smokeless tobacco. She reports that she does not drink alcohol and does not use drugs.  Allergies  Allergen Reactions   Adhesive [Tape] Rash   Celebrex [Celecoxib] Hives   Ciprofibrate Nausea Only   Cymbalta [Duloxetine Hcl] Swelling   Gabitril [Tiagabine] Swelling   Lyrica [Pregabalin] Swelling   Neurontin [Gabapentin] Swelling   Nexium [Esomeprazole] Rash   Nsaids Rash   Shrimp [Shellfish Allergy]  Anaphylaxis    Per patient "shrimp only"   Azactam [Aztreonam]     Hand swelling    Azelastine Hcl     Rash    Ciprofloxacin Other (See Comments)    dizziness   Claritin [Loratadine]     Irritability Nervousness    Methotrexate Derivatives    Nasacort [Triamcinolone]     Dizzy    Olopatadine Other (See Comments)    Pain and lethargy    Other    Sulfamethizole Other (See Comments)    unknown   Zantac [Ranitidine Hcl] Other (See Comments)    unknown   Claritin-D 12 Hour [Loratadine-Pseudoephedrine Er] Anxiety   Keflex [Cephalexin] Nausea And Vomiting    Tolerated Ancef   Penicillins Rash    Injection site reaction. Tolerated cefepime in past. Also reports tolerating a penicillin infusion after this initial rxn ~20 yrs ago.  Has patient had a PCN reaction causing immediate rash, facial/tongue/throat swelling, SOB or lightheadedness with hypotension: No Has patient had a PCN reaction causing severe rash involving mucus membranes or skin necrosis: No Has patient had a PCN reaction that required hospitalization No Has patient had a PCN reaction occurring within the last 10 years: No     Sulfa Antibiotics Nausea And Vomiting    Family History  Problem Relation Age of Onset   Heart attack Maternal Grandmother    Heart attack Paternal Grandfather    Breast cancer Mother 61   Diabetes Father    Heart disease Father    Congestive Heart Failure Father 72       Died from   Allergic rhinitis Neg Hx    Asthma Neg Hx    Eczema Neg Hx    Urticaria Neg Hx     Prior to Admission medications   Medication Sig Start Date End Date Taking? Authorizing Provider  atorvastatin (LIPITOR) 20 MG tablet TAKE 1 TABLET BY MOUTH EVERY DAY 07/13/22   Mast, [name], NP  Biotin 5 MG TBDP Take 1 tablet (5 mg total) by mouth daily. 06/20/22   Medina-Vargas, Monina C, NP  Cholecalciferol (VITAMIN D3) 125 MCG (5000 UT) CAPS TAKE 1 CAPSULE BY MOUTH EVERY DAY 07/24/22   Mast, [name], NP  colchicine 0.6 MG tablet  TAKE 1 TABLET BY MOUTH EVERY DAY 08/28/22   Mast, [name], NP  CRANBERRY PO Take 1 capsule by mouth daily.    [provider]  cycloSPORINE (RESTASIS) 0.05 % ophthalmic emulsion Place 1 drop into both eyes 2 (two) times daily. 06/20/22   Medina-Vargas, Monina C, NP  diclofenac Sodium (VOLTAREN) 1 % GEL APPLY 2 TO 4 GRAMS TOPICALLY TO AFFECTED JOINTS UP TO 4 TIMES DAILY Patient taking differently: Apply 2-4 g topically 4 (four) times daily as needed (pain). APPLY 2 TO 4  GRAMS TOPICALLY TO AFFECTED JOINTS UP TO 4 TIMES DAILY 06/20/22   Medina-Vargas, Monina C, NP  fexofenadine (ALLEGRA) 180 MG tablet Take 1 tablet (180 mg total) by mouth daily. 06/20/22   Medina-Vargas, Monina C, NP  fluconazole (DIFLUCAN) 100 MG tablet Take 1 tablet (100 mg total) by mouth daily. 01/31/23   [name], MD  furosemide (LASIX) 80 MG tablet Take 1 tablet (80 mg total) by mouth daily. 06/20/22   Medina-Vargas, Monina C, NP  lansoprazole (PREVACID) 30 MG capsule TAKE 1 CAPSULE BY MOUTH ONCE DAILY AT NOON Patient taking differently: Take 30 mg by mouth daily at 12 noon. 10/10/22   Mast, [name], NP  Melatonin 10 MG TABS Take 10 mg by mouth at bedtime.    [provider]  montelukast (SINGULAIR) 10 MG tablet TAKE 1 TABLET BY MOUTH EVERYDAY AT BEDTIME Patient taking differently: Take 10 mg by mouth at bedtime. 08/28/22   Mast, [name], NP  potassium chloride (MICRO-K) 10 MEQ CR capsule Take 2 capsules (20 mEq total) by mouth 2 (two) times daily. 06/20/22   Medina-Vargas, Monina C, NP  predniSONE (DELTASONE) 10 MG tablet TAKE 1 TABLET (10 MG TOTAL) BY MOUTH DAILY WITH BREAKFAST. 11/29/22   Mast, [name], NP  predniSONE (DELTASONE) 10 MG tablet Take 1 tablet (10 mg total) by mouth daily with breakfast. Take 4 tab x 2 days, 3 tab x 2 days 2 tab x 2 days then continue with 1 tab/day 12/27/22   Wardell Honour, MD  predniSONE (DELTASONE) 5 MG tablet Take 4 tabs po qd x 7 days, 3.5  tabs po qd x 7 days, 3  tabs po qd x 7  days. 01/10/23   Ofilia Neas, PA-C  traMADol HCl 100 MG TABS Take 100 mg by mouth every 6 (six) hours as needed. 02/08/23   Mast, [name], NP  vitamin B-12 (CYANOCOBALAMIN) 1000 MCG tablet Take 1 tablet (1,000 mcg total) by mouth daily. 06/20/22   Medina-Vargas, Jaymes Graff C, NP    Physical Exam: Vitals:   02/12/23 1330 02/12/23 1400 02/12/23 1430 02/12/23 1541  BP: (!) 125/57 (!) 113/58 127/70 135/70  Pulse: 99 99 98 94  Resp: '15 15 20 15  '$ Temp:    97.7 F (36.5 C)  TempSrc:    Oral  SpO2: 92% 93% 93% 96%  Weight:      Height:       Physical Exam Vitals and nursing note reviewed.  Constitutional:      Comments: Lethargic but arouses easily  HENT:     Head: Normocephalic and atraumatic.     Nose: Nose normal.     Mouth/Throat:     Mouth: Mucous membranes are dry.  Eyes:     Conjunctiva/sclera: Conjunctivae normal.  Cardiovascular:     Rate and Rhythm: Tachycardia present.  Pulmonary:     Effort: Pulmonary effort is normal.     Breath sounds: Normal breath sounds.  Abdominal:     General: Abdomen is flat.     Palpations: Abdomen is soft.     Tenderness: There is abdominal tenderness.     Comments: Periumbilical and lower quadrants.  Musculoskeletal:        General: Normal range of motion.     Cervical back: Normal range of motion and neck supple.  Skin:    General: Skin is warm and dry.  Neurological:     Motor: Weakness present.     Comments: Lethargic but arouses easily  Psychiatric:        Mood and Affect: Mood normal.        Behavior: Behavior normal.     Data Reviewed: Relevant notes from primary care and specialist visits, past discharge summaries as available in EHR, including Care Everywhere. Prior diagnostic testing as pertinent to current admission diagnoses Updated medications and problem lists for reconciliation ED course, including vitals, labs, imaging, treatment and response to treatment Triage notes, nursing and pharmacy notes and ED provider's  notes Notable results as noted in HPI Labs reviewed.  Sodium 139, potassium 4.1, chloride 99, bicarb 27, glucose 125, BUN 17, creatinine 1.09, calcium 9.4, total protein 7.9, albumin 3.8, AST 26, ALT 15, alkaline phosphatase 61, total bilirubin 1.4, PT 13.9, INR 1.1, white count 15.7, hemoglobin 13.7, hematocrit 35.7, platelet count 2 7 Left foot x-ray reviewed by me shows no evidence of fracture or dislocation. There is no evidence of arthropathy or other focal bone abnormality. Soft tissues are unremarkable. Chest x-ray reviewed by me shows no acute findings CT scan of abdomen and pelvis shows no acute traumatic injury identified. No acute or inflammatory process identified in the abdomen or pelvis. 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 Query left L3 radiculitis. Aortic Atherosclerosis. There are no new results to review at this time.  Assessment and Plan: * Sepsis ([place]) Patient presents to the ER for evaluation after a fall and was noted to have a fever with a Tmax of 100.7, she was also tachycardic and has an uptrending lactic acid level with a leukocytosis of 15.7 Unclear source of sepsis at this time but I am concerned for possible diverticulitis given her history of diverticulosis and abdominal tenderness on exam Continue aggressive IV fluid resuscitation Trend lactic acid levels Follow-up results of blood cultures Will treat patient empirically with Zosyn and vancomycin  Acute metabolic encephalopathy Probably secondary to acute illness Will obtain CT scan of the head without contrast Keep patient n.p.o. 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 No recent trauma or fall. Patient had a CT scan of abdomen and pelvis which showed chronic spine degeneration with new  caudal left lateral recess disc herniation at L2-L3 since last year. Query left L3 radiculitis.   Requested neurosurgery consult for further evaluation.  They want an MRI of the lumbar spine prior to seeing patient  AKI (acute kidney injury) [facility]) Patient noted to have very dry mucous membranes and poor skin turgor Baseline creatinine is 0.94 and on admission today it is 1.09 AKI appears to be secondary to poor oral intake. Hold furosemide Continue IV fluid hydration Repeat renal parameters in a.m.  Chronic diastolic heart failure ([facility]) Hold furosemide due to dehydration from poor oral intake Last known LVEF was 60 to 65% with mild LVH and grade 2 diastolic dysfunction from a 2D echocardiogram which was done 09/07/22   Long term current use of systemic steroids, for RA Will place patient on stress dose steroids due to acute illness  Rheumatoid arthritis ([place]) Continue stress dose steroids Switch patient back to oral prednisone once acute illness improves or resolves.         Advance Care Planning:   Code Status: Full Code   Consults: Neurosurgery  Family Communication: Greater than 50% of time was spent discussing patient's  condition and plan of care with her daughter Lendon Ka over the phone.  All questions and concerns have been addressed.  She verbalizes understanding and agrees with the plan.  CODE STATUS was discussed and her mother is a full code.  Severity of Illness: The appropriate patient status for this patient is INPATIENT. Inpatient status is judged to be reasonable and necessary in order to provide the required intensity of service to ensure the patient's safety. The patient's presenting symptoms, physical exam findings, and initial radiographic and laboratory data in the context of their chronic comorbidities is felt to place them at high risk for further clinical deterioration. Furthermore, it is not anticipated that the patient will be medically stable for  discharge from the hospital within 2 midnights of admission.   * I certify that at the point of admission it is my clinical judgment that the patient will require inpatient hospital care spanning beyond 2 midnights from the point of admission due to high intensity of service, high risk for further deterioration and high frequency of surveillance required.*  Author: Collier Bullock, MD 02/12/2023 5:18 PM  For on call review [email or web address].

## 2023-02-12 NOTE — Assessment & Plan Note (Deleted)
Patient presents to the ER for evaluation after a fall and was noted to have a fever with a Tmax of 100.7, she was also tachycardic and has an uptrending lactic acid level with a leukocytosis of 15.7 Unclear source of sepsis at this time but I am concerned for possible diverticulitis given her history of diverticulosis and abdominal tenderness on exam Continue aggressive IV fluid resuscitation Trend lactic acid levels Follow-up results of blood cultures Will treat patient empirically with Zosyn and vancomycin

## 2023-02-12 NOTE — Progress Notes (Signed)
A consult was received from an ED physician for vanc/cefepime per pharmacy dosing.  The patient's profile has been reviewed for ht/wt/allergies/indication/available labs.   A one time order has been placed for vanc '1500mg'$ , cefepime 2g.  Further antibiotics/pharmacy consults should be ordered by admitting physician if indicated.                       Thank you, Kara Mead 02/12/2023  12:55 PM

## 2023-02-12 NOTE — Assessment & Plan Note (Signed)
Will place patient on stress dose steroids due to acute illness

## 2023-02-12 NOTE — Sepsis Progress Note (Signed)
Sepsis protocol monitored by eLink ?

## 2023-02-12 NOTE — Assessment & Plan Note (Signed)
Probably secondary to acute illness Will obtain CT scan of the head without contrast Keep patient n.p.o. until more awake and alert

## 2023-02-12 NOTE — Plan of Care (Signed)
  Problem: Coping: Goal: Level of anxiety will decrease Outcome: Progressing   Problem: Pain Managment: Goal: General experience of comfort will improve Outcome: Progressing   Problem: Safety: Goal: Ability to remain free from injury will improve Outcome: Progressing   Problem: Skin Integrity: Goal: Risk for impaired skin integrity will decrease Outcome: Progressing   Problem: Respiratory: Goal: Ability to maintain adequate ventilation will improve Outcome: Progressing

## 2023-02-12 NOTE — ED Provider Notes (Signed)
[facility]GENC[facility] DEPARTMENT AT [facility] Provider Note   CSN: [id number] Arrival date & time: 02/12/23  1022     History  Chief Complaint  Patient presents with   Fall    Eileen Wilkerson is a 89 y.o. female.  Patient is an 89 year old female with a history of adrenal failure, RA, prior bacteremia and recurrent infections who is presenting today after a fall at home.  EMS reports that patient was in her bathroom today and fell forward off the toilet landing on her knees and ankles.  She denied any head injury or loss of consciousness but was complaining of pain in her ankles and did not bear weight.  On arrival to the hospital patient is very drowsy despite not getting any medications.  She can be woken up but reports generally she feels unwell.  It seems that this started today.  She reports an episode of diarrhea and feeling nauseated.  She has not had anything to eat.  She is also complaining of abdominal pain which seems to be in the upper abdomen.  She denies any noticeable urinary symptoms.  She denies having chest pain or shortness of breath.  She denies any cough.  However patient has to be woken up repeatedly and asked these questions concern for reliability.  Patient does take Eliquis but does multiple times deny hitting her head or losing consciousness when she fell off the toilet.  The history is provided by the patient, the EMS personnel and medical records.  Fall       Home Medications Prior to Admission medications   Medication Sig Start Date End Date Taking? Authorizing Provider  atorvastatin (LIPITOR) 20 MG tablet TAKE 1 TABLET BY MOUTH EVERY DAY 07/13/22   Mast, [name], NP  Biotin 5 MG TBDP Take 1 tablet (5 mg total) by mouth daily. 06/20/22   Medina-Vargas, Monina C, NP  Cholecalciferol (VITAMIN D3) 125 MCG (5000 UT) CAPS TAKE 1 CAPSULE BY MOUTH EVERY DAY 07/24/22   Mast, [name], NP  colchicine 0.6 MG tablet TAKE 1 TABLET BY MOUTH EVERY DAY 08/28/22    Mast, [name], NP  CRANBERRY PO Take 1 capsule by mouth daily.    [provider]  cycloSPORINE (RESTASIS) 0.05 % ophthalmic emulsion Place 1 drop into both eyes 2 (two) times daily. 06/20/22   Medina-Vargas, Monina C, NP  diclofenac Sodium (VOLTAREN) 1 % GEL APPLY 2 TO 4 GRAMS TOPICALLY TO AFFECTED JOINTS UP TO 4 TIMES DAILY Patient taking differently: Apply 2-4 g topically 4 (four) times daily as needed (pain). APPLY 2 TO 4 GRAMS TOPICALLY TO AFFECTED JOINTS UP TO 4 TIMES DAILY 06/20/22   Medina-Vargas, Monina C, NP  fexofenadine (ALLEGRA) 180 MG tablet Take 1 tablet (180 mg total) by mouth daily. 06/20/22   Medina-Vargas, Monina C, NP  fluconazole (DIFLUCAN) 100 MG tablet Take 1 tablet (100 mg total) by mouth daily. 01/31/23   [name], MD  furosemide (LASIX) 80 MG tablet Take 1 tablet (80 mg total) by mouth daily. 06/20/22   Medina-Vargas, Monina C, NP  lansoprazole (PREVACID) 30 MG capsule TAKE 1 CAPSULE BY MOUTH ONCE DAILY AT NOON Patient taking differently: Take 30 mg by mouth daily at 12 noon. 10/10/22   Mast, [name], NP  Melatonin 10 MG TABS Take 10 mg by mouth at bedtime.    [provider]  montelukast (SINGULAIR) 10 MG tablet TAKE 1 TABLET BY MOUTH EVERYDAY AT BEDTIME Patient taking differently:  Take 10 mg by mouth at bedtime. 08/28/22   Mast, [name], NP  potassium chloride (MICRO-K) 10 MEQ CR capsule Take 2 capsules (20 mEq total) by mouth 2 (two) times daily. 06/20/22   Medina-Vargas, Monina C, NP  predniSONE (DELTASONE) 10 MG tablet TAKE 1 TABLET (10 MG TOTAL) BY MOUTH DAILY WITH BREAKFAST. 11/29/22   Mast, [name], NP  predniSONE (DELTASONE) 10 MG tablet Take 1 tablet (10 mg total) by mouth daily with breakfast. Take 4 tab x 2 days, 3 tab x 2 days 2 tab x 2 days then continue with 1 tab/day 12/27/22   Wardell Honour, MD  predniSONE (DELTASONE) 5 MG tablet Take 4 tabs po qd x 7 days, 3.5  tabs po qd x 7 days, 3  tabs po qd x 7 days. 01/10/23   Ofilia Neas, PA-C   traMADol HCl 100 MG TABS Take 100 mg by mouth every 6 (six) hours as needed. 02/08/23   Mast, [name], NP  vitamin B-12 (CYANOCOBALAMIN) 1000 MCG tablet Take 1 tablet (1,000 mcg total) by mouth daily. 06/20/22   Medina-Vargas, Monina C, NP      Allergies    Adhesive [tape], Celebrex [celecoxib], Ciprofibrate, Cymbalta [duloxetine hcl], Gabitril [tiagabine], Lyrica [pregabalin], Neurontin [gabapentin], Nexium [esomeprazole], Nsaids, Shrimp [shellfish allergy], Azactam [aztreonam], Azelastine hcl, Ciprofloxacin, Claritin [loratadine], Methotrexate derivatives, Nasacort [triamcinolone], Olopatadine, Other, Sulfamethizole, Zantac [ranitidine hcl], Claritin-d 12 hour [loratadine-pseudoephedrine er], Keflex [cephalexin], Penicillins, and Sulfa antibiotics    Review of Systems   Review of Systems  Physical Exam Updated Vital Signs BP (!) 125/57   Pulse 99   Temp (!) 100.7 F (38.2 C) (Rectal)   Resp 15   Ht '5\' 2"'$  (1.575 m)   Wt 81.6 kg   SpO2 92%   BMI 32.90 kg/m  Physical Exam Vitals and nursing note reviewed.  Constitutional:      General: She is not in acute distress.    Appearance: She is well-developed.     Comments: Sleepy but can be aroused by speaking her name.  Quickly dozes off  HENT:     Head: Normocephalic and atraumatic.     Mouth/Throat:     Mouth: Mucous membranes are dry.  Eyes:     Pupils: Pupils are equal, round, and reactive to light.  Cardiovascular:     Rate and Rhythm: Regular rhythm. Tachycardia present.     Heart sounds: Normal heart sounds. No murmur heard.    No friction rub.  Pulmonary:     Effort: Pulmonary effort is normal. Tachypnea present.     Breath sounds: Normal breath sounds. No wheezing or rales.  Abdominal:     General: Bowel sounds are normal. There is no distension.     Palpations: Abdomen is soft.     Tenderness: There is abdominal tenderness in the right upper quadrant and epigastric area. There is no guarding or rebound.  Musculoskeletal:         General: Tenderness present. Normal range of motion.     Comments: No edema.  Small skin avulsions to bilateral knees but full rom of the knees and hips bilaterally without pain.  Palpation and full ROM of the right ankle and foot are normal.  Left foot with erythema and some warmth but no fluctuance or drainage.  Tender to the touch and manipulation.  No red streaking up the leg or to the ankle.  Erythema is only on the foot.  Small skin tear on the left elbow  Skin:    General: Skin is warm and dry.     Findings: No rash.  Neurological:     Mental Status: She is oriented to person, place, and time.     Cranial Nerves: No cranial nerve deficit.  Psychiatric:     Comments: sleepy     ED Results / Procedures / Treatments   Labs (all labs ordered are listed, but only abnormal results are displayed) Labs Reviewed  LACTIC ACID, PLASMA - Abnormal; Notable for the following components:      Result Value   Lactic Acid, Venous 2.3 (*)    All other components within normal limits  LACTIC ACID, PLASMA - Abnormal; Notable for the following components:   Lactic Acid, Venous 2.6 (*)    All other components within normal limits  COMPREHENSIVE METABOLIC PANEL - Abnormal; Notable for the following components:   Glucose, Bld 125 (*)    Creatinine, Ser 1.09 (*)    Total Bilirubin 1.4 (*)    GFR, Estimated 49 (*)    All other components within normal limits  CBC WITH DIFFERENTIAL/PLATELET - Abnormal; Notable for the following components:   WBC 15.7 (*)    Neutro Abs 10.8 (*)    Monocytes Absolute 1.4 (*)    Abs Immature Granulocytes 0.10 (*)    All other components within normal limits  URINALYSIS, W/ REFLEX TO CULTURE (INFECTION SUSPECTED) - Abnormal; Notable for the following components:   Hgb urine dipstick MODERATE (*)    Ketones, ur 20 (*)    Bacteria, UA RARE (*)    All other components within normal limits  RESP PANEL BY RT-PCR (RSV, FLU A&B, COVID)  RVPGX2  CULTURE, BLOOD  (ROUTINE X 2)  CULTURE, BLOOD (ROUTINE X 2)  PROTIME-INR  APTT    EKG None  Radiology CT ABDOMEN PELVIS W CONTRAST  Result Date: 02/12/2023 CLINICAL DATA:  89 year old female status post fall from toilet this morning. Abdominal pain. EXAM: CT ABDOMEN AND PELVIS WITH CONTRAST TECHNIQUE: Multidetector CT imaging of the abdomen and pelvis was performed using the standard protocol following bolus administration of intravenous contrast. RADIATION DOSE REDUCTION: This exam was performed according to the departmental dose-optimization program which includes automated exposure control, adjustment of the mA and/or kV according to patient size and/or use of iterative reconstruction technique. CONTRAST:  143m OMNIPAQUE IOHEXOL 300 MG/ML  SOLN COMPARISON:  CT Abdomen and Pelvis 05/21/2022. FINDINGS: Lower chest: Lung bases are stable from last year with combined mild atelectasis, scarring, and also chronic postinflammatory small lung nodularity. No pericardial effusion, pleural effusion, or acute lung base opacity. Hepatobiliary: Liver and gallbladder appear stable and negative. Pancreas: Chronic pancreatic atrophy. Spleen: Negative. Adrenals/Urinary Tract: Normal adrenal glands. Kidneys are stable and nonobstructed. Small chronic benign appearing renal cysts (no follow-up imaging recommended). Symmetric renal enhancement and contrast excretion. No hydroureter. Unremarkable bladder. Stomach/Bowel: Chronic sigmoid diverticulosis, maximal in the anterior sigmoid and junction with the descending colon. That segment appears stable from the CT last year, no definite active inflammation. Mild large bowel redundancy and retained stool otherwise. Cecum located in the midline today. Elongated but normal appendix tracking to the right (series 2, image 58). No dilated small bowel. Small gastric hiatal hernia or phrenic ampulla (normal variant). Otherwise decompressed stomach and duodenum. No free air or free fluid identified.  Stable chronic rectus muscle diastasis. Vascular/Lymphatic: Aortoiliac calcified atherosclerosis. Normal abdominal aortic caliber. Major arterial structures remain patent. Portal venous system is patent. No lymphadenopathy identified. Reproductive: Stable simple 3.8 cm  left ovarian cyst since at least 2019 (no follow-up imaging recommended). Diminutive or absent uterus. Negative right ovary. Other: No pelvis free fluid. Musculoskeletal: Advanced chronic spine degeneration and previous lumbar interbody fusion. Background osteopenia. 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 No acute osseous abnormality identified. But evidence of a new caudal left lateral recess disc extrusion since last year at L2-L3 (series 2, image 36). IMPRESSION: 1. No acute traumatic injury identified. No acute or inflammatory process identified in the abdomen or pelvis. 2. 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 Query left L3 radiculitis. 3.  Aortic Atherosclerosis (ICD10-I70.0). Electronically Signed   By: Genevie Ann M.D.   On: 02/12/2023 13:04   DG Chest Port 1 View  Result Date: 02/12/2023 CLINICAL DATA:  Questionable sepsis EXAM: PORTABLE CHEST 1 VIEW COMPARISON:  10/30/2022 FINDINGS: Low volume chest. There is no edema, consolidation, effusion, or pneumothorax. Normal heart size and mediastinal contours. Artifact from EKG leads. IMPRESSION: Stable low volume chest.  No acute finding. Electronically Signed   By: Jorje Guild M.D.   On: 02/12/2023 11:08   DG Foot Complete Left  Result Date: 02/12/2023 CLINICAL DATA:  Fall.  Questionable sepsis. EXAM: LEFT FOOT - COMPLETE 3+ VIEW COMPARISON:  10/30/2022 FINDINGS: There is no evidence of fracture or dislocation. There is no evidence of arthropathy or other focal bone abnormality. Soft tissues are unremarkable. IMPRESSION: Negative. Electronically Signed   By: Jorje Guild M.D.   On: 02/12/2023 11:07     Procedures Procedures    Medications Ordered in ED Medications  lactated ringers infusion ( Intravenous New Bag/Given 02/12/23 1131)  metroNIDAZOLE (FLAGYL) IVPB 500 mg (500 mg Intravenous New Bag/Given 02/12/23 1334)  vancomycin (VANCOREADY) IVPB 1500 mg/300 mL (has no administration in time range)  acetaminophen (TYLENOL) tablet 650 mg (has no administration in time range)  lactated ringers bolus 500 mL (0 mLs Intravenous Stopped 02/12/23 1146)  ondansetron (ZOFRAN) injection 4 mg (4 mg Intravenous Given 02/12/23 1130)  ceFEPIme (MAXIPIME) 2 g in sodium chloride 0.9 % 100 mL IVPB (0 g Intravenous Stopped 02/12/23 1337)  iohexol (OMNIPAQUE) 300 MG/ML solution 100 mL (100 mLs Intravenous Contrast Given 02/12/23 1246)    ED Course/ Medical Decision Making/ A&P                             Medical Decision Making Amount and/or Complexity of Data Reviewed Labs: ordered. Decision-making details documented in ED Course. Radiology: ordered and independent interpretation performed. Decision-making details documented in ED Course. ECG/medicine tests: ordered.  Risk OTC drugs. Prescription drug management. Decision regarding hospitalization.   Pt with multiple medical problems and comorbidities and presenting today with a complaint that caries a high risk for morbidity and mortality.  Patient here today after a fall at her home.  Patient does not appear to have hit her head or lost consciousness but concerned that patient may be is displaying early signs of sepsis.  She is hot to the touch, very sleepy even though she will awake to her name being called.  She is complaining of upper abdominal pain but also pain in her left foot which might be related to the fall but also appears mildly red.  Possibility for early cellulitis, cholecystitis, viral etiology, pneumonia, UTI.  Will do a rectal temp, labs and imaging pending.  1:52 PM I independently interpreted patient's labs and patient has a white  count of 15,000 today on CBC, CMP without acute findings within normal anion gap, viral panel is negative, UA without evidence of infection, coags within normal limits, lactic acid mildly elevated 2.3 today.  I have independently visualized and interpreted pt's images today. Chest x-ray without acute findings and foot x-ray without signs of fracture.  Will do a CT of the abdomen pelvis to her to her complaint of abdominal pain.  Patient's rectal temperature was 100.7 and she was made a code sepsis.  She was covered with antibiotics for source unknown.  Given patient's multiple medication allergies spoke with pharmacy before ordering cefepime vancomycin and Flagyl.  Patient will require admission.  1:52 PM CT abdomen pelvis negative for acute findings.  On repeat evaluation to talk to the patient patient's daughter is now present.  She reports since last Wednesday patient has been complaining of her left side hurting.  To the point where she was laying in bed all day over the weekend.  Her daughter said she was not altered and she had been taking pain medication even followed up with her doctor for it but there was no resolution of her symptoms.  Patient has had no recent procedures.  She does have a history of a bad back and last had injections in her back last summer.  No skin findings consistent with zoster.  When reevaluating the foot there is no evidence of worsening redness.  Possibly early gout in her left great toe.  At this time patient will require admission for sepsis of unknown origin.  She and her daughter are comfortable with this plan.  Second lactate is elevated to 2.6 from 2.3.  She is currently getting IV fluids.        Final Clinical Impression(s) / ED Diagnoses Final diagnoses:  Fall, initial encounter  Fever of unknown origin  Sepsis without acute organ dysfunction, due to unspecified organism [facility])    Rx / DC Orders ED Discharge Orders     None         Blanchie Dessert, MD 02/12/23 1352

## 2023-02-12 NOTE — Sepsis Progress Note (Signed)
Notified provider of increase in second lactic acid, asked to consider ordering a third lactic acid

## 2023-02-12 NOTE — ED Notes (Signed)
ED TO INPATIENT HANDOFF REPORT  Name/Age/Gender Eileen Wilkerson 89 y.o. female  Code Status Code Status History     Date Active Date Inactive Code Status Order ID Comments User Context   10/27/2022 1328 11/01/2022 2105 Partial Code [id number]  Reubin Milan, MD ED   05/21/2022 1743 05/30/2022 2057 Partial Code [id number]  Jonnie Finner, DO Inpatient   02/03/2018 0302 02/06/2018 1836 Full Code [id number]  Etta Quill, DO ED   01/18/2018 2111 01/21/2018 1951 Full Code [id number]  Patrecia Pour, MD Inpatient   04/25/2017 1413 04/27/2017 1843 Full Code [id number]  Jonetta Osgood, MD ED   12/28/2015 1855 01/02/2016 2015 Full Code [id number]  Bonnielee Haff, MD Inpatient    Questions for Most Recent Historical Code Status (Order [id number])     Question Answer   In the event of cardiac or respiratory ARREST: Initiate Code Blue, Call Rapid Response Yes   In the event of cardiac or respiratory ARREST: Perform CPR Yes   In the event of cardiac or respiratory ARREST: Perform Intubation/Mechanical Ventilation No   In the event of cardiac or respiratory ARREST: Use NIPPV/BiPAp only if indicated Yes   In the event of cardiac or respiratory ARREST: Administer ACLS medications if indicated Yes   In the event of cardiac or respiratory ARREST: Perform Defibrillation or Cardioversion if indicated Yes            Home/SNF/Other Nursing Home  Chief Complaint SIRS with acute organ dysfunction due to infectious process ([place]) [A41.9, R65.20]  Level of Care/Admitting Diagnosis ED Disposition     ED Disposition  Admit   Condition  --   [place]: [place] [[id number]]  Level of Care: Progressive [102]  Admit to Progressive based on following criteria: MULTISYSTEM THREATS such as stable sepsis, metabolic/electrolyte imbalance with or without encephalopathy that is responding to early treatment.  May [facility] patient to [facility] or [facility] if equivalent  level of care is available:: Yes  Covid Evaluation: Asymptomatic - no recent exposure (last 10 days) testing not required  Diagnosis: SIRS with acute organ dysfunction due to infectious process [id number]  Admitting Physician: Gary Fleet  Attending Physician: AGBATA, TOCHUKWU [id number]  Certification:: I certify this patient will need inpatient services for at least 2 midnights  Estimated Length of Stay: 3          Medical History Past Medical History:  Diagnosis Date   Adrenal failure ([place])    Arthritis    Asthma    Cancer ([place])    Cataract    Closed nondisplaced fracture of fifth right metatarsal bone 09/18/2017   Diverticulitis    Per patient   E coli bacteremia    Fibromyalgia 2008   GERD (gastroesophageal reflux disease) 12/18/2016   HCAP (healthcare-associated pneumonia) 02/03/2018   Hyperlipidemia 10/13/2021   Osteoporosis    RA (rheumatoid arthritis) ([place])    Recurrent upper respiratory infection (URI)    Sepsis due to urinary tract infection ([place]) 01/18/2018   Urticaria     Allergies Allergies  Allergen Reactions   Adhesive [Tape] Rash   Celebrex [Celecoxib] Hives   Ciprofibrate Nausea Only   Cymbalta [Duloxetine Hcl] Swelling   Gabitril [Tiagabine] Swelling   Lyrica [Pregabalin] Swelling   Neurontin [Gabapentin] Swelling   Nexium [Esomeprazole] Rash   Nsaids Rash   Shrimp [Shellfish Allergy] Anaphylaxis    Per patient "shrimp only"   Azactam [Aztreonam]     Hand swelling  Azelastine Hcl     Rash    Ciprofloxacin Other (See Comments)    dizziness   Claritin [Loratadine]     Irritability Nervousness    Methotrexate Derivatives    Nasacort [Triamcinolone]     Dizzy    Olopatadine Other (See Comments)    Pain and lethargy    Other    Sulfamethizole Other (See Comments)    unknown   Zantac [Ranitidine Hcl] Other (See Comments)    unknown   Claritin-D 12 Hour [Loratadine-Pseudoephedrine Er] Anxiety   Keflex [Cephalexin]  Nausea And Vomiting    Tolerated Ancef   Penicillins Rash    Injection site reaction. Tolerated cefepime in past. Also reports tolerating a penicillin infusion after this initial rxn ~20 yrs ago.  Has patient had a PCN reaction causing immediate rash, facial/tongue/throat swelling, SOB or lightheadedness with hypotension: No Has patient had a PCN reaction causing severe rash involving mucus membranes or skin necrosis: No Has patient had a PCN reaction that required hospitalization No Has patient had a PCN reaction occurring within the last 10 years: No     Sulfa Antibiotics Nausea And Vomiting    IV Location/Drains/Wounds Patient Lines/Drains/Airways Status     Active Line/Drains/Airways     Name Placement date Placement time Site Days   Peripheral IV 02/12/23 20 G Anterior;Right Forearm 02/12/23  1128  Forearm  less than 1   External Urinary Catheter 10/31/22  0646  --  104            Labs/Imaging Results for orders placed or performed during the hospital encounter of 02/12/23 (from the past 48 hour(s))  Lactic acid, plasma     Status: Abnormal   Collection Time: 02/12/23 10:46 AM  Result Value Ref Range   Lactic Acid, Venous 2.3 (HH) 0.5 - 1.9 mmol/L    Comment: CRITICAL RESULT CALLED TO, READ BACK BY AND VERIFIED WITH RN P DUNLAP AT 1216 02/12/23 CRUICKSHANK A Performed at [facility], [place] [address] [place], [place] [address]   Comprehensive metabolic panel     Status: Abnormal   Collection Time: 02/12/23 11:20 AM  Result Value Ref Range   Sodium 139 135 - 145 mmol/L   Potassium 4.1 3.5 - 5.1 mmol/L   Chloride 99 98 - 111 mmol/L   CO2 27 22 - 32 mmol/L   Glucose, Bld 125 (H) 70 - 99 mg/dL    Comment: Glucose reference range applies only to samples taken after fasting for at least 8 hours.   BUN 17 8 - 23 mg/dL   Creatinine, Ser 1.09 (H) 0.44 - 1.00 mg/dL   Calcium 9.4 8.9 - 10.3 mg/dL   Total Protein 7.9 6.5 - 8.1 g/dL   Albumin 3.8 3.5 - 5.0  g/dL   AST 26 15 - 41 U/L   ALT 15 0 - 44 U/L   Alkaline Phosphatase 61 38 - 126 U/L   Total Bilirubin 1.4 (H) 0.3 - 1.2 mg/dL   GFR, Estimated 49 (L) >60 mL/min    Comment: (NOTE) Calculated using the CKD-EPI Creatinine Equation (2021)    Anion gap 13 5 - 15    Comment: Performed at [facility], [place] [address] [place], [place] [address]  CBC with Differential     Status: Abnormal   Collection Time: 02/12/23 11:20 AM  Result Value Ref Range   WBC 15.7 (H) 4.0 - 10.5 K/uL   RBC 4.63 3.87 - 5.11 MIL/uL   Hemoglobin  13.7 12.0 - 15.0 g/dL   HCT 44.7 36.0 - 46.0 %   MCV 96.5 80.0 - 100.0 fL   MCH 29.6 26.0 - 34.0 pg   MCHC 30.6 30.0 - 36.0 g/dL   RDW 15.2 11.5 - 15.5 %   Platelets 278 150 - 400 K/uL   nRBC 0.0 0.0 - 0.2 %   Neutrophils Relative % 68 %   Neutro Abs 10.8 (H) 1.7 - 7.7 K/uL   Lymphocytes Relative 20 %   Lymphs Abs 3.2 0.7 - 4.0 K/uL   Monocytes Relative 9 %   Monocytes Absolute 1.4 (H) 0.1 - 1.0 K/uL   Eosinophils Relative 1 %   Eosinophils Absolute 0.2 0.0 - 0.5 K/uL   Basophils Relative 1 %   Basophils Absolute 0.1 0.0 - 0.1 K/uL   Immature Granulocytes 1 %   Abs Immature Granulocytes 0.10 (H) 0.00 - 0.07 K/uL    Comment: Performed at [facility], [place] [address], [place], [place] [address]  Protime-INR     Status: None   Collection Time: 02/12/23 11:20 AM  Result Value Ref Range   Prothrombin Time 13.9 11.4 - 15.2 seconds   INR 1.1 0.8 - 1.2    Comment: (NOTE) INR goal varies based on device and disease states. Performed at [facility], [place] [address] [place], [place] [address]   APTT     Status: None   Collection Time: 02/12/23 11:20 AM  Result Value Ref Range   aPTT 26 24 - 36 seconds    Comment: Performed at [facility], [place] [address], [place], [place] [address]  Urinalysis, w/ Reflex to Culture (Infection Suspected) -Urine, Clean Catch     Status: Abnormal    Collection Time: 02/12/23 11:21 AM  Result Value Ref Range   Specimen Source URINE, CLEAN CATCH    Color, Urine YELLOW YELLOW   APPearance CLEAR CLEAR   Specific Gravity, Urine 1.017 1.005 - 1.030   pH 6.0 5.0 - 8.0   Glucose, UA NEGATIVE NEGATIVE mg/dL   Hgb urine dipstick MODERATE (A) NEGATIVE   Bilirubin Urine NEGATIVE NEGATIVE   Ketones, ur 20 (A) NEGATIVE mg/dL   Protein, ur NEGATIVE NEGATIVE mg/dL   Nitrite NEGATIVE NEGATIVE   Leukocytes,Ua NEGATIVE NEGATIVE   RBC / HPF 21-50 0 - 5 RBC/hpf   WBC, UA 0-5 0 - 5 WBC/hpf    Comment:        Reflex urine culture not performed if WBC <=10, OR if Squamous epithelial cells >5. If Squamous epithelial cells >5 suggest recollection.    Bacteria, UA RARE (A) NONE SEEN   Squamous Epithelial / HPF 0-5 0 - 5 /HPF   Mucus PRESENT     Comment: Performed at [facility], [place] [address], [place], [place] [address]  Resp panel by RT-PCR (RSV, Flu A&B, Covid) Peripheral     Status: None   Collection Time: 02/12/23 11:21 AM   Specimen: Peripheral; Nasal Swab  Result Value Ref Range   SARS Coronavirus 2 by RT PCR NEGATIVE NEGATIVE    Comment: (NOTE) SARS-CoV-2 target nucleic acids are NOT DETECTED.  The SARS-CoV-2 RNA is generally detectable in upper respiratory specimens during the acute phase of infection. The lowest concentration of SARS-CoV-2 viral copies this assay can detect is 138 copies/mL. A negative result does not preclude SARS-Cov-2 infection and should not be used as the sole basis for treatment or other patient management decisions. A negative result  may occur with  improper specimen collection/handling, submission of specimen other than nasopharyngeal swab, presence of viral mutation(s) within the areas targeted by this assay, and inadequate number of viral copies(<138 copies/mL). A negative result must be combined with clinical observations, patient history, and epidemiological information. The expected  result is Negative.  Fact Sheet for Patients:  EntrepreneurPulse.com.au  Fact Sheet for Healthcare Providers:  IncredibleEmployment.be  This test is no t yet approved or cleared by the [place] FDA and  has been authorized for detection and/or diagnosis of SARS-CoV-2 by FDA under an Emergency Use Authorization (EUA). This EUA will remain  in effect (meaning this test can be used) for the duration of the COVID-19 declaration under Section 564(b)(1) of the Act, 21 U.S.C.section 360bbb-3(b)(1), unless the authorization is terminated  or revoked sooner.       Influenza A by PCR NEGATIVE NEGATIVE   Influenza B by PCR NEGATIVE NEGATIVE    Comment: (NOTE) The Xpert Xpress SARS-CoV-2/FLU/RSV plus assay is intended as an aid in the diagnosis of influenza from Nasopharyngeal swab specimens and should not be used as a sole basis for treatment. Nasal washings and aspirates are unacceptable for Xpert Xpress SARS-CoV-2/FLU/RSV testing.  Fact Sheet for Patients: EntrepreneurPulse.com.au  Fact Sheet for Healthcare Providers: IncredibleEmployment.be  This test is not yet approved or cleared by the [place] FDA and has been authorized for detection and/or diagnosis of SARS-CoV-2 by FDA under an Emergency Use Authorization (EUA). This EUA will remain in effect (meaning this test can be used) for the duration of the COVID-19 declaration under Section 564(b)(1) of the Act, 21 U.S.C. section 360bbb-3(b)(1), unless the authorization is terminated or revoked.     Resp Syncytial Virus by PCR NEGATIVE NEGATIVE    Comment: (NOTE) Fact Sheet for Patients: EntrepreneurPulse.com.au  Fact Sheet for Healthcare Providers: IncredibleEmployment.be  This test is not yet approved or cleared by the [place] FDA and has been authorized for detection and/or diagnosis of SARS-CoV-2  by FDA under an Emergency Use Authorization (EUA). This EUA will remain in effect (meaning this test can be used) for the duration of the COVID-19 declaration under Section 564(b)(1) of the Act, 21 U.S.C. section 360bbb-3(b)(1), unless the authorization is terminated or revoked.  Performed at [facility], [place] [address], [place], [place] [address]   Lactic acid, plasma     Status: Abnormal   Collection Time: 02/12/23  1:07 PM  Result Value Ref Range   Lactic Acid, Venous 2.6 (HH) 0.5 - 1.9 mmol/L    Comment: CRITICAL VALUE NOTED. VALUE IS CONSISTENT WITH PREVIOUSLY REPORTED/CALLED VALUE Performed at [place] [address], [place], [place] [address]    CT ABDOMEN PELVIS W CONTRAST  Result Date: 02/12/2023 CLINICAL DATA:  89 year old female status post fall from toilet this morning. Abdominal pain. EXAM: CT ABDOMEN AND PELVIS WITH CONTRAST TECHNIQUE: Multidetector CT imaging of the abdomen and pelvis was performed using the standard protocol following bolus administration of intravenous contrast. RADIATION DOSE REDUCTION: This exam was performed according to the departmental dose-optimization program which includes automated exposure control, adjustment of the mA and/or kV according to patient size and/or use of iterative reconstruction technique. CONTRAST:  160m OMNIPAQUE IOHEXOL 300 MG/ML  SOLN COMPARISON:  CT Abdomen and Pelvis 05/21/2022. FINDINGS: Lower chest: Lung bases are stable from last year with combined mild atelectasis, scarring, and also chronic postinflammatory small lung nodularity. No pericardial effusion, pleural effusion, or acute lung base opacity. Hepatobiliary: Liver and gallbladder  appear stable and negative. Pancreas: Chronic pancreatic atrophy. Spleen: Negative. Adrenals/Urinary Tract: Normal adrenal glands. Kidneys are stable and nonobstructed. Small chronic benign appearing renal cysts (no follow-up imaging recommended).  Symmetric renal enhancement and contrast excretion. No hydroureter. Unremarkable bladder. Stomach/Bowel: Chronic sigmoid diverticulosis, maximal in the anterior sigmoid and junction with the descending colon. That segment appears stable from the CT last year, no definite active inflammation. Mild large bowel redundancy and retained stool otherwise. Cecum located in the midline today. Elongated but normal appendix tracking to the right (series 2, image 58). No dilated small bowel. Small gastric hiatal hernia or phrenic ampulla (normal variant). Otherwise decompressed stomach and duodenum. No free air or free fluid identified. Stable chronic rectus muscle diastasis. Vascular/Lymphatic: Aortoiliac calcified atherosclerosis. Normal abdominal aortic caliber. Major arterial structures remain patent. Portal venous system is patent. No lymphadenopathy identified. Reproductive: Stable simple 3.8 cm left ovarian cyst since at least 2019 (no follow-up imaging recommended). Diminutive or absent uterus. Negative right ovary. Other: No pelvis free fluid. Musculoskeletal: Advanced chronic spine degeneration and previous lumbar interbody fusion. Background osteopenia. 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 No acute osseous abnormality identified. But evidence of a new caudal left lateral recess disc extrusion since last year at L2-L3 (series 2, image 36). IMPRESSION: 1. No acute traumatic injury identified. No acute or inflammatory process identified in the abdomen or pelvis. 2. 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 Query left L3 radiculitis. 3.  Aortic Atherosclerosis (ICD10-I70.0). Electronically Signed   By: Genevie Ann M.D.   On: 02/12/2023 13:04   DG Chest Port 1 View  Result Date: 02/12/2023 CLINICAL DATA:  Questionable sepsis EXAM: PORTABLE CHEST 1 VIEW COMPARISON:  10/30/2022 FINDINGS: Low volume chest. There is no edema, consolidation,  effusion, or pneumothorax. Normal heart size and mediastinal contours. Artifact from EKG leads. IMPRESSION: Stable low volume chest.  No acute finding. Electronically Signed   By: Jorje Guild M.D.   On: 02/12/2023 11:08   DG Foot Complete Left  Result Date: 02/12/2023 CLINICAL DATA:  Fall.  Questionable sepsis. EXAM: LEFT FOOT - COMPLETE 3+ VIEW COMPARISON:  10/30/2022 FINDINGS: There is no evidence of fracture or dislocation. There is no evidence of arthropathy or other focal bone abnormality. Soft tissues are unremarkable. IMPRESSION: Negative. Electronically Signed   By: Jorje Guild M.D.   On: 02/12/2023 11:07    Pending Labs Unresulted Labs (From admission, onward)     Start     Ordered   02/12/23 1046  Blood Culture (routine x 2)  (Undifferentiated presentation (screening labs and basic nursing orders))  BLOOD CULTURE X 2,   STAT      02/12/23 1046            Vitals/Pain Today's Vitals   02/12/23 1230 02/12/23 1330 02/12/23 1400 02/12/23 1430  BP: 118/62 (!) 125/57 (!) 113/58 127/70  Pulse: 96 99 99 98  Resp: '20 15 15 20  '$ Temp:      TempSrc:      SpO2: 95% 92% 93% 93%  Weight:      Height:      PainSc:        Isolation Precautions No active isolations  Medications Medications  lactated ringers infusion ( Intravenous New Bag/Given 02/12/23 1131)  vancomycin (VANCOREADY) IVPB 1500 mg/300 mL (1,500 mg Intravenous New Bag/Given 02/12/23 1433)  lactated ringers bolus 500 mL (0 mLs Intravenous Stopped 02/12/23 1146)  ondansetron (ZOFRAN) injection  4 mg (4 mg Intravenous Given 02/12/23 1130)  ceFEPIme (MAXIPIME) 2 g in sodium chloride 0.9 % 100 mL IVPB (0 g Intravenous Stopped 02/12/23 1337)  metroNIDAZOLE (FLAGYL) IVPB 500 mg (500 mg Intravenous New Bag/Given 02/12/23 1334)  iohexol (OMNIPAQUE) 300 MG/ML solution 100 mL (100 mLs Intravenous Contrast Given 02/12/23 1246)  acetaminophen (TYLENOL) tablet 650 mg (650 mg Oral Given 02/12/23 1432)    Mobility walks with device

## 2023-02-12 NOTE — Progress Notes (Signed)
Pharmacy Antibiotic Note  Eileen Wilkerson is a 89 y.o. female admitted on 02/12/2023 with sepsis.  Pharmacy has been consulted for vanc/cefepime dosing.  Plan: Vanc '1500mg'$  x 1 already given, start 1g IV q48 thereafter goal AUC 400-550 Cefepime 2g IV q12 per current renal function  Height: '5\' 2"'$  (157.5 cm) Weight: 81.6 kg (179 lb 14.3 oz) IBW/kg (Calculated) : 50.1  Temp (24hrs), Avg:99 F (37.2 C), Min:97.7 F (36.5 C), Max:100.7 F (38.2 C)  Recent Labs  Lab 02/12/23 1046 02/12/23 1120 02/12/23 1307  WBC  --  15.7*  --   CREATININE  --  1.09*  --   LATICACIDVEN 2.3*  --  2.6*    Estimated Creatinine Clearance: 35.3 mL/min (A) (by C-G formula based on SCr of 1.09 mg/dL (H)).    Allergies  Allergen Reactions   Adhesive [Tape] Rash   Celebrex [Celecoxib] Hives   Ciprofibrate Nausea Only   Cymbalta [Duloxetine Hcl] Swelling   Gabitril [Tiagabine] Swelling   Lyrica [Pregabalin] Swelling   Neurontin [Gabapentin] Swelling   Nexium [Esomeprazole] Rash   Nsaids Rash   Shrimp [Shellfish Allergy] Anaphylaxis    Per patient "shrimp only"   Azactam [Aztreonam]     Hand swelling    Azelastine Hcl     Rash    Ciprofloxacin Other (See Comments)    dizziness   Claritin [Loratadine]     Irritability Nervousness    Methotrexate Derivatives    Nasacort [Triamcinolone]     Dizzy    Olopatadine Other (See Comments)    Pain and lethargy    Other    Sulfamethizole Other (See Comments)    unknown   Zantac [Ranitidine Hcl] Other (See Comments)    unknown   Claritin-D 12 Hour [Loratadine-Pseudoephedrine Er] Anxiety   Keflex [Cephalexin] Nausea And Vomiting    Tolerated Ancef   Penicillins Rash    Injection site reaction. Tolerated cefepime in past. Also reports tolerating a penicillin infusion after this initial rxn ~20 yrs ago.  Has patient had a PCN reaction causing immediate rash, facial/tongue/throat swelling, SOB or lightheadedness with hypotension: No Has patient had a  PCN reaction causing severe rash involving mucus membranes or skin necrosis: No Has patient had a PCN reaction that required hospitalization No Has patient had a PCN reaction occurring within the last 10 years: No     Sulfa Antibiotics Nausea And Vomiting    Thank you for allowing pharmacy to be a part of this patient's care.  Kara Mead 02/12/2023 5:02 PM

## 2023-02-12 NOTE — Group Note (Unsigned)
Date:  02/12/2023 Time:  10:23 AM  Group Topic/Focus:  Goals Group:   The focus of this group is to help patients establish daily goals to achieve during treatment and discuss how the patient can incorporate goal setting into their daily lives to aide in recovery. Orientation:   The focus of this group is to educate the patient on the purpose and policies of crisis stabilization and provide a format to answer questions about their admission.  The group details unit policies and expectations of patients while admitted.     Participation Level:  {BHH PARTICIPATION WO:[id number]  Participation Quality:  {BHH PARTICIPATION QUALITY:22265}  Affect:  {BHH AFFECT:22266}  Cognitive:  {BHH COGNITIVE:22267}  Insight: {BHH Insight2:20797}  Engagement in Group:  {BHH ENGAGEMENT IN BP:[id number]  Modes of Intervention:  {BHH MODES OF INTERVENTION:22269}  Additional Comments:  ***  Dub Mikes 02/12/2023, 10:23 AM

## 2023-02-12 NOTE — Assessment & Plan Note (Signed)
Patient with complaints of left hip pain and inability to bear weight on her left leg for over 2 weeks. No recent trauma or fall. Patient had a CT scan of abdomen and pelvis which showed chronic spine degeneration with new caudal left lateral recess disc herniation at L2-L3 since last year. Query left L3 radiculitis.   Requested neurosurgery consult for further evaluation.  They want an MRI of the lumbar spine prior to seeing patient

## 2023-02-12 NOTE — Assessment & Plan Note (Signed)
Patient presents to the ER for evaluation after a fall and was noted to have a fever with a Tmax of 100.7, she was also tachycardic and has an uptrending lactic acid level with a leukocytosis of 15.7 Unclear source of sepsis at this time but I am concerned for possible diverticulitis given her history of diverticulosis and abdominal tenderness on exam Continue aggressive IV fluid resuscitation Trend lactic acid levels Follow-up results of blood cultures Will treat patient empirically with Zosyn and vancomycin

## 2023-02-12 NOTE — Assessment & Plan Note (Addendum)
Hold furosemide due to dehydration from poor oral intake Last known LVEF was 60 to 65% with mild LVH and grade 2 diastolic dysfunction from a 2D echocardiogram which was done 09/07/22

## 2023-02-12 NOTE — Assessment & Plan Note (Signed)
Patient noted to have very dry mucous membranes and poor skin turgor Baseline creatinine is 0.94 and on admission today it is 1.09 AKI appears to be secondary to poor oral intake. Hold furosemide Continue IV fluid hydration Repeat renal parameters in a.m.

## 2023-02-12 NOTE — ED Triage Notes (Addendum)
From [facility], fell forward this morning while using the toilet while trying to wipe herself. Fell onto her knees, has small abrasions on bilateral knees, left elbow. C/o ankle pain. Uses walker at baseline. On elaquis, no head injury. Pt drowsy but arousable, did not take any pain medications en route.

## 2023-02-12 NOTE — Assessment & Plan Note (Addendum)
Continue stress dose steroids Switch patient back to oral prednisone once acute illness improves or resolves.

## 2023-02-13 DIAGNOSIS — G9341 Metabolic encephalopathy: Secondary | ICD-10-CM | POA: Diagnosis not present

## 2023-02-13 DIAGNOSIS — R6521 Severe sepsis with septic shock: Secondary | ICD-10-CM

## 2023-02-13 DIAGNOSIS — A4151 Sepsis due to Escherichia coli [E. coli]: Secondary | ICD-10-CM

## 2023-02-13 LAB — BASIC METABOLIC PANEL
Anion gap: 11 (ref 5–15)
BUN: 14 mg/dL (ref 8–23)
CO2: 25 mmol/L (ref 22–32)
Calcium: 9.2 mg/dL (ref 8.9–10.3)
Chloride: 104 mmol/L (ref 98–111)
Creatinine, Ser: 0.88 mg/dL (ref 0.44–1.00)
GFR, Estimated: >60 mL/min (ref >60)
Glucose, Bld: 104 mg/dL — ABNORMAL HIGH (ref 70–99)
Potassium: 3.4 mmol/L — ABNORMAL LOW (ref 3.5–5.1)
Sodium: 140 mmol/L (ref 135–145)

## 2023-02-13 LAB — CBC
HCT: 38.5 % (ref 36.0–46.0)
Hemoglobin: 11.4 g/dL — ABNORMAL LOW (ref 12.0–15.0)
MCH: 28.8 pg (ref 26.0–34.0)
MCHC: 29.6 g/dL — ABNORMAL LOW (ref 30.0–36.0)
MCV: 97.2 fL (ref 80.0–100.0)
Platelets: 250 10*3/uL (ref 150–400)
RBC: 3.96 MIL/uL (ref 3.87–5.11)
RDW: 15.2 % (ref 11.5–15.5)
WBC: 12.4 10*3/uL — ABNORMAL HIGH (ref 4.0–10.5)
nRBC: 0 % (ref 0.0–0.2)

## 2023-02-13 LAB — MRSA NEXT GEN BY PCR, NASAL: MRSA by PCR Next Gen: NOT DETECTED

## 2023-02-13 MED ORDER — TRAMADOL HCL 50 MG PO TABS
50.0000 mg | ORAL_TABLET | Freq: Four times a day (QID) | ORAL | Status: DC | PRN
  Administered 2023-02-14 – 2023-02-16 (×6): 50 mg via ORAL
  Filled 2023-02-13 (×7): qty 1

## 2023-02-13 MED ORDER — DEXAMETHASONE SODIUM PHOSPHATE 10 MG/ML IJ SOLN
6.0000 mg | Freq: Two times a day (BID) | INTRAMUSCULAR | Status: DC
  Administered 2023-02-13 – 2023-02-15 (×4): 6 mg via INTRAVENOUS
  Filled 2023-02-13 (×4): qty 1

## 2023-02-13 MED ORDER — POTASSIUM CHLORIDE CRYS ER 20 MEQ PO TBCR
20.0000 meq | EXTENDED_RELEASE_TABLET | Freq: Once | ORAL | Status: AC
  Administered 2023-02-13: 20 meq via ORAL
  Filled 2023-02-13: qty 1

## 2023-02-13 NOTE — Progress Notes (Signed)
[facility] PROGRESS NOTE  Eileen Wilkerson (DOB: Dec 17, 1931) [id number] PCP: Mast, [name], NP  Brief Narrative: Eileen Wilkerson is an 89 y.o. female with medical history significant for rheumatoid arthritis on prednisone, GERD, history of gout, history of adrenal insufficiency who presents to the emergency room from friends home after she fell on the morning of her admission while trying to wipe herself after using the bathroom. Her daughter reports she had fallen 2 weeks prior and complained of left lower abdominal pain/hip pain and progressive left leg weakness since that time. In the ED she was febrile (100.30F) and tachycardia to 99bpm, lactic acid elevation (2.3 > 2.6), and leukocytosis (WBC 15k). IV fluids and antibiotics started for concern of sepsis with unclear source.    Subjective: Pt feels ok. She does have some chronic pain for which she takes tramadol. Her daughter has concern she may take too much. Has gotten steroid injections in the past, is a patient at [facility]. Her daughter points out that she's had multiple admissions for sepsis in the past and this time she doesn't really seem as sick as those times. Her mental status is not as impaired as it was those times.   Objective: BP 121/68 (BP Location: Right Arm)   Pulse 98   Temp 98.4 F (36.9 C) (Oral)   Resp 15   Ht '5\' 2"'$  (1.575 m)   Wt 81.6 kg   SpO2 96%   BMI 32.90 kg/m   Gen: Elderly female in no distress Pulm: Clear, nonlabored  CV: RRR, no MRG, +edema GI: Soft, +suprapubic tenderness without distention or guarding. Neuro: Alert and oriented, HOH. No new focal deficits. 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  - Given her immunosuppressed status, we will continue IV antibiotics at least until cultures have resulted at 48 hours. Last MRSA screen negative, no current suspicion for cutaneous or pulmonary source. Will stop vancomycin for now.   Acute metabolic encephalopathy: Probably secondary to acute illness, possibly tramadol contributing. CT head without acute findings - Delirium precautions.  - Decrease dose of tramadol to maximum daily dose '200mg'$  based on age/renal function.   L2-3 disc herniation: Left hip pain, worsening for 2 weeks progressive since a fall, describes also left leg weakness and on exam has related weakness compared to right. - MRI completed, neurosurgery consultation appreciated. Change stress steroids to decadron for now, PT/OT consulted. Plan to follow up as outpatient with consideration for epidural steroid injection.   AKI: Improved.  - Holding further IVF and lasix for today. May restart home lasix 3/6. - Check PVR given suprapubic tenderness on exam.  Chronic HFpEF: Last known LVEF was 60 to 65% with mild LVH and grade 2 diastolic dysfunction [id number]. Appears roughly euvolemic.  - Monitor closely. Can likely restart home lasix 3/6.    Long term current use of systemic steroids, for RA:  - Decadron ordered per neurosurgery recommendations. Will need taper back to home prednisone.    Rheumatoid arthritis: Previously reportedly on MTX, not currently. -  Follow up with Dr. Estanislado Pandy after discharge.   Hypokalemia: Supplemented, will monitor.   Patrecia Pour, MD [facility] [email or web address] 02/13/2023, 6:40 PM

## 2023-02-13 NOTE — Plan of Care (Signed)
  Problem: Education: Goal: Knowledge of General Education information will improve Description: Including pain rating scale, medication(s)/side effects and non-pharmacologic comfort measures Outcome: Progressing   Problem: Activity: Goal: Risk for activity intolerance will decrease Outcome: Progressing   Problem: Nutrition: Goal: Adequate nutrition will be maintained Outcome: Progressing   Problem: Coping: Goal: Level of anxiety will decrease Outcome: Progressing   Problem: Elimination: Goal: Will not experience complications related to urinary retention Outcome: Progressing   Problem: Pain Managment: Goal: General experience of comfort will improve Outcome: Progressing   Problem: Safety: Goal: Ability to remain free from injury will improve Outcome: Progressing   Problem: Skin Integrity: Goal: Risk for impaired skin integrity will decrease Outcome: Progressing   Problem: Respiratory: Goal: Ability to maintain adequate ventilation will improve Outcome: Progressing

## 2023-02-13 NOTE — Evaluation (Signed)
Physical Therapy Evaluation Patient Details Name: Eileen Wilkerson MRN: [id number] DOB: [birth year] Today's Date: 02/13/2023  History of Present Illness  89 yo female admitted with sepsis. Also found to have L L2-L3 disc extrusion. Neurology consulted-conservative management for now since pt has sepsis. Hx of RA, OA, fibromyalgia, osteoporosis, recurrent UTI, asthma, diverticulitis, adrenal failure  Clinical Impression  On eval, pt required Min A for mobility. She walked ~15 feet with a RW. Pt presents with general weakness, decreased activity tolerance, and impaired gait and balance. She is at risk for falls-reports fall prior to admission. Pain rated 7/10-pain in feet and in L lower abd/groin. Discussed d/c plan-pt lives in [place] at [facility] Unfortunately, I do not feel she could manage safely at home alone at this time. PT recommendation is for [facility] SN[facility] rehab at [facility]. Will plan to follow and progress activity as tolerated.         Recommendations for follow up therapy are one component of a multi-disciplinary discharge planning process, led by the attending physician.  Recommendations may be updated based on patient status, additional functional criteria and insurance authorization.  Follow Up Recommendations Skilled nursing-short term rehab (<3 hours/day) Can patient physically be transported by private vehicle: Yes    Assistance Recommended at Discharge Frequent or constant Supervision/Assistance  Patient can return home with the following  A lot of help with walking and/or transfers;Assistance with cooking/housework;Assist for transportation;Help with stairs or ramp for entrance;A little help with bathing/dressing/bathroom    Equipment Recommendations None recommended by PT  Recommendations for Other Services  OT consult    Functional Status Assessment Patient has had a recent decline in their functional status and demonstrates the ability to make significant  improvements in function in a reasonable and predictable amount of time.     Precautions / Restrictions Precautions Precautions: Fall Restrictions Weight Bearing Restrictions: No      Mobility  Bed Mobility Overal bed mobility: Needs Assistance Bed Mobility: Supine to Sit     Supine to sit: Min guard, HOB elevated     General bed mobility comments: Min guard and increased time. Pt relied on bedrail. Cues provided as needed.    Transfers Overall transfer level: Needs assistance Equipment used: Rolling walker (2 wheels) Transfers: Sit to/from Stand Sit to Stand: From elevated surface, Min assist           General transfer comment: Assist to power up, stabilize, control descent. Cues for safety, hand placement. Increased time.    Ambulation/Gait Ambulation/Gait assistance: Min assist Gait Distance (Feet): 15 Feet Assistive device: Rolling walker (2 wheels) Gait Pattern/deviations: Decreased step length - right, Decreased step length - left, Antalgic       General Gait Details: Gait was a bit antalgic due to pt report of pain in bil feet-reports history of gout. Cues for safety. Assist to stabilize throughout short distance. Fatigues easily. Fall risk.  Stairs            Wheelchair Mobility    Modified Rankin (Stroke Patients Only)       Balance Overall balance assessment: Needs assistance, History of Falls         Standing balance support: Bilateral upper extremity supported, During functional activity, Reliant on assistive device for balance Standing balance-Leahy Scale: Poor                               Pertinent Vitals/Pain Pain Assessment Pain  Assessment: 0-10 Pain Score: 7  Pain Location: bil feet, L lower abd/groin area, hands Pain Descriptors / Indicators: Aching Pain Intervention(s): Limited activity within patient's tolerance, Monitored during session, Repositioned    Home Living Family/patient expects to be discharged  to:: Private residence Living Arrangements: Alone Available Help at Discharge: Family;Personal care attendant;Available PRN/intermittently Type of Home: Independent living facility ([facility]) Home Access: Level entry       Home Layout: One level Home Equipment: Rollator (4 wheels);Wheelchair - manual      Prior Function Prior Level of Function : Needs assist             Mobility Comments: uses rollator for household distances, wheelchair for longer distances ADLs Comments: performs ADLs unassisted. help for homemaking, meals, transportation     Hand Dominance   Dominant Hand: Right    Extremity/Trunk Assessment   Upper Extremity Assessment Upper Extremity Assessment: Defer to OT evaluation    Lower Extremity Assessment Lower Extremity Assessment: Generalized weakness    Cervical / Trunk Assessment Cervical / Trunk Assessment: Normal  Communication   Communication: HOH  Cognition Arousal/Alertness: Awake/alert Behavior During Therapy: WFL for tasks assessed/performed Overall Cognitive Status: Within Functional Limits for tasks assessed                                          General Comments      Exercises     Assessment/Plan    PT Assessment Patient needs continued PT services  PT Problem List Decreased strength;Decreased balance;Decreased range of motion;Decreased activity tolerance;Decreased mobility;Pain;Decreased knowledge of use of DME       PT Treatment Interventions DME instruction;Gait training;Functional mobility training;Therapeutic activities;Balance training;Patient/family education;Stair training;Therapeutic exercise    PT Goals (Current goals can be found in the Care Plan section)  Acute Rehab PT Goals Patient Stated Goal: less pain. regain independence PT Goal Formulation: With patient Time For Goal Achievement: 02/27/23 Potential to Achieve Goals: Good    Frequency Min 2X/week     Co-evaluation                AM-PAC PT "6 Clicks" Mobility  Outcome Measure Help needed turning from your back to your side while in a flat bed without using bedrails?: A Little Help needed moving from lying on your back to sitting on the side of a flat bed without using bedrails?: A Little Help needed moving to and from a bed to a chair (including a wheelchair)?: A Little Help needed standing up from a chair using your arms (e.g., wheelchair or bedside chair)?: A Little Help needed to walk in hospital room?: A Lot Help needed climbing 3-5 steps with a railing? : Total 6 Click Score: 15    End of Session Equipment Utilized During Treatment: Gait belt Activity Tolerance: Patient limited by fatigue;Patient limited by pain Patient left: in chair;with call bell/phone within reach;with chair alarm set Nurse Communication: Mobility status PT Visit Diagnosis: Pain;Muscle weakness (generalized) (M62.81);Difficulty in walking, not elsewhere classified (R26.2) Pain - part of body: Ankle and joints of foot    Time: [id number] PT Time Calculation (min) (ACUTE ONLY): 27 min   Charges:   PT Evaluation $PT Eval Low Complexity: 1 Low PT Treatments $Gait Training: 8-22 mins          Doreatha Massed, PT Acute Rehabilitation  Office: [phone]

## 2023-02-13 NOTE — Consult Note (Signed)
Reason for Consult: Referring Physician:  LENEA Wilkerson is an 89 y.o. female.  HPI:   Eileen Wilkerson is an 89 year old female who has been a longstanding patient of mine who presented after a fall and difficulty ambulating.  Patient had been having left-sided back and left hip pain and felt that her hip gave way under the precipitated   Her fall prior to admission.  On admission she was noted to be confused possibly related to taking too many of the pain medication per her daughter patient is also noted to be febrile and the question of sepsis was aroused.  Currently the patient is much better much more awake and alert appropriate not confused not complaining of any pain.  But she is also not mobilized.  She did get a CT and MRI scan.  Past Medical History:  Diagnosis Date   Adrenal failure ([place])    Arthritis    Asthma    Cancer ([employer])    Cataract    Closed nondisplaced fracture of fifth right metatarsal bone 09/18/2017   Diverticulitis    Per patient   E coli bacteremia    Fibromyalgia 2008   GERD (gastroesophageal reflux disease) 12/18/2016   HCAP (healthcare-associated pneumonia) 02/03/2018   Hyperlipidemia 10/13/2021   Osteoporosis    RA (rheumatoid arthritis) ([place])    Recurrent upper respiratory infection (URI)    Sepsis due to urinary tract infection ([place]) 01/18/2018   Urticaria     Past Surgical History:  Procedure Laterality Date   BACK SURGERY     BILATERAL CARPAL TUNNEL RELEASE  2005   right and left   CATARACT EXTRACTION, BILATERAL  2004   right and left   CERVICAL FUSION  2011,2010,2008   2 disks   HEEL SPUR SURGERY  2004   lower back fusion  2011   Fusion of 3-4 and 4-5 lower back   RADIOFREQUENCY ABLATION  2020   ROTATOR CUFF REPAIR  [id number]   SQUAMOUS CELL CARCINOMA EXCISION     TONSILLECTOMY AND ADENOIDECTOMY  1947   TOTAL SHOULDER ARTHROPLASTY      Family History  Problem Relation Age of Onset   Heart attack Maternal Grandmother    Heart attack  Paternal Grandfather    Breast cancer Mother 78   Diabetes Father    Heart disease Father    Congestive Heart Failure Father 67       Died from   Allergic rhinitis Neg Hx    Asthma Neg Hx    Eczema Neg Hx    Urticaria Neg Hx     Social History:  reports that she quit smoking about 56 years ago. Her smoking use included cigarettes. She has a 7.50 pack-year smoking history. She has never been exposed to tobacco smoke. She has never used smokeless tobacco. She reports that she does not drink alcohol and does not use drugs.  Allergies:  Allergies  Allergen Reactions   Adhesive [Tape] Rash   Celebrex [Celecoxib] Hives   Ciprofibrate Nausea Only   Cymbalta [Duloxetine Hcl] Swelling   Gabitril [Tiagabine] Swelling   Lyrica [Pregabalin] Swelling   Neurontin [Gabapentin] Swelling   Nexium [Esomeprazole] Rash   Nsaids Rash   Shrimp [Shellfish Allergy] Anaphylaxis    Per patient "shrimp only"   Azactam [Aztreonam]     Hand swelling    Azelastine Hcl     Rash    Ciprofloxacin Other (See Comments)    dizziness   Claritin [Loratadine]     Irritability Nervousness  Methotrexate Derivatives    Nasacort [Triamcinolone]     Dizzy    Olopatadine Other (See Comments)    Pain and lethargy    Other    Sulfamethizole Other (See Comments)    unknown   Zantac [Ranitidine Hcl] Other (See Comments)    unknown   Claritin-D 12 Hour [Loratadine-Pseudoephedrine Er] Anxiety   Keflex [Cephalexin] Nausea And Vomiting    Tolerated Ancef   Penicillins Rash    Injection site reaction. Tolerated cefepime in past. Also reports tolerating a penicillin infusion after this initial rxn ~20 yrs ago.  Has patient had a PCN reaction causing immediate rash, facial/tongue/throat swelling, SOB or lightheadedness with hypotension: No Has patient had a PCN reaction causing severe rash involving mucus membranes or skin necrosis: No Has patient had a PCN reaction that required hospitalization No Has patient had a  PCN reaction occurring within the last 10 years: No     Sulfa Antibiotics Nausea And Vomiting    Medications: I have reviewed the patient's current medications.  Results for orders placed or performed during the hospital encounter of 02/12/23 (from the past 48 hour(s))  Lactic acid, plasma     Status: Abnormal   Collection Time: 02/12/23 10:46 AM  Result Value Ref Range   Lactic Acid, Venous 2.3 (HH) 0.5 - 1.9 mmol/L    Comment: CRITICAL RESULT CALLED TO, READ BACK BY AND VERIFIED WITH RN P DUNLAP AT 1216 02/12/23 CRUICKSHANK A Performed at [facility], [place] [address], [place], [place] [address]   Comprehensive metabolic panel     Status: Abnormal   Collection Time: 02/12/23 11:20 AM  Result Value Ref Range   Sodium 139 135 - 145 mmol/L   Potassium 4.1 3.5 - 5.1 mmol/L   Chloride 99 98 - 111 mmol/L   CO2 27 22 - 32 mmol/L   Glucose, Bld 125 (H) 70 - 99 mg/dL    Comment: Glucose reference range applies only to samples taken after fasting for at least 8 hours.   BUN 17 8 - 23 mg/dL   Creatinine, Ser 1.09 (H) 0.44 - 1.00 mg/dL   Calcium 9.4 8.9 - 10.3 mg/dL   Total Protein 7.9 6.5 - 8.1 g/dL   Albumin 3.8 3.5 - 5.0 g/dL   AST 26 15 - 41 U/L   ALT 15 0 - 44 U/L   Alkaline Phosphatase 61 38 - 126 U/L   Total Bilirubin 1.4 (H) 0.3 - 1.2 mg/dL   GFR, Estimated 49 (L) >60 mL/min    Comment: (NOTE) Calculated using the CKD-EPI Creatinine Equation (2021)    Anion gap 13 5 - 15    Comment: Performed at [facility], [place] [address] [place], [place] [address]  CBC with Differential     Status: Abnormal   Collection Time: 02/12/23 11:20 AM  Result Value Ref Range   WBC 15.7 (H) 4.0 - 10.5 K/uL   RBC 4.63 3.87 - 5.11 MIL/uL   Hemoglobin 13.7 12.0 - 15.0 g/dL   HCT 44.7 36.0 - 46.0 %   MCV 96.5 80.0 - 100.0 fL   MCH 29.6 26.0 - 34.0 pg   MCHC 30.6 30.0 - 36.0 g/dL   RDW 15.2 11.5 - 15.5 %   Platelets 278 150 - 400 K/uL   nRBC 0.0 0.0 - 0.2 %    Neutrophils Relative % 68 %   Neutro Abs 10.8 (H) 1.7 - 7.7 K/uL   Lymphocytes Relative 20 %  Lymphs Abs 3.2 0.7 - 4.0 K/uL   Monocytes Relative 9 %   Monocytes Absolute 1.4 (H) 0.1 - 1.0 K/uL   Eosinophils Relative 1 %   Eosinophils Absolute 0.2 0.0 - 0.5 K/uL   Basophils Relative 1 %   Basophils Absolute 0.1 0.0 - 0.1 K/uL   Immature Granulocytes 1 %   Abs Immature Granulocytes 0.10 (H) 0.00 - 0.07 K/uL    Comment: Performed at [facility], [place] [address], [place], [place] [address]  Protime-INR     Status: None   Collection Time: 02/12/23 11:20 AM  Result Value Ref Range   Prothrombin Time 13.9 11.4 - 15.2 seconds   INR 1.1 0.8 - 1.2    Comment: (NOTE) INR goal varies based on device and disease states. Performed at [facility], [place] [address] [place], [place] [address]   APTT     Status: None   Collection Time: 02/12/23 11:20 AM  Result Value Ref Range   aPTT 26 24 - 36 seconds    Comment: Performed at [facility], [place] [address] [place], [place] [address]  Blood Culture (routine x 2)     Status: None (Preliminary result)   Collection Time: 02/12/23 11:20 AM   Specimen: BLOOD RIGHT ARM  Result Value Ref Range   Specimen Description      BLOOD RIGHT ARM Performed at [facility] [address], [place], [place] [address]    Special Requests      BOTTLES DRAWN AEROBIC AND ANAEROBIC Blood Culture adequate volume Performed at [facility] [address], [place], [place] [address]    Culture      NO GROWTH < 24 HOURS Performed at [facility] [address] [place],  [address]    Report Status PENDING   Urinalysis, w/ Reflex to Culture (Infection Suspected) -Urine, Clean Catch     Status: Abnormal   Collection Time: 02/12/23 11:21 AM  Result Value Ref Range   Specimen Source URINE, CLEAN CATCH    Color, Urine YELLOW YELLOW   APPearance CLEAR CLEAR    Specific Gravity, Urine 1.017 1.005 - 1.030   pH 6.0 5.0 - 8.0   Glucose, UA NEGATIVE NEGATIVE mg/dL   Hgb urine dipstick MODERATE (A) NEGATIVE   Bilirubin Urine NEGATIVE NEGATIVE   Ketones, ur 20 (A) NEGATIVE mg/dL   Protein, ur NEGATIVE NEGATIVE mg/dL   Nitrite NEGATIVE NEGATIVE   Leukocytes,Ua NEGATIVE NEGATIVE   RBC / HPF 21-50 0 - 5 RBC/hpf   WBC, UA 0-5 0 - 5 WBC/hpf    Comment:        Reflex urine culture not performed if WBC <=10, OR if Squamous epithelial cells >5. If Squamous epithelial cells >5 suggest recollection.    Bacteria, UA RARE (A) NONE SEEN   Squamous Epithelial / HPF 0-5 0 - 5 /HPF   Mucus PRESENT     Comment: Performed at [facility], [place] [address], [place],  [address]  Resp panel by RT-PCR (RSV, Flu A&B, Covid) Peripheral     Status: None   Collection Time: 02/12/23 11:21 AM   Specimen: Peripheral; Nasal Swab  Result Value Ref Range   SARS Coronavirus 2 by RT PCR NEGATIVE NEGATIVE    Comment: (NOTE) SARS-CoV-2 target nucleic acids are NOT DETECTED.  The SARS-CoV-2 RNA is generally detectable in upper respiratory specimens during the acute phase of infection. The lowest concentration of  SARS-CoV-2 viral copies this assay can detect is 138 copies/mL. A negative result does not preclude SARS-Cov-2 infection and should not be used as the sole basis for treatment or other patient management decisions. A negative result may occur with  improper specimen collection/handling, submission of specimen other than nasopharyngeal swab, presence of viral mutation(s) within the areas targeted by this assay, and inadequate number of viral copies(<138 copies/mL). A negative result must be combined with clinical observations, patient history, and epidemiological information. The expected result is Negative.  Fact Sheet for Patients:  EntrepreneurPulse.com.au  Fact Sheet for Healthcare Providers:   IncredibleEmployment.be  This test is no t yet approved or cleared by the [place] FDA and  has been authorized for detection and/or diagnosis of SARS-CoV-2 by FDA under an Emergency Use Authorization (EUA). This EUA will remain  in effect (meaning this test can be used) for the duration of the COVID-19 declaration under Section 564(b)(1) of the Act, 21 U.S.C.section 360bbb-3(b)(1), unless the authorization is terminated  or revoked sooner.       Influenza A by PCR NEGATIVE NEGATIVE   Influenza B by PCR NEGATIVE NEGATIVE    Comment: (NOTE) The Xpert Xpress SARS-CoV-2/FLU/RSV plus assay is intended as an aid in the diagnosis of influenza from Nasopharyngeal swab specimens and should not be used as a sole basis for treatment. Nasal washings and aspirates are unacceptable for Xpert Xpress SARS-CoV-2/FLU/RSV testing.  Fact Sheet for Patients: EntrepreneurPulse.com.au  Fact Sheet for Healthcare Providers: IncredibleEmployment.be  This test is not yet approved or cleared by the [place] FDA and has been authorized for detection and/or diagnosis of SARS-CoV-2 by FDA under an Emergency Use Authorization (EUA). This EUA will remain in effect (meaning this test can be used) for the duration of the COVID-19 declaration under Section 564(b)(1) of the Act, 21 U.S.C. section 360bbb-3(b)(1), unless the authorization is terminated or revoked.     Resp Syncytial Virus by PCR NEGATIVE NEGATIVE    Comment: (NOTE) Fact Sheet for Patients: EntrepreneurPulse.com.au  Fact Sheet for Healthcare Providers: IncredibleEmployment.be  This test is not yet approved or cleared by the [place] FDA and has been authorized for detection and/or diagnosis of SARS-CoV-2 by FDA under an Emergency Use Authorization (EUA). This EUA will remain in effect (meaning this test can be used) for the duration of  the COVID-19 declaration under Section 564(b)(1) of the Act, 21 U.S.C. section 360bbb-3(b)(1), unless the authorization is terminated or revoked.  Performed at [facility], [place] [address] [place], [place] [address]   Blood Culture (routine x 2)     Status: None (Preliminary result)   Collection Time: 02/12/23 11:34 AM   Specimen: BLOOD  Result Value Ref Range   Specimen Description      BLOOD BLOOD LEFT ARM Performed at [facility] [address] [place], [place] [address]    Special Requests      BOTTLES DRAWN AEROBIC AND ANAEROBIC Blood Culture results may not be optimal due to an inadequate volume of blood received in culture bottles Performed at [facility], [place] [address], [place], [place] [address]    Culture      NO GROWTH < 24 HOURS Performed at [facility] [address], [place], [place] [address]    Report Status PENDING   Lactic acid, plasma     Status: Abnormal   Collection Time: 02/12/23  1:07 PM  Result Value Ref Range   Lactic Acid, Venous  2.6 (HH) 0.5 - 1.9 mmol/L    Comment: CRITICAL VALUE NOTED. VALUE IS CONSISTENT WITH PREVIOUSLY REPORTED/CALLED VALUE Performed at [facility] [address], [place], [place] [address]   Lactic acid, plasma     Status: None   Collection Time: 02/12/23  5:07 PM  Result Value Ref Range   Lactic Acid, Venous 1.4 0.5 - 1.9 mmol/L    Comment: Performed at [facility], [place] [address] [place], [place] [address]  Lactic acid, plasma     Status: None   Collection Time: 02/12/23  7:08 PM  Result Value Ref Range   Lactic Acid, Venous 1.3 0.5 - 1.9 mmol/L    Comment: Performed at [facility], [place] [address] [place], [place] [address]  CBC     Status: Abnormal   Collection Time: 02/13/23  4:35 AM  Result Value Ref Range   WBC 12.4 (H) 4.0 - 10.5 K/uL   RBC 3.96 3.87 - 5.11 MIL/uL   Hemoglobin 11.4  (L) 12.0 - 15.0 g/dL   HCT 38.5 36.0 - 46.0 %   MCV 97.2 80.0 - 100.0 fL   MCH 28.8 26.0 - 34.0 pg   MCHC 29.6 (L) 30.0 - 36.0 g/dL   RDW 15.2 11.5 - 15.5 %   Platelets 250 150 - 400 K/uL   nRBC 0.0 0.0 - 0.2 %    Comment: Performed at [facility], [place] [address] [place], [place] [id number]  Basic metabolic panel     Status: Abnormal   Collection Time: 02/13/23  4:35 AM  Result Value Ref Range   Sodium 140 135 - 145 mmol/L   Potassium 3.4 (L) 3.5 - 5.1 mmol/L   Chloride 104 98 - 111 mmol/L   CO2 25 22 - 32 mmol/L   Glucose, Bld 104 (H) 70 - 99 mg/dL    Comment: Glucose reference range applies only to samples taken after fasting for at least 8 hours.   BUN 14 8 - 23 mg/dL   Creatinine, Ser 0.88 0.44 - 1.00 mg/dL   Calcium 9.2 8.9 - 10.3 mg/dL   GFR, Estimated >60 >60 mL/min    Comment: (NOTE) Calculated using the CKD-EPI Creatinine Equation (2021)    Anion gap 11 5 - 15    Comment: Performed at [facility], [place] [address], [place], [place] [address]    MR LUMBAR SPINE WO CONTRAST  Result Date: 02/13/2023 CLINICAL DATA:  Lumbar radiculopathy EXAM: MRI LUMBAR SPINE WITHOUT CONTRAST TECHNIQUE: Multiplanar, multisequence MR imaging of the lumbar spine was performed. No intravenous contrast was administered. COMPARISON:  09/09/2019 FINDINGS: Evaluation is limited by motion. Segmentation: 5 lumbar type vertebral bodies. Alignment: Trace anterolisthesis of L5 on S1, unchanged. Mild levocurvature of the upper lumbar spine, with compensatory dextrocurvature of the lower lumbar spine. Vertebrae:  No fracture, evidence of discitis, or bone lesion. Conus medullaris and cauda equina: Conus extends to the L1 level. Conus and cauda equina appear normal. Paraspinal and other soft tissues: Renal cysts, for which no follow-up is currently indicated. Atrophy of the paraspinous musculature. Disc levels: T12-L1: Disc height loss and mild disc bulge. Mild facet  arthropathy. Mild spinal canal stenosis and moderate right neural foraminal narrowing, unchanged. L1-L2: Disc height loss and mild disc bulge moderate facet arthropathy. Ligamentum flavum hypertrophy. Mild-to-moderate spinal canal stenosis, unchanged. Narrowing of the lateral recesses. No neural foraminal narrowing. L2-L3: Disc height loss and minimal disc bulge with superimposed left subarticular disc  extrusion with 13 mm caudal migration (series 5, image 11), which appears new from the prior exam. This contacts the descending left L3 nerve roots. Mild spinal canal stenosis. No significant neural foraminal narrowing. L3-L4: Prior fusion with interbody disc spacer. Mild facet arthropathy. Ligamentum flavum hypertrophy. Narrowing of the lateral recesses. Mild spinal canal stenosis and mild bilateral neural foraminal narrowing, unchanged. L4-L5: Prior fusion with interbody disc spacer. Mild facet arthropathy. Narrowing of the lateral recesses. No spinal canal stenosis. Mild bilateral neural foraminal narrowing, unchanged. L5-S1: Trace anterolisthesis with disc unroofing and small central disc protrusion. Moderate facet arthropathy. No spinal canal stenosis or neural foraminal narrowing. IMPRESSION: 1. Evaluation is limited by motion. Within this limitation, there is a new left subarticular disc extrusion at L2-L3 with caudal migration, which contacts the descending left L3 nerve roots. 2. L1-L2 mild-to-moderate spinal canal stenosis, unchanged. 3. T12-L1 mild spinal canal stenosis and moderate right neural foraminal narrowing, unchanged. 4. L3-L4 mild spinal canal stenosis and mild bilateral neural foraminal narrowing, unchanged. L4-L5 mild bilateral neural foraminal narrowing, unchanged. 5. Narrowing of the lateral recesses at L1-L2, L3-L4, and L4-L5 could affect the descending L2, L4, and L5 nerve roots, respectively. Electronically Signed   By: Merilyn Baba M.D.   On: 02/13/2023 02:45   CT HEAD WO CONTRAST  (5MM)  Result Date: 02/12/2023 CLINICAL DATA:  Neuro deficit, acute, stroke suspected EXAM: CT HEAD WITHOUT CONTRAST TECHNIQUE: Contiguous axial images were obtained from the base of the skull through the vertex without intravenous contrast. RADIATION DOSE REDUCTION: This exam was performed according to the departmental dose-optimization program which includes automated exposure control, adjustment of the mA and/or kV according to patient size and/or use of iterative reconstruction technique. COMPARISON:  None Available. FINDINGS: Brain: No evidence of acute large vascular territory infarction, hemorrhage, hydrocephalus, extra-axial collection or mass lesion/mass effect. Moderate patchy white matter hypodensities, nonspecific but compatible with chronic microvascular ischemic disease. Cerebral atrophy Vascular: No hyperdense vessel. Skull: No acute fracture. Sinuses/Orbits: Mild paranasal sinus mucosal thickening. No acute orbital findings. Other: No mastoid effusions. IMPRESSION: 1. No evidence of acute intracranial abnormality. 2. Moderate chronic microvascular ischemic disease. Electronically Signed   By: Margaretha Sheffield M.D.   On: 02/12/2023 17:54   CT ABDOMEN PELVIS W CONTRAST  Result Date: 02/12/2023 CLINICAL DATA:  89 year old female status post fall from toilet this morning. Abdominal pain. EXAM: CT ABDOMEN AND PELVIS WITH CONTRAST TECHNIQUE: Multidetector CT imaging of the abdomen and pelvis was performed using the standard protocol following bolus administration of intravenous contrast. RADIATION DOSE REDUCTION: This exam was performed according to the departmental dose-optimization program which includes automated exposure control, adjustment of the mA and/or kV according to patient size and/or use of iterative reconstruction technique. CONTRAST:  160m OMNIPAQUE IOHEXOL 300 MG/ML  SOLN COMPARISON:  CT Abdomen and Pelvis 05/21/2022. FINDINGS: Lower chest: Lung bases are stable from last year with  combined mild atelectasis, scarring, and also chronic postinflammatory small lung nodularity. No pericardial effusion, pleural effusion, or acute lung base opacity. Hepatobiliary: Liver and gallbladder appear stable and negative. Pancreas: Chronic pancreatic atrophy. Spleen: Negative. Adrenals/Urinary Tract: Normal adrenal glands. Kidneys are stable and nonobstructed. Small chronic benign appearing renal cysts (no follow-up imaging recommended). Symmetric renal enhancement and contrast excretion. No hydroureter. Unremarkable bladder. Stomach/Bowel: Chronic sigmoid diverticulosis, maximal in the anterior sigmoid and junction with the descending colon. That segment appears stable from the CT last year, no definite active inflammation. Mild large bowel redundancy and retained stool otherwise. Cecum located in  the midline today. Elongated but normal appendix tracking to the right (series 2, image 58). No dilated small bowel. Small gastric hiatal hernia or phrenic ampulla (normal variant). Otherwise decompressed stomach and duodenum. No free air or free fluid identified. Stable chronic rectus muscle diastasis. Vascular/Lymphatic: Aortoiliac calcified atherosclerosis. Normal abdominal aortic caliber. Major arterial structures remain patent. Portal venous system is patent. No lymphadenopathy identified. Reproductive: Stable simple 3.8 cm left ovarian cyst since at least 2019 (no follow-up imaging recommended). Diminutive or absent uterus. Negative right ovary. Other: No pelvis free fluid. Musculoskeletal: Advanced chronic spine degeneration and previous lumbar interbody fusion. Background osteopenia. 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 No acute osseous abnormality identified. But evidence of a new caudal left lateral recess disc extrusion since last year at L2-L3 (series 2, image 36). IMPRESSION: 1. No acute traumatic injury identified. No acute or inflammatory process identified in the abdomen or  pelvis. 2. 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 Query left L3 radiculitis. 3.  Aortic Atherosclerosis (ICD10-I70.0). Electronically Signed   By: Genevie Ann M.D.   On: 02/12/2023 13:04   DG Chest Port 1 View  Result Date: 02/12/2023 CLINICAL DATA:  Questionable sepsis EXAM: PORTABLE CHEST 1 VIEW COMPARISON:  10/30/2022 FINDINGS: Low volume chest. There is no edema, consolidation, effusion, or pneumothorax. Normal heart size and mediastinal contours. Artifact from EKG leads. IMPRESSION: Stable low volume chest.  No acute finding. Electronically Signed   By: Jorje Guild M.D.   On: 02/12/2023 11:08   DG Foot Complete Left  Result Date: 02/12/2023 CLINICAL DATA:  Fall.  Questionable sepsis. EXAM: LEFT FOOT - COMPLETE 3+ VIEW COMPARISON:  10/30/2022 FINDINGS: There is no evidence of fracture or dislocation. There is no evidence of arthropathy or other focal bone abnormality. Soft tissues are unremarkable. IMPRESSION: Negative. Electronically Signed   By: Jorje Guild M.D.   On: 02/12/2023 11:07    Review of Systems  Musculoskeletal:  Positive for back pain and gait problem.   Blood pressure (!) 141/72, pulse 76, temperature 97.7 F (36.5 C), temperature source Oral, resp. rate 16, height '5\' 2"'$  (1.575 m), weight 81.6 kg, SpO2 97 %. Physical Exam HENT:     Head: Normocephalic.     Right Ear: Tympanic membrane normal.     Nose: Nose normal.     Mouth/Throat:     Mouth: Mucous membranes are moist.  Eyes:     Pupils: Pupils are equal, round, and reactive to light.  Cardiovascular:     Rate and Rhythm: Normal rate.  Pulmonary:     Effort: Pulmonary effort is normal.  Abdominal:     General: Abdomen is flat.  Musculoskeletal:        General: Normal range of motion.  Skin:    General: Skin is warm.  Neurological:     Mental Status: She is alert.     Comments: Patient is awake alert strength seems to be 5/5 iliopsoas, quads,  hamstrings, gastroc, anterior tibialis, EHL.     Assessment/Plan:   89 year old female with a looks like a new disc herniation [id number] on the left certainly could give her some left hip pain however she is not a surgical candidate.  Certainly she is not not having any pain right now so mobilize with physical occupational therapy consider increasing her steroids patient may be a candidate for an epidural steroid injection but physical therapy being the mainstay of treatment at this  point.  No other new neurosurgical recommendations  Elaina Hoops 02/13/2023, 1:43 PM

## 2023-02-13 NOTE — Progress Notes (Signed)
MRI was reviewed which shows a left disc extrusion at L2-3, moderate chronic stenosis at L1-2. Given the fact that she is septic, there is nothing we can do from a neurosurgery standpoint. Sepsis takes precedence at this point. This is something that can be followed up as an outpatient with pain control inpatient.

## 2023-02-13 NOTE — Consult Note (Signed)
Reason for Consult: Back pain Referring Physician: Medicine  Eileen Wilkerson is an 89 y.o. female.  HPI: 89 year old female with multiple medical issues including adrenal insufficiency, rheumatoid arthritis, and at least 2 fairly recent bouts of significant urosepsis requiring hospitalization.  Patient admitted yesterday after a fall.  The patient has been dealing with progressively worsening lumbar pain over the past few months.  Over the past couple of weeks she has had pain in her left lateral hip and proximal thigh.  She does feel that occasionally her left leg goes out from under.  It is unclear whether her recent fall was because of this pain or because of other medical issues.  Currently the patient is lying in bed where she is reasonably comfortable.  She denies any current radicular pain numbness or weakness.  She states that she has not been out of bed today.  Past Medical History:  Diagnosis Date   Adrenal failure ([place])    Arthritis    Asthma    Cancer ([place])    Cataract    Closed nondisplaced fracture of fifth right metatarsal bone 09/18/2017   Diverticulitis    Per patient   E coli bacteremia    Fibromyalgia 2008   GERD (gastroesophageal reflux disease) 12/18/2016   HCAP (healthcare-associated pneumonia) 02/03/2018   Hyperlipidemia 10/13/2021   Osteoporosis    RA (rheumatoid arthritis) ([place])    Recurrent upper respiratory infection (URI)    Sepsis due to urinary tract infection ([place] [place]) 01/18/2018   Urticaria     Past Surgical History:  Procedure Laterality Date   BACK SURGERY     BILATERAL CARPAL TUNNEL RELEASE  2005   right and left   CATARACT EXTRACTION, BILATERAL  2004   right and left   CERVICAL FUSION  2011,2010,2008   2 disks   HEEL SPUR SURGERY  2004   lower back fusion  2011   Fusion of 3-4 and 4-5 lower back   RADIOFREQUENCY ABLATION  2020   ROTATOR CUFF REPAIR  [id number]   SQUAMOUS CELL CARCINOMA EXCISION     TONSILLECTOMY AND ADENOIDECTOMY  1947    TOTAL SHOULDER ARTHROPLASTY      Family History  Problem Relation Age of Onset   Heart attack Maternal Grandmother    Heart attack Paternal Grandfather    Breast cancer Mother 10   Diabetes Father    Heart disease Father    Congestive Heart Failure Father 27       Died from   Allergic rhinitis Neg Hx    Asthma Neg Hx    Eczema Neg Hx    Urticaria Neg Hx     Social History:  reports that she quit smoking about 56 years ago. Her smoking use included cigarettes. She has a 7.50 pack-year smoking history. She has never been exposed to tobacco smoke. She has never used smokeless tobacco. She reports that she does not drink alcohol and does not use drugs.  Allergies:  Allergies  Allergen Reactions   Adhesive [Tape] Rash   Celebrex [Celecoxib] Hives   Ciprofibrate Nausea Only   Cymbalta [Duloxetine Hcl] Swelling   Gabitril [Tiagabine] Swelling   Lyrica [Pregabalin] Swelling   Neurontin [Gabapentin] Swelling   Nexium [Esomeprazole] Rash   Nsaids Rash   Shrimp [Shellfish Allergy] Anaphylaxis    Per patient "shrimp only"   Azactam [Aztreonam]     Hand swelling    Azelastine Hcl     Rash    Ciprofloxacin Other (See Comments)  dizziness   Claritin [Loratadine]     Irritability Nervousness    Methotrexate Derivatives    Nasacort [Triamcinolone]     Dizzy    Olopatadine Other (See Comments)    Pain and lethargy    Other    Sulfamethizole Other (See Comments)    unknown   Zantac [Ranitidine Hcl] Other (See Comments)    unknown   Claritin-D 12 Hour [Loratadine-Pseudoephedrine Er] Anxiety   Keflex [Cephalexin] Nausea And Vomiting    Tolerated Ancef   Penicillins Rash    Injection site reaction. Tolerated cefepime in past. Also reports tolerating a penicillin infusion after this initial rxn ~20 yrs ago.  Has patient had a PCN reaction causing immediate rash, facial/tongue/throat swelling, SOB or lightheadedness with hypotension: No Has patient had a PCN reaction causing  severe rash involving mucus membranes or skin necrosis: No Has patient had a PCN reaction that required hospitalization No Has patient had a PCN reaction occurring within the last 10 years: No     Sulfa Antibiotics Nausea And Vomiting    Medications: I have reviewed the patient's current medications.  Results for orders placed or performed during the hospital encounter of 02/12/23 (from the past 48 hour(s))  Lactic acid, plasma     Status: Abnormal   Collection Time: 02/12/23 10:46 AM  Result Value Ref Range   Lactic Acid, Venous 2.3 (HH) 0.5 - 1.9 mmol/L    Comment: CRITICAL RESULT CALLED TO, READ BACK BY AND VERIFIED WITH RN P DUNLAP AT 1216 02/12/23 CRUICKSHANK A Performed at [facility], [place] [address], [place], [place] [address]   Comprehensive metabolic panel     Status: Abnormal   Collection Time: 02/12/23 11:20 AM  Result Value Ref Range   Sodium 139 135 - 145 mmol/L   Potassium 4.1 3.5 - 5.1 mmol/L   Chloride 99 98 - 111 mmol/L   CO2 27 22 - 32 mmol/L   Glucose, Bld 125 (H) 70 - 99 mg/dL    Comment: Glucose reference range applies only to samples taken after fasting for at least 8 hours.   BUN 17 8 - 23 mg/dL   Creatinine, Ser 1.09 (H) 0.44 - 1.00 mg/dL   Calcium 9.4 8.9 - 10.3 mg/dL   Total Protein 7.9 6.5 - 8.1 g/dL   Albumin 3.8 3.5 - 5.0 g/dL   AST 26 15 - 41 U/L   ALT 15 0 - 44 U/L   Alkaline Phosphatase 61 38 - 126 U/L   Total Bilirubin 1.4 (H) 0.3 - 1.2 mg/dL   GFR, Estimated 49 (L) >60 mL/min    Comment: (NOTE) Calculated using the CKD-EPI Creatinine Equation (2021)    Anion gap 13 5 - 15    Comment: Performed at [facility], [place] [address], [place], [place] [address]  CBC with Differential     Status: Abnormal   Collection Time: 02/12/23 11:20 AM  Result Value Ref Range   WBC 15.7 (H) 4.0 - 10.5 K/uL   RBC 4.63 3.87 - 5.11 MIL/uL   Hemoglobin 13.7 12.0 - 15.0 g/dL   HCT 44.7 36.0 - 46.0 %   MCV 96.5 80.0 - 100.0  fL   MCH 29.6 26.0 - 34.0 pg   MCHC 30.6 30.0 - 36.0 g/dL   RDW 15.2 11.5 - 15.5 %   Platelets 278 150 - 400 K/uL   nRBC 0.0 0.0 - 0.2 %   Neutrophils Relative % 68 %   Neutro Abs  10.8 (H) 1.7 - 7.7 K/uL   Lymphocytes Relative 20 %   Lymphs Abs 3.2 0.7 - 4.0 K/uL   Monocytes Relative 9 %   Monocytes Absolute 1.4 (H) 0.1 - 1.0 K/uL   Eosinophils Relative 1 %   Eosinophils Absolute 0.2 0.0 - 0.5 K/uL   Basophils Relative 1 %   Basophils Absolute 0.1 0.0 - 0.1 K/uL   Immature Granulocytes 1 %   Abs Immature Granulocytes 0.10 (H) 0.00 - 0.07 K/uL    Comment: Performed at [facility], [place] [address] [place], [place] [address]  Protime-INR     Status: None   Collection Time: 02/12/23 11:20 AM  Result Value Ref Range   Prothrombin Time 13.9 11.4 - 15.2 seconds   INR 1.1 0.8 - 1.2    Comment: (NOTE) INR goal varies based on device and disease states. Performed at [facility], [place] [address], [place], [place] [address]   APTT     Status: None   Collection Time: 02/12/23 11:20 AM  Result Value Ref Range   aPTT 26 24 - 36 seconds    Comment: Performed at [facility], [place] [address] [place], [place] [address]  Blood Culture (routine x 2)     Status: None (Preliminary result)   Collection Time: 02/12/23 11:20 AM   Specimen: BLOOD RIGHT ARM  Result Value Ref Range   Specimen Description      BLOOD RIGHT ARM Performed at [facility] [address] [place], [place] [address]    Special Requests      BOTTLES DRAWN AEROBIC AND ANAEROBIC Blood Culture adequate volume Performed at [facility] [address] [place], [place] [address]    Culture      NO GROWTH < 24 HOURS Performed at [facility] [address], [place], [place] [address]    Report Status PENDING   Urinalysis, w/ Reflex to Culture (Infection Suspected) -Urine, Clean Catch     Status: Abnormal   Collection  Time: 02/12/23 11:21 AM  Result Value Ref Range   Specimen Source URINE, CLEAN CATCH    Color, Urine YELLOW YELLOW   APPearance CLEAR CLEAR   Specific Gravity, Urine 1.017 1.005 - 1.030   pH 6.0 5.0 - 8.0   Glucose, UA NEGATIVE NEGATIVE mg/dL   Hgb urine dipstick MODERATE (A) NEGATIVE   Bilirubin Urine NEGATIVE NEGATIVE   Ketones, ur 20 (A) NEGATIVE mg/dL   Protein, ur NEGATIVE NEGATIVE mg/dL   Nitrite NEGATIVE NEGATIVE   Leukocytes,Ua NEGATIVE NEGATIVE   RBC / HPF 21-50 0 - 5 RBC/hpf   WBC, UA 0-5 0 - 5 WBC/hpf    Comment:        Reflex urine culture not performed if WBC <=10, OR if Squamous epithelial cells >5. If Squamous epithelial cells >5 suggest recollection.    Bacteria, UA RARE (A) NONE SEEN   Squamous Epithelial / HPF 0-5 0 - 5 /HPF   Mucus PRESENT     Comment: Performed at [facility], [place] [address], [place], [place] [address]  Resp panel by RT-PCR (RSV, Flu A&B, Covid) Peripheral     Status: None   Collection Time: 02/12/23 11:21 AM   Specimen: Peripheral; Nasal Swab  Result Value Ref Range   SARS Coronavirus 2 by RT PCR NEGATIVE NEGATIVE    Comment: (NOTE) SARS-CoV-2 target nucleic acids are NOT DETECTED.  The SARS-CoV-2 RNA is generally detectable  in upper respiratory specimens during the acute phase of infection. The lowest concentration of SARS-CoV-2 viral copies this assay can detect is 138 copies/mL. A negative result does not preclude SARS-Cov-2 infection and should not be used as the sole basis for treatment or other patient management decisions. A negative result may occur with  improper specimen collection/handling, submission of specimen other than nasopharyngeal swab, presence of viral mutation(s) within the areas targeted by this assay, and inadequate number of viral copies(<138 copies/mL). A negative result must be combined with clinical observations, patient history, and epidemiological information. The expected result is  Negative.  Fact Sheet for Patients:  EntrepreneurPulse.com.au  Fact Sheet for Healthcare Providers:  IncredibleEmployment.be  This test is no t yet approved or cleared by the [place] FDA and  has been authorized for detection and/or diagnosis of SARS-CoV-2 by FDA under an Emergency Use Authorization (EUA). This EUA will remain  in effect (meaning this test can be used) for the duration of the COVID-19 declaration under Section 564(b)(1) of the Act, 21 U.S.C.section 360bbb-3(b)(1), unless the authorization is terminated  or revoked sooner.       Influenza A by PCR NEGATIVE NEGATIVE   Influenza B by PCR NEGATIVE NEGATIVE    Comment: (NOTE) The Xpert Xpress SARS-CoV-2/FLU/RSV plus assay is intended as an aid in the diagnosis of influenza from Nasopharyngeal swab specimens and should not be used as a sole basis for treatment. Nasal washings and aspirates are unacceptable for Xpert Xpress SARS-CoV-2/FLU/RSV testing.  Fact Sheet for Patients: EntrepreneurPulse.com.au  Fact Sheet for Healthcare Providers: IncredibleEmployment.be  This test is not yet approved or cleared by the [place] FDA and has been authorized for detection and/or diagnosis of SARS-CoV-2 by FDA under an Emergency Use Authorization (EUA). This EUA will remain in effect (meaning this test can be used) for the duration of the COVID-19 declaration under Section 564(b)(1) of the Act, 21 U.S.C. section 360bbb-3(b)(1), unless the authorization is terminated or revoked.     Resp Syncytial Virus by PCR NEGATIVE NEGATIVE    Comment: (NOTE) Fact Sheet for Patients: EntrepreneurPulse.com.au  Fact Sheet for Healthcare Providers: IncredibleEmployment.be  This test is not yet approved or cleared by the [place] FDA and has been authorized for detection and/or diagnosis of SARS-CoV-2 by FDA under an  Emergency Use Authorization (EUA). This EUA will remain in effect (meaning this test can be used) for the duration of the COVID-19 declaration under Section 564(b)(1) of the Act, 21 U.S.C. section 360bbb-3(b)(1), unless the authorization is terminated or revoked.  Performed at [facility], [place] [address] [place], [place] [address]   Blood Culture (routine x 2)     Status: None (Preliminary result)   Collection Time: 02/12/23 11:34 AM   Specimen: BLOOD  Result Value Ref Range   Specimen Description      BLOOD BLOOD LEFT ARM Performed at [facility] [address], [place], [place] [address]    Special Requests      BOTTLES DRAWN AEROBIC AND ANAEROBIC Blood Culture results may not be optimal due to an inadequate volume of blood received in culture bottles Performed at [facility], [place] [address] [place], [place] [address]    Culture      NO GROWTH < 24 HOURS Performed at [facility] [address] [place], [place] [address]    Report Status PENDING   Lactic acid, plasma     Status: Abnormal   Collection Time:  02/12/23  1:07 PM  Result Value Ref Range   Lactic Acid, Venous 2.6 (HH) 0.5 - 1.9 mmol/L    Comment: CRITICAL VALUE NOTED. VALUE IS CONSISTENT WITH PREVIOUSLY REPORTED/CALLED VALUE Performed at [facility] [address], [place], [place] [address]   Lactic acid, plasma     Status: None   Collection Time: 02/12/23  5:07 PM  Result Value Ref Range   Lactic Acid, Venous 1.4 0.5 - 1.9 mmol/L    Comment: Performed at [facility], [place] [address] [place], [place] [address]  Lactic acid, plasma     Status: None   Collection Time: 02/12/23  7:08 PM  Result Value Ref Range   Lactic Acid, Venous 1.3 0.5 - 1.9 mmol/L    Comment: Performed at [facility], [place] [address] [place], [place] [address]  CBC     Status: Abnormal   Collection Time:  02/13/23  4:35 AM  Result Value Ref Range   WBC 12.4 (H) 4.0 - 10.5 K/uL   RBC 3.96 3.87 - 5.11 MIL/uL   Hemoglobin 11.4 (L) 12.0 - 15.0 g/dL   HCT 38.5 36.0 - 46.0 %   MCV 97.2 80.0 - 100.0 fL   MCH 28.8 26.0 - 34.0 pg   MCHC 29.6 (L) 30.0 - 36.0 g/dL   RDW 15.2 11.5 - 15.5 %   Platelets 250 150 - 400 K/uL   nRBC 0.0 0.0 - 0.2 %    Comment: Performed at [facility], [place] [address] [place], [place] [id number]  Basic metabolic panel     Status: Abnormal   Collection Time: 02/13/23  4:35 AM  Result Value Ref Range   Sodium 140 135 - 145 mmol/L   Potassium 3.4 (L) 3.5 - 5.1 mmol/L   Chloride 104 98 - 111 mmol/L   CO2 25 22 - 32 mmol/L   Glucose, Bld 104 (H) 70 - 99 mg/dL    Comment: Glucose reference range applies only to samples taken after fasting for at least 8 hours.   BUN 14 8 - 23 mg/dL   Creatinine, Ser 0.88 0.44 - 1.00 mg/dL   Calcium 9.2 8.9 - 10.3 mg/dL   GFR, Estimated >60 >60 mL/min    Comment: (NOTE) Calculated using the CKD-EPI Creatinine Equation (2021)    Anion gap 11 5 - 15    Comment: Performed at [facility], [place] [address] [place],  [address]    MR LUMBAR SPINE WO CONTRAST  Result Date: 02/13/2023 CLINICAL DATA:  Lumbar radiculopathy EXAM: MRI LUMBAR SPINE WITHOUT CONTRAST TECHNIQUE: Multiplanar, multisequence MR imaging of the lumbar spine was performed. No intravenous contrast was administered. COMPARISON:  09/09/2019 FINDINGS: Evaluation is limited by motion. Segmentation: 5 lumbar type vertebral bodies. Alignment: Trace anterolisthesis of L5 on S1, unchanged. Mild levocurvature of the upper lumbar spine, with compensatory dextrocurvature of the lower lumbar spine. Vertebrae:  No fracture, evidence of discitis, or bone lesion. Conus medullaris and cauda equina: Conus extends to the L1 level. Conus and cauda equina appear normal. Paraspinal and other soft tissues: Renal cysts, for which no follow-up is currently  indicated. Atrophy of the paraspinous musculature. Disc levels: T12-L1: Disc height loss and mild disc bulge. Mild facet arthropathy. Mild spinal canal stenosis and moderate right neural foraminal narrowing, unchanged. L1-L2: Disc height loss and mild disc bulge moderate facet arthropathy. Ligamentum flavum hypertrophy. Mild-to-moderate spinal canal stenosis, unchanged. Narrowing of the lateral recesses. No neural foraminal  narrowing. L2-L3: Disc height loss and minimal disc bulge with superimposed left subarticular disc extrusion with 13 mm caudal migration (series 5, image 11), which appears new from the prior exam. This contacts the descending left L3 nerve roots. Mild spinal canal stenosis. No significant neural foraminal narrowing. L3-L4: Prior fusion with interbody disc spacer. Mild facet arthropathy. Ligamentum flavum hypertrophy. Narrowing of the lateral recesses. Mild spinal canal stenosis and mild bilateral neural foraminal narrowing, unchanged. L4-L5: Prior fusion with interbody disc spacer. Mild facet arthropathy. Narrowing of the lateral recesses. No spinal canal stenosis. Mild bilateral neural foraminal narrowing, unchanged. L5-S1: Trace anterolisthesis with disc unroofing and small central disc protrusion. Moderate facet arthropathy. No spinal canal stenosis or neural foraminal narrowing. IMPRESSION: 1. Evaluation is limited by motion. Within this limitation, there is a new left subarticular disc extrusion at L2-L3 with caudal migration, which contacts the descending left L3 nerve roots. 2. L1-L2 mild-to-moderate spinal canal stenosis, unchanged. 3. T12-L1 mild spinal canal stenosis and moderate right neural foraminal narrowing, unchanged. 4. L3-L4 mild spinal canal stenosis and mild bilateral neural foraminal narrowing, unchanged. L4-L5 mild bilateral neural foraminal narrowing, unchanged. 5. Narrowing of the lateral recesses at L1-L2, L3-L4, and L4-L5 could affect the descending L2, L4, and L5 nerve  roots, respectively. Electronically Signed   By: Merilyn [place] M.D.   On: 02/13/2023 02:45   CT HEAD WO CONTRAST (5MM)  Result Date: 02/12/2023 CLINICAL DATA:  Neuro deficit, acute, stroke suspected EXAM: CT HEAD WITHOUT CONTRAST TECHNIQUE: Contiguous axial images were obtained from the base of the skull through the vertex without intravenous contrast. RADIATION DOSE REDUCTION: This exam was performed according to the departmental dose-optimization program which includes automated exposure control, adjustment of the mA and/or kV according to patient size and/or use of iterative reconstruction technique. COMPARISON:  None Available. FINDINGS: Brain: No evidence of acute large vascular territory infarction, hemorrhage, hydrocephalus, extra-axial collection or mass lesion/mass effect. Moderate patchy white matter hypodensities, nonspecific but compatible with chronic microvascular ischemic disease. Cerebral atrophy Vascular: No hyperdense vessel. Skull: No acute fracture. Sinuses/Orbits: Mild paranasal sinus mucosal thickening. No acute orbital findings. Other: No mastoid effusions. IMPRESSION: 1. No evidence of acute intracranial abnormality. 2. Moderate chronic microvascular ischemic disease. Electronically Signed   By: Margaretha Sheffield M.D.   On: 02/12/2023 17:54   CT ABDOMEN PELVIS W CONTRAST  Result Date: 02/12/2023 CLINICAL DATA:  89 year old female status post fall from toilet this morning. Abdominal pain. EXAM: CT ABDOMEN AND PELVIS WITH CONTRAST TECHNIQUE: Multidetector CT imaging of the abdomen and pelvis was performed using the standard protocol following bolus administration of intravenous contrast. RADIATION DOSE REDUCTION: This exam was performed according to the departmental dose-optimization program which includes automated exposure control, adjustment of the mA and/or kV according to patient size and/or use of iterative reconstruction technique. CONTRAST:  175m OMNIPAQUE IOHEXOL 300 MG/ML  SOLN  COMPARISON:  CT Abdomen and Pelvis 05/21/2022. FINDINGS: Lower chest: Lung bases are stable from last year with combined mild atelectasis, scarring, and also chronic postinflammatory small lung nodularity. No pericardial effusion, pleural effusion, or acute lung base opacity. Hepatobiliary: Liver and gallbladder appear stable and negative. Pancreas: Chronic pancreatic atrophy. Spleen: Negative. Adrenals/Urinary Tract: Normal adrenal glands. Kidneys are stable and nonobstructed. Small chronic benign appearing renal cysts (no follow-up imaging recommended). Symmetric renal enhancement and contrast excretion. No hydroureter. Unremarkable bladder. Stomach/Bowel: Chronic sigmoid diverticulosis, maximal in the anterior sigmoid and junction with the descending colon. That segment appears stable from the CT last year, no  definite active inflammation. Mild large bowel redundancy and retained stool otherwise. Cecum located in the midline today. Elongated but normal appendix tracking to the right (series 2, image 58). No dilated small bowel. Small gastric hiatal hernia or phrenic ampulla (normal variant). Otherwise decompressed stomach and duodenum. No free air or free fluid identified. Stable chronic rectus muscle diastasis. Vascular/Lymphatic: Aortoiliac calcified atherosclerosis. Normal abdominal aortic caliber. Major arterial structures remain patent. Portal venous system is patent. No lymphadenopathy identified. Reproductive: Stable simple 3.8 cm left ovarian cyst since at least 2019 (no follow-up imaging recommended). Diminutive or absent uterus. Negative right ovary. Other: No pelvis free fluid. Musculoskeletal: Advanced chronic spine degeneration and previous lumbar interbody fusion. Background osteopenia. 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 No acute osseous abnormality identified. But evidence of a new caudal left lateral recess disc extrusion since last year at L2-L3 (series 2, image 36).  IMPRESSION: 1. No acute traumatic injury identified. No acute or inflammatory process identified in the abdomen or pelvis. 2. 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 Query left L3 radiculitis. 3.  Aortic Atherosclerosis (ICD10-I70.0). Electronically Signed   By: Genevie Ann M.D.   On: 02/12/2023 13:04   DG Chest Port 1 View  Result Date: 02/12/2023 CLINICAL DATA:  Questionable sepsis EXAM: PORTABLE CHEST 1 VIEW COMPARISON:  10/30/2022 FINDINGS: Low volume chest. There is no edema, consolidation, effusion, or pneumothorax. Normal heart size and mediastinal contours. Artifact from EKG leads. IMPRESSION: Stable low volume chest.  No acute finding. Electronically Signed   By: Jorje Guild M.D.   On: 02/12/2023 11:08   DG Foot Complete Left  Result Date: 02/12/2023 CLINICAL DATA:  Fall.  Questionable sepsis. EXAM: LEFT FOOT - COMPLETE 3+ VIEW COMPARISON:  10/30/2022 FINDINGS: There is no evidence of fracture or dislocation. There is no evidence of arthropathy or other focal bone abnormality. Soft tissues are unremarkable. IMPRESSION: Negative. Electronically Signed   By: Jorje Guild M.D.   On: 02/12/2023 11:07    Pertinent items noted in HPI and remainder of comprehensive ROS otherwise negative. Blood pressure (!) 141/72, pulse 76, temperature 97.7 F (36.5 C), temperature source Oral, resp. rate 16, height '5\' 2"'$  (1.575 m), weight 81.6 kg, SpO2 97 %. Patient is awake and alert.  She is oriented and reasonably appropriate.  Speech is fluent.  Judgment insight are intact.  Cranial nerve function normal bilateral.  Motor examination of her extremities reveals 5/5 strength throughout.  She has no sensory deficits.  Reflexes are hypoactive but symmetric.  No evidence of long track signs.  Examination head ears eyes nose and throat is unremarkable.  Chest and abdomen are benign.  Extremities demonstrate abrasions of both knees but no evidence of  significant laceration.   I reviewed the patient's recent MRI scan of her lumbar spine.  This demonstrates evidence of severe multilevel lumbar degenerative disease with a new left-sided L2-3 disc herniation with some caudal migration and significant compression of her left L3 nerve root.  There is no severe thecal sac compression however.  She has chronic degenerative change throughout her lumbar spine with some moderate multifactorial stenosis at L1-L2 3 L3-4 and L4-5. 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  At this point the patient is not a terribly good operative candidate and would like to try to maximize care without considering surgery.  I would recommend starting the patient on IV Decadron to hopefully lessen some of her  radicular pain.  She may be mobilized with physical therapy.  She may be transition to an oral steroid Dosepak upon discharge from the hospital and she should follow-up in my office in 2 to 3 weeks.  Mallie Mussel A Malichi Palardy 02/13/2023, 1:05 PM

## 2023-02-14 ENCOUNTER — Inpatient Hospital Stay (HOSPITAL_COMMUNITY): Payer: Medicare PPO

## 2023-02-14 DIAGNOSIS — W19XXXA Unspecified fall, initial encounter: Secondary | ICD-10-CM | POA: Diagnosis not present

## 2023-02-14 DIAGNOSIS — R509 Fever, unspecified: Secondary | ICD-10-CM

## 2023-02-14 LAB — CBC
HCT: 34.8 % — ABNORMAL LOW (ref 36.0–46.0)
Hemoglobin: 10.5 g/dL — ABNORMAL LOW (ref 12.0–15.0)
MCH: 28.7 pg (ref 26.0–34.0)
MCHC: 30.2 g/dL (ref 30.0–36.0)
MCV: 95.1 fL (ref 80.0–100.0)
Platelets: 251 10*3/uL (ref 150–400)
RBC: 3.66 MIL/uL — ABNORMAL LOW (ref 3.87–5.11)
RDW: 14.7 % (ref 11.5–15.5)
WBC: 9.1 10*3/uL (ref 4.0–10.5)
nRBC: 0 % (ref 0.0–0.2)

## 2023-02-14 LAB — BASIC METABOLIC PANEL
Anion gap: 8 (ref 5–15)
BUN: 20 mg/dL (ref 8–23)
CO2: 23 mmol/L (ref 22–32)
Calcium: 9.1 mg/dL (ref 8.9–10.3)
Chloride: 110 mmol/L (ref 98–111)
Creatinine, Ser: 0.85 mg/dL (ref 0.44–1.00)
GFR, Estimated: >60 mL/min (ref >60)
Glucose, Bld: 174 mg/dL — ABNORMAL HIGH (ref 70–99)
Potassium: 3.2 mmol/L — ABNORMAL LOW (ref 3.5–5.1)
Sodium: 141 mmol/L (ref 135–145)

## 2023-02-14 MED ORDER — POTASSIUM CHLORIDE CRYS ER 20 MEQ PO TBCR
40.0000 meq | EXTENDED_RELEASE_TABLET | Freq: Once | ORAL | Status: AC
  Administered 2023-02-14: 40 meq via ORAL
  Filled 2023-02-14: qty 2

## 2023-02-14 MED ORDER — FUROSEMIDE 40 MG PO TABS
40.0000 mg | ORAL_TABLET | Freq: Every day | ORAL | Status: DC
  Administered 2023-02-14 – 2023-02-16 (×3): 40 mg via ORAL
  Filled 2023-02-14 (×3): qty 1

## 2023-02-14 NOTE — Progress Notes (Addendum)
PROGRESS NOTE    Eileen Wilkerson  [id number] DOB: 05-30-1932 DOA: 02/12/2023 PCP: Mast, [name], NP   Brief Narrative: 89 year old with past medical history significant for rheumatoid arthritis on prednisone, GERD, history of gout, history of adrenal insufficiency who presents to the ED from friend's home after she fell on the morning of admission while trying to wipe herself after using bathroom.  Per daughter report patient had fallen 2 weeks prior to admission, she complaining of left lower abdominal pain, progressive left leg weakness.  In the ED she was noted to be mild febrile, lactic acid 2.6, leukocytosis.  She received IV fluids and is started on antibiotics.   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  UA negative for infection. Urine culture no growth to date.  Blood culture no growth to date.  Chest x ray no acute finding.  CT abdomen and pelvis: No acute or inflammatory process. Plan to discontinue IV antibiotics.    2-Acute Metabolic Encephalopathy: Probably related to Acute illness, tramadol contributing.  CT head without acute findings Delirium precaution.  Tramadol dose reduced  3-L2-L3 disc herniation, left hip pain, worsening progressive weakness over the last 2 weeks. MRI completed, neurosurgery consulted.  Recommended IV steroids.  Physical therapist evaluation. Patient is not a very good candidate for surgery  4-AKI: Treated with IV fluids. Resume oral Lasix  5-Chronic heart failure preserved ejection fraction: Will resume oral Lasix today  Long Term current use of systemic steroid for rheumatoid arthritis: Decadron ordered by  neurosurgery.  She will need taper back to her home dose prednisone.  Rheumatoid arthritis: Previously on methotrexate. Follow-up with Dr. Corena Pilgrim as an outpatient  Hypokalemia:  Replace orally.    Estimated body mass index is 32.9 kg/m as calculated from the following:   Height as of this encounter: '5\' 2"'$  (1.575 m).   Weight as of this encounter: 81.6 kg.   DVT prophylaxis: Lovenox Code Status: Full code Family Communication: Daughter over phone Disposition Plan:  Status is: Inpatient Remains inpatient appropriate because: management of back pain.     Consultants:  Neurosurgery   Procedures:  none  Antimicrobials:    Subjective: She is alert, report improvement of back pain.  No new complaints.    Objective: Vitals:   02/13/23 1947 02/14/23 0544 02/14/23 1226 02/14/23 1240  BP: (!) 128/57 (!) 144/76  (!) 156/81  Pulse: 80 68 (!) 107 76  Resp: '17 16  18  '$ Temp: 98.1 F (36.7 C) 97.9 F (36.6 C)  98.1 F (36.7 C)  TempSrc: Oral Oral  Oral  SpO2: 96% 95%  96%  Weight:      Height:        Intake/Output Summary (Last 24 hours) at 02/14/2023 1522 Last data filed at 02/14/2023 1221 Gross per 24 hour  Intake 1037 ml  Output 300 ml  Net 737 ml   Filed Weights   02/12/23 1039  Weight: 81.6 kg    Examination:  General exam: Appears calm and comfortable  Respiratory system: Clear to auscultation. Respiratory effort normal. Cardiovascular system: S1 & S2 heard, RRR. No JVD, murmurs, rubs, gallops or clicks. No pedal  edema. Gastrointestinal system: Abdomen is nondistended, soft and nontender. No organomegaly or masses felt. Normal bowel sounds heard. Central nervous system: Alert and oriented.  Extremities: Moves BL LE    Data Reviewed: I have personally reviewed following labs and imaging studies  CBC: Recent Labs  Lab 02/12/23 1120 02/13/23 0435 02/14/23 0420  WBC 15.7* 12.4* 9.1  NEUTROABS 10.8*  --   --   HGB 13.7 11.4* 10.5*  HCT 44.7 38.5  34.8*  MCV 96.5 97.2 95.1  PLT 278 250 [id number]   Basic Metabolic Panel: Recent Labs  Lab 02/12/23 1120 02/13/23 0435 02/14/23 0420  NA 139 140 141  K 4.1 3.4* 3.2*  CL 99 104 110  CO2 '27 25 23  '$ GLUCOSE 125* 104* 174*  BUN '17 14 20  '$ CREATININE 1.09* 0.88 0.85  CALCIUM 9.4 9.2 9.1   GFR: Estimated Creatinine Clearance: 45.3 mL/min (by C-G formula based on SCr of 0.85 mg/dL). Liver Function Tests: Recent Labs  Lab 02/12/23 1120  AST 26  ALT 15  ALKPHOS 61  BILITOT 1.4*  PROT 7.9  ALBUMIN 3.8   No results for input(s): "LIPASE", "AMYLASE" in the last 168 hours. No results for input(s): "AMMONIA" in the last 168 hours. Coagulation Profile: Recent Labs  Lab 02/12/23 1120  INR 1.1   Cardiac Enzymes: No results for input(s): "CKTOTAL", "CKMB", "CKMBINDEX", "TROPONINI" in the last 168 hours. BNP (last 3 results) No results for input(s): "PROBNP" in the last 8760 hours. HbA1C: No results for input(s): "HGBA1C" in the last 72 hours. CBG: No results for input(s): "GLUCAP" in the last 168 hours. Lipid Profile: No results for input(s): "CHOL", "HDL", "LDLCALC", "TRIG", "CHOLHDL", "LDLDIRECT" in the last 72 hours. Thyroid Function Tests: No results for input(s): "TSH", "T4TOTAL", "FREET4", "T3FREE", "THYROIDAB" in the last 72 hours. Anemia Panel: No results for input(s): "VITAMINB12", "FOLATE", "FERRITIN", "TIBC", "IRON", "RETICCTPCT" in the last 72 hours. Sepsis Labs: Recent Labs  Lab 02/12/23 1046 02/12/23 1307 02/12/23 1707 02/12/23 1908  LATICACIDVEN 2.3* 2.6* 1.4 1.3    Recent Results (from the past 240 hour(s))  Blood Culture (routine x 2)     Status: None (Preliminary result)   Collection Time: 02/12/23 11:20 AM   Specimen: BLOOD RIGHT ARM  Result Value Ref Range Status   Specimen Description   Final    BLOOD RIGHT ARM Performed at [facility] [address], [place], [place] [address]    Special Requests   Final    BOTTLES DRAWN  AEROBIC AND ANAEROBIC Blood Culture adequate volume Performed at [facility] [address] [place], [place] [address]    Culture   Final    NO GROWTH 2 DAYS Performed at [facility] [address], [place], [place] [address]    Report Status PENDING  Incomplete  Resp panel by RT-PCR (RSV, Flu A&B, Covid) Peripheral     Status: None   Collection Time: 02/12/23 11:21 AM   Specimen: Peripheral; Nasal Swab  Result Value Ref Range Status   SARS Coronavirus 2 by RT PCR NEGATIVE NEGATIVE Final    Comment: (NOTE) SARS-CoV-2 target nucleic acids are NOT DETECTED.  The SARS-CoV-2 RNA is generally detectable in upper respiratory specimens during the acute phase of infection. The lowest concentration of SARS-CoV-2 viral copies this assay can detect is 138 copies/mL. A negative result does not preclude SARS-Cov-2 infection and should not be used as the sole basis for treatment or other patient management decisions. A negative  result may occur with  improper specimen collection/handling, submission of specimen other than nasopharyngeal swab, presence of viral mutation(s) within the areas targeted by this assay, and inadequate number of viral copies(<138 copies/mL). A negative result must be combined with clinical observations, patient history, and epidemiological information. The expected result is Negative.  Fact Sheet for Patients:  EntrepreneurPulse.com.au  Fact Sheet for Healthcare Providers:  IncredibleEmployment.be  This test is no t yet approved or cleared by the [place] FDA and  has been authorized for detection and/or diagnosis of SARS-CoV-2 by FDA under an Emergency Use Authorization (EUA). This EUA will remain  in effect (meaning this test can be used) for the duration of the COVID-19 declaration under Section 564(b)(1) of the Act, 21 U.S.C.section 360bbb-3(b)(1), unless the authorization is terminated  or  revoked sooner.       Influenza A by PCR NEGATIVE NEGATIVE Final   Influenza B by PCR NEGATIVE NEGATIVE Final    Comment: (NOTE) The Xpert Xpress SARS-CoV-2/FLU/RSV plus assay is intended as an aid in the diagnosis of influenza from Nasopharyngeal swab specimens and should not be used as a sole basis for treatment. Nasal washings and aspirates are unacceptable for Xpert Xpress SARS-CoV-2/FLU/RSV testing.  Fact Sheet for Patients: EntrepreneurPulse.com.au  Fact Sheet for Healthcare Providers: IncredibleEmployment.be  This test is not yet approved or cleared by the [place] FDA and has been authorized for detection and/or diagnosis of SARS-CoV-2 by FDA under an Emergency Use Authorization (EUA). This EUA will remain in effect (meaning this test can be used) for the duration of the COVID-19 declaration under Section 564(b)(1) of the Act, 21 U.S.C. section 360bbb-3(b)(1), unless the authorization is terminated or revoked.     Resp Syncytial Virus by PCR NEGATIVE NEGATIVE Final    Comment: (NOTE) Fact Sheet for Patients: EntrepreneurPulse.com.au  Fact Sheet for Healthcare Providers: IncredibleEmployment.be  This test is not yet approved or cleared by the [place] FDA and has been authorized for detection and/or diagnosis of SARS-CoV-2 by FDA under an Emergency Use Authorization (EUA). This EUA will remain in effect (meaning this test can be used) for the duration of the COVID-19 declaration under Section 564(b)(1) of the Act, 21 U.S.C. section 360bbb-3(b)(1), unless the authorization is terminated or revoked.  Performed at [facility], [place] [address], [place], [place] [address]   Blood Culture (routine x 2)     Status: None (Preliminary result)   Collection Time: 02/12/23 11:34 AM   Specimen: BLOOD  Result Value Ref Range Status   Specimen Description   Final    BLOOD  BLOOD LEFT ARM Performed at [facility] [address], [place], [place] [address]    Special Requests   Final    BOTTLES DRAWN AEROBIC AND ANAEROBIC Blood Culture results may not be optimal due to an inadequate volume of blood received in culture bottles Performed at [facility] [address], [place], [place] [address]    Culture   Final    NO GROWTH 2 DAYS Performed at [facility] [address] [place], [place] [address]    Report Status PENDING  Incomplete  MRSA Next Gen by PCR, Nasal     Status: None   Collection Time: 02/12/23  5:13 PM   Specimen: Nasal Mucosa; Nasal Swab  Result Value Ref Range Status   MRSA by PCR Next Gen NOT DETECTED NOT DETECTED Final    Comment: (NOTE) The GeneXpert MRSA Assay (FDA approved  for NASAL specimens only), is one component of a comprehensive MRSA colonization surveillance program. It is not intended to diagnose MRSA infection nor to guide or monitor treatment for MRSA infections. Test performance is not FDA approved in patients less than 52 years old. Performed at [facility], [place] [address] [place], [place] [address]          Radiology Studies: MR LUMBAR SPINE WO CONTRAST  Result Date: 02/13/2023 CLINICAL DATA:  Lumbar radiculopathy EXAM: MRI LUMBAR SPINE WITHOUT CONTRAST TECHNIQUE: Multiplanar, multisequence MR imaging of the lumbar spine was performed. No intravenous contrast was administered. COMPARISON:  09/09/2019 FINDINGS: Evaluation is limited by motion. Segmentation: 5 lumbar type vertebral bodies. Alignment: Trace anterolisthesis of L5 on S1, unchanged. Mild levocurvature of the upper lumbar spine, with compensatory dextrocurvature of the lower lumbar spine. Vertebrae:  No fracture, evidence of discitis, or bone lesion. Conus medullaris and cauda equina: Conus extends to the L1 level. Conus and cauda equina appear normal. Paraspinal and other soft tissues:  Renal cysts, for which no follow-up is currently indicated. Atrophy of the paraspinous musculature. Disc levels: T12-L1: Disc height loss and mild disc bulge. Mild facet arthropathy. Mild spinal canal stenosis and moderate right neural foraminal narrowing, unchanged. L1-L2: Disc height loss and mild disc bulge moderate facet arthropathy. Ligamentum flavum hypertrophy. Mild-to-moderate spinal canal stenosis, unchanged. Narrowing of the lateral recesses. No neural foraminal narrowing. L2-L3: Disc height loss and minimal disc bulge with superimposed left subarticular disc extrusion with 13 mm caudal migration (series 5, image 11), which appears new from the prior exam. This contacts the descending left L3 nerve roots. Mild spinal canal stenosis. No significant neural foraminal narrowing. L3-L4: Prior fusion with interbody disc spacer. Mild facet arthropathy. Ligamentum flavum hypertrophy. Narrowing of the lateral recesses. Mild spinal canal stenosis and mild bilateral neural foraminal narrowing, unchanged. L4-L5: Prior fusion with interbody disc spacer. Mild facet arthropathy. Narrowing of the lateral recesses. No spinal canal stenosis. Mild bilateral neural foraminal narrowing, unchanged. L5-S1: Trace anterolisthesis with disc unroofing and small central disc protrusion. Moderate facet arthropathy. No spinal canal stenosis or neural foraminal narrowing. IMPRESSION: 1. Evaluation is limited by motion. Within this limitation, there is a new left subarticular disc extrusion at L2-L3 with caudal migration, which contacts the descending left L3 nerve roots. 2. L1-L2 mild-to-moderate spinal canal stenosis, unchanged. 3. T12-L1 mild spinal canal stenosis and moderate right neural foraminal narrowing, unchanged. 4. L3-L4 mild spinal canal stenosis and mild bilateral neural foraminal narrowing, unchanged. L4-L5 mild bilateral neural foraminal narrowing, unchanged. 5. Narrowing of the lateral recesses at L1-L2, L3-L4, and L4-L5  could affect the descending L2, L4, and L5 nerve roots, respectively. Electronically Signed   By: Merilyn Baba M.D.   On: 02/13/2023 02:45   CT HEAD WO CONTRAST (5MM)  Result Date: 02/12/2023 CLINICAL DATA:  Neuro deficit, acute, stroke suspected EXAM: CT HEAD WITHOUT CONTRAST TECHNIQUE: Contiguous axial images were obtained from the base of the skull through the vertex without intravenous contrast. RADIATION DOSE REDUCTION: This exam was performed according to the departmental dose-optimization program which includes automated exposure control, adjustment of the mA and/or kV according to patient size and/or use of iterative reconstruction technique. COMPARISON:  None Available. FINDINGS: Brain: No evidence of acute large vascular territory infarction, hemorrhage, hydrocephalus, extra-axial collection or mass lesion/mass effect. Moderate patchy white matter hypodensities, nonspecific but compatible with chronic microvascular ischemic disease. Cerebral atrophy Vascular: No hyperdense vessel. Skull: No acute fracture. Sinuses/Orbits: Mild paranasal sinus mucosal thickening. No  acute orbital findings. Other: No mastoid effusions. IMPRESSION: 1. No evidence of acute intracranial abnormality. 2. Moderate chronic microvascular ischemic disease. Electronically Signed   By: Margaretha Sheffield M.D.   On: 02/12/2023 17:54        Scheduled Meds:  atorvastatin  20 mg Oral Daily   cholecalciferol  5,000 Units Oral Daily   colchicine  0.6 mg Oral Daily   cyanocobalamin  1,000 mcg Oral Daily   cycloSPORINE  1 drop Both Eyes BID   dexamethasone (DECADRON) injection  6 mg Intravenous Q12H   enoxaparin (LOVENOX) injection  40 mg Subcutaneous Q24H   metroNIDAZOLE  500 mg Oral Q12H   montelukast  10 mg Oral QHS   Continuous Infusions:  ceFEPime (MAXIPIME) IV 2 g (02/14/23 1040)     LOS: 2 days    Time spent: 35 minutes    Jacquis Paxton A Hiroshi Krummel, MD [facility]   If 7PM-7AM, please contact  night-coverage [email or web address]  02/14/2023, 3:22 PM

## 2023-02-14 NOTE — TOC Initial Note (Signed)
Transition of Care [facility]) - Initial/Assessment Note    Patient Details  Name: Eileen Wilkerson MRN: [id number] Date of Birth: 06-21-1932  Transition of [facility]) CM/SW Contact:    Illene Regulus, LCSW Phone Number: 02/14/2023, 3:59 PM  Clinical Narrative:                 CSW spoke with pt regarding SNF placement recommendation. Pt [facility]. Pt agreeable to rehab, she is requesting to go back to [facility] for [facility]hab. CSW explained the process and will work pt up for SNF placement. TOC to follow.   Expected Discharge Plan: Skilled Nursing Facility Barriers to Discharge: Continued Medical Work up   Patient Goals and CMS Choice Patient states their goals for this hospitalization and ongoing recovery are:: short term rehab placement CMS Medicare.gov Compare Post Acute Care list provided to:: Patient Choice offered to / list presented to : Patient      Expected Discharge Plan and Services       Living arrangements for the past 2 months: [place]                                      Prior Living Arrangements/Services Living arrangements for the past 2 months: [place] Lives with:: Self Patient language and need for interpreter reviewed:: Yes Do you feel safe going back to the place where you live?: Yes      Need for Family Participation in Patient Care: No (Comment) Care giver support system in place?: No (comment)   Criminal Activity/Legal Involvement Pertinent to Current Situation/Hospitalization: No - Comment as needed  Activities of Daily Living Home Assistive Devices/Equipment: Eyeglasses, Hearing aid, Wheelchair, Environmental consultant (specify type) (motorized wheelchair) ADL Screening (condition at time of admission) Patient's cognitive ability adequate to safely complete daily activities?: No (due to patient's condition) Is the patient deaf or have difficulty hearing?: Yes (patient wears hearing aids, but  does not have them with her) Does the patient have difficulty seeing, even when wearing glasses/contacts?: No (patient does wear glasses) Does the patient have difficulty concentrating, remembering, or making decisions?: No Patient able to express need for assistance with ADLs?: Yes Does the patient have difficulty dressing or bathing?: Yes (yes due to patient's condition) Independently performs ADLs?: No Communication: Independent Dressing (OT): Needs assistance Is this a change from baseline?: Change from baseline, expected to last >3 days Grooming: Independent Feeding: Independent Bathing: Needs assistance Is this a change from baseline?: Change from baseline, expected to last >3 days Toileting: Needs assistance Is this a change from baseline?: Change from baseline, expected to last >3days In/Out Bed: Needs assistance (due to patient's condition) Is this a change from baseline?: Change from baseline, expected to last >3 days Walks in Home: Needs assistance (uses a walker and a motorized wheelchair) Is this a change from baseline?: Pre-admission baseline Does the patient have difficulty walking or climbing stairs?: Yes Weakness of Legs: Both Weakness of Arms/Hands: Both  Permission Sought/Granted                  Emotional Assessment Appearance:: Appears stated age Attitude/Demeanor/Rapport: Gracious Affect (typically observed): Accepting Orientation: : Oriented to Self, Oriented to Place, Oriented to  Time, Oriented to Situation   Psych Involvement: No (comment)  Admission diagnosis:  Fever of unknown origin [R50.9] SIRS with acute organ dysfunction due to infectious process [facility]) [  A41.9, R65.20] Fall, initial encounter [id number]] Sepsis without acute organ dysfunction, due to unspecified organism [facility]) [A41.9] Patient Active Problem List   Diagnosis Date Noted   Acute metabolic encephalopathy [id number]   Radiculitis due to herniation of intervertebral disc of lumbar  spine 02/12/2023   Urinary frequency 02/08/2023   Left hip pain 02/08/2023   Chronic diastolic heart failure ([place]) 11/06/2022   Gout 11/06/2022   Sepsis secondary to UTI ([place]) 10/27/2022   DVT (deep venous thrombosis) ([place]) 05/26/2022   E coli bacteremia    Sepsis ([place]) 05/21/2022   Diarrhea 05/21/2022   Gait abnormality 01/26/2022   Sinus tachycardia 10/27/2021   Weight gain 10/14/2021   Hyperlipidemia 10/13/2021   Dysuria 06/23/2021   Osteopenia 06/07/2021   Adnexal cyst 03/03/2021   AK (actinic keratosis) 03/03/2021   UTI (urinary tract infection) 07/22/2020   Allergic rhinitis 07/22/2020   Asthma 07/08/2020   Perforation of sigmoid colon due to diverticulitis 06/23/2020   Hair loss 08/06/2019   SOB (shortness of breath) on exertion 08/06/2019   Pure hypercholesterolemia 08/06/2019   B12 deficiency 03/11/2018   AKI (acute kidney injury) ([place]) 02/14/2018   E. coli UTI 01/18/2018   Edema of both lower extremities due to peripheral venous insufficiency 09/18/2017   Bilateral lower extremity edema 06/30/2017   Physical deconditioning 05/30/2017   Immunosuppressed status ([place]) 05/30/2017   Abnormal chest x-ray 05/17/2017   Hypokalemia 04/25/2017   Fibromyalgia 12/18/2016   Spondylosis of lumbar region without myelopathy or radiculopathy 12/18/2016   Trigger finger, right middle finger 12/18/2016   Primary osteoarthritis of both feet 12/18/2016   Primary osteoarthritis of both knees 12/18/2016   GERD (gastroesophageal reflux disease) 12/18/2016   Age-related osteoporosis without current pathological fracture 12/18/2016   Rheumatoid arthritis ([place]) 12/28/2015   Long term current use of systemic steroids, for RA 12/28/2015   Lymphocytosis 11/13/2012   PCP:  Mast, [name], NP Pharmacy:   [id number] [address]Phone: [phone]: [address]Phone: [phone]: [address]Phone: [phone]: [phone]    Social Determinants of Health (SDOH) Social History: SDOH Screenings   Food Insecurity: No Food Insecurity (02/12/2023)  Housing: Low Risk  (02/12/2023)  Transportation Needs: No Transportation Needs (02/12/2023)  Utilities: Not At Risk (02/12/2023)  Depression (PHQ2-9): Low Risk  (01/03/2023)  Tobacco Use: Medium Risk (02/12/2023)   SDOH Interventions:     Readmission Risk Interventions    05/30/2022   10:08 AM  Readmission Risk Prevention Plan  Transportation Screening Complete  PCP or Specialist Appt within 5-7 Days Complete  Home Care Screening Complete  Medication Review (RN CM) Complete

## 2023-02-14 NOTE — Evaluation (Signed)
Occupational Therapy Evaluation Patient Details Name: Eileen Wilkerson MRN: [id number] DOB: [birth year] Today's Date: 02/14/2023   History of Present Illness 89 yo female admitted with sepsis. Also found to have L L2-L3 disc extrusion. Neurology consulted-conservative management for now since pt has sepsis. Hx of RA, OA, fibromyalgia, osteoporosis, recurrent UTI, asthma, diverticulitis, adrenal failure.   Clinical Impression   Patient is currently requiring assistance with ADLs including up to minimal assist with Lower body ADLs, minimal assist with Upper body ADLs,  as well as  supervision with bed mobility and Min guard assist with functional transfers to toilet.  Current level of function is below patient's typical baseline, especially in regards to pt's perceived baseline memory.  During this evaluation, patient was limited by memory deficits which pt says is new, generalized weakness, impaired activity tolerance, and LT flank pain, all of which has the potential to impact patient's safety and independence during functional mobility, as well as performance for ADLs.  Patient lives alone at [place], and has a CNA who is able to provide some supervision and assistance 2x/week for about 3 hours a day.  Pt stated that she will see if her CNA can come more frequently.   Patient demonstrates fair to good rehab potential, and should benefit from continued skilled occupational therapy services while in acute care to maximize safety, independence and quality of life at home.  Continued occupational therapy services in a SNF setting at [facility] recommended.  ?    Recommendations for follow up therapy are one component of a multi-disciplinary discharge planning process, led by the attending physician.  Recommendations may be updated based on patient status, additional functional criteria and insurance authorization.   Follow Up Recommendations  Skilled nursing-short term rehab (<3 hours/day)      Assistance Recommended at Discharge Frequent or constant Supervision/Assistance  Patient can return home with the following A little help with walking and/or transfers;A little help with bathing/dressing/bathroom;Assistance with cooking/housework;Assist for transportation;Direct supervision/assist for financial management;Direct supervision/assist for medications management    Functional Status Assessment  Patient has had a recent decline in their functional status and demonstrates the ability to make significant improvements in function in a reasonable and predictable amount of time.  Equipment Recommendations  None recommended by OT    Recommendations for Other Services       Precautions / Restrictions Precautions Precautions: Fall Restrictions Weight Bearing Restrictions: No      Mobility Bed Mobility Overal bed mobility: Needs Assistance Bed Mobility: Supine to Sit, Sit to Supine     Supine to sit: Supervision, HOB elevated Sit to supine: Supervision, HOB elevated   General bed mobility comments: Pt has adjustable bed at home without rails.    Transfers                          Balance Overall balance assessment: Needs assistance, History of Falls   Sitting balance-Leahy Scale: Good     Standing balance support: Bilateral upper extremity supported, During functional activity, Reliant on assistive device for balance Standing balance-Leahy Scale: Poor                             ADL either performed or assessed with clinical judgement   ADL Overall ADL's : Needs assistance/impaired Eating/Feeding: Set up;Bed level   Grooming: Sitting;Brushing hair;Minimal assistance Grooming Details (indicate cue type and reason): Pt ambulated to bathroom sink  for grooming but LT flank pain limited pt's tolerance to standing and pt sat on BSC to use mirror. Min As needed to brush back of head.     Lower Body Bathing: Sitting/lateral leans;Min guard    Upper Body Dressing : Min guard;Sitting   Lower Body Dressing: Set up;Min guard;Sitting/lateral leans Lower Body Dressing Details (indicate cue type and reason): Pt able to don and doffo socks at EOB with use of figure 4 positioning and Min Guard at trunk for safety. Toilet Transfer: BSC/3in1;Ambulation;Rolling walker (2 wheels);Min guard     Toileting - Clothing Manipulation Details (indicate cue type and reason): Pt using pure wick. Bladder urgency whenever standing. Pt stood at EOB and voided into pure wick then pure wick removed for pt to ambulate. Pt reports that she uses pads at baseline.     Functional mobility during ADLs: Supervision/safety;Min guard;Cueing for safety;Rolling walker (2 wheels)       Vision   Vision Assessment?: No apparent visual deficits     Perception     Praxis      Pertinent Vitals/Pain Pain Assessment Pain Assessment: Faces Faces Pain Scale: Hurts little more Pain Location: Denied pain at rest.  Does not recall abdominal or foot pain from working with PT yesterday.  Once up with OT W/B of 4/10 to LT upper hip/waist area. Pain Descriptors / Indicators: Other (Comment) ("Pulling") Pain Intervention(s): Limited activity within patient's tolerance, Monitored during session, Repositioned     Hand Dominance Right   Extremity/Trunk Assessment Upper Extremity Assessment Upper Extremity Assessment: Generalized weakness   Lower Extremity Assessment Lower Extremity Assessment: Generalized weakness   Cervical / Trunk Assessment Cervical / Trunk Assessment: Normal   Communication Communication Communication: HOH   Cognition     Overall Cognitive Status: Impaired/Different from baseline Area of Impairment: Memory, Orientation, Awareness                 Orientation Level: Situation (Reports fall as reason for admission then stated that she has not fallen in 2 years. Then repeated that she had a fall before coming to hospital.)   Memory:  Decreased short-term memory     Awareness: Emergent   General Comments: Pt completed short Blessed Test with score of 1 (1 mistake) which supports healthy cognition, however when pt is giving functional information she is struggling with recall and denies getting up with physical therapy yesterday     General Comments       Exercises     Shoulder Instructions      Home Living Family/patient expects to be discharged to:: Private residence Living Arrangements: Alone Available Help at Discharge: Family;Personal care attendant;Available PRN/intermittently Type of Home: Independent living facility Home Access: Level entry     Home Layout: One level     Bathroom Shower/Tub: Occupational psychologist: Handicapped height     Home Equipment: Rollator (4 wheels);Wheelchair - manual;Shower seat;Hand held shower head;Adaptive equipment Adaptive Equipment: Reacher Additional Comments: Adjustable bed without rails      Prior Functioning/Environment Prior Level of Function : Needs assist  Cognitive Assist : ADLs (cognitive) (CNA fills pt's medication pill box for pt)   ADLs (Cognitive): Set up cues       Mobility Comments: uses rollator for household distances, wheelchair for longer distances ADLs Comments: performs ADLs unassisted. help for homemaking, meals, transportation. Pt reports that she has CNA 2x/week who does her laundry and light housekeeping. Pt reports, "I guess I can still drive" but has been  using transportation from [place].        OT Problem List: Decreased strength;Pain;Decreased range of motion;Impaired UE functional use;Decreased activity tolerance;Decreased cognition (RA deformity to RT hand)      OT Treatment/Interventions: Self-care/ADL training;Therapeutic activities;Therapeutic exercise;Cognitive remediation/compensation;DME and/or AE instruction;Patient/family education;Balance training    OT Goals(Current goals can be found in the care plan  section) Acute Rehab OT Goals Patient Stated Goal: To improve memory and to protect hands from further RA deformities. OT Goal Formulation: With patient Time For Goal Achievement: 02/28/23 Potential to Achieve Goals: Fair ADL Goals Pt Will Perform Lower Body Bathing: sitting/lateral leans;sit to/from stand;with modified independence;with adaptive equipment Pt Will Transfer to Toilet: ambulating;with modified independence;grab bars Pt Will Perform Toileting - Clothing Manipulation and hygiene: with modified independence;sitting/lateral leans;sit to/from stand Pt Will Perform Tub/Shower Transfer: Shower transfer;with supervision;grab bars Pt/caregiver will Perform Home Exercise Program: Increased ROM;Increased strength;Both right and left upper extremity;With Supervision Additional ADL Goal #1: Pt will identify at least 3 joint protection techniques for RA with help from Joint protection handout as needed. (3/6-handout not yet issued)  OT Frequency: Min 2X/week    Co-evaluation              AM-PAC OT "6 Clicks" Daily Activity     Outcome Measure Help from another person eating meals?: None Help from another person taking care of personal grooming?: A Little Help from another person toileting, which includes using toliet, bedpan, or urinal?: A Little Help from another person bathing (including washing, rinsing, drying)?: A Little Help from another person to put on and taking off regular upper body clothing?: A Little Help from another person to put on and taking off regular lower body clothing?: A Little 6 Click Score: 19   End of Session Equipment Utilized During Treatment: Gait belt;Rolling walker (2 wheels) Nurse Communication: Other (comment) (Pain location)  Activity Tolerance: Patient limited by pain Patient left: in bed;with call bell/phone within reach;with bed alarm set  OT Visit Diagnosis: Pain;History of falling (Z91.81);Unsteadiness on feet (R26.81);Other symptoms and  signs involving cognitive function;Muscle weakness (generalized) (M62.81) Pain - Right/Left: Left Pain - part of body:  (Waist)                Time: [id number] OT Time Calculation (min): 44 min Charges:  OT General Charges $OT Visit: 1 Visit OT Evaluation $OT Eval Low Complexity: 1 Low OT Treatments $Self Care/Home Management : 8-22 mins $Therapeutic Activity: 8-22 mins  Anderson Malta, [place] Office: [phone] 02/14/2023  Julien Girt 02/14/2023, 12:43 PM

## 2023-02-14 NOTE — Plan of Care (Signed)
  Problem: Education: Goal: Knowledge of General Education information will improve Description: Including pain rating scale, medication(s)/side effects and non-pharmacologic comfort measures Outcome: Progressing   Problem: Activity: Goal: Risk for activity intolerance will decrease Outcome: Progressing   Problem: Nutrition: Goal: Adequate nutrition will be maintained Outcome: Progressing   Problem: Coping: Goal: Level of anxiety will decrease Outcome: Progressing   

## 2023-02-15 ENCOUNTER — Other Ambulatory Visit: Payer: Medicare PPO

## 2023-02-15 DIAGNOSIS — W19XXXA Unspecified fall, initial encounter: Secondary | ICD-10-CM | POA: Diagnosis not present

## 2023-02-15 DIAGNOSIS — R509 Fever, unspecified: Secondary | ICD-10-CM | POA: Diagnosis not present

## 2023-02-15 LAB — GLUCOSE, CAPILLARY
Glucose-Capillary: 199 mg/dL — ABNORMAL HIGH (ref 70–99)
Glucose-Capillary: 197 mg/dL — ABNORMAL HIGH (ref 70–99)

## 2023-02-15 MED ORDER — METHYLPREDNISOLONE 4 MG PO TBPK
4.0000 mg | ORAL_TABLET | ORAL | Status: AC
  Administered 2023-02-15: 4 mg via ORAL

## 2023-02-15 MED ORDER — INSULIN ASPART 100 UNIT/ML IJ SOLN
0.0000 [IU] | Freq: Three times a day (TID) | INTRAMUSCULAR | Status: DC
  Administered 2023-02-15: 1 [IU] via SUBCUTANEOUS

## 2023-02-15 MED ORDER — METHYLPREDNISOLONE 4 MG PO TBPK
8.0000 mg | ORAL_TABLET | Freq: Every morning | ORAL | Status: AC
  Administered 2023-02-15: 8 mg via ORAL
  Filled 2023-02-15: qty 21

## 2023-02-15 MED ORDER — METHYLPREDNISOLONE 4 MG PO TBPK
4.0000 mg | ORAL_TABLET | Freq: Three times a day (TID) | ORAL | Status: DC
  Administered 2023-02-16 (×2): 4 mg via ORAL

## 2023-02-15 MED ORDER — METHYLPREDNISOLONE 4 MG PO TBPK: 8.0000 mg | ORAL_TABLET | Freq: Every evening | ORAL | Status: DC

## 2023-02-15 MED ORDER — METHYLPREDNISOLONE 4 MG PO TBPK: 4.0000 mg | ORAL_TABLET | ORAL | Status: AC

## 2023-02-15 MED ORDER — AMLODIPINE BESYLATE 10 MG PO TABS
5.0000 mg | ORAL_TABLET | Freq: Every day | ORAL | Status: DC
  Administered 2023-02-15 – 2023-02-16 (×2): 5 mg via ORAL
  Filled 2023-02-15 (×2): qty 1

## 2023-02-15 MED ORDER — ORAL CARE MOUTH RINSE: 15.0000 mL | OROMUCOSAL | Status: DC | PRN

## 2023-02-15 MED ORDER — METHYLPREDNISOLONE 4 MG PO TBPK
8.0000 mg | ORAL_TABLET | Freq: Every evening | ORAL | Status: AC
  Administered 2023-02-15: 8 mg via ORAL

## 2023-02-15 MED ORDER — METHYLPREDNISOLONE 4 MG PO TBPK: 4.0000 mg | ORAL_TABLET | Freq: Four times a day (QID) | ORAL | Status: DC

## 2023-02-15 NOTE — Progress Notes (Signed)
Physical Therapy Treatment Patient Details Name: Eileen Wilkerson MRN: [id number] DOB: [birth year] Today's Date: 02/15/2023   History of Present Illness 89 yo female admitted with sepsis. Also found to have L L2-L3 disc extrusion. Neurology consulted-conservative management for now since pt has sepsis. Hx of RA, OA, fibromyalgia, osteoporosis, recurrent UTI, asthma, diverticulitis, adrenal failure.    PT Comments    Pt tolerated increased ambulation distance of 30' with RW, distance limited by L flank pain and fatigue. Pt's bed was quite saturated in urine upon my arrival, purewick was in place. Assisted pt with gown change and pericare. RN/NT notified of need for linen change.    Recommendations for follow up therapy are one component of a multi-disciplinary discharge planning process, led by the attending physician.  Recommendations may be updated based on patient status, additional functional criteria and insurance authorization.  Follow Up Recommendations  Skilled nursing-short term rehab (<3 hours/day) Can patient physically be transported by private vehicle: Yes   Assistance Recommended at Discharge Intermittent Supervision/Assistance  Patient can return home with the following Assistance with cooking/housework;Assist for transportation;Help with stairs or ramp for entrance;A little help with bathing/dressing/bathroom;A little help with walking and/or transfers   Equipment Recommendations  None recommended by PT    Recommendations for Other Services       Precautions / Restrictions Precautions Precautions: Fall Restrictions Weight Bearing Restrictions: No     Mobility  Bed Mobility Overal bed mobility: Modified Independent Bed Mobility: Supine to Sit     Supine to sit: Modified independent (Device/Increase time), HOB elevated     General bed mobility comments: bed saturated in urine, RN/NT notified, assisted pt with pericare and gown change    Transfers Overall  transfer level: Needs assistance Equipment used: Rolling walker (2 wheels) Transfers: Sit to/from Stand Sit to Stand: Min guard           General transfer comment: min guard for safety and VCs for hand placement    Ambulation/Gait Ambulation/Gait assistance: Min guard Gait Distance (Feet): 30 Feet Assistive device: Rolling walker (2 wheels) Gait Pattern/deviations: Decreased step length - right, Decreased step length - left, Antalgic Gait velocity: decr     General Gait Details: distance limited by L flank pain and fatigue   Stairs             Wheelchair Mobility    Modified Rankin (Stroke Patients Only)       Balance Overall balance assessment: Needs assistance, History of Falls   Sitting balance-Leahy Scale: Good     Standing balance support: Bilateral upper extremity supported, During functional activity, Reliant on assistive device for balance Standing balance-Leahy Scale: Poor                              Cognition Arousal/Alertness: Awake/alert Behavior During Therapy: WFL for tasks assessed/performed Overall Cognitive Status: Within Functional Limits for tasks assessed                                 General Comments: some decreased STM        Exercises      General Comments General comments (skin integrity, edema, etc.): Provided orthosis for right hand for comfort. adjusted straps to fit. Educated pt on donnin/doffing and wearing schedule as well as purpose of orthosis. Handout provided as well.      Pertinent Vitals/Pain Pain Assessment Pain  Score: 6  Pain Location: L flank Pain Descriptors / Indicators: Aching, Discomfort Pain Intervention(s): Limited activity within patient's tolerance, Monitored during session, Patient requesting pain meds-RN notified    Home Living                          Prior Function            PT Goals (current goals can now be found in the care plan section) Acute  Rehab PT Goals Patient Stated Goal: less pain. regain independence PT Goal Formulation: With patient Time For Goal Achievement: 02/27/23 Potential to Achieve Goals: Good Progress towards PT goals: Progressing toward goals    Frequency    Min 2X/week      PT Plan Current plan remains appropriate    Co-evaluation              AM-PAC PT "6 Clicks" Mobility   Outcome Measure  Help needed turning from your back to your side while in a flat bed without using bedrails?: A Little Help needed moving from lying on your back to sitting on the side of a flat bed without using bedrails?: A Little Help needed moving to and from a bed to a chair (including a wheelchair)?: A Little Help needed standing up from a chair using your arms (e.g., wheelchair or bedside chair)?: A Little Help needed to walk in hospital room?: A Little Help needed climbing 3-5 steps with a railing? : A Lot 6 Click Score: 17    End of Session Equipment Utilized During Treatment: Gait belt Activity Tolerance: Patient limited by fatigue;Patient limited by pain Patient left: in chair;with call bell/phone within reach;with chair alarm set Nurse Communication: Mobility status;Other (comment) (bed saturated in urine) PT Visit Diagnosis: Pain;Muscle weakness (generalized) (M62.81);Difficulty in walking, not elsewhere classified (R26.2)     Time: [id number] PT Time Calculation (min) (ACUTE ONLY): 19 min  Charges:  $Gait Training: 8-22 mins                     Blondell Reveal Kistler PT 02/15/2023  Acute Rehabilitation Services  Office [phone]

## 2023-02-15 NOTE — Plan of Care (Signed)

## 2023-02-15 NOTE — Care Management Important Message (Signed)
Important Message  Patient Details IM Letter given. Name: Eileen Wilkerson MRN: [id number] Date of Birth: 25-May-1932   Medicare Important Message Given:  Yes     Kerin Salen 02/15/2023, 10:27 AM

## 2023-02-15 NOTE — NC FL2 (Signed)
[place] LEVEL OF CARE FORM     IDENTIFICATION  Patient Name: Eileen Wilkerson Birthdate: Apr 16, 1932 Sex: female Admission Date (Current Location): 02/12/2023  [facility] and [place] Number:  Herbalist and Address:  [facility],  [place] [address], [place]      Provider Number: [id number]  Attending Physician Name and Address:  Elmarie Shiley, MD  Relative Name and Phone Number:  Lendon Ka (Daughter) [phone] (Mobile)    Current Level of Care:   Recommended Level of Care: [place] Prior Approval Number:    Date Approved/Denied:   PASRR Number: [id number]  Discharge Plan: SNF    Current Diagnoses: Patient Active Problem List   Diagnosis Date Noted   Acute metabolic encephalopathy [id number]   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 02/12/2023   Urinary frequency 02/08/2023   Left hip pain 02/08/2023   Chronic diastolic heart failure ([place]) 11/06/2022   Gout 11/06/2022   Sepsis secondary to UTI ([place]) 10/27/2022   DVT (deep venous thrombosis) ([place]) 05/26/2022   E coli bacteremia    Sepsis ([place]) 05/21/2022   Diarrhea 05/21/2022   Gait abnormality 01/26/2022   Sinus tachycardia 10/27/2021   Weight gain 10/14/2021   Hyperlipidemia 10/13/2021   Dysuria 06/23/2021   Osteopenia 06/07/2021   Adnexal cyst 03/03/2021   AK (actinic keratosis) 03/03/2021   UTI (urinary tract infection) 07/22/2020   Allergic rhinitis 07/22/2020   Asthma 07/08/2020   Perforation of sigmoid colon due to diverticulitis 06/23/2020   Hair loss 08/06/2019   SOB (shortness of breath) on exertion 08/06/2019   Pure hypercholesterolemia 08/06/2019   B12 deficiency 03/11/2018   AKI (acute kidney injury) ([place]) 02/14/2018   E. coli UTI 01/18/2018   Edema of both lower extremities due to peripheral venous insufficiency 09/18/2017   Bilateral lower extremity edema 06/30/2017   Physical  deconditioning 05/30/2017   Immunosuppressed status ([place]) 05/30/2017   Abnormal chest x-ray 05/17/2017   Hypokalemia 04/25/2017   Fibromyalgia 12/18/2016   Spondylosis of lumbar region without myelopathy or radiculopathy 12/18/2016   Trigger finger, right middle finger 12/18/2016   Primary osteoarthritis of both feet 12/18/2016   Primary osteoarthritis of both knees 12/18/2016   GERD (gastroesophageal reflux disease) 12/18/2016   Age-related osteoporosis without current pathological fracture 12/18/2016   Rheumatoid arthritis ([place]) 12/28/2015   Long term current use of systemic steroids, for RA 12/28/2015   Lymphocytosis 11/13/2012    Orientation RESPIRATION BLADDER Height & Weight     Self, Time, Situation, Place  Normal Incontinent Weight: 179 lb 14.3 oz (81.6 kg) Height:  '5\' 2"'$  (157.5 cm)  BEHAVIORAL SYMPTOMS/MOOD NEUROLOGICAL BOWEL NUTRITION STATUS      Continent Diet (regular)  AMBULATORY STATUS COMMUNICATION OF NEEDS Skin   Limited Assist Verbally Normal                       Personal Care Assistance Level of Assistance  Bathing, Feeding, Dressing Bathing Assistance: Limited assistance Feeding assistance: Independent Dressing Assistance: Limited assistance     Functional Limitations Info  Sight, Hearing, Speech Sight Info: Adequate Hearing Info: Impaired (hard of hearing) Speech Info: Adequate    SPECIAL CARE FACTORS FREQUENCY  PT (By licensed PT), OT (By licensed OT)     PT Frequency: 5 x a week OT Frequency: 5 x a week            Contractures Contractures Info: Not present  Additional Factors Info  Code Status, Allergies Code Status Info: full Allergies Info: Adhesive (tape),Celebrex (celecoxib), Ciprofibrate, Cymbalta (duloxetine Hcl),Gabitril (tiagabine, Lyrica (pregabalin), Neurontin (gabapentin), Nexium (esomeprazole), NsaidsHigh, Shrimp (shellfish, Azactam (aztreonam) Azelastine Hcl, Ciprofloxacin, Claritin (loratadine), Methotrexate  ,Nasacort (triamcinolone), Olopatadine,Sulfamethizole,Zantac (ranitidine Hcl),Keflex (cephalexin),Claritin-d 12 Hour (loratadine-pseudoephedrine Er),Penicillins,Keflex (cephalexin)           Current Medications (02/15/2023):  This is the current hospital active medication list Current Facility-Administered Medications  Medication Dose Route Frequency Provider Last Rate Last Admin   acetaminophen (TYLENOL) tablet 650 mg  650 mg Oral Q6H PRN Agbata, Tochukwu, MD   650 mg at 02/13/23 1325   Or   acetaminophen (TYLENOL) suppository 650 mg  650 mg Rectal Q6H PRN Agbata, Tochukwu, MD       amLODipine (NORVASC) tablet 5 mg  5 mg Oral Daily Regalado, Belkys A, MD   5 mg at 02/15/23 1043   atorvastatin (LIPITOR) tablet 20 mg  20 mg Oral Daily Agbata, Tochukwu, MD   20 mg at 02/15/23 1043   cholecalciferol (VITAMIN D3) 25 MCG (1000 UNIT) tablet 5,000 Units  5,000 Units Oral Daily Agbata, Tochukwu, MD   5,000 Units at 02/15/23 1042   colchicine tablet 0.6 mg  0.6 mg Oral Daily Agbata, Tochukwu, MD   0.6 mg at 02/15/23 1043   cyanocobalamin (VITAMIN B12) tablet 1,000 mcg  1,000 mcg Oral Daily Agbata, Tochukwu, MD   1,000 mcg at 02/15/23 1043   cycloSPORINE (RESTASIS) 0.05 % ophthalmic emulsion 1 drop  1 drop Both Eyes BID Agbata, Tochukwu, MD   1 drop at 02/15/23 1043   dexamethasone (DECADRON) injection 6 mg  6 mg Intravenous Q12H Vance Gather B, MD   6 mg at 02/15/23 1043   enoxaparin (LOVENOX) injection 40 mg  40 mg Subcutaneous Q24H Agbata, Tochukwu, MD   40 mg at 02/14/23 2130   furosemide (LASIX) tablet 40 mg  40 mg Oral Daily Regalado, Belkys A, MD   40 mg at 02/15/23 1043   montelukast (SINGULAIR) tablet 10 mg  10 mg Oral QHS Agbata, Tochukwu, MD   10 mg at 02/14/23 2132   ondansetron (ZOFRAN) tablet 4 mg  4 mg Oral Q6H PRN Agbata, Tochukwu, MD       Or   ondansetron (ZOFRAN) injection 4 mg  4 mg Intravenous Q6H PRN Agbata, Tochukwu, MD       Oral care mouth rinse  15 mL Mouth Rinse PRN Regalado,  Belkys A, MD       traMADol (ULTRAM) tablet 50 mg  50 mg Oral Q6H PRN Patrecia Pour, MD   50 mg at 02/15/23 0510     Discharge Medications: Please see discharge summary for a list of discharge medications.  Relevant Imaging Results:  Relevant Lab Results:   Additional Information SSN [id number]  Arlie Solomons Rhylin Venters, LCSW

## 2023-02-15 NOTE — Progress Notes (Signed)
Occupational Therapy Treatment Patient Details Name: Eileen Wilkerson MRN: [id number] DOB: 08-07-1932 Today's Date: 02/15/2023   History of present illness 89 yo female admitted with sepsis. Also found to have L L2-L3 disc extrusion. Neurology consulted-conservative management for now since pt has sepsis. Hx of RA, OA, fibromyalgia, osteoporosis, recurrent UTI, asthma, diverticulitis, adrenal failure.   OT comments  Pt progressing towards acute OT goals. Focus of session was initiating trial of right hand orthosis. Pt instructed in donning/doffing and wearing schedule. Could potentially move up to a resting hand splint if the orthosis provided today does not provide enough support. D/c recommendation remains appropriate.    Recommendations for follow up therapy are one component of a multi-disciplinary discharge planning process, led by the attending physician.  Recommendations may be updated based on patient status, additional functional criteria and insurance authorization.    Follow Up Recommendations  Skilled nursing-short term rehab (<3 hours/day)     Assistance Recommended at Discharge Frequent or constant Supervision/Assistance  Patient can return home with the following  A little help with walking and/or transfers;A little help with bathing/dressing/bathroom;Assistance with cooking/housework;Assist for transportation;Direct supervision/assist for financial management;Direct supervision/assist for medications management   Equipment Recommendations  None recommended by OT    Recommendations for Other Services      Precautions / Restrictions Precautions Precautions: Fall Restrictions Weight Bearing Restrictions: No       Mobility Bed Mobility                    Transfers                         Balance                                           ADL either performed or assessed with clinical judgement   ADL                                               Extremity/Trunk Assessment Upper Extremity Assessment Upper Extremity Assessment: Generalized weakness;RUE deficits/detail RUE Deficits / Details: swelling and soreness at MCP joints. 3rd digit MCP joint deformity. able to passively position digits in neutral position but limited MCP extension. RUE Coordination: decreased fine motor;decreased gross motor   Lower Extremity Assessment Lower Extremity Assessment: Defer to PT evaluation        Vision       Perception     Praxis      Cognition Arousal/Alertness: Awake/alert Behavior During Therapy: WFL for tasks assessed/performed Overall Cognitive Status: Within Functional Limits for tasks assessed                                 General Comments: some decreased STM        Exercises      Shoulder Instructions       General Comments Provided orthosis for right hand for comfort. adjusted straps to fit. Educated pt on donnin/doffing and wearing schedule as well as purpose of orthosis. Handout provided as well.    Pertinent Vitals/ Pain       Pain Assessment Pain Assessment: Faces Faces Pain Scale: Hurts little more Pain  Location: R hand Pain Descriptors / Indicators: Aching, Discomfort Pain Intervention(s): Monitored during session, Repositioned, Limited activity within patient's tolerance  Home Living                                          Prior Functioning/Environment              Frequency  Min 2X/week        Progress Toward Goals  OT Goals(current goals can now be found in the care plan section)  Progress towards OT goals: Progressing toward goals  Acute Rehab OT Goals Patient Stated Goal: To improve memory and to protect hands from further RA deformities. OT Goal Formulation: With patient Time For Goal Achievement: 02/28/23 Potential to Achieve Goals: Fair ADL Goals Pt Will Perform Lower Body Bathing: sitting/lateral  leans;sit to/from stand;with modified independence;with adaptive equipment Pt Will Transfer to Toilet: ambulating;with modified independence;grab bars Pt Will Perform Toileting - Clothing Manipulation and hygiene: with modified independence;sitting/lateral leans;sit to/from stand Pt Will Perform Tub/Shower Transfer: Shower transfer;with supervision;grab bars Pt/caregiver will Perform Home Exercise Program: Increased ROM;Increased strength;Both right and left upper extremity;With Supervision Additional ADL Goal #1: Pt will identify at least 3 joint protection techniques for RA with help from Joint protection handout as needed.  Plan Discharge plan remains appropriate    Co-evaluation                 AM-PAC OT "6 Clicks" Daily Activity     Outcome Measure   Help from another person eating meals?: None Help from another person taking care of personal grooming?: A Little Help from another person toileting, which includes using toliet, bedpan, or urinal?: A Little Help from another person bathing (including washing, rinsing, drying)?: A Little Help from another person to put on and taking off regular upper body clothing?: A Little Help from another person to put on and taking off regular lower body clothing?: A Little 6 Click Score: 19    End of Session    OT Visit Diagnosis: Pain;History of falling (Z91.81);Unsteadiness on feet (R26.81);Other symptoms and signs involving cognitive function;Muscle weakness (generalized) (M62.81)   Activity Tolerance     Patient Left in bed;with call bell/phone within reach;with bed alarm set   Nurse Communication          Time: 1115-1130 OT Time Calculation (min): 15 min  Charges: OT General Charges $OT Visit: 1 Visit OT Treatments $Therapeutic Activity: 8-22 mins  Tyrone Schimke, OT Acute Rehabilitation Services Office: [phone]   Hortencia Pilar 02/15/2023, 12:25 PM

## 2023-02-15 NOTE — Progress Notes (Signed)
PROGRESS NOTE    Eileen Wilkerson  [id number] DOB: [birth year] DOA: 02/12/2023 PCP: Mast, [name], NP   Brief Narrative: 89 year old with past medical history significant for rheumatoid arthritis on prednisone, GERD, history of gout, history of adrenal insufficiency who presents to the ED from friend's home after she fell on the morning of admission while trying to wipe herself after using bathroom.  Per daughter report patient had fallen 2 weeks prior to admission, she complaining of left lower abdominal pain, progressive left leg weakness.  In the ED she was noted to be mild febrile, lactic acid 2.6, leukocytosis.  She received IV fluids and is started on antibiotics.   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  UA negative for infection. Urine culture no growth to date.  Blood culture no growth to date.  Chest x ray no acute finding.  CT abdomen and pelvis: No acute or inflammatory process. Discontinue IV antibiotics 3/06. Afebrile. Leukocytosis resolved.    2-Acute Metabolic Encephalopathy: Probably related to Acute illness, tramadol contributing.  CT head without acute findings Delirium precaution.  Tramadol dose reduced.  3-L2-L3 disc herniation, left hip pain, worsening progressive weakness over the last 2 weeks. MRI completed, neurosurgery consulted.  Recommended IV steroids.  Physical therapist evaluation. Patient is not a very good candidate for surgery Transition to Medrol pack.  After she complete Medrol pack, will need to resume prednisone.   4-AKI: Treated with IV fluids. Continue with Lasix.  5-Chronic heart failure preserved ejection  fraction: Continue with lasix.  HTN; Will start low dose Norvasc.   Long Term current use of systemic steroid for rheumatoid arthritis: Decadron ordered by neurosurgery.  She will need taper back to her home dose prednisone.  Rheumatoid arthritis: Previously on methotrexate. Follow-up with Dr. Corena Pilgrim as an outpatient  Hypokalemia:  Replaced     Estimated body mass index is 32.9 kg/m as calculated from the following:   Height as of this encounter: '5\' 2"'$  (1.575 m).   Weight as of this encounter: 81.6 kg.   DVT prophylaxis: Lovenox Code Status: Full code Family Communication: Daughter over phone Disposition Plan:  Status is: Inpatient Remains inpatient appropriate because: management of back pain.     Consultants:  Neurosurgery   Procedures:  none  Antimicrobials:    Subjective: She is alert, conversant. Complaining of right hand pain, deformity. Daughter at bedside. Patient has had this problem since January, she saw Rheumatology and prednisone dose was increased.    Objective: Vitals:   02/14/23 1226 02/14/23 1240 02/14/23 2033 02/15/23 0451  BP:  (!) 156/81 (!) 144/76 (!) 172/85  Pulse: (!) 107 76 69 65  Resp:  '18 20 16  '$ Temp:  98.1 F (36.7 C) 98 F (36.7 C) 97.7 F (36.5 C)  TempSrc:  Oral Oral Oral  SpO2:  96% 98% 98%  Weight:      Height:        Intake/Output Summary (Last 24 hours) at 02/15/2023 1239 Last data filed at 02/15/2023 [id number] Gross per 24 hour  Intake 240 ml  Output 850 ml  Net -610 ml    Filed Weights   02/12/23  1039  Weight: 81.6 kg    Examination:  General exam: NAD Respiratory system: CTA Cardiovascular system: S 1, S 2 RRR Gastrointestinal system: BS present, soft nt Central nervous system: alert Extremities: no edema    Data Reviewed: I have personally reviewed following labs and imaging studies  CBC: Recent Labs  Lab 02/12/23 1120 02/13/23 0435 02/14/23 0420  WBC 15.7* 12.4* 9.1  NEUTROABS 10.8*  --   --    HGB 13.7 11.4* 10.5*  HCT 44.7 38.5 34.8*  MCV 96.5 97.2 95.1  PLT 278 250 [id number]    Basic Metabolic Panel: Recent Labs  Lab 02/12/23 1120 02/13/23 0435 02/14/23 0420  NA 139 140 141  K 4.1 3.4* 3.2*  CL 99 104 110  CO2 '27 25 23  '$ GLUCOSE 125* 104* 174*  BUN '17 14 20  '$ CREATININE 1.09* 0.88 0.85  CALCIUM 9.4 9.2 9.1    GFR: Estimated Creatinine Clearance: 45.3 mL/min (by C-G formula based on SCr of 0.85 mg/dL). Liver Function Tests: Recent Labs  Lab 02/12/23 1120  AST 26  ALT 15  ALKPHOS 61  BILITOT 1.4*  PROT 7.9  ALBUMIN 3.8    No results for input(s): "LIPASE", "AMYLASE" in the last 168 hours. No results for input(s): "AMMONIA" in the last 168 hours. Coagulation Profile: Recent Labs  Lab 02/12/23 1120  INR 1.1    Cardiac Enzymes: No results for input(s): "CKTOTAL", "CKMB", "CKMBINDEX", "TROPONINI" in the last 168 hours. BNP (last 3 results) No results for input(s): "PROBNP" in the last 8760 hours. HbA1C: No results for input(s): "HGBA1C" in the last 72 hours. CBG: No results for input(s): "GLUCAP" in the last 168 hours. Lipid Profile: No results for input(s): "CHOL", "HDL", "LDLCALC", "TRIG", "CHOLHDL", "LDLDIRECT" in the last 72 hours. Thyroid Function Tests: No results for input(s): "TSH", "T4TOTAL", "FREET4", "T3FREE", "THYROIDAB" in the last 72 hours. Anemia Panel: No results for input(s): "VITAMINB12", "FOLATE", "FERRITIN", "TIBC", "IRON", "RETICCTPCT" in the last 72 hours. Sepsis Labs: Recent Labs  Lab 02/12/23 1046 02/12/23 1307 02/12/23 1707 02/12/23 1908  LATICACIDVEN 2.3* 2.6* 1.4 1.3     Recent Results (from the past 240 hour(s))  Blood Culture (routine x 2)     Status: None (Preliminary result)   Collection Time: 02/12/23 11:20 AM   Specimen: BLOOD RIGHT ARM  Result Value Ref Range Status   Specimen Description   Final    BLOOD RIGHT ARM Performed at [facility] [address], [place], [place] [address]     Special Requests   Final    BOTTLES DRAWN AEROBIC AND ANAEROBIC Blood Culture adequate volume Performed at [facility] [address], [place], [place] [address]    Culture   Final    NO GROWTH 3 DAYS Performed at [facility], [place] [address], [place], [place] [address]    Report Status PENDING  Incomplete  Resp panel by RT-PCR (RSV, Flu A&B, Covid) Peripheral     Status: None   Collection Time: 02/12/23 11:21 AM   Specimen: Peripheral; Nasal Swab  Result Value Ref Range Status   SARS Coronavirus 2 by RT PCR NEGATIVE NEGATIVE Final    Comment: (NOTE) SARS-CoV-2 target nucleic acids are NOT DETECTED.  The SARS-CoV-2 RNA is generally detectable in upper respiratory specimens during the acute phase of infection. The lowest concentration of SARS-CoV-2 viral copies this assay can detect is 138 copies/mL. A negative result does not preclude SARS-Cov-2 infection and should not be used as  the sole basis for treatment or other patient management decisions. A negative result may occur with  improper specimen collection/handling, submission of specimen other than nasopharyngeal swab, presence of viral mutation(s) within the areas targeted by this assay, and inadequate number of viral copies(<138 copies/mL). A negative result must be combined with clinical observations, patient history, and epidemiological information. The expected result is Negative.  Fact Sheet for Patients:  EntrepreneurPulse.com.au  Fact Sheet for Healthcare Providers:  IncredibleEmployment.be  This test is no t yet approved or cleared by the [place] FDA and  has been authorized for detection and/or diagnosis of SARS-CoV-2 by FDA under an Emergency Use Authorization (EUA). This EUA will remain  in effect (meaning this test can be used) for the duration of the COVID-19 declaration under Section 564(b)(1) of the Act, 21 U.S.C.section 360bbb-3(b)(1),  unless the authorization is terminated  or revoked sooner.       Influenza A by PCR NEGATIVE NEGATIVE Final   Influenza B by PCR NEGATIVE NEGATIVE Final    Comment: (NOTE) The Xpert Xpress SARS-CoV-2/FLU/RSV plus assay is intended as an aid in the diagnosis of influenza from Nasopharyngeal swab specimens and should not be used as a sole basis for treatment. Nasal washings and aspirates are unacceptable for Xpert Xpress SARS-CoV-2/FLU/RSV testing.  Fact Sheet for Patients: EntrepreneurPulse.com.au  Fact Sheet for Healthcare Providers: IncredibleEmployment.be  This test is not yet approved or cleared by the [place] FDA and has been authorized for detection and/or diagnosis of SARS-CoV-2 by FDA under an Emergency Use Authorization (EUA). This EUA will remain in effect (meaning this test can be used) for the duration of the COVID-19 declaration under Section 564(b)(1) of the Act, 21 U.S.C. section 360bbb-3(b)(1), unless the authorization is terminated or revoked.     Resp Syncytial Virus by PCR NEGATIVE NEGATIVE Final    Comment: (NOTE) Fact Sheet for Patients: EntrepreneurPulse.com.au  Fact Sheet for Healthcare Providers: IncredibleEmployment.be  This test is not yet approved or cleared by the [place] FDA and has been authorized for detection and/or diagnosis of SARS-CoV-2 by FDA under an Emergency Use Authorization (EUA). This EUA will remain in effect (meaning this test can be used) for the duration of the COVID-19 declaration under Section 564(b)(1) of the Act, 21 U.S.C. section 360bbb-3(b)(1), unless the authorization is terminated or revoked.  Performed at [facility], [place] [address], [place], [place] [address]   Blood Culture (routine x 2)     Status: None (Preliminary result)   Collection Time: 02/12/23 11:34 AM   Specimen: BLOOD  Result Value Ref Range Status    Specimen Description   Final    BLOOD BLOOD LEFT ARM Performed at [facility] [address], [place], [place] [address]    Special Requests   Final    BOTTLES DRAWN AEROBIC AND ANAEROBIC Blood Culture results may not be optimal due to an inadequate volume of blood received in culture bottles Performed at [facility] [address], [place], [place] [address]    Culture   Final    NO GROWTH 3 DAYS Performed at [facility], [place] [address] [place], [place] [address]    Report Status PENDING  Incomplete  MRSA Next Gen by PCR, Nasal     Status: None   Collection Time: 02/12/23  5:13 PM   Specimen: Nasal Mucosa; Nasal Swab  Result Value Ref Range Status   MRSA by PCR Next Gen NOT DETECTED NOT DETECTED  Final    Comment: (NOTE) The GeneXpert MRSA Assay (FDA approved for NASAL specimens only), is one component of a comprehensive MRSA colonization surveillance program. It is not intended to diagnose MRSA infection nor to guide or monitor treatment for MRSA infections. Test performance is not FDA approved in patients less than 31 years old. Performed at [facility], [place] [address] [place], [place] [address]          Radiology Studies: DG Hand 2 View Right  Result Date: 02/14/2023 CLINICAL DATA:  Right hand pain after fall 2 days ago. EXAM: RIGHT HAND - 2 VIEW COMPARISON:  May 27, 2020. FINDINGS: There is no evidence of fracture or dislocation. Degenerative changes are seen involving the first interphalangeal joint as well as the second, third and fifth proximal interphalangeal joints. Soft tissues are unremarkable. IMPRESSION: Findings consistent with osteoarthritis involving multiple joints as described above. No acute abnormality seen. Electronically Signed   By: Marijo Conception M.D.   On: 02/14/2023 17:40        Scheduled Meds:  amLODipine  5 mg Oral Daily   atorvastatin  20 mg Oral Daily   cholecalciferol   5,000 Units Oral Daily   colchicine  0.6 mg Oral Daily   cyanocobalamin  1,000 mcg Oral Daily   cycloSPORINE  1 drop Both Eyes BID   enoxaparin (LOVENOX) injection  40 mg Subcutaneous Q24H   furosemide  40 mg Oral Daily   methylPREDNISolone  4 mg Oral PC lunch   methylPREDNISolone  4 mg Oral PC supper   [START ON 02/16/2023] methylPREDNISolone  4 mg Oral 3 x daily with food   [START ON 02/17/2023] methylPREDNISolone  4 mg Oral 4X daily taper   methylPREDNISolone  8 mg Oral AC breakfast   methylPREDNISolone  8 mg Oral Nightly   [START ON 02/16/2023] methylPREDNISolone  8 mg Oral Nightly   montelukast  10 mg Oral QHS   Continuous Infusions:     LOS: 3 days    Time spent: 35 minutes    Saree Krogh A Choice Kleinsasser, MD [facility]   If 7PM-7AM, please contact night-coverage [email or web address]  02/15/2023, 12:39 PM

## 2023-02-15 NOTE — TOC Progression Note (Addendum)
Transition of Care [facility]) - Progression Note    Patient Details  Name: Eileen Wilkerson MRN: [id number] Date of Birth: 1932/01/08  Transition of Care [facility]) CM/SW [place], LCSW Phone Number: 02/15/2023, 11:48 AM  Clinical Narrative:    CSW received spoke to [facility] with [facility] , she reports having an open bed. CSW will start insurance auth. TOC to follow.  [place]  12:19pm Auth pending   Expected Discharge Plan: [place] Barriers to Discharge: Continued Medical Work up  Expected Discharge Plan and [place] arrangements for the past 2 months: [place]                                       Social Determinants of Health (SDOH) Interventions SDOH Screenings   Food Insecurity: No Food Insecurity (02/12/2023)  Housing: Low Risk  (02/12/2023)  Transportation Needs: No Transportation Needs (02/12/2023)  Utilities: Not At Risk (02/12/2023)  Depression (PHQ2-9): Low Risk  (01/03/2023)  Tobacco Use: Medium Risk (02/12/2023)    Readmission Risk Interventions    05/30/2022   10:08 AM  Readmission Risk Prevention Plan  Transportation Screening Complete  PCP or Specialist Appt within 5-7 Days Complete  Home Care Screening Complete  Medication Review (RN CM) Complete

## 2023-02-16 DIAGNOSIS — M6281 Muscle weakness (generalized): Secondary | ICD-10-CM | POA: Diagnosis not present

## 2023-02-16 DIAGNOSIS — M0579 Rheumatoid arthritis with rheumatoid factor of multiple sites without organ or systems involvement: Secondary | ICD-10-CM | POA: Diagnosis not present

## 2023-02-16 DIAGNOSIS — G9341 Metabolic encephalopathy: Secondary | ICD-10-CM | POA: Diagnosis not present

## 2023-02-16 DIAGNOSIS — N1831 Chronic kidney disease, stage 3a: Secondary | ICD-10-CM | POA: Diagnosis not present

## 2023-02-16 DIAGNOSIS — M109 Gout, unspecified: Secondary | ICD-10-CM | POA: Diagnosis not present

## 2023-02-16 DIAGNOSIS — E876 Hypokalemia: Secondary | ICD-10-CM | POA: Diagnosis not present

## 2023-02-16 DIAGNOSIS — J45909 Unspecified asthma, uncomplicated: Secondary | ICD-10-CM | POA: Diagnosis not present

## 2023-02-16 DIAGNOSIS — W19XXXA Unspecified fall, initial encounter: Secondary | ICD-10-CM | POA: Diagnosis not present

## 2023-02-16 DIAGNOSIS — G9340 Encephalopathy, unspecified: Secondary | ICD-10-CM

## 2023-02-16 DIAGNOSIS — M8580 Other specified disorders of bone density and structure, unspecified site: Secondary | ICD-10-CM | POA: Diagnosis not present

## 2023-02-16 DIAGNOSIS — E538 Deficiency of other specified B group vitamins: Secondary | ICD-10-CM | POA: Diagnosis not present

## 2023-02-16 DIAGNOSIS — E099 Drug or chemical induced diabetes mellitus without complications: Secondary | ICD-10-CM | POA: Diagnosis not present

## 2023-02-16 DIAGNOSIS — Z86718 Personal history of other venous thrombosis and embolism: Secondary | ICD-10-CM | POA: Diagnosis not present

## 2023-02-16 DIAGNOSIS — E782 Mixed hyperlipidemia: Secondary | ICD-10-CM | POA: Diagnosis not present

## 2023-02-16 DIAGNOSIS — I872 Venous insufficiency (chronic) (peripheral): Secondary | ICD-10-CM | POA: Diagnosis not present

## 2023-02-16 DIAGNOSIS — N179 Acute kidney failure, unspecified: Secondary | ICD-10-CM | POA: Diagnosis not present

## 2023-02-16 DIAGNOSIS — Z7952 Long term (current) use of systemic steroids: Secondary | ICD-10-CM | POA: Diagnosis not present

## 2023-02-16 DIAGNOSIS — Z7401 Bed confinement status: Secondary | ICD-10-CM | POA: Diagnosis not present

## 2023-02-16 DIAGNOSIS — T380X5D Adverse effect of glucocorticoids and synthetic analogues, subsequent encounter: Secondary | ICD-10-CM | POA: Diagnosis not present

## 2023-02-16 DIAGNOSIS — I10 Essential (primary) hypertension: Secondary | ICD-10-CM | POA: Diagnosis not present

## 2023-02-16 DIAGNOSIS — M5116 Intervertebral disc disorders with radiculopathy, lumbar region: Secondary | ICD-10-CM | POA: Diagnosis not present

## 2023-02-16 DIAGNOSIS — I5032 Chronic diastolic (congestive) heart failure: Secondary | ICD-10-CM | POA: Diagnosis not present

## 2023-02-16 LAB — GLUCOSE, CAPILLARY
Glucose-Capillary: 132 mg/dL — ABNORMAL HIGH (ref 70–99)
Glucose-Capillary: 150 mg/dL — ABNORMAL HIGH (ref 70–99)

## 2023-02-16 LAB — HEMOGLOBIN A1C
Hgb A1c MFr Bld: 6.7 % — ABNORMAL HIGH (ref 4.8–5.6)
Mean Plasma Glucose: 146 mg/dL

## 2023-02-16 MED ORDER — AMLODIPINE BESYLATE 10 MG PO TABS: 10.0000 mg | ORAL_TABLET | Freq: Every day | ORAL | 1 refills | Status: DC

## 2023-02-16 MED ORDER — METHYLPREDNISOLONE 4 MG PO TBPK: ORAL_TABLET | ORAL | 0 refills | Status: DC

## 2023-02-16 MED ORDER — AMLODIPINE BESYLATE 10 MG PO TABS
5.0000 mg | ORAL_TABLET | Freq: Once | ORAL | Status: AC
  Administered 2023-02-16: 5 mg via ORAL
  Filled 2023-02-16: qty 1

## 2023-02-16 MED ORDER — AMLODIPINE BESYLATE 10 MG PO TABS: 10.0000 mg | ORAL_TABLET | Freq: Every day | ORAL | Status: DC

## 2023-02-16 MED ORDER — TRAMADOL HCL 50 MG PO TABS: 50.0000 mg | ORAL_TABLET | Freq: Four times a day (QID) | ORAL | 0 refills | Status: DC | PRN

## 2023-02-16 NOTE — TOC Transition Note (Addendum)
Transition of Care [facility]) - CM/SW Discharge Note   Patient Details  Name: Eileen Wilkerson MRN: [id number] Date of Birth: 03-14-1932  [facility]) CM/SW Contact:  Illene Regulus, LCSW Phone Number: 02/16/2023, 11:01 AM   Clinical Narrative:    Pt's insurance auth approved. Pt to d/c to [facility] home [id number] [phone] call report [phone]. Spoke with pt daughter she is requesting transportation. PTAR called . TOC sign off.    Barriers to Discharge: Continued Medical Work up   Patient Goals and CMS Choice CMS Medicare.gov Compare Post Acute Care list provided to:: Patient Choice offered to / list presented to : Patient  Discharge Placement                         Discharge Plan and Services Additional resources added to the After Visit Summary for                                       Social Determinants of Health (SDOH) Interventions SDOH Screenings   Food Insecurity: No Food Insecurity (02/12/2023)  Housing: Low Risk  (02/12/2023)  Transportation Needs: No Transportation Needs (02/12/2023)  Utilities: Not At Risk (02/12/2023)  Depression (PHQ2-9): Low Risk  (01/03/2023)  Tobacco Use: Medium Risk (02/12/2023)     Readmission Risk Interventions    05/30/2022   10:08 AM  Readmission Risk Prevention Plan  Transportation Screening Complete  PCP or Specialist Appt within 5-7 Days Complete  Home Care Screening Complete  Medication Review (RN CM) Complete

## 2023-02-16 NOTE — Progress Notes (Signed)
Attempted to call report X 2 @ [phone]  [phone] unsuccessful. I left my name and contact number on the voicemail.

## 2023-02-16 NOTE — Progress Notes (Signed)
Daughter, [place],Jacqueline notified of patient's discharge to facility.

## 2023-02-16 NOTE — Discharge Summary (Addendum)
Physician Discharge Summary   Patient: Eileen Wilkerson MRN: [id number] DOB: December 23, 1931  Admit date:     02/12/2023  Discharge date: 02/16/23  Discharge Physician: Elmarie Shiley   PCP: Mast, [name], NP   Recommendations at discharge:    Patient will be discharge on Dexamethasone taper dose, after she complete Dexamethasone please resume prednisone.  Adjust BP medications as needed.  Hb-A1 at 6.7 monitor cbg on steroids. Needs to work on diet.   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 *  Hospital Course: 89 year old with past medical history significant for rheumatoid arthritis on prednisone, GERD, history of gout, history of adrenal insufficiency who presents to the ED from friend's home after she fell on the morning of admission while trying to wipe herself after using bathroom.  Per daughter report patient had fallen 2 weeks prior to admission, she complaining of left lower abdominal pain, progressive left leg weakness.  In the ED she was noted to be mild febrile, lactic acid 2.6, leukocytosis.  She received IV fluids and is started on antibiotics.    Assessment and Plan: 1-SIRS;  Sepsis ruled out.  UA negative for infection. Urine culture no growth to date.  Blood culture no growth to date.  Chest x ray no acute finding.  CT abdomen and pelvis: No acute or inflammatory process. Discontinue IV antibiotics 3/06. Afebrile. Leukocytosis resolved.      2-Acute Metabolic-toxic  Encephalopathy: Probably related to Acute illness, dehydration  tramadol contributing.  CT head without acute findings Delirium precaution.  Tramadol dose reduced.   3-L2-L3 disc  herniation, left hip pain, worsening progressive weakness over the last 2 weeks. MRI completed, neurosurgery consulted.  Recommended IV steroids.  Physical therapist evaluation. Patient is not a very good candidate for surgery Transition to Medrol pack.  After she complete Medrol pack, will need to resume prednisone.    4-AKI: Treated with IV fluids. Continue with Lasix.   5-Chronic heart failure preserved ejection fraction: Continue with lasix.  HTN; Will start low dose Norvasc.    Long Term current use of systemic steroid for rheumatoid arthritis: Decadron ordered by neurosurgery.  She will need taper back to her home dose prednisone.   Rheumatoid arthritis: Previously on methotrexate. Follow-up with Dr. Corena Pilgrim as an outpatient   Hypokalemia:  Replaced    New Diabetes; A1c mildly elevated. Needs to work on diet          Consultants: Neurosurgery  Procedures performed: None Disposition: Home Diet recommendation:  Discharge Diet Orders (From admission, onward)     Start     Ordered   02/16/23 0000  Diet - low sodium heart healthy        02/16/23 1113           Carb modified diet DISCHARGE MEDICATION: Allergies as of 02/16/2023       Reactions   Adhesive [tape] Rash   Celebrex [celecoxib] Hives   Ciprofibrate Nausea Only   Cymbalta [duloxetine Hcl] Swelling   Gabitril [tiagabine] Swelling   Lyrica [pregabalin] Swelling   Neurontin [gabapentin] Swelling   Nexium [esomeprazole] Rash   Nsaids Rash   Shrimp [shellfish Allergy] Anaphylaxis   Per patient "  shrimp only"   Azactam [aztreonam]    Hand swelling    Azelastine Hcl    Rash    Ciprofloxacin Other (See Comments)   dizziness   Claritin [loratadine]    Irritability Nervousness    Methotrexate Derivatives    Nasacort [triamcinolone]    Dizzy    Olopatadine Other (See Comments)   Pain and lethargy    Other    Sulfamethizole Other (See Comments)   unknown   Zantac [ranitidine Hcl] Other (See  Comments)   unknown   Claritin-d 12 Hour [loratadine-pseudoephedrine Er] Anxiety   Keflex [cephalexin] Nausea And Vomiting   Tolerated Ancef   Penicillins Rash   Injection site reaction. Tolerated cefepime in past. Also reports tolerating a penicillin infusion after this initial rxn ~20 yrs ago.  Has patient had a PCN reaction causing immediate rash, facial/tongue/throat swelling, SOB or lightheadedness with hypotension: No Has patient had a PCN reaction causing severe rash involving mucus membranes or skin necrosis: No Has patient had a PCN reaction that required hospitalization No Has patient had a PCN reaction occurring within the last 10 years: No   Sulfa Antibiotics Nausea And Vomiting        Medication List     STOP taking these medications    fluconazole 100 MG tablet Commonly known as: Diflucan   predniSONE 10 MG tablet Commonly known as: DELTASONE   predniSONE 5 MG tablet Commonly known as: DELTASONE       TAKE these medications    amLODipine 10 MG tablet Commonly known as: NORVASC Take 1 tablet (10 mg total) by mouth daily. Start taking on: February 17, 2023   atorvastatin 20 MG tablet Commonly known as: LIPITOR TAKE 1 TABLET BY MOUTH EVERY DAY   Biotin 5 MG Tbdp Take 1 tablet (5 mg total) by mouth daily.   colchicine 0.6 MG tablet TAKE 1 TABLET BY MOUTH EVERY DAY   CRANBERRY PO Take 1 capsule by mouth daily.   cycloSPORINE 0.05 % ophthalmic emulsion Commonly known as: RESTASIS Place 1 drop into both eyes 2 (two) times daily.   diclofenac Sodium 1 % Gel Commonly known as: VOLTAREN APPLY 2 TO 4 GRAMS TOPICALLY TO AFFECTED JOINTS UP TO 4 TIMES DAILY What changed:  how much to take how to take this when to take this reasons to take this   fexofenadine 180 MG tablet Commonly known as: ALLEGRA Take 1 tablet (180 mg total) by mouth daily.   furosemide 80 MG tablet Commonly known as: LASIX Take 1 tablet (80 mg total) by mouth daily.    lansoprazole 30 MG capsule Commonly known as: PREVACID TAKE 1 CAPSULE BY MOUTH ONCE DAILY AT NOON What changed: See the new instructions.   methylPREDNISolone 4 MG Tbpk tablet Commonly known as: MEDROL DOSEPAK Take 4 mg three times a day for 2 days, Then take 4 mg twice a day for one day, then take 4 mg for one day. Then resume home dose Prednisone.   montelukast 10 MG tablet Commonly known as: SINGULAIR TAKE 1 TABLET BY MOUTH EVERYDAY AT BEDTIME What changed: See the new instructions.   potassium chloride 10 MEQ CR capsule Commonly known as: MICRO-K Take 2 capsules (20 mEq total) by mouth 2 (two) times daily.   traMADol 50 MG tablet Commonly known as: ULTRAM Take 1 tablet (50 mg total) by mouth every 6 (six) hours as needed (pain). What changed:  medication strength how much to take reasons to take this Another medication with  the same name was removed. Continue taking this medication, and follow the directions you see here.   Vitamin D3 125 MCG (5000 UT) capsule Generic drug: Cholecalciferol TAKE 1 CAPSULE BY MOUTH EVERY DAY        Discharge Exam: Filed Weights   02/12/23 1039  Weight: 81.6 kg   General NAD  Condition at discharge: stable  The results of significant diagnostics from this hospitalization (including imaging, microbiology, ancillary and laboratory) are listed below for reference.   Imaging Studies: DG Hand 2 View Right  Result Date: 02/14/2023 CLINICAL DATA:  Right hand pain after fall 2 days ago. EXAM: RIGHT HAND - 2 VIEW COMPARISON:  May 27, 2020. FINDINGS: There is no evidence of fracture or dislocation. Degenerative changes are seen involving the first interphalangeal joint as well as the second, third and fifth proximal interphalangeal joints. Soft tissues are unremarkable. IMPRESSION: Findings consistent with osteoarthritis involving multiple joints as described above. No acute abnormality seen. Electronically Signed   By: Marijo Conception M.D.    On: 02/14/2023 17:40   MR LUMBAR SPINE WO CONTRAST  Result Date: 02/13/2023 CLINICAL DATA:  Lumbar radiculopathy EXAM: MRI LUMBAR SPINE WITHOUT CONTRAST TECHNIQUE: Multiplanar, multisequence MR imaging of the lumbar spine was performed. No intravenous contrast was administered. COMPARISON:  09/09/2019 FINDINGS: Evaluation is limited by motion. Segmentation: 5 lumbar type vertebral bodies. Alignment: Trace anterolisthesis of L5 on S1, unchanged. Mild levocurvature of the upper lumbar spine, with compensatory dextrocurvature of the lower lumbar spine. Vertebrae:  No fracture, evidence of discitis, or bone lesion. Conus medullaris and cauda equina: Conus extends to the L1 level. Conus and cauda equina appear normal. Paraspinal and other soft tissues: Renal cysts, for which no follow-up is currently indicated. Atrophy of the paraspinous musculature. Disc levels: T12-L1: Disc height loss and mild disc bulge. Mild facet arthropathy. Mild spinal canal stenosis and moderate right neural foraminal narrowing, unchanged. L1-L2: Disc height loss and mild disc bulge moderate facet arthropathy. Ligamentum flavum hypertrophy. Mild-to-moderate spinal canal stenosis, unchanged. Narrowing of the lateral recesses. No neural foraminal narrowing. L2-L3: Disc height loss and minimal disc bulge with superimposed left subarticular disc extrusion with 13 mm caudal migration (series 5, image 11), which appears new from the prior exam. This contacts the descending left L3 nerve roots. Mild spinal canal stenosis. No significant neural foraminal narrowing. L3-L4: Prior fusion with interbody disc spacer. Mild facet arthropathy. Ligamentum flavum hypertrophy. Narrowing of the lateral recesses. Mild spinal canal stenosis and mild bilateral neural foraminal narrowing, unchanged. L4-L5: Prior fusion with interbody disc spacer. Mild facet arthropathy. Narrowing of the lateral recesses. No spinal canal stenosis. Mild bilateral neural foraminal  narrowing, unchanged. L5-S1: Trace anterolisthesis with disc unroofing and small central disc protrusion. Moderate facet arthropathy. No spinal canal stenosis or neural foraminal narrowing. IMPRESSION: 1. Evaluation is limited by motion. Within this limitation, there is a new left subarticular disc extrusion at L2-L3 with caudal migration, which contacts the descending left L3 nerve roots. 2. L1-L2 mild-to-moderate spinal canal stenosis, unchanged. 3. T12-L1 mild spinal canal stenosis and moderate right neural foraminal narrowing, unchanged. 4. L3-L4 mild spinal canal stenosis and mild bilateral neural foraminal narrowing, unchanged. L4-L5 mild bilateral neural foraminal narrowing, unchanged. 5. Narrowing of the lateral recesses at L1-L2, L3-L4, and L4-L5 could affect the descending L2, L4, and L5 nerve roots, respectively. Electronically Signed   By: Merilyn Baba M.D.   On: 02/13/2023 02:45   CT HEAD WO CONTRAST (5MM)  Result Date: 02/12/2023 CLINICAL DATA:  Neuro deficit, acute, stroke suspected EXAM: CT HEAD WITHOUT CONTRAST TECHNIQUE: Contiguous axial images were obtained from the base of the skull through the vertex without intravenous contrast. RADIATION DOSE REDUCTION: This exam was performed according to the departmental dose-optimization program which includes automated exposure control, adjustment of the mA and/or kV according to patient size and/or use of iterative reconstruction technique. COMPARISON:  None Available. FINDINGS: Brain: No evidence of acute large vascular territory infarction, hemorrhage, hydrocephalus, extra-axial collection or mass lesion/mass effect. Moderate patchy white matter hypodensities, nonspecific but compatible with chronic microvascular ischemic disease. Cerebral atrophy Vascular: No hyperdense vessel. Skull: No acute fracture. Sinuses/Orbits: Mild paranasal sinus mucosal thickening. No acute orbital findings. Other: No mastoid effusions. IMPRESSION: 1. No evidence of  acute intracranial abnormality. 2. Moderate chronic microvascular ischemic disease. Electronically Signed   By: Margaretha Sheffield M.D.   On: 02/12/2023 17:54   CT ABDOMEN PELVIS W CONTRAST  Result Date: 02/12/2023 CLINICAL DATA:  89 year old female status post fall from toilet this morning. Abdominal pain. EXAM: CT ABDOMEN AND PELVIS WITH CONTRAST TECHNIQUE: Multidetector CT imaging of the abdomen and pelvis was performed using the standard protocol following bolus administration of intravenous contrast. RADIATION DOSE REDUCTION: This exam was performed according to the departmental dose-optimization program which includes automated exposure control, adjustment of the mA and/or kV according to patient size and/or use of iterative reconstruction technique. CONTRAST:  144mL OMNIPAQUE IOHEXOL 300 MG/ML  SOLN COMPARISON:  CT Abdomen and Pelvis 05/21/2022. FINDINGS: Lower chest: Lung bases are stable from last year with combined mild atelectasis, scarring, and also chronic postinflammatory small lung nodularity. No pericardial effusion, pleural effusion, or acute lung base opacity. Hepatobiliary: Liver and gallbladder appear stable and negative. Pancreas: Chronic pancreatic atrophy. Spleen: Negative. Adrenals/Urinary Tract: Normal adrenal glands. Kidneys are stable and nonobstructed. Small chronic benign appearing renal cysts (no follow-up imaging recommended). Symmetric renal enhancement and contrast excretion. No hydroureter. Unremarkable bladder. Stomach/Bowel: Chronic sigmoid diverticulosis, maximal in the anterior sigmoid and junction with the descending colon. That segment appears stable from the CT last year, no definite active inflammation. Mild large bowel redundancy and retained stool otherwise. Cecum located in the midline today. Elongated but normal appendix tracking to the right (series 2, image 58). No dilated small bowel. Small gastric hiatal hernia or phrenic ampulla (normal variant). Otherwise  decompressed stomach and duodenum. No free air or free fluid identified. Stable chronic rectus muscle diastasis. Vascular/Lymphatic: Aortoiliac calcified atherosclerosis. Normal abdominal aortic caliber. Major arterial structures remain patent. Portal venous system is patent. No lymphadenopathy identified. Reproductive: Stable simple 3.8 cm left ovarian cyst since at least 2019 (no follow-up imaging recommended). Diminutive or absent uterus. Negative right ovary. Other: No pelvis free fluid. Musculoskeletal: Advanced chronic spine degeneration and previous lumbar interbody fusion. Background osteopenia. 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 No acute osseous abnormality identified. But evidence of a new caudal left lateral recess disc extrusion since last year at L2-L3 (series 2, image 36). IMPRESSION: 1. No acute traumatic injury identified. No acute or inflammatory process identified in the abdomen or pelvis. 2. 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 Query left L3 radiculitis. 3.  Aortic Atherosclerosis (ICD10-I70.0). Electronically Signed   By: Genevie Ann M.D.   On: 02/12/2023 13:04   DG Chest Port 1 View  Result Date: 02/12/2023 CLINICAL DATA:  Questionable sepsis EXAM: PORTABLE CHEST 1 VIEW COMPARISON:  10/30/2022 FINDINGS: Low volume chest. There is no edema, consolidation,  effusion, or pneumothorax. Normal heart size and mediastinal contours. Artifact from EKG leads. IMPRESSION: Stable low volume chest.  No acute finding. Electronically Signed   By: Jorje Guild M.D.   On: 02/12/2023 11:08   DG Foot Complete Left  Result Date: 02/12/2023 CLINICAL DATA:  Fall.  Questionable sepsis. EXAM: LEFT FOOT - COMPLETE 3+ VIEW COMPARISON:  10/30/2022 FINDINGS: There is no evidence of fracture or dislocation. There is no evidence of arthropathy or other focal bone abnormality. Soft tissues are unremarkable. IMPRESSION: Negative.  Electronically Signed   By: Jorje Guild M.D.   On: 02/12/2023 11:07    Microbiology: Results for orders placed or performed during the hospital encounter of 02/12/23  Blood Culture (routine x 2)     Status: None (Preliminary result)   Collection Time: 02/12/23 11:20 AM   Specimen: BLOOD RIGHT ARM  Result Value Ref Range Status   Specimen Description   Final    BLOOD RIGHT ARM Performed at [facility] [address] [place], [place] [address]    Special Requests   Final    BOTTLES DRAWN AEROBIC AND ANAEROBIC Blood Culture adequate volume Performed at [facility] [address] [place], [place] [address]    Culture   Final    NO GROWTH 4 DAYS Performed at [facility], [place] [address] [place], [place] [address]    Report Status PENDING  Incomplete  Resp panel by RT-PCR (RSV, Flu A&B, Covid) Peripheral     Status: None   Collection Time: 02/12/23 11:21 AM   Specimen: Peripheral; Nasal Swab  Result Value Ref Range Status   SARS Coronavirus 2 by RT PCR NEGATIVE NEGATIVE Final    Comment: (NOTE) SARS-CoV-2 target nucleic acids are NOT DETECTED.  The SARS-CoV-2 RNA is generally detectable in upper respiratory specimens during the acute phase of infection. The lowest concentration of SARS-CoV-2 viral copies this assay can detect is 138 copies/mL. A negative result does not preclude SARS-Cov-2 infection and should not be used as the sole basis for treatment or other patient management decisions. A negative result may occur with  improper specimen collection/handling, submission of specimen other than nasopharyngeal swab, presence of viral mutation(s) within the areas targeted by this assay, and inadequate number of viral copies(<138 copies/mL). A negative result must be combined with clinical observations, patient history, and epidemiological information. The expected result is Negative.  Fact Sheet for Patients:   EntrepreneurPulse.com.au  Fact Sheet for Healthcare Providers:  IncredibleEmployment.be  This test is no t yet approved or cleared by the [place] FDA and  has been authorized for detection and/or diagnosis of SARS-CoV-2 by FDA under an Emergency Use Authorization (EUA). This EUA will remain  in effect (meaning this test can be used) for the duration of the COVID-19 declaration under Section 564(b)(1) of the Act, 21 U.S.C.section 360bbb-3(b)(1), unless the authorization is terminated  or revoked sooner.       Influenza A by PCR NEGATIVE NEGATIVE Final   Influenza B by PCR NEGATIVE NEGATIVE Final    Comment: (NOTE) The Xpert Xpress SARS-CoV-2/FLU/RSV plus assay is intended as an aid in the diagnosis of influenza from Nasopharyngeal swab specimens and should not be used as a sole basis for treatment. Nasal washings and aspirates are unacceptable for Xpert Xpress SARS-CoV-2/FLU/RSV testing.  Fact Sheet for Patients: EntrepreneurPulse.com.au  Fact Sheet for Healthcare Providers: IncredibleEmployment.be  This test is not yet approved or cleared by the [place] FDA and has  been authorized for detection and/or diagnosis of SARS-CoV-2 by FDA under an Emergency Use Authorization (EUA). This EUA will remain in effect (meaning this test can be used) for the duration of the COVID-19 declaration under Section 564(b)(1) of the Act, 21 U.S.C. section 360bbb-3(b)(1), unless the authorization is terminated or revoked.     Resp Syncytial Virus by PCR NEGATIVE NEGATIVE Final    Comment: (NOTE) Fact Sheet for Patients: EntrepreneurPulse.com.au  Fact Sheet for Healthcare Providers: IncredibleEmployment.be  This test is not yet approved or cleared by the [place] FDA and has been authorized for detection and/or diagnosis of SARS-CoV-2 by FDA under an Emergency Use  Authorization (EUA). This EUA will remain in effect (meaning this test can be used) for the duration of the COVID-19 declaration under Section 564(b)(1) of the Act, 21 U.S.C. section 360bbb-3(b)(1), unless the authorization is terminated or revoked.  Performed at [facility], [place] [address] [place], [place] [address]   Blood Culture (routine x 2)     Status: None (Preliminary result)   Collection Time: 02/12/23 11:34 AM   Specimen: BLOOD  Result Value Ref Range Status   Specimen Description   Final    BLOOD BLOOD LEFT ARM Performed at [facility] [address] [place], [place] [address]    Special Requests   Final    BOTTLES DRAWN AEROBIC AND ANAEROBIC Blood Culture results may not be optimal due to an inadequate volume of blood received in culture bottles Performed at [facility] [address] [place], [place] [address]    Culture   Final    NO GROWTH 4 DAYS Performed at [facility], [place] [address] [place], [place] [address]    Report Status PENDING  Incomplete  MRSA Next Gen by PCR, Nasal     Status: None   Collection Time: 02/12/23  5:13 PM   Specimen: Nasal Mucosa; Nasal Swab  Result Value Ref Range Status   MRSA by PCR Next Gen NOT DETECTED NOT DETECTED Final    Comment: (NOTE) The GeneXpert MRSA Assay (FDA approved for NASAL specimens only), is one component of a comprehensive MRSA colonization surveillance program. It is not intended to diagnose MRSA infection nor to guide or monitor treatment for MRSA infections. Test performance is not FDA approved in patients less than 78 years old. Performed at [facility] [address] [place],  [address]     Labs: CBC: Recent Labs  Lab 02/12/23 1120 02/13/23 0435 02/14/23 0420  WBC 15.7* 12.4* 9.1  NEUTROABS 10.8*  --   --   HGB 13.7 11.4* 10.5*  HCT 44.7 38.5 34.8*  MCV 96.5 97.2 95.1  PLT 278 250 [id number]   Basic  Metabolic Panel: Recent Labs  Lab 02/12/23 1120 02/13/23 0435 02/14/23 0420  NA 139 140 141  K 4.1 3.4* 3.2*  CL 99 104 110  CO2 27 25 23   GLUCOSE 125* 104* 174*  BUN 17 14 20   CREATININE 1.09* 0.88 0.85  CALCIUM 9.4 9.2 9.1   Liver Function Tests: Recent Labs  Lab 02/12/23 1120  AST 26  ALT 15  ALKPHOS 61  BILITOT 1.4*  PROT 7.9  ALBUMIN 3.8   CBG: Recent Labs  Lab 02/15/23 1651 02/15/23 2045 02/16/23 0748  GLUCAP 199* 197* 132*    Discharge time spent: greater than 30 minutes.  Signed: Elmarie Shiley, MD [facility] 02/16/2023

## 2023-02-16 NOTE — TOC Progression Note (Signed)
Transition of Care [facility]) - Progression Note    Patient Details  Name: Eileen Wilkerson MRN: [id number] Date of Birth: [birth year]  Transition of Care [facility]) CM/SW [place], LCSW Phone Number: 02/16/2023, 9:26 AM  Clinical Narrative:    Pt's insurance Josem Kaufmann is still pending , additional clinicals was requested. TOC to follow.   Expected Discharge Plan: [place] Barriers to Discharge: Continued Medical Work up  Expected Discharge Plan and [place] arrangements for the past 2 months: [place]                                       Social Determinants of Health (SDOH) Interventions SDOH Screenings   Food Insecurity: No Food Insecurity (02/12/2023)  Housing: Low Risk  (02/12/2023)  Transportation Needs: No Transportation Needs (02/12/2023)  Utilities: Not At Risk (02/12/2023)  Depression (PHQ2-9): Low Risk  (01/03/2023)  Tobacco Use: Medium Risk (02/12/2023)    Readmission Risk Interventions    05/30/2022   10:08 AM  Readmission Risk Prevention Plan  Transportation Screening Complete  PCP or Specialist Appt within 5-7 Days Complete  Home Care Screening Complete  Medication Review (RN CM) Complete

## 2023-02-16 NOTE — Plan of Care (Signed)
  Problem: Clinical Measurements: Goal: Ability to maintain clinical measurements within normal limits will improve Outcome: Progressing   Problem: Activity: Goal: Risk for activity intolerance will decrease Outcome: Progressing   Problem: Coping: Goal: Level of anxiety will decrease Outcome: Progressing   Problem: Safety: Goal: Ability to remain free from injury will improve Outcome: Progressing   

## 2023-02-17 LAB — CULTURE, BLOOD (ROUTINE X 2)
Culture: NO GROWTH
Culture: NO GROWTH
Special Requests: ADEQUATE

## 2023-02-19 ENCOUNTER — Encounter: Payer: Self-pay | Admitting: Nurse Practitioner

## 2023-02-19 ENCOUNTER — Non-Acute Institutional Stay (SKILLED_NURSING_FACILITY): Payer: Medicare PPO | Admitting: Nurse Practitioner

## 2023-02-19 DIAGNOSIS — E876 Hypokalemia: Secondary | ICD-10-CM

## 2023-02-19 DIAGNOSIS — N1831 Chronic kidney disease, stage 3a: Secondary | ICD-10-CM

## 2023-02-19 DIAGNOSIS — E538 Deficiency of other specified B group vitamins: Secondary | ICD-10-CM

## 2023-02-19 DIAGNOSIS — M8580 Other specified disorders of bone density and structure, unspecified site: Secondary | ICD-10-CM | POA: Diagnosis not present

## 2023-02-19 DIAGNOSIS — J45909 Unspecified asthma, uncomplicated: Secondary | ICD-10-CM

## 2023-02-19 DIAGNOSIS — M109 Gout, unspecified: Secondary | ICD-10-CM

## 2023-02-19 DIAGNOSIS — I5032 Chronic diastolic (congestive) heart failure: Secondary | ICD-10-CM

## 2023-02-19 DIAGNOSIS — M5116 Intervertebral disc disorders with radiculopathy, lumbar region: Secondary | ICD-10-CM

## 2023-02-19 DIAGNOSIS — E782 Mixed hyperlipidemia: Secondary | ICD-10-CM

## 2023-02-19 DIAGNOSIS — Z86718 Personal history of other venous thrombosis and embolism: Secondary | ICD-10-CM

## 2023-02-19 DIAGNOSIS — E099 Drug or chemical induced diabetes mellitus without complications: Secondary | ICD-10-CM | POA: Diagnosis not present

## 2023-02-19 DIAGNOSIS — T380X5D Adverse effect of glucocorticoids and synthetic analogues, subsequent encounter: Secondary | ICD-10-CM

## 2023-02-19 DIAGNOSIS — M0579 Rheumatoid arthritis with rheumatoid factor of multiple sites without organ or systems involvement: Secondary | ICD-10-CM

## 2023-02-19 NOTE — Assessment & Plan Note (Signed)
Chronic diastolic heart failure, DOE, on and off wheezing, cough, occasional yellow phlegm in am. CXR no evidence of acute infection 10/2022. EF 60-65% 05/22/22, on Furosemide, Amlodipine

## 2023-02-19 NOTE — Assessment & Plan Note (Signed)
Asthma Epinephrine prn, Allegra, Flonase, Singulair  

## 2023-02-19 NOTE — Assessment & Plan Note (Addendum)
Vit B12 deficiency, on Vit B12 po, Vit B12 235 06/20/21, update CBC/diff

## 2023-02-19 NOTE — Assessment & Plan Note (Signed)
Gout, R ankle, stable, takes Colchicine 

## 2023-02-19 NOTE — Assessment & Plan Note (Signed)
RA/fibromyalgia, taking steroids, and Tramadol, TSH 0.865 02/27/22

## 2023-02-19 NOTE — Assessment & Plan Note (Signed)
K 3.2 03/06/23, update CMP/eGFR

## 2023-02-19 NOTE — Assessment & Plan Note (Signed)
Hx of DVT, venous US BLE was negative for DVT while in hospital. Off Eliquis.  

## 2023-02-19 NOTE — Assessment & Plan Note (Signed)
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 Takes Tramadol prn. Diclofenac, MRI completed, Neurosurgery consulted, not a good surgical candidate. Her mentation has returned to her baseline, Leukocytosis resolved, negative UA, blood culture, CT abd/pelvis, and CT head.

## 2023-02-19 NOTE — Assessment & Plan Note (Signed)
OP Rheumatology, off Alendronate, on Vit D, t score -1.8 06/06/21 

## 2023-02-19 NOTE — Assessment & Plan Note (Signed)
takes Atorvastatin  

## 2023-02-19 NOTE — Progress Notes (Signed)
Location:  [facility] Room Number: NO/22/A Place of Service:  SNF (31) Provider:  Rafferty Postlewait X, NP  Patient Care Team: Benjimen Kelley X, [name]  Extended Emergency Contact Information Primary Emergency Contact: Noel Gerold [place] of [place] Phone: [phone] Mobile Phone: [phone] Relation: Daughter  Code Status: Full Code Goals of care: Advanced Directive information    02/19/2023    2:36 PM  Advanced Directives  Does Patient Have a Medical Advance Directive? Yes  Type of Paramedic of [place];Living will  Does patient want to make changes to medical advance directive? No - Patient declined  Copy of [place] in Chart? Yes - validated most recent copy scanned in chart (See row information)     Chief Complaint  Patient presents with   Acute Visit    Medication review     HPI:  Pt is a 89 y.o. female seen today for an acute visit for medication review following hospital stay.     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 Takes Tramadol prn. Diclofenac, MRI completed, Neurosurgery consulted, not a good surgical candidate. Her mentation has returned to her baseline, Leukocytosis resolved, negative UA, blood culture, CT abd/pelvis, and CT head.    Hgb 6.7, prediabetic, on Steroids, monitor CBG             The right middle finger stiffness, difficulty eating.              Chronic diastolic heart failure, DOE, on and off wheezing, cough, occasional yellow phlegm in am. CXR no evidence of acute infection 10/2022. EF 60-65% 05/22/22, on Furosemide, Amlodipine  Hypokalemia, K 3.2 03/06/23             Hx of sinus tachycardia, heart rate is in control.              Hx of aute sigmoid diverticulitis with pneumoperitoneum, f/u Dr. [name], diarrhea on and off, negative C-diff while  in hospital.              Asthma Epinephrine prn, Allegra, Flonase, Singulair             RA/fibromyalgia, taking steroids, and Tramadol, TSH 0.865 02/27/22             GERD off acid reducer. Hgb 10.5 02/14/23             Venous insufficiency/Edema , takes Furosemide, Kcl.             OP Rheumatology, off Alendronate, on Vit D, t score -1.8 06/06/21             Hyperlipidemia, takes Atorvastatin             Vit B12 deficiency, on Vit B12 po, Vit B12 235 06/20/21             CT left adnexa cyst 11/2020, MRI 05/08/21,  05/22/22 renal US shoed No evidence of renal mass or hydronephrosis.             Gout, R ankle, stable, takes Colchicine             Hx of DVT, venous US BLE was negative for DVT while in hospital. Off Eliquis.     Past Medical History:  Diagnosis Date   Adrenal failure ([place])    Arthritis    Asthma    Cancer ([place])  Cataract    Closed nondisplaced fracture of fifth right metatarsal bone 09/18/2017   Diverticulitis    Per patient   E coli bacteremia    Fibromyalgia 2008   GERD (gastroesophageal reflux disease) 12/18/2016   HCAP (healthcare-associated pneumonia) 02/03/2018   Hyperlipidemia 10/13/2021   Osteoporosis    RA (rheumatoid arthritis) (HCC)    Recurrent upper respiratory infection (URI)    Sepsis due to urinary tract infection ([place]) 01/18/2018   Urticaria    Past Surgical History:  Procedure Laterality Date   BACK SURGERY     BILATERAL CARPAL TUNNEL RELEASE  2005   right and left   CATARACT EXTRACTION, BILATERAL  2004   right and left   CERVICAL FUSION  2011,2010,2008   2 disks   HEEL SPUR SURGERY  2004   lower back fusion  2011   Fusion of 3-4 and 4-5 lower back   RADIOFREQUENCY ABLATION  2020   ROTATOR CUFF REPAIR  [id number]   SQUAMOUS CELL CARCINOMA EXCISION     TONSILLECTOMY AND ADENOIDECTOMY  1947   TOTAL SHOULDER ARTHROPLASTY      Allergies  Allergen Reactions   Adhesive [Tape] Rash   Celebrex [Celecoxib] Hives   Ciprofibrate Nausea Only    Cymbalta [Duloxetine Hcl] Swelling   Gabitril [Tiagabine] Swelling   Lyrica [Pregabalin] Swelling   Neurontin [Gabapentin] Swelling   Nexium [Esomeprazole] Rash   Nsaids Rash   Shrimp [Shellfish Allergy] Anaphylaxis    Per patient "shrimp only"   Azactam [Aztreonam]     Hand swelling    Azelastine Hcl     Rash    Ciprofloxacin Other (See Comments)    dizziness   Claritin [Loratadine]     Irritability Nervousness    Methotrexate Derivatives    Nasacort [Triamcinolone]     Dizzy    Olopatadine Other (See Comments)    Pain and lethargy    Other    Sulfamethizole Other (See Comments)    unknown   Zantac [Ranitidine Hcl] Other (See Comments)    unknown   Claritin-D 12 Hour [Loratadine-Pseudoephedrine Er] Anxiety   Keflex [Cephalexin] Nausea And Vomiting    Tolerated Ancef   Penicillins Rash    Injection site reaction. Tolerated cefepime in past. Also reports tolerating a penicillin infusion after this initial rxn ~20 yrs ago.  Has patient had a PCN reaction causing immediate rash, facial/tongue/throat swelling, SOB or lightheadedness with hypotension: No Has patient had a PCN reaction causing severe rash involving mucus membranes or skin necrosis: No Has patient had a PCN reaction that required hospitalization No Has patient had a PCN reaction occurring within the last 10 years: No     Sulfa Antibiotics Nausea And Vomiting    Outpatient Encounter Medications as of 02/19/2023  Medication Sig   amLODipine (NORVASC) 10 MG tablet Take 1 tablet (10 mg total) by mouth daily.   atorvastatin (LIPITOR) 20 MG tablet TAKE 1 TABLET BY MOUTH EVERY DAY   Biotin 5 MG TBDP Take 1 tablet (5 mg total) by mouth daily.   Cholecalciferol (VITAMIN D3) 125 MCG (5000 UT) CAPS TAKE 1 CAPSULE BY MOUTH EVERY DAY   colchicine 0.6 MG tablet TAKE 1 TABLET BY MOUTH EVERY DAY   CRANBERRY PO Take 1 capsule by mouth daily.   cycloSPORINE (RESTASIS) 0.05 % ophthalmic emulsion Place 1 drop into both eyes 2  (two) times daily.   diclofenac Sodium (VOLTAREN) 1 % GEL APPLY 2 TO 4 GRAMS TOPICALLY TO AFFECTED JOINTS UP TO 4  TIMES DAILY   fexofenadine (ALLEGRA) 180 MG tablet Take 1 tablet (180 mg total) by mouth daily.   furosemide (LASIX) 80 MG tablet Take 1 tablet (80 mg total) by mouth daily.   lansoprazole (PREVACID) 30 MG capsule TAKE 1 CAPSULE BY MOUTH ONCE DAILY AT NOON   methylPREDNISolone (MEDROL DOSEPAK) 4 MG TBPK tablet Take 4 mg three times a day for 2 days, Then take 4 mg twice a day for one day, then take 4 mg for one day. Then resume home dose Prednisone.   montelukast (SINGULAIR) 10 MG tablet TAKE 1 TABLET BY MOUTH EVERYDAY AT BEDTIME   potassium chloride (MICRO-K) 10 MEQ CR capsule Take 2 capsules (20 mEq total) by mouth 2 (two) times daily.   traMADol (ULTRAM) 50 MG tablet Take 1 tablet (50 mg total) by mouth every 6 (six) hours as needed (pain).   No facility-administered encounter medications on file as of 02/19/2023.    Review of Systems  Constitutional:  Negative for appetite change, fatigue and fever.  HENT:  Negative for congestion, postnasal drip, sinus pressure and sore throat.   Eyes:  Negative for visual disturbance.  Respiratory:  Positive for shortness of breath. Negative for cough, chest tightness and wheezing.        DOE, occasional yellow phlegm in am, hacking cough.   Cardiovascular:  Negative for leg swelling.  Gastrointestinal:  Negative for abdominal pain and constipation.       On and off diarrhea.   Genitourinary:  Negative for dysuria and urgency.       Incontinent of urine.   Musculoskeletal:  Positive for arthralgias, back pain, gait problem and myalgias.       Right lower back hip pain, travels down to the right leg. Left groin/hip region pain with movement, able to walk if Tramadol use.   Skin:  Positive for wound. Negative for color change.  Neurological:  Negative for speech difficulty, weakness and light-headedness.  Psychiatric/Behavioral:  Negative  for confusion and sleep disturbance. The patient is not nervous/anxious.     Immunization History  Administered Date(s) Administered   Fluad Quad(high Dose 65+) 09/30/2020   Influenza, High Dose Seasonal PF 08/16/2018, 09/08/2019, 09/24/2020, 10/04/2021   Influenza,inj,Quad PF,6+ Mos 08/11/2016   Influenza-Unspecified 11/12/2017, 10/05/2022   Moderna Covid-19 Vaccine Bivalent Booster 65yr & up 10/04/2021   Moderna Sars-Covid-2 Vaccination 12/15/2019, 01/12/2020, 10/19/2020   PNEUMOCOCCAL CONJUGATE-20 10/04/2021   PPD Test 03/01/2018   Pneumococcal Conjugate-13 06/26/2017   Pneumococcal Polysaccharide-23 12/11/2009   Zoster Recombinat (Shingrix) 01/28/2019, 09/02/2019   Pertinent  Health Maintenance Due  Topic Date Due   INFLUENZA VACCINE  Completed   DEXA SCAN  Completed      11/01/2022    7:45 AM 01/03/2023    3:53 PM 01/25/2023    9:46 AM 01/31/2023    1:08 PM 02/19/2023    2:29 PM  FDenali Parkin the past year?  0 0 0 1  Was there an injury with Fall?  0 0 0 1  Fall Risk Category Calculator  0 0 0 3  (RETIRED) Patient Fall Risk Level High fall risk      Patient at Risk for Falls Due to  Impaired mobility;Impaired balance/gait History of fall(s);Impaired balance/gait;Impaired mobility Impaired balance/gait;Impaired mobility History of fall(s);Impaired balance/gait  Fall risk Follow up  Falls evaluation completed Falls evaluation completed Falls evaluation completed Falls evaluation completed   Functional Status Survey:    Vitals:   02/19/23 1418  BP: (Marland Kitchen  140/80  Pulse: 95  Resp: 18  Temp: (!) 97 F (36.1 C)  SpO2: 94%  Weight: 179 lb 4 oz (81.3 kg)  Height: '5\' 2"'$  (1.575 m)   Body mass index is 32.79 kg/m. Physical Exam Vitals and nursing note reviewed.  Constitutional:      Appearance: Normal appearance.  HENT:     Head: Normocephalic and atraumatic.     Ears:     Comments: A small abrasion from hearing aid at the opening of the right external ear  canal, not infected.     Nose: Nose normal. No rhinorrhea.     Mouth/Throat:     Mouth: Mucous membranes are moist.     Pharynx: No oropharyngeal exudate or posterior oropharyngeal erythema.  Eyes:     Extraocular Movements: Extraocular movements intact.     Conjunctiva/sclera: Conjunctivae normal.     Pupils: Pupils are equal, round, and reactive to light.  Cardiovascular:     Rate and Rhythm: Normal rate and regular rhythm.     Heart sounds: No murmur heard. Pulmonary:     Effort: Pulmonary effort is normal.     Breath sounds: No rales.  Abdominal:     General: Bowel sounds are normal.     Palpations: Abdomen is soft.     Tenderness: There is no abdominal tenderness.  Musculoskeletal:        General: Tenderness present.     Cervical back: Normal range of motion and neck supple.     Right lower leg: No edema.     Left lower leg: No edema.     Comments: Left groin/hip region pain with left leg movement  Skin:    General: Skin is warm and dry.     Comments: Lots of moles. Skin abrasion R+L anterior knee, no s/s of infection  Neurological:     General: No focal deficit present.     Mental Status: She is alert and oriented to person, place, and time. Mental status is at baseline.     Gait: Gait abnormal.  Psychiatric:        Mood and Affect: Mood normal.        Behavior: Behavior normal.        Thought Content: Thought content normal.        Judgment: Judgment normal.     Labs reviewed: Recent Labs    05/28/22 0551 05/29/22 0419 05/30/22 0514 10/27/22 0945 02/12/23 1120 02/13/23 0435 02/14/23 0420  NA 140 140 143   < > 139 140 141  K 3.3* 4.1 3.9   < > 4.1 3.4* 3.2*  CL 99 102 104   < > 99 104 110  CO2 '30 29 30   '$ < > '27 25 23  '$ GLUCOSE 114* 178* 157*   < > 125* 104* 174*  BUN 9 20 24*   < > '17 14 20  '$ CREATININE 0.83 0.85 0.87   < > 1.09* 0.88 0.85  CALCIUM 9.5 9.5 9.1   < > 9.4 9.2 9.1  MG 2.0 2.3 2.2  --   --   --   --    < > = values in this interval not  displayed.   Recent Labs    10/27/22 0945 10/28/22 0357 02/12/23 1120  AST '25 19 26  '$ ALT '17 14 15  '$ ALKPHOS 61 47 61  BILITOT 1.4* 1.1 1.4*  PROT 7.6 6.3* 7.9  ALBUMIN 3.7 2.7* 3.8   Recent Labs    10/28/22  [id number] 10/29/22 0343 10/30/22 1216 02/12/23 1120 02/13/23 0435 02/14/23 0420  WBC 17.1* 13.6*   < > 15.7* 12.4* 9.1  NEUTROABS 14.6* 10.1*  --  10.8*  --   --   HGB 10.9* 9.8*   < > 13.7 11.4* 10.5*  HCT 35.8* 32.8*   < > 44.7 38.5 34.8*  MCV 98.6 99.7   < > 96.5 97.2 95.1  PLT 240 221   < > 278 250 251   < > = values in this interval not displayed.   Lab Results  Component Value Date   TSH 0.865 02/27/2022   Lab Results  Component Value Date   HGBA1C 6.7 (H) 02/15/2023   Lab Results  Component Value Date   CHOL 176 04/06/2020   HDL 67 04/06/2020   LDLCALC 82 04/06/2020   TRIG 171 (H) 04/06/2020   CHOLHDL 2.6 04/06/2020    Significant Diagnostic Results in last 30 days:  DG Hand 2 View Right  Result Date: 02/14/2023 CLINICAL DATA:  Right hand pain after fall 2 days ago. EXAM: RIGHT HAND - 2 VIEW COMPARISON:  May 27, 2020. FINDINGS: There is no evidence of fracture or dislocation. Degenerative changes are seen involving the first interphalangeal joint as well as the second, third and fifth proximal interphalangeal joints. Soft tissues are unremarkable. IMPRESSION: Findings consistent with osteoarthritis involving multiple joints as described above. No acute abnormality seen. Electronically Signed   By: Marijo Conception M.D.   On: 02/14/2023 17:40   MR LUMBAR SPINE WO CONTRAST  Result Date: 02/13/2023 CLINICAL DATA:  Lumbar radiculopathy EXAM: MRI LUMBAR SPINE WITHOUT CONTRAST TECHNIQUE: Multiplanar, multisequence MR imaging of the lumbar spine was performed. No intravenous contrast was administered. COMPARISON:  09/09/2019 FINDINGS: Evaluation is limited by motion. Segmentation: 5 lumbar type vertebral bodies. Alignment: Trace anterolisthesis of L5 on S1, unchanged.  Mild levocurvature of the upper lumbar spine, with compensatory dextrocurvature of the lower lumbar spine. Vertebrae:  No fracture, evidence of discitis, or bone lesion. Conus medullaris and cauda equina: Conus extends to the L1 level. Conus and cauda equina appear normal. Paraspinal and other soft tissues: Renal cysts, for which no follow-up is currently indicated. Atrophy of the paraspinous musculature. Disc levels: T12-L1: Disc height loss and mild disc bulge. Mild facet arthropathy. Mild spinal canal stenosis and moderate right neural foraminal narrowing, unchanged. L1-L2: Disc height loss and mild disc bulge moderate facet arthropathy. Ligamentum flavum hypertrophy. Mild-to-moderate spinal canal stenosis, unchanged. Narrowing of the lateral recesses. No neural foraminal narrowing. L2-L3: Disc height loss and minimal disc bulge with superimposed left subarticular disc extrusion with 13 mm caudal migration (series 5, image 11), which appears new from the prior exam. This contacts the descending left L3 nerve roots. Mild spinal canal stenosis. No significant neural foraminal narrowing. L3-L4: Prior fusion with interbody disc spacer. Mild facet arthropathy. Ligamentum flavum hypertrophy. Narrowing of the lateral recesses. Mild spinal canal stenosis and mild bilateral neural foraminal narrowing, unchanged. L4-L5: Prior fusion with interbody disc spacer. Mild facet arthropathy. Narrowing of the lateral recesses. No spinal canal stenosis. Mild bilateral neural foraminal narrowing, unchanged. L5-S1: Trace anterolisthesis with disc unroofing and small central disc protrusion. Moderate facet arthropathy. No spinal canal stenosis or neural foraminal narrowing. IMPRESSION: 1. Evaluation is limited by motion. Within this limitation, there is a new left subarticular disc extrusion at L2-L3 with caudal migration, which contacts the descending left L3 nerve roots. 2. L1-L2 mild-to-moderate spinal canal stenosis, unchanged. 3.  T12-L1 mild spinal canal stenosis  and moderate right neural foraminal narrowing, unchanged. 4. L3-L4 mild spinal canal stenosis and mild bilateral neural foraminal narrowing, unchanged. L4-L5 mild bilateral neural foraminal narrowing, unchanged. 5. Narrowing of the lateral recesses at L1-L2, L3-L4, and L4-L5 could affect the descending L2, L4, and L5 nerve roots, respectively. Electronically Signed   By: Merilyn Baba M.D.   On: 02/13/2023 02:45   CT HEAD WO CONTRAST (5MM)  Result Date: 02/12/2023 CLINICAL DATA:  Neuro deficit, acute, stroke suspected EXAM: CT HEAD WITHOUT CONTRAST TECHNIQUE: Contiguous axial images were obtained from the base of the skull through the vertex without intravenous contrast. RADIATION DOSE REDUCTION: This exam was performed according to the departmental dose-optimization program which includes automated exposure control, adjustment of the mA and/or kV according to patient size and/or use of iterative reconstruction technique. COMPARISON:  None Available. FINDINGS: Brain: No evidence of acute large vascular territory infarction, hemorrhage, hydrocephalus, extra-axial collection or mass lesion/mass effect. Moderate patchy white matter hypodensities, nonspecific but compatible with chronic microvascular ischemic disease. Cerebral atrophy Vascular: No hyperdense vessel. Skull: No acute fracture. Sinuses/Orbits: Mild paranasal sinus mucosal thickening. No acute orbital findings. Other: No mastoid effusions. IMPRESSION: 1. No evidence of acute intracranial abnormality. 2. Moderate chronic microvascular ischemic disease. Electronically Signed   By: Margaretha Sheffield M.D.   On: 02/12/2023 17:54   CT ABDOMEN PELVIS W CONTRAST  Result Date: 02/12/2023 CLINICAL DATA:  89 year old female status post fall from toilet this morning. Abdominal pain. EXAM: CT ABDOMEN AND PELVIS WITH CONTRAST TECHNIQUE: Multidetector CT imaging of the abdomen and pelvis was performed using the standard protocol  following bolus administration of intravenous contrast. RADIATION DOSE REDUCTION: This exam was performed according to the departmental dose-optimization program which includes automated exposure control, adjustment of the mA and/or kV according to patient size and/or use of iterative reconstruction technique. CONTRAST:  160m OMNIPAQUE IOHEXOL 300 MG/ML  SOLN COMPARISON:  CT Abdomen and Pelvis 05/21/2022. FINDINGS: Lower chest: Lung bases are stable from last year with combined mild atelectasis, scarring, and also chronic postinflammatory small lung nodularity. No pericardial effusion, pleural effusion, or acute lung base opacity. Hepatobiliary: Liver and gallbladder appear stable and negative. Pancreas: Chronic pancreatic atrophy. Spleen: Negative. Adrenals/Urinary Tract: Normal adrenal glands. Kidneys are stable and nonobstructed. Small chronic benign appearing renal cysts (no follow-up imaging recommended). Symmetric renal enhancement and contrast excretion. No hydroureter. Unremarkable bladder. Stomach/Bowel: Chronic sigmoid diverticulosis, maximal in the anterior sigmoid and junction with the descending colon. That segment appears stable from the CT last year, no definite active inflammation. Mild large bowel redundancy and retained stool otherwise. Cecum located in the midline today. Elongated but normal appendix tracking to the right (series 2, image 58). No dilated small bowel. Small gastric hiatal hernia or phrenic ampulla (normal variant). Otherwise decompressed stomach and duodenum. No free air or free fluid identified. Stable chronic rectus muscle diastasis. Vascular/Lymphatic: Aortoiliac calcified atherosclerosis. Normal abdominal aortic caliber. Major arterial structures remain patent. Portal venous system is patent. No lymphadenopathy identified. Reproductive: Stable simple 3.8 cm left ovarian cyst since at least 2019 (no follow-up imaging recommended). Diminutive or absent uterus. Negative right  ovary. Other: No pelvis free fluid. Musculoskeletal: Advanced chronic spine degeneration and previous lumbar interbody fusion. Background osteopenia. 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 No acute osseous abnormality identified. But evidence of a new caudal left lateral recess disc extrusion since last year at L2-L3 (series 2, image 36). IMPRESSION: 1. No acute traumatic injury identified. No acute or inflammatory process identified in  the abdomen or pelvis. 2. 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 Query left L3 radiculitis. 3.  Aortic Atherosclerosis (ICD10-I70.0). Electronically Signed   By: Genevie Ann M.D.   On: 02/12/2023 13:04   DG Chest Port 1 View  Result Date: 02/12/2023 CLINICAL DATA:  Questionable sepsis EXAM: PORTABLE CHEST 1 VIEW COMPARISON:  10/30/2022 FINDINGS: Low volume chest. There is no edema, consolidation, effusion, or pneumothorax. Normal heart size and mediastinal contours. Artifact from EKG leads. IMPRESSION: Stable low volume chest.  No acute finding. Electronically Signed   By: Jorje Guild M.D.   On: 02/12/2023 11:08   DG Foot Complete Left  Result Date: 02/12/2023 CLINICAL DATA:  Fall.  Questionable sepsis. EXAM: LEFT FOOT - COMPLETE 3+ VIEW COMPARISON:  10/30/2022 FINDINGS: There is no evidence of fracture or dislocation. There is no evidence of arthropathy or other focal bone abnormality. Soft tissues are unremarkable. IMPRESSION: Negative. Electronically Signed   By: Jorje Guild M.D.   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 CXR no evidence of acute infection 10/2022. EF 60-65% 05/22/22, on Furosemide, Amlodipine  Hypokalemia K 3.2 03/06/23, update  CMP/eGFR  Asthma Asthma Epinephrine prn, Allegra, Flonase, Singulair  Rheumatoid arthritis (HCC)  RA/fibromyalgia, taking steroids, and Tramadol, TSH 0.865 02/27/22  Osteopenia OP Rheumatology, off Alendronate, on Vit D, t score -1.8 06/06/21  Hyperlipidemia takes Atorvastatin  B12 deficiency  Vit B12 deficiency, on Vit B12 po, Vit B12 235 06/20/21, update CBC/diff  Gout     Gout, R ankle, stable, takes Colchicine  History of DVT (deep vein thrombosis) Hx of DVT, venous US BLE was negative for DVT while in hospital. Off Eliquis.   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 Takes Tramadol prn. Diclofenac, MRI completed, Neurosurgery consulted, not a good surgical candidate. Her mentation has returned to her baseline, Leukocytosis resolved, negative UA, blood culture, CT abd/pelvis, and CT head.   CKD (chronic kidney disease) stage 3, GFR 30-59 ml/min (HCC) 02/20/23 Na 142, K 4.0, Bun 32, creat 1.02, wbc 12.0, Hgb 12.5, plt 345, neutrophils 74.1     Family/ staff Communication: plan of care reviewed with the patient and charge nurse.   Labs/tests ordered:  CBC/diff, CMP/eGFR   Time spend 35 minutes.

## 2023-02-19 NOTE — Assessment & Plan Note (Signed)
Hgb 6.7, prediabetic, on Steroids, monitor CBG

## 2023-02-20 ENCOUNTER — Non-Acute Institutional Stay (SKILLED_NURSING_FACILITY): Payer: Medicare PPO | Admitting: Family Medicine

## 2023-02-20 DIAGNOSIS — M5116 Intervertebral disc disorders with radiculopathy, lumbar region: Secondary | ICD-10-CM

## 2023-02-20 DIAGNOSIS — M0579 Rheumatoid arthritis with rheumatoid factor of multiple sites without organ or systems involvement: Secondary | ICD-10-CM

## 2023-02-20 DIAGNOSIS — I5032 Chronic diastolic (congestive) heart failure: Secondary | ICD-10-CM

## 2023-02-20 DIAGNOSIS — N179 Acute kidney failure, unspecified: Secondary | ICD-10-CM

## 2023-02-20 DIAGNOSIS — I872 Venous insufficiency (chronic) (peripheral): Secondary | ICD-10-CM | POA: Diagnosis not present

## 2023-02-20 DIAGNOSIS — Z7952 Long term (current) use of systemic steroids: Secondary | ICD-10-CM | POA: Diagnosis not present

## 2023-02-20 DIAGNOSIS — G9341 Metabolic encephalopathy: Secondary | ICD-10-CM | POA: Diagnosis not present

## 2023-02-20 LAB — CBC AND DIFFERENTIAL
HCT: 38 (ref 36–46)
Hemoglobin: 12.5 (ref 12.0–16.0)
Platelets: 345 10*3/uL (ref 150–400)
WBC: 12

## 2023-02-20 LAB — COMPREHENSIVE METABOLIC PANEL
Albumin: 3.5 (ref 3.5–5.0)
Calcium: 9.1 (ref 8.7–10.7)
Globulin: 2.7

## 2023-02-20 LAB — HEPATIC FUNCTION PANEL
ALT: 16 U/L (ref 7–35)
AST: 12 — AB (ref 13–35)
Alkaline Phosphatase: 53 (ref 25–125)

## 2023-02-20 LAB — BASIC METABOLIC PANEL
BUN: 32 — AB (ref 4–21)
CO2: 26 — AB (ref 13–22)
Chloride: 106 (ref 99–108)
Creatinine: 1 (ref 0.5–1.1)
Glucose: 95
Potassium: 4 mEq/L (ref 3.5–5.1)
Sodium: 142 (ref 137–147)

## 2023-02-20 LAB — CBC: RBC: 4.36 (ref 3.87–5.11)

## 2023-02-20 NOTE — Progress Notes (Signed)
Provider:  Alain Honey, MD Location:      Place of Service:     PCP: Mast, [name], NP [name], [name], [name]  Extended Emergency Contact Information Primary Emergency Contact: [name] of [place] Phone: [phone] Mobile Phone: [phone] Relation: Daughter  Code Status:  Goals of Care: Advanced Directive information    02/19/2023    2:36 PM  Advanced Directives  Does Patient Have a Medical Advance Directive? Yes  Type of Paramedic of [place];Living will  Does patient want to make changes to medical advance directive? No - Patient declined  Copy of [place] in Chart? Yes - validated most recent copy scanned in chart (See row information)      No chief complaint on file.   HPI: Patient is a 89 y.o. female seen today for admission to [facility] after a 4-day stay at the hospital evaluate for sepsis.  She had fallen, broken bones were found.  There was an issue with metabolic encephalopathy that was attributed to tramadol and the dose was reduced.  She was seen in consultation by neurosurgeon who recommended IV steroids and PT evaluation.  She is on steroids chronically for arthritis that had been in remission.  Symptoms resolved with fluids and she is discharged here for strengthening and rehab before she is able to return to her independent living apartment.  Past Medical History:  Diagnosis Date   Adrenal failure ([place])    Arthritis    Asthma    Cancer ([place])    Cataract    Closed nondisplaced fracture of fifth right metatarsal bone 09/18/2017   Diverticulitis    Per patient   E coli bacteremia    Fibromyalgia 2008   GERD (gastroesophageal reflux disease) 12/18/2016   HCAP (healthcare-associated pneumonia) 02/03/2018   Hyperlipidemia 10/13/2021   Osteoporosis    RA (rheumatoid arthritis) ([place])    Recurrent upper respiratory  infection (URI)    Sepsis due to urinary tract infection ([place]) 01/18/2018   Urticaria    Past Surgical History:  Procedure Laterality Date   BACK SURGERY     BILATERAL CARPAL TUNNEL RELEASE  2005   right and left   CATARACT EXTRACTION, BILATERAL  2004   right and left   CERVICAL FUSION  2011,2010,2008   2 disks   HEEL SPUR SURGERY  2004   lower back fusion  2011   Fusion of 3-4 and 4-5 lower back   RADIOFREQUENCY ABLATION  2020   ROTATOR CUFF REPAIR  [id number]   SQUAMOUS CELL CARCINOMA EXCISION     TONSILLECTOMY AND ADENOIDECTOMY  1947   TOTAL SHOULDER ARTHROPLASTY      reports that she quit smoking about 56 years ago. Her smoking use included cigarettes. She has a 7.50 pack-year smoking history. She has never been exposed to tobacco smoke. She has never used smokeless tobacco. She reports that she does not drink alcohol and does not use drugs. Social History   Socioeconomic History   Marital status: Widowed    Spouse name: Not on file   Number of children: 2   Years of education: Not on file   Highest education level: Not on file  Occupational History   Occupation: Retired Education officer, museum  Tobacco Use   Smoking status: Former    Packs/day: 0.75    Years: 10.00    Total pack years: 7.50    Types:  Cigarettes    Quit date: 42    Years since quitting: 56.2    Passive exposure: Never   Smokeless tobacco: Never  Vaping Use   Vaping Use: Never used  Substance and Sexual Activity   Alcohol use: No   Drug use: No   Sexual activity: Not Currently  Other Topics Concern   Not on file  Social History Narrative      Diet:        Do you drink/ eat things with caffeine? Dr. Malachi Bonds 2/ day      Marital status: Widowed                              What year were you married ? 1953      Do you live in a house, apartment,assistred living, condo, trailer, etc.)? Apartment      Is it one or more stories?       How many persons live in your home ? 1      Do you have any  pets in your home ?(please list) No      Highest Level of education completed: PhD       Current or past profession: Transport planner, Education officer, museum, Special Educator       Do you exercise?   No                           Type & how often       ADVANCED DIRECTIVES (Please bring copies)      Do you have a living will? Yes      Do you have a DNR form?                       If not, do you want to discuss one? Yes      Do you have signed POA?HPOA forms?                 If so, please bring to your appointment Tes      FUNCTIONAL STATUS- To be completed by Spouse / child / Staff       Do you have difficulty bathing or dressing yourself ? No      Do you have difficulty preparing food or eating ? No      Do you have difficulty managing your mediation ? No      Do you have difficulty managing your finances ? No      Do you have difficulty affording your medication ? No      Social Determinants of Health   Financial Resource Strain: Not on file  Food Insecurity: No Food Insecurity (02/12/2023)   Hunger Vital Sign    Worried About Running Out of Food in the Last Year: Never true    Ran Out of Food in the Last Year: Never true  Transportation Needs: No Transportation Needs (02/12/2023)   PRAPARE - Hydrologist (Medical): No    Lack of Transportation (Non-Medical): No  Physical Activity: Not on file  Stress: Not on file  Social Connections: Not on file  Intimate Partner Violence: Not At Risk (02/12/2023)   Humiliation, Afraid, Rape, and Kick questionnaire    Fear of Current or Ex-Partner: No    Emotionally Abused: No    Physically Abused: No    Sexually Abused: No  Functional Status Survey:    Family History  Problem Relation Age of Onset   Heart attack Maternal Grandmother    Heart attack Paternal Grandfather    Breast cancer Mother 48   Diabetes Father    Heart disease Father    Congestive Heart Failure Father 19       Died from   Allergic rhinitis  Neg Hx    Asthma Neg Hx    Eczema Neg Hx    Urticaria Neg Hx     Health Maintenance  Topic Date Due   DTaP/Tdap/Td (1 - Tdap) Never done   Medicare Annual Wellness (AWV)  01/19/2023   COVID-19 Vaccine (5 - 2023-24 season) 03/11/2024 (Originally 08/11/2022)   Pneumonia Vaccine 64+ Years old  Completed   INFLUENZA VACCINE  Completed   DEXA SCAN  Completed   Zoster Vaccines- Shingrix  Completed   HPV VACCINES  Aged Out    Allergies  Allergen Reactions   Adhesive [Tape] Rash   Celebrex [Celecoxib] Hives   Ciprofibrate Nausea Only   Cymbalta [Duloxetine Hcl] Swelling   Gabitril [Tiagabine] Swelling   Lyrica [Pregabalin] Swelling   Neurontin [Gabapentin] Swelling   Nexium [Esomeprazole] Rash   Nsaids Rash   Shrimp [Shellfish Allergy] Anaphylaxis    Per patient "shrimp only"   Azactam [Aztreonam]     Hand swelling    Azelastine Hcl     Rash    Ciprofloxacin Other (See Comments)    dizziness   Claritin [Loratadine]     Irritability Nervousness    Methotrexate Derivatives    Nasacort [Triamcinolone]     Dizzy    Olopatadine Other (See Comments)    Pain and lethargy    Other    Sulfamethizole Other (See Comments)    unknown   Zantac [Ranitidine Hcl] Other (See Comments)    unknown   Claritin-D 12 Hour [Loratadine-Pseudoephedrine Er] Anxiety   Keflex [Cephalexin] Nausea And Vomiting    Tolerated Ancef   Penicillins Rash    Injection site reaction. Tolerated cefepime in past. Also reports tolerating a penicillin infusion after this initial rxn ~20 yrs ago.  Has patient had a PCN reaction causing immediate rash, facial/tongue/throat swelling, SOB or lightheadedness with hypotension: No Has patient had a PCN reaction causing severe rash involving mucus membranes or skin necrosis: No Has patient had a PCN reaction that required hospitalization No Has patient had a PCN reaction occurring within the last 10 years: No     Sulfa Antibiotics Nausea And Vomiting    Outpatient  Encounter Medications as of 02/20/2023  Medication Sig   amLODipine (NORVASC) 10 MG tablet Take 1 tablet (10 mg total) by mouth daily.   atorvastatin (LIPITOR) 20 MG tablet TAKE 1 TABLET BY MOUTH EVERY DAY   Biotin 5 MG TBDP Take 1 tablet (5 mg total) by mouth daily.   Cholecalciferol (VITAMIN D3) 125 MCG (5000 UT) CAPS TAKE 1 CAPSULE BY MOUTH EVERY DAY   colchicine 0.6 MG tablet TAKE 1 TABLET BY MOUTH EVERY DAY   CRANBERRY PO Take 1 capsule by mouth daily.   cycloSPORINE (RESTASIS) 0.05 % ophthalmic emulsion Place 1 drop into both eyes 2 (two) times daily.   diclofenac Sodium (VOLTAREN) 1 % GEL APPLY 2 TO 4 GRAMS TOPICALLY TO AFFECTED JOINTS UP TO 4 TIMES DAILY   fexofenadine (ALLEGRA) 180 MG tablet Take 1 tablet (180 mg total) by mouth daily.   furosemide (LASIX) 80 MG tablet Take 1 tablet (80 mg total) by mouth  daily.   lansoprazole (PREVACID) 30 MG capsule TAKE 1 CAPSULE BY MOUTH ONCE DAILY AT NOON   methylPREDNISolone (MEDROL DOSEPAK) 4 MG TBPK tablet Take 4 mg three times a day for 2 days, Then take 4 mg twice a day for one day, then take 4 mg for one day. Then resume home dose Prednisone.   montelukast (SINGULAIR) 10 MG tablet TAKE 1 TABLET BY MOUTH EVERYDAY AT BEDTIME   potassium chloride (MICRO-K) 10 MEQ CR capsule Take 2 capsules (20 mEq total) by mouth 2 (two) times daily.   traMADol (ULTRAM) 50 MG tablet Take 1 tablet (50 mg total) by mouth every 6 (six) hours as needed (pain).   No facility-administered encounter medications on file as of 02/20/2023.    Review of Systems  Constitutional: Negative.   HENT: Negative.    Respiratory: Negative.    Gastrointestinal: Negative.   Genitourinary: Negative.   Musculoskeletal:  Positive for arthralgias and back pain.  Neurological: Negative.   Psychiatric/Behavioral: Negative.    All other systems reviewed and are negative.   There were no vitals filed for this visit. There is no height or weight on file to calculate BMI. Physical  Exam Vitals and nursing note reviewed.  Constitutional:      Appearance: Normal appearance.  HENT:     Mouth/Throat:     Mouth: Mucous membranes are moist.     Pharynx: Oropharynx is clear.  Eyes:     Conjunctiva/sclera: Conjunctivae normal.     Pupils: Pupils are equal, round, and reactive to light.  Cardiovascular:     Rate and Rhythm: Normal rate and regular rhythm.  Pulmonary:     Effort: Pulmonary effort is normal.     Breath sounds: Normal breath sounds.  Abdominal:     General: Bowel sounds are normal.     Palpations: Abdomen is soft.  Neurological:     General: No focal deficit present.     Mental Status: She is alert and oriented to person, place, and time.  Psychiatric:        Mood and Affect: Mood normal.        Behavior: Behavior normal.     Labs reviewed: Basic Metabolic Panel: Recent Labs    05/28/22 0551 05/29/22 0419 05/30/22 0514 10/27/22 0945 02/12/23 1120 02/13/23 0435 02/14/23 0420  NA 140 140 143   < > 139 140 141  K 3.3* 4.1 3.9   < > 4.1 3.4* 3.2*  CL 99 102 104   < > 99 104 110  CO2 '30 29 30   '$ < > '27 25 23  '$ GLUCOSE 114* 178* 157*   < > 125* 104* 174*  BUN 9 20 24*   < > '17 14 20  '$ CREATININE 0.83 0.85 0.87   < > 1.09* 0.88 0.85  CALCIUM 9.5 9.5 9.1   < > 9.4 9.2 9.1  MG 2.0 2.3 2.2  --   --   --   --    < > = values in this interval not displayed.   Liver Function Tests: Recent Labs    10/27/22 0945 10/28/22 0357 02/12/23 1120  AST '25 19 26  '$ ALT '17 14 15  '$ ALKPHOS 61 47 61  BILITOT 1.4* 1.1 1.4*  PROT 7.6 6.3* 7.9  ALBUMIN 3.7 2.7* 3.8   Recent Labs    05/21/22 1205  LIPASE 22   No results for input(s): "AMMONIA" in the last 8760 hours. CBC: Recent Labs    10/28/22 0357  10/29/22 0343 10/30/22 1216 02/12/23 1120 02/13/23 0435 02/14/23 0420  WBC 17.1* 13.6*   < > 15.7* 12.4* 9.1  NEUTROABS 14.6* 10.1*  --  10.8*  --   --   HGB 10.9* 9.8*   < > 13.7 11.4* 10.5*  HCT 35.8* 32.8*   < > 44.7 38.5 34.8*  MCV 98.6 99.7    < > 96.5 97.2 95.1  PLT 240 221   < > 278 250 251   < > = values in this interval not displayed.   Cardiac Enzymes: No results for input(s): "CKTOTAL", "CKMB", "CKMBINDEX", "TROPONINI" in the last 8760 hours. BNP: Invalid input(s): "POCBNP" Lab Results  Component Value Date   HGBA1C 6.7 (H) 02/15/2023   Lab Results  Component Value Date   TSH 0.865 02/27/2022   Lab Results  Component Value Date   [id number] (H) 05/26/2022   Lab Results  Component Value Date   FOLATE 26.8 02/06/2018   Lab Results  Component Value Date   IRON 89 02/06/2018   TIBC 281 02/06/2018   FERRITIN 74 02/06/2018    Imaging and Procedures obtained prior to SNF admission: MR LUMBAR SPINE WO CONTRAST  Result Date: 02/13/2023 CLINICAL DATA:  Lumbar radiculopathy EXAM: MRI LUMBAR SPINE WITHOUT CONTRAST TECHNIQUE: Multiplanar, multisequence MR imaging of the lumbar spine was performed. No intravenous contrast was administered. COMPARISON:  09/09/2019 FINDINGS: Evaluation is limited by motion. Segmentation: 5 lumbar type vertebral bodies. Alignment: Trace anterolisthesis of L5 on S1, unchanged. Mild levocurvature of the upper lumbar spine, with compensatory dextrocurvature of the lower lumbar spine. Vertebrae:  No fracture, evidence of discitis, or bone lesion. Conus medullaris and cauda equina: Conus extends to the L1 level. Conus and cauda equina appear normal. Paraspinal and other soft tissues: Renal cysts, for which no follow-up is currently indicated. Atrophy of the paraspinous musculature. Disc levels: T12-L1: Disc height loss and mild disc bulge. Mild facet arthropathy. Mild spinal canal stenosis and moderate right neural foraminal narrowing, unchanged. L1-L2: Disc height loss and mild disc bulge moderate facet arthropathy. Ligamentum flavum hypertrophy. Mild-to-moderate spinal canal stenosis, unchanged. Narrowing of the lateral recesses. No neural foraminal narrowing. L2-L3: Disc height loss and minimal  disc bulge with superimposed left subarticular disc extrusion with 13 mm caudal migration (series 5, image 11), which appears new from the prior exam. This contacts the descending left L3 nerve roots. Mild spinal canal stenosis. No significant neural foraminal narrowing. L3-L4: Prior fusion with interbody disc spacer. Mild facet arthropathy. Ligamentum flavum hypertrophy. Narrowing of the lateral recesses. Mild spinal canal stenosis and mild bilateral neural foraminal narrowing, unchanged. L4-L5: Prior fusion with interbody disc spacer. Mild facet arthropathy. Narrowing of the lateral recesses. No spinal canal stenosis. Mild bilateral neural foraminal narrowing, unchanged. L5-S1: Trace anterolisthesis with disc unroofing and small central disc protrusion. Moderate facet arthropathy. No spinal canal stenosis or neural foraminal narrowing. IMPRESSION: 1. Evaluation is limited by motion. Within this limitation, there is a new left subarticular disc extrusion at L2-L3 with caudal migration, which contacts the descending left L3 nerve roots. 2. L1-L2 mild-to-moderate spinal canal stenosis, unchanged. 3. T12-L1 mild spinal canal stenosis and moderate right neural foraminal narrowing, unchanged. 4. L3-L4 mild spinal canal stenosis and mild bilateral neural foraminal narrowing, unchanged. L4-L5 mild bilateral neural foraminal narrowing, unchanged. 5. Narrowing of the lateral recesses at L1-L2, L3-L4, and L4-L5 could affect the descending L2, L4, and L5 nerve roots, respectively. Electronically Signed   By: Merilyn Baba M.D.   On: 02/13/2023  02:45   CT HEAD WO CONTRAST (5MM)  Result Date: 02/12/2023 CLINICAL DATA:  Neuro deficit, acute, stroke suspected EXAM: CT HEAD WITHOUT CONTRAST TECHNIQUE: Contiguous axial images were obtained from the base of the skull through the vertex without intravenous contrast. RADIATION DOSE REDUCTION: This exam was performed according to the departmental dose-optimization program which  includes automated exposure control, adjustment of the mA and/or kV according to patient size and/or use of iterative reconstruction technique. COMPARISON:  None Available. FINDINGS: Brain: No evidence of acute large vascular territory infarction, hemorrhage, hydrocephalus, extra-axial collection or mass lesion/mass effect. Moderate patchy white matter hypodensities, nonspecific but compatible with chronic microvascular ischemic disease. Cerebral atrophy Vascular: No hyperdense vessel. Skull: No acute fracture. Sinuses/Orbits: Mild paranasal sinus mucosal thickening. No acute orbital findings. Other: No mastoid effusions. IMPRESSION: 1. No evidence of acute intracranial abnormality. 2. Moderate chronic microvascular ischemic disease. Electronically Signed   By: Margaretha Sheffield M.D.   On: 02/12/2023 17:54   CT ABDOMEN PELVIS W CONTRAST  Result Date: 02/12/2023 CLINICAL DATA:  89 year old female status post fall from toilet this morning. Abdominal pain. EXAM: CT ABDOMEN AND PELVIS WITH CONTRAST TECHNIQUE: Multidetector CT imaging of the abdomen and pelvis was performed using the standard protocol following bolus administration of intravenous contrast. RADIATION DOSE REDUCTION: This exam was performed according to the departmental dose-optimization program which includes automated exposure control, adjustment of the mA and/or kV according to patient size and/or use of iterative reconstruction technique. CONTRAST:  189m OMNIPAQUE IOHEXOL 300 MG/ML  SOLN COMPARISON:  CT Abdomen and Pelvis 05/21/2022. FINDINGS: Lower chest: Lung bases are stable from last year with combined mild atelectasis, scarring, and also chronic postinflammatory small lung nodularity. No pericardial effusion, pleural effusion, or acute lung base opacity. Hepatobiliary: Liver and gallbladder appear stable and negative. Pancreas: Chronic pancreatic atrophy. Spleen: Negative. Adrenals/Urinary Tract: Normal adrenal glands. Kidneys are stable and  nonobstructed. Small chronic benign appearing renal cysts (no follow-up imaging recommended). Symmetric renal enhancement and contrast excretion. No hydroureter. Unremarkable bladder. Stomach/Bowel: Chronic sigmoid diverticulosis, maximal in the anterior sigmoid and junction with the descending colon. That segment appears stable from the CT last year, no definite active inflammation. Mild large bowel redundancy and retained stool otherwise. Cecum located in the midline today. Elongated but normal appendix tracking to the right (series 2, image 58). No dilated small bowel. Small gastric hiatal hernia or phrenic ampulla (normal variant). Otherwise decompressed stomach and duodenum. No free air or free fluid identified. Stable chronic rectus muscle diastasis. Vascular/Lymphatic: Aortoiliac calcified atherosclerosis. Normal abdominal aortic caliber. Major arterial structures remain patent. Portal venous system is patent. No lymphadenopathy identified. Reproductive: Stable simple 3.8 cm left ovarian cyst since at least 2019 (no follow-up imaging recommended). Diminutive or absent uterus. Negative right ovary. Other: No pelvis free fluid. Musculoskeletal: Advanced chronic spine degeneration and previous lumbar interbody fusion. Background osteopenia. 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 No acute osseous abnormality identified. But evidence of a new caudal left lateral recess disc extrusion since last year at L2-L3 (series 2, image 36). IMPRESSION: 1. No acute traumatic injury identified. No acute or inflammatory process identified in the abdomen or pelvis. 2. 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 Query left L3 radiculitis. 3.  Aortic Atherosclerosis (ICD10-I70.0). Electronically Signed   By: HGenevie AnnM.D.   On: 02/12/2023 13:04   DG Chest Port 1 View  Result Date: 02/12/2023 CLINICAL DATA:  Questionable sepsis EXAM: PORTABLE  CHEST 1  VIEW COMPARISON:  10/30/2022 FINDINGS: Low volume chest. There is no edema, consolidation, effusion, or pneumothorax. Normal heart size and mediastinal contours. Artifact from EKG leads. IMPRESSION: Stable low volume chest.  No acute finding. Electronically Signed   By: Jorje Guild M.D.   On: 02/12/2023 11:08   DG Foot Complete Left  Result Date: 02/12/2023 CLINICAL DATA:  Fall.  Questionable sepsis. EXAM: LEFT FOOT - COMPLETE 3+ VIEW COMPARISON:  10/30/2022 FINDINGS: There is no evidence of fracture or dislocation. There is no evidence of arthropathy or other focal bone abnormality. Soft tissues are unremarkable. IMPRESSION: Negative. Electronically Signed   By: Jorje Guild M.D.   On: 02/12/2023 11:07    Assessment/Plan 1. Acute metabolic encephalopathy Symptoms resolved with IV hydration; today at my exam she is back to her baseline  2. AKI (acute kidney injury) ([place]) Labs resolved with IV hydration  3. Chronic diastolic heart failure ([place]) Does have dependent edema and is on Lasix chronically.  No shortness of breath  4. Edema of both lower extremities due to peripheral venous insufficiency Patient with furosemide.  Will continue  5. Long term current use of systemic steroids, for RA Right has had been in remission.  Is followed by rheumatology.  Steroids were increased at the hospital as a result of order from neurosurgeon for her lumbar radiculitis  6. 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  Not a surgical candidate  7. Rheumatoid arthritis involving multiple sites with positive rheumatoid factor ([place]) Symptoms involve right hand primarily we will continue with steroids as before    Family/ staff Communication:   Labs/tests ordered:  Lillette Boxer. Sabra Heck, [place] [address] [place], [place] Office [phone]

## 2023-02-20 NOTE — Assessment & Plan Note (Signed)
02/20/23 Na 142, K 4.0, Bun 32, creat 1.02, wbc 12.0, Hgb 12.5, plt 345, neutrophils 74.1

## 2023-02-21 ENCOUNTER — Encounter (INDEPENDENT_AMBULATORY_CARE_PROVIDER_SITE_OTHER): Payer: Medicare PPO | Admitting: Family Medicine

## 2023-02-22 ENCOUNTER — Encounter: Payer: Medicare PPO | Admitting: Nurse Practitioner

## 2023-02-23 NOTE — Progress Notes (Signed)
This encounter was created in error - please disregard.

## 2023-02-28 ENCOUNTER — Encounter: Payer: Medicare PPO | Admitting: Family Medicine

## 2023-02-28 NOTE — Progress Notes (Signed)
Error

## 2023-03-02 ENCOUNTER — Non-Acute Institutional Stay (SKILLED_NURSING_FACILITY): Payer: Medicare PPO | Admitting: Nurse Practitioner

## 2023-03-02 ENCOUNTER — Encounter: Payer: Self-pay | Admitting: Nurse Practitioner

## 2023-03-02 DIAGNOSIS — I5032 Chronic diastolic (congestive) heart failure: Secondary | ICD-10-CM | POA: Diagnosis not present

## 2023-03-02 DIAGNOSIS — R000 Tachycardia, unspecified: Secondary | ICD-10-CM

## 2023-03-02 DIAGNOSIS — K5720 Diverticulitis of large intestine with perforation and abscess without bleeding: Secondary | ICD-10-CM

## 2023-03-02 DIAGNOSIS — I872 Venous insufficiency (chronic) (peripheral): Secondary | ICD-10-CM

## 2023-03-02 DIAGNOSIS — M0579 Rheumatoid arthritis with rheumatoid factor of multiple sites without organ or systems involvement: Secondary | ICD-10-CM

## 2023-03-02 DIAGNOSIS — J45909 Unspecified asthma, uncomplicated: Secondary | ICD-10-CM | POA: Diagnosis not present

## 2023-03-02 DIAGNOSIS — E538 Deficiency of other specified B group vitamins: Secondary | ICD-10-CM

## 2023-03-02 DIAGNOSIS — M8580 Other specified disorders of bone density and structure, unspecified site: Secondary | ICD-10-CM | POA: Diagnosis not present

## 2023-03-02 DIAGNOSIS — E782 Mixed hyperlipidemia: Secondary | ICD-10-CM

## 2023-03-02 DIAGNOSIS — E876 Hypokalemia: Secondary | ICD-10-CM | POA: Diagnosis not present

## 2023-03-02 DIAGNOSIS — Z86718 Personal history of other venous thrombosis and embolism: Secondary | ICD-10-CM

## 2023-03-02 DIAGNOSIS — K219 Gastro-esophageal reflux disease without esophagitis: Secondary | ICD-10-CM

## 2023-03-02 DIAGNOSIS — M109 Gout, unspecified: Secondary | ICD-10-CM

## 2023-03-02 NOTE — Assessment & Plan Note (Signed)
Chronic diastolic heart failure, DOE, on and off wheezing, cough, occasional yellow phlegm in am. CXR no evidence of acute infection 10/2022. EF 60-65% 05/22/22, on Furosemide, Amlodipine 

## 2023-03-02 NOTE — Assessment & Plan Note (Signed)
K 4.0 02/20/23

## 2023-03-02 NOTE — Assessment & Plan Note (Signed)
Rheumatology, off Alendronate, on Vit D, t score -1.8 06/06/21 

## 2023-03-02 NOTE — Assessment & Plan Note (Signed)
Hx of aute sigmoid diverticulitis with pneumoperitoneum, f/u Dr. Kinsinger, diarrhea on and off, negative C-diff while in hospital.  

## 2023-03-02 NOTE — Progress Notes (Unsigned)
Location:   [facility] [place] Room Number: 22-A Place of Service:  SNF (31)  Provider: Marlana Latus NP  PCP: Annastasia Haskins X, NP Patient Care Team: Kailen Hinkle X, [name]  Extended Emergency Contact Information Primary Emergency Contact: [name] of [place] Phone: [phone] Mobile Phone: [phone] Relation: Daughter  Code Status: DNR Goals of care:  Advanced Directive information    03/02/2023    8:27 AM  Advanced Directives  Does Patient Have a Medical Advance Directive? Yes  Type of Paramedic of [place];Living will  Does patient want to make changes to medical advance directive? No - Patient declined  Copy of [place] in Chart? Yes - validated most recent copy scanned in chart (See row information)     Allergies  Allergen Reactions   Adhesive [Tape] Rash   Celebrex [Celecoxib] Hives   Ciprofibrate Nausea Only   Cymbalta [Duloxetine Hcl] Swelling   Gabitril [Tiagabine] Swelling   Lyrica [Pregabalin] Swelling   Neurontin [Gabapentin] Swelling   Nexium [Esomeprazole] Rash   Nsaids Rash   Shrimp [Shellfish Allergy] Anaphylaxis    Per patient "shrimp only"   Azactam [Aztreonam]     Hand swelling    Azelastine Hcl     Rash    Ciprofloxacin Other (See Comments)    dizziness   Claritin [Loratadine]     Irritability Nervousness    Methotrexate Derivatives    Nasacort [Triamcinolone]     Dizzy    Olopatadine Other (See Comments)    Pain and lethargy    Other    Sulfamethizole Other (See Comments)    unknown   Zantac [Ranitidine Hcl] Other (See Comments)    unknown   Claritin-D 12 Hour [Loratadine-Pseudoephedrine Er] Anxiety   Keflex [Cephalexin] Nausea And Vomiting    Tolerated Ancef   Penicillins Rash    Injection site reaction. Tolerated cefepime in past. Also reports tolerating a penicillin infusion after this initial rxn ~20 yrs ago.  Has patient  had a PCN reaction causing immediate rash, facial/tongue/throat swelling, SOB or lightheadedness with hypotension: No Has patient had a PCN reaction causing severe rash involving mucus membranes or skin necrosis: No Has patient had a PCN reaction that required hospitalization No Has patient had a PCN reaction occurring within the last 10 years: No     Sulfa Antibiotics Nausea And Vomiting    Chief Complaint  Patient presents with   Discharge Note    Discharge from SNF    HPI:  89 y.o. 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 Takes Tramadol prn. Diclofenac, MRI completed, Neurosurgery consulted, not a good surgical candidate. Her mentation has returned to her baseline, Leukocytosis resolved, negative UA, blood culture, CT abd/pelvis, and CT head.   The patient's back pain has been better controlled, able to ambulate with walker, w/c to go further. The patient has regained her physical strength, ADL function, she is ready to transition to [facility] for continue therapy.                Hgb a1c 6.7, prediabetic, on Steroids, monitor CBG  The right middle finger stiffness, difficulty eating.              Chronic diastolic heart failure, DOE, on and off wheezing, cough, occasional yellow phlegm in am. CXR no evidence of acute infection 10/2022. EF 60-65% 05/22/22, on Furosemide, Amlodipine             Hypokalemia, K 4.0 02/20/23             Hx of sinus tachycardia, heart rate is in control.              Hx of aute sigmoid diverticulitis with pneumoperitoneum, f/u Dr. [name], diarrhea on and off, negative C-diff while in hospital.               Asthma Epinephrine prn, Allegra, Flonase, Singulair             RA/fibromyalgia, taking steroids, and Tramadol, TSH 0.865 02/27/22             GERD off acid reducer. Hgb 12.5 02/20/23             Venous insufficiency/Edema , takes Furosemide, Kcl.             OP Rheumatology, off Alendronate, on Vit D, t score -1.8 06/06/21             Hyperlipidemia, takes Atorvastatin             Vit B12 deficiency, on Vit B12 po, Vit B12 235 06/20/21             CT left adnexa cyst 11/2020, MRI 05/08/21,  05/22/22 renal US shoed No evidence of renal mass or hydronephrosis.             Gout, R ankle, stable, off  Colchicine             Hx of DVT, venous US BLE was negative for DVT while in hospital. Off Eliquis.         Past Medical History:  Diagnosis Date   Adrenal failure ([place])    Arthritis    Asthma    Cancer ([place])    Cataract    Closed nondisplaced fracture of fifth right metatarsal bone 09/18/2017   Diverticulitis    Per patient   E coli bacteremia    Fibromyalgia 2008   GERD (gastroesophageal reflux disease) 12/18/2016   HCAP (healthcare-associated pneumonia) 02/03/2018   Hyperlipidemia 10/13/2021   Osteoporosis    RA (rheumatoid arthritis) ([place])    Recurrent upper respiratory infection (URI)    Sepsis due to urinary tract infection ([place]) 01/18/2018   Urticaria     Past Surgical History:  Procedure Laterality Date   BACK SURGERY     BILATERAL CARPAL TUNNEL RELEASE  2005   right and left   CATARACT EXTRACTION, BILATERAL  2004   right and left   CERVICAL FUSION  2011,2010,2008   2 disks   HEEL SPUR SURGERY  2004   lower back fusion  2011   Fusion of 3-4 and 4-5 lower back   RADIOFREQUENCY ABLATION  2020   ROTATOR CUFF REPAIR  [id number]   SQUAMOUS CELL CARCINOMA EXCISION     TONSILLECTOMY AND ADENOIDECTOMY  1947   TOTAL SHOULDER ARTHROPLASTY        reports that she quit smoking about 56 years ago. Her smoking use included cigarettes. She has a 7.50 pack-year smoking  history. She has never been exposed to tobacco smoke. She has never used smokeless tobacco. She reports  that she does not drink alcohol and does not use drugs. Social History   Socioeconomic History   Marital status: Widowed    Spouse name: Not on file   Number of children: 2   Years of education: Not on file   Highest education level: Not on file  Occupational History   Occupation: Retired Education officer, museum  Tobacco Use   Smoking status: Former    Packs/day: 0.75    Years: 10.00    Additional pack years: 0.00    Total pack years: 7.50    Types: Cigarettes    Quit date: 1968    Years since quitting: 56.2    Passive exposure: Never   Smokeless tobacco: Never  Vaping Use   Vaping Use: Never used  Substance and Sexual Activity   Alcohol use: No   Drug use: No   Sexual activity: Not Currently  Other Topics Concern   Not on file  Social History Narrative      Diet:        Do you drink/ eat things with caffeine? Dr. Malachi Bonds 2/ day      Marital status: Widowed                              What year were you married ? 1953      Do you live in a house, apartment,assistred living, condo, trailer, etc.)? Apartment      Is it one or more stories?       How many persons live in your home ? 1      Do you have any pets in your home ?(please list) No      Highest Level of education completed: PhD       Current or past profession: Transport planner, Education officer, museum, Special Educator       Do you exercise?   No                           Type & how often       ADVANCED DIRECTIVES (Please bring copies)      Do you have a living will? Yes      Do you have a DNR form?                       If not, do you want to discuss one? Yes      Do you have signed POA?HPOA forms?                 If so, please bring to your appointment Tes      FUNCTIONAL STATUS- To be completed by Spouse / child / Staff       Do you have difficulty bathing or dressing yourself ? No      Do you have difficulty  preparing food or eating ? No      Do you have difficulty managing your mediation ? No      Do you have difficulty managing your finances ? No      Do you have difficulty affording your medication ? No      Social Determinants of Health   Financial Resource Strain: Not on file  Food Insecurity: No Food Insecurity (02/12/2023)   Hunger Vital Sign    Worried About Running Out of Food in the Last Year: Never true    Ran Out of  Food in the Last Year: Never true  Transportation Needs: No Transportation Needs (02/12/2023)   PRAPARE - Hydrologist (Medical): No    Lack of Transportation (Non-Medical): No  Physical Activity: Not on file  Stress: Not on file  Social Connections: Not on file  Intimate Partner Violence: Not At Risk (02/12/2023)   Humiliation, Afraid, Rape, and Kick questionnaire    Fear of Current or Ex-Partner: No    Emotionally Abused: No    Physically Abused: No    Sexually Abused: No   Functional Status Survey:    Allergies  Allergen Reactions   Adhesive [Tape] Rash   Celebrex [Celecoxib] Hives   Ciprofibrate Nausea Only   Cymbalta [Duloxetine Hcl] Swelling   Gabitril [Tiagabine] Swelling   Lyrica [Pregabalin] Swelling   Neurontin [Gabapentin] Swelling   Nexium [Esomeprazole] Rash   Nsaids Rash   Shrimp [Shellfish Allergy] Anaphylaxis    Per patient "shrimp only"   Azactam [Aztreonam]     Hand swelling    Azelastine Hcl     Rash    Ciprofloxacin Other (See Comments)    dizziness   Claritin [Loratadine]     Irritability Nervousness    Methotrexate Derivatives    Nasacort [Triamcinolone]     Dizzy    Olopatadine Other (See Comments)    Pain and lethargy    Other    Sulfamethizole Other (See Comments)    unknown   Zantac [Ranitidine Hcl] Other (See Comments)    unknown   Claritin-D 12 Hour [Loratadine-Pseudoephedrine Er] Anxiety   Keflex [Cephalexin] Nausea And Vomiting    Tolerated Ancef   Penicillins Rash    Injection  site reaction. Tolerated cefepime in past. Also reports tolerating a penicillin infusion after this initial rxn ~20 yrs ago.  Has patient had a PCN reaction causing immediate rash, facial/tongue/throat swelling, SOB or lightheadedness with hypotension: No Has patient had a PCN reaction causing severe rash involving mucus membranes or skin necrosis: No Has patient had a PCN reaction that required hospitalization No Has patient had a PCN reaction occurring within the last 10 years: No     Sulfa Antibiotics Nausea And Vomiting    Pertinent  Health Maintenance Due  Topic Date Due   INFLUENZA VACCINE  Completed   DEXA SCAN  Completed    Medications: Allergies as of 03/02/2023       Reactions   Adhesive [tape] Rash   Celebrex [celecoxib] Hives   Ciprofibrate Nausea Only   Cymbalta [duloxetine Hcl] Swelling   Gabitril [tiagabine] Swelling   Lyrica [pregabalin] Swelling   Neurontin [gabapentin] Swelling   Nexium [esomeprazole] Rash   Nsaids Rash   Shrimp [shellfish Allergy] Anaphylaxis   Per patient "shrimp only"   Azactam [aztreonam]    Hand swelling    Azelastine Hcl    Rash    Ciprofloxacin Other (See Comments)   dizziness   Claritin [loratadine]    Irritability Nervousness    Methotrexate Derivatives    Nasacort [triamcinolone]    Dizzy    Olopatadine Other (See Comments)   Pain and lethargy    Other    Sulfamethizole Other (See Comments)   unknown   Zantac [ranitidine Hcl] Other (See Comments)   unknown   Claritin-d 12 Hour [loratadine-pseudoephedrine Er] Anxiety   Keflex [cephalexin] Nausea And Vomiting   Tolerated Ancef   Penicillins Rash   Injection site reaction. Tolerated cefepime in past. Also reports tolerating a penicillin infusion after this initial  rxn ~20 yrs ago.  Has patient had a PCN reaction causing immediate rash, facial/tongue/throat swelling, SOB or lightheadedness with hypotension: No Has patient had a PCN reaction causing severe rash involving  mucus membranes or skin necrosis: No Has patient had a PCN reaction that required hospitalization No Has patient had a PCN reaction occurring within the last 10 years: No   Sulfa Antibiotics Nausea And Vomiting        Medication List        Accurate as of March 02, 2023 12:50 PM. If you have any questions, ask your nurse or doctor.          STOP taking these medications    methylPREDNISolone 4 MG Tbpk tablet Commonly known as: MEDROL DOSEPAK Stopped by: Nyssa Sayegh X Latonya Knight, NP       TAKE these medications    acetaminophen 325 MG tablet Commonly known as: TYLENOL Take 650 mg by mouth every 4 (four) hours as needed.   amLODipine 10 MG tablet Commonly known as: NORVASC Take 1 tablet (10 mg total) by mouth daily.   atorvastatin 20 MG tablet Commonly known as: LIPITOR TAKE 1 TABLET BY MOUTH EVERY DAY   Biotin 5 MG Tbdp Take 1 tablet (5 mg total) by mouth daily.   colchicine 0.6 MG tablet TAKE 1 TABLET BY MOUTH EVERY DAY   CRANBERRY PO Take 1 capsule by mouth daily.   cycloSPORINE 0.05 % ophthalmic emulsion Commonly known as: RESTASIS Place 1 drop into both eyes 2 (two) times daily.   diclofenac Sodium 1 % Gel Commonly known as: VOLTAREN APPLY 2 TO 4 GRAMS TOPICALLY TO AFFECTED JOINTS UP TO 4 TIMES DAILY   fexofenadine 180 MG tablet Commonly known as: ALLEGRA Take 1 tablet (180 mg total) by mouth daily.   furosemide 80 MG tablet Commonly known as: LASIX Take 1 tablet (80 mg total) by mouth daily.   lansoprazole 30 MG capsule Commonly known as: PREVACID TAKE 1 CAPSULE BY MOUTH ONCE DAILY AT NOON   montelukast 10 MG tablet Commonly known as: SINGULAIR TAKE 1 TABLET BY MOUTH EVERYDAY AT BEDTIME   potassium chloride 10 MEQ CR capsule Commonly known as: MICRO-K Take 2 capsules (20 mEq total) by mouth 2 (two) times daily.   predniSONE 5 MG tablet Commonly known as: DELTASONE Take 5 mg by mouth daily with breakfast.   traMADol 50 MG tablet Commonly known  as: ULTRAM Take 1 tablet (50 mg total) by mouth every 6 (six) hours as needed (pain).   Vitamin D3 125 MCG (5000 UT) capsule Generic drug: Cholecalciferol TAKE 1 CAPSULE BY MOUTH EVERY DAY        Review of Systems  Constitutional:  Negative for appetite change, fatigue and fever.  HENT:  Negative for congestion, sinus pressure and trouble swallowing.   Eyes:  Negative for visual disturbance.  Respiratory:  Positive for shortness of breath. Negative for cough, chest tightness and wheezing.        DOE, occasional yellow phlegm in am, hacking cough.   Cardiovascular:  Positive for leg swelling.  Gastrointestinal:  Negative for abdominal pain and constipation.       On and off diarrhea.   Genitourinary:  Negative for dysuria and urgency.       Incontinent of urine.   Musculoskeletal:  Positive for arthralgias, back pain, gait problem and myalgias.       Right lower back hip pain, travels down to the right leg. Left groin/hip region pain with movement, able to  walk if Tramadol use.   Skin:  Negative for color change.  Neurological:  Negative for speech difficulty, weakness and headaches.  Psychiatric/Behavioral:  Negative for confusion and sleep disturbance. The patient is not nervous/anxious.     Vitals:   03/02/23 0820  BP: (!) 129/99  Pulse: 75  Resp: 16  Temp: (!) 96 F (35.6 C)  SpO2: 95%  Weight: 185 lb 11.2 oz (84.2 kg)  Height: 5\' 2"  (1.575 m)   Body mass index is 33.96 kg/m. Physical Exam Vitals and nursing note reviewed.  Constitutional:      Appearance: Normal appearance.  HENT:     Head: Normocephalic and atraumatic.     Ears:     Comments: A small abrasion from hearing aid at the opening of the right external ear canal, not infected.     Nose: Nose normal.     Mouth/Throat:     Mouth: Mucous membranes are moist.     Pharynx: Oropharyngeal exudate present.  Eyes:     Extraocular Movements: Extraocular movements intact.     Conjunctiva/sclera:  Conjunctivae normal.     Pupils: Pupils are equal, round, and reactive to light.  Cardiovascular:     Rate and Rhythm: Normal rate and regular rhythm.     Heart sounds: No murmur heard. Pulmonary:     Effort: Pulmonary effort is normal.     Breath sounds: No rales.  Abdominal:     General: Bowel sounds are normal.     Palpations: Abdomen is soft.     Tenderness: There is no abdominal tenderness.  Musculoskeletal:        General: No tenderness.     Cervical back: Normal range of motion and neck supple.     Right lower leg: Edema present.     Left lower leg: Edema present.     Comments: 2-3 + edema BLE, L>R  Skin:    General: Skin is warm and dry.     Comments: Lots of moles.  Neurological:     General: No focal deficit present.     Mental Status: She is alert and oriented to person, place, and time. Mental status is at baseline.     Gait: Gait abnormal.  Psychiatric:        Mood and Affect: Mood normal.        Behavior: Behavior normal.        Thought Content: Thought content normal.        Judgment: Judgment normal.     Labs reviewed: Basic Metabolic Panel: Recent Labs    05/28/22 0551 05/29/22 0419 05/30/22 0514 10/27/22 0945 02/12/23 1120 02/13/23 0435 02/14/23 0420 02/20/23 0000  NA 140 140 143   < > 139 140 141 142  K 3.3* 4.1 3.9   < > 4.1 3.4* 3.2* 4.0  CL 99 102 104   < > 99 104 110 106  CO2 30 29 30    < > 27 25 23  26*  GLUCOSE 114* 178* 157*   < > 125* 104* 174*  --   BUN 9 20 24*   < > 17 14 20  32*  CREATININE 0.83 0.85 0.87   < > 1.09* 0.88 0.85 1.0  CALCIUM 9.5 9.5 9.1   < > 9.4 9.2 9.1 9.1  MG 2.0 2.3 2.2  --   --   --   --   --    < > = values in this interval not displayed.   Liver Function  Tests: Recent Labs    10/27/22 0945 10/28/22 0357 02/12/23 1120 02/20/23 0000  AST 25 19 26  12*  ALT 17 14 15 16   ALKPHOS 61 47 61 53  BILITOT 1.4* 1.1 1.4*  --   PROT 7.6 6.3* 7.9  --   ALBUMIN 3.7 2.7* 3.8 3.5   Recent Labs    05/21/22 1205   LIPASE 22   No results for input(s): "AMMONIA" in the last 8760 hours. CBC: Recent Labs    10/28/22 0357 10/29/22 0343 10/30/22 1216 02/12/23 1120 02/13/23 0435 02/14/23 0420 02/20/23 0000  WBC 17.1* 13.6*   < > 15.7* 12.4* 9.1 12.0  NEUTROABS 14.6* 10.1*  --  10.8*  --   --   --   HGB 10.9* 9.8*   < > 13.7 11.4* 10.5* 12.5  HCT 35.8* 32.8*   < > 44.7 38.5 34.8* 38  MCV 98.6 99.7   < > 96.5 97.2 95.1  --   PLT 240 221   < > 278 250 251 345   < > = values in this interval not displayed.   Cardiac Enzymes: No results for input(s): "CKTOTAL", "CKMB", "CKMBINDEX", "TROPONINI" in the last 8760 hours. BNP: Invalid input(s): "POCBNP" CBG: Recent Labs    02/15/23 2045 02/16/23 0748 02/16/23 1113  GLUCAP 197* 132* 150*    Procedures and Imaging Studies During Stay: DG Hand 2 View Right  Result Date: 02/14/2023 CLINICAL DATA:  Right hand pain after fall 2 days ago. EXAM: RIGHT HAND - 2 VIEW COMPARISON:  May 27, 2020. FINDINGS: There is no evidence of fracture or dislocation. Degenerative changes are seen involving the first interphalangeal joint as well as the second, third and fifth proximal interphalangeal joints. Soft tissues are unremarkable. IMPRESSION: Findings consistent with osteoarthritis involving multiple joints as described above. No acute abnormality seen. Electronically Signed   By: Marijo Conception M.D.   On: 02/14/2023 17:40   MR LUMBAR SPINE WO CONTRAST  Result Date: 02/13/2023 CLINICAL DATA:  Lumbar radiculopathy EXAM: MRI LUMBAR SPINE WITHOUT CONTRAST TECHNIQUE: Multiplanar, multisequence MR imaging of the lumbar spine was performed. No intravenous contrast was administered. COMPARISON:  09/09/2019 FINDINGS: Evaluation is limited by motion. Segmentation: 5 lumbar type vertebral bodies. Alignment: Trace anterolisthesis of L5 on S1, unchanged. Mild levocurvature of the upper lumbar spine, with compensatory dextrocurvature of the lower lumbar spine. Vertebrae:  No  fracture, evidence of discitis, or bone lesion. Conus medullaris and cauda equina: Conus extends to the L1 level. Conus and cauda equina appear normal. Paraspinal and other soft tissues: Renal cysts, for which no follow-up is currently indicated. Atrophy of the paraspinous musculature. Disc levels: T12-L1: Disc height loss and mild disc bulge. Mild facet arthropathy. Mild spinal canal stenosis and moderate right neural foraminal narrowing, unchanged. L1-L2: Disc height loss and mild disc bulge moderate facet arthropathy. Ligamentum flavum hypertrophy. Mild-to-moderate spinal canal stenosis, unchanged. Narrowing of the lateral recesses. No neural foraminal narrowing. L2-L3: Disc height loss and minimal disc bulge with superimposed left subarticular disc extrusion with 13 mm caudal migration (series 5, image 11), which appears new from the prior exam. This contacts the descending left L3 nerve roots. Mild spinal canal stenosis. No significant neural foraminal narrowing. L3-L4: Prior fusion with interbody disc spacer. Mild facet arthropathy. Ligamentum flavum hypertrophy. Narrowing of the lateral recesses. Mild spinal canal stenosis and mild bilateral neural foraminal narrowing, unchanged. L4-L5: Prior fusion with interbody disc spacer. Mild facet arthropathy. Narrowing of the lateral recesses. No spinal canal stenosis.  Mild bilateral neural foraminal narrowing, unchanged. L5-S1: Trace anterolisthesis with disc unroofing and small central disc protrusion. Moderate facet arthropathy. No spinal canal stenosis or neural foraminal narrowing. IMPRESSION: 1. Evaluation is limited by motion. Within this limitation, there is a new left subarticular disc extrusion at L2-L3 with caudal migration, which contacts the descending left L3 nerve roots. 2. L1-L2 mild-to-moderate spinal canal stenosis, unchanged. 3. T12-L1 mild spinal canal stenosis and moderate right neural foraminal narrowing, unchanged. 4. L3-L4 mild spinal canal  stenosis and mild bilateral neural foraminal narrowing, unchanged. L4-L5 mild bilateral neural foraminal narrowing, unchanged. 5. Narrowing of the lateral recesses at L1-L2, L3-L4, and L4-L5 could affect the descending L2, L4, and L5 nerve roots, respectively. Electronically Signed   By: Merilyn Baba M.D.   On: 02/13/2023 02:45   CT HEAD WO CONTRAST (5MM)  Result Date: 02/12/2023 CLINICAL DATA:  Neuro deficit, acute, stroke suspected EXAM: CT HEAD WITHOUT CONTRAST TECHNIQUE: Contiguous axial images were obtained from the base of the skull through the vertex without intravenous contrast. RADIATION DOSE REDUCTION: This exam was performed according to the departmental dose-optimization program which includes automated exposure control, adjustment of the mA and/or kV according to patient size and/or use of iterative reconstruction technique. COMPARISON:  None Available. FINDINGS: Brain: No evidence of acute large vascular territory infarction, hemorrhage, hydrocephalus, extra-axial collection or mass lesion/mass effect. Moderate patchy white matter hypodensities, nonspecific but compatible with chronic microvascular ischemic disease. Cerebral atrophy Vascular: No hyperdense vessel. Skull: No acute fracture. Sinuses/Orbits: Mild paranasal sinus mucosal thickening. No acute orbital findings. Other: No mastoid effusions. IMPRESSION: 1. No evidence of acute intracranial abnormality. 2. Moderate chronic microvascular ischemic disease. Electronically Signed   By: Margaretha Sheffield M.D.   On: 02/12/2023 17:54   CT ABDOMEN PELVIS W CONTRAST  Result Date: 02/12/2023 CLINICAL DATA:  89 year old female status post fall from toilet this morning. Abdominal pain. EXAM: CT ABDOMEN AND PELVIS WITH CONTRAST TECHNIQUE: Multidetector CT imaging of the abdomen and pelvis was performed using the standard protocol following bolus administration of intravenous contrast. RADIATION DOSE REDUCTION: This exam was performed according to the  departmental dose-optimization program which includes automated exposure control, adjustment of the mA and/or kV according to patient size and/or use of iterative reconstruction technique. CONTRAST:  163mL OMNIPAQUE IOHEXOL 300 MG/ML  SOLN COMPARISON:  CT Abdomen and Pelvis 05/21/2022. FINDINGS: Lower chest: Lung bases are stable from last year with combined mild atelectasis, scarring, and also chronic postinflammatory small lung nodularity. No pericardial effusion, pleural effusion, or acute lung base opacity. Hepatobiliary: Liver and gallbladder appear stable and negative. Pancreas: Chronic pancreatic atrophy. Spleen: Negative. Adrenals/Urinary Tract: Normal adrenal glands. Kidneys are stable and nonobstructed. Small chronic benign appearing renal cysts (no follow-up imaging recommended). Symmetric renal enhancement and contrast excretion. No hydroureter. Unremarkable bladder. Stomach/Bowel: Chronic sigmoid diverticulosis, maximal in the anterior sigmoid and junction with the descending colon. That segment appears stable from the CT last year, no definite active inflammation. Mild large bowel redundancy and retained stool otherwise. Cecum located in the midline today. Elongated but normal appendix tracking to the right (series 2, image 58). No dilated small bowel. Small gastric hiatal hernia or phrenic ampulla (normal variant). Otherwise decompressed stomach and duodenum. No free air or free fluid identified. Stable chronic rectus muscle diastasis. Vascular/Lymphatic: Aortoiliac calcified atherosclerosis. Normal abdominal aortic caliber. Major arterial structures remain patent. Portal venous system is patent. No lymphadenopathy identified. Reproductive: Stable simple 3.8 cm left ovarian cyst since at least 2019 (no follow-up imaging recommended).  Diminutive or absent uterus. Negative right ovary. Other: No pelvis free fluid. Musculoskeletal: Advanced chronic spine degeneration and previous lumbar interbody fusion.  Background osteopenia. 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 No acute osseous abnormality identified. But evidence of a new caudal left lateral recess disc extrusion since last year at L2-L3 (series 2, image 36). IMPRESSION: 1. No acute traumatic injury identified. No acute or inflammatory process identified in the abdomen or pelvis. 2. 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 Query left L3 radiculitis. 3.  Aortic Atherosclerosis (ICD10-I70.0). Electronically Signed   By: Genevie Ann M.D.   On: 02/12/2023 13:04   DG Chest Port 1 View  Result Date: 02/12/2023 CLINICAL DATA:  Questionable sepsis EXAM: PORTABLE CHEST 1 VIEW COMPARISON:  10/30/2022 FINDINGS: Low volume chest. There is no edema, consolidation, effusion, or pneumothorax. Normal heart size and mediastinal contours. Artifact from EKG leads. IMPRESSION: Stable low volume chest.  No acute finding. Electronically Signed   By: Jorje Guild M.D.   On: 02/12/2023 11:08   DG Foot Complete Left  Result Date: 02/12/2023 CLINICAL DATA:  Fall.  Questionable sepsis. EXAM: LEFT FOOT - COMPLETE 3+ VIEW COMPARISON:  10/30/2022 FINDINGS: There is no evidence of fracture or dislocation. There is no evidence of arthropathy or other focal bone abnormality. Soft tissues are unremarkable. IMPRESSION: Negative. Electronically Signed   By: Jorje Guild M.D.   On: 02/12/2023 11:07    Assessment/Plan:   No problem-specific Assessment & Plan notes found for this encounter.   Patient is being discharged with the following home health services:    Patient is being discharged with the following durable medical equipment:    Patient has been advised to f/u with their PCP in 1-2 weeks to bring them up to date on their rehab stay.  Social services at facility was responsible for arranging this appointment.  Pt was provided with a 30 day supply of prescriptions for medications and refills  must be obtained from their PCP.  For controlled substances, a more limited supply may be provided adequate until PCP appointment only.  Future labs/tests needed:  BMP 1 week

## 2023-03-02 NOTE — Assessment & Plan Note (Signed)
off acid reducer. Hgb 12.5 02/20/23

## 2023-03-02 NOTE — Assessment & Plan Note (Signed)
Asthma Epinephrine prn, Allegra, Flonase, Singulair  

## 2023-03-02 NOTE — Assessment & Plan Note (Signed)
Hx of sinus tachycardia, heart rate is in control.  

## 2023-03-02 NOTE — Assessment & Plan Note (Signed)
R ankle, stable, takes Colchicine 

## 2023-03-02 NOTE — Assessment & Plan Note (Signed)
RA/fibromyalgia, taking steroids, and Tramadol, TSH 0.865 02/27/22 

## 2023-03-02 NOTE — Assessment & Plan Note (Signed)
takes Atorvastatin  

## 2023-03-02 NOTE — Assessment & Plan Note (Signed)
on Vit B12 po, Vit B12 235 06/20/21 

## 2023-03-02 NOTE — Assessment & Plan Note (Signed)
2-3 +BLE, L>R, will add Furosemide 40mg  2pm, continue 80mg  qd, increase Kcl 30mg  bid, f/u BMP one week.

## 2023-03-02 NOTE — Assessment & Plan Note (Signed)
Hx of DVT, venous US BLE was negative for DVT while in hospital. Off Eliquis.  

## 2023-03-04 ENCOUNTER — Encounter: Payer: Self-pay | Admitting: Nurse Practitioner

## 2023-03-05 DIAGNOSIS — R29898 Other symptoms and signs involving the musculoskeletal system: Secondary | ICD-10-CM | POA: Diagnosis not present

## 2023-03-05 DIAGNOSIS — R2689 Other abnormalities of gait and mobility: Secondary | ICD-10-CM | POA: Diagnosis not present

## 2023-03-05 DIAGNOSIS — M6281 Muscle weakness (generalized): Secondary | ICD-10-CM | POA: Diagnosis not present

## 2023-03-06 ENCOUNTER — Encounter: Payer: Self-pay | Admitting: Nurse Practitioner

## 2023-03-06 ENCOUNTER — Non-Acute Institutional Stay: Payer: Medicare PPO | Admitting: Nurse Practitioner

## 2023-03-06 DIAGNOSIS — Z86718 Personal history of other venous thrombosis and embolism: Secondary | ICD-10-CM | POA: Diagnosis not present

## 2023-03-06 DIAGNOSIS — I5032 Chronic diastolic (congestive) heart failure: Secondary | ICD-10-CM | POA: Diagnosis not present

## 2023-03-06 DIAGNOSIS — R29898 Other symptoms and signs involving the musculoskeletal system: Secondary | ICD-10-CM | POA: Diagnosis not present

## 2023-03-06 DIAGNOSIS — M6281 Muscle weakness (generalized): Secondary | ICD-10-CM | POA: Diagnosis not present

## 2023-03-06 DIAGNOSIS — M0579 Rheumatoid arthritis with rheumatoid factor of multiple sites without organ or systems involvement: Secondary | ICD-10-CM

## 2023-03-06 DIAGNOSIS — R2689 Other abnormalities of gait and mobility: Secondary | ICD-10-CM | POA: Diagnosis not present

## 2023-03-06 DIAGNOSIS — N390 Urinary tract infection, site not specified: Secondary | ICD-10-CM

## 2023-03-06 DIAGNOSIS — I872 Venous insufficiency (chronic) (peripheral): Secondary | ICD-10-CM

## 2023-03-06 DIAGNOSIS — R300 Dysuria: Secondary | ICD-10-CM

## 2023-03-06 NOTE — Assessment & Plan Note (Signed)
c/o burning sensation upon urination, admitted feeling bad in general, denied abd pain, urinary frequency or urgency, she is afebrile.  03/06/23 UA C/S to r/o UTI

## 2023-03-06 NOTE — Assessment & Plan Note (Signed)
Hx of DVT, venous US BLE was negative for DVT while in hospital. Off Eliquis.  

## 2023-03-06 NOTE — Progress Notes (Addendum)
Location:  [facility] Room Number: [phone] Place of Service:  ALF [phone]) Provider:  Jancarlos Thrun X, NP  Patient Care Team: Nirvana Blanchett X, [name]  Extended Emergency Contact Information Primary Emergency Contact: Noel Gerold [place] of [place] Phone: [phone] Mobile Phone: [phone] Relation: Daughter  Code Status:  Full Code Goals of care: Advanced Directive information    03/06/2023   12:03 PM  Advanced Directives  Does Patient Have a Medical Advance Directive? Yes  Type of Paramedic of [place];Living will  Does patient want to make changes to medical advance directive? No - Patient declined  Copy of [place] in Chart? Yes - validated most recent copy scanned in chart (See row information)     Chief Complaint  Patient presents with   Acute Visit    Burning in urination    HPI:  Pt is a 89 y.o. female seen today for an acute visit for c/o burning sensation upon urination, admitted feeling bad in general, denied abd pain, urinary frequency or urgency, she is afebrile.    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 Diclofenac, MRI completed, Neurosurgery consulted, not a good surgical candidate.                Hgb a1c 6.7, prediabetic, on Steroids, monitor CBG             The right middle finger stiffness, difficulty eating.              Chronic diastolic heart failure, DOE, on and off wheezing, cough, occasional yellow phlegm in am. CXR no evidence of acute infection 10/2022. EF 60-65% 05/22/22, on Furosemide, Amlodipine             Hypokalemia, K 4.0 02/20/23             Hx of sinus tachycardia, heart rate is in control.              Hx of aute sigmoid diverticulitis with pneumoperitoneum, f/u Dr. [name], diarrhea on and off, negative C-diff while in hospital.              Asthma  Epinephrine prn, Allegra, Flonase, Singulair             RA/fibromyalgia, taking steroids, and Tramadol, TSH 0.865 02/27/22             GERD off acid reducer. Hgb 12.5 02/20/23             Venous insufficiency/Edema , takes Furosemide, Kcl.             OP Rheumatology, off Alendronate, on Vit D, t score -1.8 06/06/21             Hyperlipidemia, takes Atorvastatin             Vit B12 deficiency, on Vit B12 po, Vit B12 235 06/20/21             CT left adnexa cyst 11/2020, MRI 05/08/21,  05/22/22 renal US shoed No evidence of renal mass or hydronephrosis.             Gout, R ankle, stable, off  Colchicine             Hx of DVT, venous US BLE was negative for DVT while in hospital. Off Eliquis.  Past Medical History:  Diagnosis Date   Adrenal failure ([place])    Arthritis    Asthma    Cancer ([place])    Cataract    Closed nondisplaced fracture of fifth right metatarsal bone 09/18/2017   Diverticulitis    Per patient   E coli bacteremia    Fibromyalgia 2008   GERD (gastroesophageal reflux disease) 12/18/2016   HCAP (healthcare-associated pneumonia) 02/03/2018   Hyperlipidemia 10/13/2021   Osteoporosis    RA (rheumatoid arthritis) (HCC)    Recurrent upper respiratory infection (URI)    Sepsis due to urinary tract infection ([place]) 01/18/2018   Urticaria    Past Surgical History:  Procedure Laterality Date   BACK SURGERY     BILATERAL CARPAL TUNNEL RELEASE  2005   right and left   CATARACT EXTRACTION, BILATERAL  2004   right and left   CERVICAL FUSION  2011,2010,2008   2 disks   HEEL SPUR SURGERY  2004   lower back fusion  2011   Fusion of 3-4 and 4-5 lower back   RADIOFREQUENCY ABLATION  2020   ROTATOR CUFF REPAIR  [id number]   SQUAMOUS CELL CARCINOMA EXCISION     TONSILLECTOMY AND ADENOIDECTOMY  1947   TOTAL SHOULDER ARTHROPLASTY      Allergies  Allergen Reactions   Adhesive [Tape] Rash   Celebrex [Celecoxib] Hives   Ciprofibrate Nausea Only   Cymbalta  [Duloxetine Hcl] Swelling   Gabitril [Tiagabine] Swelling   Lyrica [Pregabalin] Swelling   Neurontin [Gabapentin] Swelling   Nexium [Esomeprazole] Rash   Nsaids Rash   Shrimp [Shellfish Allergy] Anaphylaxis    Per patient "shrimp only"   Azactam [Aztreonam]     Hand swelling    Azelastine Hcl     Rash    Ciprofloxacin Other (See Comments)    dizziness   Claritin [Loratadine]     Irritability Nervousness    Methotrexate Derivatives    Nasacort [Triamcinolone]     Dizzy    Olopatadine Other (See Comments)    Pain and lethargy    Other    Sulfamethizole Other (See Comments)    unknown   Zantac [Ranitidine Hcl] Other (See Comments)    unknown   Claritin-D 12 Hour [Loratadine-Pseudoephedrine Er] Anxiety   Keflex [Cephalexin] Nausea And Vomiting    Tolerated Ancef   Penicillins Rash    Injection site reaction. Tolerated cefepime in past. Also reports tolerating a penicillin infusion after this initial rxn ~20 yrs ago.  Has patient had a PCN reaction causing immediate rash, facial/tongue/throat swelling, SOB or lightheadedness with hypotension: No Has patient had a PCN reaction causing severe rash involving mucus membranes or skin necrosis: No Has patient had a PCN reaction that required hospitalization No Has patient had a PCN reaction occurring within the last 10 years: No     Sulfa Antibiotics Nausea And Vomiting    Outpatient Encounter Medications as of 03/06/2023  Medication Sig   acetaminophen (TYLENOL) 325 MG tablet Take 650 mg by mouth every 4 (four) hours as needed.   amLODipine (NORVASC) 10 MG tablet Take 1 tablet (10 mg total) by mouth daily.   atorvastatin (LIPITOR) 20 MG tablet TAKE 1 TABLET BY MOUTH EVERY DAY   Biotin 5 MG TBDP Take 1 tablet (5 mg total) by mouth daily.   Cholecalciferol (VITAMIN D3) 125 MCG (5000 UT) CAPS TAKE 1 CAPSULE BY MOUTH EVERY DAY   colchicine 0.6 MG tablet TAKE 1 TABLET BY MOUTH EVERY DAY   CRANBERRY PO  Take 1 capsule by mouth daily.    cycloSPORINE (RESTASIS) 0.05 % ophthalmic emulsion Place 1 drop into both eyes 2 (two) times daily.   diclofenac Sodium (VOLTAREN) 1 % GEL APPLY 2 TO 4 GRAMS TOPICALLY TO AFFECTED JOINTS UP TO 4 TIMES DAILY   fexofenadine (ALLEGRA) 180 MG tablet Take 1 tablet (180 mg total) by mouth daily.   furosemide (LASIX) 80 MG tablet Take 1 tablet (80 mg total) by mouth daily.   lansoprazole (PREVACID) 30 MG capsule TAKE 1 CAPSULE BY MOUTH ONCE DAILY AT NOON   montelukast (SINGULAIR) 10 MG tablet TAKE 1 TABLET BY MOUTH EVERYDAY AT BEDTIME   potassium chloride (MICRO-K) 10 MEQ CR capsule Take 2 capsules (20 mEq total) by mouth 2 (two) times daily.   predniSONE (DELTASONE) 5 MG tablet Take 5 mg by mouth daily with breakfast.   traMADol (ULTRAM) 50 MG tablet Take 1 tablet (50 mg total) by mouth every 6 (six) hours as needed (pain).   No facility-administered encounter medications on file as of 03/06/2023.    Review of Systems  Constitutional:  Negative for appetite change, fatigue and fever.  HENT:  Negative for congestion, sinus pressure and trouble swallowing.   Eyes:  Negative for visual disturbance.  Respiratory:  Positive for shortness of breath. Negative for cough, chest tightness and wheezing.        DOE, occasional yellow phlegm in am, hacking cough.   Cardiovascular:  Positive for leg swelling.  Gastrointestinal:  Negative for abdominal pain, constipation, nausea and vomiting.       On and off diarrhea.   Genitourinary:  Positive for dysuria. Negative for frequency and urgency.       Incontinent of urine.   Musculoskeletal:  Positive for arthralgias, back pain, gait problem and myalgias.       Right lower back hip pain, travels down to the right leg. Left groin/hip region pain with movement, able to walk if Tramadol use.   Skin:  Negative for color change.  Neurological:  Negative for speech difficulty, weakness and headaches.  Psychiatric/Behavioral:  Negative for confusion and sleep  disturbance. The patient is not nervous/anxious.     Immunization History  Administered Date(s) Administered   Fluad Quad(high Dose 65+) 09/30/2020   Influenza, High Dose Seasonal PF 08/16/2018, 09/08/2019, 09/24/2020, 10/04/2021   Influenza,inj,Quad PF,6+ Mos 08/11/2016   Influenza-Unspecified 11/12/2017, 10/05/2022   Moderna Covid-19 Vaccine Bivalent Booster 12yrs & up 10/04/2021   Moderna Sars-Covid-2 Vaccination 12/15/2019, 01/12/2020, 10/19/2020   PNEUMOCOCCAL CONJUGATE-20 10/04/2021   PPD Test 03/01/2018   Pneumococcal Conjugate-13 06/26/2017   Pneumococcal Polysaccharide-23 12/11/2009   Zoster Recombinat (Shingrix) 01/28/2019, 09/02/2019   Pertinent  Health Maintenance Due  Topic Date Due   INFLUENZA VACCINE  07/12/2023   DEXA SCAN  Completed      01/03/2023    3:53 PM 01/25/2023    9:46 AM 01/31/2023    1:08 PM 02/19/2023    2:29 PM 03/06/2023   11:57 AM  [place] in the past year? 0 0 0 1 0  Was there an injury with Fall? 0 0 0 1 0  Fall Risk Category Calculator 0 0 0 3 0  Patient at Risk for Falls Due to Impaired mobility;Impaired balance/gait History of fall(s);Impaired balance/gait;Impaired mobility Impaired balance/gait;Impaired mobility History of fall(s);Impaired balance/gait History of fall(s);Impaired balance/gait  Fall risk Follow up Falls evaluation completed Falls evaluation completed Falls evaluation completed Falls evaluation completed Falls evaluation completed   Functional Status Survey:  Vitals:   03/06/23 1150  BP: (!) 142/70  Pulse: 72  Resp: 18  Temp: 98 F (36.7 C)  SpO2: 96%  Weight: 186 lb 6 oz (84.5 kg)  Height: 5\' 2"  (1.575 m)   Body mass index is 34.09 kg/m. Physical Exam Vitals and nursing note reviewed.  Constitutional:      Appearance: Normal appearance.  HENT:     Head: Normocephalic and atraumatic.     Ears:     Comments: A small abrasion from hearing aid at the opening of the right external ear canal, not  infected.     Nose: Nose normal.     Mouth/Throat:     Mouth: Mucous membranes are moist.  Eyes:     Extraocular Movements: Extraocular movements intact.     Conjunctiva/sclera: Conjunctivae normal.     Pupils: Pupils are equal, round, and reactive to light.  Cardiovascular:     Rate and Rhythm: Normal rate and regular rhythm.     Heart sounds: No murmur heard. Pulmonary:     Effort: Pulmonary effort is normal.     Breath sounds: No rales.  Abdominal:     General: Bowel sounds are normal.     Palpations: Abdomen is soft.     Tenderness: There is no abdominal tenderness. There is no right CVA tenderness, left CVA tenderness, guarding or rebound.  Musculoskeletal:        General: No tenderness.     Cervical back: Normal range of motion and neck supple.     Right lower leg: Edema present.     Left lower leg: Edema present.     Comments: 2+ edema BLE, L>R  Skin:    General: Skin is warm and dry.     Comments: Lots of moles. Chronic erythema venous insufficiency skin changes BLE  Neurological:     General: No focal deficit present.     Mental Status: She is alert and oriented to person, place, and time. Mental status is at baseline.     Gait: Gait abnormal.  Psychiatric:        Mood and Affect: Mood normal.        Behavior: Behavior normal.        Thought Content: Thought content normal.        Judgment: Judgment normal.     Labs reviewed: Recent Labs    05/28/22 0551 05/29/22 0419 05/30/22 0514 10/27/22 0945 02/12/23 1120 02/13/23 0435 02/14/23 0420 02/20/23 0000  NA 140 140 143   < > 139 140 141 142  K 3.3* 4.1 3.9   < > 4.1 3.4* 3.2* 4.0  CL 99 102 104   < > 99 104 110 106  CO2 30 29 30    < > 27 25 23  26*  GLUCOSE 114* 178* 157*   < > 125* 104* 174*  --   BUN 9 20 24*   < > 17 14 20  32*  CREATININE 0.83 0.85 0.87   < > 1.09* 0.88 0.85 1.0  CALCIUM 9.5 9.5 9.1   < > 9.4 9.2 9.1 9.1  MG 2.0 2.3 2.2  --   --   --   --   --    < > = values in this interval not  displayed.   Recent Labs    10/27/22 0945 10/28/22 0357 02/12/23 1120 02/20/23 0000  AST 25 19 26  12*  ALT 17 14 15 16   ALKPHOS 61 47 61 53  BILITOT 1.4* 1.1  1.4*  --   PROT 7.6 6.3* 7.9  --   ALBUMIN 3.7 2.7* 3.8 3.5   Recent Labs    10/28/22 0357 10/29/22 0343 10/30/22 1216 02/12/23 1120 02/13/23 0435 02/14/23 0420 02/20/23 0000  WBC 17.1* 13.6*   < > 15.7* 12.4* 9.1 12.0  NEUTROABS 14.6* 10.1*  --  10.8*  --   --   --   HGB 10.9* 9.8*   < > 13.7 11.4* 10.5* 12.5  HCT 35.8* 32.8*   < > 44.7 38.5 34.8* 38  MCV 98.6 99.7   < > 96.5 97.2 95.1  --   PLT 240 221   < > 278 250 251 345   < > = values in this interval not displayed.   Lab Results  Component Value Date   TSH 0.865 02/27/2022   Lab Results  Component Value Date   HGBA1C 6.7 (H) 02/15/2023   Lab Results  Component Value Date   CHOL 176 04/06/2020   HDL 67 04/06/2020   LDLCALC 82 04/06/2020   TRIG 171 (H) 04/06/2020   CHOLHDL 2.6 04/06/2020    Significant Diagnostic Results in last 30 days:  DG Hand 2 View Right  Result Date: 02/14/2023 CLINICAL DATA:  Right hand pain after fall 2 days ago. EXAM: RIGHT HAND - 2 VIEW COMPARISON:  May 27, 2020. FINDINGS: There is no evidence of fracture or dislocation. Degenerative changes are seen involving the first interphalangeal joint as well as the second, third and fifth proximal interphalangeal joints. Soft tissues are unremarkable. IMPRESSION: Findings consistent with osteoarthritis involving multiple joints as described above. No acute abnormality seen. Electronically Signed   By: Marijo Conception M.D.   On: 02/14/2023 17:40   MR LUMBAR SPINE WO CONTRAST  Result Date: 02/13/2023 CLINICAL DATA:  Lumbar radiculopathy EXAM: MRI LUMBAR SPINE WITHOUT CONTRAST TECHNIQUE: Multiplanar, multisequence MR imaging of the lumbar spine was performed. No intravenous contrast was administered. COMPARISON:  09/09/2019 FINDINGS: Evaluation is limited by motion. Segmentation: 5  lumbar type vertebral bodies. Alignment: Trace anterolisthesis of L5 on S1, unchanged. Mild levocurvature of the upper lumbar spine, with compensatory dextrocurvature of the lower lumbar spine. Vertebrae:  No fracture, evidence of discitis, or bone lesion. Conus medullaris and cauda equina: Conus extends to the L1 level. Conus and cauda equina appear normal. Paraspinal and other soft tissues: Renal cysts, for which no follow-up is currently indicated. Atrophy of the paraspinous musculature. Disc levels: T12-L1: Disc height loss and mild disc bulge. Mild facet arthropathy. Mild spinal canal stenosis and moderate right neural foraminal narrowing, unchanged. L1-L2: Disc height loss and mild disc bulge moderate facet arthropathy. Ligamentum flavum hypertrophy. Mild-to-moderate spinal canal stenosis, unchanged. Narrowing of the lateral recesses. No neural foraminal narrowing. L2-L3: Disc height loss and minimal disc bulge with superimposed left subarticular disc extrusion with 13 mm caudal migration (series 5, image 11), which appears new from the prior exam. This contacts the descending left L3 nerve roots. Mild spinal canal stenosis. No significant neural foraminal narrowing. L3-L4: Prior fusion with interbody disc spacer. Mild facet arthropathy. Ligamentum flavum hypertrophy. Narrowing of the lateral recesses. Mild spinal canal stenosis and mild bilateral neural foraminal narrowing, unchanged. L4-L5: Prior fusion with interbody disc spacer. Mild facet arthropathy. Narrowing of the lateral recesses. No spinal canal stenosis. Mild bilateral neural foraminal narrowing, unchanged. L5-S1: Trace anterolisthesis with disc unroofing and small central disc protrusion. Moderate facet arthropathy. No spinal canal stenosis or neural foraminal narrowing. IMPRESSION: 1. Evaluation is limited by motion.  Within this limitation, there is a new left subarticular disc extrusion at L2-L3 with caudal migration, which contacts the  descending left L3 nerve roots. 2. L1-L2 mild-to-moderate spinal canal stenosis, unchanged. 3. T12-L1 mild spinal canal stenosis and moderate right neural foraminal narrowing, unchanged. 4. L3-L4 mild spinal canal stenosis and mild bilateral neural foraminal narrowing, unchanged. L4-L5 mild bilateral neural foraminal narrowing, unchanged. 5. Narrowing of the lateral recesses at L1-L2, L3-L4, and L4-L5 could affect the descending L2, L4, and L5 nerve roots, respectively. Electronically Signed   By: Merilyn Baba M.D.   On: 02/13/2023 02:45   CT HEAD WO CONTRAST (5MM)  Result Date: 02/12/2023 CLINICAL DATA:  Neuro deficit, acute, stroke suspected EXAM: CT HEAD WITHOUT CONTRAST TECHNIQUE: Contiguous axial images were obtained from the base of the skull through the vertex without intravenous contrast. RADIATION DOSE REDUCTION: This exam was performed according to the departmental dose-optimization program which includes automated exposure control, adjustment of the mA and/or kV according to patient size and/or use of iterative reconstruction technique. COMPARISON:  None Available. FINDINGS: Brain: No evidence of acute large vascular territory infarction, hemorrhage, hydrocephalus, extra-axial collection or mass lesion/mass effect. Moderate patchy white matter hypodensities, nonspecific but compatible with chronic microvascular ischemic disease. Cerebral atrophy Vascular: No hyperdense vessel. Skull: No acute fracture. Sinuses/Orbits: Mild paranasal sinus mucosal thickening. No acute orbital findings. Other: No mastoid effusions. IMPRESSION: 1. No evidence of acute intracranial abnormality. 2. Moderate chronic microvascular ischemic disease. Electronically Signed   By: Margaretha Sheffield M.D.   On: 02/12/2023 17:54   CT ABDOMEN PELVIS W CONTRAST  Result Date: 02/12/2023 CLINICAL DATA:  89 year old female status post fall from toilet this morning. Abdominal pain. EXAM: CT ABDOMEN AND PELVIS WITH CONTRAST TECHNIQUE:  Multidetector CT imaging of the abdomen and pelvis was performed using the standard protocol following bolus administration of intravenous contrast. RADIATION DOSE REDUCTION: This exam was performed according to the departmental dose-optimization program which includes automated exposure control, adjustment of the mA and/or kV according to patient size and/or use of iterative reconstruction technique. CONTRAST:  145mL OMNIPAQUE IOHEXOL 300 MG/ML  SOLN COMPARISON:  CT Abdomen and Pelvis 05/21/2022. FINDINGS: Lower chest: Lung bases are stable from last year with combined mild atelectasis, scarring, and also chronic postinflammatory small lung nodularity. No pericardial effusion, pleural effusion, or acute lung base opacity. Hepatobiliary: Liver and gallbladder appear stable and negative. Pancreas: Chronic pancreatic atrophy. Spleen: Negative. Adrenals/Urinary Tract: Normal adrenal glands. Kidneys are stable and nonobstructed. Small chronic benign appearing renal cysts (no follow-up imaging recommended). Symmetric renal enhancement and contrast excretion. No hydroureter. Unremarkable bladder. Stomach/Bowel: Chronic sigmoid diverticulosis, maximal in the anterior sigmoid and junction with the descending colon. That segment appears stable from the CT last year, no definite active inflammation. Mild large bowel redundancy and retained stool otherwise. Cecum located in the midline today. Elongated but normal appendix tracking to the right (series 2, image 58). No dilated small bowel. Small gastric hiatal hernia or phrenic ampulla (normal variant). Otherwise decompressed stomach and duodenum. No free air or free fluid identified. Stable chronic rectus muscle diastasis. Vascular/Lymphatic: Aortoiliac calcified atherosclerosis. Normal abdominal aortic caliber. Major arterial structures remain patent. Portal venous system is patent. No lymphadenopathy identified. Reproductive: Stable simple 3.8 cm left ovarian cyst since at  least 2019 (no follow-up imaging recommended). Diminutive or absent uterus. Negative right ovary. Other: No pelvis free fluid. Musculoskeletal: Advanced chronic spine degeneration and previous lumbar interbody fusion. Background osteopenia. 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 No  acute osseous abnormality identified. But evidence of a new caudal left lateral recess disc extrusion since last year at L2-L3 (series 2, image 36). IMPRESSION: 1. No acute traumatic injury identified. No acute or inflammatory process identified in the abdomen or pelvis. 2. 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 Query left L3 radiculitis. 3.  Aortic Atherosclerosis (ICD10-I70.0). Electronically Signed   By: Genevie Ann M.D.   On: 02/12/2023 13:04   DG Chest Port 1 View  Result Date: 02/12/2023 CLINICAL DATA:  Questionable sepsis EXAM: PORTABLE CHEST 1 VIEW COMPARISON:  10/30/2022 FINDINGS: Low volume chest. There is no edema, consolidation, effusion, or pneumothorax. Normal heart size and mediastinal contours. Artifact from EKG leads. IMPRESSION: Stable low volume chest.  No acute finding. Electronically Signed   By: Jorje Guild M.D.   On: 02/12/2023 11:08   DG Foot Complete Left  Result Date: 02/12/2023 CLINICAL DATA:  Fall.  Questionable sepsis. EXAM: LEFT FOOT - COMPLETE 3+ VIEW COMPARISON:  10/30/2022 FINDINGS: There is no evidence of fracture or dislocation. There is no evidence of arthropathy or other focal bone abnormality. Soft tissues are unremarkable. IMPRESSION: Negative. Electronically Signed   By: Jorje Guild M.D.   On: 02/12/2023 11:07    Assessment/Plan Dysuria c/o burning sensation upon urination, admitted feeling bad in general, denied abd pain, urinary frequency or urgency, she is afebrile.  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 Diclofenac, MRI completed, Neurosurgery consulted, not a good surgical candidate.   Hx of RA/fibromyalgia, taking steroids, and Tramadol, TSH 0.865 02/27/22  Edema of both lower extremities due to peripheral venous insufficiency takes Furosemide, Kcl.  History of DVT (deep vein thrombosis) Hx of DVT, venous US BLE was negative for DVT while in hospital. Off Eliquis.     Chronic diastolic heart failure (HCC)    Chronic diastolic heart failure, DOE, on and off wheezing, cough, occasional yellow phlegm in am. CXR no evidence of acute infection 10/2022. EF 60-65% 05/22/22, on Furosemide, Amlodipine  UTI (urinary tract infection) c/o burning sensation upon urination, admitted feeling bad in general, denied abd pain, urinary frequency or urgency, she is afebrile.  03/06/23 UA C/S to r/o UTI 03/08/23 Na 142, K 3.4, Bun 11, creat 0.91 Urinary culture Proteus Hauseri >100,000c/ml, Cipro 250mg  bid x 3 days, referral to urology is desires.      Family/ staff Communication: plan of care reviewed with the patient and charge nurse.   Labs/tests ordered:  UA C/S  Time spend 40 minutes.

## 2023-03-06 NOTE — Assessment & Plan Note (Signed)
takes Furosemide, Kcl.

## 2023-03-06 NOTE — Assessment & Plan Note (Signed)
Chronic diastolic heart failure, DOE, on and off wheezing, cough, occasional yellow phlegm in am. CXR no evidence of acute infection 10/2022. EF 60-65% 05/22/22, on Furosemide, Amlodipine 

## 2023-03-06 NOTE — Assessment & Plan Note (Signed)
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 Diclofenac, MRI completed, Neurosurgery consulted, not a good surgical candidate.   Hx of RA/fibromyalgia, taking steroids, and Tramadol, TSH 0.865 02/27/22

## 2023-03-07 DIAGNOSIS — N390 Urinary tract infection, site not specified: Secondary | ICD-10-CM | POA: Diagnosis not present

## 2023-03-07 DIAGNOSIS — R0602 Shortness of breath: Secondary | ICD-10-CM | POA: Diagnosis not present

## 2023-03-07 DIAGNOSIS — M1380 Other specified arthritis, unspecified site: Secondary | ICD-10-CM | POA: Diagnosis not present

## 2023-03-07 DIAGNOSIS — M069 Rheumatoid arthritis, unspecified: Secondary | ICD-10-CM | POA: Diagnosis not present

## 2023-03-07 DIAGNOSIS — E2749 Other adrenocortical insufficiency: Secondary | ICD-10-CM | POA: Diagnosis not present

## 2023-03-07 DIAGNOSIS — M545 Low back pain, unspecified: Secondary | ICD-10-CM | POA: Diagnosis not present

## 2023-03-07 DIAGNOSIS — M810 Age-related osteoporosis without current pathological fracture: Secondary | ICD-10-CM | POA: Diagnosis not present

## 2023-03-08 DIAGNOSIS — R29898 Other symptoms and signs involving the musculoskeletal system: Secondary | ICD-10-CM | POA: Diagnosis not present

## 2023-03-08 DIAGNOSIS — I10 Essential (primary) hypertension: Secondary | ICD-10-CM | POA: Diagnosis not present

## 2023-03-08 DIAGNOSIS — R2689 Other abnormalities of gait and mobility: Secondary | ICD-10-CM | POA: Diagnosis not present

## 2023-03-08 DIAGNOSIS — M6281 Muscle weakness (generalized): Secondary | ICD-10-CM | POA: Diagnosis not present

## 2023-03-09 DIAGNOSIS — R29898 Other symptoms and signs involving the musculoskeletal system: Secondary | ICD-10-CM | POA: Diagnosis not present

## 2023-03-09 DIAGNOSIS — M6281 Muscle weakness (generalized): Secondary | ICD-10-CM | POA: Diagnosis not present

## 2023-03-09 DIAGNOSIS — R2689 Other abnormalities of gait and mobility: Secondary | ICD-10-CM | POA: Diagnosis not present

## 2023-03-12 ENCOUNTER — Telehealth: Payer: Self-pay | Admitting: Nurse Practitioner

## 2023-03-12 DIAGNOSIS — R2689 Other abnormalities of gait and mobility: Secondary | ICD-10-CM | POA: Diagnosis not present

## 2023-03-12 DIAGNOSIS — R29898 Other symptoms and signs involving the musculoskeletal system: Secondary | ICD-10-CM | POA: Diagnosis not present

## 2023-03-12 DIAGNOSIS — G9341 Metabolic encephalopathy: Secondary | ICD-10-CM | POA: Diagnosis not present

## 2023-03-12 NOTE — Assessment & Plan Note (Signed)
c/o burning sensation upon urination, admitted feeling bad in general, denied abd pain, urinary frequency or urgency, she is afebrile.  03/06/23 UA C/S to r/o UTI 03/08/23 Na 142, K 3.4, Bun 11, creat 0.91 Urinary culture Proteus Hauseri >100,000c/ml, Cipro 250mg  bid x 3 days, referral to urology is desires.

## 2023-03-12 NOTE — Telephone Encounter (Signed)
Late entry- while on all on 3/30 staff called for positive culture, sensitivities given,  Limited antibiotic options due to allergies. Patient reports mild allergy to cipro and would like to be placed on that vs IV antibiotics at this time.  Rx given for cipro 250 mg PO BID for 3 days with close monitoring for reaction and to call if any adverse reaction noted.  To give with food. Urology consult given.

## 2023-03-13 ENCOUNTER — Encounter: Payer: Self-pay | Admitting: Nurse Practitioner

## 2023-03-13 ENCOUNTER — Non-Acute Institutional Stay: Payer: Medicare PPO | Admitting: Nurse Practitioner

## 2023-03-13 DIAGNOSIS — G9341 Metabolic encephalopathy: Secondary | ICD-10-CM | POA: Diagnosis not present

## 2023-03-13 DIAGNOSIS — Z86718 Personal history of other venous thrombosis and embolism: Secondary | ICD-10-CM

## 2023-03-13 DIAGNOSIS — E876 Hypokalemia: Secondary | ICD-10-CM

## 2023-03-13 DIAGNOSIS — M8580 Other specified disorders of bone density and structure, unspecified site: Secondary | ICD-10-CM | POA: Diagnosis not present

## 2023-03-13 DIAGNOSIS — R000 Tachycardia, unspecified: Secondary | ICD-10-CM

## 2023-03-13 DIAGNOSIS — K219 Gastro-esophageal reflux disease without esophagitis: Secondary | ICD-10-CM

## 2023-03-13 DIAGNOSIS — J45909 Unspecified asthma, uncomplicated: Secondary | ICD-10-CM | POA: Diagnosis not present

## 2023-03-13 DIAGNOSIS — M0579 Rheumatoid arthritis with rheumatoid factor of multiple sites without organ or systems involvement: Secondary | ICD-10-CM

## 2023-03-13 DIAGNOSIS — N390 Urinary tract infection, site not specified: Secondary | ICD-10-CM

## 2023-03-13 DIAGNOSIS — I5032 Chronic diastolic (congestive) heart failure: Secondary | ICD-10-CM

## 2023-03-13 DIAGNOSIS — R2689 Other abnormalities of gait and mobility: Secondary | ICD-10-CM | POA: Diagnosis not present

## 2023-03-13 DIAGNOSIS — R29898 Other symptoms and signs involving the musculoskeletal system: Secondary | ICD-10-CM | POA: Diagnosis not present

## 2023-03-13 NOTE — Assessment & Plan Note (Signed)
taking steroids, and Tramadol, TSH 0.865 02/27/22

## 2023-03-13 NOTE — Assessment & Plan Note (Signed)
self stopped Cipro, c/o nausea, asymptomatic, will repeat UA C/S in setting of Hx of uroseptic event. Pending Urology referral.

## 2023-03-13 NOTE — Assessment & Plan Note (Signed)
Epinephrine prn, Allegra, Flonase, Singulair 

## 2023-03-13 NOTE — Assessment & Plan Note (Signed)
K 3.4 03/07/22, increased Kcl, f/u BMP

## 2023-03-13 NOTE — Progress Notes (Addendum)
Location:  [place] Room Number: [place] of Service:  ALF (36) Provider:Montserrath Madding X, NP  Patient Care Team: William Schake X, [name]  Extended Emergency Contact Information Primary Emergency Contact: [place] Phone: [phone] Mobile Phone: [phone] Relation: Daughter  Code Status:  Full Code Goals of care: Advanced Directive information    03/13/2023   10:49 AM  Advanced Directives  Does Patient Have a Medical Advance Directive? Yes  Type of Paramedic of [place];Living will  Does patient want to make changes to medical advance directive? No - Patient declined  Copy of [place] in Chart? Yes - validated most recent copy scanned in chart (See row information)     Chief Complaint  Patient presents with   Acute Visit    Swelling in both legs    HPI:  Pt is a 89 y.o. female seen today for an acute visit for persisted swelling legs.    UTI, self stopped Cipro, c/o nausea, asymptomatic, will repeat UA C/S in setting of Hx of uroseptic event.    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 Diclofenac, MRI completed, Neurosurgery consulted, not a good surgical candidate.                           Hgb a1c 6.7, prediabetic, on Steroids, monitor CBG             The right middle finger stiffness, difficulty eating.              Chronic diastolic heart failure, DOE, on and off wheezing, cough, occasional yellow phlegm in am. CXR no evidence of acute infection 10/2022. EF 60-65% 05/22/22, on Furosemide, Amlodipine             Hypokalemia, K 3.4 03/07/22, increased Kcl, f/u BMP              Hx of sinus tachycardia, heart rate is in control.              Hx of aute sigmoid diverticulitis with pneumoperitoneum, f/u Dr. [name], diarrhea on and off, negative C-diff while in  hospital.              Asthma Epinephrine prn, Allegra, Flonase, Singulair             RA/fibromyalgia, taking steroids, and Tramadol, TSH 0.865 02/27/22             GERD off acid reducer.              Venous insufficiency/Edema , takes Furosemide, Kcl.             OP Rheumatology, off Alendronate, on Vit D, t score -1.8 06/06/21             Hyperlipidemia, takes Atorvastatin             Vit B12 deficiency, on Vit B12 po, Vit B12 235 06/20/21             CT left adnexa cyst 11/2020, MRI 05/08/21,  05/22/22 renal US shoed No evidence of renal mass or hydronephrosis.             Gout, R ankle, stable, off  Colchicine             Hx of DVT, venous US BLE was negative  for DVT while in hospital. Off Eliquis.   Past Medical History:  Diagnosis Date   Adrenal failure    Arthritis    Asthma    Cancer    Cataract    Closed nondisplaced fracture of fifth right metatarsal bone 09/18/2017   Diverticulitis    Per patient   E coli bacteremia    Fibromyalgia 2008   GERD (gastroesophageal reflux disease) 12/18/2016   HCAP (healthcare-associated pneumonia) 02/03/2018   Hyperlipidemia 10/13/2021   Osteoporosis    RA (rheumatoid arthritis)    Recurrent upper respiratory infection (URI)    Sepsis due to urinary tract infection 01/18/2018   Urticaria    Past Surgical History:  Procedure Laterality Date   BACK SURGERY     BILATERAL CARPAL TUNNEL RELEASE  2005   right and left   CATARACT EXTRACTION, BILATERAL  2004   right and left   CERVICAL FUSION  2011,2010,2008   2 disks   HEEL SPUR SURGERY  2004   lower back fusion  2011   Fusion of 3-4 and 4-5 lower back   RADIOFREQUENCY ABLATION  2020   ROTATOR CUFF REPAIR  [id number]   SQUAMOUS CELL CARCINOMA EXCISION     TONSILLECTOMY AND ADENOIDECTOMY  1947   TOTAL SHOULDER ARTHROPLASTY      Allergies  Allergen Reactions   Adhesive [Tape] Rash   Celebrex [Celecoxib] Hives   Ciprofibrate Nausea Only   Cymbalta [Duloxetine Hcl] Swelling    Gabitril [Tiagabine] Swelling   Lyrica [Pregabalin] Swelling   Neurontin [Gabapentin] Swelling   Nexium [Esomeprazole] Rash   Nsaids Rash   Shrimp [Shellfish Allergy] Anaphylaxis    Per patient "shrimp only"   Azactam [Aztreonam]     Hand swelling    Azelastine Hcl     Rash    Ciprofloxacin Other (See Comments)    dizziness   Claritin [Loratadine]     Irritability Nervousness    Methotrexate Derivatives    Nasacort [Triamcinolone]     Dizzy    Olopatadine Other (See Comments)    Pain and lethargy    Other    Sulfamethizole Other (See Comments)    unknown   Zantac [Ranitidine Hcl] Other (See Comments)    unknown   Claritin-D 12 Hour [Loratadine-Pseudoephedrine Er] Anxiety   Keflex [Cephalexin] Nausea And Vomiting    Tolerated Ancef   Penicillins Rash    Injection site reaction. Tolerated cefepime in past. Also reports tolerating a penicillin infusion after this initial rxn ~20 yrs ago.  Has patient had a PCN reaction causing immediate rash, facial/tongue/throat swelling, SOB or lightheadedness with hypotension: No Has patient had a PCN reaction causing severe rash involving mucus membranes or skin necrosis: No Has patient had a PCN reaction that required hospitalization No Has patient had a PCN reaction occurring within the last 10 years: No     Sulfa Antibiotics Nausea And Vomiting    Outpatient Encounter Medications as of 03/13/2023  Medication Sig   acetaminophen (TYLENOL) 325 MG tablet Take 650 mg by mouth every 4 (four) hours as needed.   amLODipine (NORVASC) 10 MG tablet Take 1 tablet (10 mg total) by mouth daily.   atorvastatin (LIPITOR) 20 MG tablet TAKE 1 TABLET BY MOUTH EVERY DAY   Biotin 5 MG TBDP Take 1 tablet (5 mg total) by mouth daily.   Cholecalciferol (VITAMIN D3) 125 MCG (5000 UT) CAPS TAKE 1 CAPSULE BY MOUTH EVERY DAY   colchicine 0.6 MG tablet TAKE 1 TABLET BY MOUTH EVERY  DAY   CRANBERRY PO Take 1 capsule by mouth daily.   cycloSPORINE (RESTASIS) 0.05  % ophthalmic emulsion Place 1 drop into both eyes 2 (two) times daily.   diclofenac Sodium (VOLTAREN) 1 % GEL APPLY 2 TO 4 GRAMS TOPICALLY TO AFFECTED JOINTS UP TO 4 TIMES DAILY   fexofenadine (ALLEGRA) 180 MG tablet Take 1 tablet (180 mg total) by mouth daily.   furosemide (LASIX) 40 MG tablet Take 40 mg by mouth daily. Give one tablet by mouth one time a day   furosemide (LASIX) 80 MG tablet Take 1 tablet (80 mg total) by mouth daily.   lansoprazole (PREVACID) 30 MG capsule TAKE 1 CAPSULE BY MOUTH ONCE DAILY AT NOON   montelukast (SINGULAIR) 10 MG tablet TAKE 1 TABLET BY MOUTH EVERYDAY AT BEDTIME   potassium chloride (MICRO-K) 10 MEQ CR capsule Take 2 capsules (20 mEq total) by mouth 2 (two) times daily.   predniSONE (DELTASONE) 5 MG tablet Take 5 mg by mouth daily with breakfast.   traMADol (ULTRAM) 50 MG tablet Take 1 tablet (50 mg total) by mouth every 6 (six) hours as needed (pain).   No facility-administered encounter medications on file as of 03/13/2023.    Review of Systems  Constitutional:  Negative for appetite change, fatigue and fever.  HENT:  Negative for congestion, sinus pressure and trouble swallowing.   Eyes:  Negative for visual disturbance.  Respiratory:  Positive for shortness of breath. Negative for cough, chest tightness and wheezing.        DOE, occasional yellow phlegm in am, hacking cough.   Cardiovascular:  Positive for leg swelling.  Gastrointestinal:  Negative for abdominal pain, constipation, nausea and vomiting.       On and off diarrhea.   Genitourinary:  Negative for dysuria, frequency and urgency.       Incontinent of urine.   Musculoskeletal:  Positive for arthralgias, back pain, gait problem and myalgias.       Right lower back hip pain, travels down to the right leg. Left groin/hip region pain with movement, able to walk if Tramadol use.   Skin:  Negative for color change.  Neurological:  Negative for speech difficulty, weakness and headaches.   Psychiatric/Behavioral:  Negative for confusion and sleep disturbance. The patient is not nervous/anxious.     Immunization History  Administered Date(s) Administered   Fluad Quad(high Dose 65+) 09/30/2020   Influenza, High Dose Seasonal PF 08/16/2018, 09/08/2019, 09/24/2020, 10/04/2021   Influenza,inj,Quad PF,6+ Mos 08/11/2016   Influenza-Unspecified 11/12/2017, 10/05/2022   Moderna Covid-19 Vaccine Bivalent Booster 21yrs & up 10/04/2021   Moderna Sars-Covid-2 Vaccination 12/15/2019, 01/12/2020, 10/19/2020   PNEUMOCOCCAL CONJUGATE-20 10/04/2021   PPD Test 03/01/2018   Pneumococcal Conjugate-13 06/26/2017   Pneumococcal Polysaccharide-23 12/11/2009   Zoster Recombinat (Shingrix) 01/28/2019, 09/02/2019   Pertinent  Health Maintenance Due  Topic Date Due   INFLUENZA VACCINE  07/12/2023   DEXA SCAN  Completed      01/25/2023    9:46 AM 01/31/2023    1:08 PM 02/19/2023    2:29 PM 03/06/2023   11:57 AM 03/13/2023   10:48 AM  Fall Risk  Falls in the past year? 0 0 1 0 0  Was there an injury with Fall? 0 0 1 0 0  Fall Risk Category Calculator 0 0 3 0 0  Patient at Risk for Falls Due to History of fall(s);Impaired balance/gait;Impaired mobility Impaired balance/gait;Impaired mobility History of fall(s);Impaired balance/gait History of fall(s);Impaired balance/gait History of fall(s);Impaired balance/gait  Fall risk Follow up Falls evaluation completed Falls evaluation completed Falls evaluation completed Falls evaluation completed Falls evaluation completed   Functional Status Survey:    Vitals:   03/13/23 1031  BP: 116/70  Pulse: 93  Resp: 20  Temp: (!) 97.5 F (36.4 C)  SpO2: 91%  Weight: 187 lb 2 oz (84.9 kg)  Height: 5\' 2"  (1.575 m)   Body mass index is 34.23 kg/m. Physical Exam Vitals and nursing note reviewed.  Constitutional:      Appearance: Normal appearance.  HENT:     Head: Normocephalic and atraumatic.     Nose: Nose normal.     Mouth/Throat:     Mouth:  Mucous membranes are moist.  Eyes:     Extraocular Movements: Extraocular movements intact.     Conjunctiva/sclera: Conjunctivae normal.     Pupils: Pupils are equal, round, and reactive to light.  Cardiovascular:     Rate and Rhythm: Normal rate and regular rhythm.     Heart sounds: No murmur heard. Pulmonary:     Effort: Pulmonary effort is normal.     Breath sounds: No rales.  Abdominal:     General: Bowel sounds are normal.     Palpations: Abdomen is soft.     Tenderness: There is no abdominal tenderness.  Musculoskeletal:        General: No tenderness.     Cervical back: Normal range of motion and neck supple.     Right lower leg: Edema present.     Left lower leg: Edema present.     Comments: 2+ edema BLE, L>R  Skin:    General: Skin is warm and dry.     Comments: Lots of moles. Chronic erythema venous insufficiency skin changes BLE  Neurological:     General: No focal deficit present.     Mental Status: She is alert and oriented to person, place, and time. Mental status is at baseline.     Gait: Gait abnormal.  Psychiatric:        Mood and Affect: Mood normal.        Behavior: Behavior normal.        Thought Content: Thought content normal.        Judgment: Judgment normal.     Labs reviewed: Recent Labs    05/28/22 0551 05/29/22 0419 05/30/22 0514 10/27/22 0945 02/12/23 1120 02/13/23 0435 02/14/23 0420 02/20/23 0000  NA 140 140 143   < > 139 140 141 142  K 3.3* 4.1 3.9   < > 4.1 3.4* 3.2* 4.0  CL 99 102 104   < > 99 104 110 106  CO2 30 29 30    < > 27 25 23  26*  GLUCOSE 114* 178* 157*   < > 125* 104* 174*  --   BUN 9 20 24*   < > 17 14 20  32*  CREATININE 0.83 0.85 0.87   < > 1.09* 0.88 0.85 1.0  CALCIUM 9.5 9.5 9.1   < > 9.4 9.2 9.1 9.1  MG 2.0 2.3 2.2  --   --   --   --   --    < > = values in this interval not displayed.   Recent Labs    10/27/22 0945 10/28/22 0357 02/12/23 1120 02/20/23 0000  AST 25 19 26  12*  ALT 17 14 15 16   ALKPHOS 61 47  61 53  BILITOT 1.4* 1.1 1.4*  --   PROT 7.6 6.3* 7.9  --  ALBUMIN 3.7 2.7* 3.8 3.5   Recent Labs    10/28/22 0357 10/29/22 0343 10/30/22 1216 02/12/23 1120 02/13/23 0435 02/14/23 0420 02/20/23 0000  WBC 17.1* 13.6*   < > 15.7* 12.4* 9.1 12.0  NEUTROABS 14.6* 10.1*  --  10.8*  --   --   --   HGB 10.9* 9.8*   < > 13.7 11.4* 10.5* 12.5  HCT 35.8* 32.8*   < > 44.7 38.5 34.8* 38  MCV 98.6 99.7   < > 96.5 97.2 95.1  --   PLT 240 221   < > 278 250 251 345   < > = values in this interval not displayed.   Lab Results  Component Value Date   TSH 0.865 02/27/2022   Lab Results  Component Value Date   HGBA1C 6.7 (H) 02/15/2023   Lab Results  Component Value Date   CHOL 176 04/06/2020   HDL 67 04/06/2020   LDLCALC 82 04/06/2020   TRIG 171 (H) 04/06/2020   CHOLHDL 2.6 04/06/2020    Significant Diagnostic Results in last 30 days:  No results found.  Assessment/Plan There are no diagnoses linked to this encounter.   Family/ staff Communication: plan of care reviewed with the patient and charge nurse.   Labs/tests ordered: BMP one wk.  Repeat UA C/S if the patient desires.   Time spend 40 minutes.

## 2023-03-13 NOTE — Assessment & Plan Note (Signed)
Rheumatology, off Alendronate, on Vit D, t score -1.8 06/06/21 

## 2023-03-13 NOTE — Assessment & Plan Note (Signed)
Stable, off acid reducer 

## 2023-03-13 NOTE — Assessment & Plan Note (Signed)
heart rate is in control. 

## 2023-03-13 NOTE — Assessment & Plan Note (Signed)
Hx of DVT, venous US BLE was negative for DVT while in hospital. Off Eliquis.  

## 2023-03-13 NOTE — Assessment & Plan Note (Signed)
persisted swelling legs. Eileen Wilkerson, on and off wheezing, cough, occasional yellow phlegm in am. CXR no evidence of acute infection 10/2022. EF 60-65% 05/22/22, increase Furosemide 80mg  8am, 2pm, Kcl 10meq bid, reduce Amlodipine 5mg  qd, update BMP one week.

## 2023-03-14 DIAGNOSIS — R29898 Other symptoms and signs involving the musculoskeletal system: Secondary | ICD-10-CM | POA: Diagnosis not present

## 2023-03-14 DIAGNOSIS — G9341 Metabolic encephalopathy: Secondary | ICD-10-CM | POA: Diagnosis not present

## 2023-03-14 DIAGNOSIS — R2689 Other abnormalities of gait and mobility: Secondary | ICD-10-CM | POA: Diagnosis not present

## 2023-03-15 DIAGNOSIS — R29898 Other symptoms and signs involving the musculoskeletal system: Secondary | ICD-10-CM | POA: Diagnosis not present

## 2023-03-15 DIAGNOSIS — G9341 Metabolic encephalopathy: Secondary | ICD-10-CM | POA: Diagnosis not present

## 2023-03-15 DIAGNOSIS — R2689 Other abnormalities of gait and mobility: Secondary | ICD-10-CM | POA: Diagnosis not present

## 2023-03-19 DIAGNOSIS — R2689 Other abnormalities of gait and mobility: Secondary | ICD-10-CM | POA: Diagnosis not present

## 2023-03-19 DIAGNOSIS — R29898 Other symptoms and signs involving the musculoskeletal system: Secondary | ICD-10-CM | POA: Diagnosis not present

## 2023-03-19 DIAGNOSIS — G9341 Metabolic encephalopathy: Secondary | ICD-10-CM | POA: Diagnosis not present

## 2023-03-20 ENCOUNTER — Encounter: Payer: Self-pay | Admitting: Nurse Practitioner

## 2023-03-20 ENCOUNTER — Non-Acute Institutional Stay (INDEPENDENT_AMBULATORY_CARE_PROVIDER_SITE_OTHER): Payer: Medicare PPO | Admitting: Nurse Practitioner

## 2023-03-20 DIAGNOSIS — I10 Essential (primary) hypertension: Secondary | ICD-10-CM | POA: Diagnosis not present

## 2023-03-20 DIAGNOSIS — G9341 Metabolic encephalopathy: Secondary | ICD-10-CM | POA: Diagnosis not present

## 2023-03-20 DIAGNOSIS — Z0000 Encounter for general adult medical examination without abnormal findings: Secondary | ICD-10-CM

## 2023-03-20 DIAGNOSIS — R2689 Other abnormalities of gait and mobility: Secondary | ICD-10-CM | POA: Diagnosis not present

## 2023-03-20 DIAGNOSIS — R29898 Other symptoms and signs involving the musculoskeletal system: Secondary | ICD-10-CM | POA: Diagnosis not present

## 2023-03-20 DIAGNOSIS — N390 Urinary tract infection, site not specified: Secondary | ICD-10-CM | POA: Diagnosis not present

## 2023-03-20 NOTE — Progress Notes (Signed)
Subjective:   Eileen Wilkerson is a 89 y.o. female who presents for Medicare Annual (Subsequent) preventive examination @ [facility] [address].  Cardiac Risk Factors include: advanced age (>28men, >100 women);dyslipidemia;hypertension;obesity (BMI >30kg/m2);sedentary lifestyle     Objective:    Today's Vitals   03/20/23 1121 03/20/23 1217  BP: 138/80   Pulse: 80   Resp: 20   Temp: 98.2 F (36.8 C)   SpO2: 94%   Weight: 186 lb (84.4 kg)   Height: 5\' 2"  (1.575 m)   PainSc:  4    Body mass index is 34.02 kg/m.     03/20/2023   11:23 AM 03/13/2023   10:49 AM 03/06/2023   12:03 PM 03/02/2023    8:27 AM 02/19/2023    2:36 PM 02/12/2023    3:45 PM 02/12/2023   11:51 AM  Advanced Directives  Does Patient Have a Medical Advance Directive? Yes Yes Yes Yes Yes Yes No  Type of Estate agent of [place];Living will Healthcare Power of [place];Living will Healthcare Power of [place];Living will Healthcare Power of [place];Living will Healthcare Power of [place];Living will Living will;Healthcare Power of Attorney   Does patient want to make changes to medical advance directive? No - Patient declined No - Patient declined No - Patient declined No - Patient declined No - Patient declined No - Patient declined No - Patient declined  Copy of Healthcare Power of Attorney in Chart? Yes - validated most recent copy scanned in chart (See row information) Yes - validated most recent copy scanned in chart (See row information) Yes - validated most recent copy scanned in chart (See row information) Yes - validated most recent copy scanned in chart (See row information) Yes - validated most recent copy scanned in chart (See row information) No - copy requested     Current Medications (verified) Outpatient Encounter Medications as of 03/20/2023  Medication Sig   acetaminophen (TYLENOL) 325 MG tablet Take 650 mg by mouth every 4 (four) hours as needed.   amLODipine (NORVASC) 5 MG  tablet Take 5 mg by mouth daily.   atorvastatin (LIPITOR) 20 MG tablet TAKE 1 TABLET BY MOUTH EVERY DAY   Biotin 5 MG TBDP Take 1 tablet (5 mg total) by mouth daily.   Cholecalciferol (VITAMIN D3) 125 MCG (5000 UT) CAPS TAKE 1 CAPSULE BY MOUTH EVERY DAY   colchicine 0.6 MG tablet TAKE 1 TABLET BY MOUTH EVERY DAY   CRANBERRY PO Take 1 capsule by mouth daily.   cycloSPORINE (RESTASIS) 0.05 % ophthalmic emulsion Place 1 drop into both eyes 2 (two) times daily.   diclofenac Sodium (VOLTAREN) 1 % GEL APPLY 2 TO 4 GRAMS TOPICALLY TO AFFECTED JOINTS UP TO 4 TIMES DAILY   fexofenadine (ALLEGRA) 180 MG tablet Take 1 tablet (180 mg total) by mouth daily.   furosemide (LASIX) 80 MG tablet Take 1 tablet (80 mg total) by mouth daily.   ipratropium-albuterol (DUONEB) 0.5-2.5 (3) MG/3ML SOLN Take 3 mLs by nebulization as needed.   lansoprazole (PREVACID) 30 MG capsule TAKE 1 CAPSULE BY MOUTH ONCE DAILY AT NOON   montelukast (SINGULAIR) 10 MG tablet TAKE 1 TABLET BY MOUTH EVERYDAY AT BEDTIME   potassium chloride SA (KLOR-CON M) 20 MEQ tablet Take 20 mEq by mouth 2 (two) times daily.   predniSONE (DELTASONE) 5 MG tablet Take 5 mg by mouth daily with breakfast.   traMADol (ULTRAM) 50 MG tablet Take 1 tablet (50 mg total) by mouth every 6 (six) hours  as needed (pain).   [DISCONTINUED] amLODipine (NORVASC) 10 MG tablet Take 1 tablet (10 mg total) by mouth daily.   [DISCONTINUED] furosemide (LASIX) 40 MG tablet Take 40 mg by mouth daily. Give one tablet by mouth one time a day   [DISCONTINUED] potassium chloride (MICRO-K) 10 MEQ CR capsule Take 2 capsules (20 mEq total) by mouth 2 (two) times daily.   No facility-administered encounter medications on file as of 03/20/2023.    Allergies (verified) Adhesive [tape], Celebrex [celecoxib], Ciprofibrate, Cymbalta [duloxetine hcl], Gabitril [tiagabine], Lyrica [pregabalin], Neurontin [gabapentin], Nexium [esomeprazole], Nsaids, Shrimp [shellfish allergy], Azactam  [aztreonam], Azelastine hcl, Ciprofloxacin, Claritin [loratadine], Methotrexate derivatives, Nasacort [triamcinolone], Olopatadine, Other, Sulfamethizole, Zantac [ranitidine hcl], Claritin-d 12 hour [loratadine-pseudoephedrine er], Keflex [cephalexin], Penicillins, and Sulfa antibiotics   History: Past Medical History:  Diagnosis Date   Adrenal failure    Arthritis    Asthma    Cancer    Cataract    Closed nondisplaced fracture of fifth right metatarsal bone 09/18/2017   Diverticulitis    Per patient   E coli bacteremia    Fibromyalgia 2008   GERD (gastroesophageal reflux disease) 12/18/2016   HCAP (healthcare-associated pneumonia) 02/03/2018   Hyperlipidemia 10/13/2021   Osteoporosis    RA (rheumatoid arthritis)    Recurrent upper respiratory infection (URI)    Sepsis due to urinary tract infection 01/18/2018   Urticaria    Past Surgical History:  Procedure Laterality Date   BACK SURGERY     BILATERAL CARPAL TUNNEL RELEASE  2005   right and left   CATARACT EXTRACTION, BILATERAL  2004   right and left   CERVICAL FUSION  2011,2010,2008   2 disks   HEEL SPUR SURGERY  2004   lower back fusion  2011   Fusion of 3-4 and 4-5 lower back   RADIOFREQUENCY ABLATION  2020   ROTATOR CUFF REPAIR  [id number]   SQUAMOUS CELL CARCINOMA EXCISION     TONSILLECTOMY AND ADENOIDECTOMY  1947   TOTAL SHOULDER ARTHROPLASTY     Family History  Problem Relation Age of Onset   Heart attack Maternal Grandmother    Heart attack Paternal Grandfather    Breast cancer Mother 75   Diabetes Father    Heart disease Father    Congestive Heart Failure Father 3       Died from   Allergic rhinitis Neg Hx    Asthma Neg Hx    Eczema Neg Hx    Urticaria Neg Hx    Social History   Socioeconomic History   Marital status: Widowed    Spouse name: Not on file   Number of children: 2   Years of education: Not on file   Highest education level: Not on file  Occupational History   Occupation: Retired  Child psychotherapist  Tobacco Use   Smoking status: Former    Packs/day: 0.75    Years: 10.00    Additional pack years: 0.00    Total pack years: 7.50    Types: Cigarettes    Quit date: 1968    Years since quitting: 56.3    Passive exposure: Never   Smokeless tobacco: Never  Vaping Use   Vaping Use: Never used  Substance and Sexual Activity   Alcohol use: No   Drug use: No   Sexual activity: Not Currently  Other Topics Concern   Not on file  Social History Narrative      Diet:        Do you drink/ eat  things with caffeine? Dr. Reino KentPepper 2/ day      Marital status: Widowed                              What year were you married ? 1953      Do you live in a house, apartment,assistred living, condo, trailer, etc.)? Apartment      Is it one or more stories?       How many persons live in your home ? 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   No                           Type & how often       ADVANCED DIRECTIVES (Please bring copies)      Do you have a living will? Yes      Do you have a DNR form?                       If not, do you want to discuss one? Yes      Do you have signed POA?HPOA forms?                 If so, please bring to your appointment Tes      FUNCTIONAL STATUS- To be completed by Spouse / child / Staff       Do you have difficulty bathing or dressing yourself ? No      Do you have difficulty preparing food or eating ? No      Do you have difficulty managing your mediation ? No      Do you have difficulty managing your finances ? No      Do you have difficulty affording your medication ? No      Social Determinants of Health   Financial Resource Strain: Not on file  Food Insecurity: No Food Insecurity (02/12/2023)   Hunger Vital Sign    Worried About Running Out of Food in the Last  Year: Never true    Ran Out of Food in the Last Year: Never true  Transportation Needs: No Transportation Needs (02/12/2023)   PRAPARE - Administrator, Civil ServiceTransportation    Lack of Transportation (Medical): No    Lack of Transportation (Non-Medical): No  Physical Activity: Not on file  Stress: Not on file  Social Connections: Not on file    Tobacco Counseling Counseling given: Not Answered   Clinical Intake:     Pain : 0-10 Pain Score: 4  Pain Type: Chronic pain Pain Location: Hip Pain Orientation: Left Pain Descriptors / Indicators: Aching Pain Onset: More than a month ago Pain Frequency: Several days a week Pain Relieving Factors: rest, pain medication Effect of Pain on Daily Activities: less active  Pain Relieving Factors: rest, pain medication  BMI - recorded: 34.02 Nutritional Status: BMI > 30  Obese Nutritional Risks: None  How often do you need to have someone help you when you read instructions, pamphlets, or other written materials from your doctor or pharmacy?: 2 - Rarely What is the last grade level you completed in school?: college  Diabetic?no  Interpreter Needed?: No  Information entered by :: Koray Soter Nedra Hai NP   Activities of Daily Living    03/20/2023   12:21 PM 02/12/2023    3:45 PM  In your present state of health, do you have any difficulty performing the following activities:  Hearing? 0 1  Comment  patient wears hearing aids, but does not have them with her  Vision? 0 0  Comment  patient does wear glasses  Difficulty concentrating or making decisions? 0 0  Walking or climbing stairs? 1 1  Dressing or bathing? 1 1  Comment  yes due to patient's condition  Doing errands, shopping? 1 1  Preparing Food and eating ? N   Using the Toilet? N   In the past six months, have you accidently leaked urine? Y   Do you have problems with loss of bowel control? N   Managing your Medications? Y   Managing your Finances? N   Housekeeping or managing your Housekeeping? Y      Patient Care Team: Ladan Vanderzanden X, [name]  Indicate any recent Medical Services you may have received from other than [facility] providers in the past year (date may be approximate).     Assessment:   This is a routine wellness examination for Eileen Wilkerson.  Hearing/Vision screen No results found.  Dietary issues and exercise activities discussed: Current Exercise Habits: The patient does not participate in regular exercise at present, Exercise limited by: cardiac condition(s);neurologic condition(s);orthopedic condition(s)   Goals Addressed             This Visit's Progress    Maintain Mobility and Function       Evidence-based guidance:  Acknowledge and validate impact of pain, loss of strength and potential disfigurement (hand osteoarthritis) on mental health and daily life, such as social isolation, anxiety, depression, impaired sexual relationship and   injury from falls.  Anticipate referral to physical or occupational therapy for assessment, therapeutic exercise and recommendation for adaptive equipment or assistive devices; encourage participation.  Assess impact on ability to perform activities of daily living, as well as engage in sports and leisure events or requirements of work or school.  Provide anticipatory guidance and reassurance about the benefit of exercise to maintain function; acknowledge and normalize fear that exercise may worsen symptoms.  Encourage regular exercise, at least 10 minutes at a time for 45 minutes per week; consider yoga, water exercise and proprioceptive exercises; encourage use of wearable activity tracker to increase motivation and adherence.  Encourage maintenance or resumption of daily activities, including employment, as pain allows and with minimal exposure to trauma.  Assist patient to advocate for adaptations to the work environment.  Consider level of pain and function, gender, age, lifestyle, patient  preference, quality of life, readiness and ?ocapacity to benefit? when recommending patients for orthopaedic surgery consultation.  Explore strategies, such as changes to medication regimen or activity that enables patient to anticipate and manage flare-ups that increase deconditioning and disability.  Explore patient preferences; encourage exposure to a broader range of activities that have been avoided for fear of experiencing pain.  Identify barriers to participation in therapy or exercise, such as pain with activity, anticipated or imagined pain.  Monitor postoperative joint replacement or any preexisting joint replacement for ongoing pain and loss of function; provide social support and encouragement throughout recovery.   Notes:        Depression Screen    03/13/2023   10:49 AM 03/06/2023  11:57 AM 02/19/2023    2:28 PM 01/03/2023    3:53 PM 01/19/2022   10:54 AM 10/13/2021    3:31 PM 01/11/2021    1:10 PM  PHQ 2/9 Scores  PHQ - 2 Score 0 0 0 0 0 0 0    Fall Risk    03/13/2023   10:48 AM 03/06/2023   11:57 AM 02/19/2023    2:29 PM 01/31/2023    1:08 PM 01/25/2023    9:46 AM  Fall Risk   Falls in the past year? 0 0 1 0 0  Number falls in past yr: 0 0 1 0 0  Injury with Fall? 0 0 1 0 0  Risk for fall due to : History of fall(s);Impaired balance/gait History of fall(s);Impaired balance/gait History of fall(s);Impaired balance/gait Impaired balance/gait;Impaired mobility History of fall(s);Impaired balance/gait;Impaired mobility  Follow up Falls evaluation completed Falls evaluation completed Falls evaluation completed Falls evaluation completed Falls evaluation completed    FALL RISK PREVENTION PERTAINING TO THE HOME:  Any stairs in or around the home? Yes  If so, are there any without handrails? No  Home free of loose throw rugs in walkways, pet beds, electrical cords, etc? Yes  Adequate lighting in your home to reduce risk of falls? Yes   ASSISTIVE DEVICES UTILIZED TO PREVENT  FALLS:  Life alert? No  Use of a cane, walker or w/c? Yes  Grab bars in the bathroom? Yes  Shower chair or bench in shower? Yes  Elevated toilet seat or a handicapped toilet? Yes   TIMED UP AND GO:  Was the test performed? Yes .  Length of time to ambulate 10 feet: 15 sec.   Gait slow and steady with assistive device  Cognitive Function:        01/19/2022   10:57 AM 01/11/2021    1:13 PM 07/09/2018   11:34 AM  6CIT Screen  What Year? 0 points 0 points 0 points  What month? 0 points 0 points 0 points  What time? 0 points 0 points 0 points  Count back from 20 0 points 0 points 0 points  Months in reverse 0 points 0 points 0 points  Repeat phrase 0 points 0 points   Total Score 0 points 0 points     Immunizations Immunization History  Administered Date(s) Administered   Fluad Quad(high Dose 65+) 09/30/2020   Influenza, High Dose Seasonal PF 08/16/2018, 09/08/2019, 09/24/2020, 10/04/2021   Influenza,inj,Quad PF,6+ Mos 08/11/2016   Influenza-Unspecified 11/12/2017, 10/05/2022   Moderna Covid-19 Vaccine Bivalent Booster 4yrs & up 10/04/2021   Moderna Sars-Covid-2 Vaccination 12/15/2019, 01/12/2020, 10/19/2020   PNEUMOCOCCAL CONJUGATE-20 10/04/2021   PPD Test 03/01/2018   Pneumococcal Conjugate-13 06/26/2017   Pneumococcal Polysaccharide-23 12/11/2009   Zoster Recombinat (Shingrix) 01/28/2019, 09/02/2019    TDAP status: Due, Education has been provided regarding the importance of this vaccine. Advised may receive this vaccine at local pharmacy or Health Dept. Aware to provide a copy of the vaccination record if obtained from local pharmacy or Health Dept. Verbalized acceptance and understanding.  Flu Vaccine status: Up to date  Pneumococcal vaccine status: Up to date  Covid-19 vaccine status: Completed vaccines  Qualifies for Shingles Vaccine? Yes   Zostavax completed Yes   Shingrix Completed?: Yes  Screening Tests Health Maintenance  Topic Date Due   DTaP/Tdap/Td  (1 - Tdap) Never done   COVID-19 Vaccine (5 - 2023-24 season) 03/11/2024 (Originally 08/11/2022)   INFLUENZA VACCINE  07/12/2023   Medicare Annual Wellness (AWV)  03/19/2024   Pneumonia Vaccine 11+ Years old  Completed   DEXA SCAN  Completed   Zoster Vaccines- Shingrix  Completed   HPV VACCINES  Aged Out    Health Maintenance  Health Maintenance Due  Topic Date Due   DTaP/Tdap/Td (1 - Tdap) Never done    Colorectal cancer screening: No longer required.   Mammogram status: No longer required due to age.  Bone Density status: Completed 06/07/23. Results reflect: Bone density results: OSTEOPENIA. Repeat every 2 years.  Lung Cancer Screening: (Low Dose CT Chest recommended if Age 59-80 years, 30 pack-year currently smoking OR have quit w/in 15years.) does not qualify.     Additional Screening:  Hepatitis C Screening: does not qualify;   Vision Screening: Recommended annual ophthalmology exams for early detection of glaucoma and other disorders of the eye. Is the patient up to date with their annual eye exam?  No  Who is the provider or what is the name of the office in which the patient attends annual eye exams? HPOA/patient will provide If pt is not established with a provider, would they like to be referred to a provider to establish care? No .   Dental Screening: Recommended annual dental exams for proper oral hygiene  Community Resource Referral / Chronic Care Management: CRR required this visit?  No   CCM required this visit?  No      Plan:     I have personally reviewed and noted the following in the patient's chart:   Medical and social history Use of alcohol, tobacco or illicit drugs  Current medications and supplements including opioid prescriptions. Patient is not currently taking opioid prescriptions. Functional ability and status Nutritional status Physical activity Advanced directives List of other physicians Hospitalizations, surgeries, and ER visits  in previous 12 months Vitals Screenings to include cognitive, depression, and falls Referrals and appointments  In addition, I have reviewed and discussed with patient certain preventive protocols, quality metrics, and best practice recommendations. A written personalized care plan for preventive services as well as general preventive health recommendations were provided to patient.     Eileen Wilkerson X Daryl Beehler, NP   03/20/2023

## 2023-03-21 DIAGNOSIS — R2689 Other abnormalities of gait and mobility: Secondary | ICD-10-CM | POA: Diagnosis not present

## 2023-03-21 DIAGNOSIS — R29898 Other symptoms and signs involving the musculoskeletal system: Secondary | ICD-10-CM | POA: Diagnosis not present

## 2023-03-21 DIAGNOSIS — G9341 Metabolic encephalopathy: Secondary | ICD-10-CM | POA: Diagnosis not present

## 2023-03-22 ENCOUNTER — Non-Acute Institutional Stay: Payer: Medicare PPO | Admitting: Nurse Practitioner

## 2023-03-22 DIAGNOSIS — I5032 Chronic diastolic (congestive) heart failure: Secondary | ICD-10-CM

## 2023-03-22 DIAGNOSIS — M810 Age-related osteoporosis without current pathological fracture: Secondary | ICD-10-CM

## 2023-03-22 DIAGNOSIS — M5116 Intervertebral disc disorders with radiculopathy, lumbar region: Secondary | ICD-10-CM

## 2023-03-22 DIAGNOSIS — Z86718 Personal history of other venous thrombosis and embolism: Secondary | ICD-10-CM | POA: Diagnosis not present

## 2023-03-22 DIAGNOSIS — K5720 Diverticulitis of large intestine with perforation and abscess without bleeding: Secondary | ICD-10-CM

## 2023-03-22 DIAGNOSIS — R2689 Other abnormalities of gait and mobility: Secondary | ICD-10-CM | POA: Diagnosis not present

## 2023-03-22 DIAGNOSIS — E876 Hypokalemia: Secondary | ICD-10-CM | POA: Diagnosis not present

## 2023-03-22 DIAGNOSIS — R600 Localized edema: Secondary | ICD-10-CM

## 2023-03-22 DIAGNOSIS — M109 Gout, unspecified: Secondary | ICD-10-CM

## 2023-03-22 DIAGNOSIS — G9341 Metabolic encephalopathy: Secondary | ICD-10-CM | POA: Diagnosis not present

## 2023-03-22 DIAGNOSIS — M0579 Rheumatoid arthritis with rheumatoid factor of multiple sites without organ or systems involvement: Secondary | ICD-10-CM

## 2023-03-22 DIAGNOSIS — E099 Drug or chemical induced diabetes mellitus without complications: Secondary | ICD-10-CM | POA: Diagnosis not present

## 2023-03-22 DIAGNOSIS — T380X5D Adverse effect of glucocorticoids and synthetic analogues, subsequent encounter: Secondary | ICD-10-CM

## 2023-03-22 DIAGNOSIS — R29898 Other symptoms and signs involving the musculoskeletal system: Secondary | ICD-10-CM | POA: Diagnosis not present

## 2023-03-22 DIAGNOSIS — M797 Fibromyalgia: Secondary | ICD-10-CM

## 2023-03-22 DIAGNOSIS — J45909 Unspecified asthma, uncomplicated: Secondary | ICD-10-CM

## 2023-03-22 NOTE — Assessment & Plan Note (Signed)
R ankle, stable, off  Colchicine

## 2023-03-22 NOTE — Assessment & Plan Note (Signed)
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 Diclofenac, MRI completed, Neurosurgery consulted, not a good surgical candidate.

## 2023-03-22 NOTE — Assessment & Plan Note (Signed)
03/20/23 Na 141, K 3.4, Bun 18, creat 1.08, dc Furosemide, start Torsemide 40mg  bid, Kcl bid, BMP one week.

## 2023-03-22 NOTE — Assessment & Plan Note (Signed)
RA/fibromyalgia, taking steroids, and Tramadol, TSH 0.865 02/27/22 

## 2023-03-22 NOTE — Assessment & Plan Note (Signed)
Hgb a1c 6.7, prediabetic, on Steroids, monitor CBG

## 2023-03-22 NOTE — Progress Notes (Signed)
Location:  [facility] Room Number: [id number] Place of Service:  ALF [phone]) Provider:  Donalee Gaumond X, NP  Patient Care Team: Layni Kreamer X, [name]  Extended Emergency Contact Information Primary Emergency Contact: [name] [place] of [place] Home Phone: [phone] Mobile Phone: [phone] Relation: Daughter  Code Status:  Full Code Goals of care: Advanced Directive information    03/22/2023   10:53 AM  Advanced Directives  Does Patient Have a Medical Advance Directive? Yes  Type of Estate agent of [place];Living will  Does patient want to make changes to medical advance directive? No - Patient declined  Copy of Healthcare Power of Attorney in Chart? Yes - validated most recent copy scanned in chart (See row information)     Chief Complaint  Patient presents with   Acute Visit    Swelling legs.    HPI:  Pt is a 89 y.o. female seen today for an acute visit for reported bilateral lower extremities are red and warmth to touch, denied increased pain, pain in calves or with R+L dorsiflexion.     UTI, self stopped Cipro, c/o nausea, asymptomatic, will repeat UA C/S was negative for UTI in setting of Hx of uroseptic event.               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 Diclofenac, MRI completed, Neurosurgery consulted, not a good surgical candidate.                           Hgb a1c 6.7, prediabetic, on Steroids, monitor CBG             The right middle finger stiffness, difficulty eating.              Chronic diastolic heart failure, DOE, on and off wheezing, cough, occasional yellow phlegm in am. CXR no evidence of acute infection 10/2022. EF 60-65% 05/22/22, on Torsemide, Amlodipine             Hypokalemia, K 3.4 03/20/23             Hx of sinus tachycardia, heart rate is in control.              Hx of aute  sigmoid diverticulitis with pneumoperitoneum, f/u Dr. Sheliah Hatch, diarrhea on and off, negative C-diff while in hospital.              Asthma Epinephrine prn, Allegra, Flonase, Singulair             RA/fibromyalgia, taking steroids, and Tramadol, TSH 0.865 02/27/22             GERD off acid reducer.              Venous insufficiency/Edema , takes Torsemide, Kcl.             OP Rheumatology, off Alendronate, on Vit D, t score -1.8 06/06/21             Hyperlipidemia, takes Atorvastatin             Vit B12 deficiency, on Vit B12 po, Vit B12 235 06/20/21             CT left adnexa cyst 11/2020, MRI 05/08/21,  05/22/22 renal US shoed No evidence of renal mass or hydronephrosis.  Gout, R ankle, stable, off  Colchicine             Hx of DVT, venous US BLE was negative for DVT while in hospital. Off Eliquis.   Past Medical History:  Diagnosis Date   Adrenal failure    Arthritis    Asthma    Cancer    Cataract    Closed nondisplaced fracture of fifth right metatarsal bone 09/18/2017   Diverticulitis    Per patient   E coli bacteremia    Fibromyalgia 2008   GERD (gastroesophageal reflux disease) 12/18/2016   HCAP (healthcare-associated pneumonia) 02/03/2018   Hyperlipidemia 10/13/2021   Osteoporosis    RA (rheumatoid arthritis)    Recurrent upper respiratory infection (URI)    Sepsis due to urinary tract infection 01/18/2018   Urticaria    Past Surgical History:  Procedure Laterality Date   BACK SURGERY     BILATERAL CARPAL TUNNEL RELEASE  2005   right and left   CATARACT EXTRACTION, BILATERAL  2004   right and left   CERVICAL FUSION  2011,2010,2008   2 disks   HEEL SPUR SURGERY  2004   lower back fusion  2011   Fusion of 3-4 and 4-5 lower back   RADIOFREQUENCY ABLATION  2020   ROTATOR CUFF REPAIR  [id number]   SQUAMOUS CELL CARCINOMA EXCISION     TONSILLECTOMY AND ADENOIDECTOMY  1947   TOTAL SHOULDER ARTHROPLASTY      Allergies  Allergen Reactions   Adhesive [Tape]  Rash   Celebrex [Celecoxib] Hives   Ciprofibrate Nausea Only   Cymbalta [Duloxetine Hcl] Swelling   Gabitril [Tiagabine] Swelling   Lyrica [Pregabalin] Swelling   Neurontin [Gabapentin] Swelling   Nexium [Esomeprazole] Rash   Nsaids Rash   Shrimp [Shellfish Allergy] Anaphylaxis    Per patient "shrimp only"   Azactam [Aztreonam]     Hand swelling    Azelastine Hcl     Rash    Ciprofloxacin Other (See Comments)    dizziness   Claritin [Loratadine]     Irritability Nervousness    Methotrexate Derivatives    Nasacort [Triamcinolone]     Dizzy    Olopatadine Other (See Comments)    Pain and lethargy    Other    Sulfamethizole Other (See Comments)    unknown   Zantac [Ranitidine Hcl] Other (See Comments)    unknown   Claritin-D 12 Hour [Loratadine-Pseudoephedrine Er] Anxiety   Keflex [Cephalexin] Nausea And Vomiting    Tolerated Ancef   Penicillins Rash    Injection site reaction. Tolerated cefepime in past. Also reports tolerating a penicillin infusion after this initial rxn ~20 yrs ago.  Has patient had a PCN reaction causing immediate rash, facial/tongue/throat swelling, SOB or lightheadedness with hypotension: No Has patient had a PCN reaction causing severe rash involving mucus membranes or skin necrosis: No Has patient had a PCN reaction that required hospitalization No Has patient had a PCN reaction occurring within the last 10 years: No     Sulfa Antibiotics Nausea And Vomiting    Outpatient Encounter Medications as of 03/22/2023  Medication Sig   acetaminophen (TYLENOL) 325 MG tablet Take 650 mg by mouth every 4 (four) hours as needed.   amLODipine (NORVASC) 5 MG tablet Take 5 mg by mouth daily.   atorvastatin (LIPITOR) 20 MG tablet TAKE 1 TABLET BY MOUTH EVERY DAY   Biotin 5 MG TBDP Take 1 tablet (5 mg total) by mouth daily.   Cholecalciferol (VITAMIN D3)  125 MCG (5000 UT) CAPS TAKE 1 CAPSULE BY MOUTH EVERY DAY   colchicine 0.6 MG tablet TAKE 1 TABLET BY MOUTH  EVERY DAY   CRANBERRY PO Take 1 capsule by mouth daily.   cycloSPORINE (RESTASIS) 0.05 % ophthalmic emulsion Place 1 drop into both eyes 2 (two) times daily.   diclofenac Sodium (VOLTAREN) 1 % GEL APPLY 2 TO 4 GRAMS TOPICALLY TO AFFECTED JOINTS UP TO 4 TIMES DAILY   fexofenadine (ALLEGRA) 180 MG tablet Take 1 tablet (180 mg total) by mouth daily.   furosemide (LASIX) 80 MG tablet Take 1 tablet (80 mg total) by mouth daily.   ipratropium-albuterol (DUONEB) 0.5-2.5 (3) MG/3ML SOLN Take 3 mLs by nebulization as needed.   lansoprazole (PREVACID) 30 MG capsule TAKE 1 CAPSULE BY MOUTH ONCE DAILY AT NOON   montelukast (SINGULAIR) 10 MG tablet TAKE 1 TABLET BY MOUTH EVERYDAY AT BEDTIME   potassium chloride SA (KLOR-CON M) 20 MEQ tablet Take 20 mEq by mouth 2 (two) times daily.   predniSONE (DELTASONE) 5 MG tablet Take 5 mg by mouth daily with breakfast.   traMADol (ULTRAM) 50 MG tablet Take 1 tablet (50 mg total) by mouth every 6 (six) hours as needed (pain).   No facility-administered encounter medications on file as of 03/22/2023.    Review of Systems  Constitutional:  Negative for appetite change, fatigue and fever.  HENT:  Negative for congestion, sinus pressure and trouble swallowing.   Eyes:  Negative for visual disturbance.  Respiratory:  Positive for shortness of breath. Negative for cough, chest tightness and wheezing.        DOE, occasional yellow phlegm in am, hacking cough.   Cardiovascular:  Positive for leg swelling.  Gastrointestinal:  Negative for abdominal pain, constipation, nausea and vomiting.       On and off diarrhea.   Genitourinary:  Negative for dysuria, frequency and urgency.       Incontinent of urine.   Musculoskeletal:  Positive for arthralgias, back pain, gait problem and myalgias.       Right lower back hip pain, travels down to the right leg. Left groin/hip region pain with movement, able to walk if Tramadol use.   Skin:  Negative for color change.  Neurological:   Negative for speech difficulty, weakness and headaches.  Psychiatric/Behavioral:  Negative for confusion and sleep disturbance. The patient is not nervous/anxious.     Immunization History  Administered Date(s) Administered   Fluad Quad(high Dose 65+) 09/30/2020   Influenza, High Dose Seasonal PF 08/16/2018, 09/08/2019, 09/24/2020, 10/04/2021   Influenza,inj,Quad PF,6+ Mos 08/11/2016   Influenza-Unspecified 11/12/2017, 10/05/2022   Moderna Covid-19 Vaccine Bivalent Booster 847yrs & up 10/04/2021   Moderna Sars-Covid-2 Vaccination 12/15/2019, 01/12/2020, 10/19/2020   PNEUMOCOCCAL CONJUGATE-20 10/04/2021   PPD Test 03/01/2018   Pneumococcal Conjugate-13 06/26/2017   Pneumococcal Polysaccharide-23 12/11/2009   Zoster Recombinat (Shingrix) 01/28/2019, 09/02/2019   Pertinent  Health Maintenance Due  Topic Date Due   INFLUENZA VACCINE  07/12/2023   DEXA SCAN  Completed      01/31/2023    1:08 PM 02/19/2023    2:29 PM 03/06/2023   11:57 AM 03/13/2023   10:48 AM 03/22/2023   10:53 AM  Fall Risk  Falls in the past year? 0 1 0 0 0  Was there an injury with Fall? 0 1 0 0 0  Fall Risk Category Calculator 0 3 0 0 0  Patient at Risk for Falls Due to Impaired balance/gait;Impaired mobility History of fall(s);Impaired balance/gait  History of fall(s);Impaired balance/gait History of fall(s);Impaired balance/gait History of fall(s);Impaired balance/gait  Fall risk Follow up Falls evaluation completed Falls evaluation completed Falls evaluation completed Falls evaluation completed Falls evaluation completed   Functional Status Survey:    Vitals:   03/22/23 1048  BP: (!) 146/70  Pulse: 88  Resp: 18  Temp: 99 F (37.2 C)  SpO2: 96%  Weight: 186 lb 3.2 oz (84.5 kg)  Height: 5\' 2"  (1.575 m)   Body mass index is 34.06 kg/m. Physical Exam Vitals and nursing note reviewed.  Constitutional:      Appearance: Normal appearance.  HENT:     Head: Normocephalic and atraumatic.     Nose: Nose  normal.     Mouth/Throat:     Mouth: Mucous membranes are moist.  Eyes:     Extraocular Movements: Extraocular movements intact.     Conjunctiva/sclera: Conjunctivae normal.     Pupils: Pupils are equal, round, and reactive to light.  Cardiovascular:     Rate and Rhythm: Normal rate and regular rhythm.     Heart sounds: No murmur heard. Pulmonary:     Effort: Pulmonary effort is normal.     Breath sounds: No rales.  Abdominal:     General: Bowel sounds are normal.     Palpations: Abdomen is soft.     Tenderness: There is no abdominal tenderness.  Musculoskeletal:        General: No tenderness.     Cervical back: Normal range of motion and neck supple.     Right lower leg: Edema present.     Left lower leg: Edema present.     Comments: 2+ edema BLE, L>R  Skin:    General: Skin is warm and dry.     Comments: Lots of moles. Chronic erythema venous insufficiency skin changes BLE  Neurological:     General: No focal deficit present.     Mental Status: She is alert and oriented to person, place, and time. Mental status is at baseline.     Gait: Gait abnormal.  Psychiatric:        Mood and Affect: Mood normal.        Behavior: Behavior normal.        Thought Content: Thought content normal.        Judgment: Judgment normal.     Labs reviewed: Recent Labs    05/28/22 0551 05/29/22 0419 05/30/22 0514 10/27/22 0945 02/12/23 1120 02/13/23 0435 02/14/23 0420 02/20/23 0000  NA 140 140 143   < > 139 140 141 142  K 3.3* 4.1 3.9   < > 4.1 3.4* 3.2* 4.0  CL 99 102 104   < > 99 104 110 106  CO2 30 29 30    < > 27 25 23  26*  GLUCOSE 114* 178* 157*   < > 125* 104* 174*  --   BUN 9 20 24*   < > 17 14 20  32*  CREATININE 0.83 0.85 0.87   < > 1.09* 0.88 0.85 1.0  CALCIUM 9.5 9.5 9.1   < > 9.4 9.2 9.1 9.1  MG 2.0 2.3 2.2  --   --   --   --   --    < > = values in this interval not displayed.   Recent Labs    10/27/22 0945 10/28/22 0357 02/12/23 1120 02/20/23 0000  AST 25 19 26   12*  ALT 17 14 15 16   ALKPHOS 61 47 61 53  BILITOT 1.4*  1.1 1.4*  --   PROT 7.6 6.3* 7.9  --   ALBUMIN 3.7 2.7* 3.8 3.5   Recent Labs    10/28/22 0357 10/29/22 0343 10/30/22 1216 02/12/23 1120 02/13/23 0435 02/14/23 0420 02/20/23 0000  WBC 17.1* 13.6*   < > 15.7* 12.4* 9.1 12.0  NEUTROABS 14.6* 10.1*  --  10.8*  --   --   --   HGB 10.9* 9.8*   < > 13.7 11.4* 10.5* 12.5  HCT 35.8* 32.8*   < > 44.7 38.5 34.8* 38  MCV 98.6 99.7   < > 96.5 97.2 95.1  --   PLT 240 221   < > 278 250 251 345   < > = values in this interval not displayed.   Lab Results  Component Value Date   TSH 0.865 02/27/2022   Lab Results  Component Value Date   HGBA1C 6.7 (H) 02/15/2023   Lab Results  Component Value Date   CHOL 176 04/06/2020   HDL 67 04/06/2020   LDLCALC 82 04/06/2020   TRIG 171 (H) 04/06/2020   CHOLHDL 2.6 04/06/2020    Significant Diagnostic Results in last 30 days:  No results found.  Assessment/Plan Bilateral lower extremity edema 03/20/23 Na 141, K 3.4, Bun 18, creat 1.08, dc Furosemide, start Torsemide  bid, Kcl bid, BMP one week.      Family/ staff Communication: plan of care reviewed with the patient and charge nurse.   Labs/tests ordered:  BMP one week  Time spend 35 minutes.

## 2023-03-22 NOTE — Assessment & Plan Note (Signed)
Hx of DVT, venous US BLE was negative for DVT while in hospital. Off Eliquis.  

## 2023-03-22 NOTE — Assessment & Plan Note (Signed)
K 3.4 03/20/23

## 2023-03-22 NOTE — Assessment & Plan Note (Signed)
OP Rheumatology, off Alendronate, on Vit D, t score -1.8 06/06/21 

## 2023-03-22 NOTE — Assessment & Plan Note (Signed)
Chronic diastolic heart failure, DOE, on and off wheezing, cough, occasional yellow phlegm in am. CXR no evidence of acute infection 10/2022. EF 60-65% 05/22/22, on Torsemide, Amlodipine

## 2023-03-23 DIAGNOSIS — R2689 Other abnormalities of gait and mobility: Secondary | ICD-10-CM | POA: Diagnosis not present

## 2023-03-23 DIAGNOSIS — R29898 Other symptoms and signs involving the musculoskeletal system: Secondary | ICD-10-CM | POA: Diagnosis not present

## 2023-03-23 DIAGNOSIS — G9341 Metabolic encephalopathy: Secondary | ICD-10-CM | POA: Diagnosis not present

## 2023-03-25 ENCOUNTER — Other Ambulatory Visit: Payer: Self-pay | Admitting: Nurse Practitioner

## 2023-03-25 DIAGNOSIS — E7800 Pure hypercholesterolemia, unspecified: Secondary | ICD-10-CM

## 2023-03-26 ENCOUNTER — Encounter: Payer: Self-pay | Admitting: Nurse Practitioner

## 2023-03-26 DIAGNOSIS — G9341 Metabolic encephalopathy: Secondary | ICD-10-CM | POA: Diagnosis not present

## 2023-03-26 DIAGNOSIS — R2689 Other abnormalities of gait and mobility: Secondary | ICD-10-CM | POA: Diagnosis not present

## 2023-03-26 DIAGNOSIS — R29898 Other symptoms and signs involving the musculoskeletal system: Secondary | ICD-10-CM | POA: Diagnosis not present

## 2023-03-26 NOTE — Progress Notes (Signed)
This encounter was created in error - please disregard.

## 2023-03-26 NOTE — Progress Notes (Addendum)
Location:  [facility] Room Number: [phone] Place of Service:  ALF [phone]) Provider: Dashanique Brownstein X, NP  Patient Care Team: Brennen Gardiner X, [name]  Extended Emergency Contact Information Primary Emergency Contact: [name] [place] of [place] Home Phone: [phone] Mobile Phone: [phone] Relation: Daughter  Code Status:  Full Code Goals of care: Advanced Directive information    03/26/2023    4:06 PM  Advanced Directives  Does Patient Have a Medical Advance Directive? Yes  Type of Estate agent of [place];Living will  Does patient want to make changes to medical advance directive? No - Patient declined  Copy of Healthcare Power of Attorney in Chart? Yes - validated most recent copy scanned in chart (See row information)     Chief Complaint  Patient presents with   Acute Visit    Swollen Legs    HPI:  Pt is a 89 y.o. female seen today for an acute visit for swollen legs, no open wounds, pain in calves, chronic venous insufficiency skin changes BLE   Hx of DVT, venous US BLE was negative for DVT while in hospital. Off Eliquis.     Chronic diastolic heart failure, DOE, on and off wheezing, cough, occasional yellow phlegm in am. CXR no evidence of acute infection 10/2022. 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 Diclofenac, MRI completed, Neurosurgery consulted, not a good surgical candidate.                           Hgb a1c 6.7, prediabetic, on Steroids, monitor CBG             The right middle finger stiffness, difficulty eating.  Hypokalemia, K 3.4 03/20/23, increased Kcl, f/u BMP              Hx of sinus tachycardia, heart rate is in control.              Hx of aute sigmoid diverticulitis with pneumoperitoneum, f/u Dr. Sheliah Hatch, diarrhea on and off, negative  C-diff while in hospital.              Asthma Epinephrine prn, Allegra, Flonase, Singulair             RA/fibromyalgia, taking steroids, and Tramadol, TSH 0.865 02/27/22             GERD off acid reducer.              Venous insufficiency/Edema , takes Torsemide, Kcl.             OP Rheumatology, off Alendronate, on Vit D, t score -1.8 06/06/21             Hyperlipidemia, takes Atorvastatin             Vit B12 deficiency, on Vit B12 po, Vit B12 235 06/20/21             CT left adnexa cyst 11/2020, MRI 05/08/21,  05/22/22 renal US shoed No evidence of renal mass or hydronephrosis.             Gout, R ankle, stable, off  Colchicine             Hx of DVT, venous US BLE was negative for DVT while in hospital. Off Eliquis.      Past Medical  History:  Diagnosis Date   Adrenal failure    Arthritis    Asthma    Cancer    Cataract    Closed nondisplaced fracture of fifth right metatarsal bone 09/18/2017   Diverticulitis    Per patient   E coli bacteremia    Fibromyalgia 2008   GERD (gastroesophageal reflux disease) 12/18/2016   HCAP (healthcare-associated pneumonia) 02/03/2018   Hyperlipidemia 10/13/2021   Osteoporosis    RA (rheumatoid arthritis)    Recurrent upper respiratory infection (URI)    Sepsis due to urinary tract infection 01/18/2018   Urticaria    Past Surgical History:  Procedure Laterality Date   BACK SURGERY     BILATERAL CARPAL TUNNEL RELEASE  2005   right and left   CATARACT EXTRACTION, BILATERAL  2004   right and left   CERVICAL FUSION  2011,2010,2008   2 disks   HEEL SPUR SURGERY  2004   lower back fusion  2011   Fusion of 3-4 and 4-5 lower back   RADIOFREQUENCY ABLATION  2020   ROTATOR CUFF REPAIR  [id number]   SQUAMOUS CELL CARCINOMA EXCISION     TONSILLECTOMY AND ADENOIDECTOMY  1947   TOTAL SHOULDER ARTHROPLASTY      Allergies  Allergen Reactions   Adhesive [Tape] Rash   Celebrex [Celecoxib] Hives   Ciprofibrate Nausea Only   Cymbalta [Duloxetine  Hcl] Swelling   Gabitril [Tiagabine] Swelling   Lyrica [Pregabalin] Swelling   Neurontin [Gabapentin] Swelling   Nexium [Esomeprazole] Rash   Nsaids Rash   Shrimp [Shellfish Allergy] Anaphylaxis    Per patient "shrimp only"   Azactam [Aztreonam]     Hand swelling    Azelastine Hcl     Rash    Ciprofloxacin Other (See Comments)    dizziness   Claritin [Loratadine]     Irritability Nervousness    Methotrexate Derivatives    Nasacort [Triamcinolone]     Dizzy    Olopatadine Other (See Comments)    Pain and lethargy    Other    Sulfamethizole Other (See Comments)    unknown   Zantac [Ranitidine Hcl] Other (See Comments)    unknown   Claritin-D 12 Hour [Loratadine-Pseudoephedrine Er] Anxiety   Keflex [Cephalexin] Nausea And Vomiting    Tolerated Ancef   Penicillins Rash    Injection site reaction. Tolerated cefepime in past. Also reports tolerating a penicillin infusion after this initial rxn ~20 yrs ago.  Has patient had a PCN reaction causing immediate rash, facial/tongue/throat swelling, SOB or lightheadedness with hypotension: No Has patient had a PCN reaction causing severe rash involving mucus membranes or skin necrosis: No Has patient had a PCN reaction that required hospitalization No Has patient had a PCN reaction occurring within the last 10 years: No     Sulfa Antibiotics Nausea And Vomiting    Outpatient Encounter Medications as of 03/22/2023  Medication Sig   acetaminophen (TYLENOL) 325 MG tablet Take 650 mg by mouth every 4 (four) hours as needed.   amLODipine (NORVASC) 5 MG tablet Take 5 mg by mouth daily.   atorvastatin (LIPITOR) 20 MG tablet TAKE 1 TABLET BY MOUTH EVERY DAY   Biotin 5 MG TBDP Take 1 tablet (5 mg total) by mouth daily.   Cholecalciferol (VITAMIN D3) 125 MCG (5000 UT) CAPS TAKE 1 CAPSULE BY MOUTH EVERY DAY   colchicine 0.6 MG tablet TAKE 1 TABLET BY MOUTH EVERY DAY   CRANBERRY PO Take 1 capsule by mouth daily.   cycloSPORINE (  RESTASIS) 0.05 %  ophthalmic emulsion Place 1 drop into both eyes 2 (two) times daily.   diclofenac Sodium (VOLTAREN) 1 % GEL APPLY 2 TO 4 GRAMS TOPICALLY TO AFFECTED JOINTS UP TO 4 TIMES DAILY   fexofenadine (ALLEGRA) 180 MG tablet Take 1 tablet (180 mg total) by mouth daily.   furosemide (LASIX) 80 MG tablet Take 1 tablet (80 mg total) by mouth daily.   ipratropium-albuterol (DUONEB) 0.5-2.5 (3) MG/3ML SOLN Take 3 mLs by nebulization as needed.   lansoprazole (PREVACID) 30 MG capsule TAKE 1 CAPSULE BY MOUTH ONCE DAILY AT NOON   montelukast (SINGULAIR) 10 MG tablet TAKE 1 TABLET BY MOUTH EVERYDAY AT BEDTIME   potassium chloride SA (KLOR-CON M) 20 MEQ tablet Take 20 mEq by mouth 2 (two) times daily.   predniSONE (DELTASONE) 5 MG tablet Take 5 mg by mouth daily with breakfast.   traMADol (ULTRAM) 50 MG tablet Take 1 tablet (50 mg total) by mouth every 6 (six) hours as needed (pain).   [DISCONTINUED] atorvastatin (LIPITOR) 20 MG tablet TAKE 1 TABLET BY MOUTH EVERY DAY   No facility-administered encounter medications on file as of 03/22/2023.    Review of Systems  Constitutional:  Negative for appetite change, fatigue and fever.  HENT:  Negative for congestion, sinus pressure and trouble swallowing.   Eyes:  Negative for visual disturbance.  Respiratory:  Positive for shortness of breath. Negative for cough, chest tightness and wheezing.        DOE, occasional yellow phlegm in am, hacking cough.   Cardiovascular:  Positive for leg swelling.  Gastrointestinal:  Negative for abdominal pain, constipation, nausea and vomiting.       On and off diarrhea.   Genitourinary:  Negative for dysuria, frequency and urgency.       Incontinent of urine.   Musculoskeletal:  Positive for arthralgias, back pain, gait problem and myalgias.       Right lower back hip pain, travels down to the right leg. Left groin/hip region pain with movement, able to walk if Tramadol use.   Skin:  Negative for color change.  Neurological:   Negative for speech difficulty, weakness and headaches.  Psychiatric/Behavioral:  Negative for confusion and sleep disturbance. The patient is not nervous/anxious.     Immunization History  Administered Date(s) Administered   Fluad Quad(high Dose 65+) 09/30/2020   Influenza, High Dose Seasonal PF 08/16/2018, 09/08/2019, 09/24/2020, 10/04/2021   Influenza,inj,Quad PF,6+ Mos 08/11/2016   Influenza-Unspecified 11/12/2017, 10/05/2022   Moderna Covid-19 Vaccine Bivalent Booster 90yrs & up 10/04/2021   Moderna Sars-Covid-2 Vaccination 12/15/2019, 01/12/2020, 10/19/2020   PNEUMOCOCCAL CONJUGATE-20 10/04/2021   PPD Test 03/01/2018   Pneumococcal Conjugate-13 06/26/2017   Pneumococcal Polysaccharide-23 12/11/2009   Zoster Recombinat (Shingrix) 01/28/2019, 09/02/2019   Pertinent  Health Maintenance Due  Topic Date Due   INFLUENZA VACCINE  07/12/2023   DEXA SCAN  Completed      01/31/2023    1:08 PM 02/19/2023    2:29 PM 03/06/2023   11:57 AM 03/13/2023   10:48 AM 03/22/2023   10:53 AM  Fall Risk  Falls in the past year? 0 1 0 0 0  Was there an injury with Fall? 0 1 0 0 0  Fall Risk Category Calculator 0 3 0 0 0  Patient at Risk for Falls Due to Impaired balance/gait;Impaired mobility History of fall(s);Impaired balance/gait History of fall(s);Impaired balance/gait History of fall(s);Impaired balance/gait History of fall(s);Impaired balance/gait  Fall risk Follow up Falls evaluation completed Falls evaluation completed  Falls evaluation completed Falls evaluation completed Falls evaluation completed   Functional Status Survey:    Vitals:   03/22/23 1604 03/26/23 1020  BP: (!) 146/70 128/70  Pulse: 88   Resp: 18   Temp: 99 F (37.2 C)   SpO2: 96%   Weight: 186 lb 3.2 oz (84.5 kg)   Height:  (1.575 m)    Body mass index is 34.06 kg/m. Physical Exam Vitals and nursing note reviewed.  Constitutional:      Appearance: Normal appearance.  HENT:     Head: Normocephalic and  atraumatic.     Nose: Nose normal.     Mouth/Throat:     Mouth: Mucous membranes are moist.  Eyes:     Extraocular Movements: Extraocular movements intact.     Conjunctiva/sclera: Conjunctivae normal.     Pupils: Pupils are equal, round, and reactive to light.  Cardiovascular:     Rate and Rhythm: Normal rate and regular rhythm.     Heart sounds: No murmur heard. Pulmonary:     Effort: Pulmonary effort is normal.     Breath sounds: No rales.  Abdominal:     General: Bowel sounds are normal.     Palpations: Abdomen is soft.     Tenderness: There is no abdominal tenderness.  Musculoskeletal:        General: No tenderness.     Cervical back: Normal range of motion and neck supple.     Right lower leg: Edema present.     Left lower leg: Edema present.     Comments: 1+ edema BLE, L>R  Skin:    General: Skin is warm and dry.     Comments: Lots of moles. Chronic erythema venous insufficiency skin changes BLE  Neurological:     General: No focal deficit present.     Mental Status: She is alert and oriented to person, place, and time. Mental status is at baseline.     Gait: Gait abnormal.  Psychiatric:        Mood and Affect: Mood normal.        Behavior: Behavior normal.        Thought Content: Thought content normal.        Judgment: Judgment normal.     Labs reviewed: Recent Labs    05/28/22 0551 05/29/22 0419 05/30/22 0514 10/27/22 0945 02/12/23 1120 02/13/23 0435 02/14/23 0420 02/20/23 0000  NA 140 140 143   < > 139 140 141 142  K 3.3* 4.1 3.9   < > 4.1 3.4* 3.2* 4.0  CL 99 102 104   < > 99 104 110 106  CO2 < > 26*  GLUCOSE 114* 178* 157*   < > 125* 104* 174*  --   BUN 9 20 24*   < > 32*  CREATININE 0.83 0.85 0.87   < > 1.09* 0.88 0.85 1.0  CALCIUM 9.5 9.5 9.1   < > 9.4 9.2 9.1 9.1  MG 2.0 2.3 2.2  --   --   --   --   --    < > = values in this interval not displayed.   Recent Labs    10/27/22 0945 10/28/22 0357 02/12/23 1120  02/20/23 0000  AST 12*  ALT ALKPHOS 61 47 61 53  BILITOT 1.4* 1.1 1.4*  --   PROT 7.6 6.3* 7.9  --  ALBUMIN 3.7 2.7* 3.8 3.5   Recent Labs    10/28/22 0357 10/29/22 0343 10/30/22 1216 02/12/23 1120 02/13/23 0435 02/14/23 0420 02/20/23 0000  WBC 17.1* 13.6*   < > 15.7* 12.4* 9.1 12.0  NEUTROABS 14.6* 10.1*  --  10.8*  --   --   --   HGB 10.9* 9.8*   < > 13.7 11.4* 10.5* 12.5  HCT 35.8* 32.8*   < > 44.7 38.5 34.8* 38  MCV 98.6 99.7   < > 96.5 97.2 95.1  --   PLT 240 221   < > 278 250 251 345   < > = values in this interval not displayed.   Lab Results  Component Value Date   TSH 0.865 02/27/2022   Lab Results  Component Value Date   HGBA1C 6.7 (H) 02/15/2023   Lab Results  Component Value Date   CHOL 176 04/06/2020   HDL 67 04/06/2020   LDLCALC 82 04/06/2020   TRIG 171 (H) 04/06/2020   CHOLHDL 2.6 04/06/2020    Significant Diagnostic Results in last 30 days:  No results found.  Assessment/Plan Chronic diastolic heart failure Improved swollen legs since switched to Torsemide, no open wounds, pain in calves, chronic venous insufficiency skin changes BLE   Hx of DVT, venous US BLE was negative for DVT while in hospital. Off Eliquis.     Chronic diastolic heart failure, DOE, on and off wheezing, cough, occasional yellow phlegm in am. CXR no evidence of acute infection 10/2022. 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 Diclofenac, MRI completed, Neurosurgery consulted, not a good surgical candidate, chronic lower back/left hip/thigh.   Asthma  Epinephrine prn, Allegra, Flonase, Singulair  Rheumatoid arthritis (HCC)  taking steroids, and Tramadol, TSH 0.865 02/27/22   Family/ staff Communication: plan of care reviewed with the patient and  charge nurse.   Labs/tests ordered:  BMP pending.   Time spend 35 minutes.

## 2023-03-27 ENCOUNTER — Encounter: Payer: Self-pay | Admitting: Nurse Practitioner

## 2023-03-27 DIAGNOSIS — G9341 Metabolic encephalopathy: Secondary | ICD-10-CM | POA: Diagnosis not present

## 2023-03-27 DIAGNOSIS — R29898 Other symptoms and signs involving the musculoskeletal system: Secondary | ICD-10-CM | POA: Diagnosis not present

## 2023-03-27 DIAGNOSIS — R2689 Other abnormalities of gait and mobility: Secondary | ICD-10-CM | POA: Diagnosis not present

## 2023-03-27 DIAGNOSIS — I10 Essential (primary) hypertension: Secondary | ICD-10-CM | POA: Diagnosis not present

## 2023-03-27 LAB — BASIC METABOLIC PANEL
BUN: 13 (ref 4–21)
CO2: 30 — AB (ref 13–22)
Chloride: 104 (ref 99–108)
Creatinine: 1 (ref 0.5–1.1)
Glucose: 83
Potassium: 4.3 mEq/L (ref 3.5–5.1)
Sodium: 141 (ref 137–147)

## 2023-03-27 NOTE — Assessment & Plan Note (Signed)
taking steroids, and Tramadol, TSH 0.865 02/27/22 

## 2023-03-27 NOTE — Assessment & Plan Note (Signed)
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 Diclofenac, MRI completed, Neurosurgery consulted, not a good surgical candidate, chronic lower back/left hip/thigh.

## 2023-03-27 NOTE — Assessment & Plan Note (Signed)
Improved swollen legs since switched to Torsemide, no open wounds, pain in calves, chronic venous insufficiency skin changes BLE   Hx of DVT, venous US BLE was negative for DVT while in hospital. Off Eliquis.     Chronic diastolic heart failure, DOE, on and off wheezing, cough, occasional yellow phlegm in am. CXR no evidence of acute infection 10/2022. EF 60-65% 05/22/22, changed to Torsemide, on Amlodipine

## 2023-03-27 NOTE — Assessment & Plan Note (Signed)
Epinephrine prn, Allegra, Flonase, Singulair 

## 2023-03-28 DIAGNOSIS — G9341 Metabolic encephalopathy: Secondary | ICD-10-CM | POA: Diagnosis not present

## 2023-03-28 DIAGNOSIS — R2689 Other abnormalities of gait and mobility: Secondary | ICD-10-CM | POA: Diagnosis not present

## 2023-03-28 DIAGNOSIS — R29898 Other symptoms and signs involving the musculoskeletal system: Secondary | ICD-10-CM | POA: Diagnosis not present

## 2023-03-28 NOTE — Addendum Note (Signed)
Addended by: Nasiyah Laverdiere X on: 03/28/2023 11:07 AM   Modules accepted: Level of Service

## 2023-03-29 DIAGNOSIS — G9341 Metabolic encephalopathy: Secondary | ICD-10-CM | POA: Diagnosis not present

## 2023-03-29 DIAGNOSIS — R29898 Other symptoms and signs involving the musculoskeletal system: Secondary | ICD-10-CM | POA: Diagnosis not present

## 2023-03-29 DIAGNOSIS — R2689 Other abnormalities of gait and mobility: Secondary | ICD-10-CM | POA: Diagnosis not present

## 2023-04-02 DIAGNOSIS — R2689 Other abnormalities of gait and mobility: Secondary | ICD-10-CM | POA: Diagnosis not present

## 2023-04-02 DIAGNOSIS — G9341 Metabolic encephalopathy: Secondary | ICD-10-CM | POA: Diagnosis not present

## 2023-04-02 DIAGNOSIS — R29898 Other symptoms and signs involving the musculoskeletal system: Secondary | ICD-10-CM | POA: Diagnosis not present

## 2023-04-03 DIAGNOSIS — G9341 Metabolic encephalopathy: Secondary | ICD-10-CM | POA: Diagnosis not present

## 2023-04-03 DIAGNOSIS — R2689 Other abnormalities of gait and mobility: Secondary | ICD-10-CM | POA: Diagnosis not present

## 2023-04-03 DIAGNOSIS — R29898 Other symptoms and signs involving the musculoskeletal system: Secondary | ICD-10-CM | POA: Diagnosis not present

## 2023-04-05 DIAGNOSIS — G9341 Metabolic encephalopathy: Secondary | ICD-10-CM | POA: Diagnosis not present

## 2023-04-05 DIAGNOSIS — R2689 Other abnormalities of gait and mobility: Secondary | ICD-10-CM | POA: Diagnosis not present

## 2023-04-05 DIAGNOSIS — R29898 Other symptoms and signs involving the musculoskeletal system: Secondary | ICD-10-CM | POA: Diagnosis not present

## 2023-04-07 DIAGNOSIS — M810 Age-related osteoporosis without current pathological fracture: Secondary | ICD-10-CM | POA: Diagnosis not present

## 2023-04-07 DIAGNOSIS — M545 Low back pain, unspecified: Secondary | ICD-10-CM | POA: Diagnosis not present

## 2023-04-07 DIAGNOSIS — E2749 Other adrenocortical insufficiency: Secondary | ICD-10-CM | POA: Diagnosis not present

## 2023-04-07 DIAGNOSIS — M1380 Other specified arthritis, unspecified site: Secondary | ICD-10-CM | POA: Diagnosis not present

## 2023-04-07 DIAGNOSIS — M069 Rheumatoid arthritis, unspecified: Secondary | ICD-10-CM | POA: Diagnosis not present

## 2023-04-07 DIAGNOSIS — R0602 Shortness of breath: Secondary | ICD-10-CM | POA: Diagnosis not present

## 2023-04-09 DIAGNOSIS — R29898 Other symptoms and signs involving the musculoskeletal system: Secondary | ICD-10-CM | POA: Diagnosis not present

## 2023-04-09 DIAGNOSIS — R2689 Other abnormalities of gait and mobility: Secondary | ICD-10-CM | POA: Diagnosis not present

## 2023-04-09 DIAGNOSIS — G9341 Metabolic encephalopathy: Secondary | ICD-10-CM | POA: Diagnosis not present

## 2023-04-10 DIAGNOSIS — R29898 Other symptoms and signs involving the musculoskeletal system: Secondary | ICD-10-CM | POA: Diagnosis not present

## 2023-04-10 DIAGNOSIS — R2689 Other abnormalities of gait and mobility: Secondary | ICD-10-CM | POA: Diagnosis not present

## 2023-04-10 DIAGNOSIS — G9341 Metabolic encephalopathy: Secondary | ICD-10-CM | POA: Diagnosis not present

## 2023-04-11 DIAGNOSIS — R29898 Other symptoms and signs involving the musculoskeletal system: Secondary | ICD-10-CM | POA: Diagnosis not present

## 2023-04-11 DIAGNOSIS — M6281 Muscle weakness (generalized): Secondary | ICD-10-CM | POA: Diagnosis not present

## 2023-04-11 DIAGNOSIS — R2689 Other abnormalities of gait and mobility: Secondary | ICD-10-CM | POA: Diagnosis not present

## 2023-04-12 DIAGNOSIS — R2689 Other abnormalities of gait and mobility: Secondary | ICD-10-CM | POA: Diagnosis not present

## 2023-04-12 DIAGNOSIS — M6281 Muscle weakness (generalized): Secondary | ICD-10-CM | POA: Diagnosis not present

## 2023-04-12 DIAGNOSIS — R29898 Other symptoms and signs involving the musculoskeletal system: Secondary | ICD-10-CM | POA: Diagnosis not present

## 2023-04-16 DIAGNOSIS — N3000 Acute cystitis without hematuria: Secondary | ICD-10-CM | POA: Diagnosis not present

## 2023-04-16 DIAGNOSIS — R29898 Other symptoms and signs involving the musculoskeletal system: Secondary | ICD-10-CM | POA: Diagnosis not present

## 2023-04-16 DIAGNOSIS — M6281 Muscle weakness (generalized): Secondary | ICD-10-CM | POA: Diagnosis not present

## 2023-04-16 DIAGNOSIS — R2689 Other abnormalities of gait and mobility: Secondary | ICD-10-CM | POA: Diagnosis not present

## 2023-04-17 DIAGNOSIS — R29898 Other symptoms and signs involving the musculoskeletal system: Secondary | ICD-10-CM | POA: Diagnosis not present

## 2023-04-17 DIAGNOSIS — R2689 Other abnormalities of gait and mobility: Secondary | ICD-10-CM | POA: Diagnosis not present

## 2023-04-17 DIAGNOSIS — M6281 Muscle weakness (generalized): Secondary | ICD-10-CM | POA: Diagnosis not present

## 2023-04-18 DIAGNOSIS — R2689 Other abnormalities of gait and mobility: Secondary | ICD-10-CM | POA: Diagnosis not present

## 2023-04-18 DIAGNOSIS — M6281 Muscle weakness (generalized): Secondary | ICD-10-CM | POA: Diagnosis not present

## 2023-04-18 DIAGNOSIS — R29898 Other symptoms and signs involving the musculoskeletal system: Secondary | ICD-10-CM | POA: Diagnosis not present

## 2023-04-19 DIAGNOSIS — R2689 Other abnormalities of gait and mobility: Secondary | ICD-10-CM | POA: Diagnosis not present

## 2023-04-19 DIAGNOSIS — M6281 Muscle weakness (generalized): Secondary | ICD-10-CM | POA: Diagnosis not present

## 2023-04-19 DIAGNOSIS — R29898 Other symptoms and signs involving the musculoskeletal system: Secondary | ICD-10-CM | POA: Diagnosis not present

## 2023-05-07 DIAGNOSIS — M545 Low back pain, unspecified: Secondary | ICD-10-CM | POA: Diagnosis not present

## 2023-05-07 DIAGNOSIS — E2749 Other adrenocortical insufficiency: Secondary | ICD-10-CM | POA: Diagnosis not present

## 2023-05-07 DIAGNOSIS — M069 Rheumatoid arthritis, unspecified: Secondary | ICD-10-CM | POA: Diagnosis not present

## 2023-05-07 DIAGNOSIS — M1380 Other specified arthritis, unspecified site: Secondary | ICD-10-CM | POA: Diagnosis not present

## 2023-05-07 DIAGNOSIS — R0602 Shortness of breath: Secondary | ICD-10-CM | POA: Diagnosis not present

## 2023-05-07 DIAGNOSIS — M810 Age-related osteoporosis without current pathological fracture: Secondary | ICD-10-CM | POA: Diagnosis not present

## 2023-06-05 DIAGNOSIS — I10 Essential (primary) hypertension: Secondary | ICD-10-CM | POA: Diagnosis not present

## 2023-06-05 DIAGNOSIS — E559 Vitamin D deficiency, unspecified: Secondary | ICD-10-CM | POA: Diagnosis not present

## 2023-06-05 LAB — LIPID PANEL
Cholesterol: 151 (ref 0–200)
HDL: 47 (ref 35–70)
LDL Cholesterol: 77
Triglycerides: 174 — AB (ref 40–160)

## 2023-06-06 DIAGNOSIS — R2681 Unsteadiness on feet: Secondary | ICD-10-CM | POA: Diagnosis not present

## 2023-06-06 DIAGNOSIS — M6281 Muscle weakness (generalized): Secondary | ICD-10-CM | POA: Diagnosis not present

## 2023-06-07 ENCOUNTER — Non-Acute Institutional Stay: Payer: Medicare PPO | Admitting: Family Medicine

## 2023-06-07 DIAGNOSIS — I5032 Chronic diastolic (congestive) heart failure: Secondary | ICD-10-CM | POA: Diagnosis not present

## 2023-06-07 DIAGNOSIS — R0602 Shortness of breath: Secondary | ICD-10-CM | POA: Diagnosis not present

## 2023-06-07 DIAGNOSIS — M5116 Intervertebral disc disorders with radiculopathy, lumbar region: Secondary | ICD-10-CM | POA: Diagnosis not present

## 2023-06-07 DIAGNOSIS — M810 Age-related osteoporosis without current pathological fracture: Secondary | ICD-10-CM | POA: Diagnosis not present

## 2023-06-07 DIAGNOSIS — R600 Localized edema: Secondary | ICD-10-CM

## 2023-06-07 DIAGNOSIS — M1380 Other specified arthritis, unspecified site: Secondary | ICD-10-CM | POA: Diagnosis not present

## 2023-06-07 DIAGNOSIS — M069 Rheumatoid arthritis, unspecified: Secondary | ICD-10-CM | POA: Diagnosis not present

## 2023-06-07 DIAGNOSIS — M545 Low back pain, unspecified: Secondary | ICD-10-CM | POA: Diagnosis not present

## 2023-06-07 DIAGNOSIS — E2749 Other adrenocortical insufficiency: Secondary | ICD-10-CM | POA: Diagnosis not present

## 2023-06-07 NOTE — Progress Notes (Signed)
Provider:  Jacalyn Lefevre, MD Location:      Place of Service:     PCP: Mast, [name], NP [name], [name], [name]  Extended Emergency Contact Information Primary Emergency Contact: Irish Lack of [place] Home Phone: [phone] Mobile Phone: [phone] Relation: Daughter  Code Status:  Goals of Care: Advanced Directive information    03/26/2023    4:06 PM  Advanced Directives  Does Patient Have a Medical Advance Directive? Yes  Type of Estate agent of [place];Living will  Does patient want to make changes to medical advance directive? No - Patient declined  Copy of Healthcare Power of Attorney in Chart? Yes - validated most recent copy scanned in chart (See row information)      No chief complaint on file.   HPI: Patient is a 89 y.o. female seen today for medical management of chronic problems including: R chronic back pain with radiculitis followed by neurosurgery heumatoid arthritis, lower extremity edema, chronic diastolic heart failure.  And fibromyalgia. I last saw her about 3 months ago after returning from the hospital for a stay related to metabolic encephalopathy.  She had apparently gotten too much tramadol and experienced altered mental status.  Tramadol was reduced and she came back to the skilled facility for rehab before returning to her private apartment.  She is now living in assisted living. I encountered her and her assisted living room.  She sleeps until about midday and gets up to eat lunch and enjoys staying up later at night. She complains of back pain worse in the mornings that improves with moving about.  She is on chronic tramadol as well as steroids not only for the back pain but for her rheumatoid arthritis and fibromyalgia Past Medical History:  Diagnosis Date   Adrenal failure (HCC)    Arthritis    Asthma    Cancer (HCC)    Cataract    Closed  nondisplaced fracture of fifth right metatarsal bone 09/18/2017   Diverticulitis    Per patient   E coli bacteremia    Fibromyalgia 2008   GERD (gastroesophageal reflux disease) 12/18/2016   HCAP (healthcare-associated pneumonia) 02/03/2018   Hyperlipidemia 10/13/2021   Osteoporosis    RA (rheumatoid arthritis) (HCC)    Recurrent upper respiratory infection (URI)    Sepsis due to urinary tract infection (HCC) 01/18/2018   Urticaria    Past Surgical History:  Procedure Laterality Date   BACK SURGERY     BILATERAL CARPAL TUNNEL RELEASE  2005   right and left   CATARACT EXTRACTION, BILATERAL  2004   right and left   CERVICAL FUSION  2011,2010,2008   2 disks   HEEL SPUR SURGERY  2004   lower back fusion  2011   Fusion of 3-4 and 4-5 lower back   RADIOFREQUENCY ABLATION  2020   ROTATOR CUFF REPAIR  [id number]   SQUAMOUS CELL CARCINOMA EXCISION     TONSILLECTOMY AND ADENOIDECTOMY  1947   TOTAL SHOULDER ARTHROPLASTY      reports that she quit smoking about 56 years ago. Her smoking use included cigarettes. She has a 7.50 pack-year smoking history. She has never been exposed to tobacco smoke. She has never used smokeless tobacco. She reports that she does not drink alcohol and does not use drugs. Social History   Socioeconomic History   Marital status: Widowed    Spouse name: Not on file   Number of  children: 2   Years of education: Not on file   Highest education level: Not on file  Occupational History   Occupation: Retired Child psychotherapist  Tobacco Use   Smoking status: Former    Packs/day: 0.75    Years: 10.00    Additional pack years: 0.00    Total pack years: 7.50    Types: Cigarettes    Quit date: 1968    Years since quitting: 56.5    Passive exposure: Never   Smokeless tobacco: Never  Vaping Use   Vaping Use: Never used  Substance and Sexual Activity   Alcohol use: No   Drug use: No   Sexual activity: Not Currently  Other Topics Concern   Not on file  Social  History Narrative      Diet:        Do you drink/ eat things with caffeine? Dr. Reino Kent 2/ day      Marital status: Widowed                              What year were you married ? 1953      Do you live in a house, apartment,assistred living, condo, trailer, etc.)? Apartment      Is it one or more stories?       How many persons live in your home ? 1      Do you have any pets in your home ?(please list) No      Highest Level of education completed: PhD       Current or past profession: Paramedic, Child psychotherapist, Special Educator       Do you exercise?   No                           Type & how often       ADVANCED DIRECTIVES (Please bring copies)      Do you have a living will? Yes      Do you have a DNR form?                       If not, do you want to discuss one? Yes      Do you have signed POA?HPOA forms?                 If so, please bring to your appointment Tes      FUNCTIONAL STATUS- To be completed by Spouse / child / Staff       Do you have difficulty bathing or dressing yourself ? No      Do you have difficulty preparing food or eating ? No      Do you have difficulty managing your mediation ? No      Do you have difficulty managing your finances ? No      Do you have difficulty affording your medication ? No      Social Determinants of Health   Financial Resource Strain: Not on file  Food Insecurity: No Food Insecurity (02/12/2023)   Hunger Vital Sign    Worried About Running Out of Food in the Last Year: Never true    Ran Out of Food in the Last Year: Never true  Transportation Needs: No Transportation Needs (02/12/2023)   PRAPARE - Administrator, Civil Service (Medical): No    Lack of Transportation (Non-Medical):  No  Physical Activity: Not on file  Stress: Not on file  Social Connections: Not on file  Intimate Partner Violence: Not At Risk (02/12/2023)   Humiliation, Afraid, Rape, and Kick questionnaire    Fear of Current or Ex-Partner:  No    Emotionally Abused: No    Physically Abused: No    Sexually Abused: No    Functional Status Survey:    Family History  Problem Relation Age of Onset   Heart attack Maternal Grandmother    Heart attack Paternal Grandfather    Breast cancer Mother 4   Diabetes Father    Heart disease Father    Congestive Heart Failure Father 80       Died from   Allergic rhinitis Neg Hx    Asthma Neg Hx    Eczema Neg Hx    Urticaria Neg Hx     Health Maintenance  Topic Date Due   DTaP/Tdap/Td (1 - Tdap) Never done   COVID-19 Vaccine (5 - 2023-24 season) 03/11/2024 (Originally 08/11/2022)   INFLUENZA VACCINE  07/12/2023   Medicare Annual Wellness (AWV)  03/19/2024   Pneumonia Vaccine 14+ Years old  Completed   DEXA SCAN  Completed   Zoster Vaccines- Shingrix  Completed   HPV VACCINES  Aged Out    Allergies  Allergen Reactions   Adhesive [Tape] Rash   Celebrex [Celecoxib] Hives   Ciprofibrate Nausea Only   Cymbalta [Duloxetine Hcl] Swelling   Gabitril [Tiagabine] Swelling   Lyrica [Pregabalin] Swelling   Neurontin [Gabapentin] Swelling   Nexium [Esomeprazole] Rash   Nsaids Rash   Shrimp [Shellfish Allergy] Anaphylaxis    Per patient "shrimp only"   Azactam [Aztreonam]     Hand swelling    Azelastine Hcl     Rash    Ciprofloxacin Other (See Comments)    dizziness   Claritin [Loratadine]     Irritability Nervousness    Methotrexate Derivatives    Nasacort [Triamcinolone]     Dizzy    Olopatadine Other (See Comments)    Pain and lethargy    Other    Sulfamethizole Other (See Comments)    unknown   Zantac [Ranitidine Hcl] Other (See Comments)    unknown   Claritin-D 12 Hour [Loratadine-Pseudoephedrine Er] Anxiety   Keflex [Cephalexin] Nausea And Vomiting    Tolerated Ancef   Penicillins Rash    Injection site reaction. Tolerated cefepime in past. Also reports tolerating a penicillin infusion after this initial rxn ~20 yrs ago.  Has patient had a PCN reaction  causing immediate rash, facial/tongue/throat swelling, SOB or lightheadedness with hypotension: No Has patient had a PCN reaction causing severe rash involving mucus membranes or skin necrosis: No Has patient had a PCN reaction that required hospitalization No Has patient had a PCN reaction occurring within the last 10 years: No     Sulfa Antibiotics Nausea And Vomiting    Outpatient Encounter Medications as of 06/07/2023  Medication Sig   acetaminophen (TYLENOL) 325 MG tablet Take 650 mg by mouth every 4 (four) hours as needed.   amLODipine (NORVASC) 5 MG tablet Take 5 mg by mouth daily.   atorvastatin (LIPITOR) 20 MG tablet TAKE 1 TABLET BY MOUTH EVERY DAY   Biotin 5 MG TBDP Take 1 tablet (5 mg total) by mouth daily.   Cholecalciferol (VITAMIN D3) 125 MCG (5000 UT) CAPS TAKE 1 CAPSULE BY MOUTH EVERY DAY   colchicine 0.6 MG tablet TAKE 1 TABLET BY MOUTH EVERY DAY  CRANBERRY PO Take 1 capsule by mouth daily.   cycloSPORINE (RESTASIS) 0.05 % ophthalmic emulsion Place 1 drop into both eyes 2 (two) times daily.   diclofenac Sodium (VOLTAREN) 1 % GEL APPLY 2 TO 4 GRAMS TOPICALLY TO AFFECTED JOINTS UP TO 4 TIMES DAILY   fexofenadine (ALLEGRA) 180 MG tablet Take 1 tablet (180 mg total) by mouth daily.   furosemide (LASIX) 80 MG tablet Take 1 tablet (80 mg total) by mouth daily.   ipratropium-albuterol (DUONEB) 0.5-2.5 (3) MG/3ML SOLN Take 3 mLs by nebulization as needed.   lansoprazole (PREVACID) 30 MG capsule TAKE 1 CAPSULE BY MOUTH ONCE DAILY AT NOON   montelukast (SINGULAIR) 10 MG tablet TAKE 1 TABLET BY MOUTH EVERYDAY AT BEDTIME   potassium chloride SA (KLOR-CON M) 20 MEQ tablet Take 20 mEq by mouth 2 (two) times daily.   predniSONE (DELTASONE) 5 MG tablet Take 5 mg by mouth daily with breakfast.   traMADol (ULTRAM) 50 MG tablet Take 1 tablet (50 mg total) by mouth every 6 (six) hours as needed (pain).   No facility-administered encounter medications on file as of 06/07/2023.    Review  of Systems  Constitutional: Negative.   HENT: Negative.    Respiratory: Negative.    Cardiovascular:  Positive for leg swelling.  Gastrointestinal: Negative.   Genitourinary: Negative.   Musculoskeletal:  Positive for arthralgias, back pain and joint swelling.  Neurological: Negative.   Hematological:  Bruises/bleeds easily.  Psychiatric/Behavioral: Negative.      There were no vitals filed for this visit. There is no height or weight on file to calculate BMI. Physical Exam Constitutional:      Appearance: Normal appearance.  Cardiovascular:     Rate and Rhythm: Normal rate and regular rhythm.  Pulmonary:     Effort: Pulmonary effort is normal.     Breath sounds: Normal breath sounds.  Musculoskeletal:        General: Deformity present.     Comments: Fingers on her right hand deformed secondary to arthritis Moves about her room with walker but uses her motorized wheelchair for longer distances such as going to the dining room  Neurological:     General: No focal deficit present.     Mental Status: She is alert and oriented to person, place, and time.     Labs reviewed: Basic Metabolic Panel: Recent Labs    02/12/23 1120 02/13/23 0435 02/14/23 0420 02/20/23 0000  NA 139 140 141 142  K 4.1 3.4* 3.2* 4.0  CL 99 104 110 106  CO2 27 25 23  26*  GLUCOSE 125* 104* 174*  --   BUN 17 14 20  32*  CREATININE 1.09* 0.88 0.85 1.0  CALCIUM 9.4 9.2 9.1 9.1   Liver Function Tests: Recent Labs    10/27/22 0945 10/28/22 0357 02/12/23 1120 02/20/23 0000  AST 25 19 26  12*  ALT 17 14 15 16   ALKPHOS 61 47 61 53  BILITOT 1.4* 1.1 1.4*  --   PROT 7.6 6.3* 7.9  --   ALBUMIN 3.7 2.7* 3.8 3.5   No results for input(s): "LIPASE", "AMYLASE" in the last 8760 hours. No results for input(s): "AMMONIA" in the last 8760 hours. CBC: Recent Labs    10/28/22 0357 10/29/22 0343 10/30/22 1216 02/12/23 1120 02/13/23 0435 02/14/23 0420 02/20/23 0000  WBC 17.1* 13.6*   < > 15.7* 12.4*  9.1 12.0  NEUTROABS 14.6* 10.1*  --  10.8*  --   --   --   HGB  10.9* 9.8*   < > 13.7 11.4* 10.5* 12.5  HCT 35.8* 32.8*   < > 44.7 38.5 34.8* 38  MCV 98.6 99.7   < > 96.5 97.2 95.1  --   PLT 240 221   < > 278 250 251 345   < > = values in this interval not displayed.   Cardiac Enzymes: No results for input(s): "CKTOTAL", "CKMB", "CKMBINDEX", "TROPONINI" in the last 8760 hours. BNP: Invalid input(s): "POCBNP" Lab Results  Component Value Date   HGBA1C 6.7 (H) 02/15/2023   Lab Results  Component Value Date   TSH 0.865 02/27/2022   Lab Results  Component Value Date   VITAMINB12 1,377 (H) 05/26/2022   Lab Results  Component Value Date   FOLATE 26.8 02/06/2018   Lab Results  Component Value Date   IRON 89 02/06/2018   TIBC 281 02/06/2018   FERRITIN 74 02/06/2018    Imaging and Procedures obtained prior to SNF admission: MR LUMBAR SPINE WO CONTRAST  Result Date: 02/13/2023 CLINICAL DATA:  Lumbar radiculopathy EXAM: MRI LUMBAR SPINE WITHOUT CONTRAST TECHNIQUE: Multiplanar, multisequence MR imaging of the lumbar spine was performed. No intravenous contrast was administered. COMPARISON:  09/09/2019 FINDINGS: Evaluation is limited by motion. Segmentation: 5 lumbar type vertebral bodies. Alignment: Trace anterolisthesis of L5 on S1, unchanged. Mild levocurvature of the upper lumbar spine, with compensatory dextrocurvature of the lower lumbar spine. Vertebrae:  No fracture, evidence of discitis, or bone lesion. Conus medullaris and cauda equina: Conus extends to the L1 level. Conus and cauda equina appear normal. Paraspinal and other soft tissues: Renal cysts, for which no follow-up is currently indicated. Atrophy of the paraspinous musculature. Disc levels: T12-L1: Disc height loss and mild disc bulge. Mild facet arthropathy. Mild spinal canal stenosis and moderate right neural foraminal narrowing, unchanged. L1-L2: Disc height loss and mild disc bulge moderate facet arthropathy. Ligamentum  flavum hypertrophy. Mild-to-moderate spinal canal stenosis, unchanged. Narrowing of the lateral recesses. No neural foraminal narrowing. L2-L3: Disc height loss and minimal disc bulge with superimposed left subarticular disc extrusion with 13 mm caudal migration (series 5, image 11), which appears new from the prior exam. This contacts the descending left L3 nerve roots. Mild spinal canal stenosis. No significant neural foraminal narrowing. L3-L4: Prior fusion with interbody disc spacer. Mild facet arthropathy. Ligamentum flavum hypertrophy. Narrowing of the lateral recesses. Mild spinal canal stenosis and mild bilateral neural foraminal narrowing, unchanged. L4-L5: Prior fusion with interbody disc spacer. Mild facet arthropathy. Narrowing of the lateral recesses. No spinal canal stenosis. Mild bilateral neural foraminal narrowing, unchanged. L5-S1: Trace anterolisthesis with disc unroofing and small central disc protrusion. Moderate facet arthropathy. No spinal canal stenosis or neural foraminal narrowing. IMPRESSION: 1. Evaluation is limited by motion. Within this limitation, there is a new left subarticular disc extrusion at L2-L3 with caudal migration, which contacts the descending left L3 nerve roots. 2. L1-L2 mild-to-moderate spinal canal stenosis, unchanged. 3. T12-L1 mild spinal canal stenosis and moderate right neural foraminal narrowing, unchanged. 4. L3-L4 mild spinal canal stenosis and mild bilateral neural foraminal narrowing, unchanged. L4-L5 mild bilateral neural foraminal narrowing, unchanged. 5. Narrowing of the lateral recesses at L1-L2, L3-L4, and L4-L5 could affect the descending L2, L4, and L5 nerve roots, respectively. Electronically Signed   By: Wiliam Ke M.D.   On: 02/13/2023 02:45   CT HEAD WO CONTRAST ( )  Result Date: 02/12/2023 CLINICAL DATA:  Neuro deficit, acute, stroke suspected EXAM: CT HEAD WITHOUT CONTRAST TECHNIQUE: Contiguous axial images were obtained from  the base of  the skull through the vertex without intravenous contrast. RADIATION DOSE REDUCTION: This exam was performed according to the departmental dose-optimization program which includes automated exposure control, adjustment of the mA and/or kV according to patient size and/or use of iterative reconstruction technique. COMPARISON:  None Available. FINDINGS: Brain: No evidence of acute large vascular territory infarction, hemorrhage, hydrocephalus, extra-axial collection or mass lesion/mass effect. Moderate patchy white matter hypodensities, nonspecific but compatible with chronic microvascular ischemic disease. Cerebral atrophy Vascular: No hyperdense vessel. Skull: No acute fracture. Sinuses/Orbits: Mild paranasal sinus mucosal thickening. No acute orbital findings. Other: No mastoid effusions. IMPRESSION: 1. No evidence of acute intracranial abnormality. 2. Moderate chronic microvascular ischemic disease. Electronically Signed   By: Feliberto Harts M.D.   On: 02/12/2023 17:54   CT ABDOMEN PELVIS W CONTRAST  Result Date: 02/12/2023 CLINICAL DATA:  89 year old female status post fall from toilet this morning. Abdominal pain. EXAM: CT ABDOMEN AND PELVIS WITH CONTRAST TECHNIQUE: Multidetector CT imaging of the abdomen and pelvis was performed using the standard protocol following bolus administration of intravenous contrast. RADIATION DOSE REDUCTION: This exam was performed according to the departmental dose-optimization program which includes automated exposure control, adjustment of the mA and/or kV according to patient size and/or use of iterative reconstruction technique. CONTRAST:  OMNIPAQUE IOHEXOL 300 MG/ML  SOLN COMPARISON:  CT Abdomen and Pelvis 05/21/2022. FINDINGS: Lower chest: Lung bases are stable from last year with combined mild atelectasis, scarring, and also chronic postinflammatory small lung nodularity. No pericardial effusion, pleural effusion, or acute lung base opacity. Hepatobiliary: Liver  and gallbladder appear stable and negative. Pancreas: Chronic pancreatic atrophy. Spleen: Negative. Adrenals/Urinary Tract: Normal adrenal glands. Kidneys are stable and nonobstructed. Small chronic benign appearing renal cysts (no follow-up imaging recommended). Symmetric renal enhancement and contrast excretion. No hydroureter. Unremarkable bladder. Stomach/Bowel: Chronic sigmoid diverticulosis, maximal in the anterior sigmoid and junction with the descending colon. That segment appears stable from the CT last year, no definite active inflammation. Mild large bowel redundancy and retained stool otherwise. Cecum located in the midline today. Elongated but normal appendix tracking to the right (series 2, image 58). No dilated small bowel. Small gastric hiatal hernia or phrenic ampulla (normal variant). Otherwise decompressed stomach and duodenum. No free air or free fluid identified. Stable chronic rectus muscle diastasis. Vascular/Lymphatic: Aortoiliac calcified atherosclerosis. Normal abdominal aortic caliber. Major arterial structures remain patent. Portal venous system is patent. No lymphadenopathy identified. Reproductive: Stable simple 3.8 cm left ovarian cyst since at least 2019 (no follow-up imaging recommended). Diminutive or absent uterus. Negative right ovary. Other: No pelvis free fluid. Musculoskeletal: Advanced chronic spine degeneration and previous lumbar interbody fusion. Background osteopenia. 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 No acute osseous abnormality identified. But evidence of a new caudal left lateral recess disc extrusion since last year at L2-L3 (series 2, image 36). IMPRESSION: 1. No acute traumatic injury identified. No acute or inflammatory process identified in the abdomen or pelvis. 2. 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 Query left L3 radiculitis. 3.  Aortic Atherosclerosis (ICD10-I70.0).  Electronically Signed   By: Odessa Fleming M.D.   On: 02/12/2023 13:04   DG Chest Port 1 View  Result Date: 02/12/2023 CLINICAL DATA:  Questionable sepsis EXAM: PORTABLE CHEST 1 VIEW COMPARISON:  10/30/2022 FINDINGS: Low volume chest. There is no edema, consolidation, effusion, or pneumothorax. Normal heart size and mediastinal contours. Artifact from EKG leads. IMPRESSION: Stable low  volume chest.  No acute finding. Electronically Signed   By: Tiburcio Pea M.D.   On: 02/12/2023 11:08   DG Foot Complete Left  Result Date: 02/12/2023 CLINICAL DATA:  Fall.  Questionable sepsis. EXAM: LEFT FOOT - COMPLETE 3+ VIEW COMPARISON:  10/30/2022 FINDINGS: There is no evidence of fracture or dislocation. There is no evidence of arthropathy or other focal bone abnormality. Soft tissues are unremarkable. IMPRESSION: Negative. Electronically Signed   By: Tiburcio Pea M.D.   On: 02/12/2023 11:07    Assessment/Plan 1. Age-related osteoporosis without current pathological fracture Not currently on treatment  2. Bilateral lower extremity edema Has responded well to Lasix 80 mg  3. Chronic diastolic heart failure (HCC) No acute symptoms.  Does have DuoNebs ordered as needed blood pressure is treated with amlodipine 5 mg .  4. 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 Hyacinth Meeker, MD [facility] [address] [place], [place] [address] Office [phone]

## 2023-06-11 DIAGNOSIS — R2681 Unsteadiness on feet: Secondary | ICD-10-CM | POA: Diagnosis not present

## 2023-06-11 DIAGNOSIS — M6281 Muscle weakness (generalized): Secondary | ICD-10-CM | POA: Diagnosis not present

## 2023-06-12 DIAGNOSIS — I10 Essential (primary) hypertension: Secondary | ICD-10-CM | POA: Diagnosis not present

## 2023-06-12 LAB — CBC AND DIFFERENTIAL
HCT: 35 — AB (ref 36–46)
Hemoglobin: 11.1 — AB (ref 12.0–16.0)
Neutrophils Absolute: 3462
Platelets: 265 10*3/uL (ref 150–400)
WBC: 8.9

## 2023-06-12 LAB — CBC: RBC: 3.9 (ref 3.87–5.11)

## 2023-06-19 DIAGNOSIS — R2681 Unsteadiness on feet: Secondary | ICD-10-CM | POA: Diagnosis not present

## 2023-06-19 DIAGNOSIS — M6281 Muscle weakness (generalized): Secondary | ICD-10-CM | POA: Diagnosis not present

## 2023-06-21 DIAGNOSIS — D045 Carcinoma in situ of skin of trunk: Secondary | ICD-10-CM | POA: Diagnosis not present

## 2023-06-21 DIAGNOSIS — B078 Other viral warts: Secondary | ICD-10-CM | POA: Diagnosis not present

## 2023-06-21 DIAGNOSIS — Z85828 Personal history of other malignant neoplasm of skin: Secondary | ICD-10-CM | POA: Diagnosis not present

## 2023-06-21 DIAGNOSIS — L570 Actinic keratosis: Secondary | ICD-10-CM | POA: Diagnosis not present

## 2023-06-21 DIAGNOSIS — D485 Neoplasm of uncertain behavior of skin: Secondary | ICD-10-CM | POA: Diagnosis not present

## 2023-06-26 DIAGNOSIS — M069 Rheumatoid arthritis, unspecified: Secondary | ICD-10-CM | POA: Diagnosis not present

## 2023-06-26 DIAGNOSIS — R2681 Unsteadiness on feet: Secondary | ICD-10-CM | POA: Diagnosis not present

## 2023-06-26 DIAGNOSIS — M5116 Intervertebral disc disorders with radiculopathy, lumbar region: Secondary | ICD-10-CM | POA: Diagnosis not present

## 2023-06-26 DIAGNOSIS — M6281 Muscle weakness (generalized): Secondary | ICD-10-CM | POA: Diagnosis not present

## 2023-07-03 DIAGNOSIS — R2681 Unsteadiness on feet: Secondary | ICD-10-CM | POA: Diagnosis not present

## 2023-07-03 DIAGNOSIS — M6281 Muscle weakness (generalized): Secondary | ICD-10-CM | POA: Diagnosis not present

## 2023-07-07 DIAGNOSIS — R0602 Shortness of breath: Secondary | ICD-10-CM | POA: Diagnosis not present

## 2023-07-07 DIAGNOSIS — M069 Rheumatoid arthritis, unspecified: Secondary | ICD-10-CM | POA: Diagnosis not present

## 2023-07-07 DIAGNOSIS — E2749 Other adrenocortical insufficiency: Secondary | ICD-10-CM | POA: Diagnosis not present

## 2023-07-07 DIAGNOSIS — M1380 Other specified arthritis, unspecified site: Secondary | ICD-10-CM | POA: Diagnosis not present

## 2023-07-07 DIAGNOSIS — M810 Age-related osteoporosis without current pathological fracture: Secondary | ICD-10-CM | POA: Diagnosis not present

## 2023-07-07 DIAGNOSIS — M545 Low back pain, unspecified: Secondary | ICD-10-CM | POA: Diagnosis not present

## 2023-07-10 DIAGNOSIS — M6281 Muscle weakness (generalized): Secondary | ICD-10-CM | POA: Diagnosis not present

## 2023-07-10 DIAGNOSIS — R2681 Unsteadiness on feet: Secondary | ICD-10-CM | POA: Diagnosis not present

## 2023-07-17 DIAGNOSIS — R2681 Unsteadiness on feet: Secondary | ICD-10-CM | POA: Diagnosis not present

## 2023-07-17 DIAGNOSIS — M6281 Muscle weakness (generalized): Secondary | ICD-10-CM | POA: Diagnosis not present

## 2023-08-07 ENCOUNTER — Non-Acute Institutional Stay: Payer: Self-pay | Admitting: Nurse Practitioner

## 2023-08-07 ENCOUNTER — Encounter: Payer: Self-pay | Admitting: Nurse Practitioner

## 2023-08-07 DIAGNOSIS — I10 Essential (primary) hypertension: Secondary | ICD-10-CM | POA: Insufficient documentation

## 2023-08-07 DIAGNOSIS — I5032 Chronic diastolic (congestive) heart failure: Secondary | ICD-10-CM

## 2023-08-07 DIAGNOSIS — K219 Gastro-esophageal reflux disease without esophagitis: Secondary | ICD-10-CM | POA: Diagnosis not present

## 2023-08-07 DIAGNOSIS — Z86718 Personal history of other venous thrombosis and embolism: Secondary | ICD-10-CM | POA: Diagnosis not present

## 2023-08-07 NOTE — Progress Notes (Unsigned)
Location:  [facility] Room Number: [id number] Place of Service:  ALF (13) Provider:  Aleera Gilcrease X, NP  Patient Care Team: Glyndon Tursi X, [name]  Extended Emergency Contact Information Primary Emergency Contact: [name] [place] of [place] Home Phone: [phone] Mobile Phone: [phone] Relation: Daughter  Code Status:  Full Code  Goals of care: Advanced Directive information    08/07/2023   12:23 PM  Advanced Directives  Does Patient Have a Medical Advance Directive? Yes  Type of Advance Directive Healthcare Power of Attorney  Does patient want to make changes to medical advance directive? No - Patient declined  Copy of Healthcare Power of Attorney in Chart? Yes - validated most recent copy scanned in chart (See row information)     Chief Complaint  Patient presents with   Medical Management of Chronic Issues    Routine visit. Discuss need for td/tdap and flu vaccine.     HPI:  Pt is a 89 y.o. female seen today for medical management of chronic diseases.     Past Medical History:  Diagnosis Date   Adrenal failure (HCC)    Arthritis    Asthma    Cancer (HCC)    Cataract    Closed nondisplaced fracture of fifth right metatarsal bone 09/18/2017   Diverticulitis    Per patient   E coli bacteremia    Fibromyalgia 2008   GERD (gastroesophageal reflux disease) 12/18/2016   HCAP (healthcare-associated pneumonia) 02/03/2018   Hyperlipidemia 10/13/2021   Osteoporosis    RA (rheumatoid arthritis) (HCC)    Recurrent upper respiratory infection (URI)    Sepsis due to urinary tract infection (HCC) 01/18/2018   Urticaria    Past Surgical History:  Procedure Laterality Date   BACK SURGERY     BILATERAL CARPAL TUNNEL RELEASE  2005   right and left   CATARACT EXTRACTION, BILATERAL  2004   right and left   CERVICAL FUSION  2011,2010,2008   2 disks   HEEL SPUR SURGERY  2004   lower back fusion   2011   Fusion of 3-4 and 4-5 lower back   RADIOFREQUENCY ABLATION  2020   ROTATOR CUFF REPAIR  [id number]   SQUAMOUS CELL CARCINOMA EXCISION     TONSILLECTOMY AND ADENOIDECTOMY  1947   TOTAL SHOULDER ARTHROPLASTY      Allergies  Allergen Reactions   Adhesive [Tape] Rash   Celebrex [Celecoxib] Hives   Ciprofibrate Nausea Only   Cymbalta [Duloxetine Hcl] Swelling   Gabitril [Tiagabine] Swelling   Lyrica [Pregabalin] Swelling   Neurontin [Gabapentin] Swelling   Nexium [Esomeprazole] Rash   Nsaids Rash   Shrimp [Shellfish Allergy] Anaphylaxis    Per patient "shrimp only"   Azactam [Aztreonam]     Hand swelling    Azelastine Hcl     Rash    Ciprofloxacin Other (See Comments)    dizziness   Claritin [Loratadine]     Irritability Nervousness    Methotrexate Derivatives    Nasacort [Triamcinolone]     Dizzy    Olopatadine Other (See Comments)    Pain and lethargy    Other    Sulfamethizole Other (See Comments)    unknown   Zantac [Ranitidine Hcl] Other (See Comments)    unknown   Claritin-D 12 Hour [Loratadine-Pseudoephedrine Er] Anxiety   Keflex [Cephalexin] Nausea And Vomiting    Tolerated Ancef   Penicillins Rash    Injection site reaction. Tolerated cefepime in  past. Also reports tolerating a penicillin infusion after this initial rxn ~20 yrs ago.  Has patient had a PCN reaction causing immediate rash, facial/tongue/throat swelling, SOB or lightheadedness with hypotension: No Has patient had a PCN reaction causing severe rash involving mucus membranes or skin necrosis: No Has patient had a PCN reaction that required hospitalization No Has patient had a PCN reaction occurring within the last 10 years: No     Sulfa Antibiotics Nausea And Vomiting    Outpatient Encounter Medications as of 08/07/2023  Medication Sig   acetaminophen (TYLENOL) 325 MG tablet Take 650 mg by mouth every 4 (four) hours as needed.   amLODipine (NORVASC) 5 MG tablet Take 5 mg by mouth daily.    atorvastatin (LIPITOR) 20 MG tablet TAKE 1 TABLET BY MOUTH EVERY DAY   Biotin 5 MG TBDP Take 1 tablet (5 mg total) by mouth daily.   Cholecalciferol (VITAMIN D3) 125 MCG (5000 UT) CAPS TAKE 1 CAPSULE BY MOUTH EVERY DAY   colchicine 0.6 MG tablet TAKE 1 TABLET BY MOUTH EVERY DAY   CRANBERRY PO Take 1 capsule by mouth daily.   cycloSPORINE (RESTASIS) 0.05 % ophthalmic emulsion Place 1 drop into both eyes 2 (two) times daily.   D-Mannose 500 MG CAPS Take 1 tablet by mouth daily.   diclofenac Sodium (VOLTAREN) 1 % GEL APPLY 2 TO 4 GRAMS TOPICALLY TO AFFECTED JOINTS UP TO 4 TIMES DAILY   fexofenadine (ALLEGRA) 180 MG tablet Take 1 tablet (180 mg total) by mouth daily.   ipratropium-albuterol (DUONEB) 0.5-2.5 (3) MG/3ML SOLN Take 3 mLs by nebulization as needed.   lansoprazole (PREVACID) 30 MG capsule TAKE 1 CAPSULE BY MOUTH ONCE DAILY AT NOON   montelukast (SINGULAIR) 10 MG tablet TAKE 1 TABLET BY MOUTH EVERYDAY AT BEDTIME   potassium chloride SA (KLOR-CON M) 20 MEQ tablet Take 40 mEq by mouth 2 (two) times daily.   predniSONE (DELTASONE) 5 MG tablet Take 5 mg by mouth daily with breakfast.   saccharomyces boulardii (FLORASTOR) 250 MG capsule Take 250 mg by mouth 2 (two) times daily.   Torsemide 40 MG TABS Take 1 tablet by mouth 2 (two) times daily.   traMADol (ULTRAM) 50 MG tablet Take 1 tablet (50 mg total) by mouth every 6 (six) hours as needed (pain).   [DISCONTINUED] furosemide (LASIX) 80 MG tablet Take 1 tablet (80 mg total) by mouth daily.   No facility-administered encounter medications on file as of 08/07/2023.    Review of Systems  Immunization History  Administered Date(s) Administered   Fluad Quad(high Dose 65+) 09/30/2020   Influenza, High Dose Seasonal PF 08/16/2018, 09/08/2019, 09/24/2020, 10/04/2021   Influenza,inj,Quad PF,6+ Mos 08/11/2016   Influenza-Unspecified 11/12/2017, 10/05/2022   Moderna Covid-19 Vaccine Bivalent Booster 60yrs & up 10/04/2021   Moderna  Sars-Covid-2 Vaccination 12/15/2019, 01/12/2020, 10/19/2020   PNEUMOCOCCAL CONJUGATE-20 10/04/2021   PPD Test 03/01/2018   Pneumococcal Conjugate-13 06/26/2017   Pneumococcal Polysaccharide-23 12/11/2009   Zoster Recombinant(Shingrix) 01/28/2019, 09/02/2019   Pertinent  Health Maintenance Due  Topic Date Due   INFLUENZA VACCINE  07/12/2023   DEXA SCAN  Completed      01/31/2023    1:08 PM 02/19/2023    2:29 PM 03/06/2023   11:57 AM 03/13/2023   10:48 AM 03/22/2023   10:53 AM  Fall Risk  Falls in the past year? 0 1 0 0 0  Was there an injury with Fall? 0 1 0 0 0  Fall Risk Category Calculator 0 3 0  0 0  Patient at Risk for Falls Due to Impaired balance/gait;Impaired mobility History of fall(s);Impaired balance/gait History of fall(s);Impaired balance/gait History of fall(s);Impaired balance/gait History of fall(s);Impaired balance/gait  Fall risk Follow up Falls evaluation completed Falls evaluation completed Falls evaluation completed Falls evaluation completed Falls evaluation completed   Functional Status Survey:    Vitals:   08/07/23 1220 08/08/23 1558  BP: (!) 145/87 (!) 145/87  Pulse: 74   Resp: 16   Temp: 97.8 F (36.6 C)   SpO2: 93%   Weight: 171 lb 9.6 oz (77.8 kg)   Height: 5\' 2"  (1.575 m)    Body mass index is 31.39 kg/m. Physical Exam  Labs reviewed: Recent Labs    02/12/23 1120 02/13/23 0435 02/14/23 0420 02/20/23 0000 03/27/23 0000  NA 139 140 141 142 141  K 4.1 3.4* 3.2* 4.0 4.3  CL 99 104 110 106 104  CO2 27 25 23  26* 30*  GLUCOSE 125* 104* 174*  --   --   BUN 17 14 20  32* 13  CREATININE 1.09* 0.88 0.85 1.0 1.0  CALCIUM 9.4 9.2 9.1 9.1  --    Recent Labs    10/27/22 0945 10/28/22 0357 02/12/23 1120 02/20/23 0000  AST 25 19 26  12*  ALT 17 14 15 16   ALKPHOS 61 47 61 53  BILITOT 1.4* 1.1 1.4*  --   PROT 7.6 6.3* 7.9  --   ALBUMIN 3.7 2.7* 3.8 3.5   Recent Labs    10/29/22 0343 10/30/22 1216 02/12/23 1120 02/13/23 0435 02/14/23 0420  02/20/23 0000 06/12/23 0000  WBC 13.6*   < > 15.7* 12.4* 9.1 12.0 8.9  NEUTROABS 10.1*  --  10.8*  --   --   --  3,462.00  HGB 9.8*   < > 13.7 11.4* 10.5* 12.5 11.1*  HCT 32.8*   < > 44.7 38.5 34.8* 38 35*  MCV 99.7   < > 96.5 97.2 95.1  --   --   PLT 221   < > 278 250 251 345 265   < > = values in this interval not displayed.   Lab Results  Component Value Date   TSH 0.865 02/27/2022   Lab Results  Component Value Date   HGBA1C 6.7 (H) 02/15/2023   Lab Results  Component Value Date   CHOL 151 06/05/2023   HDL 47 06/05/2023   LDLCALC 77 06/05/2023   TRIG 174 (A) 06/05/2023   CHOLHDL 2.6 04/06/2020    Significant Diagnostic Results in last 30 days:  No results found.  Assessment/Plan 1. Chronic diastolic heart failure (HCC)   2. Gastroesophageal reflux disease, unspecified whether esophagitis present   3. History of DVT (deep vein thrombosis)     Family/ staff Communication:   Labs/tests ordered:

## 2023-08-07 NOTE — Progress Notes (Unsigned)
Location:   [facility] Room Number: [id number] Place of Service:  ALF (13) Provider: Arna Snipe Consepcion Utt NP  Yenty Bloch X, NP  Patient Care Team: Cornelious Diven X, [name]  Extended Emergency Contact Information Primary Emergency Contact: Irish [place] of [place] Home Phone: [phone] Mobile Phone: [phone] Relation: Daughter  Code Status:  DNR Goals of care: Advanced Directive information    08/07/2023   12:23 PM  Advanced Directives  Does Patient Have a Medical Advance Directive? Yes  Type of Advance Directive Healthcare Power of Attorney  Does patient want to make changes to medical advance directive? No - Patient declined  Copy of Healthcare Power of Attorney in Chart? Yes - validated most recent copy scanned in chart (See row information)     Chief Complaint  Patient presents with   Medical Management of Chronic Issues    Routine visit. Discuss need for td/tdap and flu vaccine.     HPI:  Pt is a 89 y.o. female seen today for medical management of chronic diseases.                  Chronic diastolic heart failure, DOE, on and off wheezing, cough, occasional yellow phlegm in am. CXR no evidence of acute infection 10/2022. 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 Diclofenac, MRI completed, Neurosurgery consulted, not a good surgical candidate.                           Hgb a1c 6.7 02/15/23, prediabetic, on Steroids, monitor CBG             The right middle finger stiffness, difficulty eating.  Hypokalemia, K 3.4 03/20/23, on Kcl             Hx of sinus tachycardia, heart rate is in control.              Hx of aute sigmoid diverticulitis with pneumoperitoneum, f/u Dr. Sheliah Hatch, diarrhea on and off, negative C-diff while in hospital.              Asthma Epinephrine prn, Allegra, Flonase,  Singulair             RA/fibromyalgia, taking steroids, and Tramadol, TSH 0.865 02/27/22             GERD off acid reducer. Hgb 12.5 02/20/23             Venous insufficiency/Edema , takes Torsemide, Kcl.             OP Rheumatology, off Alendronate, on Vit D, t score -1.8 06/06/21             Hyperlipidemia, takes Atorvastatin             Vit B12 deficiency, on Vit B12 po, Vit B12 235 06/20/21             CT left adnexa cyst 11/2020, MRI 05/08/21,  05/22/22 renal US shoed No evidence of renal mass or hydronephrosis.             Gout, R ankle, stable, on Colchicine             Hx of DVT, venous US BLE was negative for DVT while in hospital. Off Eliquis.   HTN, on Amlodipine.       Past Medical  History:  Diagnosis Date   Adrenal failure (HCC)    Arthritis    Asthma    Cancer (HCC)    Cataract    Closed nondisplaced fracture of fifth right metatarsal bone 09/18/2017   Diverticulitis    Per patient   E coli bacteremia    Fibromyalgia 2008   GERD (gastroesophageal reflux disease) 12/18/2016   HCAP (healthcare-associated pneumonia) 02/03/2018   Hyperlipidemia 10/13/2021   Osteoporosis    RA (rheumatoid arthritis) (HCC)    Recurrent upper respiratory infection (URI)    Sepsis due to urinary tract infection (HCC) 01/18/2018   Urticaria    Past Surgical History:  Procedure Laterality Date   BACK SURGERY     BILATERAL CARPAL TUNNEL RELEASE  2005   right and left   CATARACT EXTRACTION, BILATERAL  2004   right and left   CERVICAL FUSION  2011,2010,2008   2 disks   HEEL SPUR SURGERY  2004   lower back fusion  2011   Fusion of 3-4 and 4-5 lower back   RADIOFREQUENCY ABLATION  2020   ROTATOR CUFF REPAIR  [id number]   SQUAMOUS CELL CARCINOMA EXCISION     TONSILLECTOMY AND ADENOIDECTOMY  1947   TOTAL SHOULDER ARTHROPLASTY      Allergies  Allergen Reactions   Adhesive [Tape] Rash   Celebrex [Celecoxib] Hives   Ciprofibrate Nausea Only   Cymbalta [Duloxetine Hcl] Swelling    Gabitril [Tiagabine] Swelling   Lyrica [Pregabalin] Swelling   Neurontin [Gabapentin] Swelling   Nexium [Esomeprazole] Rash   Nsaids Rash   Shrimp [Shellfish Allergy] Anaphylaxis    Per patient "shrimp only"   Azactam [Aztreonam]     Hand swelling    Azelastine Hcl     Rash    Ciprofloxacin Other (See Comments)    dizziness   Claritin [Loratadine]     Irritability Nervousness    Methotrexate Derivatives    Nasacort [Triamcinolone]     Dizzy    Olopatadine Other (See Comments)    Pain and lethargy    Other    Sulfamethizole Other (See Comments)    unknown   Zantac [Ranitidine Hcl] Other (See Comments)    unknown   Claritin-D 12 Hour [Loratadine-Pseudoephedrine Er] Anxiety   Keflex [Cephalexin] Nausea And Vomiting    Tolerated Ancef   Penicillins Rash    Injection site reaction. Tolerated cefepime in past. Also reports tolerating a penicillin infusion after this initial rxn ~20 yrs ago.  Has patient had a PCN reaction causing immediate rash, facial/tongue/throat swelling, SOB or lightheadedness with hypotension: No Has patient had a PCN reaction causing severe rash involving mucus membranes or skin necrosis: No Has patient had a PCN reaction that required hospitalization No Has patient had a PCN reaction occurring within the last 10 years: No     Sulfa Antibiotics Nausea And Vomiting    Allergies as of 08/07/2023       Reactions   Adhesive [tape] Rash   Celebrex [celecoxib] Hives   Ciprofibrate Nausea Only   Cymbalta [duloxetine Hcl] Swelling   Gabitril [tiagabine] Swelling   Lyrica [pregabalin] Swelling   Neurontin [gabapentin] Swelling   Nexium [esomeprazole] Rash   Nsaids Rash   Shrimp [shellfish Allergy] Anaphylaxis   Per patient "shrimp only"   Azactam [aztreonam]    Hand swelling    Azelastine Hcl    Rash    Ciprofloxacin Other (See Comments)   dizziness   Claritin [loratadine]    Irritability Nervousness  Methotrexate Derivatives    Nasacort  [triamcinolone]    Dizzy    Olopatadine Other (See Comments)   Pain and lethargy    Other    Sulfamethizole Other (See Comments)   unknown   Zantac [ranitidine Hcl] Other (See Comments)   unknown   Claritin-d 12 Hour [loratadine-pseudoephedrine Er] Anxiety   Keflex [cephalexin] Nausea And Vomiting   Tolerated Ancef   Penicillins Rash   Injection site reaction. Tolerated cefepime in past. Also reports tolerating a penicillin infusion after this initial rxn ~20 yrs ago.  Has patient had a PCN reaction causing immediate rash, facial/tongue/throat swelling, SOB or lightheadedness with hypotension: No Has patient had a PCN reaction causing severe rash involving mucus membranes or skin necrosis: No Has patient had a PCN reaction that required hospitalization No Has patient had a PCN reaction occurring within the last 10 years: No   Sulfa Antibiotics Nausea And Vomiting        Medication List        Accurate as of August 07, 2023 11:59 PM. If you have any questions, ask your nurse or doctor.          STOP taking these medications    furosemide 80 MG tablet Commonly known as: LASIX Stopped by: Windsor Zirkelbach X Valdemar Mcclenahan       TAKE these medications    acetaminophen 325 MG tablet Commonly known as: TYLENOL Take 650 mg by mouth every 4 (four) hours as needed.   amLODipine 5 MG tablet Commonly known as: NORVASC Take 5 mg by mouth daily.   atorvastatin 20 MG tablet Commonly known as: LIPITOR TAKE 1 TABLET BY MOUTH EVERY DAY   Biotin 5 MG Tbdp Take 1 tablet (5 mg total) by mouth daily.   colchicine 0.6 MG tablet TAKE 1 TABLET BY MOUTH EVERY DAY   CRANBERRY PO Take 1 capsule by mouth daily.   cycloSPORINE 0.05 % ophthalmic emulsion Commonly known as: RESTASIS Place 1 drop into both eyes 2 (two) times daily.   D-Mannose 500 MG Caps Take 1 tablet by mouth daily.   diclofenac Sodium 1 % Gel Commonly known as: VOLTAREN APPLY 2 TO 4 GRAMS TOPICALLY TO AFFECTED JOINTS UP TO 4  TIMES DAILY   fexofenadine 180 MG tablet Commonly known as: ALLEGRA Take 1 tablet (180 mg total) by mouth daily.   ipratropium-albuterol 0.5-2.5 (3) MG/3ML Soln Commonly known as: DUONEB Take 3 mLs by nebulization as needed.   lansoprazole 30 MG capsule Commonly known as: PREVACID TAKE 1 CAPSULE BY MOUTH ONCE DAILY AT NOON   montelukast 10 MG tablet Commonly known as: SINGULAIR TAKE 1 TABLET BY MOUTH EVERYDAY AT BEDTIME   potassium chloride SA 20 MEQ tablet Commonly known as: KLOR-CON M Take 40 mEq by mouth 2 (two) times daily.   predniSONE 5 MG tablet Commonly known as: DELTASONE Take 5 mg by mouth daily with breakfast.   saccharomyces boulardii 250 MG capsule Commonly known as: FLORASTOR Take 250 mg by mouth 2 (two) times daily.   Torsemide 40 MG Tabs Take 1 tablet by mouth 2 (two) times daily.   traMADol 50 MG tablet Commonly known as: ULTRAM Take 1 tablet (50 mg total) by mouth every 6 (six) hours as needed (pain).   Vitamin D3 125 MCG (5000 UT) Caps TAKE 1 CAPSULE BY MOUTH EVERY DAY        Review of Systems  Constitutional:  Negative for appetite change, fatigue and fever.  HENT:  Negative for congestion, sinus pressure  and trouble swallowing.   Eyes:  Negative for visual disturbance.  Respiratory:  Positive for shortness of breath. Negative for cough, chest tightness and wheezing.        DOE, occasional yellow phlegm in am, hacking cough.   Cardiovascular:  Positive for leg swelling.  Gastrointestinal:  Negative for abdominal pain, constipation, nausea and vomiting.       On and off diarrhea.   Genitourinary:  Negative for dysuria, frequency and urgency.       Incontinent of urine.   Musculoskeletal:  Positive for arthralgias, back pain, gait problem and myalgias.       Right lower back hip pain, travels down to the right leg. Left groin/hip region pain with movement, able to walk if Tramadol use.   Skin:  Negative for color change.  Neurological:   Negative for speech difficulty, weakness and headaches.  Psychiatric/Behavioral:  Negative for confusion and sleep disturbance. The patient is not nervous/anxious.     Immunization History  Administered Date(s) Administered   Fluad Quad(high Dose 65+) 09/30/2020   Influenza, High Dose Seasonal PF 08/16/2018, 09/08/2019, 09/24/2020, 10/04/2021   Influenza,inj,Quad PF,6+ Mos 08/11/2016   Influenza-Unspecified 11/12/2017, 10/05/2022   Moderna Covid-19 Vaccine Bivalent Booster 43yrs & up 10/04/2021   Moderna Sars-Covid-2 Vaccination 12/15/2019, 01/12/2020, 10/19/2020   PNEUMOCOCCAL CONJUGATE-20 10/04/2021   PPD Test 03/01/2018   Pneumococcal Conjugate-13 06/26/2017   Pneumococcal Polysaccharide-23 12/11/2009   Zoster Recombinant(Shingrix) 01/28/2019, 09/02/2019   Pertinent  Health Maintenance Due  Topic Date Due   INFLUENZA VACCINE  07/12/2023   DEXA SCAN  Completed      01/31/2023    1:08 PM 02/19/2023    2:29 PM 03/06/2023   11:57 AM 03/13/2023   10:48 AM 03/22/2023   10:53 AM  Fall Risk  Falls in the past year? 0 1 0 0 0  Was there an injury with Fall? 0 1 0 0 0  Fall Risk Category Calculator 0 3 0 0 0  Patient at Risk for Falls Due to Impaired balance/gait;Impaired mobility History of fall(s);Impaired balance/gait History of fall(s);Impaired balance/gait History of fall(s);Impaired balance/gait History of fall(s);Impaired balance/gait  Fall risk Follow up Falls evaluation completed Falls evaluation completed Falls evaluation completed Falls evaluation completed Falls evaluation completed   Functional Status Survey:    Vitals:   08/07/23 1220 08/08/23 1558  BP: (!) 145/87 (!) 145/87  Pulse: 74   Resp: 16   Temp: 97.8 F (36.6 C)   SpO2: 93%   Weight: 171 lb 9.6 oz (77.8 kg)   Height: 5\' 2"  (1.575 m)    Body mass index is 31.39 kg/m. Physical Exam Vitals and nursing note reviewed.  Constitutional:      Appearance: Normal appearance.  HENT:     Head: Normocephalic and  atraumatic.     Nose: Nose normal.     Mouth/Throat:     Mouth: Mucous membranes are moist.  Eyes:     Extraocular Movements: Extraocular movements intact.     Conjunctiva/sclera: Conjunctivae normal.     Pupils: Pupils are equal, round, and reactive to light.  Cardiovascular:     Rate and Rhythm: Normal rate and regular rhythm.     Heart sounds: No murmur heard. Pulmonary:     Effort: Pulmonary effort is normal.     Breath sounds: No rales.  Abdominal:     General: Bowel sounds are normal.     Palpations: Abdomen is soft.     Tenderness: There is no abdominal tenderness.  Musculoskeletal:        General: No tenderness.     Cervical back: Normal range of motion and neck supple.     Right lower leg: Edema present.     Left lower leg: Edema present.     Comments: 1+ edema BLE, L>R  Skin:    General: Skin is warm and dry.     Comments: Lots of moles. Chronic erythema venous insufficiency skin changes BLE  Neurological:     General: No focal deficit present.     Mental Status: She is alert and oriented to person, place, and time. Mental status is at baseline.     Gait: Gait abnormal.  Psychiatric:        Mood and Affect: Mood normal.        Behavior: Behavior normal.        Thought Content: Thought content normal.        Judgment: Judgment normal.     Labs reviewed: Recent Labs    02/12/23 1120 02/13/23 0435 02/14/23 0420 02/20/23 0000 03/27/23 0000  NA 139 140 141 142 141  K 4.1 3.4* 3.2* 4.0 4.3  CL 99 104 110 106 104  CO2 27 25 23  26* 30*  GLUCOSE 125* 104* 174*  --   --   BUN 17 14 20  32* 13  CREATININE 1.09* 0.88 0.85 1.0 1.0  CALCIUM 9.4 9.2 9.1 9.1  --    Recent Labs    10/27/22 0945 10/28/22 0357 02/12/23 1120 02/20/23 0000  AST 25 19 26  12*  ALT 17 14 15 16   ALKPHOS 61 47 61 53  BILITOT 1.4* 1.1 1.4*  --   PROT 7.6 6.3* 7.9  --   ALBUMIN 3.7 2.7* 3.8 3.5   Recent Labs    10/29/22 0343 10/30/22 1216 02/12/23 1120 02/13/23 0435  02/14/23 0420 02/20/23 0000 06/12/23 0000  WBC 13.6*   < > 15.7* 12.4* 9.1 12.0 8.9  NEUTROABS 10.1*  --  10.8*  --   --   --  3,462.00  HGB 9.8*   < > 13.7 11.4* 10.5* 12.5 11.1*  HCT 32.8*   < > 44.7 38.5 34.8* 38 35*  MCV 99.7   < > 96.5 97.2 95.1  --   --   PLT 221   < > 278 250 251 345 265   < > = values in this interval not displayed.   Lab Results  Component Value Date   TSH 0.865 02/27/2022   Lab Results  Component Value Date   HGBA1C 6.7 (H) 02/15/2023   Lab Results  Component Value Date   CHOL 151 06/05/2023   HDL 47 06/05/2023   LDLCALC 77 06/05/2023   TRIG 174 (A) 06/05/2023   CHOLHDL 2.6 04/06/2020    Significant Diagnostic Results in last 30 days:  No results found.  Assessment/Plan  Chronic diastolic heart failure (HCC) DOE, on and off wheezing, cough, occasional yellow phlegm in am. CXR no evidence of acute infection 10/2022. EF 60-65% 05/22/22, changed to Torsemide, on Amlodipine  GERD (gastroesophageal reflux disease)  off acid reducer. 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 Diclofenac, MRI completed, Neurosurgery consulted, not a good surgical candidate.   Steroid-induced diabetes (HCC)  Hgb a1c 6.7 02/15/23, prediabetic, on Steroids, monitor CBG  Hypokalemia K 3.4 03/20/23, on Kcl  Asthma Epinephrine prn, Allegra, Flonase,  Singulair  Rheumatoid arthritis (HCC) RA/fibromyalgia, taking steroids, and Tramadol, TSH 0.865 02/27/22  Age-related osteoporosis without current pathological fracture off Alendronate, on Vit D, t score -1.8 06/06/21  Gout R ankle, stable, on Colchicine  History of DVT (deep vein thrombosis)  Hx of DVT, venous US BLE was negative for DVT while in hospital. Off Eliquis.   HTN (hypertension) Blood pressure is controlled, continue Amlodipine.    Family/ staff  Communication: plan of care reviewed with the patient and charge nurse.   Labs/tests ordered:  none  Time spend 40 minutes.

## 2023-08-07 NOTE — Assessment & Plan Note (Signed)
Hx of DVT, venous US BLE was negative for DVT while in hospital. Off Eliquis.  

## 2023-08-07 NOTE — Assessment & Plan Note (Signed)
K 3.4 03/20/23, on Kcl

## 2023-08-07 NOTE — Assessment & Plan Note (Signed)
Hgb a1c 6.7 02/15/23, prediabetic, on Steroids, monitor CBG

## 2023-08-07 NOTE — Assessment & Plan Note (Signed)
RA/fibromyalgia, taking steroids, and Tramadol, TSH 0.865 02/27/22 

## 2023-08-07 NOTE — Assessment & Plan Note (Signed)
Blood pressure is controlled, continue Amlodipine.  

## 2023-08-07 NOTE — Assessment & Plan Note (Signed)
Epinephrine prn, Allegra, Flonase, Singulair 

## 2023-08-07 NOTE — Assessment & Plan Note (Signed)
off Alendronate, on Vit D, t score -1.8 06/06/21

## 2023-08-07 NOTE — Assessment & Plan Note (Signed)
off acid reducer. Hgb 12.5 02/20/23

## 2023-08-07 NOTE — Assessment & Plan Note (Signed)
DOE, on and off wheezing, cough, occasional yellow phlegm in am. CXR no evidence of acute infection 10/2022. EF 60-65% 05/22/22, changed to Torsemide, on Amlodipine

## 2023-08-07 NOTE — Assessment & Plan Note (Signed)
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 Diclofenac, MRI completed, Neurosurgery consulted, not a good surgical candidate.

## 2023-08-07 NOTE — Assessment & Plan Note (Signed)
R ankle, stable, on Colchicine

## 2023-08-08 ENCOUNTER — Encounter: Payer: Self-pay | Admitting: Nurse Practitioner

## 2023-08-14 DIAGNOSIS — M62522 Muscle wasting and atrophy, not elsewhere classified, left upper arm: Secondary | ICD-10-CM | POA: Diagnosis not present

## 2023-08-14 DIAGNOSIS — M62521 Muscle wasting and atrophy, not elsewhere classified, right upper arm: Secondary | ICD-10-CM | POA: Diagnosis not present

## 2023-08-14 DIAGNOSIS — R41841 Cognitive communication deficit: Secondary | ICD-10-CM | POA: Diagnosis not present

## 2023-08-14 DIAGNOSIS — M6281 Muscle weakness (generalized): Secondary | ICD-10-CM | POA: Diagnosis not present

## 2023-08-21 DIAGNOSIS — R41841 Cognitive communication deficit: Secondary | ICD-10-CM | POA: Diagnosis not present

## 2023-08-21 DIAGNOSIS — M6281 Muscle weakness (generalized): Secondary | ICD-10-CM | POA: Diagnosis not present

## 2023-08-21 DIAGNOSIS — M62521 Muscle wasting and atrophy, not elsewhere classified, right upper arm: Secondary | ICD-10-CM | POA: Diagnosis not present

## 2023-08-21 DIAGNOSIS — M62522 Muscle wasting and atrophy, not elsewhere classified, left upper arm: Secondary | ICD-10-CM | POA: Diagnosis not present

## 2023-08-24 DIAGNOSIS — M62522 Muscle wasting and atrophy, not elsewhere classified, left upper arm: Secondary | ICD-10-CM | POA: Diagnosis not present

## 2023-08-24 DIAGNOSIS — M6281 Muscle weakness (generalized): Secondary | ICD-10-CM | POA: Diagnosis not present

## 2023-08-24 DIAGNOSIS — R41841 Cognitive communication deficit: Secondary | ICD-10-CM | POA: Diagnosis not present

## 2023-08-24 DIAGNOSIS — M62521 Muscle wasting and atrophy, not elsewhere classified, right upper arm: Secondary | ICD-10-CM | POA: Diagnosis not present

## 2023-08-27 DIAGNOSIS — M6281 Muscle weakness (generalized): Secondary | ICD-10-CM | POA: Diagnosis not present

## 2023-08-27 DIAGNOSIS — M62522 Muscle wasting and atrophy, not elsewhere classified, left upper arm: Secondary | ICD-10-CM | POA: Diagnosis not present

## 2023-08-27 DIAGNOSIS — R41841 Cognitive communication deficit: Secondary | ICD-10-CM | POA: Diagnosis not present

## 2023-08-27 DIAGNOSIS — M62521 Muscle wasting and atrophy, not elsewhere classified, right upper arm: Secondary | ICD-10-CM | POA: Diagnosis not present

## 2023-08-28 DIAGNOSIS — N3020 Other chronic cystitis without hematuria: Secondary | ICD-10-CM | POA: Diagnosis not present

## 2023-08-29 ENCOUNTER — Encounter: Payer: Self-pay | Admitting: Nurse Practitioner

## 2023-08-29 NOTE — Progress Notes (Signed)
This encounter was created in error - please disregard.

## 2023-08-30 DIAGNOSIS — M62521 Muscle wasting and atrophy, not elsewhere classified, right upper arm: Secondary | ICD-10-CM | POA: Diagnosis not present

## 2023-08-30 DIAGNOSIS — R41841 Cognitive communication deficit: Secondary | ICD-10-CM | POA: Diagnosis not present

## 2023-08-30 DIAGNOSIS — M62522 Muscle wasting and atrophy, not elsewhere classified, left upper arm: Secondary | ICD-10-CM | POA: Diagnosis not present

## 2023-08-30 DIAGNOSIS — M6281 Muscle weakness (generalized): Secondary | ICD-10-CM | POA: Diagnosis not present

## 2023-09-03 DIAGNOSIS — R41841 Cognitive communication deficit: Secondary | ICD-10-CM | POA: Diagnosis not present

## 2023-09-03 DIAGNOSIS — M62522 Muscle wasting and atrophy, not elsewhere classified, left upper arm: Secondary | ICD-10-CM | POA: Diagnosis not present

## 2023-09-03 DIAGNOSIS — M62521 Muscle wasting and atrophy, not elsewhere classified, right upper arm: Secondary | ICD-10-CM | POA: Diagnosis not present

## 2023-09-03 DIAGNOSIS — M6281 Muscle weakness (generalized): Secondary | ICD-10-CM | POA: Diagnosis not present

## 2023-09-06 ENCOUNTER — Non-Acute Institutional Stay: Payer: Medicare PPO | Admitting: Nurse Practitioner

## 2023-09-06 ENCOUNTER — Encounter: Payer: Self-pay | Admitting: Nurse Practitioner

## 2023-09-06 DIAGNOSIS — I10 Essential (primary) hypertension: Secondary | ICD-10-CM

## 2023-09-06 DIAGNOSIS — N949 Unspecified condition associated with female genital organs and menstrual cycle: Secondary | ICD-10-CM

## 2023-09-06 DIAGNOSIS — R41841 Cognitive communication deficit: Secondary | ICD-10-CM | POA: Diagnosis not present

## 2023-09-06 DIAGNOSIS — M5116 Intervertebral disc disorders with radiculopathy, lumbar region: Secondary | ICD-10-CM

## 2023-09-06 DIAGNOSIS — E099 Drug or chemical induced diabetes mellitus without complications: Secondary | ICD-10-CM | POA: Diagnosis not present

## 2023-09-06 DIAGNOSIS — I5032 Chronic diastolic (congestive) heart failure: Secondary | ICD-10-CM | POA: Diagnosis not present

## 2023-09-06 DIAGNOSIS — M8580 Other specified disorders of bone density and structure, unspecified site: Secondary | ICD-10-CM

## 2023-09-06 DIAGNOSIS — M0579 Rheumatoid arthritis with rheumatoid factor of multiple sites without organ or systems involvement: Secondary | ICD-10-CM | POA: Diagnosis not present

## 2023-09-06 DIAGNOSIS — E782 Mixed hyperlipidemia: Secondary | ICD-10-CM

## 2023-09-06 DIAGNOSIS — E538 Deficiency of other specified B group vitamins: Secondary | ICD-10-CM

## 2023-09-06 DIAGNOSIS — T148XXA Other injury of unspecified body region, initial encounter: Secondary | ICD-10-CM | POA: Diagnosis not present

## 2023-09-06 DIAGNOSIS — M109 Gout, unspecified: Secondary | ICD-10-CM

## 2023-09-06 DIAGNOSIS — I872 Venous insufficiency (chronic) (peripheral): Secondary | ICD-10-CM

## 2023-09-06 DIAGNOSIS — M62521 Muscle wasting and atrophy, not elsewhere classified, right upper arm: Secondary | ICD-10-CM | POA: Diagnosis not present

## 2023-09-06 DIAGNOSIS — R000 Tachycardia, unspecified: Secondary | ICD-10-CM | POA: Diagnosis not present

## 2023-09-06 DIAGNOSIS — K5720 Diverticulitis of large intestine with perforation and abscess without bleeding: Secondary | ICD-10-CM

## 2023-09-06 DIAGNOSIS — E876 Hypokalemia: Secondary | ICD-10-CM | POA: Diagnosis not present

## 2023-09-06 DIAGNOSIS — Z86718 Personal history of other venous thrombosis and embolism: Secondary | ICD-10-CM

## 2023-09-06 DIAGNOSIS — M62522 Muscle wasting and atrophy, not elsewhere classified, left upper arm: Secondary | ICD-10-CM | POA: Diagnosis not present

## 2023-09-06 DIAGNOSIS — J45909 Unspecified asthma, uncomplicated: Secondary | ICD-10-CM

## 2023-09-06 DIAGNOSIS — K219 Gastro-esophageal reflux disease without esophagitis: Secondary | ICD-10-CM

## 2023-09-06 DIAGNOSIS — M6281 Muscle weakness (generalized): Secondary | ICD-10-CM | POA: Diagnosis not present

## 2023-09-06 DIAGNOSIS — T380X5D Adverse effect of glucocorticoids and synthetic analogues, subsequent encounter: Secondary | ICD-10-CM

## 2023-09-06 NOTE — Assessment & Plan Note (Signed)
Gout, R ankle, stable, on Colchicine

## 2023-09-06 NOTE — Assessment & Plan Note (Signed)
CT left adnexa cyst 11/2020, MRI 05/08/21,  05/22/22 renal US shoed No evidence of renal mass or hydronephrosis.

## 2023-09-06 NOTE — Assessment & Plan Note (Signed)
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 Diclofenac, MRI completed, Neurosurgery consulted, not a good surgical candidate.

## 2023-09-06 NOTE — Assessment & Plan Note (Signed)
Hgb a1c 6.7 02/15/23, prediabetic, on Steroids, monitor CBG

## 2023-09-06 NOTE — Assessment & Plan Note (Signed)
Blood pressure is controlled,  on Amlodipine.

## 2023-09-06 NOTE — Assessment & Plan Note (Signed)
RA/fibromyalgia, taking steroids, and Tramadol, TSH 0.865 02/27/22, aches and pains complained, f/u Rheumatology.

## 2023-09-06 NOTE — Assessment & Plan Note (Signed)
K 4.3 03/27/23 on Kcl

## 2023-09-06 NOTE — Assessment & Plan Note (Signed)
takes Atorvastatin, LFL 77 06/05/23

## 2023-09-06 NOTE — Assessment & Plan Note (Signed)
Hx of DVT, venous US BLE was negative for DVT while in hospital. Off Eliquis.

## 2023-09-06 NOTE — Assessment & Plan Note (Signed)
right intergluteal cleft scabbed area, painful, no open area or s/s of infection. Avoid pressure and moist, apply hydrocolloid dressing, observe.

## 2023-09-06 NOTE — Assessment & Plan Note (Signed)
OP Rheumatology, off Alendronate, on Vit D, t score -1.8 06/06/21

## 2023-09-06 NOTE — Assessment & Plan Note (Signed)
Asthma Epinephrine prn, Allegra, Flonase, Singulair

## 2023-09-06 NOTE — Assessment & Plan Note (Signed)
Hx of sinus tachycardia, heart rate is in control.

## 2023-09-06 NOTE — Assessment & Plan Note (Signed)
Hx of aute sigmoid diverticulitis with pneumoperitoneum, f/u Dr. Sheliah Hatch, diarrhea on and off, negative C-diff while in hospital.

## 2023-09-06 NOTE — Assessment & Plan Note (Signed)
Vit B12 deficiency, on Vit B12 po, Vit B12 235 06/20/21

## 2023-09-06 NOTE — Assessment & Plan Note (Signed)
Venous insufficiency/Edema , takes Torsemide, Kcl.

## 2023-09-06 NOTE — Assessment & Plan Note (Signed)
Chronic diastolic heart failure, DOE, on and off wheezing, cough, occasional yellow phlegm in am. CXR no evidence of acute infection 10/2022. EF 60-65% 05/22/22, changed to Torsemide, on Amlodipine

## 2023-09-06 NOTE — Assessment & Plan Note (Signed)
off acid reducer. Hgb 11.1 06/12/23

## 2023-09-06 NOTE — Progress Notes (Signed)
Location:  [facility] Room Number: AL 925-A Place of Service:  ALF (13) Provider:  Sharonda Llamas X, NP  Patient Care Team: Andree Heeg X, [name]  Extended Emergency Contact Information Primary Emergency Contact: [name] [place] of [place] Home Phone: [phone] Mobile Phone: [phone] Relation: Daughter  Code Status:  Full Code Goals of care: Advanced Directive information    09/06/2023    2:28 PM  Advanced Directives  Does Patient Have a Medical Advance Directive? Yes  Type of Advance Directive Healthcare Power of Attorney  Does patient want to make changes to medical advance directive? No - Patient declined  Copy of Healthcare Power of Attorney in Chart? Yes - validated most recent copy scanned in chart (See row information)     Chief Complaint  Patient presents with   Acute Visit    Pressure ulcer on right buttocks     HPI:  Pt is a 89 y.o. female seen today for right intergluteal cleft scabbed area, painful, no open area or s/s of infection.   Chronic diastolic heart failure, DOE, on and off wheezing, cough, occasional yellow phlegm in am. CXR no evidence of acute infection 10/2022. 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 Diclofenac, MRI completed, Neurosurgery consulted, not a good surgical candidate.                           Hgb a1c 6.7 02/15/23, prediabetic, on Steroids, monitor CBG             The right middle finger stiffness, difficulty eating.  Hypokalemia, K 4.3 03/27/23 on Kcl             Hx of sinus tachycardia, heart rate is in control.              Hx of aute sigmoid diverticulitis with pneumoperitoneum, f/u Dr. Sheliah Hatch, diarrhea on and off, negative C-diff while in hospital.              Asthma Epinephrine prn, Allegra, Flonase, Singulair              RA/fibromyalgia, taking steroids, and Tramadol, TSH 0.865 02/27/22             GERD off acid reducer. Hgb 11.1 06/12/23             Venous insufficiency/Edema , takes Torsemide, Kcl.             OP Rheumatology, off Alendronate, on Vit D, t score -1.8 06/06/21             Hyperlipidemia, takes Atorvastatin, LFL 77 06/05/23             Vit B12 deficiency, on Vit B12 po, Vit B12 235 06/20/21             CT left adnexa cyst 11/2020, MRI 05/08/21,  05/22/22 renal US shoed No evidence of renal mass or hydronephrosis.             Gout, R ankle, stable, on Colchicine             Hx of DVT, venous US BLE was negative for DVT while in hospital. Off Eliquis.              HTN, on Amlodipine.        Past  Medical History:  Diagnosis Date   Adrenal failure (HCC)    Arthritis    Asthma    Cancer (HCC)    Cataract    Closed nondisplaced fracture of fifth right metatarsal bone 09/18/2017   Diverticulitis    Per patient   E coli bacteremia    Fibromyalgia 2008   GERD (gastroesophageal reflux disease) 12/18/2016   HCAP (healthcare-associated pneumonia) 02/03/2018   Hyperlipidemia 10/13/2021   Osteoporosis    RA (rheumatoid arthritis) (HCC)    Recurrent upper respiratory infection (URI)    Sepsis due to urinary tract infection (HCC) 01/18/2018   Urticaria    Past Surgical History:  Procedure Laterality Date   BACK SURGERY     BILATERAL CARPAL TUNNEL RELEASE  2005   right and left   CATARACT EXTRACTION, BILATERAL  2004   right and left   CERVICAL FUSION  2011,2010,2008   2 disks   HEEL SPUR SURGERY  2004   lower back fusion  2011   Fusion of 3-4 and 4-5 lower back   RADIOFREQUENCY ABLATION  2020   ROTATOR CUFF REPAIR  [id number]   SQUAMOUS CELL CARCINOMA EXCISION     TONSILLECTOMY AND ADENOIDECTOMY  1947   TOTAL SHOULDER ARTHROPLASTY      Allergies  Allergen Reactions   Adhesive [Tape] Rash   Celebrex [Celecoxib] Hives   Ciprofibrate Nausea Only   Cymbalta [Duloxetine Hcl] Swelling    Gabitril [Tiagabine] Swelling   Lyrica [Pregabalin] Swelling   Neurontin [Gabapentin] Swelling   Nexium [Esomeprazole] Rash   Nsaids Rash   Shrimp [Shellfish Allergy] Anaphylaxis    Per patient "shrimp only"   Azactam [Aztreonam]     Hand swelling    Azelastine Hcl     Rash    Ciprofloxacin Other (See Comments)    dizziness   Claritin [Loratadine]     Irritability Nervousness    Methotrexate Derivatives    Nasacort [Triamcinolone]     Dizzy    Olopatadine Other (See Comments)    Pain and lethargy    Other    Sulfamethizole Other (See Comments)    unknown   Zantac [Ranitidine Hcl] Other (See Comments)    unknown   Claritin-D 12 Hour [Loratadine-Pseudoephedrine Er] Anxiety   Keflex [Cephalexin] Nausea And Vomiting    Tolerated Ancef   Penicillins Rash    Injection site reaction. Tolerated cefepime in past. Also reports tolerating a penicillin infusion after this initial rxn ~20 yrs ago.  Has patient had a PCN reaction causing immediate rash, facial/tongue/throat swelling, SOB or lightheadedness with hypotension: No Has patient had a PCN reaction causing severe rash involving mucus membranes or skin necrosis: No Has patient had a PCN reaction that required hospitalization No Has patient had a PCN reaction occurring within the last 10 years: No     Sulfa Antibiotics Nausea And Vomiting    Outpatient Encounter Medications as of 09/06/2023  Medication Sig   acetaminophen (TYLENOL) 325 MG tablet Take 650 mg by mouth every 4 (four) hours as needed.   amLODipine (NORVASC) 5 MG tablet Take 5 mg by mouth daily.   atorvastatin (LIPITOR) 20 MG tablet TAKE 1 TABLET BY MOUTH EVERY DAY   Biotin 5 MG TBDP Take 1 tablet (5 mg total) by mouth daily.   Cholecalciferol (VITAMIN D3) 125 MCG (5000 UT) CAPS TAKE 1 CAPSULE BY MOUTH EVERY DAY   colchicine 0.6 MG tablet TAKE 1 TABLET BY MOUTH EVERY DAY   CRANBERRY PO Take 1 capsule by  mouth 2 (two) times daily.   cycloSPORINE (RESTASIS) 0.05 %  ophthalmic emulsion Place 1 drop into both eyes 2 (two) times daily.   D-Mannose 500 MG CAPS Take 1 tablet by mouth daily.   diclofenac Sodium (VOLTAREN) 1 % GEL APPLY 2 TO 4 GRAMS TOPICALLY TO AFFECTED JOINTS UP TO 4 TIMES DAILY   fexofenadine (ALLEGRA) 180 MG tablet Take 1 tablet (180 mg total) by mouth daily.   ipratropium-albuterol (DUONEB) 0.5-2.5 (3) MG/3ML SOLN Take 3 mLs by nebulization as needed.   lansoprazole (PREVACID) 30 MG capsule TAKE 1 CAPSULE BY MOUTH ONCE DAILY AT NOON   montelukast (SINGULAIR) 10 MG tablet TAKE 1 TABLET BY MOUTH EVERYDAY AT BEDTIME   potassium chloride SA (KLOR-CON M) 20 MEQ tablet Take 40 mEq by mouth 2 (two) times daily.   predniSONE (DELTASONE) 5 MG tablet Take 5 mg by mouth daily with breakfast.   saccharomyces boulardii (FLORASTOR) 250 MG capsule Take 250 mg by mouth 2 (two) times daily.   Torsemide 40 MG TABS Take 1 tablet by mouth 2 (two) times daily.   traMADol (ULTRAM) 50 MG tablet Take 1 tablet (50 mg total) by mouth every 6 (six) hours as needed (pain).   No facility-administered encounter medications on file as of 09/06/2023.    Review of Systems  Constitutional:  Negative for appetite change, fatigue and fever.  HENT:  Negative for congestion, sinus pressure and trouble swallowing.   Eyes:  Negative for visual disturbance.  Respiratory:  Positive for shortness of breath. Negative for cough, chest tightness and wheezing.        DOE, occasional yellow phlegm in am, hacking cough.   Cardiovascular:  Positive for leg swelling.  Gastrointestinal:  Negative for abdominal pain and constipation.       On and off diarrhea.   Genitourinary:  Negative for dysuria, frequency and urgency.       Incontinent of urine.   Musculoskeletal:  Positive for arthralgias, back pain, gait problem and myalgias.       Right lower back hip pain, travels down to the right leg. Left groin/hip region pain with movement, able to walk if Tramadol use.   Skin:  Negative for  color change.       Right intergluteal cleft scabbed over area.   Neurological:  Negative for speech difficulty, weakness and headaches.  Psychiatric/Behavioral:  Negative for confusion and sleep disturbance. The patient is not nervous/anxious.     Immunization History  Administered Date(s) Administered   Fluad Quad(high Dose 65+) 09/30/2020   Influenza, High Dose Seasonal PF 08/16/2018, 09/08/2019, 09/24/2020, 10/04/2021   Influenza,inj,Quad PF,6+ Mos 08/11/2016   Influenza-Unspecified 11/12/2017, 10/05/2022   Moderna Covid-19 Vaccine Bivalent Booster 36yrs & up 10/04/2021   Moderna Sars-Covid-2 Vaccination 12/15/2019, 01/12/2020, 10/19/2020   PNEUMOCOCCAL CONJUGATE-20 10/04/2021   PPD Test 03/01/2018   Pneumococcal Conjugate-13 06/26/2017   Pneumococcal Polysaccharide-23 12/11/2009   Tdap 04/04/2023   Zoster Recombinant(Shingrix) 01/28/2019, 09/02/2019   Pertinent  Health Maintenance Due  Topic Date Due   INFLUENZA VACCINE  07/12/2023   DEXA SCAN  Completed      01/31/2023    1:08 PM 02/19/2023    2:29 PM 03/06/2023   11:57 AM 03/13/2023   10:48 AM 03/22/2023   10:53 AM  Fall Risk  Falls in the past year? 0 1 0 0 0  Was there an injury with Fall? 0 1 0 0 0  Fall Risk Category Calculator 0 3 0 0 0  Patient at  Risk for Falls Due to Impaired balance/gait;Impaired mobility History of fall(s);Impaired balance/gait History of fall(s);Impaired balance/gait History of fall(s);Impaired balance/gait History of fall(s);Impaired balance/gait  Fall risk Follow up Falls evaluation completed Falls evaluation completed Falls evaluation completed Falls evaluation completed Falls evaluation completed   Functional Status Survey:    Vitals:   09/06/23 1427  BP: (!) 155/71  Pulse: 74  Resp: 18  Temp: 97.7 F (36.5 C)  SpO2: 96%  Weight: 174 lb (78.9 kg)  Height: 5\' 2"  (1.575 m)   Body mass index is 31.83 kg/m. Physical Exam Vitals and nursing note reviewed.  Constitutional:       Appearance: Normal appearance.  HENT:     Head: Normocephalic and atraumatic.     Nose: Nose normal.     Mouth/Throat:     Mouth: Mucous membranes are moist.  Eyes:     Extraocular Movements: Extraocular movements intact.     Conjunctiva/sclera: Conjunctivae normal.     Pupils: Pupils are equal, round, and reactive to light.  Cardiovascular:     Rate and Rhythm: Normal rate and regular rhythm.     Heart sounds: No murmur heard. Pulmonary:     Effort: Pulmonary effort is normal.     Breath sounds: No rales.  Abdominal:     General: Bowel sounds are normal.     Palpations: Abdomen is soft.     Tenderness: There is no abdominal tenderness.  Musculoskeletal:        General: No tenderness.     Cervical back: Normal range of motion and neck supple.     Right lower leg: Edema present.     Left lower leg: Edema present.     Comments: 1+ edema BLE, L>R  Skin:    General: Skin is warm and dry.     Comments: Lots of moles. Chronic erythema venous insufficiency skin changes BLE. Scabbed area right intergluteal cleft.   Neurological:     General: No focal deficit present.     Mental Status: She is alert and oriented to person, place, and time. Mental status is at baseline.     Gait: Gait abnormal.  Psychiatric:        Mood and Affect: Mood normal.        Behavior: Behavior normal.        Thought Content: Thought content normal.        Judgment: Judgment normal.     Labs reviewed: Recent Labs    02/12/23 1120 02/13/23 0435 02/14/23 0420 02/20/23 0000 03/27/23 0000  NA 139 140 141 142 141  K 4.1 3.4* 3.2* 4.0 4.3  CL 99 104 110 106 104  CO2 27 25 23  26* 30*  GLUCOSE 125* 104* 174*  --   --   BUN 17 14 20  32* 13  CREATININE 1.09* 0.88 0.85 1.0 1.0  CALCIUM 9.4 9.2 9.1 9.1  --    Recent Labs    10/27/22 0945 10/28/22 0357 02/12/23 1120 02/20/23 0000  AST 25 19 26  12*  ALT 17 14 15 16   ALKPHOS 61 47 61 53  BILITOT 1.4* 1.1 1.4*  --   PROT 7.6 6.3* 7.9  --   ALBUMIN  3.7 2.7* 3.8 3.5   Recent Labs    10/29/22 0343 10/30/22 1216 02/12/23 1120 02/13/23 0435 02/14/23 0420 02/20/23 0000 06/12/23 0000  WBC 13.6*   < > 15.7* 12.4* 9.1 12.0 8.9  NEUTROABS 10.1*  --  10.8*  --   --   --  3,462.00  HGB 9.8*   < > 13.7 11.4* 10.5* 12.5 11.1*  HCT 32.8*   < > 44.7 38.5 34.8* 38 35*  MCV 99.7   < > 96.5 97.2 95.1  --   --   PLT 221   < > 278 250 251 345 265   < > = values in this interval not displayed.   Lab Results  Component Value Date   TSH 0.865 02/27/2022   Lab Results  Component Value Date   HGBA1C 6.7 (H) 02/15/2023   Lab Results  Component Value Date   CHOL 151 06/05/2023   HDL 47 06/05/2023   LDLCALC 77 06/05/2023   TRIG 174 (A) 06/05/2023   CHOLHDL 2.6 04/06/2020    Significant Diagnostic Results in last 30 days:  No results found.  Assessment/Plan Rheumatoid arthritis (HCC)  RA/fibromyalgia, taking steroids, and Tramadol, TSH 0.865 02/27/22, aches and pains complained, f/u Rheumatology.   GERD (gastroesophageal reflux disease) off acid reducer. Hgb 11.1 06/12/23  Deep tissue injury  right intergluteal cleft scabbed area, painful, no open area or s/s of infection. Avoid pressure and moist, apply hydrocolloid dressing, observe.   Chronic diastolic heart failure (HCC) Chronic diastolic heart failure, DOE, on and off wheezing, cough, occasional yellow phlegm in am. CXR no evidence of acute infection 10/2022. 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 Diclofenac, MRI completed, Neurosurgery consulted, not a good surgical candidate.   Steroid-induced diabetes (HCC) Hgb a1c 6.7 02/15/23, prediabetic, on Steroids, monitor CBG  Hypokalemia  K 4.3 03/27/23 on Kcl  Sinus tachycardia Hx of sinus tachycardia, heart rate is in control.    Perforation of sigmoid colon due to diverticulitis Hx of aute sigmoid diverticulitis with pneumoperitoneum, f/u Dr. Sheliah Hatch, diarrhea on and off, negative C-diff while in hospital.   Asthma Asthma Epinephrine prn, Allegra, Flonase, Singulair  Edema of both lower extremities due to peripheral venous insufficiency Venous insufficiency/Edema , takes Torsemide, Kcl.  Osteopenia OP Rheumatology, off Alendronate, on Vit D, t score -1.8 06/06/21  Hyperlipidemia takes Atorvastatin, LFL 77 06/05/23  B12 deficiency Vit B12 deficiency, on Vit B12 po, Vit B12 235 06/20/21  Adnexal cyst CT left adnexa cyst 11/2020, MRI 05/08/21,  05/22/22 renal US shoed No evidence of renal mass or hydronephrosis.  Gout Gout, R ankle, stable, on Colchicine  History of DVT (deep vein thrombosis) Hx of DVT, venous US BLE was negative for DVT while in hospital. Off Eliquis.       Family/ staff Communication: plan of care reviewed with the patient and charge nurse.   Labs/tests ordered:  none  Time spend 40 minutes.

## 2023-09-11 DIAGNOSIS — M62522 Muscle wasting and atrophy, not elsewhere classified, left upper arm: Secondary | ICD-10-CM | POA: Diagnosis not present

## 2023-09-11 DIAGNOSIS — M62521 Muscle wasting and atrophy, not elsewhere classified, right upper arm: Secondary | ICD-10-CM | POA: Diagnosis not present

## 2023-09-11 DIAGNOSIS — M6281 Muscle weakness (generalized): Secondary | ICD-10-CM | POA: Diagnosis not present

## 2023-09-13 DIAGNOSIS — M6281 Muscle weakness (generalized): Secondary | ICD-10-CM | POA: Diagnosis not present

## 2023-09-13 DIAGNOSIS — M62522 Muscle wasting and atrophy, not elsewhere classified, left upper arm: Secondary | ICD-10-CM | POA: Diagnosis not present

## 2023-09-13 DIAGNOSIS — M62521 Muscle wasting and atrophy, not elsewhere classified, right upper arm: Secondary | ICD-10-CM | POA: Diagnosis not present

## 2023-09-17 DIAGNOSIS — M62522 Muscle wasting and atrophy, not elsewhere classified, left upper arm: Secondary | ICD-10-CM | POA: Diagnosis not present

## 2023-09-17 DIAGNOSIS — M62521 Muscle wasting and atrophy, not elsewhere classified, right upper arm: Secondary | ICD-10-CM | POA: Diagnosis not present

## 2023-09-17 DIAGNOSIS — M6281 Muscle weakness (generalized): Secondary | ICD-10-CM | POA: Diagnosis not present

## 2023-09-18 ENCOUNTER — Other Ambulatory Visit: Payer: Self-pay | Admitting: Adult Health

## 2023-09-18 MED ORDER — TRAMADOL HCL 50 MG PO TABS: 50.0000 mg | ORAL_TABLET | Freq: Four times a day (QID) | ORAL | 0 refills | Status: DC | PRN

## 2023-09-19 ENCOUNTER — Encounter: Payer: Self-pay | Admitting: Adult Health

## 2023-09-19 ENCOUNTER — Non-Acute Institutional Stay: Payer: Self-pay | Admitting: Adult Health

## 2023-09-19 DIAGNOSIS — B372 Candidiasis of skin and nail: Secondary | ICD-10-CM | POA: Diagnosis not present

## 2023-09-19 NOTE — Progress Notes (Signed)
Location:  [facility] Room Number: 925-A Place of Service:  ALF [phone]) Provider:  Kenard Gower, DNP, FNP-BC  [name], [name], [name]  Extended Emergency Contact Information Primary Emergency Contact: Irish Lack of [place] Home Phone: [phone] Mobile Phone: [phone] Relation: Daughter  Code Status:    Goals of care: Advanced Directive information    09/19/2023    1:54 PM  Advanced Directives  Does Patient Have a Medical Advance Directive? Yes  Type of Estate agent of [place];Living will  Does patient want to make changes to medical advance directive? No - Patient declined  Copy of Healthcare Power of Attorney in Chart? Yes - validated most recent copy scanned in chart (See row information)     Chief Complaint  Patient presents with   Acute Visit    Rash under both breast and groin    HPI:  Pt is a 89 y.o. female seen today for an acute visit regarding a rashes under bilateral breasts and groin.  She is a resident of [facility]. She has erythematous rashes under bilateral breasts and bilateral groins. She reported that the rashes are pruritic. She uses electric wheelchair when she goes to the dining hall and walker to go to the bathroom.    Past Medical History:  Diagnosis Date   Adrenal failure (HCC)    Arthritis    Asthma    Cancer (HCC)    Cataract    Closed nondisplaced fracture of fifth right metatarsal bone 09/18/2017   Diverticulitis    Per patient   E coli bacteremia    Fibromyalgia 2008   GERD (gastroesophageal reflux disease) 12/18/2016   HCAP (healthcare-associated pneumonia) 02/03/2018   Hyperlipidemia 10/13/2021   Osteoporosis    RA (rheumatoid arthritis) (HCC)    Recurrent upper respiratory infection (URI)    Sepsis due to urinary tract infection (HCC) 01/18/2018   Urticaria    Past Surgical  History:  Procedure Laterality Date   BACK SURGERY     BILATERAL CARPAL TUNNEL RELEASE  2005   right and left   CATARACT EXTRACTION, BILATERAL  2004   right and left   CERVICAL FUSION  2011,2010,2008   2 disks   HEEL SPUR SURGERY  2004   lower back fusion  2011   Fusion of 3-4 and 4-5 lower back   RADIOFREQUENCY ABLATION  2020   ROTATOR CUFF REPAIR  [id number]   SQUAMOUS CELL CARCINOMA EXCISION     TONSILLECTOMY AND ADENOIDECTOMY  1947   TOTAL SHOULDER ARTHROPLASTY      Allergies  Allergen Reactions   Adhesive [Tape] Rash   Celebrex [Celecoxib] Hives   Ciprofibrate Nausea Only   Cymbalta [Duloxetine Hcl] Swelling   Gabitril [Tiagabine] Swelling   Lyrica [Pregabalin] Swelling   Neurontin [Gabapentin] Swelling   Nexium [Esomeprazole] Rash   Nsaids Rash   Shrimp [Shellfish Allergy] Anaphylaxis    Per patient "shrimp only"   Azactam [Aztreonam]     Hand swelling    Azelastine Hcl     Rash    Ciprofloxacin Other (See Comments)    dizziness   Claritin [Loratadine]     Irritability Nervousness    Methotrexate Derivatives    Nasacort [Triamcinolone]     Dizzy    Olopatadine Other (See Comments)    Pain and lethargy    Other    Sulfamethizole Other (See Comments)    unknown  Zantac [Ranitidine Hcl] Other (See Comments)    unknown   Claritin-D 12 Hour [Loratadine-Pseudoephedrine Er] Anxiety   Keflex [Cephalexin] Nausea And Vomiting    Tolerated Ancef   Penicillins Rash    Injection site reaction. Tolerated cefepime in past. Also reports tolerating a penicillin infusion after this initial rxn ~20 yrs ago.  Has patient had a PCN reaction causing immediate rash, facial/tongue/throat swelling, SOB or lightheadedness with hypotension: No Has patient had a PCN reaction causing severe rash involving mucus membranes or skin necrosis: No Has patient had a PCN reaction that required hospitalization No Has patient had a PCN reaction occurring within the last 10 years: No      Sulfa Antibiotics Nausea And Vomiting    Outpatient Encounter Medications as of 09/19/2023  Medication Sig   acetaminophen (TYLENOL) 325 MG tablet Take 650 mg by mouth every 4 (four) hours as needed.   amLODipine (NORVASC) 5 MG tablet Take 5 mg by mouth daily.   atorvastatin (LIPITOR) 20 MG tablet TAKE 1 TABLET BY MOUTH EVERY DAY   Biotin 5 MG TBDP Take 1 tablet (5 mg total) by mouth daily.   Cholecalciferol (VITAMIN D3) 125 MCG (5000 UT) CAPS TAKE 1 CAPSULE BY MOUTH EVERY DAY   colchicine 0.6 MG tablet TAKE 1 TABLET BY MOUTH EVERY DAY   CRANBERRY PO Take 1 capsule by mouth 2 (two) times daily.   cycloSPORINE (RESTASIS) 0.05 % ophthalmic emulsion Place 1 drop into both eyes 2 (two) times daily.   D-Mannose 500 MG CAPS Take 1 tablet by mouth daily.   diclofenac Sodium (VOLTAREN) 1 % GEL APPLY 2 TO 4 GRAMS TOPICALLY TO AFFECTED JOINTS UP TO 4 TIMES DAILY   fexofenadine (ALLEGRA) 180 MG tablet Take 1 tablet (180 mg total) by mouth daily.   ipratropium-albuterol (DUONEB) 0.5-2.5 (3) MG/3ML SOLN Take 3 mLs by nebulization as needed.   lansoprazole (PREVACID) 30 MG capsule TAKE 1 CAPSULE BY MOUTH ONCE DAILY AT NOON   montelukast (SINGULAIR) 10 MG tablet TAKE 1 TABLET BY MOUTH EVERYDAY AT BEDTIME   nystatin ointment (MYCOSTATIN) 100000 UNIT/GM (Nystatin (Topical)) Apply to bi later breast and groin topically two times a day for rash for 2 Weeks   potassium chloride SA (KLOR-CON M) 20 MEQ tablet Take 40 mEq by mouth 2 (two) times daily.   predniSONE (DELTASONE) 5 MG tablet Take 5 mg by mouth daily with breakfast.   saccharomyces boulardii (FLORASTOR) 250 MG capsule Take 250 mg by mouth 2 (two) times daily.   Torsemide 40 MG TABS Take 1 tablet by mouth 2 (two) times daily.   traMADol (ULTRAM) 50 MG tablet Take 1 tablet (50 mg total) by mouth every 6 (six) hours as needed (pain).   No facility-administered encounter medications on file as of 09/19/2023.    Review of Systems  Constitutional:   Negative for appetite change, chills, fatigue and fever.  HENT:  Negative for congestion, hearing loss, rhinorrhea and sore throat.   Eyes: Negative.   Respiratory:  Negative for cough, shortness of breath and wheezing.   Cardiovascular:  Negative for chest pain, palpitations and leg swelling.  Gastrointestinal:  Negative for abdominal pain, constipation, diarrhea, nausea and vomiting.  Genitourinary:  Negative for dysuria.  Musculoskeletal:  Negative for arthralgias, back pain and myalgias.  Skin:  Positive for rash and wound. Negative for color change.  Neurological:  Negative for dizziness, weakness and headaches.  Psychiatric/Behavioral:  Negative for behavioral problems. The patient is not nervous/anxious.  Immunization History  Administered Date(s) Administered   Fluad Quad(high Dose 65+) 09/30/2020   Influenza, High Dose Seasonal PF 08/16/2018, 09/08/2019, 09/24/2020, 10/04/2021   Influenza,inj,Quad PF,6+ Mos 08/11/2016   Influenza-Unspecified 11/12/2017, 10/05/2022   Moderna Covid-19 Vaccine Bivalent Booster 62yrs & up 10/04/2021   Moderna Sars-Covid-2 Vaccination 12/15/2019, 01/12/2020, 10/19/2020   PNEUMOCOCCAL CONJUGATE-20 10/04/2021   PPD Test 03/01/2018   Pneumococcal Conjugate-13 06/26/2017   Pneumococcal Polysaccharide-23 12/11/2009   Tdap 04/04/2023   Zoster Recombinant(Shingrix) 01/28/2019, 09/02/2019   Pertinent  Health Maintenance Due  Topic Date Due   INFLUENZA VACCINE  07/12/2023   DEXA SCAN  Completed      01/31/2023    1:08 PM 02/19/2023    2:29 PM 03/06/2023   11:57 AM 03/13/2023   10:48 AM 03/22/2023   10:53 AM  Fall Risk  Falls in the past year? 0 1 0 0 0  Was there an injury with Fall? 0 1 0 0 0  Fall Risk Category Calculator 0 3 0 0 0  Patient at Risk for Falls Due to Impaired balance/gait;Impaired mobility History of fall(s);Impaired balance/gait History of fall(s);Impaired balance/gait History of fall(s);Impaired balance/gait History of  fall(s);Impaired balance/gait  Fall risk Follow up Falls evaluation completed Falls evaluation completed Falls evaluation completed Falls evaluation completed Falls evaluation completed     Vitals:   09/19/23 1345  BP: 134/84  Resp: 18  Temp: (!) 97.3 F (36.3 C)  SpO2: 95%  Weight: 165 lb (74.8 kg)  Height: 5\' 2"  (1.575 m)   Body mass index is 30.18 kg/m.  Physical Exam Constitutional:      General: She is not in acute distress.    Appearance: She is obese.  HENT:     Head: Normocephalic and atraumatic.     Nose: Nose normal.     Mouth/Throat:     Mouth: Mucous membranes are moist.  Eyes:     Conjunctiva/sclera: Conjunctivae normal.  Cardiovascular:     Rate and Rhythm: Normal rate and regular rhythm.  Pulmonary:     Effort: Pulmonary effort is normal.     Breath sounds: Normal breath sounds.  Abdominal:     General: Bowel sounds are normal.     Palpations: Abdomen is soft.  Musculoskeletal:        General: Normal range of motion.     Cervical back: Normal range of motion.  Skin:    General: Skin is warm and dry.     Findings: Rash present.     Comments: has erythematous rashes under bilateral breasts and bilateral groins. Has a small pressure ulcer on her right inner buttock  Neurological:     General: No focal deficit present.     Mental Status: She is alert and oriented to person, place, and time.  Psychiatric:        Mood and Affect: Mood normal.        Behavior: Behavior normal.        Thought Content: Thought content normal.        Judgment: Judgment normal.      Labs reviewed: Recent Labs    02/12/23 1120 02/13/23 0435 02/14/23 0420 02/20/23 0000 03/27/23 0000  NA 139 140 141 142 141  K 4.1 3.4* 3.2* 4.0 4.3  CL 99 104 110 106 104  CO2 27 25 23  26* 30*  GLUCOSE 125* 104* 174*  --   --   BUN 17 14 20  32* 13  CREATININE 1.09* 0.88 0.85 1.0 1.0  CALCIUM  9.4 9.2 9.1 9.1  --    Recent Labs    10/27/22 0945 10/28/22 0357 02/12/23 1120  02/20/23 0000  AST 25 19 26  12*  ALT 17 14 15 16   ALKPHOS 61 47 61 53  BILITOT 1.4* 1.1 1.4*  --   PROT 7.6 6.3* 7.9  --   ALBUMIN 3.7 2.7* 3.8 3.5   Recent Labs    10/29/22 0343 10/30/22 1216 02/12/23 1120 02/13/23 0435 02/14/23 0420 02/20/23 0000 06/12/23 0000  WBC 13.6*   < > 15.7* 12.4* 9.1 12.0 8.9  NEUTROABS 10.1*  --  10.8*  --   --   --  3,462.00  HGB 9.8*   < > 13.7 11.4* 10.5* 12.5 11.1*  HCT 32.8*   < > 44.7 38.5 34.8* 38 35*  MCV 99.7   < > 96.5 97.2 95.1  --   --   PLT 221   < > 278 250 251 345 265   < > = values in this interval not displayed.   Lab Results  Component Value Date   TSH 0.865 02/27/2022   Lab Results  Component Value Date   HGBA1C 6.7 (H) 02/15/2023   Lab Results  Component Value Date   CHOL 151 06/05/2023   HDL 47 06/05/2023   LDLCALC 77 06/05/2023   TRIG 174 (A) 06/05/2023   CHOLHDL 2.6 04/06/2020    Significant Diagnostic Results in last 30 days:  No results found.  Assessment/Plan  1. Candidal skin infection -  will start on nystatin ointment 100,000 unit/GM apply to rashes under bilateral breasts and bilateral groins BID -Keep area clean and dry    Family/ staff Communication: Discussed plan of care with resident and charge nurse.  Labs/tests ordered: None    Kenard Gower, DNP, MSN, FNP-BC [facility] [phone] (Monday-Friday 8:00 a.m. - 5:00 p.m.) [phone] (after hours)

## 2023-09-20 DIAGNOSIS — M62521 Muscle wasting and atrophy, not elsewhere classified, right upper arm: Secondary | ICD-10-CM | POA: Diagnosis not present

## 2023-09-20 DIAGNOSIS — M6281 Muscle weakness (generalized): Secondary | ICD-10-CM | POA: Diagnosis not present

## 2023-09-20 DIAGNOSIS — M62522 Muscle wasting and atrophy, not elsewhere classified, left upper arm: Secondary | ICD-10-CM | POA: Diagnosis not present

## 2023-09-21 ENCOUNTER — Other Ambulatory Visit: Payer: Self-pay | Admitting: Adult Health

## 2023-09-21 NOTE — Progress Notes (Deleted)
Office Visit Note  Patient: Eileen Wilkerson             Date of Birth: [birth year]           MRN: [id number]             PCP: Mast, [name], NP Referring: Mast, [name], NP Visit Date: 10/04/2023 Occupation: @GUAROCC @  Subjective:    History of Present Illness: Eileen Wilkerson is a 89 y.o. female with history of seropositive rheumatoid arthritis and osteoarthritis.  Patient is taking prednisone 10 mg daily.   Evaluated on 09/19/23 for candidal skin infection   Activities of Daily Living:  Patient reports morning stiffness for *** {minute/hour:19697}.   Patient {ACTIONS;DENIES/REPORTS:21021675::"Denies"} nocturnal pain.  Difficulty dressing/grooming: {ACTIONS;DENIES/REPORTS:21021675::"Denies"} Difficulty climbing stairs: {ACTIONS;DENIES/REPORTS:21021675::"Denies"} Difficulty getting out of chair: {ACTIONS;DENIES/REPORTS:21021675::"Denies"} Difficulty using hands for taps, buttons, cutlery, and/or writing: {ACTIONS;DENIES/REPORTS:21021675::"Denies"}  No Rheumatology ROS completed.   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   Acute metabolic encephalopathy 02/12/2023   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 02/12/2023   Urinary frequency 02/08/2023   Left hip pain 02/08/2023   Chronic diastolic heart failure (HCC) 11/06/2022   Gout 11/06/2022   Sepsis secondary to UTI (HCC) 10/27/2022   History of DVT (deep vein thrombosis) 05/26/2022   E coli bacteremia    Sepsis (HCC) 05/21/2022   Diarrhea 05/21/2022   Gait abnormality 01/26/2022   Sinus tachycardia 10/27/2021   Weight gain 10/14/2021   Hyperlipidemia 10/13/2021   Dysuria 06/23/2021   Osteopenia 06/07/2021   Adnexal cyst 03/03/2021   AK (actinic keratosis) 03/03/2021   UTI (urinary tract infection) 07/22/2020   Allergic rhinitis 07/22/2020   Asthma  07/08/2020   Perforation of sigmoid colon due to diverticulitis 06/23/2020   Hair loss 08/06/2019   SOB (shortness of breath) on exertion 08/06/2019   Pure hypercholesterolemia 08/06/2019   B12 deficiency 03/11/2018   CKD (chronic kidney disease) stage 3, GFR 30-59 ml/min (HCC) 02/14/2018   E. coli UTI 01/18/2018   Edema of both lower extremities due to peripheral venous insufficiency 09/18/2017   Bilateral lower extremity edema 06/30/2017   Physical deconditioning 05/30/2017   Immunosuppressed status (HCC) 05/30/2017   Abnormal chest x-ray 05/17/2017   Hypokalemia 04/25/2017   Fibromyalgia 12/18/2016   Spondylosis of lumbar region without myelopathy or radiculopathy 12/18/2016   Trigger finger, right middle finger 12/18/2016   Primary osteoarthritis of both feet 12/18/2016   Primary osteoarthritis of both knees 12/18/2016   GERD (gastroesophageal reflux disease) 12/18/2016   Age-related osteoporosis without current pathological fracture 12/18/2016   Rheumatoid arthritis (HCC) 12/28/2015   Long term current use of systemic steroids, for RA 12/28/2015   Lymphocytosis 11/13/2012    Past Medical History:  Diagnosis Date   Adrenal failure (HCC)    Arthritis    Asthma    Cancer (HCC)    Cataract    Closed nondisplaced fracture of fifth right metatarsal bone 09/18/2017   Diverticulitis    Per patient   E coli bacteremia    Fibromyalgia 2008   GERD (gastroesophageal reflux disease) 12/18/2016   HCAP (healthcare-associated pneumonia) 02/03/2018   Hyperlipidemia 10/13/2021   Osteoporosis    RA (rheumatoid arthritis) (HCC)    Recurrent upper respiratory infection (URI)    Sepsis due to urinary tract infection (HCC) 01/18/2018   Urticaria  Family History  Problem Relation Age of Onset   Heart attack Maternal Grandmother    Heart attack Paternal Grandfather    Breast cancer Mother 49   Diabetes Father    Heart disease Father    Congestive Heart Failure Father 58        Died from   Allergic rhinitis Neg Hx    Asthma Neg Hx    Eczema Neg Hx    Urticaria Neg Hx    Past Surgical History:  Procedure Laterality Date   BACK SURGERY     BILATERAL CARPAL TUNNEL RELEASE  2005   right and left   CATARACT EXTRACTION, BILATERAL  2004   right and left   CERVICAL FUSION  2011,2010,2008   2 disks   HEEL SPUR SURGERY  2004   lower back fusion  2011   Fusion of 3-4 and 4-5 lower back   RADIOFREQUENCY ABLATION  2020   ROTATOR CUFF REPAIR  [id number]   SQUAMOUS CELL CARCINOMA EXCISION     TONSILLECTOMY AND ADENOIDECTOMY  1947   TOTAL SHOULDER ARTHROPLASTY     Social History   Social History Narrative      Diet:        Do you drink/ eat things with caffeine? Dr. Reino Kent 2/ day      Marital status: Widowed                              What year were you married ? 1953      Do you live in a house, apartment,assistred living, condo, trailer, etc.)? Apartment      Is it one or more stories?       How many persons live in your home ? 1      Do you have any pets in your home ?(please list) No      Highest Level of education completed: PhD       Current or past profession: Paramedic, Child psychotherapist, Special Educator       Do you exercise?   No                           Type & how often       ADVANCED DIRECTIVES (Please bring copies)      Do you have a living will? Yes      Do you have a DNR form?                       If not, do you want to discuss one? Yes      Do you have signed POA?HPOA forms?                 If so, please bring to your appointment Tes      FUNCTIONAL STATUS- To be completed by Spouse / child / Staff       Do you have difficulty bathing or dressing yourself ? No      Do you have difficulty preparing food or eating ? No      Do you have difficulty managing your mediation ? No      Do you have difficulty managing your finances ? No      Do you have difficulty affording your medication ? No      Immunization History   Administered Date(s) Administered   Fluad Quad(high Dose 65+) 09/30/2020  Influenza, High Dose Seasonal PF 08/16/2018, 09/08/2019, 09/24/2020, 10/04/2021   Influenza,inj,Quad PF,6+ Mos 08/11/2016   Influenza-Unspecified 11/12/2017, 10/05/2022   Moderna Covid-19 Vaccine Bivalent Booster 70yrs & up 10/04/2021   Moderna Sars-Covid-2 Vaccination 12/15/2019, 01/12/2020, 10/19/2020   PNEUMOCOCCAL CONJUGATE-20 10/04/2021   PPD Test 03/01/2018   Pneumococcal Conjugate-13 06/26/2017   Pneumococcal Polysaccharide-23 12/11/2009   Tdap 04/04/2023   Zoster Recombinant(Shingrix) 01/28/2019, 09/02/2019     Objective: Vital Signs: There were no vitals taken for this visit.   Physical Exam Vitals and nursing note reviewed.  Constitutional:      Appearance: She is well-developed.  HENT:     Head: Normocephalic and atraumatic.  Eyes:     Conjunctiva/sclera: Conjunctivae normal.  Cardiovascular:     Rate and Rhythm: Normal rate and regular rhythm.     Heart sounds: Normal heart sounds.  Pulmonary:     Effort: Pulmonary effort is normal.     Breath sounds: Normal breath sounds.  Abdominal:     General: Bowel sounds are normal.     Palpations: Abdomen is soft.  Musculoskeletal:     Cervical back: Normal range of motion.  Lymphadenopathy:     Cervical: No cervical adenopathy.  Skin:    General: Skin is warm and dry.     Capillary Refill: Capillary refill takes less than 2 seconds.  Neurological:     Mental Status: She is alert and oriented to person, place, and time.  Psychiatric:        Behavior: Behavior normal.      Musculoskeletal Exam: ***  CDAI Exam: CDAI Score: -- Patient Global: --; Provider Global: -- Swollen: --; Tender: -- Joint Exam 10/04/2023   No joint exam has been documented for this visit   There is currently no information documented on the homunculus. Go to the Rheumatology activity and complete the homunculus joint exam.  Investigation: No additional  findings.  Imaging: No results found.  Recent Labs: Lab Results  Component Value Date   WBC 8.9 06/12/2023   HGB 11.1 (A) 06/12/2023   PLT 265 06/12/2023   NA 141 03/27/2023   K 4.3 03/27/2023   CL 104 03/27/2023   CO2 30 (A) 03/27/2023   GLUCOSE 174 (H) 02/14/2023   BUN 13 03/27/2023   CREATININE 1.0 03/27/2023   BILITOT 1.4 (H) 02/12/2023   ALKPHOS 53 02/20/2023   AST 12 (A) 02/20/2023   ALT 16 02/20/2023   PROT 7.9 02/12/2023   ALBUMIN 3.5 02/20/2023   CALCIUM 9.1 02/20/2023   GFRAA 56 (L) 02/03/2021    Speciality Comments: PLQ eye exam:10/22/18 Normal. [facility] Follow up in 6 months. Prior therapy: MTX (recurrent infections) Therapy Contraindications: Anti-TNF's due to CHF  Procedures:  No procedures performed Allergies: Adhesive [tape], Celebrex [celecoxib], Ciprofibrate, Cymbalta [duloxetine hcl], Gabitril [tiagabine], Lyrica [pregabalin], Neurontin [gabapentin], Nexium [esomeprazole], Nsaids, Shrimp [shellfish allergy], Azactam [aztreonam], Azelastine hcl, Ciprofloxacin, Claritin [loratadine], Methotrexate derivatives, Nasacort [triamcinolone], Olopatadine, Other, Sulfamethizole, Zantac [ranitidine hcl], Claritin-d 12 hour [loratadine-pseudoephedrine er], Keflex [cephalexin], Penicillins, and Sulfa antibiotics   Assessment / Plan:     Visit Diagnoses: Rheumatoid arthritis involving multiple sites with positive rheumatoid factor (HCC)  High risk medication use  Primary osteoarthritis of both hands  Primary osteoarthritis of both knees  Primary osteoarthritis of both feet  Age-related osteoporosis without current pathological fracture  Fibromyalgia  DDD (degenerative disc disease), cervical  Adrenal insufficiency (HCC)  History of CHF (congestive heart failure)  Orders: No orders of the defined types were placed in  this encounter.  No orders of the defined types were placed in this encounter.   Face-to-face time spent with patient was  *** minutes. Greater than 50% of time was spent in counseling and coordination of care.  Follow-Up Instructions: No follow-ups on file.   Gearldine Bienenstock, PA-C  Note - This record has been created using Dragon software.  Chart creation errors have been sought, but may not always  have been located. Such creation errors do not reflect on  the standard of medical care.

## 2023-09-21 NOTE — Telephone Encounter (Signed)
Assistant living resident

## 2023-09-24 DIAGNOSIS — M62521 Muscle wasting and atrophy, not elsewhere classified, right upper arm: Secondary | ICD-10-CM | POA: Diagnosis not present

## 2023-09-24 DIAGNOSIS — M62522 Muscle wasting and atrophy, not elsewhere classified, left upper arm: Secondary | ICD-10-CM | POA: Diagnosis not present

## 2023-09-24 DIAGNOSIS — M6281 Muscle weakness (generalized): Secondary | ICD-10-CM | POA: Diagnosis not present

## 2023-09-26 DIAGNOSIS — M62522 Muscle wasting and atrophy, not elsewhere classified, left upper arm: Secondary | ICD-10-CM | POA: Diagnosis not present

## 2023-09-26 DIAGNOSIS — M62521 Muscle wasting and atrophy, not elsewhere classified, right upper arm: Secondary | ICD-10-CM | POA: Diagnosis not present

## 2023-09-26 DIAGNOSIS — M6281 Muscle weakness (generalized): Secondary | ICD-10-CM | POA: Diagnosis not present

## 2023-10-01 DIAGNOSIS — M62521 Muscle wasting and atrophy, not elsewhere classified, right upper arm: Secondary | ICD-10-CM | POA: Diagnosis not present

## 2023-10-01 DIAGNOSIS — M62522 Muscle wasting and atrophy, not elsewhere classified, left upper arm: Secondary | ICD-10-CM | POA: Diagnosis not present

## 2023-10-01 DIAGNOSIS — M6281 Muscle weakness (generalized): Secondary | ICD-10-CM | POA: Diagnosis not present

## 2023-10-04 ENCOUNTER — Ambulatory Visit: Payer: Medicare PPO | Admitting: Physician Assistant

## 2023-10-04 DIAGNOSIS — M170 Bilateral primary osteoarthritis of knee: Secondary | ICD-10-CM

## 2023-10-04 DIAGNOSIS — M19042 Primary osteoarthritis, left hand: Secondary | ICD-10-CM

## 2023-10-04 DIAGNOSIS — M797 Fibromyalgia: Secondary | ICD-10-CM

## 2023-10-04 DIAGNOSIS — M19071 Primary osteoarthritis, right ankle and foot: Secondary | ICD-10-CM

## 2023-10-04 DIAGNOSIS — M0579 Rheumatoid arthritis with rheumatoid factor of multiple sites without organ or systems involvement: Secondary | ICD-10-CM

## 2023-10-04 DIAGNOSIS — Z79899 Other long term (current) drug therapy: Secondary | ICD-10-CM

## 2023-10-04 DIAGNOSIS — E2740 Unspecified adrenocortical insufficiency: Secondary | ICD-10-CM

## 2023-10-04 DIAGNOSIS — M5030 Other cervical disc degeneration, unspecified cervical region: Secondary | ICD-10-CM

## 2023-10-04 DIAGNOSIS — Z8679 Personal history of other diseases of the circulatory system: Secondary | ICD-10-CM

## 2023-10-04 DIAGNOSIS — M810 Age-related osteoporosis without current pathological fracture: Secondary | ICD-10-CM

## 2023-10-05 DIAGNOSIS — M62522 Muscle wasting and atrophy, not elsewhere classified, left upper arm: Secondary | ICD-10-CM | POA: Diagnosis not present

## 2023-10-05 DIAGNOSIS — M6281 Muscle weakness (generalized): Secondary | ICD-10-CM | POA: Diagnosis not present

## 2023-10-05 DIAGNOSIS — M62521 Muscle wasting and atrophy, not elsewhere classified, right upper arm: Secondary | ICD-10-CM | POA: Diagnosis not present

## 2023-10-08 DIAGNOSIS — M62521 Muscle wasting and atrophy, not elsewhere classified, right upper arm: Secondary | ICD-10-CM | POA: Diagnosis not present

## 2023-10-08 DIAGNOSIS — M62522 Muscle wasting and atrophy, not elsewhere classified, left upper arm: Secondary | ICD-10-CM | POA: Diagnosis not present

## 2023-10-08 DIAGNOSIS — M6281 Muscle weakness (generalized): Secondary | ICD-10-CM | POA: Diagnosis not present

## 2023-10-09 ENCOUNTER — Ambulatory Visit: Payer: Medicare PPO | Attending: Physician Assistant | Admitting: Physician Assistant

## 2023-10-09 ENCOUNTER — Encounter: Payer: Self-pay | Admitting: Physician Assistant

## 2023-10-09 VITALS — BP 125/77 | HR 83 | Resp 14 | Ht 64.5 in | Wt 165.0 lb

## 2023-10-09 DIAGNOSIS — N390 Urinary tract infection, site not specified: Secondary | ICD-10-CM

## 2023-10-09 DIAGNOSIS — Z79899 Other long term (current) drug therapy: Secondary | ICD-10-CM | POA: Diagnosis not present

## 2023-10-09 DIAGNOSIS — M19041 Primary osteoarthritis, right hand: Secondary | ICD-10-CM

## 2023-10-09 DIAGNOSIS — Z87898 Personal history of other specified conditions: Secondary | ICD-10-CM

## 2023-10-09 DIAGNOSIS — M810 Age-related osteoporosis without current pathological fracture: Secondary | ICD-10-CM | POA: Diagnosis not present

## 2023-10-09 DIAGNOSIS — E2740 Unspecified adrenocortical insufficiency: Secondary | ICD-10-CM

## 2023-10-09 DIAGNOSIS — M19071 Primary osteoarthritis, right ankle and foot: Secondary | ICD-10-CM | POA: Diagnosis not present

## 2023-10-09 DIAGNOSIS — M5030 Other cervical disc degeneration, unspecified cervical region: Secondary | ICD-10-CM

## 2023-10-09 DIAGNOSIS — M797 Fibromyalgia: Secondary | ICD-10-CM

## 2023-10-09 DIAGNOSIS — Z8679 Personal history of other diseases of the circulatory system: Secondary | ICD-10-CM

## 2023-10-09 DIAGNOSIS — M19042 Primary osteoarthritis, left hand: Secondary | ICD-10-CM

## 2023-10-09 DIAGNOSIS — M0579 Rheumatoid arthritis with rheumatoid factor of multiple sites without organ or systems involvement: Secondary | ICD-10-CM

## 2023-10-09 DIAGNOSIS — A419 Sepsis, unspecified organism: Secondary | ICD-10-CM

## 2023-10-09 DIAGNOSIS — E099 Drug or chemical induced diabetes mellitus without complications: Secondary | ICD-10-CM

## 2023-10-09 DIAGNOSIS — T380X5D Adverse effect of glucocorticoids and synthetic analogues, subsequent encounter: Secondary | ICD-10-CM

## 2023-10-09 DIAGNOSIS — Z8719 Personal history of other diseases of the digestive system: Secondary | ICD-10-CM

## 2023-10-09 DIAGNOSIS — M170 Bilateral primary osteoarthritis of knee: Secondary | ICD-10-CM | POA: Diagnosis not present

## 2023-10-09 DIAGNOSIS — M19072 Primary osteoarthritis, left ankle and foot: Secondary | ICD-10-CM

## 2023-10-09 NOTE — Progress Notes (Signed)
Office Visit Note  Patient: Eileen Wilkerson             Date of Birth: August 08, 1932           MRN: [id number]             PCP: Mast, [name], NP Referring: Mast, [name], NP Visit Date: 10/09/2023 Occupation: @GUAROCC @  Subjective:  Right hand deformity   History of Present Illness: Eileen Wilkerson is a 89 y.o. female with history of seropositive rheumatoid arthritis.  Patient was last seen in the office on 01/10/2023.  [facility] living at [facility] and was brought today via transportation services.  She has been under the care of [facility] and has had frequent follow-up.  Patient remains on prednisone 5 mg daily for management of rheumatoid arthritis.  She has been taking tramadol 50 mg 1 tablet every 6 hours as needed for pain relief.  Patient feels that her pain levels have been adequately controlled but she has a significant difficulty using her right hand.  Patient states that she has been working with occupational therapy 3 days a week.      Activities of Daily Living:  Patient reports morning stiffness for 2 hours.   Patient Reports nocturnal pain.  Difficulty dressing/grooming: Reports Difficulty climbing stairs: Reports Difficulty getting out of chair: Reports Difficulty using hands for taps, buttons, cutlery, and/or writing: Reports  Review of Systems  Constitutional:  Positive for fatigue.  HENT:  Negative for mouth sores, mouth dryness and nose dryness.   Eyes:  Positive for dryness. Negative for pain and visual disturbance.  Respiratory:  Negative for cough, hemoptysis, shortness of breath and difficulty breathing.   Cardiovascular:  Negative for chest pain, palpitations, hypertension and swelling in legs/feet.  Gastrointestinal:  Negative for blood in stool, constipation and diarrhea.  Endocrine: Negative for increased urination.  Genitourinary:  Negative for painful urination.  Musculoskeletal:  Positive for joint pain, joint pain, joint swelling,  myalgias, muscle weakness, morning stiffness, muscle tenderness and myalgias.  Skin:  Negative for color change, pallor, rash, hair loss, nodules/bumps, skin tightness, ulcers and sensitivity to sunlight.  Allergic/Immunologic: Negative for susceptible to infections.  Neurological:  Negative for dizziness, numbness, headaches and weakness.  Hematological:  Negative for swollen glands.  Psychiatric/Behavioral:  Positive for sleep disturbance. Negative for depressed mood. The patient is not nervous/anxious.     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   Acute metabolic encephalopathy 02/12/2023   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 02/12/2023   Urinary frequency 02/08/2023   Left hip pain 02/08/2023   Chronic diastolic heart failure (HCC) 11/06/2022   Gout 11/06/2022   Sepsis secondary to UTI (HCC) 10/27/2022   History of DVT (deep vein thrombosis) 05/26/2022   E coli bacteremia    Sepsis (HCC) 05/21/2022   Diarrhea 05/21/2022   Gait abnormality 01/26/2022   Sinus tachycardia 10/27/2021   Weight gain 10/14/2021   Hyperlipidemia 10/13/2021   Dysuria 06/23/2021   Osteopenia 06/07/2021   Adnexal cyst 03/03/2021   AK (actinic keratosis) 03/03/2021   UTI (urinary tract infection) 07/22/2020   Allergic rhinitis 07/22/2020   Asthma 07/08/2020   Perforation of sigmoid colon due to diverticulitis 06/23/2020   Hair loss 08/06/2019   SOB (shortness of breath) on exertion 08/06/2019   Pure hypercholesterolemia  08/06/2019   B12 deficiency 03/11/2018   CKD (chronic kidney disease) stage 3, GFR 30-59 ml/min (HCC) 02/14/2018   E. coli UTI 01/18/2018   Edema of both lower extremities due to peripheral venous insufficiency 09/18/2017   Bilateral lower extremity edema 06/30/2017   Physical deconditioning 05/30/2017    Immunosuppressed status (HCC) 05/30/2017   Abnormal chest x-ray 05/17/2017   Hypokalemia 04/25/2017   Fibromyalgia 12/18/2016   Spondylosis of lumbar region without myelopathy or radiculopathy 12/18/2016   Trigger finger, right middle finger 12/18/2016   Primary osteoarthritis of both feet 12/18/2016   Primary osteoarthritis of both knees 12/18/2016   GERD (gastroesophageal reflux disease) 12/18/2016   Age-related osteoporosis without current pathological fracture 12/18/2016   Rheumatoid arthritis (HCC) 12/28/2015   Long term current use of systemic steroids, for RA 12/28/2015   Lymphocytosis 11/13/2012    Past Medical History:  Diagnosis Date   Adrenal failure (HCC)    Arthritis    Asthma    Cancer (HCC)    Cataract    Closed nondisplaced fracture of fifth right metatarsal bone 09/18/2017   Diverticulitis    Per patient   E coli bacteremia    Fibromyalgia 2008   GERD (gastroesophageal reflux disease) 12/18/2016   HCAP (healthcare-associated pneumonia) 02/03/2018   Hyperlipidemia 10/13/2021   Osteoporosis    RA (rheumatoid arthritis) (HCC)    Recurrent upper respiratory infection (URI)    Sepsis due to urinary tract infection (HCC) 01/18/2018   Urticaria     Family History  Problem Relation Age of Onset   Heart attack Maternal Grandmother    Heart attack Paternal Grandfather    Breast cancer Mother 51   Diabetes Father    Heart disease Father    Congestive Heart Failure Father 56       Died from   Allergic rhinitis Neg Hx    Asthma Neg Hx    Eczema Neg Hx    Urticaria Neg Hx    Past Surgical History:  Procedure Laterality Date   BACK SURGERY     BILATERAL CARPAL TUNNEL RELEASE  2005   right and left   CATARACT EXTRACTION, BILATERAL  2004   right and left   CERVICAL FUSION  2011,2010,2008   2 disks   HEEL SPUR SURGERY  2004   lower back fusion  2011   Fusion of 3-4 and 4-5 lower back   RADIOFREQUENCY ABLATION  2020   ROTATOR CUFF REPAIR  [id number]    SQUAMOUS CELL CARCINOMA EXCISION     TONSILLECTOMY AND ADENOIDECTOMY  1947   TOTAL SHOULDER ARTHROPLASTY     Social History   Social History Narrative      Diet:        Do you drink/ eat things with caffeine? Dr. Reino Kent 2/ day      Marital status: Widowed                              What year were you married ? 1953      Do you live in a house, apartment,assistred living, condo, trailer, etc.)? Apartment      Is it one or more stories?       How many persons live in your home ? 1      Do you have any pets in your home ?(please list) No      Highest Level of education completed: PhD       Current  or past profession: Paramedic, Child psychotherapist, Special Educator       Do you exercise?   No                           Type & how often       ADVANCED DIRECTIVES (Please bring copies)      Do you have a living will? Yes      Do you have a DNR form?                       If not, do you want to discuss one? Yes      Do you have signed POA?HPOA forms?                 If so, please bring to your appointment Tes      FUNCTIONAL STATUS- To be completed by Spouse / child / Staff       Do you have difficulty bathing or dressing yourself ? No      Do you have difficulty preparing food or eating ? No      Do you have difficulty managing your mediation ? No      Do you have difficulty managing your finances ? No      Do you have difficulty affording your medication ? No      Immunization History  Administered Date(s) Administered   Fluad Quad(high Dose 65+) 09/30/2020   Influenza, High Dose Seasonal PF 08/16/2018, 09/08/2019, 09/24/2020, 10/04/2021   Influenza,inj,Quad PF,6+ Mos 08/11/2016   Influenza-Unspecified 11/12/2017, 10/05/2022   Moderna Covid-19 Vaccine Bivalent Booster 23yrs & up 10/04/2021   Moderna Sars-Covid-2 Vaccination 12/15/2019, 01/12/2020, 10/19/2020   PNEUMOCOCCAL CONJUGATE-20 10/04/2021   PPD Test 03/01/2018   Pneumococcal Conjugate-13 06/26/2017    Pneumococcal Polysaccharide-23 12/11/2009   Tdap 04/04/2023   Zoster Recombinant(Shingrix) 01/28/2019, 09/02/2019     Objective: Vital Signs: BP 125/77 (BP Location: Left Arm, Patient Position: Sitting, Cuff Size: Normal)   Pulse 83   Resp 14   Ht 5' 4.5" (1.638 m)   Wt 165 lb (74.8 kg) Comment: per patient  BMI 27.88 kg/m    Physical Exam Vitals and nursing note reviewed.  Constitutional:      Appearance: She is well-developed.  HENT:     Head: Normocephalic and atraumatic.  Eyes:     Conjunctiva/sclera: Conjunctivae normal.  Cardiovascular:     Rate and Rhythm: Normal rate and regular rhythm.     Heart sounds: Normal heart sounds.  Pulmonary:     Effort: Pulmonary effort is normal.     Breath sounds: Normal breath sounds.  Abdominal:     General: Bowel sounds are normal.     Palpations: Abdomen is soft.  Musculoskeletal:     Cervical back: Normal range of motion.  Lymphadenopathy:     Cervical: No cervical adenopathy.  Skin:    General: Skin is warm and dry.     Capillary Refill: Capillary refill takes less than 2 seconds.  Neurological:     Mental Status: She is alert and oriented to person, place, and time.  Psychiatric:        Behavior: Behavior normal.      Musculoskeletal Exam:Patient remained in motorized wheelchair during examination today.  C-spine has limited ROM.  Painful and limited ROM of both shoulders.   Severe synovial thickening of MCP joints. Ulnar deviation noted bilaterally, right hand more severe than left.  Limited extension of right 2-5th MCP joints.  Knee joints have good ROM with no warmth or effusion.  Ankle joints have good ROM with no tenderness or joint swelling.     CDAI Exam: CDAI Score: -- Patient Global: --; Provider Global: -- Swollen: --; Tender: -- Joint Exam 10/09/2023   No joint exam has been documented for this visit   There is currently no information documented on the homunculus. Go to the Rheumatology activity and  complete the homunculus joint exam.  Investigation: No additional findings.  Imaging: No results found.  Recent Labs: Lab Results  Component Value Date   WBC 8.9 06/12/2023   HGB 11.1 (A) 06/12/2023   PLT 265 06/12/2023   NA 141 03/27/2023   K 4.3 03/27/2023   CL 104 03/27/2023   CO2 30 (A) 03/27/2023   GLUCOSE 174 (H) 02/14/2023   BUN 13 03/27/2023   CREATININE 1.0 03/27/2023   BILITOT 1.4 (H) 02/12/2023   ALKPHOS 53 02/20/2023   AST 12 (A) 02/20/2023   ALT 16 02/20/2023   PROT 7.9 02/12/2023   ALBUMIN 3.5 02/20/2023   CALCIUM 9.1 02/20/2023   GFRAA 56 (L) 02/03/2021    Speciality Comments: PLQ eye exam:10/22/18 Normal. [facility] Follow up in 6 months. Prior therapy: MTX (recurrent infections) Therapy Contraindications: Anti-TNF's due to CHF  Procedures:  No procedures performed Allergies: Adhesive [tape], Celebrex [celecoxib], Ciprofibrate, Cymbalta [duloxetine hcl], Gabitril [tiagabine], Lyrica [pregabalin], Neurontin [gabapentin], Nexium [esomeprazole], Nsaids, Shrimp [shellfish allergy], Azactam [aztreonam], Azelastine hcl, Ciprofloxacin, Claritin [loratadine], Methotrexate derivatives, Nasacort [triamcinolone], Olopatadine, Other, Sulfamethizole, Zantac [ranitidine hcl], Claritin-d 12 hour [loratadine-pseudoephedrine er], Keflex [cephalexin], Penicillins, and Sulfa antibiotics   Assessment / Plan:     Visit Diagnoses: Rheumatoid arthritis involving multiple sites with positive rheumatoid factor [facility]): Patient presents today with severe synovial thickening and subluxation of the MCP joints primarily affecting her right hand.  She has had difficulty performing ADLs due to the severity of deformity.  She has been working with an occupational therapist 3 days a week.  Her pain level has been adequately controlled with the use of prednisone 5 mg daily and tramadol 50 mg 1 tablet every 6 hours as needed for pain relief. Patient's treatment options remain limited  given history of sepsis, recurrent infections, hospital associated pneumonia, diverticulitis, DVT, CKD, and chronic diastolic heart failure. Previous therapy includes Enbrel, Plaquenil, Arava, methotrexate.   She is not a good candidate for TNF inhibitors given history of chronic diastolic heart failure as well as history of sepsis. She is not a good candidate for Jak inhibitors given history of DVT and diverticulitis. Methotrexate is listed on her allergy list.  Methotrexate was discontinued due to recurrent infections. Patient has a sulfa allergy and is not a good candidate for sulfasalazine. Dr. Corliss Skains and I both recommend continuing occupational therapy at [facility]. Offered a referral to hand surgery to discuss surgical intervention options but she has declined at this time. Patient plans on remaining on prednisone 5 mg daily and taking tramadol 50 mg every 6 hours as needed for pain relief.  She will follow-up with [place] on an as-needed basis.  High risk medication use: Not a good candidate for immunosuppressive agents given past medical history and recurrent infections.  Primary osteoarthritis of both hands: Patient is currently working with an occupational therapy 3 days a week.  She has had significant difficulty using her right hand due to the the loss of extension and subluxation of her MCP joints involving the right  hand.  She has difficulty making complete fist.  Patient has declined a hand surgery referral at this time.  Primary osteoarthritis of both knees: Patient has good range of motion of both knee joints on examination today.  No warmth or effusion noted.  She is using a motorized wheelchair.   Primary osteoarthritis of both feet: She is not experiencing any increased discomfort in her feet at this time.   Age-related osteoporosis without current pathological fracture: Patient is taking vitamin D supplement daily.  Fibromyalgia: She is taking tramadol 50 mg every 6 hours as  needed for pain relief.   DDD (degenerative disc disease), cervical: Limited ROM with lateral rotation.   Other medical conditions are listed as follows:   History of fatigue: Chronic   Adrenal insufficiency (HCC)  History of CHF (congestive heart failure)  Steroid-induced diabetes mellitus, subsequent encounter (HCC)  Sepsis secondary to UTI [facility]): Not a good candidate for immunosuppressive agents.  History of diverticulitis  Orders: No orders of the defined types were placed in this encounter.  No orders of the defined types were placed in this encounter.  Follow-Up Instructions: Return if symptoms worsen or fail to improve.   Gearldine Bienenstock, PA-C  Note - This record has been created using Dragon software.  Chart creation errors have been sought, but may not always  have been located. Such creation errors do not reflect on  the standard of medical care.

## 2023-10-10 DIAGNOSIS — M62521 Muscle wasting and atrophy, not elsewhere classified, right upper arm: Secondary | ICD-10-CM | POA: Diagnosis not present

## 2023-10-10 DIAGNOSIS — M62522 Muscle wasting and atrophy, not elsewhere classified, left upper arm: Secondary | ICD-10-CM | POA: Diagnosis not present

## 2023-10-10 DIAGNOSIS — M6281 Muscle weakness (generalized): Secondary | ICD-10-CM | POA: Diagnosis not present

## 2023-10-15 DIAGNOSIS — M62522 Muscle wasting and atrophy, not elsewhere classified, left upper arm: Secondary | ICD-10-CM | POA: Diagnosis not present

## 2023-10-15 DIAGNOSIS — M6281 Muscle weakness (generalized): Secondary | ICD-10-CM | POA: Diagnosis not present

## 2023-10-15 DIAGNOSIS — M62521 Muscle wasting and atrophy, not elsewhere classified, right upper arm: Secondary | ICD-10-CM | POA: Diagnosis not present

## 2023-10-17 DIAGNOSIS — M62522 Muscle wasting and atrophy, not elsewhere classified, left upper arm: Secondary | ICD-10-CM | POA: Diagnosis not present

## 2023-10-17 DIAGNOSIS — M6281 Muscle weakness (generalized): Secondary | ICD-10-CM | POA: Diagnosis not present

## 2023-10-17 DIAGNOSIS — M62521 Muscle wasting and atrophy, not elsewhere classified, right upper arm: Secondary | ICD-10-CM | POA: Diagnosis not present

## 2023-10-23 DIAGNOSIS — M6281 Muscle weakness (generalized): Secondary | ICD-10-CM | POA: Diagnosis not present

## 2023-10-23 DIAGNOSIS — M62522 Muscle wasting and atrophy, not elsewhere classified, left upper arm: Secondary | ICD-10-CM | POA: Diagnosis not present

## 2023-10-23 DIAGNOSIS — M62521 Muscle wasting and atrophy, not elsewhere classified, right upper arm: Secondary | ICD-10-CM | POA: Diagnosis not present

## 2023-10-24 DIAGNOSIS — M6281 Muscle weakness (generalized): Secondary | ICD-10-CM | POA: Diagnosis not present

## 2023-10-24 DIAGNOSIS — M62522 Muscle wasting and atrophy, not elsewhere classified, left upper arm: Secondary | ICD-10-CM | POA: Diagnosis not present

## 2023-10-24 DIAGNOSIS — M62521 Muscle wasting and atrophy, not elsewhere classified, right upper arm: Secondary | ICD-10-CM | POA: Diagnosis not present

## 2023-10-29 DIAGNOSIS — M6281 Muscle weakness (generalized): Secondary | ICD-10-CM | POA: Diagnosis not present

## 2023-10-29 DIAGNOSIS — M62521 Muscle wasting and atrophy, not elsewhere classified, right upper arm: Secondary | ICD-10-CM | POA: Diagnosis not present

## 2023-10-29 DIAGNOSIS — M62522 Muscle wasting and atrophy, not elsewhere classified, left upper arm: Secondary | ICD-10-CM | POA: Diagnosis not present

## 2023-10-31 DIAGNOSIS — M62522 Muscle wasting and atrophy, not elsewhere classified, left upper arm: Secondary | ICD-10-CM | POA: Diagnosis not present

## 2023-10-31 DIAGNOSIS — M6281 Muscle weakness (generalized): Secondary | ICD-10-CM | POA: Diagnosis not present

## 2023-10-31 DIAGNOSIS — M62521 Muscle wasting and atrophy, not elsewhere classified, right upper arm: Secondary | ICD-10-CM | POA: Diagnosis not present

## 2023-11-01 ENCOUNTER — Other Ambulatory Visit: Payer: Self-pay | Admitting: Orthopedic Surgery

## 2023-11-02 ENCOUNTER — Non-Acute Institutional Stay (INDEPENDENT_AMBULATORY_CARE_PROVIDER_SITE_OTHER): Payer: Medicare PPO | Admitting: Nurse Practitioner

## 2023-11-02 ENCOUNTER — Encounter: Payer: Self-pay | Admitting: Nurse Practitioner

## 2023-11-02 DIAGNOSIS — Z0000 Encounter for general adult medical examination without abnormal findings: Secondary | ICD-10-CM | POA: Diagnosis not present

## 2023-11-02 DIAGNOSIS — M8580 Other specified disorders of bone density and structure, unspecified site: Secondary | ICD-10-CM | POA: Diagnosis not present

## 2023-11-05 ENCOUNTER — Encounter: Payer: Self-pay | Admitting: Nurse Practitioner

## 2023-11-05 DIAGNOSIS — M6281 Muscle weakness (generalized): Secondary | ICD-10-CM | POA: Diagnosis not present

## 2023-11-05 DIAGNOSIS — M62521 Muscle wasting and atrophy, not elsewhere classified, right upper arm: Secondary | ICD-10-CM | POA: Diagnosis not present

## 2023-11-05 DIAGNOSIS — M62522 Muscle wasting and atrophy, not elsewhere classified, left upper arm: Secondary | ICD-10-CM | POA: Diagnosis not present

## 2023-11-05 NOTE — Progress Notes (Signed)
Subjective:   Eileen Wilkerson is a 89 y.o. female who presents for Medicare Annual (Subsequent) preventive examination.  Visit Complete: In person  Patient Medicare AWV questionnaire was completed by the patient on 11/02/23; I have confirmed that all information answered by patient is correct and no changes since this date.  Cardiac Risk Factors include: advanced age (>23men, >38 women);diabetes mellitus;hypertension;dyslipidemia     Objective:    Today's Vitals   11/02/23 0950 11/05/23 1450  BP: (!) 140/65   Pulse: 81   Resp: 16   Temp: (!) 97.2 F (36.2 C)   SpO2: 95%   Weight: 165 lb 3.2 oz (74.9 kg)   Height: 5\' 4"  (1.626 m)   PainSc:  5    Body mass index is 28.36 kg/m.     11/02/2023    9:53 AM 09/19/2023    1:54 PM 09/06/2023    2:28 PM 08/07/2023   12:23 PM 03/26/2023    4:06 PM 03/22/2023   10:53 AM 03/20/2023   11:23 AM  Advanced Directives  Does Patient Have a Medical Advance Directive? Yes Yes Yes Yes Yes Yes Yes  Type of Estate agent of [place];Living will Healthcare Power of [place];Living will Healthcare Power of [employer] Power of [employer] Power of [place];Living will Healthcare Power of [place];Living will Healthcare Power of [place];Living will  Does patient want to make changes to medical advance directive? No - Patient declined No - Patient declined No - Patient declined No - Patient declined No - Patient declined No - Patient declined No - Patient declined  Copy of Healthcare Power of Attorney in Chart? Yes - validated most recent copy scanned in chart (See row information) Yes - validated most recent copy scanned in chart (See row information) Yes - validated most recent copy scanned in chart (See row information) Yes - validated most recent copy scanned in chart (See row information) Yes - validated most recent copy scanned in chart (See row information) Yes - validated most recent copy scanned in chart  (See row information) Yes - validated most recent copy scanned in chart (See row information)    Current Medications (verified) Outpatient Encounter Medications as of 11/02/2023  Medication Sig   acetaminophen (TYLENOL) 325 MG tablet Take 650 mg by mouth every 4 (four) hours as needed.   amLODipine (NORVASC) 5 MG tablet Take 5 mg by mouth daily.   atorvastatin (LIPITOR) 20 MG tablet TAKE 1 TABLET BY MOUTH EVERY DAY   Biotin 5 MG TBDP Take 1 tablet (5 mg total) by mouth daily.   Cholecalciferol (VITAMIN D3) 125 MCG (5000 UT) CAPS TAKE 1 CAPSULE BY MOUTH EVERY DAY   colchicine 0.6 MG tablet TAKE 1 TABLET BY MOUTH EVERY DAY   CRANBERRY PO Take 1 capsule by mouth 2 (two) times daily.   cycloSPORINE (RESTASIS) 0.05 % ophthalmic emulsion Place 1 drop into both eyes 2 (two) times daily.   D-Mannose 500 MG CAPS Take 1 tablet by mouth daily.   diclofenac Sodium (VOLTAREN) 1 % GEL APPLY 2 TO 4 GRAMS TOPICALLY TO AFFECTED JOINTS UP TO 4 TIMES DAILY   ipratropium-albuterol (DUONEB) 0.5-2.5 (3) MG/3ML SOLN Take 3 mLs by nebulization as needed.   lansoprazole (PREVACID) 30 MG capsule TAKE 1 CAPSULE BY MOUTH ONCE DAILY AT NOON   montelukast (SINGULAIR) 10 MG tablet TAKE 1 TABLET BY MOUTH EVERYDAY AT BEDTIME   potassium chloride SA (KLOR-CON M) 20 MEQ tablet Take 40 mEq by mouth 2 (two)  times daily.   predniSONE (DELTASONE) 5 MG tablet Take 5 mg by mouth daily with breakfast.   Torsemide 40 MG TABS Take 1 tablet by mouth 2 (two) times daily.   traMADol (ULTRAM) 50 MG tablet Take 1 tablet (50 mg total) by mouth every 6 (six) hours as needed (pain).   fexofenadine (ALLEGRA) 180 MG tablet Take 1 tablet (180 mg total) by mouth daily.   ketoconazole (NIZORAL) 2 % shampoo APPLY TOPICALLY ONCE A WEEK (Patient not taking: Reported on 11/02/2023)   nystatin ointment (MYCOSTATIN) 100000 UNIT/GM (Nystatin (Topical)) Apply to bi later breast and groin topically two times a day for rash for 2 Weeks (Patient not taking:  Reported on 11/02/2023)   saccharomyces boulardii (FLORASTOR) 250 MG capsule Take 250 mg by mouth 2 (two) times daily. (Patient not taking: Reported on 10/09/2023)   torsemide (DEMADEX) 20 MG tablet Take 40 mg by mouth 2 (two) times daily. (Patient not taking: Reported on 11/02/2023)   No facility-administered encounter medications on file as of 11/02/2023.    Allergies (verified) Adhesive [tape], Celebrex [celecoxib], Ciprofibrate, Cymbalta [duloxetine hcl], Gabitril [tiagabine], Lyrica [pregabalin], Neurontin [gabapentin], Nexium [esomeprazole], Nsaids, Shrimp [shellfish allergy], Azactam [aztreonam], Azelastine hcl, Ciprofloxacin, Claritin [loratadine], Methotrexate derivatives, Nasacort [triamcinolone], Olopatadine, Other, Sulfamethizole, Zantac [ranitidine hcl], Claritin-d 12 hour [loratadine-pseudoephedrine er], Keflex [cephalexin], Penicillins, and Sulfa antibiotics   History: Past Medical History:  Diagnosis Date   Adrenal failure (HCC)    Arthritis    Asthma    Cancer (HCC)    Cataract    Closed nondisplaced fracture of fifth right metatarsal bone 09/18/2017   Diverticulitis    Per patient   E coli bacteremia    Fibromyalgia 2008   GERD (gastroesophageal reflux disease) 12/18/2016   HCAP (healthcare-associated pneumonia) 02/03/2018   Hyperlipidemia 10/13/2021   Osteoporosis    RA (rheumatoid arthritis) (HCC)    Recurrent upper respiratory infection (URI)    Sepsis due to urinary tract infection (HCC) 01/18/2018   Urticaria    Past Surgical History:  Procedure Laterality Date   BACK SURGERY     BILATERAL CARPAL TUNNEL RELEASE  2005   right and left   CATARACT EXTRACTION, BILATERAL  2004   right and left   CERVICAL FUSION  2011,2010,2008   2 disks   HEEL SPUR SURGERY  2004   lower back fusion  2011   Fusion of 3-4 and 4-5 lower back   RADIOFREQUENCY ABLATION  2020   ROTATOR CUFF REPAIR  [id number]   SQUAMOUS CELL CARCINOMA EXCISION     TONSILLECTOMY AND  ADENOIDECTOMY  1947   TOTAL SHOULDER ARTHROPLASTY     Family History  Problem Relation Age of Onset   Heart attack Maternal Grandmother    Heart attack Paternal Grandfather    Breast cancer Mother 52   Diabetes Father    Heart disease Father    Congestive Heart Failure Father 81       Died from   Allergic rhinitis Neg Hx    Asthma Neg Hx    Eczema Neg Hx    Urticaria Neg Hx    Social History   Socioeconomic History   Marital status: Widowed    Spouse name: Not on file   Number of children: 2   Years of education: Not on file   Highest education level: Not on file  Occupational History   Occupation: Retired Child psychotherapist  Tobacco Use   Smoking status: Former    Current packs/day: 0.00  Average packs/day: 0.8 packs/day for 10.0 years (7.5 ttl pk-yrs)    Types: Cigarettes    Start date: 45    Quit date: 1968    Years since quitting: 56.9    Passive exposure: Never   Smokeless tobacco: Never  Vaping Use   Vaping status: Never Used  Substance and Sexual Activity   Alcohol use: No   Drug use: No   Sexual activity: Not Currently  Other Topics Concern   Not on file  Social History Narrative      Diet:        Do you drink/ eat things with caffeine? Dr. Reino Kent 2/ day      Marital status: Widowed                              What year were you married ? 1953      Do you live in a house, apartment,assistred living, condo, trailer, etc.)? Apartment      Is it one or more stories?       How many persons live in your home ? 1      Do you have any pets in your home ?(please list) No      Highest Level of education completed: PhD       Current or past profession: Paramedic, Child psychotherapist, Special Educator       Do you exercise?   No                           Type & how often       ADVANCED DIRECTIVES (Please bring copies)      Do you have a living will? Yes      Do you have a DNR form?                       If not, do you want to discuss one? Yes      Do  you have signed POA?HPOA forms?                 If so, please bring to your appointment Tes      FUNCTIONAL STATUS- To be completed by Spouse / child / Staff       Do you have difficulty bathing or dressing yourself ? No      Do you have difficulty preparing food or eating ? No      Do you have difficulty managing your mediation ? No      Do you have difficulty managing your finances ? No      Do you have difficulty affording your medication ? No      Social Determinants of Health   Financial Resource Strain: Not on file  Food Insecurity: No Food Insecurity (02/12/2023)   Hunger Vital Sign    Worried About Running Out of Food in the Last Year: Never true    Ran Out of Food in the Last Year: Never true  Transportation Needs: No Transportation Needs (02/12/2023)   PRAPARE - Administrator, Civil Service (Medical): No    Lack of Transportation (Non-Medical): No  Physical Activity: Not on file  Stress: Not on file  Social Connections: Not on file    Tobacco Counseling Counseling given: Not Answered   Clinical Intake:  Pre-visit preparation completed: Yes  Pain : 0-10 Pain Score: 5  Pain Type: Chronic pain Pain Location: Generalized Pain Descriptors / Indicators: Aching Pain Onset: More than a month ago Pain Frequency: Several days a week Pain Relieving Factors: Tramadol Effect of Pain on Daily Activities: none  Pain Relieving Factors: Tramadol  BMI - recorded: 28.36 Nutritional Status: BMI 25 -29 Overweight Nutritional Risks: None Diabetes: Yes CBG done?: No Did pt. bring in CBG monitor from home?: No  How often do you need to have someone help you when you read instructions, pamphlets, or other written materials from your doctor or pharmacy?: 2 - Rarely What is the last grade level you completed in school?: PHD  Interpreter Needed?: No  Information entered by :: Michaelyn Wall Nedra Hai NP   Activities of Daily Living    11/05/2023    2:52 PM 03/20/2023    12:21 PM  In your present state of health, do you have any difficulty performing the following activities:  Hearing? 0 0  Vision? 0 0  Difficulty concentrating or making decisions? 0 0  Walking or climbing stairs? 1 1  Dressing or bathing? 1 1  Doing errands, shopping? 1 1  Preparing Food and eating ? N N  Using the Toilet? N N  In the past six months, have you accidently leaked urine? Y Y  Do you have problems with loss of bowel control? N N  Managing your Medications? Y Y  Managing your Finances? N N  Housekeeping or managing your Housekeeping? Malvin Johns    Patient Care Team: Olof Marcil X, [name]  Indicate any recent Medical Services you may have received from other than [facility] providers in the past year (date may be approximate).     Assessment:   This is a routine wellness examination for Sonakshi.  Hearing/Vision screen No results found.   Goals Addressed             This Visit's Progress    Maintain Mobility and Function       Evidence-based guidance:  Emphasize the importance of physical activity and aerobic exercise as included in treatment plan; assess barriers to adherence; consider patient's abilities and preferences.  Encourage gradual increase in activity or exercise instead of stopping if pain occurs.  Reinforce individual therapy exercise prescription, such as strengthening, stabilization and stretching programs.  Promote optimal body mechanics to stabilize the spine with lifting and functional activity.  Encourage activity and mobility modifications to facilitate optimal function, such as using a log roll for bed mobility or dressing from a seated position.  Reinforce individual adaptive equipment recommendations to limit excessive spinal movements, such as a Event organiser.  Assess adequacy of sleep; encourage use of sleep hygiene techniques, such as bedtime routine; use of white noise; dark, cool bedroom; avoiding daytime  naps, heavy meals or exercise before bedtime.  Promote positions and modification to optimize sleep and sexual activity; consider pillows or positioning devices to assist in maintaining neutral spine.  Explore options for applying ergonomic principles at work and home, such as frequent position changes, using ergonomically designed equipment and working at optimal height.  Promote modifications to increase comfort with driving such as lumbar support, optimizing seat and steering wheel position, using cruise control and taking frequent rest stops to stretch and walk.   Notes:        Depression Screen    11/05/2023    2:54 PM 03/22/2023   10:53 AM 03/13/2023   10:49 AM 03/06/2023   11:57 AM 02/19/2023  2:28 PM 01/03/2023    3:53 PM 01/19/2022   10:54 AM  PHQ 2/9 Scores  PHQ - 2 Score 0 0 0 0 0 0 0    Fall Risk    03/22/2023   10:53 AM 03/13/2023   10:48 AM 03/06/2023   11:57 AM 02/19/2023    2:29 PM 01/31/2023    1:08 PM  Fall Risk   Falls in the past year? 0 0 0 1 0  Number falls in past yr: 0 0 0 1 0  Injury with Fall? 0 0 0 1 0  Risk for fall due to : History of fall(s);Impaired balance/gait History of fall(s);Impaired balance/gait History of fall(s);Impaired balance/gait History of fall(s);Impaired balance/gait Impaired balance/gait;Impaired mobility  Follow up Falls evaluation completed Falls evaluation completed Falls evaluation completed Falls evaluation completed Falls evaluation completed    MEDICARE RISK AT HOME: Medicare Risk at Home Any stairs in or around the home?: Yes If so, are there any without handrails?: No Home free of loose throw rugs in walkways, pet beds, electrical cords, etc?: Yes Adequate lighting in your home to reduce risk of falls?: Yes Life alert?: No Use of a cane, walker or w/c?: Yes Grab bars in the bathroom?: Yes Shower chair or bench in shower?: Yes Elevated toilet seat or a handicapped toilet?: Yes  TIMED UP AND GO:  Was the test performed?   Yes  Length of time to ambulate 10 feet: 12 sec Gait slow and steady with assistive device    Cognitive Function:        01/19/2022   10:57 AM 01/11/2021    1:13 PM 07/09/2018   11:34 AM  6CIT Screen  What Year? 0 points 0 points 0 points  What month? 0 points 0 points 0 points  What time? 0 points 0 points 0 points  Count back from 20 0 points 0 points 0 points  Months in reverse 0 points 0 points 0 points  Repeat phrase 0 points 0 points   Total Score 0 points 0 points     Immunizations Immunization History  Administered Date(s) Administered   Fluad Quad(high Dose 65+) 09/30/2020   Influenza, High Dose Seasonal PF 08/16/2018, 09/08/2019, 09/24/2020, 10/04/2021, 09/12/2023   Influenza,inj,Quad PF,6+ Mos 08/11/2016   Influenza-Unspecified 11/12/2017, 10/05/2022   Moderna Covid-19 Vaccine Bivalent Booster 51yrs & up 10/04/2021, 09/26/2023   Moderna Sars-Covid-2 Vaccination 12/15/2019, 01/12/2020, 10/19/2020   PNEUMOCOCCAL CONJUGATE-20 10/04/2021   PPD Test 03/01/2018   Pneumococcal Conjugate-13 06/26/2017   Pneumococcal Polysaccharide-23 12/11/2009   Tdap 04/04/2023   Zoster Recombinant(Shingrix) 01/28/2019, 09/02/2019    TDAP status: Up to date  Flu Vaccine status: Up to date  Pneumococcal vaccine status: Up to date  Covid-19 vaccine status: Completed vaccines  Qualifies for Shingles Vaccine? Yes   Zostavax completed Yes   Shingrix Completed?: Yes  Screening Tests Health Maintenance  Topic Date Due   COVID-19 Vaccine (6 - 2023-24 season) 11/21/2023   Medicare Annual Wellness (AWV)  11/01/2024   DTaP/Tdap/Td (2 - Td or Tdap) 04/03/2033   Pneumonia Vaccine 66+ Years old  Completed   INFLUENZA VACCINE  Completed   DEXA SCAN  Completed   Zoster Vaccines- Shingrix  Completed   HPV VACCINES  Aged Out    Health Maintenance  There are no preventive care reminders to display for this patient.  Colorectal cancer screening: No longer required.   Mammogram status:  No longer required due to aged out.  Bone Density status: Completed 05/17/2021. Results reflect:  Bone density results: OSTEOPENIA. Repeat every 2 years.  Lung Cancer Screening: (Low Dose CT Chest recommended if Age 83-80 years, 20 pack-year currently smoking OR have quit w/in 15years.) does not qualify.   Lung Cancer Screening Referral: none  Additional Screening:  Hepatitis C Screening: does not qualify;   Vision Screening: Recommended annual ophthalmology exams for early detection of glaucoma and other disorders of the eye. Is the patient up to date with their annual eye exam?  Yes  Who is the provider or what is the name of the office in which the patient attends annual eye exams? [facility] will provide.  If pt is not established with a provider, would they like to be referred to a provider to establish care? No .   Dental Screening: Recommended annual dental exams for proper oral hygiene  Diabetic Foot Exam: 11/02/23  Community Resource Referral / Chronic Care Management: CRR required this visit?  No   CCM required this visit?  No     Plan:     I have personally reviewed and noted the following in the patient's chart:   Medical and social history Use of alcohol, tobacco or illicit drugs  Current medications and supplements including opioid prescriptions. Patient is currently taking opioid prescriptions. Information provided to patient regarding non-opioid alternatives. Patient advised to discuss non-opioid treatment plan with their provider. Functional ability and status Nutritional status Physical activity Advanced directives List of other physicians Hospitalizations, surgeries, and ER visits in previous 12 months Vitals Screenings to include cognitive, depression, and falls Referrals and appointments  In addition, I have reviewed and discussed with patient certain preventive protocols, quality metrics, and best practice recommendations. A written personalized care plan  for preventive services as well as general preventive health recommendations were provided to patient.     Devonte Migues X Nickolas Chalfin, NP   11/05/2023   After Visit Summary: (In Person-Declined) Patient declined AVS at this time.

## 2023-11-06 DIAGNOSIS — M62522 Muscle wasting and atrophy, not elsewhere classified, left upper arm: Secondary | ICD-10-CM | POA: Diagnosis not present

## 2023-11-06 DIAGNOSIS — M6281 Muscle weakness (generalized): Secondary | ICD-10-CM | POA: Diagnosis not present

## 2023-11-06 DIAGNOSIS — M62521 Muscle wasting and atrophy, not elsewhere classified, right upper arm: Secondary | ICD-10-CM | POA: Diagnosis not present

## 2023-11-12 DIAGNOSIS — M62521 Muscle wasting and atrophy, not elsewhere classified, right upper arm: Secondary | ICD-10-CM | POA: Diagnosis not present

## 2023-11-12 DIAGNOSIS — M62522 Muscle wasting and atrophy, not elsewhere classified, left upper arm: Secondary | ICD-10-CM | POA: Diagnosis not present

## 2023-11-12 DIAGNOSIS — M6281 Muscle weakness (generalized): Secondary | ICD-10-CM | POA: Diagnosis not present

## 2023-11-15 DIAGNOSIS — M62522 Muscle wasting and atrophy, not elsewhere classified, left upper arm: Secondary | ICD-10-CM | POA: Diagnosis not present

## 2023-11-15 DIAGNOSIS — M62521 Muscle wasting and atrophy, not elsewhere classified, right upper arm: Secondary | ICD-10-CM | POA: Diagnosis not present

## 2023-11-15 DIAGNOSIS — M6281 Muscle weakness (generalized): Secondary | ICD-10-CM | POA: Diagnosis not present

## 2023-11-19 DIAGNOSIS — M6281 Muscle weakness (generalized): Secondary | ICD-10-CM | POA: Diagnosis not present

## 2023-11-19 DIAGNOSIS — M62521 Muscle wasting and atrophy, not elsewhere classified, right upper arm: Secondary | ICD-10-CM | POA: Diagnosis not present

## 2023-11-19 DIAGNOSIS — M62522 Muscle wasting and atrophy, not elsewhere classified, left upper arm: Secondary | ICD-10-CM | POA: Diagnosis not present

## 2023-11-22 DIAGNOSIS — M62521 Muscle wasting and atrophy, not elsewhere classified, right upper arm: Secondary | ICD-10-CM | POA: Diagnosis not present

## 2023-11-22 DIAGNOSIS — M62522 Muscle wasting and atrophy, not elsewhere classified, left upper arm: Secondary | ICD-10-CM | POA: Diagnosis not present

## 2023-11-22 DIAGNOSIS — M6281 Muscle weakness (generalized): Secondary | ICD-10-CM | POA: Diagnosis not present

## 2023-11-26 ENCOUNTER — Encounter: Payer: Self-pay | Admitting: Sports Medicine

## 2023-11-26 ENCOUNTER — Non-Acute Institutional Stay (SKILLED_NURSING_FACILITY): Payer: Medicare PPO | Admitting: Sports Medicine

## 2023-11-26 DIAGNOSIS — T380X5D Adverse effect of glucocorticoids and synthetic analogues, subsequent encounter: Secondary | ICD-10-CM

## 2023-11-26 DIAGNOSIS — M0579 Rheumatoid arthritis with rheumatoid factor of multiple sites without organ or systems involvement: Secondary | ICD-10-CM | POA: Diagnosis not present

## 2023-11-26 DIAGNOSIS — I872 Venous insufficiency (chronic) (peripheral): Secondary | ICD-10-CM | POA: Diagnosis not present

## 2023-11-26 DIAGNOSIS — K219 Gastro-esophageal reflux disease without esophagitis: Secondary | ICD-10-CM

## 2023-11-26 DIAGNOSIS — M1A9XX0 Chronic gout, unspecified, without tophus (tophi): Secondary | ICD-10-CM | POA: Diagnosis not present

## 2023-11-26 DIAGNOSIS — I10 Essential (primary) hypertension: Secondary | ICD-10-CM | POA: Diagnosis not present

## 2023-11-26 DIAGNOSIS — E099 Drug or chemical induced diabetes mellitus without complications: Secondary | ICD-10-CM | POA: Diagnosis not present

## 2023-11-26 DIAGNOSIS — J309 Allergic rhinitis, unspecified: Secondary | ICD-10-CM | POA: Diagnosis not present

## 2023-11-26 NOTE — Progress Notes (Signed)
This encounter was created in error - please disregard.

## 2023-11-26 NOTE — Progress Notes (Signed)
Provider: Venita Sheffield MD Location:   [facility]   Place of Service:  Assisted living   PCP: Mast, [name], NP [name], [name], [name]  Extended Emergency Contact Information Primary Emergency Contact: [name] [place] of [place] Home Phone: [phone] Mobile Phone: [phone] Relation: Daughter  Code Status:  Goals of Care: Advanced Directive information    11/02/2023    9:53 AM  Advanced Directives  Does Patient Have a Medical Advance Directive? Yes  Type of Estate agent of [place];Living will  Does patient want to make changes to medical advance directive? No - Patient declined  Copy of Healthcare Power of Attorney in Chart? Yes - validated most recent copy scanned in chart (See row information)      No chief complaint on file.   HPI: Patient is a 89 y.o. female seen today for routine visit for chronic disease management. Pt seen and examined in her room. She is laying comfortably on her bed. Opens her eyes on calling her name. States she slept well last night. Does not eat breakfast, wakes up late in the morning and skips breakfast.   Rheumatoid arthritis- follows with rheumatology  Has deformities in her hands Need supervision with her ADLS Reports that she can transfer from bed to chair Ambulates with power scooter for long distances C/o pain all over her body  Currently on tramadol, prednisone.  HTN  Bp 122/64  Denies being dizzy or lightheaded On amlodipine   Pt denies  being depressed or anxious Does not participate in activities  Reports good appetite   Lower extremity swelling  Improved Currently on torsemide and potassium supplements     Past Medical History:  Diagnosis Date   Adrenal failure (HCC)    Arthritis    Asthma    Cancer (HCC)    Cataract    Closed nondisplaced fracture of fifth right metatarsal bone 09/18/2017    Diverticulitis    Per patient   E coli bacteremia    Fibromyalgia 2008   GERD (gastroesophageal reflux disease) 12/18/2016   HCAP (healthcare-associated pneumonia) 02/03/2018   Hyperlipidemia 10/13/2021   Osteoporosis    RA (rheumatoid arthritis) (HCC)    Recurrent upper respiratory infection (URI)    Sepsis due to urinary tract infection (HCC) 01/18/2018   Urticaria    Past Surgical History:  Procedure Laterality Date   BACK SURGERY     BILATERAL CARPAL TUNNEL RELEASE  2005   right and left   CATARACT EXTRACTION, BILATERAL  2004   right and left   CERVICAL FUSION  2011,2010,2008   2 disks   HEEL SPUR SURGERY  2004   lower back fusion  2011   Fusion of 3-4 and 4-5 lower back   RADIOFREQUENCY ABLATION  2020   ROTATOR CUFF REPAIR  [id number]   SQUAMOUS CELL CARCINOMA EXCISION     TONSILLECTOMY AND ADENOIDECTOMY  1947   TOTAL SHOULDER ARTHROPLASTY      reports that she quit smoking about 56 years ago. Her smoking use included cigarettes. She started smoking about 67 years ago. She has a 7.5 pack-year smoking history. She has never been exposed to tobacco smoke. She has never used smokeless tobacco. She reports that she does not drink alcohol and does not use drugs. Social History   Socioeconomic History   Marital status: Widowed    Spouse name: Not on file   Number of children: 2  Years of education: Not on file   Highest education level: Not on file  Occupational History   Occupation: Retired Child psychotherapist  Tobacco Use   Smoking status: Former    Current packs/day: 0.00    Average packs/day: 0.8 packs/day for 10.0 years (7.5 ttl pk-yrs)    Types: Cigarettes    Start date: 59    Quit date: 1968    Years since quitting: 56.9    Passive exposure: Never   Smokeless tobacco: Never  Vaping Use   Vaping status: Never Used  Substance and Sexual Activity   Alcohol use: No   Drug use: No   Sexual activity: Not Currently  Other Topics Concern   Not on file  Social  History Narrative      Diet:        Do you drink/ eat things with caffeine? Dr. Reino Kent 2/ day      Marital status: Widowed                              What year were you married ? 1953      Do you live in a house, apartment,assistred living, condo, trailer, etc.)? Apartment      Is it one or more stories?       How many persons live in your home ? 1      Do you have any pets in your home ?(please list) No      Highest Level of education completed: PhD       Current or past profession: Paramedic, Child psychotherapist, Special Educator       Do you exercise?   No                           Type & how often       ADVANCED DIRECTIVES (Please bring copies)      Do you have a living will? Yes      Do you have a DNR form?                       If not, do you want to discuss one? Yes      Do you have signed POA?HPOA forms?                 If so, please bring to your appointment Tes      FUNCTIONAL STATUS- To be completed by Spouse / child / Staff       Do you have difficulty bathing or dressing yourself ? No      Do you have difficulty preparing food or eating ? No      Do you have difficulty managing your mediation ? No      Do you have difficulty managing your finances ? No      Do you have difficulty affording your medication ? No      Social Drivers of Corporate investment banker Strain: Not on file  Food Insecurity: No Food Insecurity (02/12/2023)   Hunger Vital Sign    Worried About Running Out of Food in the Last Year: Never true    Ran Out of Food in the Last Year: Never true  Transportation Needs: No Transportation Needs (02/12/2023)   PRAPARE - Administrator, Civil Service (Medical): No    Lack of Transportation (Non-Medical): No  Physical  Activity: Not on file  Stress: Not on file  Social Connections: Not on file  Intimate Partner Violence: Not At Risk (02/12/2023)   Humiliation, Afraid, Rape, and Kick questionnaire    Fear of Current or Ex-Partner: No     Emotionally Abused: No    Physically Abused: No    Sexually Abused: No    Functional Status Survey:    Family History  Problem Relation Age of Onset   Heart attack Maternal Grandmother    Heart attack Paternal Grandfather    Breast cancer Mother 57   Diabetes Father    Heart disease Father    Congestive Heart Failure Father 63       Died from   Allergic rhinitis Neg Hx    Asthma Neg Hx    Eczema Neg Hx    Urticaria Neg Hx     Health Maintenance  Topic Date Due   COVID-19 Vaccine (6 - 2024-25 season) 11/21/2023   Medicare Annual Wellness (AWV)  11/01/2024   DTaP/Tdap/Td (2 - Td or Tdap) 04/03/2033   Pneumonia Vaccine 32+ Years old  Completed   INFLUENZA VACCINE  Completed   DEXA SCAN  Completed   Zoster Vaccines- Shingrix  Completed   HPV VACCINES  Aged Out    Allergies  Allergen Reactions   Adhesive [Tape] Rash   Celebrex [Celecoxib] Hives   Ciprofibrate Nausea Only   Cymbalta [Duloxetine Hcl] Swelling   Gabitril [Tiagabine] Swelling   Lyrica [Pregabalin] Swelling   Neurontin [Gabapentin] Swelling   Nexium [Esomeprazole] Rash   Nsaids Rash   Shrimp [Shellfish Allergy] Anaphylaxis    Per patient "shrimp only"   Azactam [Aztreonam]     Hand swelling    Azelastine Hcl     Rash    Ciprofloxacin Other (See Comments)    dizziness   Claritin [Loratadine]     Irritability Nervousness    Methotrexate Derivatives    Nasacort [Triamcinolone]     Dizzy    Olopatadine Other (See Comments)    Pain and lethargy    Other    Sulfamethizole Other (See Comments)    unknown   Zantac [Ranitidine Hcl] Other (See Comments)    unknown   Claritin-D 12 Hour [Loratadine-Pseudoephedrine Er] Anxiety   Keflex [Cephalexin] Nausea And Vomiting    Tolerated Ancef   Penicillins Rash    Injection site reaction. Tolerated cefepime in past. Also reports tolerating a penicillin infusion after this initial rxn ~20 yrs ago.  Has patient had a PCN reaction causing immediate rash,  facial/tongue/throat swelling, SOB or lightheadedness with hypotension: No Has patient had a PCN reaction causing severe rash involving mucus membranes or skin necrosis: No Has patient had a PCN reaction that required hospitalization No Has patient had a PCN reaction occurring within the last 10 years: No     Sulfa Antibiotics Nausea And Vomiting    Outpatient Encounter Medications as of 11/26/2023  Medication Sig   acetaminophen (TYLENOL) 325 MG tablet Take 650 mg by mouth every 4 (four) hours as needed.   amLODipine (NORVASC) 5 MG tablet Take 5 mg by mouth daily.   atorvastatin (LIPITOR) 20 MG tablet TAKE 1 TABLET BY MOUTH EVERY DAY   Biotin 5 MG TBDP Take 1 tablet (5 mg total) by mouth daily.   Cholecalciferol (VITAMIN D3) 125 MCG (5000 UT) CAPS TAKE 1 CAPSULE BY MOUTH EVERY DAY   colchicine 0.6 MG tablet TAKE 1 TABLET BY MOUTH EVERY DAY   CRANBERRY PO Take 1  capsule by mouth 2 (two) times daily.   cycloSPORINE (RESTASIS) 0.05 % ophthalmic emulsion Place 1 drop into both eyes 2 (two) times daily.   D-Mannose 500 MG CAPS Take 1 tablet by mouth daily.   diclofenac Sodium (VOLTAREN) 1 % GEL APPLY 2 TO 4 GRAMS TOPICALLY TO AFFECTED JOINTS UP TO 4 TIMES DAILY   fexofenadine (ALLEGRA) 180 MG tablet Take 1 tablet (180 mg total) by mouth daily.   ipratropium-albuterol (DUONEB) 0.5-2.5 (3) MG/3ML SOLN Take 3 mLs by nebulization as needed.   ketoconazole (NIZORAL) 2 % shampoo APPLY TOPICALLY ONCE A WEEK (Patient not taking: Reported on 11/02/2023)   lansoprazole (PREVACID) 30 MG capsule TAKE 1 CAPSULE BY MOUTH ONCE DAILY AT NOON   montelukast (SINGULAIR) 10 MG tablet TAKE 1 TABLET BY MOUTH EVERYDAY AT BEDTIME   nystatin ointment (MYCOSTATIN) 100000 UNIT/GM (Nystatin (Topical)) Apply to bi later breast and groin topically two times a day for rash for 2 Weeks (Patient not taking: Reported on 11/02/2023)   potassium chloride SA (KLOR-CON M) 20 MEQ tablet Take 40 mEq by mouth 2 (two) times daily.    predniSONE (DELTASONE) 5 MG tablet Take 5 mg by mouth daily with breakfast.   saccharomyces boulardii (FLORASTOR) 250 MG capsule Take 250 mg by mouth 2 (two) times daily. (Patient not taking: Reported on 10/09/2023)   torsemide (DEMADEX) 20 MG tablet Take 40 mg by mouth 2 (two) times daily. (Patient not taking: Reported on 11/02/2023)   Torsemide 40 MG TABS Take 1 tablet by mouth 2 (two) times daily.   traMADol (ULTRAM) 50 MG tablet Take 1 tablet (50 mg total) by mouth every 6 (six) hours as needed (pain).   No facility-administered encounter medications on file as of 11/26/2023.    Review of Systems  Constitutional:  Negative for fever.  HENT:  Negative for sinus pressure and sore throat.   Respiratory:  Negative for cough, shortness of breath and wheezing.   Cardiovascular:  Negative for chest pain, palpitations and leg swelling.  Gastrointestinal:  Negative for abdominal distention, abdominal pain, blood in stool, constipation, diarrhea, nausea and vomiting.  Genitourinary:  Negative for dysuria, frequency and urgency.  Musculoskeletal:  Positive for arthralgias.  Neurological:  Negative for dizziness, weakness and numbness.    There were no vitals filed for this visit. There is no height or weight on file to calculate BMI. Physical Exam Constitutional:      Appearance: Normal appearance.  HENT:     Head: Normocephalic and atraumatic.  Cardiovascular:     Rate and Rhythm: Normal rate and regular rhythm.  Pulmonary:     Effort: Pulmonary effort is normal. No respiratory distress.     Breath sounds: Normal breath sounds. No wheezing.  Abdominal:     General: Bowel sounds are normal. There is no distension.     Tenderness: There is no abdominal tenderness. There is no guarding or rebound.     Comments:    Musculoskeletal:        General: No swelling or tenderness.     Comments: Deformities in hands   Neurological:     Mental Status: She is alert. Mental status is at baseline.      Sensory: No sensory deficit.     Motor: No weakness.     Labs reviewed: Basic Metabolic Panel: Recent Labs    02/12/23 1120 02/13/23 0435 02/14/23 0420 02/20/23 0000 03/27/23 0000  NA 139 140 141 142 141  K 4.1 3.4* 3.2* 4.0 4.3  CL 99 104 110 106 104  CO2 27 25 23  26* 30*  GLUCOSE 125* 104* 174*  --   --   BUN 17 14 20  32* 13  CREATININE 1.09* 0.88 0.85 1.0 1.0  CALCIUM 9.4 9.2 9.1 9.1  --    Liver Function Tests: Recent Labs    02/12/23 1120 02/20/23 0000  AST 26 12*  ALT 15 16  ALKPHOS 61 53  BILITOT 1.4*  --   PROT 7.9  --   ALBUMIN 3.8 3.5   No results for input(s): "LIPASE", "AMYLASE" in the last 8760 hours. No results for input(s): "AMMONIA" in the last 8760 hours. CBC: Recent Labs    02/12/23 1120 02/13/23 0435 02/14/23 0420 02/20/23 0000 06/12/23 0000  WBC 15.7* 12.4* 9.1 12.0 8.9  NEUTROABS 10.8*  --   --   --  3,462.00  HGB 13.7 11.4* 10.5* 12.5 11.1*  HCT 44.7 38.5 34.8* 38 35*  MCV 96.5 97.2 95.1  --   --   PLT 278 250 251 345 265   Cardiac Enzymes: No results for input(s): "CKTOTAL", "CKMB", "CKMBINDEX", "TROPONINI" in the last 8760 hours. BNP: Invalid input(s): "POCBNP" Lab Results  Component Value Date   HGBA1C 6.7 (H) 02/15/2023   Lab Results  Component Value Date   TSH 0.865 02/27/2022   Lab Results  Component Value Date   VITAMINB12 1,377 (H) 05/26/2022   Lab Results  Component Value Date   FOLATE 26.8 02/06/2018   Lab Results  Component Value Date   IRON 89 02/06/2018   TIBC 281 02/06/2018   FERRITIN 74 02/06/2018    Imaging and Procedures obtained prior to SNF admission: MR LUMBAR SPINE WO CONTRAST Result Date: 02/13/2023 CLINICAL DATA:  Lumbar radiculopathy EXAM: MRI LUMBAR SPINE WITHOUT CONTRAST TECHNIQUE: Multiplanar, multisequence MR imaging of the lumbar spine was performed. No intravenous contrast was administered. COMPARISON:  09/09/2019 FINDINGS: Evaluation is limited by motion. Segmentation: 5 lumbar  type vertebral bodies. Alignment: Trace anterolisthesis of L5 on S1, unchanged. Mild levocurvature of the upper lumbar spine, with compensatory dextrocurvature of the lower lumbar spine. Vertebrae:  No fracture, evidence of discitis, or bone lesion. Conus medullaris and cauda equina: Conus extends to the L1 level. Conus and cauda equina appear normal. Paraspinal and other soft tissues: Renal cysts, for which no follow-up is currently indicated. Atrophy of the paraspinous musculature. Disc levels: T12-L1: Disc height loss and mild disc bulge. Mild facet arthropathy. Mild spinal canal stenosis and moderate right neural foraminal narrowing, unchanged. L1-L2: Disc height loss and mild disc bulge moderate facet arthropathy. Ligamentum flavum hypertrophy. Mild-to-moderate spinal canal stenosis, unchanged. Narrowing of the lateral recesses. No neural foraminal narrowing. L2-L3: Disc height loss and minimal disc bulge with superimposed left subarticular disc extrusion with 13 mm caudal migration (series 5, image 11), which appears new from the prior exam. This contacts the descending left L3 nerve roots. Mild spinal canal stenosis. No significant neural foraminal narrowing. L3-L4: Prior fusion with interbody disc spacer. Mild facet arthropathy. Ligamentum flavum hypertrophy. Narrowing of the lateral recesses. Mild spinal canal stenosis and mild bilateral neural foraminal narrowing, unchanged. L4-L5: Prior fusion with interbody disc spacer. Mild facet arthropathy. Narrowing of the lateral recesses. No spinal canal stenosis. Mild bilateral neural foraminal narrowing, unchanged. L5-S1: Trace anterolisthesis with disc unroofing and small central disc protrusion. Moderate facet arthropathy. No spinal canal stenosis or neural foraminal narrowing. IMPRESSION: 1. Evaluation is limited by motion. Within this limitation, there is a new left subarticular disc extrusion  at L2-L3 with caudal migration, which contacts the descending left  L3 nerve roots. 2. L1-L2 mild-to-moderate spinal canal stenosis, unchanged. 3. T12-L1 mild spinal canal stenosis and moderate right neural foraminal narrowing, unchanged. 4. L3-L4 mild spinal canal stenosis and mild bilateral neural foraminal narrowing, unchanged. L4-L5 mild bilateral neural foraminal narrowing, unchanged. 5. Narrowing of the lateral recesses at L1-L2, L3-L4, and L4-L5 could affect the descending L2, L4, and L5 nerve roots, respectively. Electronically Signed   By: Wiliam Ke M.D.   On: 02/13/2023 02:45   CT HEAD WO CONTRAST ( ) Result Date: 02/12/2023 CLINICAL DATA:  Neuro deficit, acute, stroke suspected EXAM: CT HEAD WITHOUT CONTRAST TECHNIQUE: Contiguous axial images were obtained from the base of the skull through the vertex without intravenous contrast. RADIATION DOSE REDUCTION: This exam was performed according to the departmental dose-optimization program which includes automated exposure control, adjustment of the mA and/or kV according to patient size and/or use of iterative reconstruction technique. COMPARISON:  None Available. FINDINGS: Brain: No evidence of acute large vascular territory infarction, hemorrhage, hydrocephalus, extra-axial collection or mass lesion/mass effect. Moderate patchy white matter hypodensities, nonspecific but compatible with chronic microvascular ischemic disease. Cerebral atrophy Vascular: No hyperdense vessel. Skull: No acute fracture. Sinuses/Orbits: Mild paranasal sinus mucosal thickening. No acute orbital findings. Other: No mastoid effusions. IMPRESSION: 1. No evidence of acute intracranial abnormality. 2. Moderate chronic microvascular ischemic disease. Electronically Signed   By: Feliberto Harts M.D.   On: 02/12/2023 17:54   CT ABDOMEN PELVIS W CONTRAST Result Date: 02/12/2023 CLINICAL DATA:  89 year old female status post fall from toilet this morning. Abdominal pain. EXAM: CT ABDOMEN AND PELVIS WITH CONTRAST TECHNIQUE: Multidetector CT  imaging of the abdomen and pelvis was performed using the standard protocol following bolus administration of intravenous contrast. RADIATION DOSE REDUCTION: This exam was performed according to the departmental dose-optimization program which includes automated exposure control, adjustment of the mA and/or kV according to patient size and/or use of iterative reconstruction technique. CONTRAST:  OMNIPAQUE IOHEXOL 300 MG/ML  SOLN COMPARISON:  CT Abdomen and Pelvis 05/21/2022. FINDINGS: Lower chest: Lung bases are stable from last year with combined mild atelectasis, scarring, and also chronic postinflammatory small lung nodularity. No pericardial effusion, pleural effusion, or acute lung base opacity. Hepatobiliary: Liver and gallbladder appear stable and negative. Pancreas: Chronic pancreatic atrophy. Spleen: Negative. Adrenals/Urinary Tract: Normal adrenal glands. Kidneys are stable and nonobstructed. Small chronic benign appearing renal cysts (no follow-up imaging recommended). Symmetric renal enhancement and contrast excretion. No hydroureter. Unremarkable bladder. Stomach/Bowel: Chronic sigmoid diverticulosis, maximal in the anterior sigmoid and junction with the descending colon. That segment appears stable from the CT last year, no definite active inflammation. Mild large bowel redundancy and retained stool otherwise. Cecum located in the midline today. Elongated but normal appendix tracking to the right (series 2, image 58). No dilated small bowel. Small gastric hiatal hernia or phrenic ampulla (normal variant). Otherwise decompressed stomach and duodenum. No free air or free fluid identified. Stable chronic rectus muscle diastasis. Vascular/Lymphatic: Aortoiliac calcified atherosclerosis. Normal abdominal aortic caliber. Major arterial structures remain patent. Portal venous system is patent. No lymphadenopathy identified. Reproductive: Stable simple 3.8 cm left ovarian cyst since at least 2019 (no  follow-up imaging recommended). Diminutive or absent uterus. Negative right ovary. Other: No pelvis free fluid. Musculoskeletal: Advanced chronic spine degeneration and previous lumbar interbody fusion. Background osteopenia. 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 No acute osseous abnormality identified. But evidence of a new caudal left lateral recess  disc extrusion since last year at L2-L3 (series 2, image 36). IMPRESSION: 1. No acute traumatic injury identified. No acute or inflammatory process identified in the abdomen or pelvis. 2. 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 Query left L3 radiculitis. 3.  Aortic Atherosclerosis (ICD10-I70.0). Electronically Signed   By: Odessa Fleming M.D.   On: 02/12/2023 13:04   DG Chest Port 1 View Result Date: 02/12/2023 CLINICAL DATA:  Questionable sepsis EXAM: PORTABLE CHEST 1 VIEW COMPARISON:  10/30/2022 FINDINGS: Low volume chest. There is no edema, consolidation, effusion, or pneumothorax. Normal heart size and mediastinal contours. Artifact from EKG leads. IMPRESSION: Stable low volume chest.  No acute finding. Electronically Signed   By: Tiburcio Pea M.D.   On: 02/12/2023 11:08   DG Foot Complete Left Result Date: 02/12/2023 CLINICAL DATA:  Fall.  Questionable sepsis. EXAM: LEFT FOOT - COMPLETE 3+ VIEW COMPARISON:  10/30/2022 FINDINGS: There is no evidence of fracture or dislocation. There is no evidence of arthropathy or other focal bone abnormality. Soft tissues are unremarkable. IMPRESSION: Negative. Electronically Signed   By: Tiburcio Pea M.D.   On: 02/12/2023 11:07    Assessment/Plan  1. Primary hypertension (Primary) Bp at goal  Cont with the same  2. Edema of both lower extremities due to peripheral venous insufficiency Swelling improved  Will decrease torsemide to 40 mg daily  Will decrease potassium to  40 mg once daily from bid Will check labs bmp  3. Allergic  rhinitis, unspecified seasonality, unspecified trigger Denies sinus pain  Cont with allegra, flonase  4. Gastroesophageal reflux disease, unspecified whether esophagitis present Denies acid reflux Cont with omeprazole  5. Steroid-induced diabetes mellitus, subsequent encounter (HCC) Will check a1c  6. Rheumatoid arthritis involving multiple sites with positive rheumatoid factor (HCC) Cont with prednisone , tramadol prn for pain  Cont with OT twice a week  7. Chronic gout without tophus, unspecified cause, unspecified site Will check uric acid levels Cont with colchicine    Family/ staff Communication:  care plan discussed with the nursing staff    30 Total time spent for obtaining history,  performing a medically appropriate examination and evaluation, reviewing the tests,ordering  tests,  documenting clinical information in the electronic or other health record, independently interpreting results ,care coordination (not separately reported)

## 2023-11-27 DIAGNOSIS — E559 Vitamin D deficiency, unspecified: Secondary | ICD-10-CM | POA: Diagnosis not present

## 2023-11-27 DIAGNOSIS — I5032 Chronic diastolic (congestive) heart failure: Secondary | ICD-10-CM | POA: Diagnosis not present

## 2023-11-27 DIAGNOSIS — N178 Other acute kidney failure: Secondary | ICD-10-CM | POA: Diagnosis not present

## 2023-11-27 DIAGNOSIS — M62521 Muscle wasting and atrophy, not elsewhere classified, right upper arm: Secondary | ICD-10-CM | POA: Diagnosis not present

## 2023-11-27 DIAGNOSIS — I10 Essential (primary) hypertension: Secondary | ICD-10-CM | POA: Diagnosis not present

## 2023-11-27 DIAGNOSIS — M62522 Muscle wasting and atrophy, not elsewhere classified, left upper arm: Secondary | ICD-10-CM | POA: Diagnosis not present

## 2023-11-27 DIAGNOSIS — M6281 Muscle weakness (generalized): Secondary | ICD-10-CM | POA: Diagnosis not present

## 2023-11-27 DIAGNOSIS — M109 Gout, unspecified: Secondary | ICD-10-CM | POA: Diagnosis not present

## 2023-11-27 DIAGNOSIS — R7303 Prediabetes: Secondary | ICD-10-CM | POA: Diagnosis not present

## 2023-11-27 DIAGNOSIS — R634 Abnormal weight loss: Secondary | ICD-10-CM | POA: Diagnosis not present

## 2023-11-28 LAB — BASIC METABOLIC PANEL
BUN: 20 (ref 4–21)
CO2: 29 — AB (ref 13–22)
Chloride: 102 (ref 99–108)
Creatinine: 1.1 (ref 0.5–1.1)
Glucose: 90
Potassium: 4.4 meq/L (ref 3.5–5.1)
Sodium: 139 (ref 137–147)

## 2023-11-28 LAB — CBC AND DIFFERENTIAL
HCT: 34 — AB (ref 36–46)
Hemoglobin: 11.2 — AB (ref 12.0–16.0)
Neutrophils Absolute: 3729
Platelets: 259 10*3/uL (ref 150–400)
WBC: 8.9

## 2023-11-28 LAB — COMPREHENSIVE METABOLIC PANEL
Calcium: 9.7 (ref 8.7–10.7)
eGFR: 49

## 2023-11-28 LAB — CBC: RBC: 3.88 (ref 3.87–5.11)

## 2023-11-28 LAB — HEMOGLOBIN A1C: Hemoglobin A1C: 6.5

## 2023-11-29 DIAGNOSIS — M62521 Muscle wasting and atrophy, not elsewhere classified, right upper arm: Secondary | ICD-10-CM | POA: Diagnosis not present

## 2023-11-29 DIAGNOSIS — M62522 Muscle wasting and atrophy, not elsewhere classified, left upper arm: Secondary | ICD-10-CM | POA: Diagnosis not present

## 2023-11-29 DIAGNOSIS — M6281 Muscle weakness (generalized): Secondary | ICD-10-CM | POA: Diagnosis not present

## 2023-12-03 DIAGNOSIS — Z85828 Personal history of other malignant neoplasm of skin: Secondary | ICD-10-CM | POA: Diagnosis not present

## 2023-12-03 DIAGNOSIS — M62522 Muscle wasting and atrophy, not elsewhere classified, left upper arm: Secondary | ICD-10-CM | POA: Diagnosis not present

## 2023-12-03 DIAGNOSIS — L820 Inflamed seborrheic keratosis: Secondary | ICD-10-CM | POA: Diagnosis not present

## 2023-12-03 DIAGNOSIS — D485 Neoplasm of uncertain behavior of skin: Secondary | ICD-10-CM | POA: Diagnosis not present

## 2023-12-03 DIAGNOSIS — L570 Actinic keratosis: Secondary | ICD-10-CM | POA: Diagnosis not present

## 2023-12-03 DIAGNOSIS — M62521 Muscle wasting and atrophy, not elsewhere classified, right upper arm: Secondary | ICD-10-CM | POA: Diagnosis not present

## 2023-12-03 DIAGNOSIS — M6281 Muscle weakness (generalized): Secondary | ICD-10-CM | POA: Diagnosis not present

## 2023-12-06 DIAGNOSIS — M6281 Muscle weakness (generalized): Secondary | ICD-10-CM | POA: Diagnosis not present

## 2023-12-06 DIAGNOSIS — M62522 Muscle wasting and atrophy, not elsewhere classified, left upper arm: Secondary | ICD-10-CM | POA: Diagnosis not present

## 2023-12-06 DIAGNOSIS — M62521 Muscle wasting and atrophy, not elsewhere classified, right upper arm: Secondary | ICD-10-CM | POA: Diagnosis not present

## 2023-12-11 DIAGNOSIS — M62522 Muscle wasting and atrophy, not elsewhere classified, left upper arm: Secondary | ICD-10-CM | POA: Diagnosis not present

## 2023-12-11 DIAGNOSIS — M62521 Muscle wasting and atrophy, not elsewhere classified, right upper arm: Secondary | ICD-10-CM | POA: Diagnosis not present

## 2023-12-11 DIAGNOSIS — M6281 Muscle weakness (generalized): Secondary | ICD-10-CM | POA: Diagnosis not present

## 2023-12-14 DIAGNOSIS — M62521 Muscle wasting and atrophy, not elsewhere classified, right upper arm: Secondary | ICD-10-CM | POA: Diagnosis not present

## 2023-12-14 DIAGNOSIS — R2689 Other abnormalities of gait and mobility: Secondary | ICD-10-CM | POA: Diagnosis not present

## 2023-12-14 DIAGNOSIS — R2681 Unsteadiness on feet: Secondary | ICD-10-CM | POA: Diagnosis not present

## 2023-12-14 DIAGNOSIS — M62522 Muscle wasting and atrophy, not elsewhere classified, left upper arm: Secondary | ICD-10-CM | POA: Diagnosis not present

## 2023-12-14 DIAGNOSIS — G9341 Metabolic encephalopathy: Secondary | ICD-10-CM | POA: Diagnosis not present

## 2023-12-18 DIAGNOSIS — R2689 Other abnormalities of gait and mobility: Secondary | ICD-10-CM | POA: Diagnosis not present

## 2023-12-18 DIAGNOSIS — G9341 Metabolic encephalopathy: Secondary | ICD-10-CM | POA: Diagnosis not present

## 2023-12-18 DIAGNOSIS — M62522 Muscle wasting and atrophy, not elsewhere classified, left upper arm: Secondary | ICD-10-CM | POA: Diagnosis not present

## 2023-12-18 DIAGNOSIS — R2681 Unsteadiness on feet: Secondary | ICD-10-CM | POA: Diagnosis not present

## 2023-12-18 DIAGNOSIS — M62521 Muscle wasting and atrophy, not elsewhere classified, right upper arm: Secondary | ICD-10-CM | POA: Diagnosis not present

## 2023-12-20 ENCOUNTER — Encounter: Payer: Self-pay | Admitting: Nurse Practitioner

## 2023-12-20 ENCOUNTER — Non-Acute Institutional Stay: Payer: Self-pay | Admitting: Nurse Practitioner

## 2023-12-20 DIAGNOSIS — K219 Gastro-esophageal reflux disease without esophagitis: Secondary | ICD-10-CM | POA: Diagnosis not present

## 2023-12-20 DIAGNOSIS — E782 Mixed hyperlipidemia: Secondary | ICD-10-CM

## 2023-12-20 DIAGNOSIS — J45909 Unspecified asthma, uncomplicated: Secondary | ICD-10-CM | POA: Diagnosis not present

## 2023-12-20 DIAGNOSIS — I5032 Chronic diastolic (congestive) heart failure: Secondary | ICD-10-CM

## 2023-12-20 DIAGNOSIS — R7303 Prediabetes: Secondary | ICD-10-CM | POA: Diagnosis not present

## 2023-12-20 DIAGNOSIS — E876 Hypokalemia: Secondary | ICD-10-CM | POA: Diagnosis not present

## 2023-12-20 DIAGNOSIS — M5116 Intervertebral disc disorders with radiculopathy, lumbar region: Secondary | ICD-10-CM | POA: Diagnosis not present

## 2023-12-20 DIAGNOSIS — K2900 Acute gastritis without bleeding: Secondary | ICD-10-CM

## 2023-12-20 DIAGNOSIS — Z86718 Personal history of other venous thrombosis and embolism: Secondary | ICD-10-CM

## 2023-12-20 DIAGNOSIS — I872 Venous insufficiency (chronic) (peripheral): Secondary | ICD-10-CM

## 2023-12-20 DIAGNOSIS — E538 Deficiency of other specified B group vitamins: Secondary | ICD-10-CM

## 2023-12-20 DIAGNOSIS — M8580 Other specified disorders of bone density and structure, unspecified site: Secondary | ICD-10-CM

## 2023-12-20 DIAGNOSIS — Z8719 Personal history of other diseases of the digestive system: Secondary | ICD-10-CM | POA: Diagnosis not present

## 2023-12-20 DIAGNOSIS — M1A9XX0 Chronic gout, unspecified, without tophus (tophi): Secondary | ICD-10-CM

## 2023-12-20 DIAGNOSIS — M0579 Rheumatoid arthritis with rheumatoid factor of multiple sites without organ or systems involvement: Secondary | ICD-10-CM

## 2023-12-20 NOTE — Progress Notes (Signed)
 Location:  [facility] Room Number: 925-A Place of Service:  ALF [phone]) Provider:  Jasmyne Lodato X,NP  Zacharias Ridling X, NP  Patient Care Team: Ereka Brau X, [name]  Extended Emergency Contact Information Primary Emergency Contact: Debrew,Jacqueline  [place] Home Phone: [phone] Mobile Phone: [phone] Relation: Daughter  Code Status:  FULL Goals of care: Advanced Directive information    12/21/2023    8:00 PM  Advanced Directives  Does Patient Have a Medical Advance Directive? Yes  Type of Estate Agent of [place];Living will  Does patient want to make changes to medical advance directive? No - Patient declined  Copy of Healthcare Power of Attorney in Chart? Yes - validated most recent copy scanned in chart (See row information)     Chief Complaint  Patient presents with   Acute Visit    Nausea & vomiting.    HPI:  Pt is a 89 y.o. female seen today for an acute visit for nausea, vomiting, and diarrhea x 1 days, afebrile, limited oral intake, c/o abd discomfort.     Chronic diastolic heart failure, DOE, on and off wheezing, cough, occasional yellow phlegm in am. CXR no evidence of acute infection 10/2022. 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 Diclofenac , MRI completed, Neurosurgery consulted, not a good surgical candidate.                           Hgb a1c 6.5 12/21/23,  prediabetic, on Steroids, monitor CBG             The right middle finger stiffness, difficulty eating.  Hypokalemia, K 3.2 12/21/23,  on Kcl             Hx of sinus tachycardia, heart rate is in control.              Hx of aute sigmoid diverticulitis with pneumoperitoneum, f/u Dr. Stevie, diarrhea on and off, negative C-diff while in the past.              Asthma Epinephrine  prn,  Allegra , Flonase , Singulair              RA/fibromyalgia, taking steroids, and Tramadol , TSH 0.865 02/27/22             GERD off acid reducer. Hgb 13.71/10/25             Venous insufficiency/Edema , takes Torsemide, Kcl.             OP Rheumatology, off Alendronate , on Vit D, t score -1.8 06/06/21             Hyperlipidemia, takes Atorvastatin , LDL 77 06/05/23             Vit B12 deficiency, on Vit B12 po, Vit B12 235 06/20/21             CT left adnexa cyst 11/2020, MRI 05/08/21,  05/22/22 renal US  shoed No evidence of renal mass or hydronephrosis.             Gout, R ankle, stable, on Colchicine              Hx of DVT, venous US  BLE was negative for DVT while in hospital. Off Eliquis .              HTN, on Amlodipine .  Bun/creat 17/1.3 12/21/23    Past Medical History:  Diagnosis Date   Adrenal failure (HCC)    Arthritis    Asthma    Cancer (HCC)    Cataract    Closed nondisplaced fracture of fifth right metatarsal bone 09/18/2017   Diverticulitis    Per patient   E coli bacteremia    Fibromyalgia 2008   GERD (gastroesophageal reflux disease) 12/18/2016   HCAP (healthcare-associated pneumonia) 02/03/2018   Hyperlipidemia 10/13/2021   Osteoporosis    RA (rheumatoid arthritis) (HCC)    Recurrent upper respiratory infection (URI)    Sepsis due to urinary tract infection (HCC) 01/18/2018   Urticaria    Past Surgical History:  Procedure Laterality Date   BACK SURGERY     BILATERAL CARPAL TUNNEL RELEASE  2005   right and left   CATARACT EXTRACTION, BILATERAL  2004   right and left   CERVICAL FUSION  2011,2010,2008   2 disks   HEEL SPUR SURGERY  2004   lower back fusion  2011   Fusion of 3-4 and 4-5 lower back   RADIOFREQUENCY ABLATION  2020   ROTATOR CUFF REPAIR  [id number]   SQUAMOUS CELL CARCINOMA EXCISION     TONSILLECTOMY AND ADENOIDECTOMY  1947   TOTAL SHOULDER ARTHROPLASTY      Allergies  Allergen Reactions   Adhesive [Tape] Rash   Celebrex [Celecoxib] Hives    Ciprofibrate Nausea Only   Cymbalta [Duloxetine Hcl] Swelling   Gabitril [Tiagabine] Swelling   Lyrica [Pregabalin] Swelling   Neurontin [Gabapentin] Swelling   Nexium  [Esomeprazole ] Rash   Nsaids Rash   Shrimp [Shellfish Allergy] Anaphylaxis    Per patient shrimp only   Azactam  [Aztreonam ]     Hand swelling    Azelastine  Hcl     Rash    Ciprofloxacin  Other (See Comments)    dizziness   Claritin  [Loratadine ]     Irritability Nervousness    Methotrexate  Derivatives    Nasacort  [Triamcinolone ]     Dizzy    Olopatadine  Other (See Comments)    Pain and lethargy    Other    Sulfamethizole Other (See Comments)    unknown   Zantac [Ranitidine Hcl] Other (See Comments)    unknown   Claritin -D 12 Hour [Loratadine -Pseudoephedrine Er] Anxiety   Keflex  [Cephalexin ] Nausea And Vomiting    Tolerated Ancef    Penicillins Rash    Injection site reaction. Tolerated cefepime  in past. Also reports tolerating a penicillin infusion after this initial rxn ~20 yrs ago.  Has patient had a PCN reaction causing immediate rash, facial/tongue/throat swelling, SOB or lightheadedness with hypotension: No Has patient had a PCN reaction causing severe rash involving mucus membranes or skin necrosis: No Has patient had a PCN reaction that required hospitalization No Has patient had a PCN reaction occurring within the last 10 years: No     Sulfa Antibiotics Nausea And Vomiting    No facility-administered encounter medications on file as of 12/20/2023.   Outpatient Encounter Medications as of 12/20/2023  Medication Sig   acetaminophen  (TYLENOL ) 500 MG tablet Take 650 mg by mouth in the morning and at bedtime.   amLODipine  (NORVASC ) 5 MG tablet Take 5 mg by mouth daily.   atorvastatin  (LIPITOR) 20 MG tablet TAKE 1 TABLET BY MOUTH EVERY DAY   Biotin  5 MG TBDP Take 1 tablet (5 mg total) by mouth daily.   Cholecalciferol  (VITAMIN D3) 125 MCG (5000 UT) CAPS TAKE 1 CAPSULE BY MOUTH EVERY DAY   colchicine   0.6  MG tablet TAKE 1 TABLET BY MOUTH EVERY DAY   CRANBERRY PO Take 1 capsule by mouth 2 (two) times daily.   cycloSPORINE  (RESTASIS ) 0.05 % ophthalmic emulsion Place 1 drop into both eyes 2 (two) times daily.   D-Mannose 500 MG CAPS Take 500 mg by mouth daily.   diclofenac  Sodium (VOLTAREN ) 1 % GEL APPLY 2 TO 4 GRAMS TOPICALLY TO AFFECTED JOINTS UP TO 4 TIMES DAILY   fexofenadine  (ALLEGRA ) 180 MG tablet Take 1 tablet (180 mg total) by mouth daily.   ipratropium-albuterol  (DUONEB) 0.5-2.5 (3) MG/3ML SOLN Take 3 mLs by nebulization as needed.   lansoprazole  (PREVACID ) 30 MG capsule TAKE 1 CAPSULE BY MOUTH ONCE DAILY AT NOON (Patient taking differently: Take 30 mg by mouth daily in the afternoon.)   montelukast  (SINGULAIR ) 10 MG tablet TAKE 1 TABLET BY MOUTH EVERYDAY AT BEDTIME (Patient taking differently: Take 10 mg by mouth at bedtime.)   ondansetron  (ZOFRAN ) 4 MG tablet Take 4 mg by mouth as needed for nausea or vomiting.   potassium chloride  SA (KLOR-CON  M) 20 MEQ tablet Take 40 mEq by mouth 2 (two) times daily.   predniSONE  (DELTASONE ) 5 MG tablet Take 5 mg by mouth daily with breakfast.   traMADol  (ULTRAM ) 50 MG tablet Take 1 tablet (50 mg total) by mouth every 6 (six) hours as needed (pain).   [DISCONTINUED] Torsemide 40 MG TABS Take 1 tablet by mouth 2 (two) times daily. (Patient not taking: Reported on 12/21/2023)   saccharomyces boulardii (FLORASTOR) 250 MG capsule Take 250 mg by mouth 2 (two) times daily.   Torsemide 40 MG TABS Take 40 mg by mouth daily.   [DISCONTINUED] ketoconazole  (NIZORAL ) 2 % shampoo APPLY TOPICALLY ONCE A WEEK (Patient not taking: Reported on 11/02/2023)   [DISCONTINUED] nystatin  ointment (MYCOSTATIN ) 100000 UNIT/GM (Nystatin  (Topical)) Apply to bi later breast and groin topically two times a day for rash for 2 Weeks (Patient not taking: Reported on 11/02/2023)    Review of Systems  Constitutional:  Positive for appetite change and fatigue. Negative for fever.  HENT:   Negative for congestion, sinus pressure and trouble swallowing.   Eyes:  Negative for visual disturbance.  Respiratory:  Positive for shortness of breath. Negative for cough, chest tightness and wheezing.        DOE, occasional yellow phlegm in am, hacking cough.   Cardiovascular:  Positive for leg swelling.  Gastrointestinal:  Positive for abdominal pain, diarrhea, nausea and vomiting.       On and off diarrhea.   Genitourinary:  Negative for dysuria, frequency and urgency.       Incontinent of urine.   Musculoskeletal:  Positive for arthralgias, back pain, gait problem and myalgias.       Right lower back hip pain, travels down to the right leg. Left groin/hip region pain with movement, able to walk if Tramadol  use.   Skin:  Negative for color change.       Right intergluteal cleft scabbed over area.   Neurological:  Negative for speech difficulty, weakness and headaches.  Psychiatric/Behavioral:  Negative for confusion and sleep disturbance. The patient is not nervous/anxious.     Immunization History  Administered Date(s) Administered   Fluad Quad(high Dose 65+) 09/30/2020   Influenza, High Dose Seasonal PF 08/16/2018, 09/08/2019, 09/24/2020, 10/04/2021, 09/12/2023   Influenza,inj,Quad PF,6+ Mos 08/11/2016   Influenza-Unspecified 11/12/2017, 10/05/2022   Moderna Covid-19 Vaccine Bivalent Booster 20yrs & up 10/04/2021, 09/26/2023   Moderna Sars-Covid-2 Vaccination 12/15/2019, 01/12/2020, 10/19/2020  PNEUMOCOCCAL CONJUGATE-20 10/04/2021   PPD Test 03/01/2018   Pneumococcal Conjugate-13 06/26/2017   Pneumococcal Polysaccharide-23 12/11/2009   Tdap 04/04/2023   Zoster Recombinant(Shingrix) 01/28/2019, 09/02/2019   Pertinent  Health Maintenance Due  Topic Date Due   INFLUENZA VACCINE  Completed   DEXA SCAN  Completed      01/31/2023    1:08 PM 02/19/2023    2:29 PM 03/06/2023   11:57 AM 03/13/2023   10:48 AM 03/22/2023   10:53 AM  Fall Risk  Falls in the past year? 0 1 0 0 0   Was there an injury with Fall? 0 1 0 0 0  Fall Risk Category Calculator 0 3 0 0 0  Patient at Risk for Falls Due to Impaired balance/gait;Impaired mobility History of fall(s);Impaired balance/gait History of fall(s);Impaired balance/gait History of fall(s);Impaired balance/gait History of fall(s);Impaired balance/gait  Fall risk Follow up Falls evaluation completed Falls evaluation completed Falls evaluation completed Falls evaluation completed Falls evaluation completed   Functional Status Survey:    Vitals:   12/20/23 1637  BP: 126/61  Pulse: 64  Resp: 18  Temp: 97.7 F (36.5 C)  SpO2: 93%  Weight: 162 lb (73.5 kg)  Height: 5' 4 (1.626 m)   Body mass index is 27.81 kg/m. Physical Exam Vitals and nursing note reviewed.  Constitutional:      Comments: weak  HENT:     Head: Normocephalic and atraumatic.     Nose: Nose normal.     Mouth/Throat:     Mouth: Mucous membranes are moist.  Eyes:     Extraocular Movements: Extraocular movements intact.     Conjunctiva/sclera: Conjunctivae normal.     Pupils: Pupils are equal, round, and reactive to light.  Cardiovascular:     Rate and Rhythm: Normal rate and regular rhythm.     Heart sounds: No murmur heard. Pulmonary:     Effort: Pulmonary effort is normal.     Breath sounds: No rales.  Abdominal:     General: Bowel sounds are normal.     Palpations: Abdomen is soft.     Tenderness: There is abdominal tenderness. There is no right CVA tenderness, left CVA tenderness, guarding or rebound.  Musculoskeletal:        General: No tenderness.     Cervical back: Normal range of motion and neck supple.     Right lower leg: Edema present.     Left lower leg: Edema present.     Comments: trace edema BLE  Skin:    General: Skin is warm and dry.     Comments: Lots of moles. Chronic erythema venous insufficiency skin changes BLE. Scabbed area right intergluteal cleft.   Neurological:     General: No focal deficit present.      Mental Status: She is alert and oriented to person, place, and time. Mental status is at baseline.     Gait: Gait abnormal.  Psychiatric:        Mood and Affect: Mood normal.        Behavior: Behavior normal.        Thought Content: Thought content normal.        Judgment: Judgment normal.     Labs reviewed: Recent Labs    12/21/23 0900 12/21/23 1639 12/22/23 0338 12/24/23 0346  NA 141  --  143 140  K 3.2*  --  3.8 3.2*  CL 107  --  111 113*  CO2 25  --  23 23  GLUCOSE 107*  --  117* 85  BUN 17  --  15 15  CREATININE 1.30* 1.07* 0.96 0.84  CALCIUM  8.8*  --  8.2* 8.8*  MG  --  1.6*  --  2.3  PHOS  --  3.6  --   --    Recent Labs    02/12/23 1120 02/20/23 0000 12/21/23 0900 12/22/23 0338  AST 26 12* 50* 45*  ALT 15 16 28 26   ALKPHOS 61 53 75 57  BILITOT 1.4*  --  0.8 0.6  PROT 7.9  --  7.0 5.2*  ALBUMIN  3.8 3.5 3.3* 2.5*   Recent Labs    11/28/23 0000 12/21/23 0900 12/21/23 1639 12/22/23 0338 12/24/23 0346  WBC 8.9 9.9 8.0 7.5 8.1  NEUTROABS 3,729.00 6.8  --   --  3.5  HGB 11.2* 13.7 11.3* 10.6* 10.3*  HCT 34* 44.7 34.8* 34.1* 32.8*  MCV  --  94.1 91.6 93.9 92.4  PLT 259 226 190 169 156   Lab Results  Component Value Date   TSH 0.865 02/27/2022   Lab Results  Component Value Date   HGBA1C 6.5 11/28/2023   Lab Results  Component Value Date   CHOL 151 06/05/2023   HDL 47 06/05/2023   LDLCALC 77 06/05/2023   TRIG 174 (A) 06/05/2023   CHOLHDL 2.6 04/06/2020    Significant Diagnostic Results in last 30 days:  ECHOCARDIOGRAM COMPLETE Result Date: 12/21/2023    ECHOCARDIOGRAM REPORT   Patient Name:   KRISTALYN BERGSTRESSER Date of Exam: 12/21/2023 Medical Rec #:  [id number]          Height:       64.0 in Accession #:    [id number]         Weight:       162.0 lb Date of Birth:  March 25, 1932          BSA:          1.789 m Patient Age:    89 years           BP:           97/58 mmHg Patient Gender: F                  HR:           76 bpm. Exam Location:  Inpatient  Procedure: 2D Echo, Cardiac Doppler and Color Doppler STAT ECHO Indications:    Shock  History:        Patient has prior history of Echocardiogram examinations, most                 recent 05/22/2022. CHF, Arrythmias:Tachycardia,                 Signs/Symptoms:Shortness of Breath; Risk Factors:Dyslipidemia                 and Hypertension.  Sonographer:    Juanita Shaw Referring Phys: [id number] JONATHAN B DEWALD IMPRESSIONS  1. Left ventricular ejection fraction, by estimation, is 55 to 60%. The left ventricle has normal function. The left ventricle has no regional wall motion abnormalities. Left ventricular diastolic parameters are consistent with Grade I diastolic dysfunction (impaired relaxation).  2. Right ventricular systolic function is normal. The right ventricular size is normal. Tricuspid regurgitation signal is inadequate for assessing PA pressure.  3. The mitral valve is normal in structure. No evidence of mitral valve regurgitation. No evidence of mitral stenosis.  4. The aortic valve is tricuspid. There is mild calcification of  the aortic valve. Aortic valve regurgitation is not visualized. No aortic stenosis is present.  5. The inferior vena cava is normal in size with greater than 50% respiratory variability, suggesting right atrial pressure of 3 mmHg. FINDINGS  Left Ventricle: Left ventricular ejection fraction, by estimation, is 55 to 60%. The left ventricle has normal function. The left ventricle has no regional wall motion abnormalities. The left ventricular internal cavity size was normal in size. There is  no left ventricular hypertrophy. Left ventricular diastolic parameters are consistent with Grade I diastolic dysfunction (impaired relaxation). Right Ventricle: The right ventricular size is normal. No increase in right ventricular wall thickness. Right ventricular systolic function is normal. Tricuspid regurgitation signal is inadequate for assessing PA pressure. Left Atrium: Left atrial size  was normal in size. Right Atrium: Right atrial size was normal in size. Pericardium: There is no evidence of pericardial effusion. Mitral Valve: The mitral valve is normal in structure. No evidence of mitral valve regurgitation. No evidence of mitral valve stenosis. MV peak gradient, 2.5 mmHg. The mean mitral valve gradient is 1.0 mmHg. Tricuspid Valve: The tricuspid valve is normal in structure. Tricuspid valve regurgitation is not demonstrated. Aortic Valve: The aortic valve is tricuspid. There is mild calcification of the aortic valve. Aortic valve regurgitation is not visualized. No aortic stenosis is present. Aortic valve mean gradient measures 6.0 mmHg. Aortic valve peak gradient measures 11.4 mmHg. Aortic valve area, by VTI measures 2.06 cm. Pulmonic Valve: The pulmonic valve was normal in structure. Pulmonic valve regurgitation is not visualized. Aorta: The aortic root is normal in size and structure. Venous: The inferior vena cava is normal in size with greater than 50% respiratory variability, suggesting right atrial pressure of 3 mmHg. IAS/Shunts: No atrial level shunt detected by color flow Doppler.  LEFT VENTRICLE PLAX 2D LVIDd:         3.90 cm      Diastology LVIDs:         3.00 cm      LV e' medial:    6.85 cm/s LV PW:         0.70 cm      LV E/e' medial:  9.8 LV IVS:        0.80 cm      LV e' lateral:   8.05 cm/s LVOT diam:     1.80 cm      LV E/e' lateral: 8.3 LV SV:         64 LV SV Index:   36 LVOT Area:     2.54 cm  LV Volumes (MOD) LV vol d, MOD A2C: 104.0 ml LV vol d, MOD A4C: 112.0 ml LV vol s, MOD A2C: 34.3 ml LV vol s, MOD A4C: 50.3 ml LV SV MOD A2C:     69.7 ml LV SV MOD A4C:     112.0 ml LV SV MOD BP:      67.4 ml RIGHT VENTRICLE             IVC RV Basal diam:  3.80 cm     IVC diam: 1.70 cm RV Mid diam:    3.70 cm RV S prime:     13.50 cm/s TAPSE (M-mode): 2.6 cm LEFT ATRIUM             Index        RIGHT ATRIUM          Index LA diam:        3.10 cm 1.73 cm/m  RA Area:     9.11 cm LA  Vol (A2C):   22.4 ml 12.52 ml/m  RA Volume:   17.40 ml 9.73 ml/m LA Vol (A4C):   25.1 ml 14.03 ml/m LA Biplane Vol: 25.9 ml 14.48 ml/m  AORTIC VALVE                     PULMONIC VALVE AV Area (Vmax):    2.02 cm      PV Vmax:       0.78 m/s AV Area (Vmean):   1.51 cm      PV Peak grad:  2.4 mmHg AV Area (VTI):     2.06 cm AV Vmax:           169.00 cm/s AV Vmean:          119.000 cm/s AV VTI:            0.309 m AV Peak Grad:      11.4 mmHg AV Mean Grad:      6.0 mmHg LVOT Vmax:         134.00 cm/s LVOT Vmean:        70.800 cm/s LVOT VTI:          0.250 m LVOT/AV VTI ratio: 0.81  AORTA Ao Root diam: 2.80 cm Ao Asc diam:  2.90 cm MITRAL VALVE MV Area (PHT): 3.12 cm    SHUNTS MV Area VTI:   2.37 cm    Systemic VTI:  0.25 m MV Peak grad:  2.5 mmHg    Systemic Diam: 1.80 cm MV Mean grad:  1.0 mmHg MV Vmax:       0.79 m/s MV Vmean:      51.8 cm/s MV Decel Time: 243 msec MV E velocity: 67.00 cm/s MV A velocity: 62.90 cm/s MV E/A ratio:  1.07 Dalton McleanMD Electronically signed by Ezra Kanner Signature Date/Time: 12/21/2023/3:10:49 PM    Final    DG Hip Unilat W or Wo Pelvis 2-3 Views Left Result Date: 12/21/2023 CLINICAL DATA:  Left hip pain. EXAM: DG HIP (WITH OR WITHOUT PELVIS) 2-3V LEFT COMPARISON:  None Available. FINDINGS: There is no acute fracture or dislocation.Evaluation for fracture is limited due to osteopenia and body habitus. There is moderate left and severe right hip osteoarthritic changes. There is near complete loss of right hip joint space with near bone on bone contact. Excreted contrast noted in the urinary bladder. The soft tissues are unremarkable. IMPRESSION: 1. No acute fracture or dislocation. 2. Moderate left and severe right hip osteoarthritis. Electronically Signed   By: Vanetta Chou M.D.   On: 12/21/2023 12:38   CT Angio Chest PE W and/or Wo Contrast Result Date: 12/21/2023 CLINICAL DATA:  Pulmonary embolism (PE) suspected, high prob; Epigastric pain sepsis. Nausea and  lethargy. EXAM: CT ANGIOGRAPHY CHEST CT ABDOMEN AND PELVIS WITH CONTRAST TECHNIQUE: Multidetector CT imaging of the chest was performed using the standard protocol during bolus administration of intravenous contrast. Multiplanar CT image reconstructions and MIPs were obtained to evaluate the vascular anatomy. Multidetector CT imaging of the abdomen and pelvis was performed using the standard protocol during bolus administration of intravenous contrast. RADIATION DOSE REDUCTION: This exam was performed according to the departmental dose-optimization program which includes automated exposure control, adjustment of the mA and/or kV according to patient size and/or use of iterative reconstruction technique. CONTRAST:  75mL OMNIPAQUE  IOHEXOL  350 MG/ML SOLN COMPARISON:  CT scan abdomen and pelvis from 02/12/2023. FINDINGS: CTA CHEST FINDINGS Cardiovascular: No  evidence of embolism to the proximal subsegmental pulmonary artery level. Normal cardiac size. No pericardial effusion. No aortic aneurysm. There are coronary artery calcifications, in keeping with coronary artery disease. Mediastinum/Nodes: Visualized thyroid  gland appears grossly unremarkable. No solid / cystic mediastinal masses. The esophagus is nondistended precluding optimal assessment. There are few mildly prominent mediastinal and hilar lymph nodes, which do not meet the size criteria for lymphadenopathy. No axillary lymphadenopathy by size criteria. Lungs/Pleura: The central tracheo-bronchial tree is patent. There is mild, smooth, circumferential thickening of the segmental and subsegmental bronchial walls, throughout bilateral lungs, which is nonspecific. Findings are most commonly seen with bronchitis or reactive airway disease, such as asthma. There are occlusive and nonocclusive filling defects in the distal segmental and subsegmental bronchi, throughout bilateral lungs, likely due to mucus/secretions or aspiration. There are multiple solid noncalcified  nodules throughout bilateral lungs with largest in the anterior left upper lobe measuring up to 1.8 x 1.8 cm. This nodule exhibits small central cavitation. Rest of the nodules are smaller in size and do not exhibit cavitations. These are compatible with metastases. No consolidation, pleural effusion or pneumothorax. There are dependent changes in bilateral lungs. Musculoskeletal: The visualized soft tissues of the chest wall are grossly unremarkable. No suspicious osseous lesions. There are mild multilevel degenerative changes in the visualized spine. Review of the MIP images confirms the above findings. CT ABDOMEN and PELVIS FINDINGS Hepatobiliary: The liver is normal in size. Non-cirrhotic configuration. No suspicious mass. No intrahepatic or extrahepatic bile duct dilation. No calcified gallstones. Normal gallbladder wall thickness. No pericholecystic inflammatory changes. Pancreas: Unremarkable. No pancreatic ductal dilatation or surrounding inflammatory changes. Spleen: Within normal limits. No focal lesion. Adrenals/Urinary Tract: Adrenal glands are unremarkable. No suspicious renal mass. There is a 2.3 x 2.4 cm simple cyst in the left kidney interpolar region, anteriorly. There are additional several smaller cysts throughout bilateral kidneys. No hydronephrosis. No renal or ureteric calculi. Unremarkable urinary bladder. Stomach/Bowel: There is a small sliding hiatal hernia. No disproportionate dilation of the small or large bowel loops. No evidence of abnormal bowel wall thickening or inflammatory changes. The appendix is unremarkable. There are scattered diverticula mainly in the sigmoid colon, without imaging signs of diverticulitis. Vascular/Lymphatic: No ascites or pneumoperitoneum. No abdominal or pelvic lymphadenopathy, by size criteria. No aneurysmal dilation of the major abdominal arteries. There are mild peripheral atherosclerotic vascular calcifications of the aorta and its major branches.  Reproductive: The uterus is unremarkable. There is a 3.5 x 4.3 cm hypoattenuating structure in the left adnexa, most likely ovarian in etiology. The structure exhibits fluid attenuation and has no discrete mural nodule. Even though this is incompletely characterized on the current exam, favored to represent a senescent ovarian cyst. Annual follow-up ultrasound evaluation is recommended to documenting stability. Other: There is a tiny fat containing umbilical hernia. The soft tissues and abdominal wall are otherwise unremarkable. Musculoskeletal: No suspicious osseous lesions. There are moderate multilevel degenerative changes in the visualized spine. Review of the MIP images confirms the above findings. IMPRESSION: 1. No embolism to the proximal subsegmental pulmonary artery level. 2. There are multiple solid noncalcified lung nodules, compatible with metastases. 3. No acute inflammatory process identified within the abdomen or pelvis. 4. Multiple other nonacute observations, as described above. Electronically Signed   By: Ree Molt M.D.   On: 12/21/2023 11:18   CT ABDOMEN PELVIS W CONTRAST Result Date: 12/21/2023 CLINICAL DATA:  Pulmonary embolism (PE) suspected, high prob; Epigastric pain sepsis. Nausea and lethargy. EXAM: CT ANGIOGRAPHY CHEST  CT ABDOMEN AND PELVIS WITH CONTRAST TECHNIQUE: Multidetector CT imaging of the chest was performed using the standard protocol during bolus administration of intravenous contrast. Multiplanar CT image reconstructions and MIPs were obtained to evaluate the vascular anatomy. Multidetector CT imaging of the abdomen and pelvis was performed using the standard protocol during bolus administration of intravenous contrast. RADIATION DOSE REDUCTION: This exam was performed according to the departmental dose-optimization program which includes automated exposure control, adjustment of the mA and/or kV according to patient size and/or use of iterative reconstruction technique.  CONTRAST:  75mL OMNIPAQUE  IOHEXOL  350 MG/ML SOLN COMPARISON:  CT scan abdomen and pelvis from 02/12/2023. FINDINGS: CTA CHEST FINDINGS Cardiovascular: No evidence of embolism to the proximal subsegmental pulmonary artery level. Normal cardiac size. No pericardial effusion. No aortic aneurysm. There are coronary artery calcifications, in keeping with coronary artery disease. Mediastinum/Nodes: Visualized thyroid  gland appears grossly unremarkable. No solid / cystic mediastinal masses. The esophagus is nondistended precluding optimal assessment. There are few mildly prominent mediastinal and hilar lymph nodes, which do not meet the size criteria for lymphadenopathy. No axillary lymphadenopathy by size criteria. Lungs/Pleura: The central tracheo-bronchial tree is patent. There is mild, smooth, circumferential thickening of the segmental and subsegmental bronchial walls, throughout bilateral lungs, which is nonspecific. Findings are most commonly seen with bronchitis or reactive airway disease, such as asthma. There are occlusive and nonocclusive filling defects in the distal segmental and subsegmental bronchi, throughout bilateral lungs, likely due to mucus/secretions or aspiration. There are multiple solid noncalcified nodules throughout bilateral lungs with largest in the anterior left upper lobe measuring up to 1.8 x 1.8 cm. This nodule exhibits small central cavitation. Rest of the nodules are smaller in size and do not exhibit cavitations. These are compatible with metastases. No consolidation, pleural effusion or pneumothorax. There are dependent changes in bilateral lungs. Musculoskeletal: The visualized soft tissues of the chest wall are grossly unremarkable. No suspicious osseous lesions. There are mild multilevel degenerative changes in the visualized spine. Review of the MIP images confirms the above findings. CT ABDOMEN and PELVIS FINDINGS Hepatobiliary: The liver is normal in size. Non-cirrhotic  configuration. No suspicious mass. No intrahepatic or extrahepatic bile duct dilation. No calcified gallstones. Normal gallbladder wall thickness. No pericholecystic inflammatory changes. Pancreas: Unremarkable. No pancreatic ductal dilatation or surrounding inflammatory changes. Spleen: Within normal limits. No focal lesion. Adrenals/Urinary Tract: Adrenal glands are unremarkable. No suspicious renal mass. There is a 2.3 x 2.4 cm simple cyst in the left kidney interpolar region, anteriorly. There are additional several smaller cysts throughout bilateral kidneys. No hydronephrosis. No renal or ureteric calculi. Unremarkable urinary bladder. Stomach/Bowel: There is a small sliding hiatal hernia. No disproportionate dilation of the small or large bowel loops. No evidence of abnormal bowel wall thickening or inflammatory changes. The appendix is unremarkable. There are scattered diverticula mainly in the sigmoid colon, without imaging signs of diverticulitis. Vascular/Lymphatic: No ascites or pneumoperitoneum. No abdominal or pelvic lymphadenopathy, by size criteria. No aneurysmal dilation of the major abdominal arteries. There are mild peripheral atherosclerotic vascular calcifications of the aorta and its major branches. Reproductive: The uterus is unremarkable. There is a 3.5 x 4.3 cm hypoattenuating structure in the left adnexa, most likely ovarian in etiology. The structure exhibits fluid attenuation and has no discrete mural nodule. Even though this is incompletely characterized on the current exam, favored to represent a senescent ovarian cyst. Annual follow-up ultrasound evaluation is recommended to documenting stability. Other: There is a tiny fat containing umbilical hernia. The soft  tissues and abdominal wall are otherwise unremarkable. Musculoskeletal: No suspicious osseous lesions. There are moderate multilevel degenerative changes in the visualized spine. Review of the MIP images confirms the above  findings. IMPRESSION: 1. No embolism to the proximal subsegmental pulmonary artery level. 2. There are multiple solid noncalcified lung nodules, compatible with metastases. 3. No acute inflammatory process identified within the abdomen or pelvis. 4. Multiple other nonacute observations, as described above. Electronically Signed   By: Ree Molt M.D.   On: 12/21/2023 11:18   DG Chest Port 1 View Result Date: 12/21/2023 CLINICAL DATA:  89 year old female with possible sepsis. EXAM: PORTABLE CHEST 1 VIEW COMPARISON:  Portable chest 02/12/2023 and earlier. FINDINGS: Portable AP semi upright view at 0844 hours. Low lung volumes not significantly changed from last year. Stable cardiac size and mediastinal contours. Visualized tracheal air column is within normal limits. Allowing for portable technique the lungs are clear. Chronic cervical ACDF. Stable visualized osseous structures. Negative visible bowel gas. IMPRESSION: Stable with chronically low lung volumes. No acute cardiopulmonary abnormality. Electronically Signed   By: VEAR Hurst M.D.   On: 12/21/2023 09:02    Assessment/Plan Acute gastritis nausea, vomiting, and diarrhea x 1 days, afebrile, limited oral intake, c/o abd discomfort.  CBC/diff, CMP/eGFR Hold Torsemide/Kcl x 5 days Zofran  4mg  tid with meals x72hrs, then prn x 10 days Encourage oral fluid intake VS qshift x 72hrs.   Chronic diastolic heart failure (HCC) Chronic diastolic heart failure, DOE, on and off wheezing, cough, occasional yellow phlegm in am. CXR no evidence of acute infection 10/2022. 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 Diclofenac , MRI completed, Neurosurgery consulted, not a good surgical candidate.   Prediabetes  Hgb a1c 6.5 12/21/23,   prediabetic, on Steroids, monitor CBG  Hypokalemia  K 3.2 12/21/23,  on Kcl  History of diverticulitis Hx of aute sigmoid diverticulitis with pneumoperitoneum, f/u Dr. Stevie, diarrhea on and off, negative C-diff while in the past.   Asthma  Epinephrine  prn, Allegra , Flonase , Singulair   Rheumatoid arthritis (HCC) RA/fibromyalgia, taking steroids, and Tramadol , TSH 0.865 02/27/22  GERD (gastroesophageal reflux disease) off acid reducer. Hgb 13.71/10/25  Edema of both lower extremities due to peripheral venous insufficiency  Venous insufficiency/Edema ,  Torsemide, Kcl on hold 2/2 acute gastritis.   Osteopenia  Rheumatology, off Alendronate , on Vit D, t score -1.8 06/06/21  Hyperlipidemia takes Atorvastatin , LDL 77 06/05/23  B12 deficiency Vit B12 deficiency, on Vit B12 po, Vit B12 235 06/20/21  Gout R ankle, stable, on Colchicine   History of DVT (deep vein thrombosis) Hx of DVT, venous US  BLE was negative for DVT while in hospital. Off Eliquis .   HTN (hypertension) Blood pressure is controlled, on Amlodipine . Bun/creat 17/1.3 12/21/23     Family/ staff Communication: plan of care reviewed with the patient and charge nurse.   Labs/tests ordered: CBC/diff, CMP/eGFR  Time spend 40 minutes.

## 2023-12-21 ENCOUNTER — Other Ambulatory Visit: Payer: Self-pay

## 2023-12-21 ENCOUNTER — Emergency Department (HOSPITAL_COMMUNITY): Payer: Medicare PPO

## 2023-12-21 ENCOUNTER — Inpatient Hospital Stay (HOSPITAL_COMMUNITY): Payer: Medicare PPO

## 2023-12-21 ENCOUNTER — Inpatient Hospital Stay (HOSPITAL_COMMUNITY)
Admission: EM | Admit: 2023-12-21 | Discharge: 2023-12-26 | DRG: 871 | Disposition: A | Discharge status: Skilled Nursing Facility | Payer: Medicare PPO | Source: Skilled Nursing Facility | Attending: Internal Medicine | Admitting: Internal Medicine

## 2023-12-21 DIAGNOSIS — R0989 Other specified symptoms and signs involving the circulatory and respiratory systems: Secondary | ICD-10-CM | POA: Diagnosis not present

## 2023-12-21 DIAGNOSIS — R0902 Hypoxemia: Secondary | ICD-10-CM

## 2023-12-21 DIAGNOSIS — Z881 Allergy status to other antibiotic agents status: Secondary | ICD-10-CM

## 2023-12-21 DIAGNOSIS — Z91048 Other nonmedicinal substance allergy status: Secondary | ICD-10-CM

## 2023-12-21 DIAGNOSIS — R6520 Severe sepsis without septic shock: Secondary | ICD-10-CM | POA: Diagnosis not present

## 2023-12-21 DIAGNOSIS — Z515 Encounter for palliative care: Secondary | ICD-10-CM | POA: Diagnosis not present

## 2023-12-21 DIAGNOSIS — R6521 Severe sepsis with septic shock: Secondary | ICD-10-CM | POA: Diagnosis not present

## 2023-12-21 DIAGNOSIS — E669 Obesity, unspecified: Secondary | ICD-10-CM | POA: Diagnosis present

## 2023-12-21 DIAGNOSIS — R571 Hypovolemic shock: Secondary | ICD-10-CM | POA: Diagnosis not present

## 2023-12-21 DIAGNOSIS — I959 Hypotension, unspecified: Secondary | ICD-10-CM | POA: Diagnosis not present

## 2023-12-21 DIAGNOSIS — A419 Sepsis, unspecified organism: Principal | ICD-10-CM | POA: Diagnosis present

## 2023-12-21 DIAGNOSIS — Z91013 Allergy to seafood: Secondary | ICD-10-CM

## 2023-12-21 DIAGNOSIS — N83202 Unspecified ovarian cyst, left side: Secondary | ICD-10-CM | POA: Diagnosis not present

## 2023-12-21 DIAGNOSIS — R918 Other nonspecific abnormal finding of lung field: Secondary | ICD-10-CM | POA: Diagnosis present

## 2023-12-21 DIAGNOSIS — Z9109 Other allergy status, other than to drugs and biological substances: Secondary | ICD-10-CM

## 2023-12-21 DIAGNOSIS — Z833 Family history of diabetes mellitus: Secondary | ICD-10-CM

## 2023-12-21 DIAGNOSIS — Z888 Allergy status to other drugs, medicaments and biological substances status: Secondary | ICD-10-CM

## 2023-12-21 DIAGNOSIS — N1831 Chronic kidney disease, stage 3a: Secondary | ICD-10-CM | POA: Diagnosis present

## 2023-12-21 DIAGNOSIS — M160 Bilateral primary osteoarthritis of hip: Secondary | ICD-10-CM | POA: Diagnosis not present

## 2023-12-21 DIAGNOSIS — E2740 Unspecified adrenocortical insufficiency: Secondary | ICD-10-CM | POA: Diagnosis not present

## 2023-12-21 DIAGNOSIS — N179 Acute kidney failure, unspecified: Secondary | ICD-10-CM | POA: Diagnosis present

## 2023-12-21 DIAGNOSIS — M069 Rheumatoid arthritis, unspecified: Secondary | ICD-10-CM | POA: Diagnosis present

## 2023-12-21 DIAGNOSIS — N839 Noninflammatory disorder of ovary, fallopian tube and broad ligament, unspecified: Secondary | ICD-10-CM | POA: Diagnosis present

## 2023-12-21 DIAGNOSIS — Z6830 Body mass index (BMI) 30.0-30.9, adult: Secondary | ICD-10-CM

## 2023-12-21 DIAGNOSIS — R112 Nausea with vomiting, unspecified: Secondary | ICD-10-CM | POA: Diagnosis not present

## 2023-12-21 DIAGNOSIS — Z9842 Cataract extraction status, left eye: Secondary | ICD-10-CM

## 2023-12-21 DIAGNOSIS — Z7189 Other specified counseling: Secondary | ICD-10-CM | POA: Diagnosis not present

## 2023-12-21 DIAGNOSIS — M797 Fibromyalgia: Secondary | ICD-10-CM | POA: Diagnosis present

## 2023-12-21 DIAGNOSIS — E872 Acidosis, unspecified: Secondary | ICD-10-CM | POA: Diagnosis not present

## 2023-12-21 DIAGNOSIS — Z8719 Personal history of other diseases of the digestive system: Secondary | ICD-10-CM | POA: Insufficient documentation

## 2023-12-21 DIAGNOSIS — Z87891 Personal history of nicotine dependence: Secondary | ICD-10-CM | POA: Diagnosis not present

## 2023-12-21 DIAGNOSIS — I5032 Chronic diastolic (congestive) heart failure: Secondary | ICD-10-CM | POA: Diagnosis not present

## 2023-12-21 DIAGNOSIS — Z981 Arthrodesis status: Secondary | ICD-10-CM

## 2023-12-21 DIAGNOSIS — Z96619 Presence of unspecified artificial shoulder joint: Secondary | ICD-10-CM | POA: Diagnosis present

## 2023-12-21 DIAGNOSIS — Z886 Allergy status to analgesic agent status: Secondary | ICD-10-CM

## 2023-12-21 DIAGNOSIS — K219 Gastro-esophageal reflux disease without esophagitis: Secondary | ICD-10-CM | POA: Diagnosis present

## 2023-12-21 DIAGNOSIS — R5381 Other malaise: Secondary | ICD-10-CM | POA: Diagnosis not present

## 2023-12-21 DIAGNOSIS — J45909 Unspecified asthma, uncomplicated: Secondary | ICD-10-CM | POA: Diagnosis present

## 2023-12-21 DIAGNOSIS — Z9841 Cataract extraction status, right eye: Secondary | ICD-10-CM

## 2023-12-21 DIAGNOSIS — R531 Weakness: Secondary | ICD-10-CM | POA: Diagnosis not present

## 2023-12-21 DIAGNOSIS — Z7952 Long term (current) use of systemic steroids: Secondary | ICD-10-CM

## 2023-12-21 DIAGNOSIS — I130 Hypertensive heart and chronic kidney disease with heart failure and stage 1 through stage 4 chronic kidney disease, or unspecified chronic kidney disease: Secondary | ICD-10-CM | POA: Diagnosis present

## 2023-12-21 DIAGNOSIS — I10 Essential (primary) hypertension: Secondary | ICD-10-CM | POA: Diagnosis not present

## 2023-12-21 DIAGNOSIS — J9601 Acute respiratory failure with hypoxia: Secondary | ICD-10-CM | POA: Diagnosis present

## 2023-12-21 DIAGNOSIS — A4151 Sepsis due to Escherichia coli [E. coli]: Secondary | ICD-10-CM | POA: Diagnosis not present

## 2023-12-21 DIAGNOSIS — Z880 Allergy status to penicillin: Secondary | ICD-10-CM | POA: Diagnosis not present

## 2023-12-21 DIAGNOSIS — M25552 Pain in left hip: Secondary | ICD-10-CM | POA: Diagnosis not present

## 2023-12-21 DIAGNOSIS — A084 Viral intestinal infection, unspecified: Secondary | ICD-10-CM | POA: Diagnosis present

## 2023-12-21 DIAGNOSIS — R0689 Other abnormalities of breathing: Secondary | ICD-10-CM | POA: Diagnosis not present

## 2023-12-21 DIAGNOSIS — R000 Tachycardia, unspecified: Secondary | ICD-10-CM | POA: Diagnosis not present

## 2023-12-21 DIAGNOSIS — E785 Hyperlipidemia, unspecified: Secondary | ICD-10-CM | POA: Diagnosis present

## 2023-12-21 DIAGNOSIS — Z0389 Encounter for observation for other suspected diseases and conditions ruled out: Secondary | ICD-10-CM | POA: Diagnosis not present

## 2023-12-21 DIAGNOSIS — G9340 Encephalopathy, unspecified: Secondary | ICD-10-CM | POA: Diagnosis not present

## 2023-12-21 DIAGNOSIS — E876 Hypokalemia: Secondary | ICD-10-CM | POA: Diagnosis present

## 2023-12-21 DIAGNOSIS — Z79899 Other long term (current) drug therapy: Secondary | ICD-10-CM

## 2023-12-21 DIAGNOSIS — R7303 Prediabetes: Secondary | ICD-10-CM | POA: Insufficient documentation

## 2023-12-21 DIAGNOSIS — N281 Cyst of kidney, acquired: Secondary | ICD-10-CM | POA: Diagnosis not present

## 2023-12-21 DIAGNOSIS — Z8639 Personal history of other endocrine, nutritional and metabolic disease: Secondary | ICD-10-CM | POA: Diagnosis not present

## 2023-12-21 DIAGNOSIS — Z1152 Encounter for screening for COVID-19: Secondary | ICD-10-CM | POA: Diagnosis not present

## 2023-12-21 DIAGNOSIS — M0579 Rheumatoid arthritis with rheumatoid factor of multiple sites without organ or systems involvement: Secondary | ICD-10-CM | POA: Diagnosis not present

## 2023-12-21 DIAGNOSIS — R1013 Epigastric pain: Secondary | ICD-10-CM | POA: Diagnosis not present

## 2023-12-21 DIAGNOSIS — Z8249 Family history of ischemic heart disease and other diseases of the circulatory system: Secondary | ICD-10-CM

## 2023-12-21 DIAGNOSIS — R578 Other shock: Secondary | ICD-10-CM

## 2023-12-21 DIAGNOSIS — Z882 Allergy status to sulfonamides status: Secondary | ICD-10-CM

## 2023-12-21 DIAGNOSIS — K2900 Acute gastritis without bleeding: Secondary | ICD-10-CM | POA: Insufficient documentation

## 2023-12-21 DIAGNOSIS — G9341 Metabolic encephalopathy: Secondary | ICD-10-CM | POA: Diagnosis present

## 2023-12-21 DIAGNOSIS — I2510 Atherosclerotic heart disease of native coronary artery without angina pectoris: Secondary | ICD-10-CM | POA: Diagnosis not present

## 2023-12-21 DIAGNOSIS — R509 Fever, unspecified: Secondary | ICD-10-CM | POA: Diagnosis not present

## 2023-12-21 DIAGNOSIS — K2970 Gastritis, unspecified, without bleeding: Secondary | ICD-10-CM | POA: Diagnosis present

## 2023-12-21 LAB — RESPIRATORY PANEL BY PCR

## 2023-12-21 LAB — ECHOCARDIOGRAM COMPLETE
AR max vel: 2.02 cm2
AV Area VTI: 2.06 cm2
AV Area mean vel: 1.51 cm2
AV Mean grad: 6 mm[Hg]
AV Peak grad: 11.4 mm[Hg]
Ao pk vel: 1.69 m/s
Area-P 1/2: 3.12 cm2
Calc EF: 61.9 %
Height: 64 in
MV VTI: 2.37 cm2
S' Lateral: 3 cm
Single Plane A2C EF: 67 %
Single Plane A4C EF: 55.1 %
Weight: 2592.61 [oz_av]

## 2023-12-21 LAB — APTT: aPTT: 34 s (ref 24–36)

## 2023-12-21 LAB — TYPE AND SCREEN
ABO/RH(D): A POS
Antibody Screen: NEGATIVE

## 2023-12-21 LAB — RESP PANEL BY RT-PCR (RSV, FLU A&B, COVID)  RVPGX2
Influenza A by PCR: NEGATIVE
Influenza B by PCR: NEGATIVE
Resp Syncytial Virus by PCR: NEGATIVE
SARS Coronavirus 2 by RT PCR: NEGATIVE

## 2023-12-21 LAB — COMPREHENSIVE METABOLIC PANEL
ALT: 28 U/L (ref 0–44)
AST: 50 U/L — ABNORMAL HIGH (ref 15–41)
Albumin: 3.3 g/dL — ABNORMAL LOW (ref 3.5–5.0)
Alkaline Phosphatase: 75 U/L (ref 38–126)
Anion gap: 9 (ref 5–15)
BUN: 17 mg/dL (ref 8–23)
CO2: 25 mmol/L (ref 22–32)
Calcium: 8.8 mg/dL — ABNORMAL LOW (ref 8.9–10.3)
Chloride: 107 mmol/L (ref 98–111)
Creatinine, Ser: 1.3 mg/dL — ABNORMAL HIGH (ref 0.44–1.00)
GFR, Estimated: 39 mL/min — ABNORMAL LOW (ref >60)
Glucose, Bld: 107 mg/dL — ABNORMAL HIGH (ref 70–99)
Potassium: 3.2 mmol/L — ABNORMAL LOW (ref 3.5–5.1)
Sodium: 141 mmol/L (ref 135–145)
Total Bilirubin: 0.8 mg/dL (ref 0.0–1.2)
Total Protein: 7 g/dL (ref 6.5–8.1)

## 2023-12-21 LAB — CBC WITH DIFFERENTIAL/PLATELET
Abs Immature Granulocytes: 0.04 10*3/uL (ref 0.00–0.07)
Basophils Absolute: 0.1 10*3/uL (ref 0.0–0.1)
Basophils Relative: 1 %
Eosinophils Absolute: 0 10*3/uL (ref 0.0–0.5)
Eosinophils Relative: 0 %
HCT: 44.7 % (ref 36.0–46.0)
Hemoglobin: 13.7 g/dL (ref 12.0–15.0)
Immature Granulocytes: 0 %
Lymphocytes Relative: 21 %
Lymphs Abs: 2.1 10*3/uL (ref 0.7–4.0)
MCH: 28.8 pg (ref 26.0–34.0)
MCHC: 30.6 g/dL (ref 30.0–36.0)
MCV: 94.1 fL (ref 80.0–100.0)
Monocytes Absolute: 1 10*3/uL (ref 0.1–1.0)
Monocytes Relative: 10 %
Neutro Abs: 6.8 10*3/uL (ref 1.7–7.7)
Neutrophils Relative %: 68 %
Platelets: 226 10*3/uL (ref 150–400)
RBC: 4.75 MIL/uL (ref 3.87–5.11)
RDW: 17 % — ABNORMAL HIGH (ref 11.5–15.5)
WBC: 9.9 10*3/uL (ref 4.0–10.5)
nRBC: 0 % (ref 0.0–0.2)

## 2023-12-21 LAB — URINALYSIS, W/ REFLEX TO CULTURE (INFECTION SUSPECTED)
Bacteria, UA: NONE SEEN
Bilirubin Urine: NEGATIVE
Glucose, UA: NEGATIVE mg/dL
Ketones, ur: NEGATIVE mg/dL
Leukocytes,Ua: NEGATIVE
Nitrite: NEGATIVE
Protein, ur: 100 mg/dL — AB
Specific Gravity, Urine: 1.027 (ref 1.005–1.030)
pH: 6 (ref 5.0–8.0)

## 2023-12-21 LAB — CREATININE, SERUM
Creatinine, Ser: 1.07 mg/dL — ABNORMAL HIGH (ref 0.44–1.00)
GFR, Estimated: 50 mL/min — ABNORMAL LOW (ref >60)

## 2023-12-21 LAB — PROTIME-INR
INR: 1.3 — ABNORMAL HIGH (ref 0.8–1.2)
Prothrombin Time: 15.9 s — ABNORMAL HIGH (ref 11.4–15.2)

## 2023-12-21 LAB — CBC
HCT: 34.8 % — ABNORMAL LOW (ref 36.0–46.0)
Hemoglobin: 11.3 g/dL — ABNORMAL LOW (ref 12.0–15.0)
MCH: 29.7 pg (ref 26.0–34.0)
MCHC: 32.5 g/dL (ref 30.0–36.0)
MCV: 91.6 fL (ref 80.0–100.0)
Platelets: 190 10*3/uL (ref 150–400)
RBC: 3.8 MIL/uL — ABNORMAL LOW (ref 3.87–5.11)
RDW: 17.1 % — ABNORMAL HIGH (ref 11.5–15.5)
WBC: 8 10*3/uL (ref 4.0–10.5)
nRBC: 0 % (ref 0.0–0.2)

## 2023-12-21 LAB — PROCALCITONIN: Procalcitonin: 4.28 ng/mL

## 2023-12-21 LAB — MRSA NEXT GEN BY PCR, NASAL: MRSA by PCR Next Gen: NOT DETECTED

## 2023-12-21 LAB — PHOSPHORUS: Phosphorus: 3.6 mg/dL (ref 2.5–4.6)

## 2023-12-21 LAB — LIPASE, BLOOD: Lipase: 20 U/L (ref 11–51)

## 2023-12-21 LAB — BRAIN NATRIURETIC PEPTIDE: B Natriuretic Peptide: 99.9 pg/mL (ref 0.0–100.0)

## 2023-12-21 LAB — I-STAT CG4 LACTIC ACID, ED
Lactic Acid, Venous: 2.7 mmol/L (ref 0.5–1.9)
Lactic Acid, Venous: 2.1 mmol/L (ref 0.5–1.9)

## 2023-12-21 LAB — LACTIC ACID, PLASMA
Lactic Acid, Venous: 1 mmol/L (ref 0.5–1.9)
Lactic Acid, Venous: 1 mmol/L (ref 0.5–1.9)

## 2023-12-21 LAB — AMMONIA: Ammonia: 23 umol/L (ref 9–35)

## 2023-12-21 LAB — CORTISOL: Cortisol, Plasma: >100 ug/dL

## 2023-12-21 LAB — MAGNESIUM: Magnesium: 1.6 mg/dL — ABNORMAL LOW (ref 1.7–2.4)

## 2023-12-21 LAB — ABO/RH: ABO/RH(D): A POS

## 2023-12-21 MED ORDER — PANTOPRAZOLE SODIUM 40 MG IV SOLR
40.0000 mg | Freq: Once | INTRAVENOUS | Status: AC
  Administered 2023-12-21: 40 mg via INTRAVENOUS
  Filled 2023-12-21: qty 10

## 2023-12-21 MED ORDER — LACTATED RINGERS IV SOLN: INTRAVENOUS | Status: AC

## 2023-12-21 MED ORDER — METRONIDAZOLE 500 MG/100ML IV SOLN
500.0000 mg | Freq: Once | INTRAVENOUS | Status: AC
  Administered 2023-12-21: 500 mg via INTRAVENOUS
  Filled 2023-12-21: qty 100

## 2023-12-21 MED ORDER — LACTATED RINGERS IV BOLUS
1000.0000 mL | Freq: Once | INTRAVENOUS | Status: AC
  Administered 2023-12-21: 1000 mL via INTRAVENOUS

## 2023-12-21 MED ORDER — DOCUSATE SODIUM 100 MG PO CAPS: 100.0000 mg | ORAL_CAPSULE | Freq: Two times a day (BID) | ORAL | Status: DC | PRN

## 2023-12-21 MED ORDER — VANCOMYCIN HCL 500 MG IV SOLR
500.0000 mg | Freq: Once | INTRAVENOUS | Status: AC
  Administered 2023-12-21: 500 mg via INTRAVENOUS
  Filled 2023-12-21: qty 10

## 2023-12-21 MED ORDER — NOREPINEPHRINE 4 MG/250ML-% IV SOLN
2.0000 ug/min | INTRAVENOUS | Status: DC
  Administered 2023-12-21: 2 ug/min via INTRAVENOUS

## 2023-12-21 MED ORDER — ONDANSETRON HCL 4 MG/2ML IJ SOLN
4.0000 mg | Freq: Once | INTRAMUSCULAR | Status: AC
Start: 2023-12-21 — End: 2023-12-21
  Administered 2023-12-21: 4 mg via INTRAVENOUS
  Filled 2023-12-21: qty 2

## 2023-12-21 MED ORDER — VANCOMYCIN HCL 500 MG/100ML IV SOLN
500.0000 mg | Freq: Once | INTRAVENOUS | Status: DC
  Filled 2023-12-21: qty 100

## 2023-12-21 MED ORDER — LACTATED RINGERS IV BOLUS (SEPSIS)
1000.0000 mL | Freq: Once | INTRAVENOUS | Status: AC
Start: 2023-12-21 — End: 2023-12-21
  Administered 2023-12-21: 1000 mL via INTRAVENOUS

## 2023-12-21 MED ORDER — IOHEXOL 350 MG/ML SOLN
75.0000 mL | Freq: Once | INTRAVENOUS | Status: AC | PRN
  Administered 2023-12-21: 75 mL via INTRAVENOUS

## 2023-12-21 MED ORDER — HYDROCORTISONE SOD SUC (PF) 100 MG IJ SOLR
100.0000 mg | Freq: Two times a day (BID) | INTRAMUSCULAR | Status: DC
  Administered 2023-12-21 – 2023-12-23 (×4): 100 mg via INTRAVENOUS
  Filled 2023-12-21 (×6): qty 2

## 2023-12-21 MED ORDER — NOREPINEPHRINE 4 MG/250ML-% IV SOLN
INTRAVENOUS | Status: AC
  Filled 2023-12-21: qty 250

## 2023-12-21 MED ORDER — ACETAMINOPHEN 500 MG PO TABS
1000.0000 mg | ORAL_TABLET | Freq: Once | ORAL | Status: AC
  Administered 2023-12-21: 1000 mg via ORAL
  Filled 2023-12-21: qty 2

## 2023-12-21 MED ORDER — SODIUM CHLORIDE 0.9 % IV SOLN
250.0000 mL | INTRAVENOUS | Status: AC
  Administered 2023-12-21: 250 mL via INTRAVENOUS

## 2023-12-21 MED ORDER — POTASSIUM CHLORIDE 10 MEQ/100ML IV SOLN
10.0000 meq | INTRAVENOUS | Status: AC
  Administered 2023-12-21 (×3): 10 meq via INTRAVENOUS
  Filled 2023-12-21 (×4): qty 100

## 2023-12-21 MED ORDER — CHLORHEXIDINE GLUCONATE CLOTH 2 % EX PADS: 6.0000 | MEDICATED_PAD | Freq: Every day | CUTANEOUS | Status: DC

## 2023-12-21 MED ORDER — VANCOMYCIN HCL 1250 MG/250ML IV SOLN
1250.0000 mg | INTRAVENOUS | Status: DC
Start: 2023-12-23 — End: 2023-12-22

## 2023-12-21 MED ORDER — CEFEPIME HCL 2 G IV SOLR
2.0000 g | INTRAVENOUS | Status: DC
  Administered 2023-12-22: 2 g via INTRAVENOUS
  Filled 2023-12-21: qty 12.5

## 2023-12-21 MED ORDER — VANCOMYCIN HCL IN DEXTROSE 1-5 GM/200ML-% IV SOLN
1000.0000 mg | Freq: Once | INTRAVENOUS | Status: AC
  Administered 2023-12-21: 1000 mg via INTRAVENOUS
  Filled 2023-12-21: qty 200

## 2023-12-21 MED ORDER — SODIUM CHLORIDE 0.9 % IV SOLN
500.0000 mg | INTRAVENOUS | Status: DC
  Administered 2023-12-21 – 2023-12-22 (×2): 500 mg via INTRAVENOUS
  Filled 2023-12-21 (×3): qty 5

## 2023-12-21 MED ORDER — MORPHINE SULFATE (PF) 2 MG/ML IV SOLN: 2.0000 mg | Freq: Once | INTRAVENOUS | Status: DC

## 2023-12-21 MED ORDER — POLYETHYLENE GLYCOL 3350 17 G PO PACK: 17.0000 g | PACK | Freq: Every day | ORAL | Status: DC | PRN

## 2023-12-21 MED ORDER — SODIUM CHLORIDE 0.9 % IV SOLN
2.0000 g | Freq: Once | INTRAVENOUS | Status: AC
  Administered 2023-12-21: 2 g via INTRAVENOUS
  Filled 2023-12-21: qty 12.5

## 2023-12-21 MED ORDER — POTASSIUM CHLORIDE 10 MEQ/100ML IV SOLN
INTRAVENOUS | Status: AC
  Administered 2023-12-21: 10 meq
  Filled 2023-12-21: qty 100

## 2023-12-21 MED ORDER — HEPARIN SODIUM (PORCINE) 5000 UNIT/ML IJ SOLN
5000.0000 [IU] | Freq: Three times a day (TID) | INTRAMUSCULAR | Status: DC
  Administered 2023-12-21 – 2023-12-26 (×15): 5000 [IU] via SUBCUTANEOUS
  Filled 2023-12-21 (×15): qty 1

## 2023-12-21 NOTE — Sepsis Progress Note (Signed)
 eLink is following this Code Sepsis.

## 2023-12-21 NOTE — Assessment & Plan Note (Signed)
 off acid reducer. Hgb 13.71/10/25

## 2023-12-21 NOTE — Assessment & Plan Note (Signed)
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 Diclofenac, MRI completed, Neurosurgery consulted, not a good surgical candidate.

## 2023-12-21 NOTE — ED Triage Notes (Addendum)
 Pt from [facility] for nausea and lethargy, reports feeling "a little off" yesterday, but worsened this morning  Ems reports temp 102, tachy 115, tachypnea

## 2023-12-21 NOTE — Progress Notes (Addendum)
 eLink Physician-Brief Progress Note Patient Name: Eileen Wilkerson DOB: Feb 11, 1932 MRN: [id number]   Date of Service  12/21/2023  HPI/Events of Note    eICU Interventions      , patient has not voided all day  RN did a bladder scan revealing 440 ml  In and out cath   Addendum Responding to IVF Will continue    [facility] 12/21/2023, 9:21 PM

## 2023-12-21 NOTE — Assessment & Plan Note (Signed)
Vit B12 deficiency, on Vit B12 po, Vit B12 235 06/20/21

## 2023-12-21 NOTE — Plan of Care (Signed)
  Problem: Nutrition: Goal: Adequate nutrition will be maintained Outcome: Not Progressing   Problem: Skin Integrity: Goal: Risk for impaired skin integrity will decrease Outcome: Progressing   

## 2023-12-21 NOTE — Assessment & Plan Note (Signed)
 K 3.2 12/21/23,  on Kcl

## 2023-12-21 NOTE — H&P (Signed)
 NAME:  Eileen Wilkerson, MRN:  [id number], DOB:  03/17/1932, LOS: 0 ADMISSION DATE:  12/21/2023, CONSULTATION DATE:  1/10 REFERRING MD:  Elnor, CHIEF COMPLAINT:  shock    History of Present Illness:  89 year old female w/ extensive medical hx as outlined below but most notably: RA on chronic steroids, adrenal insuff, HFpEF, CCD stage *, significant difficulty w/ ADLs at baseline. [facility]  Seen by NP at [facility] senior care on 1/9 w/ cc: N/V/D w/ associated abd pain. At time afebrile. No sick contacts. Felt to be gastritis. Provided w/ script for zofran , instructed to hold her diuretic and labs ordered.  Presented to ER 1/10 confused, febrile temp 103.3  HR 115, while in the ER got hypotensive SBP 80s, received 2 liters crystalloid, blood cultures sent, started on empiric abx and ultimately norepi to maintain MAP so Critical care asked to admit In ER initial dx eval  COVID-negative, influenza negative Serum creatinine 1.3 baseline around 1-1.1 white blood cell count 9.9 hemoglobin 13.7 INR 1.3 lactate 2.7, cleared to 2.1 post volume resuscitation urinalysis positive for protein.  CT obtained negative for pulmonary emboli, multiple solid noncalcified lung nodules, no acute inflammatory process in the abdomen or pelvis there is a 3.5 x 4.3 hypoattenuating structure in the left adnexa felt ovarian in etiology, also a tiny soft umbilical hernia Pertinent  Medical History  RA on chronic steroids involving multple joints (previously on Enbrel, Plaqunil, Arava , methotrexate , GERD, Fibromyalgia, HTN, asthma, adrenal insuff/adrenal failure,  prior encephalopathy, DVT, steroid induced hyperglycemia, HFpEF, Gout, UTI, e coli bacteremia, actinic keratosis, perf sig colon, physical deconditioning, DDD, OA  Significant Hospital Events: Including procedures, antibiotic start and stop dates in addition to other pertinent events   1/10 admitted with septic shock, etiology not clear pancultured,  broad-spectrum antibiotic started, started on stress dose steroids  Interim History / Subjective:  Good bed, slow to respond fairly lethargic  Objective   Blood pressure (!) 93/40, pulse 62, temperature (!) 100.9 F (38.3 C), temperature source Oral, resp. rate (!) 27, height 5' 4 (1.626 m), weight 73.5 kg, SpO2 94%.       No intake or output data in the 24 hours ending 12/21/23 1417 Filed Weights   12/21/23 0833  Weight: 73.5 kg    Examination: General: 89 year old white female laying in bed currently slow to respond, lethargic, but not in acute distress HENT: Mucous membranes are dry sclera nonicteric no JVD Lungs: Crackles both bases , Currently on simple facemask..  CT imaging reviewed some mild bibasilar atelectasis otherwise nothing to explain her work of breathing Cardiac regular rate and rhythm without murmur rub or gallop Abdomen: Soft, tender to palpation bowel sounds present Extremities: Warm, dry, chronic venous stasis changes with 1+ edema Neuro: Awake, oriented x 2, very lethargic moves all extremities no focal deficits Resolved Hospital Problem list    Assessment & Plan:  Septic shock with lactic acidosis.  Source currently not clear.  Certainly possible she has some sort of viral gastroenteritis complicated by underlying adrenal insufficiency Plan Continue maintenance IV fluids Started on empiric, azithromycin , vancomycin , and cefepime . Cultures sent, will follow Follow-up respiratory viral panel Send GI viral panel Titrate norepinephrine  for mean arterial pressure greater than 65 Hold her home amlodipine  and torsemide Follow-up lactate Admit to intensive care  History of adrenal insufficiency.  On chronic steroids, prior history of acute on chronic renal insufficiency as well Plan Stress dose steroids  Acute metabolic encephalopathy secondary to sepsis Plan  Supportive care Minimize sedation  Acute renal failure on chronic renal failure CKD Stage 3b -  GFR 30 to 44 (Moderately to severely decreased) Plan Holding diuretics and antihypertensives Shooting for mean arterial pressure greater than 65 Renal dose medications Strict intake output Follow-up chemistries  Hypokalemia Plan Replace and recheck  Nausea and vomiting.  CT negative for acute inflammatory process Plan Check lipase As needed antiemetics  Ovarian mass, as well as multiple solid noncalcified lung nodules Worrisome for malignancy Plan Will need to discuss follow-up after resolution of acute illness  History of rheumatoid arthritis with significant deconditioning as an impact on ADLs Plan Stress dosing steroids for now Eventually resume home steroid dosing  Best Practice (right click and Reselect all SmartList Selections daily)   Diet/type: NPO w/ oral meds DVT prophylaxis prophylactic heparin   Pressure ulcer(s): N/A GI prophylaxis: N/A Lines: N/A Foley:  N/A Code Status:  full code Last date of multidisciplinary goals of care discussion [pending]  Labs   CBC: Recent Labs  Lab 12/21/23 0900  WBC 9.9  NEUTROABS 6.8  HGB 13.7  HCT 44.7  MCV 94.1  PLT 226    Basic Metabolic Panel: Recent Labs  Lab 12/21/23 0900  NA 141  K 3.2*  CL 107  CO2 25  GLUCOSE 107*  BUN 17  CREATININE 1.30*  CALCIUM  8.8*   GFR: Estimated Creatinine Clearance: 28.8 mL/min (A) (by C-G formula based on SCr of 1.3 mg/dL (H)). Recent Labs  Lab 12/21/23 0900 12/21/23 0912 12/21/23 1140  WBC 9.9  --   --   LATICACIDVEN  --  2.7* 2.1*    Liver Function Tests: Recent Labs  Lab 12/21/23 0900  AST 50*  ALT 28  ALKPHOS 75  BILITOT 0.8  PROT 7.0  ALBUMIN  3.3*   No results for input(s): LIPASE, AMYLASE in the last 168 hours. Recent Labs  Lab 12/21/23 1130  AMMONIA 23    ABG    Component Value Date/Time   PHART 7.37 05/21/2022 1606   PCO2ART 41 05/21/2022 1606   PO2ART 71 (L) 05/21/2022 1606   HCO3 23.7 05/21/2022 1606   ACIDBASEDEF 1.5  05/21/2022 1606   O2SAT 95.6 05/21/2022 1606     Coagulation Profile: Recent Labs  Lab 12/21/23 0900  INR 1.3*    Cardiac Enzymes: No results for input(s): CKTOTAL, CKMB, CKMBINDEX, TROPONINI in the last 168 hours.  HbA1C: Hemoglobin A1C  Date/Time Value Ref Range Status  11/28/2023 12:00 AM 6.5  Final   Hgb A1c MFr Bld  Date/Time Value Ref Range Status  02/15/2023 03:25 PM 6.7 (H) 4.8 - 5.6 % Final    Comment:    (NOTE)         Prediabetes: 5.7 - 6.4         Diabetes: >6.4         Glycemic control for adults with diabetes: <7.0     CBG: No results for input(s): GLUCAP in the last 168 hours.  Review of Systems:   Not able   Past Medical History:  She,  has a past medical history of Adrenal failure (HCC), Arthritis, Asthma, Cancer (HCC), Cataract, Closed nondisplaced fracture of fifth right metatarsal bone (09/18/2017), Diverticulitis, E coli bacteremia, Fibromyalgia (2008), GERD (gastroesophageal reflux disease) (12/18/2016), HCAP (healthcare-associated pneumonia) (02/03/2018), Hyperlipidemia (10/13/2021), Osteoporosis, RA (rheumatoid arthritis) (HCC), Recurrent upper respiratory infection (URI), Sepsis due to urinary tract infection (HCC) (01/18/2018), and Urticaria.   Surgical History:   Past Surgical History:  Procedure Laterality Date  BACK SURGERY     BILATERAL CARPAL TUNNEL RELEASE  2005   right and left   CATARACT EXTRACTION, BILATERAL  2004   right and left   CERVICAL FUSION  2011,2010,2008   2 disks   HEEL SPUR SURGERY  2004   lower back fusion  2011   Fusion of 3-4 and 4-5 lower back   RADIOFREQUENCY ABLATION  2020   ROTATOR CUFF REPAIR  [id number]   SQUAMOUS CELL CARCINOMA EXCISION     TONSILLECTOMY AND ADENOIDECTOMY  1947   TOTAL SHOULDER ARTHROPLASTY       Social History:   reports that she quit smoking about 57 years ago. Her smoking use included cigarettes. She started smoking about 67 years ago. She has a 7.5 pack-year smoking  history. She has never been exposed to tobacco smoke. She has never used smokeless tobacco. She reports that she does not drink alcohol and does not use drugs.   Family History:  Her family history includes Breast cancer (age of onset: 17) in her mother; Congestive Heart Failure (age of onset: 27) in her father; Diabetes in her father; Heart attack in her maternal grandmother and paternal grandfather; Heart disease in her father. There is no history of Allergic rhinitis, Asthma, Eczema, or Urticaria.   Allergies Allergies  Allergen Reactions   Adhesive [Tape] Rash   Celebrex [Celecoxib] Hives   Ciprofibrate Nausea Only   Cymbalta [Duloxetine Hcl] Swelling   Gabitril [Tiagabine] Swelling   Lyrica [Pregabalin] Swelling   Neurontin [Gabapentin] Swelling   Nexium  [Esomeprazole ] Rash   Nsaids Rash   Shrimp [Shellfish Allergy] Anaphylaxis    Per patient shrimp only   Azactam  [Aztreonam ]     Hand swelling    Azelastine  Hcl     Rash    Ciprofloxacin  Other (See Comments)    dizziness   Claritin  [Loratadine ]     Irritability Nervousness    Methotrexate  Derivatives    Nasacort  [Triamcinolone ]     Dizzy    Olopatadine  Other (See Comments)    Pain and lethargy    Other    Sulfamethizole Other (See Comments)    unknown   Zantac [Ranitidine Hcl] Other (See Comments)    unknown   Claritin -D 12 Hour [Loratadine -Pseudoephedrine Er] Anxiety   Keflex  [Cephalexin ] Nausea And Vomiting    Tolerated Ancef    Penicillins Rash    Injection site reaction. Tolerated cefepime  in past. Also reports tolerating a penicillin infusion after this initial rxn ~20 yrs ago.  Has patient had a PCN reaction causing immediate rash, facial/tongue/throat swelling, SOB or lightheadedness with hypotension: No Has patient had a PCN reaction causing severe rash involving mucus membranes or skin necrosis: No Has patient had a PCN reaction that required hospitalization No Has patient had a PCN reaction occurring within  the last 10 years: No     Sulfa Antibiotics Nausea And Vomiting     Home Medications  Prior to Admission medications   Medication Sig Start Date End Date Taking? Authorizing Provider  acetaminophen  (TYLENOL ) 500 MG tablet Take 650 mg by mouth in the morning and at bedtime.   Yes [provider]  amLODipine  (NORVASC ) 5 MG tablet Take 5 mg by mouth daily.   Yes [provider]  atorvastatin  (LIPITOR) 20 MG tablet TAKE 1 TABLET BY MOUTH EVERY DAY 03/26/23  Yes Mast, [name], NP  Biotin  5 MG TBDP Take 1 tablet (5 mg total) by mouth daily. 06/20/22  Yes Medina-Vargas, Monina C, NP  Cholecalciferol  (VITAMIN  D3) 125 MCG (5000 UT) CAPS TAKE 1 CAPSULE BY MOUTH EVERY DAY 07/24/22  Yes Mast, [name], NP  colchicine  0.6 MG tablet TAKE 1 TABLET BY MOUTH EVERY DAY 08/28/22  Yes Mast, [name], NP  CRANBERRY PO Take 1 capsule by mouth 2 (two) times daily.   Yes [provider]  cycloSPORINE  (RESTASIS ) 0.05 % ophthalmic emulsion Place 1 drop into both eyes 2 (two) times daily. 06/20/22  Yes Medina-Vargas, Monina C, NP  D-Mannose 500 MG CAPS Take 500 mg by mouth daily.   Yes [provider]  diclofenac  Sodium (VOLTAREN ) 1 % GEL APPLY 2 TO 4 GRAMS TOPICALLY TO AFFECTED JOINTS UP TO 4 TIMES DAILY 06/20/22  Yes Medina-Vargas, Monina C, NP  fexofenadine  (ALLEGRA ) 180 MG tablet Take 1 tablet (180 mg total) by mouth daily. 06/20/22  Yes Medina-Vargas, Monina C, NP  ipratropium-albuterol  (DUONEB) 0.5-2.5 (3) MG/3ML SOLN Take 3 mLs by nebulization as needed.   Yes [provider]  lansoprazole  (PREVACID ) 30 MG capsule TAKE 1 CAPSULE BY MOUTH ONCE DAILY AT NOON Patient taking differently: Take 30 mg by mouth daily in the afternoon. 10/10/22  Yes Mast, [name], NP  montelukast  (SINGULAIR ) 10 MG tablet TAKE 1 TABLET BY MOUTH EVERYDAY AT BEDTIME Patient taking differently: Take 10 mg by mouth at bedtime. 08/28/22  Yes Mast, [name], NP  ondansetron  (ZOFRAN ) 4 MG tablet Take 4 mg by mouth as  needed for nausea or vomiting.   Yes [provider]  potassium chloride  SA (KLOR-CON  M) 20 MEQ tablet Take 40 mEq by mouth 2 (two) times daily.   Yes [provider]  predniSONE  (DELTASONE ) 5 MG tablet Take 5 mg by mouth daily with breakfast.   Yes [provider]  saccharomyces boulardii (FLORASTOR) 250 MG capsule Take 250 mg by mouth 2 (two) times daily.   Yes [provider]  Torsemide 40 MG TABS Take 40 mg by mouth daily. 09/04/23  Yes [provider]  traMADol  (ULTRAM ) 50 MG tablet Take 1 tablet (50 mg total) by mouth every 6 (six) hours as needed (pain). 09/18/23  Yes Medina-Vargas, Monina C, NP     Critical care time: 32 min

## 2023-12-21 NOTE — Assessment & Plan Note (Signed)
 Hgb a1c 6.5 12/21/23,  prediabetic, on Steroids, monitor CBG

## 2023-12-21 NOTE — ED Provider Notes (Addendum)
 [facility]-ICU/STEPDOWN Provider Note  CSN: [id number] Arrival date & time: 12/21/23 9178  Chief Complaint(s) Nausea (Lethargy)  HPI Eileen Wilkerson is a 89 y.o. female with past medical history as below, significant for fibromyalgia, GERD, RA, hypertension, metabolic encephalopathy, asthma, adrenal failure who presents to the ED with complaint of nausea, vomiting, fever  Patient here from SNF, feeling unwell last 24 hours.  Abdominal pain, nausea or vomiting.  Does not normally wear oxygen, was hypoxic on arrival.  Patient reports that she is burning up.  Unsure of sick contacts.  Denies any rashes or wounds.  No chest pain.  Denies any urinary complaints. No diarrhea.   Past Medical History Past Medical History:  Diagnosis Date   Adrenal failure (HCC)    Arthritis    Asthma    Cancer (HCC)    Cataract    Closed nondisplaced fracture of fifth right metatarsal bone 09/18/2017   Diverticulitis    Per patient   E coli bacteremia    Fibromyalgia 2008   GERD (gastroesophageal reflux disease) 12/18/2016   HCAP (healthcare-associated pneumonia) 02/03/2018   Hyperlipidemia 10/13/2021   Osteoporosis    RA (rheumatoid arthritis) (HCC)    Recurrent upper respiratory infection (URI)    Sepsis due to urinary tract infection (HCC) 01/18/2018   Urticaria    Patient Active Problem List   Diagnosis Date Noted   Acute gastritis 12/21/2023   Prediabetes 12/21/2023   History of diverticulitis 12/21/2023   Deep tissue injury 09/06/2023   HTN (hypertension) 08/07/2023   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   Acute metabolic encephalopathy 02/12/2023   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 02/12/2023   Urinary frequency 02/08/2023   Left hip pain 02/08/2023   Chronic diastolic heart failure (HCC) 11/06/2022   Gout 11/06/2022   Sepsis secondary to UTI (HCC) 10/27/2022   History of DVT (deep vein thrombosis) 05/26/2022   E coli  bacteremia    Sepsis (HCC) 05/21/2022   Diarrhea 05/21/2022   Gait abnormality 01/26/2022   Sinus tachycardia 10/27/2021   Weight gain 10/14/2021   Hyperlipidemia 10/13/2021   Dysuria 06/23/2021   Osteopenia 06/07/2021   Adnexal cyst 03/03/2021   AK (actinic keratosis) 03/03/2021   UTI (urinary tract infection) 07/22/2020   Allergic rhinitis 07/22/2020   Asthma 07/08/2020   Perforation of sigmoid colon due to diverticulitis 06/23/2020   Hair loss 08/06/2019   SOB (shortness of breath) on exertion 08/06/2019   Pure hypercholesterolemia 08/06/2019   B12 deficiency 03/11/2018   CKD (chronic kidney disease) stage 3, GFR 30-59 ml/min (HCC) 02/14/2018   E. coli UTI 01/18/2018   Edema of both lower extremities due to peripheral venous insufficiency 09/18/2017   Bilateral lower extremity edema 06/30/2017   Physical deconditioning 05/30/2017   Immunosuppressed status (HCC) 05/30/2017   Abnormal chest x-ray 05/17/2017   Hypokalemia 04/25/2017   Fibromyalgia 12/18/2016   Spondylosis of lumbar region without myelopathy or radiculopathy 12/18/2016   Trigger finger, right middle finger 12/18/2016   Primary osteoarthritis of both feet 12/18/2016   Primary osteoarthritis of both knees 12/18/2016   GERD (gastroesophageal reflux disease) 12/18/2016   Age-related osteoporosis without current pathological fracture 12/18/2016   Rheumatoid arthritis (HCC) 12/28/2015   Long term current use of systemic steroids, for RA 12/28/2015   Lymphocytosis 11/13/2012   Home Medication(s) Prior to Admission medications   Medication Sig Start Date End Date Taking? Authorizing Provider  acetaminophen  (TYLENOL ) 500 MG tablet Take 650 mg by mouth  in the morning and at bedtime.   Yes [provider]  amLODipine  (NORVASC ) 5 MG tablet Take 5 mg by mouth daily.   Yes [provider]  atorvastatin  (LIPITOR) 20 MG tablet TAKE 1 TABLET BY MOUTH EVERY DAY 03/26/23  Yes Mast, [name], NP  Biotin  5 MG TBDP  Take 1 tablet (5 mg total) by mouth daily. 06/20/22  Yes Medina-Vargas, Monina C, NP  Cholecalciferol  (VITAMIN D3) 125 MCG (5000 UT) CAPS TAKE 1 CAPSULE BY MOUTH EVERY DAY 07/24/22  Yes Mast, [name], NP  colchicine  0.6 MG tablet TAKE 1 TABLET BY MOUTH EVERY DAY 08/28/22  Yes Mast, [name], NP  CRANBERRY PO Take 1 capsule by mouth 2 (two) times daily.   Yes [provider]  cycloSPORINE  (RESTASIS ) 0.05 % ophthalmic emulsion Place 1 drop into both eyes 2 (two) times daily. 06/20/22  Yes Medina-Vargas, Monina C, NP  D-Mannose 500 MG CAPS Take 500 mg by mouth daily.   Yes [provider]  diclofenac  Sodium (VOLTAREN ) 1 % GEL APPLY 2 TO 4 GRAMS TOPICALLY TO AFFECTED JOINTS UP TO 4 TIMES DAILY 06/20/22  Yes Medina-Vargas, Monina C, NP  fexofenadine  (ALLEGRA ) 180 MG tablet Take 1 tablet (180 mg total) by mouth daily. 06/20/22  Yes Medina-Vargas, Monina C, NP  ipratropium-albuterol  (DUONEB) 0.5-2.5 (3) MG/3ML SOLN Take 3 mLs by nebulization as needed.   Yes [provider]  lansoprazole  (PREVACID ) 30 MG capsule TAKE 1 CAPSULE BY MOUTH ONCE DAILY AT NOON Patient taking differently: Take 30 mg by mouth daily in the afternoon. 10/10/22  Yes Mast, [name], NP  montelukast  (SINGULAIR ) 10 MG tablet TAKE 1 TABLET BY MOUTH EVERYDAY AT BEDTIME Patient taking differently: Take 10 mg by mouth at bedtime. 08/28/22  Yes Mast, [name], NP  ondansetron  (ZOFRAN ) 4 MG tablet Take 4 mg by mouth as needed for nausea or vomiting.   Yes [provider]  potassium chloride  SA (KLOR-CON  M) 20 MEQ tablet Take 40 mEq by mouth 2 (two) times daily.   Yes [provider]  predniSONE  (DELTASONE ) 5 MG tablet Take 5 mg by mouth daily with breakfast.   Yes [provider]  saccharomyces boulardii (FLORASTOR) 250 MG capsule Take 250 mg by mouth 2 (two) times daily.   Yes [provider]  Torsemide 40 MG TABS Take 40 mg by mouth daily. 09/04/23  Yes [provider]  traMADol  (ULTRAM )  50 MG tablet Take 1 tablet (50 mg total) by mouth every 6 (six) hours as needed (pain). 09/18/23  Yes Medina-Vargas, Monina C, NP                                                                                                                                    Past Surgical History Past Surgical History:  Procedure Laterality Date   BACK SURGERY     BILATERAL CARPAL TUNNEL RELEASE  2005  right and left   CATARACT EXTRACTION, BILATERAL  2004   right and left   CERVICAL FUSION  2011,2010,2008   2 disks   HEEL SPUR SURGERY  2004   lower back fusion  2011   Fusion of 3-4 and 4-5 lower back   RADIOFREQUENCY ABLATION  2020   ROTATOR CUFF REPAIR  [id number]   SQUAMOUS CELL CARCINOMA EXCISION     TONSILLECTOMY AND ADENOIDECTOMY  1947   TOTAL SHOULDER ARTHROPLASTY     Family History Family History  Problem Relation Age of Onset   Heart attack Maternal Grandmother    Heart attack Paternal Grandfather    Breast cancer Mother 28   Diabetes Father    Heart disease Father    Congestive Heart Failure Father 50       Died from   Allergic rhinitis Neg Hx    Asthma Neg Hx    Eczema Neg Hx    Urticaria Neg Hx     Social History Social History   Tobacco Use   Smoking status: Former    Current packs/day: 0.00    Average packs/day: 0.8 packs/day for 10.0 years (7.5 ttl pk-yrs)    Types: Cigarettes    Start date: 37    Quit date: 1968    Years since quitting: 57.0    Passive exposure: Never   Smokeless tobacco: Never  Vaping Use   Vaping status: Never Used  Substance Use Topics   Alcohol use: No   Drug use: No   Allergies Adhesive [tape], Celebrex [celecoxib], Ciprofibrate, Cymbalta [duloxetine hcl], Gabitril [tiagabine], Lyrica [pregabalin], Neurontin [gabapentin], Nexium  [esomeprazole ], Nsaids, Shrimp [shellfish allergy], Azactam  [aztreonam ], Azelastine  hcl, Ciprofloxacin , Claritin  [loratadine ], Methotrexate  derivatives, Nasacort  [triamcinolone ], Olopatadine , Other,  Sulfamethizole, Zantac [ranitidine hcl], Claritin -d 12 hour [loratadine -pseudoephedrine er], Keflex  [cephalexin ], Penicillins, and Sulfa antibiotics  Review of Systems Review of Systems  Constitutional:  Positive for chills, fatigue and fever.  HENT:  Negative for congestion.   Respiratory:  Positive for cough and shortness of breath.   Gastrointestinal:  Positive for abdominal pain, nausea and vomiting. Negative for blood in stool.  Genitourinary:  Negative for dysuria.  Musculoskeletal:  Positive for arthralgias.  Skin:  Negative for rash.  Neurological:  Negative for syncope and numbness.    Physical Exam Vital Signs  I have reviewed the triage vital signs BP (!) 93/40   Pulse 62   Temp 98.9 F (37.2 C) (Oral)   Resp (!) 27   Ht 5' 4 (1.626 m)   Wt 73.5 kg   SpO2 94%   BMI 27.81 kg/m  Physical Exam Vitals and nursing note reviewed.  Constitutional:      General: She is not in acute distress.    Appearance: Normal appearance.  HENT:     Head: Normocephalic and atraumatic.     Right Ear: External ear normal.     Left Ear: External ear normal.     Nose: Nose normal.     Mouth/Throat:     Mouth: Mucous membranes are dry.  Eyes:     General: No scleral icterus.       Right eye: No discharge.        Left eye: No discharge.  Cardiovascular:     Rate and Rhythm: Regular rhythm. Tachycardia present.     Pulses: Normal pulses.     Heart sounds: Normal heart sounds.  Pulmonary:     Effort: Pulmonary effort is normal. Tachypnea present. No respiratory distress.     Breath  sounds: No stridor. Decreased breath sounds present.  Abdominal:     General: Abdomen is flat. There is no distension.     Palpations: Abdomen is soft.     Tenderness: There is abdominal tenderness.    Musculoskeletal:     Cervical back: No rigidity.     Right lower leg: No edema.     Left lower leg: No edema.  Skin:    General: Skin is warm and dry.     Capillary Refill: Capillary refill takes  less than 2 seconds.  Neurological:     Mental Status: She is alert and oriented to person, place, and time.     GCS: GCS eye subscore is 4. GCS verbal subscore is 5. GCS motor subscore is 6.  Psychiatric:        Mood and Affect: Mood normal.        Behavior: Behavior normal. Behavior is cooperative.     ED Results and Treatments Labs (all labs ordered are listed, but only abnormal results are displayed) Labs Reviewed  COMPREHENSIVE METABOLIC PANEL - Abnormal; Notable for the following components:      Result Value   Potassium 3.2 (*)    Glucose, Bld 107 (*)    Creatinine, Ser 1.30 (*)    Calcium  8.8 (*)    Albumin  3.3 (*)    AST 50 (*)    GFR, Estimated 39 (*)    All other components within normal limits  CBC WITH DIFFERENTIAL/PLATELET - Abnormal; Notable for the following components:   RDW 17.0 (*)    All other components within normal limits  PROTIME-INR - Abnormal; Notable for the following components:   Prothrombin Time 15.9 (*)    INR 1.3 (*)    All other components within normal limits  URINALYSIS, W/ REFLEX TO CULTURE (INFECTION SUSPECTED) - Abnormal; Notable for the following components:   Hgb urine dipstick MODERATE (*)    Protein, ur 100 (*)    All other components within normal limits  I-STAT CG4 LACTIC ACID, ED - Abnormal; Notable for the following components:   Lactic Acid, Venous 2.7 (*)    All other components within normal limits  I-STAT CG4 LACTIC ACID, ED - Abnormal; Notable for the following components:   Lactic Acid, Venous 2.1 (*)    All other components within normal limits  RESP PANEL BY RT-PCR (RSV, FLU A&B, COVID)  [id number]  CULTURE, BLOOD (ROUTINE X 2)  CULTURE, BLOOD (ROUTINE X 2)  RESPIRATORY PANEL BY PCR  APTT  AMMONIA  MAGNESIUM   PHOSPHORUS  CBC  CREATININE, SERUM  BRAIN NATRIURETIC PEPTIDE  PROCALCITONIN  CORTISOL  BLOOD GAS, ARTERIAL  LACTIC ACID, PLASMA  LACTIC ACID, PLASMA  LIPASE, BLOOD  TYPE AND SCREEN                                                                                                                           Radiology ECHOCARDIOGRAM COMPLETE Result Date: 12/21/2023  ECHOCARDIOGRAM REPORT   Patient Name:   MIGNONNE AFONSO Date of Exam: 12/21/2023 Medical Rec #:  [id number]          Height:       64.0 in Accession #:    [id number]         Weight:       162.0 lb Date of Birth:  1932/04/29          BSA:          1.789 m Patient Age:    89 years           BP:           97/58 mmHg Patient Gender: F                  HR:           76 bpm. Exam Location:  Inpatient Procedure: 2D Echo, Cardiac Doppler and Color Doppler STAT ECHO Indications:    Shock  History:        Patient has prior history of Echocardiogram examinations, most                 recent 05/22/2022. CHF, Arrythmias:Tachycardia,                 Signs/Symptoms:Shortness of Breath; Risk Factors:Dyslipidemia                 and Hypertension.  Sonographer:    Juanita Shaw Referring Phys: [id number] JONATHAN B DEWALD IMPRESSIONS  1. Left ventricular ejection fraction, by estimation, is 55 to 60%. The left ventricle has normal function. The left ventricle has no regional wall motion abnormalities. Left ventricular diastolic parameters are consistent with Grade I diastolic dysfunction (impaired relaxation).  2. Right ventricular systolic function is normal. The right ventricular size is normal. Tricuspid regurgitation signal is inadequate for assessing PA pressure.  3. The mitral valve is normal in structure. No evidence of mitral valve regurgitation. No evidence of mitral stenosis.  4. The aortic valve is tricuspid. There is mild calcification of the aortic valve. Aortic valve regurgitation is not visualized. No aortic stenosis is present.  5. The inferior vena cava is normal in size with greater than 50% respiratory variability, suggesting right atrial pressure of 3 mmHg. FINDINGS  Left Ventricle: Left ventricular ejection fraction, by estimation, is 55 to 60%. The left  ventricle has normal function. The left ventricle has no regional wall motion abnormalities. The left ventricular internal cavity size was normal in size. There is  no left ventricular hypertrophy. Left ventricular diastolic parameters are consistent with Grade I diastolic dysfunction (impaired relaxation). Right Ventricle: The right ventricular size is normal. No increase in right ventricular wall thickness. Right ventricular systolic function is normal. Tricuspid regurgitation signal is inadequate for assessing PA pressure. Left Atrium: Left atrial size was normal in size. Right Atrium: Right atrial size was normal in size. Pericardium: There is no evidence of pericardial effusion. Mitral Valve: The mitral valve is normal in structure. No evidence of mitral valve regurgitation. No evidence of mitral valve stenosis. MV peak gradient, 2.5 mmHg. The mean mitral valve gradient is 1.0 mmHg. Tricuspid Valve: The tricuspid valve is normal in structure. Tricuspid valve regurgitation is not demonstrated. Aortic Valve: The aortic valve is tricuspid. There is mild calcification of the aortic valve. Aortic valve regurgitation is not visualized. No aortic stenosis is present. Aortic valve mean gradient measures 6.0 mmHg. Aortic valve peak gradient measures 11.4 mmHg. Aortic valve area, by  VTI measures 2.06 cm. Pulmonic Valve: The pulmonic valve was normal in structure. Pulmonic valve regurgitation is not visualized. Aorta: The aortic root is normal in size and structure. Venous: The inferior vena cava is normal in size with greater than 50% respiratory variability, suggesting right atrial pressure of 3 mmHg. IAS/Shunts: No atrial level shunt detected by color flow Doppler.  LEFT VENTRICLE PLAX 2D LVIDd:         3.90 cm      Diastology LVIDs:         3.00 cm      LV e' medial:    6.85 cm/s LV PW:         0.70 cm      LV E/e' medial:  9.8 LV IVS:        0.80 cm      LV e' lateral:   8.05 cm/s LVOT diam:     1.80 cm      LV E/e'  lateral: 8.3 LV SV:         64 LV SV Index:   36 LVOT Area:     2.54 cm  LV Volumes (MOD) LV vol d, MOD A2C: 104.0 ml LV vol d, MOD A4C: 112.0 ml LV vol s, MOD A2C: 34.3 ml LV vol s, MOD A4C: 50.3 ml LV SV MOD A2C:     69.7 ml LV SV MOD A4C:     112.0 ml LV SV MOD BP:      67.4 ml RIGHT VENTRICLE             IVC RV Basal diam:  3.80 cm     IVC diam: 1.70 cm RV Mid diam:    3.70 cm RV S prime:     13.50 cm/s TAPSE (M-mode): 2.6 cm LEFT ATRIUM             Index        RIGHT ATRIUM          Index LA diam:        3.10 cm 1.73 cm/m   RA Area:     9.11 cm LA Vol (A2C):   22.4 ml 12.52 ml/m  RA Volume:   17.40 ml 9.73 ml/m LA Vol (A4C):   25.1 ml 14.03 ml/m LA Biplane Vol: 25.9 ml 14.48 ml/m  AORTIC VALVE                     PULMONIC VALVE AV Area (Vmax):    2.02 cm      PV Vmax:       0.78 m/s AV Area (Vmean):   1.51 cm      PV Peak grad:  2.4 mmHg AV Area (VTI):     2.06 cm AV Vmax:           169.00 cm/s AV Vmean:          119.000 cm/s AV VTI:            0.309 m AV Peak Grad:      11.4 mmHg AV Mean Grad:      6.0 mmHg LVOT Vmax:         134.00 cm/s LVOT Vmean:        70.800 cm/s LVOT VTI:          0.250 m LVOT/AV VTI ratio: 0.81  AORTA Ao Root diam: 2.80 cm Ao Asc diam:  2.90 cm MITRAL VALVE MV Area (PHT): 3.12 cm    SHUNTS  MV Area VTI:   2.37 cm    Systemic VTI:  0.25 m MV Peak grad:  2.5 mmHg    Systemic Diam: 1.80 cm MV Mean grad:  1.0 mmHg MV Vmax:       0.79 m/s MV Vmean:      51.8 cm/s MV Decel Time: 243 msec MV E velocity: 67.00 cm/s MV A velocity: 62.90 cm/s MV E/A ratio:  1.07 Dalton McleanMD Electronically signed by Ezra Kanner Signature Date/Time: 12/21/2023/3:10:49 PM    Final    DG Hip Unilat W or Wo Pelvis 2-3 Views Left Result Date: 12/21/2023 CLINICAL DATA:  Left hip pain. EXAM: DG HIP (WITH OR WITHOUT PELVIS) 2-3V LEFT COMPARISON:  None Available. FINDINGS: There is no acute fracture or dislocation.Evaluation for fracture is limited due to osteopenia and body habitus. There is moderate  left and severe right hip osteoarthritic changes. There is near complete loss of right hip joint space with near bone on bone contact. Excreted contrast noted in the urinary bladder. The soft tissues are unremarkable. IMPRESSION: 1. No acute fracture or dislocation. 2. Moderate left and severe right hip osteoarthritis. Electronically Signed   By: Vanetta Chou M.D.   On: 12/21/2023 12:38   CT Angio Chest PE W and/or Wo Contrast Result Date: 12/21/2023 CLINICAL DATA:  Pulmonary embolism (PE) suspected, high prob; Epigastric pain sepsis. Nausea and lethargy. EXAM: CT ANGIOGRAPHY CHEST CT ABDOMEN AND PELVIS WITH CONTRAST TECHNIQUE: Multidetector CT imaging of the chest was performed using the standard protocol during bolus administration of intravenous contrast. Multiplanar CT image reconstructions and MIPs were obtained to evaluate the vascular anatomy. Multidetector CT imaging of the abdomen and pelvis was performed using the standard protocol during bolus administration of intravenous contrast. RADIATION DOSE REDUCTION: This exam was performed according to the departmental dose-optimization program which includes automated exposure control, adjustment of the mA and/or kV according to patient size and/or use of iterative reconstruction technique. CONTRAST:  75mL OMNIPAQUE  IOHEXOL  350 MG/ML SOLN COMPARISON:  CT scan abdomen and pelvis from 02/12/2023. FINDINGS: CTA CHEST FINDINGS Cardiovascular: No evidence of embolism to the proximal subsegmental pulmonary artery level. Normal cardiac size. No pericardial effusion. No aortic aneurysm. There are coronary artery calcifications, in keeping with coronary artery disease. Mediastinum/Nodes: Visualized thyroid  gland appears grossly unremarkable. No solid / cystic mediastinal masses. The esophagus is nondistended precluding optimal assessment. There are few mildly prominent mediastinal and hilar lymph nodes, which do not meet the size criteria for lymphadenopathy. No  axillary lymphadenopathy by size criteria. Lungs/Pleura: The central tracheo-bronchial tree is patent. There is mild, smooth, circumferential thickening of the segmental and subsegmental bronchial walls, throughout bilateral lungs, which is nonspecific. Findings are most commonly seen with bronchitis or reactive airway disease, such as asthma. There are occlusive and nonocclusive filling defects in the distal segmental and subsegmental bronchi, throughout bilateral lungs, likely due to mucus/secretions or aspiration. There are multiple solid noncalcified nodules throughout bilateral lungs with largest in the anterior left upper lobe measuring up to 1.8 x 1.8 cm. This nodule exhibits small central cavitation. Rest of the nodules are smaller in size and do not exhibit cavitations. These are compatible with metastases. No consolidation, pleural effusion or pneumothorax. There are dependent changes in bilateral lungs. Musculoskeletal: The visualized soft tissues of the chest wall are grossly unremarkable. No suspicious osseous lesions. There are mild multilevel degenerative changes in the visualized spine. Review of the MIP images confirms the above findings. CT ABDOMEN and PELVIS FINDINGS Hepatobiliary: The liver is  normal in size. Non-cirrhotic configuration. No suspicious mass. No intrahepatic or extrahepatic bile duct dilation. No calcified gallstones. Normal gallbladder wall thickness. No pericholecystic inflammatory changes. Pancreas: Unremarkable. No pancreatic ductal dilatation or surrounding inflammatory changes. Spleen: Within normal limits. No focal lesion. Adrenals/Urinary Tract: Adrenal glands are unremarkable. No suspicious renal mass. There is a 2.3 x 2.4 cm simple cyst in the left kidney interpolar region, anteriorly. There are additional several smaller cysts throughout bilateral kidneys. No hydronephrosis. No renal or ureteric calculi. Unremarkable urinary bladder. Stomach/Bowel: There is a small  sliding hiatal hernia. No disproportionate dilation of the small or large bowel loops. No evidence of abnormal bowel wall thickening or inflammatory changes. The appendix is unremarkable. There are scattered diverticula mainly in the sigmoid colon, without imaging signs of diverticulitis. Vascular/Lymphatic: No ascites or pneumoperitoneum. No abdominal or pelvic lymphadenopathy, by size criteria. No aneurysmal dilation of the major abdominal arteries. There are mild peripheral atherosclerotic vascular calcifications of the aorta and its major branches. Reproductive: The uterus is unremarkable. There is a 3.5 x 4.3 cm hypoattenuating structure in the left adnexa, most likely ovarian in etiology. The structure exhibits fluid attenuation and has no discrete mural nodule. Even though this is incompletely characterized on the current exam, favored to represent a senescent ovarian cyst. Annual follow-up ultrasound evaluation is recommended to documenting stability. Other: There is a tiny fat containing umbilical hernia. The soft tissues and abdominal wall are otherwise unremarkable. Musculoskeletal: No suspicious osseous lesions. There are moderate multilevel degenerative changes in the visualized spine. Review of the MIP images confirms the above findings. IMPRESSION: 1. No embolism to the proximal subsegmental pulmonary artery level. 2. There are multiple solid noncalcified lung nodules, compatible with metastases. 3. No acute inflammatory process identified within the abdomen or pelvis. 4. Multiple other nonacute observations, as described above. Electronically Signed   By: Ree Molt M.D.   On: 12/21/2023 11:18   CT ABDOMEN PELVIS W CONTRAST Result Date: 12/21/2023 CLINICAL DATA:  Pulmonary embolism (PE) suspected, high prob; Epigastric pain sepsis. Nausea and lethargy. EXAM: CT ANGIOGRAPHY CHEST CT ABDOMEN AND PELVIS WITH CONTRAST TECHNIQUE: Multidetector CT imaging of the chest was performed using the  standard protocol during bolus administration of intravenous contrast. Multiplanar CT image reconstructions and MIPs were obtained to evaluate the vascular anatomy. Multidetector CT imaging of the abdomen and pelvis was performed using the standard protocol during bolus administration of intravenous contrast. RADIATION DOSE REDUCTION: This exam was performed according to the departmental dose-optimization program which includes automated exposure control, adjustment of the mA and/or kV according to patient size and/or use of iterative reconstruction technique. CONTRAST:  75mL OMNIPAQUE  IOHEXOL  350 MG/ML SOLN COMPARISON:  CT scan abdomen and pelvis from 02/12/2023. FINDINGS: CTA CHEST FINDINGS Cardiovascular: No evidence of embolism to the proximal subsegmental pulmonary artery level. Normal cardiac size. No pericardial effusion. No aortic aneurysm. There are coronary artery calcifications, in keeping with coronary artery disease. Mediastinum/Nodes: Visualized thyroid  gland appears grossly unremarkable. No solid / cystic mediastinal masses. The esophagus is nondistended precluding optimal assessment. There are few mildly prominent mediastinal and hilar lymph nodes, which do not meet the size criteria for lymphadenopathy. No axillary lymphadenopathy by size criteria. Lungs/Pleura: The central tracheo-bronchial tree is patent. There is mild, smooth, circumferential thickening of the segmental and subsegmental bronchial walls, throughout bilateral lungs, which is nonspecific. Findings are most commonly seen with bronchitis or reactive airway disease, such as asthma. There are occlusive and nonocclusive filling defects in the distal segmental and subsegmental  bronchi, throughout bilateral lungs, likely due to mucus/secretions or aspiration. There are multiple solid noncalcified nodules throughout bilateral lungs with largest in the anterior left upper lobe measuring up to 1.8 x 1.8 cm. This nodule exhibits small central  cavitation. Rest of the nodules are smaller in size and do not exhibit cavitations. These are compatible with metastases. No consolidation, pleural effusion or pneumothorax. There are dependent changes in bilateral lungs. Musculoskeletal: The visualized soft tissues of the chest wall are grossly unremarkable. No suspicious osseous lesions. There are mild multilevel degenerative changes in the visualized spine. Review of the MIP images confirms the above findings. CT ABDOMEN and PELVIS FINDINGS Hepatobiliary: The liver is normal in size. Non-cirrhotic configuration. No suspicious mass. No intrahepatic or extrahepatic bile duct dilation. No calcified gallstones. Normal gallbladder wall thickness. No pericholecystic inflammatory changes. Pancreas: Unremarkable. No pancreatic ductal dilatation or surrounding inflammatory changes. Spleen: Within normal limits. No focal lesion. Adrenals/Urinary Tract: Adrenal glands are unremarkable. No suspicious renal mass. There is a 2.3 x 2.4 cm simple cyst in the left kidney interpolar region, anteriorly. There are additional several smaller cysts throughout bilateral kidneys. No hydronephrosis. No renal or ureteric calculi. Unremarkable urinary bladder. Stomach/Bowel: There is a small sliding hiatal hernia. No disproportionate dilation of the small or large bowel loops. No evidence of abnormal bowel wall thickening or inflammatory changes. The appendix is unremarkable. There are scattered diverticula mainly in the sigmoid colon, without imaging signs of diverticulitis. Vascular/Lymphatic: No ascites or pneumoperitoneum. No abdominal or pelvic lymphadenopathy, by size criteria. No aneurysmal dilation of the major abdominal arteries. There are mild peripheral atherosclerotic vascular calcifications of the aorta and its major branches. Reproductive: The uterus is unremarkable. There is a 3.5 x 4.3 cm hypoattenuating structure in the left adnexa, most likely ovarian in etiology. The  structure exhibits fluid attenuation and has no discrete mural nodule. Even though this is incompletely characterized on the current exam, favored to represent a senescent ovarian cyst. Annual follow-up ultrasound evaluation is recommended to documenting stability. Other: There is a tiny fat containing umbilical hernia. The soft tissues and abdominal wall are otherwise unremarkable. Musculoskeletal: No suspicious osseous lesions. There are moderate multilevel degenerative changes in the visualized spine. Review of the MIP images confirms the above findings. IMPRESSION: 1. No embolism to the proximal subsegmental pulmonary artery level. 2. There are multiple solid noncalcified lung nodules, compatible with metastases. 3. No acute inflammatory process identified within the abdomen or pelvis. 4. Multiple other nonacute observations, as described above. Electronically Signed   By: Ree Molt M.D.   On: 12/21/2023 11:18   DG Chest Port 1 View Result Date: 12/21/2023 CLINICAL DATA:  89 year old female with possible sepsis. EXAM: PORTABLE CHEST 1 VIEW COMPARISON:  Portable chest 02/12/2023 and earlier. FINDINGS: Portable AP semi upright view at 0844 hours. Low lung volumes not significantly changed from last year. Stable cardiac size and mediastinal contours. Visualized tracheal air column is within normal limits. Allowing for portable technique the lungs are clear. Chronic cervical ACDF. Stable visualized osseous structures. Negative visible bowel gas. IMPRESSION: Stable with chronically low lung volumes. No acute cardiopulmonary abnormality. Electronically Signed   By: VEAR Hurst M.D.   On: 12/21/2023 09:02    Pertinent labs & imaging results that were available during my care of the patient were reviewed by me and considered in my medical decision making (see MDM for details).  Medications Ordered in ED Medications  lactated ringers  infusion ( Intravenous New Bag/Given 12/21/23 0840)  0.9 %  sodium chloride   infusion (has no administration in time range)  norepinephrine  (LEVOPHED ) 4mg  in (0.016 mg/mL) premix infusion (2 mcg/min Intravenous New Bag/Given 12/21/23 1205)  potassium chloride  10 mEq in 100 mL IVPB (has no administration in time range)  docusate sodium  (COLACE) capsule 100 mg (has no administration in time range)  polyethylene glycol (MIRALAX  / GLYCOLAX ) packet 17 g (has no administration in time range)  heparin  injection 5,000 Units (5,000 Units Subcutaneous Given 12/21/23 1556)  azithromycin  (ZITHROMAX ) 500 mg in sodium chloride  0.9 % 250 mL IVPB (has no administration in time range)  hydrocortisone  sodium succinate  (SOLU-CORTEF ) 100 MG injection 100 mg (100 mg Intravenous Given 12/21/23 1555)  vancomycin  (VANCOREADY) IVPB 500 mg/100 mL (has no administration in time range)  vancomycin  (VANCOREADY) IVPB 1250 mg/250 mL (has no administration in time range)  ceFEPIme  (MAXIPIME ) 2 g in sodium chloride  0.9 % 100 mL IVPB (has no administration in time range)  lactated ringers  bolus 1,000 mL (0 mLs Intravenous Stopped 12/21/23 1026)  ceFEPIme  (MAXIPIME ) 2 g in sodium chloride  0.9 % 100 mL IVPB (0 g Intravenous Stopped 12/21/23 0957)  metroNIDAZOLE  (FLAGYL ) IVPB 500 mg (0 mg Intravenous Stopped 12/21/23 1025)  acetaminophen  (TYLENOL ) tablet 1,000 mg (1,000 mg Oral Given 12/21/23 0939)  ondansetron  (ZOFRAN ) injection 4 mg (4 mg Intravenous Given 12/21/23 0939)  pantoprazole  (PROTONIX ) injection 40 mg (40 mg Intravenous Given 12/21/23 0939)  vancomycin  (VANCOCIN ) IVPB 1000 mg/200 mL premix (1,000 mg Intravenous New Bag/Given 12/21/23 1121)  iohexol  (OMNIPAQUE ) 350 MG/ML injection 75 mL (75 mLs Intravenous Contrast Given 12/21/23 1041)  lactated ringers  bolus 1,000 mL (1,000 mLs Intravenous New Bag/Given 12/21/23 1212)  norepinephrine  (LEVOPHED ) 4-5 MG/250ML-% infusion SOLN (  Duplicate 12/21/23 1556)                                                                                                                                      Procedures .Critical Care  Performed by: Elnor Jayson LABOR, DO Authorized by: Elnor Jayson LABOR, DO   Critical care provider statement:    Critical care time (minutes):  42   Critical care time was exclusive of:  Separately billable procedures and treating other patients   Critical care was necessary to treat or prevent imminent or life-threatening deterioration of the following conditions:  Circulatory failure, sepsis and shock   Critical care was time spent personally by me on the following activities:  Development of treatment plan with patient or surrogate, discussions with consultants, evaluation of patient's response to treatment, examination of patient, ordering and review of laboratory studies, ordering and review of radiographic studies, ordering and performing treatments and interventions, pulse oximetry, re-evaluation of patient's condition, review of old charts and obtaining history from patient or surrogate   Care discussed with: admitting provider     (including critical care time)  Medical Decision Making / ED Course    Medical Decision Making:  Markea ALAILAH SAFLEY is a 89 y.o. female with past medical history as below, significant for fibromyalgia, GERD, RA, hypertension, metabolic encephalopathy, asthma, adrenal failure who presents to the ED with complaint of nausea, vomiting, fever. The complaint involves an extensive differential diagnosis and also carries with it a high risk of complications and morbidity.  Serious etiology was considered. Ddx includes but is not limited to: Differential diagnosis for adult fever includes but is not exclusive to community-acquired pneumonia, urinary tract infection, acute cholecystitis, viral syndrome, cellulitis, tick bourne disease,  decubitus ulcer, necrotizing fasciitis, meningitis, encephalitis, influenza, etc.   Complete initial physical exam performed, notably the patient was in mild respiratory distress,  requiring supplemental oxygen.  Fever noted 3.3.  Tachypnea, tachycardia. Reviewed and confirmed nursing documentation for past medical history, family history, social history.  Vital signs reviewed.      Clinical Course as of 12/21/23 1605  Fri Dec 21, 2023  0917 Lactic Acid, Venous(!!): 2.7 IVF in process, hemodynamics stable [SG]  0918 ECHO 6/23, LVEF 60-65%, G1DD  [SG]  1127 Multiple masses in the chest c/w metastatic disease on CT imaging  [SG]  1128 Creatinine(!): 1.30 AKI, baseline around 1.0 [SG]  1202 Patient has become hypotensive, blood pressure 80s to 90s systolic, second liter is infusing.  Will start peripheral Levophed .  Continue IV fluid resuscitation.  Lactic acid has improved.  No clear source of infection identified at this time [SG]  1253 No clear source of infection at this time, continue abx, continue resuscitation. Plan admission [SG]  1323 She has SIRS, unclear source at this time. She is responding well to levophed . Continue Abx, plan admission. [SG]    Clinical Course User Index [SG] Elnor Jayson LABOR, DO    Brief summary: 89 year old female history as above here from SNF secondary to fever, nausea vomiting.  Patient meets sepsis criteria on arrival, possible pulmonary versus intra-abdominal source.  Sepsis bundle ordered.  Start collecting blood cultures, broad-spectrum antibiotics, fluid resuscitation.  Give Tylenol .  Antiemetic.  Labs reviewed, unclear source infection.  She has AKI. Resuscitation process, continue Levophed  for continue supplemental oxygen.  Admit to PCCM, Dr. Kara secondary to vasopressor requirement              Additional history obtained: -Additional history obtained from ems -External records from outside source obtained and reviewed including: Chart review including previous notes, labs, imaging, consultation notes including  Home medications, prior labs and imaging   Lab Tests: -I ordered, reviewed, and interpreted  labs.   The pertinent results include:   Labs Reviewed  COMPREHENSIVE METABOLIC PANEL - Abnormal; Notable for the following components:      Result Value   Potassium 3.2 (*)    Glucose, Bld 107 (*)    Creatinine, Ser 1.30 (*)    Calcium  8.8 (*)    Albumin  3.3 (*)    AST 50 (*)    GFR, Estimated 39 (*)    All other components within normal limits  CBC WITH DIFFERENTIAL/PLATELET - Abnormal; Notable for the following components:   RDW 17.0 (*)    All other components within normal limits  PROTIME-INR - Abnormal; Notable for the following components:   Prothrombin Time 15.9 (*)    INR 1.3 (*)    All other components within normal limits  URINALYSIS, W/ REFLEX TO CULTURE (INFECTION SUSPECTED) - Abnormal; Notable for the following components:   Hgb urine dipstick MODERATE (*)    Protein, ur 100 (*)    All other  components within normal limits  I-STAT CG4 LACTIC ACID, ED - Abnormal; Notable for the following components:   Lactic Acid, Venous 2.7 (*)    All other components within normal limits  I-STAT CG4 LACTIC ACID, ED - Abnormal; Notable for the following components:   Lactic Acid, Venous 2.1 (*)    All other components within normal limits  RESP PANEL BY RT-PCR (RSV, FLU A&B, COVID)  [id number]  CULTURE, BLOOD (ROUTINE X 2)  CULTURE, BLOOD (ROUTINE X 2)  RESPIRATORY PANEL BY PCR  APTT  AMMONIA  MAGNESIUM   PHOSPHORUS  CBC  CREATININE, SERUM  BRAIN NATRIURETIC PEPTIDE  PROCALCITONIN  CORTISOL  BLOOD GAS, ARTERIAL  LACTIC ACID, PLASMA  LACTIC ACID, PLASMA  LIPASE, BLOOD  TYPE AND SCREEN    Notable for as above, AKI  EKG   EKG Interpretation Date/Time:  Friday December 21 2023 08:33:38 EST Ventricular Rate:  109 PR Interval:  166 QRS Duration:  125 QT Interval:  351 QTC Calculation: 473 R Axis:   -65  Text Interpretation: Sinus tachycardia Nonspecific IVCD with LAD Left ventricular hypertrophy  Interpretation limited secondary to artifact similar to prior  Confirmed  by Elnor Savant ([id number]) on 12/21/2023 12:51:46 PM         Imaging Studies ordered: I ordered imaging studies including CT pe, ct a/p , cxr , xr hip I independently visualized the following imaging with scope of interpretation limited to determining acute life threatening conditions related to emergency care; findings noted above I independently visualized and interpreted imaging. I agree with the radiologist interpretation   Medicines ordered and prescription drug management: Meds ordered this encounter  Medications   lactated ringers  infusion   lactated ringers  bolus 1,000 mL    Reason 30 mL/kg dose is not being ordered:   First Lactic Acid Pending   ceFEPIme  (MAXIPIME ) 2 g in sodium chloride  0.9 % 100 mL IVPB    Antibiotic Indication::   Other Indication (list below)    Other Indication::   intra-abdominal infection   metroNIDAZOLE  (FLAGYL ) IVPB 500 mg    Antibiotic Indication::   Intra-abdominal Infection   acetaminophen  (TYLENOL ) tablet 1,000 mg   ondansetron  (ZOFRAN ) injection 4 mg   pantoprazole  (PROTONIX ) injection 40 mg   vancomycin  (VANCOCIN ) IVPB 1000 mg/200 mL premix    Indication::   Sepsis   iohexol  (OMNIPAQUE ) 350 MG/ML injection 75 mL   lactated ringers  bolus 1,000 mL   DISCONTD: morphine  (PF) 2 MG/ML injection 2 mg   0.9 %  sodium chloride  infusion   norepinephrine  (LEVOPHED ) 4mg  in (0.016 mg/mL) premix infusion    IV Access:   Peripheral   norepinephrine  (LEVOPHED ) 4-5 MG/250ML-% infusion SOLN    Kiser, Cameron G: cabinet override   potassium chloride  10 mEq in 100 mL IVPB   docusate sodium  (COLACE) capsule 100 mg   polyethylene glycol (MIRALAX  / GLYCOLAX ) packet 17 g   heparin  injection 5,000 Units   azithromycin  (ZITHROMAX ) 500 mg in sodium chloride  0.9 % 250 mL IVPB    Antibiotic Indication::   CAP   hydrocortisone  sodium succinate  (SOLU-CORTEF ) 100 MG injection 100 mg    IV hydrocortisone  will be converted to either a q8h or q12h frequency with the  same total daily dose (TDD).  Ordered Dose: 1 to 200 mg TDD; convert to: TDD div q12h.  Ordered Dose: 201 to 300 mg TDD; convert to: TDD div q8h.  Ordered Dose: >300 mg TDD; DAW.   vancomycin  (VANCOREADY) IVPB 500 mg/100 mL  Indication::   Sepsis   vancomycin  (VANCOREADY) IVPB 1250 mg/250 mL    Indication::   Sepsis   ceFEPIme  (MAXIPIME ) 2 g in sodium chloride  0.9 % 100 mL IVPB    Antibiotic Indication::   Other Indication (list below)    Other Indication::   intra-abdominal infection    -I have reviewed the patients home medicines and have made adjustments as needed   Consultations Obtained: na   Cardiac Monitoring: The patient was maintained on a cardiac monitor.  I personally viewed and interpreted the cardiac monitored which showed an underlying rhythm of: nsr Continuous pulse oximetry interpreted by myself, 97% on 4L.    Social Determinants of Health:  Diagnosis or treatment significantly limited by social determinants of health: former smoker   Reevaluation: After the interventions noted above, I reevaluated the patient and found that they have improved  Co morbidities that complicate the patient evaluation  Past Medical History:  Diagnosis Date   Adrenal failure (HCC)    Arthritis    Asthma    Cancer (HCC)    Cataract    Closed nondisplaced fracture of fifth right metatarsal bone 09/18/2017   Diverticulitis    Per patient   E coli bacteremia    Fibromyalgia 2008   GERD (gastroesophageal reflux disease) 12/18/2016   HCAP (healthcare-associated pneumonia) 02/03/2018   Hyperlipidemia 10/13/2021   Osteoporosis    RA (rheumatoid arthritis) (HCC)    Recurrent upper respiratory infection (URI)    Sepsis due to urinary tract infection (HCC) 01/18/2018   Urticaria       Dispostion: Disposition decision including need for hospitalization was considered, and patient admitted to the hospital.    Final Clinical Impression(s) / ED Diagnoses Final diagnoses:   Sepsis, due to unspecified organism, unspecified whether acute organ dysfunction present (HCC)  Hypoxia  AKI (acute kidney injury) (HCC)  Nausea and vomiting, unspecified vomiting type        Elnor Jayson LABOR, DO 12/21/23 1605    Elnor Jayson LABOR, DO 12/21/23 1606

## 2023-12-21 NOTE — Assessment & Plan Note (Signed)
Epinephrine prn, Allegra, Flonase, Singulair 

## 2023-12-21 NOTE — Assessment & Plan Note (Signed)
Rheumatology, off Alendronate, on Vit D, t score -1.8 06/06/21 

## 2023-12-21 NOTE — Assessment & Plan Note (Signed)
R ankle, stable, on Colchicine

## 2023-12-21 NOTE — Assessment & Plan Note (Signed)
 Chronic diastolic heart failure, DOE, on and off wheezing, cough, occasional yellow phlegm in am. CXR no evidence of acute infection 10/2022. EF 60-65% 05/22/22, hold Torsemide until GI symptoms resolve/5 days, on Amlodipine

## 2023-12-21 NOTE — Assessment & Plan Note (Signed)
 Blood pressure is controlled, on Amlodipine. Bun/creat 17/1.3 12/21/23

## 2023-12-21 NOTE — Assessment & Plan Note (Signed)
 nausea, vomiting, and diarrhea x 1 days, afebrile, limited oral intake, c/o abd discomfort.  CBC/diff, CMP/eGFR Hold Torsemide/Kcl x 5 days Zofran 4mg  tid with meals x72hrs, then prn x 10 days Encourage oral fluid intake VS qshift x 72hrs.

## 2023-12-21 NOTE — Progress Notes (Signed)
 A consult was received from an ED physician for vancomycin  per pharmacy dosing (for an indication other than meningitis). The patient's profile has been reviewed for ht/wt/allergies/indication/available labs. A one time order has been placed for the above antibiotics.  Further antibiotics/pharmacy consults should be ordered by admitting physician if indicated.                       Bard Jeans, PharmD, BCPS [phone] 12/21/2023, 10:32 AM

## 2023-12-21 NOTE — Assessment & Plan Note (Signed)
 Venous insufficiency/Edema ,  Torsemide, Kcl on hold 2/2 acute gastritis.

## 2023-12-21 NOTE — Progress Notes (Signed)
  Echocardiogram 2D Echocardiogram has been performed.  Ocie Doyne RDCS 12/21/2023, 3:10 PM

## 2023-12-21 NOTE — Assessment & Plan Note (Signed)
RA/fibromyalgia, taking steroids, and Tramadol, TSH 0.865 02/27/22 

## 2023-12-21 NOTE — Assessment & Plan Note (Signed)
Hx of DVT, venous US BLE was negative for DVT while in hospital. Off Eliquis.

## 2023-12-21 NOTE — Progress Notes (Signed)
 Pharmacy Antibiotic Note  Eileen Wilkerson is a 89 y.o. female admitted on 12/21/2023 with sepsis.  Pharmacy has been consulted for Cefepime  and Vancomycin  dosing.  Plan: Cefepime  2g IV q24h Vancomycin  1000mg  IV already given, give additional 500mg  IV once for total loading dose 1500mg .  Followed by Vanc 1250 mg IV q48h.   Measure Vanc levels as needed.  Goal AUC = 400 - 550. Follow up renal function, culture results, and clinical course.   Height: 5' 4 (162.6 cm) Weight: 73.5 kg (162 lb 0.6 oz) IBW/kg (Calculated) : 54.7  Temp (24hrs), Avg:100.6 F (38.1 C), Min:97.7 F (36.5 C), Max:103.3 F (39.6 C)  Recent Labs  Lab 12/21/23 0900 12/21/23 0912 12/21/23 1140  WBC 9.9  --   --   CREATININE 1.30*  --   --   LATICACIDVEN  --  2.7* 2.1*    Estimated Creatinine Clearance: 28.8 mL/min (A) (by C-G formula based on SCr of 1.3 mg/dL (H)).    Allergies  Allergen Reactions   Adhesive [Tape] Rash   Celebrex [Celecoxib] Hives   Ciprofibrate Nausea Only   Cymbalta [Duloxetine Hcl] Swelling   Gabitril [Tiagabine] Swelling   Lyrica [Pregabalin] Swelling   Neurontin [Gabapentin] Swelling   Nexium  [Esomeprazole ] Rash   Nsaids Rash   Shrimp [Shellfish Allergy] Anaphylaxis    Per patient shrimp only   Azactam  [Aztreonam ]     Hand swelling    Azelastine  Hcl     Rash    Ciprofloxacin  Other (See Comments)    dizziness   Claritin  [Loratadine ]     Irritability Nervousness    Methotrexate  Derivatives    Nasacort  [Triamcinolone ]     Dizzy    Olopatadine  Other (See Comments)    Pain and lethargy    Other    Sulfamethizole Other (See Comments)    unknown   Zantac [Ranitidine Hcl] Other (See Comments)    unknown   Claritin -D 12 Hour [Loratadine -Pseudoephedrine Er] Anxiety   Keflex  [Cephalexin ] Nausea And Vomiting    Tolerated Ancef    Penicillins Rash    Injection site reaction. Tolerated cefepime  in past. Also reports tolerating a penicillin infusion after this initial rxn  ~20 yrs ago.  Has patient had a PCN reaction causing immediate rash, facial/tongue/throat swelling, SOB or lightheadedness with hypotension: No Has patient had a PCN reaction causing severe rash involving mucus membranes or skin necrosis: No Has patient had a PCN reaction that required hospitalization No Has patient had a PCN reaction occurring within the last 10 years: No     Sulfa Antibiotics Nausea And Vomiting    Antimicrobials this admission: 1/10 metronidazole  1/10 Cefepime  >>  1/10 Azith >> 1/10 Vanc >>   Dose adjustments this admission:   Microbiology results: 1/10 BCx:  1/10 Resp panel: neg covid, flu, rsv 1/10 Resp panel:    Thank you for allowing pharmacy to be a part of this patient's care.  Wanda Hasting PharmD, BCPS [facility] main pharmacy [phone] 12/21/2023 3:05 PM

## 2023-12-21 NOTE — Assessment & Plan Note (Signed)
 takes Atorvastatin, LDL 77 06/05/23

## 2023-12-21 NOTE — Assessment & Plan Note (Signed)
 Hx of aute sigmoid diverticulitis with pneumoperitoneum, f/u Dr. Sheliah Hatch, diarrhea on and off, negative C-diff while in the past.

## 2023-12-22 ENCOUNTER — Encounter (HOSPITAL_COMMUNITY): Payer: Self-pay | Admitting: Pulmonary Disease

## 2023-12-22 DIAGNOSIS — R571 Hypovolemic shock: Secondary | ICD-10-CM | POA: Diagnosis not present

## 2023-12-22 LAB — CBC
HCT: 34.1 % — ABNORMAL LOW (ref 36.0–46.0)
Hemoglobin: 10.6 g/dL — ABNORMAL LOW (ref 12.0–15.0)
MCH: 29.2 pg (ref 26.0–34.0)
MCHC: 31.1 g/dL (ref 30.0–36.0)
MCV: 93.9 fL (ref 80.0–100.0)
Platelets: 169 10*3/uL (ref 150–400)
RBC: 3.63 MIL/uL — ABNORMAL LOW (ref 3.87–5.11)
RDW: 16.7 % — ABNORMAL HIGH (ref 11.5–15.5)
WBC: 7.5 10*3/uL (ref 4.0–10.5)
nRBC: 0 % (ref 0.0–0.2)

## 2023-12-22 LAB — COMPREHENSIVE METABOLIC PANEL
ALT: 26 U/L (ref 0–44)
AST: 45 U/L — ABNORMAL HIGH (ref 15–41)
Albumin: 2.5 g/dL — ABNORMAL LOW (ref 3.5–5.0)
Alkaline Phosphatase: 57 U/L (ref 38–126)
Anion gap: 9 (ref 5–15)
BUN: 15 mg/dL (ref 8–23)
CO2: 23 mmol/L (ref 22–32)
Calcium: 8.2 mg/dL — ABNORMAL LOW (ref 8.9–10.3)
Chloride: 111 mmol/L (ref 98–111)
Creatinine, Ser: 0.96 mg/dL (ref 0.44–1.00)
GFR, Estimated: 57 mL/min — ABNORMAL LOW (ref >60)
Glucose, Bld: 117 mg/dL — ABNORMAL HIGH (ref 70–99)
Potassium: 3.8 mmol/L (ref 3.5–5.1)
Sodium: 143 mmol/L (ref 135–145)
Total Bilirubin: 0.6 mg/dL (ref 0.0–1.2)
Total Protein: 5.2 g/dL — ABNORMAL LOW (ref 6.5–8.1)

## 2023-12-22 MED ORDER — ACETAMINOPHEN 325 MG PO TABS
650.0000 mg | ORAL_TABLET | Freq: Four times a day (QID) | ORAL | Status: DC | PRN
  Administered 2023-12-22 – 2023-12-26 (×2): 650 mg via ORAL
  Filled 2023-12-22 (×2): qty 2

## 2023-12-22 MED ORDER — SODIUM CHLORIDE 0.9 % IV SOLN
2.0000 g | Freq: Two times a day (BID) | INTRAVENOUS | Status: AC
  Administered 2023-12-22 – 2023-12-25 (×7): 2 g via INTRAVENOUS
  Filled 2023-12-22 (×7): qty 12.5

## 2023-12-22 MED ORDER — CARMEX CLASSIC LIP BALM EX OINT
1.0000 | TOPICAL_OINTMENT | CUTANEOUS | Status: DC | PRN
  Filled 2023-12-22: qty 10

## 2023-12-22 NOTE — Progress Notes (Signed)
 Pharmacy Antibiotic Note  Eileen Wilkerson is a 89 y.o. female admitted on 12/21/2023 with sepsis.  Pharmacy has been consulted for Cefepime  and Vancomycin  dosing.  12/22/2023 WBC WNL, Tmax 100.9  SCr down to 0.96, CrCl ~ 40 ml/min Off peripheral levo this morning  Plan: Increase Cefepime  to 2g IV q12h Vancomycin  discontinued Continues on azithromycin  500 mg IV qdqay  Height: 5' 4 (162.6 cm) Weight: 76.4 kg (168 lb 6.9 oz) IBW/kg (Calculated) : 54.7  Temp (24hrs), Avg:98.5 F (36.9 C), Min:97.4 F (36.3 C), Max:100.9 F (38.3 C)  Recent Labs  Lab 12/21/23 0900 12/21/23 0912 12/21/23 1140 12/21/23 1639 12/21/23 1832 12/22/23 0338  WBC 9.9  --   --  8.0  --  7.5  CREATININE 1.30*  --   --  1.07*  --  0.96  LATICACIDVEN  --  2.7* 2.1* 1.0 1.0  --     Estimated Creatinine Clearance: 39.8 mL/min (by C-G formula based on SCr of 0.96 mg/dL).    Allergies  Allergen Reactions   Adhesive [Tape] Rash   Celebrex [Celecoxib] Hives   Ciprofibrate Nausea Only   Cymbalta [Duloxetine Hcl] Swelling   Gabitril [Tiagabine] Swelling   Lyrica [Pregabalin] Swelling   Neurontin [Gabapentin] Swelling   Nexium  [Esomeprazole ] Rash   Nsaids Rash   Shrimp [Shellfish Allergy] Anaphylaxis    Per patient shrimp only   Azactam  [Aztreonam ]     Hand swelling    Azelastine  Hcl     Rash    Ciprofloxacin  Other (See Comments)    dizziness   Claritin  [Loratadine ]     Irritability Nervousness    Methotrexate  Derivatives    Nasacort  [Triamcinolone ]     Dizzy    Olopatadine  Other (See Comments)    Pain and lethargy    Other    Sulfamethizole Other (See Comments)    unknown   Zantac [Ranitidine Hcl] Other (See Comments)    unknown   Claritin -D 12 Hour [Loratadine -Pseudoephedrine Er] Anxiety   Keflex  [Cephalexin ] Nausea And Vomiting    Tolerated Ancef    Penicillins Rash    Injection site reaction. Tolerated cefepime  in past. Also reports tolerating a penicillin infusion after this  initial rxn ~20 yrs ago.  Has patient had a PCN reaction causing immediate rash, facial/tongue/throat swelling, SOB or lightheadedness with hypotension: No Has patient had a PCN reaction causing severe rash involving mucus membranes or skin necrosis: No Has patient had a PCN reaction that required hospitalization No Has patient had a PCN reaction occurring within the last 10 years: No     Sulfa Antibiotics Nausea And Vomiting    Antimicrobials this admission: 1/10 metronidazole  x 1  1/10 Cefepime  >>  1/10 Azith >> 1/10 Vanc >> 1/11 Dose adjustments this admission:  Microbiology results: 1/10 BCx2: ngtd 1/10 Resp panel: neg covid, flu, rsv 1/10 Resp panel:neg 1/10 MRSA neg   Thank you for allowing pharmacy to be a part of this patient's care.  Eileen Wilkerson, Pharm.D Use secure chat for questions 12/22/2023 9:23 AM

## 2023-12-22 NOTE — Plan of Care (Signed)

## 2023-12-22 NOTE — Progress Notes (Addendum)
 NAME:  Eileen Wilkerson, MRN:  [id number], DOB:  1932/04/11, LOS: 1 ADMISSION DATE:  12/21/2023, CONSULTATION DATE:  1/10 REFERRING MD:  Elnor, CHIEF COMPLAINT:  shock    History of Present Illness:  89 year old female w/ extensive medical hx as outlined below but most notably: RA on chronic steroids, adrenal insuff, HFpEF, CCD stage *, significant difficulty w/ ADLs at baseline. [facility]ides at [facility].  Seen by NP at [facility] on 1/9 w/ cc: N/V/D w/ associated abd pain. At time afebrile. No sick contacts. Felt to be gastritis. Provided w/ script for zofran , instructed to hold her diuretic and labs ordered.  Presented to ER 1/10 confused, febrile temp 103.3  HR 115, while in the ER got hypotensive SBP 80s, received 2 liters crystalloid, blood cultures sent, started on empiric abx and ultimately norepi to maintain MAP so Critical care asked to admit In ER initial dx eval  COVID-negative, influenza negative Serum creatinine 1.3 baseline around 1-1.1 white blood cell count 9.9 hemoglobin 13.7 INR 1.3 lactate 2.7, cleared to 2.1 post volume resuscitation urinalysis positive for protein.  CT obtained negative for pulmonary emboli, multiple solid noncalcified lung nodules, no acute inflammatory process in the abdomen or pelvis there is a 3.5 x 4.3 hypoattenuating structure in the left adnexa felt ovarian in etiology, also a tiny soft umbilical hernia Pertinent  Medical History  RA on chronic steroids involving multple joints (previously on Enbrel, Plaqunil, Arava , methotrexate , GERD, Fibromyalgia, HTN, asthma, adrenal insuff/adrenal failure,  prior encephalopathy, DVT, steroid induced hyperglycemia, HFpEF, Gout, UTI, e coli bacteremia, actinic keratosis, perf sig colon, physical deconditioning, DDD, OA  Significant Hospital Events: Including procedures, antibiotic start and stop dates in addition to other pertinent events   1/10 admitted with septic shock, etiology not clear pancultured,  broad-spectrum antibiotic started, started on stress dose steroids 1/10 RVP negative   Interim History / Subjective:  Norepinephrine , weaned off on 1/11 I/O+ 3.2 L total Lactic acid normalized   Objective   Blood pressure (!) 111/44, pulse (!) 55, temperature (!) 97.4 F (36.3 C), temperature source Axillary, resp. rate 17, height 5' 4 (1.626 m), weight 76.4 kg, SpO2 100%.        Intake/Output Summary (Last 24 hours) at 12/22/2023 0726 Last data filed at 12/22/2023 0300 Gross per 24 hour  Intake 3518.13 ml  Output 250 ml  Net 3268.13 ml   Filed Weights   12/21/23 0833 12/21/23 2000 12/22/23 0500  Weight: 73.5 kg 76.4 kg 76.4 kg    Examination: General: Elderly woman awake and interacting HENT: Oropharynx is dry, strong voice, no stridor, no secretions Lungs: Good air movement, no wheezes or crackles g Cardiac regular, distant, no murmur Abdomen: Nondistended, mild generalized tenderness, positive bowel sounds Extremities: Trace pretibial edema Neuro: Awake and alert, oriented x 3, answers questions, follows commands with good strength  Resolved Hospital Problem list    Assessment & Plan:  Septic shock with lactic acidosis.  Source currently not clear.  Certainly possible she has some sort of viral gastroenteritis complicated by underlying adrenal insufficiency Plan -Wean pressors to off -Continue gentle volume resuscitation until she can take adequate p.o. -Follow culture data and tailor antibiotics based on results -Continue broad-spectrum antibiotics, stop vancomycin  on 1/11 -Consider transition hydrocortisone  to home prednisone  5 mg daily on 1/11 remains hemodynamically stable -Holding home amlodipine , torsemide  History of adrenal insufficiency.  On chronic steroids, prior history of acute on chronic renal insufficiency as well Plan -Currently on hydrocortisone   100 mg every 12 hours.  Consider transition back to her prednisone  5 mg daily on 1/11 as she is  hemodynamically improved  Acute metabolic encephalopathy secondary to sepsis Plan -Continue supportive care -Minimize sedation  Acute renal failure on chronic renal failure CKD Stage 3b - GFR 30 to 44 (Moderately to severely decreased).  Improved Plan -Restore adequate renal perfusion -Continue to hold torsemide for another day -Renal dose medications -Follow BMP, urine output  Hypokalemia, improved Plan -Follow BMP, replete as indicated  Nausea and vomiting.  CT negative for acute inflammatory process.  Lipase reassuring Plan -Antiemetics as indicated -Will try clear diet on 1/11 and advance as able  Ovarian mass, as well as multiple solid noncalcified lung nodules Worrisome for malignancy Plan -Apparently a new finding.  Will need to discuss possible diagnostics once acute issues have stabilized  History of rheumatoid arthritis with significant deconditioning as an impact on ADLs Plan -Goal back to prednisone  5 mg once daily soon, once we ensure hemodynamic stability   Best Practice (right click and Reselect all SmartList Selections daily)   Diet/type: NPO w/ oral meds we will try clears 1/11 DVT prophylaxis prophylactic heparin   Pressure ulcer(s): N/A GI prophylaxis: N/A Lines: N/A Foley:  N/A Code Status:  full code Last date of multidisciplinary goals of care discussion [pending]  Labs   CBC: Recent Labs  Lab 12/21/23 0900 12/21/23 1639 12/22/23 0338  WBC 9.9 8.0 7.5  NEUTROABS 6.8  --   --   HGB 13.7 11.3* 10.6*  HCT 44.7 34.8* 34.1*  MCV 94.1 91.6 93.9  PLT 226 190 169    Basic Metabolic Panel: Recent Labs  Lab 12/21/23 0900 12/21/23 1639 12/22/23 0338  NA 141  --  143  K 3.2*  --  3.8  CL 107  --  111  CO2 25  --  23  GLUCOSE 107*  --  117*  BUN 17  --  15  CREATININE 1.30* 1.07* 0.96  CALCIUM  8.8*  --  8.2*  MG  --  1.6*  --   PHOS  --  3.6  --    GFR: Estimated Creatinine Clearance: 39.8 mL/min (by C-G formula based on SCr of  0.96 mg/dL). Recent Labs  Lab 12/21/23 0900 12/21/23 0912 12/21/23 1140 12/21/23 1639 12/21/23 1832 12/22/23 0338  PROCALCITON  --   --   --  4.28  --   --   WBC 9.9  --   --  8.0  --  7.5  LATICACIDVEN  --  2.7* 2.1* 1.0 1.0  --     Liver Function Tests: Recent Labs  Lab 12/21/23 0900 12/22/23 0338  AST 50* 45*  ALT 28 26  ALKPHOS 75 57  BILITOT 0.8 0.6  PROT 7.0 5.2*  ALBUMIN  3.3* 2.5*   Recent Labs  Lab 12/21/23 1639  LIPASE 20   Recent Labs  Lab 12/21/23 1130  AMMONIA 23    ABG    Component Value Date/Time   PHART 7.4 12/21/2023 1600   PCO2ART 44 12/21/2023 1600   PO2ART 119 (H) 12/21/2023 1600   HCO3 27.3 12/21/2023 1600   ACIDBASEDEF 1.5 05/21/2022 1606   O2SAT 100 12/21/2023 1600     Coagulation Profile: Recent Labs  Lab 12/21/23 0900  INR 1.3*    Cardiac Enzymes: No results for input(s): CKTOTAL, CKMB, CKMBINDEX, TROPONINI in the last 168 hours.  HbA1C: Hemoglobin A1C  Date/Time Value Ref Range Status  11/28/2023 12:00 AM 6.5  Final  Hgb A1c MFr Bld  Date/Time Value Ref Range Status  02/15/2023 03:25 PM 6.7 (H) 4.8 - 5.6 % Final    Comment:    (NOTE)         Prediabetes: 5.7 - 6.4         Diabetes: >6.4         Glycemic control for adults with diabetes: <7.0     CBG: No results for input(s): GLUCAP in the last 168 hours.    Critical care time:  NA      Lamar Chris, MD, PhD 12/22/2023, 7:26 AM  Pulmonary and Critical Care [phone] or if no answer before 7:00PM call [phone] For any issues after 7:00PM please call eLink [phone]

## 2023-12-22 NOTE — Plan of Care (Signed)
  Problem: Clinical Measurements: Goal: Diagnostic test results will improve Outcome: Progressing Goal: Respiratory complications will improve Outcome: Progressing Goal: Cardiovascular complication will be avoided Outcome: Progressing   Problem: Pain Management: Goal: General experience of comfort will improve Outcome: Progressing   Problem: Skin Integrity: Goal: Risk for impaired skin integrity will decrease Outcome: Progressing

## 2023-12-23 ENCOUNTER — Telehealth: Payer: Self-pay | Admitting: Emergency Medicine

## 2023-12-23 DIAGNOSIS — A419 Sepsis, unspecified organism: Secondary | ICD-10-CM | POA: Diagnosis not present

## 2023-12-23 DIAGNOSIS — R6521 Severe sepsis with septic shock: Secondary | ICD-10-CM

## 2023-12-23 LAB — BLOOD GAS, ARTERIAL
Drawn by: 20012
FIO2: 36 %
O2 Saturation: 100 %
O2 Saturation: 100 mmol/L (ref 20.0–28.0)
Patient temperature: 20012
Patient temperature: 37.2 mmol/L — ABNORMAL HIGH (ref 0.0–2.0)
pCO2 arterial: 44 mm[Hg] (ref 32–48)
pCO2 arterial: 44 mmol/L (ref 32–48)
pH, Arterial: 7.4 (ref 7.35–7.45)
pO2, Arterial: 119 mmHg — ABNORMAL HIGH (ref 83–7.45)
pO2, Arterial: 119 mmol/L — ABNORMAL HIGH (ref 83–2.0)

## 2023-12-23 MED ORDER — AZITHROMYCIN 250 MG PO TABS
500.0000 mg | ORAL_TABLET | Freq: Every day | ORAL | Status: AC
  Administered 2023-12-23 – 2023-12-25 (×3): 500 mg via ORAL
  Filled 2023-12-23 (×3): qty 2

## 2023-12-23 MED ORDER — PREDNISONE 5 MG PO TABS
5.0000 mg | ORAL_TABLET | Freq: Every day | ORAL | Status: DC
  Administered 2023-12-24 – 2023-12-26 (×3): 5 mg via ORAL
  Filled 2023-12-23 (×3): qty 1

## 2023-12-23 NOTE — Progress Notes (Addendum)
 PROGRESS NOTE    Eileen Wilkerson  FMW:[id number] DOB: 1932/07/30 DOA: 12/21/2023 PCP: Mast, [name], NP   Brief Narrative:  89 year old female with history of rheumatoid arthritis on chronic steroids, chronic adrenal insufficiency, chronic diastolic heart failure, CKD stage IIIa, fibromyalgia, hypertension, asthma and other comorbidities presented with fever, confusion and was found to be hypotensive, tachycardic in the ED.  She was started on IV fluids, blood pressure remained low and she was started on IV pressors along with broad-spectrum antibiotics and fluids.SABRA  COVID/influenza/RSV PCR negative.  Lactate was 2.7 and 2.1.  CT was negative for pulmonary embolism but showed multiple solid noncalcified lung nodules, no acute inflammatory process in the abdomen or pelvis but showed possible ovarian cyst.  She was transferred to [facility] service from 12/23/2023 onwards.  Assessment & Plan:   Septic shock: Present on admission, source currently unclear Lactic acidosis -Presented in septic shock and was admitted to ICU under PCCM service requiring broad-spectrum antibiotics, IV fluids and pressors.  Currently off of pressors.  Blood pressure creeping upwards.  COVID/influenza/RSV PCR negative.  Respiratory panel PCR negative.  Blood cultures have been negative so far. -Currently on broad-spectrum antibiotics.  Vancomycin  has been discontinued. -No clear-cut source of septic shock identified: Might have had some sort of viral gastroenteritis complicated by underlying adrenal insufficiency.  No diarrhea since admission.  DC stool testing and isolation  History of adrenal insufficiency History of rheumatoid arthritis -Currently on IV hydrocortisone .  Switch back to prednisone  5 mg daily from today onwards.  Outpatient follow-up with endocrinology and rheumatology  Acute metabolic encephalopathy -Possibly from above.  Mental status has much improved.  Fall precautions.  PT eval.  AKI on CKD stage  IIIa -Creatinine has normalized with IV fluids. -Monitor  Hypertension -Blood pressure improving.  Antihypertensives remain on hold for now.  Hypokalemia -Improved  Nausea and vomiting -CT abdomen negative for acute inflammatory process.  Improved.  Advance diet as tolerated.  Antiemetics as needed.  Hypomagnesemia -No labs today.  Obesity -Outpatient follow-up  Ovarian mass as well as multiple solid noncalcified lung nodules -Imaging suspicious of possible ovarian cyst: Recommended outpatient follow-up with annual ultrasound evaluation.  Patient will need outpatient follow-up with pulmonary regarding follow-up of lung nodules, worrisome for malignancy  Goals of care -Patient is currently listed as full code.  Consult palliative care for goals of care discussion.   DVT prophylaxis: Heparin  subcutaneous Code Status: Full Family Communication: None at bedside Disposition Plan: Status is: Inpatient Remains inpatient appropriate because: Of severity of illness    Consultants: PCCM  Procedures: None  Antimicrobials:  Anti-infectives (From admission, onward)    Start     Dose/Rate Route Frequency Ordered Stop   12/23/23 1400  azithromycin  (ZITHROMAX ) tablet 500 mg        500 mg Oral Daily 12/23/23 0803 12/26/23 1359   12/23/23 1000  vancomycin  (VANCOREADY) IVPB 1250 mg/250 mL  Status:  Discontinued        1,250 mg 166.7 mL/hr over 90 Minutes Intravenous Every 48 hours 12/21/23 1523 12/22/23 0926   12/22/23 2000  ceFEPIme  (MAXIPIME ) 2 g in sodium chloride  0.9 % 100 mL IVPB        2 g 200 mL/hr over 30 Minutes Intravenous Every 12 hours 12/22/23 0920     12/22/23 1000  ceFEPIme  (MAXIPIME ) 2 g in sodium chloride  0.9 % 100 mL IVPB  Status:  Discontinued        2 g 200 mL/hr over 30 Minutes Intravenous  Every 24 hours 12/21/23 1523 12/22/23 0920   12/21/23 1615  vancomycin  (VANCOCIN ) 500 mg in sodium chloride  0.9 % 100 mL IVPB        500 mg 100 mL/hr over 60 Minutes  Intravenous  Once 12/21/23 1608 12/21/23 1712   12/21/23 1530  vancomycin  (VANCOREADY) IVPB 500 mg/100 mL  Status:  Discontinued        500 mg 100 mL/hr over 60 Minutes Intravenous  Once 12/21/23 1518 12/21/23 1608   12/21/23 1500  azithromycin  (ZITHROMAX ) 500 mg in sodium chloride  0.9 % 250 mL IVPB  Status:  Discontinued        500 mg 250 mL/hr over 60 Minutes Intravenous Every 24 hours 12/21/23 1414 12/23/23 0803   12/21/23 1045  vancomycin  (VANCOCIN ) IVPB 1000 mg/200 mL premix        1,000 mg 200 mL/hr over 60 Minutes Intravenous  Once 12/21/23 1032 12/21/23 2051   12/21/23 0845  ceFEPIme  (MAXIPIME ) 2 g in sodium chloride  0.9 % 100 mL IVPB        2 g 200 mL/hr over 30 Minutes Intravenous  Once 12/21/23 0836 12/21/23 0957   12/21/23 0845  metroNIDAZOLE  (FLAGYL ) IVPB 500 mg        500 mg 100 mL/hr over 60 Minutes Intravenous  Once 12/21/23 0836 12/21/23 1025        Subjective: Patient seen and examined at bedside.  Feels slightly better.  Still feels weak.  Appetite improving.  No fever, vomiting, chest pain reported.  Objective: Vitals:   12/22/23 1500 12/22/23 2151 12/23/23 0012 12/23/23 0432  BP: 112/62 (!) 121/54 (!) 111/50 (!) 142/66  Pulse: 64 (!) 56 (!) 58 (!) 59  Resp:      Temp: 98.2 F (36.8 C) 97.6 F (36.4 C) (!) 97.3 F (36.3 C) (!) 97.5 F (36.4 C)  TempSrc:  Oral Oral Oral  SpO2: 95% 95% 95% 95%  Weight:    79.3 kg  Height:        Intake/Output Summary (Last 24 hours) at 12/23/2023 0748 Last data filed at 12/23/2023 0537 Gross per 24 hour  Intake 1848.99 ml  Output 850 ml  Net 998.99 ml   Filed Weights   12/21/23 2000 12/22/23 0500 12/23/23 0432  Weight: 76.4 kg 76.4 kg 79.3 kg    Examination:  General exam: Appears calm and comfortable.  Elderly female lying in bed.  On room air. Respiratory system: Bilateral decreased breath sounds at bases with some scattered crackles Cardiovascular system: S1 & S2 heard, mild intermittent bradycardia present   gastrointestinal system: Abdomen is obese, nondistended, soft and nontender. Normal bowel sounds heard. Extremities: No cyanosis, clubbing, edema  Central nervous system: Alert and oriented.  Slow to respond.  No focal neurological deficits. Moving extremities Skin: No rashes, lesions or ulcers Psychiatry: Flat affect.  Not agitated   Data Reviewed: I have personally reviewed following labs and imaging studies  CBC: Recent Labs  Lab 12/21/23 0900 12/21/23 1639 12/22/23 0338  WBC 9.9 8.0 7.5  NEUTROABS 6.8  --   --   HGB 13.7 11.3* 10.6*  HCT 44.7 34.8* 34.1*  MCV 94.1 91.6 93.9  PLT 226 190 169   Basic Metabolic Panel: Recent Labs  Lab 12/21/23 0900 12/21/23 1639 12/22/23 0338  NA 141  --  143  K 3.2*  --  3.8  CL 107  --  111  CO2 25  --  23  GLUCOSE 107*  --  117*  BUN 17  --  15  CREATININE 1.30* 1.07* 0.96  CALCIUM  8.8*  --  8.2*  MG  --  1.6*  --   PHOS  --  3.6  --    GFR: Estimated Creatinine Clearance: 40.5 mL/min (by C-G formula based on SCr of 0.96 mg/dL). Liver Function Tests: Recent Labs  Lab 12/21/23 0900 12/22/23 0338  AST 50* 45*  ALT 28 26  ALKPHOS 75 57  BILITOT 0.8 0.6  PROT 7.0 5.2*  ALBUMIN  3.3* 2.5*   Recent Labs  Lab 12/21/23 1639  LIPASE 20   Recent Labs  Lab 12/21/23 1130  AMMONIA 23   Coagulation Profile: Recent Labs  Lab 12/21/23 0900  INR 1.3*   Cardiac Enzymes: No results for input(s): CKTOTAL, CKMB, CKMBINDEX, TROPONINI in the last 168 hours. BNP (last 3 results) No results for input(s): PROBNP in the last 8760 hours. HbA1C: No results for input(s): HGBA1C in the last 72 hours. CBG: No results for input(s): GLUCAP in the last 168 hours. Lipid Profile: No results for input(s): CHOL, HDL, LDLCALC, TRIG, CHOLHDL, LDLDIRECT in the last 72 hours. Thyroid  Function Tests: No results for input(s): TSH, T4TOTAL, FREET4, T3FREE, THYROIDAB in the last 72 hours. Anemia Panel: No  results for input(s): VITAMINB12, FOLATE, FERRITIN, TIBC, IRON, RETICCTPCT in the last 72 hours. Sepsis Labs: Recent Labs  Lab 12/21/23 0912 12/21/23 1140 12/21/23 1639 12/21/23 1832  PROCALCITON  --   --  4.28  --   LATICACIDVEN 2.7* 2.1* 1.0 1.0    Recent Results (from the past 240 hours)  Resp panel by RT-PCR (RSV, Flu A&B, Covid) Anterior Nasal Swab     Status: None   Collection Time: 12/21/23  8:35 AM   Specimen: Anterior Nasal Swab  Result Value Ref Range Status   SARS Coronavirus 2 by RT PCR NEGATIVE NEGATIVE Final    Comment: (NOTE) SARS-CoV-2 target nucleic acids are NOT DETECTED.  The SARS-CoV-2 RNA is generally detectable in upper respiratory specimens during the acute phase of infection. The lowest concentration of SARS-CoV-2 viral copies this assay can detect is 138 copies/mL. A negative result does not preclude SARS-Cov-2 infection and should not be used as the sole basis for treatment or other patient management decisions. A negative result may occur with  improper specimen collection/handling, submission of specimen other than nasopharyngeal swab, presence of viral mutation(s) within the areas targeted by this assay, and inadequate number of viral copies(<138 copies/mL). A negative result must be combined with clinical observations, patient history, and epidemiological information. The expected result is Negative.  Fact Sheet for Patients:  bloggercourse.com  Fact Sheet for Healthcare Providers:  seriousbroker.it  This test is no t yet approved or cleared by the [place]  FDA and  has been authorized for detection and/or diagnosis of SARS-CoV-2 by FDA under an Emergency Use Authorization (EUA). This EUA will remain  in effect (meaning this test can be used) for the duration of the COVID-19 declaration under Section 564(b)(1) of the Act, 21 U.S.C.section 360bbb-3(b)(1), unless the  authorization is terminated  or revoked sooner.       Influenza A by PCR NEGATIVE NEGATIVE Final   Influenza B by PCR NEGATIVE NEGATIVE Final    Comment: (NOTE) The Xpert Xpress SARS-CoV-2/FLU/RSV plus assay is intended as an aid in the diagnosis of influenza from Nasopharyngeal swab specimens and should not be used as a sole basis for treatment. Nasal washings and aspirates are unacceptable for Xpert Xpress SARS-CoV-2/FLU/RSV testing.  Fact Sheet for Patients: bloggercourse.com  Fact  Sheet for Healthcare Providers: seriousbroker.it  This test is not yet approved or cleared by the [place]  FDA and has been authorized for detection and/or diagnosis of SARS-CoV-2 by FDA under an Emergency Use Authorization (EUA). This EUA will remain in effect (meaning this test can be used) for the duration of the COVID-19 declaration under Section 564(b)(1) of the Act, 21 U.S.C. section 360bbb-3(b)(1), unless the authorization is terminated or revoked.     Resp Syncytial Virus by PCR NEGATIVE NEGATIVE Final    Comment: (NOTE) Fact Sheet for Patients: bloggercourse.com  Fact Sheet for Healthcare Providers: seriousbroker.it  This test is not yet approved or cleared by the [place]  FDA and has been authorized for detection and/or diagnosis of SARS-CoV-2 by FDA under an Emergency Use Authorization (EUA). This EUA will remain in effect (meaning this test can be used) for the duration of the COVID-19 declaration under Section 564(b)(1) of the Act, 21 U.S.C. section 360bbb-3(b)(1), unless the authorization is terminated or revoked.  Performed at [facility], [address], [place], [place] [address]   Blood Culture (routine x 2)     Status: None (Preliminary result)   Collection Time: 12/21/23  9:00 AM   Specimen: BLOOD  Result Value Ref Range Status   Specimen  Description   Final    BLOOD RIGHT ANTECUBITAL Performed at [facility], [address], [place], [place] [address]    Special Requests   Final    BOTTLES DRAWN AEROBIC AND ANAEROBIC Blood Culture results may not be optimal due to an inadequate volume of blood received in culture bottles Performed at [facility], [address], [place], [place] [address]    Culture   Final    NO GROWTH 2 DAYS Performed at [facility], [address], [place], [place] [address]    Report Status PENDING  Incomplete  Blood Culture (routine x 2)     Status: None (Preliminary result)   Collection Time: 12/21/23  9:50 AM   Specimen: BLOOD RIGHT WRIST  Result Value Ref Range Status   Specimen Description   Final    BLOOD RIGHT WRIST Performed at [facility], [address] [place], [place] [address]    Special Requests   Final    BOTTLES DRAWN AEROBIC AND ANAEROBIC Blood Culture results may not be optimal due to an inadequate volume of blood received in culture bottles Performed at [facility], [address] [place], [place] [address]    Culture   Final    NO GROWTH 2 DAYS Performed at [facility], [address], [place], [place] [address]    Report Status PENDING  Incomplete  Respiratory (~20 pathogens) panel by PCR     Status: None   Collection Time: 12/21/23  4:48 PM   Specimen: Nasal Mucosa; Respiratory  Result Value Ref Range Status   Adenovirus NOT DETECTED NOT DETECTED Final   Coronavirus 229E NOT DETECTED NOT DETECTED Final    Comment: (NOTE) The Coronavirus on the Respiratory Panel, DOES NOT test for the novel  Coronavirus (2019 nCoV)    Coronavirus HKU1 NOT DETECTED NOT DETECTED Final   Coronavirus NL63 NOT DETECTED NOT DETECTED Final   Coronavirus OC43 NOT DETECTED NOT DETECTED Final   Metapneumovirus NOT DETECTED NOT DETECTED Final   Rhinovirus / Enterovirus NOT DETECTED NOT DETECTED Final    Influenza A NOT DETECTED NOT DETECTED Final   Influenza B NOT  DETECTED NOT DETECTED Final   Parainfluenza Virus 1 NOT DETECTED NOT DETECTED Final   Parainfluenza Virus 2 NOT DETECTED NOT DETECTED Final   Parainfluenza Virus 3 NOT DETECTED NOT DETECTED Final   Parainfluenza Virus 4 NOT DETECTED NOT DETECTED Final   Respiratory Syncytial Virus NOT DETECTED NOT DETECTED Final   Bordetella pertussis NOT DETECTED NOT DETECTED Final   Bordetella Parapertussis NOT DETECTED NOT DETECTED Final   Chlamydophila pneumoniae NOT DETECTED NOT DETECTED Final   Mycoplasma pneumoniae NOT DETECTED NOT DETECTED Final    Comment: Performed at [facility], [address], [place], [place] [address]  MRSA Next Gen by PCR, Nasal     Status: None   Collection Time: 12/21/23  4:48 PM   Specimen: Nasal Mucosa; Nasal Swab  Result Value Ref Range Status   MRSA by PCR Next Gen NOT DETECTED NOT DETECTED Final    Comment: (NOTE) The GeneXpert MRSA Assay (FDA approved for NASAL specimens only), is one component of a comprehensive MRSA colonization surveillance program. It is not intended to diagnose MRSA infection nor to guide or monitor treatment for MRSA infections. Test performance is not FDA approved in patients less than 56 years old. Performed at [facility], [address], [place], [place] [address]          Radiology Studies: ECHOCARDIOGRAM COMPLETE Result Date: 12/21/2023    ECHOCARDIOGRAM REPORT   Patient Name:   AMARIYAH BAZAR Date of Exam: 12/21/2023 Medical Rec #:  [id number]          Height:       64.0 in Accession #:    [id number]         Weight:       162.0 lb Date of Birth:  19-Nov-1932          BSA:          1.789 m Patient Age:    89 years           BP:           97/58 mmHg Patient Gender: F                  HR:           76 bpm. Exam Location:  Inpatient Procedure: 2D Echo, Cardiac Doppler and Color Doppler STAT ECHO Indications:    Shock  History:        Patient has  prior history of Echocardiogram examinations, most                 recent 05/22/2022. CHF, Arrythmias:Tachycardia,                 Signs/Symptoms:Shortness of Breath; Risk Factors:Dyslipidemia                 and Hypertension.  Sonographer:    Juanita Shaw Referring Phys: [id number] JONATHAN B DEWALD IMPRESSIONS  1. Left ventricular ejection fraction, by estimation, is 55 to 60%. The left ventricle has normal function. The left ventricle has no regional wall motion abnormalities. Left ventricular diastolic parameters are consistent with Grade I diastolic dysfunction (impaired relaxation).  2. Right ventricular systolic function is normal. The right ventricular size is normal. Tricuspid regurgitation signal is inadequate for assessing PA pressure.  3. The mitral valve is normal in structure. No evidence of mitral valve regurgitation. No evidence of mitral stenosis.  4. The aortic valve is tricuspid. There is mild calcification of the aortic valve. Aortic valve  regurgitation is not visualized. No aortic stenosis is present.  5. The inferior vena cava is normal in size with greater than 50% respiratory variability, suggesting right atrial pressure of 3 mmHg. FINDINGS  Left Ventricle: Left ventricular ejection fraction, by estimation, is 55 to 60%. The left ventricle has normal function. The left ventricle has no regional wall motion abnormalities. The left ventricular internal cavity size was normal in size. There is  no left ventricular hypertrophy. Left ventricular diastolic parameters are consistent with Grade I diastolic dysfunction (impaired relaxation). Right Ventricle: The right ventricular size is normal. No increase in right ventricular wall thickness. Right ventricular systolic function is normal. Tricuspid regurgitation signal is inadequate for assessing PA pressure. Left Atrium: Left atrial size was normal in size. Right Atrium: Right atrial size was normal in size. Pericardium: There is no evidence of  pericardial effusion. Mitral Valve: The mitral valve is normal in structure. No evidence of mitral valve regurgitation. No evidence of mitral valve stenosis. MV peak gradient, 2.5 mmHg. The mean mitral valve gradient is 1.0 mmHg. Tricuspid Valve: The tricuspid valve is normal in structure. Tricuspid valve regurgitation is not demonstrated. Aortic Valve: The aortic valve is tricuspid. There is mild calcification of the aortic valve. Aortic valve regurgitation is not visualized. No aortic stenosis is present. Aortic valve mean gradient measures 6.0 mmHg. Aortic valve peak gradient measures 11.4 mmHg. Aortic valve area, by VTI measures 2.06 cm. Pulmonic Valve: The pulmonic valve was normal in structure. Pulmonic valve regurgitation is not visualized. Aorta: The aortic root is normal in size and structure. Venous: The inferior vena cava is normal in size with greater than 50% respiratory variability, suggesting right atrial pressure of 3 mmHg. IAS/Shunts: No atrial level shunt detected by color flow Doppler.  LEFT VENTRICLE PLAX 2D LVIDd:         3.90 cm      Diastology LVIDs:         3.00 cm      LV e' medial:    6.85 cm/s LV PW:         0.70 cm      LV E/e' medial:  9.8 LV IVS:        0.80 cm      LV e' lateral:   8.05 cm/s LVOT diam:     1.80 cm      LV E/e' lateral: 8.3 LV SV:         64 LV SV Index:   36 LVOT Area:     2.54 cm  LV Volumes (MOD) LV vol d, MOD A2C: 104.0 ml LV vol d, MOD A4C: 112.0 ml LV vol s, MOD A2C: 34.3 ml LV vol s, MOD A4C: 50.3 ml LV SV MOD A2C:     69.7 ml LV SV MOD A4C:     112.0 ml LV SV MOD BP:      67.4 ml RIGHT VENTRICLE             IVC RV Basal diam:  3.80 cm     IVC diam: 1.70 cm RV Mid diam:    3.70 cm RV S prime:     13.50 cm/s TAPSE (M-mode): 2.6 cm LEFT ATRIUM             Index        RIGHT ATRIUM          Index LA diam:        3.10 cm 1.73 cm/m   RA Area:  9.11 cm LA Vol (A2C):   22.4 ml 12.52 ml/m  RA Volume:   17.40 ml 9.73 ml/m LA Vol (A4C):   25.1 ml 14.03 ml/m LA  Biplane Vol: 25.9 ml 14.48 ml/m  AORTIC VALVE                     PULMONIC VALVE AV Area (Vmax):    2.02 cm      PV Vmax:       0.78 m/s AV Area (Vmean):   1.51 cm      PV Peak grad:  2.4 mmHg AV Area (VTI):     2.06 cm AV Vmax:           169.00 cm/s AV Vmean:          119.000 cm/s AV VTI:            0.309 m AV Peak Grad:      11.4 mmHg AV Mean Grad:      6.0 mmHg LVOT Vmax:         134.00 cm/s LVOT Vmean:        70.800 cm/s LVOT VTI:          0.250 m LVOT/AV VTI ratio: 0.81  AORTA Ao Root diam: 2.80 cm Ao Asc diam:  2.90 cm MITRAL VALVE MV Area (PHT): 3.12 cm    SHUNTS MV Area VTI:   2.37 cm    Systemic VTI:  0.25 m MV Peak grad:  2.5 mmHg    Systemic Diam: 1.80 cm MV Mean grad:  1.0 mmHg MV Vmax:       0.79 m/s MV Vmean:      51.8 cm/s MV Decel Time: 243 msec MV E velocity: 67.00 cm/s MV A velocity: 62.90 cm/s MV E/A ratio:  1.07 Dalton McleanMD Electronically signed by Ezra Kanner Signature Date/Time: 12/21/2023/3:10:49 PM    Final    DG Hip Unilat W or Wo Pelvis 2-3 Views Left Result Date: 12/21/2023 CLINICAL DATA:  Left hip pain. EXAM: DG HIP (WITH OR WITHOUT PELVIS) 2-3V LEFT COMPARISON:  None Available. FINDINGS: There is no acute fracture or dislocation.Evaluation for fracture is limited due to osteopenia and body habitus. There is moderate left and severe right hip osteoarthritic changes. There is near complete loss of right hip joint space with near bone on bone contact. Excreted contrast noted in the urinary bladder. The soft tissues are unremarkable. IMPRESSION: 1. No acute fracture or dislocation. 2. Moderate left and severe right hip osteoarthritis. Electronically Signed   By: Vanetta Chou M.D.   On: 12/21/2023 12:38   CT Angio Chest PE W and/or Wo Contrast Result Date: 12/21/2023 CLINICAL DATA:  Pulmonary embolism (PE) suspected, high prob; Epigastric pain sepsis. Nausea and lethargy. EXAM: CT ANGIOGRAPHY CHEST CT ABDOMEN AND PELVIS WITH CONTRAST TECHNIQUE: Multidetector CT imaging  of the chest was performed using the standard protocol during bolus administration of intravenous contrast. Multiplanar CT image reconstructions and MIPs were obtained to evaluate the vascular anatomy. Multidetector CT imaging of the abdomen and pelvis was performed using the standard protocol during bolus administration of intravenous contrast. RADIATION DOSE REDUCTION: This exam was performed according to the departmental dose-optimization program which includes automated exposure control, adjustment of the mA and/or kV according to patient size and/or use of iterative reconstruction technique. CONTRAST:  75mL OMNIPAQUE  IOHEXOL  350 MG/ML SOLN COMPARISON:  CT scan abdomen and pelvis from 02/12/2023. FINDINGS: CTA CHEST FINDINGS Cardiovascular: No evidence of embolism to the proximal  subsegmental pulmonary artery level. Normal cardiac size. No pericardial effusion. No aortic aneurysm. There are coronary artery calcifications, in keeping with coronary artery disease. Mediastinum/Nodes: Visualized thyroid  gland appears grossly unremarkable. No solid / cystic mediastinal masses. The esophagus is nondistended precluding optimal assessment. There are few mildly prominent mediastinal and hilar lymph nodes, which do not meet the size criteria for lymphadenopathy. No axillary lymphadenopathy by size criteria. Lungs/Pleura: The central tracheo-bronchial tree is patent. There is mild, smooth, circumferential thickening of the segmental and subsegmental bronchial walls, throughout bilateral lungs, which is nonspecific. Findings are most commonly seen with bronchitis or reactive airway disease, such as asthma. There are occlusive and nonocclusive filling defects in the distal segmental and subsegmental bronchi, throughout bilateral lungs, likely due to mucus/secretions or aspiration. There are multiple solid noncalcified nodules throughout bilateral lungs with largest in the anterior left upper lobe measuring up to 1.8 x 1.8  cm. This nodule exhibits small central cavitation. Rest of the nodules are smaller in size and do not exhibit cavitations. These are compatible with metastases. No consolidation, pleural effusion or pneumothorax. There are dependent changes in bilateral lungs. Musculoskeletal: The visualized soft tissues of the chest wall are grossly unremarkable. No suspicious osseous lesions. There are mild multilevel degenerative changes in the visualized spine. Review of the MIP images confirms the above findings. CT ABDOMEN and PELVIS FINDINGS Hepatobiliary: The liver is normal in size. Non-cirrhotic configuration. No suspicious mass. No intrahepatic or extrahepatic bile duct dilation. No calcified gallstones. Normal gallbladder wall thickness. No pericholecystic inflammatory changes. Pancreas: Unremarkable. No pancreatic ductal dilatation or surrounding inflammatory changes. Spleen: Within normal limits. No focal lesion. Adrenals/Urinary Tract: Adrenal glands are unremarkable. No suspicious renal mass. There is a 2.3 x 2.4 cm simple cyst in the left kidney interpolar region, anteriorly. There are additional several smaller cysts throughout bilateral kidneys. No hydronephrosis. No renal or ureteric calculi. Unremarkable urinary bladder. Stomach/Bowel: There is a small sliding hiatal hernia. No disproportionate dilation of the small or large bowel loops. No evidence of abnormal bowel wall thickening or inflammatory changes. The appendix is unremarkable. There are scattered diverticula mainly in the sigmoid colon, without imaging signs of diverticulitis. Vascular/Lymphatic: No ascites or pneumoperitoneum. No abdominal or pelvic lymphadenopathy, by size criteria. No aneurysmal dilation of the major abdominal arteries. There are mild peripheral atherosclerotic vascular calcifications of the aorta and its major branches. Reproductive: The uterus is unremarkable. There is a 3.5 x 4.3 cm hypoattenuating structure in the left adnexa,  most likely ovarian in etiology. The structure exhibits fluid attenuation and has no discrete mural nodule. Even though this is incompletely characterized on the current exam, favored to represent a senescent ovarian cyst. Annual follow-up ultrasound evaluation is recommended to documenting stability. Other: There is a tiny fat containing umbilical hernia. The soft tissues and abdominal wall are otherwise unremarkable. Musculoskeletal: No suspicious osseous lesions. There are moderate multilevel degenerative changes in the visualized spine. Review of the MIP images confirms the above findings. IMPRESSION: 1. No embolism to the proximal subsegmental pulmonary artery level. 2. There are multiple solid noncalcified lung nodules, compatible with metastases. 3. No acute inflammatory process identified within the abdomen or pelvis. 4. Multiple other nonacute observations, as described above. Electronically Signed   By: Ree Molt M.D.   On: 12/21/2023 11:18   CT ABDOMEN PELVIS W CONTRAST Result Date: 12/21/2023 CLINICAL DATA:  Pulmonary embolism (PE) suspected, high prob; Epigastric pain sepsis. Nausea and lethargy. EXAM: CT ANGIOGRAPHY CHEST CT ABDOMEN AND PELVIS WITH CONTRAST  TECHNIQUE: Multidetector CT imaging of the chest was performed using the standard protocol during bolus administration of intravenous contrast. Multiplanar CT image reconstructions and MIPs were obtained to evaluate the vascular anatomy. Multidetector CT imaging of the abdomen and pelvis was performed using the standard protocol during bolus administration of intravenous contrast. RADIATION DOSE REDUCTION: This exam was performed according to the departmental dose-optimization program which includes automated exposure control, adjustment of the mA and/or kV according to patient size and/or use of iterative reconstruction technique. CONTRAST:  75mL OMNIPAQUE  IOHEXOL  350 MG/ML SOLN COMPARISON:  CT scan abdomen and pelvis from 02/12/2023.  FINDINGS: CTA CHEST FINDINGS Cardiovascular: No evidence of embolism to the proximal subsegmental pulmonary artery level. Normal cardiac size. No pericardial effusion. No aortic aneurysm. There are coronary artery calcifications, in keeping with coronary artery disease. Mediastinum/Nodes: Visualized thyroid  gland appears grossly unremarkable. No solid / cystic mediastinal masses. The esophagus is nondistended precluding optimal assessment. There are few mildly prominent mediastinal and hilar lymph nodes, which do not meet the size criteria for lymphadenopathy. No axillary lymphadenopathy by size criteria. Lungs/Pleura: The central tracheo-bronchial tree is patent. There is mild, smooth, circumferential thickening of the segmental and subsegmental bronchial walls, throughout bilateral lungs, which is nonspecific. Findings are most commonly seen with bronchitis or reactive airway disease, such as asthma. There are occlusive and nonocclusive filling defects in the distal segmental and subsegmental bronchi, throughout bilateral lungs, likely due to mucus/secretions or aspiration. There are multiple solid noncalcified nodules throughout bilateral lungs with largest in the anterior left upper lobe measuring up to 1.8 x 1.8 cm. This nodule exhibits small central cavitation. Rest of the nodules are smaller in size and do not exhibit cavitations. These are compatible with metastases. No consolidation, pleural effusion or pneumothorax. There are dependent changes in bilateral lungs. Musculoskeletal: The visualized soft tissues of the chest wall are grossly unremarkable. No suspicious osseous lesions. There are mild multilevel degenerative changes in the visualized spine. Review of the MIP images confirms the above findings. CT ABDOMEN and PELVIS FINDINGS Hepatobiliary: The liver is normal in size. Non-cirrhotic configuration. No suspicious mass. No intrahepatic or extrahepatic bile duct dilation. No calcified gallstones.  Normal gallbladder wall thickness. No pericholecystic inflammatory changes. Pancreas: Unremarkable. No pancreatic ductal dilatation or surrounding inflammatory changes. Spleen: Within normal limits. No focal lesion. Adrenals/Urinary Tract: Adrenal glands are unremarkable. No suspicious renal mass. There is a 2.3 x 2.4 cm simple cyst in the left kidney interpolar region, anteriorly. There are additional several smaller cysts throughout bilateral kidneys. No hydronephrosis. No renal or ureteric calculi. Unremarkable urinary bladder. Stomach/Bowel: There is a small sliding hiatal hernia. No disproportionate dilation of the small or large bowel loops. No evidence of abnormal bowel wall thickening or inflammatory changes. The appendix is unremarkable. There are scattered diverticula mainly in the sigmoid colon, without imaging signs of diverticulitis. Vascular/Lymphatic: No ascites or pneumoperitoneum. No abdominal or pelvic lymphadenopathy, by size criteria. No aneurysmal dilation of the major abdominal arteries. There are mild peripheral atherosclerotic vascular calcifications of the aorta and its major branches. Reproductive: The uterus is unremarkable. There is a 3.5 x 4.3 cm hypoattenuating structure in the left adnexa, most likely ovarian in etiology. The structure exhibits fluid attenuation and has no discrete mural nodule. Even though this is incompletely characterized on the current exam, favored to represent a senescent ovarian cyst. Annual follow-up ultrasound evaluation is recommended to documenting stability. Other: There is a tiny fat containing umbilical hernia. The soft tissues and abdominal wall are otherwise  unremarkable. Musculoskeletal: No suspicious osseous lesions. There are moderate multilevel degenerative changes in the visualized spine. Review of the MIP images confirms the above findings. IMPRESSION: 1. No embolism to the proximal subsegmental pulmonary artery level. 2. There are multiple solid  noncalcified lung nodules, compatible with metastases. 3. No acute inflammatory process identified within the abdomen or pelvis. 4. Multiple other nonacute observations, as described above. Electronically Signed   By: Ree Molt M.D.   On: 12/21/2023 11:18   DG Chest Port 1 View Result Date: 12/21/2023 CLINICAL DATA:  89 year old female with possible sepsis. EXAM: PORTABLE CHEST 1 VIEW COMPARISON:  Portable chest 02/12/2023 and earlier. FINDINGS: Portable AP semi upright view at 0844 hours. Low lung volumes not significantly changed from last year. Stable cardiac size and mediastinal contours. Visualized tracheal air column is within normal limits. Allowing for portable technique the lungs are clear. Chronic cervical ACDF. Stable visualized osseous structures. Negative visible bowel gas. IMPRESSION: Stable with chronically low lung volumes. No acute cardiopulmonary abnormality. Electronically Signed   By: VEAR Hurst M.D.   On: 12/21/2023 09:02        Scheduled Meds:  Chlorhexidine  Gluconate Cloth  6 each Topical Daily   heparin   5,000 Units Subcutaneous Q8H   hydrocortisone  sod succinate (SOLU-CORTEF ) inj  100 mg Intravenous Q12H   Continuous Infusions:  azithromycin  500 mg (12/22/23 1643)   ceFEPime  (MAXIPIME ) IV Stopped (12/22/23 2235)   norepinephrine  (LEVOPHED ) Adult infusion Stopped (12/22/23 9171)          Sophie Mao, MD [facility] 12/23/2023, 7:48 AM

## 2023-12-23 NOTE — Evaluation (Signed)
 Physical Therapy Evaluation Patient Details Name: Eileen Wilkerson MRN: [id number] DOB: [birth year] Today's Date: 12/23/2023  History of Present Illness  89 year old female presented with fever, confusion and was found to be hypotensive, tachycardic and admitted 12/21/23 for Septic shock: Present on admission.  Past medical history of rheumatoid arthritis on chronic steroids, chronic adrenal insufficiency, chronic diastolic heart failure, CKD stage IIIa, fibromyalgia, hypertension, asthma, DVT, back surgeries and other comorbidities  Clinical Impression  Pt admitted with above diagnosis.  Pt currently with functional limitations due to the deficits listed below (see PT Problem List). Pt will benefit from acute skilled PT to increase their independence and safety with mobility to allow discharge.  Pt reports right hip pain limiting her ambulation for approx one week prior to admission and states she has not told MD yet (RN also present during session).  Pt from ALF and typically ambulatory in her apartment with rollator, uses wheelchair for longer distances.  Pt currently requiring assist and would benefit from continued inpatient follow up therapy, <3 hours/day.          If plan is discharge home, recommend the following: A little help with walking and/or transfers;A little help with bathing/dressing/bathroom;Assist for transportation;Assistance with cooking/housework   Can travel by private [employer] (2 wheels)  Recommendations for Other Services       Functional Status Assessment Patient has had a recent decline in their functional status and demonstrates the ability to make significant improvements in function in a reasonable and predictable amount of time.     Precautions / Restrictions Precautions Precautions: Fall Precaution Comments: urinary incontinence      Mobility  Bed Mobility Overal bed mobility: Needs Assistance Bed  Mobility: Supine to Sit     Supine to sit: HOB elevated, Min assist     General bed mobility comments: assist for trunk upright, increased time and effort    Transfers Overall transfer level: Needs assistance Equipment used: Rolling walker (2 wheels) Transfers: Sit to/from Stand, Bed to chair/wheelchair/BSC Sit to Stand: Min assist, +2 safety/equipment   Step pivot transfers: Min assist, +2 safety/equipment       General transfer comment: pt declined need for RW with transfer to [facility], pt with urinary incontinence upon transferring to [facility], assist for stability and cues for safety; RW provided for standing and stepping from [facility] to recliner; RN present to assist and provide pericare (pt reports she typically wears depends and an extra pad at baseline)    Ambulation/Gait                  Stairs            Wheelchair Mobility     Tilt Bed    Modified Rankin (Stroke Patients Only)       Balance Overall balance assessment: Mild deficits observed, not formally tested, Needs assistance         Standing balance support: Bilateral upper extremity supported, During functional activity Standing balance-Leahy Scale: Poor                               Pertinent Vitals/Pain Pain Assessment Pain Assessment: Faces Faces Pain Scale: Hurts even more Pain Location: right hip with mobilizing Pain Descriptors / Indicators: Grimacing, Guarding, Sharp Pain Intervention(s): Repositioned, Monitored during session    Home Living Family/patient expects to be discharged to:: Assisted living Living Arrangements: Alone  Home Equipment: Rollator (4 wheels);Wheelchair - manual;Shower seat;Hand held shower head;Adaptive equipment      Prior Function Prior Level of Function : Needs assist             Mobility Comments: uses rollator for household distances, wheelchair for longer distances; has lunch brought to room otherwise uses w/c to get  to dining hall ADLs Comments: performs ADLs unassisted. help for homemaking, meals, transportation.     Extremity/Trunk Assessment   Upper Extremity Assessment Upper Extremity Assessment: RUE deficits/detail RUE Deficits / Details: hx RA, receiving therapy on R hand per pt report    Lower Extremity Assessment Lower Extremity Assessment: Generalized weakness;RLE deficits/detail RLE Deficits / Details: reports recent right hip pain (about a week) limiting ambulation       Communication   Communication Communication: Hearing impairment  Cognition Arousal: Alert Behavior During Therapy: WFL for tasks assessed/performed, Flat affect Overall Cognitive Status: Within Functional Limits for tasks assessed                                          General Comments      Exercises     Assessment/Plan    PT Assessment Patient needs continued PT services  PT Problem List Decreased strength;Decreased activity tolerance;Decreased balance;Decreased mobility;Pain       PT Treatment Interventions Gait training;DME instruction;Balance training;Therapeutic activities;Therapeutic exercise;Patient/family education;Functional mobility training    PT Goals (Current goals can be found in the Care Plan section)  Acute Rehab PT Goals PT Goal Formulation: With patient Time For Goal Achievement: 01/06/24 Potential to Achieve Goals: Good    Frequency Min 1X/week     Co-evaluation               AM-PAC PT 6 Clicks Mobility  Outcome Measure Help needed turning from your back to your side while in a flat bed without using bedrails?: A Little Help needed moving from lying on your back to sitting on the side of a flat bed without using bedrails?: A Little Help needed moving to and from a bed to a chair (including a wheelchair)?: A Little Help needed standing up from a chair using your arms (e.g., wheelchair or bedside chair)?: A Little Help needed to walk in hospital  room?: A Little Help needed climbing 3-5 steps with a railing? : A Lot 6 Click Score: 17    End of Session   Activity Tolerance: Patient tolerated treatment well Patient left: in chair;with chair alarm set;with call bell/phone within reach;with nursing/sitter in room Nurse Communication: Mobility status PT Visit Diagnosis: Difficulty in walking, not elsewhere classified (R26.2);Unsteadiness on feet (R26.81)    Time: 8557-8497 PT Time Calculation (min) (ACUTE ONLY): 20 min   Charges:   PT Evaluation $PT Eval Low Complexity: 1 Low   PT General Charges $$ ACUTE PT VISIT: 1 Visit    Tari PT, DPT Physical Therapist Acute Rehabilitation Services Office: [phone]   Tari CROME Payson 12/23/2023, 3:26 PM

## 2023-12-23 NOTE — Telephone Encounter (Signed)
 Patient needs a nodule consult in about 3-4 weeks. Thank you

## 2023-12-24 ENCOUNTER — Encounter: Payer: Self-pay | Admitting: Nurse Practitioner

## 2023-12-24 DIAGNOSIS — R6521 Severe sepsis with septic shock: Secondary | ICD-10-CM | POA: Diagnosis not present

## 2023-12-24 DIAGNOSIS — Z515 Encounter for palliative care: Secondary | ICD-10-CM | POA: Diagnosis not present

## 2023-12-24 DIAGNOSIS — A4151 Sepsis due to Escherichia coli [E. coli]: Secondary | ICD-10-CM

## 2023-12-24 DIAGNOSIS — G9341 Metabolic encephalopathy: Secondary | ICD-10-CM | POA: Diagnosis not present

## 2023-12-24 DIAGNOSIS — Z7189 Other specified counseling: Secondary | ICD-10-CM | POA: Diagnosis not present

## 2023-12-24 LAB — CBC WITH DIFFERENTIAL/PLATELET
Abs Immature Granulocytes: 0.02 10*3/uL (ref 0.00–0.07)
Basophils Absolute: 0 10*3/uL (ref 0.0–0.1)
Basophils Relative: 0 %
Eosinophils Absolute: 0.1 10*3/uL (ref 0.0–0.5)
Eosinophils Relative: 1 %
HCT: 32.8 % — ABNORMAL LOW (ref 36.0–46.0)
Hemoglobin: 10.3 g/dL — ABNORMAL LOW (ref 12.0–15.0)
Immature Granulocytes: 0 %
Lymphocytes Relative: 49 %
Lymphs Abs: 3.9 10*3/uL (ref 0.7–4.0)
MCH: 29 pg (ref 26.0–34.0)
MCHC: 31.4 g/dL (ref 30.0–36.0)
MCV: 92.4 fL (ref 80.0–100.0)
Monocytes Absolute: 0.6 10*3/uL (ref 0.1–1.0)
Monocytes Relative: 7 %
Neutro Abs: 3.5 10*3/uL (ref 1.7–7.7)
Neutrophils Relative %: 43 %
Platelets: 156 10*3/uL (ref 150–400)
RBC: 3.55 MIL/uL — ABNORMAL LOW (ref 3.87–5.11)
RDW: 16.5 % — ABNORMAL HIGH (ref 11.5–15.5)
WBC: 8.1 10*3/uL (ref 4.0–10.5)
nRBC: 0 % (ref 0.0–0.2)

## 2023-12-24 LAB — BASIC METABOLIC PANEL
Anion gap: 4 — ABNORMAL LOW (ref 5–15)
BUN: 15 mg/dL (ref 8–23)
CO2: 23 mmol/L (ref 22–32)
Calcium: 8.8 mg/dL — ABNORMAL LOW (ref 8.9–10.3)
Chloride: 113 mmol/L — ABNORMAL HIGH (ref 98–111)
Creatinine, Ser: 0.84 mg/dL (ref 0.44–1.00)
GFR, Estimated: >60 mL/min (ref >60)
Glucose, Bld: 85 mg/dL (ref 70–99)
Potassium: 3.2 mmol/L — ABNORMAL LOW (ref 3.5–5.1)
Sodium: 140 mmol/L (ref 135–145)

## 2023-12-24 LAB — MAGNESIUM: Magnesium: 2.3 mg/dL (ref 1.7–2.4)

## 2023-12-24 MED ORDER — ORAL CARE MOUTH RINSE: 15.0000 mL | OROMUCOSAL | Status: DC | PRN

## 2023-12-24 MED ORDER — POTASSIUM CHLORIDE CRYS ER 20 MEQ PO TBCR
40.0000 meq | EXTENDED_RELEASE_TABLET | ORAL | Status: AC
Start: 2023-12-24 — End: 2023-12-24
  Administered 2023-12-24 (×2): 40 meq via ORAL
  Filled 2023-12-24 (×2): qty 2

## 2023-12-24 NOTE — Consult Note (Signed)
 Consultation Note Date: 12/24/2023   Patient Name: Eileen Wilkerson  DOB: [birth year]  MRN: [id number]  Age / Sex: 89 y.o., female  PCP: Mast, [name], NP Referring Physician: Cheryle Page, MD  Reason for Consultation: Establishing goals of care  HPI/Patient Profile: 89 y.o. female  admitted on 12/21/2023   Clinical Assessment and Goals of Care: 10 year old lady from assisted living at friend's home, past medical history of rheumatoid arthritis on chronic steroids, chronic adrenal insufficiency, chronic diastolic heart failure stage III chronic kidney disease fibromyalgia hypertension asthma. Patient admitted with fever and confusion admitted to hospital medicine service for septic shock unclear source was also found to have lactic acidosis.  Was initially given broad-spectrum antibiotics IV fluids and also pressors.  Subsequently weaned off of pressors, at present, possible viral gastroenteritis complicated by underlying adrenal insufficiency is a diagnosis being considered.  Patient was initially on IV hydrocortisone  and now switched back to oral prednisone . Acute metabolic encephalopathy has improved, patient making attempts to participate with PT OT.  Palliative consult for goals of care discussions. Chart reviewed, patient seen and examined.  Patient has advance care planning documents-living will uploaded on the chart this has been reviewed.  Call placed however unable to reach daughter Eileen Wilkerson at this time. Palliative medicine is specialized medical care for people living with serious illness. It focuses on providing relief from the symptoms and stress of a serious illness. The goal is to improve quality of life for both the patient and the family. Goals of care: Broad aims of medical therapy in relation to the patient's values and preferences. Our aim is to provide medical care aimed at enabling patients  to achieve the goals that matter most to them, given the circumstances of their particular medical situation and their constraints.    HCPOA Daughter Eileen Wilkerson  SUMMARY OF RECOMMENDATIONS   Goals of care discussions undertaken with the patient.  She is awake alert and able to answer questions appropriately.  She states that she lives at assisted living at a friend's home and basically lives in an apartment by herself.  Her daughter is her healthcare power of attorney agent.  We discussed about issues pertaining to current hospitalization.  Patient feels that she might be slowly getting better.  She is able to participate with PT.  Discussed with her about PT recommendation for her considering SNF rehab towards the end of this hospitalization.  Patient is in agreement.  Recommend outpatient palliative services.  Code Status/Advance Care Planning: Full code   Symptom Management:     Palliative Prophylaxis:  Delirium Protocol   Psycho-social/Spiritual:  Desire for further Chaplaincy support:yes Additional Recommendations: Caregiving  Support/Resources  Prognosis:  Unable to determine  Discharge Planning: Skilled Nursing Facility for rehab with Palliative care service follow-up      Primary Diagnoses: Present on Admission:  Sepsis (HCC)   I have reviewed the medical record, interviewed the patient and family, and examined the patient. The following aspects are pertinent.  Past Medical History:  Diagnosis Date  Adrenal failure (HCC)    Arthritis    Asthma    Cancer (HCC)    Cataract    Closed nondisplaced fracture of fifth right metatarsal bone 09/18/2017   Diverticulitis    Per patient   E coli bacteremia    Fibromyalgia 2008   GERD (gastroesophageal reflux disease) 12/18/2016   HCAP (healthcare-associated pneumonia) 02/03/2018   Hyperlipidemia 10/13/2021   Osteoporosis    RA (rheumatoid arthritis) (HCC)    Recurrent upper respiratory infection (URI)    Sepsis due  to urinary tract infection (HCC) 01/18/2018   Urticaria    Social History   Socioeconomic History   Marital status: Widowed    Spouse name: Not on file   Number of children: 2   Years of education: Not on file   Highest education level: Not on file  Occupational History   Occupation: Retired Child Psychotherapist  Tobacco Use   Smoking status: Former    Current packs/day: 0.00    Average packs/day: 0.8 packs/day for 10.0 years (7.5 ttl pk-yrs)    Types: Cigarettes    Start date: 22    Quit date: 1968    Years since quitting: 57.0    Passive exposure: Never   Smokeless tobacco: Never  Vaping Use   Vaping status: Never Used  Substance and Sexual Activity   Alcohol use: No   Drug use: No   Sexual activity: Not Currently  Other Topics Concern   Not on file  Social History Narrative      Diet:        Do you drink/ eat things with caffeine? Dr. Nunzio 2/ day      Marital status: Widowed                              What year were you married ? 1953      Do you live in a house, apartment,assistred living, condo, trailer, etc.)? Apartment      Is it one or more stories?       How many persons live in your home ? 1      Do you have any pets in your home ?(please list) No      Highest Level of education completed: PhD       Current or past profession: Paramedic, Child Psychotherapist, Special Educator       Do you exercise?   No                           Type & how often       ADVANCED DIRECTIVES (Please bring copies)      Do you have a living will? Yes      Do you have a DNR form?                       If not, do you want to discuss one? Yes      Do you have signed POA?HPOA forms?                 If so, please bring to your appointment Tes      FUNCTIONAL STATUS- To be completed by Spouse / child / Staff       Do you have difficulty bathing or dressing yourself ? No      Do you have difficulty preparing food or eating ? No  Do you have difficulty managing your  mediation ? No      Do you have difficulty managing your finances ? No      Do you have difficulty affording your medication ? No      Social Drivers of Corporate Investment Banker Strain: Not on file  Food Insecurity: No Food Insecurity (12/21/2023)   Hunger Vital Sign    Worried About Running Out of Food in the Last Year: Never true    Ran Out of Food in the Last Year: Never true  Transportation Needs: No Transportation Needs (12/21/2023)   PRAPARE - Administrator, Civil Service (Medical): No    Lack of Transportation (Non-Medical): No  Physical Activity: Not on file  Stress: Not on file  Social Connections: Not on file   Family History  Problem Relation Age of Onset   Heart attack Maternal Grandmother    Heart attack Paternal Grandfather    Breast cancer Mother 29   Diabetes Father    Heart disease Father    Congestive Heart Failure Father 76       Died from   Allergic rhinitis Neg Hx    Asthma Neg Hx    Eczema Neg Hx    Urticaria Neg Hx    Scheduled Meds:  azithromycin   500 mg Oral Daily   heparin   5,000 Units Subcutaneous Q8H   potassium chloride   40 mEq Oral Q4H   predniSONE   5 mg Oral Q breakfast   Continuous Infusions:  ceFEPime  (MAXIPIME ) IV 2 g (12/24/23 0901)   PRN Meds:.acetaminophen , docusate sodium , lip balm, polyethylene glycol Medications Prior to Admission:  Prior to Admission medications   Medication Sig Start Date End Date Taking? Authorizing Provider  acetaminophen  (TYLENOL ) 500 MG tablet Take 650 mg by mouth in the morning and at bedtime.   Yes [provider]  amLODipine  (NORVASC ) 5 MG tablet Take 5 mg by mouth daily.   Yes [provider]  atorvastatin  (LIPITOR) 20 MG tablet TAKE 1 TABLET BY MOUTH EVERY DAY 03/26/23  Yes Mast, [name], NP  Biotin  5 MG TBDP Take 1 tablet (5 mg total) by mouth daily. 06/20/22  Yes Medina-Vargas, Monina C, NP  Cholecalciferol  (VITAMIN D3) 125 MCG (5000 UT) CAPS TAKE 1 CAPSULE BY MOUTH  EVERY DAY 07/24/22  Yes Mast, [name], NP  colchicine  0.6 MG tablet TAKE 1 TABLET BY MOUTH EVERY DAY 08/28/22  Yes Mast, [name], NP  CRANBERRY PO Take 1 capsule by mouth 2 (two) times daily.   Yes [provider]  cycloSPORINE  (RESTASIS ) 0.05 % ophthalmic emulsion Place 1 drop into both eyes 2 (two) times daily. 06/20/22  Yes Medina-Vargas, Monina C, NP  D-Mannose 500 MG CAPS Take 500 mg by mouth daily.   Yes [provider]  diclofenac  Sodium (VOLTAREN ) 1 % GEL APPLY 2 TO 4 GRAMS TOPICALLY TO AFFECTED JOINTS UP TO 4 TIMES DAILY 06/20/22  Yes Medina-Vargas, Monina C, NP  fexofenadine  (ALLEGRA ) 180 MG tablet Take 1 tablet (180 mg total) by mouth daily. 06/20/22  Yes Medina-Vargas, Monina C, NP  ipratropium-albuterol  (DUONEB) 0.5-2.5 (3) MG/3ML SOLN Take 3 mLs by nebulization as needed.   Yes [provider]  lansoprazole  (PREVACID ) 30 MG capsule TAKE 1 CAPSULE BY MOUTH ONCE DAILY AT NOON Patient taking differently: Take 30 mg by mouth daily in the afternoon. 10/10/22  Yes Mast, [name], NP  montelukast  (SINGULAIR ) 10 MG tablet TAKE 1 TABLET BY MOUTH EVERYDAY  AT BEDTIME Patient taking differently: Take 10 mg by mouth at bedtime. 08/28/22  Yes Mast, [name], NP  ondansetron  (ZOFRAN ) 4 MG tablet Take 4 mg by mouth as needed for nausea or vomiting.   Yes [provider]  potassium chloride  SA (KLOR-CON  M) 20 MEQ tablet Take 40 mEq by mouth 2 (two) times daily.   Yes [provider]  saccharomyces boulardii (FLORASTOR) 250 MG capsule Take 250 mg by mouth 2 (two) times daily.   Yes [provider]  Torsemide 40 MG TABS Take 40 mg by mouth daily. 09/04/23  Yes [provider]  traMADol  (ULTRAM ) 50 MG tablet Take 1 tablet (50 mg total) by mouth every 6 (six) hours as needed (pain). 09/18/23  Yes Medina-Vargas, Monina C, NP  predniSONE  (DELTASONE ) 5 MG tablet Take 5 mg by mouth daily with breakfast.    [provider]   Allergies  Allergen Reactions    Adhesive [Tape] Rash   Celebrex [Celecoxib] Hives   Ciprofibrate Nausea Only   Cymbalta [Duloxetine Hcl] Swelling   Gabitril [Tiagabine] Swelling   Lyrica [Pregabalin] Swelling   Neurontin [Gabapentin] Swelling   Nexium  [Esomeprazole ] Rash   Nsaids Rash   Shrimp [Shellfish Allergy] Anaphylaxis    Per patient shrimp only   Azactam  [Aztreonam ]     Hand swelling    Azelastine  Hcl     Rash    Ciprofloxacin  Other (See Comments)    dizziness   Claritin  [Loratadine ]     Irritability Nervousness    Methotrexate  Derivatives    Nasacort  [Triamcinolone ]     Dizzy    Olopatadine  Other (See Comments)    Pain and lethargy    Other    Sulfamethizole Other (See Comments)    unknown   Zantac [Ranitidine Hcl] Other (See Comments)    unknown   Claritin -D 12 Hour [Loratadine -Pseudoephedrine Er] Anxiety   Keflex  [Cephalexin ] Nausea And Vomiting    Tolerated Ancef    Penicillins Rash    Injection site reaction. Tolerated cefepime  in past. Also reports tolerating a penicillin infusion after this initial rxn ~20 yrs ago.  Has patient had a PCN reaction causing immediate rash, facial/tongue/throat swelling, SOB or lightheadedness with hypotension: No Has patient had a PCN reaction causing severe rash involving mucus membranes or skin necrosis: No Has patient had a PCN reaction that required hospitalization No Has patient had a PCN reaction occurring within the last 10 years: No     Sulfa Antibiotics Nausea And Vomiting   Review of Systems +weakness  Physical Exam Elderly lady resting in bed Awake alert oriented Appears chronically ill and deconditioned Regular work of breathing Trace edema Answers questions appropriately  Vital Signs: BP (!) 151/64 (BP Location: Right Arm)   Pulse (!) 55   Temp 98 F (36.7 C) (Oral)   Resp 16   Ht 5' 4 (1.626 m)   Wt 81.1 kg   SpO2 95%   BMI 30.69 kg/m  Pain Scale: 0-10   Pain Score: 0-No pain   SpO2: SpO2: 95 % O2 Device:SpO2: 95  % O2 Flow Rate: .O2 Flow Rate (L/min): 2 L/min  IO: Intake/output summary:  Intake/Output Summary (Last 24 hours) at 12/24/2023 1248 Last data filed at 12/24/2023 1000 Gross per 24 hour  Intake 580 ml  Output --  Net 580 ml    LBM: Last BM Date : 12/23/23 Baseline Weight: Weight: 73.5 kg Most recent weight: Weight: 81.1 kg     Palliative Assessment/Data:   PPS 60%  Time In:  11.40 Time Out:  12.40 Time Total:  60  Greater than 50%  of this time was spent counseling and coordinating care related to the above assessment and plan.  Signed by: Lonia Serve, MD   Please contact Palliative Medicine Team phone at [phone] for questions and concerns.  For individual provider: See Tracey

## 2023-12-24 NOTE — Progress Notes (Signed)
 PROGRESS NOTE    Eileen Wilkerson  FMW:[id number] DOB: 06-16-1932 DOA: 12/21/2023 PCP: Mast, [name], NP   Brief Narrative:  89 year old female with history of rheumatoid arthritis on chronic steroids, chronic adrenal insufficiency, chronic diastolic heart failure, CKD stage IIIa, fibromyalgia, hypertension, asthma and other comorbidities presented with fever, confusion and was found to be hypotensive, tachycardic in the ED.  She was started on IV fluids, blood pressure remained low and she was started on IV pressors along with broad-spectrum antibiotics and fluids.SABRA  COVID/influenza/RSV PCR negative.  Lactate was 2.7 and 2.1.  CT was negative for pulmonary embolism but showed multiple solid noncalcified lung nodules, no acute inflammatory process in the abdomen or pelvis but showed possible ovarian cyst.  She was transferred to [facility] service from 12/23/2023 onwards.  Assessment & Plan:   Septic shock: Present on admission, source currently unclear Lactic acidosis -Presented in septic shock and was admitted to ICU under PCCM service requiring broad-spectrum antibiotics, IV fluids and pressors.  Currently off of pressors.  Blood pressure creeping upwards.  COVID/influenza/RSV PCR negative.  Respiratory panel PCR negative.  Blood cultures have been negative so far. -Currently on broad-spectrum antibiotics.  Vancomycin  has been discontinued. -No clear-cut source of septic shock identified: Might have had some sort of viral gastroenteritis complicated by underlying adrenal insufficiency.  No diarrhea since admission.  DC'd stool testing and isolation  History of adrenal insufficiency History of rheumatoid arthritis -Initially on IV hydrocortisone .  Switched back to prednisone  5 mg daily from today onwards.  Outpatient follow-up with endocrinology and rheumatology  Acute metabolic encephalopathy -Possibly from above.  Mental status has much improved.  Fall precautions.  PT recommending SNF  placement  Physical deconditioning -PT recommending SNF placement.  TOC consult  AKI on CKD stage IIIa -Creatinine has normalized with IV fluids. -Monitor  Hypokalemia -Replace.  Repeat a.m. labs  Hypertension -Blood pressure improving.  Antihypertensives remain on hold for now.  Hypokalemia -Improved  Nausea and vomiting -CT abdomen negative for acute inflammatory process.  Improved.  Currently tolerating regular diet.  Hypomagnesemia -Improved  Obesity -Outpatient follow-up  Ovarian mass as well as multiple solid noncalcified lung nodules -Imaging suspicious of possible ovarian cyst: Recommended outpatient follow-up with annual ultrasound evaluation.  Patient will need outpatient follow-up with pulmonary regarding follow-up of lung nodules, worrisome for malignancy  Goals of care -Patient is currently listed as full code.  Consult palliative care for goals of care discussion.   DVT prophylaxis: Heparin  subcutaneous Code Status: Full Family Communication: None at bedside Disposition Plan: Status is: Inpatient Remains inpatient appropriate because: Of severity of illness.  Need for SNF placement.  Currently medically stable for discharge to SNF    Consultants: PCCM  Procedures: None  Antimicrobials:  Anti-infectives (From admission, onward)    Start     Dose/Rate Route Frequency Ordered Stop   12/23/23 1400  azithromycin  (ZITHROMAX ) tablet 500 mg        500 mg Oral Daily 12/23/23 0803 12/26/23 1359   12/23/23 1000  vancomycin  (VANCOREADY) IVPB 1250 mg/250 mL  Status:  Discontinued        1,250 mg 166.7 mL/hr over 90 Minutes Intravenous Every 48 hours 12/21/23 1523 12/22/23 0926   12/22/23 2000  ceFEPIme  (MAXIPIME ) 2 g in sodium chloride  0.9 % 100 mL IVPB        2 g 200 mL/hr over 30 Minutes Intravenous Every 12 hours 12/22/23 0920     12/22/23 1000  ceFEPIme  (MAXIPIME ) 2 g in  sodium chloride  0.9 % 100 mL IVPB  Status:  Discontinued        2 g 200 mL/hr  over 30 Minutes Intravenous Every 24 hours 12/21/23 1523 12/22/23 0920   12/21/23 1615  vancomycin  (VANCOCIN ) 500 mg in sodium chloride  0.9 % 100 mL IVPB        500 mg 100 mL/hr over 60 Minutes Intravenous  Once 12/21/23 1608 12/21/23 1712   12/21/23 1530  vancomycin  (VANCOREADY) IVPB 500 mg/100 mL  Status:  Discontinued        500 mg 100 mL/hr over 60 Minutes Intravenous  Once 12/21/23 1518 12/21/23 1608   12/21/23 1500  azithromycin  (ZITHROMAX ) 500 mg in sodium chloride  0.9 % 250 mL IVPB  Status:  Discontinued        500 mg 250 mL/hr over 60 Minutes Intravenous Every 24 hours 12/21/23 1414 12/23/23 0803   12/21/23 1045  vancomycin  (VANCOCIN ) IVPB 1000 mg/200 mL premix        1,000 mg 200 mL/hr over 60 Minutes Intravenous  Once 12/21/23 1032 12/21/23 2051   12/21/23 0845  ceFEPIme  (MAXIPIME ) 2 g in sodium chloride  0.9 % 100 mL IVPB        2 g 200 mL/hr over 30 Minutes Intravenous  Once 12/21/23 0836 12/21/23 0957   12/21/23 0845  metroNIDAZOLE  (FLAGYL ) IVPB 500 mg        500 mg 100 mL/hr over 60 Minutes Intravenous  Once 12/21/23 0836 12/21/23 1025        Subjective: Patient seen and examined at bedside.  Still feels weak but slightly better.  No vomiting, worsening abdominal pain, fever reported. Objective: Vitals:   12/23/23 1310 12/23/23 1943 12/24/23 0411 12/24/23 0412  BP: 118/60 (!) 138/59 (!) 151/64   Pulse: 65 (!) 47 (!) 55   Resp: 20 18 16    Temp: (!) 97.3 F (36.3 C) 97.6 F (36.4 C) 98 F (36.7 C)   TempSrc: Oral Oral Oral   SpO2: 93% 95% 95%   Weight:    81.1 kg  Height:        Intake/Output Summary (Last 24 hours) at 12/24/2023 0919 Last data filed at 12/23/2023 2200 Gross per 24 hour  Intake 440 ml  Output --  Net 440 ml   Filed Weights   12/22/23 0500 12/23/23 0432 12/24/23 0412  Weight: 76.4 kg 79.3 kg 81.1 kg    Examination:  General: Currently on room air.  No distress.  Chronically ill and deconditioned looking.  Elderly female lying in  bed. ENT/neck: No thyromegaly.  JVD is not elevated  respiratory: Decreased breath sounds at bases bilaterally with some crackles; no wheezing  CVS: S1-S2 heard, bradycardic dermatology  abdominal: Soft, nontender, slightly distended; no organomegaly,l bowel sounds are heard Extremities: Trace lower extremity edema; no cyanosis  CNS: Wakes up slightly, slow to respond.  No focal neurologic deficit.  Moves extremities Lymph: No obvious lymphadenopathy Skin: No obvious ecchymosis/lesions  psych: Currently not agitated.  Affect is flat mostly. musculoskeletal: No obvious joint swelling/deformity    Data Reviewed: I have personally reviewed following labs and imaging studies  CBC: Recent Labs  Lab 12/21/23 0900 12/21/23 1639 12/22/23 0338 12/24/23 0346  WBC 9.9 8.0 7.5 8.1  NEUTROABS 6.8  --   --  3.5  HGB 13.7 11.3* 10.6* 10.3*  HCT 44.7 34.8* 34.1* 32.8*  MCV 94.1 91.6 93.9 92.4  PLT 226 190 169 156   Basic Metabolic Panel: Recent Labs  Lab 12/21/23 0900 12/21/23  1639 12/22/23 0338 12/24/23 0346  NA 141  --  143 140  K 3.2*  --  3.8 3.2*  CL 107  --  111 113*  CO2 25  --  23 23  GLUCOSE 107*  --  117* 85  BUN 17  --  15 15  CREATININE 1.30* 1.07* 0.96 0.84  CALCIUM  8.8*  --  8.2* 8.8*  MG  --  1.6*  --  2.3  PHOS  --  3.6  --   --    GFR: Estimated Creatinine Clearance: 46.8 mL/min (by C-G formula based on SCr of 0.84 mg/dL). Liver Function Tests: Recent Labs  Lab 12/21/23 0900 12/22/23 0338  AST 50* 45*  ALT 28 26  ALKPHOS 75 57  BILITOT 0.8 0.6  PROT 7.0 5.2*  ALBUMIN  3.3* 2.5*   Recent Labs  Lab 12/21/23 1639  LIPASE 20   Recent Labs  Lab 12/21/23 1130  AMMONIA 23   Coagulation Profile: Recent Labs  Lab 12/21/23 0900  INR 1.3*   Cardiac Enzymes: No results for input(s): CKTOTAL, CKMB, CKMBINDEX, TROPONINI in the last 168 hours. BNP (last 3 results) No results for input(s): PROBNP in the last 8760 hours. HbA1C: No results  for input(s): HGBA1C in the last 72 hours. CBG: No results for input(s): GLUCAP in the last 168 hours. Lipid Profile: No results for input(s): CHOL, HDL, LDLCALC, TRIG, CHOLHDL, LDLDIRECT in the last 72 hours. Thyroid  Function Tests: No results for input(s): TSH, T4TOTAL, FREET4, T3FREE, THYROIDAB in the last 72 hours. Anemia Panel: No results for input(s): VITAMINB12, FOLATE, FERRITIN, TIBC, IRON, RETICCTPCT in the last 72 hours. Sepsis Labs: Recent Labs  Lab 12/21/23 0912 12/21/23 1140 12/21/23 1639 12/21/23 1832  PROCALCITON  --   --  4.28  --   LATICACIDVEN 2.7* 2.1* 1.0 1.0    Recent Results (from the past 240 hours)  Resp panel by RT-PCR (RSV, Flu A&B, Covid) Anterior Nasal Swab     Status: None   Collection Time: 12/21/23  8:35 AM   Specimen: Anterior Nasal Swab  Result Value Ref Range Status   SARS Coronavirus 2 by RT PCR NEGATIVE NEGATIVE Final    Comment: (NOTE) SARS-CoV-2 target nucleic acids are NOT DETECTED.  The SARS-CoV-2 RNA is generally detectable in upper respiratory specimens during the acute phase of infection. The lowest concentration of SARS-CoV-2 viral copies this assay can detect is 138 copies/mL. A negative result does not preclude SARS-Cov-2 infection and should not be used as the sole basis for treatment or other patient management decisions. A negative result may occur with  improper specimen collection/handling, submission of specimen other than nasopharyngeal swab, presence of viral mutation(s) within the areas targeted by this assay, and inadequate number of viral copies(<138 copies/mL). A negative result must be combined with clinical observations, patient history, and epidemiological information. The expected result is Negative.  Fact Sheet for Patients:  bloggercourse.com  Fact Sheet for Healthcare Providers:  seriousbroker.it  This test is no t yet  approved or cleared by the [place]  FDA and  has been authorized for detection and/or diagnosis of SARS-CoV-2 by FDA under an Emergency Use Authorization (EUA). This EUA will remain  in effect (meaning this test can be used) for the duration of the COVID-19 declaration under Section 564(b)(1) of the Act, 21 U.S.C.section 360bbb-3(b)(1), unless the authorization is terminated  or revoked sooner.       Influenza A by PCR NEGATIVE NEGATIVE Final   Influenza B by PCR  NEGATIVE NEGATIVE Final    Comment: (NOTE) The Xpert Xpress SARS-CoV-2/FLU/RSV plus assay is intended as an aid in the diagnosis of influenza from Nasopharyngeal swab specimens and should not be used as a sole basis for treatment. Nasal washings and aspirates are unacceptable for Xpert Xpress SARS-CoV-2/FLU/RSV testing.  Fact Sheet for Patients: bloggercourse.com  Fact Sheet for Healthcare Providers: seriousbroker.it  This test is not yet approved or cleared by the [place]  FDA and has been authorized for detection and/or diagnosis of SARS-CoV-2 by FDA under an Emergency Use Authorization (EUA). This EUA will remain in effect (meaning this test can be used) for the duration of the COVID-19 declaration under Section 564(b)(1) of the Act, 21 U.S.C. section 360bbb-3(b)(1), unless the authorization is terminated or revoked.     Resp Syncytial Virus by PCR NEGATIVE NEGATIVE Final    Comment: (NOTE) Fact Sheet for Patients: bloggercourse.com  Fact Sheet for Healthcare Providers: seriousbroker.it  This test is not yet approved or cleared by the [place]  FDA and has been authorized for detection and/or diagnosis of SARS-CoV-2 by FDA under an Emergency Use Authorization (EUA). This EUA will remain in effect (meaning this test can be used) for the duration of the COVID-19 declaration under Section 564(b)(1) of  the Act, 21 U.S.C. section 360bbb-3(b)(1), unless the authorization is terminated or revoked.  Performed at [facility], [address] [place], [place] [address]   Blood Culture (routine x 2)     Status: None (Preliminary result)   Collection Time: 12/21/23  9:00 AM   Specimen: BLOOD  Result Value Ref Range Status   Specimen Description   Final    BLOOD RIGHT ANTECUBITAL Performed at [facility], [address] [place], [place] [address]    Special Requests   Final    BOTTLES DRAWN AEROBIC AND ANAEROBIC Blood Culture results may not be optimal due to an inadequate volume of blood received in culture bottles Performed at [facility], [address], [place], [place] [address]    Culture   Final    NO GROWTH 3 DAYS Performed at [facility], [address], [place], [place] [address]    Report Status PENDING  Incomplete  Blood Culture (routine x 2)     Status: None (Preliminary result)   Collection Time: 12/21/23  9:50 AM   Specimen: BLOOD RIGHT WRIST  Result Value Ref Range Status   Specimen Description   Final    BLOOD RIGHT WRIST Performed at [facility], [address] [place], [place] [address]    Special Requests   Final    BOTTLES DRAWN AEROBIC AND ANAEROBIC Blood Culture results may not be optimal due to an inadequate volume of blood received in culture bottles Performed at [facility], [address] [place], [place] [address]    Culture   Final    NO GROWTH 3 DAYS Performed at [facility], [address], [place], [place] [address]    Report Status PENDING  Incomplete  Respiratory (~20 pathogens) panel by PCR     Status: None   Collection Time: 12/21/23  4:48 PM   Specimen: Nasal Mucosa; Respiratory  Result Value Ref Range Status   Adenovirus NOT DETECTED NOT DETECTED Final   Coronavirus 229E NOT DETECTED NOT DETECTED Final    Comment: (NOTE) The Coronavirus  on the Respiratory Panel, DOES NOT test for the novel  Coronavirus (2019 nCoV)  Coronavirus HKU1 NOT DETECTED NOT DETECTED Final   Coronavirus NL63 NOT DETECTED NOT DETECTED Final   Coronavirus OC43 NOT DETECTED NOT DETECTED Final   Metapneumovirus NOT DETECTED NOT DETECTED Final   Rhinovirus / Enterovirus NOT DETECTED NOT DETECTED Final   Influenza A NOT DETECTED NOT DETECTED Final   Influenza B NOT DETECTED NOT DETECTED Final   Parainfluenza Virus 1 NOT DETECTED NOT DETECTED Final   Parainfluenza Virus 2 NOT DETECTED NOT DETECTED Final   Parainfluenza Virus 3 NOT DETECTED NOT DETECTED Final   Parainfluenza Virus 4 NOT DETECTED NOT DETECTED Final   Respiratory Syncytial Virus NOT DETECTED NOT DETECTED Final   Bordetella pertussis NOT DETECTED NOT DETECTED Final   Bordetella Parapertussis NOT DETECTED NOT DETECTED Final   Chlamydophila pneumoniae NOT DETECTED NOT DETECTED Final   Mycoplasma pneumoniae NOT DETECTED NOT DETECTED Final    Comment: Performed at [facility], [address], [place], [place] [address]  MRSA Next Gen by PCR, Nasal     Status: None   Collection Time: 12/21/23  4:48 PM   Specimen: Nasal Mucosa; Nasal Swab  Result Value Ref Range Status   MRSA by PCR Next Gen NOT DETECTED NOT DETECTED Final    Comment: (NOTE) The GeneXpert MRSA Assay (FDA approved for NASAL specimens only), is one component of a comprehensive MRSA colonization surveillance program. It is not intended to diagnose MRSA infection nor to guide or monitor treatment for MRSA infections. Test performance is not FDA approved in patients less than 67 years old. Performed at [facility], [address], [place], [place] [address]          Radiology Studies: No results found.       Scheduled Meds:  azithromycin   500 mg Oral Daily   heparin   5,000 Units Subcutaneous Q8H   potassium chloride   40 mEq Oral Q4H   predniSONE   5 mg Oral Q breakfast   Continuous  Infusions:  ceFEPime  (MAXIPIME ) IV 2 g (12/24/23 0901)          Sophie Mao, MD [facility] 12/24/2023, 9:19 AM

## 2023-12-24 NOTE — Plan of Care (Signed)
  Problem: Education: Goal: Knowledge of General Education information will improve Description: Including pain rating scale, medication(s)/side effects and non-pharmacologic comfort measures Outcome: Progressing   Problem: Activity: Goal: Risk for activity intolerance will decrease Outcome: Progressing   Problem: Nutrition: Goal: Adequate nutrition will be maintained Outcome: Progressing   Problem: Coping: Goal: Level of anxiety will decrease Outcome: Progressing   Problem: Elimination: Goal: Will not experience complications related to urinary retention Outcome: Progressing   Problem: Pain Management: Goal: General experience of comfort will improve Outcome: Progressing   Problem: Safety: Goal: Ability to remain free from injury will improve Outcome: Progressing   Problem: Skin Integrity: Goal: Risk for impaired skin integrity will decrease Outcome: Progressing

## 2023-12-24 NOTE — Progress Notes (Signed)
 Mobility Specialist - Progress Note   12/24/23 1445  Mobility  Activity Transferred from chair to bed  Level of Assistance Minimal assist, patient does 75% or more  Assistive Device Front wheel walker  Range of Motion/Exercises Active  Activity Response Tolerated well  Mobility Referral Yes  Mobility visit 1 Mobility  Mobility Specialist Start Time (ACUTE ONLY) 1435  Mobility Specialist Stop Time (ACUTE ONLY) 1445  Mobility Specialist Time Calculation (min) (ACUTE ONLY) 10 min   Pt requesting assistance back to bed. Was min-A for STS. At EOS was left in bed with all needs met. Call bell in reach and NT in room.  Erminio Leos Mobility Specialist

## 2023-12-24 NOTE — Progress Notes (Signed)
 Mobility Specialist - Progress Note   12/24/23 1034  Mobility  Activity Transferred from bed to chair  Level of Assistance Minimal assist, patient does 75% or more  Assistive Device Front wheel walker  Range of Motion/Exercises Active  Activity Response Tolerated well  Mobility Referral Yes  Mobility visit 1 Mobility  Mobility Specialist Start Time (ACUTE ONLY) 1024  Mobility Specialist Stop Time (ACUTE ONLY) 1034  Mobility Specialist Time Calculation (min) (ACUTE ONLY) 10 min   Pt was found in bed and agreeable to transfer to recliner chair. Had no complaints with session. At EOS was left on recliner chair with all needs met. Call bell in reach and chair alarm on.  Erminio Leos Mobility Specialist

## 2023-12-25 DIAGNOSIS — R6521 Severe sepsis with septic shock: Secondary | ICD-10-CM | POA: Diagnosis not present

## 2023-12-25 DIAGNOSIS — A419 Sepsis, unspecified organism: Secondary | ICD-10-CM | POA: Diagnosis not present

## 2023-12-25 DIAGNOSIS — Z7189 Other specified counseling: Secondary | ICD-10-CM

## 2023-12-25 DIAGNOSIS — G9341 Metabolic encephalopathy: Secondary | ICD-10-CM | POA: Diagnosis not present

## 2023-12-25 DIAGNOSIS — A4151 Sepsis due to Escherichia coli [E. coli]: Secondary | ICD-10-CM | POA: Diagnosis not present

## 2023-12-25 DIAGNOSIS — Z515 Encounter for palliative care: Secondary | ICD-10-CM | POA: Diagnosis not present

## 2023-12-25 LAB — BASIC METABOLIC PANEL
Anion gap: 6 (ref 5–15)
BUN: 11 mg/dL (ref 8–23)
CO2: 23 mmol/L (ref 22–32)
Calcium: 8.6 mg/dL — ABNORMAL LOW (ref 8.9–10.3)
Chloride: 110 mmol/L (ref 98–111)
Creatinine, Ser: 0.78 mg/dL (ref 0.44–1.00)
GFR, Estimated: >60 mL/min (ref >60)
Glucose, Bld: 83 mg/dL (ref 70–99)
Potassium: 3.3 mmol/L — ABNORMAL LOW (ref 3.5–5.1)
Sodium: 139 mmol/L (ref 135–145)

## 2023-12-25 LAB — MAGNESIUM: Magnesium: 2.2 mg/dL (ref 1.7–2.4)

## 2023-12-25 MED ORDER — POTASSIUM CHLORIDE CRYS ER 20 MEQ PO TBCR
40.0000 meq | EXTENDED_RELEASE_TABLET | Freq: Once | ORAL | Status: AC
  Administered 2023-12-25: 40 meq via ORAL
  Filled 2023-12-25: qty 2

## 2023-12-25 NOTE — Plan of Care (Signed)
  Problem: Education: Goal: Knowledge of General Education information will improve Description: Including pain rating scale, medication(s)/side effects and non-pharmacologic comfort measures Outcome: Progressing   Problem: Activity: Goal: Risk for activity intolerance will decrease Outcome: Progressing   Problem: Nutrition: Goal: Adequate nutrition will be maintained Outcome: Progressing   Problem: Coping: Goal: Level of anxiety will decrease Outcome: Progressing   Problem: Elimination: Goal: Will not experience complications related to bowel motility Outcome: Progressing Goal: Will not experience complications related to urinary retention Outcome: Progressing   Problem: Pain Management: Goal: General experience of comfort will improve Outcome: Progressing   Problem: Safety: Goal: Ability to remain free from injury will improve Outcome: Progressing   Problem: Skin Integrity: Goal: Risk for impaired skin integrity will decrease Outcome: Progressing

## 2023-12-25 NOTE — Progress Notes (Addendum)
 PROGRESS NOTE    Eileen Wilkerson  FMW:[id number] DOB: 1932/03/13 DOA: 12/21/2023 PCP: Mast, [name], NP   Brief Narrative:  89 year old female with history of rheumatoid arthritis on chronic steroids, chronic adrenal insufficiency, chronic diastolic heart failure, CKD stage IIIa, fibromyalgia, hypertension, asthma and other comorbidities presented with fever, confusion and was found to be hypotensive, tachycardic in the ED.  She was started on IV fluids, blood pressure remained low and she was started on IV pressors along with broad-spectrum antibiotics and fluids.SABRA  COVID/influenza/RSV PCR negative.  Lactate was 2.7 and 2.1.  CT was negative for pulmonary embolism but showed multiple solid noncalcified lung nodules, no acute inflammatory process in the abdomen or pelvis but showed possible ovarian cyst.  She was transferred to [facility] service from 12/23/2023 onwards.  PT recommended SNF placement.  Assessment & Plan:   Septic shock: Present on admission, source currently unclear Lactic acidosis -Presented in septic shock and was admitted to ICU under PCCM service requiring broad-spectrum antibiotics, IV fluids and pressors.  Currently off of pressors.  Blood pressure has improved.  COVID/influenza/RSV PCR negative.  Respiratory panel PCR negative.  Blood cultures have been negative so far. -Currently on broad-spectrum antibiotics.  Vancomycin  has been discontinued.  Today is day #5 of antibiotics.  DC antibiotics after today's doses. -No clear-cut source of septic shock identified: Might have had some sort of viral gastroenteritis complicated by underlying adrenal insufficiency.  No diarrhea since admission.  DC'd stool testing and isolation -Currently afebrile and hemodynamically stable.  Sepsis has resolved.  History of adrenal insufficiency History of rheumatoid arthritis -Initially on IV hydrocortisone .  Switched back to prednisone  5 mg daily from 12/24/2023 onwards.  Outpatient follow-up with  endocrinology and rheumatology  Acute metabolic encephalopathy -Possibly from above.  Mental status has much improved.  Fall precautions.  PT recommending SNF placement.  TOC consulted.  Acute respiratory failure with hypoxia -Questionable cause.  Resolved.  Currently on room air.  Physical deconditioning -PT recommending SNF placement.  TOC following  AKI on CKD stage IIIa -Creatinine has normalized with IV fluids. -Monitor  Hypokalemia -Replace.  Repeat a.m. labs  Hypertension -Blood pressure improving.  Antihypertensives remain on hold for now.  Will resume antihypertensives if blood pressure remains elevated.  Nausea and vomiting -CT abdomen negative for acute inflammatory process.  Improved.  Currently tolerating regular diet.  Hypomagnesemia -Improved  Obesity -Outpatient follow-up  Ovarian mass as well as multiple solid noncalcified lung nodules -Imaging suspicious of possible ovarian cyst: Recommended outpatient follow-up with annual ultrasound evaluation.  Patient will need outpatient follow-up with pulmonary regarding follow-up of lung nodules, worrisome for malignancy  Goals of care -Patient is currently listed as full code.  Palliative care evaluation appreciated.  Remains full code.  DVT prophylaxis: Heparin  subcutaneous Code Status: Full Family Communication: None at bedside Disposition Plan: Status is: Inpatient Remains inpatient appropriate because: Of severity of illness.  Need for SNF placement.  Currently medically stable for discharge to SNF    Consultants: PCCM  Procedures: None  Antimicrobials:  Anti-infectives (From admission, onward)    Start     Dose/Rate Route Frequency Ordered Stop   12/23/23 1400  azithromycin  (ZITHROMAX ) tablet 500 mg        500 mg Oral Daily 12/23/23 0803 12/26/23 1359   12/23/23 1000  vancomycin  (VANCOREADY) IVPB 1250 mg/250 mL  Status:  Discontinued        1,250 mg 166.7 mL/hr over 90 Minutes Intravenous Every  48 hours 12/21/23 1523  12/22/23 0926   12/22/23 2000  ceFEPIme  (MAXIPIME ) 2 g in sodium chloride  0.9 % 100 mL IVPB        2 g 200 mL/hr over 30 Minutes Intravenous Every 12 hours 12/22/23 0920     12/22/23 1000  ceFEPIme  (MAXIPIME ) 2 g in sodium chloride  0.9 % 100 mL IVPB  Status:  Discontinued        2 g 200 mL/hr over 30 Minutes Intravenous Every 24 hours 12/21/23 1523 12/22/23 0920   12/21/23 1615  vancomycin  (VANCOCIN ) 500 mg in sodium chloride  0.9 % 100 mL IVPB        500 mg 100 mL/hr over 60 Minutes Intravenous  Once 12/21/23 1608 12/21/23 1712   12/21/23 1530  vancomycin  (VANCOREADY) IVPB 500 mg/100 mL  Status:  Discontinued        500 mg 100 mL/hr over 60 Minutes Intravenous  Once 12/21/23 1518 12/21/23 1608   12/21/23 1500  azithromycin  (ZITHROMAX ) 500 mg in sodium chloride  0.9 % 250 mL IVPB  Status:  Discontinued        500 mg 250 mL/hr over 60 Minutes Intravenous Every 24 hours 12/21/23 1414 12/23/23 0803   12/21/23 1045  vancomycin  (VANCOCIN ) IVPB 1000 mg/200 mL premix        1,000 mg 200 mL/hr over 60 Minutes Intravenous  Once 12/21/23 1032 12/21/23 2051   12/21/23 0845  ceFEPIme  (MAXIPIME ) 2 g in sodium chloride  0.9 % 100 mL IVPB        2 g 200 mL/hr over 30 Minutes Intravenous  Once 12/21/23 0836 12/21/23 0957   12/21/23 0845  metroNIDAZOLE  (FLAGYL ) IVPB 500 mg        500 mg 100 mL/hr over 60 Minutes Intravenous  Once 12/21/23 0836 12/21/23 1025        Subjective: Patient seen and examined at bedside.  No fever, vomiting, worsening shortness of breath reported.   Objective: Vitals:   12/24/23 1445 12/24/23 2032 12/25/23 0339 12/25/23 0344  BP: (!) 145/66 130/63 (!) 147/62   Pulse: 73 62 61   Resp: 14 20 16    Temp: 97.8 F (36.6 C) 98.4 F (36.9 C) 98.4 F (36.9 C)   TempSrc:  Oral Oral   SpO2: 98% 96% 96%   Weight:    78 kg  Height:        Intake/Output Summary (Last 24 hours) at 12/25/2023 0817 Last data filed at 12/25/2023 0742 Gross per 24 hour   Intake 1360 ml  Output 1250 ml  Net 110 ml   Filed Weights   12/23/23 0432 12/24/23 0412 12/25/23 0344  Weight: 79.3 kg 81.1 kg 78 kg    Examination:  General: Remains on room air.  No acute distress.  Chronically ill and deconditioned looking.  Elderly female lying in bed. ENT/neck: No JVD elevation or palpable neck masses noted respiratory: Bilateral decreased breath sounds at bases with scattered crackles  CVS: Rate mostly controlled; S1 and S2 are heard abdominal: Soft, nontender, distended mildly; no organomegaly, bowel sounds normally heard  extremities: No clubbing; mild lower extremity edema present CNS: Still slow to respond.  Poor historian.  No focal neurologic deficit.  Able to move extremities Lymph: No obvious palpable lymphadenopathy Skin: No obvious rashes/lesions psych: Mostly flat affect.  Showing no signs of agitation currently.   Musculoskeletal: No obvious joint erythema/tenderness   Data Reviewed: I have personally reviewed following labs and imaging studies  CBC: Recent Labs  Lab 12/21/23 0900 12/21/23 1639 12/22/23 0338 12/24/23  0346  WBC 9.9 8.0 7.5 8.1  NEUTROABS 6.8  --   --  3.5  HGB 13.7 11.3* 10.6* 10.3*  HCT 44.7 34.8* 34.1* 32.8*  MCV 94.1 91.6 93.9 92.4  PLT 226 190 169 156   Basic Metabolic Panel: Recent Labs  Lab 12/21/23 0900 12/21/23 1639 12/22/23 0338 12/24/23 0346 12/25/23 0356  NA 141  --  143 140 139  K 3.2*  --  3.8 3.2* 3.3*  CL 107  --  111 113* 110  CO2 25  --  23 23 23   GLUCOSE 107*  --  117* 85 83  BUN 17  --  15 15 11   CREATININE 1.30* 1.07* 0.96 0.84 0.78  CALCIUM  8.8*  --  8.2* 8.8* 8.6*  MG  --  1.6*  --  2.3 2.2  PHOS  --  3.6  --   --   --    GFR: Estimated Creatinine Clearance: 48.2 mL/min (by C-G formula based on SCr of 0.78 mg/dL). Liver Function Tests: Recent Labs  Lab 12/21/23 0900 12/22/23 0338  AST 50* 45*  ALT 28 26  ALKPHOS 75 57  BILITOT 0.8 0.6  PROT 7.0 5.2*  ALBUMIN  3.3* 2.5*    Recent Labs  Lab 12/21/23 1639  LIPASE 20   Recent Labs  Lab 12/21/23 1130  AMMONIA 23   Coagulation Profile: Recent Labs  Lab 12/21/23 0900  INR 1.3*   Cardiac Enzymes: No results for input(s): CKTOTAL, CKMB, CKMBINDEX, TROPONINI in the last 168 hours. BNP (last 3 results) No results for input(s): PROBNP in the last 8760 hours. HbA1C: No results for input(s): HGBA1C in the last 72 hours. CBG: No results for input(s): GLUCAP in the last 168 hours. Lipid Profile: No results for input(s): CHOL, HDL, LDLCALC, TRIG, CHOLHDL, LDLDIRECT in the last 72 hours. Thyroid  Function Tests: No results for input(s): TSH, T4TOTAL, FREET4, T3FREE, THYROIDAB in the last 72 hours. Anemia Panel: No results for input(s): VITAMINB12, FOLATE, FERRITIN, TIBC, IRON, RETICCTPCT in the last 72 hours. Sepsis Labs: Recent Labs  Lab 12/21/23 0912 12/21/23 1140 12/21/23 1639 12/21/23 1832  PROCALCITON  --   --  4.28  --   LATICACIDVEN 2.7* 2.1* 1.0 1.0    Recent Results (from the past 240 hours)  Resp panel by RT-PCR (RSV, Flu A&B, Covid) Anterior Nasal Swab     Status: None   Collection Time: 12/21/23  8:35 AM   Specimen: Anterior Nasal Swab  Result Value Ref Range Status   SARS Coronavirus 2 by RT PCR NEGATIVE NEGATIVE Final    Comment: (NOTE) SARS-CoV-2 target nucleic acids are NOT DETECTED.  The SARS-CoV-2 RNA is generally detectable in upper respiratory specimens during the acute phase of infection. The lowest concentration of SARS-CoV-2 viral copies this assay can detect is 138 copies/mL. A negative result does not preclude SARS-Cov-2 infection and should not be used as the sole basis for treatment or other patient management decisions. A negative result may occur with  improper specimen collection/handling, submission of specimen other than nasopharyngeal swab, presence of viral mutation(s) within the areas targeted by this assay,  and inadequate number of viral copies(<138 copies/mL). A negative result must be combined with clinical observations, patient history, and epidemiological information. The expected result is Negative.  Fact Sheet for Patients:  bloggercourse.com  Fact Sheet for Healthcare Providers:  seriousbroker.it  This test is no t yet approved or cleared by the [place]  FDA and  has been authorized for detection and/or diagnosis  of SARS-CoV-2 by FDA under an Emergency Use Authorization (EUA). This EUA will remain  in effect (meaning this test can be used) for the duration of the COVID-19 declaration under Section 564(b)(1) of the Act, 21 U.S.C.section 360bbb-3(b)(1), unless the authorization is terminated  or revoked sooner.       Influenza A by PCR NEGATIVE NEGATIVE Final   Influenza B by PCR NEGATIVE NEGATIVE Final    Comment: (NOTE) The Xpert Xpress SARS-CoV-2/FLU/RSV plus assay is intended as an aid in the diagnosis of influenza from Nasopharyngeal swab specimens and should not be used as a sole basis for treatment. Nasal washings and aspirates are unacceptable for Xpert Xpress SARS-CoV-2/FLU/RSV testing.  Fact Sheet for Patients: bloggercourse.com  Fact Sheet for Healthcare Providers: seriousbroker.it  This test is not yet approved or cleared by the [place]  FDA and has been authorized for detection and/or diagnosis of SARS-CoV-2 by FDA under an Emergency Use Authorization (EUA). This EUA will remain in effect (meaning this test can be used) for the duration of the COVID-19 declaration under Section 564(b)(1) of the Act, 21 U.S.C. section 360bbb-3(b)(1), unless the authorization is terminated or revoked.     Resp Syncytial Virus by PCR NEGATIVE NEGATIVE Final    Comment: (NOTE) Fact Sheet for Patients: bloggercourse.com  Fact Sheet for  Healthcare Providers: seriousbroker.it  This test is not yet approved or cleared by the [place]  FDA and has been authorized for detection and/or diagnosis of SARS-CoV-2 by FDA under an Emergency Use Authorization (EUA). This EUA will remain in effect (meaning this test can be used) for the duration of the COVID-19 declaration under Section 564(b)(1) of the Act, 21 U.S.C. section 360bbb-3(b)(1), unless the authorization is terminated or revoked.  Performed at [facility], [address], [place], [place] [address]   Blood Culture (routine x 2)     Status: None (Preliminary result)   Collection Time: 12/21/23  9:00 AM   Specimen: BLOOD  Result Value Ref Range Status   Specimen Description   Final    BLOOD RIGHT ANTECUBITAL Performed at [facility], [address] [place], [place] [address]    Special Requests   Final    BOTTLES DRAWN AEROBIC AND ANAEROBIC Blood Culture results may not be optimal due to an inadequate volume of blood received in culture bottles Performed at [facility], [address] [place], [place] [address]    Culture   Final    NO GROWTH 4 DAYS Performed at [facility], [address] [place], [place] [address]    Report Status PENDING  Incomplete  Blood Culture (routine x 2)     Status: None (Preliminary result)   Collection Time: 12/21/23  9:50 AM   Specimen: BLOOD RIGHT WRIST  Result Value Ref Range Status   Specimen Description   Final    BLOOD RIGHT WRIST Performed at [facility], [address], [place], [place] [address]    Special Requests   Final    BOTTLES DRAWN AEROBIC AND ANAEROBIC Blood Culture results may not be optimal due to an inadequate volume of blood received in culture bottles Performed at [facility], [address], [place], [place] [address]    Culture   Final    NO GROWTH 4 DAYS Performed at [facility], [address], [place], [place] [address]    Report Status PENDING  Incomplete  Respiratory (~20 pathogens) panel by  PCR     Status: None   Collection Time: 12/21/23  4:48 PM   Specimen: Nasal Mucosa; Respiratory  Result Value Ref Range Status   Adenovirus NOT DETECTED NOT DETECTED Final   Coronavirus 229E NOT DETECTED NOT DETECTED Final    Comment: (NOTE) The Coronavirus on the Respiratory Panel, DOES NOT test for the novel  Coronavirus (2019 nCoV)    Coronavirus HKU1 NOT DETECTED NOT DETECTED Final   Coronavirus NL63 NOT DETECTED NOT DETECTED Final   Coronavirus OC43 NOT DETECTED NOT DETECTED Final   Metapneumovirus NOT DETECTED NOT DETECTED Final   Rhinovirus / Enterovirus NOT DETECTED NOT DETECTED Final   Influenza A NOT DETECTED NOT DETECTED Final   Influenza B NOT DETECTED NOT DETECTED Final   Parainfluenza Virus 1 NOT DETECTED NOT DETECTED Final   Parainfluenza Virus 2 NOT DETECTED NOT DETECTED Final   Parainfluenza Virus 3 NOT DETECTED NOT DETECTED Final   Parainfluenza Virus 4 NOT DETECTED NOT DETECTED Final   Respiratory Syncytial Virus NOT DETECTED NOT DETECTED Final   Bordetella pertussis NOT DETECTED NOT DETECTED Final   Bordetella Parapertussis NOT DETECTED NOT DETECTED Final   Chlamydophila pneumoniae NOT DETECTED NOT DETECTED Final   Mycoplasma pneumoniae NOT DETECTED NOT DETECTED Final    Comment: Performed at [facility], [address], [place], [place] [address]  MRSA Next Gen by PCR, Nasal     Status: None   Collection Time: 12/21/23  4:48 PM   Specimen: Nasal Mucosa; Nasal Swab  Result Value Ref Range Status   MRSA by PCR Next Gen NOT DETECTED NOT DETECTED Final    Comment: (NOTE) The GeneXpert MRSA Assay (FDA approved for NASAL specimens only), is one component of a comprehensive MRSA colonization surveillance program. It is not intended to diagnose MRSA infection nor to guide or monitor treatment for MRSA infections. Test performance is  not FDA approved in patients less than 70 years old. Performed at [facility], [address] [place], [place] [address]          Radiology Studies: No results found.       Scheduled Meds:  azithromycin   500 mg Oral Daily   heparin   5,000 Units Subcutaneous Q8H   predniSONE   5 mg Oral Q breakfast   Continuous Infusions:  ceFEPime  (MAXIPIME ) IV Stopped (12/24/23 2106)          Sophie Mao, MD [facility] 12/25/2023, 8:17 AM

## 2023-12-25 NOTE — Progress Notes (Signed)
 Physical Therapy Treatment Patient Details Name: MANJU KULKARNI MRN: [id number] DOB: 10-17-1932 Today's Date: 12/25/2023   History of Present Illness 89 year old female presented with fever, confusion and was found to be hypotensive, tachycardic and admitted 12/21/23 for Septic shock: Present on admission.  Past medical history of rheumatoid arthritis on chronic steroids, chronic adrenal insufficiency, chronic diastolic heart failure, CKD stage IIIa, fibromyalgia, hypertension, asthma, DVT, back surgeries and other comorbidities    PT Comments  The  patient reports that she is resting but is willing to get up and ambulate a short distance.  Patient should be able to progress further ambulation. Reported no pain in the right hip this visit.   Patient asking why she cannot return to her ALF where she ambulated in her  apartment independently and could go to bathroom. Right now, patient  will need 1:1 for mobility. Patient will benefit from continued inpatient follow up therapy, <3 hours/day.    If plan is discharge home, recommend the following: A little help with walking and/or transfers;A little help with bathing/dressing/bathroom;Assist for transportation;Assistance with cooking/housework   Can travel by private vehicle        Equipment Recommendations  None recommended by PT    Recommendations for Other Services       Precautions / Restrictions Precautions Precautions: Fall Precaution Comments: urinary incontinence Restrictions Weight Bearing Restrictions Per Provider Order: No     Mobility  Bed Mobility   Bed Mobility: Supine to Sit, Sit to Supine     Supine to sit: HOB elevated, Min assist Sit to supine: Min assist   General bed mobility comments: assist for trunk upright, increased time and effort, assist legs onto bed    Transfers     Transfers: Sit to/from Stand Sit to Stand: Min assist           General transfer comment: patient stepped forward and  backward x 5' x 2 then side stepped x 5'.  steady assist    Ambulation/Gait                   Stairs             Wheelchair Mobility     Tilt Bed    Modified Rankin (Stroke Patients Only)       Balance Overall balance assessment: Mild deficits observed, not formally tested, Needs assistance                                          Cognition Arousal: Alert Behavior During Therapy: WFL for tasks assessed/performed, Flat affect Overall Cognitive Status: Within Functional Limits for tasks assessed                                          Exercises General Exercises - Lower Extremity Long Arc Quad: AROM, 10 reps, Both    General Comments        Pertinent Vitals/Pain Pain Assessment Pain Score: 0-No pain Pain Location: right hip does not  pain today    Home Living                          Prior Function            PT Goals (current goals can  now be found in the care plan section) Progress towards PT goals: Progressing toward goals    Frequency    Min 1X/week      PT Plan      Co-evaluation              AM-PAC PT 6 Clicks Mobility   Outcome Measure  Help needed turning from your back to your side while in a flat bed without using bedrails?: A Little Help needed moving from lying on your back to sitting on the side of a flat bed without using bedrails?: A Little Help needed moving to and from a bed to a chair (including a wheelchair)?: A Little Help needed standing up from a chair using your arms (e.g., wheelchair or bedside chair)?: A Little Help needed to walk in hospital room?: A Little Help needed climbing 3-5 steps with a railing? : A Lot 6 Click Score: 17    End of Session   Activity Tolerance: Patient tolerated treatment well;Patient limited by fatigue Patient left: in bed;with call bell/phone within reach;with bed alarm set Nurse Communication: Mobility status PT Visit  Diagnosis: Difficulty in walking, not elsewhere classified (R26.2);Unsteadiness on feet (R26.81)     Time: 1445-1500 PT Time Calculation (min) (ACUTE ONLY): 15 min  Charges:    $Gait Training: 8-22 mins PT General Charges $$ ACUTE PT VISIT: 1 Visit                     Darice Potters PT Acute Rehabilitation Services Office [phone] Weekend [phone]    Potters Darice Norris 12/25/2023, 3:10 PM

## 2023-12-25 NOTE — TOC Initial Note (Signed)
 Transition of Care [facility]) - Initial/Assessment Note    Patient Details  Name: Eileen Wilkerson MRN: [id number] Date of Birth: 1931-12-29  Transition of Care [facility]) CM/SW Contact:    Tawni CHRISTELLA Eva, LCSW Phone Number: 12/25/2023, 10:47 AM  Clinical Narrative:                 CSW spoke with the pt regarding SNF recommendations, and she has agreed to placement. Pt would like to return to [facility]. CSW explained the process, noting that insurance authorization will be required.  CSW spoke with Izetta at [facility], and she reported having a bed available for the pt. CSW will start the insurance authorization process. TOC to follow  Expected Discharge Plan: Skilled Nursing Facility Barriers to Discharge: Insurance Authorization   Patient Goals and CMS Choice Patient states their goals for this hospitalization and ongoing recovery are:: SNf to get stonger          Expected Discharge Plan and Services       Living arrangements for the past 2 months: Apartment                                      Prior Living Arrangements/Services Living arrangements for the past 2 months: Apartment Lives with:: Self          Need for Family Participation in Patient Care: No (Comment) Care giver support system in place?: No (comment)      Activities of Daily Living   ADL Screening (condition at time of admission) Independently performs ADLs?: No Does the patient have a NEW difficulty with bathing/dressing/toileting/self-feeding that is expected to last >3 days?: No Does the patient have a NEW difficulty with getting in/out of bed, walking, or climbing stairs that is expected to last >3 days?: No Does the patient have a NEW difficulty with communication that is expected to last >3 days?: No Is the patient deaf or have difficulty hearing?: Yes Does the patient have difficulty seeing, even when wearing glasses/contacts?: No Does the patient have difficulty  concentrating, remembering, or making decisions?: No  Permission Sought/Granted                  Emotional Assessment Appearance:: Appears stated age Attitude/Demeanor/Rapport: Gracious, Engaged Affect (typically observed): Accepting Orientation: : Oriented to Self, Oriented to Place, Oriented to  Time, Oriented to Situation   Psych Involvement: No (comment)  Admission diagnosis:  Hypoxia [R09.02] AKI (acute kidney injury) (HCC) [N17.9] Sepsis (HCC) [A41.9] Sepsis, due to unspecified organism, unspecified whether acute organ dysfunction present (HCC) [A41.9] Nausea and vomiting, unspecified vomiting type [R11.2] Patient Active Problem List   Diagnosis Date Noted   Acute gastritis 12/21/2023   Prediabetes 12/21/2023   History of diverticulitis 12/21/2023   Deep tissue injury 09/06/2023   HTN (hypertension) 08/07/2023   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   Acute metabolic encephalopathy 02/12/2023   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 02/12/2023   Urinary frequency 02/08/2023   Left hip pain 02/08/2023   Chronic diastolic heart failure (HCC) 11/06/2022   Gout 11/06/2022   Sepsis secondary to UTI (HCC) 10/27/2022   History of DVT (deep vein thrombosis) 05/26/2022   E coli bacteremia    Sepsis (HCC) 05/21/2022   Diarrhea 05/21/2022   Gait abnormality 01/26/2022   Sinus tachycardia 10/27/2021   Weight gain 10/14/2021   Hyperlipidemia 10/13/2021   Dysuria 06/23/2021  Osteopenia 06/07/2021   Adnexal cyst 03/03/2021   AK (actinic keratosis) 03/03/2021   UTI (urinary tract infection) 07/22/2020   Allergic rhinitis 07/22/2020   Asthma 07/08/2020   Perforation of sigmoid colon due to diverticulitis 06/23/2020   Hair loss 08/06/2019   SOB (shortness of breath) on exertion 08/06/2019   Pure hypercholesterolemia 08/06/2019   B12 deficiency 03/11/2018   CKD (chronic kidney disease) stage 3, GFR 30-59 ml/min (HCC) 02/14/2018   E.  coli UTI 01/18/2018   Edema of both lower extremities due to peripheral venous insufficiency 09/18/2017   Bilateral lower extremity edema 06/30/2017   Physical deconditioning 05/30/2017   Immunosuppressed status (HCC) 05/30/2017   Abnormal chest x-ray 05/17/2017   Hypokalemia 04/25/2017   Fibromyalgia 12/18/2016   Spondylosis of lumbar region without myelopathy or radiculopathy 12/18/2016   Trigger finger, right middle finger 12/18/2016   Primary osteoarthritis of both feet 12/18/2016   Primary osteoarthritis of both knees 12/18/2016   GERD (gastroesophageal reflux disease) 12/18/2016   Age-related osteoporosis without current pathological fracture 12/18/2016   Rheumatoid arthritis (HCC) 12/28/2015   Long term current use of systemic steroids, for RA 12/28/2015   Lymphocytosis 11/13/2012   PCP:  Mast, [name], NP Pharmacy:   [facility] [address] [place], [place] - [address] [place] [address] Phone: [phone] Fax: [phone]  [facility] [facility] [place], [place] - [address] [place] [address] Phone: [phone] Fax: [phone]  [id number] [place], [place] - [address] [place] [address] Phone: [phone] Fax: [phone]     Social Drivers of Health (SDOH) Social History: SDOH Screenings   Food Insecurity: No Food Insecurity (12/21/2023)  Housing: Low Risk  (12/21/2023)  Transportation Needs: No Transportation Needs (12/21/2023)  Utilities: Not At Risk (12/21/2023)  Depression (PHQ2-9): Low Risk  (11/05/2023)  Tobacco Use: Medium Risk (12/24/2023)   SDOH Interventions:     Readmission Risk Interventions    05/30/2022   10:08 AM  Readmission Risk Prevention Plan  Transportation Screening Complete  PCP or Specialist Appt within 5-7 Days Complete  Home Care Screening Complete  Medication Review (RN CM) Complete

## 2023-12-25 NOTE — Progress Notes (Addendum)
 Daily Progress Note   Patient Name: Eileen Wilkerson       Date: 12/25/2023 DOB: [birth year]  Age: 89 y.o. MRN#: [id number] Attending Physician: Cheryle Page, MD Primary Care Physician: Mast, [name], NP Admit Date: 12/21/2023 Length of Stay: 4 days  Reason for Consultation/Follow-up: Establishing goals of care  Subjective:   CC: Patient notes she is very tired and so doesn't want to engage in discussion. Following up regarding complex medical decision making.   Subjective:  Extensive review of EMR prior to presenting to bedside.   When presenting to bedside, patient laying comfortably in bed sleeping. Patient's daughter, Eileen Wilkerson, present at bedside. Able to introduce myself as a member of the palliative medicine team. Patient did confirm what her ACP documents on file state; her daughter, Eileen Wilkerson, is her HCPOA. Tried to engage in conversation about code status though patient noted she felt incredibly sleepy at this time and so didn't want to engage in difficult conversation. Acknowledged this.  Daughter is an [employer] noted that her mother and her have not engaged in code status conversation recently. She did not that they had talked about it previously when patient had multiple admissions last year. Daughter would appreciate continued outpatient palliative support to continue discussions and planning moving forward. Noted would [facility] consult for referral.  Daughter also had multiple questions regarding current medical management and discharge planning for rehab vs return to ALF with PT/OT. Also inquiring about patient's worsening lethargy. Answered questions as able though noted would reach out to [facility] and hospitalist regarding this matters since daughter reports not hearing from the care team.    Spent time answering questions as able. Thanked daughter and patient for allowing me to meet with them today.   Updated IDT including PT, hospitalist, TOC, and RN.   Objective:   Vital  Signs:  BP (!) 147/62 (BP Location: Right Arm)   Pulse 61   Temp 98.4 F (36.9 C) (Oral)   Resp 16   Ht 5' 4 (1.626 m)   Wt 78 kg   SpO2 96%   BMI 29.52 kg/m   Physical Exam: General: NAD, lethargic Cardiovascular: RRR Respiratory: no increased work of breathing noted, not in respiratory distress  Imaging: I personally reviewed recent imaging.   Assessment & Plan:   Assessment: Patient is an 89 year old female with a past medical history of rheumatoid arthritis on chronic steroids, chronic adrenal insufficiency, HFpEF CKD, fibromyalgia, hypertension, and asthma who was admitted on 12/31/2023 for management of sepsis with unknown source.  Patient has received management for septic shock, adrenal insufficiency, and AKI.  Palliative medicine team consulted to assist with complex medical decision making.  Recommendations/Plan: # Complex medical decision making/goals of care:  - Patient continuing appropriate medical interventions at this time with hope to regain strength and eventually return to her ALF. TOC assisting with discharge planning.  -Patient has ACP documentation on file naming her daughter, Eileen Wilkerson, as her HCPOA if she is unable to participate in medical decision making herself. Her documents do state that I desire that my life not be prolonged by life-sustaining procedures if I am terminally ill, permanently in a coma, suffer dementia, or am in a persistent vegetative state.   -Daughter requesting outpatient palliative medicine referral to continue conversations. Placed consult for TOC to assist with this.   -  Code Status: Full Code   -Tried to discuss code status today though patient noted she was too lethargic and did not want to engage in  this conversation.   # Psychosocial Support:  -Daughter- Pharmacologist)  # Discharge Planning: rehab at SNF or ALF with PT/OT  Discussed with: patient, patient's daughter, TOC, RN, hospitalist, PT  Thank you for  allowing the palliative care team to participate in the care Eileen Wilkerson.  Tinnie Radar, DO Palliative Care Provider PMT # [phone]  If patient remains symptomatic despite maximum doses, please call PMT at [phone] between 0700 and 1900. Outside of these hours, please call attending, as PMT does not have night coverage.  Personally spent 37 minutes in patient care including extensive chart review (labs, imaging, progress/consult notes, vital signs), medically appropraite exam, discussed with treatment team, education to patient, family, and staff, documenting clinical information, medication review and management, coordination of care, and available advanced directive documents.   *Please note that this is a verbal dictation therefore any spelling or grammatical errors are due to the Dragon Medical One system interpretation.

## 2023-12-25 NOTE — NC FL2 (Signed)
   MEDICAID [id number] LEVEL OF CARE FORM     IDENTIFICATION  Patient Name: Eileen Wilkerson Birthdate: [birth year] Sex: female Admission Date (Current Location): 12/21/2023  [facility] and [place] Number:  Producer, Television/film/video and Address:  [facility],  [address] [place], [place] [address]      Provider Number: [id number]  Attending Physician Name and Address:  Cheryle Page, MD  Relative Name and Phone Number:  Ranae Quale (Daughter)  [phone] (Mobile)    Current Level of Care: Hospital Recommended Level of Care: Skilled Nursing Facility Prior Approval Number:    Date Approved/Denied:   PASRR Number: [id number]  Discharge Plan: SNF    Current Diagnoses: Patient Active Problem List   Diagnosis Date Noted   Acute gastritis 12/21/2023   Prediabetes 12/21/2023   History of diverticulitis 12/21/2023   Deep tissue injury 09/06/2023   HTN (hypertension) 08/07/2023   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   Acute metabolic encephalopathy 02/12/2023   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 02/12/2023   Urinary frequency 02/08/2023   Left hip pain 02/08/2023   Chronic diastolic heart failure (HCC) 11/06/2022   Gout 11/06/2022   Sepsis secondary to UTI (HCC) 10/27/2022   History of DVT (deep vein thrombosis) 05/26/2022   E coli bacteremia    Sepsis (HCC) 05/21/2022   Diarrhea 05/21/2022   Gait abnormality 01/26/2022   Sinus tachycardia 10/27/2021   Weight gain 10/14/2021   Hyperlipidemia 10/13/2021   Dysuria 06/23/2021   Osteopenia 06/07/2021   Adnexal cyst 03/03/2021   AK (actinic keratosis) 03/03/2021   UTI (urinary tract infection) 07/22/2020   Allergic rhinitis 07/22/2020   Asthma 07/08/2020   Perforation of sigmoid colon due to diverticulitis 06/23/2020   Hair loss 08/06/2019   SOB (shortness of breath) on exertion 08/06/2019   Pure hypercholesterolemia 08/06/2019   B12 deficiency 03/11/2018   CKD  (chronic kidney disease) stage 3, GFR 30-59 ml/min (HCC) 02/14/2018   E. coli UTI 01/18/2018   Edema of both lower extremities due to peripheral venous insufficiency 09/18/2017   Bilateral lower extremity edema 06/30/2017   Physical deconditioning 05/30/2017   Immunosuppressed status (HCC) 05/30/2017   Abnormal chest x-ray 05/17/2017   Hypokalemia 04/25/2017   Fibromyalgia 12/18/2016   Spondylosis of lumbar region without myelopathy or radiculopathy 12/18/2016   Trigger finger, right middle finger 12/18/2016   Primary osteoarthritis of both feet 12/18/2016   Primary osteoarthritis of both knees 12/18/2016   GERD (gastroesophageal reflux disease) 12/18/2016   Age-related osteoporosis without current pathological fracture 12/18/2016   Rheumatoid arthritis (HCC) 12/28/2015   Long term current use of systemic steroids, for RA 12/28/2015   Lymphocytosis 11/13/2012    Orientation RESPIRATION BLADDER Height & Weight     Self, Time, Situation, Place  Normal Incontinent Weight: 171 lb 15.3 oz (78 kg) Height:  5' 4 (162.6 cm)  BEHAVIORAL SYMPTOMS/MOOD NEUROLOGICAL BOWEL NUTRITION STATUS      Continent Diet (regular)  AMBULATORY STATUS COMMUNICATION OF NEEDS Skin   Limited Assist Verbally PU Stage and Appropriate Care (sacrum)   PU Stage 2 Dressing:  (PRN)                   Personal Care Assistance Level of Assistance  Bathing, Feeding, Dressing Bathing Assistance: Limited assistance Feeding assistance: Independent Dressing Assistance: Limited assistance     Functional Limitations Info  Sight, Hearing, Speech Sight Info: Adequate Hearing Info: Adequate Speech Info: Adequate    SPECIAL CARE FACTORS FREQUENCY  PT (By licensed PT), OT (By licensed OT)     PT Frequency: 5 x a week OT Frequency: 5 x a week            Contractures Contractures Info: Not present    Additional Factors Info  Code Status, Allergies Code Status Info: full Allergies Info: Adhesive (Tape)   Celebrex (Celecoxib)  Ciprofibrate  Cymbalta (Duloxetine Hcl)  Gabitril (Tiagabine)  Lyrica (Pregabalin)  Neurontin (Gabapentin)  Nexium  (Esomeprazole )  Nsaids  Shrimp (Shellfish Allergy)  Azactam  (Aztreonam )  Azelastine  Hcl  Ciprofloxacin   Claritin  (Loratadine )  Methotrexate  Derivatives  Nasacort  (Triamcinolone )  Olopatadine   Other  Sulfamethizole  Zantac (Ranitidine Hcl)  Claritin -d 12 Hour (Loratadine -pseudoephedrine Er)  Keflex  (Cephalexin )  Penicillins  Sulfa Antibiotics           Current Medications (12/25/2023):  This is the current hospital active medication list Current Facility-Administered Medications  Medication Dose Route Frequency Provider Last Rate Last Admin   acetaminophen  (TYLENOL ) tablet 650 mg  650 mg Oral Q6H PRN Shelah Lamar RAMAN, MD   650 mg at 12/22/23 1640   azithromycin  (ZITHROMAX ) tablet 500 mg  500 mg Oral Daily Carolee Browning T, RPH   500 mg at 12/24/23 1351   ceFEPIme  (MAXIPIME ) 2 g in sodium chloride  0.9 % 100 mL IVPB  2 g Intravenous Q12H Shelah Lamar RAMAN, MD 200 mL/hr at 12/25/23 0842 2 g at 12/25/23 9157   docusate sodium  (COLACE) capsule 100 mg  100 mg Oral BID PRN Shelah Lamar RAMAN, MD       heparin  injection 5,000 Units  5,000 Units Subcutaneous Q8H Byrum, Robert S, MD   5,000 Units at 12/25/23 0618   lip balm (CARMEX) ointment 1 Application  1 Application Topical PRN Byrum, Robert S, MD       Oral care mouth rinse  15 mL Mouth Rinse PRN Cheryle, Kshitiz, MD       polyethylene glycol (MIRALAX  / GLYCOLAX ) packet 17 g  17 g Oral Daily PRN Shelah Lamar RAMAN, MD       predniSONE  (DELTASONE ) tablet 5 mg  5 mg Oral Q breakfast Cheryle Page, MD   5 mg at 12/25/23 9163     Discharge Medications: Please see discharge summary for a list of discharge medications.  Relevant Imaging Results:  Relevant Lab Results:   Additional Information SSN:[id number]  Tawni HERO Leeana Creer, LCSW

## 2023-12-25 NOTE — TOC Progression Note (Signed)
 Transition of Care [facility]) - Progression Note    Patient Details  Name: Eileen Wilkerson MRN: [id number] Date of Birth: 1932/01/22  [facility]) CM/SW Contact  Tawni CHRISTELLA Eva, LCSW Phone Number: 12/25/2023, 1:34 PM  Clinical Narrative:    CSW spoke with pt's daughter [name] and provided an update. Pt's insurance authorization is pending. TOC to follow.     Expected Discharge Plan: Skilled Nursing Facility Barriers to Discharge: Insurance Authorization  Expected Discharge Plan and Services       Living arrangements for the past 2 months: Apartment                                       Social Determinants of Health (SDOH) Interventions SDOH Screenings   Food Insecurity: No Food Insecurity (12/21/2023)  Housing: Low Risk  (12/21/2023)  Transportation Needs: No Transportation Needs (12/21/2023)  Utilities: Not At Risk (12/21/2023)  Depression (PHQ2-9): Low Risk  (11/05/2023)  Tobacco Use: Medium Risk (12/24/2023)    Readmission Risk Interventions    05/30/2022   10:08 AM  Readmission Risk Prevention Plan  Transportation Screening Complete  PCP or Specialist Appt within 5-7 Days Complete  Home Care Screening Complete  Medication Review (RN CM) Complete

## 2023-12-26 ENCOUNTER — Non-Acute Institutional Stay (SKILLED_NURSING_FACILITY): Payer: Self-pay | Admitting: Adult Health

## 2023-12-26 ENCOUNTER — Encounter: Payer: Self-pay | Admitting: Adult Health

## 2023-12-26 DIAGNOSIS — G9341 Metabolic encephalopathy: Secondary | ICD-10-CM | POA: Diagnosis not present

## 2023-12-26 DIAGNOSIS — I10 Essential (primary) hypertension: Secondary | ICD-10-CM

## 2023-12-26 DIAGNOSIS — N83202 Unspecified ovarian cyst, left side: Secondary | ICD-10-CM | POA: Diagnosis not present

## 2023-12-26 DIAGNOSIS — E876 Hypokalemia: Secondary | ICD-10-CM | POA: Diagnosis not present

## 2023-12-26 DIAGNOSIS — R5381 Other malaise: Secondary | ICD-10-CM | POA: Diagnosis not present

## 2023-12-26 DIAGNOSIS — I5032 Chronic diastolic (congestive) heart failure: Secondary | ICD-10-CM | POA: Diagnosis not present

## 2023-12-26 DIAGNOSIS — R6521 Severe sepsis with septic shock: Secondary | ICD-10-CM

## 2023-12-26 DIAGNOSIS — M25571 Pain in right ankle and joints of right foot: Secondary | ICD-10-CM | POA: Diagnosis not present

## 2023-12-26 DIAGNOSIS — M0579 Rheumatoid arthritis with rheumatoid factor of multiple sites without organ or systems involvement: Secondary | ICD-10-CM | POA: Diagnosis not present

## 2023-12-26 DIAGNOSIS — R918 Other nonspecific abnormal finding of lung field: Secondary | ICD-10-CM | POA: Diagnosis not present

## 2023-12-26 DIAGNOSIS — E669 Obesity, unspecified: Secondary | ICD-10-CM | POA: Diagnosis not present

## 2023-12-26 DIAGNOSIS — N1831 Chronic kidney disease, stage 3a: Secondary | ICD-10-CM | POA: Diagnosis not present

## 2023-12-26 DIAGNOSIS — R531 Weakness: Secondary | ICD-10-CM | POA: Diagnosis not present

## 2023-12-26 DIAGNOSIS — E2740 Unspecified adrenocortical insufficiency: Secondary | ICD-10-CM

## 2023-12-26 DIAGNOSIS — N949 Unspecified condition associated with female genital organs and menstrual cycle: Secondary | ICD-10-CM | POA: Diagnosis not present

## 2023-12-26 DIAGNOSIS — M1A9XX0 Chronic gout, unspecified, without tophus (tophi): Secondary | ICD-10-CM | POA: Diagnosis not present

## 2023-12-26 DIAGNOSIS — A419 Sepsis, unspecified organism: Secondary | ICD-10-CM

## 2023-12-26 DIAGNOSIS — R112 Nausea with vomiting, unspecified: Secondary | ICD-10-CM | POA: Diagnosis not present

## 2023-12-26 DIAGNOSIS — Z8639 Personal history of other endocrine, nutritional and metabolic disease: Secondary | ICD-10-CM | POA: Diagnosis not present

## 2023-12-26 LAB — CBC WITH DIFFERENTIAL/PLATELET
Abs Immature Granulocytes: 0.05 10*3/uL (ref 0.00–0.07)
Basophils Absolute: 0 10*3/uL (ref 0.0–0.1)
Basophils Relative: 0 %
Eosinophils Absolute: 0.2 10*3/uL (ref 0.0–0.5)
Eosinophils Relative: 2 %
HCT: 37.1 % (ref 36.0–46.0)
Hemoglobin: 11.6 g/dL — ABNORMAL LOW (ref 12.0–15.0)
Immature Granulocytes: 1 %
Lymphocytes Relative: 42 %
Lymphs Abs: 4.2 10*3/uL — ABNORMAL HIGH (ref 0.7–4.0)
MCH: 29 pg (ref 26.0–34.0)
MCHC: 31.3 g/dL (ref 30.0–36.0)
MCV: 92.8 fL (ref 80.0–100.0)
Monocytes Absolute: 0.9 10*3/uL (ref 0.1–1.0)
Monocytes Relative: 9 %
Neutro Abs: 4.7 10*3/uL (ref 1.7–7.7)
Neutrophils Relative %: 46 %
Platelets: 188 10*3/uL (ref 150–400)
RBC: 4 MIL/uL (ref 3.87–5.11)
RDW: 16.1 % — ABNORMAL HIGH (ref 11.5–15.5)
WBC: 10 10*3/uL (ref 4.0–10.5)
nRBC: 0 % (ref 0.0–0.2)

## 2023-12-26 LAB — COMPREHENSIVE METABOLIC PANEL
ALT: 57 U/L — ABNORMAL HIGH (ref 0–44)
AST: 52 U/L — ABNORMAL HIGH (ref 15–41)
Albumin: 2.8 g/dL — ABNORMAL LOW (ref 3.5–5.0)
Alkaline Phosphatase: 61 U/L (ref 38–126)
Anion gap: 8 (ref 5–15)
BUN: 7 mg/dL — ABNORMAL LOW (ref 8–23)
CO2: 25 mmol/L (ref 22–32)
Calcium: 8.9 mg/dL (ref 8.9–10.3)
Chloride: 107 mmol/L (ref 98–111)
Creatinine, Ser: 0.77 mg/dL (ref 0.44–1.00)
GFR, Estimated: >60 mL/min (ref >60)
Glucose, Bld: 87 mg/dL (ref 70–99)
Potassium: 3.2 mmol/L — ABNORMAL LOW (ref 3.5–5.1)
Sodium: 140 mmol/L (ref 135–145)
Total Bilirubin: 0.8 mg/dL (ref 0.0–1.2)
Total Protein: 6.1 g/dL — ABNORMAL LOW (ref 6.5–8.1)

## 2023-12-26 LAB — CULTURE, BLOOD (ROUTINE X 2)
Culture: NO GROWTH
Culture: NO GROWTH

## 2023-12-26 LAB — MAGNESIUM: Magnesium: 2.1 mg/dL (ref 1.7–2.4)

## 2023-12-26 MED ORDER — POTASSIUM CHLORIDE CRYS ER 20 MEQ PO TBCR
40.0000 meq | EXTENDED_RELEASE_TABLET | Freq: Once | ORAL | Status: AC
  Administered 2023-12-26: 40 meq via ORAL
  Filled 2023-12-26: qty 2

## 2023-12-26 MED ORDER — TRAMADOL HCL 50 MG PO TABS: 50.0000 mg | ORAL_TABLET | Freq: Four times a day (QID) | ORAL | 0 refills | Status: DC | PRN

## 2023-12-26 NOTE — Discharge Summary (Signed)
 Physician Discharge Summary  Eileen Wilkerson NWG:[id number] DOB: 19-Apr-1932 DOA: 12/21/2023  PCP: Mast, [name], NP  Admit date: 12/21/2023 Discharge date: 12/26/2023  Admitted From: ALF Disposition:  SNF  Recommendations for Outpatient Follow-up:  Follow up with PCP in 1-2 weeks Please obtain BMP/CBC in one week  Home Health: none Equipment/Devices: none  Discharge Condition: stable CODE STATUS: Full code Diet Orders (From admission, onward)     Start     Ordered   12/23/23 0756  Diet regular Fluid consistency: Thin  Diet effective now       Question:  Fluid consistency:  Answer:  Thin   12/23/23 0756            HPI: Per admitting MD, 89 year old female w/ extensive medical hx as outlined below but most notably: RA on chronic steroids, adrenal insuff, HFpEF, CCD stage *, significant difficulty w/ ADLs at baseline. [facility]ides [facility]. Seen by NP at [facility] senior care on 1/9 w/ cc: N/V/D w/ associated abd pain. At time afebrile. No sick contacts. Felt to be gastritis. Provided w/ script for zofran , instructed to hold her diuretic and labs ordered. Pesented to ER 1/10 confused, febrile temp 103.3  HR 115, while in the ER got hypotensive SBP 80s, received 2 liters crystalloid, blood cultures sent, started on empiric abx and ultimately norepi to maintain MAP so Critical care asked to admit In ER initial dx eval  COVID-negative, influenza negative Serum creatinine 1.3 baseline around 1-1.1 white blood cell count 9.9 hemoglobin 13.7 INR 1.3 lactate 2.7, cleared to 2.1 post volume resuscitation urinalysis positive for protein.  CT obtained negative for pulmonary emboli, multiple solid noncalcified lung nodules, no acute inflammatory process in the abdomen or pelvis there is a 3.5 x 4.3 hypoattenuating structure in the left adnexa felt ovarian in etiology, also a tiny soft umbilical hernia  Hospital Course / Discharge diagnoses: Active Problems:   Sepsis [facility])   Palliative care  encounter   Goals of care, counseling/discussion   Counseling and coordination of care  Principal problem Septic shock, lactic acidosis-patient was admitted to the hospital with SIRS, hypotension requiring ICU admission and pressors, broad-spectrum IV fluids.  There was concern for septic shock, however source was not truly identified.  COVID/influenza/RSV PCR negative, RVP negative.  Blood cultures negative so far.  She did have some GI illness with nausea so suspect possible viral gastroenteritis, accompanied by hypotension in the setting of her known adrenal insufficiency.  She improved with IV antibiotics, pressors, fluids and eventually she was weaned off vasopressors and transferred to the hospitalist service.  She she was placed on an empiric antibiotic course which is now completed.  She is doing well, improved, no further nausea, vomiting, eating well.  No diarrhea.  She will be discharged in stable condition  Active problems History of adrenal insufficiency, history of rheumatoid arthritis -Initially on IV hydrocortisone .  Switched back to prednisone  5 mg daily from 12/24/2023 onwards.  Outpatient follow-up with endocrinology and rheumatology Acute metabolic encephalopathy -Possibly from above.  Mental status has much improved.  Fall precautions.  PT recommending SNF placement.  TOC consulted. Acute respiratory failure with hypoxia -Questionable cause.  Resolved.  Currently on room air. Physical deconditioning -PT recommending SNF placement. AKI on CKD stage IIIa -Creatinine has normalized with IV fluids. Hypokalemia -Replaced prior to discharge Hypertension -Blood pressure improving.  Antihypertensives initially held in the setting of trauma, now hypotensive.  Resume her low-dose amlodipine  Nausea and vomiting -CT  abdomen negative for acute inflammatory process.  Improved.  Currently tolerating regular diet. Hypomagnesemia -Improved Obesity -Outpatient follow-up Ovarian mass as well as  multiple solid noncalcified lung nodules -Imaging suspicious of possible ovarian cyst: Recommended outpatient follow-up with annual ultrasound evaluation.  Patient will need outpatient follow-up with pulmonary regarding follow-up of lung nodules, worrisome for malignancy Goals of care -Patient is currently listed as full code.  Palliative care evaluation appreciated.  Remains full code.    Discharge Instructions   Allergies as of 12/26/2023       Reactions   Adhesive [tape] Rash   Celebrex [celecoxib] Hives   Ciprofibrate Nausea Only   Cymbalta [duloxetine Hcl] Swelling   Gabitril [tiagabine] Swelling   Lyrica [pregabalin] Swelling   Neurontin [gabapentin] Swelling   Nexium  [esomeprazole ] Rash   Nsaids Rash   Shrimp [shellfish Allergy] Anaphylaxis   Per patient "shrimp only"   Azactam  [aztreonam ]    Hand swelling    Azelastine  Hcl    Rash    Ciprofloxacin  Other (See Comments)   dizziness   Claritin  [loratadine ]    Irritability Nervousness    Methotrexate  Derivatives    Nasacort  [triamcinolone ]    Dizzy    Olopatadine  Other (See Comments)   Pain and lethargy    Other    Sulfamethizole Other (See Comments)   unknown   Zantac [ranitidine Hcl] Other (See Comments)   unknown   Claritin -d 12 Hour [loratadine -pseudoephedrine Er] Anxiety   Keflex  [cephalexin ] Nausea And Vomiting   Tolerated Ancef    Penicillins Rash   Injection site reaction. Tolerated cefepime  in past. Also reports tolerating a penicillin infusion after this initial rxn ~20 yrs ago.  Has patient had a PCN reaction causing immediate rash, facial/tongue/throat swelling, SOB or lightheadedness with hypotension: No Has patient had a PCN reaction causing severe rash involving mucus membranes or skin necrosis: No Has patient had a PCN reaction that required hospitalization No Has patient had a PCN reaction occurring within the last 10 years: No   Sulfa Antibiotics Nausea And Vomiting        Medication List      TAKE these medications    acetaminophen  500 MG tablet Commonly known as: TYLENOL  Take 650 mg by mouth in the morning and at bedtime.   amLODipine  5 MG tablet Commonly known as: NORVASC  Take 5 mg by mouth daily.   atorvastatin  20 MG tablet Commonly known as: LIPITOR TAKE 1 TABLET BY MOUTH EVERY DAY   Biotin  5 MG Tbdp Take 1 tablet (5 mg total) by mouth daily.   colchicine  0.6 MG tablet TAKE 1 TABLET BY MOUTH EVERY DAY   CRANBERRY PO Take 1 capsule by mouth 2 (two) times daily.   cycloSPORINE  0.05 % ophthalmic emulsion Commonly known as: RESTASIS  Place 1 drop into both eyes 2 (two) times daily.   D-Mannose 500 MG Caps Take 500 mg by mouth daily.   diclofenac  Sodium 1 % Gel Commonly known as: VOLTAREN  APPLY 2 TO 4 GRAMS TOPICALLY TO AFFECTED JOINTS UP TO 4 TIMES DAILY   fexofenadine  180 MG tablet Commonly known as: ALLEGRA  Take 1 tablet (180 mg total) by mouth daily.   ipratropium-albuterol  0.5-2.5 (3) MG/3ML Soln Commonly known as: DUONEB Take 3 mLs by nebulization as needed.   lansoprazole  30 MG capsule Commonly known as: PREVACID  TAKE 1 CAPSULE BY MOUTH ONCE DAILY AT NOON What changed: See the new instructions.   montelukast  10 MG tablet Commonly known as: SINGULAIR  TAKE 1 TABLET BY MOUTH EVERYDAY AT BEDTIME  What changed: See the new instructions.   ondansetron  4 MG tablet Commonly known as: ZOFRAN  Take 4 mg by mouth as needed for nausea or vomiting.   potassium chloride  SA 20 MEQ tablet Commonly known as: KLOR-CON  M Take 40 mEq by mouth 2 (two) times daily.   predniSONE  5 MG tablet Commonly known as: DELTASONE  Take 5 mg by mouth daily with breakfast.   saccharomyces boulardii 250 MG capsule Commonly known as: FLORASTOR Take 250 mg by mouth 2 (two) times daily.   Torsemide 40 MG Tabs Take 40 mg by mouth daily.   traMADol  50 MG tablet Commonly known as: ULTRAM  Take 1 tablet (50 mg total) by mouth every 6 (six) hours as needed (pain).    Vitamin D3 125 MCG (5000 UT) Caps TAKE 1 CAPSULE BY MOUTH EVERY DAY       Consultations: PCCM  Procedures/Studies:  ECHOCARDIOGRAM COMPLETE Result Date: 12/21/2023    ECHOCARDIOGRAM REPORT   Patient Name:   Eileen Wilkerson Date of Exam: 12/21/2023 Medical Rec #:  [id number]          Height:       64.0 in Accession #:    [id number]         Weight:       162.0 lb Date of Birth:  23-Oct-1932          BSA:          1.789 m Patient Age:    89 years           BP:           97/58 mmHg Patient Gender: F                  HR:           76 bpm. Exam Location:  Inpatient Procedure: 2D Echo, Cardiac Doppler and Color Doppler STAT ECHO Indications:    Shock  History:        Patient has prior history of Echocardiogram examinations, most                 recent 05/22/2022. CHF, Arrythmias:Tachycardia,                 Signs/Symptoms:Shortness of Breath; Risk Factors:Dyslipidemia                 and Hypertension.  Sonographer:    Juanita Shaw Referring Phys: [id number] JONATHAN B DEWALD IMPRESSIONS  1. Left ventricular ejection fraction, by estimation, is 55 to 60%. The left ventricle has normal function. The left ventricle has no regional wall motion abnormalities. Left ventricular diastolic parameters are consistent with Grade I diastolic dysfunction (impaired relaxation).  2. Right ventricular systolic function is normal. The right ventricular size is normal. Tricuspid regurgitation signal is inadequate for assessing PA pressure.  3. The mitral valve is normal in structure. No evidence of mitral valve regurgitation. No evidence of mitral stenosis.  4. The aortic valve is tricuspid. There is mild calcification of the aortic valve. Aortic valve regurgitation is not visualized. No aortic stenosis is present.  5. The inferior vena cava is normal in size with greater than 50% respiratory variability, suggesting right atrial pressure of 3 mmHg. FINDINGS  Left Ventricle: Left ventricular ejection fraction, by estimation, is 55  to 60%. The left ventricle has normal function. The left ventricle has no regional wall motion abnormalities. The left ventricular internal cavity size was normal in size. There is  no left ventricular hypertrophy. Left ventricular  diastolic parameters are consistent with Grade I diastolic dysfunction (impaired relaxation). Right Ventricle: The right ventricular size is normal. No increase in right ventricular wall thickness. Right ventricular systolic function is normal. Tricuspid regurgitation signal is inadequate for assessing PA pressure. Left Atrium: Left atrial size was normal in size. Right Atrium: Right atrial size was normal in size. Pericardium: There is no evidence of pericardial effusion. Mitral Valve: The mitral valve is normal in structure. No evidence of mitral valve regurgitation. No evidence of mitral valve stenosis. MV peak gradient, 2.5 mmHg. The mean mitral valve gradient is 1.0 mmHg. Tricuspid Valve: The tricuspid valve is normal in structure. Tricuspid valve regurgitation is not demonstrated. Aortic Valve: The aortic valve is tricuspid. There is mild calcification of the aortic valve. Aortic valve regurgitation is not visualized. No aortic stenosis is present. Aortic valve mean gradient measures 6.0 mmHg. Aortic valve peak gradient measures 11.4 mmHg. Aortic valve area, by VTI measures 2.06 cm. Pulmonic Valve: The pulmonic valve was normal in structure. Pulmonic valve regurgitation is not visualized. Aorta: The aortic root is normal in size and structure. Venous: The inferior vena cava is normal in size with greater than 50% respiratory variability, suggesting right atrial pressure of 3 mmHg. IAS/Shunts: No atrial level shunt detected by color flow Doppler.  LEFT VENTRICLE PLAX 2D LVIDd:         3.90 cm      Diastology LVIDs:         3.00 cm      LV e' medial:    6.85 cm/s LV PW:         0.70 cm      LV E/e' medial:  9.8 LV IVS:        0.80 cm      LV e' lateral:   8.05 cm/s LVOT diam:      1.80 cm      LV E/e' lateral: 8.3 LV SV:         64 LV SV Index:   36 LVOT Area:     2.54 cm  LV Volumes (MOD) LV vol d, MOD A2C: 104.0 ml LV vol d, MOD A4C: 112.0 ml LV vol s, MOD A2C: 34.3 ml LV vol s, MOD A4C: 50.3 ml LV SV MOD A2C:     69.7 ml LV SV MOD A4C:     112.0 ml LV SV MOD BP:      67.4 ml RIGHT VENTRICLE             IVC RV Basal diam:  3.80 cm     IVC diam: 1.70 cm RV Mid diam:    3.70 cm RV S prime:     13.50 cm/s TAPSE (M-mode): 2.6 cm LEFT ATRIUM             Index        RIGHT ATRIUM          Index LA diam:        3.10 cm 1.73 cm/m   RA Area:     9.11 cm LA Vol (A2C):   22.4 ml 12.52 ml/m  RA Volume:   17.40 ml 9.73 ml/m LA Vol (A4C):   25.1 ml 14.03 ml/m LA Biplane Vol: 25.9 ml 14.48 ml/m  AORTIC VALVE                     PULMONIC VALVE AV Area (Vmax):    2.02 cm      PV Vmax:  0.78 m/s AV Area (Vmean):   1.51 cm      PV Peak grad:  2.4 mmHg AV Area (VTI):     2.06 cm AV Vmax:           169.00 cm/s AV Vmean:          119.000 cm/s AV VTI:            0.309 m AV Peak Grad:      11.4 mmHg AV Mean Grad:      6.0 mmHg LVOT Vmax:         134.00 cm/s LVOT Vmean:        70.800 cm/s LVOT VTI:          0.250 m LVOT/AV VTI ratio: 0.81  AORTA Ao Root diam: 2.80 cm Ao Asc diam:  2.90 cm MITRAL VALVE MV Area (PHT): 3.12 cm    SHUNTS MV Area VTI:   2.37 cm    Systemic VTI:  0.25 m MV Peak grad:  2.5 mmHg    Systemic Diam: 1.80 cm MV Mean grad:  1.0 mmHg MV Vmax:       0.79 m/s MV Vmean:      51.8 cm/s MV Decel Time: 243 msec MV E velocity: 67.00 cm/s MV A velocity: 62.90 cm/s MV E/A ratio:  1.07 Dalton McleanMD Electronically signed by Archer Bear Signature Date/Time: 12/21/2023/3:10:49 PM    Final    DG Hip Unilat W or Wo Pelvis 2-3 Views Left Result Date: 12/21/2023 CLINICAL DATA:  Left hip pain. EXAM: DG HIP (WITH OR WITHOUT PELVIS) 2-3V LEFT COMPARISON:  None Available. FINDINGS: There is no acute fracture or dislocation.Evaluation for fracture is limited due to osteopenia and body  habitus. There is moderate left and severe right hip osteoarthritic changes. There is near complete loss of right hip joint space with near bone on bone contact. Excreted contrast noted in the urinary bladder. The soft tissues are unremarkable. IMPRESSION: 1. No acute fracture or dislocation. 2. Moderate left and severe right hip osteoarthritis. Electronically Signed   By: Angus Bark M.D.   On: 12/21/2023 12:38   CT Angio Chest PE W and/or Wo Contrast Result Date: 12/21/2023 CLINICAL DATA:  Pulmonary embolism (PE) suspected, high prob; Epigastric pain sepsis. Nausea and lethargy. EXAM: CT ANGIOGRAPHY CHEST CT ABDOMEN AND PELVIS WITH CONTRAST TECHNIQUE: Multidetector CT imaging of the chest was performed using the standard protocol during bolus administration of intravenous contrast. Multiplanar CT image reconstructions and MIPs were obtained to evaluate the vascular anatomy. Multidetector CT imaging of the abdomen and pelvis was performed using the standard protocol during bolus administration of intravenous contrast. RADIATION DOSE REDUCTION: This exam was performed according to the departmental dose-optimization program which includes automated exposure control, adjustment of the mA and/or kV according to patient size and/or use of iterative reconstruction technique. CONTRAST:  75mL OMNIPAQUE  IOHEXOL  350 MG/ML SOLN COMPARISON:  CT scan abdomen and pelvis from 02/12/2023. FINDINGS: CTA CHEST FINDINGS Cardiovascular: No evidence of embolism to the proximal subsegmental pulmonary artery level. Normal cardiac size. No pericardial effusion. No aortic aneurysm. There are coronary artery calcifications, in keeping with coronary artery disease. Mediastinum/Nodes: Visualized thyroid  gland appears grossly unremarkable. No solid / cystic mediastinal masses. The esophagus is nondistended precluding optimal assessment. There are few mildly prominent mediastinal and hilar lymph nodes, which do not meet the size  criteria for lymphadenopathy. No axillary lymphadenopathy by size criteria. Lungs/Pleura: The central tracheo-bronchial tree is patent. There is mild, smooth, circumferential thickening  of the segmental and subsegmental bronchial walls, throughout bilateral lungs, which is nonspecific. Findings are most commonly seen with bronchitis or reactive airway disease, such as asthma. There are occlusive and nonocclusive filling defects in the distal segmental and subsegmental bronchi, throughout bilateral lungs, likely due to mucus/secretions or aspiration. There are multiple solid noncalcified nodules throughout bilateral lungs with largest in the anterior left upper lobe measuring up to 1.8 x 1.8 cm. This nodule exhibits small central cavitation. Rest of the nodules are smaller in size and do not exhibit cavitations. These are compatible with metastases. No consolidation, pleural effusion or pneumothorax. There are dependent changes in bilateral lungs. Musculoskeletal: The visualized soft tissues of the chest wall are grossly unremarkable. No suspicious osseous lesions. There are mild multilevel degenerative changes in the visualized spine. Review of the MIP images confirms the above findings. CT ABDOMEN and PELVIS FINDINGS Hepatobiliary: The liver is normal in size. Non-cirrhotic configuration. No suspicious mass. No intrahepatic or extrahepatic bile duct dilation. No calcified gallstones. Normal gallbladder wall thickness. No pericholecystic inflammatory changes. Pancreas: Unremarkable. No pancreatic ductal dilatation or surrounding inflammatory changes. Spleen: Within normal limits. No focal lesion. Adrenals/Urinary Tract: Adrenal glands are unremarkable. No suspicious renal mass. There is a 2.3 x 2.4 cm simple cyst in the left kidney interpolar region, anteriorly. There are additional several smaller cysts throughout bilateral kidneys. No hydronephrosis. No renal or ureteric calculi. Unremarkable urinary bladder.  Stomach/Bowel: There is a small sliding hiatal hernia. No disproportionate dilation of the small or large bowel loops. No evidence of abnormal bowel wall thickening or inflammatory changes. The appendix is unremarkable. There are scattered diverticula mainly in the sigmoid colon, without imaging signs of diverticulitis. Vascular/Lymphatic: No ascites or pneumoperitoneum. No abdominal or pelvic lymphadenopathy, by size criteria. No aneurysmal dilation of the major abdominal arteries. There are mild peripheral atherosclerotic vascular calcifications of the aorta and its major branches. Reproductive: The uterus is unremarkable. There is a 3.5 x 4.3 cm hypoattenuating structure in the left adnexa, most likely ovarian in etiology. The structure exhibits fluid attenuation and has no discrete mural nodule. Even though this is incompletely characterized on the current exam, favored to represent a senescent ovarian cyst. Annual follow-up ultrasound evaluation is recommended to documenting stability. Other: There is a tiny fat containing umbilical hernia. The soft tissues and abdominal wall are otherwise unremarkable. Musculoskeletal: No suspicious osseous lesions. There are moderate multilevel degenerative changes in the visualized spine. Review of the MIP images confirms the above findings. IMPRESSION: 1. No embolism to the proximal subsegmental pulmonary artery level. 2. There are multiple solid noncalcified lung nodules, compatible with metastases. 3. No acute inflammatory process identified within the abdomen or pelvis. 4. Multiple other nonacute observations, as described above. Electronically Signed   By: Beula Brunswick M.D.   On: 12/21/2023 11:18   CT ABDOMEN PELVIS W CONTRAST Result Date: 12/21/2023 CLINICAL DATA:  Pulmonary embolism (PE) suspected, high prob; Epigastric pain sepsis. Nausea and lethargy. EXAM: CT ANGIOGRAPHY CHEST CT ABDOMEN AND PELVIS WITH CONTRAST TECHNIQUE: Multidetector CT imaging of the chest  was performed using the standard protocol during bolus administration of intravenous contrast. Multiplanar CT image reconstructions and MIPs were obtained to evaluate the vascular anatomy. Multidetector CT imaging of the abdomen and pelvis was performed using the standard protocol during bolus administration of intravenous contrast. RADIATION DOSE REDUCTION: This exam was performed according to the departmental dose-optimization program which includes automated exposure control, adjustment of the mA and/or kV according to patient size and/or use  of iterative reconstruction technique. CONTRAST:  75mL OMNIPAQUE  IOHEXOL  350 MG/ML SOLN COMPARISON:  CT scan abdomen and pelvis from 02/12/2023. FINDINGS: CTA CHEST FINDINGS Cardiovascular: No evidence of embolism to the proximal subsegmental pulmonary artery level. Normal cardiac size. No pericardial effusion. No aortic aneurysm. There are coronary artery calcifications, in keeping with coronary artery disease. Mediastinum/Nodes: Visualized thyroid  gland appears grossly unremarkable. No solid / cystic mediastinal masses. The esophagus is nondistended precluding optimal assessment. There are few mildly prominent mediastinal and hilar lymph nodes, which do not meet the size criteria for lymphadenopathy. No axillary lymphadenopathy by size criteria. Lungs/Pleura: The central tracheo-bronchial tree is patent. There is mild, smooth, circumferential thickening of the segmental and subsegmental bronchial walls, throughout bilateral lungs, which is nonspecific. Findings are most commonly seen with bronchitis or reactive airway disease, such as asthma. There are occlusive and nonocclusive filling defects in the distal segmental and subsegmental bronchi, throughout bilateral lungs, likely due to mucus/secretions or aspiration. There are multiple solid noncalcified nodules throughout bilateral lungs with largest in the anterior left upper lobe measuring up to 1.8 x 1.8 cm. This nodule  exhibits small central cavitation. Rest of the nodules are smaller in size and do not exhibit cavitations. These are compatible with metastases. No consolidation, pleural effusion or pneumothorax. There are dependent changes in bilateral lungs. Musculoskeletal: The visualized soft tissues of the chest wall are grossly unremarkable. No suspicious osseous lesions. There are mild multilevel degenerative changes in the visualized spine. Review of the MIP images confirms the above findings. CT ABDOMEN and PELVIS FINDINGS Hepatobiliary: The liver is normal in size. Non-cirrhotic configuration. No suspicious mass. No intrahepatic or extrahepatic bile duct dilation. No calcified gallstones. Normal gallbladder wall thickness. No pericholecystic inflammatory changes. Pancreas: Unremarkable. No pancreatic ductal dilatation or surrounding inflammatory changes. Spleen: Within normal limits. No focal lesion. Adrenals/Urinary Tract: Adrenal glands are unremarkable. No suspicious renal mass. There is a 2.3 x 2.4 cm simple cyst in the left kidney interpolar region, anteriorly. There are additional several smaller cysts throughout bilateral kidneys. No hydronephrosis. No renal or ureteric calculi. Unremarkable urinary bladder. Stomach/Bowel: There is a small sliding hiatal hernia. No disproportionate dilation of the small or large bowel loops. No evidence of abnormal bowel wall thickening or inflammatory changes. The appendix is unremarkable. There are scattered diverticula mainly in the sigmoid colon, without imaging signs of diverticulitis. Vascular/Lymphatic: No ascites or pneumoperitoneum. No abdominal or pelvic lymphadenopathy, by size criteria. No aneurysmal dilation of the major abdominal arteries. There are mild peripheral atherosclerotic vascular calcifications of the aorta and its major branches. Reproductive: The uterus is unremarkable. There is a 3.5 x 4.3 cm hypoattenuating structure in the left adnexa, most likely  ovarian in etiology. The structure exhibits fluid attenuation and has no discrete mural nodule. Even though this is incompletely characterized on the current exam, favored to represent a senescent ovarian cyst. Annual follow-up ultrasound evaluation is recommended to documenting stability. Other: There is a tiny fat containing umbilical hernia. The soft tissues and abdominal wall are otherwise unremarkable. Musculoskeletal: No suspicious osseous lesions. There are moderate multilevel degenerative changes in the visualized spine. Review of the MIP images confirms the above findings. IMPRESSION: 1. No embolism to the proximal subsegmental pulmonary artery level. 2. There are multiple solid noncalcified lung nodules, compatible with metastases. 3. No acute inflammatory process identified within the abdomen or pelvis. 4. Multiple other nonacute observations, as described above. Electronically Signed   By: Beula Brunswick M.D.   On: 12/21/2023 11:18  DG Chest Port 1 View Result Date: 12/21/2023 CLINICAL DATA:  89 year old female with possible sepsis. EXAM: PORTABLE CHEST 1 VIEW COMPARISON:  Portable chest 02/12/2023 and earlier. FINDINGS: Portable AP semi upright view at 0844 hours. Low lung volumes not significantly changed from last year. Stable cardiac size and mediastinal contours. Visualized tracheal air column is within normal limits. Allowing for portable technique the lungs are clear. Chronic cervical ACDF. Stable visualized osseous structures. Negative visible bowel gas. IMPRESSION: Stable with chronically low lung volumes. No acute cardiopulmonary abnormality. Electronically Signed   By: Marlise Simpers M.D.   On: 12/21/2023 09:02     Subjective: - no chest pain, shortness of breath, no abdominal pain, nausea or vomiting.   Discharge Exam: BP (!) 152/71 (BP Location: Right Arm)   Pulse 81   Temp 98.4 F (36.9 C) (Oral)   Resp 18   Ht 5\' 4"  (1.626 m)   Wt 76.3 kg   SpO2 95%   BMI 28.87 kg/m    General: Pt is alert, awake, not in acute distress Cardiovascular: RRR, S1/S2 +, no rubs, no gallops Respiratory: CTA bilaterally, no wheezing, no rhonchi Abdominal: Soft, NT, ND, bowel sounds + Extremities: no edema, no cyanosis  The results of significant diagnostics from this hospitalization (including imaging, microbiology, ancillary and laboratory) are listed below for reference.     Microbiology: Recent Results (from the past 240 hours)  Resp panel by RT-PCR (RSV, Flu A&B, Covid) Anterior Nasal Swab     Status: None   Collection Time: 12/21/23  8:35 AM   Specimen: Anterior Nasal Swab  Result Value Ref Range Status   SARS Coronavirus 2 by RT PCR NEGATIVE NEGATIVE Final    Comment: (NOTE) SARS-CoV-2 target nucleic acids are NOT DETECTED.  The SARS-CoV-2 RNA is generally detectable in upper respiratory specimens during the acute phase of infection. The lowest concentration of SARS-CoV-2 viral copies this assay can detect is 138 copies/mL. A negative result does not preclude SARS-Cov-2 infection and should not be used as the sole basis for treatment or other patient management decisions. A negative result may occur with  improper specimen collection/handling, submission of specimen other than nasopharyngeal swab, presence of viral mutation(s) within the areas targeted by this assay, and inadequate number of viral copies(<138 copies/mL). A negative result must be combined with clinical observations, patient history, and epidemiological information. The expected result is Negative.  Fact Sheet for Patients:  BloggerCourse.com  Fact Sheet for Healthcare Providers:  SeriousBroker.it  This test is no t yet approved or cleared by the [place]  FDA and  has been authorized for detection and/or diagnosis of SARS-CoV-2 by FDA under an Emergency Use Authorization (EUA). This EUA will remain  in effect (meaning this test can be  used) for the duration of the COVID-19 declaration under Section 564(b)(1) of the Act, 21 U.S.C.section 360bbb-3(b)(1), unless the authorization is terminated  or revoked sooner.       Influenza A by PCR NEGATIVE NEGATIVE Final   Influenza B by PCR NEGATIVE NEGATIVE Final    Comment: (NOTE) The Xpert Xpress SARS-CoV-2/FLU/RSV plus assay is intended as an aid in the diagnosis of influenza from Nasopharyngeal swab specimens and should not be used as a sole basis for treatment. Nasal washings and aspirates are unacceptable for Xpert Xpress SARS-CoV-2/FLU/RSV testing.  Fact Sheet for Patients: BloggerCourse.com  Fact Sheet for Healthcare Providers: SeriousBroker.it  This test is not yet approved or cleared by the [place]  FDA and has been authorized for  detection and/or diagnosis of SARS-CoV-2 by FDA under an Emergency Use Authorization (EUA). This EUA will remain in effect (meaning this test can be used) for the duration of the COVID-19 declaration under Section 564(b)(1) of the Act, 21 U.S.C. section 360bbb-3(b)(1), unless the authorization is terminated or revoked.     Resp Syncytial Virus by PCR NEGATIVE NEGATIVE Final    Comment: (NOTE) Fact Sheet for Patients: BloggerCourse.com  Fact Sheet for Healthcare Providers: SeriousBroker.it  This test is not yet approved or cleared by the [place]  FDA and has been authorized for detection and/or diagnosis of SARS-CoV-2 by FDA under an Emergency Use Authorization (EUA). This EUA will remain in effect (meaning this test can be used) for the duration of the COVID-19 declaration under Section 564(b)(1) of the Act, 21 U.S.C. section 360bbb-3(b)(1), unless the authorization is terminated or revoked.  Performed at [facility], [address], [place], [place] [address]   Blood Culture (routine x 2)      Status: None   Collection Time: 12/21/23  9:00 AM   Specimen: BLOOD  Result Value Ref Range Status   Specimen Description   Final    BLOOD RIGHT ANTECUBITAL Performed at [facility], [address], [place], [place] [address]    Special Requests   Final    BOTTLES DRAWN AEROBIC AND ANAEROBIC Blood Culture results may not be optimal due to an inadequate volume of blood received in culture bottles Performed at [facility], [address], [place], [place] [address]    Culture   Final    NO GROWTH 5 DAYS Performed at [facility], [address], [place], [place] [address]    Report Status 12/26/2023 FINAL  Final  Blood Culture (routine x 2)     Status: None   Collection Time: 12/21/23  9:50 AM   Specimen: BLOOD RIGHT WRIST  Result Value Ref Range Status   Specimen Description   Final    BLOOD RIGHT WRIST Performed at [facility], [address] [place], [place] [address]    Special Requests   Final    BOTTLES DRAWN AEROBIC AND ANAEROBIC Blood Culture results may not be optimal due to an inadequate volume of blood received in culture bottles Performed at [facility], [address] [place], [place] [address]    Culture   Final    NO GROWTH 5 DAYS Performed at [facility], [address] [place], [place] [address]    Report Status 12/26/2023 FINAL  Final  Respiratory (~20 pathogens) panel by PCR     Status: None   Collection Time: 12/21/23  4:48 PM   Specimen: Nasal Mucosa; Respiratory  Result Value Ref Range Status   Adenovirus NOT DETECTED NOT DETECTED Final   Coronavirus 229E NOT DETECTED NOT DETECTED Final    Comment: (NOTE) The Coronavirus on the Respiratory Panel, DOES NOT test for the novel  Coronavirus (2019 nCoV)    Coronavirus HKU1 NOT DETECTED NOT DETECTED Final   Coronavirus NL63 NOT DETECTED NOT DETECTED Final   Coronavirus OC43 NOT DETECTED NOT DETECTED Final   Metapneumovirus  NOT DETECTED NOT DETECTED Final   Rhinovirus / Enterovirus NOT DETECTED NOT DETECTED Final   Influenza A NOT DETECTED NOT DETECTED Final   Influenza B NOT DETECTED NOT DETECTED Final   Parainfluenza Virus 1 NOT DETECTED NOT DETECTED Final   Parainfluenza Virus 2 NOT DETECTED NOT DETECTED Final  Parainfluenza Virus 3 NOT DETECTED NOT DETECTED Final   Parainfluenza Virus 4 NOT DETECTED NOT DETECTED Final   Respiratory Syncytial Virus NOT DETECTED NOT DETECTED Final   Bordetella pertussis NOT DETECTED NOT DETECTED Final   Bordetella Parapertussis NOT DETECTED NOT DETECTED Final   Chlamydophila pneumoniae NOT DETECTED NOT DETECTED Final   Mycoplasma pneumoniae NOT DETECTED NOT DETECTED Final    Comment: Performed at [facility], [address], [place], [place] [address]  MRSA Next Gen by PCR, Nasal     Status: None   Collection Time: 12/21/23  4:48 PM   Specimen: Nasal Mucosa; Nasal Swab  Result Value Ref Range Status   MRSA by PCR Next Gen NOT DETECTED NOT DETECTED Final    Comment: (NOTE) The GeneXpert MRSA Assay (FDA approved for NASAL specimens only), is one component of a comprehensive MRSA colonization surveillance program. It is not intended to diagnose MRSA infection nor to guide or monitor treatment for MRSA infections. Test performance is not FDA approved in patients less than 74 years old. Performed at [facility], [address] [place], [place] [address]      Labs: Basic Metabolic Panel: Recent Labs  Lab 12/21/23 0900 12/21/23 1639 12/22/23 0338 12/24/23 0346 12/25/23 0356 12/26/23 0418  NA 141  --  143 140 139 140  K 3.2*  --  3.8 3.2* 3.3* 3.2*  CL 107  --  111 113* 110 107  CO2 25  --  23 23 23 25   GLUCOSE 107*  --  117* 85 83 87  BUN 17  --  15 15 11  7*  CREATININE 1.30* 1.07* 0.96 0.84 0.78 0.77  CALCIUM  8.8*  --  8.2* 8.8* 8.6* 8.9  MG  --  1.6*  --  2.3 2.2 2.1  PHOS  --  3.6  --   --   --   --    Liver Function  Tests: Recent Labs  Lab 12/21/23 0900 12/22/23 0338 12/26/23 0418  AST 50* 45* 52*  ALT 28 26 57*  ALKPHOS 75 57 61  BILITOT 0.8 0.6 0.8  PROT 7.0 5.2* 6.1*  ALBUMIN  3.3* 2.5* 2.8*   CBC: Recent Labs  Lab 12/21/23 0900 12/21/23 1639 12/22/23 0338 12/24/23 0346 12/26/23 0418  WBC 9.9 8.0 7.5 8.1 10.0  NEUTROABS 6.8  --   --  3.5 4.7  HGB 13.7 11.3* 10.6* 10.3* 11.6*  HCT 44.7 34.8* 34.1* 32.8* 37.1  MCV 94.1 91.6 93.9 92.4 92.8  PLT 226 190 169 156 188   CBG: No results for input(s): "GLUCAP" in the last 168 hours. Hgb A1c No results for input(s): "HGBA1C" in the last 72 hours. Lipid Profile No results for input(s): "CHOL", "HDL", "LDLCALC", "TRIG", "CHOLHDL", "LDLDIRECT" in the last 72 hours. Thyroid  function studies No results for input(s): "TSH", "T4TOTAL", "T3FREE", "THYROIDAB" in the last 72 hours.  Invalid input(s): "FREET3" Urinalysis    Component Value Date/Time   COLORURINE YELLOW 12/21/2023 1230   APPEARANCEUR CLEAR 12/21/2023 1230   LABSPEC 1.027 12/21/2023 1230   PHURINE 6.0 12/21/2023 1230   GLUCOSEU NEGATIVE 12/21/2023 1230   GLUCOSEU NEGATIVE 02/13/2018 1036   HGBUR MODERATE (A) 12/21/2023 1230   BILIRUBINUR NEGATIVE 12/21/2023 1230   BILIRUBINUR negative 01/31/2023 1333   BILIRUBINUR Negative 03/28/2021 1611   KETONESUR NEGATIVE 12/21/2023 1230   PROTEINUR 100 (A) 12/21/2023 1230   UROBILINOGEN 0.2 01/31/2023 1333   UROBILINOGEN 0.2 02/13/2018 1036   NITRITE NEGATIVE 12/21/2023 1230  LEUKOCYTESUR NEGATIVE 12/21/2023 1230    FURTHER DISCHARGE INSTRUCTIONS:   Get Medicines reviewed and adjusted: Please take all your medications with you for your next visit with your Primary MD   Laboratory/radiological data: Please request your Primary MD to go over all hospital tests and procedure/radiological results at the follow up, please ask your Primary MD to get all Hospital records sent to his/her office.   In some cases, they will be blood  work, cultures and biopsy results pending at the time of your discharge. Please request that your primary care M.D. goes through all the records of your hospital data and follows up on these results.   Also Note the following: If you experience worsening of your admission symptoms, develop shortness of breath, life threatening emergency, suicidal or homicidal thoughts you must seek medical attention immediately by calling 911 or calling your MD immediately  if symptoms less severe.   You must read complete instructions/literature along with all the possible adverse reactions/side effects for all the Medicines you take and that have been prescribed to you. Take any new Medicines after you have completely understood and accpet all the possible adverse reactions/side effects.    Do not drive when taking Pain medications or sleeping medications (Benzodaizepines)   Do not take more than prescribed Pain, Sleep and Anxiety Medications. It is not advisable to combine anxiety,sleep and pain medications without talking with your primary care practitioner   Special Instructions: If you have smoked or chewed Tobacco  in the last 2 yrs please stop smoking, stop any regular Alcohol  and or any Recreational drug use.   Wear Seat belts while driving.   Please note: You were cared for by a hospitalist during your hospital stay. Once you are discharged, your primary care physician will handle any further medical issues. Please note that NO REFILLS for any discharge medications will be authorized once you are discharged, as it is imperative that you return to your primary care physician (or establish a relationship with a primary care physician if you do not have one) for your post hospital discharge needs so that they can reassess your need for medications and monitor your lab values.  Time coordinating discharge: 35 minutes  SIGNED:  Kathlen Para, MD, PhD 12/26/2023, 9:18 AM

## 2023-12-26 NOTE — Progress Notes (Signed)
  Daily Progress Note   Patient Name: Eileen Wilkerson       Date: 12/26/2023 DOB: Oct 15, 1932  Age: 89 y.o. MRN#: [id number] Attending Physician: Osborn Blaze, MD Primary Care Physician: Mast, [name], NP Admit Date: 12/21/2023 Length of Stay: 5 days  As per EMR review, patient being discharge today. TOC able to coordinate referral to [facility] palliative services for follow up as per daughter's request. Recommend continued discussions regarding code status; daughter would appreciate discussions to continue care planning.  Thank you for involving our team in patient's care.    Barnett Libel, DO Palliative Care Provider PMT # [phone]

## 2023-12-26 NOTE — Progress Notes (Signed)
 Location:  [facility] Room Number: 15 Place of Service:  SNF (31) Provider:  Medina-Vargas, Cloud Graham, DNP, FNP-BC  [name], [name], [name]  Extended Emergency Contact Information Primary Emergency Contact: Debrew,Jacqueline  [place] Home Phone: [phone] Mobile Phone: [phone] Relation: Daughter  Code Status:   Full Code  Goals of care: Advanced Directive information    12/21/2023    8:00 PM  Advanced Directives  Does Patient Have a Medical Advance Directive? Yes  Type of Estate agent of [place];Living will  Does patient want to make changes to medical advance directive? No - Patient declined  Copy of Healthcare Power of Attorney in Chart? Yes - validated most recent copy scanned in chart (See row information)     Chief Complaint  Patient presents with   Acute Visit    Follow up hospitalization  12/21/23 to 12/26/23    HPI:  Pt is a 89 y.o. female seen today for for hospitalization follow up. She was living at [facility] when she was transferred to hospital and now at [facility] for short-term rehabilitation. She was transferred to the hospital due to confusion, febrile temp 103.3, HR 115,.  Of note, she was seen day before for nausea, vomiting and diarrhea with associated abdominal pain and was thought to be gastritis.  In the ED she got hypotensive SBP 80s and was transferred to ICU.  She was given pressors, IV fluids and IV antibiotics.  She completed her IV antibiotic course and was weaned off vasopressors.  She has improved, no further nausea, vomiting, and able to tolerate her diet.   Past Medical History:  Diagnosis Date   Adrenal failure (HCC)    Arthritis    Asthma    Cancer (HCC)    Cataract    Closed nondisplaced fracture of fifth right metatarsal bone 09/18/2017   Diverticulitis    Per patient   E coli bacteremia     Fibromyalgia 2008   GERD (gastroesophageal reflux disease) 12/18/2016   HCAP (healthcare-associated pneumonia) 02/03/2018   Hyperlipidemia 10/13/2021   Osteoporosis    RA (rheumatoid arthritis) (HCC)    Recurrent upper respiratory infection (URI)    Sepsis due to urinary tract infection (HCC) 01/18/2018   Urticaria    Past Surgical History:  Procedure Laterality Date   BACK SURGERY     BILATERAL CARPAL TUNNEL RELEASE  2005   right and left   CATARACT EXTRACTION, BILATERAL  2004   right and left   CERVICAL FUSION  2011,2010,2008   2 disks   HEEL SPUR SURGERY  2004   lower back fusion  2011   Fusion of 3-4 and 4-5 lower back   RADIOFREQUENCY ABLATION  2020   ROTATOR CUFF REPAIR  [id number]   SQUAMOUS CELL CARCINOMA EXCISION     TONSILLECTOMY AND ADENOIDECTOMY  1947   TOTAL SHOULDER ARTHROPLASTY      Allergies  Allergen Reactions   Adhesive [Tape] Rash   Celebrex [Celecoxib] Hives   Ciprofibrate Nausea Only   Cymbalta [Duloxetine Hcl] Swelling   Gabitril [Tiagabine] Swelling   Lyrica [Pregabalin] Swelling   Neurontin [Gabapentin] Swelling   Nexium  [Esomeprazole ] Rash   Nsaids Rash   Shrimp [Shellfish Allergy] Anaphylaxis    Per patient "shrimp only"   Azactam  [Aztreonam ]     Hand swelling    Azelastine  Hcl     Rash    Ciprofloxacin  Other (  See Comments)    dizziness   Claritin  [Loratadine ]     Irritability Nervousness    Methotrexate  Derivatives    Nasacort  [Triamcinolone ]     Dizzy    Olopatadine  Other (See Comments)    Pain and lethargy    Other    Sulfamethizole Other (See Comments)    unknown   Zantac [Ranitidine Hcl] Other (See Comments)    unknown   Claritin -D 12 Hour [Loratadine -Pseudoephedrine Er] Anxiety   Keflex  [Cephalexin ] Nausea And Vomiting    Tolerated Ancef    Penicillins Rash    Injection site reaction. Tolerated cefepime  in past. Also reports tolerating a penicillin infusion after this initial rxn ~20 yrs ago.  Has patient had a PCN  reaction causing immediate rash, facial/tongue/throat swelling, SOB or lightheadedness with hypotension: No Has patient had a PCN reaction causing severe rash involving mucus membranes or skin necrosis: No Has patient had a PCN reaction that required hospitalization No Has patient had a PCN reaction occurring within the last 10 years: No     Sulfa Antibiotics Nausea And Vomiting    Outpatient Encounter Medications as of 12/26/2023  Medication Sig   acetaminophen  (TYLENOL ) 500 MG tablet Take 650 mg by mouth in the morning and at bedtime.   amLODipine  (NORVASC ) 5 MG tablet Take 5 mg by mouth daily.   atorvastatin  (LIPITOR) 20 MG tablet TAKE 1 TABLET BY MOUTH EVERY DAY   Biotin  5 MG TBDP Take 1 tablet (5 mg total) by mouth daily.   Cholecalciferol  (VITAMIN D3) 125 MCG (5000 UT) CAPS TAKE 1 CAPSULE BY MOUTH EVERY DAY   colchicine  0.6 MG tablet TAKE 1 TABLET BY MOUTH EVERY DAY   CRANBERRY PO Take 1 capsule by mouth 2 (two) times daily.   cycloSPORINE  (RESTASIS ) 0.05 % ophthalmic emulsion Place 1 drop into both eyes 2 (two) times daily.   D-Mannose 500 MG CAPS Take 500 mg by mouth daily.   diclofenac  Sodium (VOLTAREN ) 1 % GEL APPLY 2 TO 4 GRAMS TOPICALLY TO AFFECTED JOINTS UP TO 4 TIMES DAILY   fexofenadine  (ALLEGRA ) 180 MG tablet Take 1 tablet (180 mg total) by mouth daily.   ipratropium-albuterol  (DUONEB) 0.5-2.5 (3) MG/3ML SOLN Take 3 mLs by nebulization as needed.   lansoprazole  (PREVACID ) 30 MG capsule TAKE 1 CAPSULE BY MOUTH ONCE DAILY AT NOON (Patient taking differently: Take 30 mg by mouth daily in the afternoon.)   montelukast  (SINGULAIR ) 10 MG tablet TAKE 1 TABLET BY MOUTH EVERYDAY AT BEDTIME (Patient taking differently: Take 10 mg by mouth at bedtime.)   ondansetron  (ZOFRAN ) 4 MG tablet Take 4 mg by mouth as needed for nausea or vomiting.   potassium chloride  SA (KLOR-CON  M) 20 MEQ tablet Take 40 mEq by mouth 2 (two) times daily.   predniSONE  (DELTASONE ) 5 MG tablet Take 5 mg by mouth  daily with breakfast.   saccharomyces boulardii (FLORASTOR) 250 MG capsule Take 250 mg by mouth 2 (two) times daily.   Torsemide 40 MG TABS Take 40 mg by mouth daily.   traMADol  (ULTRAM ) 50 MG tablet Take 1 tablet (50 mg total) by mouth every 6 (six) hours as needed (pain).   Facility-Administered Encounter Medications as of 12/26/2023  Medication   acetaminophen  (TYLENOL ) tablet 650 mg   docusate sodium  (COLACE) capsule 100 mg   heparin  injection 5,000 Units   lip balm (CARMEX) ointment 1 Application   Oral care mouth rinse   polyethylene glycol (MIRALAX  / GLYCOLAX ) packet 17 g   predniSONE  (DELTASONE ) tablet  5 mg    Review of Systems  Constitutional:  Negative for appetite change, chills, fatigue and fever.  HENT:  Negative for congestion, hearing loss, rhinorrhea and sore throat.   Eyes: Negative.   Respiratory:  Negative for cough, shortness of breath and wheezing.   Cardiovascular:  Negative for chest pain, palpitations and leg swelling.  Gastrointestinal:  Negative for abdominal pain, constipation, diarrhea, nausea and vomiting.  Genitourinary:  Negative for dysuria.  Musculoskeletal:  Negative for arthralgias, back pain and myalgias.  Skin:  Negative for color change, rash and wound.  Neurological:  Positive for weakness. Negative for dizziness and headaches.  Psychiatric/Behavioral:  Negative for behavioral problems. The patient is not nervous/anxious.       Immunization History  Administered Date(s) Administered   Fluad Quad(high Dose 65+) 09/30/2020   Influenza, High Dose Seasonal PF 08/16/2018, 09/08/2019, 09/24/2020, 10/04/2021, 09/12/2023   Influenza,inj,Quad PF,6+ Mos 08/11/2016   Influenza-Unspecified 11/12/2017, 10/05/2022   Moderna Covid-19 Vaccine Bivalent Booster 77yrs & up 10/04/2021, 09/26/2023   Moderna Sars-Covid-2 Vaccination 12/15/2019, 01/12/2020, 10/19/2020   PNEUMOCOCCAL CONJUGATE-20 10/04/2021   PPD Test 03/01/2018   Pneumococcal Conjugate-13  06/26/2017   Pneumococcal Polysaccharide-23 12/11/2009   Tdap 04/04/2023   Zoster Recombinant(Shingrix) 01/28/2019, 09/02/2019   Pertinent  Health Maintenance Due  Topic Date Due   INFLUENZA VACCINE  Completed   DEXA SCAN  Completed      01/31/2023    1:08 PM 02/19/2023    2:29 PM 03/06/2023   11:57 AM 03/13/2023   10:48 AM 03/22/2023   10:53 AM  Fall Risk  Falls in the past year? 0 1 0 0 0  Was there an injury with Fall? 0 1 0 0 0  Fall Risk Category Calculator 0 3 0 0 0  Patient at Risk for Falls Due to Impaired balance/gait;Impaired mobility History of fall(s);Impaired balance/gait History of fall(s);Impaired balance/gait History of fall(s);Impaired balance/gait History of fall(s);Impaired balance/gait  Fall risk Follow up Falls evaluation completed Falls evaluation completed Falls evaluation completed Falls evaluation completed Falls evaluation completed     Vitals:   12/26/23 1250  BP: (!) 146/78  Pulse: 82  Resp: 18  Temp: (!) 97.4 F (36.3 C)  SpO2: 94%  Weight: 171 lb 15.3 oz (78 kg)  Height: 5\' 4"  (1.626 m)   Body mass index is 29.52 kg/m.  Physical Exam Constitutional:      General: She is not in acute distress. HENT:     Head: Normocephalic and atraumatic.     Nose: Nose normal.     Mouth/Throat:     Mouth: Mucous membranes are moist.  Eyes:     Conjunctiva/sclera: Conjunctivae normal.  Cardiovascular:     Rate and Rhythm: Normal rate and regular rhythm.  Pulmonary:     Effort: Pulmonary effort is normal.     Breath sounds: Normal breath sounds.  Abdominal:     General: Bowel sounds are normal.     Palpations: Abdomen is soft.  Musculoskeletal:        General: Normal range of motion.     Cervical back: Normal range of motion.  Skin:    General: Skin is warm and dry.  Neurological:     General: No focal deficit present.     Mental Status: She is alert and oriented to person, place, and time.  Psychiatric:        Mood and Affect: Mood normal.         Behavior: Behavior normal.  Thought Content: Thought content normal.        Judgment: Judgment normal.        Labs reviewed: Recent Labs    12/21/23 1639 12/22/23 0338 12/24/23 0346 12/25/23 0356 12/26/23 0418  NA  --    < > 140 139 140  K  --    < > 3.2* 3.3* 3.2*  CL  --    < > 113* 110 107  CO2  --    < > 23 23 25   GLUCOSE  --    < > 85 83 87  BUN  --    < > 15 11 7*  CREATININE 1.07*   < > 0.84 0.78 0.77  CALCIUM   --    < > 8.8* 8.6* 8.9  MG 1.6*  --  2.3 2.2 2.1  PHOS 3.6  --   --   --   --    < > = values in this interval not displayed.   Recent Labs    12/21/23 0900 12/22/23 0338 12/26/23 0418  AST 50* 45* 52*  ALT 28 26 57*  ALKPHOS 75 57 61  BILITOT 0.8 0.6 0.8  PROT 7.0 5.2* 6.1*  ALBUMIN  3.3* 2.5* 2.8*   Recent Labs    12/21/23 0900 12/21/23 1639 12/22/23 0338 12/24/23 0346 12/26/23 0418  WBC 9.9   < > 7.5 8.1 10.0  NEUTROABS 6.8  --   --  3.5 4.7  HGB 13.7   < > 10.6* 10.3* 11.6*  HCT 44.7   < > 34.1* 32.8* 37.1  MCV 94.1   < > 93.9 92.4 92.8  PLT 226   < > 169 156 188   < > = values in this interval not displayed.   Lab Results  Component Value Date   TSH 0.865 02/27/2022   Lab Results  Component Value Date   HGBA1C 6.5 11/28/2023   Lab Results  Component Value Date   CHOL 151 06/05/2023   HDL 47 06/05/2023   LDLCALC 77 06/05/2023   TRIG 174 (A) 06/05/2023   CHOLHDL 2.6 04/06/2020    Significant Diagnostic Results in last 30 days:  ECHOCARDIOGRAM COMPLETE Result Date: 12/21/2023    ECHOCARDIOGRAM REPORT   Patient Name:   Eileen Wilkerson Date of Exam: 12/21/2023 Medical Rec #:  [id number]          Height:       64.0 in Accession #:    [id number]         Weight:       162.0 lb Date of Birth:  November 06, 1932          BSA:          1.789 m Patient Age:    89 years           BP:           97/58 mmHg Patient Gender: F                  HR:           76 bpm. Exam Location:  Inpatient Procedure: 2D Echo, Cardiac Doppler and Color  Doppler STAT ECHO Indications:    Shock  History:        Patient has prior history of Echocardiogram examinations, most                 recent 05/22/2022. CHF, Arrythmias:Tachycardia,  Signs/Symptoms:Shortness of Breath; Risk Factors:Dyslipidemia                 and Hypertension.  Sonographer:    Juanita Shaw Referring Phys: [id number] JONATHAN B DEWALD IMPRESSIONS  1. Left ventricular ejection fraction, by estimation, is 55 to 60%. The left ventricle has normal function. The left ventricle has no regional wall motion abnormalities. Left ventricular diastolic parameters are consistent with Grade I diastolic dysfunction (impaired relaxation).  2. Right ventricular systolic function is normal. The right ventricular size is normal. Tricuspid regurgitation signal is inadequate for assessing PA pressure.  3. The mitral valve is normal in structure. No evidence of mitral valve regurgitation. No evidence of mitral stenosis.  4. The aortic valve is tricuspid. There is mild calcification of the aortic valve. Aortic valve regurgitation is not visualized. No aortic stenosis is present.  5. The inferior vena cava is normal in size with greater than 50% respiratory variability, suggesting right atrial pressure of 3 mmHg. FINDINGS  Left Ventricle: Left ventricular ejection fraction, by estimation, is 55 to 60%. The left ventricle has normal function. The left ventricle has no regional wall motion abnormalities. The left ventricular internal cavity size was normal in size. There is  no left ventricular hypertrophy. Left ventricular diastolic parameters are consistent with Grade I diastolic dysfunction (impaired relaxation). Right Ventricle: The right ventricular size is normal. No increase in right ventricular wall thickness. Right ventricular systolic function is normal. Tricuspid regurgitation signal is inadequate for assessing PA pressure. Left Atrium: Left atrial size was normal in size. Right Atrium: Right atrial  size was normal in size. Pericardium: There is no evidence of pericardial effusion. Mitral Valve: The mitral valve is normal in structure. No evidence of mitral valve regurgitation. No evidence of mitral valve stenosis. MV peak gradient, 2.5 mmHg. The mean mitral valve gradient is 1.0 mmHg. Tricuspid Valve: The tricuspid valve is normal in structure. Tricuspid valve regurgitation is not demonstrated. Aortic Valve: The aortic valve is tricuspid. There is mild calcification of the aortic valve. Aortic valve regurgitation is not visualized. No aortic stenosis is present. Aortic valve mean gradient measures 6.0 mmHg. Aortic valve peak gradient measures 11.4 mmHg. Aortic valve area, by VTI measures 2.06 cm. Pulmonic Valve: The pulmonic valve was normal in structure. Pulmonic valve regurgitation is not visualized. Aorta: The aortic root is normal in size and structure. Venous: The inferior vena cava is normal in size with greater than 50% respiratory variability, suggesting right atrial pressure of 3 mmHg. IAS/Shunts: No atrial level shunt detected by color flow Doppler.  LEFT VENTRICLE PLAX 2D LVIDd:         3.90 cm      Diastology LVIDs:         3.00 cm      LV e' medial:    6.85 cm/s LV PW:         0.70 cm      LV E/e' medial:  9.8 LV IVS:        0.80 cm      LV e' lateral:   8.05 cm/s LVOT diam:     1.80 cm      LV E/e' lateral: 8.3 LV SV:         64 LV SV Index:   36 LVOT Area:     2.54 cm  LV Volumes (MOD) LV vol d, MOD A2C: 104.0 ml LV vol d, MOD A4C: 112.0 ml LV vol s, MOD A2C: 34.3 ml LV vol s,  MOD A4C: 50.3 ml LV SV MOD A2C:     69.7 ml LV SV MOD A4C:     112.0 ml LV SV MOD BP:      67.4 ml RIGHT VENTRICLE             IVC RV Basal diam:  3.80 cm     IVC diam: 1.70 cm RV Mid diam:    3.70 cm RV S prime:     13.50 cm/s TAPSE (M-mode): 2.6 cm LEFT ATRIUM             Index        RIGHT ATRIUM          Index LA diam:        3.10 cm 1.73 cm/m   RA Area:     9.11 cm LA Vol (A2C):   22.4 ml 12.52 ml/m  RA Volume:    17.40 ml 9.73 ml/m LA Vol (A4C):   25.1 ml 14.03 ml/m LA Biplane Vol: 25.9 ml 14.48 ml/m  AORTIC VALVE                     PULMONIC VALVE AV Area (Vmax):    2.02 cm      PV Vmax:       0.78 m/s AV Area (Vmean):   1.51 cm      PV Peak grad:  2.4 mmHg AV Area (VTI):     2.06 cm AV Vmax:           169.00 cm/s AV Vmean:          119.000 cm/s AV VTI:            0.309 m AV Peak Grad:      11.4 mmHg AV Mean Grad:      6.0 mmHg LVOT Vmax:         134.00 cm/s LVOT Vmean:        70.800 cm/s LVOT VTI:          0.250 m LVOT/AV VTI ratio: 0.81  AORTA Ao Root diam: 2.80 cm Ao Asc diam:  2.90 cm MITRAL VALVE MV Area (PHT): 3.12 cm    SHUNTS MV Area VTI:   2.37 cm    Systemic VTI:  0.25 m MV Peak grad:  2.5 mmHg    Systemic Diam: 1.80 cm MV Mean grad:  1.0 mmHg MV Vmax:       0.79 m/s MV Vmean:      51.8 cm/s MV Decel Time: 243 msec MV E velocity: 67.00 cm/s MV A velocity: 62.90 cm/s MV E/A ratio:  1.07 Dalton McleanMD Electronically signed by Archer Bear Signature Date/Time: 12/21/2023/3:10:49 PM    Final    DG Hip Unilat W or Wo Pelvis 2-3 Views Left Result Date: 12/21/2023 CLINICAL DATA:  Left hip pain. EXAM: DG HIP (WITH OR WITHOUT PELVIS) 2-3V LEFT COMPARISON:  None Available. FINDINGS: There is no acute fracture or dislocation.Evaluation for fracture is limited due to osteopenia and body habitus. There is moderate left and severe right hip osteoarthritic changes. There is near complete loss of right hip joint space with near bone on bone contact. Excreted contrast noted in the urinary bladder. The soft tissues are unremarkable. IMPRESSION: 1. No acute fracture or dislocation. 2. Moderate left and severe right hip osteoarthritis. Electronically Signed   By: Angus Bark M.D.   On: 12/21/2023 12:38   CT Angio Chest PE W and/or Wo Contrast Result Date: 12/21/2023 CLINICAL  DATA:  Pulmonary embolism (PE) suspected, high prob; Epigastric pain sepsis. Nausea and lethargy. EXAM: CT ANGIOGRAPHY CHEST CT ABDOMEN  AND PELVIS WITH CONTRAST TECHNIQUE: Multidetector CT imaging of the chest was performed using the standard protocol during bolus administration of intravenous contrast. Multiplanar CT image reconstructions and MIPs were obtained to evaluate the vascular anatomy. Multidetector CT imaging of the abdomen and pelvis was performed using the standard protocol during bolus administration of intravenous contrast. RADIATION DOSE REDUCTION: This exam was performed according to the departmental dose-optimization program which includes automated exposure control, adjustment of the mA and/or kV according to patient size and/or use of iterative reconstruction technique. CONTRAST:  75mL OMNIPAQUE  IOHEXOL  350 MG/ML SOLN COMPARISON:  CT scan abdomen and pelvis from 02/12/2023. FINDINGS: CTA CHEST FINDINGS Cardiovascular: No evidence of embolism to the proximal subsegmental pulmonary artery level. Normal cardiac size. No pericardial effusion. No aortic aneurysm. There are coronary artery calcifications, in keeping with coronary artery disease. Mediastinum/Nodes: Visualized thyroid  gland appears grossly unremarkable. No solid / cystic mediastinal masses. The esophagus is nondistended precluding optimal assessment. There are few mildly prominent mediastinal and hilar lymph nodes, which do not meet the size criteria for lymphadenopathy. No axillary lymphadenopathy by size criteria. Lungs/Pleura: The central tracheo-bronchial tree is patent. There is mild, smooth, circumferential thickening of the segmental and subsegmental bronchial walls, throughout bilateral lungs, which is nonspecific. Findings are most commonly seen with bronchitis or reactive airway disease, such as asthma. There are occlusive and nonocclusive filling defects in the distal segmental and subsegmental bronchi, throughout bilateral lungs, likely due to mucus/secretions or aspiration. There are multiple solid noncalcified nodules throughout bilateral lungs with largest  in the anterior left upper lobe measuring up to 1.8 x 1.8 cm. This nodule exhibits small central cavitation. Rest of the nodules are smaller in size and do not exhibit cavitations. These are compatible with metastases. No consolidation, pleural effusion or pneumothorax. There are dependent changes in bilateral lungs. Musculoskeletal: The visualized soft tissues of the chest wall are grossly unremarkable. No suspicious osseous lesions. There are mild multilevel degenerative changes in the visualized spine. Review of the MIP images confirms the above findings. CT ABDOMEN and PELVIS FINDINGS Hepatobiliary: The liver is normal in size. Non-cirrhotic configuration. No suspicious mass. No intrahepatic or extrahepatic bile duct dilation. No calcified gallstones. Normal gallbladder wall thickness. No pericholecystic inflammatory changes. Pancreas: Unremarkable. No pancreatic ductal dilatation or surrounding inflammatory changes. Spleen: Within normal limits. No focal lesion. Adrenals/Urinary Tract: Adrenal glands are unremarkable. No suspicious renal mass. There is a 2.3 x 2.4 cm simple cyst in the left kidney interpolar region, anteriorly. There are additional several smaller cysts throughout bilateral kidneys. No hydronephrosis. No renal or ureteric calculi. Unremarkable urinary bladder. Stomach/Bowel: There is a small sliding hiatal hernia. No disproportionate dilation of the small or large bowel loops. No evidence of abnormal bowel wall thickening or inflammatory changes. The appendix is unremarkable. There are scattered diverticula mainly in the sigmoid colon, without imaging signs of diverticulitis. Vascular/Lymphatic: No ascites or pneumoperitoneum. No abdominal or pelvic lymphadenopathy, by size criteria. No aneurysmal dilation of the major abdominal arteries. There are mild peripheral atherosclerotic vascular calcifications of the aorta and its major branches. Reproductive: The uterus is unremarkable. There is a  3.5 x 4.3 cm hypoattenuating structure in the left adnexa, most likely ovarian in etiology. The structure exhibits fluid attenuation and has no discrete mural nodule. Even though this is incompletely characterized on the current exam, favored to represent a senescent ovarian cyst. Annual  follow-up ultrasound evaluation is recommended to documenting stability. Other: There is a tiny fat containing umbilical hernia. The soft tissues and abdominal wall are otherwise unremarkable. Musculoskeletal: No suspicious osseous lesions. There are moderate multilevel degenerative changes in the visualized spine. Review of the MIP images confirms the above findings. IMPRESSION: 1. No embolism to the proximal subsegmental pulmonary artery level. 2. There are multiple solid noncalcified lung nodules, compatible with metastases. 3. No acute inflammatory process identified within the abdomen or pelvis. 4. Multiple other nonacute observations, as described above. Electronically Signed   By: Beula Brunswick M.D.   On: 12/21/2023 11:18   CT ABDOMEN PELVIS W CONTRAST Result Date: 12/21/2023 CLINICAL DATA:  Pulmonary embolism (PE) suspected, high prob; Epigastric pain sepsis. Nausea and lethargy. EXAM: CT ANGIOGRAPHY CHEST CT ABDOMEN AND PELVIS WITH CONTRAST TECHNIQUE: Multidetector CT imaging of the chest was performed using the standard protocol during bolus administration of intravenous contrast. Multiplanar CT image reconstructions and MIPs were obtained to evaluate the vascular anatomy. Multidetector CT imaging of the abdomen and pelvis was performed using the standard protocol during bolus administration of intravenous contrast. RADIATION DOSE REDUCTION: This exam was performed according to the departmental dose-optimization program which includes automated exposure control, adjustment of the mA and/or kV according to patient size and/or use of iterative reconstruction technique. CONTRAST:  75mL OMNIPAQUE  IOHEXOL  350 MG/ML SOLN  COMPARISON:  CT scan abdomen and pelvis from 02/12/2023. FINDINGS: CTA CHEST FINDINGS Cardiovascular: No evidence of embolism to the proximal subsegmental pulmonary artery level. Normal cardiac size. No pericardial effusion. No aortic aneurysm. There are coronary artery calcifications, in keeping with coronary artery disease. Mediastinum/Nodes: Visualized thyroid  gland appears grossly unremarkable. No solid / cystic mediastinal masses. The esophagus is nondistended precluding optimal assessment. There are few mildly prominent mediastinal and hilar lymph nodes, which do not meet the size criteria for lymphadenopathy. No axillary lymphadenopathy by size criteria. Lungs/Pleura: The central tracheo-bronchial tree is patent. There is mild, smooth, circumferential thickening of the segmental and subsegmental bronchial walls, throughout bilateral lungs, which is nonspecific. Findings are most commonly seen with bronchitis or reactive airway disease, such as asthma. There are occlusive and nonocclusive filling defects in the distal segmental and subsegmental bronchi, throughout bilateral lungs, likely due to mucus/secretions or aspiration. There are multiple solid noncalcified nodules throughout bilateral lungs with largest in the anterior left upper lobe measuring up to 1.8 x 1.8 cm. This nodule exhibits small central cavitation. Rest of the nodules are smaller in size and do not exhibit cavitations. These are compatible with metastases. No consolidation, pleural effusion or pneumothorax. There are dependent changes in bilateral lungs. Musculoskeletal: The visualized soft tissues of the chest wall are grossly unremarkable. No suspicious osseous lesions. There are mild multilevel degenerative changes in the visualized spine. Review of the MIP images confirms the above findings. CT ABDOMEN and PELVIS FINDINGS Hepatobiliary: The liver is normal in size. Non-cirrhotic configuration. No suspicious mass. No intrahepatic or  extrahepatic bile duct dilation. No calcified gallstones. Normal gallbladder wall thickness. No pericholecystic inflammatory changes. Pancreas: Unremarkable. No pancreatic ductal dilatation or surrounding inflammatory changes. Spleen: Within normal limits. No focal lesion. Adrenals/Urinary Tract: Adrenal glands are unremarkable. No suspicious renal mass. There is a 2.3 x 2.4 cm simple cyst in the left kidney interpolar region, anteriorly. There are additional several smaller cysts throughout bilateral kidneys. No hydronephrosis. No renal or ureteric calculi. Unremarkable urinary bladder. Stomach/Bowel: There is a small sliding hiatal hernia. No disproportionate dilation of the small or large bowel loops.  No evidence of abnormal bowel wall thickening or inflammatory changes. The appendix is unremarkable. There are scattered diverticula mainly in the sigmoid colon, without imaging signs of diverticulitis. Vascular/Lymphatic: No ascites or pneumoperitoneum. No abdominal or pelvic lymphadenopathy, by size criteria. No aneurysmal dilation of the major abdominal arteries. There are mild peripheral atherosclerotic vascular calcifications of the aorta and its major branches. Reproductive: The uterus is unremarkable. There is a 3.5 x 4.3 cm hypoattenuating structure in the left adnexa, most likely ovarian in etiology. The structure exhibits fluid attenuation and has no discrete mural nodule. Even though this is incompletely characterized on the current exam, favored to represent a senescent ovarian cyst. Annual follow-up ultrasound evaluation is recommended to documenting stability. Other: There is a tiny fat containing umbilical hernia. The soft tissues and abdominal wall are otherwise unremarkable. Musculoskeletal: No suspicious osseous lesions. There are moderate multilevel degenerative changes in the visualized spine. Review of the MIP images confirms the above findings. IMPRESSION: 1. No embolism to the proximal  subsegmental pulmonary artery level. 2. There are multiple solid noncalcified lung nodules, compatible with metastases. 3. No acute inflammatory process identified within the abdomen or pelvis. 4. Multiple other nonacute observations, as described above. Electronically Signed   By: Beula Brunswick M.D.   On: 12/21/2023 11:18   DG Chest Port 1 View Result Date: 12/21/2023 CLINICAL DATA:  89 year old female with possible sepsis. EXAM: PORTABLE CHEST 1 VIEW COMPARISON:  Portable chest 02/12/2023 and earlier. FINDINGS: Portable AP semi upright view at 0844 hours. Low lung volumes not significantly changed from last year. Stable cardiac size and mediastinal contours. Visualized tracheal air column is within normal limits. Allowing for portable technique the lungs are clear. Chronic cervical ACDF. Stable visualized osseous structures. Negative visible bowel gas. IMPRESSION: Stable with chronically low lung volumes. No acute cardiopulmonary abnormality. Electronically Signed   By: Marlise Simpers M.D.   On: 12/21/2023 09:02    Assessment/Plan  1. Sepsis with encephalopathy and septic shock, due to unspecified organism (HCC) (Primary) -  was given pressors, IV fluids and IV antibiotics -  -  completed IV antibiotic course  2. Adrenal insufficiency (HCC) was initially on IV Hydrocortisone  and switched to oral prednisone  5 mg daily from 12/24/2023 onwards -  follow up with endocrinology  3. Rheumatoid arthritis involving multiple sites with positive rheumatoid factor (HCC) -   was initially on IV Hydrocortisone  and switched to oral prednisone  5 mg daily from 12/24/2023 onwards -   Follow-up with rheumatology  4. Physical deconditioning -   For PT and OT, for therapeutic strengthening exercises -   Fall precautions  5. Hypokalemia Lab Results  Component Value Date   K 3.2 (L) 12/26/2023    -   Continue potassium chloride  40 mEq twice a day -  will repeat BMP in 1 week  6. Primary hypertension -  BP  146/78 -   Continue amlodipine   7. Lung nodules -   Follow-up with pulmonology  8.  Chronic diastolic heart failure -  no SOB -   Continue torsemide,  Family/ staff Communication: Discussed plan of care with resident and charge nurse.  Labs/tests ordered: BMP and CBC in 1 week    Inge Mangle, DNP, MSN, FNP-BC [facility] [phone] (Monday-Friday 8:00 a.m. - 5:00 p.m.) [phone] (after hours)

## 2023-12-26 NOTE — Progress Notes (Addendum)
 [facility] Liaison Note:   (new referral for outpatient palliative services)  Notified by [facility], Simona Dublin, LCSW,  of family request for [facility] Palliative Care services at [facility].  Referral submitted today.    Please call with any hospice or outpatient palliative care related questions.  Thank you for the opportunity to participate in this patient's care.  Helmut Lobe, [facility] Liaison [phone]

## 2023-12-26 NOTE — TOC Transition Note (Signed)
 Transition of Care [facility]) - Discharge Note   Patient Details  Name: Eileen Wilkerson MRN: [id number] Date of Birth: 21-Aug-1932  [facility] CM/SW Contact:  Gertha Ku, LCSW Phone Number: 12/26/2023, 9:20 AM   Clinical Narrative:    Pt's insurance [employer] [facility]. [facility] [place], pt's room assignment is Cherise Cornelia, RN to call report to [phone] or [phone]. CSW spoke with pt's daughter who agrees with d/c plan. Pt's daughter has chosen [facility] for OP palliative. Referral sent to [facility] with [facility]. TOC sign off.      Final next level of care: Skilled Nursing Facility Barriers to Discharge: Barriers Resolved   Patient Goals and CMS Choice Patient states their goals for this hospitalization and ongoing recovery are:: [facility]          Discharge Placement              Patient chooses bed at: [facility] [place] Patient to be transferred to facility by: EMS Name of family member notified: Velinda Getting (Daughter)  [phone] (Mobile) Patient and family notified of of transfer: 12/26/23  Discharge Plan and Services Additional resources added to the After Visit Summary for                                       Social Drivers of Health (SDOH) Interventions SDOH Screenings   Food Insecurity: No Food Insecurity (12/21/2023)  Housing: Low Risk  (12/21/2023)  Transportation Needs: No Transportation Needs (12/21/2023)  Utilities: Not At Risk (12/21/2023)  Depression (PHQ2-9): Low Risk  (11/05/2023)  Tobacco Use: Medium Risk (12/24/2023)     Readmission Risk Interventions    05/30/2022   10:08 AM  Readmission Risk Prevention Plan  Transportation Screening Complete  PCP or Specialist Appt within 5-7 Days Complete  Home Care Screening Complete  Medication Review (RN CM) Complete

## 2023-12-26 NOTE — Progress Notes (Signed)
 AVS given to patient and explained at the bedside. Medications and follow up appointments have been explained with pt verbalizing understanding. AVS also reviewed with receiving nurse at [facility]. Medications and follow up appointments have been explained with pt's receiving nurse verbalizing understanding.

## 2023-12-27 ENCOUNTER — Non-Acute Institutional Stay (SKILLED_NURSING_FACILITY): Payer: Self-pay | Admitting: Sports Medicine

## 2023-12-27 DIAGNOSIS — N1831 Chronic kidney disease, stage 3a: Secondary | ICD-10-CM | POA: Diagnosis not present

## 2023-12-27 DIAGNOSIS — N949 Unspecified condition associated with female genital organs and menstrual cycle: Secondary | ICD-10-CM | POA: Diagnosis not present

## 2023-12-27 DIAGNOSIS — M25571 Pain in right ankle and joints of right foot: Secondary | ICD-10-CM

## 2023-12-27 DIAGNOSIS — E876 Hypokalemia: Secondary | ICD-10-CM | POA: Diagnosis not present

## 2023-12-27 DIAGNOSIS — R918 Other nonspecific abnormal finding of lung field: Secondary | ICD-10-CM

## 2023-12-27 DIAGNOSIS — M0579 Rheumatoid arthritis with rheumatoid factor of multiple sites without organ or systems involvement: Secondary | ICD-10-CM | POA: Diagnosis not present

## 2023-12-27 DIAGNOSIS — I10 Essential (primary) hypertension: Secondary | ICD-10-CM | POA: Diagnosis not present

## 2023-12-27 NOTE — Progress Notes (Signed)
Provider: Andree Wilkerson Location:   [facility]   Place of Service:   [facility]   PCP: Mast, [name], NP [name], [name], [name]  Extended Emergency Contact Information Primary Emergency Contact: Eileen Wilkerson [place] of [place] Home Phone: [phone] Mobile Phone: [phone] Relation: Daughter  Code Status:  Goals of Care: Advanced Directive information    12/21/2023    8:00 PM  Advanced Directives  Does Patient Have a Medical Advance Directive? Yes  Type of Estate agent of [place];Living will  Does patient want to make changes to medical advance directive? No - Patient declined  Copy of Healthcare Power of Attorney in Chart? Yes - validated most recent copy scanned in chart (See row information)      No chief complaint on file.   HPI: Patient is a 89 y.o. female seen today for admission to Skilled care As per discharge summary ''She is 89 year old female with history of rheumatoid arthritis on prednisone, abdomen insufficiency, heart failure with preserved ejection fraction, CKD was hospitalized secondary to sepsis after GI illness.  Patient was hypotensive placed on pressors, IV antibiotics.  She was weaned off the pressors completed antibiotic course and discharged to skilled nursing facility.''  Pt seen and examined in her room  Laying on her bed Ot c/o pain in her RT ankle  No recent trauma Pt denies runny nose, cough , sob, abdominal pain, nausea, vomiting, dysuria, hematuria, bloody or dark stools.     Past Medical History:  Diagnosis Date   Adrenal failure (HCC)    Arthritis    Asthma    Cancer (HCC)    Cataract    Closed nondisplaced fracture of fifth right metatarsal bone 09/18/2017   Diverticulitis    Per patient   E coli bacteremia    Fibromyalgia 2008   GERD (gastroesophageal reflux disease) 12/18/2016   HCAP (healthcare-associated  pneumonia) 02/03/2018   Hyperlipidemia 10/13/2021   Osteoporosis    RA (rheumatoid arthritis) (HCC)    Recurrent upper respiratory infection (URI)    Sepsis due to urinary tract infection (HCC) 01/18/2018   Urticaria    Past Surgical History:  Procedure Laterality Date   BACK SURGERY     BILATERAL CARPAL TUNNEL RELEASE  2005   right and left   CATARACT EXTRACTION, BILATERAL  2004   right and left   CERVICAL FUSION  2011,2010,2008   2 disks   HEEL SPUR SURGERY  2004   lower back fusion  2011   Fusion of 3-4 and 4-5 lower back   RADIOFREQUENCY ABLATION  2020   ROTATOR CUFF REPAIR  [id number]   SQUAMOUS CELL CARCINOMA EXCISION     TONSILLECTOMY AND ADENOIDECTOMY  1947   TOTAL SHOULDER ARTHROPLASTY      reports that she quit smoking about 57 years ago. Her smoking use included cigarettes. She started smoking about 67 years ago. She has a 7.5 pack-year smoking history. She has never been exposed to tobacco smoke. She has never used smokeless tobacco. She reports that she does not drink alcohol and does not use drugs. Social History   Socioeconomic History   Marital status: Widowed    Spouse name: Not on file   Number of children: 2   Years of education: Not on file   Highest education level: Not on file  Occupational History   Occupation: Retired Child psychotherapist  Tobacco Use   Smoking status: Former  Current packs/day: 0.00    Average packs/day: 0.8 packs/day for 10.0 years (7.5 ttl pk-yrs)    Types: Cigarettes    Start date: 54    Quit date: 1968    Years since quitting: 57.0    Passive exposure: Never   Smokeless tobacco: Never  Vaping Use   Vaping status: Never Used  Substance and Sexual Activity   Alcohol use: No   Drug use: No   Sexual activity: Not Currently  Other Topics Concern   Not on file  Social History Narrative      Diet:        Do you drink/ eat things with caffeine? Dr. Reino Kent 2/ day      Marital status: Widowed                               What year were you married ? 1953      Do you live in a house, apartment,assistred living, condo, trailer, etc.)? Apartment      Is it one or more stories?       How many persons live in your home ? 1      Do you have any pets in your home ?(please list) No      Highest Level of education completed: PhD       Current or past profession: Paramedic, Child psychotherapist, Special Educator       Do you exercise?   No                           Type & how often       ADVANCED DIRECTIVES (Please bring copies)      Do you have a living will? Yes      Do you have a DNR form?                       If not, do you want to discuss one? Yes      Do you have signed POA?HPOA forms?                 If so, please bring to your appointment Tes      FUNCTIONAL STATUS- To be completed by Spouse / child / Staff       Do you have difficulty bathing or dressing yourself ? No      Do you have difficulty preparing food or eating ? No      Do you have difficulty managing your mediation ? No      Do you have difficulty managing your finances ? No      Do you have difficulty affording your medication ? No      Social Drivers of Corporate investment banker Strain: Not on file  Food Insecurity: No Food Insecurity (12/21/2023)   Hunger Vital Sign    Worried About Running Out of Food in the Last Year: Never true    Ran Out of Food in the Last Year: Never true  Transportation Needs: No Transportation Needs (12/21/2023)   PRAPARE - Administrator, Civil Service (Medical): No    Lack of Transportation (Non-Medical): No  Physical Activity: Not on file  Stress: Not on file  Social Connections: Not on file  Intimate Partner Violence: Not At Risk (12/21/2023)   Humiliation, Afraid, Rape, and Kick questionnaire  Fear of Current or Ex-Partner: No    Emotionally Abused: No    Physically Abused: No    Sexually Abused: No    Functional Status Survey:    Family History  Problem Relation Age of  Onset   Heart attack Maternal Grandmother    Heart attack Paternal Grandfather    Breast cancer Mother 18   Diabetes Father    Heart disease Father    Congestive Heart Failure Father 1       Died from   Allergic rhinitis Neg Hx    Asthma Neg Hx    Eczema Neg Hx    Urticaria Neg Hx     Health Maintenance  Topic Date Due   COVID-19 Vaccine (6 - 2024-25 season) 11/21/2023   DTaP/Tdap/Td (2 - Td or Tdap) 04/03/2033   Pneumonia Vaccine 24+ Years old  Completed   INFLUENZA VACCINE  Completed   DEXA SCAN  Completed   Zoster Vaccines- Shingrix  Completed   HPV VACCINES  Aged Out    Allergies  Allergen Reactions   Adhesive [Tape] Rash   Celebrex [Celecoxib] Hives   Ciprofibrate Nausea Only   Cymbalta [Duloxetine Hcl] Swelling   Gabitril [Tiagabine] Swelling   Lyrica [Pregabalin] Swelling   Neurontin [Gabapentin] Swelling   Nexium [Esomeprazole] Rash   Nsaids Rash   Shrimp [Shellfish Allergy] Anaphylaxis    Per patient "shrimp only"   Azactam [Aztreonam]     Hand swelling    Azelastine Hcl     Rash    Ciprofloxacin Other (See Comments)    dizziness   Claritin [Loratadine]     Irritability Nervousness    Methotrexate Derivatives    Nasacort [Triamcinolone]     Dizzy    Olopatadine Other (See Comments)    Pain and lethargy    Other    Sulfamethizole Other (See Comments)    unknown   Zantac [Ranitidine Hcl] Other (See Comments)    unknown   Claritin-D 12 Hour [Loratadine-Pseudoephedrine Er] Anxiety   Keflex [Cephalexin] Nausea And Vomiting    Tolerated Ancef   Penicillins Rash    Injection site reaction. Tolerated cefepime in past. Also reports tolerating a penicillin infusion after this initial rxn ~20 yrs ago.  Has patient had a PCN reaction causing immediate rash, facial/tongue/throat swelling, SOB or lightheadedness with hypotension: No Has patient had a PCN reaction causing severe rash involving mucus membranes or skin necrosis: No Has patient had a PCN  reaction that required hospitalization No Has patient had a PCN reaction occurring within the last 10 years: No     Sulfa Antibiotics Nausea And Vomiting    Outpatient Encounter Medications as of 12/27/2023  Medication Sig   acetaminophen (TYLENOL) 500 MG tablet Take 650 mg by mouth in the morning and at bedtime.   amLODipine (NORVASC) 5 MG tablet Take 5 mg by mouth daily.   atorvastatin (LIPITOR) 20 MG tablet TAKE 1 TABLET BY MOUTH EVERY DAY   Biotin 5 MG TBDP Take 1 tablet (5 mg total) by mouth daily.   Cholecalciferol (VITAMIN D3) 125 MCG (5000 UT) CAPS TAKE 1 CAPSULE BY MOUTH EVERY DAY   colchicine 0.6 MG tablet TAKE 1 TABLET BY MOUTH EVERY DAY   CRANBERRY PO Take 1 capsule by mouth 2 (two) times daily.   cycloSPORINE (RESTASIS) 0.05 % ophthalmic emulsion Place 1 drop into both eyes 2 (two) times daily.   D-Mannose 500 MG CAPS Take 500 mg by mouth daily.   diclofenac Sodium (VOLTAREN)  1 % GEL APPLY 2 TO 4 GRAMS TOPICALLY TO AFFECTED JOINTS UP TO 4 TIMES DAILY   fexofenadine (ALLEGRA) 180 MG tablet Take 1 tablet (180 mg total) by mouth daily.   ipratropium-albuterol (DUONEB) 0.5-2.5 (3) MG/3ML SOLN Take 3 mLs by nebulization as needed.   lansoprazole (PREVACID) 30 MG capsule TAKE 1 CAPSULE BY MOUTH ONCE DAILY AT NOON (Patient taking differently: Take 30 mg by mouth daily in the afternoon.)   montelukast (SINGULAIR) 10 MG tablet TAKE 1 TABLET BY MOUTH EVERYDAY AT BEDTIME (Patient taking differently: Take 10 mg by mouth at bedtime.)   ondansetron (ZOFRAN) 4 MG tablet Take 4 mg by mouth as needed for nausea or vomiting.   potassium chloride SA (KLOR-CON M) 20 MEQ tablet Take 40 mEq by mouth 2 (two) times daily.   predniSONE (DELTASONE) 5 MG tablet Take 5 mg by mouth daily with breakfast.   saccharomyces boulardii (FLORASTOR) 250 MG capsule Take 250 mg by mouth 2 (two) times daily.   Torsemide 40 MG TABS Take 40 mg by mouth daily.   traMADol (ULTRAM) 50 MG tablet Take 1 tablet (50 mg total)  by mouth every 6 (six) hours as needed (pain).   No facility-administered encounter medications on file as of 12/27/2023.    Review of Systems  Constitutional:  Negative for chills and fever.  Respiratory:  Negative for cough, shortness of breath and wheezing.   Cardiovascular:  Negative for chest pain, palpitations and leg swelling.  Gastrointestinal:  Negative for abdominal distention, abdominal pain, blood in stool, constipation, diarrhea, nausea and vomiting.  Genitourinary:  Negative for dysuria, frequency and urgency.  Musculoskeletal:  Positive for arthralgias.  Neurological:  Negative for dizziness, weakness and numbness.    There were no vitals filed for this visit. There is no height or weight on file to calculate BMI. Physical Exam Constitutional:      Appearance: Normal appearance.  HENT:     Head: Normocephalic and atraumatic.  Cardiovascular:     Rate and Rhythm: Normal rate and regular rhythm.  Pulmonary:     Effort: Pulmonary effort is normal. No respiratory distress.     Breath sounds: Normal breath sounds. No wheezing.  Abdominal:     General: Bowel sounds are normal. There is no distension.     Tenderness: There is no abdominal tenderness. There is no guarding or rebound.     Comments:    Musculoskeletal:        General: No swelling or tenderness.     Comments: Rt ankle - no redness, no swelling No warmth  Rom intact   Neurological:     Mental Status: She is alert. Mental status is at baseline.     Sensory: No sensory deficit.     Motor: No weakness.     Labs reviewed: Basic Metabolic Panel: Recent Labs    12/21/23 1639 12/22/23 0338 12/24/23 0346 12/25/23 0356 12/26/23 0418  NA  --    < > 140 139 140  K  --    < > 3.2* 3.3* 3.2*  CL  --    < > 113* 110 107  CO2  --    < > 23 23 25   GLUCOSE  --    < > 85 83 87  BUN  --    < > 15 11 7*  CREATININE 1.07*   < > 0.84 0.78 0.77  CALCIUM  --    < > 8.8* 8.6* 8.9  MG 1.6*  --  2.3 2.2 2.1  PHOS  3.6  --   --   --   --    < > = values in this interval not displayed.   Liver Function Tests: Recent Labs    12/21/23 0900 12/22/23 0338 12/26/23 0418  AST 50* 45* 52*  ALT 28 26 57*  ALKPHOS 75 57 61  BILITOT 0.8 0.6 0.8  PROT 7.0 5.2* 6.1*  ALBUMIN 3.3* 2.5* 2.8*   Recent Labs    12/21/23 1639  LIPASE 20   Recent Labs    12/21/23 1130  AMMONIA 23   CBC: Recent Labs    12/21/23 0900 12/21/23 1639 12/22/23 0338 12/24/23 0346 12/26/23 0418  WBC 9.9   < > 7.5 8.1 10.0  NEUTROABS 6.8  --   --  3.5 4.7  HGB 13.7   < > 10.6* 10.3* 11.6*  HCT 44.7   < > 34.1* 32.8* 37.1  MCV 94.1   < > 93.9 92.4 92.8  PLT 226   < > 169 156 188   < > = values in this interval not displayed.   Cardiac Enzymes: No results for input(s): "CKTOTAL", "CKMB", "CKMBINDEX", "TROPONINI" in the last 8760 hours. BNP: Invalid input(s): "POCBNP" Lab Results  Component Value Date   HGBA1C 6.5 11/28/2023   Lab Results  Component Value Date   TSH 0.865 02/27/2022   Lab Results  Component Value Date   VITAMINB12 1,377 (H) 05/26/2022   Lab Results  Component Value Date   FOLATE 26.8 02/06/2018   Lab Results  Component Value Date   IRON 89 02/06/2018   TIBC 281 02/06/2018   FERRITIN 74 02/06/2018    Imaging and Procedures obtained prior to SNF admission: ECHOCARDIOGRAM COMPLETE Result Date: 12/21/2023    ECHOCARDIOGRAM REPORT   Patient Name:   Eileen Wilkerson Date of Exam: 12/21/2023 Medical Rec #:  [id number]          Height:       64.0 in Accession #:    [id number]         Weight:       162.0 lb Date of Birth:  1932-08-01          BSA:          1.789 m Patient Age:    89 years           BP:           97/58 mmHg Patient Gender: F                  HR:           76 bpm. Exam Location:  Inpatient Procedure: 2D Echo, Cardiac Doppler and Color Doppler STAT ECHO Indications:    Shock  History:        Patient has prior history of Echocardiogram examinations, most                 recent  05/22/2022. CHF, Arrythmias:Tachycardia,                 Signs/Symptoms:Shortness of Breath; Risk Factors:Dyslipidemia                 and Hypertension.  Sonographer:    Vern Claude Referring Phys: [id number] Bettina Gavia DEWALD IMPRESSIONS  1. Left ventricular ejection fraction, by estimation, is 55 to 60%. The left ventricle has normal function. The left ventricle has no regional wall motion abnormalities. Left ventricular diastolic parameters are consistent with Grade I  diastolic dysfunction (impaired relaxation).  2. Right ventricular systolic function is normal. The right ventricular size is normal. Tricuspid regurgitation signal is inadequate for assessing PA pressure.  3. The mitral valve is normal in structure. No evidence of mitral valve regurgitation. No evidence of mitral stenosis.  4. The aortic valve is tricuspid. There is mild calcification of the aortic valve. Aortic valve regurgitation is not visualized. No aortic stenosis is present.  5. The inferior vena cava is normal in size with greater than 50% respiratory variability, suggesting right atrial pressure of 3 mmHg. FINDINGS  Left Ventricle: Left ventricular ejection fraction, by estimation, is 55 to 60%. The left ventricle has normal function. The left ventricle has no regional wall motion abnormalities. The left ventricular internal cavity size was normal in size. There is  no left ventricular hypertrophy. Left ventricular diastolic parameters are consistent with Grade I diastolic dysfunction (impaired relaxation). Right Ventricle: The right ventricular size is normal. No increase in right ventricular wall thickness. Right ventricular systolic function is normal. Tricuspid regurgitation signal is inadequate for assessing PA pressure. Left Atrium: Left atrial size was normal in size. Right Atrium: Right atrial size was normal in size. Pericardium: There is no evidence of pericardial effusion. Mitral Valve: The mitral valve is normal in structure. No  evidence of mitral valve regurgitation. No evidence of mitral valve stenosis. MV peak gradient, 2.5 mmHg. The mean mitral valve gradient is 1.0 mmHg. Tricuspid Valve: The tricuspid valve is normal in structure. Tricuspid valve regurgitation is not demonstrated. Aortic Valve: The aortic valve is tricuspid. There is mild calcification of the aortic valve. Aortic valve regurgitation is not visualized. No aortic stenosis is present. Aortic valve mean gradient measures 6.0 mmHg. Aortic valve peak gradient measures 11.4 mmHg. Aortic valve area, by VTI measures 2.06 cm. Pulmonic Valve: The pulmonic valve was normal in structure. Pulmonic valve regurgitation is not visualized. Aorta: The aortic root is normal in size and structure. Venous: The inferior vena cava is normal in size with greater than 50% respiratory variability, suggesting right atrial pressure of 3 mmHg. IAS/Shunts: No atrial level shunt detected by color flow Doppler.  LEFT VENTRICLE PLAX 2D LVIDd:         3.90 cm      Diastology LVIDs:         3.00 cm      LV e' medial:    6.85 cm/s LV PW:         0.70 cm      LV E/e' medial:  9.8 LV IVS:        0.80 cm      LV e' lateral:   8.05 cm/s LVOT diam:     1.80 cm      LV E/e' lateral: 8.3 LV SV:         64 LV SV Index:   36 LVOT Area:     2.54 cm  LV Volumes (MOD) LV vol d, MOD A2C: 104.0 ml LV vol d, MOD A4C: 112.0 ml LV vol s, MOD A2C: 34.3 ml LV vol s, MOD A4C: 50.3 ml LV SV MOD A2C:     69.7 ml LV SV MOD A4C:     112.0 ml LV SV MOD BP:      67.4 ml RIGHT VENTRICLE             IVC RV Basal diam:  3.80 cm     IVC diam: 1.70 cm RV Mid diam:    3.70 cm RV  S prime:     13.50 cm/s TAPSE (M-mode): 2.6 cm LEFT ATRIUM             Index        RIGHT ATRIUM          Index LA diam:        3.10 cm 1.73 cm/m   RA Area:     9.11 cm LA Vol (A2C):   22.4 ml 12.52 ml/m  RA Volume:   17.40 ml 9.73 ml/m LA Vol (A4C):   25.1 ml 14.03 ml/m LA Biplane Vol: 25.9 ml 14.48 ml/m  AORTIC VALVE                     PULMONIC VALVE  AV Area (Vmax):    2.02 cm      PV Vmax:       0.78 m/s AV Area (Vmean):   1.51 cm      PV Peak grad:  2.4 mmHg AV Area (VTI):     2.06 cm AV Vmax:           169.00 cm/s AV Vmean:          119.000 cm/s AV VTI:            0.309 m AV Peak Grad:      11.4 mmHg AV Mean Grad:      6.0 mmHg LVOT Vmax:         134.00 cm/s LVOT Vmean:        70.800 cm/s LVOT VTI:          0.250 m LVOT/AV VTI ratio: 0.81  AORTA Ao Root diam: 2.80 cm Ao Asc diam:  2.90 cm MITRAL VALVE MV Area (PHT): 3.12 cm    SHUNTS MV Area VTI:   2.37 cm    Systemic VTI:  0.25 m MV Peak grad:  2.5 mmHg    Systemic Diam: 1.80 cm MV Mean grad:  1.0 mmHg MV Vmax:       0.79 m/s MV Vmean:      51.8 cm/s MV Decel Time: 243 msec MV E velocity: 67.00 cm/s MV A velocity: 62.90 cm/s MV E/A ratio:  1.07 Dalton McleanMD Electronically signed by Wilfred Lacy Signature Date/Time: 12/21/2023/3:10:49 PM    Final    DG Hip Unilat W or Wo Pelvis 2-3 Views Left Result Date: 12/21/2023 CLINICAL DATA:  Left hip pain. EXAM: DG HIP (WITH OR WITHOUT PELVIS) 2-3V LEFT COMPARISON:  None Available. FINDINGS: There is no acute fracture or dislocation.Evaluation for fracture is limited due to osteopenia and body habitus. There is moderate left and severe right hip osteoarthritic changes. There is near complete loss of right hip joint space with near bone on bone contact. Excreted contrast noted in the urinary bladder. The soft tissues are unremarkable. IMPRESSION: 1. No acute fracture or dislocation. 2. Moderate left and severe right hip osteoarthritis. Electronically Signed   By: Elgie Collard M.D.   On: 12/21/2023 12:38   CT Angio Chest PE W and/or Wo Contrast Result Date: 12/21/2023 CLINICAL DATA:  Pulmonary embolism (PE) suspected, high prob; Epigastric pain sepsis. Nausea and lethargy. EXAM: CT ANGIOGRAPHY CHEST CT ABDOMEN AND PELVIS WITH CONTRAST TECHNIQUE: Multidetector CT imaging of the chest was performed using the standard protocol during bolus administration  of intravenous contrast. Multiplanar CT image reconstructions and MIPs were obtained to evaluate the vascular anatomy. Multidetector CT imaging of the abdomen and pelvis was performed using the standard protocol during bolus  administration of intravenous contrast. RADIATION DOSE REDUCTION: This exam was performed according to the departmental dose-optimization program which includes automated exposure control, adjustment of the mA and/or kV according to patient size and/or use of iterative reconstruction technique. CONTRAST:  75mL OMNIPAQUE IOHEXOL 350 MG/ML SOLN COMPARISON:  CT scan abdomen and pelvis from 02/12/2023. FINDINGS: CTA CHEST FINDINGS Cardiovascular: No evidence of embolism to the proximal subsegmental pulmonary artery level. Normal cardiac size. No pericardial effusion. No aortic aneurysm. There are coronary artery calcifications, in keeping with coronary artery disease. Mediastinum/Nodes: Visualized thyroid gland appears grossly unremarkable. No solid / cystic mediastinal masses. The esophagus is nondistended precluding optimal assessment. There are few mildly prominent mediastinal and hilar lymph nodes, which do not meet the size criteria for lymphadenopathy. No axillary lymphadenopathy by size criteria. Lungs/Pleura: The central tracheo-bronchial tree is patent. There is mild, smooth, circumferential thickening of the segmental and subsegmental bronchial walls, throughout bilateral lungs, which is nonspecific. Findings are most commonly seen with bronchitis or reactive airway disease, such as asthma. There are occlusive and nonocclusive filling defects in the distal segmental and subsegmental bronchi, throughout bilateral lungs, likely due to mucus/secretions or aspiration. There are multiple solid noncalcified nodules throughout bilateral lungs with largest in the anterior left upper lobe measuring up to 1.8 Wilkerson 1.8 cm. This nodule exhibits small central cavitation. Rest of the nodules are smaller in  size and do not exhibit cavitations. These are compatible with metastases. No consolidation, pleural effusion or pneumothorax. There are dependent changes in bilateral lungs. Musculoskeletal: The visualized soft tissues of the chest wall are grossly unremarkable. No suspicious osseous lesions. There are mild multilevel degenerative changes in the visualized spine. Review of the MIP images confirms the above findings. CT ABDOMEN and PELVIS FINDINGS Hepatobiliary: The liver is normal in size. Non-cirrhotic configuration. No suspicious mass. No intrahepatic or extrahepatic bile duct dilation. No calcified gallstones. Normal gallbladder wall thickness. No pericholecystic inflammatory changes. Pancreas: Unremarkable. No pancreatic ductal dilatation or surrounding inflammatory changes. Spleen: Within normal limits. No focal lesion. Adrenals/Urinary Tract: Adrenal glands are unremarkable. No suspicious renal mass. There is a 2.3 Wilkerson 2.4 cm simple cyst in the left kidney interpolar region, anteriorly. There are additional several smaller cysts throughout bilateral kidneys. No hydronephrosis. No renal or ureteric calculi. Unremarkable urinary bladder. Stomach/Bowel: There is a small sliding hiatal hernia. No disproportionate dilation of the small or large bowel loops. No evidence of abnormal bowel wall thickening or inflammatory changes. The appendix is unremarkable. There are scattered diverticula mainly in the sigmoid colon, without imaging signs of diverticulitis. Vascular/Lymphatic: No ascites or pneumoperitoneum. No abdominal or pelvic lymphadenopathy, by size criteria. No aneurysmal dilation of the major abdominal arteries. There are mild peripheral atherosclerotic vascular calcifications of the aorta and its major branches. Reproductive: The uterus is unremarkable. There is a 3.5 Wilkerson 4.3 cm hypoattenuating structure in the left adnexa, most likely ovarian in etiology. The structure exhibits fluid attenuation and has no  discrete mural nodule. Even though this is incompletely characterized on the current exam, favored to represent a senescent ovarian cyst. Annual follow-up ultrasound evaluation is recommended to documenting stability. Other: There is a tiny fat containing umbilical hernia. The soft tissues and abdominal wall are otherwise unremarkable. Musculoskeletal: No suspicious osseous lesions. There are moderate multilevel degenerative changes in the visualized spine. Review of the MIP images confirms the above findings. IMPRESSION: 1. No embolism to the proximal subsegmental pulmonary artery level. 2. There are multiple solid noncalcified lung nodules, compatible with metastases. 3.  No acute inflammatory process identified within the abdomen or pelvis. 4. Multiple other nonacute observations, as described above. Electronically Signed   By: Jules Schick M.D.   On: 12/21/2023 11:18   CT ABDOMEN PELVIS W CONTRAST Result Date: 12/21/2023 CLINICAL DATA:  Pulmonary embolism (PE) suspected, high prob; Epigastric pain sepsis. Nausea and lethargy. EXAM: CT ANGIOGRAPHY CHEST CT ABDOMEN AND PELVIS WITH CONTRAST TECHNIQUE: Multidetector CT imaging of the chest was performed using the standard protocol during bolus administration of intravenous contrast. Multiplanar CT image reconstructions and MIPs were obtained to evaluate the vascular anatomy. Multidetector CT imaging of the abdomen and pelvis was performed using the standard protocol during bolus administration of intravenous contrast. RADIATION DOSE REDUCTION: This exam was performed according to the departmental dose-optimization program which includes automated exposure control, adjustment of the mA and/or kV according to patient size and/or use of iterative reconstruction technique. CONTRAST:  75mL OMNIPAQUE IOHEXOL 350 MG/ML SOLN COMPARISON:  CT scan abdomen and pelvis from 02/12/2023. FINDINGS: CTA CHEST FINDINGS Cardiovascular: No evidence of embolism to the proximal  subsegmental pulmonary artery level. Normal cardiac size. No pericardial effusion. No aortic aneurysm. There are coronary artery calcifications, in keeping with coronary artery disease. Mediastinum/Nodes: Visualized thyroid gland appears grossly unremarkable. No solid / cystic mediastinal masses. The esophagus is nondistended precluding optimal assessment. There are few mildly prominent mediastinal and hilar lymph nodes, which do not meet the size criteria for lymphadenopathy. No axillary lymphadenopathy by size criteria. Lungs/Pleura: The central tracheo-bronchial tree is patent. There is mild, smooth, circumferential thickening of the segmental and subsegmental bronchial walls, throughout bilateral lungs, which is nonspecific. Findings are most commonly seen with bronchitis or reactive airway disease, such as asthma. There are occlusive and nonocclusive filling defects in the distal segmental and subsegmental bronchi, throughout bilateral lungs, likely due to mucus/secretions or aspiration. There are multiple solid noncalcified nodules throughout bilateral lungs with largest in the anterior left upper lobe measuring up to 1.8 Wilkerson 1.8 cm. This nodule exhibits small central cavitation. Rest of the nodules are smaller in size and do not exhibit cavitations. These are compatible with metastases. No consolidation, pleural effusion or pneumothorax. There are dependent changes in bilateral lungs. Musculoskeletal: The visualized soft tissues of the chest wall are grossly unremarkable. No suspicious osseous lesions. There are mild multilevel degenerative changes in the visualized spine. Review of the MIP images confirms the above findings. CT ABDOMEN and PELVIS FINDINGS Hepatobiliary: The liver is normal in size. Non-cirrhotic configuration. No suspicious mass. No intrahepatic or extrahepatic bile duct dilation. No calcified gallstones. Normal gallbladder wall thickness. No pericholecystic inflammatory changes. Pancreas:  Unremarkable. No pancreatic ductal dilatation or surrounding inflammatory changes. Spleen: Within normal limits. No focal lesion. Adrenals/Urinary Tract: Adrenal glands are unremarkable. No suspicious renal mass. There is a 2.3 Wilkerson 2.4 cm simple cyst in the left kidney interpolar region, anteriorly. There are additional several smaller cysts throughout bilateral kidneys. No hydronephrosis. No renal or ureteric calculi. Unremarkable urinary bladder. Stomach/Bowel: There is a small sliding hiatal hernia. No disproportionate dilation of the small or large bowel loops. No evidence of abnormal bowel wall thickening or inflammatory changes. The appendix is unremarkable. There are scattered diverticula mainly in the sigmoid colon, without imaging signs of diverticulitis. Vascular/Lymphatic: No ascites or pneumoperitoneum. No abdominal or pelvic lymphadenopathy, by size criteria. No aneurysmal dilation of the major abdominal arteries. There are mild peripheral atherosclerotic vascular calcifications of the aorta and its major branches. Reproductive: The uterus is unremarkable. There is a 3.5 Wilkerson  4.3 cm hypoattenuating structure in the left adnexa, most likely ovarian in etiology. The structure exhibits fluid attenuation and has no discrete mural nodule. Even though this is incompletely characterized on the current exam, favored to represent a senescent ovarian cyst. Annual follow-up ultrasound evaluation is recommended to documenting stability. Other: There is a tiny fat containing umbilical hernia. The soft tissues and abdominal wall are otherwise unremarkable. Musculoskeletal: No suspicious osseous lesions. There are moderate multilevel degenerative changes in the visualized spine. Review of the MIP images confirms the above findings. IMPRESSION: 1. No embolism to the proximal subsegmental pulmonary artery level. 2. There are multiple solid noncalcified lung nodules, compatible with metastases. 3. No acute inflammatory  process identified within the abdomen or pelvis. 4. Multiple other nonacute observations, as described above. Electronically Signed   By: Jules Schick M.D.   On: 12/21/2023 11:18   DG Chest Port 1 View Result Date: 12/21/2023 CLINICAL DATA:  89 year old female with possible sepsis. EXAM: PORTABLE CHEST 1 VIEW COMPARISON:  Portable chest 02/12/2023 and earlier. FINDINGS: Portable AP semi upright view at 0844 hours. Low lung volumes not significantly changed from last year. Stable cardiac size and mediastinal contours. Visualized tracheal air column is within normal limits. Allowing for portable technique the lungs are clear. Chronic cervical ACDF. Stable visualized osseous structures. Negative visible bowel gas. IMPRESSION: Stable with chronically low lung volumes. No acute cardiopulmonary abnormality. Electronically Signed   By: Odessa Fleming M.D.   On: 12/21/2023 09:02    Assessment/Plan  Rt ankle pain  Will start tylenol 650 mg q8 prn  Will start lidocaine patch   History of adrenal insufficiency History of rheumatoid arthritis Resume prednisone 5 mg Plan refer to endocrinology  Lung nodules Patient had a CT scan in the hospital which showed multiple noncalcified lung nodules Will refer to pulmonology Family wants to pursue with pulmonology appt   Left ovarian cyst Will check with family if they are interested in with ultrasound  AKI on CKD Avoid nephrotoxic medications BMP ordered on admission to skilled care.  Hypokalemia Will check BMP  Hypertension Continue with amlodipine    Care plan discussed with the nursing staff   35 min Total time spent for obtaining history,  performing a medically appropriate examination and evaluation, reviewing the tests,ordering  tests,  documenting clinical information in the electronic or other health record,  ,care coordination (not separately reported)

## 2023-12-31 ENCOUNTER — Encounter: Payer: Self-pay | Admitting: Sports Medicine

## 2024-01-07 ENCOUNTER — Encounter: Payer: Self-pay | Admitting: Sports Medicine

## 2024-01-07 ENCOUNTER — Non-Acute Institutional Stay (SKILLED_NURSING_FACILITY): Payer: Medicare PPO | Admitting: Sports Medicine

## 2024-01-07 DIAGNOSIS — R918 Other nonspecific abnormal finding of lung field: Secondary | ICD-10-CM

## 2024-01-07 DIAGNOSIS — M1A9XX0 Chronic gout, unspecified, without tophus (tophi): Secondary | ICD-10-CM | POA: Diagnosis not present

## 2024-01-07 DIAGNOSIS — E2740 Unspecified adrenocortical insufficiency: Secondary | ICD-10-CM | POA: Diagnosis not present

## 2024-01-07 NOTE — Progress Notes (Signed)
Location:   [facility], dispensing of Service:   Skilled care   Provider: Venita Sheffield  PCP: Mast, [name], NP [name], [name], [name]  Extended Emergency Contact Information Primary Emergency Contact: [name] [place] of [place] Home Phone: [phone] Mobile Phone: [phone] Relation: Daughter    Goals of care:  Advanced Directive information    12/21/2023    8:00 PM  Advanced Directives  Does Patient Have a Medical Advance Directive? Yes  Type of Estate agent of [place];Living will  Does patient want to make changes to medical advance directive? No - Patient declined  Copy of Healthcare Power of Attorney in Chart? Yes - validated most recent copy scanned in chart (See row information)     Allergies  Allergen Reactions   Adhesive [Tape] Rash   Celebrex [Celecoxib] Hives   Ciprofibrate Nausea Only   Cymbalta [Duloxetine Hcl] Swelling   Gabitril [Tiagabine] Swelling   Lyrica [Pregabalin] Swelling   Neurontin [Gabapentin] Swelling   Nexium [Esomeprazole] Rash   Nsaids Rash   Shrimp [Shellfish Allergy] Anaphylaxis    Per patient "shrimp only"   Azactam [Aztreonam]     Hand swelling    Azelastine Hcl     Rash    Ciprofloxacin Other (See Comments)    dizziness   Claritin [Loratadine]     Irritability Nervousness    Methotrexate Derivatives    Nasacort [Triamcinolone]     Dizzy    Olopatadine Other (See Comments)    Pain and lethargy    Other    Sulfamethizole Other (See Comments)    unknown   Zantac [Ranitidine Hcl] Other (See Comments)    unknown   Claritin-D 12 Hour [Loratadine-Pseudoephedrine Er] Anxiety   Keflex [Cephalexin] Nausea And Vomiting    Tolerated Ancef   Penicillins Rash    Injection site reaction. Tolerated cefepime in past. Also reports tolerating a penicillin infusion after this initial rxn ~20 yrs ago.  Has patient had a PCN  reaction causing immediate rash, facial/tongue/throat swelling, SOB or lightheadedness with hypotension: No Has patient had a PCN reaction causing severe rash involving mucus membranes or skin necrosis: No Has patient had a PCN reaction that required hospitalization No Has patient had a PCN reaction occurring within the last 10 years: No     Sulfa Antibiotics Nausea And Vomiting    No chief complaint on file.   HPI:  89 y.o. female  with h/o Gout, adrenal insufficiency , lung nodules admitted to Skilled care after her recent hospitalization for sepsis.  Pt seen and examined in her room. Pt seems pleasant and comfortable and does not appear to be in distress. She is sitting in the power scooter. She is able to transfer herself from bed to chair. Can ambulate with walker for short distances and uses power scooter for long distances.  Pt c/o intermittent achy pain in her Rt ankle Denies pain with movements  Denies fevers, cough, sob, abdominal pain, nausea, vomiting, dysuria, hematuria, bloody or dark stools. Last bm yesterday.     Past Medical History:  Diagnosis Date   Adrenal failure (HCC)    Arthritis    Asthma    Cancer (HCC)    Cataract    Closed nondisplaced fracture of fifth right metatarsal bone 09/18/2017   Diverticulitis    Per patient   E coli bacteremia    Fibromyalgia 2008   GERD (gastroesophageal reflux  disease) 12/18/2016   HCAP (healthcare-associated pneumonia) 02/03/2018   Hyperlipidemia 10/13/2021   Osteoporosis    RA (rheumatoid arthritis) (HCC)    Recurrent upper respiratory infection (URI)    Sepsis due to urinary tract infection (HCC) 01/18/2018   Urticaria     Past Surgical History:  Procedure Laterality Date   BACK SURGERY     BILATERAL CARPAL TUNNEL RELEASE  2005   right and left   CATARACT EXTRACTION, BILATERAL  2004   right and left   CERVICAL FUSION  2011,2010,2008   2 disks   HEEL SPUR SURGERY  2004   lower back fusion  2011    Fusion of 3-4 and 4-5 lower back   RADIOFREQUENCY ABLATION  2020   ROTATOR CUFF REPAIR  [id number]   SQUAMOUS CELL CARCINOMA EXCISION     TONSILLECTOMY AND ADENOIDECTOMY  1947   TOTAL SHOULDER ARTHROPLASTY        reports that she quit smoking about 57 years ago. Her smoking use included cigarettes. She started smoking about 67 years ago. She has a 7.5 pack-year smoking history. She has never been exposed to tobacco smoke. She has never used smokeless tobacco. She reports that she does not drink alcohol and does not use drugs. Social History   Socioeconomic History   Marital status: Widowed    Spouse name: Not on file   Number of children: 2   Years of education: Not on file   Highest education level: Not on file  Occupational History   Occupation: Retired Child psychotherapist  Tobacco Use   Smoking status: Former    Current packs/day: 0.00    Average packs/day: 0.8 packs/day for 10.0 years (7.5 ttl pk-yrs)    Types: Cigarettes    Start date: 26    Quit date: 1968    Years since quitting: 57.1    Passive exposure: Never   Smokeless tobacco: Never  Vaping Use   Vaping status: Never Used  Substance and Sexual Activity   Alcohol use: No   Drug use: No   Sexual activity: Not Currently  Other Topics Concern   Not on file  Social History Narrative      Diet:        Do you drink/ eat things with caffeine? Dr. Reino Kent 2/ day      Marital status: Widowed                              What year were you married ? 1953      Do you live in a house, apartment,assistred living, condo, trailer, etc.)? Apartment      Is it one or more stories?       How many persons live in your home ? 1      Do you have any pets in your home ?(please list) No      Highest Level of education completed: PhD       Current or past profession: Paramedic, Child psychotherapist, Special Educator       Do you exercise?   No                           Type & how often       ADVANCED DIRECTIVES (Please bring copies)       Do you have a living will? Yes      Do you have a DNR form?  If not, do you want to discuss one? Yes      Do you have signed POA?HPOA forms?                 If so, please bring to your appointment Tes      FUNCTIONAL STATUS- To be completed by Spouse / child / Staff       Do you have difficulty bathing or dressing yourself ? No      Do you have difficulty preparing food or eating ? No      Do you have difficulty managing your mediation ? No      Do you have difficulty managing your finances ? No      Do you have difficulty affording your medication ? No      Social Drivers of Corporate investment banker Strain: Not on file  Food Insecurity: No Food Insecurity (12/21/2023)   Hunger Vital Sign    Worried About Running Out of Food in the Last Year: Never true    Ran Out of Food in the Last Year: Never true  Transportation Needs: No Transportation Needs (12/21/2023)   PRAPARE - Administrator, Civil Service (Medical): No    Lack of Transportation (Non-Medical): No  Physical Activity: Not on file  Stress: Not on file  Social Connections: Not on file  Intimate Partner Violence: Not At Risk (12/21/2023)   Humiliation, Afraid, Rape, and Kick questionnaire    Fear of Current or Ex-Partner: No    Emotionally Abused: No    Physically Abused: No    Sexually Abused: No   Functional Status Survey:    Allergies  Allergen Reactions   Adhesive [Tape] Rash   Celebrex [Celecoxib] Hives   Ciprofibrate Nausea Only   Cymbalta [Duloxetine Hcl] Swelling   Gabitril [Tiagabine] Swelling   Lyrica [Pregabalin] Swelling   Neurontin [Gabapentin] Swelling   Nexium [Esomeprazole] Rash   Nsaids Rash   Shrimp [Shellfish Allergy] Anaphylaxis    Per patient "shrimp only"   Azactam [Aztreonam]     Hand swelling    Azelastine Hcl     Rash    Ciprofloxacin Other (See Comments)    dizziness   Claritin [Loratadine]     Irritability Nervousness     Methotrexate Derivatives    Nasacort [Triamcinolone]     Dizzy    Olopatadine Other (See Comments)    Pain and lethargy    Other    Sulfamethizole Other (See Comments)    unknown   Zantac [Ranitidine Hcl] Other (See Comments)    unknown   Claritin-D 12 Hour [Loratadine-Pseudoephedrine Er] Anxiety   Keflex [Cephalexin] Nausea And Vomiting    Tolerated Ancef   Penicillins Rash    Injection site reaction. Tolerated cefepime in past. Also reports tolerating a penicillin infusion after this initial rxn ~20 yrs ago.  Has patient had a PCN reaction causing immediate rash, facial/tongue/throat swelling, SOB or lightheadedness with hypotension: No Has patient had a PCN reaction causing severe rash involving mucus membranes or skin necrosis: No Has patient had a PCN reaction that required hospitalization No Has patient had a PCN reaction occurring within the last 10 years: No     Sulfa Antibiotics Nausea And Vomiting    Pertinent  Health Maintenance Due  Topic Date Due   INFLUENZA VACCINE  Completed   DEXA SCAN  Completed    Medications: Outpatient Encounter Medications as of 01/07/2024  Medication Sig   acetaminophen (TYLENOL) 500  MG tablet Take 650 mg by mouth in the morning and at bedtime.   amLODipine (NORVASC) 5 MG tablet Take 5 mg by mouth daily.   atorvastatin (LIPITOR) 20 MG tablet TAKE 1 TABLET BY MOUTH EVERY DAY   Biotin 5 MG TBDP Take 1 tablet (5 mg total) by mouth daily.   Cholecalciferol (VITAMIN D3) 125 MCG (5000 UT) CAPS TAKE 1 CAPSULE BY MOUTH EVERY DAY   colchicine 0.6 MG tablet TAKE 1 TABLET BY MOUTH EVERY DAY   CRANBERRY PO Take 1 capsule by mouth 2 (two) times daily.   cycloSPORINE (RESTASIS) 0.05 % ophthalmic emulsion Place 1 drop into both eyes 2 (two) times daily.   D-Mannose 500 MG CAPS Take 500 mg by mouth daily.   diclofenac Sodium (VOLTAREN) 1 % GEL APPLY 2 TO 4 GRAMS TOPICALLY TO AFFECTED JOINTS UP TO 4 TIMES DAILY   fexofenadine (ALLEGRA) 180 MG tablet  Take 1 tablet (180 mg total) by mouth daily.   ipratropium-albuterol (DUONEB) 0.5-2.5 (3) MG/3ML SOLN Take 3 mLs by nebulization as needed.   lansoprazole (PREVACID) 30 MG capsule TAKE 1 CAPSULE BY MOUTH ONCE DAILY AT NOON (Patient taking differently: Take 30 mg by mouth daily in the afternoon.)   montelukast (SINGULAIR) 10 MG tablet TAKE 1 TABLET BY MOUTH EVERYDAY AT BEDTIME (Patient taking differently: Take 10 mg by mouth at bedtime.)   ondansetron (ZOFRAN) 4 MG tablet Take 4 mg by mouth as needed for nausea or vomiting.   potassium chloride SA (KLOR-CON M) 20 MEQ tablet Take 40 mEq by mouth 2 (two) times daily.   predniSONE (DELTASONE) 5 MG tablet Take 5 mg by mouth daily with breakfast.   saccharomyces boulardii (FLORASTOR) 250 MG capsule Take 250 mg by mouth 2 (two) times daily.   Torsemide 40 MG TABS Take 40 mg by mouth daily.   traMADol (ULTRAM) 50 MG tablet Take 1 tablet (50 mg total) by mouth every 6 (six) hours as needed (pain).   No facility-administered encounter medications on file as of 01/07/2024.    Review of Systems  Constitutional:  Negative for fever.  Respiratory:  Negative for cough, shortness of breath and wheezing.   Cardiovascular:  Negative for chest pain and leg swelling.  Gastrointestinal:  Negative for abdominal distention, abdominal pain, blood in stool, constipation, diarrhea, nausea and vomiting.  Genitourinary:  Negative for dysuria, frequency and urgency.  Musculoskeletal:  Positive for arthralgias.  Neurological:  Negative for dizziness, weakness and numbness.    There were no vitals filed for this visit. There is no height or weight on file to calculate BMI. Physical Exam Constitutional:      Appearance: Normal appearance.  HENT:     Head: Normocephalic and atraumatic.  Cardiovascular:     Rate and Rhythm: Normal rate and regular rhythm.  Pulmonary:     Effort: Pulmonary effort is normal. No respiratory distress.     Breath sounds: Normal breath  sounds. No wheezing.  Abdominal:     General: Bowel sounds are normal. There is no distension.     Tenderness: There is no abdominal tenderness. There is no guarding or rebound.     Comments:    Musculoskeletal:        General: No swelling or tenderness.     Comments: Rt ankle - no point tenderness ROM intact   Neurological:     Mental Status: She is alert. Mental status is at baseline.     Motor: No weakness.  Labs reviewed: Basic Metabolic Panel: Recent Labs    12/21/23 1639 12/22/23 0338 12/24/23 0346 12/25/23 0356 12/26/23 0418  NA  --    < > 140 139 140  K  --    < > 3.2* 3.3* 3.2*  CL  --    < > 113* 110 107  CO2  --    < > 23 23 25   GLUCOSE  --    < > 85 83 87  BUN  --    < > 15 11 7*  CREATININE 1.07*   < > 0.84 0.78 0.77  CALCIUM  --    < > 8.8* 8.6* 8.9  MG 1.6*  --  2.3 2.2 2.1  PHOS 3.6  --   --   --   --    < > = values in this interval not displayed.   Liver Function Tests: Recent Labs    12/21/23 0900 12/22/23 0338 12/26/23 0418  AST 50* 45* 52*  ALT 28 26 57*  ALKPHOS 75 57 61  BILITOT 0.8 0.6 0.8  PROT 7.0 5.2* 6.1*  ALBUMIN 3.3* 2.5* 2.8*   Recent Labs    12/21/23 1639  LIPASE 20   Recent Labs    12/21/23 1130  AMMONIA 23   CBC: Recent Labs    12/21/23 0900 12/21/23 1639 12/22/23 0338 12/24/23 0346 12/26/23 0418  WBC 9.9   < > 7.5 8.1 10.0  NEUTROABS 6.8  --   --  3.5 4.7  HGB 13.7   < > 10.6* 10.3* 11.6*  HCT 44.7   < > 34.1* 32.8* 37.1  MCV 94.1   < > 93.9 92.4 92.8  PLT 226   < > 169 156 188   < > = values in this interval not displayed.   Cardiac Enzymes: No results for input(s): "CKTOTAL", "CKMB", "CKMBINDEX", "TROPONINI" in the last 8760 hours. BNP: Invalid input(s): "POCBNP" CBG: Recent Labs    02/15/23 2045 02/16/23 0748 02/16/23 1113  GLUCAP 197* 132* 150*    Procedures and Imaging Studies During Stay: ECHOCARDIOGRAM COMPLETE Result Date: 12/21/2023    ECHOCARDIOGRAM REPORT   Patient Name:    Eileen Wilkerson Date of Exam: 12/21/2023 Medical Rec #:  [id number]          Height:       64.0 in Accession #:    [id number]         Weight:       162.0 lb Date of Birth:  1932-07-20          BSA:          1.789 m Patient Age:    89 years           BP:           97/58 mmHg Patient Gender: F                  HR:           76 bpm. Exam Location:  Inpatient Procedure: 2D Echo, Cardiac Doppler and Color Doppler STAT ECHO Indications:    Shock  History:        Patient has prior history of Echocardiogram examinations, most                 recent 05/22/2022. CHF, Arrythmias:Tachycardia,                 Signs/Symptoms:Shortness of Breath; Risk Factors:Dyslipidemia  and Hypertension.  Sonographer:    Vern Claude Referring Phys: [id number] Bettina Gavia DEWALD IMPRESSIONS  1. Left ventricular ejection fraction, by estimation, is 55 to 60%. The left ventricle has normal function. The left ventricle has no regional wall motion abnormalities. Left ventricular diastolic parameters are consistent with Grade I diastolic dysfunction (impaired relaxation).  2. Right ventricular systolic function is normal. The right ventricular size is normal. Tricuspid regurgitation signal is inadequate for assessing PA pressure.  3. The mitral valve is normal in structure. No evidence of mitral valve regurgitation. No evidence of mitral stenosis.  4. The aortic valve is tricuspid. There is mild calcification of the aortic valve. Aortic valve regurgitation is not visualized. No aortic stenosis is present.  5. The inferior vena cava is normal in size with greater than 50% respiratory variability, suggesting right atrial pressure of 3 mmHg. FINDINGS  Left Ventricle: Left ventricular ejection fraction, by estimation, is 55 to 60%. The left ventricle has normal function. The left ventricle has no regional wall motion abnormalities. The left ventricular internal cavity size was normal in size. There is  no left ventricular hypertrophy. Left  ventricular diastolic parameters are consistent with Grade I diastolic dysfunction (impaired relaxation). Right Ventricle: The right ventricular size is normal. No increase in right ventricular wall thickness. Right ventricular systolic function is normal. Tricuspid regurgitation signal is inadequate for assessing PA pressure. Left Atrium: Left atrial size was normal in size. Right Atrium: Right atrial size was normal in size. Pericardium: There is no evidence of pericardial effusion. Mitral Valve: The mitral valve is normal in structure. No evidence of mitral valve regurgitation. No evidence of mitral valve stenosis. MV peak gradient, 2.5 mmHg. The mean mitral valve gradient is 1.0 mmHg. Tricuspid Valve: The tricuspid valve is normal in structure. Tricuspid valve regurgitation is not demonstrated. Aortic Valve: The aortic valve is tricuspid. There is mild calcification of the aortic valve. Aortic valve regurgitation is not visualized. No aortic stenosis is present. Aortic valve mean gradient measures 6.0 mmHg. Aortic valve peak gradient measures 11.4 mmHg. Aortic valve area, by VTI measures 2.06 cm. Pulmonic Valve: The pulmonic valve was normal in structure. Pulmonic valve regurgitation is not visualized. Aorta: The aortic root is normal in size and structure. Venous: The inferior vena cava is normal in size with greater than 50% respiratory variability, suggesting right atrial pressure of 3 mmHg. IAS/Shunts: No atrial level shunt detected by color flow Doppler.  LEFT VENTRICLE PLAX 2D LVIDd:         3.90 cm      Diastology LVIDs:         3.00 cm      LV e' medial:    6.85 cm/s LV PW:         0.70 cm      LV E/e' medial:  9.8 LV IVS:        0.80 cm      LV e' lateral:   8.05 cm/s LVOT diam:     1.80 cm      LV E/e' lateral: 8.3 LV SV:         64 LV SV Index:   36 LVOT Area:     2.54 cm  LV Volumes (MOD) LV vol d, MOD A2C: 104.0 ml LV vol d, MOD A4C: 112.0 ml LV vol s, MOD A2C: 34.3 ml LV vol s, MOD A4C: 50.3 ml  LV SV MOD A2C:     69.7 ml LV SV MOD A4C:  112.0 ml LV SV MOD BP:      67.4 ml RIGHT VENTRICLE             IVC RV Basal diam:  3.80 cm     IVC diam: 1.70 cm RV Mid diam:    3.70 cm RV S prime:     13.50 cm/s TAPSE (M-mode): 2.6 cm LEFT ATRIUM             Index        RIGHT ATRIUM          Index LA diam:        3.10 cm 1.73 cm/m   RA Area:     9.11 cm LA Vol (A2C):   22.4 ml 12.52 ml/m  RA Volume:   17.40 ml 9.73 ml/m LA Vol (A4C):   25.1 ml 14.03 ml/m LA Biplane Vol: 25.9 ml 14.48 ml/m  AORTIC VALVE                     PULMONIC VALVE AV Area (Vmax):    2.02 cm      PV Vmax:       0.78 m/s AV Area (Vmean):   1.51 cm      PV Peak grad:  2.4 mmHg AV Area (VTI):     2.06 cm AV Vmax:           169.00 cm/s AV Vmean:          119.000 cm/s AV VTI:            0.309 m AV Peak Grad:      11.4 mmHg AV Mean Grad:      6.0 mmHg LVOT Vmax:         134.00 cm/s LVOT Vmean:        70.800 cm/s LVOT VTI:          0.250 m LVOT/AV VTI ratio: 0.81  AORTA Ao Root diam: 2.80 cm Ao Asc diam:  2.90 cm MITRAL VALVE MV Area (PHT): 3.12 cm    SHUNTS MV Area VTI:   2.37 cm    Systemic VTI:  0.25 m MV Peak grad:  2.5 mmHg    Systemic Diam: 1.80 cm MV Mean grad:  1.0 mmHg MV Vmax:       0.79 m/s MV Vmean:      51.8 cm/s MV Decel Time: 243 msec MV E velocity: 67.00 cm/s MV A velocity: 62.90 cm/s MV E/A ratio:  1.07 Dalton McleanMD Electronically signed by Wilfred Lacy Signature Date/Time: 12/21/2023/3:10:49 PM    Final    DG Hip Unilat W or Wo Pelvis 2-3 Views Left Result Date: 12/21/2023 CLINICAL DATA:  Left hip pain. EXAM: DG HIP (WITH OR WITHOUT PELVIS) 2-3V LEFT COMPARISON:  None Available. FINDINGS: There is no acute fracture or dislocation.Evaluation for fracture is limited due to osteopenia and body habitus. There is moderate left and severe right hip osteoarthritic changes. There is near complete loss of right hip joint space with near bone on bone contact. Excreted contrast noted in the urinary bladder. The soft tissues  are unremarkable. IMPRESSION: 1. No acute fracture or dislocation. 2. Moderate left and severe right hip osteoarthritis. Electronically Signed   By: Elgie Collard M.D.   On: 12/21/2023 12:38   CT Angio Chest PE W and/or Wo Contrast Result Date: 12/21/2023 CLINICAL DATA:  Pulmonary embolism (PE) suspected, high prob; Epigastric pain sepsis. Nausea and lethargy. EXAM: CT ANGIOGRAPHY CHEST CT ABDOMEN AND PELVIS  WITH CONTRAST TECHNIQUE: Multidetector CT imaging of the chest was performed using the standard protocol during bolus administration of intravenous contrast. Multiplanar CT image reconstructions and MIPs were obtained to evaluate the vascular anatomy. Multidetector CT imaging of the abdomen and pelvis was performed using the standard protocol during bolus administration of intravenous contrast. RADIATION DOSE REDUCTION: This exam was performed according to the departmental dose-optimization program which includes automated exposure control, adjustment of the mA and/or kV according to patient size and/or use of iterative reconstruction technique. CONTRAST:  75mL OMNIPAQUE IOHEXOL 350 MG/ML SOLN COMPARISON:  CT scan abdomen and pelvis from 02/12/2023. FINDINGS: CTA CHEST FINDINGS Cardiovascular: No evidence of embolism to the proximal subsegmental pulmonary artery level. Normal cardiac size. No pericardial effusion. No aortic aneurysm. There are coronary artery calcifications, in keeping with coronary artery disease. Mediastinum/Nodes: Visualized thyroid gland appears grossly unremarkable. No solid / cystic mediastinal masses. The esophagus is nondistended precluding optimal assessment. There are few mildly prominent mediastinal and hilar lymph nodes, which do not meet the size criteria for lymphadenopathy. No axillary lymphadenopathy by size criteria. Lungs/Pleura: The central tracheo-bronchial tree is patent. There is mild, smooth, circumferential thickening of the segmental and subsegmental bronchial  walls, throughout bilateral lungs, which is nonspecific. Findings are most commonly seen with bronchitis or reactive airway disease, such as asthma. There are occlusive and nonocclusive filling defects in the distal segmental and subsegmental bronchi, throughout bilateral lungs, likely due to mucus/secretions or aspiration. There are multiple solid noncalcified nodules throughout bilateral lungs with largest in the anterior left upper lobe measuring up to 1.8 x 1.8 cm. This nodule exhibits small central cavitation. Rest of the nodules are smaller in size and do not exhibit cavitations. These are compatible with metastases. No consolidation, pleural effusion or pneumothorax. There are dependent changes in bilateral lungs. Musculoskeletal: The visualized soft tissues of the chest wall are grossly unremarkable. No suspicious osseous lesions. There are mild multilevel degenerative changes in the visualized spine. Review of the MIP images confirms the above findings. CT ABDOMEN and PELVIS FINDINGS Hepatobiliary: The liver is normal in size. Non-cirrhotic configuration. No suspicious mass. No intrahepatic or extrahepatic bile duct dilation. No calcified gallstones. Normal gallbladder wall thickness. No pericholecystic inflammatory changes. Pancreas: Unremarkable. No pancreatic ductal dilatation or surrounding inflammatory changes. Spleen: Within normal limits. No focal lesion. Adrenals/Urinary Tract: Adrenal glands are unremarkable. No suspicious renal mass. There is a 2.3 x 2.4 cm simple cyst in the left kidney interpolar region, anteriorly. There are additional several smaller cysts throughout bilateral kidneys. No hydronephrosis. No renal or ureteric calculi. Unremarkable urinary bladder. Stomach/Bowel: There is a small sliding hiatal hernia. No disproportionate dilation of the small or large bowel loops. No evidence of abnormal bowel wall thickening or inflammatory changes. The appendix is unremarkable. There are  scattered diverticula mainly in the sigmoid colon, without imaging signs of diverticulitis. Vascular/Lymphatic: No ascites or pneumoperitoneum. No abdominal or pelvic lymphadenopathy, by size criteria. No aneurysmal dilation of the major abdominal arteries. There are mild peripheral atherosclerotic vascular calcifications of the aorta and its major branches. Reproductive: The uterus is unremarkable. There is a 3.5 x 4.3 cm hypoattenuating structure in the left adnexa, most likely ovarian in etiology. The structure exhibits fluid attenuation and has no discrete mural nodule. Even though this is incompletely characterized on the current exam, favored to represent a senescent ovarian cyst. Annual follow-up ultrasound evaluation is recommended to documenting stability. Other: There is a tiny fat containing umbilical hernia. The soft tissues and abdominal wall  are otherwise unremarkable. Musculoskeletal: No suspicious osseous lesions. There are moderate multilevel degenerative changes in the visualized spine. Review of the MIP images confirms the above findings. IMPRESSION: 1. No embolism to the proximal subsegmental pulmonary artery level. 2. There are multiple solid noncalcified lung nodules, compatible with metastases. 3. No acute inflammatory process identified within the abdomen or pelvis. 4. Multiple other nonacute observations, as described above. Electronically Signed   By: Jules Schick M.D.   On: 12/21/2023 11:18   CT ABDOMEN PELVIS W CONTRAST Result Date: 12/21/2023 CLINICAL DATA:  Pulmonary embolism (PE) suspected, high prob; Epigastric pain sepsis. Nausea and lethargy. EXAM: CT ANGIOGRAPHY CHEST CT ABDOMEN AND PELVIS WITH CONTRAST TECHNIQUE: Multidetector CT imaging of the chest was performed using the standard protocol during bolus administration of intravenous contrast. Multiplanar CT image reconstructions and MIPs were obtained to evaluate the vascular anatomy. Multidetector CT imaging of the abdomen  and pelvis was performed using the standard protocol during bolus administration of intravenous contrast. RADIATION DOSE REDUCTION: This exam was performed according to the departmental dose-optimization program which includes automated exposure control, adjustment of the mA and/or kV according to patient size and/or use of iterative reconstruction technique. CONTRAST:  75mL OMNIPAQUE IOHEXOL 350 MG/ML SOLN COMPARISON:  CT scan abdomen and pelvis from 02/12/2023. FINDINGS: CTA CHEST FINDINGS Cardiovascular: No evidence of embolism to the proximal subsegmental pulmonary artery level. Normal cardiac size. No pericardial effusion. No aortic aneurysm. There are coronary artery calcifications, in keeping with coronary artery disease. Mediastinum/Nodes: Visualized thyroid gland appears grossly unremarkable. No solid / cystic mediastinal masses. The esophagus is nondistended precluding optimal assessment. There are few mildly prominent mediastinal and hilar lymph nodes, which do not meet the size criteria for lymphadenopathy. No axillary lymphadenopathy by size criteria. Lungs/Pleura: The central tracheo-bronchial tree is patent. There is mild, smooth, circumferential thickening of the segmental and subsegmental bronchial walls, throughout bilateral lungs, which is nonspecific. Findings are most commonly seen with bronchitis or reactive airway disease, such as asthma. There are occlusive and nonocclusive filling defects in the distal segmental and subsegmental bronchi, throughout bilateral lungs, likely due to mucus/secretions or aspiration. There are multiple solid noncalcified nodules throughout bilateral lungs with largest in the anterior left upper lobe measuring up to 1.8 x 1.8 cm. This nodule exhibits small central cavitation. Rest of the nodules are smaller in size and do not exhibit cavitations. These are compatible with metastases. No consolidation, pleural effusion or pneumothorax. There are dependent changes in  bilateral lungs. Musculoskeletal: The visualized soft tissues of the chest wall are grossly unremarkable. No suspicious osseous lesions. There are mild multilevel degenerative changes in the visualized spine. Review of the MIP images confirms the above findings. CT ABDOMEN and PELVIS FINDINGS Hepatobiliary: The liver is normal in size. Non-cirrhotic configuration. No suspicious mass. No intrahepatic or extrahepatic bile duct dilation. No calcified gallstones. Normal gallbladder wall thickness. No pericholecystic inflammatory changes. Pancreas: Unremarkable. No pancreatic ductal dilatation or surrounding inflammatory changes. Spleen: Within normal limits. No focal lesion. Adrenals/Urinary Tract: Adrenal glands are unremarkable. No suspicious renal mass. There is a 2.3 x 2.4 cm simple cyst in the left kidney interpolar region, anteriorly. There are additional several smaller cysts throughout bilateral kidneys. No hydronephrosis. No renal or ureteric calculi. Unremarkable urinary bladder. Stomach/Bowel: There is a small sliding hiatal hernia. No disproportionate dilation of the small or large bowel loops. No evidence of abnormal bowel wall thickening or inflammatory changes. The appendix is unremarkable. There are scattered diverticula mainly in the sigmoid colon,  without imaging signs of diverticulitis. Vascular/Lymphatic: No ascites or pneumoperitoneum. No abdominal or pelvic lymphadenopathy, by size criteria. No aneurysmal dilation of the major abdominal arteries. There are mild peripheral atherosclerotic vascular calcifications of the aorta and its major branches. Reproductive: The uterus is unremarkable. There is a 3.5 x 4.3 cm hypoattenuating structure in the left adnexa, most likely ovarian in etiology. The structure exhibits fluid attenuation and has no discrete mural nodule. Even though this is incompletely characterized on the current exam, favored to represent a senescent ovarian cyst. Annual follow-up  ultrasound evaluation is recommended to documenting stability. Other: There is a tiny fat containing umbilical hernia. The soft tissues and abdominal wall are otherwise unremarkable. Musculoskeletal: No suspicious osseous lesions. There are moderate multilevel degenerative changes in the visualized spine. Review of the MIP images confirms the above findings. IMPRESSION: 1. No embolism to the proximal subsegmental pulmonary artery level. 2. There are multiple solid noncalcified lung nodules, compatible with metastases. 3. No acute inflammatory process identified within the abdomen or pelvis. 4. Multiple other nonacute observations, as described above. Electronically Signed   By: Jules Schick M.D.   On: 12/21/2023 11:18   DG Chest Port 1 View Result Date: 12/21/2023 CLINICAL DATA:  89 year old female with possible sepsis. EXAM: PORTABLE CHEST 1 VIEW COMPARISON:  Portable chest 02/12/2023 and earlier. FINDINGS: Portable AP semi upright view at 0844 hours. Low lung volumes not significantly changed from last year. Stable cardiac size and mediastinal contours. Visualized tracheal air column is within normal limits. Allowing for portable technique the lungs are clear. Chronic cervical ACDF. Stable visualized osseous structures. Negative visible bowel gas. IMPRESSION: Stable with chronically low lung volumes. No acute cardiopulmonary abnormality. Electronically Signed   By: Odessa Fleming M.D.   On: 12/21/2023 09:02    Assessment/Plan:    H/O Adrenaline insufficiency  Follow up with endo Cont with prednisone  H/O Lung nodules Follow up with pulmonology   Left ovarian cyst   Family declined for follow up   CKD  Cr improved   Gout  Uric acid 9.2  Will check uric acid levels in 4 weeks  Will change colchicine to allopurinol  COMPREHENSIVE METABOLIC PANEL GLUCOSE 79 mg/dL 64-40 Final  Fasting reference interval UREA NITROGEN (BUN) 10 mg/dL 3-47 Final CREATININE 4.25 mg/dL 9.56-3.87 Final EGFR 65  mL/min/1.73 m2 > OR = 60 Final BUN/CREATININE RATIO SEE NOTE: (calc) 6-22 Final  Not Reported: BUN and Creatinine are within  reference range. SODIUM 138 mmol/L 135-146 Final POTASSIUM 4.6 mmol/L 3.5-5.3 Final CHLORIDE 102 mmol/L 98-110 Final CARBON DIOXIDE 24 mmol/L 20-32 Final CALCIUM 9.1 mg/dL 5.6-43.3 Final PROTEIN, TOTAL 6.3 g/dL 2.9-5.1 Final ALBUMIN 3.3 g/dL 8.8-4.1 L Final GLOBULIN 3.0 g/dL (calc) 6.6-0.6 Final ALBUMIN/GLOBULIN RATIO 1.1 (calc) 1.0-2.5 Final BILIRUBIN, TOTAL 0.7 mg/dL 3.0-1.6 Final ALKALINE PHOSPHATASE 77 U/L 37-153 Final AST 23 U/L 10-35 Final ALT 30 U/L 6-29 H Final  HEMOGLOBIN 11.9 g/dL 01.0-93.2 Final HEMATOCRIT 36.8 % 35.0-45.0 Final MCV 90.4 fL 80.0-100.0 Final MCH 29.2 pg 27.0-33.0 Final MCHC 32.3 g/dL 35.5-73.2 Final For adults, a slight decrease in the calculated MCHC value (in the range of 30 to 32 g/dL) is most likely not clinically significant; however, it should be interpreted with caution in correlation with other red cell parameters and the patient's clinical condition. RDW 14.7 % 11.0-15.0 Final PLATELET COUNT 266 Thousand/u L 140-400 Final MPV 11.6 fL 7.5-12.5 Final ABSOLUTE NEUTROPHILS 9070 cells/uL 1500-7800 H Final ABSOLUTE LYMPHOCYTES 3954 cells/uL [phone] H Final ABSOLUTE MONOCYTES 1441 cells/uL  200-950 H Final ABSOLUTE EOSINOPHILS 191 cells/uL 15-500 Final ABSOLUTE BASOPHILS 44 cells/uL 0-200 Final NEUTROPHILS 61.7 % Final LYMPHOCYTES 26.9 % Final MONOCYTES 9.8 % Final EOSINOPHILS 1.3 % Final BASOPHILS 0.3 % Fina  Patient is being discharged  to assisted living   35 minTotal time spent for obtaining history,  performing a medically appropriate examination and evaluation, reviewing the tests,ordering  tests,  documenting clinical information in the electronic or other health record,  discharge planning,care coordination (not separately reported)

## 2024-01-09 DIAGNOSIS — M62521 Muscle wasting and atrophy, not elsewhere classified, right upper arm: Secondary | ICD-10-CM | POA: Diagnosis not present

## 2024-01-09 DIAGNOSIS — Z515 Encounter for palliative care: Secondary | ICD-10-CM | POA: Diagnosis not present

## 2024-01-09 DIAGNOSIS — R2681 Unsteadiness on feet: Secondary | ICD-10-CM | POA: Diagnosis not present

## 2024-01-09 DIAGNOSIS — I5032 Chronic diastolic (congestive) heart failure: Secondary | ICD-10-CM | POA: Diagnosis not present

## 2024-01-09 DIAGNOSIS — G9341 Metabolic encephalopathy: Secondary | ICD-10-CM | POA: Diagnosis not present

## 2024-01-09 DIAGNOSIS — M62522 Muscle wasting and atrophy, not elsewhere classified, left upper arm: Secondary | ICD-10-CM | POA: Diagnosis not present

## 2024-01-09 DIAGNOSIS — R2689 Other abnormalities of gait and mobility: Secondary | ICD-10-CM | POA: Diagnosis not present

## 2024-01-09 DIAGNOSIS — R918 Other nonspecific abnormal finding of lung field: Secondary | ICD-10-CM | POA: Diagnosis not present

## 2024-01-10 DIAGNOSIS — M62522 Muscle wasting and atrophy, not elsewhere classified, left upper arm: Secondary | ICD-10-CM | POA: Diagnosis not present

## 2024-01-10 DIAGNOSIS — R2689 Other abnormalities of gait and mobility: Secondary | ICD-10-CM | POA: Diagnosis not present

## 2024-01-10 DIAGNOSIS — R2681 Unsteadiness on feet: Secondary | ICD-10-CM | POA: Diagnosis not present

## 2024-01-10 DIAGNOSIS — M62521 Muscle wasting and atrophy, not elsewhere classified, right upper arm: Secondary | ICD-10-CM | POA: Diagnosis not present

## 2024-01-10 DIAGNOSIS — G9341 Metabolic encephalopathy: Secondary | ICD-10-CM | POA: Diagnosis not present

## 2024-01-15 DIAGNOSIS — R29898 Other symptoms and signs involving the musculoskeletal system: Secondary | ICD-10-CM | POA: Diagnosis not present

## 2024-01-15 DIAGNOSIS — R2681 Unsteadiness on feet: Secondary | ICD-10-CM | POA: Diagnosis not present

## 2024-01-15 DIAGNOSIS — R2689 Other abnormalities of gait and mobility: Secondary | ICD-10-CM | POA: Diagnosis not present

## 2024-01-15 DIAGNOSIS — M6281 Muscle weakness (generalized): Secondary | ICD-10-CM | POA: Diagnosis not present

## 2024-01-17 DIAGNOSIS — R2681 Unsteadiness on feet: Secondary | ICD-10-CM | POA: Diagnosis not present

## 2024-01-17 DIAGNOSIS — M6281 Muscle weakness (generalized): Secondary | ICD-10-CM | POA: Diagnosis not present

## 2024-01-17 DIAGNOSIS — R29898 Other symptoms and signs involving the musculoskeletal system: Secondary | ICD-10-CM | POA: Diagnosis not present

## 2024-01-17 DIAGNOSIS — R2689 Other abnormalities of gait and mobility: Secondary | ICD-10-CM | POA: Diagnosis not present

## 2024-01-21 ENCOUNTER — Non-Acute Institutional Stay: Payer: Self-pay | Admitting: Sports Medicine

## 2024-01-21 ENCOUNTER — Encounter: Payer: Self-pay | Admitting: Sports Medicine

## 2024-01-21 DIAGNOSIS — R29898 Other symptoms and signs involving the musculoskeletal system: Secondary | ICD-10-CM | POA: Diagnosis not present

## 2024-01-21 DIAGNOSIS — J9809 Other diseases of bronchus, not elsewhere classified: Secondary | ICD-10-CM | POA: Diagnosis not present

## 2024-01-21 DIAGNOSIS — R634 Abnormal weight loss: Secondary | ICD-10-CM | POA: Diagnosis not present

## 2024-01-21 DIAGNOSIS — R6889 Other general symptoms and signs: Secondary | ICD-10-CM | POA: Diagnosis not present

## 2024-01-21 DIAGNOSIS — R2689 Other abnormalities of gait and mobility: Secondary | ICD-10-CM | POA: Diagnosis not present

## 2024-01-21 DIAGNOSIS — M6281 Muscle weakness (generalized): Secondary | ICD-10-CM | POA: Diagnosis not present

## 2024-01-21 DIAGNOSIS — R2681 Unsteadiness on feet: Secondary | ICD-10-CM | POA: Diagnosis not present

## 2024-01-21 DIAGNOSIS — I10 Essential (primary) hypertension: Secondary | ICD-10-CM | POA: Diagnosis not present

## 2024-01-21 NOTE — Progress Notes (Signed)
 Provider:  Dr. Tye Gall Location:  [facility] Place of Service:   Assisted living facility   PCP: Mast, [name], NP [name], [name], [name]  Extended Emergency Contact Information Primary Emergency Contact: Debrew,Jacqueline  [place] Home Phone: [phone] Mobile Phone: [phone] Relation: Daughter  Goals of Care: Advanced Directive information    12/21/2023    8:00 PM  Advanced Directives  Does Patient Have a Medical Advance Directive? Yes  Type of Estate agent of [place];Living will  Does patient want to make changes to medical advance directive? No - Patient declined  Copy of Healthcare Power of Attorney in Chart? Yes - validated most recent copy scanned in chart (See row information)         History of Present Illness          89 yr old F with h/o adrenal insufficiency on prednisone , gout was evaluated for acute visit for fever and flu like symptoms Pt seen and examined in her room She is laying on her bed As per staff pt did not eat her lunch  She knows her name , oriented to time and place C/o productive cough with yellowish phlegm Denies SOB, runny nose, sore throat, abdominal pain, nausea, vomiting C/o body aches  Denies diarrhea, dysuria She is febrile with temp of 100   Past Medical History:  Diagnosis Date   Adrenal failure (HCC)    Arthritis    Asthma    Cancer (HCC)    Cataract    Closed nondisplaced fracture of fifth right metatarsal bone 09/18/2017   Diverticulitis    Per patient   E coli bacteremia    Fibromyalgia 2008   GERD (gastroesophageal reflux disease) 12/18/2016   HCAP (healthcare-associated pneumonia) 02/03/2018   Hyperlipidemia 10/13/2021   Osteoporosis    RA (rheumatoid arthritis) (HCC)    Recurrent upper respiratory infection (URI)    Sepsis due to urinary tract infection (HCC) 01/18/2018   Urticaria    Past  Surgical History:  Procedure Laterality Date   BACK SURGERY     BILATERAL CARPAL TUNNEL RELEASE  2005   right and left   CATARACT EXTRACTION, BILATERAL  2004   right and left   CERVICAL FUSION  2011,2010,2008   2 disks   HEEL SPUR SURGERY  2004   lower back fusion  2011   Fusion of 3-4 and 4-5 lower back   RADIOFREQUENCY ABLATION  2020   ROTATOR CUFF REPAIR  [id number]   SQUAMOUS CELL CARCINOMA EXCISION     TONSILLECTOMY AND ADENOIDECTOMY  1947   TOTAL SHOULDER ARTHROPLASTY      reports that she quit smoking about 57 years ago. Her smoking use included cigarettes. She started smoking about 67 years ago. She has a 7.5 pack-year smoking history. She has never been exposed to tobacco smoke. She has never used smokeless tobacco. She reports that she does not drink alcohol and does not use drugs. Social History   Socioeconomic History   Marital status: Widowed    Spouse name: Not on file   Number of children: 2   Years of education: Not on file   Highest education level: Not on file  Occupational History   Occupation: Retired Child psychotherapist  Tobacco Use   Smoking status: Former    Current packs/day: 0.00    Average packs/day: 0.8 packs/day for 10.0 years (7.5 ttl pk-yrs)    Types: Cigarettes  Start date: 48    Quit date: 1968    Years since quitting: 50.1    Passive exposure: Never   Smokeless tobacco: Never  Vaping Use   Vaping status: Never Used  Substance and Sexual Activity   Alcohol use: No   Drug use: No   Sexual activity: Not Currently  Other Topics Concern   Not on file  Social History Narrative      Diet:        Do you drink/ eat things with caffeine? Dr. Kathlene Paradise 2/ day      Marital status: Widowed                              What year were you married ? 1953      Do you live in a house, apartment,assistred living, condo, trailer, etc.)? Apartment      Is it one or more stories?       How many persons live in your home ? 1      Do you have any pets  in your home ?(please list) No      Highest Level of education completed: PhD       Current or past profession: Paramedic, Child psychotherapist, Special Educator       Do you exercise?   No                           Type & how often       ADVANCED DIRECTIVES (Please bring copies)      Do you have a living will? Yes      Do you have a DNR form?                       If not, do you want to discuss one? Yes      Do you have signed POA?HPOA forms?                 If so, please bring to your appointment Tes      FUNCTIONAL STATUS- To be completed by Spouse / child / Staff       Do you have difficulty bathing or dressing yourself ? No      Do you have difficulty preparing food or eating ? No      Do you have difficulty managing your mediation ? No      Do you have difficulty managing your finances ? No      Do you have difficulty affording your medication ? No      Social Drivers of Corporate investment banker Strain: Not on file  Food Insecurity: No Food Insecurity (12/21/2023)   Hunger Vital Sign    Worried About Running Out of Food in the Last Year: Never true    Ran Out of Food in the Last Year: Never true  Transportation Needs: No Transportation Needs (12/21/2023)   PRAPARE - Administrator, Civil Service (Medical): No    Lack of Transportation (Non-Medical): No  Physical Activity: Not on file  Stress: Not on file  Social Connections: Not on file  Intimate Partner Violence: Not At Risk (12/21/2023)   Humiliation, Afraid, Rape, and Kick questionnaire    Fear of Current or Ex-Partner: No    Emotionally Abused: No    Physically Abused: No    Sexually Abused: No  Functional Status Survey:    Family History  Problem Relation Age of Onset   Heart attack Maternal Grandmother    Heart attack Paternal Grandfather    Breast cancer Mother 66   Diabetes Father    Heart disease Father    Congestive Heart Failure Father 20       Died from   Allergic rhinitis Neg Hx     Asthma Neg Hx    Eczema Neg Hx    Urticaria Neg Hx     Health Maintenance  Topic Date Due   COVID-19 Vaccine (6 - 2024-25 season) 11/21/2023   DTaP/Tdap/Td (2 - Td or Tdap) 04/03/2033   Pneumonia Vaccine 62+ Years old  Completed   INFLUENZA VACCINE  Completed   DEXA SCAN  Completed   Zoster Vaccines- Shingrix  Completed   HPV VACCINES  Aged Out    Allergies  Allergen Reactions   Adhesive [Tape] Rash   Celebrex [Celecoxib] Hives   Ciprofibrate Nausea Only   Cymbalta [Duloxetine Hcl] Swelling   Gabitril [Tiagabine] Swelling   Lyrica [Pregabalin] Swelling   Neurontin [Gabapentin] Swelling   Nexium  [Esomeprazole ] Rash   Nsaids Rash   Shrimp [Shellfish Allergy] Anaphylaxis    Per patient "shrimp only"   Azactam  [Aztreonam ]     Hand swelling    Azelastine  Hcl     Rash    Ciprofloxacin  Other (See Comments)    dizziness   Claritin  [Loratadine ]     Irritability Nervousness    Methotrexate  Derivatives    Nasacort  [Triamcinolone ]     Dizzy    Olopatadine  Other (See Comments)    Pain and lethargy    Other    Sulfamethizole Other (See Comments)    unknown   Zantac [Ranitidine Hcl] Other (See Comments)    unknown   Claritin -D 12 Hour [Loratadine -Pseudoephedrine Er] Anxiety   Keflex  [Cephalexin ] Nausea And Vomiting    Tolerated Ancef    Penicillins Rash    Injection site reaction. Tolerated cefepime  in past. Also reports tolerating a penicillin infusion after this initial rxn ~20 yrs ago.  Has patient had a PCN reaction causing immediate rash, facial/tongue/throat swelling, SOB or lightheadedness with hypotension: No Has patient had a PCN reaction causing severe rash involving mucus membranes or skin necrosis: No Has patient had a PCN reaction that required hospitalization No Has patient had a PCN reaction occurring within the last 10 years: No     Sulfa Antibiotics Nausea And Vomiting    Outpatient Encounter Medications as of 01/21/2024  Medication Sig    acetaminophen  (TYLENOL ) 500 MG tablet Take 650 mg by mouth in the morning and at bedtime.   amLODipine  (NORVASC ) 5 MG tablet Take 5 mg by mouth daily.   atorvastatin  (LIPITOR) 20 MG tablet TAKE 1 TABLET BY MOUTH EVERY DAY   Biotin  5 MG TBDP Take 1 tablet (5 mg total) by mouth daily.   Cholecalciferol  (VITAMIN D3) 125 MCG (5000 UT) CAPS TAKE 1 CAPSULE BY MOUTH EVERY DAY   colchicine  0.6 MG tablet TAKE 1 TABLET BY MOUTH EVERY DAY   CRANBERRY PO Take 1 capsule by mouth 2 (two) times daily.   cycloSPORINE  (RESTASIS ) 0.05 % ophthalmic emulsion Place 1 drop into both eyes 2 (two) times daily.   D-Mannose 500 MG CAPS Take 500 mg by mouth daily.   diclofenac  Sodium (VOLTAREN ) 1 % GEL APPLY 2 TO 4 GRAMS TOPICALLY TO AFFECTED JOINTS UP TO 4 TIMES DAILY   fexofenadine  (ALLEGRA ) 180 MG tablet Take 1 tablet (  180 mg total) by mouth daily.   ipratropium-albuterol  (DUONEB) 0.5-2.5 (3) MG/3ML SOLN Take 3 mLs by nebulization as needed.   lansoprazole  (PREVACID ) 30 MG capsule TAKE 1 CAPSULE BY MOUTH ONCE DAILY AT NOON (Patient taking differently: Take 30 mg by mouth daily in the afternoon.)   montelukast  (SINGULAIR ) 10 MG tablet TAKE 1 TABLET BY MOUTH EVERYDAY AT BEDTIME (Patient taking differently: Take 10 mg by mouth at bedtime.)   ondansetron  (ZOFRAN ) 4 MG tablet Take 4 mg by mouth as needed for nausea or vomiting.   potassium chloride  SA (KLOR-CON  M) 20 MEQ tablet Take 40 mEq by mouth 2 (two) times daily.   predniSONE  (DELTASONE ) 5 MG tablet Take 5 mg by mouth daily with breakfast.   saccharomyces boulardii (FLORASTOR) 250 MG capsule Take 250 mg by mouth 2 (two) times daily.   Torsemide 40 MG TABS Take 40 mg by mouth daily.   traMADol  (ULTRAM ) 50 MG tablet Take 1 tablet (50 mg total) by mouth every 6 (six) hours as needed (pain).   No facility-administered encounter medications on file as of 01/21/2024.    Review of Systems  Constitutional:  Positive for chills, fatigue and fever.  HENT:  Negative for  sinus pressure and sore throat.   Respiratory:  Positive for cough. Negative for shortness of breath and wheezing.   Cardiovascular:  Negative for chest pain, palpitations and leg swelling.  Gastrointestinal:  Negative for abdominal distention, abdominal pain, blood in stool, constipation, diarrhea, nausea and vomiting.  Genitourinary:  Negative for dysuria, frequency and urgency.  Neurological:  Negative for dizziness, weakness and numbness.   Negative unless indicated in HPI.  There were no vitals filed for this visit. There is no height or weight on file to calculate BMI. BP Readings from Last 3 Encounters:  01/07/24 132/70  12/31/23 126/70  12/26/23 (!) 146/78   Wt Readings from Last 3 Encounters:  01/07/24 162 lb (73.5 kg)  12/31/23 162 lb (73.5 kg)  12/26/23 171 lb 15.3 oz (78 kg)   Physical Exam Constitutional:      Appearance: Normal appearance.  HENT:     Head: Normocephalic and atraumatic.  Cardiovascular:     Rate and Rhythm: Normal rate and regular rhythm.  Pulmonary:     Effort: Pulmonary effort is normal. No respiratory distress.     Breath sounds: Normal breath sounds. No wheezing.  Abdominal:     General: Bowel sounds are normal. There is no distension.     Tenderness: There is no abdominal tenderness. There is no guarding or rebound.     Comments:    Musculoskeletal:        General: No swelling or tenderness.  Neurological:     Mental Status: She is alert. Mental status is at baseline.     Sensory: No sensory deficit.     Motor: No weakness.     Labs reviewed: Basic Metabolic Panel: Recent Labs    12/21/23 1639 12/22/23 0338 12/24/23 0346 12/25/23 0356 12/26/23 0418  NA  --    < > 140 139 140  K  --    < > 3.2* 3.3* 3.2*  CL  --    < > 113* 110 107  CO2  --    < > 23 23 25   GLUCOSE  --    < > 85 83 87  BUN  --    < > 15 11 7*  CREATININE 1.07*   < > 0.84 0.78 0.77  CALCIUM   --    < >  8.8* 8.6* 8.9  MG 1.6*  --  2.3 2.2 2.1  PHOS 3.6  --    --   --   --    < > = values in this interval not displayed.   Liver Function Tests: Recent Labs    12/21/23 0900 12/22/23 0338 12/26/23 0418  AST 50* 45* 52*  ALT 28 26 57*  ALKPHOS 75 57 61  BILITOT 0.8 0.6 0.8  PROT 7.0 5.2* 6.1*  ALBUMIN  3.3* 2.5* 2.8*   Recent Labs    12/21/23 1639  LIPASE 20   Recent Labs    12/21/23 1130  AMMONIA 23   CBC: Recent Labs    12/21/23 0900 12/21/23 1639 12/22/23 0338 12/24/23 0346 12/26/23 0418  WBC 9.9   < > 7.5 8.1 10.0  NEUTROABS 6.8  --   --  3.5 4.7  HGB 13.7   < > 10.6* 10.3* 11.6*  HCT 44.7   < > 34.1* 32.8* 37.1  MCV 94.1   < > 93.9 92.4 92.8  PLT 226   < > 169 156 188   < > = values in this interval not displayed.   Cardiac Enzymes: No results for input(s): "CKTOTAL", "CKMB", "CKMBINDEX", "TROPONINI" in the last 8760 hours. BNP: Invalid input(s): "POCBNP" Lab Results  Component Value Date   HGBA1C 6.5 11/28/2023   Lab Results  Component Value Date   TSH 0.865 02/27/2022   Lab Results  Component Value Date   VITAMINB12 1,377 (H) 05/26/2022   Lab Results  Component Value Date   FOLATE 26.8 02/06/2018   Lab Results  Component Value Date   IRON 89 02/06/2018   TIBC 281 02/06/2018   FERRITIN 74 02/06/2018    Imaging and Procedures obtained prior to SNF admission: ECHOCARDIOGRAM COMPLETE Result Date: 12/21/2023    ECHOCARDIOGRAM REPORT   Patient Name:   KATHARYN QUINCEY Date of Exam: 12/21/2023 Medical Rec #:  [id number]          Height:       64.0 in Accession #:    [id number]         Weight:       162.0 lb Date of Birth:  1932-10-08          BSA:          1.789 m Patient Age:    89 years           BP:           97/58 mmHg Patient Gender: F                  HR:           76 bpm. Exam Location:  Inpatient Procedure: 2D Echo, Cardiac Doppler and Color Doppler STAT ECHO Indications:    Shock  History:        Patient has prior history of Echocardiogram examinations, most                 recent 05/22/2022. CHF,  Arrythmias:Tachycardia,                 Signs/Symptoms:Shortness of Breath; Risk Factors:Dyslipidemia                 and Hypertension.  Sonographer:    Juanita Shaw Referring Phys: [id number] JONATHAN B DEWALD IMPRESSIONS  1. Left ventricular ejection fraction, by estimation, is 55 to 60%. The left ventricle has normal function. The left ventricle has no regional wall motion abnormalities.  Left ventricular diastolic parameters are consistent with Grade I diastolic dysfunction (impaired relaxation).  2. Right ventricular systolic function is normal. The right ventricular size is normal. Tricuspid regurgitation signal is inadequate for assessing PA pressure.  3. The mitral valve is normal in structure. No evidence of mitral valve regurgitation. No evidence of mitral stenosis.  4. The aortic valve is tricuspid. There is mild calcification of the aortic valve. Aortic valve regurgitation is not visualized. No aortic stenosis is present.  5. The inferior vena cava is normal in size with greater than 50% respiratory variability, suggesting right atrial pressure of 3 mmHg. FINDINGS  Left Ventricle: Left ventricular ejection fraction, by estimation, is 55 to 60%. The left ventricle has normal function. The left ventricle has no regional wall motion abnormalities. The left ventricular internal cavity size was normal in size. There is  no left ventricular hypertrophy. Left ventricular diastolic parameters are consistent with Grade I diastolic dysfunction (impaired relaxation). Right Ventricle: The right ventricular size is normal. No increase in right ventricular wall thickness. Right ventricular systolic function is normal. Tricuspid regurgitation signal is inadequate for assessing PA pressure. Left Atrium: Left atrial size was normal in size. Right Atrium: Right atrial size was normal in size. Pericardium: There is no evidence of pericardial effusion. Mitral Valve: The mitral valve is normal in structure. No evidence of mitral  valve regurgitation. No evidence of mitral valve stenosis. MV peak gradient, 2.5 mmHg. The mean mitral valve gradient is 1.0 mmHg. Tricuspid Valve: The tricuspid valve is normal in structure. Tricuspid valve regurgitation is not demonstrated. Aortic Valve: The aortic valve is tricuspid. There is mild calcification of the aortic valve. Aortic valve regurgitation is not visualized. No aortic stenosis is present. Aortic valve mean gradient measures 6.0 mmHg. Aortic valve peak gradient measures 11.4 mmHg. Aortic valve area, by VTI measures 2.06 cm. Pulmonic Valve: The pulmonic valve was normal in structure. Pulmonic valve regurgitation is not visualized. Aorta: The aortic root is normal in size and structure. Venous: The inferior vena cava is normal in size with greater than 50% respiratory variability, suggesting right atrial pressure of 3 mmHg. IAS/Shunts: No atrial level shunt detected by color flow Doppler.  LEFT VENTRICLE PLAX 2D LVIDd:         3.90 cm      Diastology LVIDs:         3.00 cm      LV e' medial:    6.85 cm/s LV PW:         0.70 cm      LV E/e' medial:  9.8 LV IVS:        0.80 cm      LV e' lateral:   8.05 cm/s LVOT diam:     1.80 cm      LV E/e' lateral: 8.3 LV SV:         64 LV SV Index:   36 LVOT Area:     2.54 cm  LV Volumes (MOD) LV vol d, MOD A2C: 104.0 ml LV vol d, MOD A4C: 112.0 ml LV vol s, MOD A2C: 34.3 ml LV vol s, MOD A4C: 50.3 ml LV SV MOD A2C:     69.7 ml LV SV MOD A4C:     112.0 ml LV SV MOD BP:      67.4 ml RIGHT VENTRICLE             IVC RV Basal diam:  3.80 cm     IVC diam: 1.70 cm  RV Mid diam:    3.70 cm RV S prime:     13.50 cm/s TAPSE (M-mode): 2.6 cm LEFT ATRIUM             Index        RIGHT ATRIUM          Index LA diam:        3.10 cm 1.73 cm/m   RA Area:     9.11 cm LA Vol (A2C):   22.4 ml 12.52 ml/m  RA Volume:   17.40 ml 9.73 ml/m LA Vol (A4C):   25.1 ml 14.03 ml/m LA Biplane Vol: 25.9 ml 14.48 ml/m  AORTIC VALVE                     PULMONIC VALVE AV Area (Vmax):     2.02 cm      PV Vmax:       0.78 m/s AV Area (Vmean):   1.51 cm      PV Peak grad:  2.4 mmHg AV Area (VTI):     2.06 cm AV Vmax:           169.00 cm/s AV Vmean:          119.000 cm/s AV VTI:            0.309 m AV Peak Grad:      11.4 mmHg AV Mean Grad:      6.0 mmHg LVOT Vmax:         134.00 cm/s LVOT Vmean:        70.800 cm/s LVOT VTI:          0.250 m LVOT/AV VTI ratio: 0.81  AORTA Ao Root diam: 2.80 cm Ao Asc diam:  2.90 cm MITRAL VALVE MV Area (PHT): 3.12 cm    SHUNTS MV Area VTI:   2.37 cm    Systemic VTI:  0.25 m MV Peak grad:  2.5 mmHg    Systemic Diam: 1.80 cm MV Mean grad:  1.0 mmHg MV Vmax:       0.79 m/s MV Vmean:      51.8 cm/s MV Decel Time: 243 msec MV E velocity: 67.00 cm/s MV A velocity: 62.90 cm/s MV E/A ratio:  1.07 Dalton McleanMD Electronically signed by Archer Bear Signature Date/Time: 12/21/2023/3:10:49 PM    Final    DG Hip Unilat W or Wo Pelvis 2-3 Views Left Result Date: 12/21/2023 CLINICAL DATA:  Left hip pain. EXAM: DG HIP (WITH OR WITHOUT PELVIS) 2-3V LEFT COMPARISON:  None Available. FINDINGS: There is no acute fracture or dislocation.Evaluation for fracture is limited due to osteopenia and body habitus. There is moderate left and severe right hip osteoarthritic changes. There is near complete loss of right hip joint space with near bone on bone contact. Excreted contrast noted in the urinary bladder. The soft tissues are unremarkable. IMPRESSION: 1. No acute fracture or dislocation. 2. Moderate left and severe right hip osteoarthritis. Electronically Signed   By: Angus Bark M.D.   On: 12/21/2023 12:38   CT Angio Chest PE W and/or Wo Contrast Result Date: 12/21/2023 CLINICAL DATA:  Pulmonary embolism (PE) suspected, high prob; Epigastric pain sepsis. Nausea and lethargy. EXAM: CT ANGIOGRAPHY CHEST CT ABDOMEN AND PELVIS WITH CONTRAST TECHNIQUE: Multidetector CT imaging of the chest was performed using the standard protocol during bolus administration of intravenous  contrast. Multiplanar CT image reconstructions and MIPs were obtained to evaluate the vascular anatomy. Multidetector CT imaging of the abdomen and  pelvis was performed using the standard protocol during bolus administration of intravenous contrast. RADIATION DOSE REDUCTION: This exam was performed according to the departmental dose-optimization program which includes automated exposure control, adjustment of the mA and/or kV according to patient size and/or use of iterative reconstruction technique. CONTRAST:  75mL OMNIPAQUE  IOHEXOL  350 MG/ML SOLN COMPARISON:  CT scan abdomen and pelvis from 02/12/2023. FINDINGS: CTA CHEST FINDINGS Cardiovascular: No evidence of embolism to the proximal subsegmental pulmonary artery level. Normal cardiac size. No pericardial effusion. No aortic aneurysm. There are coronary artery calcifications, in keeping with coronary artery disease. Mediastinum/Nodes: Visualized thyroid  gland appears grossly unremarkable. No solid / cystic mediastinal masses. The esophagus is nondistended precluding optimal assessment. There are few mildly prominent mediastinal and hilar lymph nodes, which do not meet the size criteria for lymphadenopathy. No axillary lymphadenopathy by size criteria. Lungs/Pleura: The central tracheo-bronchial tree is patent. There is mild, smooth, circumferential thickening of the segmental and subsegmental bronchial walls, throughout bilateral lungs, which is nonspecific. Findings are most commonly seen with bronchitis or reactive airway disease, such as asthma. There are occlusive and nonocclusive filling defects in the distal segmental and subsegmental bronchi, throughout bilateral lungs, likely due to mucus/secretions or aspiration. There are multiple solid noncalcified nodules throughout bilateral lungs with largest in the anterior left upper lobe measuring up to 1.8 x 1.8 cm. This nodule exhibits small central cavitation. Rest of the nodules are smaller in size and do  not exhibit cavitations. These are compatible with metastases. No consolidation, pleural effusion or pneumothorax. There are dependent changes in bilateral lungs. Musculoskeletal: The visualized soft tissues of the chest wall are grossly unremarkable. No suspicious osseous lesions. There are mild multilevel degenerative changes in the visualized spine. Review of the MIP images confirms the above findings. CT ABDOMEN and PELVIS FINDINGS Hepatobiliary: The liver is normal in size. Non-cirrhotic configuration. No suspicious mass. No intrahepatic or extrahepatic bile duct dilation. No calcified gallstones. Normal gallbladder wall thickness. No pericholecystic inflammatory changes. Pancreas: Unremarkable. No pancreatic ductal dilatation or surrounding inflammatory changes. Spleen: Within normal limits. No focal lesion. Adrenals/Urinary Tract: Adrenal glands are unremarkable. No suspicious renal mass. There is a 2.3 x 2.4 cm simple cyst in the left kidney interpolar region, anteriorly. There are additional several smaller cysts throughout bilateral kidneys. No hydronephrosis. No renal or ureteric calculi. Unremarkable urinary bladder. Stomach/Bowel: There is a small sliding hiatal hernia. No disproportionate dilation of the small or large bowel loops. No evidence of abnormal bowel wall thickening or inflammatory changes. The appendix is unremarkable. There are scattered diverticula mainly in the sigmoid colon, without imaging signs of diverticulitis. Vascular/Lymphatic: No ascites or pneumoperitoneum. No abdominal or pelvic lymphadenopathy, by size criteria. No aneurysmal dilation of the major abdominal arteries. There are mild peripheral atherosclerotic vascular calcifications of the aorta and its major branches. Reproductive: The uterus is unremarkable. There is a 3.5 x 4.3 cm hypoattenuating structure in the left adnexa, most likely ovarian in etiology. The structure exhibits fluid attenuation and has no discrete mural  nodule. Even though this is incompletely characterized on the current exam, favored to represent a senescent ovarian cyst. Annual follow-up ultrasound evaluation is recommended to documenting stability. Other: There is a tiny fat containing umbilical hernia. The soft tissues and abdominal wall are otherwise unremarkable. Musculoskeletal: No suspicious osseous lesions. There are moderate multilevel degenerative changes in the visualized spine. Review of the MIP images confirms the above findings. IMPRESSION: 1. No embolism to the proximal subsegmental pulmonary artery level. 2. There are  multiple solid noncalcified lung nodules, compatible with metastases. 3. No acute inflammatory process identified within the abdomen or pelvis. 4. Multiple other nonacute observations, as described above. Electronically Signed   By: Beula Brunswick M.D.   On: 12/21/2023 11:18   CT ABDOMEN PELVIS W CONTRAST Result Date: 12/21/2023 CLINICAL DATA:  Pulmonary embolism (PE) suspected, high prob; Epigastric pain sepsis. Nausea and lethargy. EXAM: CT ANGIOGRAPHY CHEST CT ABDOMEN AND PELVIS WITH CONTRAST TECHNIQUE: Multidetector CT imaging of the chest was performed using the standard protocol during bolus administration of intravenous contrast. Multiplanar CT image reconstructions and MIPs were obtained to evaluate the vascular anatomy. Multidetector CT imaging of the abdomen and pelvis was performed using the standard protocol during bolus administration of intravenous contrast. RADIATION DOSE REDUCTION: This exam was performed according to the departmental dose-optimization program which includes automated exposure control, adjustment of the mA and/or kV according to patient size and/or use of iterative reconstruction technique. CONTRAST:  75mL OMNIPAQUE  IOHEXOL  350 MG/ML SOLN COMPARISON:  CT scan abdomen and pelvis from 02/12/2023. FINDINGS: CTA CHEST FINDINGS Cardiovascular: No evidence of embolism to the proximal subsegmental  pulmonary artery level. Normal cardiac size. No pericardial effusion. No aortic aneurysm. There are coronary artery calcifications, in keeping with coronary artery disease. Mediastinum/Nodes: Visualized thyroid  gland appears grossly unremarkable. No solid / cystic mediastinal masses. The esophagus is nondistended precluding optimal assessment. There are few mildly prominent mediastinal and hilar lymph nodes, which do not meet the size criteria for lymphadenopathy. No axillary lymphadenopathy by size criteria. Lungs/Pleura: The central tracheo-bronchial tree is patent. There is mild, smooth, circumferential thickening of the segmental and subsegmental bronchial walls, throughout bilateral lungs, which is nonspecific. Findings are most commonly seen with bronchitis or reactive airway disease, such as asthma. There are occlusive and nonocclusive filling defects in the distal segmental and subsegmental bronchi, throughout bilateral lungs, likely due to mucus/secretions or aspiration. There are multiple solid noncalcified nodules throughout bilateral lungs with largest in the anterior left upper lobe measuring up to 1.8 x 1.8 cm. This nodule exhibits small central cavitation. Rest of the nodules are smaller in size and do not exhibit cavitations. These are compatible with metastases. No consolidation, pleural effusion or pneumothorax. There are dependent changes in bilateral lungs. Musculoskeletal: The visualized soft tissues of the chest wall are grossly unremarkable. No suspicious osseous lesions. There are mild multilevel degenerative changes in the visualized spine. Review of the MIP images confirms the above findings. CT ABDOMEN and PELVIS FINDINGS Hepatobiliary: The liver is normal in size. Non-cirrhotic configuration. No suspicious mass. No intrahepatic or extrahepatic bile duct dilation. No calcified gallstones. Normal gallbladder wall thickness. No pericholecystic inflammatory changes. Pancreas: Unremarkable. No  pancreatic ductal dilatation or surrounding inflammatory changes. Spleen: Within normal limits. No focal lesion. Adrenals/Urinary Tract: Adrenal glands are unremarkable. No suspicious renal mass. There is a 2.3 x 2.4 cm simple cyst in the left kidney interpolar region, anteriorly. There are additional several smaller cysts throughout bilateral kidneys. No hydronephrosis. No renal or ureteric calculi. Unremarkable urinary bladder. Stomach/Bowel: There is a small sliding hiatal hernia. No disproportionate dilation of the small or large bowel loops. No evidence of abnormal bowel wall thickening or inflammatory changes. The appendix is unremarkable. There are scattered diverticula mainly in the sigmoid colon, without imaging signs of diverticulitis. Vascular/Lymphatic: No ascites or pneumoperitoneum. No abdominal or pelvic lymphadenopathy, by size criteria. No aneurysmal dilation of the major abdominal arteries. There are mild peripheral atherosclerotic vascular calcifications of the aorta and its major branches. Reproductive:  The uterus is unremarkable. There is a 3.5 x 4.3 cm hypoattenuating structure in the left adnexa, most likely ovarian in etiology. The structure exhibits fluid attenuation and has no discrete mural nodule. Even though this is incompletely characterized on the current exam, favored to represent a senescent ovarian cyst. Annual follow-up ultrasound evaluation is recommended to documenting stability. Other: There is a tiny fat containing umbilical hernia. The soft tissues and abdominal wall are otherwise unremarkable. Musculoskeletal: No suspicious osseous lesions. There are moderate multilevel degenerative changes in the visualized spine. Review of the MIP images confirms the above findings. IMPRESSION: 1. No embolism to the proximal subsegmental pulmonary artery level. 2. There are multiple solid noncalcified lung nodules, compatible with metastases. 3. No acute inflammatory process identified  within the abdomen or pelvis. 4. Multiple other nonacute observations, as described above. Electronically Signed   By: Beula Brunswick M.D.   On: 12/21/2023 11:18   DG Chest Port 1 View Result Date: 12/21/2023 CLINICAL DATA:  89 year old female with possible sepsis. EXAM: PORTABLE CHEST 1 VIEW COMPARISON:  Portable chest 02/12/2023 and earlier. FINDINGS: Portable AP semi upright view at 0844 hours. Low lung volumes not significantly changed from last year. Stable cardiac size and mediastinal contours. Visualized tracheal air column is within normal limits. Allowing for portable technique the lungs are clear. Chronic cervical ACDF. Stable visualized osseous structures. Negative visible bowel gas. IMPRESSION: Stable with chronically low lung volumes. No acute cardiopulmonary abnormality. Electronically Signed   By: Marlise Simpers M.D.   On: 12/21/2023 09:02    Assessment and Plan         1. Flu-like symptoms (Primary)  Pt with fevers, cough, myalgias Sat 92 % on RA  Some of the patients on the floor where patient is residing tested positive for flu Pt had flu test which came negative Will start tessalon  for cough  Monitor vitals every shift  Will get cbc, bmp,  If chest x ray is negative will start tamiflu  Cont with allegra    30 min Total time spent for obtaining history,  performing a medically appropriate examination and evaluation, reviewing the tests,ordering  tests,  documenting clinical information in the electronic or other health record,  ,care coordination (not separately reported)    Labs/tests ordered:  Alzada Brazee

## 2024-01-22 ENCOUNTER — Encounter: Payer: Self-pay | Admitting: Nurse Practitioner

## 2024-01-22 DIAGNOSIS — R2689 Other abnormalities of gait and mobility: Secondary | ICD-10-CM | POA: Diagnosis not present

## 2024-01-22 DIAGNOSIS — J069 Acute upper respiratory infection, unspecified: Secondary | ICD-10-CM | POA: Insufficient documentation

## 2024-01-22 DIAGNOSIS — R29898 Other symptoms and signs involving the musculoskeletal system: Secondary | ICD-10-CM | POA: Diagnosis not present

## 2024-01-22 DIAGNOSIS — M6281 Muscle weakness (generalized): Secondary | ICD-10-CM | POA: Diagnosis not present

## 2024-01-22 DIAGNOSIS — R2681 Unsteadiness on feet: Secondary | ICD-10-CM | POA: Diagnosis not present

## 2024-01-23 ENCOUNTER — Institutional Professional Consult (permissible substitution): Payer: Medicare PPO | Admitting: Emergency Medicine

## 2024-01-24 DIAGNOSIS — R29898 Other symptoms and signs involving the musculoskeletal system: Secondary | ICD-10-CM | POA: Diagnosis not present

## 2024-01-24 DIAGNOSIS — R2689 Other abnormalities of gait and mobility: Secondary | ICD-10-CM | POA: Diagnosis not present

## 2024-01-24 DIAGNOSIS — M6281 Muscle weakness (generalized): Secondary | ICD-10-CM | POA: Diagnosis not present

## 2024-01-24 DIAGNOSIS — R2681 Unsteadiness on feet: Secondary | ICD-10-CM | POA: Diagnosis not present

## 2024-01-28 DIAGNOSIS — M6281 Muscle weakness (generalized): Secondary | ICD-10-CM | POA: Diagnosis not present

## 2024-01-28 DIAGNOSIS — R2689 Other abnormalities of gait and mobility: Secondary | ICD-10-CM | POA: Diagnosis not present

## 2024-01-28 DIAGNOSIS — R2681 Unsteadiness on feet: Secondary | ICD-10-CM | POA: Diagnosis not present

## 2024-01-28 DIAGNOSIS — R29898 Other symptoms and signs involving the musculoskeletal system: Secondary | ICD-10-CM | POA: Diagnosis not present

## 2024-01-31 DIAGNOSIS — R29898 Other symptoms and signs involving the musculoskeletal system: Secondary | ICD-10-CM | POA: Diagnosis not present

## 2024-01-31 DIAGNOSIS — M6281 Muscle weakness (generalized): Secondary | ICD-10-CM | POA: Diagnosis not present

## 2024-01-31 DIAGNOSIS — R2681 Unsteadiness on feet: Secondary | ICD-10-CM | POA: Diagnosis not present

## 2024-01-31 DIAGNOSIS — R2689 Other abnormalities of gait and mobility: Secondary | ICD-10-CM | POA: Diagnosis not present

## 2024-02-01 DIAGNOSIS — M6281 Muscle weakness (generalized): Secondary | ICD-10-CM | POA: Diagnosis not present

## 2024-02-01 DIAGNOSIS — R2681 Unsteadiness on feet: Secondary | ICD-10-CM | POA: Diagnosis not present

## 2024-02-01 DIAGNOSIS — R29898 Other symptoms and signs involving the musculoskeletal system: Secondary | ICD-10-CM | POA: Diagnosis not present

## 2024-02-01 DIAGNOSIS — R2689 Other abnormalities of gait and mobility: Secondary | ICD-10-CM | POA: Diagnosis not present

## 2024-02-04 DIAGNOSIS — M6281 Muscle weakness (generalized): Secondary | ICD-10-CM | POA: Diagnosis not present

## 2024-02-04 DIAGNOSIS — R2689 Other abnormalities of gait and mobility: Secondary | ICD-10-CM | POA: Diagnosis not present

## 2024-02-04 DIAGNOSIS — R29898 Other symptoms and signs involving the musculoskeletal system: Secondary | ICD-10-CM | POA: Diagnosis not present

## 2024-02-04 DIAGNOSIS — R2681 Unsteadiness on feet: Secondary | ICD-10-CM | POA: Diagnosis not present

## 2024-02-05 DIAGNOSIS — M6281 Muscle weakness (generalized): Secondary | ICD-10-CM | POA: Diagnosis not present

## 2024-02-05 DIAGNOSIS — R2681 Unsteadiness on feet: Secondary | ICD-10-CM | POA: Diagnosis not present

## 2024-02-05 DIAGNOSIS — M109 Gout, unspecified: Secondary | ICD-10-CM | POA: Diagnosis not present

## 2024-02-05 DIAGNOSIS — R2689 Other abnormalities of gait and mobility: Secondary | ICD-10-CM | POA: Diagnosis not present

## 2024-02-05 DIAGNOSIS — R29898 Other symptoms and signs involving the musculoskeletal system: Secondary | ICD-10-CM | POA: Diagnosis not present

## 2024-02-06 ENCOUNTER — Non-Acute Institutional Stay: Payer: Self-pay | Admitting: Nurse Practitioner

## 2024-02-06 ENCOUNTER — Encounter: Payer: Self-pay | Admitting: Nurse Practitioner

## 2024-02-06 DIAGNOSIS — E876 Hypokalemia: Secondary | ICD-10-CM

## 2024-02-06 DIAGNOSIS — J9811 Atelectasis: Secondary | ICD-10-CM | POA: Diagnosis not present

## 2024-02-06 DIAGNOSIS — R051 Acute cough: Secondary | ICD-10-CM

## 2024-02-06 DIAGNOSIS — M797 Fibromyalgia: Secondary | ICD-10-CM

## 2024-02-06 DIAGNOSIS — I10 Essential (primary) hypertension: Secondary | ICD-10-CM | POA: Diagnosis not present

## 2024-02-06 DIAGNOSIS — E7800 Pure hypercholesterolemia, unspecified: Secondary | ICD-10-CM

## 2024-02-06 DIAGNOSIS — M1A9XX0 Chronic gout, unspecified, without tophus (tophi): Secondary | ICD-10-CM

## 2024-02-06 DIAGNOSIS — R7303 Prediabetes: Secondary | ICD-10-CM

## 2024-02-06 DIAGNOSIS — I5032 Chronic diastolic (congestive) heart failure: Secondary | ICD-10-CM | POA: Diagnosis not present

## 2024-02-06 DIAGNOSIS — R059 Cough, unspecified: Secondary | ICD-10-CM | POA: Insufficient documentation

## 2024-02-06 DIAGNOSIS — K219 Gastro-esophageal reflux disease without esophagitis: Secondary | ICD-10-CM | POA: Diagnosis not present

## 2024-02-06 DIAGNOSIS — R000 Tachycardia, unspecified: Secondary | ICD-10-CM | POA: Diagnosis not present

## 2024-02-06 DIAGNOSIS — J45909 Unspecified asthma, uncomplicated: Secondary | ICD-10-CM

## 2024-02-06 DIAGNOSIS — I872 Venous insufficiency (chronic) (peripheral): Secondary | ICD-10-CM | POA: Diagnosis not present

## 2024-02-06 NOTE — Assessment & Plan Note (Signed)
 Chronic diastolic heart failure, DOE, on and off wheezing, cough, occasional yellow phlegm in am. EF 60-65% 05/22/22, changed to Torsemide, on Amlodipine

## 2024-02-06 NOTE — Assessment & Plan Note (Signed)
 Venous insufficiency/Edema , takes Torsemide, Kcl.

## 2024-02-06 NOTE — Assessment & Plan Note (Signed)
 Hgb a1c 6.5 12/21/23,  prediabetic, on Steroids, monitor CBG

## 2024-02-06 NOTE — Progress Notes (Unsigned)
 Location:   [facility] Room Number: 925 Place of Service:  AL[facility] (13) Provider: Arna Snipe Vartan Kerins NP  Hoyte Ziebell X, NP  Patient Care Team: Mianna Iezzi X, [name]  Extended Emergency Contact Information Primary Emergency Contact: [name] [place] Home Phone: [phone] Mobile Phone: [phone] Relation: Daughter  Code Status: DNR Goals of care: Advanced Directive information    12/21/2023    8:00 PM  Advanced Directives  Does Patient Have a Medical Advance Directive? Yes  Type of Estate agent of [place];Living will  Does patient want to make changes to medical advance directive? No - Patient declined  Copy of Healthcare Power of Attorney in Chart? Yes - validated most recent copy scanned in chart (See row information)     Chief Complaint  Patient presents with  . Acute Visit    Persisted cough, rapid heart beats.     HPI:  Pt is a 89 y.o. female seen today for an acute visit for persisted cough with body aching, poor appetite, treated with Tamiflu, Robitussin, repeated COVID and Flu negative today. CXR 01/21/24 bronchiolitis.    Chronic diastolic heart failure, DOE, on and off wheezing, cough, occasional yellow phlegm in am. 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 Diclofenac, MRI completed, Neurosurgery consulted, not a good surgical candidate.                           Hgb a1c 6.5 12/21/23,  prediabetic, on Steroids, monitor CBG             The right middle finger stiffness, difficulty eating.  Hypokalemia, K 3.4 01/21/24,  on Kcl             Hx of sinus tachycardia, heart rate is not in control sometimes             Hx of acute sigmoid diverticulitis with pneumoperitoneum, f/u Dr. Sheliah Hatch, diarrhea on and off, negative C-diff while in the past.               Asthma Epinephrine prn, Allegra, Singulair, DuoNeb             RA/fibromyalgia, taking steroids, and Tramadol, TSH 0.865 02/27/22             GERD off acid reducer. Hgb 12.9 01/21/24             Venous insufficiency/Edema , takes Torsemide, Kcl.             OP Rheumatology, off Alendronate, on Vit D, t score -1.8 06/06/21             Hyperlipidemia, takes Atorvastatin, LDL 77 06/05/23             Vit B12 deficiency, on Vit B12 po, Vit B12 235 06/20/21             CT left adnexa cyst 11/2020, MRI 05/08/21,  05/22/22 renal US shoed No evidence of renal mass or hydronephrosis.             Gout, R ankle, stable, on Colchicine, Uric acid 6.5 02/05/24             Hx of DVT, venous US BLE was negative for DVT while in hospital. Off Eliquis.  HTN, on Amlodipine. Bun/creat 15/1.06 01/21/24    Past Medical History:  Diagnosis Date  . Adrenal failure (HCC)   . Arthritis   . Asthma   . Cancer (HCC)   . Cataract   . Closed nondisplaced fracture of fifth right metatarsal bone 09/18/2017  . Diverticulitis    Per patient  . E coli bacteremia   . Fibromyalgia 2008  . GERD (gastroesophageal reflux disease) 12/18/2016  . HCAP (healthcare-associated pneumonia) 02/03/2018  . Hyperlipidemia 10/13/2021  . Osteoporosis   . RA (rheumatoid arthritis) (HCC)   . Recurrent upper respiratory infection (URI)   . Sepsis due to urinary tract infection (HCC) 01/18/2018  . Urticaria    Past Surgical History:  Procedure Laterality Date  . BACK SURGERY    . BILATERAL CARPAL TUNNEL RELEASE  2005   right and left  . CATARACT EXTRACTION, BILATERAL  2004   right and left  . CERVICAL FUSION  2011,2010,2008   2 disks  . HEEL SPUR SURGERY  2004  . lower back fusion  2011   Fusion of 3-4 and 4-5 lower back  . RADIOFREQUENCY ABLATION  2020  . ROTATOR CUFF REPAIR  [id number]  . SQUAMOUS CELL CARCINOMA EXCISION    . TONSILLECTOMY AND ADENOIDECTOMY  1947  . TOTAL SHOULDER ARTHROPLASTY      Allergies   Allergen Reactions  . Adhesive [Tape] Rash  . Celebrex [Celecoxib] Hives  . Ciprofibrate Nausea Only  . Cymbalta [Duloxetine Hcl] Swelling  . Gabitril [Tiagabine] Swelling  . Lyrica [Pregabalin] Swelling  . Neurontin [Gabapentin] Swelling  . Nexium [Esomeprazole] Rash  . Nsaids Rash  . Shrimp [Shellfish Allergy] Anaphylaxis    Per patient "shrimp only"  . Azactam [Aztreonam]     Hand swelling   . Azelastine Hcl     Rash   . Ciprofloxacin Other (See Comments)    dizziness  . Claritin [Loratadine]     Irritability Nervousness   . Methotrexate Derivatives   . Nasacort [Triamcinolone]     Dizzy   . Olopatadine Other (See Comments)    Pain and lethargy   . Other   . Sulfamethizole Other (See Comments)    unknown  . Zantac [Ranitidine Hcl] Other (See Comments)    unknown  . Claritin-D 12 Hour [Loratadine-Pseudoephedrine Er] Anxiety  . Keflex [Cephalexin] Nausea And Vomiting    Tolerated Ancef  . Penicillins Rash    Injection site reaction. Tolerated cefepime in past. Also reports tolerating a penicillin infusion after this initial rxn ~20 yrs ago.  Has patient had a PCN reaction causing immediate rash, facial/tongue/throat swelling, SOB or lightheadedness with hypotension: No Has patient had a PCN reaction causing severe rash involving mucus membranes or skin necrosis: No Has patient had a PCN reaction that required hospitalization No Has patient had a PCN reaction occurring within the last 10 years: No    . Sulfa Antibiotics Nausea And Vomiting    Allergies as of 02/06/2024       Reactions   Adhesive [tape] Rash   Celebrex [celecoxib] Hives   Ciprofibrate Nausea Only   Cymbalta [duloxetine Hcl] Swelling   Gabitril [tiagabine] Swelling   Lyrica [pregabalin] Swelling   Neurontin [gabapentin] Swelling   Nexium [esomeprazole] Rash   Nsaids Rash   Shrimp [shellfish Allergy] Anaphylaxis   Per patient "shrimp only"   Azactam [aztreonam]    Hand swelling    Azelastine  Hcl    Rash    Ciprofloxacin Other (See Comments)   dizziness  Claritin [loratadine]    Irritability Nervousness    Methotrexate Derivatives    Nasacort [triamcinolone]    Dizzy    Olopatadine Other (See Comments)   Pain and lethargy    Other    Sulfamethizole Other (See Comments)   unknown   Zantac [ranitidine Hcl] Other (See Comments)   unknown   Claritin-d 12 Hour [loratadine-pseudoephedrine Er] Anxiety   Keflex [cephalexin] Nausea And Vomiting   Tolerated Ancef   Penicillins Rash   Injection site reaction. Tolerated cefepime in past. Also reports tolerating a penicillin infusion after this initial rxn ~20 yrs ago.  Has patient had a PCN reaction causing immediate rash, facial/tongue/throat swelling, SOB or lightheadedness with hypotension: No Has patient had a PCN reaction causing severe rash involving mucus membranes or skin necrosis: No Has patient had a PCN reaction that required hospitalization No Has patient had a PCN reaction occurring within the last 10 years: No   Sulfa Antibiotics Nausea And Vomiting        Medication List        Accurate as of February 06, 2024  3:59 PM. If you have any questions, ask your nurse or doctor.          acetaminophen 500 MG tablet Commonly known as: TYLENOL Take 650 mg by mouth in the morning and at bedtime.   amLODipine 5 MG tablet Commonly known as: NORVASC Take 5 mg by mouth daily.   atorvastatin 20 MG tablet Commonly known as: LIPITOR TAKE 1 TABLET BY MOUTH EVERY DAY   Biotin 5 MG Tbdp Take 1 tablet (5 mg total) by mouth daily.   colchicine 0.6 MG tablet TAKE 1 TABLET BY MOUTH EVERY DAY   CRANBERRY PO Take 1 capsule by mouth 2 (two) times daily.   cycloSPORINE 0.05 % ophthalmic emulsion Commonly known as: RESTASIS Place 1 drop into both eyes 2 (two) times daily.   D-Mannose 500 MG Caps Take 500 mg by mouth daily.   diclofenac Sodium 1 % Gel Commonly known as: VOLTAREN APPLY 2 TO 4 GRAMS TOPICALLY TO  AFFECTED JOINTS UP TO 4 TIMES DAILY   fexofenadine 180 MG tablet Commonly known as: ALLEGRA Take 1 tablet (180 mg total) by mouth daily.   ipratropium-albuterol 0.5-2.5 (3) MG/3ML Soln Commonly known as: DUONEB Take 3 mLs by nebulization as needed.   lansoprazole 30 MG capsule Commonly known as: PREVACID TAKE 1 CAPSULE BY MOUTH ONCE DAILY AT NOON What changed: See the new instructions.   montelukast 10 MG tablet Commonly known as: SINGULAIR TAKE 1 TABLET BY MOUTH EVERYDAY AT BEDTIME What changed: See the new instructions.   ondansetron 4 MG tablet Commonly known as: ZOFRAN Take 4 mg by mouth as needed for nausea or vomiting.   potassium chloride SA 20 MEQ tablet Commonly known as: KLOR-CON M Take 40 mEq by mouth 2 (two) times daily.   predniSONE 5 MG tablet Commonly known as: DELTASONE Take 5 mg by mouth daily with breakfast.   saccharomyces boulardii 250 MG capsule Commonly known as: FLORASTOR Take 250 mg by mouth 2 (two) times daily.   Torsemide 40 MG Tabs Take 40 mg by mouth daily.   traMADol 50 MG tablet Commonly known as: ULTRAM Take 1 tablet (50 mg total) by mouth every 6 (six) hours as needed (pain).   Vitamin D3 125 MCG (5000 UT) Caps TAKE 1 CAPSULE BY MOUTH EVERY DAY        Review of Systems  Constitutional:  Positive for appetite change  and fatigue. Negative for fever.  HENT:  Positive for congestion. Negative for sinus pressure and trouble swallowing.   Eyes:  Negative for visual disturbance.  Respiratory:  Positive for cough and shortness of breath. Negative for chest tightness and wheezing.        DOE, occasional yellow phlegm in am, hacking cough.   Cardiovascular:  Positive for leg swelling.  Gastrointestinal:  Negative for abdominal pain, diarrhea, nausea and vomiting.  Genitourinary:  Negative for dysuria, frequency and urgency.       Incontinent of urine.   Musculoskeletal:  Positive for arthralgias, back pain, gait problem and myalgias.        Right lower back hip pain, travels down to the right leg. Left groin/hip region pain with movement, able to walk if Tramadol use.   Skin:  Negative for color change.  Neurological:  Negative for speech difficulty, weakness and headaches.  Psychiatric/Behavioral:  Negative for confusion and sleep disturbance. The patient is not nervous/anxious.     Immunization History  Administered Date(s) Administered  . Fluad Quad(high Dose 65+) 09/30/2020  . Influenza, High Dose Seasonal PF 08/16/2018, 09/08/2019, 09/24/2020, 10/04/2021, 09/12/2023  . Influenza,inj,Quad PF,6+ Mos 08/11/2016  . Influenza-Unspecified 11/12/2017, 10/05/2022  . Moderna Covid-19 Vaccine Bivalent Booster 39yrs & up 10/04/2021, 09/26/2023  . Moderna Sars-Covid-2 Vaccination 12/15/2019, 01/12/2020, 10/19/2020  . PNEUMOCOCCAL CONJUGATE-20 10/04/2021  . PPD Test 03/01/2018  . Pneumococcal Conjugate-13 06/26/2017  . Pneumococcal Polysaccharide-23 12/11/2009  . Tdap 04/04/2023  . Zoster Recombinant(Shingrix) 01/28/2019, 09/02/2019   Pertinent  Health Maintenance Due  Topic Date Due  . INFLUENZA VACCINE  Completed  . DEXA SCAN  Completed      01/31/2023    1:08 PM 02/19/2023    2:29 PM 03/06/2023   11:57 AM 03/13/2023   10:48 AM 03/22/2023   10:53 AM  Fall Risk  Falls in the past year? 0 1 0 0 0  Was there an injury with Fall? 0 1 0 0 0  Fall Risk Category Calculator 0 3 0 0 0  Patient at Risk for Falls Due to Impaired balance/gait;Impaired mobility History of fall(s);Impaired balance/gait History of fall(s);Impaired balance/gait History of fall(s);Impaired balance/gait History of fall(s);Impaired balance/gait  Fall risk Follow up Falls evaluation completed Falls evaluation completed Falls evaluation completed Falls evaluation completed Falls evaluation completed   Functional Status Survey:    Vitals:   02/06/24 1534  BP: 119/67  Pulse: (!) 110  Resp: 18  Temp: 98.6 F (37 C)  SpO2: 95%   There is no height  or weight on file to calculate BMI. Physical Exam Vitals and nursing note reviewed.  Constitutional:      Comments: weak  HENT:     Head: Normocephalic and atraumatic.     Nose: Congestion and rhinorrhea present.     Mouth/Throat:     Mouth: Mucous membranes are moist.     Pharynx: No oropharyngeal exudate or posterior oropharyngeal erythema.  Eyes:     Extraocular Movements: Extraocular movements intact.     Conjunctiva/sclera: Conjunctivae normal.     Pupils: Pupils are equal, round, and reactive to light.  Cardiovascular:     Rate and Rhythm: Normal rate and regular rhythm.     Heart sounds: No murmur heard. Pulmonary:     Effort: Pulmonary effort is normal.     Breath sounds: No wheezing, rhonchi or rales.     Comments: Central congestion Abdominal:     General: Bowel sounds are normal.  Palpations: Abdomen is soft.     Tenderness: There is no abdominal tenderness.  Musculoskeletal:        General: No tenderness.     Cervical back: Normal range of motion and neck supple.     Right lower leg: Edema present.     Left lower leg: Edema present.     Comments: trace edema BLE  Skin:    General: Skin is warm and dry.     Comments: Lots of moles. Chronic erythema venous insufficiency skin changes BLE. Scabbed area right intergluteal cleft.   Neurological:     General: No focal deficit present.     Mental Status: She is alert and oriented to person, place, and time. Mental status is at baseline.     Gait: Gait abnormal.  Psychiatric:        Mood and Affect: Mood normal.        Behavior: Behavior normal.        Thought Content: Thought content normal.        Judgment: Judgment normal.    Labs reviewed: Recent Labs    12/21/23 1639 12/22/23 0338 12/24/23 0346 12/25/23 0356 12/26/23 0418  NA  --    < > 140 139 140  K  --    < > 3.2* 3.3* 3.2*  CL  --    < > 113* 110 107  CO2  --    < > 23 23 25   GLUCOSE  --    < > 85 83 87  BUN  --    < > 15 11 7*  CREATININE  1.07*   < > 0.84 0.78 0.77  CALCIUM  --    < > 8.8* 8.6* 8.9  MG 1.6*  --  2.3 2.2 2.1  PHOS 3.6  --   --   --   --    < > = values in this interval not displayed.   Recent Labs    12/21/23 0900 12/22/23 0338 12/26/23 0418  AST 50* 45* 52*  ALT 28 26 57*  ALKPHOS 75 57 61  BILITOT 0.8 0.6 0.8  PROT 7.0 5.2* 6.1*  ALBUMIN 3.3* 2.5* 2.8*   Recent Labs    12/21/23 0900 12/21/23 1639 12/22/23 0338 12/24/23 0346 12/26/23 0418  WBC 9.9   < > 7.5 8.1 10.0  NEUTROABS 6.8  --   --  3.5 4.7  HGB 13.7   < > 10.6* 10.3* 11.6*  HCT 44.7   < > 34.1* 32.8* 37.1  MCV 94.1   < > 93.9 92.4 92.8  PLT 226   < > 169 156 188   < > = values in this interval not displayed.   Lab Results  Component Value Date   TSH 0.865 02/27/2022   Lab Results  Component Value Date   HGBA1C 6.5 11/28/2023   Lab Results  Component Value Date   CHOL 151 06/05/2023   HDL 47 06/05/2023   LDLCALC 77 06/05/2023   TRIG 174 (A) 06/05/2023   CHOLHDL 2.6 04/06/2020    Significant Diagnostic Results in last 30 days:  No results found.  Assessment/Plan: Cough persisted cough with body aching, poor appetite, treated with Tamiflu, Robitussin, repeated COVID and Flu negative today. CXR 01/21/24 bronchiolitis.  Repeat CBC/diff, CMP/eGFR, CXR Empirical: Doxycycline 100mg  bid x 7 days, Tessalon 100mg  tid x 3 days, increase Prednisone 20mg  every day x 3 days.    Chronic diastolic heart failure (HCC) Chronic diastolic heart failure, DOE,  on and off wheezing, cough, occasional yellow phlegm in am. EF 60-65% 05/22/22, changed to Torsemide, on Amlodipine  Prediabetes Hgb a1c 6.5 12/21/23,  prediabetic, on Steroids, monitor CBG  Hypokalemia K 3.4 01/21/24,  on Kcl  Sinus tachycardia Hx of sinus tachycardia, heart rate is not in control sometimes  Asthma Asthma Epinephrine prn, Allegra, Singulair, DuoNeb  Fibromyalgia RA/fibromyalgia, taking steroids, and Tramadol, TSH 0.865 02/27/22  GERD (gastroesophageal  reflux disease) off acid reducer. Hgb 12.9 01/21/24  Edema of both lower extremities due to peripheral venous insufficiency   Venous insufficiency/Edema , takes Torsemide, Kcl.  Pure hypercholesterolemia takes Atorvastatin, LDL 77 06/05/23  Gout R ankle, stable, on Colchicine, Uric acid 6.5 02/05/24    Family/ staff Communication: plan of care reviewed with the patient and charge nurse.   Labs/tests ordered:  CBC/diff, CMP/eGFR, CXR ap/lateral.

## 2024-02-06 NOTE — Assessment & Plan Note (Signed)
 Asthma Epinephrine prn, Allegra, Singulair, DuoNeb

## 2024-02-06 NOTE — Assessment & Plan Note (Signed)
 Hx of sinus tachycardia, heart rate is not in control sometimes

## 2024-02-06 NOTE — Assessment & Plan Note (Signed)
 K 3.4 01/21/24,  on Kcl

## 2024-02-06 NOTE — Assessment & Plan Note (Signed)
 persisted cough with body aching, poor appetite, treated with Tamiflu, Robitussin, repeated COVID and Flu negative today. CXR 01/21/24 bronchiolitis.  Repeat CBC/diff, CMP/eGFR, CXR Empirical: Doxycycline 100mg  bid x 7 days, Tessalon 100mg  tid x 3 days, increase Prednisone 20mg  every day x 3 days.

## 2024-02-06 NOTE — Assessment & Plan Note (Signed)
 takes Atorvastatin, LDL 77 06/05/23

## 2024-02-06 NOTE — Assessment & Plan Note (Signed)
 Blood pressure is controlled, on Amlodipine. Bun/creat 15/1.06 01/21/24

## 2024-02-06 NOTE — Assessment & Plan Note (Signed)
 R ankle, stable, on Colchicine, Uric acid 6.5 02/05/24

## 2024-02-06 NOTE — Assessment & Plan Note (Signed)
RA/fibromyalgia, taking steroids, and Tramadol, TSH 0.865 02/27/22 

## 2024-02-06 NOTE — Assessment & Plan Note (Signed)
 off acid reducer. Hgb 12.9 01/21/24

## 2024-02-07 DIAGNOSIS — R29898 Other symptoms and signs involving the musculoskeletal system: Secondary | ICD-10-CM | POA: Diagnosis not present

## 2024-02-07 DIAGNOSIS — R2681 Unsteadiness on feet: Secondary | ICD-10-CM | POA: Diagnosis not present

## 2024-02-07 DIAGNOSIS — M6281 Muscle weakness (generalized): Secondary | ICD-10-CM | POA: Diagnosis not present

## 2024-02-07 DIAGNOSIS — R2689 Other abnormalities of gait and mobility: Secondary | ICD-10-CM | POA: Diagnosis not present

## 2024-02-11 DIAGNOSIS — R29898 Other symptoms and signs involving the musculoskeletal system: Secondary | ICD-10-CM | POA: Diagnosis not present

## 2024-02-11 DIAGNOSIS — M6281 Muscle weakness (generalized): Secondary | ICD-10-CM | POA: Diagnosis not present

## 2024-02-12 DIAGNOSIS — M6281 Muscle weakness (generalized): Secondary | ICD-10-CM | POA: Diagnosis not present

## 2024-02-12 DIAGNOSIS — R29898 Other symptoms and signs involving the musculoskeletal system: Secondary | ICD-10-CM | POA: Diagnosis not present

## 2024-02-14 DIAGNOSIS — M6281 Muscle weakness (generalized): Secondary | ICD-10-CM | POA: Diagnosis not present

## 2024-02-14 DIAGNOSIS — R29898 Other symptoms and signs involving the musculoskeletal system: Secondary | ICD-10-CM | POA: Diagnosis not present

## 2024-02-18 DIAGNOSIS — R29898 Other symptoms and signs involving the musculoskeletal system: Secondary | ICD-10-CM | POA: Diagnosis not present

## 2024-02-18 DIAGNOSIS — M6281 Muscle weakness (generalized): Secondary | ICD-10-CM | POA: Diagnosis not present

## 2024-02-20 DIAGNOSIS — R29898 Other symptoms and signs involving the musculoskeletal system: Secondary | ICD-10-CM | POA: Diagnosis not present

## 2024-02-20 DIAGNOSIS — M6281 Muscle weakness (generalized): Secondary | ICD-10-CM | POA: Diagnosis not present

## 2024-02-22 DIAGNOSIS — M6281 Muscle weakness (generalized): Secondary | ICD-10-CM | POA: Diagnosis not present

## 2024-02-22 DIAGNOSIS — R29898 Other symptoms and signs involving the musculoskeletal system: Secondary | ICD-10-CM | POA: Diagnosis not present

## 2024-02-25 DIAGNOSIS — M6281 Muscle weakness (generalized): Secondary | ICD-10-CM | POA: Diagnosis not present

## 2024-02-25 DIAGNOSIS — R29898 Other symptoms and signs involving the musculoskeletal system: Secondary | ICD-10-CM | POA: Diagnosis not present

## 2024-02-26 DIAGNOSIS — R29898 Other symptoms and signs involving the musculoskeletal system: Secondary | ICD-10-CM | POA: Diagnosis not present

## 2024-02-26 DIAGNOSIS — M6281 Muscle weakness (generalized): Secondary | ICD-10-CM | POA: Diagnosis not present

## 2024-02-27 DIAGNOSIS — M6281 Muscle weakness (generalized): Secondary | ICD-10-CM | POA: Diagnosis not present

## 2024-02-27 DIAGNOSIS — Z515 Encounter for palliative care: Secondary | ICD-10-CM | POA: Diagnosis not present

## 2024-02-27 DIAGNOSIS — R918 Other nonspecific abnormal finding of lung field: Secondary | ICD-10-CM | POA: Diagnosis not present

## 2024-02-27 DIAGNOSIS — R29898 Other symptoms and signs involving the musculoskeletal system: Secondary | ICD-10-CM | POA: Diagnosis not present

## 2024-02-27 DIAGNOSIS — I5032 Chronic diastolic (congestive) heart failure: Secondary | ICD-10-CM | POA: Diagnosis not present

## 2024-02-29 DIAGNOSIS — R29898 Other symptoms and signs involving the musculoskeletal system: Secondary | ICD-10-CM | POA: Diagnosis not present

## 2024-02-29 DIAGNOSIS — M6281 Muscle weakness (generalized): Secondary | ICD-10-CM | POA: Diagnosis not present

## 2024-03-04 DIAGNOSIS — M6281 Muscle weakness (generalized): Secondary | ICD-10-CM | POA: Diagnosis not present

## 2024-03-04 DIAGNOSIS — R29898 Other symptoms and signs involving the musculoskeletal system: Secondary | ICD-10-CM | POA: Diagnosis not present

## 2024-03-05 DIAGNOSIS — M6281 Muscle weakness (generalized): Secondary | ICD-10-CM | POA: Diagnosis not present

## 2024-03-05 DIAGNOSIS — R29898 Other symptoms and signs involving the musculoskeletal system: Secondary | ICD-10-CM | POA: Diagnosis not present

## 2024-03-06 DIAGNOSIS — M6281 Muscle weakness (generalized): Secondary | ICD-10-CM | POA: Diagnosis not present

## 2024-03-06 DIAGNOSIS — R29898 Other symptoms and signs involving the musculoskeletal system: Secondary | ICD-10-CM | POA: Diagnosis not present

## 2024-03-10 ENCOUNTER — Encounter: Payer: Self-pay | Admitting: Sports Medicine

## 2024-03-10 ENCOUNTER — Non-Acute Institutional Stay: Admitting: Sports Medicine

## 2024-03-10 DIAGNOSIS — N183 Chronic kidney disease, stage 3 unspecified: Secondary | ICD-10-CM | POA: Diagnosis not present

## 2024-03-10 DIAGNOSIS — R10814 Left lower quadrant abdominal tenderness: Secondary | ICD-10-CM | POA: Diagnosis not present

## 2024-03-10 DIAGNOSIS — R634 Abnormal weight loss: Secondary | ICD-10-CM | POA: Diagnosis not present

## 2024-03-10 DIAGNOSIS — R110 Nausea: Secondary | ICD-10-CM

## 2024-03-10 DIAGNOSIS — R109 Unspecified abdominal pain: Secondary | ICD-10-CM | POA: Diagnosis not present

## 2024-03-10 DIAGNOSIS — N178 Other acute kidney failure: Secondary | ICD-10-CM | POA: Diagnosis not present

## 2024-03-10 DIAGNOSIS — N390 Urinary tract infection, site not specified: Secondary | ICD-10-CM | POA: Diagnosis not present

## 2024-03-10 NOTE — Progress Notes (Signed)
 Provider:  Dr. Venita Sheffield Location:  [facility] Place of Service:   Assisted living   PCP: Mast, [name], NP [name], [name], [name]  Extended Emergency Contact Information Primary Emergency Contact: [name] [place] of [place] Home Phone: [phone] Mobile Phone: [phone] Relation: Daughter  Goals of Care: Advanced Directive information    12/21/2023    8:00 PM  Advanced Directives  Does Patient Have a Medical Advance Directive? Yes  Type of Estate agent of [place];Living will  Does patient want to make changes to medical advance directive? No - Patient declined  Copy of Healthcare Power of Attorney in Chart? Yes - validated most recent copy scanned in chart (See row information)       History of Present Illness          89 year old female with a past medical history of chronic diastolic heart failure, rheumatoid arthritis, fibromyalgia, hyperlipidemia, B12 deficiency, gout, hypertension is evaluated for acute visit. Staff reported that patient is feeling very tired and has not eaten lunch. Patient seen and examined in her room.  She is sitting in the couch.  She opens her eyes on calling her name.  She answers most of the questions in one-word answers.  She feels nauseated but has not thrown up.  Patient denies fevers, chills, runny nose, cough, sore throat, chest pain, shortness of breath,, dysuria, hematuria.    Past Medical History:  Diagnosis Date   Adrenal failure (HCC)    Arthritis    Asthma    Cancer (HCC)    Cataract    Closed nondisplaced fracture of fifth right metatarsal bone 09/18/2017   Diverticulitis    Per patient   E coli bacteremia    Fibromyalgia 2008   GERD (gastroesophageal reflux disease) 12/18/2016   HCAP (healthcare-associated pneumonia) 02/03/2018   Hyperlipidemia 10/13/2021   Osteoporosis    RA (rheumatoid arthritis) (HCC)     Recurrent upper respiratory infection (URI)    Sepsis due to urinary tract infection (HCC) 01/18/2018   Urticaria    Past Surgical History:  Procedure Laterality Date   BACK SURGERY     BILATERAL CARPAL TUNNEL RELEASE  2005   right and left   CATARACT EXTRACTION, BILATERAL  2004   right and left   CERVICAL FUSION  2011,2010,2008   2 disks   HEEL SPUR SURGERY  2004   lower back fusion  2011   Fusion of 3-4 and 4-5 lower back   RADIOFREQUENCY ABLATION  2020   ROTATOR CUFF REPAIR  [id number]   SQUAMOUS CELL CARCINOMA EXCISION     TONSILLECTOMY AND ADENOIDECTOMY  1947   TOTAL SHOULDER ARTHROPLASTY      reports that she quit smoking about 57 years ago. Her smoking use included cigarettes. She started smoking about 67 years ago. She has a 7.5 pack-year smoking history. She has never been exposed to tobacco smoke. She has never used smokeless tobacco. She reports that she does not drink alcohol and does not use drugs. Social History   Socioeconomic History   Marital status: Widowed    Spouse name: Not on file   Number of children: 2   Years of education: Not on file   Highest education level: Not on file  Occupational History   Occupation: Retired Child psychotherapist  Tobacco Use   Smoking status: Former    Current packs/day: 0.00    Average packs/day: 0.8 packs/day for 10.0  years (7.5 ttl pk-yrs)    Types: Cigarettes    Start date: 64    Quit date: 1968    Years since quitting: 57.2    Passive exposure: Never   Smokeless tobacco: Never  Vaping Use   Vaping status: Never Used  Substance and Sexual Activity   Alcohol use: No   Drug use: No   Sexual activity: Not Currently  Other Topics Concern   Not on file  Social History Narrative      Diet:        Do you drink/ eat things with caffeine? Dr. Reino Kent 2/ day      Marital status: Widowed                              What year were you married ? 1953      Do you live in a house, apartment,assistred living, condo,  trailer, etc.)? Apartment      Is it one or more stories?       How many persons live in your home ? 1      Do you have any pets in your home ?(please list) No      Highest Level of education completed: PhD       Current or past profession: Paramedic, Child psychotherapist, Special Educator       Do you exercise?   No                           Type & how often       ADVANCED DIRECTIVES (Please bring copies)      Do you have a living will? Yes      Do you have a DNR form?                       If not, do you want to discuss one? Yes      Do you have signed POA?HPOA forms?                 If so, please bring to your appointment Tes      FUNCTIONAL STATUS- To be completed by Spouse / child / Staff       Do you have difficulty bathing or dressing yourself ? No      Do you have difficulty preparing food or eating ? No      Do you have difficulty managing your mediation ? No      Do you have difficulty managing your finances ? No      Do you have difficulty affording your medication ? No      Social Drivers of Corporate investment banker Strain: Not on file  Food Insecurity: No Food Insecurity (12/21/2023)   Hunger Vital Sign    Worried About Running Out of Food in the Last Year: Never true    Ran Out of Food in the Last Year: Never true  Transportation Needs: No Transportation Needs (12/21/2023)   PRAPARE - Administrator, Civil Service (Medical): No    Lack of Transportation (Non-Medical): No  Physical Activity: Not on file  Stress: Not on file  Social Connections: Not on file  Intimate Partner Violence: Not At Risk (12/21/2023)   Humiliation, Afraid, Rape, and Kick questionnaire    Fear of Current or Ex-Partner: No    Emotionally Abused: No  Physically Abused: No    Sexually Abused: No    Functional Status Survey:    Family History  Problem Relation Age of Onset   Heart attack Maternal Grandmother    Heart attack Paternal Grandfather    Breast cancer  Mother 7   Diabetes Father    Heart disease Father    Congestive Heart Failure Father 8       Died from   Allergic rhinitis Neg Hx    Asthma Neg Hx    Eczema Neg Hx    Urticaria Neg Hx     Health Maintenance  Topic Date Due   COVID-19 Vaccine (6 - 2024-25 season) 11/21/2023   DTaP/Tdap/Td (2 - Td or Tdap) 04/03/2033   Pneumonia Vaccine 37+ Years old  Completed   INFLUENZA VACCINE  Completed   DEXA SCAN  Completed   Zoster Vaccines- Shingrix  Completed   HPV VACCINES  Aged Out    Allergies  Allergen Reactions   Adhesive [Tape] Rash   Celebrex [Celecoxib] Hives   Ciprofibrate Nausea Only   Cymbalta [Duloxetine Hcl] Swelling   Gabitril [Tiagabine] Swelling   Lyrica [Pregabalin] Swelling   Neurontin [Gabapentin] Swelling   Nexium [Esomeprazole] Rash   Nsaids Rash   Shrimp [Shellfish Allergy] Anaphylaxis    Per patient "shrimp only"   Azactam [Aztreonam]     Hand swelling    Azelastine Hcl     Rash    Ciprofloxacin Other (See Comments)    dizziness   Claritin [Loratadine]     Irritability Nervousness    Methotrexate Derivatives    Nasacort [Triamcinolone]     Dizzy    Olopatadine Other (See Comments)    Pain and lethargy    Other    Sulfamethizole Other (See Comments)    unknown   Zantac [Ranitidine Hcl] Other (See Comments)    unknown   Claritin-D 12 Hour [Loratadine-Pseudoephedrine Er] Anxiety   Keflex [Cephalexin] Nausea And Vomiting    Tolerated Ancef   Penicillins Rash    Injection site reaction. Tolerated cefepime in past. Also reports tolerating a penicillin infusion after this initial rxn ~20 yrs ago.  Has patient had a PCN reaction causing immediate rash, facial/tongue/throat swelling, SOB or lightheadedness with hypotension: No Has patient had a PCN reaction causing severe rash involving mucus membranes or skin necrosis: No Has patient had a PCN reaction that required hospitalization No Has patient had a PCN reaction occurring within the last 10  years: No     Sulfa Antibiotics Nausea And Vomiting    Outpatient Encounter Medications as of 03/10/2024  Medication Sig   acetaminophen (TYLENOL) 500 MG tablet Take 650 mg by mouth in the morning and at bedtime.   amLODipine (NORVASC) 5 MG tablet Take 5 mg by mouth daily.   atorvastatin (LIPITOR) 20 MG tablet TAKE 1 TABLET BY MOUTH EVERY DAY   Biotin 5 MG TBDP Take 1 tablet (5 mg total) by mouth daily.   Cholecalciferol (VITAMIN D3) 125 MCG (5000 UT) CAPS TAKE 1 CAPSULE BY MOUTH EVERY DAY   colchicine 0.6 MG tablet TAKE 1 TABLET BY MOUTH EVERY DAY   CRANBERRY PO Take 1 capsule by mouth 2 (two) times daily.   cycloSPORINE (RESTASIS) 0.05 % ophthalmic emulsion Place 1 drop into both eyes 2 (two) times daily.   D-Mannose 500 MG CAPS Take 500 mg by mouth daily.   diclofenac Sodium (VOLTAREN) 1 % GEL APPLY 2 TO 4 GRAMS TOPICALLY TO AFFECTED JOINTS UP TO 4  TIMES DAILY   fexofenadine (ALLEGRA) 180 MG tablet Take 1 tablet (180 mg total) by mouth daily.   ipratropium-albuterol (DUONEB) 0.5-2.5 (3) MG/3ML SOLN Take 3 mLs by nebulization as needed.   lansoprazole (PREVACID) 30 MG capsule TAKE 1 CAPSULE BY MOUTH ONCE DAILY AT NOON (Patient taking differently: Take 30 mg by mouth daily in the afternoon.)   montelukast (SINGULAIR) 10 MG tablet TAKE 1 TABLET BY MOUTH EVERYDAY AT BEDTIME (Patient taking differently: Take 10 mg by mouth at bedtime.)   ondansetron (ZOFRAN) 4 MG tablet Take 4 mg by mouth as needed for nausea or vomiting.   potassium chloride SA (KLOR-CON M) 20 MEQ tablet Take 40 mEq by mouth 2 (two) times daily.   predniSONE (DELTASONE) 5 MG tablet Take 5 mg by mouth daily with breakfast.   saccharomyces boulardii (FLORASTOR) 250 MG capsule Take 250 mg by mouth 2 (two) times daily.   Torsemide 40 MG TABS Take 40 mg by mouth daily.   traMADol (ULTRAM) 50 MG tablet Take 1 tablet (50 mg total) by mouth every 6 (six) hours as needed (pain).   No facility-administered encounter medications on  file as of 03/10/2024.    Review of Systems Negative unless indicated in HPI.  There were no vitals filed for this visit. There is no height or weight on file to calculate BMI. BP Readings from Last 3 Encounters:  02/06/24 119/67  01/21/24 (!) 143/68  01/07/24 132/70   Wt Readings from Last 3 Encounters:  01/07/24 162 lb (73.5 kg)  12/31/23 162 lb (73.5 kg)  12/26/23 171 lb 15.3 oz (78 kg)   Physical Exam Constitutional:      Appearance: Normal appearance.  HENT:     Head: Normocephalic and atraumatic.  Cardiovascular:     Rate and Rhythm: Normal rate and regular rhythm.  Pulmonary:     Effort: Pulmonary effort is normal. No respiratory distress.     Breath sounds: Normal breath sounds. No wheezing.  Abdominal:     General: Bowel sounds are normal. There is distension.     Tenderness: There is no abdominal tenderness. There is no guarding or rebound.     Comments:    Musculoskeletal:        General: No swelling or tenderness.  Neurological:     Mental Status: She is alert. Mental status is at baseline.     Motor: No weakness.     Labs reviewed: Basic Metabolic Panel: Recent Labs    12/21/23 1639 12/22/23 0338 12/24/23 0346 12/25/23 0356 12/26/23 0418  NA  --    < > 140 139 140  K  --    < > 3.2* 3.3* 3.2*  CL  --    < > 113* 110 107  CO2  --    < > 23 23 25   GLUCOSE  --    < > 85 83 87  BUN  --    < > 15 11 7*  CREATININE 1.07*   < > 0.84 0.78 0.77  CALCIUM  --    < > 8.8* 8.6* 8.9  MG 1.6*  --  2.3 2.2 2.1  PHOS 3.6  --   --   --   --    < > = values in this interval not displayed.   Liver Function Tests: Recent Labs    12/21/23 0900 12/22/23 0338 12/26/23 0418  AST 50* 45* 52*  ALT 28 26 57*  ALKPHOS 75 57 61  BILITOT 0.8 0.6  0.8  PROT 7.0 5.2* 6.1*  ALBUMIN 3.3* 2.5* 2.8*   Recent Labs    12/21/23 1639  LIPASE 20   Recent Labs    12/21/23 1130  AMMONIA 23   CBC: Recent Labs    12/21/23 0900 12/21/23 1639 12/22/23 0338  12/24/23 0346 12/26/23 0418  WBC 9.9   < > 7.5 8.1 10.0  NEUTROABS 6.8  --   --  3.5 4.7  HGB 13.7   < > 10.6* 10.3* 11.6*  HCT 44.7   < > 34.1* 32.8* 37.1  MCV 94.1   < > 93.9 92.4 92.8  PLT 226   < > 169 156 188   < > = values in this interval not displayed.   Cardiac Enzymes: No results for input(s): "CKTOTAL", "CKMB", "CKMBINDEX", "TROPONINI" in the last 8760 hours. BNP: Invalid input(s): "POCBNP" Lab Results  Component Value Date   HGBA1C 6.5 11/28/2023   Lab Results  Component Value Date   TSH 0.865 02/27/2022   Lab Results  Component Value Date   VITAMINB12 1,377 (H) 05/26/2022   Lab Results  Component Value Date   FOLATE 26.8 02/06/2018   Lab Results  Component Value Date   IRON 89 02/06/2018   TIBC 281 02/06/2018   FERRITIN 74 02/06/2018    Imaging and Procedures obtained prior to SNF admission: ECHOCARDIOGRAM COMPLETE Result Date: 12/21/2023    ECHOCARDIOGRAM REPORT   Patient Name:   Eileen Wilkerson Date of Exam: 12/21/2023 Medical Rec #:  [id number]          Height:       64.0 in Accession #:    [id number]         Weight:       162.0 lb Date of Birth:  11/12/1932          BSA:          1.789 m Patient Age:    89 years           BP:           97/58 mmHg Patient Gender: F                  HR:           76 bpm. Exam Location:  Inpatient Procedure: 2D Echo, Cardiac Doppler and Color Doppler STAT ECHO Indications:    Shock  History:        Patient has prior history of Echocardiogram examinations, most                 recent 05/22/2022. CHF, Arrythmias:Tachycardia,                 Signs/Symptoms:Shortness of Breath; Risk Factors:Dyslipidemia                 and Hypertension.  Sonographer:    Vern Claude Referring Phys: [id number] Bettina Gavia DEWALD IMPRESSIONS  1. Left ventricular ejection fraction, by estimation, is 55 to 60%. The left ventricle has normal function. The left ventricle has no regional wall motion abnormalities. Left ventricular diastolic parameters are  consistent with Grade I diastolic dysfunction (impaired relaxation).  2. Right ventricular systolic function is normal. The right ventricular size is normal. Tricuspid regurgitation signal is inadequate for assessing PA pressure.  3. The mitral valve is normal in structure. No evidence of mitral valve regurgitation. No evidence of mitral stenosis.  4. The aortic valve is tricuspid. There is mild calcification of the aortic valve. Aortic valve  regurgitation is not visualized. No aortic stenosis is present.  5. The inferior vena cava is normal in size with greater than 50% respiratory variability, suggesting right atrial pressure of 3 mmHg. FINDINGS  Left Ventricle: Left ventricular ejection fraction, by estimation, is 55 to 60%. The left ventricle has normal function. The left ventricle has no regional wall motion abnormalities. The left ventricular internal cavity size was normal in size. There is  no left ventricular hypertrophy. Left ventricular diastolic parameters are consistent with Grade I diastolic dysfunction (impaired relaxation). Right Ventricle: The right ventricular size is normal. No increase in right ventricular wall thickness. Right ventricular systolic function is normal. Tricuspid regurgitation signal is inadequate for assessing PA pressure. Left Atrium: Left atrial size was normal in size. Right Atrium: Right atrial size was normal in size. Pericardium: There is no evidence of pericardial effusion. Mitral Valve: The mitral valve is normal in structure. No evidence of mitral valve regurgitation. No evidence of mitral valve stenosis. MV peak gradient, 2.5 mmHg. The mean mitral valve gradient is 1.0 mmHg. Tricuspid Valve: The tricuspid valve is normal in structure. Tricuspid valve regurgitation is not demonstrated. Aortic Valve: The aortic valve is tricuspid. There is mild calcification of the aortic valve. Aortic valve regurgitation is not visualized. No aortic stenosis is present. Aortic valve mean  gradient measures 6.0 mmHg. Aortic valve peak gradient measures 11.4 mmHg. Aortic valve area, by VTI measures 2.06 cm. Pulmonic Valve: The pulmonic valve was normal in structure. Pulmonic valve regurgitation is not visualized. Aorta: The aortic root is normal in size and structure. Venous: The inferior vena cava is normal in size with greater than 50% respiratory variability, suggesting right atrial pressure of 3 mmHg. IAS/Shunts: No atrial level shunt detected by color flow Doppler.  LEFT VENTRICLE PLAX 2D LVIDd:         3.90 cm      Diastology LVIDs:         3.00 cm      LV e' medial:    6.85 cm/s LV PW:         0.70 cm      LV E/e' medial:  9.8 LV IVS:        0.80 cm      LV e' lateral:   8.05 cm/s LVOT diam:     1.80 cm      LV E/e' lateral: 8.3 LV SV:         64 LV SV Index:   36 LVOT Area:     2.54 cm  LV Volumes (MOD) LV vol d, MOD A2C: 104.0 ml LV vol d, MOD A4C: 112.0 ml LV vol s, MOD A2C: 34.3 ml LV vol s, MOD A4C: 50.3 ml LV SV MOD A2C:     69.7 ml LV SV MOD A4C:     112.0 ml LV SV MOD BP:      67.4 ml RIGHT VENTRICLE             IVC RV Basal diam:  3.80 cm     IVC diam: 1.70 cm RV Mid diam:    3.70 cm RV S prime:     13.50 cm/s TAPSE (M-mode): 2.6 cm LEFT ATRIUM             Index        RIGHT ATRIUM          Index LA diam:        3.10 cm 1.73 cm/m   RA Area:  9.11 cm LA Vol (A2C):   22.4 ml 12.52 ml/m  RA Volume:   17.40 ml 9.73 ml/m LA Vol (A4C):   25.1 ml 14.03 ml/m LA Biplane Vol: 25.9 ml 14.48 ml/m  AORTIC VALVE                     PULMONIC VALVE AV Area (Vmax):    2.02 cm      PV Vmax:       0.78 m/s AV Area (Vmean):   1.51 cm      PV Peak grad:  2.4 mmHg AV Area (VTI):     2.06 cm AV Vmax:           169.00 cm/s AV Vmean:          119.000 cm/s AV VTI:            0.309 m AV Peak Grad:      11.4 mmHg AV Mean Grad:      6.0 mmHg LVOT Vmax:         134.00 cm/s LVOT Vmean:        70.800 cm/s LVOT VTI:          0.250 m LVOT/AV VTI ratio: 0.81  AORTA Ao Root diam: 2.80 cm Ao Asc diam:  2.90  cm MITRAL VALVE MV Area (PHT): 3.12 cm    SHUNTS MV Area VTI:   2.37 cm    Systemic VTI:  0.25 m MV Peak grad:  2.5 mmHg    Systemic Diam: 1.80 cm MV Mean grad:  1.0 mmHg MV Vmax:       0.79 m/s MV Vmean:      51.8 cm/s MV Decel Time: 243 msec MV E velocity: 67.00 cm/s MV A velocity: 62.90 cm/s MV E/A ratio:  1.07 Dalton McleanMD Electronically signed by Wilfred Lacy Signature Date/Time: 12/21/2023/3:10:49 PM    Final    DG Hip Unilat W or Wo Pelvis 2-3 Views Left Result Date: 12/21/2023 CLINICAL DATA:  Left hip pain. EXAM: DG HIP (WITH OR WITHOUT PELVIS) 2-3V LEFT COMPARISON:  None Available. FINDINGS: There is no acute fracture or dislocation.Evaluation for fracture is limited due to osteopenia and body habitus. There is moderate left and severe right hip osteoarthritic changes. There is near complete loss of right hip joint space with near bone on bone contact. Excreted contrast noted in the urinary bladder. The soft tissues are unremarkable. IMPRESSION: 1. No acute fracture or dislocation. 2. Moderate left and severe right hip osteoarthritis. Electronically Signed   By: Elgie Collard M.D.   On: 12/21/2023 12:38   CT Angio Chest PE W and/or Wo Contrast Result Date: 12/21/2023 CLINICAL DATA:  Pulmonary embolism (PE) suspected, high prob; Epigastric pain sepsis. Nausea and lethargy. EXAM: CT ANGIOGRAPHY CHEST CT ABDOMEN AND PELVIS WITH CONTRAST TECHNIQUE: Multidetector CT imaging of the chest was performed using the standard protocol during bolus administration of intravenous contrast. Multiplanar CT image reconstructions and MIPs were obtained to evaluate the vascular anatomy. Multidetector CT imaging of the abdomen and pelvis was performed using the standard protocol during bolus administration of intravenous contrast. RADIATION DOSE REDUCTION: This exam was performed according to the departmental dose-optimization program which includes automated exposure control, adjustment of the mA and/or kV  according to patient size and/or use of iterative reconstruction technique. CONTRAST:  75mL OMNIPAQUE IOHEXOL 350 MG/ML SOLN COMPARISON:  CT scan abdomen and pelvis from 02/12/2023. FINDINGS: CTA CHEST FINDINGS Cardiovascular: No evidence of embolism to the proximal  subsegmental pulmonary artery level. Normal cardiac size. No pericardial effusion. No aortic aneurysm. There are coronary artery calcifications, in keeping with coronary artery disease. Mediastinum/Nodes: Visualized thyroid gland appears grossly unremarkable. No solid / cystic mediastinal masses. The esophagus is nondistended precluding optimal assessment. There are few mildly prominent mediastinal and hilar lymph nodes, which do not meet the size criteria for lymphadenopathy. No axillary lymphadenopathy by size criteria. Lungs/Pleura: The central tracheo-bronchial tree is patent. There is mild, smooth, circumferential thickening of the segmental and subsegmental bronchial walls, throughout bilateral lungs, which is nonspecific. Findings are most commonly seen with bronchitis or reactive airway disease, such as asthma. There are occlusive and nonocclusive filling defects in the distal segmental and subsegmental bronchi, throughout bilateral lungs, likely due to mucus/secretions or aspiration. There are multiple solid noncalcified nodules throughout bilateral lungs with largest in the anterior left upper lobe measuring up to 1.8 x 1.8 cm. This nodule exhibits small central cavitation. Rest of the nodules are smaller in size and do not exhibit cavitations. These are compatible with metastases. No consolidation, pleural effusion or pneumothorax. There are dependent changes in bilateral lungs. Musculoskeletal: The visualized soft tissues of the chest wall are grossly unremarkable. No suspicious osseous lesions. There are mild multilevel degenerative changes in the visualized spine. Review of the MIP images confirms the above findings. CT ABDOMEN and PELVIS  FINDINGS Hepatobiliary: The liver is normal in size. Non-cirrhotic configuration. No suspicious mass. No intrahepatic or extrahepatic bile duct dilation. No calcified gallstones. Normal gallbladder wall thickness. No pericholecystic inflammatory changes. Pancreas: Unremarkable. No pancreatic ductal dilatation or surrounding inflammatory changes. Spleen: Within normal limits. No focal lesion. Adrenals/Urinary Tract: Adrenal glands are unremarkable. No suspicious renal mass. There is a 2.3 x 2.4 cm simple cyst in the left kidney interpolar region, anteriorly. There are additional several smaller cysts throughout bilateral kidneys. No hydronephrosis. No renal or ureteric calculi. Unremarkable urinary bladder. Stomach/Bowel: There is a small sliding hiatal hernia. No disproportionate dilation of the small or large bowel loops. No evidence of abnormal bowel wall thickening or inflammatory changes. The appendix is unremarkable. There are scattered diverticula mainly in the sigmoid colon, without imaging signs of diverticulitis. Vascular/Lymphatic: No ascites or pneumoperitoneum. No abdominal or pelvic lymphadenopathy, by size criteria. No aneurysmal dilation of the major abdominal arteries. There are mild peripheral atherosclerotic vascular calcifications of the aorta and its major branches. Reproductive: The uterus is unremarkable. There is a 3.5 x 4.3 cm hypoattenuating structure in the left adnexa, most likely ovarian in etiology. The structure exhibits fluid attenuation and has no discrete mural nodule. Even though this is incompletely characterized on the current exam, favored to represent a senescent ovarian cyst. Annual follow-up ultrasound evaluation is recommended to documenting stability. Other: There is a tiny fat containing umbilical hernia. The soft tissues and abdominal wall are otherwise unremarkable. Musculoskeletal: No suspicious osseous lesions. There are moderate multilevel degenerative changes in the  visualized spine. Review of the MIP images confirms the above findings. IMPRESSION: 1. No embolism to the proximal subsegmental pulmonary artery level. 2. There are multiple solid noncalcified lung nodules, compatible with metastases. 3. No acute inflammatory process identified within the abdomen or pelvis. 4. Multiple other nonacute observations, as described above. Electronically Signed   By: Jules Schick M.D.   On: 12/21/2023 11:18   CT ABDOMEN PELVIS W CONTRAST Result Date: 12/21/2023 CLINICAL DATA:  Pulmonary embolism (PE) suspected, high prob; Epigastric pain sepsis. Nausea and lethargy. EXAM: CT ANGIOGRAPHY CHEST CT ABDOMEN AND PELVIS WITH CONTRAST  TECHNIQUE: Multidetector CT imaging of the chest was performed using the standard protocol during bolus administration of intravenous contrast. Multiplanar CT image reconstructions and MIPs were obtained to evaluate the vascular anatomy. Multidetector CT imaging of the abdomen and pelvis was performed using the standard protocol during bolus administration of intravenous contrast. RADIATION DOSE REDUCTION: This exam was performed according to the departmental dose-optimization program which includes automated exposure control, adjustment of the mA and/or kV according to patient size and/or use of iterative reconstruction technique. CONTRAST:  75mL OMNIPAQUE IOHEXOL 350 MG/ML SOLN COMPARISON:  CT scan abdomen and pelvis from 02/12/2023. FINDINGS: CTA CHEST FINDINGS Cardiovascular: No evidence of embolism to the proximal subsegmental pulmonary artery level. Normal cardiac size. No pericardial effusion. No aortic aneurysm. There are coronary artery calcifications, in keeping with coronary artery disease. Mediastinum/Nodes: Visualized thyroid gland appears grossly unremarkable. No solid / cystic mediastinal masses. The esophagus is nondistended precluding optimal assessment. There are few mildly prominent mediastinal and hilar lymph nodes, which do not meet the  size criteria for lymphadenopathy. No axillary lymphadenopathy by size criteria. Lungs/Pleura: The central tracheo-bronchial tree is patent. There is mild, smooth, circumferential thickening of the segmental and subsegmental bronchial walls, throughout bilateral lungs, which is nonspecific. Findings are most commonly seen with bronchitis or reactive airway disease, such as asthma. There are occlusive and nonocclusive filling defects in the distal segmental and subsegmental bronchi, throughout bilateral lungs, likely due to mucus/secretions or aspiration. There are multiple solid noncalcified nodules throughout bilateral lungs with largest in the anterior left upper lobe measuring up to 1.8 x 1.8 cm. This nodule exhibits small central cavitation. Rest of the nodules are smaller in size and do not exhibit cavitations. These are compatible with metastases. No consolidation, pleural effusion or pneumothorax. There are dependent changes in bilateral lungs. Musculoskeletal: The visualized soft tissues of the chest wall are grossly unremarkable. No suspicious osseous lesions. There are mild multilevel degenerative changes in the visualized spine. Review of the MIP images confirms the above findings. CT ABDOMEN and PELVIS FINDINGS Hepatobiliary: The liver is normal in size. Non-cirrhotic configuration. No suspicious mass. No intrahepatic or extrahepatic bile duct dilation. No calcified gallstones. Normal gallbladder wall thickness. No pericholecystic inflammatory changes. Pancreas: Unremarkable. No pancreatic ductal dilatation or surrounding inflammatory changes. Spleen: Within normal limits. No focal lesion. Adrenals/Urinary Tract: Adrenal glands are unremarkable. No suspicious renal mass. There is a 2.3 x 2.4 cm simple cyst in the left kidney interpolar region, anteriorly. There are additional several smaller cysts throughout bilateral kidneys. No hydronephrosis. No renal or ureteric calculi. Unremarkable urinary bladder.  Stomach/Bowel: There is a small sliding hiatal hernia. No disproportionate dilation of the small or large bowel loops. No evidence of abnormal bowel wall thickening or inflammatory changes. The appendix is unremarkable. There are scattered diverticula mainly in the sigmoid colon, without imaging signs of diverticulitis. Vascular/Lymphatic: No ascites or pneumoperitoneum. No abdominal or pelvic lymphadenopathy, by size criteria. No aneurysmal dilation of the major abdominal arteries. There are mild peripheral atherosclerotic vascular calcifications of the aorta and its major branches. Reproductive: The uterus is unremarkable. There is a 3.5 x 4.3 cm hypoattenuating structure in the left adnexa, most likely ovarian in etiology. The structure exhibits fluid attenuation and has no discrete mural nodule. Even though this is incompletely characterized on the current exam, favored to represent a senescent ovarian cyst. Annual follow-up ultrasound evaluation is recommended to documenting stability. Other: There is a tiny fat containing umbilical hernia. The soft tissues and abdominal wall are otherwise  unremarkable. Musculoskeletal: No suspicious osseous lesions. There are moderate multilevel degenerative changes in the visualized spine. Review of the MIP images confirms the above findings. IMPRESSION: 1. No embolism to the proximal subsegmental pulmonary artery level. 2. There are multiple solid noncalcified lung nodules, compatible with metastases. 3. No acute inflammatory process identified within the abdomen or pelvis. 4. Multiple other nonacute observations, as described above. Electronically Signed   By: Jules Schick M.D.   On: 12/21/2023 11:18   DG Chest Port 1 View Result Date: 12/21/2023 CLINICAL DATA:  89 year old female with possible sepsis. EXAM: PORTABLE CHEST 1 VIEW COMPARISON:  Portable chest 02/12/2023 and earlier. FINDINGS: Portable AP semi upright view at 0844 hours. Low lung volumes not significantly  changed from last year. Stable cardiac size and mediastinal contours. Visualized tracheal air column is within normal limits. Allowing for portable technique the lungs are clear. Chronic cervical ACDF. Stable visualized osseous structures. Negative visible bowel gas. IMPRESSION: Stable with chronically low lung volumes. No acute cardiopulmonary abnormality. Electronically Signed   By: Odessa Fleming M.D.   On: 12/21/2023 09:02    Assessment and Plan         1. Left lower quadrant abdominal tenderness without rebound tenderness (Primary)   2. Nausea  Patient is feeling nauseous Decreased p.o. intake Lower abdominal tenderness+ Will get labs CBC, BMP, UA, urine culture, x-ray KUB Will monitor vitals every shift Continue with Zofran every 6 as needed as needed.      Family/ staff Communication:   Labs/tests ordered:  Venita Sheffield

## 2024-03-11 ENCOUNTER — Non-Acute Institutional Stay: Payer: Self-pay | Admitting: Adult Health

## 2024-03-11 ENCOUNTER — Encounter: Payer: Self-pay | Admitting: Adult Health

## 2024-03-11 DIAGNOSIS — K5901 Slow transit constipation: Secondary | ICD-10-CM

## 2024-03-11 DIAGNOSIS — M1A9XX0 Chronic gout, unspecified, without tophus (tophi): Secondary | ICD-10-CM | POA: Diagnosis not present

## 2024-03-11 DIAGNOSIS — I10 Essential (primary) hypertension: Secondary | ICD-10-CM

## 2024-03-11 DIAGNOSIS — G8929 Other chronic pain: Secondary | ICD-10-CM | POA: Diagnosis not present

## 2024-03-11 DIAGNOSIS — M6281 Muscle weakness (generalized): Secondary | ICD-10-CM | POA: Diagnosis not present

## 2024-03-11 DIAGNOSIS — R29898 Other symptoms and signs involving the musculoskeletal system: Secondary | ICD-10-CM | POA: Diagnosis not present

## 2024-03-11 DIAGNOSIS — M0579 Rheumatoid arthritis with rheumatoid factor of multiple sites without organ or systems involvement: Secondary | ICD-10-CM | POA: Diagnosis not present

## 2024-03-11 MED ORDER — DOCUSATE SODIUM 100 MG PO CAPS: 100.0000 mg | ORAL_CAPSULE | Freq: Two times a day (BID) | ORAL | Status: AC

## 2024-03-11 NOTE — Progress Notes (Signed)
 Location:  [facility] Room Number: 925 A Place of Service:  ALF (13) Provider:  Kenard Gower, DNP, FNP-BC  [name], [name], [name]  Extended Emergency Contact Information Primary Emergency Contact: Irish Lack of [place] Home Phone: [phone] Mobile Phone: [phone] Relation: Daughter  Code Status:   Full Code  Goals of care: Advanced Directive information    12/21/2023    8:00 PM  Advanced Directives  Does Patient Have a Medical Advance Directive? Yes  Type of Estate agent of [place];Living will  Does patient want to make changes to medical advance directive? No - Patient declined  Copy of Healthcare Power of Attorney in Chart? Yes - validated most recent copy scanned in chart (See row information)     Chief Complaint  Patient presents with   Acute Visit    Constipation    HPI:  Pt is a 89 y.o. female seen today for an acute visit regarding constipation.  She is a resident of [facility]. She experienced left lower quadrant pain yesterday, prompting abdominal imaging. The imaging revealed no acute abdominal process but indicated a non-obstructive, nonspecific bowel gas pattern with a mild to moderate fecal load, suggesting mild constipation. She had a bowel movement yesterday, and her left lower quadrant abdominal pain has since improved. She currently denies any pain and has no nausea today.  Her current medication regimen includes tramadol 50 mg twice daily and an additional 50 mg every six hours as needed for pain, along with acetaminophen 650 mg three times daily for chronic pain. She does not use any stool softeners.  She wears a palm guard on her right hand due to arthritis, which has resulted in arthritic deformities. She takes prednisone 5 mg daily for arthritis management.  For gout, she takes  allopurinol 100 mg daily and has not experienced any recent gout attacks.  Her blood pressure is managed with amlodipine 5 mg daily, currently stable at 138/78 mmHg.  No fever, chills, cough, or nausea.   Past Medical History:  Diagnosis Date   Adrenal failure (HCC)    Arthritis    Asthma    Cancer (HCC)    Cataract    Closed nondisplaced fracture of fifth right metatarsal bone 09/18/2017   Diverticulitis    Per patient   E coli bacteremia    Fibromyalgia 2008   GERD (gastroesophageal reflux disease) 12/18/2016   HCAP (healthcare-associated pneumonia) 02/03/2018   Hyperlipidemia 10/13/2021   Osteoporosis    RA (rheumatoid arthritis) (HCC)    Recurrent upper respiratory infection (URI)    Sepsis due to urinary tract infection (HCC) 01/18/2018   Urticaria    Past Surgical History:  Procedure Laterality Date   BACK SURGERY     BILATERAL CARPAL TUNNEL RELEASE  2005   right and left   CATARACT EXTRACTION, BILATERAL  2004   right and left   CERVICAL FUSION  2011,2010,2008   2 disks   HEEL SPUR SURGERY  2004   lower back fusion  2011   Fusion of 3-4 and 4-5 lower back   RADIOFREQUENCY ABLATION  2020   ROTATOR CUFF REPAIR  [id number]   SQUAMOUS CELL CARCINOMA EXCISION     TONSILLECTOMY AND ADENOIDECTOMY  1947   TOTAL SHOULDER ARTHROPLASTY      Allergies  Allergen Reactions   Adhesive [Tape] Rash   Celebrex [Celecoxib] Hives   Ciprofibrate  Nausea Only   Cymbalta [Duloxetine Hcl] Swelling   Gabitril [Tiagabine] Swelling   Lyrica [Pregabalin] Swelling   Neurontin [Gabapentin] Swelling   Nexium [Esomeprazole] Rash   Nsaids Rash   Shrimp [Shellfish Allergy] Anaphylaxis    Per patient "shrimp only"   Azactam [Aztreonam]     Hand swelling    Azelastine Hcl     Rash    Ciprofloxacin Other (See Comments)    dizziness   Claritin [Loratadine]     Irritability Nervousness    Methotrexate Derivatives    Nasacort [Triamcinolone]     Dizzy    Olopatadine Other (See  Comments)    Pain and lethargy    Other    Sulfamethizole Other (See Comments)    unknown   Zantac [Ranitidine Hcl] Other (See Comments)    unknown   Claritin-D 12 Hour [Loratadine-Pseudoephedrine Er] Anxiety   Keflex [Cephalexin] Nausea And Vomiting    Tolerated Ancef   Penicillins Rash    Injection site reaction. Tolerated cefepime in past. Also reports tolerating a penicillin infusion after this initial rxn ~20 yrs ago.  Has patient had a PCN reaction causing immediate rash, facial/tongue/throat swelling, SOB or lightheadedness with hypotension: No Has patient had a PCN reaction causing severe rash involving mucus membranes or skin necrosis: No Has patient had a PCN reaction that required hospitalization No Has patient had a PCN reaction occurring within the last 10 years: No     Sulfa Antibiotics Nausea And Vomiting    Outpatient Encounter Medications as of 03/11/2024  Medication Sig   acetaminophen (TYLENOL) 500 MG tablet Take 650 mg by mouth in the morning and at bedtime.   amLODipine (NORVASC) 5 MG tablet Take 5 mg by mouth daily.   atorvastatin (LIPITOR) 20 MG tablet TAKE 1 TABLET BY MOUTH EVERY DAY   Biotin 5 MG TBDP Take 1 tablet (5 mg total) by mouth daily.   Cholecalciferol (VITAMIN D3) 125 MCG (5000 UT) CAPS TAKE 1 CAPSULE BY MOUTH EVERY DAY   colchicine 0.6 MG tablet TAKE 1 TABLET BY MOUTH EVERY DAY   CRANBERRY PO Take 1 capsule by mouth 2 (two) times daily.   cycloSPORINE (RESTASIS) 0.05 % ophthalmic emulsion Place 1 drop into both eyes 2 (two) times daily.   D-Mannose 500 MG CAPS Take 500 mg by mouth daily.   diclofenac Sodium (VOLTAREN) 1 % GEL APPLY 2 TO 4 GRAMS TOPICALLY TO AFFECTED JOINTS UP TO 4 TIMES DAILY   fexofenadine (ALLEGRA) 180 MG tablet Take 1 tablet (180 mg total) by mouth daily.   ipratropium-albuterol (DUONEB) 0.5-2.5 (3) MG/3ML SOLN Take 3 mLs by nebulization as needed.   lansoprazole (PREVACID) 30 MG capsule TAKE 1 CAPSULE BY MOUTH ONCE DAILY AT  NOON (Patient taking differently: Take 30 mg by mouth daily in the afternoon.)   montelukast (SINGULAIR) 10 MG tablet TAKE 1 TABLET BY MOUTH EVERYDAY AT BEDTIME (Patient taking differently: Take 10 mg by mouth at bedtime.)   ondansetron (ZOFRAN) 4 MG tablet Take 4 mg by mouth as needed for nausea or vomiting.   potassium chloride SA (KLOR-CON M) 20 MEQ tablet Take 40 mEq by mouth 2 (two) times daily.   predniSONE (DELTASONE) 5 MG tablet Take 5 mg by mouth daily with breakfast.   saccharomyces boulardii (FLORASTOR) 250 MG capsule Take 250 mg by mouth 2 (two) times daily.   Torsemide 40 MG TABS Take 40 mg by mouth daily.   traMADol (ULTRAM) 50 MG tablet Take 1 tablet (50 mg  total) by mouth every 6 (six) hours as needed (pain).   No facility-administered encounter medications on file as of 03/11/2024.    Review of Systems  Constitutional:  Negative for appetite change, chills, fatigue and fever.  HENT:  Negative for congestion, hearing loss, rhinorrhea and sore throat.   Eyes: Negative.   Respiratory:  Negative for cough, shortness of breath and wheezing.   Cardiovascular:  Negative for chest pain, palpitations and leg swelling.  Gastrointestinal:  Negative for abdominal pain, constipation, diarrhea, nausea and vomiting.  Genitourinary:  Negative for dysuria.  Musculoskeletal:  Negative for arthralgias, back pain and myalgias.  Skin:  Negative for color change, rash and wound.  Neurological:  Negative for dizziness, weakness and headaches.  Psychiatric/Behavioral:  Negative for behavioral problems. The patient is not nervous/anxious.      Immunization History  Administered Date(s) Administered   Fluad Quad(high Dose 65+) 09/30/2020   Influenza, High Dose Seasonal PF 08/16/2018, 09/08/2019, 09/24/2020, 10/04/2021, 09/12/2023   Influenza,inj,Quad PF,6+ Mos 08/11/2016   Influenza-Unspecified 11/12/2017, 10/05/2022   Moderna Covid-19 Vaccine Bivalent Booster 51yrs & up 10/04/2021, 09/26/2023    Moderna Sars-Covid-2 Vaccination 12/15/2019, 01/12/2020, 10/19/2020   PNEUMOCOCCAL CONJUGATE-20 10/04/2021   PPD Test 03/01/2018   Pneumococcal Conjugate-13 06/26/2017   Pneumococcal Polysaccharide-23 12/11/2009   Tdap 04/04/2023   Zoster Recombinant(Shingrix) 01/28/2019, 09/02/2019   Pertinent  Health Maintenance Due  Topic Date Due   INFLUENZA VACCINE  07/11/2024   DEXA SCAN  Completed      01/31/2023    1:08 PM 02/19/2023    2:29 PM 03/06/2023   11:57 AM 03/13/2023   10:48 AM 03/22/2023   10:53 AM  Fall Risk  Falls in the past year? 0 1 0 0 0  Was there an injury with Fall? 0 1 0 0 0  Fall Risk Category Calculator 0 3 0 0 0  Patient at Risk for Falls Due to Impaired balance/gait;Impaired mobility History of fall(s);Impaired balance/gait History of fall(s);Impaired balance/gait History of fall(s);Impaired balance/gait History of fall(s);Impaired balance/gait  Fall risk Follow up Falls evaluation completed Falls evaluation completed Falls evaluation completed Falls evaluation completed Falls evaluation completed     Vitals:   03/11/24 1354  BP: (!) 144/74  Pulse: 74  Resp: 18  Temp: 97.6 F (36.4 C)  SpO2: 95%  Weight: 159 lb 9.6 oz (72.4 kg)  Height: 5\' 5"  (1.651 m)   Body mass index is 26.56 kg/m.  Physical Exam Constitutional:      Appearance: Normal appearance.  HENT:     Head: Normocephalic and atraumatic.     Nose: Nose normal.     Mouth/Throat:     Mouth: Mucous membranes are moist.  Eyes:     Conjunctiva/sclera: Conjunctivae normal.  Cardiovascular:     Rate and Rhythm: Normal rate and regular rhythm.  Pulmonary:     Effort: Pulmonary effort is normal.     Breath sounds: Normal breath sounds.  Abdominal:     General: Bowel sounds are normal.     Palpations: Abdomen is soft.  Musculoskeletal:     Cervical back: Normal range of motion.     Comments: Right hand fingers with arthritic deformities  Skin:    General: Skin is warm and dry.   Neurological:     General: No focal deficit present.     Mental Status: She is alert and oriented to person, place, and time.  Psychiatric:        Mood and Affect: Mood normal.  Behavior: Behavior normal.      Labs reviewed: Recent Labs    12/21/23 1639 12/22/23 0338 12/24/23 0346 12/25/23 0356 12/26/23 0418  NA  --    < > 140 139 140  K  --    < > 3.2* 3.3* 3.2*  CL  --    < > 113* 110 107  CO2  --    < > 23 23 25   GLUCOSE  --    < > 85 83 87  BUN  --    < > 15 11 7*  CREATININE 1.07*   < > 0.84 0.78 0.77  CALCIUM  --    < > 8.8* 8.6* 8.9  MG 1.6*  --  2.3 2.2 2.1  PHOS 3.6  --   --   --   --    < > = values in this interval not displayed.   Recent Labs    12/21/23 0900 12/22/23 0338 12/26/23 0418  AST 50* 45* 52*  ALT 28 26 57*  ALKPHOS 75 57 61  BILITOT 0.8 0.6 0.8  PROT 7.0 5.2* 6.1*  ALBUMIN 3.3* 2.5* 2.8*   Recent Labs    12/21/23 0900 12/21/23 1639 12/22/23 0338 12/24/23 0346 12/26/23 0418  WBC 9.9   < > 7.5 8.1 10.0  NEUTROABS 6.8  --   --  3.5 4.7  HGB 13.7   < > 10.6* 10.3* 11.6*  HCT 44.7   < > 34.1* 32.8* 37.1  MCV 94.1   < > 93.9 92.4 92.8  PLT 226   < > 169 156 188   < > = values in this interval not displayed.   Lab Results  Component Value Date   TSH 0.865 02/27/2022   Lab Results  Component Value Date   HGBA1C 6.5 11/28/2023   Lab Results  Component Value Date   CHOL 151 06/05/2023   HDL 47 06/05/2023   LDLCALC 77 06/05/2023   TRIG 174 (A) 06/05/2023   CHOLHDL 2.6 04/06/2020    Significant Diagnostic Results in last 30 days:  No results found.  Assessment/Plan  1. Slow transit constipation (Primary) -  Abdominal imaging indicated a mild to moderate fecal load, suggesting mild constipation. Reported a bowel movement yesterday and improvement in abdominal pain. No stool softeners were being taken prior to this visit. - Start Colace 100 mg twice a day - docusate sodium (COLACE) 100 MG capsule; Take 1 capsule (100  mg total) by mouth 2 (two) times daily.  2. Other chronic pain -  Experiences chronic pain and is currently taking tramadol 50 mg twice a day and as needed every six hours, along with acetaminophen 650 mg three times a day. Pain management regimen appears to be ongoing and stable.  3. Rheumatoid arthritis involving multiple sites with positive rheumatoid factor (HCC) -  Has arthritis in the right hand, with arthritic deformities. Currently taking prednisone 5 mg daily for arthritis management. - Continue prednisone 5 mg daily  4. Chronic gout without tophus, unspecified cause, unspecified site -  Currently taking allopurinol 100 mg daily. No recent gout flares reported. - Continue allopurinol 100 mg daily  5. Primary hypertension -  Blood pressure is well-controlled at 138/78 mmHg. Currently taking amlodipine 5 mg daily for hypertension management. - Continue amlodipine 5 mg daily    Family/ staff Communication: Discussed plan of care with resident and charge nurse.  Labs/tests ordered:  None    Faraz Ponciano Medina-Vargas, DNP, MSN, FNP-BC [facility] [phone] (Monday-Friday 8:00 a.m. - 5:00 p.m.) [phone] (after hours)

## 2024-03-13 DIAGNOSIS — R29898 Other symptoms and signs involving the musculoskeletal system: Secondary | ICD-10-CM | POA: Diagnosis not present

## 2024-03-13 DIAGNOSIS — M6281 Muscle weakness (generalized): Secondary | ICD-10-CM | POA: Diagnosis not present

## 2024-03-14 DIAGNOSIS — R29898 Other symptoms and signs involving the musculoskeletal system: Secondary | ICD-10-CM | POA: Diagnosis not present

## 2024-03-14 DIAGNOSIS — M6281 Muscle weakness (generalized): Secondary | ICD-10-CM | POA: Diagnosis not present

## 2024-03-18 DIAGNOSIS — M6281 Muscle weakness (generalized): Secondary | ICD-10-CM | POA: Diagnosis not present

## 2024-03-18 DIAGNOSIS — R29898 Other symptoms and signs involving the musculoskeletal system: Secondary | ICD-10-CM | POA: Diagnosis not present

## 2024-03-19 ENCOUNTER — Ambulatory Visit: Payer: Medicare PPO | Admitting: Emergency Medicine

## 2024-03-19 ENCOUNTER — Encounter: Payer: Self-pay | Admitting: Emergency Medicine

## 2024-03-19 VITALS — BP 134/62 | HR 65 | Ht 65.0 in | Wt 162.6 lb

## 2024-03-19 DIAGNOSIS — M6281 Muscle weakness (generalized): Secondary | ICD-10-CM | POA: Diagnosis not present

## 2024-03-19 DIAGNOSIS — R29898 Other symptoms and signs involving the musculoskeletal system: Secondary | ICD-10-CM | POA: Diagnosis not present

## 2024-03-19 DIAGNOSIS — R918 Other nonspecific abnormal finding of lung field: Secondary | ICD-10-CM

## 2024-03-19 NOTE — Assessment & Plan Note (Signed)
 Etiology unclear.  Comparison to prior CT of the abdomen did show 2 small right basilar nodules that were stable.  The others had not been previously imaged.  We discussed the differential diagnosis including infection, rheumatoid, possibly malignancy.  We talked about her philosophy for her care at age 89.  She may decide that she does not want invasive testing.  For now we are going to repeat her CT chest, assess for clinical stability, resolution.  If the nodules show increased likelihood to be malignancy then we will have to talk about goals for care, possible biopsy, invasive testing, etc.  Will plan to repeat her CT scan of the chest now, compare to prior.  She will follow-up with me after scanning so we can review.

## 2024-03-19 NOTE — Progress Notes (Signed)
 Subjective:    Patient ID: Eileen Wilkerson, female    DOB: May 07, 1932, 89 y.o.   MRN: [id number]  HPI 89 year old woman with a history of former tobacco use (8 pack years), rheumatoid arthritis on chronic steroids, GERD, fibromyalgia, hypertension with HFpEF COPD/asthma, adrenal insufficiency.  She was hospitalized in January 2025 with shock and presumed sepsis with associated encephalopathy and acute on chronic (stage IIIa) renal insufficiency.  She had imaging that showed a 2.3 x 2.4 cm left renal cyst, 3.5 x 4.3 cm hypoattenuating left adnexal lesion likely an ovarian cyst.  CT-PA done on 12/21/2023 showed pulmonary nodular disease as below, etiology unclear but question possible malignancy. She reports today that she is feeling better, believes that the hospitalization was due to norovirus. No SOB, no cough. She has a lot of sinus and nasal congestion.   CT-PA 12/21/2023 reviewed by me, shows no evidence of PE, no significant mediastinal or hilar lymphadenopathy, some bronchial wall thickening, likely mucous plugging.  There were multiple noncalcified nodules bilaterally, including a 1.8 x 1.8 cm left upper lobe nodule with some central cavitation.  Unclear etiology.   Review of Systems As per HPI   Past Medical History:  Diagnosis Date   Adrenal failure (HCC)    Arthritis    Asthma    Cancer (HCC)    Cataract    Closed nondisplaced fracture of fifth right metatarsal bone 09/18/2017   Diverticulitis    Per patient   E coli bacteremia    Fibromyalgia 2008   GERD (gastroesophageal reflux disease) 12/18/2016   HCAP (healthcare-associated pneumonia) 02/03/2018   Hyperlipidemia 10/13/2021   Osteoporosis    RA (rheumatoid arthritis) (HCC)    Recurrent upper respiratory infection (URI)    Sepsis due to urinary tract infection (HCC) 01/18/2018   Urticaria      Family History  Problem Relation Age of Onset   Heart attack Maternal Grandmother    Heart attack Paternal Grandfather     Breast cancer Mother 11   Diabetes Father    Heart disease Father    Congestive Heart Failure Father 30       Died from   Allergic rhinitis Neg Hx    Asthma Neg Hx    Eczema Neg Hx    Urticaria Neg Hx      Social History   Socioeconomic History   Marital status: Widowed    Spouse name: Not on file   Number of children: 2   Years of education: Not on file   Highest education level: Not on file  Occupational History   Occupation: Retired Child psychotherapist  Tobacco Use   Smoking status: Former    Current packs/day: 0.00    Average packs/day: 0.8 packs/day for 10.0 years (7.5 ttl pk-yrs)    Types: Cigarettes    Start date: 36    Quit date: 1968    Years since quitting: 57.3    Passive exposure: Never   Smokeless tobacco: Never  Vaping Use   Vaping status: Never Used  Substance and Sexual Activity   Alcohol use: No   Drug use: No   Sexual activity: Not Currently  Other Topics Concern   Not on file  Social History Narrative      Diet:        Do you drink/ eat things with caffeine? Dr. Reino Kent 2/ day      Marital status: Widowed  What year were you married ? 1953      Do you live in a house, apartment,assistred living, condo, trailer, etc.)? Apartment      Is it one or more stories?       How many persons live in your home ? 1      Do you have any pets in your home ?(please list) No      Highest Level of education completed: PhD       Current or past profession: Paramedic, Child psychotherapist, Special Educator       Do you exercise?   No                           Type & how often       ADVANCED DIRECTIVES (Please bring copies)      Do you have a living will? Yes      Do you have a DNR form?                       If not, do you want to discuss one? Yes      Do you have signed POA?HPOA forms?                 If so, please bring to your appointment Tes      FUNCTIONAL STATUS- To be completed by Spouse / child / Staff       Do you  have difficulty bathing or dressing yourself ? No      Do you have difficulty preparing food or eating ? No      Do you have difficulty managing your mediation ? No      Do you have difficulty managing your finances ? No      Do you have difficulty affording your medication ? No      Social Drivers of Corporate investment banker Strain: Not on file  Food Insecurity: No Food Insecurity (12/21/2023)   Hunger Vital Sign    Worried About Running Out of Food in the Last Year: Never true    Ran Out of Food in the Last Year: Never true  Transportation Needs: No Transportation Needs (12/21/2023)   PRAPARE - Administrator, Civil Service (Medical): No    Lack of Transportation (Non-Medical): No  Physical Activity: Not on file  Stress: Not on file  Social Connections: Not on file  Intimate Partner Violence: Not At Risk (12/21/2023)   Humiliation, Afraid, Rape, and Kick questionnaire    Fear of Current or Ex-Partner: No    Emotionally Abused: No    Physically Abused: No    Sexually Abused: No     Allergies  Allergen Reactions   Adhesive [Tape] Rash   Celebrex [Celecoxib] Hives   Ciprofibrate Nausea Only   Cymbalta [Duloxetine Hcl] Swelling   Gabitril [Tiagabine] Swelling   Lyrica [Pregabalin] Swelling   Neurontin [Gabapentin] Swelling   Nexium [Esomeprazole] Rash   Nsaids Rash   Shrimp [Shellfish Allergy] Anaphylaxis    Per patient "shrimp only"   Azactam [Aztreonam]     Hand swelling    Azelastine Hcl     Rash    Ciprofloxacin Other (See Comments)    dizziness   Claritin [Loratadine]     Irritability Nervousness    Methotrexate Derivatives    Nasacort [Triamcinolone]     Dizzy    Olopatadine Other (See Comments)  Pain and lethargy    Other    Sulfamethizole Other (See Comments)    unknown   Zantac [Ranitidine Hcl] Other (See Comments)    unknown   Claritin-D 12 Hour [Loratadine-Pseudoephedrine Er] Anxiety   Keflex [Cephalexin] Nausea And Vomiting     Tolerated Ancef   Penicillins Rash    Injection site reaction. Tolerated cefepime in past. Also reports tolerating a penicillin infusion after this initial rxn ~20 yrs ago.  Has patient had a PCN reaction causing immediate rash, facial/tongue/throat swelling, SOB or lightheadedness with hypotension: No Has patient had a PCN reaction causing severe rash involving mucus membranes or skin necrosis: No Has patient had a PCN reaction that required hospitalization No Has patient had a PCN reaction occurring within the last 10 years: No     Sulfa Antibiotics Nausea And Vomiting     Outpatient Medications Prior to Visit  Medication Sig Dispense Refill   acetaminophen (TYLENOL) 500 MG tablet Take 650 mg by mouth in the morning and at bedtime.     allopurinol (ZYLOPRIM) 100 MG tablet Take 100 mg by mouth daily.     amLODipine (NORVASC) 5 MG tablet Take 5 mg by mouth daily.     Biotin 5 MG TBDP Take 1 tablet (5 mg total) by mouth daily. 30 tablet 0   CRANBERRY PO Take 1 capsule by mouth 2 (two) times daily.     cycloSPORINE (RESTASIS) 0.05 % ophthalmic emulsion Place 1 drop into both eyes 2 (two) times daily. 1.5 mL 1   D-Mannose 500 MG CAPS Take 500 mg by mouth daily.     diclofenac Sodium (VOLTAREN) 1 % GEL APPLY 2 TO 4 GRAMS TOPICALLY TO AFFECTED JOINTS UP TO 4 TIMES DAILY 400 g 0   docusate sodium (COLACE) 100 MG capsule Take 1 capsule (100 mg total) by mouth 2 (two) times daily.     fexofenadine (ALLEGRA) 180 MG tablet Take 1 tablet (180 mg total) by mouth daily. 30 tablet 0   ipratropium-albuterol (DUONEB) 0.5-2.5 (3) MG/3ML SOLN Take 3 mLs by nebulization as needed.     lansoprazole (PREVACID) 30 MG capsule TAKE 1 CAPSULE BY MOUTH ONCE DAILY AT NOON (Patient taking differently: Take 30 mg by mouth daily in the afternoon.) 90 capsule 3   montelukast (SINGULAIR) 10 MG tablet TAKE 1 TABLET BY MOUTH EVERYDAY AT BEDTIME (Patient taking differently: Take 10 mg by mouth at bedtime.) 30 tablet 11    ondansetron (ZOFRAN) 4 MG tablet Take 4 mg by mouth as needed for nausea or vomiting.     potassium chloride SA (KLOR-CON M) 20 MEQ tablet Take 40 mEq by mouth 2 (two) times daily.     predniSONE (DELTASONE) 5 MG tablet Take 5 mg by mouth daily with breakfast.     saccharomyces boulardii (FLORASTOR) 250 MG capsule Take 250 mg by mouth 2 (two) times daily.     Torsemide 40 MG TABS Take 40 mg by mouth daily.     traMADol (ULTRAM) 50 MG tablet Take 1 tablet (50 mg total) by mouth every 6 (six) hours as needed (pain). 10 tablet 0   atorvastatin (LIPITOR) 20 MG tablet TAKE 1 TABLET BY MOUTH EVERY DAY 90 tablet 1   Cholecalciferol (VITAMIN D3) 125 MCG (5000 UT) CAPS TAKE 1 CAPSULE BY MOUTH EVERY DAY 90 capsule 1   colchicine 0.6 MG tablet TAKE 1 TABLET BY MOUTH EVERY DAY 30 tablet 11   No facility-administered medications prior to visit.  Objective:   Physical Exam Vitals:   03/19/24 1528  BP: 134/62  Pulse: 65  SpO2: 99%  Weight: 162 lb 9.6 oz (73.8 kg)  Height: 5\' 5"  (1.651 m)    Gen: Pleasant, elderly woman in a wheelchair, in no distress,  normal affect  ENT: No lesions,  mouth clear,  oropharynx clear, no postnasal drip  Neck: No JVD, no stridor  Lungs: No use of accessory muscles, no crackles or wheezing on normal respiration, no wheeze on forced expiration  Cardiovascular: RRR, heart sounds normal, no murmur or gallops, trace peripheral edema  Musculoskeletal: No deformities, no cyanosis or clubbing  Neuro: alert, awake, non focal  Skin: Warm, no lesions or rash      Assessment & Plan:   Pulmonary nodules Etiology unclear.  Comparison to prior CT of the abdomen did show 2 small right basilar nodules that were stable.  The others had not been previously imaged.  We discussed the differential diagnosis including infection, rheumatoid, possibly malignancy.  We talked about her philosophy for her care at age 40.  She may decide that she does not want invasive  testing.  For now we are going to repeat her CT chest, assess for clinical stability, resolution.  If the nodules show increased likelihood to be malignancy then we will have to talk about goals for care, possible biopsy, invasive testing, etc.  Will plan to repeat her CT scan of the chest now, compare to prior.  She will follow-up with me after scanning so we can review.   Levy Pupa, MD, PhD 03/19/2024, 4:27 PM [facility] [phone] or if no answer before 7:00PM call [phone] For any issues after 7:00PM please call eLink [phone]

## 2024-03-19 NOTE — Patient Instructions (Signed)
 We reviewed your CT scan of the chest today. We will plan to repeat your CT chest now to follow pulmonary nodules. Please follow with Dr. Delton Coombes next available after your CT so we can review those results together.

## 2024-03-21 DIAGNOSIS — M6281 Muscle weakness (generalized): Secondary | ICD-10-CM | POA: Diagnosis not present

## 2024-03-21 DIAGNOSIS — R29898 Other symptoms and signs involving the musculoskeletal system: Secondary | ICD-10-CM | POA: Diagnosis not present

## 2024-03-25 DIAGNOSIS — R29898 Other symptoms and signs involving the musculoskeletal system: Secondary | ICD-10-CM | POA: Diagnosis not present

## 2024-03-25 DIAGNOSIS — M6281 Muscle weakness (generalized): Secondary | ICD-10-CM | POA: Diagnosis not present

## 2024-03-26 DIAGNOSIS — M6281 Muscle weakness (generalized): Secondary | ICD-10-CM | POA: Diagnosis not present

## 2024-03-26 DIAGNOSIS — R29898 Other symptoms and signs involving the musculoskeletal system: Secondary | ICD-10-CM | POA: Diagnosis not present

## 2024-03-28 DIAGNOSIS — R29898 Other symptoms and signs involving the musculoskeletal system: Secondary | ICD-10-CM | POA: Diagnosis not present

## 2024-03-28 DIAGNOSIS — M6281 Muscle weakness (generalized): Secondary | ICD-10-CM | POA: Diagnosis not present

## 2024-04-01 DIAGNOSIS — R29898 Other symptoms and signs involving the musculoskeletal system: Secondary | ICD-10-CM | POA: Diagnosis not present

## 2024-04-01 DIAGNOSIS — M6281 Muscle weakness (generalized): Secondary | ICD-10-CM | POA: Diagnosis not present

## 2024-04-03 DIAGNOSIS — M6281 Muscle weakness (generalized): Secondary | ICD-10-CM | POA: Diagnosis not present

## 2024-04-03 DIAGNOSIS — R29898 Other symptoms and signs involving the musculoskeletal system: Secondary | ICD-10-CM | POA: Diagnosis not present

## 2024-04-07 DIAGNOSIS — R29898 Other symptoms and signs involving the musculoskeletal system: Secondary | ICD-10-CM | POA: Diagnosis not present

## 2024-04-07 DIAGNOSIS — M6281 Muscle weakness (generalized): Secondary | ICD-10-CM | POA: Diagnosis not present

## 2024-04-10 DIAGNOSIS — R29898 Other symptoms and signs involving the musculoskeletal system: Secondary | ICD-10-CM | POA: Diagnosis not present

## 2024-04-10 DIAGNOSIS — M6281 Muscle weakness (generalized): Secondary | ICD-10-CM | POA: Diagnosis not present

## 2024-04-15 DIAGNOSIS — R918 Other nonspecific abnormal finding of lung field: Secondary | ICD-10-CM | POA: Diagnosis not present

## 2024-04-15 DIAGNOSIS — I5032 Chronic diastolic (congestive) heart failure: Secondary | ICD-10-CM | POA: Diagnosis not present

## 2024-04-15 DIAGNOSIS — Z515 Encounter for palliative care: Secondary | ICD-10-CM | POA: Diagnosis not present

## 2024-04-18 ENCOUNTER — Other Ambulatory Visit

## 2024-04-21 ENCOUNTER — Other Ambulatory Visit

## 2024-04-24 ENCOUNTER — Non-Acute Institutional Stay (SKILLED_NURSING_FACILITY): Payer: Self-pay | Admitting: Sports Medicine

## 2024-04-24 DIAGNOSIS — M797 Fibromyalgia: Secondary | ICD-10-CM

## 2024-04-24 DIAGNOSIS — K219 Gastro-esophageal reflux disease without esophagitis: Secondary | ICD-10-CM | POA: Diagnosis not present

## 2024-04-24 DIAGNOSIS — J45909 Unspecified asthma, uncomplicated: Secondary | ICD-10-CM | POA: Diagnosis not present

## 2024-04-24 DIAGNOSIS — K5900 Constipation, unspecified: Secondary | ICD-10-CM | POA: Diagnosis not present

## 2024-04-24 DIAGNOSIS — I5032 Chronic diastolic (congestive) heart failure: Secondary | ICD-10-CM

## 2024-04-24 DIAGNOSIS — E876 Hypokalemia: Secondary | ICD-10-CM

## 2024-04-24 DIAGNOSIS — R918 Other nonspecific abnormal finding of lung field: Secondary | ICD-10-CM

## 2024-04-24 NOTE — Progress Notes (Unsigned)
 Provider:  Dr. Tye Gall Location:  [facility] Place of Service:   Assisted living   PCP: Mast, [name], NP [name], [name], [name]  Extended Emergency Contact Information Primary Emergency Contact: Debrew,Jacqueline  [place] Home Phone: [phone] Mobile Phone: [phone] Relation: Daughter  Goals of Care: Advanced Directive information    12/21/2023    8:00 PM  Advanced Directives  Does Patient Have a Medical Advance Directive? Yes  Type of Estate agent of [place];Living will  Does patient want to make changes to medical advance directive? No - Patient declined  Copy of Healthcare Power of Attorney in Chart? Yes - validated most recent copy scanned in chart (See row information)      No chief complaint on file.     History of Present Illness 89 yr old F with h/o gout, HTN, pulmonary nodules, OA is evaluated for chronic disease management  Pt seen and examined in her room  She is laying on her bed, seems pleasant and comfortable and does not appear to be in distress.  Pt states she stays up late in the morning and skips breakfast. Ambulates with a walker, no recent falls  H/o pulmonary nodules Saw pulmonology  Recommended repeat CT scan  Denies fevers, chills, cough, SOB      Past Medical History:  Diagnosis Date   Adrenal failure (HCC)    Arthritis    Asthma    Cancer (HCC)    Cataract    Closed nondisplaced fracture of fifth right metatarsal bone 09/18/2017   Diverticulitis    Per patient   E coli bacteremia    Fibromyalgia 2008   GERD (gastroesophageal reflux disease) 12/18/2016   HCAP (healthcare-associated pneumonia) 02/03/2018   Hyperlipidemia 10/13/2021   Osteoporosis    RA (rheumatoid arthritis) (HCC)    Recurrent upper respiratory infection (URI)    Sepsis due to urinary tract infection (HCC) 01/18/2018   Urticaria    Past  Surgical History:  Procedure Laterality Date   BACK SURGERY     BILATERAL CARPAL TUNNEL RELEASE  2005   right and left   CATARACT EXTRACTION, BILATERAL  2004   right and left   CERVICAL FUSION  2011,2010,2008   2 disks   HEEL SPUR SURGERY  2004   lower back fusion  2011   Fusion of 3-4 and 4-5 lower back   RADIOFREQUENCY ABLATION  2020   ROTATOR CUFF REPAIR  [id number]   SQUAMOUS CELL CARCINOMA EXCISION     TONSILLECTOMY AND ADENOIDECTOMY  1947   TOTAL SHOULDER ARTHROPLASTY      reports that she quit smoking about 57 years ago. Her smoking use included cigarettes. She started smoking about 67 years ago. She has a 7.5 pack-year smoking history. She has never been exposed to tobacco smoke. She has never used smokeless tobacco. She reports that she does not drink alcohol and does not use drugs. Social History   Socioeconomic History   Marital status: Widowed    Spouse name: Not on file   Number of children: 2   Years of education: Not on file   Highest education level: Not on file  Occupational History   Occupation: Retired Child psychotherapist  Tobacco Use   Smoking status: Former    Current packs/day: 0.00    Average packs/day: 0.8 packs/day for 10.0 years (7.5 ttl pk-yrs)    Types: Cigarettes    Start date: 1958  Quit date: 22    Years since quitting: 57.4    Passive exposure: Never   Smokeless tobacco: Never  Vaping Use   Vaping status: Never Used  Substance and Sexual Activity   Alcohol use: No   Drug use: No   Sexual activity: Not Currently  Other Topics Concern   Not on file  Social History Narrative      Diet:        Do you drink/ eat things with caffeine? Dr. Kathlene Paradise 2/ day      Marital status: Widowed                              What year were you married ? 1953      Do you live in a house, apartment,assistred living, condo, trailer, etc.)? Apartment      Is it one or more stories?       How many persons live in your home ? 1      Do you have any pets  in your home ?(please list) No      Highest Level of education completed: PhD       Current or past profession: Paramedic, Child psychotherapist, Special Educator       Do you exercise?   No                           Type & how often       ADVANCED DIRECTIVES (Please bring copies)      Do you have a living will? Yes      Do you have a DNR form?                       If not, do you want to discuss one? Yes      Do you have signed POA?HPOA forms?                 If so, please bring to your appointment Tes      FUNCTIONAL STATUS- To be completed by Spouse / child / Staff       Do you have difficulty bathing or dressing yourself ? No      Do you have difficulty preparing food or eating ? No      Do you have difficulty managing your mediation ? No      Do you have difficulty managing your finances ? No      Do you have difficulty affording your medication ? No      Social Drivers of Corporate investment banker Strain: Not on file  Food Insecurity: No Food Insecurity (12/21/2023)   Hunger Vital Sign    Worried About Running Out of Food in the Last Year: Never true    Ran Out of Food in the Last Year: Never true  Transportation Needs: No Transportation Needs (12/21/2023)   PRAPARE - Administrator, Civil Service (Medical): No    Lack of Transportation (Non-Medical): No  Physical Activity: Not on file  Stress: Not on file  Social Connections: Not on file  Intimate Partner Violence: Not At Risk (12/21/2023)   Humiliation, Afraid, Rape, and Kick questionnaire    Fear of Current or Ex-Partner: No    Emotionally Abused: No    Physically Abused: No    Sexually Abused: No    Functional Status Survey:  Family History  Problem Relation Age of Onset   Heart attack Maternal Grandmother    Heart attack Paternal Grandfather    Breast cancer Mother 103   Diabetes Father    Heart disease Father    Congestive Heart Failure Father 18       Died from   Allergic rhinitis Neg Hx     Asthma Neg Hx    Eczema Neg Hx    Urticaria Neg Hx     Health Maintenance  Topic Date Due   COVID-19 Vaccine (6 - 2024-25 season) 11/21/2023   INFLUENZA VACCINE  07/11/2024   DTaP/Tdap/Td (2 - Td or Tdap) 04/03/2033   Pneumonia Vaccine 20+ Years old  Completed   DEXA SCAN  Completed   Zoster Vaccines- Shingrix  Completed   HPV VACCINES  Aged Out   Meningococcal B Vaccine  Aged Out    Allergies  Allergen Reactions   Adhesive [Tape] Rash   Celebrex [Celecoxib] Hives   Ciprofibrate Nausea Only   Cymbalta [Duloxetine Hcl] Swelling   Gabitril [Tiagabine] Swelling   Lyrica [Pregabalin] Swelling   Neurontin [Gabapentin] Swelling   Nexium  [Esomeprazole ] Rash   Nsaids Rash   Shrimp [Shellfish Allergy] Anaphylaxis    Per patient "shrimp only"   Azactam  [Aztreonam ]     Hand swelling    Azelastine  Hcl     Rash    Ciprofloxacin  Other (See Comments)    dizziness   Claritin  [Loratadine ]     Irritability Nervousness    Methotrexate  Derivatives    Nasacort  [Triamcinolone ]     Dizzy    Olopatadine  Other (See Comments)    Pain and lethargy    Other    Sulfamethizole Other (See Comments)    unknown   Zantac [Ranitidine Hcl] Other (See Comments)    unknown   Claritin -D 12 Hour [Loratadine -Pseudoephedrine Er] Anxiety   Keflex  [Cephalexin ] Nausea And Vomiting    Tolerated Ancef    Penicillins Rash    Injection site reaction. Tolerated cefepime  in past. Also reports tolerating a penicillin infusion after this initial rxn ~20 yrs ago.  Has patient had a PCN reaction causing immediate rash, facial/tongue/throat swelling, SOB or lightheadedness with hypotension: No Has patient had a PCN reaction causing severe rash involving mucus membranes or skin necrosis: No Has patient had a PCN reaction that required hospitalization No Has patient had a PCN reaction occurring within the last 10 years: No     Sulfa Antibiotics Nausea And Vomiting    Outpatient Encounter Medications as of  04/24/2024  Medication Sig   acetaminophen  (TYLENOL ) 500 MG tablet Take 650 mg by mouth in the morning and at bedtime.   allopurinol (ZYLOPRIM) 100 MG tablet Take 100 mg by mouth daily.   amLODipine  (NORVASC ) 5 MG tablet Take 5 mg by mouth daily.   atorvastatin  (LIPITOR) 20 MG tablet TAKE 1 TABLET BY MOUTH EVERY DAY   Biotin  5 MG TBDP Take 1 tablet (5 mg total) by mouth daily.   Cholecalciferol  (VITAMIN D3) 125 MCG (5000 UT) CAPS TAKE 1 CAPSULE BY MOUTH EVERY DAY   colchicine  0.6 MG tablet TAKE 1 TABLET BY MOUTH EVERY DAY   CRANBERRY PO Take 1 capsule by mouth 2 (two) times daily.   cycloSPORINE  (RESTASIS ) 0.05 % ophthalmic emulsion Place 1 drop into both eyes 2 (two) times daily.   D-Mannose 500 MG CAPS Take 500 mg by mouth daily.   diclofenac  Sodium (VOLTAREN ) 1 % GEL APPLY 2 TO 4 GRAMS TOPICALLY TO AFFECTED JOINTS  UP TO 4 TIMES DAILY   docusate sodium  (COLACE) 100 MG capsule Take 1 capsule (100 mg total) by mouth 2 (two) times daily.   fexofenadine  (ALLEGRA ) 180 MG tablet Take 1 tablet (180 mg total) by mouth daily.   ipratropium-albuterol  (DUONEB) 0.5-2.5 (3) MG/3ML SOLN Take 3 mLs by nebulization as needed.   lansoprazole  (PREVACID ) 30 MG capsule TAKE 1 CAPSULE BY MOUTH ONCE DAILY AT NOON (Patient taking differently: Take 30 mg by mouth daily in the afternoon.)   montelukast  (SINGULAIR ) 10 MG tablet TAKE 1 TABLET BY MOUTH EVERYDAY AT BEDTIME (Patient taking differently: Take 10 mg by mouth at bedtime.)   ondansetron  (ZOFRAN ) 4 MG tablet Take 4 mg by mouth as needed for nausea or vomiting.   potassium chloride  SA (KLOR-CON  M) 20 MEQ tablet Take 40 mEq by mouth 2 (two) times daily.   predniSONE  (DELTASONE ) 5 MG tablet Take 5 mg by mouth daily with breakfast.   saccharomyces boulardii (FLORASTOR) 250 MG capsule Take 250 mg by mouth 2 (two) times daily.   Torsemide 40 MG TABS Take 40 mg by mouth daily.   traMADol  (ULTRAM ) 50 MG tablet Take 1 tablet (50 mg total) by mouth every 6 (six) hours as  needed (pain).   No facility-administered encounter medications on file as of 04/24/2024.    Review of Systems  Constitutional:  Negative for fever.  HENT:  Negative for sinus pressure and sore throat.   Respiratory:  Negative for cough, shortness of breath and wheezing.   Cardiovascular:  Negative for chest pain, palpitations and leg swelling.  Gastrointestinal:  Negative for abdominal distention, abdominal pain, blood in stool, constipation, diarrhea, nausea and vomiting.  Genitourinary:  Negative for dysuria, frequency and urgency.  Neurological:  Negative for dizziness.  Psychiatric/Behavioral:  Negative for confusion.    Negative unless indicated in HPI.  There were no vitals filed for this visit. There is no height or weight on file to calculate BMI. BP Readings from Last 3 Encounters:  03/19/24 134/62  03/11/24 138/78  03/10/24 (!) 157/91   Wt Readings from Last 3 Encounters:  03/19/24 162 lb 9.6 oz (73.8 kg)  03/11/24 159 lb 9.6 oz (72.4 kg)  03/10/24 159 lb 9.6 oz (72.4 kg)   Physical Exam Constitutional:      Appearance: Normal appearance.  HENT:     Head: Normocephalic and atraumatic.  Cardiovascular:     Rate and Rhythm: Normal rate and regular rhythm.  Pulmonary:     Effort: Pulmonary effort is normal. No respiratory distress.     Breath sounds: Normal breath sounds. No wheezing.  Abdominal:     General: Bowel sounds are normal. There is no distension.     Tenderness: There is no abdominal tenderness. There is no guarding.     Comments:    Musculoskeletal:        General: No swelling.  Neurological:     Mental Status: She is alert. Mental status is at baseline.     Motor: No weakness.     Labs reviewed: Basic Metabolic Panel: Recent Labs    12/21/23 1639 12/22/23 0338 12/24/23 0346 12/25/23 0356 12/26/23 0418  NA  --    < > 140 139 140  K  --    < > 3.2* 3.3* 3.2*  CL  --    < > 113* 110 107  CO2  --    < > 23 23 25   GLUCOSE  --    < > 85 83  87  BUN  --    < > 15 11 7*  CREATININE 1.07*   < > 0.84 0.78 0.77  CALCIUM   --    < > 8.8* 8.6* 8.9  MG 1.6*  --  2.3 2.2 2.1  PHOS 3.6  --   --   --   --    < > = values in this interval not displayed.   Liver Function Tests: Recent Labs    12/21/23 0900 12/22/23 0338 12/26/23 0418  AST 50* 45* 52*  ALT 28 26 57*  ALKPHOS 75 57 61  BILITOT 0.8 0.6 0.8  PROT 7.0 5.2* 6.1*  ALBUMIN  3.3* 2.5* 2.8*   Recent Labs    12/21/23 1639  LIPASE 20   Recent Labs    12/21/23 1130  AMMONIA 23   CBC: Recent Labs    12/21/23 0900 12/21/23 1639 12/22/23 0338 12/24/23 0346 12/26/23 0418  WBC 9.9   < > 7.5 8.1 10.0  NEUTROABS 6.8  --   --  3.5 4.7  HGB 13.7   < > 10.6* 10.3* 11.6*  HCT 44.7   < > 34.1* 32.8* 37.1  MCV 94.1   < > 93.9 92.4 92.8  PLT 226   < > 169 156 188   < > = values in this interval not displayed.   Cardiac Enzymes: No results for input(s): "CKTOTAL", "CKMB", "CKMBINDEX", "TROPONINI" in the last 8760 hours. BNP: Invalid input(s): "POCBNP" Lab Results  Component Value Date   HGBA1C 6.5 11/28/2023   Lab Results  Component Value Date   TSH 0.865 02/27/2022   Lab Results  Component Value Date   VITAMINB12 1,377 (H) 05/26/2022   Lab Results  Component Value Date   FOLATE 26.8 02/06/2018   Lab Results  Component Value Date   IRON 89 02/06/2018   TIBC 281 02/06/2018   FERRITIN 74 02/06/2018    Imaging and Procedures obtained prior to SNF admission: ECHOCARDIOGRAM COMPLETE Result Date: 12/21/2023    ECHOCARDIOGRAM REPORT   Patient Name:   AZAYA MCCOSKEY Date of Exam: 12/21/2023 Medical Rec #:  [id number]          Height:       64.0 in Accession #:    [id number]         Weight:       162.0 lb Date of Birth:  11-10-1932          BSA:          1.789 m Patient Age:    89 years           BP:           97/58 mmHg Patient Gender: F                  HR:           76 bpm. Exam Location:  Inpatient Procedure: 2D Echo, Cardiac Doppler and Color Doppler STAT  ECHO Indications:    Shock  History:        Patient has prior history of Echocardiogram examinations, most                 recent 05/22/2022. CHF, Arrythmias:Tachycardia,                 Signs/Symptoms:Shortness of Breath; Risk Factors:Dyslipidemia                 and Hypertension.  Sonographer:    Aldon Ambrosia Referring Phys: [phone]  JONATHAN B DEWALD IMPRESSIONS  1. Left ventricular ejection fraction, by estimation, is 55 to 60%. The left ventricle has normal function. The left ventricle has no regional wall motion abnormalities. Left ventricular diastolic parameters are consistent with Grade I diastolic dysfunction (impaired relaxation).  2. Right ventricular systolic function is normal. The right ventricular size is normal. Tricuspid regurgitation signal is inadequate for assessing PA pressure.  3. The mitral valve is normal in structure. No evidence of mitral valve regurgitation. No evidence of mitral stenosis.  4. The aortic valve is tricuspid. There is mild calcification of the aortic valve. Aortic valve regurgitation is not visualized. No aortic stenosis is present.  5. The inferior vena cava is normal in size with greater than 50% respiratory variability, suggesting right atrial pressure of 3 mmHg. FINDINGS  Left Ventricle: Left ventricular ejection fraction, by estimation, is 55 to 60%. The left ventricle has normal function. The left ventricle has no regional wall motion abnormalities. The left ventricular internal cavity size was normal in size. There is  no left ventricular hypertrophy. Left ventricular diastolic parameters are consistent with Grade I diastolic dysfunction (impaired relaxation). Right Ventricle: The right ventricular size is normal. No increase in right ventricular wall thickness. Right ventricular systolic function is normal. Tricuspid regurgitation signal is inadequate for assessing PA pressure. Left Atrium: Left atrial size was normal in size. Right Atrium: Right atrial size was  normal in size. Pericardium: There is no evidence of pericardial effusion. Mitral Valve: The mitral valve is normal in structure. No evidence of mitral valve regurgitation. No evidence of mitral valve stenosis. MV peak gradient, 2.5 mmHg. The mean mitral valve gradient is 1.0 mmHg. Tricuspid Valve: The tricuspid valve is normal in structure. Tricuspid valve regurgitation is not demonstrated. Aortic Valve: The aortic valve is tricuspid. There is mild calcification of the aortic valve. Aortic valve regurgitation is not visualized. No aortic stenosis is present. Aortic valve mean gradient measures 6.0 mmHg. Aortic valve peak gradient measures 11.4 mmHg. Aortic valve area, by VTI measures 2.06 cm. Pulmonic Valve: The pulmonic valve was normal in structure. Pulmonic valve regurgitation is not visualized. Aorta: The aortic root is normal in size and structure. Venous: The inferior vena cava is normal in size with greater than 50% respiratory variability, suggesting right atrial pressure of 3 mmHg. IAS/Shunts: No atrial level shunt detected by color flow Doppler.  LEFT VENTRICLE PLAX 2D LVIDd:         3.90 cm      Diastology LVIDs:         3.00 cm      LV e' medial:    6.85 cm/s LV PW:         0.70 cm      LV E/e' medial:  9.8 LV IVS:        0.80 cm      LV e' lateral:   8.05 cm/s LVOT diam:     1.80 cm      LV E/e' lateral: 8.3 LV SV:         64 LV SV Index:   36 LVOT Area:     2.54 cm  LV Volumes (MOD) LV vol d, MOD A2C: 104.0 ml LV vol d, MOD A4C: 112.0 ml LV vol s, MOD A2C: 34.3 ml LV vol s, MOD A4C: 50.3 ml LV SV MOD A2C:     69.7 ml LV SV MOD A4C:     112.0 ml LV SV MOD BP:  67.4 ml RIGHT VENTRICLE             IVC RV Basal diam:  3.80 cm     IVC diam: 1.70 cm RV Mid diam:    3.70 cm RV S prime:     13.50 cm/s TAPSE (M-mode): 2.6 cm LEFT ATRIUM             Index        RIGHT ATRIUM          Index LA diam:        3.10 cm 1.73 cm/m   RA Area:     9.11 cm LA Vol (A2C):   22.4 ml 12.52 ml/m  RA Volume:   17.40 ml  9.73 ml/m LA Vol (A4C):   25.1 ml 14.03 ml/m LA Biplane Vol: 25.9 ml 14.48 ml/m  AORTIC VALVE                     PULMONIC VALVE AV Area (Vmax):    2.02 cm      PV Vmax:       0.78 m/s AV Area (Vmean):   1.51 cm      PV Peak grad:  2.4 mmHg AV Area (VTI):     2.06 cm AV Vmax:           169.00 cm/s AV Vmean:          119.000 cm/s AV VTI:            0.309 m AV Peak Grad:      11.4 mmHg AV Mean Grad:      6.0 mmHg LVOT Vmax:         134.00 cm/s LVOT Vmean:        70.800 cm/s LVOT VTI:          0.250 m LVOT/AV VTI ratio: 0.81  AORTA Ao Root diam: 2.80 cm Ao Asc diam:  2.90 cm MITRAL VALVE MV Area (PHT): 3.12 cm    SHUNTS MV Area VTI:   2.37 cm    Systemic VTI:  0.25 m MV Peak grad:  2.5 mmHg    Systemic Diam: 1.80 cm MV Mean grad:  1.0 mmHg MV Vmax:       0.79 m/s MV Vmean:      51.8 cm/s MV Decel Time: 243 msec MV E velocity: 67.00 cm/s MV A velocity: 62.90 cm/s MV E/A ratio:  1.07 Dalton McleanMD Electronically signed by Archer Bear Signature Date/Time: 12/21/2023/3:10:49 PM    Final    DG Hip Unilat W or Wo Pelvis 2-3 Views Left Result Date: 12/21/2023 CLINICAL DATA:  Left hip pain. EXAM: DG HIP (WITH OR WITHOUT PELVIS) 2-3V LEFT COMPARISON:  None Available. FINDINGS: There is no acute fracture or dislocation.Evaluation for fracture is limited due to osteopenia and body habitus. There is moderate left and severe right hip osteoarthritic changes. There is near complete loss of right hip joint space with near bone on bone contact. Excreted contrast noted in the urinary bladder. The soft tissues are unremarkable. IMPRESSION: 1. No acute fracture or dislocation. 2. Moderate left and severe right hip osteoarthritis. Electronically Signed   By: Angus Bark M.D.   On: 12/21/2023 12:38   CT Angio Chest PE W and/or Wo Contrast Result Date: 12/21/2023 CLINICAL DATA:  Pulmonary embolism (PE) suspected, high prob; Epigastric pain sepsis. Nausea and lethargy. EXAM: CT ANGIOGRAPHY CHEST CT ABDOMEN AND PELVIS  WITH CONTRAST TECHNIQUE: Multidetector CT imaging of the chest was  performed using the standard protocol during bolus administration of intravenous contrast. Multiplanar CT image reconstructions and MIPs were obtained to evaluate the vascular anatomy. Multidetector CT imaging of the abdomen and pelvis was performed using the standard protocol during bolus administration of intravenous contrast. RADIATION DOSE REDUCTION: This exam was performed according to the departmental dose-optimization program which includes automated exposure control, adjustment of the mA and/or kV according to patient size and/or use of iterative reconstruction technique. CONTRAST:  75mL OMNIPAQUE  IOHEXOL  350 MG/ML SOLN COMPARISON:  CT scan abdomen and pelvis from 02/12/2023. FINDINGS: CTA CHEST FINDINGS Cardiovascular: No evidence of embolism to the proximal subsegmental pulmonary artery level. Normal cardiac size. No pericardial effusion. No aortic aneurysm. There are coronary artery calcifications, in keeping with coronary artery disease. Mediastinum/Nodes: Visualized thyroid  gland appears grossly unremarkable. No solid / cystic mediastinal masses. The esophagus is nondistended precluding optimal assessment. There are few mildly prominent mediastinal and hilar lymph nodes, which do not meet the size criteria for lymphadenopathy. No axillary lymphadenopathy by size criteria. Lungs/Pleura: The central tracheo-bronchial tree is patent. There is mild, smooth, circumferential thickening of the segmental and subsegmental bronchial walls, throughout bilateral lungs, which is nonspecific. Findings are most commonly seen with bronchitis or reactive airway disease, such as asthma. There are occlusive and nonocclusive filling defects in the distal segmental and subsegmental bronchi, throughout bilateral lungs, likely due to mucus/secretions or aspiration. There are multiple solid noncalcified nodules throughout bilateral lungs with largest in the  anterior left upper lobe measuring up to 1.8 x 1.8 cm. This nodule exhibits small central cavitation. Rest of the nodules are smaller in size and do not exhibit cavitations. These are compatible with metastases. No consolidation, pleural effusion or pneumothorax. There are dependent changes in bilateral lungs. Musculoskeletal: The visualized soft tissues of the chest wall are grossly unremarkable. No suspicious osseous lesions. There are mild multilevel degenerative changes in the visualized spine. Review of the MIP images confirms the above findings. CT ABDOMEN and PELVIS FINDINGS Hepatobiliary: The liver is normal in size. Non-cirrhotic configuration. No suspicious mass. No intrahepatic or extrahepatic bile duct dilation. No calcified gallstones. Normal gallbladder wall thickness. No pericholecystic inflammatory changes. Pancreas: Unremarkable. No pancreatic ductal dilatation or surrounding inflammatory changes. Spleen: Within normal limits. No focal lesion. Adrenals/Urinary Tract: Adrenal glands are unremarkable. No suspicious renal mass. There is a 2.3 x 2.4 cm simple cyst in the left kidney interpolar region, anteriorly. There are additional several smaller cysts throughout bilateral kidneys. No hydronephrosis. No renal or ureteric calculi. Unremarkable urinary bladder. Stomach/Bowel: There is a small sliding hiatal hernia. No disproportionate dilation of the small or large bowel loops. No evidence of abnormal bowel wall thickening or inflammatory changes. The appendix is unremarkable. There are scattered diverticula mainly in the sigmoid colon, without imaging signs of diverticulitis. Vascular/Lymphatic: No ascites or pneumoperitoneum. No abdominal or pelvic lymphadenopathy, by size criteria. No aneurysmal dilation of the major abdominal arteries. There are mild peripheral atherosclerotic vascular calcifications of the aorta and its major branches. Reproductive: The uterus is unremarkable. There is a 3.5 x 4.3  cm hypoattenuating structure in the left adnexa, most likely ovarian in etiology. The structure exhibits fluid attenuation and has no discrete mural nodule. Even though this is incompletely characterized on the current exam, favored to represent a senescent ovarian cyst. Annual follow-up ultrasound evaluation is recommended to documenting stability. Other: There is a tiny fat containing umbilical hernia. The soft tissues and abdominal wall are otherwise unremarkable. Musculoskeletal: No suspicious osseous lesions. There are  moderate multilevel degenerative changes in the visualized spine. Review of the MIP images confirms the above findings. IMPRESSION: 1. No embolism to the proximal subsegmental pulmonary artery level. 2. There are multiple solid noncalcified lung nodules, compatible with metastases. 3. No acute inflammatory process identified within the abdomen or pelvis. 4. Multiple other nonacute observations, as described above. Electronically Signed   By: Beula Brunswick M.D.   On: 12/21/2023 11:18   CT ABDOMEN PELVIS W CONTRAST Result Date: 12/21/2023 CLINICAL DATA:  Pulmonary embolism (PE) suspected, high prob; Epigastric pain sepsis. Nausea and lethargy. EXAM: CT ANGIOGRAPHY CHEST CT ABDOMEN AND PELVIS WITH CONTRAST TECHNIQUE: Multidetector CT imaging of the chest was performed using the standard protocol during bolus administration of intravenous contrast. Multiplanar CT image reconstructions and MIPs were obtained to evaluate the vascular anatomy. Multidetector CT imaging of the abdomen and pelvis was performed using the standard protocol during bolus administration of intravenous contrast. RADIATION DOSE REDUCTION: This exam was performed according to the departmental dose-optimization program which includes automated exposure control, adjustment of the mA and/or kV according to patient size and/or use of iterative reconstruction technique. CONTRAST:  75mL OMNIPAQUE  IOHEXOL  350 MG/ML SOLN COMPARISON:   CT scan abdomen and pelvis from 02/12/2023. FINDINGS: CTA CHEST FINDINGS Cardiovascular: No evidence of embolism to the proximal subsegmental pulmonary artery level. Normal cardiac size. No pericardial effusion. No aortic aneurysm. There are coronary artery calcifications, in keeping with coronary artery disease. Mediastinum/Nodes: Visualized thyroid  gland appears grossly unremarkable. No solid / cystic mediastinal masses. The esophagus is nondistended precluding optimal assessment. There are few mildly prominent mediastinal and hilar lymph nodes, which do not meet the size criteria for lymphadenopathy. No axillary lymphadenopathy by size criteria. Lungs/Pleura: The central tracheo-bronchial tree is patent. There is mild, smooth, circumferential thickening of the segmental and subsegmental bronchial walls, throughout bilateral lungs, which is nonspecific. Findings are most commonly seen with bronchitis or reactive airway disease, such as asthma. There are occlusive and nonocclusive filling defects in the distal segmental and subsegmental bronchi, throughout bilateral lungs, likely due to mucus/secretions or aspiration. There are multiple solid noncalcified nodules throughout bilateral lungs with largest in the anterior left upper lobe measuring up to 1.8 x 1.8 cm. This nodule exhibits small central cavitation. Rest of the nodules are smaller in size and do not exhibit cavitations. These are compatible with metastases. No consolidation, pleural effusion or pneumothorax. There are dependent changes in bilateral lungs. Musculoskeletal: The visualized soft tissues of the chest wall are grossly unremarkable. No suspicious osseous lesions. There are mild multilevel degenerative changes in the visualized spine. Review of the MIP images confirms the above findings. CT ABDOMEN and PELVIS FINDINGS Hepatobiliary: The liver is normal in size. Non-cirrhotic configuration. No suspicious mass. No intrahepatic or extrahepatic bile  duct dilation. No calcified gallstones. Normal gallbladder wall thickness. No pericholecystic inflammatory changes. Pancreas: Unremarkable. No pancreatic ductal dilatation or surrounding inflammatory changes. Spleen: Within normal limits. No focal lesion. Adrenals/Urinary Tract: Adrenal glands are unremarkable. No suspicious renal mass. There is a 2.3 x 2.4 cm simple cyst in the left kidney interpolar region, anteriorly. There are additional several smaller cysts throughout bilateral kidneys. No hydronephrosis. No renal or ureteric calculi. Unremarkable urinary bladder. Stomach/Bowel: There is a small sliding hiatal hernia. No disproportionate dilation of the small or large bowel loops. No evidence of abnormal bowel wall thickening or inflammatory changes. The appendix is unremarkable. There are scattered diverticula mainly in the sigmoid colon, without imaging signs of diverticulitis. Vascular/Lymphatic: No ascites or pneumoperitoneum.  No abdominal or pelvic lymphadenopathy, by size criteria. No aneurysmal dilation of the major abdominal arteries. There are mild peripheral atherosclerotic vascular calcifications of the aorta and its major branches. Reproductive: The uterus is unremarkable. There is a 3.5 x 4.3 cm hypoattenuating structure in the left adnexa, most likely ovarian in etiology. The structure exhibits fluid attenuation and has no discrete mural nodule. Even though this is incompletely characterized on the current exam, favored to represent a senescent ovarian cyst. Annual follow-up ultrasound evaluation is recommended to documenting stability. Other: There is a tiny fat containing umbilical hernia. The soft tissues and abdominal wall are otherwise unremarkable. Musculoskeletal: No suspicious osseous lesions. There are moderate multilevel degenerative changes in the visualized spine. Review of the MIP images confirms the above findings. IMPRESSION: 1. No embolism to the proximal subsegmental pulmonary  artery level. 2. There are multiple solid noncalcified lung nodules, compatible with metastases. 3. No acute inflammatory process identified within the abdomen or pelvis. 4. Multiple other nonacute observations, as described above. Electronically Signed   By: Beula Brunswick M.D.   On: 12/21/2023 11:18   DG Chest Port 1 View Result Date: 12/21/2023 CLINICAL DATA:  89 year old female with possible sepsis. EXAM: PORTABLE CHEST 1 VIEW COMPARISON:  Portable chest 02/12/2023 and earlier. FINDINGS: Portable AP semi upright view at 0844 hours. Low lung volumes not significantly changed from last year. Stable cardiac size and mediastinal contours. Visualized tracheal air column is within normal limits. Allowing for portable technique the lungs are clear. Chronic cervical ACDF. Stable visualized osseous structures. Negative visible bowel gas. IMPRESSION: Stable with chronically low lung volumes. No acute cardiopulmonary abnormality. Electronically Signed   By: Marlise Simpers M.D.   On: 12/21/2023 09:02    Assessment and Plan Assessment & Plan  Asthma Lungs clear No wheezing Cont with prednisone , allegra , flonase   Gout  Cont with allopurinol  GERD Cont with lansoprazole   HLD  Cont with lipitor   CHF  Lungs clear  No lower extremity swelling  Cont with torsemide  Vit D Deficiency  Cont with vit d supplements  Hypokalemia Cont with potassium supplements  Fibromyalgia Cont with prednisone   Cont with tramadol       30 min Total time spent for obtaining history,  performing a medically appropriate examination and evaluation, reviewing the tests,   documenting clinical information in the electronic or other health record ,care coordination (not separately reported)

## 2024-04-25 ENCOUNTER — Encounter: Payer: Self-pay | Admitting: Sports Medicine

## 2024-05-01 ENCOUNTER — Ambulatory Visit: Admitting: Emergency Medicine

## 2024-05-06 ENCOUNTER — Ambulatory Visit
Admission: RE | Admit: 2024-05-06 | Discharge: 2024-05-06 | Disposition: A | Discharge status: Home / Self Care | Source: Ambulatory Visit | Attending: Emergency Medicine | Admitting: Emergency Medicine

## 2024-05-06 DIAGNOSIS — I2510 Atherosclerotic heart disease of native coronary artery without angina pectoris: Secondary | ICD-10-CM | POA: Diagnosis not present

## 2024-05-06 DIAGNOSIS — I700 Atherosclerosis of aorta: Secondary | ICD-10-CM | POA: Diagnosis not present

## 2024-05-06 DIAGNOSIS — R918 Other nonspecific abnormal finding of lung field: Secondary | ICD-10-CM

## 2024-05-19 DIAGNOSIS — L820 Inflamed seborrheic keratosis: Secondary | ICD-10-CM | POA: Diagnosis not present

## 2024-05-19 DIAGNOSIS — Z85828 Personal history of other malignant neoplasm of skin: Secondary | ICD-10-CM | POA: Diagnosis not present

## 2024-05-19 DIAGNOSIS — D485 Neoplasm of uncertain behavior of skin: Secondary | ICD-10-CM | POA: Diagnosis not present

## 2024-05-19 DIAGNOSIS — L821 Other seborrheic keratosis: Secondary | ICD-10-CM | POA: Diagnosis not present

## 2024-05-19 DIAGNOSIS — L570 Actinic keratosis: Secondary | ICD-10-CM | POA: Diagnosis not present

## 2024-05-23 ENCOUNTER — Encounter: Payer: Self-pay | Admitting: Sports Medicine

## 2024-05-23 ENCOUNTER — Non-Acute Institutional Stay: Admitting: Sports Medicine

## 2024-05-23 ENCOUNTER — Ambulatory Visit: Payer: Self-pay

## 2024-05-23 DIAGNOSIS — I10 Essential (primary) hypertension: Secondary | ICD-10-CM

## 2024-05-23 DIAGNOSIS — R110 Nausea: Secondary | ICD-10-CM | POA: Diagnosis not present

## 2024-05-23 DIAGNOSIS — I5032 Chronic diastolic (congestive) heart failure: Secondary | ICD-10-CM

## 2024-05-23 DIAGNOSIS — J45909 Unspecified asthma, uncomplicated: Secondary | ICD-10-CM | POA: Diagnosis not present

## 2024-05-23 DIAGNOSIS — N390 Urinary tract infection, site not specified: Secondary | ICD-10-CM | POA: Diagnosis not present

## 2024-05-23 DIAGNOSIS — N183 Chronic kidney disease, stage 3 unspecified: Secondary | ICD-10-CM | POA: Diagnosis not present

## 2024-05-23 LAB — BASIC METABOLIC PANEL WITH GFR
BUN: 12 (ref 4–21)
CO2: 21 (ref 13–22)
Chloride: 107 (ref 99–108)
Creatinine: 1 (ref 0.5–1.1)
Glucose: 114
Potassium: 4.1 meq/L (ref 3.5–5.1)
Sodium: 139 (ref 137–147)

## 2024-05-23 LAB — HEPATIC FUNCTION PANEL
ALT: 11 U/L (ref 7–35)
AST: 16 (ref 13–35)
Alkaline Phosphatase: 82 (ref 25–125)
Bilirubin, Total: 0.6

## 2024-05-23 LAB — COMPREHENSIVE METABOLIC PANEL WITH GFR
Albumin: 3.6 (ref 3.5–5.0)
Calcium: 9.4 (ref 8.7–10.7)
Globulin: 3.2

## 2024-05-23 LAB — CBC AND DIFFERENTIAL
HCT: 38 (ref 36–46)
Hemoglobin: 11.9 — AB (ref 12.0–16.0)
Platelets: 302 10*3/uL (ref 150–400)
WBC: 11.5

## 2024-05-23 LAB — CBC: RBC: 4.18 (ref 3.87–5.11)

## 2024-05-23 NOTE — Progress Notes (Signed)
 Provider:  Dr. Tye Gall Location:  [facility] Place of Service:   Assisted living   PCP: Mast, [name], NP [name], [name], [name]  Extended Emergency Contact Information Primary Emergency Contact: Debrew,Jacqueline  [place] Home Phone: [phone] Mobile Phone: [phone] Relation: Daughter  Goals of Care: Advanced Directive information    12/21/2023    8:00 PM  Advanced Directives  Does Patient Have a Medical Advance Directive? Yes  Type of Estate agent of [place];Living will  Does patient want to make changes to medical advance directive? No - Patient declined  Copy of Healthcare Power of Attorney in Chart? Yes - validated most recent copy scanned in chart (See row information)         History of Present Illness  89 year old female with a history of chronic diastolic heart failure, asthma, rheumatoid arthritis, GERD is evaluated for an acute visit per nursing report that patient has been feeling nauseous and had 1 episode of nonbilious vomiting. Patient seen and examined in her room.  She seems pleasant and comfortable and does not appear to be in distress.  She was smiling during the interview today. Patient reports that she woke up this morning feeling nauseous and threw up this morning.  She was able to tolerate applesauce this afternoon. Denies fevers, chills, runny nose, cough, congestion, sore throat, abdominal pain, dysuria, hematuria, bloody or dark-colored stools. Patient denies loose bowel stools.     Past Medical History:  Diagnosis Date   Adrenal failure (HCC)    Arthritis    Asthma    Cancer (HCC)    Cataract    Closed nondisplaced fracture of fifth right metatarsal bone 09/18/2017   Diverticulitis    Per patient   E coli bacteremia    Fibromyalgia 2008   GERD (gastroesophageal reflux disease) 12/18/2016   HCAP (healthcare-associated  pneumonia) 02/03/2018   Hyperlipidemia 10/13/2021   Osteoporosis    RA (rheumatoid arthritis) (HCC)    Recurrent upper respiratory infection (URI)    Sepsis due to urinary tract infection (HCC) 01/18/2018   Urticaria    Past Surgical History:  Procedure Laterality Date   BACK SURGERY     BILATERAL CARPAL TUNNEL RELEASE  2005   right and left   CATARACT EXTRACTION, BILATERAL  2004   right and left   CERVICAL FUSION  2011,2010,2008   2 disks   HEEL SPUR SURGERY  2004   lower back fusion  2011   Fusion of 3-4 and 4-5 lower back   RADIOFREQUENCY ABLATION  2020   ROTATOR CUFF REPAIR  [id number]   SQUAMOUS CELL CARCINOMA EXCISION     TONSILLECTOMY AND ADENOIDECTOMY  1947   TOTAL SHOULDER ARTHROPLASTY      reports that she quit smoking about 57 years ago. Her smoking use included cigarettes. She started smoking about 67 years ago. She has a 7.5 pack-year smoking history. She has never been exposed to tobacco smoke. She has never used smokeless tobacco. She reports that she does not drink alcohol and does not use drugs. Social History   Socioeconomic History   Marital status: Widowed    Spouse name: Not on file   Number of children: 2   Years of education: Not on file   Highest education level: Not on file  Occupational History   Occupation: Retired Child psychotherapist  Tobacco Use   Smoking status: Former    Current packs/day: 0.00  Average packs/day: 0.8 packs/day for 10.0 years (7.5 ttl pk-yrs)    Types: Cigarettes    Start date: 67    Quit date: 1968    Years since quitting: 57.4    Passive exposure: Never   Smokeless tobacco: Never  Vaping Use   Vaping status: Never Used  Substance and Sexual Activity   Alcohol use: No   Drug use: No   Sexual activity: Not Currently  Other Topics Concern   Not on file  Social History Narrative      Diet:        Do you drink/ eat things with caffeine? Dr. Kathlene Paradise 2/ day      Marital status: Widowed                               What year were you married ? 1953      Do you live in a house, apartment,assistred living, condo, trailer, etc.)? Apartment      Is it one or more stories?       How many persons live in your home ? 1      Do you have any pets in your home ?(please list) No      Highest Level of education completed: PhD       Current or past profession: Paramedic, Child psychotherapist, Special Educator       Do you exercise?   No                           Type & how often       ADVANCED DIRECTIVES (Please bring copies)      Do you have a living will? Yes      Do you have a DNR form?                       If not, do you want to discuss one? Yes      Do you have signed POA?HPOA forms?                 If so, please bring to your appointment Tes      FUNCTIONAL STATUS- To be completed by Spouse / child / Staff       Do you have difficulty bathing or dressing yourself ? No      Do you have difficulty preparing food or eating ? No      Do you have difficulty managing your mediation ? No      Do you have difficulty managing your finances ? No      Do you have difficulty affording your medication ? No      Social Drivers of Corporate investment banker Strain: Not on file  Food Insecurity: No Food Insecurity (12/21/2023)   Hunger Vital Sign    Worried About Running Out of Food in the Last Year: Never true    Ran Out of Food in the Last Year: Never true  Transportation Needs: No Transportation Needs (12/21/2023)   PRAPARE - Administrator, Civil Service (Medical): No    Lack of Transportation (Non-Medical): No  Physical Activity: Not on file  Stress: Not on file  Social Connections: Not on file  Intimate Partner Violence: Not At Risk (12/21/2023)   Humiliation, Afraid, Rape, and Kick questionnaire    Fear of Current or Ex-Partner: No  Emotionally Abused: No    Physically Abused: No    Sexually Abused: No    Functional Status Survey:    Family History  Problem Relation Age of  Onset   Heart attack Maternal Grandmother    Heart attack Paternal Grandfather    Breast cancer Mother 40   Diabetes Father    Heart disease Father    Congestive Heart Failure Father 46       Died from   Allergic rhinitis Neg Hx    Asthma Neg Hx    Eczema Neg Hx    Urticaria Neg Hx     Health Maintenance  Topic Date Due   COVID-19 Vaccine (6 - 2024-25 season) 11/21/2023   INFLUENZA VACCINE  07/11/2024   DTaP/Tdap/Td (2 - Td or Tdap) 04/03/2033   Pneumococcal Vaccine: 50+ Years  Completed   DEXA SCAN  Completed   Zoster Vaccines- Shingrix  Completed   HPV VACCINES  Aged Out   Meningococcal B Vaccine  Aged Out    Allergies  Allergen Reactions   Adhesive [Tape] Rash   Celebrex [Celecoxib] Hives   Ciprofibrate Nausea Only   Cymbalta [Duloxetine Hcl] Swelling   Gabitril [Tiagabine] Swelling   Lyrica [Pregabalin] Swelling   Neurontin [Gabapentin] Swelling   Nexium  [Esomeprazole ] Rash   Nsaids Rash   Shrimp [Shellfish Allergy] Anaphylaxis    Per patient shrimp only   Azactam  [Aztreonam ]     Hand swelling    Azelastine  Hcl     Rash    Ciprofloxacin  Other (See Comments)    dizziness   Claritin  [Loratadine ]     Irritability Nervousness    Methotrexate  Derivatives    Nasacort  [Triamcinolone ]     Dizzy    Olopatadine  Other (See Comments)    Pain and lethargy    Other    Sulfamethizole Other (See Comments)    unknown   Zantac [Ranitidine Hcl] Other (See Comments)    unknown   Claritin -D 12 Hour [Loratadine -Pseudoephedrine Er] Anxiety   Keflex  [Cephalexin ] Nausea And Vomiting    Tolerated Ancef    Penicillins Rash    Injection site reaction. Tolerated cefepime  in past. Also reports tolerating a penicillin infusion after this initial rxn ~20 yrs ago.  Has patient had a PCN reaction causing immediate rash, facial/tongue/throat swelling, SOB or lightheadedness with hypotension: No Has patient had a PCN reaction causing severe rash involving mucus membranes or skin  necrosis: No Has patient had a PCN reaction that required hospitalization No Has patient had a PCN reaction occurring within the last 10 years: No     Sulfa Antibiotics Nausea And Vomiting    Outpatient Encounter Medications as of 05/23/2024  Medication Sig   acetaminophen  (TYLENOL ) 500 MG tablet Take 650 mg by mouth in the morning and at bedtime.   allopurinol (ZYLOPRIM) 100 MG tablet Take 100 mg by mouth daily.   amLODipine  (NORVASC ) 5 MG tablet Take 5 mg by mouth daily.   atorvastatin  (LIPITOR) 20 MG tablet TAKE 1 TABLET BY MOUTH EVERY DAY   Biotin  5 MG TBDP Take 1 tablet (5 mg total) by mouth daily.   Cholecalciferol  (VITAMIN D3) 125 MCG (5000 UT) CAPS TAKE 1 CAPSULE BY MOUTH EVERY DAY   colchicine  0.6 MG tablet TAKE 1 TABLET BY MOUTH EVERY DAY   CRANBERRY PO Take 1 capsule by mouth 2 (two) times daily.   cycloSPORINE  (RESTASIS ) 0.05 % ophthalmic emulsion Place 1 drop into both eyes 2 (two) times daily.   D-Mannose 500 MG CAPS  Take 500 mg by mouth daily.   diclofenac  Sodium (VOLTAREN ) 1 % GEL APPLY 2 TO 4 GRAMS TOPICALLY TO AFFECTED JOINTS UP TO 4 TIMES DAILY   docusate sodium  (COLACE) 100 MG capsule Take 1 capsule (100 mg total) by mouth 2 (two) times daily.   fexofenadine  (ALLEGRA ) 180 MG tablet Take 1 tablet (180 mg total) by mouth daily.   ipratropium-albuterol  (DUONEB) 0.5-2.5 (3) MG/3ML SOLN Take 3 mLs by nebulization as needed.   lansoprazole  (PREVACID ) 30 MG capsule TAKE 1 CAPSULE BY MOUTH ONCE DAILY AT NOON (Patient taking differently: Take 30 mg by mouth daily in the afternoon.)   montelukast  (SINGULAIR ) 10 MG tablet TAKE 1 TABLET BY MOUTH EVERYDAY AT BEDTIME (Patient taking differently: Take 10 mg by mouth at bedtime.)   ondansetron  (ZOFRAN ) 4 MG tablet Take 4 mg by mouth as needed for nausea or vomiting.   potassium chloride  SA (KLOR-CON  M) 20 MEQ tablet Take 40 mEq by mouth 2 (two) times daily.   predniSONE  (DELTASONE ) 5 MG tablet Take 5 mg by mouth daily with breakfast.    saccharomyces boulardii (FLORASTOR) 250 MG capsule Take 250 mg by mouth 2 (two) times daily.   Torsemide 40 MG TABS Take 40 mg by mouth daily.   traMADol  (ULTRAM ) 50 MG tablet Take 1 tablet (50 mg total) by mouth every 6 (six) hours as needed (pain).   No facility-administered encounter medications on file as of 05/23/2024.    Review of Systems Negative unless indicated in HPI.  There were no vitals filed for this visit. There is no height or weight on file to calculate BMI. BP Readings from Last 3 Encounters:  04/25/24 (!) 146/73  03/19/24 134/62  03/11/24 138/78   Wt Readings from Last 3 Encounters:  04/25/24 162 lb (73.5 kg)  03/19/24 162 lb 9.6 oz (73.8 kg)  03/11/24 159 lb 9.6 oz (72.4 kg)   Physical Exam Constitutional:      Appearance: Normal appearance.  HENT:     Head: Normocephalic and atraumatic.   Cardiovascular:     Rate and Rhythm: Normal rate and regular rhythm.  Pulmonary:     Effort: Pulmonary effort is normal. No respiratory distress.     Breath sounds: Normal breath sounds. No wheezing.  Abdominal:     General: Bowel sounds are normal. There is no distension.     Tenderness: There is no abdominal tenderness. There is no guarding or rebound.     Comments:     Musculoskeletal:        General: No swelling.   Neurological:     Mental Status: She is alert. Mental status is at baseline.     Motor: No weakness.     Labs reviewed: Basic Metabolic Panel: Recent Labs    12/21/23 1639 12/22/23 0338 12/24/23 0346 12/25/23 0356 12/26/23 0418  NA  --    < > 140 139 140  K  --    < > 3.2* 3.3* 3.2*  CL  --    < > 113* 110 107  CO2  --    < > 23 23 25   GLUCOSE  --    < > 85 83 87  BUN  --    < > 15 11 7*  CREATININE 1.07*   < > 0.84 0.78 0.77  CALCIUM   --    < > 8.8* 8.6* 8.9  MG 1.6*  --  2.3 2.2 2.1  PHOS 3.6  --   --   --   --    < > =  values in this interval not displayed.   Liver Function Tests: Recent Labs    12/21/23 0900  12/22/23 0338 12/26/23 0418  AST 50* 45* 52*  ALT 28 26 57*  ALKPHOS 75 57 61  BILITOT 0.8 0.6 0.8  PROT 7.0 5.2* 6.1*  ALBUMIN  3.3* 2.5* 2.8*   Recent Labs    12/21/23 1639  LIPASE 20   Recent Labs    12/21/23 1130  AMMONIA 23   CBC: Recent Labs    12/21/23 0900 12/21/23 1639 12/22/23 0338 12/24/23 0346 12/26/23 0418  WBC 9.9   < > 7.5 8.1 10.0  NEUTROABS 6.8  --   --  3.5 4.7  HGB 13.7   < > 10.6* 10.3* 11.6*  HCT 44.7   < > 34.1* 32.8* 37.1  MCV 94.1   < > 93.9 92.4 92.8  PLT 226   < > 169 156 188   < > = values in this interval not displayed.   Cardiac Enzymes: No results for input(s): CKTOTAL, CKMB, CKMBINDEX, TROPONINI in the last 8760 hours. BNP: Invalid input(s): POCBNP Lab Results  Component Value Date   HGBA1C 6.5 11/28/2023   Lab Results  Component Value Date   TSH 0.865 02/27/2022   Lab Results  Component Value Date   VITAMINB12 1,377 (H) 05/26/2022   Lab Results  Component Value Date   FOLATE 26.8 02/06/2018   Lab Results  Component Value Date   IRON 89 02/06/2018   TIBC 281 02/06/2018   FERRITIN 74 02/06/2018    Imaging and Procedures obtained prior to SNF admission: CT Super D Chest Wo Contrast Result Date: 05/18/2024 CLINICAL DATA:  Follow-up pulmonary nodules * Tracking Code: BO * EXAM: CT CHEST WITHOUT CONTRAST TECHNIQUE: Multidetector CT imaging of the chest was performed using thin slice collimation for electromagnetic bronchoscopy planning purposes, without intravenous contrast. RADIATION DOSE REDUCTION: This exam was performed according to the departmental dose-optimization program which includes automated exposure control, adjustment of the mA and/or kV according to patient size and/or use of iterative reconstruction technique. COMPARISON:  12/21/2023 FINDINGS: Cardiovascular: Scattered aortic atherosclerosis. Normal heart size. Left coronary artery calcifications. No pericardial effusion. Mediastinum/Nodes: No enlarged  mediastinal, hilar, or axillary lymph nodes. Thyroid  gland, trachea, and esophagus demonstrate no significant findings. Lungs/Pleura: Numerous bilateral pulmonary nodules. Some of these are very slightly diminished in size, including the largest, cavitary nodule of the anterior left upper lobe measuring 1.6 x 1.5 cm, previously 1.8 x 1.8 cm (series 8, image 60). Other nodules unchanged, for example in the anterior right upper lobe a nodule measuring 1.0 x 0.8 cm (series 8, image 54). Diffuse background of very fine centrilobular nodularity throughout the lungs. Improved aeration of the lung bases with diminished scarring or atelectasis. No pleural effusion or pneumothorax. Upper Abdomen: No acute abnormality. Musculoskeletal: No chest wall abnormality. No acute osseous findings. IMPRESSION: 1. Numerous bilateral pulmonary nodules. Some of these are very slightly diminished in size, including the largest, cavitary nodule of the anterior left upper lobe measuring 1.6 x 1.5 cm, previously 1.8 x 1.8 cm. Other nodules unchanged. In the absence of known malignancy and treatment, these are most likely infectious or inflammatory and slightly improved in the interval. 2. Diffuse background of very fine centrilobular nodularity throughout the lungs, nonspecific and infectious or inflammatory. 3. Improved aeration of the lung bases with diminished scarring or atelectasis. 4. Coronary artery disease. Aortic Atherosclerosis (ICD10-I70.0). Electronically Signed   By: Fredricka Jenny M.D.   On: 05/18/2024 15:19  Assessment and Plan Assessment & Plan   Nausea Patient complains of feeling nauseous and had 1 episode of nonbilious vomiting Denies abdominal pain, fevers, dysuria Will get labs CBC, BMP Vitals stable Cont with zofran  prn   Hypertension Blood pressure 109/47 Patient denies feeling dizzy Continue with amlodipine  Monitor for dizziness Instructed patient to increase oral hydration  History of  asthma Patient able to speak in full sentences does not appear to be in distress No wheezing on auscultation Continue the Singulair , albuterol  as needed  History of CHF No crackles on auscultation no lower extremity swelling Continue with torsemide     30 min Total time spent for obtaining history,  performing a medically appropriate examination and evaluation, reviewing the tests,ordering  tests,  documenting clinical information in the electronic or other health record, care coordination (not separately reported)

## 2024-05-23 NOTE — Telephone Encounter (Signed)
 FYI Only or Action Required?: Action required by provider  Patient was last seen in primary care on 02/08/2023 by Mast, [name], NP. Called Nurse Triage reporting critical lab results. Symptoms began today. Interventions attempted: Nothing. Symptoms are: unchanged.  Triage Disposition: Call PCP Now  Patient/caregiver understands and will follow disposition?: Yes  Copied from CRM [id number]. Topic: Clinical - Lab/Test Results >> May 23, 2024  5:50 PM Tiffany H wrote: Reason for CRM: Ruffin Cotton, RN at [facility] in [place], called to report critical lab results. Please assist. Reason for Disposition  [1] Follow-up call from patient regarding patient's clinical status AND [2] information urgent  Answer Assessment - Initial Assessment Questions 1. REASON FOR CALL or QUESTION: What is your reason for calling today? or How can I best help you? or What question do you have that I can help answer?    Reporting stat lab results.  2. CALLER: Document the source of call. (e.g., laboratory, patient).     Ruffin Cotton [facility] in [place]. [phone] Dr. B ordered stat labs today, wbc 11.5. wondering if she needs antibiotics.  Call place to [facility] [phone] on call provider, left message to return call.  Protocols used: PCP Call - No Triage-A-AH

## 2024-05-26 ENCOUNTER — Non-Acute Institutional Stay: Payer: Self-pay | Admitting: Sports Medicine

## 2024-05-26 ENCOUNTER — Encounter: Payer: Self-pay | Admitting: Sports Medicine

## 2024-05-26 DIAGNOSIS — M545 Low back pain, unspecified: Secondary | ICD-10-CM | POA: Diagnosis not present

## 2024-05-26 DIAGNOSIS — G8929 Other chronic pain: Secondary | ICD-10-CM

## 2024-05-26 DIAGNOSIS — M0579 Rheumatoid arthritis with rheumatoid factor of multiple sites without organ or systems involvement: Secondary | ICD-10-CM | POA: Diagnosis not present

## 2024-05-26 DIAGNOSIS — R110 Nausea: Secondary | ICD-10-CM

## 2024-05-26 DIAGNOSIS — M1A9XX0 Chronic gout, unspecified, without tophus (tophi): Secondary | ICD-10-CM | POA: Diagnosis not present

## 2024-05-26 DIAGNOSIS — K219 Gastro-esophageal reflux disease without esophagitis: Secondary | ICD-10-CM | POA: Diagnosis not present

## 2024-05-26 NOTE — Progress Notes (Signed)
 Location:   [facility]ing Home Room Number: 925-A Place of Service:  ALF [phone]) Provider:  Fredy Jericho   PCP: Mast, [name], NP  [name], [name], [name]  Extended Emergency Contact Information Primary Emergency Contact: Debrew,Jacqueline  [place] Home Phone: [phone] Mobile Phone: [phone] Relation: Daughter  Code Status:  DNR Goals of care: Advanced Directive information    05/26/2024    2:08 PM  Advanced Directives  Does Patient Have a Medical Advance Directive? Yes  Type of Estate agent of [place];Living will;Out of facility DNR (pink MOST or yellow form)  Does patient want to make changes to medical advance directive? No - Patient declined  Copy of Healthcare Power of Attorney in Chart? Yes - validated most recent copy scanned in chart (See row information)     Chief Complaint  Patient presents with   Follow up on labs.    HPI:  Pt is a 89 y.o. female with past medical history of chronic diastolic heart failure, asthma, rheumatoid arthritis, GERD use seen today for an acute visit for follow-up on recent labs. Patient seen and examined in her room.  She is laying on her bed.  She opens eyes on calling her name.  She was pleasant and comfortable and does not appear to be in distress. Patient states that she has been feeling well.  Denies feeling nauseous and had no vomitings over the weekend.  She is able to keep her food down.  Denies abdominal pain, dysuria, hematuria, bloody or dark-colored stools. On labs, she was found to have mild leukocytosis her WBC count is 11.5. She is afebrile, vital stable.  Patient complains of chronic lower back pain.  No radiation to her lower legs.  No recent falls She ambulates without a walker. She takes Tramadol  as needed for pain.    Past Medical History:  Diagnosis Date   Adrenal failure (HCC)    Arthritis     Asthma    Cancer (HCC)    Cataract    Closed nondisplaced fracture of fifth right metatarsal bone 09/18/2017   Diverticulitis    Per patient   E coli bacteremia    Fibromyalgia 2008   GERD (gastroesophageal reflux disease) 12/18/2016   HCAP (healthcare-associated pneumonia) 02/03/2018   Hyperlipidemia 10/13/2021   Osteoporosis    RA (rheumatoid arthritis) (HCC)    Recurrent upper respiratory infection (URI)    Sepsis due to urinary tract infection (HCC) 01/18/2018   Urticaria    Past Surgical History:  Procedure Laterality Date   BACK SURGERY     BILATERAL CARPAL TUNNEL RELEASE  2005   right and left   CATARACT EXTRACTION, BILATERAL  2004   right and left   CERVICAL FUSION  2011,2010,2008   2 disks   HEEL SPUR SURGERY  2004   lower back fusion  2011   Fusion of 3-4 and 4-5 lower back   RADIOFREQUENCY ABLATION  2020   ROTATOR CUFF REPAIR  [id number]   SQUAMOUS CELL CARCINOMA EXCISION     TONSILLECTOMY AND ADENOIDECTOMY  1947   TOTAL SHOULDER ARTHROPLASTY      Allergies  Allergen Reactions   Adhesive [Tape] Rash   Celebrex [Celecoxib] Hives   Ciprofibrate Nausea Only   Cymbalta [Duloxetine Hcl] Swelling   Gabitril [Tiagabine] Swelling   Lyrica [Pregabalin] Swelling   Neurontin [Gabapentin] Swelling   Nexium  [Esomeprazole ] Rash   Nsaids Rash  Shrimp [Shellfish Allergy] Anaphylaxis    Per patient shrimp only   Azactam  [Aztreonam ]     Hand swelling    Azelastine  Hcl     Rash    Ciprofloxacin  Other (See Comments)    dizziness   Claritin  [Loratadine ]     Irritability Nervousness    Methotrexate  Derivatives    Nasacort  [Triamcinolone ]     Dizzy    Olopatadine  Other (See Comments)    Pain and lethargy    Other    Sulfamethizole Other (See Comments)    unknown   Zantac [Ranitidine Hcl] Other (See Comments)    unknown   Claritin -D 12 Hour [Loratadine -Pseudoephedrine Er] Anxiety   Keflex  [Cephalexin ] Nausea And Vomiting    Tolerated Ancef    Penicillins  Rash    Injection site reaction. Tolerated cefepime  in past. Also reports tolerating a penicillin infusion after this initial rxn ~20 yrs ago.  Has patient had a PCN reaction causing immediate rash, facial/tongue/throat swelling, SOB or lightheadedness with hypotension: No Has patient had a PCN reaction causing severe rash involving mucus membranes or skin necrosis: No Has patient had a PCN reaction that required hospitalization No Has patient had a PCN reaction occurring within the last 10 years: No     Sulfa Antibiotics Nausea And Vomiting    Allergies as of 05/26/2024       Reactions   Adhesive [tape] Rash   Celebrex [celecoxib] Hives   Ciprofibrate Nausea Only   Cymbalta [duloxetine Hcl] Swelling   Gabitril [tiagabine] Swelling   Lyrica [pregabalin] Swelling   Neurontin [gabapentin] Swelling   Nexium  [esomeprazole ] Rash   Nsaids Rash   Shrimp [shellfish Allergy] Anaphylaxis   Per patient shrimp only   Azactam  [aztreonam ]    Hand swelling    Azelastine  Hcl    Rash    Ciprofloxacin  Other (See Comments)   dizziness   Claritin  [loratadine ]    Irritability Nervousness    Methotrexate  Derivatives    Nasacort  [triamcinolone ]    Dizzy    Olopatadine  Other (See Comments)   Pain and lethargy    Other    Sulfamethizole Other (See Comments)   unknown   Zantac [ranitidine Hcl] Other (See Comments)   unknown   Claritin -d 12 Hour [loratadine -pseudoephedrine Er] Anxiety   Keflex  [cephalexin ] Nausea And Vomiting   Tolerated Ancef    Penicillins Rash   Injection site reaction. Tolerated cefepime  in past. Also reports tolerating a penicillin infusion after this initial rxn ~20 yrs ago.  Has patient had a PCN reaction causing immediate rash, facial/tongue/throat swelling, SOB or lightheadedness with hypotension: No Has patient had a PCN reaction causing severe rash involving mucus membranes or skin necrosis: No Has patient had a PCN reaction that required hospitalization No Has  patient had a PCN reaction occurring within the last 10 years: No   Sulfa Antibiotics Nausea And Vomiting        Medication List        Accurate as of May 26, 2024  2:08 PM. If you have any questions, ask your nurse or doctor.          acetaminophen  500 MG tablet Commonly known as: TYLENOL  Take 650 mg by mouth in the morning and at bedtime.   acetaminophen  325 MG tablet Commonly known as: TYLENOL  Take 650 mg by mouth 3 (three) times daily.   allopurinol 100 MG tablet Commonly known as: ZYLOPRIM Take 100 mg by mouth daily.   amLODipine  5 MG tablet Commonly known as: NORVASC  Take 5  mg by mouth daily.   atorvastatin  20 MG tablet Commonly known as: LIPITOR TAKE 1 TABLET BY MOUTH EVERY DAY   Biotin  5 MG Tbdp Take 1 tablet (5 mg total) by mouth daily.   colchicine  0.6 MG tablet TAKE 1 TABLET BY MOUTH EVERY DAY   CRANBERRY PO Take 1 capsule by mouth 2 (two) times daily.   cycloSPORINE  0.05 % ophthalmic emulsion Commonly known as: RESTASIS  Place 1 drop into both eyes 2 (two) times daily.   D-Mannose 500 MG Caps Take 500 mg by mouth daily.   diclofenac  Sodium 1 % Gel Commonly known as: VOLTAREN  APPLY 2 TO 4 GRAMS TOPICALLY TO AFFECTED JOINTS UP TO 4 TIMES DAILY   docusate sodium  100 MG capsule Commonly known as: Colace Take 1 capsule (100 mg total) by mouth 2 (two) times daily.   fexofenadine  60 MG tablet Commonly known as: ALLEGRA  Take 60 mg by mouth daily.   fexofenadine  180 MG tablet Commonly known as: ALLEGRA  Take 1 tablet (180 mg total) by mouth daily.   ipratropium-albuterol  0.5-2.5 (3) MG/3ML Soln Commonly known as: DUONEB Take 3 mLs by nebulization as needed.   lansoprazole  30 MG capsule Commonly known as: PREVACID  TAKE 1 CAPSULE BY MOUTH ONCE DAILY AT NOON   lidocaine  4 % Place 1 patch onto the skin daily. Apply to right ankle   montelukast  10 MG tablet Commonly known as: SINGULAIR  TAKE 1 TABLET BY MOUTH EVERYDAY AT BEDTIME    ondansetron  4 MG tablet Commonly known as: ZOFRAN  Take 4 mg by mouth as needed for nausea or vomiting.   potassium chloride  SA 20 MEQ tablet Commonly known as: KLOR-CON  M Take 40 mEq by mouth 2 (two) times daily.   predniSONE  5 MG tablet Commonly known as: DELTASONE  Take 5 mg by mouth daily with breakfast.   saccharomyces boulardii 250 MG capsule Commonly known as: FLORASTOR Take 250 mg by mouth 2 (two) times daily.   Torsemide 40 MG Tabs Take 40 mg by mouth daily.   traMADol  50 MG tablet Commonly known as: ULTRAM  Take 50 mg by mouth 2 (two) times daily.   traMADol  50 MG tablet Commonly known as: ULTRAM  Take 1 tablet (50 mg total) by mouth every 6 (six) hours as needed (pain).   cholecalciferol  25 MCG (1000 UNIT) tablet Commonly known as: VITAMIN D3 Take 1,000 Units by mouth daily.   Vitamin D3 125 MCG (5000 UT) Caps TAKE 1 CAPSULE BY MOUTH EVERY DAY        Review of Systems  Constitutional:  Negative for chills and fever.  HENT:  Negative for sinus pressure and sore throat.   Respiratory:  Negative for cough, shortness of breath and wheezing.   Cardiovascular:  Negative for chest pain, palpitations and leg swelling.  Gastrointestinal:  Negative for abdominal distention, abdominal pain, blood in stool, constipation, diarrhea, nausea and vomiting.  Genitourinary:  Negative for dysuria, frequency and urgency.  Musculoskeletal:  Positive for back pain.  Neurological:  Negative for dizziness, weakness and numbness.  Psychiatric/Behavioral:  Negative for confusion.     Immunization History  Administered Date(s) Administered   Fluad Quad(high Dose 65+) 09/30/2020   Influenza, High Dose Seasonal PF 09/24/2015, 08/24/2016, 08/16/2018, 09/08/2019, 09/24/2020, 10/04/2021, 09/12/2023   Influenza,inj,Quad PF,6+ Mos 08/11/2016   Influenza-Unspecified 10/04/2012, 08/19/2013, 08/28/2014, 11/12/2017, 10/05/2022   Moderna Covid-19 Vaccine Bivalent Booster 74yrs & up  10/04/2021, 09/26/2023   Moderna Sars-Covid-2 Vaccination 12/15/2019, 01/12/2020, 10/19/2020   PNEUMOCOCCAL CONJUGATE-20 10/04/2021   PPD Test 03/01/2018  Pneumococcal Conjugate-13 09/13/2015, 06/26/2017   Pneumococcal Polysaccharide-23 12/11/2009   Tdap 04/04/2023   Zoster Recombinant(Shingrix) 01/28/2019, 09/02/2019   Pertinent  Health Maintenance Due  Topic Date Due   INFLUENZA VACCINE  07/11/2024   DEXA SCAN  Completed      01/31/2023    1:08 PM 02/19/2023    2:29 PM 03/06/2023   11:57 AM 03/13/2023   10:48 AM 03/22/2023   10:53 AM  Fall Risk  Falls in the past year? 0 1 0 0 0  Was there an injury with Fall? 0 1 0 0 0  Fall Risk Category Calculator 0 3 0 0 0  Patient at Risk for Falls Due to Impaired balance/gait;Impaired mobility History of fall(s);Impaired balance/gait History of fall(s);Impaired balance/gait History of fall(s);Impaired balance/gait History of fall(s);Impaired balance/gait  Fall risk Follow up Falls evaluation completed Falls evaluation completed Falls evaluation completed Falls evaluation completed Falls evaluation completed   Functional Status Survey:    Vitals:   05/26/24 1354  BP: 129/67  Pulse: 85  Resp: 19  Temp: (!) 97.3 F (36.3 C)  SpO2: 96%  Weight: 163 lb 6.4 oz (74.1 kg)  Height: 5' 5 (1.651 m)   Body mass index is 27.19 kg/m. Physical Exam Constitutional:      Appearance: Normal appearance.  HENT:     Head: Normocephalic and atraumatic.   Cardiovascular:     Rate and Rhythm: Normal rate and regular rhythm.  Pulmonary:     Effort: Pulmonary effort is normal. No respiratory distress.     Breath sounds: Normal breath sounds. No wheezing.  Abdominal:     General: Bowel sounds are normal. There is no distension.     Tenderness: There is no abdominal tenderness. There is no guarding or rebound.     Comments:     Musculoskeletal:        General: No swelling or tenderness.   Neurological:     Mental Status: She is alert. Mental  status is at baseline.     Motor: No weakness.     Labs reviewed: Recent Labs    12/21/23 1639 12/22/23 0338 12/24/23 0346 12/25/23 0356 12/26/23 0418 05/23/24 0000  NA  --    < > 140 139 140 139  K  --    < > 3.2* 3.3* 3.2* 4.1  CL  --    < > 113* 110 107 107  CO2  --    < > 23 23 25 21   GLUCOSE  --    < > 85 83 87  --   BUN  --    < > 15 11 7* 12  CREATININE 1.07*   < > 0.84 0.78 0.77 1.0  CALCIUM   --    < > 8.8* 8.6* 8.9 9.4  MG 1.6*  --  2.3 2.2 2.1  --   PHOS 3.6  --   --   --   --   --    < > = values in this interval not displayed.   Recent Labs    12/21/23 0900 12/22/23 0338 12/26/23 0418 05/23/24 0000  AST 50* 45* 52* 16  ALT 28 26 57* 11  ALKPHOS 75 57 61 82  BILITOT 0.8 0.6 0.8  --   PROT 7.0 5.2* 6.1*  --   ALBUMIN  3.3* 2.5* 2.8* 3.6   Recent Labs    12/21/23 0900 12/21/23 1639 12/22/23 0338 12/24/23 0346 12/26/23 0418 05/23/24 0000  WBC 9.9   < > 7.5  8.1 10.0 11.5  NEUTROABS 6.8  --   --  3.5 4.7  --   HGB 13.7   < > 10.6* 10.3* 11.6* 11.9*  HCT 44.7   < > 34.1* 32.8* 37.1 38  MCV 94.1   < > 93.9 92.4 92.8  --   PLT 226   < > 169 156 188 302   < > = values in this interval not displayed.   Lab Results  Component Value Date   TSH 0.865 02/27/2022   Lab Results  Component Value Date   HGBA1C 6.5 11/28/2023   Lab Results  Component Value Date   CHOL 151 06/05/2023   HDL 47 06/05/2023   LDLCALC 77 06/05/2023   TRIG 174 (A) 06/05/2023   CHOLHDL 2.6 04/06/2020    Significant Diagnostic Results in last 30 days:  CT Super D Chest Wo Contrast Result Date: 05/18/2024 CLINICAL DATA:  Follow-up pulmonary nodules * Tracking Code: BO * EXAM: CT CHEST WITHOUT CONTRAST TECHNIQUE: Multidetector CT imaging of the chest was performed using thin slice collimation for electromagnetic bronchoscopy planning purposes, without intravenous contrast. RADIATION DOSE REDUCTION: This exam was performed according to the departmental dose-optimization program  which includes automated exposure control, adjustment of the mA and/or kV according to patient size and/or use of iterative reconstruction technique. COMPARISON:  12/21/2023 FINDINGS: Cardiovascular: Scattered aortic atherosclerosis. Normal heart size. Left coronary artery calcifications. No pericardial effusion. Mediastinum/Nodes: No enlarged mediastinal, hilar, or axillary lymph nodes. Thyroid  gland, trachea, and esophagus demonstrate no significant findings. Lungs/Pleura: Numerous bilateral pulmonary nodules. Some of these are very slightly diminished in size, including the largest, cavitary nodule of the anterior left upper lobe measuring 1.6 x 1.5 cm, previously 1.8 x 1.8 cm (series 8, image 60). Other nodules unchanged, for example in the anterior right upper lobe a nodule measuring 1.0 x 0.8 cm (series 8, image 54). Diffuse background of very fine centrilobular nodularity throughout the lungs. Improved aeration of the lung bases with diminished scarring or atelectasis. No pleural effusion or pneumothorax. Upper Abdomen: No acute abnormality. Musculoskeletal: No chest wall abnormality. No acute osseous findings. IMPRESSION: 1. Numerous bilateral pulmonary nodules. Some of these are very slightly diminished in size, including the largest, cavitary nodule of the anterior left upper lobe measuring 1.6 x 1.5 cm, previously 1.8 x 1.8 cm. Other nodules unchanged. In the absence of known malignancy and treatment, these are most likely infectious or inflammatory and slightly improved in the interval. 2. Diffuse background of very fine centrilobular nodularity throughout the lungs, nonspecific and infectious or inflammatory. 3. Improved aeration of the lung bases with diminished scarring or atelectasis. 4. Coronary artery disease. Aortic Atherosclerosis (ICD10-I70.0). Electronically Signed   By: Fredricka Jenny M.D.   On: 05/18/2024 15:19    Assessment/Plan   Nausea Patient denies abdominal pain, nausea She did  not had any more vomitings over the weekend Vital stable Will monitor  Rheumatoid arthritis No recent flareup Continue with the tramadol    Chronic lower back pain No red flag signs Continue with tramadol  Will start lidocaine  patch  History of gout Stable Continue with allopurinol  GERD Continue with lansoprazole .   30 min Total time spent for obtaining history,  performing a medically appropriate examination and evaluation, reviewing the tests, documenting clinical information in the electronic or other health record, ,care coordination (not separately reported)

## 2024-05-26 NOTE — Telephone Encounter (Signed)
 Assisted living resident at [facility]

## 2024-05-28 ENCOUNTER — Ambulatory Visit: Admitting: Emergency Medicine

## 2024-06-18 ENCOUNTER — Other Ambulatory Visit: Payer: Self-pay | Admitting: Orthopedic Surgery

## 2024-06-18 DIAGNOSIS — G8929 Other chronic pain: Secondary | ICD-10-CM

## 2024-06-18 DIAGNOSIS — I5032 Chronic diastolic (congestive) heart failure: Secondary | ICD-10-CM | POA: Diagnosis not present

## 2024-06-18 DIAGNOSIS — R918 Other nonspecific abnormal finding of lung field: Secondary | ICD-10-CM | POA: Diagnosis not present

## 2024-06-18 DIAGNOSIS — Z515 Encounter for palliative care: Secondary | ICD-10-CM | POA: Diagnosis not present

## 2024-06-18 MED ORDER — TRAMADOL HCL 50 MG PO TABS
50.0000 mg | ORAL_TABLET | Freq: Four times a day (QID) | ORAL | 0 refills | Status: DC | PRN
Start: 2024-06-18 — End: 2024-08-08

## 2024-06-18 MED ORDER — TRAMADOL HCL 50 MG PO TABS: 50.0000 mg | ORAL_TABLET | Freq: Four times a day (QID) | ORAL | 0 refills | Status: DC | PRN

## 2024-07-10 ENCOUNTER — Non-Acute Institutional Stay: Payer: Self-pay | Admitting: Nurse Practitioner

## 2024-07-10 ENCOUNTER — Encounter: Payer: Self-pay | Admitting: Nurse Practitioner

## 2024-07-10 DIAGNOSIS — K219 Gastro-esophageal reflux disease without esophagitis: Secondary | ICD-10-CM | POA: Diagnosis not present

## 2024-07-10 DIAGNOSIS — I5032 Chronic diastolic (congestive) heart failure: Secondary | ICD-10-CM

## 2024-07-10 DIAGNOSIS — M797 Fibromyalgia: Secondary | ICD-10-CM

## 2024-07-10 DIAGNOSIS — M8580 Other specified disorders of bone density and structure, unspecified site: Secondary | ICD-10-CM

## 2024-07-10 DIAGNOSIS — M1A9XX0 Chronic gout, unspecified, without tophus (tophi): Secondary | ICD-10-CM | POA: Diagnosis not present

## 2024-07-10 DIAGNOSIS — M5116 Intervertebral disc disorders with radiculopathy, lumbar region: Secondary | ICD-10-CM | POA: Diagnosis not present

## 2024-07-10 DIAGNOSIS — R7303 Prediabetes: Secondary | ICD-10-CM

## 2024-07-10 NOTE — Assessment & Plan Note (Signed)
 Stable, on Prevacid . Hgb 11.9 05/23/24

## 2024-07-10 NOTE — Assessment & Plan Note (Signed)
 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  Diclofenac , MRI completed, Neurosurgery consulted, not a good surgical candidate.

## 2024-07-10 NOTE — Assessment & Plan Note (Signed)
RA/fibromyalgia, taking steroids, and Tramadol, TSH 0.865 02/27/22 

## 2024-07-10 NOTE — Assessment & Plan Note (Signed)
 R ankle, stable, on allopurinol. Uric 6.5 02/05/24

## 2024-07-10 NOTE — Assessment & Plan Note (Signed)
Chronic diastolic heart failure, DOE, on and off wheezing, cough, occasional yellow phlegm in am. CXR no evidence of acute infection 10/2022. EF 60-65% 05/22/22, changed to Torsemide, on Amlodipine

## 2024-07-10 NOTE — Assessment & Plan Note (Signed)
 Hgb a1c 6.5 12/21/23,  prediabetic, steroid induced.

## 2024-07-10 NOTE — Assessment & Plan Note (Signed)
OP Rheumatology, off Alendronate, on Vit D, t score -1.8 06/06/21

## 2024-07-10 NOTE — Progress Notes (Signed)
 Location:  [facility]  N[facility]ing Home Room Number: 925-A Place of Service:  ALF [phone]) Provider:  Larry Knipp X, NP   Magnus Crescenzo X, NP  Patient Care Team: Gara Kincade X, [name]  Extended Emergency Contact Information Primary Emergency Contact: Debrew,Jacqueline  [place] Home Phone: [phone] Mobile Phone: [phone] Relation: Daughter  Code Status: DNR Goals of care: Advanced Directive information    07/10/2024   10:41 AM  Advanced Directives  Does Patient Have a Medical Advance Directive? Yes  Type of Estate agent of [place];Out of facility DNR (pink MOST or yellow form)  Does patient want to make changes to medical advance directive? No - Patient declined  Copy of Healthcare Power of Attorney in Chart? Yes - validated most recent copy scanned in chart (See row information)     Chief Complaint  Patient presents with   Medical Management of Chronic Issues    Routine Follow Up     HPI:  Pt is a 89 y.o. female seen today for medical management of chronic diseases.     Chronic diastolic heart failure, DOE, on and off wheezing, cough, occasional yellow phlegm in am. CXR no evidence of acute infection 10/2022. 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  Diclofenac , MRI completed, Neurosurgery consulted, not a good surgical candidate.                           Hgb a1c 6.5 12/21/23,  prediabetic, steroid induced.              The right middle finger stiffness, difficulty eating.  Hypokalemia,  on Kcl             Hx of sinus tachycardia, heart rate is in control.              Hx of aute sigmoid diverticulitis with pneumoperitoneum, f/u Dr. Stevie, diarrhea on and off, negative C-diff in the past.              Asthma Epinephrine  prn, Allegra , Flonase , Singulair               RA/fibromyalgia, taking steroids, and Tramadol , TSH 0.865 02/27/22             GERD, on Prevacid . Hgb 11.9 05/23/24             Venous insufficiency/Edema , takes Torsemide, Kcl.             OP Rheumatology, off Alendronate , on Vit D, t score -1.8 06/06/21             Hyperlipidemia, takes Atorvastatin , LDL 77 06/05/23             Vit B12 deficiency, on Vit B12 po, Vit B12 235 06/20/21             CT left adnexa cyst 11/2020, MRI 05/08/21,  05/22/22 renal US  shoed No evidence of renal mass or hydronephrosis.             Gout, R ankle, stable, on allopurinol. Uric 6.5 02/05/24             Hx of DVT, venous US  BLE was negative for DVT while in hospital. Off Eliquis .              HTN, on Amlodipine . Bun/creat 12/1.0  05/23/24      Past Medical History:  Diagnosis Date   Adrenal failure (HCC)    Arthritis    Asthma    Cancer (HCC)    Cataract    Closed nondisplaced fracture of fifth right metatarsal bone 09/18/2017   Diverticulitis    Per patient   E coli bacteremia    Fibromyalgia 2008   GERD (gastroesophageal reflux disease) 12/18/2016   HCAP (healthcare-associated pneumonia) 02/03/2018   Hyperlipidemia 10/13/2021   Osteoporosis    RA (rheumatoid arthritis) (HCC)    Recurrent upper respiratory infection (URI)    Sepsis due to urinary tract infection (HCC) 01/18/2018   Urticaria    Past Surgical History:  Procedure Laterality Date   BACK SURGERY     BILATERAL CARPAL TUNNEL RELEASE  2005   right and left   CATARACT EXTRACTION, BILATERAL  2004   right and left   CERVICAL FUSION  2011,2010,2008   2 disks   HEEL SPUR SURGERY  2004   lower back fusion  2011   Fusion of 3-4 and 4-5 lower back   RADIOFREQUENCY ABLATION  2020   ROTATOR CUFF REPAIR  [id number]   SQUAMOUS CELL CARCINOMA EXCISION     TONSILLECTOMY AND ADENOIDECTOMY  1947   TOTAL SHOULDER ARTHROPLASTY      Allergies  Allergen Reactions   Adhesive [Tape] Rash   Celebrex [Celecoxib] Hives   Ciprofibrate Nausea Only    Cymbalta [Duloxetine Hcl] Swelling   Gabitril [Tiagabine] Swelling   Lyrica [Pregabalin] Swelling   Neurontin [Gabapentin] Swelling   Nexium  [Esomeprazole ] Rash   Nsaids Rash   Shrimp [Shellfish Allergy] Anaphylaxis    Per patient shrimp only   Azactam  [Aztreonam ]     Hand swelling    Azelastine  Hcl     Rash    Ciprofloxacin  Other (See Comments)    dizziness   Claritin  [Loratadine ]     Irritability Nervousness    Methotrexate  Derivatives    Nasacort  [Triamcinolone ]     Dizzy    Olopatadine  Other (See Comments)    Pain and lethargy    Other    Sulfamethizole Other (See Comments)    unknown   Zantac [Ranitidine Hcl] Other (See Comments)    unknown   Claritin -D 12 Hour [Loratadine -Pseudoephedrine Er] Anxiety   Keflex  [Cephalexin ] Nausea And Vomiting    Tolerated Ancef    Penicillins Rash    Injection site reaction. Tolerated cefepime  in past. Also reports tolerating a penicillin infusion after this initial rxn ~20 yrs ago.  Has patient had a PCN reaction causing immediate rash, facial/tongue/throat swelling, SOB or lightheadedness with hypotension: No Has patient had a PCN reaction causing severe rash involving mucus membranes or skin necrosis: No Has patient had a PCN reaction that required hospitalization No Has patient had a PCN reaction occurring within the last 10 years: No     Sulfa Antibiotics Nausea And Vomiting    Allergies as of 07/10/2024       Reactions   Adhesive [tape] Rash   Celebrex [celecoxib] Hives   Ciprofibrate Nausea Only   Cymbalta [duloxetine Hcl] Swelling   Gabitril [tiagabine] Swelling   Lyrica [pregabalin] Swelling   Neurontin [gabapentin] Swelling   Nexium  [esomeprazole ] Rash   Nsaids Rash   Shrimp [shellfish Allergy] Anaphylaxis   Per patient shrimp only   Azactam  [aztreonam ]    Hand swelling    Azelastine  Hcl    Rash    Ciprofloxacin  Other (See Comments)   dizziness   Claritin  [loratadine ]  Irritability Nervousness     Methotrexate  Derivatives    Nasacort  [triamcinolone ]    Dizzy    Olopatadine  Other (See Comments)   Pain and lethargy    Other    Sulfamethizole Other (See Comments)   unknown   Zantac [ranitidine Hcl] Other (See Comments)   unknown   Claritin -d 12 Hour [loratadine -pseudoephedrine Er] Anxiety   Keflex  [cephalexin ] Nausea And Vomiting   Tolerated Ancef    Penicillins Rash   Injection site reaction. Tolerated cefepime  in past. Also reports tolerating a penicillin infusion after this initial rxn ~20 yrs ago.  Has patient had a PCN reaction causing immediate rash, facial/tongue/throat swelling, SOB or lightheadedness with hypotension: No Has patient had a PCN reaction causing severe rash involving mucus membranes or skin necrosis: No Has patient had a PCN reaction that required hospitalization No Has patient had a PCN reaction occurring within the last 10 years: No   Sulfa Antibiotics Nausea And Vomiting        Medication List        Accurate as of July 10, 2024 11:59 PM. If you have any questions, ask your nurse or doctor.          STOP taking these medications    colchicine  0.6 MG tablet Stopped by: Bijou Easler X Brantlee Hinde   CRANBERRY PO Stopped by: Deon Duer X Lameeka Schleifer       TAKE these medications    acetaminophen  325 MG tablet Commonly known as: TYLENOL  Take 650 mg by mouth 3 (three) times daily. What changed: Another medication with the same name was removed. Continue taking this medication, and follow the directions you see here. Changed by: Indie Nickerson X Edan Serratore   allopurinol 100 MG tablet Commonly known as: ZYLOPRIM Take 100 mg by mouth daily.   amLODipine  5 MG tablet Commonly known as: NORVASC  Take 5 mg by mouth daily.   atorvastatin  20 MG tablet Commonly known as: LIPITOR TAKE 1 TABLET BY MOUTH EVERY DAY   Biotin  5 MG Tbdp Take 1 tablet (5 mg total) by mouth daily.   cholecalciferol  25 MCG (1000 UNIT) tablet Commonly known as: VITAMIN D3 Take 1,000 Units by mouth daily. What  changed: Another medication with the same name was removed. Continue taking this medication, and follow the directions you see here. Changed by: Jiyan Walkowski X Blayden Conwell   cycloSPORINE  0.05 % ophthalmic emulsion Commonly known as: RESTASIS  Place 1 drop into both eyes 2 (two) times daily.   D-Mannose 500 MG Caps Take 500 mg by mouth daily.   diclofenac  Sodium 1 % Gel Commonly known as: VOLTAREN  APPLY 2 TO 4 GRAMS TOPICALLY TO AFFECTED JOINTS UP TO 4 TIMES DAILY   docusate sodium  100 MG capsule Commonly known as: Colace Take 1 capsule (100 mg total) by mouth 2 (two) times daily.   fexofenadine  60 MG tablet Commonly known as: ALLEGRA  Take 60 mg by mouth daily. What changed: Another medication with the same name was removed. Continue taking this medication, and follow the directions you see here. Changed by: Linh Johannes X Makailah Slavick   ipratropium-albuterol  0.5-2.5 (3) MG/3ML Soln Commonly known as: DUONEB Take 3 mLs by nebulization as needed.   lansoprazole  30 MG capsule Commonly known as: PREVACID  TAKE 1 CAPSULE BY MOUTH ONCE DAILY AT NOON   lidocaine  4 % Place 1 patch onto the skin daily. Apply to right ankle   montelukast  10 MG tablet Commonly known as: SINGULAIR  TAKE 1 TABLET BY MOUTH EVERYDAY AT BEDTIME   ondansetron  4 MG tablet Commonly known as: ZOFRAN   Take 4 mg by mouth as needed for nausea or vomiting.   potassium chloride  SA 20 MEQ tablet Commonly known as: KLOR-CON  M Take 40 mEq by mouth 2 (two) times daily.   predniSONE  5 MG tablet Commonly known as: DELTASONE  Take 5 mg by mouth daily with breakfast.   saccharomyces boulardii 250 MG capsule Commonly known as: FLORASTOR Take 250 mg by mouth 2 (two) times daily.   Torsemide 40 MG Tabs Take 40 mg by mouth daily.   traMADol  50 MG tablet Commonly known as: ULTRAM  Take 50 mg by mouth 2 (two) times daily.   traMADol  50 MG tablet Commonly known as: ULTRAM  Take 1 tablet (50 mg total) by mouth every 6 (six) hours as needed (pain).         Review of Systems  Constitutional:  Negative for appetite change, fatigue and fever.  HENT:  Negative for congestion, sinus pressure and trouble swallowing.   Eyes:  Negative for visual disturbance.  Respiratory:  Positive for shortness of breath. Negative for cough, chest tightness and wheezing.        DOE, occasional yellow phlegm in am, hacking cough.   Cardiovascular:  Negative for leg swelling.  Gastrointestinal:  Negative for abdominal pain and constipation.  Genitourinary:  Negative for dysuria, frequency and urgency.       Incontinent of urine.   Musculoskeletal:  Positive for arthralgias, back pain, gait problem and myalgias.       Right lower back hip pain, travels down to the right leg. Left groin/hip region pain with movement, able to walk if Tramadol  use.   Skin:  Negative for color change.  Neurological:  Negative for speech difficulty, weakness and headaches.  Psychiatric/Behavioral:  Negative for confusion and sleep disturbance. The patient is not nervous/anxious.     Immunization History  Administered Date(s) Administered   Fluad Quad(high Dose 65+) 09/30/2020   Influenza, High Dose Seasonal PF 09/24/2015, 08/24/2016, 08/16/2018, 09/08/2019, 09/24/2020, 10/04/2021, 09/12/2023   Influenza,inj,Quad PF,6+ Mos 08/11/2016   Influenza-Unspecified 10/04/2012, 08/19/2013, 08/28/2014, 11/12/2017, 10/05/2022   Moderna Covid-19 Vaccine Bivalent Booster 6yrs & up 10/04/2021, 09/26/2023   Moderna Sars-Covid-2 Vaccination 12/15/2019, 01/12/2020, 10/19/2020   PNEUMOCOCCAL CONJUGATE-20 10/04/2021   PPD Test 03/01/2018   Pneumococcal Conjugate-13 09/13/2015, 06/26/2017   Pneumococcal Polysaccharide-23 12/11/2009   Tdap 04/04/2023   Zoster Recombinant(Shingrix) 01/28/2019, 09/02/2019   Pertinent  Health Maintenance Due  Topic Date Due   INFLUENZA VACCINE  07/11/2024   DEXA SCAN  Completed      01/31/2023    1:08 PM 02/19/2023    2:29 PM 03/06/2023   11:57 AM 03/13/2023    10:48 AM 03/22/2023   10:53 AM  Fall Risk  Falls in the past year? 0 1 0 0 0  Was there an injury with Fall? 0 1 0 0 0  Fall Risk Category Calculator 0 3 0 0 0  Patient at Risk for Falls Due to Impaired balance/gait;Impaired mobility History of fall(s);Impaired balance/gait History of fall(s);Impaired balance/gait History of fall(s);Impaired balance/gait History of fall(s);Impaired balance/gait  Fall risk Follow up Falls evaluation completed Falls evaluation completed Falls evaluation completed Falls evaluation completed Falls evaluation completed   Functional Status Survey:    Vitals:   07/10/24 1026  BP: (!) 144/88  Pulse: 64  Resp: 19  Temp: 97.7 F (36.5 C)  SpO2: 96%  Weight: 165 lb 9.6 oz (75.1 kg)  Height: 5' 5 (1.651 m)   Body mass index is 27.56 kg/m. Physical Exam Vitals and nursing  note reviewed.  Constitutional:      Appearance: Normal appearance.  HENT:     Head: Normocephalic and atraumatic.     Nose: Nose normal.     Mouth/Throat:     Mouth: Mucous membranes are moist.     Pharynx: No oropharyngeal exudate or posterior oropharyngeal erythema.  Eyes:     Extraocular Movements: Extraocular movements intact.     Conjunctiva/sclera: Conjunctivae normal.     Pupils: Pupils are equal, round, and reactive to light.  Cardiovascular:     Rate and Rhythm: Normal rate and regular rhythm.     Heart sounds: No murmur heard. Pulmonary:     Effort: Pulmonary effort is normal.     Breath sounds: No rales.     Comments: Central congestion Abdominal:     General: Bowel sounds are normal.     Palpations: Abdomen is soft.     Tenderness: There is no abdominal tenderness.  Musculoskeletal:     Cervical back: Normal range of motion and neck supple.     Right lower leg: No edema.     Left lower leg: No edema.  Skin:    General: Skin is warm and dry.     Comments: Lots of moles. Chronic erythema venous insufficiency skin changes BLE. Scabbed area right intergluteal  cleft.   Neurological:     General: No focal deficit present.     Mental Status: She is alert and oriented to person, place, and time. Mental status is at baseline.     Gait: Gait abnormal.  Psychiatric:        Mood and Affect: Mood normal.        Behavior: Behavior normal.        Thought Content: Thought content normal.        Judgment: Judgment normal.     Labs reviewed: Recent Labs    12/21/23 1639 12/22/23 0338 12/24/23 0346 12/25/23 0356 12/26/23 0418 05/23/24 0000  NA  --    < > 140 139 140 139  K  --    < > 3.2* 3.3* 3.2* 4.1  CL  --    < > 113* 110 107 107  CO2  --    < > 23 23 25 21   GLUCOSE  --    < > 85 83 87  --   BUN  --    < > 15 11 7* 12  CREATININE 1.07*   < > 0.84 0.78 0.77 1.0  CALCIUM   --    < > 8.8* 8.6* 8.9 9.4  MG 1.6*  --  2.3 2.2 2.1  --   PHOS 3.6  --   --   --   --   --    < > = values in this interval not displayed.   Recent Labs    12/21/23 0900 12/22/23 0338 12/26/23 0418 05/23/24 0000  AST 50* 45* 52* 16  ALT 28 26 57* 11  ALKPHOS 75 57 61 82  BILITOT 0.8 0.6 0.8  --   PROT 7.0 5.2* 6.1*  --   ALBUMIN  3.3* 2.5* 2.8* 3.6   Recent Labs    12/21/23 0900 12/21/23 1639 12/22/23 0338 12/24/23 0346 12/26/23 0418 05/23/24 0000  WBC 9.9   < > 7.5 8.1 10.0 11.5  NEUTROABS 6.8  --   --  3.5 4.7  --   HGB 13.7   < > 10.6* 10.3* 11.6* 11.9*  HCT 44.7   < > 34.1* 32.8*  37.1 38  MCV 94.1   < > 93.9 92.4 92.8  --   PLT 226   < > 169 156 188 302   < > = values in this interval not displayed.   Lab Results  Component Value Date   TSH 0.865 02/27/2022   Lab Results  Component Value Date   HGBA1C 6.5 11/28/2023   Lab Results  Component Value Date   CHOL 151 06/05/2023   HDL 47 06/05/2023   LDLCALC 77 06/05/2023   TRIG 174 (A) 06/05/2023   CHOLHDL 2.6 04/06/2020    Significant Diagnostic Results in last 30 days:  No results found.  Assessment/Plan Chronic diastolic heart failure (HCC) Chronic diastolic heart failure, DOE,  on and off wheezing, cough, occasional yellow phlegm in am. CXR no evidence of acute infection 10/2022. 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  Diclofenac , MRI completed, Neurosurgery consulted, not a good surgical candidate.                Prediabetes Hgb a1c 6.5 12/21/23,  prediabetic, steroid induced.   Fibromyalgia  RA/fibromyalgia, taking steroids, and Tramadol , TSH 0.865 02/27/22  GERD (gastroesophageal reflux disease) Stable, on Prevacid . Hgb 11.9 05/23/24  Osteopenia OP Rheumatology, off Alendronate , on Vit D, t score -1.8 06/06/21  Gout R ankle, stable, on allopurinol. Uric 6.5 02/05/24  HTN (hypertension) Blood pressure is controlled, on Amlodipine . Bun/creat 12/1.0 05/23/24      Family/ staff Communication: Plan of care reviewed with the patient and charge nurse  Labs/tests ordered: None

## 2024-07-10 NOTE — Assessment & Plan Note (Signed)
 Blood pressure is controlled, on Amlodipine . Bun/creat 12/1.0 05/23/24

## 2024-08-04 ENCOUNTER — Encounter: Payer: Self-pay | Admitting: Orthopedic Surgery

## 2024-08-04 ENCOUNTER — Other Ambulatory Visit: Payer: Self-pay

## 2024-08-04 ENCOUNTER — Non-Acute Institutional Stay: Payer: Self-pay | Admitting: Orthopedic Surgery

## 2024-08-04 ENCOUNTER — Inpatient Hospital Stay (HOSPITAL_COMMUNITY)

## 2024-08-04 ENCOUNTER — Emergency Department (HOSPITAL_COMMUNITY)

## 2024-08-04 ENCOUNTER — Inpatient Hospital Stay (HOSPITAL_COMMUNITY)
Admission: EM | Admit: 2024-08-04 | Discharge: 2024-08-08 | DRG: 871 | Disposition: A | Discharge status: Skilled Nursing Facility | Source: Skilled Nursing Facility | Attending: Internal Medicine | Admitting: Internal Medicine

## 2024-08-04 ENCOUNTER — Encounter (HOSPITAL_COMMUNITY): Payer: Self-pay | Admitting: Emergency Medicine

## 2024-08-04 ENCOUNTER — Telehealth: Payer: Self-pay | Admitting: Orthopedic Surgery

## 2024-08-04 DIAGNOSIS — J9809 Other diseases of bronchus, not elsewhere classified: Secondary | ICD-10-CM | POA: Diagnosis not present

## 2024-08-04 DIAGNOSIS — E871 Hypo-osmolality and hyponatremia: Secondary | ICD-10-CM | POA: Diagnosis present

## 2024-08-04 DIAGNOSIS — M0579 Rheumatoid arthritis with rheumatoid factor of multiple sites without organ or systems involvement: Secondary | ICD-10-CM

## 2024-08-04 DIAGNOSIS — Z66 Do not resuscitate: Secondary | ICD-10-CM | POA: Diagnosis not present

## 2024-08-04 DIAGNOSIS — Z882 Allergy status to sulfonamides status: Secondary | ICD-10-CM

## 2024-08-04 DIAGNOSIS — Z981 Arthrodesis status: Secondary | ICD-10-CM | POA: Diagnosis not present

## 2024-08-04 DIAGNOSIS — R0902 Hypoxemia: Secondary | ICD-10-CM | POA: Diagnosis not present

## 2024-08-04 DIAGNOSIS — Z7952 Long term (current) use of systemic steroids: Secondary | ICD-10-CM | POA: Diagnosis not present

## 2024-08-04 DIAGNOSIS — E785 Hyperlipidemia, unspecified: Secondary | ICD-10-CM | POA: Diagnosis present

## 2024-08-04 DIAGNOSIS — Z87891 Personal history of nicotine dependence: Secondary | ICD-10-CM | POA: Diagnosis not present

## 2024-08-04 DIAGNOSIS — R54 Age-related physical debility: Secondary | ICD-10-CM | POA: Diagnosis present

## 2024-08-04 DIAGNOSIS — R918 Other nonspecific abnormal finding of lung field: Secondary | ICD-10-CM | POA: Diagnosis not present

## 2024-08-04 DIAGNOSIS — R7303 Prediabetes: Secondary | ICD-10-CM

## 2024-08-04 DIAGNOSIS — Z803 Family history of malignant neoplasm of breast: Secondary | ICD-10-CM | POA: Diagnosis not present

## 2024-08-04 DIAGNOSIS — D849 Immunodeficiency, unspecified: Secondary | ICD-10-CM | POA: Diagnosis not present

## 2024-08-04 DIAGNOSIS — R110 Nausea: Secondary | ICD-10-CM | POA: Diagnosis not present

## 2024-08-04 DIAGNOSIS — Z91018 Allergy to other foods: Secondary | ICD-10-CM

## 2024-08-04 DIAGNOSIS — Z886 Allergy status to analgesic agent status: Secondary | ICD-10-CM

## 2024-08-04 DIAGNOSIS — I110 Hypertensive heart disease with heart failure: Secondary | ICD-10-CM | POA: Diagnosis present

## 2024-08-04 DIAGNOSIS — I5032 Chronic diastolic (congestive) heart failure: Secondary | ICD-10-CM

## 2024-08-04 DIAGNOSIS — J9600 Acute respiratory failure, unspecified whether with hypoxia or hypercapnia: Secondary | ICD-10-CM | POA: Diagnosis not present

## 2024-08-04 DIAGNOSIS — D638 Anemia in other chronic diseases classified elsewhere: Secondary | ICD-10-CM | POA: Diagnosis present

## 2024-08-04 DIAGNOSIS — A419 Sepsis, unspecified organism: Secondary | ICD-10-CM | POA: Diagnosis not present

## 2024-08-04 DIAGNOSIS — E782 Mixed hyperlipidemia: Secondary | ICD-10-CM | POA: Diagnosis not present

## 2024-08-04 DIAGNOSIS — J309 Allergic rhinitis, unspecified: Secondary | ICD-10-CM | POA: Diagnosis not present

## 2024-08-04 DIAGNOSIS — R5381 Other malaise: Secondary | ICD-10-CM | POA: Diagnosis not present

## 2024-08-04 DIAGNOSIS — J9601 Acute respiratory failure with hypoxia: Secondary | ICD-10-CM | POA: Diagnosis present

## 2024-08-04 DIAGNOSIS — E860 Dehydration: Secondary | ICD-10-CM | POA: Diagnosis present

## 2024-08-04 DIAGNOSIS — Z880 Allergy status to penicillin: Secondary | ICD-10-CM

## 2024-08-04 DIAGNOSIS — R509 Fever, unspecified: Secondary | ICD-10-CM

## 2024-08-04 DIAGNOSIS — J45909 Unspecified asthma, uncomplicated: Secondary | ICD-10-CM

## 2024-08-04 DIAGNOSIS — Z9842 Cataract extraction status, left eye: Secondary | ICD-10-CM

## 2024-08-04 DIAGNOSIS — R6520 Severe sepsis without septic shock: Secondary | ICD-10-CM | POA: Diagnosis present

## 2024-08-04 DIAGNOSIS — Z888 Allergy status to other drugs, medicaments and biological substances status: Secondary | ICD-10-CM

## 2024-08-04 DIAGNOSIS — N179 Acute kidney failure, unspecified: Secondary | ICD-10-CM | POA: Diagnosis present

## 2024-08-04 DIAGNOSIS — D72829 Elevated white blood cell count, unspecified: Secondary | ICD-10-CM | POA: Diagnosis not present

## 2024-08-04 DIAGNOSIS — J159 Unspecified bacterial pneumonia: Secondary | ICD-10-CM | POA: Diagnosis not present

## 2024-08-04 DIAGNOSIS — J449 Chronic obstructive pulmonary disease, unspecified: Secondary | ICD-10-CM | POA: Diagnosis present

## 2024-08-04 DIAGNOSIS — M797 Fibromyalgia: Secondary | ICD-10-CM | POA: Diagnosis present

## 2024-08-04 DIAGNOSIS — Z833 Family history of diabetes mellitus: Secondary | ICD-10-CM

## 2024-08-04 DIAGNOSIS — Z8249 Family history of ischemic heart disease and other diseases of the circulatory system: Secondary | ICD-10-CM

## 2024-08-04 DIAGNOSIS — M069 Rheumatoid arthritis, unspecified: Secondary | ICD-10-CM | POA: Diagnosis not present

## 2024-08-04 DIAGNOSIS — Z7401 Bed confinement status: Secondary | ICD-10-CM | POA: Diagnosis not present

## 2024-08-04 DIAGNOSIS — I700 Atherosclerosis of aorta: Secondary | ICD-10-CM | POA: Diagnosis not present

## 2024-08-04 DIAGNOSIS — Z91013 Allergy to seafood: Secondary | ICD-10-CM

## 2024-08-04 DIAGNOSIS — Z79899 Other long term (current) drug therapy: Secondary | ICD-10-CM

## 2024-08-04 DIAGNOSIS — J441 Chronic obstructive pulmonary disease with (acute) exacerbation: Secondary | ICD-10-CM | POA: Diagnosis not present

## 2024-08-04 DIAGNOSIS — Z9841 Cataract extraction status, right eye: Secondary | ICD-10-CM

## 2024-08-04 DIAGNOSIS — R0602 Shortness of breath: Secondary | ICD-10-CM | POA: Diagnosis not present

## 2024-08-04 DIAGNOSIS — J189 Pneumonia, unspecified organism: Secondary | ICD-10-CM | POA: Diagnosis present

## 2024-08-04 DIAGNOSIS — G8929 Other chronic pain: Secondary | ICD-10-CM

## 2024-08-04 DIAGNOSIS — J440 Chronic obstructive pulmonary disease with acute lower respiratory infection: Secondary | ICD-10-CM | POA: Diagnosis present

## 2024-08-04 DIAGNOSIS — R001 Bradycardia, unspecified: Secondary | ICD-10-CM | POA: Diagnosis present

## 2024-08-04 DIAGNOSIS — T380X5A Adverse effect of glucocorticoids and synthetic analogues, initial encounter: Secondary | ICD-10-CM | POA: Diagnosis present

## 2024-08-04 DIAGNOSIS — R531 Weakness: Secondary | ICD-10-CM | POA: Diagnosis not present

## 2024-08-04 DIAGNOSIS — J168 Pneumonia due to other specified infectious organisms: Secondary | ICD-10-CM | POA: Diagnosis not present

## 2024-08-04 DIAGNOSIS — I10 Essential (primary) hypertension: Secondary | ICD-10-CM | POA: Diagnosis present

## 2024-08-04 DIAGNOSIS — Z96619 Presence of unspecified artificial shoulder joint: Secondary | ICD-10-CM | POA: Diagnosis present

## 2024-08-04 HISTORY — DX: Chronic obstructive pulmonary disease, unspecified: J44.9

## 2024-08-04 LAB — CK: Total CK: 69 U/L (ref 38–234)

## 2024-08-04 LAB — TSH: TSH: 1.777 u[IU]/mL (ref 0.350–4.500)

## 2024-08-04 LAB — CBC WITH DIFFERENTIAL/PLATELET
Abs Immature Granulocytes: 0.13 K/uL — ABNORMAL HIGH (ref 0.00–0.07)
Basophils Absolute: 0.1 K/uL (ref 0.0–0.1)
Basophils Relative: 0 %
Eosinophils Absolute: 0.1 K/uL (ref 0.0–0.5)
Eosinophils Relative: 0 %
HCT: 37.6 % (ref 36.0–46.0)
Hemoglobin: 11.6 g/dL — ABNORMAL LOW (ref 12.0–15.0)
Immature Granulocytes: 1 %
Lymphocytes Relative: 10 %
Lymphs Abs: 2.1 K/uL (ref 0.7–4.0)
MCH: 28.6 pg (ref 26.0–34.0)
MCHC: 30.9 g/dL (ref 30.0–36.0)
MCV: 92.8 fL (ref 80.0–100.0)
Monocytes Absolute: 2.2 K/uL — ABNORMAL HIGH (ref 0.1–1.0)
Monocytes Relative: 11 %
Neutro Abs: 15.8 K/uL — ABNORMAL HIGH (ref 1.7–7.7)
Neutrophils Relative %: 78 %
Platelets: 257 K/uL (ref 150–400)
RBC: 4.05 MIL/uL (ref 3.87–5.11)
RDW: 16.4 % — ABNORMAL HIGH (ref 11.5–15.5)
WBC: 20.3 K/uL — ABNORMAL HIGH (ref 4.0–10.5)
nRBC: 0 % (ref 0.0–0.2)

## 2024-08-04 LAB — CBC AND DIFFERENTIAL
HCT: 38 (ref 36–46)
Hemoglobin: 12.3 (ref 12.0–16.0)
Neutrophils Absolute: 15711
Platelets: 296 K/uL (ref 150–400)
WBC: 20.7

## 2024-08-04 LAB — COMPREHENSIVE METABOLIC PANEL WITH GFR
ALT: 17 U/L (ref 0–44)
AST: 25 U/L (ref 15–41)
Albumin: 3.2 g/dL — ABNORMAL LOW (ref 3.5–5.0)
Alkaline Phosphatase: 89 U/L (ref 38–126)
Anion gap: 10 (ref 5–15)
BUN: 18 mg/dL (ref 8–23)
CO2: 23 mmol/L (ref 22–32)
Calcium: 8.7 mg/dL — ABNORMAL LOW (ref 8.9–10.3)
Calcium: 9.8 (ref 8.7–10.7)
Chloride: 98 mmol/L (ref 98–111)
Creatinine, Ser: 1.49 mg/dL — ABNORMAL HIGH (ref 0.44–1.00)
GFR, Estimated: 33 mL/min — ABNORMAL LOW (ref >60)
Glucose, Bld: 137 mg/dL — ABNORMAL HIGH (ref 70–99)
Potassium: 4 mmol/L (ref 3.5–5.1)
Sodium: 131 mmol/L — ABNORMAL LOW (ref 135–145)
Total Bilirubin: 1.2 mg/dL (ref 0.0–1.2)
Total Protein: 7.2 g/dL (ref 6.5–8.1)
eGFR: 38

## 2024-08-04 LAB — RESP PANEL BY RT-PCR (RSV, FLU A&B, COVID)  RVPGX2
Influenza A by PCR: NEGATIVE
Influenza B by PCR: NEGATIVE
Resp Syncytial Virus by PCR: NEGATIVE
SARS Coronavirus 2 by RT PCR: NEGATIVE

## 2024-08-04 LAB — PROTIME-INR
INR: 1.2 (ref 0.8–1.2)
Prothrombin Time: 16.2 s — ABNORMAL HIGH (ref 11.4–15.2)

## 2024-08-04 LAB — BRAIN NATRIURETIC PEPTIDE: B Natriuretic Peptide: 102 pg/mL — ABNORMAL HIGH (ref 0.0–100.0)

## 2024-08-04 LAB — BASIC METABOLIC PANEL WITH GFR
BUN: 15 (ref 4–21)
CO2: 22 (ref 13–22)
Chloride: 101 (ref 99–108)
Creatinine: 1.4 — AB (ref 0.5–1.1)
Glucose: 137
Potassium: 3.9 meq/L (ref 3.5–5.1)
Sodium: 135 — AB (ref 137–147)

## 2024-08-04 LAB — BLOOD GAS, VENOUS
Acid-Base Excess: 2.7 mmol/L — ABNORMAL HIGH (ref 0.0–2.0)
Bicarbonate: 28.9 mmol/L — ABNORMAL HIGH (ref 20.0–28.0)
O2 Saturation: 77.6 %
Patient temperature: 37
pCO2, Ven: 50 mmHg (ref 44–60)
pH, Ven: 7.37 (ref 7.25–7.43)
pO2, Ven: 47 mmHg — ABNORMAL HIGH (ref 32–45)

## 2024-08-04 LAB — SODIUM, URINE, RANDOM: Sodium, Ur: 26 mmol/L

## 2024-08-04 LAB — URINALYSIS, W/ REFLEX TO CULTURE (INFECTION SUSPECTED)
Bacteria, UA: NONE SEEN
Bilirubin Urine: NEGATIVE
Glucose, UA: NEGATIVE mg/dL
Ketones, ur: NEGATIVE mg/dL
Leukocytes,Ua: NEGATIVE
Nitrite: NEGATIVE
Protein, ur: 30 mg/dL — AB
Specific Gravity, Urine: 1.016 (ref 1.005–1.030)
pH: 5 (ref 5.0–8.0)

## 2024-08-04 LAB — I-STAT CG4 LACTIC ACID, ED
Lactic Acid, Venous: 1.9 mmol/L (ref 0.5–1.9)
Lactic Acid, Venous: 1.5 mmol/L (ref 0.5–1.9)

## 2024-08-04 LAB — MAGNESIUM: Magnesium: 2.1 mg/dL (ref 1.7–2.4)

## 2024-08-04 LAB — CORTISOL: Cortisol, Plasma: 16.1 ug/dL

## 2024-08-04 LAB — STREP PNEUMONIAE URINARY ANTIGEN: Strep Pneumo Urinary Antigen: NEGATIVE

## 2024-08-04 LAB — CREATININE, URINE, RANDOM: Creatinine, Urine: 152 mg/dL

## 2024-08-04 LAB — CBC: RBC: 4.23 (ref 3.87–5.11)

## 2024-08-04 MED ORDER — VANCOMYCIN HCL 1250 MG/250ML IV SOLN: 1250.0000 mg | INTRAVENOUS | Status: DC

## 2024-08-04 MED ORDER — LACTATED RINGERS IV BOLUS (SEPSIS)
500.0000 mL | Freq: Once | INTRAVENOUS | Status: AC
  Administered 2024-08-04: 500 mL via INTRAVENOUS

## 2024-08-04 MED ORDER — METHYLPREDNISOLONE SODIUM SUCC 125 MG IJ SOLR
80.0000 mg | Freq: Two times a day (BID) | INTRAMUSCULAR | Status: DC
  Administered 2024-08-04 – 2024-08-05 (×3): 80 mg via INTRAVENOUS
  Filled 2024-08-04 (×3): qty 2

## 2024-08-04 MED ORDER — SODIUM CHLORIDE (PF) 0.9 % IJ SOLN
INTRAMUSCULAR | Status: AC
  Filled 2024-08-04: qty 50

## 2024-08-04 MED ORDER — TRAMADOL HCL 50 MG PO TABS
25.0000 mg | ORAL_TABLET | Freq: Once | ORAL | Status: AC
  Administered 2024-08-05: 25 mg via ORAL
  Filled 2024-08-04: qty 1

## 2024-08-04 MED ORDER — MONTELUKAST SODIUM 10 MG PO TABS
10.0000 mg | ORAL_TABLET | Freq: Every day | ORAL | Status: DC
  Administered 2024-08-05 – 2024-08-07 (×3): 10 mg via ORAL
  Filled 2024-08-04 (×3): qty 1

## 2024-08-04 MED ORDER — ALBUTEROL SULFATE (2.5 MG/3ML) 0.083% IN NEBU: 2.5000 mg | INHALATION_SOLUTION | RESPIRATORY_TRACT | Status: DC | PRN

## 2024-08-04 MED ORDER — BENZONATATE 100 MG PO CAPS
200.0000 mg | ORAL_CAPSULE | Freq: Three times a day (TID) | ORAL | Status: DC | PRN
  Administered 2024-08-06 – 2024-08-07 (×2): 200 mg via ORAL
  Filled 2024-08-04 (×2): qty 2

## 2024-08-04 MED ORDER — IPRATROPIUM-ALBUTEROL 0.5-2.5 (3) MG/3ML IN SOLN
3.0000 mL | Freq: Four times a day (QID) | RESPIRATORY_TRACT | Status: DC
  Administered 2024-08-04 – 2024-08-05 (×2): 3 mL via RESPIRATORY_TRACT
  Filled 2024-08-04 (×2): qty 3

## 2024-08-04 MED ORDER — ONDANSETRON HCL 4 MG/2ML IJ SOLN: 4.0000 mg | Freq: Four times a day (QID) | INTRAMUSCULAR | Status: DC | PRN

## 2024-08-04 MED ORDER — SODIUM CHLORIDE 0.9 % IV SOLN: 1.0000 g | INTRAVENOUS | Status: DC

## 2024-08-04 MED ORDER — LORATADINE 10 MG PO TABS
10.0000 mg | ORAL_TABLET | Freq: Every day | ORAL | Status: DC
  Administered 2024-08-05 – 2024-08-08 (×4): 10 mg via ORAL
  Filled 2024-08-04 (×4): qty 1

## 2024-08-04 MED ORDER — SODIUM CHLORIDE 0.9 % IV SOLN
500.0000 mg | Freq: Once | INTRAVENOUS | Status: AC
  Administered 2024-08-04: 500 mg via INTRAVENOUS
  Filled 2024-08-04: qty 5

## 2024-08-04 MED ORDER — MELATONIN 3 MG PO TABS
3.0000 mg | ORAL_TABLET | Freq: Every evening | ORAL | Status: DC | PRN
  Administered 2024-08-06: 3 mg via ORAL
  Filled 2024-08-04: qty 1

## 2024-08-04 MED ORDER — LACTATED RINGERS IV BOLUS
500.0000 mL | Freq: Once | INTRAVENOUS | Status: AC
  Administered 2024-08-04: 500 mL via INTRAVENOUS

## 2024-08-04 MED ORDER — SODIUM CHLORIDE 0.9 % IV SOLN
2.0000 g | Freq: Once | INTRAVENOUS | Status: AC
  Administered 2024-08-04: 2 g via INTRAVENOUS
  Filled 2024-08-04: qty 20

## 2024-08-04 MED ORDER — ACETAMINOPHEN 325 MG PO TABS
650.0000 mg | ORAL_TABLET | Freq: Four times a day (QID) | ORAL | Status: DC | PRN
  Administered 2024-08-06: 650 mg via ORAL
  Filled 2024-08-04: qty 2

## 2024-08-04 MED ORDER — SODIUM CHLORIDE 0.9 % IV SOLN
2.0000 g | INTRAVENOUS | Status: DC
  Administered 2024-08-04: 2 g via INTRAVENOUS
  Filled 2024-08-04: qty 12.5

## 2024-08-04 MED ORDER — GUAIFENESIN ER 600 MG PO TB12
600.0000 mg | ORAL_TABLET | Freq: Two times a day (BID) | ORAL | Status: DC
  Administered 2024-08-04 – 2024-08-08 (×8): 600 mg via ORAL
  Filled 2024-08-04 (×8): qty 1

## 2024-08-04 MED ORDER — LACTATED RINGERS IV BOLUS (SEPSIS)
1000.0000 mL | Freq: Once | INTRAVENOUS | Status: AC
  Administered 2024-08-04: 1000 mL via INTRAVENOUS

## 2024-08-04 MED ORDER — ATORVASTATIN CALCIUM 10 MG PO TABS
20.0000 mg | ORAL_TABLET | Freq: Every day | ORAL | Status: DC
  Administered 2024-08-05 – 2024-08-07 (×3): 20 mg via ORAL
  Filled 2024-08-04 (×3): qty 1

## 2024-08-04 MED ORDER — LACTATED RINGERS IV SOLN: INTRAVENOUS | Status: AC

## 2024-08-04 MED ORDER — IOHEXOL 350 MG/ML SOLN
60.0000 mL | Freq: Once | INTRAVENOUS | Status: AC | PRN
  Administered 2024-08-05: 60 mL via INTRAVENOUS

## 2024-08-04 MED ORDER — SODIUM CHLORIDE 0.9 % IV SOLN
500.0000 mg | INTRAVENOUS | Status: DC
  Administered 2024-08-05 – 2024-08-06 (×2): 500 mg via INTRAVENOUS
  Filled 2024-08-04 (×2): qty 5

## 2024-08-04 MED ORDER — VANCOMYCIN HCL 1500 MG/300ML IV SOLN
1500.0000 mg | Freq: Once | INTRAVENOUS | Status: AC
  Administered 2024-08-04: 1500 mg via INTRAVENOUS
  Filled 2024-08-04: qty 300

## 2024-08-04 MED ORDER — ACETAMINOPHEN 650 MG RE SUPP: 650.0000 mg | Freq: Four times a day (QID) | RECTAL | Status: DC | PRN

## 2024-08-04 MED ORDER — ACETAMINOPHEN 325 MG PO TABS
650.0000 mg | ORAL_TABLET | Freq: Once | ORAL | Status: AC
  Administered 2024-08-04: 650 mg via ORAL
  Filled 2024-08-04: qty 2

## 2024-08-04 NOTE — H&P (Signed)
 History and Physical      Eileen Wilkerson FMW:[id number] DOB: 1932-05-22 DOA: 08/04/2024; DOS: 08/04/2024  PCP: Mast, [name], NP  Patient coming from: home   I have personally briefly reviewed patient's old medical records in [facility]  Chief Complaint: Cough  HPI: Eileen Wilkerson is a 89 y.o. female with medical history significant for COPD/asthma, chronic diastolic heart failure, rheumatoid arthritis on chronic prednisone  therapy, essential hypertension, hyperlipidemia, anemia of chronic disease associated baseline hemoglobin of 10-12, who is admitted to [facility] on 08/04/2024 with acute hypoxic respiratory failure in the setting of acute COPD exacerbation after presenting from home to [facility] ED complaining of cough.   The patient reports 1 day of new onset productive cough associated with shortness of breath and objective fever.  Generalized myalgias in the absence of will by rigors.  Her shortness of breath is not associate with any chest pain, palpitations, diaphoresis, orthopnea, PND, or worsening peripheral edema.  However, she has noted new onset mild wheezing over the last day.  No recent nausea, vomiting, diarrhea, abdominal pain, or neck stiffness.  He also denies any recent dysuria or gross hematuria.  She has a history of COPD, with documentation of a history of significant bronchodilator response.  Former smoker.  No known baseline supplemental oxygen requirements she has a history of chronic diastolic heart failure, with most recent echocardiogram on 12/21/2023, which showed LVEF 55 to 60%, no evidence of focal wall motion or maladies, grade 1 diastolic dysfunction, normal right ventricular systolic function and no evidence of significant valvular pathology.  She is on daily torsemide at home.  Her medical history is also notable for documentation of rheumatoid arthritis for which she is on chronic prednisone  therapy.  She was hospitalized in January 2025 with  acute hypoxic respiratory failure in the setting of suspected acute respiratory infection with presentation complicated by suspected adrenal insufficiency.   In the setting of the above symptoms, this was contacted and noted patient's initial oxygen saturation to be in the mid to high 80s on room air, prompting initiation 2 L nasal cannula and subsequent bring the patient to [facility] emergency department for further evaluation and management thereof.   ED Course:  Vital signs in the ED were notable for the following: Temperature max one 1.2; heart rates in the 80s to 100s; systolic pressures in the high 90s to 120s with most recent blood pressure 111/71; respiratory rate 18-20, after EMS had noted initial oxygen saturation to be in the range 86 to 80% on room air, she was started on 2 L nasal cannula; in the ED, her initial oxygen saturations were noted to be 90 to 92% on 3 L nasal cannula, prompting increase in flow to 4 L nasal cannula, upon which the patient has been maintained oxygen saturation's in the mid to high 90s, most recently 96% on 4 L nasal cannula.  Labs were notable for the following: CMP was notable for the following: Sodium 131 compared to most recent prior value 139 on 05/23/2024, potassium 4.0, bicarbonate 23, anion gap 10, creatinine 1.49 compared to 1.0 on 05/23/2024, calcium  adjusted for mild hypoalbuminemia noted to be 9.3, albumin  3.2.  Otherwise, liver enzymes were within normal limits.  Initial lactate 1.9.  CBC notable for white blood cell count 20,300 with 78% neutrophils, hemoglobin 11.6 compared to most recent prior value of 11.9 on 05/23/2024, platelet count 257.  INR 1.2.  Urinalysis has been ordered, with result currently pending.  Blood cultures x 2 were collected prior to initiation of IV antibiotics.  COVID, influenza, RSV PCR were all negative.  Per my interpretation, EKG in ED demonstrated the following: Sinus rhythm with borderline prolongation of PR interval at  215 suggestive of first-degree AV block, otherwise, normal intervals; heart rate 90, and no evidence of T wave or ST changes, including no evidence of ST elevation.  Imaging in the ED, per corresponding formal radiology read, was notable for the following: 1 view chest x-ray showed no evidence of acute cardiopulmonary process, including no evidence of infiltrate, edema, effusion, or pneumothorax.  While in the ED, the following were administered: Acetaminophen  650 mg p.o. x 1 dose, azithromycin , Rocephin , lactated Ringer 's x 2.5 L bolus followed by initiation continuous LR running at 150 cc/h.  Subsequently, the patient was admitted for further evaluation management of presenting acute hypoxic respiratory failure in the setting of acute COPD exacerbation as well as suspected severe sepsis due to community-acquired pneumonia, with presenting labs also notable for acute kidney injury as well as acute hyponatremia.     Review of Systems: As per HPI otherwise 10 point review of systems negative.   Past Medical History:  Diagnosis Date   Adrenal failure (HCC)    Arthritis    Asthma    Cancer (HCC)    Cataract    Closed nondisplaced fracture of fifth right metatarsal bone 09/18/2017   COPD (chronic obstructive pulmonary disease) (HCC)    Diverticulitis    Per patient   E coli bacteremia    Fibromyalgia 2008   GERD (gastroesophageal reflux disease) 12/18/2016   HCAP (healthcare-associated pneumonia) 02/03/2018   Hyperlipidemia 10/13/2021   Osteoporosis    RA (rheumatoid arthritis) (HCC)    Recurrent upper respiratory infection (URI)    Sepsis due to urinary tract infection (HCC) 01/18/2018   Urticaria     Past Surgical History:  Procedure Laterality Date   BACK SURGERY     BILATERAL CARPAL TUNNEL RELEASE  2005   right and left   CATARACT EXTRACTION, BILATERAL  2004   right and left   CERVICAL FUSION  2011,2010,2008   2 disks   HEEL SPUR SURGERY  2004   lower back fusion  2011    Fusion of 3-4 and 4-5 lower back   RADIOFREQUENCY ABLATION  2020   ROTATOR CUFF REPAIR  [id number]   SQUAMOUS CELL CARCINOMA EXCISION     TONSILLECTOMY AND ADENOIDECTOMY  1947   TOTAL SHOULDER ARTHROPLASTY      Social History:  reports that she quit smoking about 57 years ago. Her smoking use included cigarettes. She started smoking about 67 years ago. She has a 7.5 pack-year smoking history. She has never been exposed to tobacco smoke. She has never used smokeless tobacco. She reports that she does not drink alcohol and does not use drugs.   Allergies  Allergen Reactions   Adhesive [Tape] Rash   Celebrex [Celecoxib] Hives   Ciprofibrate Nausea Only   Cymbalta [Duloxetine Hcl] Swelling   Gabitril [Tiagabine] Swelling   Lyrica [Pregabalin] Swelling   Neurontin [Gabapentin] Swelling   Nexium  [Esomeprazole ] Rash   Nsaids Rash   Shrimp [Shellfish Allergy] Anaphylaxis    Per patient shrimp only   Azactam  [Aztreonam ]     Hand swelling    Azelastine  Hcl     Rash    Ciprofloxacin  Other (See Comments)    dizziness   Claritin  [Loratadine ]     Irritability Nervousness    Methotrexate  Derivatives  Nasacort  [Triamcinolone ]     Dizzy    Olopatadine  Other (See Comments)    Pain and lethargy    Other    Sulfamethizole Other (See Comments)    unknown   Zantac [Ranitidine Hcl] Other (See Comments)    unknown   Claritin -D 12 Hour [Loratadine -Pseudoephedrine Er] Anxiety   Keflex  [Cephalexin ] Nausea And Vomiting    Tolerated Ancef    Penicillins Rash    Injection site reaction. Tolerated cefepime  in past. Also reports tolerating a penicillin infusion after this initial rxn ~20 yrs ago.  Has patient had a PCN reaction causing immediate rash, facial/tongue/throat swelling, SOB or lightheadedness with hypotension: No Has patient had a PCN reaction causing severe rash involving mucus membranes or skin necrosis: No Has patient had a PCN reaction that required hospitalization No Has  patient had a PCN reaction occurring within the last 10 years: No     Sulfa Antibiotics Nausea And Vomiting    Family History  Problem Relation Age of Onset   Heart attack Maternal Grandmother    Heart attack Paternal Grandfather    Breast cancer Mother 24   Diabetes Father    Heart disease Father    Congestive Heart Failure Father 22       Died from   Allergic rhinitis Neg Hx    Asthma Neg Hx    Eczema Neg Hx    Urticaria Neg Hx     Family history reviewed and not pertinent    Prior to Admission medications   Medication Sig Start Date End Date Taking? Authorizing Provider  acetaminophen  (TYLENOL ) 325 MG tablet Take 650 mg by mouth 3 (three) times daily.   Yes [provider]  allopurinol (ZYLOPRIM) 100 MG tablet Take 100 mg by mouth daily. 01/08/24  Yes [provider]  amLODipine  (NORVASC ) 5 MG tablet Take 5 mg by mouth daily.   Yes [provider]  atorvastatin  (LIPITOR) 20 MG tablet TAKE 1 TABLET BY MOUTH EVERY DAY 03/26/23  Yes Mast, [name], NP  Biotin  5 MG TBDP Take 1 tablet (5 mg total) by mouth daily. 06/20/22  Yes Medina-Vargas, Monina C, NP  cholecalciferol  (VITAMIN D3) 25 MCG (1000 UNIT) tablet Take 1,000 Units by mouth daily.   Yes [provider]  cycloSPORINE  (RESTASIS ) 0.05 % ophthalmic emulsion Place 1 drop into both eyes 2 (two) times daily. 06/20/22  Yes Medina-Vargas, Monina C, NP  D-Mannose 500 MG CAPS Take 500 mg by mouth daily.   Yes [provider]  diclofenac  Sodium (VOLTAREN ) 1 % GEL APPLY 2 TO 4 GRAMS TOPICALLY TO AFFECTED JOINTS UP TO 4 TIMES DAILY 06/20/22  Yes Medina-Vargas, Monina C, NP  docusate sodium  (COLACE) 100 MG capsule Take 1 capsule (100 mg total) by mouth 2 (two) times daily. 03/11/24  Yes Medina-Vargas, Monina C, NP  fexofenadine  (ALLEGRA ) 60 MG tablet Take 60 mg by mouth daily.   Yes [provider]  ipratropium-albuterol  (DUONEB) 0.5-2.5 (3) MG/3ML SOLN Take 3 mLs by nebulization as needed.    Yes [provider]  lansoprazole  (PREVACID ) 30 MG capsule TAKE 1 CAPSULE BY MOUTH ONCE DAILY AT NOON Patient taking differently: Take 30 mg by mouth daily. 10/10/22  Yes Mast, [name], NP  lidocaine  4 % Place 1 patch onto the skin daily. Apply to right ankle   Yes [provider]  montelukast  (SINGULAIR ) 10 MG tablet TAKE 1 TABLET BY MOUTH EVERYDAY AT BEDTIME Patient taking differently: Take 10 mg by mouth at bedtime. 08/28/22  Yes Mast, [name], NP  ondansetron  (ZOFRAN ) 4 MG tablet Take 4 mg by mouth as needed for nausea or vomiting.   Yes [provider]  potassium chloride  SA (KLOR-CON  M) 20 MEQ tablet Take 40 mEq by mouth 2 (two) times daily.   Yes [provider]  predniSONE  (DELTASONE ) 5 MG tablet Take 5 mg by mouth daily at 12 noon.   Yes [provider]  saccharomyces boulardii (FLORASTOR) 250 MG capsule Take 250 mg by mouth 2 (two) times daily.   Yes [provider]  Torsemide 40 MG TABS Take 40 mg by mouth daily at 12 noon. 09/04/23  Yes [provider]  traMADol  (ULTRAM ) 50 MG tablet Take 50 mg by mouth 2 (two) times daily.   Yes [provider]  traMADol  (ULTRAM ) 50 MG tablet Take 1 tablet (50 mg total) by mouth every 6 (six) hours as needed (pain). 06/18/24  Yes Gil Greig BRAVO, NP     Objective    Physical Exam: Vitals:   08/04/24 1900 08/04/24 1915 08/04/24 1930 08/04/24 2000  BP: (!) 112/47  111/71 104/87  Pulse:  81  73  Resp: 19 18 20 16   Temp:      TempSrc:      SpO2:  96%  98%    General: appears to be stated age; sleepy, but easily arouses to verbal stimuli; Skin: warm, dry, no rash Head:  AT/[place] Mouth:  Oral mucosa membranes appear dry, normal dentition Neck: supple; trachea midline Heart:  RRR; did not appreciate any M/R/G Lungs: Bilateral expiratory wheezes noted; did not appreciate rales, or rhonchi Abdomen: + BS; soft, ND, NT Vascular: 2+ pedal pulses b/l; 2+ radial pulses b/l Extremities: no  peripheral edema, no muscle wasting   Labs on Admission: I have personally reviewed following labs and imaging studies  CBC: Recent Labs  Lab 08/04/24 1854  WBC 20.3*  NEUTROABS 15.8*  HGB 11.6*  HCT 37.6  MCV 92.8  PLT 257   Basic Metabolic Panel: Recent Labs  Lab 08/04/24 1854  NA 131*  K 4.0  CL 98  CO2 23  GLUCOSE 137*  BUN 18  CREATININE 1.49*  CALCIUM  8.7*  MG 2.1   GFR: Estimated Creatinine Clearance: 25.9 mL/min (A) (by C-G formula based on SCr of 1.49 mg/dL (H)). Liver Function Tests: Recent Labs  Lab 08/04/24 1854  AST 25  ALT 17  ALKPHOS 89  BILITOT 1.2  PROT 7.2  ALBUMIN  3.2*   No results for input(s): LIPASE, AMYLASE in the last 168 hours. No results for input(s): AMMONIA in the last 168 hours. Coagulation Profile: Recent Labs  Lab 08/04/24 1854  INR 1.2   Cardiac Enzymes: No results for input(s): CKTOTAL, CKMB, CKMBINDEX, TROPONINI in the last 168 hours. BNP (last 3 results) No results for input(s): PROBNP in the last 8760 hours. HbA1C: No results for input(s): HGBA1C in the last 72 hours. CBG: No results for input(s): GLUCAP in the last 168 hours. Lipid Profile: No results for input(s): CHOL, HDL, LDLCALC, TRIG, CHOLHDL, LDLDIRECT in the last 72 hours. Thyroid  Function Tests: No results for input(s): TSH, T4TOTAL, FREET4, T3FREE, THYROIDAB in the last 72 hours. Anemia Panel: No results for input(s): VITAMINB12, FOLATE, FERRITIN, TIBC, IRON, RETICCTPCT in the last 72 hours. Urine analysis:    Component Value Date/Time   COLORURINE YELLOW 12/21/2023 1230   APPEARANCEUR CLEAR 12/21/2023 1230   LABSPEC 1.027 12/21/2023 1230   PHURINE 6.0 12/21/2023 1230   GLUCOSEU NEGATIVE 12/21/2023  1230   GLUCOSEU NEGATIVE 02/13/2018 1036   HGBUR MODERATE (A) 12/21/2023 1230   BILIRUBINUR NEGATIVE 12/21/2023 1230   BILIRUBINUR negative 01/31/2023 1333   BILIRUBINUR Negative 03/28/2021 1611    KETONESUR NEGATIVE 12/21/2023 1230   PROTEINUR 100 (A) 12/21/2023 1230   UROBILINOGEN 0.2 01/31/2023 1333   UROBILINOGEN 0.2 02/13/2018 1036   NITRITE NEGATIVE 12/21/2023 1230   LEUKOCYTESUR NEGATIVE 12/21/2023 1230    Radiological Exams on Admission: DG Chest Port 1 View Result Date: 08/04/2024 CLINICAL DATA:  Questionable sepsis - evaluate for abnormality EXAM: PORTABLE CHEST 1 VIEW COMPARISON:  Chest x-ray 12/21/2023 FINDINGS: The heart and mediastinal contours are unchanged. Atherosclerotic plaque. No focal consolidation. No pulmonary edema. No pleural effusion. No pneumothorax. No acute osseous abnormality.  Cervical spine surgical hardware. IMPRESSION: 1. No active disease. 2.  Aortic Atherosclerosis (ICD10-I70.0). Electronically Signed   By: Morgane  Naveau M.D.   On: 08/04/2024 19:55      Assessment/Plan    Principal Problem:   Acute hypoxic respiratory failure (HCC) Active Problems:   Leukocytosis   Fever   Rheumatoid arthritis (HCC)   CAP (community acquired pneumonia)   Severe sepsis (HCC)   Allergic rhinitis   HLD (hyperlipidemia)   Chronic diastolic CHF (congestive heart failure) (HCC)   Essential hypertension   COPD with acute exacerbation (HCC)   AKI (acute kidney injury) (HCC)   Acute hyponatremia   Anemia of chronic disease     #) Acute hypoxic respiratory failure due to acute copd exacerbation: in the context of acute respiratory symptoms in the form of 1 day of sob, productive cough, new onset wheezing, and no known baseline supplemental O2 requirements, presenting O2 sat note to be 86 to 88% on room air, subsequently improving into the mid to high 90s on 4 L nasal cannula.  Suspect that her acute hypoxic respiratory failure may be multifactorial in etiology, with suspected contribution from acute COPD exacerbation given a documented history of such, with documentation of a history of significant bronchodilator response. Particularly given presence of  wheezing without clinical or radiographic evidence of volume overload, will initiate systemic corticosteroids at this time, as further detailed below. There is also concern for underlying acute respiratory infection potentially driving her acute COPD exacerbation, however, presenting chest x-ray showed no evidence of acute cardiopulmonary process, including no evidence of infiltrate.  However, given clinical evidence of mild dehydration, the possibility of false radiographic negative on initial chest x-ray exists.  Will check procalcitonin level and pursue CTA chest to further evaluate.  Of note, COVID, influenza, RSV were all negative.  No evidence on chest x-ray of pleural effusion or pneumothorax.  Of note, she has a history of rheumatoid arthritis for which she is on chronic prednisone  therapy.  Documentation of a history of corresponding interstitial lung disease on the basis of the former.  She also has a history of chronic diastolic heart failure, although no clinical or radiographic evidence to suggest acutely decompensated heart failure at this time.  However, we will check and trend BNP as well as closely monitor considering volume status as outlined below.  In terms of other considered etiologies, ACS appears less likely at this time in the absence of any recent CP and in the context of presenting EKG showing no e/o acute ischemic process.  Presenting shortness of breath, tachycardia, fever, differential also includes the possibility of acute pulmonary embolism, which will be further evaluated via aforementioned and CTA of the chest.  Plan: Solu-Medrol  80 mg IV  twice daily.  Scheduled duo nebulizer treatments.  As needed albuterol  nebulizers . monitor on telemetry.  Repeat CMP/CBC in the AM. Check serum Mg and Phos levels. Check blood gas. Flutter valve, incentive spirometry.  Scheduled guaifenesin , as needed Tessalon  Perles.  CTA chest.  Add on procalcitonin level.  Check BNP.  Further evaluation  management of suspected community-acquired pneumonia, including IV antibiotics, as further detailed below.                     #) Severe sepsis due to suspected community-acquired pneumonia: Suspicion for bacterial pneumonia on the basis of presenting 1 day of new onset productive cough, associated shortness of breath, objective fever, with leukocytosis associated with neutrophilic predominance in this patient who is chronically immunocompromise in the setting of her chronic prednisone  therapy.  However, initial chest x-ray shows no evidence of acute cardiopulmonary process, including no evidence of infiltrate.  Given evidence of mild clinical dehydration, the possibility exists of potential initial false radiographic negative for underlying pneumonia.  we will pursue further evaluation of suspected underlying pneumonia as further detailed below, including pursuit of CTA chest as well as procalcitonin level.  If procalcitonin level is not elevated and if CTA chest shows no evidence of source of the patient's acute respiratory symptoms, may then consider pursuit of viral respiratory panel.  Presenting COVID, influenza, RSV PCR are all negative.  It is noted that it appears she had a history of similar prompting January 2025 hospitalization, which was complicated by development of adrenal insufficiency as a consequence of associated suspected sepsis.  SIRS criteria met via objective fever, leukocytosis with neutrophilic predominance, tachycardia. Lactic acid level: 1.9. Of note, given the associated presence of suspected end organ damage in the form of concominant presenting acute hypoxic respiratory failure as well as acute kidney injury, criteria are met for pt's sepsis to be considered severe in nature.  Of note, relative to her presenting documented weight of 75.1 kg, the patient is now received 30 mL/kg ivf bolus after receiving 2.5 L LR bolus in the emergency department.  Additional ED  work-up/management notable for: In the emergency department, blood cultures x 2 were collected prior to initiation of azithromycin  and Rocephin  for suspected underlying commune acquired pneumonia.  Given her presenting severe sepsis, acute hypoxic respiratory failure, as well as her chronically immunocompromised state, we will continue azithromycin  for coverage of atypical pneumonia, but also broaden spectrum of IV antibiotic coverage to include IV vancomycin  and cefepime , pending CTA chest evaluation, as above.   Urinalysis ordered, with result currently pending.  Plan: CBC w/ diff and CMP in AM.  Follow for results of blood cx's x 2. Abx: Continue azithromycin  and start vancomycin  and cefepime , as above.  Started for continuous lactated Ringer 's at 150 cc/h.  Check sputum culture, procalcitonin.  Check strep pneumoniae urine antigen.  CTA chest, as further detailed above.  Follow-up for result of urinalysis.  Monitor on telemetry.  Prn acetaminophen  for fever.  Flutter valve, incentive spirometry.  Scheduled guaifenesin , as needed Tessalon  Perles.  Check MRSA PCR.                     #) Acute Kidney Injury:   Presenting creatinine found to be 1.49 compared to most recent prior value of 1.0 on 05/23/2024.  Suspect prerenal contribution as result of diminished renal perfusion and oxygen delivery capacity resulting from her presenting acute hypoxic respiratory failure, as above potential additional prerenal contribution from diminished intravascular  volume as a consequence of presenting severe sepsis.  Urinalysis with microscopy has been ordered, with result currently pending.  Plan: monitor strict I's & O's and daily weights. Attempt to avoid nephrotoxic agents. Refrain from NSAIDs. Repeat CMP in the morning. Check serum magnesium  level.  Follow-up for result urinalysis with microscopy.  Add-on random urine sodium and random urine creatinine.  Check CPK level.  IV fluids, as above.  Further  evaluation management of acute Evoxac respiratory failure as well as suspected severe sepsis, as above.                      #) Acute hypo-osmolar hypovolumeic hyponatremia: Presenting serum sodium of 131, relative to most recent prior value of 139 on 05/23/2024. Suspect an element of hypovolumeia, with suspected contribution from dehydration in the setting of clinical evidence of such as well as report of decline in oral intake over the course the last 1 to 2 days.   Differential also includes the possibility of a contribution from SIADH, particularly in the setting of her suspected presenting acute respiratory infection. in general, will provide gentle IV fluids to attend to suspected contribution from dehydration, while further evaluating for any additional contributing factors, including SIADH, as further detailed below.   She also has a documented history of development of adrenal insufficiency at the time of hospitalization  in January 2025 for severe sepsis due to suspected acute respiratory infection.  Consequently, will add on random cortisol level at this time.   no evidence of associated acute focal neurologic deficits and no report of recent trauma.  No overt outpatient pharmacologic factors identified.   Plan: monitor strict I's and O's and daily weights.  check UA, random urine sodium, urine osmolality.  Check serum osmolality to confirm suspected hypoosmolar etiology.  Repeat CMP in the morning. Check TSH.  Add on random cortisol level, as above.  IV fluids, as above.  Check BNP in the context of a history of chronic diastolic heart failure.                       #) Chronic diastolic heart failure: documented history of such, with most recent echocardiogram performed in January 2025, which was notable for LVEF 55 to 60%, grade 1 diastolic dysfunction, with additional results as conveyed above. No clinical or radiographic evidence to suggest acutely  decompensated heart failure at this time. home diuretic regimen reportedly consists of the following: Torsemide.  Will closely monitor for ensuing evidence of development of acute volume overload in the context of current plan for additional IV fluids in the setting of presenting severe sepsis, as above.   Plan: monitor strict I's & O's and daily weights. Repeat CMP in AM. Check serum mag level.  Hold home torsemide for now.  Add on BNP, with plan to repeat BMP tomorrow morning.                     #) Rheumatoid arthritis: Documented history of such for which he is on chronic prednisone  therapy.  In the context of plan for initiation solumedrol of treatment of presenting acute COPD exacerbation, will hold home prednisone  for now.  No reported history of associated interstitial lung disease.  Plan: Hold home prednisone  for now in the setting of as above.  Follow-up for result of CTA chest, as above.                    #)  Hyperlipidemia: documented h/o such. On atorvastatin  as outpatient.   Plan: continue home statin.  Follow-up for result of CPK level, as above.                     #) Essential Hypertension: documented h/o such, with outpatient antihypertensive regimen including amlodipine .  SBP's in the ED today: High 90s to 120s mmHg. however, in the setting of presenting suspected severe sepsis, will hold home amlodipine  for now.  Plan: Close monitoring of subsequent BP via routine VS. home amlodipine  for now, as above.  Monitor strict I's and O's and daily weights.                      #) Allergic Rhinitis: documented h/o such, on scheduled Allegra  as outpatient, in the absence of any outpatient use of intranasal corticosteroid.    Plan: cont home Allegra .                       #) Anemia of chronic disease: Documented history of such, a/w with baseline hgb range 10-12, with presenting hgb  consistent with this range, in the absence of any overt evidence of active bleed.  INR and platelet ct  within normal limits as well.  No evidence to suggest DIC at this time.  She is not on any blood thinners as an outpatient.   Plan: Repeat CBC in the morning.     DVT prophylaxis: SCD's   Code Status: Full code Family Communication: none Disposition Plan: Per Rounding Team Consults called: none;  Admission status: Inpatient    I SPENT GREATER THAN 75  MINUTES IN CLINICAL CARE TIME/MEDICAL DECISION-MAKING IN COMPLETING THIS ADMISSION.     Tovia Kisner B Maximiliano Cromartie DO [facility] From 7PM - 7AM   08/04/2024, 9:13 PM

## 2024-08-04 NOTE — Sepsis Progress Note (Signed)
 Sepsis protocol monitored by eLink

## 2024-08-04 NOTE — Telephone Encounter (Signed)
 Stat labs indicate WBC> 20. She is still running fever> 99.0. HR 105 at this time. Advised to send to ED for further evaluation.

## 2024-08-04 NOTE — ED Triage Notes (Signed)
 Patient BIB EMS from friend's home c/o weakness and body aches x 2 days. Per report worsening weakness tonight. Per EMS patient o2 was 86-88% on RA, 2LNC started. Patient denies N/V/D. Patient denies dysuria.

## 2024-08-04 NOTE — ED Provider Notes (Signed)
 [place] EMERGENCY DEPARTMENT AT [facility] Provider Note   CSN: [id number] Arrival date & time: 08/04/24  1756     Patient presents with: Weakness   Eileen Wilkerson is a 89 y.o. female.   89 year old who presents with 24 hours of fever, cough, congestion.  Increasing shortness of breath.  No sick increasing weakness.  No diffuse body myalgias.  Denies any vomiting or diarrhea.  No urinary symptoms.  EMS was called and patient's O2 sat was between 8668%.  Placed on 2 L of oxygen.  Transported here for further evaluation       Prior to Admission medications   Medication Sig Start Date End Date Taking? Authorizing Provider  acetaminophen  (TYLENOL ) 325 MG tablet Take 650 mg by mouth 3 (three) times daily.    [provider]  allopurinol (ZYLOPRIM) 100 MG tablet Take 100 mg by mouth daily. 01/08/24   [provider]  amLODipine  (NORVASC ) 5 MG tablet Take 5 mg by mouth daily.    [provider]  atorvastatin  (LIPITOR) 20 MG tablet TAKE 1 TABLET BY MOUTH EVERY DAY 03/26/23   Mast, [name], NP  Biotin  5 MG TBDP Take 1 tablet (5 mg total) by mouth daily. 06/20/22   Medina-Vargas, Monina C, NP  cholecalciferol  (VITAMIN D3) 25 MCG (1000 UNIT) tablet Take 1,000 Units by mouth daily.    [provider]  cycloSPORINE  (RESTASIS ) 0.05 % ophthalmic emulsion Place 1 drop into both eyes 2 (two) times daily. 06/20/22   Medina-Vargas, Monina C, NP  D-Mannose 500 MG CAPS Take 500 mg by mouth daily.    [provider]  diclofenac  Sodium (VOLTAREN ) 1 % GEL APPLY 2 TO 4 GRAMS TOPICALLY TO AFFECTED JOINTS UP TO 4 TIMES DAILY 06/20/22   Medina-Vargas, Monina C, NP  docusate sodium  (COLACE) 100 MG capsule Take 1 capsule (100 mg total) by mouth 2 (two) times daily. 03/11/24   Medina-Vargas, Monina C, NP  fexofenadine  (ALLEGRA ) 60 MG tablet Take 60 mg by mouth daily.    [provider]  ipratropium-albuterol  (DUONEB) 0.5-2.5 (3) MG/3ML SOLN Take 3 mLs  by nebulization as needed.    [provider]  lansoprazole  (PREVACID ) 30 MG capsule TAKE 1 CAPSULE BY MOUTH ONCE DAILY AT NOON 10/10/22   Mast, [name], NP  lidocaine  4 % Place 1 patch onto the skin daily. Apply to right ankle    [provider]  montelukast  (SINGULAIR ) 10 MG tablet TAKE 1 TABLET BY MOUTH EVERYDAY AT BEDTIME 08/28/22   Mast, [name], NP  ondansetron  (ZOFRAN ) 4 MG tablet Take 4 mg by mouth as needed for nausea or vomiting.    [provider]  potassium chloride  SA (KLOR-CON  M) 20 MEQ tablet Take 40 mEq by mouth 2 (two) times daily.    [provider]  predniSONE  (DELTASONE ) 5 MG tablet Take 5 mg by mouth daily with breakfast.    [provider]  saccharomyces boulardii (FLORASTOR) 250 MG capsule Take 250 mg by mouth 2 (two) times daily.    [provider]  Torsemide 40 MG TABS Take 40 mg by mouth daily. 09/04/23   [provider]  traMADol  (ULTRAM ) 50 MG tablet Take 50 mg by mouth 2 (two) times daily.    [provider]  traMADol  (ULTRAM ) 50 MG tablet Take 1 tablet (50 mg total) by mouth every 6 (six) hours as needed (pain). 06/18/24   Gil Greig BRAVO, NP    Allergies: Adhesive [tape], Celebrex [  celecoxib], Ciprofibrate, Cymbalta [duloxetine hcl], Gabitril [tiagabine], Lyrica [pregabalin], Neurontin [gabapentin], Nexium  [esomeprazole ], Nsaids, Shrimp [shellfish allergy], Azactam  [aztreonam ], Azelastine  hcl, Ciprofloxacin , Claritin  [loratadine ], Methotrexate  derivatives, Nasacort  [triamcinolone ], Olopatadine , Other, Sulfamethizole, Zantac [ranitidine hcl], Claritin -d 12 hour [loratadine -pseudoephedrine er], Keflex  [cephalexin ], Penicillins, and Sulfa antibiotics    Review of Systems  All other systems reviewed and are negative.   Updated Vital Signs BP (!) 129/50   Pulse 93   Temp (!) 101.2 F (38.4 C) (Rectal)   Resp 20   SpO2 98%   Physical Exam Vitals and nursing note reviewed.  Constitutional:      General:  She is not in acute distress.    Appearance: Normal appearance. She is well-developed. She is not toxic-appearing.  HENT:     Head: Normocephalic and atraumatic.  Eyes:     General: Lids are normal.     Conjunctiva/sclera: Conjunctivae normal.     Pupils: Pupils are equal, round, and reactive to light.  Neck:     Thyroid : No thyroid  mass.     Trachea: No tracheal deviation.  Cardiovascular:     Rate and Rhythm: Normal rate and regular rhythm.     Heart sounds: Normal heart sounds. No murmur heard.    No gallop.  Pulmonary:     Effort: Tachypnea and respiratory distress present.     Breath sounds: No stridor. Decreased breath sounds present. No wheezing, rhonchi or rales.  Abdominal:     General: There is no distension.     Palpations: Abdomen is soft.     Tenderness: There is no abdominal tenderness. There is no rebound.  Musculoskeletal:        General: No tenderness. Normal range of motion.     Cervical back: Normal range of motion and neck supple.  Skin:    General: Skin is warm and dry.     Findings: No abrasion or rash.  Neurological:     Mental Status: She is alert and oriented to person, place, and time. Mental status is at baseline.     GCS: GCS eye subscore is 4. GCS verbal subscore is 5. GCS motor subscore is 6.     Cranial Nerves: No cranial nerve deficit.     Sensory: No sensory deficit.     Motor: Motor function is intact.  Psychiatric:        Attention and Perception: Attention normal.        Speech: Speech normal.        Behavior: Behavior normal.     (all labs ordered are listed, but only abnormal results are displayed) Labs Reviewed  RESP PANEL BY RT-PCR (RSV, FLU A&B, COVID)  [id number]  CULTURE, BLOOD (ROUTINE X 2)  CULTURE, BLOOD (ROUTINE X 2)  COMPREHENSIVE METABOLIC PANEL WITH GFR  CBC WITH DIFFERENTIAL/PLATELET  PROTIME-INR  URINALYSIS, W/ REFLEX TO CULTURE (INFECTION SUSPECTED)  I-STAT CG4 LACTIC ACID, ED    EKG: EKG  Interpretation Date/Time:  Monday August 04 2024 18:44:54 EDT Ventricular Rate:  90 PR Interval:  215 QRS Duration:  115 QT Interval:  379 QTC Calculation: 464 R Axis:   -64  Text Interpretation: Sinus rhythm Borderline prolonged PR interval Incomplete RBBB and LAFB Low voltage, precordial leads Confirmed by Dasie Faden ([id number]) on 08/04/2024 8:12:13 PM  Radiology: No results found.   Procedures   Medications Ordered in the ED  lactated ringers  infusion (has no administration in time range)  cefTRIAXone  (ROCEPHIN ) 2 g in sodium chloride  0.9 % 100 mL IVPB (has no  administration in time range)  azithromycin  (ZITHROMAX ) 500 mg in sodium chloride  0.9 % 250 mL IVPB (has no administration in time range)  lactated ringers  bolus 1,000 mL (has no administration in time range)    And  lactated ringers  bolus 1,000 mL (has no administration in time range)    And  lactated ringers  bolus 500 mL (has no administration in time range)  acetaminophen  (TYLENOL ) tablet 650 mg (has no administration in time range)                                    Medical Decision Making Amount and/or Complexity of Data Reviewed Labs: ordered. Radiology: ordered.  Risk OTC drugs. Prescription drug management.  Patient's EKG showed normal sinus rhythm at a rate of 90. Patient febrile here to 1-1.2.  Given Tylenol .  Clinically patient likely has pneumonia although her chest x-ray did not show that.  She does have a new oxygen requirement and is on 4 L right now.  She has has a white count of 20,000.  Sepsis protocol started with IV fluids and IV antibiotics.  COVID and flu test negative.  Lactate not elevated.  Pressure has been stable.  No evidence of shock at this time.  Plan will be for inpatient admission.  Family at bedside and informed of plan.  CRITICAL CARE Performed by: Curtistine ONEIDA Dawn Total critical care time: 55 minutes Critical care time was exclusive of separately billable procedures and  treating other patients. Critical care was necessary to treat or prevent imminent or life-threatening deterioration. Critical care was time spent personally by me on the following activities: development of treatment plan with patient and/or surrogate as well as nursing, discussions with consultants, evaluation of patient's response to treatment, examination of patient, obtaining history from patient or surrogate, ordering and performing treatments and interventions, ordering and review of laboratory studies, ordering and review of radiographic studies, pulse oximetry and re-evaluation of patient's condition.      Final diagnoses:  None    ED Discharge Orders     None          Dawn Curtistine, MD 08/04/24 2012

## 2024-08-04 NOTE — Progress Notes (Signed)
 Pharmacy Antibiotic Note  Eileen Wilkerson is a 89 y.o. female admitted on 08/04/2024 with sepsis. Pharmacy has been consulted for vancomycin  and cefepime  dosing.  Patient given one-time doses of ceftriaxone  and azithromycin  while in the ED 8/25 evening. WBC 20.3, temp 101.19F on admission. Scr currently 1.49 and baseline appears to be closer to 1.  Plan: - Start cefepime  2g IV q24hrs - Give vancomycin  1500mg  x1, followed by 1250mg  IV q48hrs (eAUC 491 using Scr 1.49, IBW, and Vd 0.72) - Vanc levels PRN - Monitor renal function, cultures, and overall clinical picture - De-escalate abx as able     Temp (24hrs), Avg:99.3 F (37.4 C), Min:97.7 F (36.5 C), Max:101.2 F (38.4 C)  Recent Labs  Lab 08/04/24 1848 08/04/24 1854  WBC  --  20.3*  CREATININE  --  1.49*  LATICACIDVEN 1.9  --     Estimated Creatinine Clearance: 25.9 mL/min (A) (by C-G formula based on SCr of 1.49 mg/dL (H)).    Allergies  Allergen Reactions   Adhesive [Tape] Rash   Celebrex [Celecoxib] Hives   Ciprofibrate Nausea Only   Cymbalta [Duloxetine Hcl] Swelling   Gabitril [Tiagabine] Swelling   Lyrica [Pregabalin] Swelling   Neurontin [Gabapentin] Swelling   Nexium  [Esomeprazole ] Rash   Nsaids Rash   Shrimp [Shellfish Allergy] Anaphylaxis    Per patient shrimp only   Azactam  [Aztreonam ]     Hand swelling    Azelastine  Hcl     Rash    Ciprofloxacin  Other (See Comments)    dizziness   Claritin  [Loratadine ]     Irritability Nervousness    Methotrexate  Derivatives    Nasacort  [Triamcinolone ]     Dizzy    Olopatadine  Other (See Comments)    Pain and lethargy    Other    Sulfamethizole Other (See Comments)    unknown   Zantac [Ranitidine Hcl] Other (See Comments)    unknown   Claritin -D 12 Hour [Loratadine -Pseudoephedrine Er] Anxiety   Keflex  [Cephalexin ] Nausea And Vomiting    Tolerated Ancef    Penicillins Rash    Injection site reaction. Tolerated cefepime  in past. Also reports tolerating a  penicillin infusion after this initial rxn ~20 yrs ago.  Has patient had a PCN reaction causing immediate rash, facial/tongue/throat swelling, SOB or lightheadedness with hypotension: No Has patient had a PCN reaction causing severe rash involving mucus membranes or skin necrosis: No Has patient had a PCN reaction that required hospitalization No Has patient had a PCN reaction occurring within the last 10 years: No     Sulfa Antibiotics Nausea And Vomiting    Antimicrobials this admission: 8/25 ceftriaxone  x1 8/25 azithromycin  >>  8/25 vancomycin  >> 8/25 cefepime  >>  Dose adjustments this admission: N/A  Microbiology results: 8/25 BCx: in process  8/25 resp panel: negative    Thank you for allowing pharmacy to be a part of this patient's care.  Lacinda Moats, PharmD Clinical Pharmacist  8/25/20258:47 PM

## 2024-08-04 NOTE — Progress Notes (Signed)
 Location:  [facility] Room Number: 925 A Place of Service:  ALF [phone]) Provider:  Greig Cluster, NP    [name], [name], [name]  Extended Emergency Contact Information Primary Emergency Contact: Debrew,Jacqueline  [place] Home Phone: [phone] Mobile Phone: [phone] Relation: Daughter  Code Status:  DNR Goals of care: Advanced Directive information    07/10/2024   10:41 AM  Advanced Directives  Does Patient Have a Medical Advance Directive? Yes  Type of Estate agent of [place];Out of facility DNR (pink MOST or yellow form)  Does patient want to make changes to medical advance directive? No - Patient declined  Copy of Healthcare Power of Attorney in Chart? Yes - validated most recent copy scanned in chart (See row information)     Chief Complaint  Patient presents with   Fever    HPI:  Pt is a 89 y.o. female seen today for acute visit due to fever.   She currently resides on the assisted living unit at [facility]. PMH: CHF, HTN, HLD, asthma, pulmonary nodules, GERD, diverticulitis, diabetes, osteoporosis, OA, CKD, fibromyalgia and gait abnormality.   08/25 she woke up with 99.0 fever. She told nursing staff she was not feel well. O2 sats 88-90%. She denies chest pain or shortness of breath. She skipped breakfast and lunch. She has had one episode nausea. She is able to drink fluids. Admits to daily bowel movements. No contacts with sick persons. She is given tylenol  650 mg TID.    Past Medical History:  Diagnosis Date   Adrenal failure (HCC)    Arthritis    Asthma    Cancer (HCC)    Cataract    Closed nondisplaced fracture of fifth right metatarsal bone 09/18/2017   Diverticulitis    Per patient   E coli bacteremia    Fibromyalgia 2008   GERD (gastroesophageal reflux disease) 12/18/2016   HCAP (healthcare-associated pneumonia) 02/03/2018    Hyperlipidemia 10/13/2021   Osteoporosis    RA (rheumatoid arthritis) (HCC)    Recurrent upper respiratory infection (URI)    Sepsis due to urinary tract infection (HCC) 01/18/2018   Urticaria    Past Surgical History:  Procedure Laterality Date   BACK SURGERY     BILATERAL CARPAL TUNNEL RELEASE  2005   right and left   CATARACT EXTRACTION, BILATERAL  2004   right and left   CERVICAL FUSION  2011,2010,2008   2 disks   HEEL SPUR SURGERY  2004   lower back fusion  2011   Fusion of 3-4 and 4-5 lower back   RADIOFREQUENCY ABLATION  2020   ROTATOR CUFF REPAIR  [id number]   SQUAMOUS CELL CARCINOMA EXCISION     TONSILLECTOMY AND ADENOIDECTOMY  1947   TOTAL SHOULDER ARTHROPLASTY      Allergies  Allergen Reactions   Adhesive [Tape] Rash   Celebrex [Celecoxib] Hives   Ciprofibrate Nausea Only   Cymbalta [Duloxetine Hcl] Swelling   Gabitril [Tiagabine] Swelling   Lyrica [Pregabalin] Swelling   Neurontin [Gabapentin] Swelling   Nexium  [Esomeprazole ] Rash   Nsaids Rash   Shrimp [Shellfish Allergy] Anaphylaxis    Per patient shrimp only   Azactam  [Aztreonam ]     Hand swelling    Azelastine  Hcl     Rash    Ciprofloxacin  Other (See Comments)    dizziness   Claritin  [Loratadine ]     Irritability Nervousness    Methotrexate   Derivatives    Nasacort  [Triamcinolone ]     Dizzy    Olopatadine  Other (See Comments)    Pain and lethargy    Other    Sulfamethizole Other (See Comments)    unknown   Zantac [Ranitidine Hcl] Other (See Comments)    unknown   Claritin -D 12 Hour [Loratadine -Pseudoephedrine Er] Anxiety   Keflex  [Cephalexin ] Nausea And Vomiting    Tolerated Ancef    Penicillins Rash    Injection site reaction. Tolerated cefepime  in past. Also reports tolerating a penicillin infusion after this initial rxn ~20 yrs ago.  Has patient had a PCN reaction causing immediate rash, facial/tongue/throat swelling, SOB or lightheadedness with hypotension: No Has patient had a PCN  reaction causing severe rash involving mucus membranes or skin necrosis: No Has patient had a PCN reaction that required hospitalization No Has patient had a PCN reaction occurring within the last 10 years: No     Sulfa Antibiotics Nausea And Vomiting    Outpatient Encounter Medications as of 08/04/2024  Medication Sig   acetaminophen  (TYLENOL ) 325 MG tablet Take 650 mg by mouth 3 (three) times daily.   allopurinol (ZYLOPRIM) 100 MG tablet Take 100 mg by mouth daily.   amLODipine  (NORVASC ) 5 MG tablet Take 5 mg by mouth daily.   atorvastatin  (LIPITOR) 20 MG tablet TAKE 1 TABLET BY MOUTH EVERY DAY   Biotin  5 MG TBDP Take 1 tablet (5 mg total) by mouth daily.   cholecalciferol  (VITAMIN D3) 25 MCG (1000 UNIT) tablet Take 1,000 Units by mouth daily.   cycloSPORINE  (RESTASIS ) 0.05 % ophthalmic emulsion Place 1 drop into both eyes 2 (two) times daily.   D-Mannose 500 MG CAPS Take 500 mg by mouth daily.   diclofenac  Sodium (VOLTAREN ) 1 % GEL APPLY 2 TO 4 GRAMS TOPICALLY TO AFFECTED JOINTS UP TO 4 TIMES DAILY   docusate sodium  (COLACE) 100 MG capsule Take 1 capsule (100 mg total) by mouth 2 (two) times daily.   fexofenadine  (ALLEGRA ) 60 MG tablet Take 60 mg by mouth daily.   lansoprazole  (PREVACID ) 30 MG capsule TAKE 1 CAPSULE BY MOUTH ONCE DAILY AT NOON   lidocaine  4 % Place 1 patch onto the skin daily. Apply to right ankle   montelukast  (SINGULAIR ) 10 MG tablet TAKE 1 TABLET BY MOUTH EVERYDAY AT BEDTIME   potassium chloride  SA (KLOR-CON  M) 20 MEQ tablet Take 40 mEq by mouth 2 (two) times daily.   predniSONE  (DELTASONE ) 5 MG tablet Take 5 mg by mouth daily with breakfast.   saccharomyces boulardii (FLORASTOR) 250 MG capsule Take 250 mg by mouth 2 (two) times daily.   Torsemide 40 MG TABS Take 40 mg by mouth daily.   traMADol  (ULTRAM ) 50 MG tablet Take 50 mg by mouth 2 (two) times daily.   ipratropium-albuterol  (DUONEB) 0.5-2.5 (3) MG/3ML SOLN Take 3 mLs by nebulization as needed.   ondansetron   (ZOFRAN ) 4 MG tablet Take 4 mg by mouth as needed for nausea or vomiting.   traMADol  (ULTRAM ) 50 MG tablet Take 1 tablet (50 mg total) by mouth every 6 (six) hours as needed (pain).   No facility-administered encounter medications on file as of 08/04/2024.    Review of Systems  Constitutional:  Positive for appetite change, fatigue and fever. Negative for chills.  HENT:  Negative for congestion, sore throat and trouble swallowing.   Respiratory:  Negative for cough, shortness of breath and wheezing.   Cardiovascular:  Negative for chest pain and leg swelling.  Gastrointestinal:  Positive for  nausea. Negative for abdominal distention, abdominal pain, constipation, diarrhea and vomiting.  Genitourinary:  Negative for dysuria, frequency and hematuria.  Musculoskeletal:  Positive for gait problem.  Skin:  Negative for wound.  Neurological:  Positive for weakness. Negative for dizziness and light-headedness.  Psychiatric/Behavioral:  Negative for confusion, dysphoric mood and sleep disturbance. The patient is not nervous/anxious.     Immunization History  Administered Date(s) Administered   Fluad Quad(high Dose 65+) 09/30/2020   INFLUENZA, HIGH DOSE SEASONAL PF 09/24/2015, 08/24/2016, 08/16/2018, 09/08/2019, 09/24/2020, 10/04/2021, 09/12/2023   Influenza,inj,Quad PF,6+ Mos 08/11/2016   Influenza-Unspecified 10/04/2012, 08/19/2013, 08/28/2014, 11/12/2017, 10/05/2022   Moderna Covid-19 Vaccine Bivalent Booster 70yrs & up 10/04/2021, 09/26/2023   Moderna Sars-Covid-2 Vaccination 12/15/2019, 01/12/2020, 10/19/2020   PNEUMOCOCCAL CONJUGATE-20 10/04/2021   PPD Test 03/01/2018   Pneumococcal Conjugate-13 09/13/2015, 06/26/2017   Pneumococcal Polysaccharide-23 12/11/2009   Tdap 04/04/2023   Zoster Recombinant(Shingrix) 01/28/2019, 09/02/2019   Pertinent  Health Maintenance Due  Topic Date Due   INFLUENZA VACCINE  07/11/2024   DEXA SCAN  Completed      01/31/2023    1:08 PM 02/19/2023     2:29 PM 03/06/2023   11:57 AM 03/13/2023   10:48 AM 03/22/2023   10:53 AM  Fall Risk  Falls in the past year? 0 1 0 0 0  Was there an injury with Fall? 0 1 0 0 0  Fall Risk Category Calculator 0 3 0 0 0  Patient at Risk for Falls Due to Impaired balance/gait;Impaired mobility History of fall(s);Impaired balance/gait History of fall(s);Impaired balance/gait History of fall(s);Impaired balance/gait History of fall(s);Impaired balance/gait  Fall risk Follow up Falls evaluation completed Falls evaluation completed Falls evaluation completed Falls evaluation completed Falls evaluation completed   Functional Status Survey:    Vitals:   08/04/24 1334  BP: (!) 122/59  Pulse: 61  Resp: 19  Temp: 97.7 F (36.5 C)  SpO2: 95%  Weight: 165 lb 9.6 oz (75.1 kg)  Height: 5' 5 (1.651 m)   Body mass index is 27.56 kg/m. Physical Exam Vitals reviewed.  Constitutional:      General: She is not in acute distress. HENT:     Head: Normocephalic.     Right Ear: Tympanic membrane normal.     Left Ear: Tympanic membrane normal.     Nose: Nose normal.     Mouth/Throat:     Mouth: Mucous membranes are moist.     Pharynx: No oropharyngeal exudate or posterior oropharyngeal erythema.  Eyes:     General:        Right eye: No discharge.        Left eye: No discharge.  Cardiovascular:     Rate and Rhythm: Regular rhythm. Tachycardia present.     Pulses: Normal pulses.     Heart sounds: Normal heart sounds.     Comments: APical HR 100-106 Pulmonary:     Effort: Pulmonary effort is normal. No respiratory distress.     Breath sounds: Examination of the right-middle field reveals decreased breath sounds. Examination of the left-middle field reveals decreased breath sounds. Decreased breath sounds present. No wheezing.  Abdominal:     General: Bowel sounds are normal. There is no distension.     Palpations: Abdomen is soft.     Tenderness: There is no abdominal tenderness.  Musculoskeletal:      Cervical back: Neck supple.     Right lower leg: No edema.     Left lower leg: No edema.  Skin:  Capillary Refill: Capillary refill takes less than 2 seconds.     Comments: Forehead hot to touch  Neurological:     General: No focal deficit present.     Mental Status: She is alert and oriented to person, place, and time.     Motor: Weakness present.     Gait: Gait abnormal.  Psychiatric:        Mood and Affect: Mood normal.     Labs reviewed: Recent Labs    12/21/23 1639 12/22/23 0338 12/24/23 0346 12/25/23 0356 12/26/23 0418 05/23/24 0000  NA  --    < > 140 139 140 139  K  --    < > 3.2* 3.3* 3.2* 4.1  CL  --    < > 113* 110 107 107  CO2  --    < > 23 23 25 21   GLUCOSE  --    < > 85 83 87  --   BUN  --    < > 15 11 7* 12  CREATININE 1.07*   < > 0.84 0.78 0.77 1.0  CALCIUM   --    < > 8.8* 8.6* 8.9 9.4  MG 1.6*  --  2.3 2.2 2.1  --   PHOS 3.6  --   --   --   --   --    < > = values in this interval not displayed.   Recent Labs    12/21/23 0900 12/22/23 0338 12/26/23 0418 05/23/24 0000  AST 50* 45* 52* 16  ALT 28 26 57* 11  ALKPHOS 75 57 61 82  BILITOT 0.8 0.6 0.8  --   PROT 7.0 5.2* 6.1*  --   ALBUMIN  3.3* 2.5* 2.8* 3.6   Recent Labs    12/21/23 0900 12/21/23 1639 12/22/23 0338 12/24/23 0346 12/26/23 0418 05/23/24 0000  WBC 9.9   < > 7.5 8.1 10.0 11.5  NEUTROABS 6.8  --   --  3.5 4.7  --   HGB 13.7   < > 10.6* 10.3* 11.6* 11.9*  HCT 44.7   < > 34.1* 32.8* 37.1 38  MCV 94.1   < > 93.9 92.4 92.8  --   PLT 226   < > 169 156 188 302   < > = values in this interval not displayed.   Lab Results  Component Value Date   TSH 0.865 02/27/2022   Lab Results  Component Value Date   HGBA1C 6.5 11/28/2023   Lab Results  Component Value Date   CHOL 151 06/05/2023   HDL 47 06/05/2023   LDLCALC 77 06/05/2023   TRIG 174 (A) 06/05/2023   CHOLHDL 2.6 04/06/2020    Significant Diagnostic Results in last 30 days:  No results found.  Assessment/Plan 1.  Fever, unspecified fever cause (Primary) - onset x 1 day - fever 99.0 > receives tylenol  650 mg TID - HR 100-106 on exam - lung sounds diminished - rapid covid/flu - stat CXR - stat UA/culture - stat WBC/bmp  2. Prediabetes - steroid induced - A1c 6.5 12/2023 - diet controlled  3. Fibromyalgia - stable with steroids and tramadol    4. Chronic diastolic heart failure (HCC) - LVEF 55-60% 12/2023 - cont torsemide   5. Uncomplicated asthma, unspecified asthma severity, unspecified whether persistent - no wheezing or cough - on low dose prednisone  - cont Singulair  and duonebs prn   Family/ staff Communication: plan discussed with patient and nurse  Labs/tests ordered:  stat CXR, UA/culture, cbc/diff, bmp, covid/flu

## 2024-08-05 ENCOUNTER — Inpatient Hospital Stay (HOSPITAL_COMMUNITY)

## 2024-08-05 DIAGNOSIS — J9601 Acute respiratory failure with hypoxia: Secondary | ICD-10-CM | POA: Diagnosis not present

## 2024-08-05 LAB — CBC WITH DIFFERENTIAL/PLATELET
Abs Immature Granulocytes: 0.07 K/uL (ref 0.00–0.07)
Basophils Absolute: 0 K/uL (ref 0.0–0.1)
Basophils Relative: 0 %
Eosinophils Absolute: 0 K/uL (ref 0.0–0.5)
Eosinophils Relative: 0 %
HCT: 34.9 % — ABNORMAL LOW (ref 36.0–46.0)
Hemoglobin: 10.5 g/dL — ABNORMAL LOW (ref 12.0–15.0)
Immature Granulocytes: 0 %
Lymphocytes Relative: 6 %
Lymphs Abs: 0.9 K/uL (ref 0.7–4.0)
MCH: 28.3 pg (ref 26.0–34.0)
MCHC: 30.1 g/dL (ref 30.0–36.0)
MCV: 94.1 fL (ref 80.0–100.0)
Monocytes Absolute: 0.4 K/uL (ref 0.1–1.0)
Monocytes Relative: 3 %
Neutro Abs: 14.7 K/uL — ABNORMAL HIGH (ref 1.7–7.7)
Neutrophils Relative %: 91 %
Platelets: 222 K/uL (ref 150–400)
RBC: 3.71 MIL/uL — ABNORMAL LOW (ref 3.87–5.11)
RDW: 16.4 % — ABNORMAL HIGH (ref 11.5–15.5)
WBC: 16.1 K/uL — ABNORMAL HIGH (ref 4.0–10.5)
nRBC: 0 % (ref 0.0–0.2)

## 2024-08-05 LAB — EXPECTORATED SPUTUM ASSESSMENT W GRAM STAIN, RFLX TO RESP C

## 2024-08-05 LAB — MAGNESIUM: Magnesium: 1.8 mg/dL (ref 1.7–2.4)

## 2024-08-05 LAB — COMPREHENSIVE METABOLIC PANEL WITH GFR
ALT: 15 U/L (ref 0–44)
AST: 25 U/L (ref 15–41)
Albumin: 2.5 g/dL — ABNORMAL LOW (ref 3.5–5.0)
Alkaline Phosphatase: 74 U/L (ref 38–126)
Anion gap: 10 (ref 5–15)
BUN: 16 mg/dL (ref 8–23)
CO2: 21 mmol/L — ABNORMAL LOW (ref 22–32)
Calcium: 8.5 mg/dL — ABNORMAL LOW (ref 8.9–10.3)
Chloride: 108 mmol/L (ref 98–111)
Creatinine, Ser: 0.92 mg/dL (ref 0.44–1.00)
GFR, Estimated: 60 mL/min — ABNORMAL LOW (ref >60)
Glucose, Bld: 162 mg/dL — ABNORMAL HIGH (ref 70–99)
Potassium: 4.3 mmol/L (ref 3.5–5.1)
Sodium: 139 mmol/L (ref 135–145)
Total Bilirubin: 0.7 mg/dL (ref 0.0–1.2)
Total Protein: 6.3 g/dL — ABNORMAL LOW (ref 6.5–8.1)

## 2024-08-05 LAB — PHOSPHORUS: Phosphorus: 3 mg/dL (ref 2.5–4.6)

## 2024-08-05 LAB — OSMOLALITY: Osmolality: 297 mosm/kg — ABNORMAL HIGH (ref 275–295)

## 2024-08-05 LAB — OSMOLALITY, URINE: Osmolality, Ur: 415 mosm/kg (ref 300–900)

## 2024-08-05 LAB — STREP PNEUMONIAE URINARY ANTIGEN: Strep Pneumo Urinary Antigen: NEGATIVE

## 2024-08-05 LAB — MRSA NEXT GEN BY PCR, NASAL: MRSA by PCR Next Gen: NOT DETECTED

## 2024-08-05 LAB — BRAIN NATRIURETIC PEPTIDE: B Natriuretic Peptide: 176.2 pg/mL — ABNORMAL HIGH (ref 0.0–100.0)

## 2024-08-05 MED ORDER — SODIUM CHLORIDE 0.9 % IV SOLN
2.0000 g | Freq: Two times a day (BID) | INTRAVENOUS | Status: DC
  Administered 2024-08-05 – 2024-08-06 (×4): 2 g via INTRAVENOUS
  Filled 2024-08-05 (×4): qty 12.5

## 2024-08-05 MED ORDER — SODIUM CHLORIDE 0.9 % IV BOLUS
500.0000 mL | Freq: Once | INTRAVENOUS | Status: AC
  Administered 2024-08-05: 500 mL via INTRAVENOUS

## 2024-08-05 MED ORDER — IPRATROPIUM-ALBUTEROL 0.5-2.5 (3) MG/3ML IN SOLN
3.0000 mL | Freq: Three times a day (TID) | RESPIRATORY_TRACT | Status: DC
  Administered 2024-08-05 – 2024-08-08 (×10): 3 mL via RESPIRATORY_TRACT
  Filled 2024-08-05 (×11): qty 3

## 2024-08-05 MED ORDER — ORAL CARE MOUTH RINSE: 15.0000 mL | OROMUCOSAL | Status: DC | PRN

## 2024-08-05 MED ORDER — VANCOMYCIN HCL 1500 MG/300ML IV SOLN
1500.0000 mg | INTRAVENOUS | Status: DC
  Filled 2024-08-05: qty 300

## 2024-08-05 MED ORDER — TRAMADOL HCL 50 MG PO TABS
50.0000 mg | ORAL_TABLET | Freq: Four times a day (QID) | ORAL | Status: DC | PRN
  Administered 2024-08-05 – 2024-08-08 (×5): 50 mg via ORAL
  Filled 2024-08-05 (×6): qty 1

## 2024-08-05 MED ORDER — CHLORHEXIDINE GLUCONATE CLOTH 2 % EX PADS
6.0000 | MEDICATED_PAD | Freq: Every day | CUTANEOUS | Status: DC
  Administered 2024-08-05 – 2024-08-07 (×3): 6 via TOPICAL

## 2024-08-05 NOTE — Progress Notes (Signed)
 PT Cancellation Note  Patient Details Name: FELISA ZECHMAN MRN: [id number] DOB: 07-Jun-1932   Cancelled Treatment:    Reason Eval/Treat Not Completed: Fatigue/lethargy limiting ability to participate;Patient declined patient sleeping, aroused to say that she is too weak. Will check back tommorrow.  Darice Potters PT Acute Rehabilitation Services Office [phone]    Potters Darice Norris 08/05/2024, 2:13 PM

## 2024-08-05 NOTE — Plan of Care (Signed)
   Problem: Clinical Measurements: Goal: Ability to maintain clinical measurements within normal limits will improve Outcome: Progressing Goal: Will remain free from infection Outcome: Progressing Goal: Diagnostic test results will improve Outcome: Progressing Goal: Respiratory complications will improve Outcome: Progressing Goal: Cardiovascular complication will be avoided Outcome: Progressing   Problem: Activity: Goal: Risk for activity intolerance will decrease Outcome: Progressing   Problem: Nutrition: Goal: Adequate nutrition will be maintained Outcome: Progressing   Problem: Coping: Goal: Level of anxiety will decrease Outcome: Progressing   Problem: Education: Goal: Knowledge of General Education information will improve Description: Including pain rating scale, medication(s)/side effects and non-pharmacologic comfort measures Outcome: Not Progressing   Problem: Health Behavior/Discharge Planning: Goal: Ability to manage health-related needs will improve Outcome: Not Progressing

## 2024-08-05 NOTE — Progress Notes (Signed)
 PROGRESS NOTE    Eileen Wilkerson  FMW:[id number] DOB: [birth year] DOA: 08/04/2024 PCP: Mast, [name], NP    Brief Narrative:  89 year old with history of COPD, asthma, chronic diastolic heart failure, rheumatoid arthritis on chronic prednisone  therapy, hypertension, hyperlipidemia presented with about 1 day of productive cough, shortness of breath and fever as well as generalized myalgia.  In the emergency room, temperature 101.2, heart rate 100.  Blood pressures low normal responded to 3 L of IV fluids.  White blood cell count 20.3 with 78% neutrophils.  COVID, RSV and influenza negative.  CT scan with new bilateral lower lobe consolidation consistent with multifocal pneumonia.  Admitted with broad-spectrum antibiotics.  Subjective: Patient seen and examined.  Initially she was quite lethargic however on further questioning, she was able to answer appropriately.  She tells me she is tired.  Denies any wheezing or chest tightness.  She was visibly coughing. Overnight there were some bradycardic events but heart rate was mostly more than 40.  Afebrile today.  Cultures negative so far.  Assessment & Plan:   COPD with acute exacerbation secondary to bilateral lower lobe pneumonia: Acute hypoxemic respiratory failure secondary to above. Severe sepsis present on admission, tachycardia, tachypnea and leukocytosis.  Agree with admission given severity of symptoms. Antibiotics to treat bacterial pneumonia, given immunocompromise status.  Patient is on vancomycin , cefepime  and azithromycin .  Cultures negative so far.  Continue for next 24 hours before de-escalating. Chest physiotherapy, incentive spirometry, deep breathing exercises, sputum induction, mucolytic's and bronchodilators. Sputum cultures, blood cultures, Legionella and streptococcal antigen. Supplemental oxygen to keep saturations more than 90%. High-dose steroids, continue today. Start mobilizing with PT OT.  Chronic diastolic heart  failure: Currently needing resuscitation.  Euvolemic today.  Continue gentle IV fluids.  Holding diuretics and antihypertensives.  Rheumatoid arthritis: On chronic prednisone  therapy.  Currently on high-dose Solu-Medrol .  Will gradually taper off.  Hyperlipidemia: On atorvastatin .  Continue.  Essential hypertension: Blood pressures low normal.  Holding amlodipine .  Goal of care: Multiple discussions about goal of care.  Patient comes with yellow DNR paper signed from her independent living facility. Discussed with daughter at the bedside.  Wanted to clarify the CODE STATUS.  Currently patient has chosen to be DNR with full scope of treatment. Gave information regarding MOST form, described different levels of DNR.  Patient was reviewed in details with her daughter and will revisit scope of treatment.    DVT prophylaxis: SCDs Start: 08/04/24 2030   Code Status: DNR with intervention Family Communication: Daughter at the bedside Disposition Plan: Status is: Inpatient Remains inpatient appropriate because: Severe systemic illness     Consultants:  None  Procedures:  None  Antimicrobials:  Vancomycin , cefepime , azithromycin  8/25 ---     Objective: Vitals:   08/05/24 0600 08/05/24 0630 08/05/24 0738 08/05/24 0800  BP: (!) 103/43 (!) 130/55  (!) 131/56  Pulse: (!) 51 74 65 78  Resp: 15 20 14 17   Temp:    98.3 F (36.8 C)  TempSrc:    Oral  SpO2: 95% 97% 96% 99%  Height:        Intake/Output Summary (Last 24 hours) at 08/05/2024 1112 Last data filed at 08/05/2024 0900 Gross per 24 hour  Intake 4790 ml  Output 800 ml  Net 3990 ml   There were no vitals filed for this visit.  Examination:  General exam: Appears calm and comfortable.  Frail and debilitated.  Chronically sick looking.  Currently not in any distress. She is  on 2 L oxygen. Respiratory system: Clear to auscultation.  Poor inspiratory effort.  Poor bilateral air entry mostly at bases.  Able to talk in  complete sentences. Cardiovascular system: S1 & S2 heard, RRR.  Gastrointestinal system: Soft.  Nontender.  Bowel sound present. Central nervous system: Alert and oriented. No focal neurological deficits.  Gross generalized weakness.    Data Reviewed: I have personally reviewed following labs and imaging studies  CBC: Recent Labs  Lab 08/04/24 1854 08/05/24 0303  WBC 20.3* 16.1*  NEUTROABS 15.8* 14.7*  HGB 11.6* 10.5*  HCT 37.6 34.9*  MCV 92.8 94.1  PLT 257 222   Basic Metabolic Panel: Recent Labs  Lab 08/04/24 1854 08/05/24 0303  NA 131* 139  K 4.0 4.3  CL 98 108  CO2 23 21*  GLUCOSE 137* 162*  BUN 18 16  CREATININE 1.49* 0.92  CALCIUM  8.7* 8.5*  MG 2.1 1.8  PHOS  --  3.0   GFR: Estimated Creatinine Clearance: 42 mL/min (by C-G formula based on SCr of 0.92 mg/dL). Liver Function Tests: Recent Labs  Lab 08/04/24 1854 08/05/24 0303  AST 25 25  ALT 17 15  ALKPHOS 89 74  BILITOT 1.2 0.7  PROT 7.2 6.3*  ALBUMIN  3.2* 2.5*   No results for input(s): LIPASE, AMYLASE in the last 168 hours. No results for input(s): AMMONIA in the last 168 hours. Coagulation Profile: Recent Labs  Lab 08/04/24 1854  INR 1.2   Cardiac Enzymes: Recent Labs  Lab 08/04/24 1854  CKTOTAL 69   BNP (last 3 results) No results for input(s): PROBNP in the last 8760 hours. HbA1C: No results for input(s): HGBA1C in the last 72 hours. CBG: No results for input(s): GLUCAP in the last 168 hours. Lipid Profile: No results for input(s): CHOL, HDL, LDLCALC, TRIG, CHOLHDL, LDLDIRECT in the last 72 hours. Thyroid  Function Tests: Recent Labs    08/04/24 1854  TSH 1.777   Anemia Panel: No results for input(s): VITAMINB12, FOLATE, FERRITIN, TIBC, IRON, RETICCTPCT in the last 72 hours. Sepsis Labs: Recent Labs  Lab 08/04/24 1848 08/04/24 2247  LATICACIDVEN 1.9 1.5    Recent Results (from the past 240 hours)  Resp panel by RT-PCR (RSV, Flu A&B,  Covid) Anterior Nasal Swab     Status: None   Collection Time: 08/04/24  6:53 PM   Specimen: Anterior Nasal Swab  Result Value Ref Range Status   SARS Coronavirus 2 by RT PCR NEGATIVE NEGATIVE Final    Comment: (NOTE) SARS-CoV-2 target nucleic acids are NOT DETECTED.  The SARS-CoV-2 RNA is generally detectable in upper respiratory specimens during the acute phase of infection. The lowest concentration of SARS-CoV-2 viral copies this assay can detect is 138 copies/mL. A negative result does not preclude SARS-Cov-2 infection and should not be used as the sole basis for treatment or other patient management decisions. A negative result may occur with  improper specimen collection/handling, submission of specimen other than nasopharyngeal swab, presence of viral mutation(s) within the areas targeted by this assay, and inadequate number of viral copies(<138 copies/mL). A negative result must be combined with clinical observations, patient history, and epidemiological information. The expected result is Negative.  Fact Sheet for Patients:  BloggerCourse.com  Fact Sheet for Healthcare Providers:  SeriousBroker.it  This test is no t yet approved or cleared by the [place]  FDA and  has been authorized for detection and/or diagnosis of SARS-CoV-2 by FDA under an Emergency Use Authorization (EUA). This EUA will remain  in effect (  meaning this test can be used) for the duration of the COVID-19 declaration under Section 564(b)(1) of the Act, 21 U.S.C.section 360bbb-3(b)(1), unless the authorization is terminated  or revoked sooner.       Influenza A by PCR NEGATIVE NEGATIVE Final   Influenza B by PCR NEGATIVE NEGATIVE Final    Comment: (NOTE) The Xpert Xpress SARS-CoV-2/FLU/RSV plus assay is intended as an aid in the diagnosis of influenza from Nasopharyngeal swab specimens and should not be used as a sole basis for treatment. Nasal  washings and aspirates are unacceptable for Xpert Xpress SARS-CoV-2/FLU/RSV testing.  Fact Sheet for Patients: BloggerCourse.com  Fact Sheet for Healthcare Providers: SeriousBroker.it  This test is not yet approved or cleared by the [place]  FDA and has been authorized for detection and/or diagnosis of SARS-CoV-2 by FDA under an Emergency Use Authorization (EUA). This EUA will remain in effect (meaning this test can be used) for the duration of the COVID-19 declaration under Section 564(b)(1) of the Act, 21 U.S.C. section 360bbb-3(b)(1), unless the authorization is terminated or revoked.     Resp Syncytial Virus by PCR NEGATIVE NEGATIVE Final    Comment: (NOTE) Fact Sheet for Patients: BloggerCourse.com  Fact Sheet for Healthcare Providers: SeriousBroker.it  This test is not yet approved or cleared by the [place]  FDA and has been authorized for detection and/or diagnosis of SARS-CoV-2 by FDA under an Emergency Use Authorization (EUA). This EUA will remain in effect (meaning this test can be used) for the duration of the COVID-19 declaration under Section 564(b)(1) of the Act, 21 U.S.C. section 360bbb-3(b)(1), unless the authorization is terminated or revoked.  Performed at [facility], [address] [place], [place] [address]   Blood Culture (routine x 2)     Status: None (Preliminary result)   Collection Time: 08/04/24  6:57 PM   Specimen: BLOOD  Result Value Ref Range Status   Specimen Description   Final    BLOOD LEFT ANTECUBITAL Performed at [facility], [address] [place], [place] [address]    Special Requests   Final    BOTTLES DRAWN AEROBIC AND ANAEROBIC Blood Culture adequate volume Performed at [facility], [address] [place], [place] [address]    Culture   Final    NO GROWTH <  12 HOURS Performed at [facility], [address], [place], [place] [address]    Report Status PENDING  Incomplete  Blood Culture (routine x 2)     Status: None (Preliminary result)   Collection Time: 08/04/24  7:00 PM   Specimen: BLOOD  Result Value Ref Range Status   Specimen Description   Final    BLOOD BLOOD LEFT HAND Performed at [facility], [address], [place], [place] [address]    Special Requests   Final    BOTTLES DRAWN AEROBIC ONLY Blood Culture adequate volume Performed at [facility], [address] [place], [place] [address]    Culture   Final    NO GROWTH < 12 HOURS Performed at [facility], [address], [place], [place] [address]    Report Status PENDING  Incomplete  MRSA Next Gen by PCR, Nasal     Status: None   Collection Time: 08/04/24 11:42 PM   Specimen: Nasal Mucosa; Nasal Swab  Result Value Ref Range Status   MRSA by PCR Next Gen NOT DETECTED NOT DETECTED Final    Comment: (NOTE) The  GeneXpert MRSA Assay (FDA approved for NASAL specimens only), is one component of a comprehensive MRSA colonization surveillance program. It is not intended to diagnose MRSA infection nor to guide or monitor treatment for MRSA infections. Test performance is not FDA approved in patients less than 15 years old. Performed at [facility], [address] [place], [place] [address]   Expectorated Sputum Assessment w Gram Stain, Rflx to Resp Cult     Status: None   Collection Time: 08/05/24  8:14 AM   Specimen: Expectorated Sputum  Result Value Ref Range Status   Specimen Description EXPECTORATED SPUTUM  Final   Special Requests Immunocompromised  Final   Sputum evaluation   Final    Sputum specimen not acceptable for testing.  Please recollect.   GARCIA,K RN NOTIFIED Performed at [facility], [address] [place], [place] [address]    Report Status 08/05/2024 FINAL   Final         Radiology Studies: CT Angio Chest Pulmonary Embolism (PE) W or WO Contrast Result Date: 08/05/2024 CLINICAL DATA:  Fever and shortness of breath for 1 day EXAM: CT ANGIOGRAPHY CHEST WITH CONTRAST TECHNIQUE: Multidetector CT imaging of the chest was performed using the standard protocol during bolus administration of intravenous contrast. Multiplanar CT image reconstructions and MIPs were obtained to evaluate the vascular anatomy. RADIATION DOSE REDUCTION: This exam was performed according to the departmental dose-optimization program which includes automated exposure control, adjustment of the mA and/or kV according to patient size and/or use of iterative reconstruction technique. CONTRAST:  60mL OMNIPAQUE  IOHEXOL  350 MG/ML SOLN COMPARISON:  Chest x-ray from the previous day. FINDINGS: Cardiovascular: Thoracic aorta shows atherosclerotic calcifications without aneurysmal dilatation. No cardiac enlargement is noted. The pulmonary artery shows a normal branching pattern bilaterally. No filling defect to suggest pulmonary embolism is noted. Mild coronary calcifications are noted. Mediastinum/Nodes: Thoracic inlet is within normal limits. No hilar or mediastinal adenopathy is noted. Scattered small mediastinal nodes are seen. The esophagus as visualized is within normal limits. Lungs/Pleura: Lungs are well aerated bilaterally. New bilateral lower lobe consolidation is noted likely representing acute pneumonia. Scattered multiple pulmonary nodules are identified bilaterally similar to that seen on prior exam. Index nodule on the right lies in the upper lobe and measures approximately 10 mm stable in appearance. This is best seen on image number 67 of series 6. Index nodule on the left lies adjacent to the mediastinum with some apparent cavitation. This measures up to 17 mm also stable from the prior exam. Upper Abdomen: Visualized upper abdomen is unremarkable. Musculoskeletal: No chest wall  abnormality. No acute or significant osseous findings. Review of the MIP images confirms the above findings. IMPRESSION: No evidence of pulmonary embolism. New bilateral lower lobe consolidation consistent with multifocal pneumonia. Multiple pulmonary nodules which appears stable from the prior exam. Although metastatic disease deserves consideration these have been present for several previous exams suggesting a more benign etiology. Electronically Signed   By: Oneil Devonshire M.D.   On: 08/05/2024 01:00   DG Chest Port 1 View Result Date: 08/04/2024 CLINICAL DATA:  Questionable sepsis - evaluate for abnormality EXAM: PORTABLE CHEST 1 VIEW COMPARISON:  Chest x-ray 12/21/2023 FINDINGS: The heart and mediastinal contours are unchanged. Atherosclerotic plaque. No focal consolidation. No pulmonary edema. No pleural effusion. No pneumothorax. No acute osseous abnormality.  Cervical spine surgical hardware. IMPRESSION: 1. No active disease. 2.  Aortic Atherosclerosis (ICD10-I70.0). Electronically Signed   By: Morgane  Naveau M.D.  On: 08/04/2024 19:55        Scheduled Meds:  atorvastatin   20 mg Oral Q1200   Chlorhexidine  Gluconate Cloth  6 each Topical Daily   guaiFENesin   600 mg Oral BID   ipratropium-albuterol   3 mL Nebulization TID   loratadine   10 mg Oral Daily   methylPREDNISolone  (SOLU-MEDROL ) injection  80 mg Intravenous Q12H   montelukast   10 mg Oral QHS   Continuous Infusions:  azithromycin      ceFEPime  (MAXIPIME ) IV     lactated ringers  150 mL/hr at 08/05/24 1040   [START ON 08/06/2024] vancomycin        LOS: 1 day    Time spent: 55 minutes    Renato Applebaum, MD [facility]

## 2024-08-05 NOTE — Progress Notes (Signed)
 PHARMACY NOTE:  ANTIMICROBIAL RENAL DOSAGE ADJUSTMENT  Current antimicrobial regimen includes a mismatch between antimicrobial dosage and estimated renal function.  As per policy approved by the Pharmacy & Therapeutics and Medical Executive Committees, the antimicrobial dosage will be adjusted accordingly.  Current antimicrobial dosage: vancomycin  1250mg  IV q48hrs  Indication: sepsis  Renal Function:  Estimated Creatinine Clearance: 42 mL/min (by C-G formula based on SCr of 0.92 mg/dL). []      On intermittent HD, scheduled: []      On CRRT    Antimicrobial dosage has been changed to: vancomycin  1500mg  IV q36hrs (eAUC 523 using Scr 0.92)  Additional comments: N/A   Thank you for allowing pharmacy to be a part of this patient's care.  Lacinda Moats, PharmD Clinical Pharmacist  8/26/20252:50 PM

## 2024-08-06 DIAGNOSIS — J9601 Acute respiratory failure with hypoxia: Secondary | ICD-10-CM | POA: Diagnosis not present

## 2024-08-06 LAB — LEGIONELLA PNEUMOPHILA SEROGP 1 UR AG: L. pneumophila Serogp 1 Ur Ag: NEGATIVE

## 2024-08-06 LAB — CBC WITH DIFFERENTIAL/PLATELET
Abs Immature Granulocytes: 0.09 K/uL — ABNORMAL HIGH (ref 0.00–0.07)
Basophils Absolute: 0 K/uL (ref 0.0–0.1)
Basophils Relative: 0 %
Eosinophils Absolute: 0 K/uL (ref 0.0–0.5)
Eosinophils Relative: 0 %
HCT: 30.5 % — ABNORMAL LOW (ref 36.0–46.0)
Hemoglobin: 8.9 g/dL — ABNORMAL LOW (ref 12.0–15.0)
Immature Granulocytes: 1 %
Lymphocytes Relative: 4 %
Lymphs Abs: 0.7 K/uL (ref 0.7–4.0)
MCH: 27.5 pg (ref 26.0–34.0)
MCHC: 29.2 g/dL — ABNORMAL LOW (ref 30.0–36.0)
MCV: 94.1 fL (ref 80.0–100.0)
Monocytes Absolute: 0.6 K/uL (ref 0.1–1.0)
Monocytes Relative: 3 %
Neutro Abs: 16.9 K/uL — ABNORMAL HIGH (ref 1.7–7.7)
Neutrophils Relative %: 92 %
Platelets: 214 K/uL (ref 150–400)
RBC: 3.24 MIL/uL — ABNORMAL LOW (ref 3.87–5.11)
RDW: 16.4 % — ABNORMAL HIGH (ref 11.5–15.5)
WBC: 18.3 K/uL — ABNORMAL HIGH (ref 4.0–10.5)
nRBC: 0 % (ref 0.0–0.2)

## 2024-08-06 LAB — BASIC METABOLIC PANEL WITH GFR
Anion gap: 13 (ref 5–15)
BUN: 21 mg/dL (ref 8–23)
CO2: 19 mmol/L — ABNORMAL LOW (ref 22–32)
Calcium: 8.5 mg/dL — ABNORMAL LOW (ref 8.9–10.3)
Chloride: 109 mmol/L (ref 98–111)
Creatinine, Ser: 0.98 mg/dL (ref 0.44–1.00)
GFR, Estimated: 55 mL/min — ABNORMAL LOW (ref >60)
Glucose, Bld: 178 mg/dL — ABNORMAL HIGH (ref 70–99)
Potassium: 3.6 mmol/L (ref 3.5–5.1)
Sodium: 141 mmol/L (ref 135–145)

## 2024-08-06 LAB — MAGNESIUM: Magnesium: 2.1 mg/dL (ref 1.7–2.4)

## 2024-08-06 MED ORDER — METHYLPREDNISOLONE SODIUM SUCC 125 MG IJ SOLR
80.0000 mg | Freq: Every day | INTRAMUSCULAR | Status: DC
  Administered 2024-08-06: 80 mg via INTRAVENOUS
  Filled 2024-08-06: qty 2

## 2024-08-06 NOTE — Evaluation (Signed)
 Occupational Therapy Evaluation Patient Details Name: Eileen Wilkerson MRN: [id number] DOB: 06/25/1932 Today's Date: 08/06/2024   History of Present Illness   89 year old female who  presented with productive cough, shortness of breath, fever, generalized myalgia. CT scan with new bilateral lower lobe consolidation consistent with multifocal pneumonia. PMH: COPD, asthma, chronic diastolic heart failure, rheumatoid arthritis on chronic prednisone  therapy, hypertension, hyperlipidemia     Clinical Impressions The pt is currently presenting below her baseline level of functioning for self-care management. She is limited by the below listed deficits (see OT problem list). During the session, she required min assist for supine to sit and mod assist x2 to stand-pivot to the bedside chair using a RW. She was also noted to be with general deconditioning, unsteadiness in standing, and compromised endurance. She will benefit from further OT services to maximize her independence with self-care tasks and to decrease the risk for further weakness and deconditioning. Patient will benefit from continued inpatient follow up therapy, <3 hours/day.      If plan is discharge home, recommend the following:   A lot of help with bathing/dressing/bathroom;A lot of help with walking and/or transfers;Assist for transportation;Assistance with cooking/housework     Functional Status Assessment         Equipment Recommendations   Other (comment) (defer to next setting)     Recommendations for Other Services         Precautions/Restrictions   Precautions Precautions: Fall Restrictions Weight Bearing Restrictions Per Provider Order: No     Mobility Bed Mobility Overal bed mobility: Needs Assistance Bed Mobility: Supine to Sit     Supine to sit: Min assist, HOB elevated, Used rails          Transfers Overall transfer level: Needs assistance Equipment used: Rolling walker (2  wheels) Transfers: Sit to/from Stand, Bed to chair/wheelchair/BSC Sit to Stand: Mod assist, +2 physical assistance Stand pivot transfers: Mod assist, +2 physical assistance                Balance       Sitting balance - Comments: static sitting-good. dynamic sitting-fair+     Standing balance-Leahy Scale: Poor              ADL either performed or assessed with clinical judgement   ADL Overall ADL's : Needs assistance/impaired Eating/Feeding: Independent;Sitting   Grooming: Set up;Supervision/safety;Sitting           Upper Body Dressing : Set up;Supervision/safety;Sitting   Lower Body Dressing: Moderate assistance;Sitting/lateral leans;Sit to/from stand   Toilet Transfer: Moderate assistance;BSC/3in1;Rolling walker (2 wheels);Stand-pivot   Toileting- Architect and Hygiene: Moderate assistance;Sit to/from stand Toileting - Clothing Manipulation Details (indicate cue type and reason): at bedside commode level, based on clinical judgement             Vision   Additional Comments: She correctly read the time depicted on the wall clock.            Pertinent Vitals/Pain Pain Assessment Pain Assessment: No/denies pain     Extremity/Trunk Assessment Upper Extremity Assessment Upper Extremity Assessment: Right hand dominant;RUE deficits/detail;LUE deficits/detail RUE Deficits / Details: AROM WFL. Chronic arthritic changes of hands. Grip strength 4/5 LUE Deficits / Details: AROM WFL. Chronic arthritic changes of hands. Grip strength 4/5   Lower Extremity Assessment Lower Extremity Assessment: Generalized weakness;RLE deficits/detail;LLE deficits/detail RLE Deficits / Details: AROM WFL LLE Deficits / Details: AROM WFL       Communication Communication Communication: No apparent difficulties  Cognition Arousal: Alert Behavior During Therapy: WFL for tasks assessed/performed               OT - Cognition Comments: Oriented x4                  Following commands: Intact       Cueing  General Comments   Cueing Techniques: Verbal cues              Home Living Family/patient expects to be discharged to:: Assisted living [facility])        Home Equipment: Rollator (4 wheels);Wheelchair - power          Prior Functioning/Environment Prior Level of Function : Independent/Modified Independent;Needs assist             Mobility Comments:  (She used a rollator for ambulation at the facility, however used a power wheelchair for longer distances.) ADLs Comments:  (She was modified independent to independent with ADLs, except for having supervision for showering.)    OT Problem List: Decreased strength;Decreased activity tolerance;Impaired balance (sitting and/or standing)   OT Treatment/Interventions: Self-care/ADL training;Therapeutic exercise;Patient/family education;Balance training;Therapeutic activities;Energy conservation;DME and/or AE instruction      OT Goals(Current goals can be found in the care plan section)   Acute Rehab OT Goals OT Goal Formulation: With patient Time For Goal Achievement: 08/20/24 Potential to Achieve Goals: Good ADL Goals Pt Will Perform Lower Body Dressing: with supervision;sitting/lateral leans;sit to/from stand Pt Will Transfer to Toilet: with supervision;ambulating Pt Will Perform Toileting - Clothing Manipulation and hygiene: with supervision;sit to/from stand   OT Frequency:  Min 2X/week    Co-evaluation PT/OT/SLP Co-Evaluation/Treatment: Yes Reason for Co-Treatment: For patient/therapist safety;To address functional/ADL transfers PT goals addressed during session: Mobility/safety with mobility OT goals addressed during session: ADL's and self-care      AM-PAC OT 6 Clicks Daily Activity     Outcome Measure Help from another person eating meals?: None Help from another person taking care of personal grooming?: A Little Help from another person  toileting, which includes using toliet, bedpan, or urinal?: A Lot Help from another person bathing (including washing, rinsing, drying)?: A Lot Help from another person to put on and taking off regular upper body clothing?: A Little Help from another person to put on and taking off regular lower body clothing?: A Lot 6 Click Score: 16   End of Session Equipment Utilized During Treatment: Rolling walker (2 wheels);Oxygen Nurse Communication: Mobility status  Activity Tolerance: Patient limited by fatigue Patient left: in chair;with call bell/phone within reach;with nursing/sitter in room  OT Visit Diagnosis: Unsteadiness on feet (R26.81);Muscle weakness (generalized) (M62.81);Other abnormalities of gait and mobility (R26.89)                Time: 1055-1110 OT Time Calculation (min): 15 min Charges:  OT General Charges $OT Visit: 1 Visit OT Evaluation $OT Eval Moderate Complexity: 1 Mod    Delanna JINNY Lesches, OTR/L 08/06/2024, 12:32 PM

## 2024-08-06 NOTE — TOC Initial Note (Signed)
 Transition of Care [facility]) - Initial/Assessment Note    Patient Details  Name: Eileen Wilkerson MRN: [id number] Date of Birth: 04-May-1932  Transition of Care [facility]) CM/SW Contact:    Jon ONEIDA Anon, RN Phone Number: 08/06/2024, 8:57 AM  Clinical Narrative:                 Pt is a current resident at [facility] ALF. NCM spoke with Eileen Wilkerson at [facility] and she confirms pt is a resident at the facility. Currently requiring O2, does not use oxygen at baseline. Per pt daughter Eileen Wilkerson, she uses a walker and motorized wheelchair for ambulation at baseline. States if pt to need SNF at DC, prefers pt go to [facility]. Pt will need PTAR for transportation. PT/OT has been consulted, awaiting any new recommendations. TOC will follow for any DC needs.     Expected Discharge Plan: Assisted Living Barriers to Discharge: Continued Medical Work up   Patient Goals and CMS Choice Patient states their goals for this hospitalization and ongoing recovery are:: To return to ALF at [facility] CMS Medicare.gov Compare Post Acute Care list provided to:: Patient Choice offered to / list presented to : Patient [place] ownership interest in [facility]provided to:: Patient    Expected Discharge Plan and Services In-house Referral: NA Discharge Planning Services: CM Consult Post Acute Care Choice: Durable Medical Equipment Living arrangements for the past 2 months: Assisted Living Facility                 DME Arranged: N/A DME Agency: NA       HH Arranged: NA HH Agency: NA        Prior Living Arrangements/Services Living arrangements for the past 2 months: Assisted Living Facility Lives with:: Facility Resident Patient language and need for interpreter reviewed:: Yes Do you feel safe going back to the place where you live?: Yes      Need for Family Participation in Patient Care: Yes (Comment) Care giver support system in place?: Yes (comment) Current  home services: DME Criminal Activity/Legal Involvement Pertinent to Current Situation/Hospitalization: No - Comment as needed  Activities of Daily Living   ADL Screening (condition at time of admission) Independently performs ADLs?: No Does the patient have a NEW difficulty with bathing/dressing/toileting/self-feeding that is expected to last >3 days?: No Does the patient have a NEW difficulty with getting in/out of bed, walking, or climbing stairs that is expected to last >3 days?: No Does the patient have a NEW difficulty with communication that is expected to last >3 days?: No Is the patient deaf or have difficulty hearing?: No Does the patient have difficulty seeing, even when wearing glasses/contacts?: No Does the patient have difficulty concentrating, remembering, or making decisions?: No  Permission Sought/Granted Permission sought to share information with : Family Supports, Magazine features editor Permission granted to share information with : Yes, Verbal Permission Granted  Share Information with NAME: Ranae Eileen Wilkerson (Daughter)  [phone]  Permission granted to share info w AGENCY: [facility]        Emotional Assessment Appearance:: Appears stated age Attitude/Demeanor/Rapport: Engaged Affect (typically observed): Accepting Orientation: : Oriented to Self, Oriented to Place, Oriented to  Time, Oriented to Situation Alcohol / Substance Use: Not Applicable Psych Involvement: No (comment)  Admission diagnosis:  Pneumonia due to infectious organism, unspecified laterality, unspecified part of lung [J18.9] Acute hypoxic respiratory failure (HCC) [J96.01] Patient Active Problem List   Diagnosis Date Noted  Acute hypoxic respiratory failure (HCC) 08/04/2024   COPD with acute exacerbation (HCC) 08/04/2024   AKI (acute kidney injury) (HCC) 08/04/2024   Acute hyponatremia 08/04/2024   Anemia of chronic disease 08/04/2024   Pulmonary nodules 03/19/2024    Cough 02/06/2024   URI (upper respiratory infection) 01/22/2024   Palliative care encounter 12/25/2023   Goals of care, counseling/discussion 12/25/2023   Counseling and coordination of care 12/25/2023   Acute gastritis 12/21/2023   Prediabetes 12/21/2023   History of diverticulitis 12/21/2023   Deep tissue injury 09/06/2023   Essential hypertension 08/07/2023   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   Acute metabolic encephalopathy 02/12/2023   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 02/12/2023   Urinary frequency 02/08/2023   Left hip pain 02/08/2023   Chronic diastolic CHF (congestive heart failure) (HCC) 11/06/2022   Gout 11/06/2022   Sepsis secondary to UTI (HCC) 10/27/2022   History of DVT (deep vein thrombosis) 05/26/2022   E coli bacteremia    Sepsis (HCC) 05/21/2022   Diarrhea 05/21/2022   Gait abnormality 01/26/2022   Sinus tachycardia 10/27/2021   Weight gain 10/14/2021   HLD (hyperlipidemia) 10/13/2021   Dysuria 06/23/2021   Osteopenia 06/07/2021   Adnexal cyst 03/03/2021   AK (actinic keratosis) 03/03/2021   UTI (urinary tract infection) 07/22/2020   Allergic rhinitis 07/22/2020   Asthma 07/08/2020   Perforation of sigmoid colon due to diverticulitis 06/23/2020   Hair loss 08/06/2019   SOB (shortness of breath) on exertion 08/06/2019   Pure hypercholesterolemia 08/06/2019   B12 deficiency 03/11/2018   CKD (chronic kidney disease) stage 3, GFR 30-59 ml/min (HCC) 02/14/2018   Severe sepsis (HCC) 02/03/2018   E. coli UTI 01/18/2018   Edema of both lower extremities due to peripheral venous insufficiency 09/18/2017   Bilateral lower extremity edema 06/30/2017   Physical deconditioning 05/30/2017   Immunosuppressed status (HCC) 05/30/2017   Abnormal chest x-ray 05/17/2017   CAP (community acquired pneumonia) 04/25/2017   Hypokalemia 04/25/2017   Fibromyalgia 12/18/2016   Spondylosis of lumbar region without myelopathy or  radiculopathy 12/18/2016   Trigger finger, right middle finger 12/18/2016   Primary osteoarthritis of both feet 12/18/2016   Primary osteoarthritis of both knees 12/18/2016   GERD (gastroesophageal reflux disease) 12/18/2016   Age-related osteoporosis without current pathological fracture 12/18/2016   Fever 12/28/2015   Rheumatoid arthritis (HCC) 12/28/2015   Long term current use of systemic steroids, for RA 12/28/2015   Leukocytosis 11/13/2012   PCP:  Mast, [name], NP Pharmacy:   [facility] [place], [place] - [address] [place] [address] Phone: [phone] Fax: [phone]     Social Drivers of Health (SDOH) Social History: SDOH Screenings   Food Insecurity: No Food Insecurity (12/21/2023)  Housing: Low Risk  (12/21/2023)  Transportation Needs: No Transportation Needs (12/21/2023)  Utilities: Not At Risk (12/21/2023)  Depression (PHQ2-9): Low Risk  (11/05/2023)  Tobacco Use: Medium Risk (08/04/2024)   SDOH Interventions:     Readmission Risk Interventions    08/06/2024    8:54 AM 05/30/2022   10:08 AM  Readmission Risk Prevention Plan  Transportation Screening Complete Complete  PCP or Specialist Appt within 5-7 Days Complete Complete  Home Care Screening Complete Complete  Medication Review (RN CM) Complete Complete

## 2024-08-06 NOTE — Progress Notes (Signed)
 PROGRESS NOTE    Eileen Wilkerson  FMW:[id number] DOB: [birth year] DOA: 08/04/2024 PCP: Mast, [name], NP    Brief Narrative:  89 year old with history of COPD, asthma, chronic diastolic heart failure, rheumatoid arthritis on chronic prednisone  therapy, hypertension, hyperlipidemia presented with about 1 day of productive cough, shortness of breath and fever as well as generalized myalgia.  In the emergency room, temperature 101.2, heart rate 100.  Blood pressures low normal responded to 3 L of IV fluids.  White blood cell count 20.3 with 78% neutrophils.  COVID, RSV and influenza negative.  CT scan with new bilateral lower lobe consolidation consistent with multifocal pneumonia.  Admitted with broad-spectrum antibiotics.  Subjective: Patient seen and examined.  Feels very weak and tired.  Denies any fever or chills.  Has a dry cough.  No other overnight events.  Telemetry monitor with sinus rhythm and occasionally heart rate 43 and above.  Cultures negative.  Assessment & Plan:   COPD with acute exacerbation secondary to bilateral lower lobe pneumonia: Acute hypoxemic respiratory failure secondary to above. Severe sepsis present on admission, tachycardia, tachypnea and leukocytosis.  Patient currently on vancomycin , cefepime  and azithromycin .  Cultures negative.  MRSA PCR negative.  Will discontinue vancomycin .  Continue cefepime  and azithromycin  today. Chest physiotherapy, incentive spirometry, deep breathing exercises, sputum induction, mucolytic's and bronchodilators. Sputum cultures, blood cultures, Legionella and streptococcal antigen.  Negative so far. Supplemental oxygen to keep saturations more than 90%. High-dose steroids, will start tapering off. Start mobilizing with PT OT.  Chronic diastolic heart failure: Currently needing resuscitation.  Euvolemic today.  Discontinue IV fluids.  Holding diuretics and antihypertensives.  Rheumatoid arthritis: On chronic prednisone  therapy.   Currently on high-dose Solu-Medrol .  Will gradually taper off.  Hyperlipidemia: On atorvastatin .  Continue.  Essential hypertension: Blood pressures low normal.  Holding amlodipine .  Goal of care: Patient comes with yellow DNR paper signed from her ALF Gave information regarding MOST form, described different levels of DNR.  Patient will review it , will fill out MOST form before discharge.     DVT prophylaxis: SCDs Start: 08/04/24 2030   Code Status: DNR with intervention Family Communication: None today. Disposition Plan: Status is: Inpatient Remains inpatient appropriate because: Severe systemic illness Patient can transfer to progressive bed from stepdown unit.     Consultants:  None  Procedures:  None  Antimicrobials:  Vancomycin , cefepime , azithromycin  8/25 ---     Objective: Vitals:   08/06/24 0611 08/06/24 0700 08/06/24 0800 08/06/24 0910  BP: (!) 116/41 (!) 116/46    Pulse: (!) 49 (!) 53    Resp: 15 14    Temp:   98.3 F (36.8 C)   TempSrc:   Oral   SpO2: 95% 96%  97%  Weight:      Height:        Intake/Output Summary (Last 24 hours) at 08/06/2024 1120 Last data filed at 08/06/2024 9375 Gross per 24 hour  Intake 1577.02 ml  Output 1550 ml  Net 27.02 ml   Filed Weights   08/04/24 2330 08/06/24 0500  Weight: 77.8 kg 83.7 kg    Examination:  General exam: Frail and debilitated.  Able to have normal conversation.  Able to talk in complete sentences. Respiratory system: Clear to auscultation.  Poor inspiratory effort.  Poor bilateral air entry mostly at bases.  Able to talk in complete sentences.  Dry cough present. Cardiovascular system: S1 & S2 heard, RRR.  Gastrointestinal system: Soft.  Nontender.  Bowel sound present.  Central nervous system: Alert and oriented. No focal neurological deficits.  Gross generalized weakness.    Data Reviewed: I have personally reviewed following labs and imaging studies  CBC: Recent Labs  Lab 08/04/24 1854  08/05/24 0303 08/06/24 0308  WBC 20.3* 16.1* 18.3*  NEUTROABS 15.8* 14.7* 16.9*  HGB 11.6* 10.5* 8.9*  HCT 37.6 34.9* 30.5*  MCV 92.8 94.1 94.1  PLT 257 222 214   Basic Metabolic Panel: Recent Labs  Lab 08/04/24 1854 08/05/24 0303 08/06/24 0308  NA 131* 139 141  K 4.0 4.3 3.6  CL 98 108 109  CO2 23 21* 19*  GLUCOSE 137* 162* 178*  BUN 18 16 21   CREATININE 1.49* 0.92 0.98  CALCIUM  8.7* 8.5* 8.5*  MG 2.1 1.8 2.1  PHOS  --  3.0  --    GFR: Estimated Creatinine Clearance: 41.6 mL/min (by C-G formula based on SCr of 0.98 mg/dL). Liver Function Tests: Recent Labs  Lab 08/04/24 1854 08/05/24 0303  AST 25 25  ALT 17 15  ALKPHOS 89 74  BILITOT 1.2 0.7  PROT 7.2 6.3*  ALBUMIN  3.2* 2.5*   No results for input(s): LIPASE, AMYLASE in the last 168 hours. No results for input(s): AMMONIA in the last 168 hours. Coagulation Profile: Recent Labs  Lab 08/04/24 1854  INR 1.2   Cardiac Enzymes: Recent Labs  Lab 08/04/24 1854  CKTOTAL 69   BNP (last 3 results) No results for input(s): PROBNP in the last 8760 hours. HbA1C: No results for input(s): HGBA1C in the last 72 hours. CBG: No results for input(s): GLUCAP in the last 168 hours. Lipid Profile: No results for input(s): CHOL, HDL, LDLCALC, TRIG, CHOLHDL, LDLDIRECT in the last 72 hours. Thyroid  Function Tests: Recent Labs    08/04/24 1854  TSH 1.777   Anemia Panel: No results for input(s): VITAMINB12, FOLATE, FERRITIN, TIBC, IRON, RETICCTPCT in the last 72 hours. Sepsis Labs: Recent Labs  Lab 08/04/24 1848 08/04/24 2247  LATICACIDVEN 1.9 1.5    Recent Results (from the past 240 hours)  Resp panel by RT-PCR (RSV, Flu A&B, Covid) Anterior Nasal Swab     Status: None   Collection Time: 08/04/24  6:53 PM   Specimen: Anterior Nasal Swab  Result Value Ref Range Status   SARS Coronavirus 2 by RT PCR NEGATIVE NEGATIVE Final    Comment: (NOTE) SARS-CoV-2 target nucleic acids  are NOT DETECTED.  The SARS-CoV-2 RNA is generally detectable in upper respiratory specimens during the acute phase of infection. The lowest concentration of SARS-CoV-2 viral copies this assay can detect is 138 copies/mL. A negative result does not preclude SARS-Cov-2 infection and should not be used as the sole basis for treatment or other patient management decisions. A negative result may occur with  improper specimen collection/handling, submission of specimen other than nasopharyngeal swab, presence of viral mutation(s) within the areas targeted by this assay, and inadequate number of viral copies(<138 copies/mL). A negative result must be combined with clinical observations, patient history, and epidemiological information. The expected result is Negative.  Fact Sheet for Patients:  BloggerCourse.com  Fact Sheet for Healthcare Providers:  SeriousBroker.it  This test is no t yet approved or cleared by the [place]  FDA and  has been authorized for detection and/or diagnosis of SARS-CoV-2 by FDA under an Emergency Use Authorization (EUA). This EUA will remain  in effect (meaning this test can be used) for the duration of the COVID-19 declaration under Section 564(b)(1) of the Act, 21 U.S.C.section 360bbb-3(b)(1), unless  the authorization is terminated  or revoked sooner.       Influenza A by PCR NEGATIVE NEGATIVE Final   Influenza B by PCR NEGATIVE NEGATIVE Final    Comment: (NOTE) The Xpert Xpress SARS-CoV-2/FLU/RSV plus assay is intended as an aid in the diagnosis of influenza from Nasopharyngeal swab specimens and should not be used as a sole basis for treatment. Nasal washings and aspirates are unacceptable for Xpert Xpress SARS-CoV-2/FLU/RSV testing.  Fact Sheet for Patients: BloggerCourse.com  Fact Sheet for Healthcare Providers: SeriousBroker.it  This test is  not yet approved or cleared by the [place]  FDA and has been authorized for detection and/or diagnosis of SARS-CoV-2 by FDA under an Emergency Use Authorization (EUA). This EUA will remain in effect (meaning this test can be used) for the duration of the COVID-19 declaration under Section 564(b)(1) of the Act, 21 U.S.C. section 360bbb-3(b)(1), unless the authorization is terminated or revoked.     Resp Syncytial Virus by PCR NEGATIVE NEGATIVE Final    Comment: (NOTE) Fact Sheet for Patients: BloggerCourse.com  Fact Sheet for Healthcare Providers: SeriousBroker.it  This test is not yet approved or cleared by the [place]  FDA and has been authorized for detection and/or diagnosis of SARS-CoV-2 by FDA under an Emergency Use Authorization (EUA). This EUA will remain in effect (meaning this test can be used) for the duration of the COVID-19 declaration under Section 564(b)(1) of the Act, 21 U.S.C. section 360bbb-3(b)(1), unless the authorization is terminated or revoked.  Performed at [facility], [address], [place], [place] [address]   Blood Culture (routine x 2)     Status: None (Preliminary result)   Collection Time: 08/04/24  6:57 PM   Specimen: BLOOD  Result Value Ref Range Status   Specimen Description   Final    BLOOD LEFT ANTECUBITAL Performed at [facility], [address], [place], [place] [address]    Special Requests   Final    BOTTLES DRAWN AEROBIC AND ANAEROBIC Blood Culture adequate volume Performed at [facility], [address] [place], [place] [address]    Culture   Final    NO GROWTH 2 DAYS Performed at [facility], [address] [place], [place] [address]    Report Status PENDING  Incomplete  Blood Culture (routine x 2)     Status: None (Preliminary result)   Collection Time: 08/04/24  7:00 PM   Specimen: BLOOD  Result  Value Ref Range Status   Specimen Description   Final    BLOOD BLOOD LEFT HAND Performed at [facility], [address], [place], [place] [address]    Special Requests   Final    BOTTLES DRAWN AEROBIC ONLY Blood Culture adequate volume Performed at [facility], [address], [place], [place] [address]    Culture   Final    NO GROWTH 2 DAYS Performed at [facility], [address], [place], [place] [address]    Report Status PENDING  Incomplete  MRSA Next Gen by PCR, Nasal     Status: None   Collection Time: 08/04/24 11:42 PM   Specimen: Nasal Mucosa; Nasal Swab  Result Value Ref Range Status   MRSA by PCR Next Gen NOT DETECTED NOT DETECTED Final    Comment: (NOTE) The GeneXpert MRSA Assay (FDA approved for NASAL specimens only), is one component of a comprehensive MRSA colonization surveillance program. It is not intended to diagnose  MRSA infection nor to guide or monitor treatment for MRSA infections. Test performance is not FDA approved in patients less than 20 years old. Performed at [facility], [address] [place], [place] [address]   Expectorated Sputum Assessment w Gram Stain, Rflx to Resp Cult     Status: None   Collection Time: 08/05/24  8:14 AM   Specimen: Expectorated Sputum  Result Value Ref Range Status   Specimen Description EXPECTORATED SPUTUM  Final   Special Requests Immunocompromised  Final   Sputum evaluation   Final    Sputum specimen not acceptable for testing.  Please recollect.   GARCIA,K RN NOTIFIED Performed at [facility], [address], [place], [place] [address]    Report Status 08/05/2024 FINAL  Final  Expectorated Sputum Assessment w Gram Stain, Rflx to Resp Cult     Status: None   Collection Time: 08/05/24  4:26 PM   Specimen: Expectorated Sputum  Result Value Ref Range Status   Specimen Description EXPSU  Final   Special Requests Immunocompromised   Final   Sputum evaluation   Final    Sputum specimen not acceptable for testing.  Please recollect.   Performed at [facility], [address] [place], [place] [address]    Report Status 08/05/2024 FINAL  Final         Radiology Studies: CT Angio Chest Pulmonary Embolism (PE) W or WO Contrast Result Date: 08/05/2024 CLINICAL DATA:  Fever and shortness of breath for 1 day EXAM: CT ANGIOGRAPHY CHEST WITH CONTRAST TECHNIQUE: Multidetector CT imaging of the chest was performed using the standard protocol during bolus administration of intravenous contrast. Multiplanar CT image reconstructions and MIPs were obtained to evaluate the vascular anatomy. RADIATION DOSE REDUCTION: This exam was performed according to the departmental dose-optimization program which includes automated exposure control, adjustment of the mA and/or kV according to patient size and/or use of iterative reconstruction technique. CONTRAST:  60mL OMNIPAQUE  IOHEXOL  350 MG/ML SOLN COMPARISON:  Chest x-ray from the previous day. FINDINGS: Cardiovascular: Thoracic aorta shows atherosclerotic calcifications without aneurysmal dilatation. No cardiac enlargement is noted. The pulmonary artery shows a normal branching pattern bilaterally. No filling defect to suggest pulmonary embolism is noted. Mild coronary calcifications are noted. Mediastinum/Nodes: Thoracic inlet is within normal limits. No hilar or mediastinal adenopathy is noted. Scattered small mediastinal nodes are seen. The esophagus as visualized is within normal limits. Lungs/Pleura: Lungs are well aerated bilaterally. New bilateral lower lobe consolidation is noted likely representing acute pneumonia. Scattered multiple pulmonary nodules are identified bilaterally similar to that seen on prior exam. Index nodule on the right lies in the upper lobe and measures approximately 10 mm stable in appearance. This is best seen on image number 67 of series 6. Index  nodule on the left lies adjacent to the mediastinum with some apparent cavitation. This measures up to 17 mm also stable from the prior exam. Upper Abdomen: Visualized upper abdomen is unremarkable. Musculoskeletal: No chest wall abnormality. No acute or significant osseous findings. Review of the MIP images confirms the above findings. IMPRESSION: No evidence of pulmonary embolism. New bilateral lower lobe consolidation consistent with multifocal pneumonia. Multiple pulmonary nodules which appears stable from the prior exam. Although metastatic disease deserves consideration these have been present for several previous exams suggesting a more benign etiology. Electronically Signed   By: Oneil Devonshire M.D.   On: 08/05/2024 01:00   DG Chest Port 1 View Result Date: 08/04/2024 CLINICAL DATA:  Questionable sepsis - evaluate for abnormality EXAM: PORTABLE CHEST 1 VIEW COMPARISON:  Chest x-ray 12/21/2023 FINDINGS: The heart and mediastinal contours are unchanged. Atherosclerotic plaque. No focal consolidation. No pulmonary edema. No pleural effusion. No pneumothorax. No acute osseous abnormality.  Cervical spine surgical hardware. IMPRESSION: 1. No active disease. 2.  Aortic Atherosclerosis (ICD10-I70.0). Electronically Signed   By: Morgane  Naveau M.D.   On: 08/04/2024 19:55        Scheduled Meds:  atorvastatin   20 mg Oral Q1200   Chlorhexidine  Gluconate Cloth  6 each Topical Daily   guaiFENesin   600 mg Oral BID   ipratropium-albuterol   3 mL Nebulization TID   loratadine   10 mg Oral Daily   methylPREDNISolone  (SOLU-MEDROL ) injection  80 mg Intravenous Daily   montelukast   10 mg Oral QHS   Continuous Infusions:  azithromycin  Stopped (08/05/24 2221)   ceFEPime  (MAXIPIME ) IV 2 g (08/06/24 1116)     LOS: 2 days    Time spent: 2 minutes    Renato Applebaum, MD [facility]

## 2024-08-06 NOTE — Progress Notes (Signed)
 Chaplain stopped in while rounding. I introduced myself and our services to pt Eileen Wilkerson, who expressed no needs at this time. Chaplains remain available.

## 2024-08-06 NOTE — Evaluation (Signed)
 Physical Therapy Evaluation Patient Details Name: Eileen Wilkerson MRN: [id number] DOB: [birth year] Today's Date: 08/06/2024  History of Present Illness  89 year old female presents to ed 08/04/24  presented with productive cough, shortness of breath and fever, generalized myalgia. CT scan with new bilateral lower lobe consolidation consistent with multifocal pneumonia. PMH: COPD, asthma, chronic diastolic heart failure, rheumatoid arthritis on chronic prednisone  therapy, hypertension, hyperlipidemia  Clinical Impression  Pt admitted with above diagnosis.  Pt currently with functional limitations due to the deficits listed below (see PT Problem List). Pt will benefit from acute skilled PT to increase their independence and safety with mobility to allow discharge.     The patient required encouragement to mobilize, stated that she is too weak. Patient required min assist for sitting and mod assist for standing at RW, only holding to RW with 1 hand due to holding towel for incontinence. Able to step to recliner. Patient remained on 2 L, Spo296% HR 81.  \Patient resides in ALF, using /rollator in apt or power chair for longer distances, has supervision for shower. Patient will benefit from continued inpatient follow up therapy, <3 hours/day      If plan is discharge home, recommend the following: A little help with walking and/or transfers;Help with stairs or ramp for entrance;A lot of help with bathing/dressing/bathroom   Can travel by private vehicle   No    Equipment Recommendations None recommended by PT  Recommendations for Other Services       Functional Status Assessment Patient has had a recent decline in their functional status and demonstrates the ability to make significant improvements in function in a reasonable and predictable amount of time.     Precautions / Restrictions Precautions Precautions: Fall Precaution/Restrictions Comments: incontinence Restrictions Weight  Bearing Restrictions Per Provider Order: No      Mobility  Bed Mobility Overal bed mobility: Needs Assistance       Supine to sit: Min assist, HOB elevated, Used rails          Transfers Overall transfer level: Needs assistance Equipment used: Rolling walker (2 wheels) Transfers: Sit to/from Stand, Bed to chair/wheelchair/BSC Sit to Stand: Mod assist, +2 physical assistance Stand pivot transfers: Mod assist, +2 physical assistance         General transfer comment: patient incontinent when standing, able to step to recliner, holding towel between legs    Ambulation/Gait                  Stairs            Wheelchair Mobility     Tilt Bed    Modified Rankin (Stroke Patients Only)       Balance Overall balance assessment: Needs assistance Sitting-balance support: Feet supported, Bilateral upper extremity supported Sitting balance-Leahy Scale: Fair Sitting balance - Comments: static sitting-good. dynamic sitting-fair+   Standing balance support: Reliant on assistive device for balance, During functional activity, Single extremity supported Standing balance-Leahy Scale: Poor                               Pertinent Vitals/Pain Pain Assessment Pain Assessment: No/denies pain Faces Pain Scale: (P) Hurts even more Pain Location: (P) all over Pain Descriptors / Indicators: (P) Aching Pain Intervention(s): (P) Monitored during session    Home Living Family/patient expects to be discharged to:: Assisted living                 Home  Equipment: Rollator (4 wheels);Wheelchair - power      Prior Function Prior Level of Function : Independent/Modified Independent;Needs assist             Mobility Comments: uses Rollator in apt, WC for laong distnaces ADLs Comments: has someone standby for shower, Independnet to dress     Extremity/Trunk Assessment   Upper Extremity Assessment Upper Extremity Assessment: (P) Defer to OT  evaluation RUE Deficits / Details: AROM WFL. Chronic arthritic changes of hands. Grip strength 4/5 LUE Deficits / Details: AROM WFL. Chronic arthritic changes of hands. Grip strength 4/5    Lower Extremity Assessment Lower Extremity Assessment: (P) Generalized weakness RLE Deficits / Details: AROM WFL LLE Deficits / Details: AROM WFL       Communication   Communication Communication: No apparent difficulties    Cognition Arousal: Alert Behavior During Therapy: WFL for tasks assessed/performed                             Following commands: Intact       Cueing Cueing Techniques: Verbal cues     General Comments      Exercises     Assessment/Plan    PT Assessment Patient needs continued PT services  PT Problem List Decreased strength;Decreased knowledge of use of DME;Decreased activity tolerance;Decreased safety awareness;Decreased mobility       PT Treatment Interventions DME instruction;Gait training;Functional mobility training;Therapeutic activities;Therapeutic exercise;Patient/family education    PT Goals (Current goals can be found in the Care Plan section)  Acute Rehab PT Goals Patient Stated Goal: none stated PT Goal Formulation: With patient Time For Goal Achievement: 08/20/24 Potential to Achieve Goals: Fair    Frequency Min 2X/week     Co-evaluation PT/OT/SLP Co-Evaluation/Treatment: Yes Reason for Co-Treatment: For patient/therapist safety;To address functional/ADL transfers PT goals addressed during session: Mobility/safety with mobility OT goals addressed during session: ADL's and self-care       AM-PAC PT 6 Clicks Mobility  Outcome Measure Help needed turning from your back to your side while in a flat bed without using bedrails?: A Little Help needed moving from lying on your back to sitting on the side of a flat bed without using bedrails?: A Little Help needed moving to and from a bed to a chair (including a wheelchair)?:  A Lot Help needed standing up from a chair using your arms (e.g., wheelchair or bedside chair)?: A Lot Help needed to walk in hospital room?: Total Help needed climbing 3-5 steps with a railing? : Total 6 Click Score: 12    End of Session   Activity Tolerance: Patient limited by fatigue Patient left: in chair;with call bell/phone within reach;with nursing/sitter in room Nurse Communication: Mobility status PT Visit Diagnosis: Unsteadiness on feet (R26.81)    Time: 8941-8889 PT Time Calculation (min) (ACUTE ONLY): 12 min   Charges:   PT Evaluation $PT Eval Low Complexity: 1 Low   PT General Charges $$ ACUTE PT VISIT: 1 Visit         Darice Potters PT Acute Rehabilitation Services Office [phone]   Potters Darice Norris 08/06/2024, 2:49 PM

## 2024-08-06 NOTE — Plan of Care (Signed)
  Problem: Nutrition: Goal: Adequate nutrition will be maintained Outcome: Progressing   Problem: Coping: Goal: Level of anxiety will decrease Outcome: Progressing   Problem: Pain Managment: Goal: General experience of comfort will improve and/or be controlled Outcome: Progressing   Problem: Safety: Goal: Ability to remain free from injury will improve Outcome: Progressing

## 2024-08-07 DIAGNOSIS — J9601 Acute respiratory failure with hypoxia: Secondary | ICD-10-CM | POA: Diagnosis not present

## 2024-08-07 LAB — CBC WITH DIFFERENTIAL/PLATELET
Abs Immature Granulocytes: 0.11 K/uL — ABNORMAL HIGH (ref 0.00–0.07)
Basophils Absolute: 0 K/uL (ref 0.0–0.1)
Basophils Relative: 0 %
Eosinophils Absolute: 0 K/uL (ref 0.0–0.5)
Eosinophils Relative: 0 %
HCT: 30.8 % — ABNORMAL LOW (ref 36.0–46.0)
Hemoglobin: 9.5 g/dL — ABNORMAL LOW (ref 12.0–15.0)
Immature Granulocytes: 1 %
Lymphocytes Relative: 5 %
Lymphs Abs: 1 K/uL (ref 0.7–4.0)
MCH: 28.9 pg (ref 26.0–34.0)
MCHC: 30.8 g/dL (ref 30.0–36.0)
MCV: 93.6 fL (ref 80.0–100.0)
Monocytes Absolute: 0.7 K/uL (ref 0.1–1.0)
Monocytes Relative: 4 %
Neutro Abs: 17.3 K/uL — ABNORMAL HIGH (ref 1.7–7.7)
Neutrophils Relative %: 90 %
Platelets: 228 K/uL (ref 150–400)
RBC: 3.29 MIL/uL — ABNORMAL LOW (ref 3.87–5.11)
RDW: 16.6 % — ABNORMAL HIGH (ref 11.5–15.5)
WBC: 19.2 K/uL — ABNORMAL HIGH (ref 4.0–10.5)
nRBC: 0 % (ref 0.0–0.2)

## 2024-08-07 MED ORDER — PREDNISONE 20 MG PO TABS
20.0000 mg | ORAL_TABLET | Freq: Every day | ORAL | Status: DC
  Administered 2024-08-07 – 2024-08-08 (×2): 20 mg via ORAL
  Filled 2024-08-07 (×2): qty 1

## 2024-08-07 MED ORDER — DOXYCYCLINE HYCLATE 100 MG PO TABS
100.0000 mg | ORAL_TABLET | Freq: Two times a day (BID) | ORAL | Status: DC
  Administered 2024-08-07 – 2024-08-08 (×3): 100 mg via ORAL
  Filled 2024-08-07 (×3): qty 1

## 2024-08-07 MED ORDER — AZITHROMYCIN 250 MG PO TABS
500.0000 mg | ORAL_TABLET | Freq: Every day | ORAL | Status: DC
  Administered 2024-08-07: 500 mg via ORAL
  Filled 2024-08-07: qty 2

## 2024-08-07 NOTE — NC FL2 (Signed)
 [place]  MEDICAID FL2 LEVEL OF CARE FORM     IDENTIFICATION  Patient Name: Eileen Wilkerson Birthdate: 06-02-1932 Sex: female Admission Date (Current Location): 08/04/2024  [facility] and [place] Number:  Producer, television/film/video and Address:  [facility],  [address] [place], [place] [address]      Provider Number: [id number]  Attending Physician Name and Address:  Raenelle Coria, MD  Relative Name and Phone Number:  Ranae Quale (Daughter)  [phone]    Current Level of Care: Hospital Recommended Level of Care: Skilled Nursing Facility Prior Approval Number:    Date Approved/Denied:   PASRR Number: [id number]  Discharge Plan: SNF    Current Diagnoses: Patient Active Problem List   Diagnosis Date Noted   Acute hypoxic respiratory failure (HCC) 08/04/2024   COPD with acute exacerbation (HCC) 08/04/2024   AKI (acute kidney injury) (HCC) 08/04/2024   Acute hyponatremia 08/04/2024   Anemia of chronic disease 08/04/2024   Pulmonary nodules 03/19/2024   Cough 02/06/2024   URI (upper respiratory infection) 01/22/2024   Palliative care encounter 12/25/2023   Goals of care, counseling/discussion 12/25/2023   Counseling and coordination of care 12/25/2023   Acute gastritis 12/21/2023   Prediabetes 12/21/2023   History of diverticulitis 12/21/2023   Deep tissue injury 09/06/2023   Essential hypertension 08/07/2023   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   Acute metabolic encephalopathy 02/12/2023   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 02/12/2023   Urinary frequency 02/08/2023   Left hip pain 02/08/2023   Chronic diastolic CHF (congestive heart failure) (HCC) 11/06/2022   Gout 11/06/2022   Sepsis secondary to UTI (HCC) 10/27/2022   History of DVT (deep vein thrombosis) 05/26/2022   E coli bacteremia    Sepsis (HCC) 05/21/2022   Diarrhea 05/21/2022   Gait abnormality 01/26/2022   Sinus tachycardia 10/27/2021    Weight gain 10/14/2021   HLD (hyperlipidemia) 10/13/2021   Dysuria 06/23/2021   Osteopenia 06/07/2021   Adnexal cyst 03/03/2021   AK (actinic keratosis) 03/03/2021   UTI (urinary tract infection) 07/22/2020   Allergic rhinitis 07/22/2020   Asthma 07/08/2020   Perforation of sigmoid colon due to diverticulitis 06/23/2020   Hair loss 08/06/2019   SOB (shortness of breath) on exertion 08/06/2019   Pure hypercholesterolemia 08/06/2019   B12 deficiency 03/11/2018   CKD (chronic kidney disease) stage 3, GFR 30-59 ml/min (HCC) 02/14/2018   Severe sepsis (HCC) 02/03/2018   E. 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 12/18/2016   Trigger finger, right middle finger 12/18/2016   Primary osteoarthritis of both feet 12/18/2016   Primary osteoarthritis of both knees 12/18/2016   GERD (gastroesophageal reflux disease) 12/18/2016   Age-related osteoporosis without current pathological fracture 12/18/2016   Fever 12/28/2015   Rheumatoid arthritis (HCC) 12/28/2015   Long term current use of systemic steroids, for RA 12/28/2015   Leukocytosis 11/13/2012    Orientation RESPIRATION BLADDER Height & Weight     Self, Time, Situation, Place  O2 (2L O2 ) Incontinent Weight: 83.7 kg Height:  5' 5 (165.1 cm)  BEHAVIORAL SYMPTOMS/MOOD NEUROLOGICAL BOWEL NUTRITION STATUS      Continent Diet (Regular)  AMBULATORY STATUS COMMUNICATION OF NEEDS Skin   Extensive Assist Verbally Normal                       Personal Care Assistance Level of Assistance  Bathing, Feeding, Dressing Bathing Assistance: Limited  assistance Feeding assistance: Limited assistance Dressing Assistance: Limited assistance     Functional Limitations Info  Sight, Hearing, Speech Sight Info: Adequate Hearing Info: Impaired Speech Info: Adequate    SPECIAL CARE FACTORS FREQUENCY  PT (By licensed PT), OT (By licensed OT)     PT Frequency: 5x/wk OT Frequency: 5x/wk            Contractures Contractures Info: Not present    Additional Factors Info  Code Status, Allergies Code Status Info: DNR Allergies Info: Adhesive (Tape), Celebrex (Celecoxib), Ciprofibrate, Cymbalta (Duloxetine Hcl), Gabitril (Tiagabine), Lyrica (Pregabalin), Neurontin (Gabapentin), Nexium  (Esomeprazole ), Nsaids, Shrimp (Shellfish Allergy), Azactam  (Aztreonam ), Azelastine  Hcl, Ciprofloxacin , Claritin  (Loratadine ), Methotrexate  Derivatives, Nasacort  (Triamcinolone ), Olopatadine , Other, Sulfamethizole, Zantac (Ranitidine Hcl), Claritin -d 12 Hour (Loratadine -pseudoephedrine Er), Keflex  (Cephalexin ), Penicillins, Sulfa Antibiotics           Current Medications (08/07/2024):  This is the current hospital active medication list Current Facility-Administered Medications  Medication Dose Route Frequency Provider Last Rate Last Admin   acetaminophen  (TYLENOL ) tablet 650 mg  650 mg Oral Q6H PRN Howerter, Justin B, DO   650 mg at 08/06/24 0137   Or   acetaminophen  (TYLENOL ) suppository 650 mg  650 mg Rectal Q6H PRN Howerter, Justin B, DO       albuterol  (PROVENTIL ) (2.5 MG/3ML) 0.083% nebulizer solution 2.5 mg  2.5 mg Nebulization Q4H PRN Howerter, Justin B, DO       atorvastatin  (LIPITOR) tablet 20 mg  20 mg Oral Q1200 Howerter, Justin B, DO   20 mg at 08/06/24 1113   azithromycin  (ZITHROMAX ) tablet 500 mg  500 mg Oral Daily Raenelle Coria, MD       benzonatate  (TESSALON ) capsule 200 mg  200 mg Oral TID PRN Howerter, Justin B, DO   200 mg at 08/07/24 0415   Chlorhexidine  Gluconate Cloth 2 % PADS 6 each  6 each Topical Daily Raenelle Coria, MD   6 each  at 08/06/24 1114   doxycycline  (VIBRA -TABS) tablet 100 mg  100 mg Oral Q12H Ghimire, Kuber, MD       guaiFENesin  (MUCINEX ) 12 hr tablet 600 mg  600 mg Oral BID Howerter, Justin B, DO   600 mg at 08/06/24 2118   ipratropium-albuterol  (DUONEB) 0.5-2.5 (3) MG/3ML nebulizer solution 3 mL  3 mL Nebulization TID Howerter, Justin B, DO   3 mL at 08/06/24 1940   loratadine  (CLARITIN ) tablet 10 mg  10 mg Oral Daily Howerter, Justin B, DO   10 mg at 08/06/24 1114   melatonin tablet 3 mg  3 mg Oral QHS PRN Howerter, Justin B, DO   3 mg at 08/06/24 2118   montelukast  (SINGULAIR ) tablet 10 mg  10 mg Oral QHS Howerter, Justin B, DO   10 mg at 08/06/24 2118   ondansetron  (ZOFRAN ) injection 4 mg  4 mg Intravenous Q6H PRN Howerter, Justin B, DO       Oral care mouth rinse  15 mL Mouth Rinse PRN Raenelle Coria, MD       traMADol  (ULTRAM ) tablet 50 mg  50 mg Oral Q6H PRN Ghimire, Kuber, MD   50 mg at 08/07/24 0415     Discharge Medications: Please see discharge summary for a list of discharge  medications.  Relevant Imaging Results:  Relevant Lab Results:   Additional Information SSN: [id number]  Jon ONEIDA Anon, RN

## 2024-08-07 NOTE — TOC Progression Note (Addendum)
 Transition of Care [facility]) - Progression Note    Patient Details  Name: Eileen Wilkerson MRN: [id number] Date of Birth: [birth year]  Transition of Care [facility]) CM/SW Contact  Jon ONEIDA Anon, RN Phone Number: 08/07/2024, 11:48 AM  Clinical Narrative:    PT/OT evaluated pt and recommend STR at a SNF. Pt and daughter agreeable to recommendation. NCM spoke with Lonell at [facility] and pt has a ready bed at [place]. Insurance auth started, awaiting approval. Once pt is ready to DC, RN can call report to [phone] to room 40 B. IP Care Management is continuing to follow.  Addendum: 8395: Insurance shara is approved for pt to go to SNF at [facility]. Pt will be able DC to SNF tomorrow.        Expected Discharge Plan: Skilled Nursing Facility Barriers to Discharge: Continued Medical Work up               Expected Discharge Plan and Services In-house Referral: NA Discharge Planning Services: CM Consult Post Acute Care Choice: Durable Medical Equipment Living arrangements for the past 2 months: Assisted Living Facility                 DME Arranged: N/A DME Agency: NA       HH Arranged: NA HH Agency: NA         Social Drivers of Health (SDOH) Interventions SDOH Screenings   Food Insecurity: No Food Insecurity (12/21/2023)  Housing: Low Risk  (12/21/2023)  Transportation Needs: No Transportation Needs (12/21/2023)  Utilities: Not At Risk (12/21/2023)  Depression (PHQ2-9): Low Risk  (11/05/2023)  Tobacco Use: Medium Risk (08/04/2024)    Readmission Risk Interventions    08/06/2024    8:54 AM 05/30/2022   10:08 AM  Readmission Risk Prevention Plan  Transportation Screening Complete Complete  PCP or Specialist Appt within 5-7 Days Complete Complete  Home Care Screening Complete Complete  Medication Review (RN CM) Complete Complete

## 2024-08-07 NOTE — Plan of Care (Signed)

## 2024-08-07 NOTE — Plan of Care (Signed)

## 2024-08-07 NOTE — Progress Notes (Addendum)
 Physical Therapy Treatment Patient Details Name: Eileen Wilkerson MRN: [id number] DOB: 1932-04-02 Today's Date: 08/07/2024   History of Present Illness 89 year old female presents to ed 08/04/24  presented with productive cough, shortness of breath and fever, generalized myalgia. CT scan with new bilateral lower lobe consolidation consistent with multifocal pneumonia. PMH: COPD, asthma, chronic diastolic heart failure, rheumatoid arthritis on chronic prednisone  therapy, hypertension, hyperlipidemia    PT Comments  Patient sleeping but arouses easily. Patient agreeable to mobility and ambulated x 40' with Rw and min assistance. Patient resides in ALF, recommend  SNF level prior to return to ALF.SABRA  Patient's BP 165/73, HR 51, spo2 on RA 97%   If plan is discharge home, recommend the following: A little help with walking and/or transfers;Help with stairs or ramp for entrance;A lot of help with bathing/dressing/bathroom   Can travel by private vehicle     No  Equipment Recommendations  None recommended by PT    Recommendations for Other Services       Precautions / Restrictions Precautions Precautions: Fall Precaution/Restrictions Comments: incontinence Restrictions Weight Bearing Restrictions Per Provider Order: No     Mobility  Bed Mobility Overal bed mobility: Needs Assistance Bed Mobility: Supine to Sit     Supine to sit: Min assist     General bed mobility comments: assist with trunk, pulling up with arm hold    Transfers Overall transfer level: Needs assistance Equipment used: Rolling walker (2 wheels) Transfers: Sit to/from Stand Sit to Stand: Min assist, +2 safety/equipment           General transfer comment: patient incontinent, pad in place, still  leaks urine.    Ambulation/Gait Ambulation/Gait assistance: Min assist, +2 safety/equipment Gait Distance (Feet): 40 Feet Assistive device: Rolling walker (2 wheels) Gait Pattern/deviations: Step-to pattern,  Step-through pattern Gait velocity: decr     General Gait Details: assist with turns,   Stairs             Wheelchair Mobility     Tilt Bed    Modified Rankin (Stroke Patients Only)       Balance Overall balance assessment: Needs assistance Sitting-balance support: Feet supported, Bilateral upper extremity supported Sitting balance-Leahy Scale: Fair Sitting balance - Comments: static sitting-good. dynamic sitting-fair+   Standing balance support: Reliant on assistive device for balance, During functional activity, Single extremity supported Standing balance-Leahy Scale: Poor                              Communication Communication Communication: No apparent difficulties  Cognition Arousal: Alert Behavior During Therapy: WFL for tasks assessed/performed   PT - Cognitive impairments: No apparent impairments                         Following commands: Intact      Cueing Cueing Techniques: Verbal cues  Exercises      General Comments        Pertinent Vitals/Pain Pain Assessment Pain Assessment: No/denies pain    Home Living                          Prior Function            PT Goals (current goals can now be found in the care plan section) Progress towards PT goals: Progressing toward goals    Frequency    Min 2X/week  PT Plan      Co-evaluation              AM-PAC PT 6 Clicks Mobility   Outcome Measure  Help needed turning from your back to your side while in a flat bed without using bedrails?: A Little Help needed moving from lying on your back to sitting on the side of a flat bed without using bedrails?: A Little Help needed moving to and from a bed to a chair (including a wheelchair)?: A Little Help needed standing up from a chair using your arms (e.g., wheelchair or bedside chair)?: A Little Help needed to walk in hospital room?: A Little Help needed climbing 3-5 steps with a railing?  : Total 6 Click Score: 16    End of Session Equipment Utilized During Treatment: Gait belt Activity Tolerance: Patient tolerated treatment well Patient left: in chair;with call bell/phone within reach;with chair alarm set Nurse Communication: Mobility status PT Visit Diagnosis: Unsteadiness on feet (R26.81)     Time: 8874-8843 PT Time Calculation (min) (ACUTE ONLY): 31 min  Charges:    $Gait Training: 23-37 mins PT General Charges $$ ACUTE PT VISIT: 1 Visit                     Darice Potters PT Acute Rehabilitation Services Office [phone]    Potters Darice Norris 08/07/2024, 2:19 PM

## 2024-08-07 NOTE — Progress Notes (Signed)
 PROGRESS NOTE    Eileen Wilkerson  FMW:[id number] DOB: Jul 07, 1932 DOA: 08/04/2024 PCP: Mast, [name], NP    Brief Narrative:  89 year old with history of COPD, asthma, chronic diastolic heart failure, rheumatoid arthritis on chronic prednisone  therapy, hypertension, hyperlipidemia presented with about 1 day of productive cough, shortness of breath and fever as well as generalized myalgia.  In the emergency room, temperature 101.2, heart rate 100.  Blood pressures low normal responded to 3 L of IV fluids.  White blood cell count 20.3 with 78% neutrophils.  COVID, RSV and influenza negative.  CT scan with new bilateral lower lobe consolidation consistent with multifocal pneumonia.  Admitted with broad-spectrum antibiotics.  Subjective:  Patient seen and examined.  Today she feels much better.  She still has some dry cough.  Afebrile.  Assessment & Plan:   COPD with acute exacerbation secondary to bilateral lower lobe pneumonia: Acute hypoxemic respiratory failure secondary to above. Severe sepsis present on admission, tachycardia, tachypnea and leukocytosis.  Patient was treated with broad-spectrum antibiotics including vancomycin  and cefepime  and azithromycin .  Cultures are negative.  MRSA PCR negative.  Patient has multiple drug allergies.  She has previously tolerated doxycycline .  Will change to oral doxycycline  for 7 days. Blood cultures, sputum cultures, Legionella and strep negative. Chest physiotherapy, incentive spirometry, deep breathing exercises, sputum induction, mucolytic's and bronchodilators. Supplemental oxygen to keep saturations more than 90%.  Wean off oxygen. High-dose steroids, taper off.  On prednisone  5 mg daily at home.  Will keep on prednisone  10 mg for next few days then she will go back on her 5 mg doses. Team to work with PT OT. Leukocytosis likely secondary to steroids.  Tapering off of steroids.  Chronic diastolic heart failure: Currently needing  resuscitation.  Euvolemic today.  Holding diuretics.  Rheumatoid arthritis: On chronic prednisone  therapy.  Currently on high-dose Solu-Medrol .  Will gradually taper off to home dose of prednisone .  Hyperlipidemia: On atorvastatin .  Continue.  Essential hypertension: Blood pressures normal.  Holding antihypertensives.  Goal of care: Patient comes with yellow DNR paper signed from her ALF Gave information regarding MOST form, described different levels of DNR.  Patient will review it , will fill out MOST form before discharge.     DVT prophylaxis: SCDs Start: 08/04/24 2030   Code Status: DNR with intervention Family Communication: Daughter on the phone Disposition Plan: Status is: Inpatient Remains inpatient appropriate because: Severe systemic illness Patient can transfer to general bed.  Anticipate discharging to SNF before she is going back to ALF.     Consultants:  None  Procedures:  None  Antimicrobials:  Vancomycin , cefepime , azithromycin  8/25 --8/28 Doxycycline  8/28---     Objective: Vitals:   08/07/24 0500 08/07/24 0600 08/07/24 0749 08/07/24 0858  BP:      Pulse: (!) 50 (!) 58    Resp: 13 13    Temp:   97.8 F (36.6 C)   TempSrc:   Oral   SpO2: 97% 97%  97%  Weight:      Height:        Intake/Output Summary (Last 24 hours) at 08/07/2024 1103 Last data filed at 08/07/2024 0451 Gross per 24 hour  Intake 1612.67 ml  Output 1400 ml  Net 212.67 ml   Filed Weights   08/04/24 2330 08/06/24 0500  Weight: 77.8 kg 83.7 kg    Examination:  General exam: Chronically sick looking.  Looks fairly comfortable today.  Able to talk in complete sentences.  On minimal oxygen.  Respiratory system: Clear to auscultation.  Poor inspiratory effort.  Poor bilateral air entry mostly at bases.  Able to talk in complete sentences.  Dry cough present. Cardiovascular system: S1 & S2 heard, RRR.  Gastrointestinal system: Soft.  Nontender.  Bowel sound present. Central  nervous system: Alert and oriented. No focal neurological deficits.  Gross generalized weakness.    Data Reviewed: I have personally reviewed following labs and imaging studies  CBC: Recent Labs  Lab 08/04/24 1854 08/05/24 0303 08/06/24 0308 08/07/24 0304  WBC 20.3* 16.1* 18.3* 19.2*  NEUTROABS 15.8* 14.7* 16.9* 17.3*  HGB 11.6* 10.5* 8.9* 9.5*  HCT 37.6 34.9* 30.5* 30.8*  MCV 92.8 94.1 94.1 93.6  PLT 257 222 214 228   Basic Metabolic Panel: Recent Labs  Lab 08/04/24 1854 08/05/24 0303 08/06/24 0308  NA 131* 139 141  K 4.0 4.3 3.6  CL 98 108 109  CO2 23 21* 19*  GLUCOSE 137* 162* 178*  BUN 18 16 21   CREATININE 1.49* 0.92 0.98  CALCIUM  8.7* 8.5* 8.5*  MG 2.1 1.8 2.1  PHOS  --  3.0  --    GFR: Estimated Creatinine Clearance: 41.6 mL/min (by C-G formula based on SCr of 0.98 mg/dL). Liver Function Tests: Recent Labs  Lab 08/04/24 1854 08/05/24 0303  AST 25 25  ALT 17 15  ALKPHOS 89 74  BILITOT 1.2 0.7  PROT 7.2 6.3*  ALBUMIN  3.2* 2.5*   No results for input(s): LIPASE, AMYLASE in the last 168 hours. No results for input(s): AMMONIA in the last 168 hours. Coagulation Profile: Recent Labs  Lab 08/04/24 1854  INR 1.2   Cardiac Enzymes: Recent Labs  Lab 08/04/24 1854  CKTOTAL 69   BNP (last 3 results) No results for input(s): PROBNP in the last 8760 hours. HbA1C: No results for input(s): HGBA1C in the last 72 hours. CBG: No results for input(s): GLUCAP in the last 168 hours. Lipid Profile: No results for input(s): CHOL, HDL, LDLCALC, TRIG, CHOLHDL, LDLDIRECT in the last 72 hours. Thyroid  Function Tests: Recent Labs    08/04/24 1854  TSH 1.777   Anemia Panel: No results for input(s): VITAMINB12, FOLATE, FERRITIN, TIBC, IRON, RETICCTPCT in the last 72 hours. Sepsis Labs: Recent Labs  Lab 08/04/24 1848 08/04/24 2247  LATICACIDVEN 1.9 1.5    Recent Results (from the past 240 hours)  Resp panel by RT-PCR  (RSV, Flu A&B, Covid) Anterior Nasal Swab     Status: None   Collection Time: 08/04/24  6:53 PM   Specimen: Anterior Nasal Swab  Result Value Ref Range Status   SARS Coronavirus 2 by RT PCR NEGATIVE NEGATIVE Final    Comment: (NOTE) SARS-CoV-2 target nucleic acids are NOT DETECTED.  The SARS-CoV-2 RNA is generally detectable in upper respiratory specimens during the acute phase of infection. The lowest concentration of SARS-CoV-2 viral copies this assay can detect is 138 copies/mL. A negative result does not preclude SARS-Cov-2 infection and should not be used as the sole basis for treatment or other patient management decisions. A negative result may occur with  improper specimen collection/handling, submission of specimen other than nasopharyngeal swab, presence of viral mutation(s) within the areas targeted by this assay, and inadequate number of viral copies(<138 copies/mL). A negative result must be combined with clinical observations, patient history, and epidemiological information. The expected result is Negative.  Fact Sheet for Patients:  BloggerCourse.com  Fact Sheet for Healthcare Providers:  SeriousBroker.it  This test is no t yet approved or cleared by  the [place]  FDA and  has been authorized for detection and/or diagnosis of SARS-CoV-2 by FDA under an Emergency Use Authorization (EUA). This EUA will remain  in effect (meaning this test can be used) for the duration of the COVID-19 declaration under Section 564(b)(1) of the Act, 21 U.S.C.section 360bbb-3(b)(1), unless the authorization is terminated  or revoked sooner.       Influenza A by PCR NEGATIVE NEGATIVE Final   Influenza B by PCR NEGATIVE NEGATIVE Final    Comment: (NOTE) The Xpert Xpress SARS-CoV-2/FLU/RSV plus assay is intended as an aid in the diagnosis of influenza from Nasopharyngeal swab specimens and should not be used as a sole basis for  treatment. Nasal washings and aspirates are unacceptable for Xpert Xpress SARS-CoV-2/FLU/RSV testing.  Fact Sheet for Patients: BloggerCourse.com  Fact Sheet for Healthcare Providers: SeriousBroker.it  This test is not yet approved or cleared by the [place]  FDA and has been authorized for detection and/or diagnosis of SARS-CoV-2 by FDA under an Emergency Use Authorization (EUA). This EUA will remain in effect (meaning this test can be used) for the duration of the COVID-19 declaration under Section 564(b)(1) of the Act, 21 U.S.C. section 360bbb-3(b)(1), unless the authorization is terminated or revoked.     Resp Syncytial Virus by PCR NEGATIVE NEGATIVE Final    Comment: (NOTE) Fact Sheet for Patients: BloggerCourse.com  Fact Sheet for Healthcare Providers: SeriousBroker.it  This test is not yet approved or cleared by the [place]  FDA and has been authorized for detection and/or diagnosis of SARS-CoV-2 by FDA under an Emergency Use Authorization (EUA). This EUA will remain in effect (meaning this test can be used) for the duration of the COVID-19 declaration under Section 564(b)(1) of the Act, 21 U.S.C. section 360bbb-3(b)(1), unless the authorization is terminated or revoked.  Performed at [facility], [address] [place], [place] [address]   Blood Culture (routine x 2)     Status: None (Preliminary result)   Collection Time: 08/04/24  6:57 PM   Specimen: BLOOD  Result Value Ref Range Status   Specimen Description   Final    BLOOD LEFT ANTECUBITAL Performed at [facility], [address], [place], [place] [address]    Special Requests   Final    BOTTLES DRAWN AEROBIC AND ANAEROBIC Blood Culture adequate volume Performed at [facility], [address] [place], [place] [address]    Culture   Final     NO GROWTH 2 DAYS Performed at [facility], [address] [place], [place] [address]    Report Status PENDING  Incomplete  Blood Culture (routine x 2)     Status: None (Preliminary result)   Collection Time: 08/04/24  7:00 PM   Specimen: BLOOD  Result Value Ref Range Status   Specimen Description   Final    BLOOD BLOOD LEFT HAND Performed at [facility], [address] [place], [place] [address]    Special Requests   Final    BOTTLES DRAWN AEROBIC ONLY Blood Culture adequate volume Performed at [facility], [address], [place], [place] [address]    Culture   Final    NO GROWTH 2 DAYS Performed at [facility], [address] [place], [place] [address]    Report Status PENDING  Incomplete  MRSA Next Gen by PCR, Nasal     Status: None   Collection Time: 08/04/24 11:42 PM   Specimen:  Nasal Mucosa; Nasal Swab  Result Value Ref Range Status   MRSA by PCR Next Gen NOT DETECTED NOT DETECTED Final    Comment: (NOTE) The GeneXpert MRSA Assay (FDA approved for NASAL specimens only), is one component of a comprehensive MRSA colonization surveillance program. It is not intended to diagnose MRSA infection nor to guide or monitor treatment for MRSA infections. Test performance is not FDA approved in patients less than 82 years old. Performed at [facility], [address], [place], [place] [address]   Expectorated Sputum Assessment w Gram Stain, Rflx to Resp Cult     Status: None   Collection Time: 08/05/24  8:14 AM   Specimen: Expectorated Sputum  Result Value Ref Range Status   Specimen Description EXPECTORATED SPUTUM  Final   Special Requests Immunocompromised  Final   Sputum evaluation   Final    Sputum specimen not acceptable for testing.  Please recollect.   GARCIA,K RN NOTIFIED Performed at [facility], [address] [place], [place] [address]    Report Status 08/05/2024  FINAL  Final  Expectorated Sputum Assessment w Gram Stain, Rflx to Resp Cult     Status: None   Collection Time: 08/05/24  4:26 PM   Specimen: Expectorated Sputum  Result Value Ref Range Status   Specimen Description EXPSU  Final   Special Requests Immunocompromised  Final   Sputum evaluation   Final    Sputum specimen not acceptable for testing.  Please recollect.   Performed at [facility], [address] [place], [place] [address]    Report Status 08/05/2024 FINAL  Final         Radiology Studies: No results found.       Scheduled Meds:  atorvastatin   20 mg Oral Q1200   Chlorhexidine  Gluconate Cloth  6 each Topical Daily   doxycycline   100 mg Oral Q12H   guaiFENesin   600 mg Oral BID   ipratropium-albuterol   3 mL Nebulization TID   loratadine   10 mg Oral Daily   montelukast   10 mg Oral QHS   predniSONE   20 mg Oral Q breakfast   Continuous Infusions:     LOS: 3 days    Time spent: 52 minutes    Renato Applebaum, MD [facility]

## 2024-08-07 NOTE — Plan of Care (Signed)
  Problem: Clinical Measurements: Goal: Ability to maintain clinical measurements within normal limits will improve Outcome: Progressing   Problem: Clinical Measurements: Goal: Will remain free from infection Outcome: Progressing   Problem: Education: Goal: Knowledge of General Education information will improve Description: Including pain rating scale, medication(s)/side effects and non-pharmacologic comfort measures Outcome: Progressing

## 2024-08-08 DIAGNOSIS — J9601 Acute respiratory failure with hypoxia: Secondary | ICD-10-CM | POA: Diagnosis not present

## 2024-08-08 LAB — CBC WITH DIFFERENTIAL/PLATELET
Abs Immature Granulocytes: 0.08 K/uL — ABNORMAL HIGH (ref 0.00–0.07)
Basophils Absolute: 0 K/uL (ref 0.0–0.1)
Basophils Relative: 0 %
Eosinophils Absolute: 0 K/uL (ref 0.0–0.5)
Eosinophils Relative: 0 %
HCT: 33 % — ABNORMAL LOW (ref 36.0–46.0)
Hemoglobin: 10.1 g/dL — ABNORMAL LOW (ref 12.0–15.0)
Immature Granulocytes: 1 %
Lymphocytes Relative: 19 %
Lymphs Abs: 2.9 K/uL (ref 0.7–4.0)
MCH: 28.4 pg (ref 26.0–34.0)
MCHC: 30.6 g/dL (ref 30.0–36.0)
MCV: 92.7 fL (ref 80.0–100.0)
Monocytes Absolute: 1.2 K/uL — ABNORMAL HIGH (ref 0.1–1.0)
Monocytes Relative: 8 %
Neutro Abs: 11.2 K/uL — ABNORMAL HIGH (ref 1.7–7.7)
Neutrophils Relative %: 72 %
Platelets: 245 K/uL (ref 150–400)
RBC: 3.56 MIL/uL — ABNORMAL LOW (ref 3.87–5.11)
RDW: 16.5 % — ABNORMAL HIGH (ref 11.5–15.5)
WBC: 15.4 K/uL — ABNORMAL HIGH (ref 4.0–10.5)
nRBC: 0 % (ref 0.0–0.2)

## 2024-08-08 MED ORDER — DOXYCYCLINE HYCLATE 100 MG PO TABS: 100.0000 mg | ORAL_TABLET | Freq: Two times a day (BID) | ORAL | 0 refills | Status: AC

## 2024-08-08 MED ORDER — TRAMADOL HCL 50 MG PO TABS: 50.0000 mg | ORAL_TABLET | Freq: Four times a day (QID) | ORAL | 0 refills | Status: AC | PRN

## 2024-08-08 NOTE — Discharge Summary (Signed)
 Physician Discharge Summary  Eileen Wilkerson FMW:[id number] DOB: [birth year] DOA: 08/04/2024  PCP: Mast, [name], NP  Admit date: 08/04/2024 Discharge date: 08/08/2024  Admitted From: Assisted living facility Disposition: Skilled nursing facility  Recommendations for Outpatient Follow-up:  Follow up with PCP in 1-2 weeks Please obtain BMP/CBC in one week Continue to use incentive spirometry, over-the-counter cough medications as needed  Home Health: N/A Equipment/Devices: N/A  Discharge Condition: Stable CODE STATUS: DNR with intervention Diet recommendation: Low-salt diet  Discharge summary: 89 year old with history of COPD, asthma, chronic diastolic heart failure, rheumatoid arthritis on chronic prednisone  therapy, hypertension, hyperlipidemia presented with about 1 day of productive cough, shortness of breath and fever as well as generalized myalgia.  In the emergency room, temperature 101.2, heart rate 100.  Blood pressures low normal responded to 3 L of IV fluids.  White blood cell count 20.3 with 78% neutrophils.  COVID, RSV and influenza negative.  CT scan with new bilateral lower lobe consolidation consistent with multifocal pneumonia.  Admitted with broad-spectrum antibiotics.  Cultures were negative.  She did good clinical recovery and now on oral antibiotics.  Able to transition to a SNF for rehab before going back to assisted living facility.  Treated for following conditions.    Assessment & plan of care:   COPD with acute exacerbation secondary to bilateral lower lobe pneumonia: Acute hypoxemic respiratory failure secondary to above.  Now on room air. Severe sepsis present on admission, tachycardia, tachypnea and leukocytosis.  Improved.   Patient was treated with broad-spectrum antibiotics including vancomycin  and cefepime  and azithromycin .  Cultures are negative.  MRSA PCR negative.  Patient has multiple drug allergies.   She is tolerating doxycycline .   Doxycycline  for  additional 6 days to complete total 10 days of antibiotic therapy.   Blood cultures, sputum cultures, Legionella and strep negative. Continue with chest physiotherapy, incentive spirometry, deep breathing exercises, sputum induction, mucolytic's and bronchodilators at rehab. She is on room air now.  Treated with high-dose steroids then with prednisone .  She will go back on her maintenance dose of prednisone  5 mg daily that she takes at home.   Leukocytosis likely secondary to steroids.  Tapering off of steroids.  Trending down.   Chronic diastolic heart failure: Currently needing resuscitation.  Euvolemic today.  Can resume diuretics.   Rheumatoid arthritis: On chronic prednisone  therapy.  Resume long-term prednisone  therapy.  She is also on tramadol  for pain relief.  Hyperlipidemia: On atorvastatin .  Continue.   Essential hypertension: Blood pressures normal.  Can resume amlodipine  and torsemide.   Goal of care: Patient comes with yellow DNR paper signed from her ALF. Gave information regarding MOST form, described different levels of DNR.   Please reconsult outpatient palliative team for detailed education and follow-up.  Stable to transition to SNF today.    Discharge Diagnoses:  Principal Problem:   Acute hypoxic respiratory failure (HCC) Active Problems:   Leukocytosis   Fever   Rheumatoid arthritis (HCC)   CAP (community acquired pneumonia)   Severe sepsis (HCC)   Allergic rhinitis   HLD (hyperlipidemia)   Chronic diastolic CHF (congestive heart failure) (HCC)   Essential hypertension   COPD with acute exacerbation (HCC)   AKI (acute kidney injury) (HCC)   Acute hyponatremia   Anemia of chronic disease    Discharge Instructions  Discharge Instructions     Diet general   Complete by: As directed    Increase activity slowly   Complete by: As directed  Allergies as of 08/08/2024       Reactions   Adhesive [tape] Rash   Celebrex [celecoxib] Hives    Ciprofibrate Nausea Only   Cymbalta [duloxetine Hcl] Swelling   Gabitril [tiagabine] Swelling   Lyrica [pregabalin] Swelling   Neurontin [gabapentin] Swelling   Nexium  [esomeprazole ] Rash   Nsaids Rash   Shrimp [shellfish Allergy] Anaphylaxis   Per patient shrimp only   Azactam  [aztreonam ]    Hand swelling    Azelastine  Hcl    Rash    Ciprofloxacin  Other (See Comments)   dizziness   Claritin  [loratadine ]    Irritability Nervousness    Methotrexate  Derivatives    Nasacort  Estella.Feil ]    Dizzy    Olopatadine  Other (See Comments)   Pain and lethargy    Other    Sulfamethizole Other (See Comments)   unknown   Zantac [ranitidine Hcl] Other (See Comments)   unknown   Claritin -d 12 Hour [loratadine -pseudoephedrine Er] Anxiety   Keflex  [cephalexin ] Nausea And Vomiting   Tolerated Ancef    Penicillins Rash   Injection site reaction. Tolerated cefepime  in past. Also reports tolerating a penicillin infusion after this initial rxn ~20 yrs ago.  Has patient had a PCN reaction causing immediate rash, facial/tongue/throat swelling, SOB or lightheadedness with hypotension: No Has patient had a PCN reaction causing severe rash involving mucus membranes or skin necrosis: No Has patient had a PCN reaction that required hospitalization No Has patient had a PCN reaction occurring within the last 10 years: No   Sulfa Antibiotics Nausea And Vomiting        Medication List     STOP taking these medications    potassium chloride  SA 20 MEQ tablet Commonly known as: KLOR-CON  M       TAKE these medications    acetaminophen  325 MG tablet Commonly known as: TYLENOL  Take 650 mg by mouth 3 (three) times daily.   allopurinol 100 MG tablet Commonly known as: ZYLOPRIM Take 100 mg by mouth daily at 12 noon.   amLODipine  5 MG tablet Commonly known as: NORVASC  Take 5 mg by mouth daily at 12 noon.   atorvastatin  20 MG tablet Commonly known as: LIPITOR TAKE 1 TABLET BY MOUTH EVERY  DAY What changed: when to take this   Biotin  5 MG Tbdp Take 1 tablet (5 mg total) by mouth daily. What changed: when to take this   cholecalciferol  25 MCG (1000 UNIT) tablet Commonly known as: VITAMIN D3 Take 1,000 Units by mouth daily at 12 noon.   cycloSPORINE  0.05 % ophthalmic emulsion Commonly known as: RESTASIS  Place 1 drop into both eyes 2 (two) times daily.   D-Mannose 500 MG Caps Take 500 mg by mouth daily.   diclofenac  Sodium 1 % Gel Commonly known as: VOLTAREN  APPLY 2 TO 4 GRAMS TOPICALLY TO AFFECTED JOINTS UP TO 4 TIMES DAILY   docusate sodium  100 MG capsule Commonly known as: Colace Take 1 capsule (100 mg total) by mouth 2 (two) times daily.   doxycycline  100 MG tablet Commonly known as: VIBRA -TABS Take 1 tablet (100 mg total) by mouth every 12 (twelve) hours for 6 days.   fexofenadine  60 MG tablet Commonly known as: ALLEGRA  Take 60 mg by mouth daily.   ipratropium-albuterol  0.5-2.5 (3) MG/3ML Soln Commonly known as: DUONEB Take 3 mLs by nebulization as needed.   lansoprazole  30 MG capsule Commonly known as: PREVACID  TAKE 1 CAPSULE BY MOUTH ONCE DAILY AT NOON What changed: See the new instructions.   lidocaine  4 %  Place 1 patch onto the skin daily. Apply to right ankle   montelukast  10 MG tablet Commonly known as: SINGULAIR  TAKE 1 TABLET BY MOUTH EVERYDAY AT BEDTIME What changed: See the new instructions.   ondansetron  4 MG tablet Commonly known as: ZOFRAN  Take 4 mg by mouth as needed for nausea or vomiting.   predniSONE  5 MG tablet Commonly known as: DELTASONE  Take 5 mg by mouth daily at 12 noon.   saccharomyces boulardii 250 MG capsule Commonly known as: FLORASTOR Take 250 mg by mouth 2 (two) times daily.   Torsemide 40 MG Tabs Take 40 mg by mouth daily at 12 noon.   traMADol  50 MG tablet Commonly known as: ULTRAM  Take 1 tablet (50 mg total) by mouth every 6 (six) hours as needed for up to 5 days (pain).        Allergies   Allergen Reactions   Adhesive [Tape] Rash   Celebrex [Celecoxib] Hives   Ciprofibrate Nausea Only   Cymbalta [Duloxetine Hcl] Swelling   Gabitril [Tiagabine] Swelling   Lyrica [Pregabalin] Swelling   Neurontin [Gabapentin] Swelling   Nexium  [Esomeprazole ] Rash   Nsaids Rash   Shrimp [Shellfish Allergy] Anaphylaxis    Per patient shrimp only   Azactam  [Aztreonam ]     Hand swelling    Azelastine  Hcl     Rash    Ciprofloxacin  Other (See Comments)    dizziness   Claritin  [Loratadine ]     Irritability Nervousness    Methotrexate  Derivatives    Nasacort  [Triamcinolone ]     Dizzy    Olopatadine  Other (See Comments)    Pain and lethargy    Other    Sulfamethizole Other (See Comments)    unknown   Zantac [Ranitidine Hcl] Other (See Comments)    unknown   Claritin -D 12 Hour [Loratadine -Pseudoephedrine Er] Anxiety   Keflex  [Cephalexin ] Nausea And Vomiting    Tolerated Ancef    Penicillins Rash    Injection site reaction. Tolerated cefepime  in past. Also reports tolerating a penicillin infusion after this initial rxn ~20 yrs ago.  Has patient had a PCN reaction causing immediate rash, facial/tongue/throat swelling, SOB or lightheadedness with hypotension: No Has patient had a PCN reaction causing severe rash involving mucus membranes or skin necrosis: No Has patient had a PCN reaction that required hospitalization No Has patient had a PCN reaction occurring within the last 10 years: No     Sulfa Antibiotics Nausea And Vomiting    Consultations: None   Procedures/Studies: CT Angio Chest Pulmonary Embolism (PE) W or WO Contrast Result Date: 08/05/2024 CLINICAL DATA:  Fever and shortness of breath for 1 day EXAM: CT ANGIOGRAPHY CHEST WITH CONTRAST TECHNIQUE: Multidetector CT imaging of the chest was performed using the standard protocol during bolus administration of intravenous contrast. Multiplanar CT image reconstructions and MIPs were obtained to evaluate the vascular  anatomy. RADIATION DOSE REDUCTION: This exam was performed according to the departmental dose-optimization program which includes automated exposure control, adjustment of the mA and/or kV according to patient size and/or use of iterative reconstruction technique. CONTRAST:  60mL OMNIPAQUE  IOHEXOL  350 MG/ML SOLN COMPARISON:  Chest x-ray from the previous day. FINDINGS: Cardiovascular: Thoracic aorta shows atherosclerotic calcifications without aneurysmal dilatation. No cardiac enlargement is noted. The pulmonary artery shows a normal branching pattern bilaterally. No filling defect to suggest pulmonary embolism is noted. Mild coronary calcifications are noted. Mediastinum/Nodes: Thoracic inlet is within normal limits. No hilar or mediastinal adenopathy is noted. Scattered small mediastinal nodes are seen. The esophagus  as visualized is within normal limits. Lungs/Pleura: Lungs are well aerated bilaterally. New bilateral lower lobe consolidation is noted likely representing acute pneumonia. Scattered multiple pulmonary nodules are identified bilaterally similar to that seen on prior exam. Index nodule on the right lies in the upper lobe and measures approximately 10 mm stable in appearance. This is best seen on image number 67 of series 6. Index nodule on the left lies adjacent to the mediastinum with some apparent cavitation. This measures up to 17 mm also stable from the prior exam. Upper Abdomen: Visualized upper abdomen is unremarkable. Musculoskeletal: No chest wall abnormality. No acute or significant osseous findings. Review of the MIP images confirms the above findings. IMPRESSION: No evidence of pulmonary embolism. New bilateral lower lobe consolidation consistent with multifocal pneumonia. Multiple pulmonary nodules which appears stable from the prior exam. Although metastatic disease deserves consideration these have been present for several previous exams suggesting a more benign etiology. Electronically  Signed   By: Oneil Devonshire M.D.   On: 08/05/2024 01:00   DG Chest Port 1 View Result Date: 08/04/2024 CLINICAL DATA:  Questionable sepsis - evaluate for abnormality EXAM: PORTABLE CHEST 1 VIEW COMPARISON:  Chest x-ray 12/21/2023 FINDINGS: The heart and mediastinal contours are unchanged. Atherosclerotic plaque. No focal consolidation. No pulmonary edema. No pleural effusion. No pneumothorax. No acute osseous abnormality.  Cervical spine surgical hardware. IMPRESSION: 1. No active disease. 2.  Aortic Atherosclerosis (ICD10-I70.0). Electronically Signed   By: Morgane  Naveau M.D.   On: 08/04/2024 19:55   (Echo, Carotid, EGD, Colonoscopy, ERCP)    Subjective: Patient seen and examined.  She had a rough night with large bowel movement otherwise denies any other complaints.  On room air now.  Agreeable to discharge.  She has dry cough.   Discharge Exam: Vitals:   08/08/24 0340 08/08/24 0957  BP: (!) 152/67   Pulse: 73   Resp: 16   Temp: 98.3 F (36.8 C)   SpO2: 97% 98%   Vitals:   08/07/24 2038 08/07/24 2352 08/08/24 0340 08/08/24 0957  BP:  (!) 154/73 (!) 152/67   Pulse:  77 73   Resp:  16 16   Temp:  97.6 F (36.4 C) 98.3 F (36.8 C)   TempSrc:      SpO2: 95% 97% 97% 98%  Weight:      Height:        General: Pt is alert, awake, not in acute distress.  Pleasant interactive.  Chronically sick looking.  Debilitated. Cardiovascular: RRR, S1/S2 +, no rubs, no gallops Respiratory: CTA bilaterally, no wheezing, no rhonchi Abdominal: Soft, NT, ND, bowel sounds + Extremities: no edema, no cyanosis    The results of significant diagnostics from this hospitalization (including imaging, microbiology, ancillary and laboratory) are listed below for reference.     Microbiology: Recent Results (from the past 240 hours)  Resp panel by RT-PCR (RSV, Flu A&B, Covid) Anterior Nasal Swab     Status: None   Collection Time: 08/04/24  6:53 PM   Specimen: Anterior Nasal Swab  Result Value Ref  Range Status   SARS Coronavirus 2 by RT PCR NEGATIVE NEGATIVE Final    Comment: (NOTE) SARS-CoV-2 target nucleic acids are NOT DETECTED.  The SARS-CoV-2 RNA is generally detectable in upper respiratory specimens during the acute phase of infection. The lowest concentration of SARS-CoV-2 viral copies this assay can detect is 138 copies/mL. A negative result does not preclude SARS-Cov-2 infection and should not be used as the sole basis  for treatment or other patient management decisions. A negative result may occur with  improper specimen collection/handling, submission of specimen other than nasopharyngeal swab, presence of viral mutation(s) within the areas targeted by this assay, and inadequate number of viral copies(<138 copies/mL). A negative result must be combined with clinical observations, patient history, and epidemiological information. The expected result is Negative.  Fact Sheet for Patients:  BloggerCourse.com  Fact Sheet for Healthcare Providers:  SeriousBroker.it  This test is no t yet approved or cleared by the [place]  FDA and  has been authorized for detection and/or diagnosis of SARS-CoV-2 by FDA under an Emergency Use Authorization (EUA). This EUA will remain  in effect (meaning this test can be used) for the duration of the COVID-19 declaration under Section 564(b)(1) of the Act, 21 U.S.C.section 360bbb-3(b)(1), unless the authorization is terminated  or revoked sooner.       Influenza A by PCR NEGATIVE NEGATIVE Final   Influenza B by PCR NEGATIVE NEGATIVE Final    Comment: (NOTE) The Xpert Xpress SARS-CoV-2/FLU/RSV plus assay is intended as an aid in the diagnosis of influenza from Nasopharyngeal swab specimens and should not be used as a sole basis for treatment. Nasal washings and aspirates are unacceptable for Xpert Xpress SARS-CoV-2/FLU/RSV testing.  Fact Sheet for  Patients: BloggerCourse.com  Fact Sheet for Healthcare Providers: SeriousBroker.it  This test is not yet approved or cleared by the [place]  FDA and has been authorized for detection and/or diagnosis of SARS-CoV-2 by FDA under an Emergency Use Authorization (EUA). This EUA will remain in effect (meaning this test can be used) for the duration of the COVID-19 declaration under Section 564(b)(1) of the Act, 21 U.S.C. section 360bbb-3(b)(1), unless the authorization is terminated or revoked.     Resp Syncytial Virus by PCR NEGATIVE NEGATIVE Final    Comment: (NOTE) Fact Sheet for Patients: BloggerCourse.com  Fact Sheet for Healthcare Providers: SeriousBroker.it  This test is not yet approved or cleared by the [place]  FDA and has been authorized for detection and/or diagnosis of SARS-CoV-2 by FDA under an Emergency Use Authorization (EUA). This EUA will remain in effect (meaning this test can be used) for the duration of the COVID-19 declaration under Section 564(b)(1) of the Act, 21 U.S.C. section 360bbb-3(b)(1), unless the authorization is terminated or revoked.  Performed at [facility], [address], [place], [place] [address]   Blood Culture (routine x 2)     Status: None (Preliminary result)   Collection Time: 08/04/24  6:57 PM   Specimen: BLOOD  Result Value Ref Range Status   Specimen Description   Final    BLOOD LEFT ANTECUBITAL Performed at [facility], [address] [place], [place] [address]    Special Requests   Final    BOTTLES DRAWN AEROBIC AND ANAEROBIC Blood Culture adequate volume Performed at [facility], [address] [place], [place] [address]    Culture   Final    NO GROWTH 4 DAYS Performed at [facility], [address] [place], [place] [address]    Report Status  PENDING  Incomplete  Blood Culture (routine x 2)     Status: None (Preliminary result)   Collection Time: 08/04/24  7:00 PM   Specimen: BLOOD  Result Value Ref Range Status   Specimen Description   Final    BLOOD BLOOD LEFT HAND Performed at [facility], [address] [place], [place] [address]  Special Requests   Final    BOTTLES DRAWN AEROBIC ONLY Blood Culture adequate volume Performed at [facility], [address], [place], [place] [address]    Culture   Final    NO GROWTH 4 DAYS Performed at [facility], [address] [place], [place] [address]    Report Status PENDING  Incomplete  MRSA Next Gen by PCR, Nasal     Status: None   Collection Time: 08/04/24 11:42 PM   Specimen: Nasal Mucosa; Nasal Swab  Result Value Ref Range Status   MRSA by PCR Next Gen NOT DETECTED NOT DETECTED Final    Comment: (NOTE) The GeneXpert MRSA Assay (FDA approved for NASAL specimens only), is one component of a comprehensive MRSA colonization surveillance program. It is not intended to diagnose MRSA infection nor to guide or monitor treatment for MRSA infections. Test performance is not FDA approved in patients less than 49 years old. Performed at [facility], [address], [place], [place] [address]   Expectorated Sputum Assessment w Gram Stain, Rflx to Resp Cult     Status: None   Collection Time: 08/05/24  8:14 AM   Specimen: Expectorated Sputum  Result Value Ref Range Status   Specimen Description EXPECTORATED SPUTUM  Final   Special Requests Immunocompromised  Final   Sputum evaluation   Final    Sputum specimen not acceptable for testing.  Please recollect.   GARCIA,K RN NOTIFIED Performed at [facility], [address] [place], [place] [address]    Report Status 08/05/2024 FINAL  Final  Expectorated Sputum Assessment w Gram Stain, Rflx to Resp Cult     Status: None   Collection Time:  08/05/24  4:26 PM   Specimen: Expectorated Sputum  Result Value Ref Range Status   Specimen Description EXPSU  Final   Special Requests Immunocompromised  Final   Sputum evaluation   Final    Sputum specimen not acceptable for testing.  Please recollect.   Performed at [facility], [address] [place], [place] [address]    Report Status 08/05/2024 FINAL  Final     Labs: BNP (last 3 results) Recent Labs    12/21/23 1639 08/04/24 1854 08/05/24 0303  BNP 99.9 102.0* 176.2*   Basic Metabolic Panel: Recent Labs  Lab 08/04/24 1854 08/05/24 0303 08/06/24 0308  NA 131* 139 141  K 4.0 4.3 3.6  CL 98 108 109  CO2 23 21* 19*  GLUCOSE 137* 162* 178*  BUN 18 16 21   CREATININE 1.49* 0.92 0.98  CALCIUM  8.7* 8.5* 8.5*  MG 2.1 1.8 2.1  PHOS  --  3.0  --    Liver Function Tests: Recent Labs  Lab 08/04/24 1854 08/05/24 0303  AST 25 25  ALT 17 15  ALKPHOS 89 74  BILITOT 1.2 0.7  PROT 7.2 6.3*  ALBUMIN  3.2* 2.5*   No results for input(s): LIPASE, AMYLASE in the last 168 hours. No results for input(s): AMMONIA in the last 168 hours. CBC: Recent Labs  Lab 08/04/24 1854 08/05/24 0303 08/06/24 0308 08/07/24 0304 08/08/24 0812  WBC 20.3* 16.1* 18.3* 19.2* 15.4*  NEUTROABS 15.8* 14.7* 16.9* 17.3* 11.2*  HGB 11.6* 10.5* 8.9* 9.5* 10.1*  HCT 37.6 34.9* 30.5* 30.8* 33.0*  MCV 92.8 94.1 94.1 93.6 92.7  PLT 257 222 214 228 245   Cardiac Enzymes: Recent Labs  Lab 08/04/24 1854  CKTOTAL 69   BNP: Invalid input(s): POCBNP CBG:  No results for input(s): GLUCAP in the last 168 hours. D-Dimer No results for input(s): DDIMER in the last 72 hours. Hgb A1c No results for input(s): HGBA1C in the last 72 hours. Lipid Profile No results for input(s): CHOL, HDL, LDLCALC, TRIG, CHOLHDL, LDLDIRECT in the last 72 hours. Thyroid  function studies No results for input(s): TSH, T4TOTAL, T3FREE, THYROIDAB in the last 72  hours.  Invalid input(s): FREET3 Anemia work up No results for input(s): VITAMINB12, FOLATE, FERRITIN, TIBC, IRON, RETICCTPCT in the last 72 hours. Urinalysis    Component Value Date/Time   COLORURINE YELLOW 08/04/2024 2037   APPEARANCEUR HAZY (A) 08/04/2024 2037   LABSPEC 1.016 08/04/2024 2037   PHURINE 5.0 08/04/2024 2037   GLUCOSEU NEGATIVE 08/04/2024 2037   GLUCOSEU NEGATIVE 02/13/2018 1036   HGBUR MODERATE (A) 08/04/2024 2037   BILIRUBINUR NEGATIVE 08/04/2024 2037   BILIRUBINUR negative 01/31/2023 1333   BILIRUBINUR Negative 03/28/2021 1611   KETONESUR NEGATIVE 08/04/2024 2037   PROTEINUR 30 (A) 08/04/2024 2037   UROBILINOGEN 0.2 01/31/2023 1333   UROBILINOGEN 0.2 02/13/2018 1036   NITRITE NEGATIVE 08/04/2024 2037   LEUKOCYTESUR NEGATIVE 08/04/2024 2037   Sepsis Labs Recent Labs  Lab 08/05/24 0303 08/06/24 0308 08/07/24 0304 08/08/24 0812  WBC 16.1* 18.3* 19.2* 15.4*   Microbiology Recent Results (from the past 240 hours)  Resp panel by RT-PCR (RSV, Flu A&B, Covid) Anterior Nasal Swab     Status: None   Collection Time: 08/04/24  6:53 PM   Specimen: Anterior Nasal Swab  Result Value Ref Range Status   SARS Coronavirus 2 by RT PCR NEGATIVE NEGATIVE Final    Comment: (NOTE) SARS-CoV-2 target nucleic acids are NOT DETECTED.  The SARS-CoV-2 RNA is generally detectable in upper respiratory specimens during the acute phase of infection. The lowest concentration of SARS-CoV-2 viral copies this assay can detect is 138 copies/mL. A negative result does not preclude SARS-Cov-2 infection and should not be used as the sole basis for treatment or other patient management decisions. A negative result may occur with  improper specimen collection/handling, submission of specimen other than nasopharyngeal swab, presence of viral mutation(s) within the areas targeted by this assay, and inadequate number of viral copies(<138 copies/mL). A negative result must be  combined with clinical observations, patient history, and epidemiological information. The expected result is Negative.  Fact Sheet for Patients:  BloggerCourse.com  Fact Sheet for Healthcare Providers:  SeriousBroker.it  This test is no t yet approved or cleared by the [place]  FDA and  has been authorized for detection and/or diagnosis of SARS-CoV-2 by FDA under an Emergency Use Authorization (EUA). This EUA will remain  in effect (meaning this test can be used) for the duration of the COVID-19 declaration under Section 564(b)(1) of the Act, 21 U.S.C.section 360bbb-3(b)(1), unless the authorization is terminated  or revoked sooner.       Influenza A by PCR NEGATIVE NEGATIVE Final   Influenza B by PCR NEGATIVE NEGATIVE Final    Comment: (NOTE) The Xpert Xpress SARS-CoV-2/FLU/RSV plus assay is intended as an aid in the diagnosis of influenza from Nasopharyngeal swab specimens and should not be used as a sole basis for treatment. Nasal washings and aspirates are unacceptable for Xpert Xpress SARS-CoV-2/FLU/RSV testing.  Fact Sheet for Patients: BloggerCourse.com  Fact Sheet for Healthcare Providers: SeriousBroker.it  This test is not yet approved or cleared by the [place]  FDA and has been authorized for detection and/or diagnosis of SARS-CoV-2 by FDA under an Emergency Use Authorization (EUA). This  EUA will remain in effect (meaning this test can be used) for the duration of the COVID-19 declaration under Section 564(b)(1) of the Act, 21 U.S.C. section 360bbb-3(b)(1), unless the authorization is terminated or revoked.     Resp Syncytial Virus by PCR NEGATIVE NEGATIVE Final    Comment: (NOTE) Fact Sheet for Patients: BloggerCourse.com  Fact Sheet for Healthcare Providers: SeriousBroker.it  This test is not yet  approved or cleared by the [place]  FDA and has been authorized for detection and/or diagnosis of SARS-CoV-2 by FDA under an Emergency Use Authorization (EUA). This EUA will remain in effect (meaning this test can be used) for the duration of the COVID-19 declaration under Section 564(b)(1) of the Act, 21 U.S.C. section 360bbb-3(b)(1), unless the authorization is terminated or revoked.  Performed at [facility], [address] [place], [place] [address]   Blood Culture (routine x 2)     Status: None (Preliminary result)   Collection Time: 08/04/24  6:57 PM   Specimen: BLOOD  Result Value Ref Range Status   Specimen Description   Final    BLOOD LEFT ANTECUBITAL Performed at [facility], [address], [place], [place] [address]    Special Requests   Final    BOTTLES DRAWN AEROBIC AND ANAEROBIC Blood Culture adequate volume Performed at [facility], [address] [place], [place] [address]    Culture   Final    NO GROWTH 4 DAYS Performed at [facility], [address] [place], [place] [address]    Report Status PENDING  Incomplete  Blood Culture (routine x 2)     Status: None (Preliminary result)   Collection Time: 08/04/24  7:00 PM   Specimen: BLOOD  Result Value Ref Range Status   Specimen Description   Final    BLOOD BLOOD LEFT HAND Performed at [facility], [address] [place], [place] [address]    Special Requests   Final    BOTTLES DRAWN AEROBIC ONLY Blood Culture adequate volume Performed at [facility], [address], [place], [place] [address]    Culture   Final    NO GROWTH 4 DAYS Performed at [facility], [address], [place], [place] [address]    Report Status PENDING  Incomplete  MRSA Next Gen by PCR, Nasal     Status: None   Collection Time: 08/04/24 11:42 PM   Specimen: Nasal Mucosa; Nasal Swab  Result Value Ref Range Status    MRSA by PCR Next Gen NOT DETECTED NOT DETECTED Final    Comment: (NOTE) The GeneXpert MRSA Assay (FDA approved for NASAL specimens only), is one component of a comprehensive MRSA colonization surveillance program. It is not intended to diagnose MRSA infection nor to guide or monitor treatment for MRSA infections. Test performance is not FDA approved in patients less than 74 years old. Performed at [facility], [address] [place], [place] [address]   Expectorated Sputum Assessment w Gram Stain, Rflx to Resp Cult     Status: None   Collection Time: 08/05/24  8:14 AM   Specimen: Expectorated Sputum  Result Value Ref Range Status   Specimen Description EXPECTORATED SPUTUM  Final   Special Requests Immunocompromised  Final   Sputum evaluation   Final    Sputum specimen not acceptable for testing.  Please recollect.   GARCIA,K RN NOTIFIED Performed at [facility], [address]  [place], [place] [address]    Report Status 08/05/2024 FINAL  Final  Expectorated Sputum Assessment w Gram Stain, Rflx to Resp Cult     Status: None   Collection Time: 08/05/24  4:26 PM   Specimen: Expectorated Sputum  Result Value Ref Range Status   Specimen Description EXPSU  Final   Special Requests Immunocompromised  Final   Sputum evaluation   Final    Sputum specimen not acceptable for testing.  Please recollect.   Performed at [facility], [address], [place], [place] [address]    Report Status 08/05/2024 FINAL  Final     Time coordinating discharge: 40 minutes  SIGNED:   Renato Applebaum, MD  [facility] 08/08/2024, 10:28 AM

## 2024-08-08 NOTE — TOC Transition Note (Signed)
 Transition of Care [facility]) - Discharge Note   Patient Details  Name: Eileen Wilkerson MRN: [id number] Date of Birth: 12/05/1932  [facility] CM/SW Contact:  Jon ONEIDA Anon, RN Phone Number: 08/08/2024, 11:33 AM   Clinical Narrative:    Pt will discharge to [facility] for STR. Pt agreeable to DC plan. NCM notified pt daughter [name] via phone to inform her of DC plan and she is agreeable. DC packet placed at RN station with signed DNR and scripts. [facility] called at 1112 for transport to SNF. RN given number to call report to [phone] to room 40 B. There are no other IP Care Management needs at this time.     Barriers to Discharge: No Barriers Identified   Patient Goals and CMS Choice Patient states their goals for this hospitalization and ongoing recovery are:: To go to STR at [facility] and then return to ALF CMS Medicare.gov Compare Post Acute Care list provided to:: Patient Choice offered to / list presented to : Patient [place] ownership interest in [facility].provided to:: Patient    Discharge Placement              Patient chooses bed at: [facility] Patient to be transferred to facility by: PTAR Name of family member notified: Ranae [name] (Daughter)  [phone] Patient and family notified of of transfer: 08/08/24  Discharge Plan and Services Additional resources added to the After Visit Summary for   In-house Referral: NA Discharge Planning Services: CM Consult Post Acute Care Choice: Durable Medical Equipment          DME Arranged: N/A DME Agency: NA       HH Arranged: NA HH Agency: NA        Social Drivers of Health (SDOH) Interventions SDOH Screenings   Food Insecurity: No Food Insecurity (12/21/2023)  Housing: Low Risk  (12/21/2023)  Transportation Needs: No Transportation Needs (12/21/2023)  Utilities: Not At Risk (12/21/2023)  Depression (PHQ2-9): Low Risk  (11/05/2023)   Tobacco Use: Medium Risk (08/04/2024)     Readmission Risk Interventions    08/06/2024    8:54 AM 05/30/2022   10:08 AM  Readmission Risk Prevention Plan  Transportation Screening Complete Complete  PCP or Specialist Appt within 5-7 Days Complete Complete  Home Care Screening Complete Complete  Medication Review (RN CM) Complete Complete

## 2024-08-09 LAB — CULTURE, BLOOD (ROUTINE X 2)
Culture: NO GROWTH
Culture: NO GROWTH
Special Requests: ADEQUATE
Special Requests: ADEQUATE

## 2024-08-11 DIAGNOSIS — J188 Other pneumonia, unspecified organism: Secondary | ICD-10-CM | POA: Diagnosis not present

## 2024-08-11 DIAGNOSIS — E782 Mixed hyperlipidemia: Secondary | ICD-10-CM | POA: Diagnosis not present

## 2024-08-11 DIAGNOSIS — I5032 Chronic diastolic (congestive) heart failure: Secondary | ICD-10-CM | POA: Diagnosis not present

## 2024-08-11 DIAGNOSIS — D72829 Elevated white blood cell count, unspecified: Secondary | ICD-10-CM | POA: Diagnosis not present

## 2024-08-11 DIAGNOSIS — J45998 Other asthma: Secondary | ICD-10-CM | POA: Diagnosis not present

## 2024-08-11 DIAGNOSIS — J189 Pneumonia, unspecified organism: Secondary | ICD-10-CM | POA: Diagnosis not present

## 2024-08-11 DIAGNOSIS — E871 Hypo-osmolality and hyponatremia: Secondary | ICD-10-CM | POA: Diagnosis not present

## 2024-08-11 DIAGNOSIS — K219 Gastro-esophageal reflux disease without esophagitis: Secondary | ICD-10-CM | POA: Diagnosis not present

## 2024-08-11 DIAGNOSIS — J449 Chronic obstructive pulmonary disease, unspecified: Secondary | ICD-10-CM | POA: Diagnosis not present

## 2024-08-11 DIAGNOSIS — M1A9XX0 Chronic gout, unspecified, without tophus (tophi): Secondary | ICD-10-CM | POA: Diagnosis not present

## 2024-08-11 DIAGNOSIS — J9601 Acute respiratory failure with hypoxia: Secondary | ICD-10-CM | POA: Diagnosis not present

## 2024-08-11 DIAGNOSIS — R7303 Prediabetes: Secondary | ICD-10-CM | POA: Diagnosis not present

## 2024-08-11 DIAGNOSIS — J45909 Unspecified asthma, uncomplicated: Secondary | ICD-10-CM | POA: Diagnosis not present

## 2024-08-11 DIAGNOSIS — M5116 Intervertebral disc disorders with radiculopathy, lumbar region: Secondary | ICD-10-CM | POA: Diagnosis not present

## 2024-08-11 DIAGNOSIS — M0579 Rheumatoid arthritis with rheumatoid factor of multiple sites without organ or systems involvement: Secondary | ICD-10-CM | POA: Diagnosis not present

## 2024-08-11 DIAGNOSIS — J441 Chronic obstructive pulmonary disease with (acute) exacerbation: Secondary | ICD-10-CM | POA: Diagnosis not present

## 2024-08-11 DIAGNOSIS — M810 Age-related osteoporosis without current pathological fracture: Secondary | ICD-10-CM | POA: Diagnosis not present

## 2024-08-11 DIAGNOSIS — I10 Essential (primary) hypertension: Secondary | ICD-10-CM | POA: Diagnosis not present

## 2024-08-11 DIAGNOSIS — E538 Deficiency of other specified B group vitamins: Secondary | ICD-10-CM | POA: Diagnosis not present

## 2024-08-11 DIAGNOSIS — J3089 Other allergic rhinitis: Secondary | ICD-10-CM | POA: Diagnosis not present

## 2024-08-12 ENCOUNTER — Encounter: Payer: Self-pay | Admitting: Nurse Practitioner

## 2024-08-12 ENCOUNTER — Non-Acute Institutional Stay (SKILLED_NURSING_FACILITY): Payer: Self-pay | Admitting: Nurse Practitioner

## 2024-08-12 DIAGNOSIS — I5032 Chronic diastolic (congestive) heart failure: Secondary | ICD-10-CM | POA: Diagnosis not present

## 2024-08-12 DIAGNOSIS — E782 Mixed hyperlipidemia: Secondary | ICD-10-CM

## 2024-08-12 DIAGNOSIS — M810 Age-related osteoporosis without current pathological fracture: Secondary | ICD-10-CM | POA: Diagnosis not present

## 2024-08-12 DIAGNOSIS — M5116 Intervertebral disc disorders with radiculopathy, lumbar region: Secondary | ICD-10-CM | POA: Diagnosis not present

## 2024-08-12 DIAGNOSIS — J441 Chronic obstructive pulmonary disease with (acute) exacerbation: Secondary | ICD-10-CM | POA: Diagnosis not present

## 2024-08-12 DIAGNOSIS — E538 Deficiency of other specified B group vitamins: Secondary | ICD-10-CM

## 2024-08-12 DIAGNOSIS — R7303 Prediabetes: Secondary | ICD-10-CM | POA: Diagnosis not present

## 2024-08-12 DIAGNOSIS — M1A9XX0 Chronic gout, unspecified, without tophus (tophi): Secondary | ICD-10-CM

## 2024-08-12 DIAGNOSIS — I10 Essential (primary) hypertension: Secondary | ICD-10-CM

## 2024-08-12 DIAGNOSIS — M0579 Rheumatoid arthritis with rheumatoid factor of multiple sites without organ or systems involvement: Secondary | ICD-10-CM

## 2024-08-12 DIAGNOSIS — K219 Gastro-esophageal reflux disease without esophagitis: Secondary | ICD-10-CM

## 2024-08-12 NOTE — Assessment & Plan Note (Signed)
 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  Diclofenac , MRI completed, Neurosurgery consulted, not a good surgical candidate.

## 2024-08-12 NOTE — Progress Notes (Signed)
 Location:  [facility] Room Number: N040-B Place of Service:  SNF (31) Provider:  Zach Tietje X, NP  Patient Care Team: Jeremiah Tarpley X, [name]  Extended Emergency Contact Information Primary Emergency Contact: Debrew,Jacqueline  [place] Home Phone: [phone] Mobile Phone: [phone] Relation: Daughter  Code Status:  DNR Goals of care: Advanced Directive information    08/12/2024    9:50 AM  Advanced Directives  Does Patient Have a Medical Advance Directive? Yes  Type of Estate agent of [place];Living will;Out of facility DNR (pink MOST or yellow form)  Does patient want to make changes to medical advance directive? No - Patient declined  Copy of Healthcare Power of Attorney in Chart? Yes - validated most recent copy scanned in chart (See row information)     Chief Complaint  Patient presents with   Hospitalization Follow-up    Follow-up from recent hospital stay     HPI:  Pt is a 89 y.o. female seen today for medication review following hospital stay  Hospitalized 08/04/2024 to 08/08/2024 for CAP, severe sepsis with WBC 20.3, fever(101.2).  CT chest showed bilateral lower lobe consolidation consistent with multiple focal pneumonia.  Culture was negative.  Fully treated with broad spectrum antibiotics and improved tachycardia, tachypnea, and leukocytosis.  Doxycycline  for additional 6 days to be completed in skilled nursing facility for total 10 days course.   Leukocytosis, wbc down to 15.4 08/08/24   Chronic diastolic heart failure, DOE, on and off wheezing, cough, occasional yellow phlegm in am. 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  Diclofenac , MRI completed, Neurosurgery consulted, not a good surgical candidate              Hgb a1c 6.5 12/21/23,  prediabetic, steroid  induced.              The right middle finger stiffness, difficulty eating.  Hypokalemia,  on Kcl, K 4.0 08/04/24             Hx of sinus tachycardia, heart rate is in control.              Hx of aute sigmoid diverticulitis with pneumoperitoneum, f/u Dr. Stevie, diarrhea on and off, negative C-diff in the past.              COPD/Asthma Epinephrine  prn, Allegra , Singulair , DuoNeb             RA/fibromyalgia, taking steroids, Tylenol ,  Tramadol , TSH 1.777 08/04/24             GERD, off acid reducer,  Hgb 10.1 08/08/24             Venous insufficiency/Edema , takes Torsemide, Kcl.             OP Rheumatology, off Alendronate , on Vit D, t score -1.8 06/06/21,  11/06/2023 declined DEXA             Hyperlipidemia, takes Atorvastatin , LDL 77 06/05/23             Vit B12 deficiency, Hx of  Vit B12 po, Vit B12 235 06/20/21             CT left adnexa cyst 11/2020, MRI 05/08/21,  05/22/22 renal US  shoed No evidence of renal mass or hydronephrosis.             Gout, R  ankle, stable, on allopurinol. Uric 6.5 02/05/24             Hx of DVT, venous US  BLE was negative for DVT while in hospital. Off Eliquis .              HTN, on Amlodipine . Bun/creat 21/0.98 08/06/24  Constipation, taking Colace. Past Medical History:  Diagnosis Date   Adrenal failure (HCC)    Arthritis    Asthma    Cancer (HCC)    Cataract    Closed nondisplaced fracture of fifth right metatarsal bone 09/18/2017   COPD (chronic obstructive pulmonary disease) (HCC)    Diverticulitis    Per patient   E coli bacteremia    Fibromyalgia 2008   GERD (gastroesophageal reflux disease) 12/18/2016   HCAP (healthcare-associated pneumonia) 02/03/2018   Hyperlipidemia 10/13/2021   Osteoporosis    RA (rheumatoid arthritis) (HCC)    Recurrent upper respiratory infection (URI)    Sepsis due to urinary tract infection (HCC) 01/18/2018   Urticaria    Past Surgical History:  Procedure Laterality Date   BACK SURGERY     BILATERAL CARPAL TUNNEL  RELEASE  2005   right and left   CATARACT EXTRACTION, BILATERAL  2004   right and left   CERVICAL FUSION  2011,2010,2008   2 disks   HEEL SPUR SURGERY  2004   lower back fusion  2011   Fusion of 3-4 and 4-5 lower back   RADIOFREQUENCY ABLATION  2020   ROTATOR CUFF REPAIR  [id number]   SQUAMOUS CELL CARCINOMA EXCISION     TONSILLECTOMY AND ADENOIDECTOMY  1947   TOTAL SHOULDER ARTHROPLASTY      Allergies  Allergen Reactions   Adhesive [Tape] Rash   Celebrex [Celecoxib] Hives   Ciprofibrate Nausea Only   Cymbalta [Duloxetine Hcl] Swelling   Gabitril [Tiagabine] Swelling   Lyrica [Pregabalin] Swelling   Neurontin [Gabapentin] Swelling   Nexium  [Esomeprazole ] Rash   Nsaids Rash   Shrimp [Shellfish Allergy] Anaphylaxis    Per patient shrimp only   Azactam  [Aztreonam ]     Hand swelling    Azelastine  Hcl     Rash    Ciprofloxacin  Other (See Comments)    dizziness   Claritin  [Loratadine ]     Irritability Nervousness    Methotrexate  Derivatives    Nasacort  [Triamcinolone ]     Dizzy    Olopatadine  Other (See Comments)    Pain and lethargy    Other    Sulfamethizole Other (See Comments)    unknown   Zantac [Ranitidine Hcl] Other (See Comments)    unknown   Claritin -D 12 Hour [Loratadine -Pseudoephedrine Er] Anxiety   Keflex  [Cephalexin ] Nausea And Vomiting    Tolerated Ancef    Penicillins Rash    Injection site reaction. Tolerated cefepime  in past. Also reports tolerating a penicillin infusion after this initial rxn ~20 yrs ago.  Has patient had a PCN reaction causing immediate rash, facial/tongue/throat swelling, SOB or lightheadedness with hypotension: No Has patient had a PCN reaction causing severe rash involving mucus membranes or skin necrosis: No Has patient had a PCN reaction that required hospitalization No Has patient had a PCN reaction occurring within the last 10 years: No     Sulfa Antibiotics Nausea And Vomiting    Outpatient Encounter Medications as of  08/12/2024  Medication Sig   acetaminophen  (TYLENOL ) 325 MG tablet Take 650 mg by mouth 3 (three) times daily.   allopurinol (ZYLOPRIM) 100 MG tablet Take 100 mg by mouth daily at  12 noon.   amLODipine  (NORVASC ) 5 MG tablet Take 5 mg by mouth daily at 12 noon.   atorvastatin  (LIPITOR) 20 MG tablet TAKE 1 TABLET BY MOUTH EVERY DAY   Biotin  5 MG TBDP Take 1 tablet (5 mg total) by mouth daily.   cholecalciferol  (VITAMIN D3) 25 MCG (1000 UNIT) tablet Take 1,000 Units by mouth daily at 12 noon.   cycloSPORINE  (RESTASIS ) 0.05 % ophthalmic emulsion Place 1 drop into both eyes 2 (two) times daily.   D-Mannose 500 MG CAPS Take 500 mg by mouth daily.   diclofenac  Sodium (VOLTAREN ) 1 % GEL APPLY 2 TO 4 GRAMS TOPICALLY TO AFFECTED JOINTS UP TO 4 TIMES DAILY   docusate sodium  (COLACE) 100 MG capsule Take 1 capsule (100 mg total) by mouth 2 (two) times daily.   doxycycline  (VIBRA -TABS) 100 MG tablet Take 1 tablet (100 mg total) by mouth every 12 (twelve) hours for 6 days.   fexofenadine  (ALLEGRA ) 60 MG tablet Take 60 mg by mouth daily.   ipratropium-albuterol  (DUONEB) 0.5-2.5 (3) MG/3ML SOLN Take 3 mLs by nebulization as needed.   lansoprazole  (PREVACID ) 30 MG capsule TAKE 1 CAPSULE BY MOUTH ONCE DAILY AT NOON   lidocaine  4 % Place 1 patch onto the skin daily. Apply to right ankle   montelukast  (SINGULAIR ) 10 MG tablet TAKE 1 TABLET BY MOUTH EVERYDAY AT BEDTIME   ondansetron  (ZOFRAN ) 4 MG tablet Take 4 mg by mouth as needed for nausea or vomiting.   predniSONE  (DELTASONE ) 5 MG tablet Take 5 mg by mouth daily at 12 noon.   saccharomyces boulardii (FLORASTOR) 250 MG capsule Take 250 mg by mouth 2 (two) times daily.   Torsemide 40 MG TABS Take 40 mg by mouth daily at 12 noon.   traMADol  (ULTRAM ) 50 MG tablet Take 1 tablet (50 mg total) by mouth every 6 (six) hours as needed for up to 5 days (pain).   No facility-administered encounter medications on file as of 08/12/2024.    Review of Systems  Constitutional:   Negative for appetite change, fatigue and fever.  HENT:  Negative for congestion, sinus pressure and trouble swallowing.   Eyes:  Negative for visual disturbance.  Respiratory:  Positive for cough and shortness of breath. Negative for chest tightness and wheezing.        DOE, occasional yellow phlegm in am, hacking cough.   Cardiovascular:  Negative for leg swelling.  Gastrointestinal:  Negative for abdominal pain and constipation.  Genitourinary:  Negative for dysuria, frequency and urgency.       Incontinent of urine.   Musculoskeletal:  Positive for arthralgias, back pain, gait problem and myalgias.       Right lower back hip pain, travels down to the right leg. Left groin/hip region pain with movement, able to walk if Tramadol  use.   Skin:  Negative for color change.  Neurological:  Negative for speech difficulty, weakness and headaches.  Psychiatric/Behavioral:  Negative for confusion and sleep disturbance. The patient is not nervous/anxious.     Immunization History  Administered Date(s) Administered   Fluad Quad(high Dose 65+) 09/30/2020   INFLUENZA, HIGH DOSE SEASONAL PF 09/24/2015, 08/24/2016, 08/16/2018, 09/08/2019, 09/24/2020, 10/04/2021, 09/12/2023   Influenza,inj,Quad PF,6+ Mos 08/11/2016   Influenza-Unspecified 10/04/2012, 08/19/2013, 08/28/2014, 11/12/2017, 10/05/2022   Moderna Covid-19 Vaccine Bivalent Booster 66yrs & up 10/04/2021, 09/26/2023   Moderna Sars-Covid-2 Vaccination 12/15/2019, 01/12/2020, 10/19/2020   PNEUMOCOCCAL CONJUGATE-20 10/04/2021   PPD Test 03/01/2018   Pneumococcal Conjugate-13 09/13/2015, 06/26/2017   Pneumococcal Polysaccharide-23  12/11/2009   Tdap 04/04/2023   Zoster Recombinant(Shingrix) 01/28/2019, 09/02/2019   Pertinent  Health Maintenance Due  Topic Date Due   INFLUENZA VACCINE  09/09/2024 (Originally 07/11/2024)   DEXA SCAN  Completed      01/31/2023    1:08 PM 02/19/2023    2:29 PM 03/06/2023   11:57 AM 03/13/2023   10:48 AM 03/22/2023    10:53 AM  Fall Risk  Falls in the past year? 0 1 0 0 0  Was there an injury with Fall? 0 1 0 0 0  Fall Risk Category Calculator 0 3 0 0 0  Patient at Risk for Falls Due to Impaired balance/gait;Impaired mobility History of fall(s);Impaired balance/gait History of fall(s);Impaired balance/gait History of fall(s);Impaired balance/gait History of fall(s);Impaired balance/gait  Fall risk Follow up Falls evaluation completed Falls evaluation completed Falls evaluation completed Falls evaluation completed Falls evaluation completed   Functional Status Survey:    Vitals:   08/12/24 0939  BP: 123/62  Pulse: 65  Weight: 176 lb (79.8 kg)  Height: 5' 5 (1.651 m)   Body mass index is 29.29 kg/m. Physical Exam Vitals and nursing note reviewed.  Constitutional:      Appearance: Normal appearance.  HENT:     Head: Normocephalic and atraumatic.     Nose: Nose normal.     Mouth/Throat:     Mouth: Mucous membranes are moist.     Pharynx: No oropharyngeal exudate or posterior oropharyngeal erythema.  Eyes:     Extraocular Movements: Extraocular movements intact.     Conjunctiva/sclera: Conjunctivae normal.     Pupils: Pupils are equal, round, and reactive to light.  Cardiovascular:     Rate and Rhythm: Normal rate and regular rhythm.     Heart sounds: No murmur heard. Pulmonary:     Effort: Pulmonary effort is normal.     Breath sounds: Rales present. No wheezing or rhonchi.     Comments: Decreased air entry Bibasilar rales.  Abdominal:     General: Bowel sounds are normal.     Palpations: Abdomen is soft.     Tenderness: There is no abdominal tenderness.  Musculoskeletal:     Cervical back: Normal range of motion and neck supple.     Right lower leg: No edema.     Left lower leg: No edema.  Skin:    General: Skin is warm and dry.     Comments: Lots of moles. Chronic erythema venous insufficiency skin changes BLE. Scabbed area right intergluteal cleft.   Neurological:      General: No focal deficit present.     Mental Status: She is alert and oriented to person, place, and time. Mental status is at baseline.     Gait: Gait abnormal.  Psychiatric:        Mood and Affect: Mood normal.        Behavior: Behavior normal.        Thought Content: Thought content normal.        Judgment: Judgment normal.     Labs reviewed: Recent Labs    12/21/23 1639 12/22/23 0338 08/04/24 1854 08/05/24 0303 08/06/24 0308  NA  --    < > 131* 139 141  K  --    < > 4.0 4.3 3.6  CL  --    < > 98 108 109  CO2  --    < > 23 21* 19*  GLUCOSE  --    < > 137* 162* 178*  BUN  --    < >  18 16 21   CREATININE 1.07*   < > 1.49* 0.92 0.98  CALCIUM   --    < > 8.7* 8.5* 8.5*  MG 1.6*   < > 2.1 1.8 2.1  PHOS 3.6  --   --  3.0  --    < > = values in this interval not displayed.   Recent Labs    12/26/23 0418 05/23/24 0000 08/04/24 1854 08/05/24 0303  AST 52* 16 25 25   ALT 57* 11 17 15   ALKPHOS 61 82 89 74  BILITOT 0.8  --  1.2 0.7  PROT 6.1*  --  7.2 6.3*  ALBUMIN  2.8* 3.6 3.2* 2.5*   Recent Labs    08/06/24 0308 08/07/24 0304 08/08/24 0812  WBC 18.3* 19.2* 15.4*  NEUTROABS 16.9* 17.3* 11.2*  HGB 8.9* 9.5* 10.1*  HCT 30.5* 30.8* 33.0*  MCV 94.1 93.6 92.7  PLT 214 228 245   Lab Results  Component Value Date   TSH 1.777 08/04/2024   Lab Results  Component Value Date   HGBA1C 6.5 11/28/2023   Lab Results  Component Value Date   CHOL 151 06/05/2023   HDL 47 06/05/2023   LDLCALC 77 06/05/2023   TRIG 174 (A) 06/05/2023   CHOLHDL 2.6 04/06/2020    Significant Diagnostic Results in last 30 days:  CT Angio Chest Pulmonary Embolism (PE) W or WO Contrast Result Date: 08/05/2024 CLINICAL DATA:  Fever and shortness of breath for 1 day EXAM: CT ANGIOGRAPHY CHEST WITH CONTRAST TECHNIQUE: Multidetector CT imaging of the chest was performed using the standard protocol during bolus administration of intravenous contrast. Multiplanar CT image reconstructions and MIPs  were obtained to evaluate the vascular anatomy. RADIATION DOSE REDUCTION: This exam was performed according to the departmental dose-optimization program which includes automated exposure control, adjustment of the mA and/or kV according to patient size and/or use of iterative reconstruction technique. CONTRAST:  60mL OMNIPAQUE  IOHEXOL  350 MG/ML SOLN COMPARISON:  Chest x-ray from the previous day. FINDINGS: Cardiovascular: Thoracic aorta shows atherosclerotic calcifications without aneurysmal dilatation. No cardiac enlargement is noted. The pulmonary artery shows a normal branching pattern bilaterally. No filling defect to suggest pulmonary embolism is noted. Mild coronary calcifications are noted. Mediastinum/Nodes: Thoracic inlet is within normal limits. No hilar or mediastinal adenopathy is noted. Scattered small mediastinal nodes are seen. The esophagus as visualized is within normal limits. Lungs/Pleura: Lungs are well aerated bilaterally. New bilateral lower lobe consolidation is noted likely representing acute pneumonia. Scattered multiple pulmonary nodules are identified bilaterally similar to that seen on prior exam. Index nodule on the right lies in the upper lobe and measures approximately 10 mm stable in appearance. This is best seen on image number 67 of series 6. Index nodule on the left lies adjacent to the mediastinum with some apparent cavitation. This measures up to 17 mm also stable from the prior exam. Upper Abdomen: Visualized upper abdomen is unremarkable. Musculoskeletal: No chest wall abnormality. No acute or significant osseous findings. Review of the MIP images confirms the above findings. IMPRESSION: No evidence of pulmonary embolism. New bilateral lower lobe consolidation consistent with multifocal pneumonia. Multiple pulmonary nodules which appears stable from the prior exam. Although metastatic disease deserves consideration these have been present for several previous exams suggesting  a more benign etiology. Electronically Signed   By: Oneil Devonshire M.D.   On: 08/05/2024 01:00   DG Chest Port 1 View Result Date: 08/04/2024 CLINICAL DATA:  Questionable sepsis - evaluate for abnormality EXAM: PORTABLE CHEST  1 VIEW COMPARISON:  Chest x-ray 12/21/2023 FINDINGS: The heart and mediastinal contours are unchanged. Atherosclerotic plaque. No focal consolidation. No pulmonary edema. No pleural effusion. No pneumothorax. No acute osseous abnormality.  Cervical spine surgical hardware. IMPRESSION: 1. No active disease. 2.  Aortic Atherosclerosis (ICD10-I70.0). Electronically Signed   By: Morgane  Naveau M.D.   On: 08/04/2024 19:55    Assessment/Plan COPD with acute exacerbation (HCC) Hospitalized 08/04/2024 to 08/08/2024 for CAP, severe sepsis with WBC 20.3, fever(101.2).  CT chest showed bilateral lower lobe consolidation consistent with multiple focal pneumonia.  Culture was negative.  Fully treated with broad spectrum antibiotics and improved tachycardia, tachypnea, fever, and leukocytosis.  Doxycycline  for additional 6 days to be completed in skilled nursing facility for total 10 days course.   Leukocytosis, wbc down to 15.4 08/08/24   Updated CBC, BMP   Continue Epinephrine  prn, Allegra , Singulair , DuoNeb  Chronic diastolic CHF (congestive heart failure) (HCC) Euvolemic,  DOE, on and off wheezing, cough, occasional yellow phlegm in am. 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  Diclofenac , MRI completed, Neurosurgery consulted, not a good surgical candidate   Prediabetes Hgb a1c 6.5 12/21/23,  prediabetic, steroid induced.  Update HgbA1c  Rheumatoid arthritis (HCC) RA/fibromyalgia, taking steroids, Tylenol ,  Tramadol , TSH 1.777 08/04/24  GERD (gastroesophageal reflux disease) Stable,  off acid reducer,   Hgb 10.1 08/08/24  Age-related osteoporosis without current pathological fracture  off Alendronate , on Vit D, t score -1.8 06/06/21, 11/06/2023 declined DEXA  HLD (hyperlipidemia) takes Atorvastatin , LDL 77 06/05/23, update lipid panel  B12 deficiency  Hx of  Vit B12 po, Vit B12 235 06/20/21, update of vitamin B12  Gout No recent flareups, R ankle, stable, on allopurinol. Uric 6.5 02/05/24  Essential hypertension Blood pressure is controlled,  on Amlodipine . Bun/creat 21/0.98 08/06/24     Family/ staff Communication: Plan of care reviewed with the patient and charge nurse  Labs/tests ordered: CBC/differential, CMP/eGFR, lipid panel, vitamin B12 level, Hgb A1c

## 2024-08-12 NOTE — Assessment & Plan Note (Addendum)
 Hospitalized 08/04/2024 to 08/08/2024 for CAP, severe sepsis with WBC 20.3, fever(101.2).  CT chest showed bilateral lower lobe consolidation consistent with multiple focal pneumonia.  Culture was negative.  Fully treated with broad spectrum antibiotics and improved tachycardia, tachypnea, fever, and leukocytosis.  Doxycycline  for additional 6 days to be completed in skilled nursing facility for total 10 days course.   Leukocytosis, wbc down to 15.4 08/08/24   Updated CBC, BMP   Continue Epinephrine  prn, Allegra , Singulair , DuoNeb

## 2024-08-12 NOTE — Assessment & Plan Note (Signed)
 Stable,  off acid reducer,  Hgb 10.1 08/08/24

## 2024-08-12 NOTE — Assessment & Plan Note (Signed)
 off Alendronate , on Vit D, t score -1.8 06/06/21, 11/06/2023 declined DEXA

## 2024-08-12 NOTE — Assessment & Plan Note (Signed)
 Hx of  Vit B12 po, Vit B12 235 06/20/21, update of vitamin B12

## 2024-08-12 NOTE — Assessment & Plan Note (Addendum)
 takes Atorvastatin , LDL 77 06/05/23, update lipid panel

## 2024-08-12 NOTE — Assessment & Plan Note (Signed)
 RA/fibromyalgia, taking steroids, Tylenol ,  Tramadol , TSH 1.777 08/04/24

## 2024-08-12 NOTE — Assessment & Plan Note (Signed)
 No recent flareups, R ankle, stable, on allopurinol. Uric 6.5 02/05/24

## 2024-08-12 NOTE — Assessment & Plan Note (Signed)
 Blood pressure is controlled,  on Amlodipine . Bun/creat 21/0.98 08/06/24

## 2024-08-12 NOTE — Assessment & Plan Note (Addendum)
 Hgb a1c 6.5 12/21/23,  prediabetic, steroid induced.  Update HgbA1c

## 2024-08-12 NOTE — Assessment & Plan Note (Signed)
 Euvolemic,  DOE, on and off wheezing, cough, occasional yellow phlegm in am. EF 60-65% 05/22/22, on Torsemide, Amlodipine 

## 2024-08-14 ENCOUNTER — Encounter: Payer: Self-pay | Admitting: Sports Medicine

## 2024-08-14 ENCOUNTER — Non-Acute Institutional Stay (SKILLED_NURSING_FACILITY): Payer: Self-pay | Admitting: Sports Medicine

## 2024-08-14 DIAGNOSIS — J189 Pneumonia, unspecified organism: Secondary | ICD-10-CM

## 2024-08-14 DIAGNOSIS — K219 Gastro-esophageal reflux disease without esophagitis: Secondary | ICD-10-CM

## 2024-08-14 DIAGNOSIS — J309 Allergic rhinitis, unspecified: Secondary | ICD-10-CM

## 2024-08-14 DIAGNOSIS — I10 Essential (primary) hypertension: Secondary | ICD-10-CM

## 2024-08-14 DIAGNOSIS — M0579 Rheumatoid arthritis with rheumatoid factor of multiple sites without organ or systems involvement: Secondary | ICD-10-CM | POA: Diagnosis not present

## 2024-08-14 DIAGNOSIS — J45909 Unspecified asthma, uncomplicated: Secondary | ICD-10-CM

## 2024-08-14 DIAGNOSIS — I5032 Chronic diastolic (congestive) heart failure: Secondary | ICD-10-CM | POA: Diagnosis not present

## 2024-08-14 LAB — COMPREHENSIVE METABOLIC PANEL WITH GFR
Albumin: 3.4 — AB (ref 3.5–5.0)
Calcium: 8.8 (ref 8.7–10.7)
Globulin: 2.9
eGFR: 65

## 2024-08-14 LAB — BASIC METABOLIC PANEL WITH GFR
BUN: 22 — AB (ref 4–21)
CO2: 33 — AB (ref 13–22)
Chloride: 101 (ref 99–108)
Creatinine: 0.9 (ref 0.5–1.1)
Glucose: 68
Potassium: 2.7 meq/L — AB (ref 3.5–5.1)
Sodium: 144 (ref 137–147)

## 2024-08-14 LAB — CBC: RBC: 3.77 — AB (ref 3.87–5.11)

## 2024-08-14 LAB — LIPID PANEL
Cholesterol: 116 (ref 0–200)
HDL: 38 (ref 35–70)
LDL Cholesterol: 57
LDl/HDL Ratio: 3.1
Triglycerides: 126 (ref 40–160)

## 2024-08-14 LAB — HEPATIC FUNCTION PANEL
ALT: 15 U/L (ref 7–35)
AST: 15 (ref 13–35)
Alkaline Phosphatase: 70 (ref 25–125)
Bilirubin, Total: 0.5

## 2024-08-14 LAB — CBC AND DIFFERENTIAL
HCT: 34 — AB (ref 36–46)
Hemoglobin: 10.8 — AB (ref 12.0–16.0)
Platelets: 321 K/uL (ref 150–400)
WBC: 9.7

## 2024-08-14 LAB — HEMOGLOBIN A1C: Hemoglobin A1C: 6.5

## 2024-08-14 NOTE — Progress Notes (Signed)
 Provider:  Dr. Jackalyn Blazing Location:  [facility] Place of Service:   Skilled care   PCP: Mast, [name], NP [name], [name], [name]  Extended Emergency Contact Information Primary Emergency Contact: Debrew,Jacqueline  [place] Home Phone: [phone] Mobile Phone: [phone] Relation: Daughter  Goals of Care: Advanced Directive information    08/12/2024    9:50 AM  Advanced Directives  Does Patient Have a Medical Advance Directive? Yes  Type of Estate agent of [place];Living will;Out of facility DNR (pink MOST or yellow form)  Does patient want to make changes to medical advance directive? No - Patient declined  Copy of Healthcare Power of Attorney in Chart? Yes - validated most recent copy scanned in chart (See row information)      No chief complaint on file.      History of Present Illness   89 year old female with a history of COPD, asthma, chronic diastolic heart failure, rheumatoid arthritis on chronic prednisone , hypertension, hyperlipidemia is evaluated for admission to skilled care nursing after her recent admission for multifocal pneumonia. Patient seen and examined in her room.  She is sitting in the recliner chair.  She seems pleasant and comfortable and does not appear to be in distress. Patient complains of dry cough.  She is able to speak in full sentences.  She is on room air saturating 96%. Patient states she wants to go back to her assisted living apartment. Patient denies chest pain, shortness of breath, abdominal pain, nausea, vomiting, dysuria, hematuria, bloody or dark stools. Patient is able to transfer herself independently from bed to chair.  She ambulates with a walker.    Past Medical History:  Diagnosis Date   Adrenal failure (HCC)    Arthritis    Asthma    Cancer (HCC)    Cataract    Closed nondisplaced fracture of fifth right  metatarsal bone 09/18/2017   COPD (chronic obstructive pulmonary disease) (HCC)    Diverticulitis    Per patient   E coli bacteremia    Fibromyalgia 2008   GERD (gastroesophageal reflux disease) 12/18/2016   HCAP (healthcare-associated pneumonia) 02/03/2018   Hyperlipidemia 10/13/2021   Osteoporosis    RA (rheumatoid arthritis) (HCC)    Recurrent upper respiratory infection (URI)    Sepsis due to urinary tract infection (HCC) 01/18/2018   Urticaria    Past Surgical History:  Procedure Laterality Date   BACK SURGERY     BILATERAL CARPAL TUNNEL RELEASE  2005   right and left   CATARACT EXTRACTION, BILATERAL  2004   right and left   CERVICAL FUSION  2011,2010,2008   2 disks   HEEL SPUR SURGERY  2004   lower back fusion  2011   Fusion of 3-4 and 4-5 lower back   RADIOFREQUENCY ABLATION  2020   ROTATOR CUFF REPAIR  [id number]   SQUAMOUS CELL CARCINOMA EXCISION     TONSILLECTOMY AND ADENOIDECTOMY  1947   TOTAL SHOULDER ARTHROPLASTY      reports that she quit smoking about 57 years ago. Her smoking use included cigarettes. She started smoking about 67 years ago. She has a 7.5 pack-year smoking history. She has never been exposed to tobacco smoke. She has never used smokeless tobacco. She reports that she does not drink alcohol and does not use drugs. Social History   Socioeconomic History   Marital status: Widowed    Spouse name: Not on file  Number of children: 2   Years of education: Not on file   Highest education level: Not on file  Occupational History   Occupation: Retired Child psychotherapist  Tobacco Use   Smoking status: Former    Current packs/day: 0.00    Average packs/day: 0.8 packs/day for 10.0 years (7.5 ttl pk-yrs)    Types: Cigarettes    Start date: 81    Quit date: 1968    Years since quitting: 57.7    Passive exposure: Never   Smokeless tobacco: Never  Vaping Use   Vaping status: Never Used  Substance and Sexual Activity   Alcohol use: No   Drug use:  No   Sexual activity: Not Currently  Other Topics Concern   Not on file  Social History Narrative      Diet:        Do you drink/ eat things with caffeine? Dr. Nunzio 2/ day      Marital status: Widowed                              What year were you married ? 1953      Do you live in a house, apartment,assistred living, condo, trailer, etc.)? Apartment      Is it one or more stories?       How many persons live in your home ? 1      Do you have any pets in your home ?(please list) No      Highest Level of education completed: PhD       Current or past profession: Paramedic, Child psychotherapist, Special Educator       Do you exercise?   No                           Type & how often       ADVANCED DIRECTIVES (Please bring copies)      Do you have a living will? Yes      Do you have a DNR form?                       If not, do you want to discuss one? Yes      Do you have signed POA?HPOA forms?                 If so, please bring to your appointment Tes      FUNCTIONAL STATUS- To be completed by Spouse / child / Staff       Do you have difficulty bathing or dressing yourself ? No      Do you have difficulty preparing food or eating ? No      Do you have difficulty managing your mediation ? No      Do you have difficulty managing your finances ? No      Do you have difficulty affording your medication ? No      Social Drivers of Corporate investment banker Strain: Not on file  Food Insecurity: No Food Insecurity (12/21/2023)   Hunger Vital Sign    Worried About Running Out of Food in the Last Year: Never true    Ran Out of Food in the Last Year: Never true  Transportation Needs: No Transportation Needs (12/21/2023)   PRAPARE - Administrator, Civil Service (Medical): No    Lack  of Transportation (Non-Medical): No  Physical Activity: Not on file  Stress: Not on file  Social Connections: Not on file  Intimate Partner Violence: Not At Risk (12/21/2023)    Humiliation, Afraid, Rape, and Kick questionnaire    Fear of Current or Ex-Partner: No    Emotionally Abused: No    Physically Abused: No    Sexually Abused: No    Functional Status Survey:    Family History  Problem Relation Age of Onset   Heart attack Maternal Grandmother    Heart attack Paternal Grandfather    Breast cancer Mother 61   Diabetes Father    Heart disease Father    Congestive Heart Failure Father 14       Died from   Allergic rhinitis Neg Hx    Asthma Neg Hx    Eczema Neg Hx    Urticaria Neg Hx     Health Maintenance  Topic Date Due   INFLUENZA VACCINE  09/09/2024 (Originally 07/11/2024)   COVID-19 Vaccine (6 - 2025-26 season) 09/10/2024 (Originally 08/11/2024)   DTaP/Tdap/Td (2 - Td or Tdap) 04/03/2033   Pneumococcal Vaccine: 50+ Years  Completed   DEXA SCAN  Completed   Zoster Vaccines- Shingrix  Completed   HPV VACCINES  Aged Out   Meningococcal B Vaccine  Aged Out    Allergies  Allergen Reactions   Adhesive [Tape] Rash   Celebrex [Celecoxib] Hives   Ciprofibrate Nausea Only   Cymbalta [Duloxetine Hcl] Swelling   Gabitril [Tiagabine] Swelling   Lyrica [Pregabalin] Swelling   Neurontin [Gabapentin] Swelling   Nexium  [Esomeprazole ] Rash   Nsaids Rash   Shrimp [Shellfish Allergy] Anaphylaxis    Per patient shrimp only   Azactam  [Aztreonam ]     Hand swelling    Azelastine  Hcl     Rash    Ciprofloxacin  Other (See Comments)    dizziness   Claritin  [Loratadine ]     Irritability Nervousness    Methotrexate  Derivatives    Nasacort  [Triamcinolone ]     Dizzy    Olopatadine  Other (See Comments)    Pain and lethargy    Other    Sulfamethizole Other (See Comments)    unknown   Zantac [Ranitidine Hcl] Other (See Comments)    unknown   Claritin -D 12 Hour [Loratadine -Pseudoephedrine Er] Anxiety   Keflex  [Cephalexin ] Nausea And Vomiting    Tolerated Ancef    Penicillins Rash    Injection site reaction. Tolerated cefepime  in past. Also reports  tolerating a penicillin infusion after this initial rxn ~20 yrs ago.  Has patient had a PCN reaction causing immediate rash, facial/tongue/throat swelling, SOB or lightheadedness with hypotension: No Has patient had a PCN reaction causing severe rash involving mucus membranes or skin necrosis: No Has patient had a PCN reaction that required hospitalization No Has patient had a PCN reaction occurring within the last 10 years: No     Sulfa Antibiotics Nausea And Vomiting    Outpatient Encounter Medications as of 08/14/2024  Medication Sig   acetaminophen  (TYLENOL ) 325 MG tablet Take 650 mg by mouth 3 (three) times daily.   allopurinol (ZYLOPRIM) 100 MG tablet Take 100 mg by mouth daily at 12 noon.   amLODipine  (NORVASC ) 5 MG tablet Take 5 mg by mouth daily at 12 noon.   atorvastatin  (LIPITOR) 20 MG tablet TAKE 1 TABLET BY MOUTH EVERY DAY   Biotin  5 MG TBDP Take 1 tablet (5 mg total) by mouth daily.   cholecalciferol  (VITAMIN D3) 25 MCG (1000 UNIT) tablet  Take 1,000 Units by mouth daily at 12 noon.   cycloSPORINE  (RESTASIS ) 0.05 % ophthalmic emulsion Place 1 drop into both eyes 2 (two) times daily.   D-Mannose 500 MG CAPS Take 500 mg by mouth daily.   diclofenac  Sodium (VOLTAREN ) 1 % GEL APPLY 2 TO 4 GRAMS TOPICALLY TO AFFECTED JOINTS UP TO 4 TIMES DAILY   docusate sodium  (COLACE) 100 MG capsule Take 1 capsule (100 mg total) by mouth 2 (two) times daily.   doxycycline  (VIBRA -TABS) 100 MG tablet Take 1 tablet (100 mg total) by mouth every 12 (twelve) hours for 6 days.   fexofenadine  (ALLEGRA ) 60 MG tablet Take 60 mg by mouth daily.   ipratropium-albuterol  (DUONEB) 0.5-2.5 (3) MG/3ML SOLN Take 3 mLs by nebulization as needed.   lansoprazole  (PREVACID ) 30 MG capsule TAKE 1 CAPSULE BY MOUTH ONCE DAILY AT NOON   lidocaine  4 % Place 1 patch onto the skin daily. Apply to right ankle   montelukast  (SINGULAIR ) 10 MG tablet TAKE 1 TABLET BY MOUTH EVERYDAY AT BEDTIME   ondansetron  (ZOFRAN ) 4 MG tablet  Take 4 mg by mouth as needed for nausea or vomiting.   predniSONE  (DELTASONE ) 5 MG tablet Take 5 mg by mouth daily at 12 noon.   saccharomyces boulardii (FLORASTOR) 250 MG capsule Take 250 mg by mouth 2 (two) times daily.   Torsemide 40 MG TABS Take 40 mg by mouth daily at 12 noon.   No facility-administered encounter medications on file as of 08/14/2024.    Review of Systems  Constitutional:  Negative for chills and fever.  HENT:  Negative for sinus pressure and sore throat.   Respiratory:  Positive for cough. Negative for shortness of breath and wheezing.   Cardiovascular:  Negative for chest pain, palpitations and leg swelling.  Gastrointestinal:  Negative for abdominal distention, abdominal pain, blood in stool, constipation, diarrhea, nausea and vomiting.  Genitourinary:  Negative for dysuria, frequency and urgency.  Neurological:  Negative for dizziness.  Psychiatric/Behavioral:  Negative for confusion.     There were no vitals filed for this visit. There is no height or weight on file to calculate BMI. BP Readings from Last 3 Encounters:  08/12/24 123/62  08/08/24 (!) 152/67  08/04/24 (!) 122/59   Wt Readings from Last 3 Encounters:  08/12/24 176 lb (79.8 kg)  08/06/24 184 lb 8.4 oz (83.7 kg)  08/04/24 165 lb 9.6 oz (75.1 kg)   Physical Exam Constitutional:      Appearance: Normal appearance.  HENT:     Head: Normocephalic and atraumatic.  Cardiovascular:     Rate and Rhythm: Normal rate and regular rhythm.  Pulmonary:     Effort: Pulmonary effort is normal. No respiratory distress.     Breath sounds: Normal breath sounds. No wheezing.  Abdominal:     General: Bowel sounds are normal. There is no distension.     Tenderness: There is no abdominal tenderness. There is no guarding or rebound.     Comments:    Musculoskeletal:        General: No swelling.  Neurological:     Mental Status: She is alert. Mental status is at baseline.     Motor: No weakness.      Labs reviewed: Basic Metabolic Panel: Recent Labs    12/21/23 1639 12/22/23 0338 08/04/24 1854 08/05/24 0303 08/06/24 0308  NA  --    < > 131* 139 141  K  --    < > 4.0 4.3 3.6  CL  --    < >  98 108 109  CO2  --    < > 23 21* 19*  GLUCOSE  --    < > 137* 162* 178*  BUN  --    < > 18 16 21   CREATININE 1.07*   < > 1.49* 0.92 0.98  CALCIUM   --    < > 8.7* 8.5* 8.5*  MG 1.6*   < > 2.1 1.8 2.1  PHOS 3.6  --   --  3.0  --    < > = values in this interval not displayed.   Liver Function Tests: Recent Labs    12/26/23 0418 05/23/24 0000 08/04/24 1854 08/05/24 0303  AST 52* 16 25 25   ALT 57* 11 17 15   ALKPHOS 61 82 89 74  BILITOT 0.8  --  1.2 0.7  PROT 6.1*  --  7.2 6.3*  ALBUMIN  2.8* 3.6 3.2* 2.5*   Recent Labs    12/21/23 1639  LIPASE 20   Recent Labs    12/21/23 1130  AMMONIA 23   CBC: Recent Labs    08/06/24 0308 08/07/24 0304 08/08/24 0812  WBC 18.3* 19.2* 15.4*  NEUTROABS 16.9* 17.3* 11.2*  HGB 8.9* 9.5* 10.1*  HCT 30.5* 30.8* 33.0*  MCV 94.1 93.6 92.7  PLT 214 228 245   Cardiac Enzymes: Recent Labs    08/04/24 1854  CKTOTAL 69   BNP: Invalid input(s): POCBNP Lab Results  Component Value Date   HGBA1C 6.5 11/28/2023   Lab Results  Component Value Date   TSH 1.777 08/04/2024   Lab Results  Component Value Date   VITAMINB12 1,377 (H) 05/26/2022   Lab Results  Component Value Date   FOLATE 26.8 02/06/2018   Lab Results  Component Value Date   IRON 89 02/06/2018   TIBC 281 02/06/2018   FERRITIN 74 02/06/2018    Imaging and Procedures obtained prior to SNF admission: CT Angio Chest Pulmonary Embolism (PE) W or WO Contrast Result Date: 08/05/2024 CLINICAL DATA:  Fever and shortness of breath for 1 day EXAM: CT ANGIOGRAPHY CHEST WITH CONTRAST TECHNIQUE: Multidetector CT imaging of the chest was performed using the standard protocol during bolus administration of intravenous contrast. Multiplanar CT image reconstructions and  MIPs were obtained to evaluate the vascular anatomy. RADIATION DOSE REDUCTION: This exam was performed according to the departmental dose-optimization program which includes automated exposure control, adjustment of the mA and/or kV according to patient size and/or use of iterative reconstruction technique. CONTRAST:  60mL OMNIPAQUE  IOHEXOL  350 MG/ML SOLN COMPARISON:  Chest x-ray from the previous day. FINDINGS: Cardiovascular: Thoracic aorta shows atherosclerotic calcifications without aneurysmal dilatation. No cardiac enlargement is noted. The pulmonary artery shows a normal branching pattern bilaterally. No filling defect to suggest pulmonary embolism is noted. Mild coronary calcifications are noted. Mediastinum/Nodes: Thoracic inlet is within normal limits. No hilar or mediastinal adenopathy is noted. Scattered small mediastinal nodes are seen. The esophagus as visualized is within normal limits. Lungs/Pleura: Lungs are well aerated bilaterally. New bilateral lower lobe consolidation is noted likely representing acute pneumonia. Scattered multiple pulmonary nodules are identified bilaterally similar to that seen on prior exam. Index nodule on the right lies in the upper lobe and measures approximately 10 mm stable in appearance. This is best seen on image number 67 of series 6. Index nodule on the left lies adjacent to the mediastinum with some apparent cavitation. This measures up to 17 mm also stable from the prior exam. Upper Abdomen: Visualized upper abdomen is unremarkable. Musculoskeletal: No  chest wall abnormality. No acute or significant osseous findings. Review of the MIP images confirms the above findings. IMPRESSION: No evidence of pulmonary embolism. New bilateral lower lobe consolidation consistent with multifocal pneumonia. Multiple pulmonary nodules which appears stable from the prior exam. Although metastatic disease deserves consideration these have been present for several previous exams  suggesting a more benign etiology. Electronically Signed   By: Oneil Devonshire M.D.   On: 08/05/2024 01:00   DG Chest Port 1 View Result Date: 08/04/2024 CLINICAL DATA:  Questionable sepsis - evaluate for abnormality EXAM: PORTABLE CHEST 1 VIEW COMPARISON:  Chest x-ray 12/21/2023 FINDINGS: The heart and mediastinal contours are unchanged. Atherosclerotic plaque. No focal consolidation. No pulmonary edema. No pleural effusion. No pneumothorax. No acute osseous abnormality.  Cervical spine surgical hardware. IMPRESSION: 1. No active disease. 2.  Aortic Atherosclerosis (ICD10-I70.0). Electronically Signed   By: Morgane  Naveau M.D.   On: 08/04/2024 19:55    Assessment and Plan Assessment & Plan  COPD with acute exacerbation secondary to bilateral lower lobe pneumonia Patient complains of dry cough Denies fevers, chills, shortness of breath. She is able to speak in full sentences, does not appear to be in distress. She is saturating 96% on room air Continue with doxycycline   History of asthma, seasonal allergies Continue with Allegra , Singulair .   History of rheumatoid arthritis Continue with prednisone  5 mg Continue with tramadol  50 mg every 6 as needed  History of CHF Euvolemic on exam Continue with torsemide     Hypertension Blood pressure 158/72 Continue with amlodipine  5 mg  Hyperlipidemia Continue with Lipitor 20 mg  GERD Continue with lansoprazole .    30 min Total time spent for obtaining history,  performing a medically appropriate examination and evaluation, reviewing the tests, documenting clinical information in the electronic or other health record,  ,care coordination (not separately reported)

## 2024-08-19 ENCOUNTER — Encounter: Payer: Self-pay | Admitting: Nurse Practitioner

## 2024-08-19 ENCOUNTER — Non-Acute Institutional Stay (SKILLED_NURSING_FACILITY): Payer: Self-pay | Admitting: Nurse Practitioner

## 2024-08-19 DIAGNOSIS — I10 Essential (primary) hypertension: Secondary | ICD-10-CM

## 2024-08-19 DIAGNOSIS — R7303 Prediabetes: Secondary | ICD-10-CM | POA: Diagnosis not present

## 2024-08-19 DIAGNOSIS — J441 Chronic obstructive pulmonary disease with (acute) exacerbation: Secondary | ICD-10-CM

## 2024-08-19 DIAGNOSIS — M810 Age-related osteoporosis without current pathological fracture: Secondary | ICD-10-CM

## 2024-08-19 DIAGNOSIS — M1A9XX0 Chronic gout, unspecified, without tophus (tophi): Secondary | ICD-10-CM

## 2024-08-19 DIAGNOSIS — I5032 Chronic diastolic (congestive) heart failure: Secondary | ICD-10-CM

## 2024-08-19 DIAGNOSIS — M0579 Rheumatoid arthritis with rheumatoid factor of multiple sites without organ or systems involvement: Secondary | ICD-10-CM | POA: Diagnosis not present

## 2024-08-19 DIAGNOSIS — E782 Mixed hyperlipidemia: Secondary | ICD-10-CM | POA: Diagnosis not present

## 2024-08-19 DIAGNOSIS — E538 Deficiency of other specified B group vitamins: Secondary | ICD-10-CM

## 2024-08-19 DIAGNOSIS — M5116 Intervertebral disc disorders with radiculopathy, lumbar region: Secondary | ICD-10-CM

## 2024-08-19 DIAGNOSIS — K219 Gastro-esophageal reflux disease without esophagitis: Secondary | ICD-10-CM | POA: Diagnosis not present

## 2024-08-19 NOTE — Assessment & Plan Note (Signed)
 Stable,  off acid reducer,  Hgb 10.1 08/08/24

## 2024-08-19 NOTE — Assessment & Plan Note (Signed)
 prediabetic, steroid induced.  Diet controlled

## 2024-08-19 NOTE — Progress Notes (Unsigned)
 Location:   SNF [facility] Room Number: 40B Place of Service:  SNF (31) Provider: Larwance Taimur Fier NP  [name], NP  Patient Care Team: Modelle Vollmer X, [name]  Extended Emergency Contact Information Primary Emergency Contact: Debrew,Jacqueline  [place] Home Phone: [phone] Mobile Phone: [phone] Relation: Daughter  Code Status: DNR Goals of care: Advanced Directive information    08/12/2024    9:50 AM  Advanced Directives  Does Patient Have a Medical Advance Directive? Yes  Type of Estate agent of [place];Living will;Out of facility DNR (pink MOST or yellow form)  Does patient want to make changes to medical advance directive? No - Patient declined  Copy of Healthcare Power of Attorney in Chart? Yes - validated most recent copy scanned in chart (See row information)     Chief Complaint  Patient presents with   Medicare Wellness    Discharge to [facility]    HPI:  Pt is a 89 y.o. female with medical history significant for COPD, CHF, chronic lower back pain, RA, Fibromyalgia, GERD, OP, HLD, HTN was admitted to the skilled nursing facility for therapy following her hospitalization   Hospitalized 08/04/2024 to 08/08/2024 for CAP, severe sepsis with WBC 20.3, fever(101.2).  CT chest showed bilateral lower lobe consolidation consistent with multiple focal pneumonia.  Culture was negative.  Fully treated with broad spectrum antibiotics and improved tachycardia, tachypnea, and leukocytosis.  Doxycycline  for additional 6 days to be completed in skilled nursing facility for total 10 days course.                      Leukocytosis, wbc down to 15.4 08/08/24  The patient has been recovered from pneumonia her white count was down to 15.48 2925 from 20.38 2525.  She has not regained physical strength, ADL function, she is stable to return to [facility] where she resides prior to the hospitalization.  She will continue  to receive therapy after discharge from SNF              Chronic diastolic heart failure, DOE, on and off wheezing, cough, occasional yellow phlegm in am. 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  Diclofenac , MRI completed, Neurosurgery consulted, not a good surgical candidate              Hgb a1c 6.5 12/21/23,  prediabetic, steroid induced.              The right middle finger stiffness, difficulty eating.  Hypokalemia,  on Kcl, K 4.0 08/04/24             Hx of sinus tachycardia, heart rate is in control.              Hx of aute sigmoid diverticulitis with pneumoperitoneum, f/u Dr. Stevie, diarrhea on and off, negative C-diff in the past.              COPD/Asthma Epinephrine  prn, Allegra , Singulair , DuoNeb             RA/fibromyalgia, taking steroids, Tylenol ,  Tramadol , TSH 1.777 08/04/24             GERD, off acid reducer,  Hgb 10.1 08/08/24             Venous insufficiency/Edema , takes Torsemide, Kcl.  OP Rheumatology, off Alendronate , on Vit D, t score -1.8 06/06/21,  11/06/2023 declined DEXA             Hyperlipidemia, takes Atorvastatin , LDL 77 06/05/23             Vit B12 deficiency, Hx of  Vit B12 po, Vit B12 235 06/20/21             CT left adnexa cyst 11/2020, MRI 05/08/21,  05/22/22 renal US  shoed No evidence of renal mass or hydronephrosis.             Gout, R ankle, stable, on allopurinol. Uric 6.5 02/05/24             Hx of DVT, venous US  BLE was negative for DVT while in hospital. Off Eliquis .              HTN, on Amlodipine . Bun/creat 21/0.98 08/06/24             Constipation, taking Colace. Past Medical History:  Diagnosis Date   Adrenal failure (HCC)    Arthritis    Asthma    Cancer (HCC)    Cataract    Closed nondisplaced fracture of fifth right metatarsal bone 09/18/2017   COPD (chronic obstructive pulmonary disease) (HCC)    Diverticulitis    Per  patient   E coli bacteremia    Fibromyalgia 2008   GERD (gastroesophageal reflux disease) 12/18/2016   HCAP (healthcare-associated pneumonia) 02/03/2018   Hyperlipidemia 10/13/2021   Osteoporosis    RA (rheumatoid arthritis) (HCC)    Recurrent upper respiratory infection (URI)    Sepsis due to urinary tract infection (HCC) 01/18/2018   Urticaria    Past Surgical History:  Procedure Laterality Date   BACK SURGERY     BILATERAL CARPAL TUNNEL RELEASE  2005   right and left   CATARACT EXTRACTION, BILATERAL  2004   right and left   CERVICAL FUSION  2011,2010,2008   2 disks   HEEL SPUR SURGERY  2004   lower back fusion  2011   Fusion of 3-4 and 4-5 lower back   RADIOFREQUENCY ABLATION  2020   ROTATOR CUFF REPAIR  [id number]   SQUAMOUS CELL CARCINOMA EXCISION     TONSILLECTOMY AND ADENOIDECTOMY  1947   TOTAL SHOULDER ARTHROPLASTY      Allergies  Allergen Reactions   Adhesive [Tape] Rash   Celebrex [Celecoxib] Hives   Ciprofibrate Nausea Only   Cymbalta [Duloxetine Hcl] Swelling   Gabitril [Tiagabine] Swelling   Lyrica [Pregabalin] Swelling   Neurontin [Gabapentin] Swelling   Nexium  [Esomeprazole ] Rash   Nsaids Rash   Shrimp [Shellfish Allergy] Anaphylaxis    Per patient shrimp only   Azactam  [Aztreonam ]     Hand swelling    Azelastine  Hcl     Rash    Ciprofloxacin  Other (See Comments)    dizziness   Claritin  Ingrid.Hey ]     Irritability Nervousness    Methotrexate  Derivatives    Nasacort  [Triamcinolone ]     Dizzy    Olopatadine  Other (See Comments)    Pain and lethargy    Other    Sulfamethizole Other (See Comments)    unknown   Zantac [Ranitidine Hcl] Other (See Comments)    unknown   Claritin -D 12 Hour [Loratadine -Pseudoephedrine Er] Anxiety   Keflex  [Cephalexin ] Nausea And Vomiting    Tolerated Ancef    Penicillins Rash    Injection site reaction. Tolerated cefepime  in past. Also reports tolerating a penicillin infusion after this  initial rxn ~20 yrs  ago.  Has patient had a PCN reaction causing immediate rash, facial/tongue/throat swelling, SOB or lightheadedness with hypotension: No Has patient had a PCN reaction causing severe rash involving mucus membranes or skin necrosis: No Has patient had a PCN reaction that required hospitalization No Has patient had a PCN reaction occurring within the last 10 years: No     Sulfa Antibiotics Nausea And Vomiting    Allergies as of 08/19/2024       Reactions   Adhesive [tape] Rash   Celebrex [celecoxib] Hives   Ciprofibrate Nausea Only   Cymbalta [duloxetine Hcl] Swelling   Gabitril [tiagabine] Swelling   Lyrica [pregabalin] Swelling   Neurontin [gabapentin] Swelling   Nexium  [esomeprazole ] Rash   Nsaids Rash   Shrimp [shellfish Allergy] Anaphylaxis   Per patient shrimp only   Azactam  [aztreonam ]    Hand swelling    Azelastine  Hcl    Rash    Ciprofloxacin  Other (See Comments)   dizziness   Claritin  [loratadine ]    Irritability Nervousness    Methotrexate  Derivatives    Nasacort  [triamcinolone ]    Dizzy    Olopatadine  Other (See Comments)   Pain and lethargy    Other    Sulfamethizole Other (See Comments)   unknown   Zantac [ranitidine Hcl] Other (See Comments)   unknown   Claritin -d 12 Hour [loratadine -pseudoephedrine Er] Anxiety   Keflex  [cephalexin ] Nausea And Vomiting   Tolerated Ancef    Penicillins Rash   Injection site reaction. Tolerated cefepime  in past. Also reports tolerating a penicillin infusion after this initial rxn ~20 yrs ago.  Has patient had a PCN reaction causing immediate rash, facial/tongue/throat swelling, SOB or lightheadedness with hypotension: No Has patient had a PCN reaction causing severe rash involving mucus membranes or skin necrosis: No Has patient had a PCN reaction that required hospitalization No Has patient had a PCN reaction occurring within the last 10 years: No   Sulfa Antibiotics Nausea And Vomiting        Medication List         Accurate as of August 19, 2024 11:59 PM. If you have any questions, ask your nurse or doctor.          acetaminophen  325 MG tablet Commonly known as: TYLENOL  Take 650 mg by mouth 3 (three) times daily.   allopurinol 100 MG tablet Commonly known as: ZYLOPRIM Take 100 mg by mouth daily at 12 noon.   amLODipine  5 MG tablet Commonly known as: NORVASC  Take 5 mg by mouth daily at 12 noon.   atorvastatin  20 MG tablet Commonly known as: LIPITOR TAKE 1 TABLET BY MOUTH EVERY DAY   Biotin  5 MG Tbdp Take 1 tablet (5 mg total) by mouth daily.   cholecalciferol  25 MCG (1000 UNIT) tablet Commonly known as: VITAMIN D3 Take 1,000 Units by mouth daily at 12 noon.   cycloSPORINE  0.05 % ophthalmic emulsion Commonly known as: RESTASIS  Place 1 drop into both eyes 2 (two) times daily.   D-Mannose 500 MG Caps Take 500 mg by mouth daily.   diclofenac  Sodium 1 % Gel Commonly known as: VOLTAREN  APPLY 2 TO 4 GRAMS TOPICALLY TO AFFECTED JOINTS UP TO 4 TIMES DAILY   docusate sodium  100 MG capsule Commonly known as: Colace Take 1 capsule (100 mg total) by mouth 2 (two) times daily.   fexofenadine  60 MG tablet Commonly known as: ALLEGRA  Take 60 mg by mouth daily.   ipratropium-albuterol  0.5-2.5 (3) MG/3ML Soln Commonly known as:  DUONEB Take 3 mLs by nebulization as needed.   lansoprazole  30 MG capsule Commonly known as: PREVACID  TAKE 1 CAPSULE BY MOUTH ONCE DAILY AT NOON   lidocaine  4 % Place 1 patch onto the skin daily. Apply to right ankle   montelukast  10 MG tablet Commonly known as: SINGULAIR  TAKE 1 TABLET BY MOUTH EVERYDAY AT BEDTIME   ondansetron  4 MG tablet Commonly known as: ZOFRAN  Take 4 mg by mouth as needed for nausea or vomiting.   potassium chloride  SA 20 MEQ tablet Commonly known as: KLOR-CON  M Take 40 mEq by mouth daily. Give 40 mEq by mouth one time a day for Hypokalemia   predniSONE  5 MG tablet Commonly known as: DELTASONE  Take 5 mg by mouth daily at 12  noon.   saccharomyces boulardii 250 MG capsule Commonly known as: FLORASTOR Take 250 mg by mouth 2 (two) times daily.   Torsemide 40 MG Tabs Take 40 mg by mouth daily at 12 noon.   traMADol  50 MG tablet Commonly known as: ULTRAM  Take 50 mg by mouth every 6 (six) hours as needed.        Review of Systems  Constitutional:  Negative for appetite change, fatigue and fever.  HENT:  Negative for congestion, sinus pressure and trouble swallowing.   Eyes:  Negative for visual disturbance.  Respiratory:  Positive for cough and shortness of breath. Negative for chest tightness and wheezing.        DOE, occasional yellow phlegm in am, hacking cough.   Cardiovascular:  Negative for leg swelling.  Gastrointestinal:  Negative for abdominal pain and constipation.  Genitourinary:  Negative for dysuria, frequency and urgency.       Incontinent of urine.   Musculoskeletal:  Positive for arthralgias, back pain, gait problem and myalgias.       Right lower back hip pain, travels down to the right leg. Left groin/hip region pain with movement, able to walk if Tramadol  use.   Skin:  Negative for color change.  Neurological:  Negative for speech difficulty, weakness and headaches.  Psychiatric/Behavioral:  Negative for confusion and sleep disturbance. The patient is not nervous/anxious.     Immunization History  Administered Date(s) Administered   Fluad Quad(high Dose 65+) 09/30/2020   INFLUENZA, HIGH DOSE SEASONAL PF 09/24/2015, 08/24/2016, 08/16/2018, 09/08/2019, 09/24/2020, 10/04/2021, 09/12/2023   Influenza,inj,Quad PF,6+ Mos 08/11/2016   Influenza-Unspecified 10/04/2012, 08/19/2013, 08/28/2014, 11/12/2017, 10/05/2022   Moderna Covid-19 Vaccine Bivalent Booster 63yrs & up 10/04/2021, 09/26/2023   Moderna Sars-Covid-2 Vaccination 12/15/2019, 01/12/2020, 10/19/2020   PNEUMOCOCCAL CONJUGATE-20 10/04/2021   PPD Test 03/01/2018   Pneumococcal Conjugate-13 09/13/2015, 06/26/2017   Pneumococcal  Polysaccharide-23 12/11/2009   Tdap 04/04/2023   Zoster Recombinant(Shingrix) 01/28/2019, 09/02/2019   Pertinent  Health Maintenance Due  Topic Date Due   Influenza Vaccine  09/09/2024 (Originally 07/11/2024)   DEXA SCAN  Completed      01/31/2023    1:08 PM 02/19/2023    2:29 PM 03/06/2023   11:57 AM 03/13/2023   10:48 AM 03/22/2023   10:53 AM  Fall Risk  Falls in the past year? 0 1 0 0 0  Was there an injury with Fall? 0 1 0 0 0  Fall Risk Category Calculator 0 3 0 0 0  Patient at Risk for Falls Due to Impaired balance/gait;Impaired mobility History of fall(s);Impaired balance/gait History of fall(s);Impaired balance/gait History of fall(s);Impaired balance/gait History of fall(s);Impaired balance/gait  Fall risk Follow up Falls evaluation completed Falls evaluation completed Falls evaluation completed Falls evaluation  completed Falls evaluation completed   Functional Status Survey:    Vitals:   08/19/24 1440  BP: 117/61  Pulse: 80  Resp: 20  Temp: (!) 96.8 F (36 C)  SpO2: 95%  Weight: 176 lb (79.8 kg)   Body mass index is 29.29 kg/m. Physical Exam Vitals and nursing note reviewed.  Constitutional:      Appearance: Normal appearance.  HENT:     Head: Normocephalic and atraumatic.     Nose: Nose normal.     Mouth/Throat:     Mouth: Mucous membranes are moist.  Eyes:     Extraocular Movements: Extraocular movements intact.     Conjunctiva/sclera: Conjunctivae normal.     Pupils: Pupils are equal, round, and reactive to light.  Cardiovascular:     Rate and Rhythm: Normal rate and regular rhythm.     Heart sounds: No murmur heard. Pulmonary:     Effort: Pulmonary effort is normal.     Breath sounds: Rales present. No wheezing or rhonchi.     Comments: Decreased air entry Bibasilar rales.  Abdominal:     General: Bowel sounds are normal.     Palpations: Abdomen is soft.     Tenderness: There is no abdominal tenderness.  Musculoskeletal:     Cervical back: Normal  range of motion and neck supple.     Right lower leg: No edema.     Left lower leg: No edema.  Skin:    General: Skin is warm and dry.     Comments: Lots of moles. Chronic erythema venous insufficiency skin changes BLE. Scabbed area right intergluteal cleft.   Neurological:     General: No focal deficit present.     Mental Status: She is alert and oriented to person, place, and time. Mental status is at baseline.     Gait: Gait abnormal.  Psychiatric:        Mood and Affect: Mood normal.        Behavior: Behavior normal.        Thought Content: Thought content normal.        Judgment: Judgment normal.     Labs reviewed: Recent Labs    12/21/23 1639 12/22/23 0338 08/04/24 1854 08/05/24 0303 08/06/24 0308 08/14/24 0000  NA  --    < > 131* 139 141 144  K  --    < > 4.0 4.3 3.6 2.7*  CL  --    < > 98 108 109 101  CO2  --    < > 23 21* 19* 33*  GLUCOSE  --    < > 137* 162* 178*  --   BUN  --    < > 18 16 21  22*  CREATININE 1.07*   < > 1.49* 0.92 0.98 0.9  CALCIUM   --    < > 8.7* 8.5* 8.5* 8.8  MG 1.6*   < > 2.1 1.8 2.1  --   PHOS 3.6  --   --  3.0  --   --    < > = values in this interval not displayed.   Recent Labs    12/26/23 0418 05/23/24 0000 08/04/24 1854 08/05/24 0303 08/14/24 0000  AST 52*   < > 25 25 15   ALT 57*   < > 17 15 15   ALKPHOS 61   < > 89 74 70  BILITOT 0.8  --  1.2 0.7  --   PROT 6.1*  --  7.2 6.3*  --  ALBUMIN  2.8*   < > 3.2* 2.5* 3.4*   < > = values in this interval not displayed.   Recent Labs    08/06/24 0308 08/07/24 0304 08/08/24 0812 08/14/24 0000  WBC 18.3* 19.2* 15.4* 9.7  NEUTROABS 16.9* 17.3* 11.2*  --   HGB 8.9* 9.5* 10.1* 10.8*  HCT 30.5* 30.8* 33.0* 34*  MCV 94.1 93.6 92.7  --   PLT 214 228 245 321   Lab Results  Component Value Date   TSH 1.777 08/04/2024   Lab Results  Component Value Date   HGBA1C 6.5 08/14/2024   Lab Results  Component Value Date   CHOL 116 08/14/2024   HDL 38 08/14/2024   LDLCALC 57  08/14/2024   TRIG 126 08/14/2024   CHOLHDL 2.6 04/06/2020    Significant Diagnostic Results in last 30 days:  CT Angio Chest Pulmonary Embolism (PE) W or WO Contrast Result Date: 08/05/2024 CLINICAL DATA:  Fever and shortness of breath for 1 day EXAM: CT ANGIOGRAPHY CHEST WITH CONTRAST TECHNIQUE: Multidetector CT imaging of the chest was performed using the standard protocol during bolus administration of intravenous contrast. Multiplanar CT image reconstructions and MIPs were obtained to evaluate the vascular anatomy. RADIATION DOSE REDUCTION: This exam was performed according to the departmental dose-optimization program which includes automated exposure control, adjustment of the mA and/or kV according to patient size and/or use of iterative reconstruction technique. CONTRAST:  60mL OMNIPAQUE  IOHEXOL  350 MG/ML SOLN COMPARISON:  Chest x-ray from the previous day. FINDINGS: Cardiovascular: Thoracic aorta shows atherosclerotic calcifications without aneurysmal dilatation. No cardiac enlargement is noted. The pulmonary artery shows a normal branching pattern bilaterally. No filling defect to suggest pulmonary embolism is noted. Mild coronary calcifications are noted. Mediastinum/Nodes: Thoracic inlet is within normal limits. No hilar or mediastinal adenopathy is noted. Scattered small mediastinal nodes are seen. The esophagus as visualized is within normal limits. Lungs/Pleura: Lungs are well aerated bilaterally. New bilateral lower lobe consolidation is noted likely representing acute pneumonia. Scattered multiple pulmonary nodules are identified bilaterally similar to that seen on prior exam. Index nodule on the right lies in the upper lobe and measures approximately 10 mm stable in appearance. This is best seen on image number 67 of series 6. Index nodule on the left lies adjacent to the mediastinum with some apparent cavitation. This measures up to 17 mm also stable from the prior exam. Upper Abdomen:  Visualized upper abdomen is unremarkable. Musculoskeletal: No chest wall abnormality. No acute or significant osseous findings. Review of the MIP images confirms the above findings. IMPRESSION: No evidence of pulmonary embolism. New bilateral lower lobe consolidation consistent with multifocal pneumonia. Multiple pulmonary nodules which appears stable from the prior exam. Although metastatic disease deserves consideration these have been present for several previous exams suggesting a more benign etiology. Electronically Signed   By: Oneil Devonshire M.D.   On: 08/05/2024 01:00   DG Chest Port 1 View Result Date: 08/04/2024 CLINICAL DATA:  Questionable sepsis - evaluate for abnormality EXAM: PORTABLE CHEST 1 VIEW COMPARISON:  Chest x-ray 12/21/2023 FINDINGS: The heart and mediastinal contours are unchanged. Atherosclerotic plaque. No focal consolidation. No pulmonary edema. No pleural effusion. No pneumothorax. No acute osseous abnormality.  Cervical spine surgical hardware. IMPRESSION: 1. No active disease. 2.  Aortic Atherosclerosis (ICD10-I70.0). Electronically Signed   By: Morgane  Naveau M.D.   On: 08/04/2024 19:55    Assessment/Plan: Chronic diastolic CHF (congestive heart failure) (HCC) Euvolemic, DOE, on and off wheezing, cough, occasional yellow phlegm in  am. 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  Diclofenac , MRI completed, Neurosurgery consulted, not a good surgical candidate   Prediabetes  prediabetic, steroid induced.  Diet controlled  COPD with acute exacerbation (HCC) Stable, chronic hacking cough,COPD/Asthma Epinephrine  prn, Allegra , Singulair , DuoNeb  Rheumatoid arthritis (HCC) taking steroids, Tylenol ,  Tramadol , TSH 1.777 08/04/24  GERD (gastroesophageal reflux disease) Stable, off acid reducer,   Hgb 10.1 08/08/24  Age-related osteoporosis without current pathological fracture  off Alendronate , on Vit D, t score -1.8 06/06/21,  11/06/2023 declined DEXA  HLD (hyperlipidemia)  takes Atorvastatin , LDL 77 06/05/23  B12 deficiency Hx of  Vit B12 po, Vit B12 235 06/20/21  Gout R ankle, stable, on allopurinol. Uric 6.5 02/05/24  Essential hypertension Blood pressure is controlled, on Amlodipine . Bun/creat 21/0.98 08/06/24    Family/ staff Communication: Plan of care reviewed with the patient and charge nurse  Labs/tests ordered: None

## 2024-08-19 NOTE — Assessment & Plan Note (Signed)
 off Alendronate , on Vit D, t score -1.8 06/06/21, 11/06/2023 declined DEXA

## 2024-08-19 NOTE — Assessment & Plan Note (Signed)
 R ankle, stable, on allopurinol. Uric 6.5 02/05/24

## 2024-08-19 NOTE — Assessment & Plan Note (Signed)
 Blood pressure is controlled,  on Amlodipine . Bun/creat 21/0.98 08/06/24

## 2024-08-19 NOTE — Assessment & Plan Note (Signed)
 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  Diclofenac , MRI completed, Neurosurgery consulted, not a good surgical candidate.

## 2024-08-19 NOTE — Assessment & Plan Note (Signed)
 taking steroids, Tylenol ,  Tramadol , TSH 1.777 08/04/24

## 2024-08-19 NOTE — Assessment & Plan Note (Signed)
 Hx of  Vit B12 po, Vit B12 235 06/20/21

## 2024-08-19 NOTE — Assessment & Plan Note (Signed)
 Euvolemic,  DOE, on and off wheezing, cough, occasional yellow phlegm in am. EF 60-65% 05/22/22, on Torsemide, Amlodipine 

## 2024-08-19 NOTE — Assessment & Plan Note (Signed)
 takes Atorvastatin, LDL 77 06/05/23

## 2024-08-19 NOTE — Progress Notes (Unsigned)
 Location:  [facility] Room Number: N040 B Place of Service:  SNF (31)  Provider: Jaiceon Collister X, NP   PCP: Tyre Beaver X, NP Patient Care Team: Lurine Imel X, [name]  Extended Emergency Contact Information Primary Emergency Contact: Debrew,Jacqueline  [place] Home Phone: [phone] Mobile Phone: [phone] Relation: Daughter  Code Status: DNR Goals of care:  Advanced Directive information    08/12/2024    9:50 AM  Advanced Directives  Does Patient Have a Medical Advance Directive? Yes  Type of Estate agent of [place];Living will;Out of facility DNR (pink MOST or yellow form)  Does patient want to make changes to medical advance directive? No - Patient declined  Copy of Healthcare Power of Attorney in Chart? Yes - validated most recent copy scanned in chart (See row information)     Allergies  Allergen Reactions   Adhesive [Tape] Rash   Celebrex [Celecoxib] Hives   Ciprofibrate Nausea Only   Cymbalta [Duloxetine Hcl] Swelling   Gabitril [Tiagabine] Swelling   Lyrica [Pregabalin] Swelling   Neurontin [Gabapentin] Swelling   Nexium  [Esomeprazole ] Rash   Nsaids Rash   Shrimp [Shellfish Allergy] Anaphylaxis    Per patient shrimp only   Azactam  [Aztreonam ]     Hand swelling    Azelastine  Hcl     Rash    Ciprofloxacin  Other (See Comments)    dizziness   Claritin  [Loratadine ]     Irritability Nervousness    Methotrexate  Derivatives    Nasacort  [Triamcinolone ]     Dizzy    Olopatadine  Other (See Comments)    Pain and lethargy    Other    Sulfamethizole Other (See Comments)    unknown   Zantac [Ranitidine Hcl] Other (See Comments)    unknown   Claritin -D 12 Hour [Loratadine -Pseudoephedrine Er] Anxiety   Keflex  [Cephalexin ] Nausea And Vomiting    Tolerated Ancef    Penicillins Rash    Injection site reaction. Tolerated cefepime  in past. Also reports tolerating a  penicillin infusion after this initial rxn ~20 yrs ago.  Has patient had a PCN reaction causing immediate rash, facial/tongue/throat swelling, SOB or lightheadedness with hypotension: No Has patient had a PCN reaction causing severe rash involving mucus membranes or skin necrosis: No Has patient had a PCN reaction that required hospitalization No Has patient had a PCN reaction occurring within the last 10 years: No     Sulfa Antibiotics Nausea And Vomiting    Chief Complaint  Patient presents with   Discharge Note    Discharge to AL    HPI:  89 y.o. female      Past Medical History:  Diagnosis Date   Adrenal failure (HCC)    Arthritis    Asthma    Cancer (HCC)    Cataract    Closed nondisplaced fracture of fifth right metatarsal bone 09/18/2017   COPD (chronic obstructive pulmonary disease) (HCC)    Diverticulitis    Per patient   E coli bacteremia    Fibromyalgia 2008   GERD (gastroesophageal reflux disease) 12/18/2016   HCAP (healthcare-associated pneumonia) 02/03/2018   Hyperlipidemia 10/13/2021   Osteoporosis    RA (rheumatoid arthritis) (HCC)    Recurrent upper respiratory infection (URI)    Sepsis due to urinary tract infection (HCC) 01/18/2018   Urticaria     Past Surgical History:  Procedure Laterality Date   BACK SURGERY     BILATERAL CARPAL TUNNEL RELEASE  2005  right and left   CATARACT EXTRACTION, BILATERAL  2004   right and left   CERVICAL FUSION  2011,2010,2008   2 disks   HEEL SPUR SURGERY  2004   lower back fusion  2011   Fusion of 3-4 and 4-5 lower back   RADIOFREQUENCY ABLATION  2020   ROTATOR CUFF REPAIR  [id number]   SQUAMOUS CELL CARCINOMA EXCISION     TONSILLECTOMY AND ADENOIDECTOMY  1947   TOTAL SHOULDER ARTHROPLASTY        reports that she quit smoking about 57 years ago. Her smoking use included cigarettes. She started smoking about 67 years ago. She has a 7.5 pack-year smoking history. She has never been exposed to tobacco  smoke. She has never used smokeless tobacco. She reports that she does not drink alcohol and does not use drugs. Social History   Socioeconomic History   Marital status: Widowed    Spouse name: Not on file   Number of children: 2   Years of education: Not on file   Highest education level: Not on file  Occupational History   Occupation: Retired Child psychotherapist  Tobacco Use   Smoking status: Former    Current packs/day: 0.00    Average packs/day: 0.8 packs/day for 10.0 years (7.5 ttl pk-yrs)    Types: Cigarettes    Start date: 89    Quit date: 1968    Years since quitting: 57.7    Passive exposure: Never   Smokeless tobacco: Never  Vaping Use   Vaping status: Never Used  Substance and Sexual Activity   Alcohol use: No   Drug use: No   Sexual activity: Not Currently  Other Topics Concern   Not on file  Social History Narrative      Diet:        Do you drink/ eat things with caffeine? Dr. Nunzio 2/ day      Marital status: Widowed                              What year were you married ? 1953      Do you live in a house, apartment,assistred living, condo, trailer, etc.)? Apartment      Is it one or more stories?       How many persons live in your home ? 1      Do you have any pets in your home ?(please list) No      Highest Level of education completed: PhD       Current or past profession: Paramedic, Child psychotherapist, Special Educator       Do you exercise?   No                           Type & how often       ADVANCED DIRECTIVES (Please bring copies)      Do you have a living will? Yes      Do you have a DNR form?                       If not, do you want to discuss one? Yes      Do you have signed POA?HPOA forms?                 If so, please bring to your appointment Tes      FUNCTIONAL  STATUS- To be completed by Spouse / child / Staff       Do you have difficulty bathing or dressing yourself ? No      Do you have difficulty preparing food or eating ? No       Do you have difficulty managing your mediation ? No      Do you have difficulty managing your finances ? No      Do you have difficulty affording your medication ? No      Social Drivers of Corporate investment banker Strain: Not on file  Food Insecurity: No Food Insecurity (12/21/2023)   Hunger Vital Sign    Worried About Running Out of Food in the Last Year: Never true    Ran Out of Food in the Last Year: Never true  Transportation Needs: No Transportation Needs (12/21/2023)   PRAPARE - Administrator, Civil Service (Medical): No    Lack of Transportation (Non-Medical): No  Physical Activity: Not on file  Stress: Not on file  Social Connections: Not on file  Intimate Partner Violence: Not At Risk (12/21/2023)   Humiliation, Afraid, Rape, and Kick questionnaire    Fear of Current or Ex-Partner: No    Emotionally Abused: No    Physically Abused: No    Sexually Abused: No   Functional Status Survey:    Allergies  Allergen Reactions   Adhesive [Tape] Rash   Celebrex [Celecoxib] Hives   Ciprofibrate Nausea Only   Cymbalta [Duloxetine Hcl] Swelling   Gabitril [Tiagabine] Swelling   Lyrica [Pregabalin] Swelling   Neurontin [Gabapentin] Swelling   Nexium  [Esomeprazole ] Rash   Nsaids Rash   Shrimp [Shellfish Allergy] Anaphylaxis    Per patient shrimp only   Azactam  [Aztreonam ]     Hand swelling    Azelastine  Hcl     Rash    Ciprofloxacin  Other (See Comments)    dizziness   Claritin  [Loratadine ]     Irritability Nervousness    Methotrexate  Derivatives    Nasacort  [Triamcinolone ]     Dizzy    Olopatadine  Other (See Comments)    Pain and lethargy    Other    Sulfamethizole Other (See Comments)    unknown   Zantac [Ranitidine Hcl] Other (See Comments)    unknown   Claritin -D 12 Hour [Loratadine -Pseudoephedrine Er] Anxiety   Keflex  [Cephalexin ] Nausea And Vomiting    Tolerated Ancef    Penicillins Rash    Injection site reaction. Tolerated  cefepime  in past. Also reports tolerating a penicillin infusion after this initial rxn ~20 yrs ago.  Has patient had a PCN reaction causing immediate rash, facial/tongue/throat swelling, SOB or lightheadedness with hypotension: No Has patient had a PCN reaction causing severe rash involving mucus membranes or skin necrosis: No Has patient had a PCN reaction that required hospitalization No Has patient had a PCN reaction occurring within the last 10 years: No     Sulfa Antibiotics Nausea And Vomiting    Pertinent  Health Maintenance Due  Topic Date Due   Influenza Vaccine  09/09/2024 (Originally 07/11/2024)   DEXA SCAN  Completed    Medications: Outpatient Encounter Medications as of 08/19/2024  Medication Sig   acetaminophen  (TYLENOL ) 325 MG tablet Take 650 mg by mouth 3 (three) times daily.   allopurinol (ZYLOPRIM) 100 MG tablet Take 100 mg by mouth daily at 12 noon.   amLODipine  (NORVASC ) 5 MG tablet Take 5 mg by mouth daily at 12 noon.   atorvastatin  (LIPITOR)  20 MG tablet TAKE 1 TABLET BY MOUTH EVERY DAY   Biotin  5 MG TBDP Take 1 tablet (5 mg total) by mouth daily.   cholecalciferol  (VITAMIN D3) 25 MCG (1000 UNIT) tablet Take 1,000 Units by mouth daily at 12 noon.   cycloSPORINE  (RESTASIS ) 0.05 % ophthalmic emulsion Place 1 drop into both eyes 2 (two) times daily.   D-Mannose 500 MG CAPS Take 500 mg by mouth daily.   diclofenac  Sodium (VOLTAREN ) 1 % GEL APPLY 2 TO 4 GRAMS TOPICALLY TO AFFECTED JOINTS UP TO 4 TIMES DAILY   docusate sodium  (COLACE) 100 MG capsule Take 1 capsule (100 mg total) by mouth 2 (two) times daily.   fexofenadine  (ALLEGRA ) 60 MG tablet Take 60 mg by mouth daily.   ipratropium-albuterol  (DUONEB) 0.5-2.5 (3) MG/3ML SOLN Take 3 mLs by nebulization as needed.   lansoprazole  (PREVACID ) 30 MG capsule TAKE 1 CAPSULE BY MOUTH ONCE DAILY AT NOON   lidocaine  4 % Place 1 patch onto the skin daily. Apply to right ankle   montelukast  (SINGULAIR ) 10 MG tablet TAKE 1 TABLET BY  MOUTH EVERYDAY AT BEDTIME   ondansetron  (ZOFRAN ) 4 MG tablet Take 4 mg by mouth as needed for nausea or vomiting.   potassium chloride  SA (KLOR-CON  M) 20 MEQ tablet Take 40 mEq by mouth daily. Give 40 mEq by mouth one time a day for Hypokalemia   predniSONE  (DELTASONE ) 5 MG tablet Take 5 mg by mouth daily at 12 noon.   saccharomyces boulardii (FLORASTOR) 250 MG capsule Take 250 mg by mouth 2 (two) times daily.   Torsemide 40 MG TABS Take 40 mg by mouth daily at 12 noon.   traMADol  (ULTRAM ) 50 MG tablet Take 50 mg by mouth every 6 (six) hours as needed.   No facility-administered encounter medications on file as of 08/19/2024.    Review of Systems  Vitals:   08/19/24 1629  BP: 117/61  Pulse: 80  Resp: 20  Temp: (!) 96.8 F (36 C)  SpO2: 95%  Weight: 176 lb 14.4 oz (80.2 kg)  Height: 5' 5 (1.651 m)   Body mass index is 29.44 kg/m. Physical Exam  Labs reviewed: Basic Metabolic Panel: Recent Labs    12/21/23 1639 12/22/23 0338 08/04/24 1854 08/05/24 0303 08/06/24 0308 08/14/24 0000  NA  --    < > 131* 139 141 144  K  --    < > 4.0 4.3 3.6 2.7*  CL  --    < > 98 108 109 101  CO2  --    < > 23 21* 19* 33*  GLUCOSE  --    < > 137* 162* 178*  --   BUN  --    < > 18 16 21  22*  CREATININE 1.07*   < > 1.49* 0.92 0.98 0.9  CALCIUM   --    < > 8.7* 8.5* 8.5* 8.8  MG 1.6*   < > 2.1 1.8 2.1  --   PHOS 3.6  --   --  3.0  --   --    < > = values in this interval not displayed.   Liver Function Tests: Recent Labs    12/26/23 0418 05/23/24 0000 08/04/24 1854 08/05/24 0303 08/14/24 0000  AST 52*   < > 25 25 15   ALT 57*   < > 17 15 15   ALKPHOS 61   < > 89 74 70  BILITOT 0.8  --  1.2 0.7  --   PROT 6.1*  --  7.2 6.3*  --   ALBUMIN  2.8*   < > 3.2* 2.5* 3.4*   < > = values in this interval not displayed.   Recent Labs    12/21/23 1639  LIPASE 20   Recent Labs    12/21/23 1130  AMMONIA 23   CBC: Recent Labs    08/06/24 0308 08/07/24 0304 08/08/24 0812  08/14/24 0000  WBC 18.3* 19.2* 15.4* 9.7  NEUTROABS 16.9* 17.3* 11.2*  --   HGB 8.9* 9.5* 10.1* 10.8*  HCT 30.5* 30.8* 33.0* 34*  MCV 94.1 93.6 92.7  --   PLT 214 228 245 321   Cardiac Enzymes: Recent Labs    08/04/24 1854  CKTOTAL 69   BNP: Invalid input(s): POCBNP CBG: No results for input(s): GLUCAP in the last 8760 hours.  Procedures and Imaging Studies During Stay: CT Angio Chest Pulmonary Embolism (PE) W or WO Contrast Result Date: 08/05/2024 CLINICAL DATA:  Fever and shortness of breath for 1 day EXAM: CT ANGIOGRAPHY CHEST WITH CONTRAST TECHNIQUE: Multidetector CT imaging of the chest was performed using the standard protocol during bolus administration of intravenous contrast. Multiplanar CT image reconstructions and MIPs were obtained to evaluate the vascular anatomy. RADIATION DOSE REDUCTION: This exam was performed according to the departmental dose-optimization program which includes automated exposure control, adjustment of the mA and/or kV according to patient size and/or use of iterative reconstruction technique. CONTRAST:  60mL OMNIPAQUE  IOHEXOL  350 MG/ML SOLN COMPARISON:  Chest x-ray from the previous day. FINDINGS: Cardiovascular: Thoracic aorta shows atherosclerotic calcifications without aneurysmal dilatation. No cardiac enlargement is noted. The pulmonary artery shows a normal branching pattern bilaterally. No filling defect to suggest pulmonary embolism is noted. Mild coronary calcifications are noted. Mediastinum/Nodes: Thoracic inlet is within normal limits. No hilar or mediastinal adenopathy is noted. Scattered small mediastinal nodes are seen. The esophagus as visualized is within normal limits. Lungs/Pleura: Lungs are well aerated bilaterally. New bilateral lower lobe consolidation is noted likely representing acute pneumonia. Scattered multiple pulmonary nodules are identified bilaterally similar to that seen on prior exam. Index nodule on the right lies in the  upper lobe and measures approximately 10 mm stable in appearance. This is best seen on image number 67 of series 6. Index nodule on the left lies adjacent to the mediastinum with some apparent cavitation. This measures up to 17 mm also stable from the prior exam. Upper Abdomen: Visualized upper abdomen is unremarkable. Musculoskeletal: No chest wall abnormality. No acute or significant osseous findings. Review of the MIP images confirms the above findings. IMPRESSION: No evidence of pulmonary embolism. New bilateral lower lobe consolidation consistent with multifocal pneumonia. Multiple pulmonary nodules which appears stable from the prior exam. Although metastatic disease deserves consideration these have been present for several previous exams suggesting a more benign etiology. Electronically Signed   By: Oneil Devonshire M.D.   On: 08/05/2024 01:00   DG Chest Port 1 View Result Date: 08/04/2024 CLINICAL DATA:  Questionable sepsis - evaluate for abnormality EXAM: PORTABLE CHEST 1 VIEW COMPARISON:  Chest x-ray 12/21/2023 FINDINGS: The heart and mediastinal contours are unchanged. Atherosclerotic plaque. No focal consolidation. No pulmonary edema. No pleural effusion. No pneumothorax. No acute osseous abnormality.  Cervical spine surgical hardware. IMPRESSION: 1. No active disease. 2.  Aortic Atherosclerosis (ICD10-I70.0). Electronically Signed   By: Morgane  Naveau M.D.   On: 08/04/2024 19:55    Assessment/Plan:   There are no diagnoses linked to this encounter.   Patient is being discharged with the following  home health services:    Patient is being discharged with the following durable medical equipment:    Patient has been advised to f/u with their PCP in 1-2 weeks to for a transitions of care visit.  Social services at their facility was responsible for arranging this appointment.  Pt was provided with adequate prescriptions of noncontrolled medications to reach the scheduled appointment .  For  controlled substances, a limited supply was provided as appropriate for the individual patient.  If the pt normally receives these medications from a pain clinic or has a contract with another physician, these medications should be received from that clinic or physician only).    Future labs/tests needed:  ***

## 2024-08-19 NOTE — Assessment & Plan Note (Signed)
 Stable, chronic hacking cough,COPD/Asthma Epinephrine  prn, Allegra , Singulair , DuoNeb

## 2024-08-21 ENCOUNTER — Encounter: Payer: Self-pay | Admitting: Nurse Practitioner

## 2024-09-08 ENCOUNTER — Encounter: Payer: Self-pay | Admitting: Sports Medicine

## 2024-09-08 ENCOUNTER — Non-Acute Institutional Stay (SKILLED_NURSING_FACILITY): Payer: Self-pay | Admitting: Sports Medicine

## 2024-09-08 DIAGNOSIS — M069 Rheumatoid arthritis, unspecified: Secondary | ICD-10-CM | POA: Diagnosis not present

## 2024-09-08 DIAGNOSIS — R300 Dysuria: Secondary | ICD-10-CM | POA: Diagnosis not present

## 2024-09-08 DIAGNOSIS — M48062 Spinal stenosis, lumbar region with neurogenic claudication: Secondary | ICD-10-CM | POA: Diagnosis not present

## 2024-09-08 DIAGNOSIS — N390 Urinary tract infection, site not specified: Secondary | ICD-10-CM | POA: Diagnosis not present

## 2024-09-08 DIAGNOSIS — I5032 Chronic diastolic (congestive) heart failure: Secondary | ICD-10-CM | POA: Diagnosis not present

## 2024-09-08 NOTE — Progress Notes (Signed)
 Provider:  Dr. Jackalyn Blazing Location:  [facility] Place of Service:   Assisted living   PCP: Mast, [name], NP [name], [name], [name]  Extended Emergency Contact Information Primary Emergency Contact: Debrew,Jacqueline  [place] Home Phone: [phone] Mobile Phone: [phone] Relation: Daughter  Goals of Care: Advanced Directive information    08/12/2024    9:50 AM  Advanced Directives  Does Patient Have a Medical Advance Directive? Yes  Type of Estate agent of National;Living will;Out of facility DNR (pink MOST or yellow form)  Does patient want to make changes to medical advance directive? No - Patient declined  Copy of Healthcare Power of Attorney in Chart? Yes - validated most recent copy scanned in chart (See row information)       History of Present Illness  89 year old female with past medical history of COPD, CHF, chronic lower back pain, rheumatoid arthritis, fibromyalgia, GERD is evaluated for an acute visit for dysuria Patient seen and examined in her room. She is lying on her bed.  She opens her eyes on calling her name. She seems pleasant and comfortable and does not appear to be in distress. Patient complains of dysuria with urinary frequency since last night.  Denies fevers, chills, lower abdominal pain, nausea, vomiting, hematuria Patient denies cough, shortness of breath, chest pain.  Patient denies being sad or anxious.       Past Medical History:  Diagnosis Date   Adrenal failure    Arthritis    Asthma    Cancer (HCC)    Cataract    Closed nondisplaced fracture of fifth right metatarsal bone 09/18/2017   COPD (chronic obstructive pulmonary disease) (HCC)    Diverticulitis    Per patient   E coli bacteremia    Fibromyalgia 2008   GERD (gastroesophageal reflux disease) 12/18/2016   HCAP (healthcare-associated pneumonia) 02/03/2018    Hyperlipidemia 10/13/2021   Osteoporosis    RA (rheumatoid arthritis) (HCC)    Recurrent upper respiratory infection (URI)    Sepsis due to urinary tract infection (HCC) 01/18/2018   Urticaria    Past Surgical History:  Procedure Laterality Date   BACK SURGERY     BILATERAL CARPAL TUNNEL RELEASE  2005   right and left   CATARACT EXTRACTION, BILATERAL  2004   right and left   CERVICAL FUSION  2011,2010,2008   2 disks   HEEL SPUR SURGERY  2004   lower back fusion  2011   Fusion of 3-4 and 4-5 lower back   RADIOFREQUENCY ABLATION  2020   ROTATOR CUFF REPAIR  [id number]   SQUAMOUS CELL CARCINOMA EXCISION     TONSILLECTOMY AND ADENOIDECTOMY  1947   TOTAL SHOULDER ARTHROPLASTY      reports that she quit smoking about 57 years ago. Her smoking use included cigarettes. She started smoking about 67 years ago. She has a 7.5 pack-year smoking history. She has never been exposed to tobacco smoke. She has never used smokeless tobacco. She reports that she does not drink alcohol and does not use drugs. Social History   Socioeconomic History   Marital status: Widowed    Spouse name: Not on file   Number of children: 2   Years of education: Not on file   Highest education level: Not on file  Occupational History   Occupation: Retired Child psychotherapist  Tobacco Use   Smoking status: Former    Current packs/day: 0.00  Average packs/day: 0.8 packs/day for 10.0 years (7.5 ttl pk-yrs)    Types: Cigarettes    Start date: 78    Quit date: 1968    Years since quitting: 57.7    Passive exposure: Never   Smokeless tobacco: Never  Vaping Use   Vaping status: Never Used  Substance and Sexual Activity   Alcohol use: No   Drug use: No   Sexual activity: Not Currently  Other Topics Concern   Not on file  Social History Narrative      Diet:        Do you drink/ eat things with caffeine? Dr. Nunzio 2/ day      Marital status: Widowed                              What year were you married  ? 1953      Do you live in a house, apartment,assistred living, condo, trailer, etc.)? Apartment      Is it one or more stories?       How many persons live in your home ? 1      Do you have any pets in your home ?(please list) No      Highest Level of education completed: PhD       Current or past profession: Paramedic, Child psychotherapist, Special Educator       Do you exercise?   No                           Type & how often       ADVANCED DIRECTIVES (Please bring copies)      Do you have a living will? Yes      Do you have a DNR form?                       If not, do you want to discuss one? Yes      Do you have signed POA?HPOA forms?                 If so, please bring to your appointment Tes      FUNCTIONAL STATUS- To be completed by Spouse / child / Staff       Do you have difficulty bathing or dressing yourself ? No      Do you have difficulty preparing food or eating ? No      Do you have difficulty managing your mediation ? No      Do you have difficulty managing your finances ? No      Do you have difficulty affording your medication ? No      Social Drivers of Corporate investment banker Strain: Not on file  Food Insecurity: No Food Insecurity (12/21/2023)   Hunger Vital Sign    Worried About Running Out of Food in the Last Year: Never true    Ran Out of Food in the Last Year: Never true  Transportation Needs: No Transportation Needs (12/21/2023)   PRAPARE - Administrator, Civil Service (Medical): No    Lack of Transportation (Non-Medical): No  Physical Activity: Not on file  Stress: Not on file  Social Connections: Not on file  Intimate Partner Violence: Not At Risk (12/21/2023)   Humiliation, Afraid, Rape, and Kick questionnaire    Fear of Current or Ex-Partner: No  Emotionally Abused: No    Physically Abused: No    Sexually Abused: No    Functional Status Survey:    Family History  Problem Relation Age of Onset   Heart attack  Maternal Grandmother    Heart attack Paternal Grandfather    Breast cancer Mother 8   Diabetes Father    Heart disease Father    Congestive Heart Failure Father 74       Died from   Allergic rhinitis Neg Hx    Asthma Neg Hx    Eczema Neg Hx    Urticaria Neg Hx     Health Maintenance  Topic Date Due   Influenza Vaccine  09/09/2024 (Originally 07/11/2024)   COVID-19 Vaccine (7 - 2025-26 season) 09/10/2024 (Originally 08/11/2024)   DTaP/Tdap/Td (2 - Td or Tdap) 04/03/2033   Pneumococcal Vaccine: 50+ Years  Completed   DEXA SCAN  Completed   Zoster Vaccines- Shingrix  Completed   HPV VACCINES  Aged Out   Meningococcal B Vaccine  Aged Out    Allergies  Allergen Reactions   Adhesive [Tape] Rash   Celebrex [Celecoxib] Hives   Ciprofibrate Nausea Only   Cymbalta [Duloxetine Hcl] Swelling   Gabitril [Tiagabine] Swelling   Lyrica [Pregabalin] Swelling   Neurontin [Gabapentin] Swelling   Nexium  [Esomeprazole ] Rash   Nsaids Rash   Shrimp [Shellfish Allergy] Anaphylaxis    Per patient shrimp only   Azactam  [Aztreonam ]     Hand swelling    Azelastine  Hcl     Rash    Ciprofloxacin  Other (See Comments)    dizziness   Claritin  [Loratadine ]     Irritability Nervousness    Methotrexate  Derivatives    Nasacort  [Triamcinolone ]     Dizzy    Olopatadine  Other (See Comments)    Pain and lethargy    Other    Sulfamethizole Other (See Comments)    unknown   Zantac [Ranitidine Hcl] Other (See Comments)    unknown   Claritin -D 12 Hour [Loratadine -Pseudoephedrine Er] Anxiety   Keflex  [Cephalexin ] Nausea And Vomiting    Tolerated Ancef    Penicillins Rash    Injection site reaction. Tolerated cefepime  in past. Also reports tolerating a penicillin infusion after this initial rxn ~20 yrs ago.  Has patient had a PCN reaction causing immediate rash, facial/tongue/throat swelling, SOB or lightheadedness with hypotension: No Has patient had a PCN reaction causing severe rash involving mucus  membranes or skin necrosis: No Has patient had a PCN reaction that required hospitalization No Has patient had a PCN reaction occurring within the last 10 years: No     Sulfa Antibiotics Nausea And Vomiting    Outpatient Encounter Medications as of 09/08/2024  Medication Sig   acetaminophen  (TYLENOL ) 325 MG tablet Take 650 mg by mouth 3 (three) times daily.   allopurinol (ZYLOPRIM) 100 MG tablet Take 100 mg by mouth daily at 12 noon.   amLODipine  (NORVASC ) 5 MG tablet Take 5 mg by mouth daily at 12 noon.   atorvastatin  (LIPITOR) 20 MG tablet TAKE 1 TABLET BY MOUTH EVERY DAY   Biotin  5 MG TBDP Take 1 tablet (5 mg total) by mouth daily.   cholecalciferol  (VITAMIN D3) 25 MCG (1000 UNIT) tablet Take 1,000 Units by mouth daily at 12 noon.   cycloSPORINE  (RESTASIS ) 0.05 % ophthalmic emulsion Place 1 drop into both eyes 2 (two) times daily.   D-Mannose 500 MG CAPS Take 500 mg by mouth daily.   diclofenac  Sodium (VOLTAREN ) 1 % GEL APPLY 2  TO 4 GRAMS TOPICALLY TO AFFECTED JOINTS UP TO 4 TIMES DAILY   docusate sodium  (COLACE) 100 MG capsule Take 1 capsule (100 mg total) by mouth 2 (two) times daily.   fexofenadine  (ALLEGRA ) 60 MG tablet Take 60 mg by mouth daily.   ipratropium-albuterol  (DUONEB) 0.5-2.5 (3) MG/3ML SOLN Take 3 mLs by nebulization as needed.   lansoprazole  (PREVACID ) 30 MG capsule TAKE 1 CAPSULE BY MOUTH ONCE DAILY AT NOON   lidocaine  4 % Place 1 patch onto the skin daily. Apply to right ankle   montelukast  (SINGULAIR ) 10 MG tablet TAKE 1 TABLET BY MOUTH EVERYDAY AT BEDTIME   ondansetron  (ZOFRAN ) 4 MG tablet Take 4 mg by mouth as needed for nausea or vomiting.   potassium chloride  SA (KLOR-CON  M) 20 MEQ tablet Take 40 mEq by mouth daily. Give 40 mEq by mouth one time a day for Hypokalemia   predniSONE  (DELTASONE ) 5 MG tablet Take 5 mg by mouth daily at 12 noon.   saccharomyces boulardii (FLORASTOR) 250 MG capsule Take 250 mg by mouth 2 (two) times daily.   Torsemide 40 MG TABS Take  40 mg by mouth daily at 12 noon.   traMADol  (ULTRAM ) 50 MG tablet Take 50 mg by mouth every 6 (six) hours as needed.   No facility-administered encounter medications on file as of 09/08/2024.    Review of Systems  Constitutional:  Negative for chills and fever.  Respiratory:  Negative for cough, shortness of breath and wheezing.   Cardiovascular:  Negative for chest pain, palpitations and leg swelling.  Gastrointestinal:  Negative for abdominal distention, abdominal pain, blood in stool, constipation, diarrhea, nausea and vomiting.  Genitourinary:  Positive for dysuria and frequency.  Neurological:  Negative for dizziness.  Psychiatric/Behavioral:  Negative for confusion.    Negative unless indicated in HPI.  There were no vitals filed for this visit. There is no height or weight on file to calculate BMI. BP Readings from Last 3 Encounters:  08/19/24 117/61  08/19/24 117/61  08/14/24 (!) 158/72   Wt Readings from Last 3 Encounters:  08/19/24 176 lb 14.4 oz (80.2 kg)  08/19/24 176 lb (79.8 kg)  08/14/24 176 lb 14.4 oz (80.2 kg)   Physical Exam Constitutional:      Appearance: Normal appearance.  HENT:     Head: Normocephalic and atraumatic.  Cardiovascular:     Rate and Rhythm: Normal rate and regular rhythm.  Pulmonary:     Effort: Pulmonary effort is normal. No respiratory distress.     Breath sounds: Normal breath sounds. No wheezing.  Abdominal:     General: Bowel sounds are normal. There is no distension.     Tenderness: There is no abdominal tenderness. There is no guarding or rebound.     Comments:    Musculoskeletal:        General: No swelling.  Neurological:     Mental Status: She is alert. Mental status is at baseline.     Motor: No weakness.     Labs reviewed: Basic Metabolic Panel: Recent Labs    12/21/23 1639 12/22/23 0338 08/04/24 1854 08/05/24 0303 08/06/24 0308 08/14/24 0000  NA  --    < > 131* 139 141 144  K  --    < > 4.0 4.3 3.6 2.7*   CL  --    < > 98 108 109 101  CO2  --    < > 23 21* 19* 33*  GLUCOSE  --    < >  137* 162* 178*  --   BUN  --    < > 18 16 21  22*  CREATININE 1.07*   < > 1.49* 0.92 0.98 0.9  CALCIUM   --    < > 8.7* 8.5* 8.5* 8.8  MG 1.6*   < > 2.1 1.8 2.1  --   PHOS 3.6  --   --  3.0  --   --    < > = values in this interval not displayed.   Liver Function Tests: Recent Labs    12/26/23 0418 05/23/24 0000 08/04/24 1854 08/05/24 0303 08/14/24 0000  AST 52*   < > 25 25 15   ALT 57*   < > 17 15 15   ALKPHOS 61   < > 89 74 70  BILITOT 0.8  --  1.2 0.7  --   PROT 6.1*  --  7.2 6.3*  --   ALBUMIN  2.8*   < > 3.2* 2.5* 3.4*   < > = values in this interval not displayed.   Recent Labs    12/21/23 1639  LIPASE 20   Recent Labs    12/21/23 1130  AMMONIA 23   CBC: Recent Labs    08/06/24 0308 08/07/24 0304 08/08/24 0812 08/14/24 0000  WBC 18.3* 19.2* 15.4* 9.7  NEUTROABS 16.9* 17.3* 11.2*  --   HGB 8.9* 9.5* 10.1* 10.8*  HCT 30.5* 30.8* 33.0* 34*  MCV 94.1 93.6 92.7  --   PLT 214 228 245 321   Cardiac Enzymes: Recent Labs    08/04/24 1854  CKTOTAL 69   BNP: Invalid input(s): POCBNP Lab Results  Component Value Date   HGBA1C 6.5 08/14/2024   Lab Results  Component Value Date   TSH 1.777 08/04/2024   Lab Results  Component Value Date   VITAMINB12 1,377 (H) 05/26/2022   Lab Results  Component Value Date   FOLATE 26.8 02/06/2018   Lab Results  Component Value Date   IRON 89 02/06/2018   TIBC 281 02/06/2018   FERRITIN 74 02/06/2018    Imaging and Procedures obtained prior to SNF admission: CT Angio Chest Pulmonary Embolism (PE) W or WO Contrast Result Date: 08/05/2024 CLINICAL DATA:  Fever and shortness of breath for 1 day EXAM: CT ANGIOGRAPHY CHEST WITH CONTRAST TECHNIQUE: Multidetector CT imaging of the chest was performed using the standard protocol during bolus administration of intravenous contrast. Multiplanar CT image reconstructions and MIPs were obtained to  evaluate the vascular anatomy. RADIATION DOSE REDUCTION: This exam was performed according to the departmental dose-optimization program which includes automated exposure control, adjustment of the mA and/or kV according to patient size and/or use of iterative reconstruction technique. CONTRAST:  60mL OMNIPAQUE  IOHEXOL  350 MG/ML SOLN COMPARISON:  Chest x-ray from the previous day. FINDINGS: Cardiovascular: Thoracic aorta shows atherosclerotic calcifications without aneurysmal dilatation. No cardiac enlargement is noted. The pulmonary artery shows a normal branching pattern bilaterally. No filling defect to suggest pulmonary embolism is noted. Mild coronary calcifications are noted. Mediastinum/Nodes: Thoracic inlet is within normal limits. No hilar or mediastinal adenopathy is noted. Scattered small mediastinal nodes are seen. The esophagus as visualized is within normal limits. Lungs/Pleura: Lungs are well aerated bilaterally. New bilateral lower lobe consolidation is noted likely representing acute pneumonia. Scattered multiple pulmonary nodules are identified bilaterally similar to that seen on prior exam. Index nodule on the right lies in the upper lobe and measures approximately 10 mm stable in appearance. This is best seen on image number 67 of series 6.  Index nodule on the left lies adjacent to the mediastinum with some apparent cavitation. This measures up to 17 mm also stable from the prior exam. Upper Abdomen: Visualized upper abdomen is unremarkable. Musculoskeletal: No chest wall abnormality. No acute or significant osseous findings. Review of the MIP images confirms the above findings. IMPRESSION: No evidence of pulmonary embolism. New bilateral lower lobe consolidation consistent with multifocal pneumonia. Multiple pulmonary nodules which appears stable from the prior exam. Although metastatic disease deserves consideration these have been present for several previous exams suggesting a more benign  etiology. Electronically Signed   By: Oneil Devonshire M.D.   On: 08/05/2024 01:00   DG Chest Port 1 View Result Date: 08/04/2024 CLINICAL DATA:  Questionable sepsis - evaluate for abnormality EXAM: PORTABLE CHEST 1 VIEW COMPARISON:  Chest x-ray 12/21/2023 FINDINGS: The heart and mediastinal contours are unchanged. Atherosclerotic plaque. No focal consolidation. No pulmonary edema. No pleural effusion. No pneumothorax. No acute osseous abnormality.  Cervical spine surgical hardware. IMPRESSION: 1. No active disease. 2.  Aortic Atherosclerosis (ICD10-I70.0). Electronically Signed   By: Morgane  Naveau M.D.   On: 08/04/2024 19:55    Assessment and Plan Assessment & Plan   1. Dysuria (Primary) Patient complains of dysuria, urinary frequency Denies fevers, chills, abdominal pain, nausea, vomiting Will check UA, urine culture   2. Chronic diastolic CHF (congestive heart failure) (HCC) Euvolemic on exam Lungs clear to auscultation Continue with the torsemide Avoid salty foods  3. Rheumatoid arthritis involving multiple sites, unspecified whether rheumatoid factor present (HCC)  chronic Continue with prednisone , tramadol .   Family/ staff Communication:   Labs/tests ordered:  Jackalyn Blazing

## 2024-09-23 DIAGNOSIS — M48062 Spinal stenosis, lumbar region with neurogenic claudication: Secondary | ICD-10-CM | POA: Diagnosis not present

## 2024-10-09 ENCOUNTER — Encounter: Payer: Self-pay | Admitting: Sports Medicine

## 2024-10-09 ENCOUNTER — Non-Acute Institutional Stay: Payer: Self-pay | Admitting: Sports Medicine

## 2024-10-09 DIAGNOSIS — M069 Rheumatoid arthritis, unspecified: Secondary | ICD-10-CM

## 2024-10-09 DIAGNOSIS — R5383 Other fatigue: Secondary | ICD-10-CM | POA: Diagnosis not present

## 2024-10-09 DIAGNOSIS — I10 Essential (primary) hypertension: Secondary | ICD-10-CM | POA: Diagnosis not present

## 2024-10-09 NOTE — Progress Notes (Signed)
 Provider:  Dr. Jackalyn Blazing Location:  [facility] Place of Service:  Assisted living    PCP: Mast, [name], NP [name], [name], [name]  Extended Emergency Contact Information Primary Emergency Contact: Debrew,Jacqueline  [place] Home Phone: [phone] Mobile Phone: [phone] Relation: Daughter  Goals of Care: Advanced Directive information    08/12/2024    9:50 AM  Advanced Directives  Does Patient Have a Medical Advance Directive? Yes  Type of Estate Agent of [place];Living will;Out of facility DNR (pink MOST or yellow form)  Does patient want to make changes to medical advance directive? No - Patient declined  Copy of Healthcare Power of Attorney in Chart? Yes - validated most recent copy scanned in chart (See row information)      No chief complaint on file.      History of Present Illness  Pt seen for acute visit for nursing report that pt was complaining of not feeling well Pt seen and examined in her room, usually she stays asleep until late morning. Today she is seen sitting in the living room watching a movie. Pt states that she wasn't felt well yesterday with nausea and hurting all over her body. Pt says she is feeling better Denies runny nose, cough, congestion, abdominal pain, nausea, vomiting, dysuria, hematuria, bloody or dark color stools Pt c/o chronic pain in her legs but no recent change.     Past Medical History:  Diagnosis Date   Adrenal failure    Arthritis    Asthma    Cancer (HCC)    Cataract    Closed nondisplaced fracture of fifth right metatarsal bone 09/18/2017   COPD (chronic obstructive pulmonary disease) (HCC)    Diverticulitis    Per patient   E coli bacteremia    Fibromyalgia 2008   GERD (gastroesophageal reflux disease) 12/18/2016   HCAP (healthcare-associated pneumonia) 02/03/2018   Hyperlipidemia 10/13/2021    Osteoporosis    RA (rheumatoid arthritis) (HCC)    Recurrent upper respiratory infection (URI)    Sepsis due to urinary tract infection (HCC) 01/18/2018   Urticaria    Past Surgical History:  Procedure Laterality Date   BACK SURGERY     BILATERAL CARPAL TUNNEL RELEASE  2005   right and left   CATARACT EXTRACTION, BILATERAL  2004   right and left   CERVICAL FUSION  2011,2010,2008   2 disks   HEEL SPUR SURGERY  2004   lower back fusion  2011   Fusion of 3-4 and 4-5 lower back   RADIOFREQUENCY ABLATION  2020   ROTATOR CUFF REPAIR  [id number]   SQUAMOUS CELL CARCINOMA EXCISION     TONSILLECTOMY AND ADENOIDECTOMY  1947   TOTAL SHOULDER ARTHROPLASTY      reports that she quit smoking about 57 years ago. Her smoking use included cigarettes. She started smoking about 67 years ago. She has a 7.5 pack-year smoking history. She has never been exposed to tobacco smoke. She has never used smokeless tobacco. She reports that she does not drink alcohol and does not use drugs. Social History   Socioeconomic History   Marital status: Widowed    Spouse name: Not on file   Number of children: 2   Years of education: Not on file   Highest education level: Not on file  Occupational History   Occupation: Retired Child Psychotherapist  Tobacco Use   Smoking status: Former  Current packs/day: 0.00    Average packs/day: 0.8 packs/day for 10.0 years (7.5 ttl pk-yrs)    Types: Cigarettes    Start date: 55    Quit date: 1968    Years since quitting: 57.8    Passive exposure: Never   Smokeless tobacco: Never  Vaping Use   Vaping status: Never Used  Substance and Sexual Activity   Alcohol use: No   Drug use: No   Sexual activity: Not Currently  Other Topics Concern   Not on file  Social History Narrative      Diet:        Do you drink/ eat things with caffeine? Dr. Nunzio 2/ day      Marital status: Widowed                              What year were you married ? 1953      Do you live in  a house, apartment,assistred living, condo, trailer, etc.)? Apartment      Is it one or more stories?       How many persons live in your home ? 1      Do you have any pets in your home ?(please list) No      Highest Level of education completed: PhD       Current or past profession: Paramedic, Child Psychotherapist, Special Educator       Do you exercise?   No                           Type & how often       ADVANCED DIRECTIVES (Please bring copies)      Do you have a living will? Yes      Do you have a DNR form?                       If not, do you want to discuss one? Yes      Do you have signed POA?HPOA forms?                 If so, please bring to your appointment Tes      FUNCTIONAL STATUS- To be completed by Spouse / child / Staff       Do you have difficulty bathing or dressing yourself ? No      Do you have difficulty preparing food or eating ? No      Do you have difficulty managing your mediation ? No      Do you have difficulty managing your finances ? No      Do you have difficulty affording your medication ? No      Social Drivers of Corporate Investment Banker Strain: Not on file  Food Insecurity: No Food Insecurity (12/21/2023)   Hunger Vital Sign    Worried About Running Out of Food in the Last Year: Never true    Ran Out of Food in the Last Year: Never true  Transportation Needs: No Transportation Needs (12/21/2023)   PRAPARE - Administrator, Civil Service (Medical): No    Lack of Transportation (Non-Medical): No  Physical Activity: Not on file  Stress: Not on file  Social Connections: Not on file  Intimate Partner Violence: Not At Risk (12/21/2023)   Humiliation, Afraid, Rape, and Kick questionnaire  Fear of Current or Ex-Partner: No    Emotionally Abused: No    Physically Abused: No    Sexually Abused: No    Functional Status Survey:    Family History  Problem Relation Age of Onset   Heart attack Maternal Grandmother    Heart  attack Paternal Grandfather    Breast cancer Mother 7   Diabetes Father    Heart disease Father    Congestive Heart Failure Father 52       Died from   Allergic rhinitis Neg Hx    Asthma Neg Hx    Eczema Neg Hx    Urticaria Neg Hx     Health Maintenance  Topic Date Due   Influenza Vaccine  07/11/2024   COVID-19 Vaccine (7 - 2025-26 season) 08/11/2024   DTaP/Tdap/Td (2 - Td or Tdap) 04/03/2033   Pneumococcal Vaccine: 50+ Years  Completed   DEXA SCAN  Completed   Zoster Vaccines- Shingrix  Completed   Meningococcal B Vaccine  Aged Out    Allergies  Allergen Reactions   Adhesive [Tape] Rash   Celebrex [Celecoxib] Hives   Ciprofibrate Nausea Only   Cymbalta [Duloxetine Hcl] Swelling   Gabitril [Tiagabine] Swelling   Lyrica [Pregabalin] Swelling   Neurontin [Gabapentin] Swelling   Nexium  [Esomeprazole ] Rash   Nsaids Rash   Shrimp [Shellfish Allergy] Anaphylaxis    Per patient shrimp only   Azactam  [Aztreonam ]     Hand swelling    Azelastine  Hcl     Rash    Ciprofloxacin  Other (See Comments)    dizziness   Claritin  [Loratadine ]     Irritability Nervousness    Methotrexate  And Trimetrexate    Nasacort  [Triamcinolone ]     Dizzy    Olopatadine  Other (See Comments)    Pain and lethargy    Other    Sulfamethizole Other (See Comments)    unknown   Zantac [Ranitidine Hcl] Other (See Comments)    unknown   Claritin -D 12 Hour [Loratadine -Pseudoephedrine Er] Anxiety   Keflex  [Cephalexin ] Nausea And Vomiting    Tolerated Ancef    Penicillins Rash    Injection site reaction. Tolerated cefepime  in past. Also reports tolerating a penicillin infusion after this initial rxn ~20 yrs ago.  Has patient had a PCN reaction causing immediate rash, facial/tongue/throat swelling, SOB or lightheadedness with hypotension: No Has patient had a PCN reaction causing severe rash involving mucus membranes or skin necrosis: No Has patient had a PCN reaction that required hospitalization  No Has patient had a PCN reaction occurring within the last 10 years: No     Sulfa Antibiotics Nausea And Vomiting    Outpatient Encounter Medications as of 10/09/2024  Medication Sig   acetaminophen  (TYLENOL ) 325 MG tablet Take 650 mg by mouth 3 (three) times daily.   allopurinol (ZYLOPRIM) 100 MG tablet Take 100 mg by mouth daily at 12 noon.   amLODipine  (NORVASC ) 5 MG tablet Take 5 mg by mouth daily at 12 noon.   atorvastatin  (LIPITOR) 20 MG tablet TAKE 1 TABLET BY MOUTH EVERY DAY   Biotin  5 MG TBDP Take 1 tablet (5 mg total) by mouth daily.   cholecalciferol  (VITAMIN D3) 25 MCG (1000 UNIT) tablet Take 1,000 Units by mouth daily at 12 noon.   cycloSPORINE  (RESTASIS ) 0.05 % ophthalmic emulsion Place 1 drop into both eyes 2 (two) times daily.   D-Mannose 500 MG CAPS Take 500 mg by mouth daily.   diclofenac  Sodium (VOLTAREN ) 1 % GEL APPLY 2 TO  4 GRAMS TOPICALLY TO AFFECTED JOINTS UP TO 4 TIMES DAILY   docusate sodium  (COLACE) 100 MG capsule Take 1 capsule (100 mg total) by mouth 2 (two) times daily.   fexofenadine  (ALLEGRA ) 60 MG tablet Take 60 mg by mouth daily.   ipratropium-albuterol  (DUONEB) 0.5-2.5 (3) MG/3ML SOLN Take 3 mLs by nebulization as needed.   lansoprazole  (PREVACID ) 30 MG capsule TAKE 1 CAPSULE BY MOUTH ONCE DAILY AT NOON   lidocaine  4 % Place 1 patch onto the skin daily. Apply to right ankle   montelukast  (SINGULAIR ) 10 MG tablet TAKE 1 TABLET BY MOUTH EVERYDAY AT BEDTIME   ondansetron  (ZOFRAN ) 4 MG tablet Take 4 mg by mouth as needed for nausea or vomiting.   potassium chloride  SA (KLOR-CON  M) 20 MEQ tablet Take 40 mEq by mouth daily. Give 40 mEq by mouth one time a day for Hypokalemia   predniSONE  (DELTASONE ) 5 MG tablet Take 5 mg by mouth daily at 12 noon.   saccharomyces boulardii (FLORASTOR) 250 MG capsule Take 250 mg by mouth 2 (two) times daily.   Torsemide 40 MG TABS Take 40 mg by mouth daily at 12 noon.   traMADol  (ULTRAM ) 50 MG tablet Take 50 mg by mouth every  6 (six) hours as needed.   No facility-administered encounter medications on file as of 10/09/2024.    Review of Systems  Constitutional:  Negative for chills and fever.  HENT:  Negative for sinus pressure and sore throat.   Respiratory:  Negative for cough, shortness of breath and wheezing.   Cardiovascular:  Negative for chest pain, palpitations and leg swelling.  Gastrointestinal:  Negative for abdominal distention, abdominal pain, blood in stool, constipation, diarrhea, nausea and vomiting.  Genitourinary:  Negative for dysuria.  Neurological:  Negative for dizziness.   Negative unless indicated in HPI.  There were no vitals filed for this visit. There is no height or weight on file to calculate BMI. BP Readings from Last 3 Encounters:  09/08/24 123/66  08/19/24 117/61  08/19/24 117/61   Wt Readings from Last 3 Encounters:  08/19/24 176 lb 14.4 oz (80.2 kg)  08/19/24 176 lb (79.8 kg)  08/14/24 176 lb 14.4 oz (80.2 kg)   Physical Exam Constitutional:      Appearance: Normal appearance.  HENT:     Head: Normocephalic and atraumatic.  Cardiovascular:     Rate and Rhythm: Normal rate and regular rhythm.  Pulmonary:     Effort: Pulmonary effort is normal. No respiratory distress.     Breath sounds: Normal breath sounds. No wheezing.  Abdominal:     General: Bowel sounds are normal. There is no distension.     Tenderness: There is no abdominal tenderness. There is no guarding or rebound.     Comments:    Musculoskeletal:        General: No swelling.  Neurological:     Mental Status: She is alert. Mental status is at baseline.     Sensory: No sensory deficit.     Motor: No weakness.     Labs reviewed: Basic Metabolic Panel: Recent Labs    12/21/23 1639 12/22/23 0338 08/04/24 1854 08/05/24 0303 08/06/24 0308 08/14/24 0000  NA  --    < > 131* 139 141 144  K  --    < > 4.0 4.3 3.6 2.7*  CL  --    < > 98 108 109 101  CO2  --    < > 23 21* 19* 33*  GLUCOSE  --     < > 137* 162* 178*  --   BUN  --    < > 18 16 21  22*  CREATININE 1.07*   < > 1.49* 0.92 0.98 0.9  CALCIUM   --    < > 8.7* 8.5* 8.5* 8.8  MG 1.6*   < > 2.1 1.8 2.1  --   PHOS 3.6  --   --  3.0  --   --    < > = values in this interval not displayed.   Liver Function Tests: Recent Labs    12/26/23 0418 05/23/24 0000 08/04/24 1854 08/05/24 0303 08/14/24 0000  AST 52*   < > 25 25 15   ALT 57*   < > 17 15 15   ALKPHOS 61   < > 89 74 70  BILITOT 0.8  --  1.2 0.7  --   PROT 6.1*  --  7.2 6.3*  --   ALBUMIN  2.8*   < > 3.2* 2.5* 3.4*   < > = values in this interval not displayed.   Recent Labs    12/21/23 1639  LIPASE 20   Recent Labs    12/21/23 1130  AMMONIA 23   CBC: Recent Labs    08/06/24 0308 08/07/24 0304 08/08/24 0812 08/14/24 0000  WBC 18.3* 19.2* 15.4* 9.7  NEUTROABS 16.9* 17.3* 11.2*  --   HGB 8.9* 9.5* 10.1* 10.8*  HCT 30.5* 30.8* 33.0* 34*  MCV 94.1 93.6 92.7  --   PLT 214 228 245 321   Cardiac Enzymes: Recent Labs    08/04/24 1854  CKTOTAL 69   BNP: Invalid input(s): POCBNP Lab Results  Component Value Date   HGBA1C 6.5 08/14/2024   Lab Results  Component Value Date   TSH 1.777 08/04/2024   Lab Results  Component Value Date   VITAMINB12 1,377 (H) 05/26/2022   Lab Results  Component Value Date   FOLATE 26.8 02/06/2018   Lab Results  Component Value Date   IRON 89 02/06/2018   TIBC 281 02/06/2018   FERRITIN 74 02/06/2018    Imaging and Procedures obtained prior to SNF admission: CT Angio Chest Pulmonary Embolism (PE) W or WO Contrast Result Date: 08/05/2024 CLINICAL DATA:  Fever and shortness of breath for 1 day EXAM: CT ANGIOGRAPHY CHEST WITH CONTRAST TECHNIQUE: Multidetector CT imaging of the chest was performed using the standard protocol during bolus administration of intravenous contrast. Multiplanar CT image reconstructions and MIPs were obtained to evaluate the vascular anatomy. RADIATION DOSE REDUCTION: This exam was  performed according to the departmental dose-optimization program which includes automated exposure control, adjustment of the mA and/or kV according to patient size and/or use of iterative reconstruction technique. CONTRAST:  60mL OMNIPAQUE  IOHEXOL  350 MG/ML SOLN COMPARISON:  Chest x-ray from the previous day. FINDINGS: Cardiovascular: Thoracic aorta shows atherosclerotic calcifications without aneurysmal dilatation. No cardiac enlargement is noted. The pulmonary artery shows a normal branching pattern bilaterally. No filling defect to suggest pulmonary embolism is noted. Mild coronary calcifications are noted. Mediastinum/Nodes: Thoracic inlet is within normal limits. No hilar or mediastinal adenopathy is noted. Scattered small mediastinal nodes are seen. The esophagus as visualized is within normal limits. Lungs/Pleura: Lungs are well aerated bilaterally. New bilateral lower lobe consolidation is noted likely representing acute pneumonia. Scattered multiple pulmonary nodules are identified bilaterally similar to that seen on prior exam. Index nodule on the right lies in the upper lobe and measures approximately 10 mm stable in appearance. This is  best seen on image number 67 of series 6. Index nodule on the left lies adjacent to the mediastinum with some apparent cavitation. This measures up to 17 mm also stable from the prior exam. Upper Abdomen: Visualized upper abdomen is unremarkable. Musculoskeletal: No chest wall abnormality. No acute or significant osseous findings. Review of the MIP images confirms the above findings. IMPRESSION: No evidence of pulmonary embolism. New bilateral lower lobe consolidation consistent with multifocal pneumonia. Multiple pulmonary nodules which appears stable from the prior exam. Although metastatic disease deserves consideration these have been present for several previous exams suggesting a more benign etiology. Electronically Signed   By: Oneil Devonshire M.D.   On: 08/05/2024  01:00   DG Chest Port 1 View Result Date: 08/04/2024 CLINICAL DATA:  Questionable sepsis - evaluate for abnormality EXAM: PORTABLE CHEST 1 VIEW COMPARISON:  Chest x-ray 12/21/2023 FINDINGS: The heart and mediastinal contours are unchanged. Atherosclerotic plaque. No focal consolidation. No pulmonary edema. No pleural effusion. No pneumothorax. No acute osseous abnormality.  Cervical spine surgical hardware. IMPRESSION: 1. No active disease. 2.  Aortic Atherosclerosis (ICD10-I70.0). Electronically Signed   By: Morgane  Naveau M.D.   On: 08/04/2024 19:55    Assessment and Plan Assessment & Plan  1. Other fatigue (Primary) Pt seen and examined today  Feels better today  Denies fevers, runny nose, dysuria, abdominal pain  Will monitor   HTN  Cont with amlodipine    RA  Cont with prednisone ,  Cont with tramadol  for chronic pain

## 2024-10-14 ENCOUNTER — Other Ambulatory Visit: Payer: Self-pay | Admitting: Nurse Practitioner

## 2024-10-14 MED ORDER — TRAMADOL HCL 50 MG PO TABS: 50.0000 mg | ORAL_TABLET | Freq: Two times a day (BID) | ORAL | 0 refills | Status: DC

## 2024-10-15 ENCOUNTER — Other Ambulatory Visit: Payer: Self-pay | Admitting: Nurse Practitioner

## 2024-10-15 MED ORDER — TRAMADOL HCL 50 MG PO TABS: 50.0000 mg | ORAL_TABLET | Freq: Two times a day (BID) | ORAL | 0 refills | Status: AC

## 2024-11-20 ENCOUNTER — Other Ambulatory Visit: Payer: Self-pay | Admitting: Nurse Practitioner

## 2024-11-20 MED ORDER — TRAMADOL HCL 50 MG PO TABS: 50.0000 mg | ORAL_TABLET | Freq: Two times a day (BID) | ORAL | 1 refills | Status: DC

## 2024-11-26 ENCOUNTER — Non-Acute Institutional Stay (SKILLED_NURSING_FACILITY): Payer: Self-pay | Admitting: Family Medicine

## 2024-11-26 DIAGNOSIS — G9341 Metabolic encephalopathy: Secondary | ICD-10-CM

## 2024-11-26 DIAGNOSIS — T380X5D Adverse effect of glucocorticoids and synthetic analogues, subsequent encounter: Secondary | ICD-10-CM

## 2024-11-26 DIAGNOSIS — E099 Drug or chemical induced diabetes mellitus without complications: Secondary | ICD-10-CM

## 2024-11-26 DIAGNOSIS — I10 Essential (primary) hypertension: Secondary | ICD-10-CM | POA: Diagnosis not present

## 2024-11-26 DIAGNOSIS — M069 Rheumatoid arthritis, unspecified: Secondary | ICD-10-CM | POA: Diagnosis not present

## 2024-11-26 DIAGNOSIS — N1831 Chronic kidney disease, stage 3a: Secondary | ICD-10-CM

## 2024-11-26 NOTE — Assessment & Plan Note (Addendum)
 Patient does report relief with tramadol  dosage but then pain recurs.  This would suggest that dose is appropriate but frequency of dosing is not so in an effort to control her pain will increase to every 8 hours, tramadol  100 mg.  Continue prednisone  as before

## 2024-11-26 NOTE — Assessment & Plan Note (Addendum)
 Treatment is the same for rheumatoid arthritis with low-dose prednisone , tramadol  and acetaminophen 

## 2024-11-26 NOTE — Assessment & Plan Note (Addendum)
 Blood pressure controlled on amlodipine .  No change indicated

## 2024-11-26 NOTE — Assessment & Plan Note (Addendum)
 A1c is elevated at 6.5 but is not on treatment for frank diabetes

## 2024-11-26 NOTE — Progress Notes (Signed)
 Provider:  Garnette Pinal, MD Location:      Place of Service:     PCP: Mast, [name], NP [name], [name], [name]  Extended Emergency Contact Information Primary Emergency Contact: Debrew,Jacqueline  [place] Home Phone: [phone] Mobile Phone: [phone] Relation: Daughter  Code Status:  Goals of Care: Advanced Directive information    08/12/2024    9:50 AM  Advanced Directives  Does Patient Have a Medical Advance Directive? Yes  Type of Estate Agent of [place];Living will;Out of facility DNR (pink MOST or yellow form)  Does patient want to make changes to medical advance directive? No - Patient declined  Copy of Healthcare Power of Attorney in Chart? Yes - validated most recent copy scanned in chart (See row information)     HPI: Patient is a 89 y.o. female seen today for medical management of chronic problems including: Rheumatoid arthritis, diastolic heart failure, essential hypertension,, COPD, CKD. Have visited her in her apartment in assisted living today.  Her daughter was present at the time of the visit.  Her only complaint today is pain not being managed to her liking.  She is not sure of her medication but in review of the [facility] she is getting tramadol  50 mg 2 tablets twice daily as well as as needed acetaminophen .  She also takes prednisone  5 mg a day for rheumatoid arthritis. She stays in her room most of the time.  She does not go to the dining room and eat with other residents.  Past Medical History:  Diagnosis Date   Adrenal failure    Arthritis    Asthma    Cancer (HCC)    Cataract    Closed nondisplaced fracture of fifth right metatarsal bone 09/18/2017   COPD (chronic obstructive pulmonary disease) (HCC)    Diverticulitis    Per patient   E coli bacteremia    Fibromyalgia 2008   GERD (gastroesophageal reflux disease) 12/18/2016   HCAP (healthcare-associated  pneumonia) 02/03/2018   Hyperlipidemia 10/13/2021   Osteoporosis    RA (rheumatoid arthritis) (HCC)    Recurrent upper respiratory infection (URI)    Sepsis due to urinary tract infection (HCC) 01/18/2018   Urticaria    Past Surgical History:  Procedure Laterality Date   BACK SURGERY     BILATERAL CARPAL TUNNEL RELEASE  2005   right and left   CATARACT EXTRACTION, BILATERAL  2004   right and left   CERVICAL FUSION  2011,2010,2008   2 disks   HEEL SPUR SURGERY  2004   lower back fusion  2011   Fusion of 3-4 and 4-5 lower back   RADIOFREQUENCY ABLATION  2020   ROTATOR CUFF REPAIR  [id number]   SQUAMOUS CELL CARCINOMA EXCISION     TONSILLECTOMY AND ADENOIDECTOMY  1947   TOTAL SHOULDER ARTHROPLASTY      reports that she quit smoking about 58 years ago. Her smoking use included cigarettes. She started smoking about 68 years ago. She has a 7.5 pack-year smoking history. She has never been exposed to tobacco smoke. She has never used smokeless tobacco. She reports that she does not drink alcohol and does not use drugs. Social History   Socioeconomic History   Marital status: Widowed    Spouse name: Not on file   Number of children: 2   Years of education: Not on file   Highest education level: Not on file  Occupational History  Occupation: Retired Child Psychotherapist  Tobacco Use   Smoking status: Former    Current packs/day: 0.00    Average packs/day: 0.8 packs/day for 10.0 years (7.5 ttl pk-yrs)    Types: Cigarettes    Start date: 110    Quit date: 1968    Years since quitting: 58.0    Passive exposure: Never   Smokeless tobacco: Never  Vaping Use   Vaping status: Never Used  Substance and Sexual Activity   Alcohol use: No   Drug use: No   Sexual activity: Not Currently  Other Topics Concern   Not on file  Social History Narrative      Diet:        Do you drink/ eat things with caffeine? Dr. Nunzio 2/ day      Marital status: Widowed                               What year were you married ? 1953      Do you live in a house, apartment,assistred living, condo, trailer, etc.)? Apartment      Is it one or more stories?       How many persons live in your home ? 1      Do you have any pets in your home ?(please list) No      Highest Level of education completed: PhD       Current or past profession: Paramedic, Child Psychotherapist, Special Educator       Do you exercise?   No                           Type & how often       ADVANCED DIRECTIVES (Please bring copies)      Do you have a living will? Yes      Do you have a DNR form?                       If not, do you want to discuss one? Yes      Do you have signed POA?HPOA forms?                 If so, please bring to your appointment Tes      FUNCTIONAL STATUS- To be completed by Spouse / child / Staff       Do you have difficulty bathing or dressing yourself ? No      Do you have difficulty preparing food or eating ? No      Do you have difficulty managing your mediation ? No      Do you have difficulty managing your finances ? No      Do you have difficulty affording your medication ? No      Social Drivers of Health   Tobacco Use: Medium Risk (10/09/2024)   Patient History    Smoking Tobacco Use: Former    Smokeless Tobacco Use: Never    Passive Exposure: Never  Programmer, Applications: Not on file  Food Insecurity: No Food Insecurity (12/21/2023)   Hunger Vital Sign    Worried About Running Out of Food in the Last Year: Never true    Ran Out of Food in the Last Year: Never true  Transportation Needs: No Transportation Needs (12/21/2023)   PRAPARE - Administrator, Civil Service (Medical):  No    Lack of Transportation (Non-Medical): No  Physical Activity: Not on file  Stress: Not on file  Social Connections: Not on file  Intimate Partner Violence: Not At Risk (12/21/2023)   Humiliation, Afraid, Rape, and Kick questionnaire    Fear of Current or Ex-Partner: No     Emotionally Abused: No    Physically Abused: No    Sexually Abused: No  Depression (PHQ2-9): Low Risk (11/05/2023)   Depression (PHQ2-9)    PHQ-2 Score: 0  Alcohol Screen: Not on file  Housing: Low Risk (12/21/2023)   Housing Stability Vital Sign    Unable to Pay for Housing in the Last Year: No    Number of Times Moved in the Last Year: 0    Homeless in the Last Year: No  Utilities: Not At Risk (12/21/2023)   AHC Utilities    Threatened with loss of utilities: No  Health Literacy: Not on file    Functional Status Survey:    Family History  Problem Relation Age of Onset   Heart attack Maternal Grandmother    Heart attack Paternal Grandfather    Breast cancer Mother 6   Diabetes Father    Heart disease Father    Congestive Heart Failure Father 48       Died from   Allergic rhinitis Neg Hx    Asthma Neg Hx    Eczema Neg Hx    Urticaria Neg Hx     Health Maintenance  Topic Date Due   Influenza Vaccine  07/11/2024   COVID-19 Vaccine (7 - 2025-26 season) 08/11/2024   DTaP/Tdap/Td (2 - Td or Tdap) 04/03/2033   Pneumococcal Vaccine: 50+ Years  Completed   Bone Density Scan  Completed   Zoster Vaccines- Shingrix  Completed   Meningococcal B Vaccine  Aged Out    Allergies[1]  Outpatient Encounter Medications as of 11/26/2024  Medication Sig   acetaminophen  (TYLENOL ) 325 MG tablet Take 650 mg by mouth 3 (three) times daily.   allopurinol (ZYLOPRIM) 100 MG tablet Take 100 mg by mouth daily at 12 noon.   amLODipine  (NORVASC ) 5 MG tablet Take 5 mg by mouth daily at 12 noon.   atorvastatin  (LIPITOR) 20 MG tablet TAKE 1 TABLET BY MOUTH EVERY DAY   Biotin  5 MG TBDP Take 1 tablet (5 mg total) by mouth daily.   cholecalciferol  (VITAMIN D3) 25 MCG (1000 UNIT) tablet Take 1,000 Units by mouth daily at 12 noon.   cycloSPORINE  (RESTASIS ) 0.05 % ophthalmic emulsion Place 1 drop into both eyes 2 (two) times daily.   D-Mannose 500 MG CAPS Take 500 mg by mouth daily.   diclofenac   Sodium (VOLTAREN ) 1 % GEL APPLY 2 TO 4 GRAMS TOPICALLY TO AFFECTED JOINTS UP TO 4 TIMES DAILY   docusate sodium  (COLACE) 100 MG capsule Take 1 capsule (100 mg total) by mouth 2 (two) times daily.   fexofenadine  (ALLEGRA ) 60 MG tablet Take 60 mg by mouth daily.   ipratropium-albuterol  (DUONEB) 0.5-2.5 (3) MG/3ML SOLN Take 3 mLs by nebulization as needed.   lansoprazole  (PREVACID ) 30 MG capsule TAKE 1 CAPSULE BY MOUTH ONCE DAILY AT NOON   lidocaine  4 % Place 1 patch onto the skin daily. Apply to right ankle   montelukast  (SINGULAIR ) 10 MG tablet TAKE 1 TABLET BY MOUTH EVERYDAY AT BEDTIME   ondansetron  (ZOFRAN ) 4 MG tablet Take 4 mg by mouth as needed for nausea or vomiting.   potassium chloride  SA (KLOR-CON  M) 20 MEQ tablet Take  40 mEq by mouth daily. Give 40 mEq by mouth one time a day for Hypokalemia   predniSONE  (DELTASONE ) 5 MG tablet Take 5 mg by mouth daily at 12 noon.   saccharomyces boulardii (FLORASTOR) 250 MG capsule Take 250 mg by mouth 2 (two) times daily.   Torsemide 40 MG TABS Take 40 mg by mouth daily at 12 noon.   traMADol  (ULTRAM ) 50 MG tablet Take 1 tablet (50 mg total) by mouth 2 (two) times daily.   No facility-administered encounter medications on file as of 11/26/2024.    Review of Systems  Constitutional:  Positive for fatigue.  HENT: Negative.    Respiratory: Negative.    Cardiovascular: Negative.   Musculoskeletal:  Positive for arthralgias, back pain, gait problem and myalgias.  Psychiatric/Behavioral: Negative.    All other systems reviewed and are negative.   There were no vitals filed for this visit. There is no height or weight on file to calculate BMI. Physical Exam Vitals and nursing note reviewed.  Constitutional:      Appearance: Normal appearance. She is obese.  HENT:     Head: Normocephalic.  Eyes:     Pupils: Pupils are equal, round, and reactive to light.  Cardiovascular:     Rate and Rhythm: Normal rate and regular rhythm.  Pulmonary:      Effort: Pulmonary effort is normal.     Breath sounds: Normal breath sounds.  Abdominal:     Palpations: Abdomen is soft.  Musculoskeletal:        General: Swelling and tenderness present.     Cervical back: Normal range of motion.     Comments: Especially in joints of hands and fingers  Neurological:     General: No focal deficit present.     Mental Status: She is alert and oriented to person, place, and time.  Psychiatric:        Mood and Affect: Mood normal.        Behavior: Behavior normal.     Labs reviewed: Basic Metabolic Panel: Recent Labs    12/21/23 1639 12/22/23 0338 08/04/24 1854 08/05/24 0303 08/06/24 0308 08/14/24 0000  NA  --    < > 131* 139 141 144  K  --    < > 4.0 4.3 3.6 2.7*  CL  --    < > 98 108 109 101  CO2  --    < > 23 21* 19* 33*  GLUCOSE  --    < > 137* 162* 178*  --   BUN  --    < > 18 16 21  22*  CREATININE 1.07*   < > 1.49* 0.92 0.98 0.9  CALCIUM   --    < > 8.7* 8.5* 8.5* 8.8  MG 1.6*   < > 2.1 1.8 2.1  --   PHOS 3.6  --   --  3.0  --   --    < > = values in this interval not displayed.   Liver Function Tests: Recent Labs    12/26/23 0418 05/23/24 0000 08/04/24 1854 08/05/24 0303 08/14/24 0000  AST 52*   < > 25 25 15   ALT 57*   < > 17 15 15   ALKPHOS 61   < > 89 74 70  BILITOT 0.8  --  1.2 0.7  --   PROT 6.1*  --  7.2 6.3*  --   ALBUMIN  2.8*   < > 3.2* 2.5* 3.4*   < > = values in  this interval not displayed.   Recent Labs    12/21/23 1639  LIPASE 20   Recent Labs    12/21/23 1130  AMMONIA 23   CBC: Recent Labs    08/06/24 0308 08/07/24 0304 08/08/24 0812 08/14/24 0000  WBC 18.3* 19.2* 15.4* 9.7  NEUTROABS 16.9* 17.3* 11.2*  --   HGB 8.9* 9.5* 10.1* 10.8*  HCT 30.5* 30.8* 33.0* 34*  MCV 94.1 93.6 92.7  --   PLT 214 228 245 321   Cardiac Enzymes: Recent Labs    08/04/24 1854  CKTOTAL 69   BNP: Invalid input(s): POCBNP Lab Results  Component Value Date   HGBA1C 6.5 08/14/2024   Lab Results  Component  Value Date   TSH 1.777 08/04/2024   Lab Results  Component Value Date   VITAMINB12 1,377 (H) 05/26/2022   Lab Results  Component Value Date   FOLATE 26.8 02/06/2018   Lab Results  Component Value Date   IRON 89 02/06/2018   TIBC 281 02/06/2018   FERRITIN 74 02/06/2018    Imaging and Procedures obtained prior to SNF admission: CT Angio Chest Pulmonary Embolism (PE) W or WO Contrast Result Date: 08/05/2024 CLINICAL DATA:  Fever and shortness of breath for 1 day EXAM: CT ANGIOGRAPHY CHEST WITH CONTRAST TECHNIQUE: Multidetector CT imaging of the chest was performed using the standard protocol during bolus administration of intravenous contrast. Multiplanar CT image reconstructions and MIPs were obtained to evaluate the vascular anatomy. RADIATION DOSE REDUCTION: This exam was performed according to the departmental dose-optimization program which includes automated exposure control, adjustment of the mA and/or kV according to patient size and/or use of iterative reconstruction technique. CONTRAST:  60mL OMNIPAQUE  IOHEXOL  350 MG/ML SOLN COMPARISON:  Chest x-ray from the previous day. FINDINGS: Cardiovascular: Thoracic aorta shows atherosclerotic calcifications without aneurysmal dilatation. No cardiac enlargement is noted. The pulmonary artery shows a normal branching pattern bilaterally. No filling defect to suggest pulmonary embolism is noted. Mild coronary calcifications are noted. Mediastinum/Nodes: Thoracic inlet is within normal limits. No hilar or mediastinal adenopathy is noted. Scattered small mediastinal nodes are seen. The esophagus as visualized is within normal limits. Lungs/Pleura: Lungs are well aerated bilaterally. New bilateral lower lobe consolidation is noted likely representing acute pneumonia. Scattered multiple pulmonary nodules are identified bilaterally similar to that seen on prior exam. Index nodule on the right lies in the upper lobe and measures approximately 10 mm stable  in appearance. This is best seen on image number 67 of series 6. Index nodule on the left lies adjacent to the mediastinum with some apparent cavitation. This measures up to 17 mm also stable from the prior exam. Upper Abdomen: Visualized upper abdomen is unremarkable. Musculoskeletal: No chest wall abnormality. No acute or significant osseous findings. Review of the MIP images confirms the above findings. IMPRESSION: No evidence of pulmonary embolism. New bilateral lower lobe consolidation consistent with multifocal pneumonia. Multiple pulmonary nodules which appears stable from the prior exam. Although metastatic disease deserves consideration these have been present for several previous exams suggesting a more benign etiology. Electronically Signed   By: Oneil Devonshire M.D.   On: 08/05/2024 01:00   DG Chest Port 1 View Result Date: 08/04/2024 CLINICAL DATA:  Questionable sepsis - evaluate for abnormality EXAM: PORTABLE CHEST 1 VIEW COMPARISON:  Chest x-ray 12/21/2023 FINDINGS: The heart and mediastinal contours are unchanged. Atherosclerotic plaque. No focal consolidation. No pulmonary edema. No pleural effusion. No pneumothorax. No acute osseous abnormality.  Cervical spine surgical hardware. IMPRESSION: 1.  No active disease. 2.  Aortic Atherosclerosis (ICD10-I70.0). Electronically Signed   By: Morgane  Naveau M.D.   On: 08/04/2024 19:55    Assessment/Plan Assessment & Plan Rheumatoid arthritis involving multiple sites, unspecified whether rheumatoid factor present [facility]) Patient does report relief with tramadol  dosage but then pain recurs.  This would suggest that dose is appropriate but frequency of dosing is not so in an effort to control her pain will increase to every 8 hours, tramadol  100 mg.  Continue prednisone  as before Essential hypertension Blood pressure controlled on amlodipine .  No change indicated Steroid-induced diabetes mellitus, subsequent encounter A1c is elevated at 6.5 but is not  on treatment for frank diabetes Acute metabolic encephalopathy Treatment is the same for rheumatoid arthritis with low-dose prednisone , tramadol  and acetaminophen  Stage 3a chronic kidney disease (HCC) Did not see recent GFR but creatinine is stable less than 1  Family/ staff Communication: discussed with daughter  Labs/tests ordered: none  .[email or web address]     [1]  Allergies Allergen Reactions   Adhesive [Tape] Rash   Celebrex [Celecoxib] Hives   Ciprofibrate Nausea Only   Cymbalta [Duloxetine Hcl] Swelling   Gabitril [Tiagabine] Swelling   Lyrica [Pregabalin] Swelling   Neurontin [Gabapentin] Swelling   Nexium  [Esomeprazole ] Rash   Nsaids Rash   Shrimp [Shellfish Allergy] Anaphylaxis    Per patient shrimp only   Azactam  [Aztreonam ]     Hand swelling    Azelastine  Hcl     Rash    Ciprofloxacin  Other (See Comments)    dizziness   Claritin  [Loratadine ]     Irritability Nervousness    Methotrexate  And Trimetrexate    Nasacort  [Triamcinolone ]     Dizzy    Olopatadine  Other (See Comments)    Pain and lethargy    Other    Sulfamethizole Other (See Comments)    unknown   Zantac [Ranitidine Hcl] Other (See Comments)    unknown   Claritin -D 12 Hour [Loratadine -Pseudoephedrine Er] Anxiety   Keflex  [Cephalexin ] Nausea And Vomiting    Tolerated Ancef    Penicillins Rash    Injection site reaction. Tolerated cefepime  in past. Also reports tolerating a penicillin infusion after this initial rxn ~20 yrs ago.  Has patient had a PCN reaction causing immediate rash, facial/tongue/throat swelling, SOB or lightheadedness with hypotension: No Has patient had a PCN reaction causing severe rash involving mucus membranes or skin necrosis: No Has patient had a PCN reaction that required hospitalization No Has patient had a PCN reaction occurring within the last 10 years: No     Sulfa Antibiotics Nausea And Vomiting

## 2024-11-26 NOTE — Assessment & Plan Note (Addendum)
 Did not see recent GFR but creatinine is stable less than 1

## 2024-12-08 ENCOUNTER — Other Ambulatory Visit: Payer: Self-pay | Admitting: Nurse Practitioner

## 2024-12-08 MED ORDER — TRAMADOL HCL ER 100 MG PO TB24: 100.0000 mg | ORAL_TABLET | Freq: Every day | ORAL | 1 refills | Status: DC

## 2024-12-10 ENCOUNTER — Other Ambulatory Visit: Payer: Self-pay | Admitting: Nurse Practitioner

## 2024-12-10 MED ORDER — TRAMADOL HCL ER 100 MG PO TB24: 100.0000 mg | ORAL_TABLET | Freq: Three times a day (TID) | ORAL | 1 refills | Status: AC

## 2024-12-12 ENCOUNTER — Telehealth: Payer: Self-pay

## 2024-12-12 NOTE — Telephone Encounter (Signed)
 See triage notes from triage nurse. Please Advise

## 2024-12-12 NOTE — Telephone Encounter (Signed)
 Copied from CRM [id number]. Topic: Clinical - Prescription Issue >> Dec 12, 2024 12:07 PM Suzette B wrote: Reason for CRM: traMADol  (ULTRAM -ER) 100 MG 24 hr tablet [facility] - [place], [place] - [address]    Theresa from [place] is needing to know if this should be 3 times per day  due to it being an extended release

## 2024-12-15 NOTE — Telephone Encounter (Signed)
 Noted

## 2025-01-01 ENCOUNTER — Encounter: Payer: Self-pay | Admitting: Nurse Practitioner

## 2025-01-01 ENCOUNTER — Non-Acute Institutional Stay: Payer: Self-pay | Admitting: Nurse Practitioner

## 2025-01-01 DIAGNOSIS — Z0000 Encounter for general adult medical examination without abnormal findings: Secondary | ICD-10-CM

## 2025-01-02 NOTE — Progress Notes (Signed)
 "  Chief Complaint  Patient presents with   Medicare Wellness    Annual wellness visit. Discuss need for      Subjective:   Eileen Wilkerson is a 89 y.o. female who presents for a Medicare Annual Wellness Visit @ [facility]  Visit info / Clinical Intake: Medicare Wellness Visit Type:: Subsequent Annual Wellness Visit Persons participating in visit and providing information:: patient Patient's Overall Health Status Rating: (!) fair Typical amount of pain: (!) a lot Does pain affect daily life?: (S) (!) yes (limited ambulation) Are you currently prescribed opioids?: (!) yes (Tramadol )  Dietary Habits and Nutritional Risks How many meals a day?: 3 Eats fruit and vegetables daily?: yes Most meals are obtained by: having others provide food In the last 2 weeks, have you had any of the following?: none  Functional Status Manage your own finances?: (!) no Primary transportation is: facility / other Concerns about vision?: no *vision screening is required for [facility]*  Fall Screening Falls in the past year?: 1 Number of falls in past year: 1 Was there an injury with Fall?: 0 Fall Risk Category Calculator: 2 Patient Fall Risk Level: Moderate Fall Risk  Fall Risk Patient at Risk for Falls Due to: History of fall(s); Impaired balance/gait; Impaired mobility Fall risk Follow up: Falls evaluation completed; Education provided; Falls prevention discussed  Home and Transportation Safety: All rugs have non-skid backing?: yes All stairs or steps have railings?: yes Grab bars in the bathtub or shower?: yes Have non-skid surface in bathtub or shower?: yes Good home lighting?: yes Regular seat belt use?: yes  Cognitive Assessment Difficulty concentrating, remembering, or making decisions? : yes  Advance Directives (For Healthcare) Does Patient Have a Medical Advance Directive?: Yes Does patient want to make changes to medical advance directive?: No - Patient declined Type of Advance  Directive: Healthcare Power of [place]; Living will; Out of facility DNR (pink MOST or yellow form) Copy of Healthcare Power of Attorney in Chart?: Yes - validated most recent copy scanned in chart (See row information) Copy of Living Will in Chart?: Yes - validated most recent copy scanned in chart (See row information) Out of facility DNR (pink MOST or yellow form) in Chart? (Ambulatory ONLY): (S) No - copy requested (Need a copy from [facility])  Reviewed/Updated  Reviewed/Updated: Reviewed All (Medical, Surgical, Family, Medications, Allergies, Care Teams, Patient Goals)    Allergies (verified) Adhesive [tape], Celebrex [celecoxib], Ciprofibrate, Cymbalta [duloxetine hcl], Gabitril [tiagabine], Lyrica [pregabalin], Neurontin [gabapentin], Nexium  [esomeprazole ], Nsaids, Shrimp [shellfish allergy], Azactam  [aztreonam ], Azelastine  hcl, Ciprofloxacin , Claritin  [loratadine ], Methotrexate  and trimetrexate, Nasacort  [triamcinolone ], Olopatadine , Other, Sulfamethizole, Zantac [ranitidine hcl], Claritin -d 12 hour [loratadine -pseudoephedrine er], Keflex  [cephalexin ], Penicillins, and Sulfa antibiotics   Current Medications (verified) Outpatient Encounter Medications as of 01/01/2025  Medication Sig   acetaminophen  (TYLENOL ) 325 MG tablet Take 650 mg by mouth 3 (three) times daily.   allopurinol (ZYLOPRIM) 100 MG tablet Take 100 mg by mouth daily at 12 noon.   amLODipine  (NORVASC ) 5 MG tablet Take 5 mg by mouth daily at 12 noon.   atorvastatin  (LIPITOR) 20 MG tablet TAKE 1 TABLET BY MOUTH EVERY DAY   Biotin  5 MG TBDP Take 1 tablet (5 mg total) by mouth daily.   cholecalciferol  (VITAMIN D3) 25 MCG (1000 UNIT) tablet Take 1,000 Units by mouth daily at 12 noon.   cycloSPORINE  (RESTASIS ) 0.05 % ophthalmic emulsion Place 1 drop into both eyes 2 (two) times daily.   D-Mannose 500 MG CAPS Take 500  mg by mouth daily.   diclofenac  Sodium (VOLTAREN ) 1 % GEL APPLY 2 TO 4 GRAMS TOPICALLY TO AFFECTED  JOINTS UP TO 4 TIMES DAILY   docusate sodium  (COLACE) 100 MG capsule Take 1 capsule (100 mg total) by mouth 2 (two) times daily.   fexofenadine  (ALLEGRA ) 60 MG tablet Take 60 mg by mouth daily.   ipratropium-albuterol  (DUONEB) 0.5-2.5 (3) MG/3ML SOLN Take 3 mLs by nebulization as needed.   lansoprazole  (PREVACID ) 30 MG capsule TAKE 1 CAPSULE BY MOUTH ONCE DAILY AT NOON   lidocaine  4 % Place 1 patch onto the skin daily. Apply to right ankle   montelukast  (SINGULAIR ) 10 MG tablet TAKE 1 TABLET BY MOUTH EVERYDAY AT BEDTIME   ondansetron  (ZOFRAN ) 4 MG tablet Take 4 mg by mouth as needed for nausea or vomiting.   potassium chloride  SA (KLOR-CON  M) 20 MEQ tablet Take 40 mEq by mouth daily. Give 40 mEq by mouth one time a day for Hypokalemia   predniSONE  (DELTASONE ) 5 MG tablet Take 5 mg by mouth daily at 12 noon.   saccharomyces boulardii (FLORASTOR) 250 MG capsule Take 250 mg by mouth 2 (two) times daily.   Torsemide 40 MG TABS Take 40 mg by mouth daily at 12 noon.   traMADol  (ULTRAM -ER) 100 MG 24 hr tablet Take 1 tablet (100 mg total) by mouth 3 (three) times daily.   No facility-administered encounter medications on file as of 01/01/2025.    History: Past Medical History:  Diagnosis Date   Adrenal failure    Arthritis    Asthma    Cancer (HCC)    Cataract    Closed nondisplaced fracture of fifth right metatarsal bone 09/18/2017   COPD (chronic obstructive pulmonary disease) (HCC)    Diverticulitis    Per patient   E coli bacteremia    Fibromyalgia 2008   GERD (gastroesophageal reflux disease) 12/18/2016   HCAP (healthcare-associated pneumonia) 02/03/2018   Hyperlipidemia 10/13/2021   Osteoporosis    RA (rheumatoid arthritis) (HCC)    Recurrent upper respiratory infection (URI)    Sepsis due to urinary tract infection (HCC) 01/18/2018   Urticaria    Past Surgical History:  Procedure Laterality Date   BACK SURGERY     BILATERAL CARPAL TUNNEL RELEASE  2005   right and left    CATARACT EXTRACTION, BILATERAL  2004   right and left   CERVICAL FUSION  2011,2010,2008   2 disks   HEEL SPUR SURGERY  2004   lower back fusion  2011   Fusion of 3-4 and 4-5 lower back   RADIOFREQUENCY ABLATION  2020   ROTATOR CUFF REPAIR  [id number]   SQUAMOUS CELL CARCINOMA EXCISION     TONSILLECTOMY AND ADENOIDECTOMY  1947   TOTAL SHOULDER ARTHROPLASTY     Family History  Problem Relation Age of Onset   Heart attack Maternal Grandmother    Heart attack Paternal Grandfather    Breast cancer Mother 59   Diabetes Father    Heart disease Father    Congestive Heart Failure Father 50       Died from   Allergic rhinitis Neg Hx    Asthma Neg Hx    Eczema Neg Hx    Urticaria Neg Hx    Social History   Occupational History   Occupation: Retired Child Psychotherapist  Tobacco Use   Smoking status: Former    Current packs/day: 0.00    Average packs/day: 0.8 packs/day for 10.0 years (7.5 ttl pk-yrs)  Types: Cigarettes    Start date: 65    Quit date: 1968    Years since quitting: 58.1    Passive exposure: Never   Smokeless tobacco: Never  Vaping Use   Vaping status: Never Used  Substance and Sexual Activity   Alcohol use: No   Drug use: No   Sexual activity: Not Currently   Tobacco Counseling Counseling given: Not Answered  SDOH Screenings   Food Insecurity: No Food Insecurity (12/21/2023)  Housing: Low Risk (12/21/2023)  Transportation Needs: No Transportation Needs (12/21/2023)  Utilities: Not At Risk (12/21/2023)  Depression (PHQ2-9): Low Risk (11/05/2023)  Tobacco Use: Medium Risk (01/01/2025)   See flowsheets for full screening details  Depression Screen     Goals Addressed   None          Objective:    Today's Vitals   01/01/25 0931  BP: (!) 117/58  Pulse: 61  Resp: 19  SpO2: 95%  Weight: 166 lb 9.6 oz (75.6 kg)  Height: 5' 5 (1.651 m)  PainSc: 3    Body mass index is 27.72 kg/m.  Hearing/Vision screen No results found. Immunizations and  Health Maintenance Health Maintenance  Topic Date Due   COVID-19 Vaccine (7 - Moderna risk 2025-26 season) 04/06/2025   DTaP/Tdap/Td (2 - Td or Tdap) 04/03/2033   Pneumococcal Vaccine: 50+ Years  Completed   Influenza Vaccine  Completed   Bone Density Scan  Completed   Zoster Vaccines- Shingrix  Completed   Meningococcal B Vaccine  Aged Out        Assessment/Plan:  This is a routine wellness examination for Eileen Wilkerson.  Patient Care Team: Virginie Josten X, [name]  I have personally reviewed and noted the following in the patients chart:   Medical and social history Use of alcohol, tobacco or illicit drugs  Current medications and supplements including opioid prescriptions. Functional ability and status Nutritional status Physical activity Advanced directives List of other physicians Hospitalizations, surgeries, and ER visits in previous 12 months Vitals Screenings to include cognitive, depression, and falls Referrals and appointments  No orders of the defined types were placed in this encounter.  In addition, I have reviewed and discussed with patient certain preventive protocols, quality metrics, and best practice recommendations. A written personalized care plan for preventive services as well as general preventive health recommendations were provided to patient.   Sonia Stickels X Zayana Salvador, NP   01/02/2025   Return in 1 year (on 01/01/2026).  After Visit Summary: (In Person-Declined) Patient declined AVS at this time.   "

## 2025-01-07 ENCOUNTER — Non-Acute Institutional Stay (SKILLED_NURSING_FACILITY): Payer: Self-pay | Admitting: Nurse Practitioner

## 2025-01-07 ENCOUNTER — Encounter: Payer: Self-pay | Admitting: Nurse Practitioner

## 2025-01-07 DIAGNOSIS — M5116 Intervertebral disc disorders with radiculopathy, lumbar region: Secondary | ICD-10-CM | POA: Diagnosis not present

## 2025-01-07 DIAGNOSIS — M069 Rheumatoid arthritis, unspecified: Secondary | ICD-10-CM | POA: Diagnosis not present

## 2025-01-07 DIAGNOSIS — K219 Gastro-esophageal reflux disease without esophagitis: Secondary | ICD-10-CM | POA: Diagnosis not present

## 2025-01-07 DIAGNOSIS — M1A9XX0 Chronic gout, unspecified, without tophus (tophi): Secondary | ICD-10-CM

## 2025-01-07 DIAGNOSIS — J449 Chronic obstructive pulmonary disease, unspecified: Secondary | ICD-10-CM | POA: Diagnosis not present

## 2025-01-07 DIAGNOSIS — M810 Age-related osteoporosis without current pathological fracture: Secondary | ICD-10-CM | POA: Diagnosis not present

## 2025-01-07 DIAGNOSIS — E876 Hypokalemia: Secondary | ICD-10-CM

## 2025-01-07 DIAGNOSIS — I10 Essential (primary) hypertension: Secondary | ICD-10-CM

## 2025-01-07 DIAGNOSIS — I5032 Chronic diastolic (congestive) heart failure: Secondary | ICD-10-CM

## 2025-01-07 DIAGNOSIS — E782 Mixed hyperlipidemia: Secondary | ICD-10-CM

## 2025-01-07 DIAGNOSIS — R7303 Prediabetes: Secondary | ICD-10-CM | POA: Diagnosis not present

## 2025-01-07 DIAGNOSIS — E538 Deficiency of other specified B group vitamins: Secondary | ICD-10-CM

## 2025-01-07 DIAGNOSIS — R000 Tachycardia, unspecified: Secondary | ICD-10-CM | POA: Diagnosis not present

## 2025-01-07 NOTE — Assessment & Plan Note (Signed)
 DOE, on and off wheezing, cough, occasional yellow phlegm in am. EF 60-65% 05/22/22, on Torsemide, Amlodipine 

## 2025-01-07 NOTE — Assessment & Plan Note (Signed)
 COPD/Asthma Epinephrine  prn, Allegra , Singulair , DuoNeb

## 2025-01-07 NOTE — Assessment & Plan Note (Signed)
 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  Diclofenac , MRI completed, Neurosurgery consulted, not a good surgical candidate.

## 2025-01-07 NOTE — Assessment & Plan Note (Signed)
 Taking Kcl

## 2025-01-07 NOTE — Assessment & Plan Note (Signed)
 Blood pressure is controlled on Amlodipine . Bun/creat 21/0.98 08/06/24

## 2025-01-07 NOTE — Progress Notes (Unsigned)
 " Location:  [facility] Room Number: [id number] Place of Service:  [facility] (13) Provider: Mast, [name], NP  [name], [name], [name]  Extended Emergency Contact Information Primary Emergency Contact: Debrew,Jacqueline  [place] Home Phone: [phone] Mobile Phone: [phone] Relation: Daughter  Code Status:  DNR Goals of care: Advanced Directive information    08/12/2024    9:50 AM  Advanced Directives  Does Patient Have a Medical Advance Directive? Yes  Type of Estate Agent of [place];Living will;Out of facility DNR (pink MOST or yellow form)  Does patient want to make changes to medical advance directive? No - Patient declined  Copy of Healthcare Power of Attorney in Chart? Yes - validated most recent copy scanned in chart (See row information)     Chief Complaint  Patient presents with   Medication management of chronic issues    Routine    HPI:  Pt is a 89 y.o. female seen today for medical management of chronic diseases.     Past Medical History:  Diagnosis Date   Adrenal failure    Allergy will provide list   Arthritis    Asthma    Cancer (HCC)    Cataract    Closed nondisplaced fracture of fifth right metatarsal bone 09/18/2017   COPD (chronic obstructive pulmonary disease) (HCC)    Diverticulitis    Per patient   E coli bacteremia    Fibromyalgia 2008   GERD (gastroesophageal reflux disease) 12/18/2016   HCAP (healthcare-associated pneumonia) 02/03/2018   Hyperlipidemia 10/13/2021   Osteoporosis    RA (rheumatoid arthritis) (HCC)    Recurrent upper respiratory infection (URI)    Sepsis due to urinary tract infection (HCC) 01/18/2018   Urticaria    Past Surgical History:  Procedure Laterality Date   BACK SURGERY     BILATERAL CARPAL TUNNEL RELEASE  2005   right and left   CATARACT EXTRACTION, BILATERAL  2004   right and left   CERVICAL  FUSION  2011,2010,2008   2 disks   EYE SURGERY  2004   R and left cateracts   HEEL SPUR SURGERY  2004   lower back fusion  2011   Fusion of 3-4 and 4-5 lower back   RADIOFREQUENCY ABLATION  2020   ROTATOR CUFF REPAIR  [id number]   SQUAMOUS CELL CARCINOMA EXCISION     TONSILLECTOMY AND ADENOIDECTOMY  1947   TOTAL SHOULDER ARTHROPLASTY      Allergies[1]  Outpatient Encounter Medications as of 01/07/2025  Medication Sig   acetaminophen  (TYLENOL ) 325 MG tablet Take 650 mg by mouth 3 (three) times daily.   allopurinol (ZYLOPRIM) 100 MG tablet Take 100 mg by mouth daily at 12 noon.   amLODipine  (NORVASC ) 5 MG tablet Take 5 mg by mouth daily at 12 noon.   atorvastatin  (LIPITOR) 20 MG tablet TAKE 1 TABLET BY MOUTH EVERY DAY   Biotin  5 MG TBDP Take 1 tablet (5 mg total) by mouth daily.   cholecalciferol  (VITAMIN D3) 25 MCG (1000 UNIT) tablet Take 1,000 Units by mouth daily at 12 noon.   cycloSPORINE  (RESTASIS ) 0.05 % ophthalmic emulsion Place 1 drop into both eyes 2 (two) times daily.   D-Mannose 500 MG CAPS Take 500 mg by mouth daily.   diclofenac  Sodium (VOLTAREN ) 1 % GEL APPLY 2 TO 4 GRAMS TOPICALLY TO AFFECTED JOINTS UP TO 4 TIMES DAILY   docusate sodium  (COLACE) 100 MG  capsule Take 1 capsule (100 mg total) by mouth 2 (two) times daily.   fexofenadine  (ALLEGRA ) 60 MG tablet Take 60 mg by mouth daily.   ipratropium-albuterol  (DUONEB) 0.5-2.5 (3) MG/3ML SOLN Take 3 mLs by nebulization as needed.   lansoprazole  (PREVACID ) 30 MG capsule TAKE 1 CAPSULE BY MOUTH ONCE DAILY AT NOON   lidocaine  4 % Place 1 patch onto the skin daily. Apply to right ankle   montelukast  (SINGULAIR ) 10 MG tablet TAKE 1 TABLET BY MOUTH EVERYDAY AT BEDTIME   ondansetron  (ZOFRAN ) 4 MG tablet Take 4 mg by mouth as needed for nausea or vomiting.   potassium chloride  SA (KLOR-CON  M) 20 MEQ tablet Take 40 mEq by mouth daily. Give 40 mEq by mouth one time a day for Hypokalemia   predniSONE  (DELTASONE ) 5 MG tablet Take 5 mg  by mouth daily at 12 noon.   saccharomyces boulardii (FLORASTOR) 250 MG capsule Take 250 mg by mouth 2 (two) times daily.   Torsemide 40 MG TABS Take 40 mg by mouth daily at 12 noon.   traMADol  (ULTRAM -ER) 100 MG 24 hr tablet Take 1 tablet (100 mg total) by mouth 3 (three) times daily.   No facility-administered encounter medications on file as of 01/07/2025.    Review of Systems  Immunization History  Administered Date(s) Administered   Fluad Quad(high Dose 65+) 09/30/2020   INFLUENZA, HIGH DOSE SEASONAL PF 09/24/2015, 08/24/2016, 08/16/2018, 09/08/2019, 09/24/2020, 10/04/2021, 09/12/2023, 09/16/2024   Influenza,inj,Quad PF,6+ Mos 08/11/2016   Influenza-Unspecified 10/04/2012, 08/19/2013, 08/28/2014, 11/12/2017, 10/05/2022   Moderna Covid-19 Vaccine Bivalent Booster 21yrs & up 10/04/2021, 09/26/2023   Moderna Sars-Covid-2 Vaccination 12/15/2019, 01/12/2020, 10/19/2020   PNEUMOCOCCAL CONJUGATE-20 10/04/2021   PPD Test 03/01/2018   Pneumococcal Conjugate-13 09/13/2015, 06/26/2017   Pneumococcal Polysaccharide-23 12/11/2009   Tdap 04/04/2023   Unspecified SARS-COV-2 Vaccination 10/06/2024   Zoster Recombinant(Shingrix) 01/28/2019, 09/02/2019   Pertinent  Health Maintenance Due  Topic Date Due   Influenza Vaccine  Completed   Bone Density Scan  Completed      02/19/2023    2:29 PM 03/06/2023   11:57 AM 03/13/2023   10:48 AM 03/22/2023   10:53 AM 01/02/2025    3:39 PM  Fall Risk  Falls in the past year? 1 0 0 0 1  Was there an injury with Fall? 1  0  0  0  0  Fall Risk Category Calculator 3 0 0 0 2  Patient at Risk for Falls Due to History of fall(s);Impaired balance/gait History of fall(s);Impaired balance/gait History of fall(s);Impaired balance/gait History of fall(s);Impaired balance/gait History of fall(s);Impaired balance/gait;Impaired mobility  Fall risk Follow up Falls evaluation completed Falls evaluation completed Falls evaluation completed Falls evaluation completed Falls  evaluation completed;Education provided;Falls prevention discussed     Data saved with a previous flowsheet row definition   Functional Status Survey:    Vitals:   01/07/25 1029  BP: 120/67  Pulse: 99  Temp: 97.6 F (36.4 C)  SpO2: 95%  Weight: 166 lb 9.6 oz (75.6 kg)  Height: 5' 5 (1.651 m)   Body mass index is 27.72 kg/m. Physical Exam  Labs reviewed: Recent Labs    08/04/24 1854 08/05/24 0303 08/06/24 0308 08/14/24 0000  NA 131* 139 141 144  K 4.0 4.3 3.6 2.7*  CL 98 108 109 101  CO2 23 21* 19* 33*  GLUCOSE 137* 162* 178*  --   BUN 18 16 21  22*  CREATININE 1.49* 0.92 0.98 0.9  CALCIUM  8.7* 8.5* 8.5* 8.8  MG 2.1 1.8 2.1  --   PHOS  --  3.0  --   --    Recent Labs    08/04/24 1854 08/05/24 0303 08/14/24 0000  AST 25 25 15   ALT 17 15 15   ALKPHOS 89 74 70  BILITOT 1.2 0.7  --   PROT 7.2 6.3*  --   ALBUMIN  3.2* 2.5* 3.4*   Recent Labs    08/06/24 0308 08/07/24 0304 08/08/24 0812 08/14/24 0000  WBC 18.3* 19.2* 15.4* 9.7  NEUTROABS 16.9* 17.3* 11.2*  --   HGB 8.9* 9.5* 10.1* 10.8*  HCT 30.5* 30.8* 33.0* 34*  MCV 94.1 93.6 92.7  --   PLT 214 228 245 321   Lab Results  Component Value Date   TSH 1.777 08/04/2024   Lab Results  Component Value Date   HGBA1C 6.5 08/14/2024   Lab Results  Component Value Date   CHOL 116 08/14/2024   HDL 38 08/14/2024   LDLCALC 57 08/14/2024   TRIG 126 08/14/2024   CHOLHDL 2.6 04/06/2020    Significant Diagnostic Results in last 30 days:  No results found.  Assessment/Plan There are no diagnoses linked to this encounter.   Family/ staff Communication: ***  Labs/tests ordered:  ***       [1]  Allergies Allergen Reactions   Adhesive [Tape] Rash   Celebrex [Celecoxib] Hives   Ciprofibrate Nausea Only   Cymbalta [Duloxetine Hcl] Swelling   Gabitril [Tiagabine] Swelling   Lyrica [Pregabalin] Swelling   Neurontin [Gabapentin] Swelling   Nexium  [Esomeprazole ] Rash   Nsaids Rash   Shrimp  [Shellfish Allergy] Anaphylaxis    Per patient shrimp only   Azactam  [Aztreonam ]     Hand swelling    Azelastine  Hcl     Rash    Ciprofloxacin  Other (See Comments)    dizziness   Claritin  [Loratadine ]     Irritability Nervousness    Methotrexate  And Trimetrexate    Nasacort  [Triamcinolone ]     Dizzy    Olopatadine  Other (See Comments)    Pain and lethargy    Other    Sulfamethizole Other (See Comments)    unknown   Zantac [Ranitidine Hcl] Other (See Comments)    unknown   Claritin -D 12 Hour [Loratadine -Pseudoephedrine Er] Anxiety   Keflex  [Cephalexin ] Nausea And Vomiting    Tolerated Ancef    Penicillins Rash    Injection site reaction. Tolerated cefepime  in past. Also reports tolerating a penicillin infusion after this initial rxn ~20 yrs ago.  Has patient had a PCN reaction causing immediate rash, facial/tongue/throat swelling, SOB or lightheadedness with hypotension: No Has patient had a PCN reaction causing severe rash involving mucus membranes or skin necrosis: No Has patient had a PCN reaction that required hospitalization No Has patient had a PCN reaction occurring within the last 10 years: No     Sulfa Antibiotics Nausea And Vomiting   "

## 2025-01-07 NOTE — Assessment & Plan Note (Signed)
 R ankle, stable, on allopurinol. Uric 6.5 02/05/24

## 2025-01-07 NOTE — Assessment & Plan Note (Signed)
 Hx of  Vit B12 po, Vit B12 235 06/20/21

## 2025-01-07 NOTE — Assessment & Plan Note (Signed)
 takes Atorvastatin, LDL 77 06/05/23

## 2025-01-07 NOTE — Assessment & Plan Note (Signed)
 Hgb a1c 6.5 12/21/23,  prediabetic, steroid induced.

## 2025-01-07 NOTE — Assessment & Plan Note (Signed)
"   off acid reducer,  Hgb 10.1 08/08/24 "

## 2025-01-07 NOTE — Assessment & Plan Note (Signed)
heart rate is in control. 

## 2025-01-07 NOTE — Assessment & Plan Note (Signed)
 RA/fibromyalgia, taking steroids, Tylenol ,  Tramadol , TSH 1.777 08/04/24

## 2025-01-07 NOTE — Progress Notes (Unsigned)
 " Location:   [facility] Room Number: AL925-A Place of Service:  ALF (13) Provider: Larwance Simmone Cape NP  Deral Schellenberg X, NP  Patient Care Team: Kohana Amble X, [name]  Extended Emergency Contact Information Primary Emergency Contact: Debrew,Jacqueline  [place] Home Phone: [phone] Mobile Phone: [phone] Relation: Daughter  Code Status:  DNR Goals of care: Advanced Directive information    08/12/2024    9:50 AM  Advanced Directives  Does Patient Have a Medical Advance Directive? Yes  Type of Estate Agent of [place];Living will;Out of facility DNR (pink MOST or yellow form)  Does patient want to make changes to medical advance directive? No - Patient declined  Copy of Healthcare Power of Attorney in Chart? Yes - validated most recent copy scanned in chart (See row information)     Chief Complaint  Patient presents with   Medication management of chronic issues    Routine    HPI:  Pt is a 89 y.o. female seen today for medical management of chronic diseases.    Chronic diastolic heart failure, DOE, on and off wheezing, cough, occasional yellow phlegm in am. 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  Diclofenac , MRI completed, Neurosurgery consulted, not a good surgical candidate              Hgb a1c 6.5 12/21/23,  prediabetic, steroid induced.              mostly the  right 3rd, 4th, 5th finger contractures Hypokalemia,  on Kcl             Hx of sinus tachycardia, heart rate is in control.              Hx of aute sigmoid diverticulitis with pneumoperitoneum, f/u Dr. Stevie, diarrhea on and off, negative C-diff in the past.              COPD/Asthma Epinephrine  prn, Allegra , Singulair , DuoNeb             RA/fibromyalgia, taking steroids, Tylenol ,  Tramadol , TSH 1.777 08/04/24              GERD, off acid reducer,  Hgb 10.1 08/08/24             Venous insufficiency, takes Torsemide, Kcl.             OP Rheumatology, off Alendronate , on Vit D, t score -1.8 06/06/21,  11/06/2023 declined DEXA             Hyperlipidemia, takes Atorvastatin , LDL 77 06/05/23             Vit B12 deficiency, Hx of  Vit B12 po, Vit B12 235 06/20/21             CT left adnexa cyst 11/2020, MRI 05/08/21,  05/22/22 renal US  shoed No evidence of renal mass or hydronephrosis.             Gout, R ankle, stable, on allopurinol. Uric 6.5 02/05/24             Hx of DVT, venous US  BLE was negative for DVT while in hospital. Off Eliquis .              HTN, on Amlodipine . Bun/creat 21/0.98 08/06/24             Constipation, taking Colace.  Past Medical History:  Diagnosis Date   Adrenal failure    Allergy will provide list   Arthritis    Asthma    Cancer (HCC)    Cataract    Closed nondisplaced fracture of fifth right metatarsal bone 09/18/2017   COPD (chronic obstructive pulmonary disease) (HCC)    Diverticulitis    Per patient   E coli bacteremia    Fibromyalgia 2008   GERD (gastroesophageal reflux disease) 12/18/2016   HCAP (healthcare-associated pneumonia) 02/03/2018   Hyperlipidemia 10/13/2021   Osteoporosis    RA (rheumatoid arthritis) (HCC)    Recurrent upper respiratory infection (URI)    Sepsis due to urinary tract infection (HCC) 01/18/2018   Urticaria    Past Surgical History:  Procedure Laterality Date   BACK SURGERY     BILATERAL CARPAL TUNNEL RELEASE  2005   right and left   CATARACT EXTRACTION, BILATERAL  2004   right and left   CERVICAL FUSION  2011,2010,2008   2 disks   EYE SURGERY  2004   R and left cateracts   HEEL SPUR SURGERY  2004   lower back fusion  2011   Fusion of 3-4 and 4-5 lower back   RADIOFREQUENCY ABLATION  2020   ROTATOR CUFF REPAIR  [id number]   SQUAMOUS CELL CARCINOMA EXCISION     TONSILLECTOMY AND ADENOIDECTOMY  1947   TOTAL  SHOULDER ARTHROPLASTY      Allergies[1]  Allergies as of 01/07/2025       Reactions   Adhesive [tape] Rash   Celebrex [celecoxib] Hives   Ciprofibrate Nausea Only   Cymbalta [duloxetine Hcl] Swelling   Gabitril [tiagabine] Swelling   Lyrica [pregabalin] Swelling   Neurontin [gabapentin] Swelling   Nexium  [esomeprazole ] Rash   Nsaids Rash   Shrimp [shellfish Allergy] Anaphylaxis   Per patient shrimp only   Azactam  [aztreonam ]    Hand swelling    Azelastine  Hcl    Rash    Ciprofloxacin  Other (See Comments)   dizziness   Claritin  [loratadine ]    Irritability Nervousness    Methotrexate  And Trimetrexate    Nasacort  [triamcinolone ]    Dizzy    Olopatadine  Other (See Comments)   Pain and lethargy    Other    Sulfamethizole Other (See Comments)   unknown   Zantac [ranitidine Hcl] Other (See Comments)   unknown   Claritin -d 12 Hour [loratadine -pseudoephedrine Er] Anxiety   Keflex  [cephalexin ] Nausea And Vomiting   Tolerated Ancef    Penicillins Rash   Injection site reaction. Tolerated cefepime  in past. Also reports tolerating a penicillin infusion after this initial rxn ~20 yrs ago.  Has patient had a PCN reaction causing immediate rash, facial/tongue/throat swelling, SOB or lightheadedness with hypotension: No Has patient had a PCN reaction causing severe rash involving mucus membranes or skin necrosis: No Has patient had a PCN reaction that required hospitalization No Has patient had a PCN reaction occurring within the last 10 years: No   Sulfa Antibiotics Nausea And Vomiting        Medication List        Accurate as of January 07, 2025 11:59 PM. If you have any questions, ask your nurse or doctor.          acetaminophen  325 MG tablet Commonly known as: TYLENOL  Take 650 mg by mouth 3 (three) times daily.   allopurinol 100 MG tablet Commonly known as: ZYLOPRIM Take 100 mg by mouth daily at 12 noon.   amLODipine  5 MG tablet Commonly  known as:  NORVASC  Take 5 mg by mouth daily at 12 noon.   atorvastatin  20 MG tablet Commonly known as: LIPITOR TAKE 1 TABLET BY MOUTH EVERY DAY   Biotin  5 MG Tbdp Take 1 tablet (5 mg total) by mouth daily.   cholecalciferol  25 MCG (1000 UNIT) tablet Commonly known as: VITAMIN D3 Take 1,000 Units by mouth daily at 12 noon.   cycloSPORINE  0.05 % ophthalmic emulsion Commonly known as: RESTASIS  Place 1 drop into both eyes 2 (two) times daily.   D-Mannose 500 MG Caps Take 500 mg by mouth daily.   diclofenac  Sodium 1 % Gel Commonly known as: VOLTAREN  APPLY 2 TO 4 GRAMS TOPICALLY TO AFFECTED JOINTS UP TO 4 TIMES DAILY   docusate sodium  100 MG capsule Commonly known as: Colace Take 1 capsule (100 mg total) by mouth 2 (two) times daily.   fexofenadine  60 MG tablet Commonly known as: ALLEGRA  Take 60 mg by mouth daily.   ipratropium-albuterol  0.5-2.5 (3) MG/3ML Soln Commonly known as: DUONEB Take 3 mLs by nebulization as needed.   lansoprazole  30 MG capsule Commonly known as: PREVACID  TAKE 1 CAPSULE BY MOUTH ONCE DAILY AT NOON   lidocaine  4 % Place 1 patch onto the skin daily. Apply to right ankle   montelukast  10 MG tablet Commonly known as: SINGULAIR  TAKE 1 TABLET BY MOUTH EVERYDAY AT BEDTIME   ondansetron  4 MG tablet Commonly known as: ZOFRAN  Take 4 mg by mouth as needed for nausea or vomiting.   potassium chloride  SA 20 MEQ tablet Commonly known as: KLOR-CON  M Take 40 mEq by mouth daily. Give 40 mEq by mouth one time a day for Hypokalemia   predniSONE  5 MG tablet Commonly known as: DELTASONE  Take 5 mg by mouth daily at 12 noon.   saccharomyces boulardii 250 MG capsule Commonly known as: FLORASTOR Take 250 mg by mouth 2 (two) times daily.   Torsemide 40 MG Tabs Take 40 mg by mouth daily at 12 noon.   traMADol  100 MG 24 hr tablet Commonly known as: ULTRAM -ER Take 1 tablet (100 mg total) by mouth 3 (three) times daily.        Review of Systems  Constitutional:   Negative for appetite change, fatigue and fever.  HENT:  Negative for congestion, sinus pressure and trouble swallowing.   Eyes:  Negative for visual disturbance.  Respiratory:  Positive for cough and shortness of breath. Negative for chest tightness and wheezing.        DOE, occasional yellow phlegm in am, hacking cough.   Cardiovascular:  Negative for leg swelling.  Gastrointestinal:  Negative for abdominal pain and constipation.  Genitourinary:  Negative for dysuria, frequency and urgency.       Incontinent of urine.   Musculoskeletal:  Positive for arthralgias, back pain, gait problem and myalgias.       Right lower back hip pain, travels down to the right leg.  Pain/aches in thights The most contractures the R 3rd, 4th, 5th fingers.   Skin:  Negative for color change.  Neurological:  Negative for speech difficulty, weakness and headaches.  Psychiatric/Behavioral:  Negative for confusion and sleep disturbance. The patient is not nervous/anxious.     Immunization History  Administered Date(s) Administered   Fluad Quad(high Dose 65+) 09/30/2020   INFLUENZA, HIGH DOSE SEASONAL PF 09/24/2015, 08/24/2016, 08/16/2018, 09/08/2019, 09/24/2020, 10/04/2021, 09/12/2023, 09/16/2024   Influenza,inj,Quad PF,6+ Mos 08/11/2016   Influenza-Unspecified 10/04/2012, 08/19/2013, 08/28/2014, 11/12/2017, 10/05/2022   Moderna Covid-19 Vaccine Bivalent Booster 2yrs &  up 10/04/2021, 09/26/2023   Moderna Sars-Covid-2 Vaccination 12/15/2019, 01/12/2020, 10/19/2020   PNEUMOCOCCAL CONJUGATE-20 10/04/2021   PPD Test 03/01/2018   Pneumococcal Conjugate-13 09/13/2015, 06/26/2017   Pneumococcal Polysaccharide-23 12/11/2009   Tdap 04/04/2023   Unspecified SARS-COV-2 Vaccination 10/06/2024   Zoster Recombinant(Shingrix) 01/28/2019, 09/02/2019   Pertinent  Health Maintenance Due  Topic Date Due   Influenza Vaccine  Completed   Bone Density Scan  Completed      02/19/2023    2:29 PM 03/06/2023   11:57 AM  03/13/2023   10:48 AM 03/22/2023   10:53 AM 01/02/2025    3:39 PM  Fall Risk  Falls in the past year? 1 0 0 0 1  Was there an injury with Fall? 1  0  0  0  0  Fall Risk Category Calculator 3 0 0 0 2  Patient at Risk for Falls Due to History of fall(s);Impaired balance/gait History of fall(s);Impaired balance/gait History of fall(s);Impaired balance/gait History of fall(s);Impaired balance/gait History of fall(s);Impaired balance/gait;Impaired mobility  Fall risk Follow up Falls evaluation completed Falls evaluation completed Falls evaluation completed Falls evaluation completed Falls evaluation completed;Education provided;Falls prevention discussed     Data saved with a previous flowsheet row definition   Functional Status Survey:    Vitals:   01/07/25 1029  BP: 120/67  Pulse: 99  Temp: 97.6 F (36.4 C)  SpO2: 95%  Weight: 166 lb 9.6 oz (75.6 kg)  Height: 5' 5 (1.651 m)   Body mass index is 27.72 kg/m. Physical Exam Vitals and nursing note reviewed.  Constitutional:      Appearance: Normal appearance.  HENT:     Head: Normocephalic and atraumatic.     Nose: Nose normal.     Mouth/Throat:     Mouth: Mucous membranes are moist.  Eyes:     Extraocular Movements: Extraocular movements intact.     Conjunctiva/sclera: Conjunctivae normal.     Pupils: Pupils are equal, round, and reactive to light.  Cardiovascular:     Rate and Rhythm: Normal rate and regular rhythm.     Heart sounds: No murmur heard. Pulmonary:     Effort: Pulmonary effort is normal.     Breath sounds: No rales.     Comments: Decreased air entry  Abdominal:     General: Bowel sounds are normal.     Palpations: Abdomen is soft.     Tenderness: There is no abdominal tenderness.  Musculoskeletal:     Cervical back: Normal range of motion and neck supple.     Right lower leg: No edema.     Left lower leg: No edema.  Skin:    General: Skin is warm and dry.     Comments: Lots of moles. Chronic erythema  venous insufficiency skin changes BLE  Neurological:     General: No focal deficit present.     Mental Status: She is alert and oriented to person, place, and time. Mental status is at baseline.     Gait: Gait abnormal.  Psychiatric:        Mood and Affect: Mood normal.        Behavior: Behavior normal.        Thought Content: Thought content normal.        Judgment: Judgment normal.     Labs reviewed: Recent Labs    08/04/24 1854 08/05/24 0303 08/06/24 0308 08/14/24 0000  NA 131* 139 141 144  K 4.0 4.3 3.6 2.7*  CL 98 108 109 101  CO2  23 21* 19* 33*  GLUCOSE 137* 162* 178*  --   BUN 18 16 21  22*  CREATININE 1.49* 0.92 0.98 0.9  CALCIUM  8.7* 8.5* 8.5* 8.8  MG 2.1 1.8 2.1  --   PHOS  --  3.0  --   --    Recent Labs    08/04/24 1854 08/05/24 0303 08/14/24 0000  AST 25 25 15   ALT 17 15 15   ALKPHOS 89 74 70  BILITOT 1.2 0.7  --   PROT 7.2 6.3*  --   ALBUMIN  3.2* 2.5* 3.4*   Recent Labs    08/06/24 0308 08/07/24 0304 08/08/24 0812 08/14/24 0000  WBC 18.3* 19.2* 15.4* 9.7  NEUTROABS 16.9* 17.3* 11.2*  --   HGB 8.9* 9.5* 10.1* 10.8*  HCT 30.5* 30.8* 33.0* 34*  MCV 94.1 93.6 92.7  --   PLT 214 228 245 321   Lab Results  Component Value Date   TSH 1.777 08/04/2024   Lab Results  Component Value Date   HGBA1C 6.5 08/14/2024   Lab Results  Component Value Date   CHOL 116 08/14/2024   HDL 38 08/14/2024   LDLCALC 57 08/14/2024   TRIG 126 08/14/2024   CHOLHDL 2.6 04/06/2020    Significant Diagnostic Results in last 30 days:  No results found.  Assessment/Plan  Chronic diastolic CHF (congestive heart failure) (HCC) DOE, on and off wheezing, cough, occasional yellow phlegm in am. 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  Diclofenac , MRI completed, Neurosurgery consulted,  not a good surgical candidate   Prediabetes  Hgb a1c 6.5 12/21/23,  prediabetic, steroid induced.   Hypokalemia Taking Kcl  Sinus tachycardia heart rate is in control.   COPD (chronic obstructive pulmonary disease) (HCC) COPD/Asthma Epinephrine  prn, Allegra , Singulair , DuoNeb  Rheumatoid arthritis (HCC) RA/fibromyalgia, taking steroids, Tylenol ,  Tramadol , TSH 1.777 08/04/24  GERD (gastroesophageal reflux disease)  off acid reducer,  Hgb 10.1 08/08/24  Age-related osteoporosis without current pathological fracture OP Rheumatology, off Alendronate , on Vit D, t score -1.8 06/06/21,  11/06/2023 declined DEXA  HLD (hyperlipidemia)  takes Atorvastatin , LDL 77 06/05/23  B12 deficiency Hx of  Vit B12 po, Vit B12 235 06/20/21  Gout R ankle, stable, on allopurinol. Uric 6.5 02/05/24  Essential hypertension Blood pressure is controlled on Amlodipine . Bun/creat 21/0.98 08/06/24   Family/ staff Communication: plan of care reviewed with the patient and charge nurse.   Labs/tests ordered:  CBC/diff, CMP/eGFR, TSH, lipids, Vit B12, Vit D, Hgb A1c.       [1]  Allergies Allergen Reactions   Adhesive [Tape] Rash   Celebrex [Celecoxib] Hives   Ciprofibrate Nausea Only   Cymbalta [Duloxetine Hcl] Swelling   Gabitril [Tiagabine] Swelling   Lyrica [Pregabalin] Swelling   Neurontin [Gabapentin] Swelling   Nexium  [Esomeprazole ] Rash   Nsaids Rash   Shrimp [Shellfish Allergy] Anaphylaxis    Per patient shrimp only   Azactam  [Aztreonam ]     Hand swelling    Azelastine  Hcl     Rash    Ciprofloxacin  Other (See Comments)    dizziness   Claritin  [Loratadine ]     Irritability Nervousness    Methotrexate  And Trimetrexate    Nasacort  [Triamcinolone ]     Dizzy    Olopatadine  Other (See Comments)    Pain and lethargy    Other    Sulfamethizole Other (See  Comments)    unknown   Zantac [Ranitidine Hcl] Other (See Comments)    unknown   Claritin -D 12 Hour [Loratadine -Pseudoephedrine  Er] Anxiety   Keflex  [Cephalexin ] Nausea And Vomiting    Tolerated Ancef    Penicillins Rash    Injection site reaction. Tolerated cefepime  in past. Also reports tolerating a penicillin infusion after this initial rxn ~20 yrs ago.  Has patient had a PCN reaction causing immediate rash, facial/tongue/throat swelling, SOB or lightheadedness with hypotension: No Has patient had a PCN reaction causing severe rash involving mucus membranes or skin necrosis: No Has patient had a PCN reaction that required hospitalization No Has patient had a PCN reaction occurring within the last 10 years: No     Sulfa Antibiotics Nausea And Vomiting   "

## 2025-01-07 NOTE — Assessment & Plan Note (Signed)
 OP Rheumatology, off Alendronate , on Vit D, t score -1.8 06/06/21,  11/06/2023 declined DEXA

## 2025-01-08 ENCOUNTER — Encounter: Payer: Self-pay | Admitting: Nurse Practitioner
